# Patient Record
Sex: Male | Born: 1985 | Race: White | Hispanic: No | Marital: Single | Smoking: Never smoker
Health system: Southern US, Community
[De-identification: ages and names within clinical notes are randomized; demographics above are authoritative.]

## PROBLEM LIST (undated history)

## (undated) DIAGNOSIS — E785 Hyperlipidemia, unspecified: Secondary | ICD-10-CM

## (undated) DIAGNOSIS — R011 Cardiac murmur, unspecified: Secondary | ICD-10-CM

## (undated) DIAGNOSIS — E039 Hypothyroidism, unspecified: Secondary | ICD-10-CM

## (undated) DIAGNOSIS — T8859XA Other complications of anesthesia, initial encounter: Secondary | ICD-10-CM

## (undated) DIAGNOSIS — E876 Hypokalemia: Secondary | ICD-10-CM

## (undated) DIAGNOSIS — J309 Allergic rhinitis, unspecified: Secondary | ICD-10-CM

## (undated) DIAGNOSIS — R569 Unspecified convulsions: Secondary | ICD-10-CM

## (undated) DIAGNOSIS — R51 Headache: Secondary | ICD-10-CM

## (undated) DIAGNOSIS — I4891 Unspecified atrial fibrillation: Secondary | ICD-10-CM

## (undated) DIAGNOSIS — H5060 Mechanical strabismus, unspecified: Secondary | ICD-10-CM

## (undated) DIAGNOSIS — R519 Headache, unspecified: Secondary | ICD-10-CM

## (undated) DIAGNOSIS — F70 Mild intellectual disabilities: Secondary | ICD-10-CM

## (undated) DIAGNOSIS — G4730 Sleep apnea, unspecified: Secondary | ICD-10-CM

## (undated) DIAGNOSIS — E348 Other specified endocrine disorders: Secondary | ICD-10-CM

## (undated) DIAGNOSIS — I059 Rheumatic mitral valve disease, unspecified: Secondary | ICD-10-CM

## (undated) DIAGNOSIS — T4145XA Adverse effect of unspecified anesthetic, initial encounter: Secondary | ICD-10-CM

## (undated) DIAGNOSIS — I8290 Acute embolism and thrombosis of unspecified vein: Secondary | ICD-10-CM

## (undated) HISTORY — DX: Mild intellectual disabilities: F70

## (undated) HISTORY — DX: Headache: R51

## (undated) HISTORY — DX: Unspecified atrial fibrillation: I48.91

## (undated) HISTORY — DX: Other specified endocrine disorders: E34.8

## (undated) HISTORY — DX: Rheumatic mitral valve disease, unspecified: I05.9

## (undated) HISTORY — DX: Headache, unspecified: R51.9

## (undated) HISTORY — PX: PORTACATH PLACEMENT: SHX2246

## (undated) HISTORY — DX: Hypothyroidism, unspecified: E03.9

## (undated) HISTORY — PX: EYE SURGERY: SHX253

## (undated) HISTORY — DX: Hyperlipidemia, unspecified: E78.5

## (undated) HISTORY — DX: Mechanical strabismus, unspecified: H50.60

## (undated) HISTORY — DX: Unspecified convulsions: R56.9

## (undated) HISTORY — DX: Allergic rhinitis, unspecified: J30.9

## (undated) HISTORY — PX: ANKLE SURGERY: SHX546

## (undated) HISTORY — PX: CATARACT EXTRACTION: SUR2

## (undated) HISTORY — PX: IMPLANTATION VAGAL NERVE STIMULATOR: SUR692

---
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
## 2006-01-01 ENCOUNTER — Observation Stay (HOSPITAL_COMMUNITY): Admission: EM | Admit: 2006-01-01 | Discharge: 2006-01-02 | Payer: Self-pay | Admitting: Emergency Medicine

## 2006-01-21 ENCOUNTER — Encounter: Payer: Self-pay | Admitting: Emergency Medicine

## 2006-03-25 ENCOUNTER — Inpatient Hospital Stay (HOSPITAL_COMMUNITY): Admission: EM | Admit: 2006-03-25 | Discharge: 2006-03-30 | Payer: Self-pay | Admitting: Emergency Medicine

## 2006-08-06 ENCOUNTER — Inpatient Hospital Stay (HOSPITAL_COMMUNITY): Admission: EM | Admit: 2006-08-06 | Discharge: 2006-08-07 | Payer: Self-pay | Admitting: Emergency Medicine

## 2006-09-25 ENCOUNTER — Ambulatory Visit: Payer: Self-pay | Admitting: Pediatrics

## 2006-09-25 ENCOUNTER — Inpatient Hospital Stay (HOSPITAL_COMMUNITY): Admission: AD | Admit: 2006-09-25 | Discharge: 2006-09-28 | Payer: Self-pay | Admitting: Pediatrics

## 2006-09-30 ENCOUNTER — Encounter: Payer: Self-pay | Admitting: Internal Medicine

## 2006-10-12 ENCOUNTER — Ambulatory Visit (HOSPITAL_COMMUNITY): Admission: RE | Admit: 2006-10-12 | Discharge: 2006-10-12 | Payer: Self-pay | Admitting: Pediatrics

## 2006-12-02 ENCOUNTER — Encounter: Payer: Self-pay | Admitting: Internal Medicine

## 2006-12-21 ENCOUNTER — Encounter: Payer: Self-pay | Admitting: Internal Medicine

## 2006-12-24 ENCOUNTER — Encounter: Payer: Self-pay | Admitting: Internal Medicine

## 2007-03-15 ENCOUNTER — Inpatient Hospital Stay (HOSPITAL_COMMUNITY): Admission: EM | Admit: 2007-03-15 | Discharge: 2007-03-16 | Payer: Self-pay | Admitting: Emergency Medicine

## 2007-03-16 ENCOUNTER — Inpatient Hospital Stay (HOSPITAL_COMMUNITY): Admission: AD | Admit: 2007-03-16 | Discharge: 2007-03-18 | Payer: Self-pay | Admitting: Pediatrics

## 2007-05-05 ENCOUNTER — Encounter: Payer: Self-pay | Admitting: Internal Medicine

## 2007-06-22 ENCOUNTER — Ambulatory Visit: Payer: Self-pay | Admitting: Internal Medicine

## 2007-06-22 DIAGNOSIS — I059 Rheumatic mitral valve disease, unspecified: Secondary | ICD-10-CM | POA: Insufficient documentation

## 2007-06-22 DIAGNOSIS — G40909 Epilepsy, unspecified, not intractable, without status epilepticus: Secondary | ICD-10-CM

## 2007-06-22 DIAGNOSIS — R569 Unspecified convulsions: Secondary | ICD-10-CM

## 2007-06-22 DIAGNOSIS — E039 Hypothyroidism, unspecified: Secondary | ICD-10-CM

## 2007-06-22 HISTORY — DX: Rheumatic mitral valve disease, unspecified: I05.9

## 2007-06-22 HISTORY — DX: Hypothyroidism, unspecified: E03.9

## 2007-06-22 HISTORY — DX: Unspecified convulsions: R56.9

## 2007-07-29 ENCOUNTER — Ambulatory Visit: Payer: Self-pay | Admitting: Internal Medicine

## 2007-07-29 DIAGNOSIS — J40 Bronchitis, not specified as acute or chronic: Secondary | ICD-10-CM

## 2007-09-17 ENCOUNTER — Telehealth: Payer: Self-pay | Admitting: Internal Medicine

## 2007-09-17 ENCOUNTER — Ambulatory Visit: Payer: Self-pay | Admitting: Internal Medicine

## 2007-09-19 ENCOUNTER — Emergency Department (HOSPITAL_COMMUNITY): Admission: EM | Admit: 2007-09-19 | Discharge: 2007-09-19 | Payer: Self-pay | Admitting: Emergency Medicine

## 2007-09-20 ENCOUNTER — Ambulatory Visit: Payer: Self-pay | Admitting: Family Medicine

## 2007-09-20 ENCOUNTER — Inpatient Hospital Stay (HOSPITAL_COMMUNITY): Admission: AD | Admit: 2007-09-20 | Discharge: 2007-09-27 | Payer: Self-pay | Admitting: Family Medicine

## 2007-09-20 ENCOUNTER — Encounter: Payer: Self-pay | Admitting: Family Medicine

## 2007-09-25 ENCOUNTER — Ambulatory Visit: Payer: Self-pay | Admitting: Infectious Disease

## 2007-09-29 ENCOUNTER — Encounter: Payer: Self-pay | Admitting: Internal Medicine

## 2007-10-04 ENCOUNTER — Encounter: Payer: Self-pay | Admitting: Internal Medicine

## 2008-01-31 ENCOUNTER — Encounter: Admission: RE | Admit: 2008-01-31 | Discharge: 2008-01-31 | Payer: Self-pay | Admitting: Pediatrics

## 2008-03-02 ENCOUNTER — Encounter: Payer: Self-pay | Admitting: Internal Medicine

## 2008-04-20 ENCOUNTER — Inpatient Hospital Stay (HOSPITAL_COMMUNITY): Admission: EM | Admit: 2008-04-20 | Discharge: 2008-04-22 | Payer: Self-pay | Admitting: Emergency Medicine

## 2008-04-20 ENCOUNTER — Encounter: Payer: Self-pay | Admitting: Internal Medicine

## 2008-04-20 ENCOUNTER — Ambulatory Visit: Payer: Self-pay | Admitting: Pediatrics

## 2008-04-27 ENCOUNTER — Telehealth: Payer: Self-pay | Admitting: Internal Medicine

## 2008-04-27 ENCOUNTER — Encounter: Payer: Self-pay | Admitting: Internal Medicine

## 2008-07-11 ENCOUNTER — Ambulatory Visit: Payer: Self-pay | Admitting: Internal Medicine

## 2008-07-13 ENCOUNTER — Emergency Department (HOSPITAL_COMMUNITY): Admission: EM | Admit: 2008-07-13 | Discharge: 2008-07-14 | Payer: Self-pay | Admitting: Emergency Medicine

## 2008-11-18 ENCOUNTER — Inpatient Hospital Stay (HOSPITAL_COMMUNITY): Admission: EM | Admit: 2008-11-18 | Discharge: 2008-11-20 | Payer: Self-pay | Admitting: Emergency Medicine

## 2008-11-18 ENCOUNTER — Ambulatory Visit: Payer: Self-pay | Admitting: Internal Medicine

## 2008-12-07 ENCOUNTER — Ambulatory Visit: Payer: Self-pay | Admitting: Internal Medicine

## 2008-12-07 ENCOUNTER — Encounter: Payer: Self-pay | Admitting: Family Medicine

## 2008-12-18 ENCOUNTER — Ambulatory Visit: Payer: Self-pay | Admitting: Internal Medicine

## 2008-12-18 DIAGNOSIS — J069 Acute upper respiratory infection, unspecified: Secondary | ICD-10-CM | POA: Insufficient documentation

## 2009-01-01 ENCOUNTER — Ambulatory Visit: Payer: Self-pay | Admitting: Internal Medicine

## 2009-03-05 ENCOUNTER — Telehealth: Payer: Self-pay | Admitting: Internal Medicine

## 2009-04-16 ENCOUNTER — Ambulatory Visit: Payer: Self-pay | Admitting: Internal Medicine

## 2009-04-16 ENCOUNTER — Inpatient Hospital Stay (HOSPITAL_COMMUNITY): Admission: EM | Admit: 2009-04-16 | Discharge: 2009-04-21 | Payer: Self-pay | Admitting: Emergency Medicine

## 2009-04-16 ENCOUNTER — Ambulatory Visit: Payer: Self-pay | Admitting: Cardiology

## 2009-04-20 ENCOUNTER — Encounter (INDEPENDENT_AMBULATORY_CARE_PROVIDER_SITE_OTHER): Payer: Self-pay | Admitting: Internal Medicine

## 2009-04-22 ENCOUNTER — Telehealth: Payer: Self-pay | Admitting: Internal Medicine

## 2009-06-29 ENCOUNTER — Encounter: Payer: Self-pay | Admitting: Internal Medicine

## 2009-09-11 ENCOUNTER — Telehealth: Payer: Self-pay | Admitting: Internal Medicine

## 2009-09-11 ENCOUNTER — Inpatient Hospital Stay (HOSPITAL_COMMUNITY): Admission: EM | Admit: 2009-09-11 | Discharge: 2009-09-15 | Payer: Self-pay | Admitting: Emergency Medicine

## 2009-09-11 ENCOUNTER — Ambulatory Visit: Payer: Self-pay | Admitting: Internal Medicine

## 2009-09-11 LAB — CONVERTED CEMR LAB: Carbamazepine Lvl: 11.3 ug/mL (ref 4.0–12.0)

## 2009-09-12 ENCOUNTER — Telehealth: Payer: Self-pay | Admitting: Internal Medicine

## 2009-09-12 ENCOUNTER — Ambulatory Visit: Payer: Self-pay | Admitting: Internal Medicine

## 2009-09-12 LAB — CONVERTED CEMR LAB
ALT: 24 units/L (ref 0–53)
AST: 23 units/L (ref 0–37)
BUN: 9 mg/dL (ref 6–23)
Basophils Relative: 0.6 % (ref 0.0–3.0)
Bilirubin, Direct: 0 mg/dL (ref 0.0–0.3)
Calcium: 8.8 mg/dL (ref 8.4–10.5)
Chloride: 109 meq/L (ref 96–112)
Creatinine, Ser: 0.9 mg/dL (ref 0.4–1.5)
Eosinophils Relative: 2.4 % (ref 0.0–5.0)
GFR calc non Af Amer: 112.13 mL/min (ref >60)
Lymphocytes Relative: 24.5 % (ref 12.0–46.0)
Monocytes Absolute: 0.4 10*3/uL (ref 0.1–1.0)
Monocytes Relative: 6 % (ref 3.0–12.0)
Neutrophils Relative %: 66.5 % (ref 43.0–77.0)
Platelets: 275 10*3/uL (ref 150.0–400.0)
RBC: 4.65 M/uL (ref 4.22–5.81)
TSH: 0.04 microintl units/mL — ABNORMAL LOW (ref 0.35–5.50)
Total Bilirubin: 0.7 mg/dL (ref 0.3–1.2)
WBC: 6.5 10*3/uL (ref 4.5–10.5)

## 2009-09-13 ENCOUNTER — Encounter: Payer: Self-pay | Admitting: Internal Medicine

## 2009-09-27 ENCOUNTER — Ambulatory Visit: Payer: Self-pay | Admitting: Internal Medicine

## 2009-10-26 ENCOUNTER — Telehealth: Payer: Self-pay | Admitting: Internal Medicine

## 2009-10-30 ENCOUNTER — Ambulatory Visit: Payer: Self-pay | Admitting: Internal Medicine

## 2009-10-30 DIAGNOSIS — K140 Glossitis: Secondary | ICD-10-CM

## 2009-12-13 ENCOUNTER — Observation Stay (HOSPITAL_COMMUNITY): Admission: EM | Admit: 2009-12-13 | Discharge: 2009-12-13 | Payer: Self-pay | Admitting: Emergency Medicine

## 2009-12-26 ENCOUNTER — Ambulatory Visit: Payer: Self-pay | Admitting: Family Medicine

## 2009-12-26 DIAGNOSIS — J309 Allergic rhinitis, unspecified: Secondary | ICD-10-CM

## 2009-12-26 HISTORY — DX: Allergic rhinitis, unspecified: J30.9

## 2010-01-03 ENCOUNTER — Ambulatory Visit: Payer: Self-pay | Admitting: Internal Medicine

## 2010-01-23 ENCOUNTER — Encounter: Payer: Self-pay | Admitting: Internal Medicine

## 2010-02-07 ENCOUNTER — Encounter: Payer: Self-pay | Admitting: Internal Medicine

## 2010-02-08 ENCOUNTER — Telehealth: Payer: Self-pay

## 2010-02-08 ENCOUNTER — Telehealth: Payer: Self-pay | Admitting: Internal Medicine

## 2010-04-09 ENCOUNTER — Ambulatory Visit: Payer: Self-pay | Admitting: Internal Medicine

## 2010-04-09 DIAGNOSIS — R197 Diarrhea, unspecified: Secondary | ICD-10-CM

## 2010-08-06 ENCOUNTER — Ambulatory Visit: Payer: Self-pay | Admitting: Internal Medicine

## 2010-08-06 DIAGNOSIS — R040 Epistaxis: Secondary | ICD-10-CM

## 2010-09-10 ENCOUNTER — Telehealth: Payer: Self-pay | Admitting: Internal Medicine

## 2010-09-11 ENCOUNTER — Ambulatory Visit: Payer: Self-pay | Admitting: Internal Medicine

## 2010-10-17 ENCOUNTER — Telehealth: Payer: Self-pay | Admitting: Internal Medicine

## 2010-10-22 NOTE — Medication Information (Signed)
Summary: Refill Authorization Request for Clarinex  Refill Authorization Request for Clarinex   Imported By: Maryln Gottron 02/25/2010 12:24:35  _____________________________________________________________________  External Attachment:    Type:   Image     Comment:   External Document

## 2010-10-22 NOTE — Assessment & Plan Note (Signed)
Summary: SORE ON TONGUE // RS   Vital Signs:  Patient profile:   25 year old male Weight:      87 pounds Temp:     97.9 degrees F oral BP sitting:   100 / 64  (left arm) Cuff size:   regular  Vitals Entered By: Raechel Ache, RN (October 30, 2009 3:58 PM) CC: c/o sores in mouth , diarrhea / blood in stool resolving from last week   CC:  c/o sores in mouth  and diarrhea / blood in stool resolving from last week.  History of Present Illness: 25 year old patient with history of a chronic convulsive disorder, who is in today with a chief complaint of discomfort involving his tongue.  He is had some recent diarrhea, which has resolved.  He is hypothyroidism, and is scheduled for a follow-up TSH.  His seizure disorder has been stable but still has frequent seizures throughout the week.  No fever.    Allergies: 1)  ! Depakote (Divalproex Sodium) 2)  ! * Antihistamines 3)  ! Dilantin Infatabs (Phenytoin)  Past History:  Past Medical History: Reviewed history from 09/27/2009 and no changes required. 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 [name] insertion vagal nerve stimulator insertion status post VNS battery placement March 2007  Review of Systems  The patient denies anorexia, fever, weight loss, weight gain, vision loss, decreased hearing, hoarseness, chest pain, syncope, dyspnea on exertion, peripheral edema, prolonged cough, headaches, hemoptysis, abdominal pain, melena, hematochezia, severe indigestion/heartburn, hematuria, incontinence, genital sores, muscle weakness, suspicious skin lesions, transient blindness, difficulty walking, depression, unusual weight change, abnormal bleeding, enlarged lymph nodes, angioedema, breast masses, and testicular masses.     Physical Exam  General:  underweight appearing.  alert no distress.  Low-normal blood pressure Head:  Normocephalic and atraumatic without obvious abnormalities. No apparent alopecia or balding. Eyes:  No corneal or conjunctival inflammation noted. EOMI. Perrla. Funduscopic exam benign, without hemorrhages, exudates or papilledema. Vision grossly normal. Ears:  External ear exam shows no significant lesions or deformities.  Otoscopic examination reveals clear canals, tympanic membranes are intact bilaterally without bulging, retraction, inflammation or discharge. Hearing is grossly normal bilaterally. Mouth:  glossitis.  oropharynx clear Chest Wall:  No deformities, masses, tenderness or gynecomastia noted. Lungs:  Normal respiratory effort, chest expands symmetrically. Lungs are clear to auscultation, no crackles or wheezes. Heart:  Normal rate and regular rhythm. S1 and S2 normal without gallop, murmur, click, rub or other extra sounds. Abdomen:  Bowel sounds positive,abdomen soft and non-tender without masses, organomegaly or hernias noted.   Impression & Recommendations:  Problem # 1:  HYPOTHYROIDISM (ICD-244.9)  His updated medication list for this problem includes:    Synthroid 50 Mcg Tabs (Levothyroxine sodium) ..... One daily    His updated medication list for this problem includes:    Synthroid 50 Mcg Tabs (Levothyroxine sodium) ..... One daily  Orders: Venipuncture (16109) TLB-TSH (Thyroid Stimulating Hormone) (84443-TSH)  Problem # 2:  GLOSSITIS (ICD-529.0)  Complete Medication List: 1)  Tegretol 250 Mg Tabs (carbamazepine)  .Marland Kitchen.. 1 three times a day 2)  Topamax 150 Mg Tabs (topiramate)  .Marland Kitchen.. 1 three times a day 3)  Clarinex 5 Mg Tabs (Desloratadine) .Marland Kitchen.. 1 once daily 4)  Klor-con 20 Meq Pack (Potassium chloride) .Marland Kitchen.. 1 qid 5)  K-phos 500 Mg Tabs (  Potassium phosphate monobasic) .Marland Kitchen.. 1 qid 6)  Promethazine Hcl 25 Mg Tabs (Promethazine hcl) .... One every 6 hours as  needed for nausea 7)  Hydrocodone-acetaminophen 5-500 Mg Tabs (Hydrocodone-acetaminophen) .... One every 6 hours as needed for pain or cough 8)  Ranitidine Hcl 150 Mg Caps (Ranitidine hcl) .Marland Kitchen.. 1 once daily 9)  Synthroid 50 Mcg Tabs (Levothyroxine sodium) .... One daily 10)  Vimpat 50 Mg Tabs (Lacosamide) .... Bid  Patient Instructions: 1)  Please schedule a follow-up appointment in 3 months.

## 2010-10-22 NOTE — Assessment & Plan Note (Signed)
Summary: pt had a seizure this a.m./pt fell and hit nose/nose is swoll...   Vital Signs:  Patient profile:   25 year old male Weight:      97 pounds Temp:     97.4 degrees F oral BP sitting:   88 / 60  (right arm) Cuff size:   regular  Vitals Entered By: Duard Brady LPN (August 06, 2010 3:38 PM) CC: seizure this Am  Is Patient Diabetic? No Flu Vaccine Consent Questions     Do you have a history of severe allergic reactions to this vaccine? no    Any prior history of allergic reactions to egg and/or gelatin? no    Do you have a sensitivity to the preservative Thimersol? no    Do you have a past history of Guillan-Barre Syndrome? no    Do you currently have an acute febrile illness? no    Have you ever had a severe reaction to latex? no    Vaccine information given and explained to patient? yes    Are you currently pregnant? no    Lot Number:[id number]   Exp Date:03/22/2011   Site Given  Left Deltoid IM   CC:  seizure this Am .  History of Present Illness: 25 year old patient who has a chronic convulsive disorder, who had an acute seizure at approximately 815 earlier this morning.  He sustained some trauma to his nose at that time and there was brief self-limited epistaxis.  He complains of some minimal swelling and pain involving the bridge of his nose. He has a history of hypothyroidism and allergic rhinitis, which have been stable.  He remains on Synthroid 50 micrograms daily  Allergies: 1)  ! Depakote (Divalproex Sodium) 2)  ! * Antihistamines 3)  ! Dilantin Infatabs (Phenytoin)  Past History:  Past Medical History: Reviewed history from 01/03/2010 and no changes required. 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 No apparent alopecia or balding. Eyes:  No corneal or conjunctival inflammation noted. EOMI. Perrla. Funduscopic exam benign, without hemorrhages, exudates or papilledema. Vision grossly normal. Ears:  External ear exam shows no significant lesions or deformities.  Otoscopic examination reveals clear canals, tympanic membranes are intact bilaterally without bulging, retraction, inflammation or discharge. Hearing is grossly normal bilaterally. Nose:  soft tissue swelling and early ecchymosis over the bridge of the nose.  No misalignment; no tenderness with gentle palpation over the bridge of the nose Mouth:  Oral mucosa and oropharynx without lesions or exudates.  Teeth in good repair. Neck:  No deformities, masses, or tenderness noted. Lungs:  Normal respiratory effort, chest expands symmetrically. Lungs are clear to auscultation, no crackles or wheezes. Heart:  Normal rate and regular rhythm. S1 and S2 normal without gallop, murmur, click, rub or other extra sounds.   Impression & Recommendations:  Problem # 1:  EPISTAXIS (ICD-784.7) this has resolved; the nasal trauma appears mild doubt nasal fracture.  Will clinically observe at this time  Problem # 2:  SEIZURE DISORDER (ICD-780.39)  His updated medication list for this problem includes:    Vimpat 10 Mg/ml Soln (Lacosamide) .Marland Kitchen... 2 and 1/2 cc three times a day  Complete Medication List: 1)  Tegretol 250 Mg Tabs (carbamazepine)  .Marland Kitchen.. 1 three times a day 2)  Topamax  150 Mg Tabs (topiramate)  .Marland Kitchen.. 1 three times a day 3)  Clarinex 5 Mg Tabs (Desloratadine) .Marland Kitchen.. 1 once daily 4)  Klor-con 20 Meq Pack (Potassium chloride) .Marland Kitchen.. 1 qid 5)  K-phos 500 Mg Tabs (Potassium phosphate monobasic) .Marland Kitchen.. 1 qid 6)  Promethazine Hcl 25 Mg Tabs (Promethazine hcl) .... One every 6 hours as needed for nausea 7)  Hydrocodone-acetaminophen 5-500 Mg Tabs  (Hydrocodone-acetaminophen) .... One every 6 hours as needed for pain or cough 8)  Ranitidine Hcl 150 Mg Caps (Ranitidine hcl) .Marland Kitchen.. 1 once daily 9)  Synthroid 50 Mcg Tabs (Levothyroxine sodium) .... One daily 10)  Vimpat 10 Mg/ml Soln (Lacosamide) .... 2 and 1/2 cc three times a day 11)  Singulair 10 Mg Tabs (Montelukast sodium) .... One by mouth once daily 12)  Fluticasone Propionate 50 Mcg/act Susp (Fluticasone propionate) .... One puff each nares once daily  Other Orders: Admin 1st Vaccine (16109) Flu Vaccine 34yrs + (60454)  Patient Instructions: 1)  apply ice to the bridge of the nose until bedtime 2)  call if increasing pain or significant swelling or difficulty breathing through the nose 3)  Please schedule a follow-up appointment in 3 months.   Orders Added: 1)  Admin 1st Vaccine [90471] 2)  Flu Vaccine 25yrs + [90658] 3)  Est. Patient Level IV [09811]

## 2010-10-22 NOTE — Assessment & Plan Note (Signed)
Summary: hospital follow up/cjr   Vital Signs:  Patient profile:   25 year old male Weight:      87 pounds Pulse rate:   88 / minute Pulse rhythm:   regular BP sitting:   88 / 60  (left arm) Cuff size:   regular  Vitals Entered By: Raechel Ache, RN (September 27, 2009 1:22 PM) [facility]: Hospital f/u. Feels well. Is Patient Diabetic? No   CC:  Hospital f/u. Feels well.Marland Kitchen  History of Present Illness: 25 year old patient has a history of a chronic seizure disorder.  He was discharged from the hospital on Christmas day following recurrent status epilepticus.  Since his discharge he has had 3 major seizures.  He is scheduled for neurology follow-up tomorrow morning.  TSH was suppressed.  Prior to his [facility].  This was repeated during his hospital admission and was still low.  His mother states that on her lower Synthroid dose in the past.  He did not do quite as well.  Allergies: 1)  ! Depakote (Divalproex Sodium) 2)  ! * Antihistamines 3)  ! 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 no family history of seizures family history migraines, diabetes, and hypertension  Review of Systems  The patient denies anorexia, fever, weight loss, weight gain, vision loss, decreased hearing, hoarseness, chest pain, syncope, dyspnea on exertion, peripheral edema, prolonged cough, headaches, hemoptysis, abdominal pain, melena, hematochezia, severe indigestion/heartburn, hematuria, incontinence, genital sores, muscle weakness, suspicious skin lesions, transient blindness, difficulty walking, depression, unusual weight change, abnormal bleeding, enlarged lymph nodes, angioedema, breast masses, and testicular masses.    Physical  Exam  General:  underweight appearing.  alert no distress; underweight appearing.   Head:  Normocephalic and atraumatic without obvious abnormalities. No apparent alopecia or balding. Eyes:  No corneal or conjunctival inflammation noted. EOMI. Perrla. Funduscopic exam benign, without hemorrhages, exudates or papilledema. Vision grossly normal. Mouth:  Oral mucosa and oropharynx without lesions or exudates.  Teeth in good repair. Neck:  No deformities, masses, or tenderness noted. Lungs:  Normal respiratory effort, chest expands symmetrically. Lungs are clear to auscultation, no crackles or wheezes. Heart:  grade 3/6 systolic murmur Abdomen:  Bowel sounds positive,abdomen soft and non-tender without masses, organomegaly or hernias noted.   Impression & Recommendations:  Problem # 1:  HYPOTHYROIDISM (ICD-244.9)  The following medications were removed from the medication list:    Synthroid 75 Mcg Tabs (Levothyroxine sodium) .Marland Kitchen... 1 once daily His updated medication list for this problem includes:    Synthroid 50 Mcg Tabs (Levothyroxine sodium) ..... One daily will decrease his Synthroid to 50 micrograms daily, and repeat a TSH in 6 weeks  The following medications were removed from the medication list:    Synthroid 75 Mcg Tabs (Levothyroxine sodium) .Marland Kitchen... 1 once daily His updated medication list for this problem includes:    Synthroid 50 Mcg Tabs (Levothyroxine sodium) ..... One daily  Problem # 2:  MITRAL VALVE PROLAPSE (ICD-424.0)  Problem # 3:  SEIZURE DISORDER (ICD-780.39)  The following medications were removed from the medication list:    Vimpat 100 Mg Tabs (Lacosamide) .Marland Kitchen... 1 two times a day  The following medications were removed from the medication list:    Vimpat 100 Mg Tabs (Lacosamide) .Marland Kitchen... 1 two times a  day  Complete Medication List: 1)  Tegretol 250 Mg Tabs (carbamazepine)  .Marland Kitchen.. 1 three times a day 2)  Topamax 150 Mg Tabs (topiramate)  .Marland Kitchen.. 1 three times a day 3)   Clarinex 5 Mg Tabs (Desloratadine) .Marland Kitchen.. 1 once daily 4)  Klor-con 20 Meq Pack (Potassium chloride) .Marland Kitchen.. 1 qid 5)  K-phos 500 Mg Tabs (Potassium phosphate monobasic) .Marland Kitchen.. 1 qid 6)  Promethazine Hcl 25 Mg Tabs (Promethazine hcl) .... One every 6 hours as needed for nausea 7)  Hydrocodone-acetaminophen 5-500 Mg Tabs (Hydrocodone-acetaminophen) .... One every 6 hours as needed for pain or cough 8)  Ranitidine Hcl 150 Mg Caps (Ranitidine hcl) .Marland Kitchen.. 1 once daily 9)  Synthroid 50 Mcg Tabs (Levothyroxine sodium) .... One daily  Patient Instructions: 1)  Please schedule a follow-up appointment in 3 months. 2)  neurology follow up in the morning as scheduled 3)  TSH in 6 weeks (244.9) Prescriptions: SYNTHROID 50 MCG TABS (LEVOTHYROXINE SODIUM) one daily  #90 x 6   Entered and Authorized by:   Gordy Savers  MD   Signed by:   Gordy Savers  MD on 09/27/2009   Method used:   Print then Give to Patient   RxID:   [id number] SYNTHROID 50 MCG TABS (LEVOTHYROXINE SODIUM) one daily  #90 x 6   Entered and Authorized by:   Gordy Savers  MD   Signed by:   Gordy Savers  MD on 09/27/2009   Method used:   Electronically to        [facility]  [phone]* (retail)       [address]       [place], [place]  [address]       Ph: [phone] or [phone]       Fax: [phone]   RxID:   [id number]

## 2010-10-22 NOTE — Assessment & Plan Note (Signed)
Summary: 3 month roa//lh   Vital Signs:  Patient profile:   25 year old male Weight:      90 pounds Temp:     97.6 degrees F oral BP sitting:   100 / 70  (right arm) Cuff size:   regular  Vitals Entered By: Duard Brady LPN (January 03, 2010 1:04 PM) CC: 3 mos rov - doing ok  still with allergy sx - saw Dr. Docia Furl 2 wks ago Is Patient Diabetic? No   CC:  3 mos rov - doing ok  still with allergy sx - saw Dr. Docia Furl 2 wks ago.  History of Present Illness: 25 year old patient who has a history of allergic rhinitis.  Has a history of chronic seizure disorder, hypothyroidism.  He has been reasonably stable.  His main complaint today are allergy related mainly sinus congestion and rhinorrhea.  He has been on Clarinex for some time and Singulair recently added to his regimen.  He has been resistant to trying nasal steroids in the past  Allergies: 1)  ! Depakote (Divalproex Sodium) 2)  ! * Antihistamines 3)  ! 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  low-normal blood pressureunderweight appearing.   Head:  Normocephalic and atraumatic without obvious abnormalities. No apparent alopecia or balding. Eyes:  No corneal or conjunctival inflammation noted. EOMI. Perrla. Funduscopic exam benign, without hemorrhages, exudates or papilledema. Vision grossly normal. Ears:  External ear exam shows no significant lesions or deformities.  Otoscopic examination reveals clear canals, tympanic membranes are intact bilaterally without bulging, retraction, inflammation or discharge. Hearing is grossly normal bilaterally. Nose:  External nasal examination shows no deformity or inflammation. Nasal mucosa are pink and moist without lesions or  exudates. Mouth:  Oral mucosa and oropharynx without lesions or exudates.  Teeth in good repair. Neck:  No deformities, masses, or tenderness noted. Lungs:  Normal respiratory effort, chest expands symmetrically. Lungs are clear to auscultation, no crackles or wheezes. Heart:  Normal rate and regular rhythm. S1 and S2 normal without gallop, murmur, click, rub or other extra sounds. Abdomen:  Bowel sounds positive,abdomen soft and non-tender without masses, organomegaly or hernias noted.   Impression & Recommendations:  Problem # 1:  ALLERGIC RHINITIS (ICD-477.9)  His updated medication list for this problem includes:    Clarinex 5 Mg Tabs (Desloratadine) .Marland Kitchen... 1 once daily    Promethazine Hcl 25 Mg Tabs (Promethazine hcl) ..... One every 6 hours as needed for nausea    Fluticasone Propionate 50 Mcg/act Susp (Fluticasone propionate) ..... One puff each nares once daily  His updated medication list for this problem includes:    Clarinex 5 Mg Tabs (Desloratadine) .Marland Kitchen... 1 once daily    Promethazine Hcl 25 Mg Tabs (Promethazine hcl) ..... One every 6 hours as needed for nausea    Fluticasone Propionate 50 Mcg/act Susp (Fluticasone propionate) ..... One puff each nares once daily  Problem # 2:  HYPOTHYROIDISM (ICD-244.9)  His updated medication list for this problem includes:    Synthroid 50 Mcg Tabs (Levothyroxine sodium) ..... One daily  His updated medication list for this problem includes:    Synthroid 50 Mcg Tabs (Levothyroxine sodium) ..... One daily  Problem # 3:  SEIZURE DISORDER (ICD-780.39)  His updated medication list for this problem includes:    Vimpat  10 Mg/ml Soln (Lacosamide) .Marland Kitchen... 2 and 1/2 cc three times a day  His updated medication list for this problem includes:    Vimpat 10 Mg/ml Soln (Lacosamide) .Marland Kitchen... 2 and 1/2 cc three times a day  Complete Medication List: 1)  Tegretol 250 Mg Tabs (carbamazepine)  .Marland Kitchen.. 1 three times a day 2)  Topamax 150 Mg Tabs  (topiramate)  .Marland Kitchen.. 1 three times a day 3)  Clarinex 5 Mg Tabs (Desloratadine) .Marland Kitchen.. 1 once daily 4)  Klor-con 20 Meq Pack (Potassium chloride) .Marland Kitchen.. 1 qid 5)  K-phos 500 Mg Tabs (Potassium phosphate monobasic) .Marland Kitchen.. 1 qid 6)  Promethazine Hcl 25 Mg Tabs (Promethazine hcl) .... One every 6 hours as needed for nausea 7)  Hydrocodone-acetaminophen 5-500 Mg Tabs (Hydrocodone-acetaminophen) .... One every 6 hours as needed for pain or cough 8)  Ranitidine Hcl 150 Mg Caps (Ranitidine hcl) .Marland Kitchen.. 1 once daily 9)  Synthroid 50 Mcg Tabs (Levothyroxine sodium) .... One daily 10)  Vimpat 10 Mg/ml Soln (Lacosamide) .... 2 and 1/2 cc three times a day 11)  Singulair 10 Mg Tabs (Montelukast sodium) .... One by mouth once daily 12)  Fluticasone Propionate 50 Mcg/act Susp (Fluticasone propionate) .... One puff each nares once daily  Patient Instructions: 1)  trial of Zyrtec or Allegra 2)  fluticasone nasal spray once daily 3)  Please schedule a follow-up appointment in 3 months. Prescriptions: FLUTICASONE PROPIONATE 50 MCG/ACT SUSP (FLUTICASONE PROPIONATE) one puff each nares once daily  #3 x 3   Entered and Authorized by:   Gordy Savers  MD   Signed by:   Gordy Savers  MD on 01/03/2010   Method used:   Print then Give to Patient   RxID:   [id number]

## 2010-10-22 NOTE — Progress Notes (Signed)
  Phone Note Call from Patient   Caller: Mom Call For: Gordy Savers  MD Summary of Call: c/o diarrhea x1wk, today very frequent, stool black and noting blood. Instructed mom to place on clear liquids until I can speak with Dr. Amador Cunas. cell [phone]  KIK  Follow-up for Phone Call        suggest ROV today or sat am clinic if unimproved Follow-up by: Gordy Savers  MD,  October 26, 2009 12:46 PM  Additional Follow-up for Phone Call Additional follow up Details #1::        rtc to mom - per Dr. Amador Cunas - saturaday clinic if no improvement - do clear liquids then brat diet. To Er if increase in notied blood or fever .Sat clinic location , hours and ph number given. Verbalized understanding.  KIK

## 2010-10-22 NOTE — Assessment & Plan Note (Signed)
Summary: ALLERGIES? // RS   Vital Signs:  Patient profile:   25 year old male Temp:     98.3 degrees F oral BP sitting:   110 / 70  (left arm) Cuff size:   regular  Vitals Entered By: Sid Falcon LPN (December 26, 452 2:45 PM) CC: runny nose, sneezing, coughing X 2 weeks   History of Present Illness: Patient seen with 3-4 history of nasal congestion, sneezing, intermittent cough but no fever. No purulent nasal secretions. Question of seasonal allergies in the spring. Using Clarinex 5 mg daily without much relief.  No hx asthma.  Allergies: 1)  ! Depakote (Divalproex Sodium) 2)  ! * Antihistamines 3)  ! 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 Ears:  External ear exam shows no significant lesions or deformities.  Otoscopic examination reveals clear canals, tympanic membranes are intact bilaterally without bulging, retraction, inflammation or discharge. Hearing is grossly normal bilaterally. Nose:  clear nasal mucus bilaterally Mouth:  Oral mucosa and oropharynx without lesions or exudates.  Teeth in good repair. Neck:  No deformities, masses, or tenderness noted. Lungs:  Normal respiratory effort, chest expands symmetrically. Lungs are clear to auscultation, no crackles or wheezes. Heart:  normal rate and regular rhythm.     Impression & Recommendations:  Problem # 1:  ALLERGIC RHINITIS (ICD-477.9) start Singulair (samples given) and they will try alternative antihistamine such as Allegra His updated medication list for this problem includes:    Clarinex 5 Mg Tabs (Desloratadine) .Marland Kitchen... 1 once daily    Promethazine Hcl 25 Mg Tabs  (Promethazine hcl) ..... One every 6 hours as needed for nausea  Complete Medication List: 1)  Tegretol 250 Mg Tabs (carbamazepine)  .Marland Kitchen.. 1 three times a day 2)  Topamax 150 Mg Tabs (topiramate)  .Marland Kitchen.. 1 three times a day 3)  Clarinex 5 Mg Tabs (Desloratadine) .Marland Kitchen.. 1 once daily 4)  Klor-con 20 Meq Pack (Potassium chloride) .Marland Kitchen.. 1 qid 5)  K-phos 500 Mg Tabs (Potassium phosphate monobasic) .Marland Kitchen.. 1 qid 6)  Promethazine Hcl 25 Mg Tabs (Promethazine hcl) .... One every 6 hours as needed for nausea 7)  Hydrocodone-acetaminophen 5-500 Mg Tabs (Hydrocodone-acetaminophen) .... One every 6 hours as needed for pain or cough 8)  Ranitidine Hcl 150 Mg Caps (Ranitidine hcl) .Marland Kitchen.. 1 once daily 9)  Synthroid 50 Mcg Tabs (Levothyroxine sodium) .... One daily 10)  Vimpat 10 Mg/ml Soln (Lacosamide) .... 2 and 1/2 cc three times a day 11)  Singulair 10 Mg Tabs (Montelukast sodium) .... One by mouth once daily  Patient Instructions: 1)  Start Singulair 10 mg one tablet daily 2)  Consider alternative antihistamine such as Allegra or Zyrtec one tablet daily

## 2010-10-22 NOTE — Progress Notes (Signed)
Summary: trail and failure claritin and zyrtec - resubmit preauth  Phone Note Call from Patient   Caller: Mom Call For: Marcus Savers  MD Reason for Call: Talk to Nurse Summary of Call: [name] has been on claritin before rx'd clarinex - also is currently using Zyrtec without relief until we can get back on clarinex. So ,trial and failure on both Claritin and Zyrtec.  Initial call taken by: Duard Brady LPN,  Feb 08, 2010 4:26 PM

## 2010-10-22 NOTE — Progress Notes (Signed)
Summary: info needed for clarinex auth  Phone Note Outgoing Call   Call placed by: Duard Brady LPN,  Feb 08, 2010 11:37 AM Call placed to: mom Summary of Call: need pre auth for clarinex - need to know if he has every tried and failed zyrtec of claritin otc , if not he needs to try and if not working , call so we can document failure. Ok to leave msg on mach. KIK Initial call taken by: Duard Brady LPN,  Feb 08, 2010 11:38 AM

## 2010-10-22 NOTE — Assessment & Plan Note (Signed)
Summary: ? diarrhea//ccm   Vital Signs:  Patient profile:   25 year old male Weight:      93 pounds Temp:     97.8 degrees F oral BP sitting:   100 / 66  (left arm) Cuff size:   regular  Vitals Entered By: Duard Brady LPN (April 09, 2010 11:39 AM) CC: c/o diarrhea Is Patient Diabetic? No   CC:  c/o diarrhea.  History of Present Illness: 25 year old patient, who presents with a two to 3 week history of episodic loose, watery stool.  There is been no nausea, anorexia, or vomiting.  He denies any abdominal pain.  There's been no new medications or change in his diet.  Symptoms seem paroxysmal.  He has had no bowel movement at midday today.  Review his chart reveals a weight gain of 3 pounds since his last visit here.  His seizure disorder has been stable.  He has a history of mitral valve prolapse and treated hypothyroidism.  Preventive Screening-Counseling & Management  Alcohol-Tobacco     Smoking Status: never  Allergies: 1)  ! Depakote (Divalproex Sodium) 2)  ! * Antihistamines 3)  ! Dilantin Infatabs (Phenytoin)  Past History:  Past Medical History: Reviewed history from 01/03/2010 and no changes required. 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) Hypothyroidism Allergic rhinitis  Social History: Smoking Status:  never  Review of Systems  The patient denies anorexia, fever, weight loss, weight gain, vision loss, decreased hearing, hoarseness, chest pain, syncope, dyspnea on exertion, peripheral edema, prolonged cough, headaches, hemoptysis, abdominal pain, melena, hematochezia, severe indigestion/heartburn, hematuria, incontinence, genital sores, muscle weakness, suspicious skin lesions, transient blindness, difficulty walking, depression, unusual weight change, abnormal bleeding, enlarged lymph nodes, angioedema, breast masses, and testicular masses.     Physical Exam  General:  alert appropriate, no distress.  Blood pressure 100/60.  No tachycardia Head:  Normocephalic and atraumatic without obvious abnormalities. No apparent alopecia or balding. Eyes:  No corneal or conjunctival inflammation noted. EOMI. Perrla. Funduscopic exam benign, without hemorrhages, exudates or papilledema. Vision grossly normal. Mouth:  Oral mucosa and oropharynx without lesions or exudates.  Teeth in good repair. Neck:  No deformities, masses, or tenderness noted. Lungs:  Normal respiratory effort, chest expands symmetrically. Lungs are clear to auscultation, no crackles or wheezes. Heart:  grade 2 to 3/6 systolic murmur Abdomen:  soft flat, nontender, active bowel sounds.  No guarding or rebound.  No distention   Impression & Recommendations:  Problem # 1:  DIARRHEA (ICD-787.91)  Problem # 2:  HYPOTHYROIDISM (ICD-244.9)  His updated medication list for this problem includes:    Synthroid 50 Mcg Tabs (Levothyroxine sodium) ..... One daily  Problem # 3:  SEIZURE DISORDER (ICD-780.39)  His updated medication list for this problem includes:    Vimpat 10 Mg/ml Soln (Lacosamide) .Marland Kitchen... 2 and 1/2 cc three times a day  Complete Medication List: 1)  Tegretol 250 Mg Tabs (carbamazepine)  .Marland Kitchen.. 1 three times a day 2)  Topamax 150 Mg Tabs (topiramate)  .Marland Kitchen.. 1 three times a day 3)  Clarinex 5 Mg Tabs (Desloratadine) .Marland Kitchen.. 1 once daily 4)  Klor-con 20 Meq Pack (Potassium chloride) .Marland Kitchen.. 1 qid 5)  K-phos 500 Mg Tabs (Potassium phosphate monobasic) .Marland Kitchen.. 1 qid 6)  Promethazine Hcl 25 Mg Tabs (Promethazine hcl) .... One every 6 hours as needed for nausea 7)  Hydrocodone-acetaminophen 5-500 Mg Tabs (Hydrocodone-acetaminophen) .... One every 6  hours as needed for pain or cough 8)  Ranitidine Hcl 150 Mg Caps (Ranitidine hcl) .Marland Kitchen.. 1 once daily 9)  Synthroid 50 Mcg Tabs (Levothyroxine sodium) .... One daily 10)  Vimpat 10 Mg/ml Soln (Lacosamide) .... 2 and 1/2 cc three  times a day 11)  Singulair 10 Mg Tabs (Montelukast sodium) .... One by mouth once daily 12)  Fluticasone Propionate 50 Mcg/act Susp (Fluticasone propionate) .... One puff each nares once daily  Patient Instructions: 1)  align- one daily 2)  high fiber diet 3)  Drink as much fluid as you can tolerate for the next few days.

## 2010-10-22 NOTE — Letter (Signed)
Summary: [facility]  [facility]   Imported By: Lennie Odor 03/06/2010 16:49:06  _____________________________________________________________________  External Attachment:    Type:   Image     Comment:   External Document

## 2010-10-22 NOTE — Letter (Signed)
Summary: [facility]  [facility]   Imported By: Maryln Gottron 02/04/2010 10:08:45  _____________________________________________________________________  External Attachment:    Type:   Image     Comment:   External Document

## 2010-10-24 NOTE — Progress Notes (Signed)
Summary: sore throat---wants ov  Phone Note Call from Patient Call back at Home Phone [phone] Call back at [phone]   Caller: Mom---triage vm Reason for Call: Talk to Nurse Complaint: Cough/Sore throat Summary of Call: has a cough and sore throat. no fever. wants ov today. Initial call taken by: Warnell Forester,  September 10, 2010 9:44 AM  Follow-up for Phone Call        spoke with mom - sore throat , no fever . appt made for tomorrow AM Follow-up by: Duard Brady LPN,  September 10, 2010 1:26 PM

## 2010-10-24 NOTE — Progress Notes (Signed)
Summary: synthriod refill  Phone Note Refill Request Message from:  Fax from Pharmacy on October 17, 2010 4:24 PM  Refills Requested: Medication #1:  SYNTHROID 50 MCG TABS one daily [facility]   Method Requested: Fax to Local Pharmacy Initial call taken by: Duard Brady LPN,  October 17, 2010 4:25 PM    Prescriptions: SYNTHROID 50 MCG TABS (LEVOTHYROXINE SODIUM) one daily  #90 x 3   Entered by:   Duard Brady LPN   Authorized by:   Gordy Savers  MD   Signed by:   Duard Brady LPN on 16/06/9603   Method used:   Faxed to ...       [facility]  [address]  [phone]* (retail)       [address]       [place], [place]  [address]       Ph: [phone] or [phone]       Fax: [phone]   RxID:   [id number]

## 2010-10-24 NOTE — Assessment & Plan Note (Signed)
Summary: sore throat   kik   Vital Signs:  Patient profile:   25 year old male Weight:      95 pounds BP sitting:   102 / 60  (right arm) Cuff size:   regular  Vitals Entered By: Duard Brady LPN (September 11, 2010 11:06 AM) CC: c/o sore throat , head congestion, nonproductive cough Is Patient Diabetic? No   CC:  c/o sore throat , head congestion, and nonproductive cough.  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  For the past few days,   he has had mild sore throat, cough, but no documented fever.  He has had seizures the past two mornings.  He has a history of treated hypothyroidism, allergic rhinitis.  Maintenance medications, including Clarinex, and fluticasone  Allergies: 1)  ! Depakote (Divalproex Sodium) 2)  ! * Antihistamines 3)  ! Dilantin Infatabs (Phenytoin)  Past History:  Past Medical History: Reviewed history from 01/03/2010 and no changes required. 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 [name] insertion vagal nerve stimulator insertion status post VNS battery placement March 2007  Family History: Reviewed history from 06/22/2007 and no changes required. no family history of seizures family history migraines, diabetes, and hypertension  Review of Systems       The patient complains of anorexia, hoarseness, and prolonged cough.  The patient denies fever, weight loss, weight gain, vision loss, decreased hearing, chest pain, syncope, dyspnea on exertion, peripheral edema, headaches, hemoptysis, abdominal pain, melena, hematochezia, severe indigestion/heartburn, hematuria, incontinence, genital sores, muscle weakness, suspicious skin  lesions, transient blindness, difficulty walking, depression, unusual weight change, abnormal bleeding, enlarged lymph nodes, angioedema, breast masses, and testicular masses.    Physical Exam  General:  underweight appearing.   normal.  Blood pressure, afebrile, no distress Head:  Normocephalic and atraumatic without obvious abnormalities. No apparent alopecia or balding. Eyes:  No corneal or conjunctival inflammation noted. EOMI. Perrla. Funduscopic exam benign, without hemorrhages, exudates or papilledema. Vision grossly normal. Ears:  External ear exam shows no significant lesions or deformities.  Otoscopic examination reveals clear canals, tympanic membranes are intact bilaterally without bulging, retraction, inflammation or discharge. Hearing is grossly normal bilaterally. Mouth:  Oral mucosa and oropharynx without lesions or exudates.  mild erythema only.  No exudate Neck:  No deformities, masses, or tenderness noted. Lungs:  Normal respiratory effort, chest expands symmetrically. Lungs are clear to auscultation, no crackles or wheezes.  O2 saturation 98% Heart:  Normal rate and regular rhythm. S1 and S2 normal without gallop, murmur, click, rub or other extra sounds.  no tachycardia Abdomen:  Bowel sounds positive,abdomen soft and non-tender without masses, organomegaly or hernias noted. Msk:  No deformity or scoliosis noted of thoracic or lumbar spine.   Pulses:  R and L carotid,radial,femoral,dorsalis pedis and posterior tibial pulses are full and equal bilaterally Extremities:  No clubbing, cyanosis, edema, or deformity noted with normal full range of motion of all joints.     Impression & Recommendations:  Problem # 1:  ALLERGIC RHINITIS (ICD-477.9)  His updated medication list for this problem includes:    Clarinex 5 Mg Tabs (Desloratadine) .Marland Kitchen... 1 once daily    Promethazine Hcl 25 Mg Tabs (Promethazine hcl) ..... One every 6 hours as needed  for nausea    Fluticasone  Propionate 50 Mcg/act Susp (Fluticasone propionate) ..... One puff each nares once daily  Problem # 2:  HYPOTHYROIDISM (ICD-244.9)  His updated medication list for this problem includes:    Synthroid 50 Mcg Tabs (Levothyroxine sodium) ..... One daily  Problem # 3:  PHYSICAL EXAMINATION (ICD-V70.0)  Problem # 4:  SEIZURE DISORDER (ICD-780.39)  His updated medication list for this problem includes:    Vimpat 10 Mg/ml Soln (Lacosamide) .Marland Kitchen... 2 and 1/2 cc three times a day  Complete Medication List: 1)  Tegretol 250 Mg Tabs (carbamazepine)  .Marland Kitchen.. 1 three times a day 2)  Topamax 150 Mg Tabs (topiramate)  .Marland Kitchen.. 1 three times a day 3)  Clarinex 5 Mg Tabs (Desloratadine) .Marland Kitchen.. 1 once daily 4)  Klor-con 20 Meq Pack (Potassium chloride) .Marland Kitchen.. 1 qid 5)  K-phos 500 Mg Tabs (Potassium phosphate monobasic) .Marland Kitchen.. 1 qid 6)  Promethazine Hcl 25 Mg Tabs (Promethazine hcl) .... One every 6 hours as needed for nausea 7)  Hydrocodone-acetaminophen 5-500 Mg Tabs (Hydrocodone-acetaminophen) .... One every 6 hours as needed for pain or cough 8)  Ranitidine Hcl 150 Mg Caps (Ranitidine hcl) .Marland Kitchen.. 1 once daily 9)  Synthroid 50 Mcg Tabs (Levothyroxine sodium) .... One daily 10)  Vimpat 10 Mg/ml Soln (Lacosamide) .... 2 and 1/2 cc three times a day 11)  Singulair 10 Mg Tabs (Montelukast sodium) .... One by mouth once daily 12)  Fluticasone Propionate 50 Mcg/act Susp (Fluticasone propionate) .... One puff each nares once daily  Patient Instructions: 1)  Get plenty of rest, drink lots of clear liquids, and use Tylenol or Ibuprofen for fever and comfort. Return in 7-10 days if you're not better:sooner if you're feeling worse. Prescriptions: HYDROCODONE-ACETAMINOPHEN 5-500 MG TABS (HYDROCODONE-ACETAMINOPHEN) one every 6 hours as needed for pain or cough  #50 x 0   Entered and Authorized by:   Gordy Savers  MD   Signed by:   Gordy Savers  MD on 09/11/2010   Method used:   Print then Give to Patient   RxID:    [id number]    Orders Added: 1)  Est. Patient Level IV [56213]

## 2010-11-13 ENCOUNTER — Encounter: Payer: Self-pay | Admitting: Family Medicine

## 2010-11-13 ENCOUNTER — Ambulatory Visit (INDEPENDENT_AMBULATORY_CARE_PROVIDER_SITE_OTHER): Payer: Medicaid Other | Admitting: Family Medicine

## 2010-11-13 ENCOUNTER — Ambulatory Visit (INDEPENDENT_AMBULATORY_CARE_PROVIDER_SITE_OTHER)
Admission: RE | Admit: 2010-11-13 | Discharge: 2010-11-13 | Disposition: A | Discharge status: Home / Self Care | Payer: Medicaid Other | Source: Ambulatory Visit | Attending: Family Medicine | Admitting: Family Medicine

## 2010-11-13 ENCOUNTER — Ambulatory Visit: Payer: Self-pay | Admitting: Family Medicine

## 2010-11-13 VITALS — BP 100/84 | Temp 98.0°F | Wt 98.0 lb

## 2010-11-13 DIAGNOSIS — S93409A Sprain of unspecified ligament of unspecified ankle, initial encounter: Secondary | ICD-10-CM

## 2010-11-13 DIAGNOSIS — H5060 Mechanical strabismus, unspecified: Secondary | ICD-10-CM | POA: Insufficient documentation

## 2010-11-13 DIAGNOSIS — L03119 Cellulitis of unspecified part of limb: Secondary | ICD-10-CM

## 2010-11-13 DIAGNOSIS — L02419 Cutaneous abscess of limb, unspecified: Secondary | ICD-10-CM

## 2010-11-13 MED ORDER — CEPHALEXIN 500 MG PO CAPS: 500.0000 mg | ORAL_CAPSULE | Freq: Three times a day (TID) | ORAL | Status: AC

## 2010-11-13 NOTE — Progress Notes (Signed)
  Subjective:    Patient ID: [name], male    DOB: [birth year], 25 y.o.   MRN: [id number]  HPI  Patient seen with left lateral ankle pain. Questionable history of sprain a few days ago. Also rubbed abrasion lateral ankle noted at L4-5 days ago. Two day history of some redness and swelling lateral ankle with slight warmth. No fever or chills. No drainage. Patient unable to give good history of possible prior injury but thinks he may have sprained ankle several days ago. Mild pain with weightbearing.   Review of Systems     Objective:   Physical Exam  patient is alert and cooperative to exam.  Chest clear to auscultation Heart regular rhythm and rate Left ankle reveals small abrasion about a 5 x 6 mm. 2-3 cm surrounding area of warmth , bowel edema , mild tenderness and erythema. No. A needs. He has some tenderness along the distal fibula as the swelling in this region. Patient has some deformity of the foot and ankle which is chronic       Assessment & Plan:   #1 cellulitis left ankle probably secondary to recent abrasion. #2 possible ankle sprain. Ankle edema maybe secondary to #1    start Keflex 500 mg 3 times a day for 10 days. Keep abrasion clean with soap and water. X-rays to rule out fracture. Followup with Dr. Kirtland Bouchard in one week.

## 2010-11-21 ENCOUNTER — Encounter: Payer: Self-pay | Admitting: Internal Medicine

## 2010-11-21 ENCOUNTER — Ambulatory Visit (INDEPENDENT_AMBULATORY_CARE_PROVIDER_SITE_OTHER): Payer: Medicaid Other | Admitting: Internal Medicine

## 2010-11-21 DIAGNOSIS — J309 Allergic rhinitis, unspecified: Secondary | ICD-10-CM

## 2010-11-21 DIAGNOSIS — R569 Unspecified convulsions: Secondary | ICD-10-CM

## 2010-11-21 DIAGNOSIS — E039 Hypothyroidism, unspecified: Secondary | ICD-10-CM

## 2010-11-21 NOTE — Progress Notes (Signed)
  Subjective:    Patient ID: [name], male    DOB: 15-Oct-1985, 25 y.o.   MRN: [id number]  HPI  25 year old patient who is seen today for followup. He has a history of a chronic seizure disorder and is followed closely by neurology. He has had a recent bout of left lateral ankle cellulitis that has nicely resolved. He has a history of allergic rhinitis which has been stable on his maintenance medications. He feels quite well today. He has a history of mitral valve prolapse.   Review of Systems  Constitutional: Negative for fever, chills, appetite change and fatigue.  HENT: Negative for hearing loss, ear pain, congestion, sore throat, trouble swallowing, neck stiffness, dental problem, voice change and tinnitus.   Eyes: Negative for pain, discharge and visual disturbance.  Respiratory: Negative for cough, chest tightness, wheezing and stridor.   Cardiovascular: Negative for chest pain, palpitations and leg swelling.  Gastrointestinal: Negative for nausea, vomiting, abdominal pain, diarrhea, constipation, blood in stool and abdominal distention.  Genitourinary: Negative for urgency, hematuria, flank pain, discharge, difficulty urinating and genital sores.  Musculoskeletal: Negative for myalgias, back pain, joint swelling, arthralgias and gait problem.  Skin: Negative for rash.  Neurological: Negative for dizziness, syncope, speech difficulty, weakness, numbness and headaches.  Hematological: Negative for adenopathy. Does not bruise/bleed easily.  Psychiatric/Behavioral: Negative for behavioral problems and dysphoric mood. The patient is not nervous/anxious.        Objective:   Physical Exam  Constitutional: He is oriented to person, place, and time. He appears well-developed.  HENT:  Head: Normocephalic.  Right Ear: External ear normal.  Left Ear: External ear normal.  Eyes: Conjunctivae and EOM are normal.  Neck: Normal range of motion.  Cardiovascular: Normal rate.   Murmur (  grade 3/6 systolic murmur loudest at the apex) heard. Pulmonary/Chest: Breath sounds normal.  Abdominal: Bowel sounds are normal.  Musculoskeletal: Normal range of motion. He exhibits no edema and no tenderness.  Neurological: He is alert and oriented to person, place, and time.  Skin:        Resolving erythema and slight crusting involving his left lateral ankle  Psychiatric: He has a normal mood and affect. His behavior is normal.          Assessment & Plan:   seizure disorder relatively stable  Allergic rhinitis  Status post  Cellulitis  Of  the left ankle

## 2010-11-21 NOTE — Patient Instructions (Addendum)
It is important that you exercise regularly, at least 20 minutes 3 to 4 times per week.  If you develop chest pain or shortness of breath seek  medical attention.  Return in 4 months for follow-up Take your antibiotic as prescribed until ALL of it is gone, but stop if you develop a rash, swelling, or any side effects of the medication.  Contact our office as soon as possible if  there are side effects of the medication.  Consider Podiatry referral

## 2010-12-16 LAB — DIFFERENTIAL
Basophils Absolute: 0 10*3/uL (ref 0.0–0.1)
Basophils Absolute: 0 10*3/uL (ref 0.0–0.1)
Basophils Relative: 0 % (ref 0–1)
Basophils Relative: 1 % (ref 0–1)
Lymphocytes Relative: 45 % (ref 12–46)
Monocytes Relative: 5 % (ref 3–12)
Neutro Abs: 2.3 10*3/uL (ref 1.7–7.7)
Neutro Abs: 2.9 10*3/uL (ref 1.7–7.7)
Neutrophils Relative %: 52 % (ref 43–77)
Neutrophils Relative %: 62 % (ref 43–77)

## 2010-12-16 LAB — COMPREHENSIVE METABOLIC PANEL
Albumin: 3.5 g/dL (ref 3.5–5.2)
Alkaline Phosphatase: 83 U/L (ref 39–117)
Alkaline Phosphatase: 93 U/L (ref 39–117)
BUN: 7 mg/dL (ref 6–23)
BUN: 8 mg/dL (ref 6–23)
CO2: 20 mEq/L (ref 19–32)
Chloride: 109 mEq/L (ref 96–112)
Creatinine, Ser: 0.74 mg/dL (ref 0.4–1.5)
GFR calc non Af Amer: >60 mL/min (ref >60)
Glucose, Bld: 103 mg/dL — ABNORMAL HIGH (ref 70–99)
Glucose, Bld: 133 mg/dL — ABNORMAL HIGH (ref 70–99)
Potassium: 3.1 mEq/L — ABNORMAL LOW (ref 3.5–5.1)
Total Bilirubin: 0.5 mg/dL (ref 0.3–1.2)
Total Protein: 6.4 g/dL (ref 6.0–8.3)

## 2010-12-16 LAB — MRSA PCR SCREENING: MRSA by PCR: NEGATIVE

## 2010-12-16 LAB — CBC
HCT: 33 % — ABNORMAL LOW (ref 39.0–52.0)
HCT: 34.9 % — ABNORMAL LOW (ref 39.0–52.0)
Hemoglobin: 11.3 g/dL — ABNORMAL LOW (ref 13.0–17.0)
Hemoglobin: 12.1 g/dL — ABNORMAL LOW (ref 13.0–17.0)
MCHC: 34.2 g/dL (ref 30.0–36.0)
MCV: 83.1 fL (ref 78.0–100.0)
Platelets: 251 10*3/uL (ref 150–400)
RDW: 13.2 % (ref 11.5–15.5)
WBC: 4.5 10*3/uL (ref 4.0–10.5)

## 2010-12-16 LAB — PROTIME-INR: Prothrombin Time: 14.3 seconds (ref 11.6–15.2)

## 2010-12-16 LAB — PHOSPHORUS: Phosphorus: 3.2 mg/dL (ref 2.3–4.6)

## 2010-12-16 LAB — CARBAMAZEPINE LEVEL, TOTAL: Carbamazepine Lvl: 12.1 ug/mL — ABNORMAL HIGH (ref 4.0–12.0)

## 2010-12-16 LAB — MAGNESIUM: Magnesium: 1.8 mg/dL (ref 1.5–2.5)

## 2010-12-16 LAB — TSH: TSH: 0.138 u[IU]/mL — ABNORMAL LOW (ref 0.350–4.500)

## 2010-12-23 LAB — COMPREHENSIVE METABOLIC PANEL
BUN: 8 mg/dL (ref 6–23)
Calcium: 8.8 mg/dL (ref 8.4–10.5)
GFR calc non Af Amer: >60 mL/min (ref >60)
Glucose, Bld: 102 mg/dL — ABNORMAL HIGH (ref 70–99)
Total Protein: 7 g/dL (ref 6.0–8.3)

## 2010-12-23 LAB — GLUCOSE, CAPILLARY
Glucose-Capillary: 100 mg/dL — ABNORMAL HIGH (ref 70–99)
Glucose-Capillary: 106 mg/dL — ABNORMAL HIGH (ref 70–99)
Glucose-Capillary: 109 mg/dL — ABNORMAL HIGH (ref 70–99)
Glucose-Capillary: 114 mg/dL — ABNORMAL HIGH (ref 70–99)
Glucose-Capillary: 117 mg/dL — ABNORMAL HIGH (ref 70–99)
Glucose-Capillary: 121 mg/dL — ABNORMAL HIGH (ref 70–99)
Glucose-Capillary: 140 mg/dL — ABNORMAL HIGH (ref 70–99)
Glucose-Capillary: 143 mg/dL — ABNORMAL HIGH (ref 70–99)
Glucose-Capillary: 79 mg/dL (ref 70–99)
Glucose-Capillary: 87 mg/dL (ref 70–99)
Glucose-Capillary: 93 mg/dL (ref 70–99)
Glucose-Capillary: 98 mg/dL (ref 70–99)
Glucose-Capillary: 99 mg/dL (ref 70–99)

## 2010-12-23 LAB — DIFFERENTIAL
Basophils Relative: 0 % (ref 0–1)
Lymphs Abs: 0.9 10*3/uL (ref 0.7–4.0)
Monocytes Relative: 5 % (ref 3–12)
Neutro Abs: 3.7 10*3/uL (ref 1.7–7.7)
Neutrophils Relative %: 77 % (ref 43–77)

## 2010-12-23 LAB — HEPATIC FUNCTION PANEL
ALT: 24 U/L (ref 0–53)
Bilirubin, Direct: 0.1 mg/dL (ref 0.0–0.3)
Indirect Bilirubin: 0.5 mg/dL (ref 0.3–0.9)
Total Protein: 6.2 g/dL (ref 6.0–8.3)

## 2010-12-23 LAB — CULTURE, BLOOD (ROUTINE X 2)

## 2010-12-23 LAB — BASIC METABOLIC PANEL
BUN: 6 mg/dL (ref 6–23)
Calcium: 8.1 mg/dL — ABNORMAL LOW (ref 8.4–10.5)
Calcium: 8.2 mg/dL — ABNORMAL LOW (ref 8.4–10.5)
Chloride: 111 mEq/L (ref 96–112)
Creatinine, Ser: 0.77 mg/dL (ref 0.4–1.5)
GFR calc Af Amer: >60 mL/min (ref >60)
GFR calc non Af Amer: >60 mL/min (ref >60)
GFR calc non Af Amer: >60 mL/min (ref >60)
Glucose, Bld: 103 mg/dL — ABNORMAL HIGH (ref 70–99)
Glucose, Bld: 92 mg/dL (ref 70–99)
Glucose, Bld: 99 mg/dL (ref 70–99)
Potassium: 3.3 mEq/L — ABNORMAL LOW (ref 3.5–5.1)
Potassium: 3.3 mEq/L — ABNORMAL LOW (ref 3.5–5.1)
Potassium: 3.9 mEq/L (ref 3.5–5.1)
Sodium: 138 mEq/L (ref 135–145)
Sodium: 144 mEq/L (ref 135–145)

## 2010-12-23 LAB — CLOSTRIDIUM DIFFICILE EIA: C difficile Toxins A+B, EIA: NEGATIVE

## 2010-12-23 LAB — CBC
HCT: 34.2 % — ABNORMAL LOW (ref 39.0–52.0)
HCT: 38.3 % — ABNORMAL LOW (ref 39.0–52.0)
Hemoglobin: 13.1 g/dL (ref 13.0–17.0)
MCHC: 34.2 g/dL (ref 30.0–36.0)
MCHC: 34.3 g/dL (ref 30.0–36.0)
MCV: 84.8 fL (ref 78.0–100.0)
MCV: 85 fL (ref 78.0–100.0)
Platelets: 213 10*3/uL (ref 150–400)
RDW: 13.7 % (ref 11.5–15.5)
RDW: 13.8 % (ref 11.5–15.5)
WBC: 4.6 10*3/uL (ref 4.0–10.5)

## 2010-12-23 LAB — URINALYSIS, ROUTINE W REFLEX MICROSCOPIC
Ketones, ur: 15 mg/dL — AB
Nitrite: NEGATIVE
Specific Gravity, Urine: 1.016 (ref 1.005–1.030)
Urobilinogen, UA: 0.2 mg/dL (ref 0.0–1.0)
pH: 7 (ref 5.0–8.0)

## 2010-12-23 LAB — TSH
TSH: 0.012 u[IU]/mL — ABNORMAL LOW (ref 0.350–4.500)
TSH: 0.014 u[IU]/mL — ABNORMAL LOW (ref 0.350–4.500)

## 2010-12-23 LAB — CARBAMAZEPINE LEVEL, TOTAL: Carbamazepine Lvl: 12.8 ug/mL — ABNORMAL HIGH (ref 4.0–12.0)

## 2010-12-23 LAB — FECAL LACTOFERRIN, QUANT: Fecal Lactoferrin: POSITIVE

## 2010-12-29 LAB — DIFFERENTIAL
Basophils Absolute: 0 10*3/uL (ref 0.0–0.1)
Basophils Relative: 0 % (ref 0–1)
Lymphocytes Relative: 12 % (ref 12–46)
Neutro Abs: 4.8 10*3/uL (ref 1.7–7.7)
Neutrophils Relative %: 86 % — ABNORMAL HIGH (ref 43–77)

## 2010-12-29 LAB — BASIC METABOLIC PANEL
BUN: 6 mg/dL (ref 6–23)
CO2: 20 mEq/L (ref 19–32)
CO2: 20 mEq/L (ref 19–32)
Calcium: 8 mg/dL — ABNORMAL LOW (ref 8.4–10.5)
Calcium: 8.3 mg/dL — ABNORMAL LOW (ref 8.4–10.5)
Chloride: 109 mEq/L (ref 96–112)
Chloride: 113 mEq/L — ABNORMAL HIGH (ref 96–112)
Creatinine, Ser: 0.67 mg/dL (ref 0.4–1.5)
GFR calc Af Amer: >60 mL/min (ref >60)
GFR calc Af Amer: >60 mL/min (ref >60)
GFR calc non Af Amer: >60 mL/min (ref >60)
Glucose, Bld: 100 mg/dL — ABNORMAL HIGH (ref 70–99)
Potassium: 3.3 mEq/L — ABNORMAL LOW (ref 3.5–5.1)
Sodium: 137 mEq/L (ref 135–145)
Sodium: 140 mEq/L (ref 135–145)

## 2010-12-29 LAB — CBC
HCT: 33.4 % — ABNORMAL LOW (ref 39.0–52.0)
HCT: 39.1 % (ref 39.0–52.0)
Hemoglobin: 11.2 g/dL — ABNORMAL LOW (ref 13.0–17.0)
Hemoglobin: 11.6 g/dL — ABNORMAL LOW (ref 13.0–17.0)
Hemoglobin: 13.2 g/dL (ref 13.0–17.0)
MCHC: 34.2 g/dL (ref 30.0–36.0)
MCHC: 34.7 g/dL (ref 30.0–36.0)
MCV: 83.9 fL (ref 78.0–100.0)
MCV: 84.1 fL (ref 78.0–100.0)
MCV: 84.4 fL (ref 78.0–100.0)
Platelets: 232 10*3/uL (ref 150–400)
Platelets: 292 10*3/uL (ref 150–400)
RBC: 3.85 MIL/uL — ABNORMAL LOW (ref 4.22–5.81)
RBC: 3.95 MIL/uL — ABNORMAL LOW (ref 4.22–5.81)
RBC: 4.01 MIL/uL — ABNORMAL LOW (ref 4.22–5.81)
RDW: 13.6 % (ref 11.5–15.5)
WBC: 3.1 10*3/uL — ABNORMAL LOW (ref 4.0–10.5)
WBC: 3.4 10*3/uL — ABNORMAL LOW (ref 4.0–10.5)
WBC: 3.6 10*3/uL — ABNORMAL LOW (ref 4.0–10.5)
WBC: 4.2 10*3/uL (ref 4.0–10.5)
WBC: 5.6 10*3/uL (ref 4.0–10.5)

## 2010-12-29 LAB — MAGNESIUM: Magnesium: 2 mg/dL (ref 1.5–2.5)

## 2010-12-29 LAB — CULTURE, BLOOD (ROUTINE X 2): Culture: NO GROWTH

## 2010-12-29 LAB — URINALYSIS, ROUTINE W REFLEX MICROSCOPIC
Bilirubin Urine: NEGATIVE
Glucose, UA: 100 mg/dL — AB
Ketones, ur: NEGATIVE mg/dL
Leukocytes, UA: NEGATIVE
pH: 6 (ref 5.0–8.0)

## 2010-12-29 LAB — CLOSTRIDIUM DIFFICILE EIA: C difficile Toxins A+B, EIA: NEGATIVE

## 2010-12-29 LAB — COMPREHENSIVE METABOLIC PANEL
ALT: 27 U/L (ref 0–53)
AST: 26 U/L (ref 0–37)
Alkaline Phosphatase: 72 U/L (ref 39–117)
Alkaline Phosphatase: 94 U/L (ref 39–117)
BUN: 8 mg/dL (ref 6–23)
CO2: 20 mEq/L (ref 19–32)
Chloride: 110 mEq/L (ref 96–112)
Chloride: 110 mEq/L (ref 96–112)
Creatinine, Ser: 0.66 mg/dL (ref 0.4–1.5)
Creatinine, Ser: 0.75 mg/dL (ref 0.4–1.5)
GFR calc Af Amer: >60 mL/min (ref >60)
GFR calc non Af Amer: >60 mL/min (ref >60)
GFR calc non Af Amer: >60 mL/min (ref >60)
Glucose, Bld: 110 mg/dL — ABNORMAL HIGH (ref 70–99)
Potassium: 3.2 mEq/L — ABNORMAL LOW (ref 3.5–5.1)
Potassium: 3.3 mEq/L — ABNORMAL LOW (ref 3.5–5.1)
Total Bilirubin: 0.7 mg/dL (ref 0.3–1.2)
Total Bilirubin: 0.8 mg/dL (ref 0.3–1.2)

## 2010-12-29 LAB — PHOSPHORUS: Phosphorus: 4.5 mg/dL (ref 2.3–4.6)

## 2010-12-29 LAB — FECAL LACTOFERRIN, QUANT

## 2010-12-29 LAB — POCT CARDIAC MARKERS
CKMB, poc: <1 ng/mL — ABNORMAL LOW (ref 1.0–8.0)
Myoglobin, poc: 68.7 ng/mL (ref 12–200)

## 2010-12-29 LAB — URINE CULTURE

## 2010-12-29 LAB — CARBAMAZEPINE LEVEL, TOTAL: Carbamazepine Lvl: 8.8 ug/mL (ref 4.0–12.0)

## 2011-01-07 LAB — COMPREHENSIVE METABOLIC PANEL
ALT: 25 U/L (ref 0–53)
AST: 27 U/L (ref 0–37)
Albumin: 3.3 g/dL — ABNORMAL LOW (ref 3.5–5.2)
Alkaline Phosphatase: 83 U/L (ref 39–117)
Potassium: 3.9 mEq/L (ref 3.5–5.1)
Sodium: 137 mEq/L (ref 135–145)
Total Protein: 6.8 g/dL (ref 6.0–8.3)

## 2011-01-07 LAB — URINALYSIS, ROUTINE W REFLEX MICROSCOPIC
Bilirubin Urine: NEGATIVE
Leukocytes, UA: NEGATIVE
Nitrite: NEGATIVE
Specific Gravity, Urine: 1.028 (ref 1.005–1.030)
Urobilinogen, UA: 0.2 mg/dL (ref 0.0–1.0)
pH: 6 (ref 5.0–8.0)

## 2011-01-07 LAB — DIFFERENTIAL
Basophils Relative: 0 % (ref 0–1)
Eosinophils Absolute: 0 10*3/uL (ref 0.0–0.7)
Monocytes Absolute: 0.3 10*3/uL (ref 0.1–1.0)
Monocytes Relative: 5 % (ref 3–12)
Neutro Abs: 6.6 10*3/uL (ref 1.7–7.7)

## 2011-01-07 LAB — CLOSTRIDIUM DIFFICILE EIA

## 2011-01-07 LAB — CULTURE, BLOOD (ROUTINE X 2)

## 2011-01-07 LAB — CALCIUM: Calcium: 7.9 mg/dL — ABNORMAL LOW (ref 8.4–10.5)

## 2011-01-07 LAB — CBC
HCT: 41.2 % (ref 39.0–52.0)
Platelets: 234 10*3/uL (ref 150–400)
RDW: 13.1 % (ref 11.5–15.5)
WBC: 7.4 10*3/uL (ref 4.0–10.5)

## 2011-01-07 LAB — STOOL CULTURE

## 2011-01-07 LAB — URINE MICROSCOPIC-ADD ON

## 2011-01-07 LAB — GIARDIA/CRYPTOSPORIDIUM SCREEN(EIA): Giardia Screen - EIA: POSITIVE

## 2011-01-13 ENCOUNTER — Other Ambulatory Visit: Payer: Self-pay

## 2011-01-13 MED ORDER — DESLORATADINE 5 MG PO TABS: 5.0000 mg | ORAL_TABLET | Freq: Every day | ORAL | Status: DC

## 2011-01-13 NOTE — Telephone Encounter (Signed)
Refill clarinex

## 2011-02-04 NOTE — Consult Note (Signed)
NAME:  [name], [name] NO.:  [id number]   MEDICAL RECORD NO.:  [id number]          PATIENT TYPE:  INP   LOCATION:  6148                         FACILITY:  [facility]   PHYSICIAN:  Melvyn Novas, M.D.  DATE OF BIRTH:  24-Jun-1986   DATE OF CONSULTATION:  DATE OF DISCHARGE:                                 CONSULTATION   Admitted through the [facility] ER on April 20, 2008, to Dr.  Fae Pippin [facility].   [name] is a patient well-known to the Neurology Service and  especially to Dr. Ellison Perez.  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  The patient has a [name] implanted  through which he can receive medications in case he is not able to take  p.o.   [name] is a meanwhile 60, soon 25 year old Caucasian male who lives with  his parents and has partial somatosensory seizures, partial complex  seizures, and secondary generalizing seizures.  They were intractable at  the time his [name] was placed.  By now, he has had seizures only  about once every 10 days and has been doing well with the medication  regimen.  He was [place] the beach for the last 40 days when  he developed again a bout of seizures that appeared to be in clusters  after 2 mg of Ativan were given to him and he received 65 mg of  phenobarbital per [name] and still did not it did not seize, his  parents decided to drive back from the beach to the [place] area and  called me by phone.  I contacted Dr. Radford Pax in advance to let him know that the patient was  arriving and also that I would prefer him to be admitted through a  pediatric service.  According to his last discharge note from January  2009, he is currently on Tegretol, Keppra p.o., Synthroid oral,  Clarinex, Klor-Con, and potassium supplementation.  He was admitted as a  status epilepticus today and received IV phenobarbital.  He responded well and actually will partially ups during this ER visit  in the pediatric exam room #1.  His vital signs show systolic blood  pressure of 110 and diastolic of 65, pulse rate of just above 100.  He  remained tachycardic throughout his admission, respirations were 28,  temperature was 99.9, but [name]' mother is sure that he was febrile and  they drove in the car to the ER today.  He may have developed elevated temperature in response to his seizures,  however.  The patient was on 2 liters of nasal oxygen.  His airway was intact.  He had normal clear bilateral equal breathing  sounds.  No peripheral clubbing, cyanosis, or edema and began moving all  4  extremities to commands and opened his eyes when actually spoke.   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  There is mild mental retardation and growth stunting,  hypothyroidism,  hypotestosteronism, mitral valve prolapse, and seizure  disorder.  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  The  patient lives with parents.  He has an older sister who is healthy.   The patient was after receiving phenobarbital 68 mg piggyback responding  very positively.  He received another 2 mg of Ativan, but did not become  excessively sleepy.  He did not have trouble to protect his airway and  was admitted through the pediatric service to Dr. Fae Pippin ward.  He  was admitted through the ER at 21 hours and 26 minutes.   Urine test showed phenobarbital level that was actually subtherapeutic  at 13.5, Tegretol level that was slightly hypertherapeutic at 13.1  mcg/mL.  Comprehensive metabolic panel showed a sodium of 133 and   potassium of 3.3, both low; CO2 of 17, low; glucose of 99; BUN of 8; and  creatinine of 0.79.  A CBC with differential showed a white blood cell  count of 7.1 in normal range.  H&H was 12.6/36.7 and his thrombocyte  count was 271.   The patient will continue with Tegretol 250 mg t.i.d., dextro 5% was  given IV today, Keppra 1500 mg b.i.d. p.o., and a one-time 750 mg dose.  He will receive levothyroxine daily at 75 mcg, Claritin p.r.n. 10 mg,  phenobarbital 65 mg, and potassium chloride.  He also receives Mysoline  250 mg p.o. nightly and 125 mg daily extra according to the written  admission note.  Topiramate will be given at 150 mg t.i.d. and there is  p.r.n. Tylenol, Motrin, and Ativan as mentioned.   ASSESSMENT:  The patient has a cluster of seizure activities, status  epilepticus as he did not regain his consciousness in between spells.  More than 7 separate seizures were counted by the patient's parents who  were very observant and facilitated today's admission.   PLAN:  Plan is for tomorrow to discharge the patient if he is not  showing seizures for 24 hours and if he is considered safe to go home  from a recovered mental status and physical status.  So far, there is no  evidence of sepsis, infection, inflammation, and his electrolytes are  not unusually deranged for this young man.  Again, after observation, we  can hopefully discharge the patient tomorrow morning.   I thank Dr. Nechama Guard, the pediatric ER physician for allowing me to  participate in this pleasant gentleman's care.      Melvyn Novas, M.D.  Electronically Signed     CD/MEDQ  D:  04/20/2008  T:  04/21/2008  Job:  [id number]

## 2011-02-04 NOTE — Discharge Summary (Signed)
NAME:  [name], [name] NO.:  [id number]   MEDICAL RECORD NO.:  [id number]          PATIENT TYPE:  INP   LOCATION:  6148                         FACILITY:  [facility]   PHYSICIAN:  Deanna Artis. Hickling, M.D.DATE OF BIRTH:  07-22-1986   DATE OF ADMISSION:  03/16/2007  DATE OF DISCHARGE:  03/18/2007                               DISCHARGE SUMMARY   FINAL DIAGNOSES:  1. Carbamazepine toxicity.  2. Intractable simple and complex partial seizures with secondary      generalizations, 345.41, 345.51, 345.11.  3. Gait disorder, 781.2, secondary to [name].  4. Nausea, vomiting and headache, secondary to [name].   SUMMARY OF HOSPITALIZATION:  The patient was admitted with nausea,  vomiting, dystaxic gait, he was unable to keep food down.  He required  intravenous fluids to rehydrate him.  We decreased his Tegretol to 250  mg 3 times a day.  It appeared from the family's notes that they  increased his dose to 300 mg morning, afternoon and night time, which is  what I think caused his problem.   Patient had a generalized seizure 2 days before and fell and struck his  head in 2 places and lacerated his ear.  He had a seizure the next  morning when I was planning to discharge him and so discharge was  postponed for 1 more day.  During that time, he had not shown signs of  closed head injury.  When he arrived home, however, he was dystaxic,  vomiting and unsteady on his feet and therefore the decision was made to  admit him to the hospital.  We dropped Tegretol down to 250 mg 3 times a  day.  His drug level was 12.6, which is the highest recorded that I have  seen for [facility].  The next day, his level had dropped down to 9.6.  He  was more steady on his feet.  He had begun to eat.  Today, he is still a  bit unsteady on his feet, but he is eating, feeling well, no headaches,  no vomiting and is, otherwise, at neurologic baseline.   VITAL SIGNS:  Blood pressure  95/72, resting pulse 92, respirations 20,  oxygen saturation 99%.  He is fit to be discharged home.  I have written  a prescription for Tegretol 250 mg 3 times a day.  His other medicines  include Topamax 150 mg 3 times a day, Keppra 1250 mg twice daily,  Synthroid 75 mcg daily, primidone 125 mg in the morning, 250 at night  time, Clarinex 5 mg at bedtime, Klor-Con 220 mEq 1 four times daily, K-  Phos 500 mg 4 times daily.  He is discharged in improved condition.  He  will be followed up in my office at the regularly scheduled time.  I  have written a prescription for a head guard for him to try to prevent  further concussions.  I have also written a prescription for Tegretol as  noted above.      Deanna Artis. Sharene Skeans, M.D.  Electronically Signed     WHH/MEDQ  D:  03/18/2007  T:  03/18/2007  Job:  [id number]

## 2011-02-04 NOTE — Discharge Summary (Signed)
NAME:  [name], [name] NO.:  [id number]   MEDICAL RECORD NO.:  [id number]          PATIENT TYPE:  INP   LOCATION:  6148                         FACILITY:  [facility]   PHYSICIAN:  Celine Ahr, M.D.DATE OF BIRTH:  1986/01/23   DATE OF ADMISSION:  04/20/2008  DATE OF DISCHARGE:  04/22/2008                               DISCHARGE SUMMARY   REASON FOR HOSPITALIZATION:  Increasing frequency of seizures.   SIGNIFICANT FINDINGS:  [name] is a 25 year old male with a complicated  medical history including MR and seizure disorder who presented in  status epilepticus.  In the emergency room, he was loaded with Keppra  and phenobarbital after failure to control his seizure activity at home  with Ativan, phenobarbital, and Valium.  An EEG was obtained during the  admission that showed abnormal bihemispheric sharp complexes which  Neurology assessed to be interictal activity.  Levels of his seizure  medications were obtained, including a Tegretol level at 13.1 and a  phenobarbital level at 13.5 (this was after his loading dose of 12  mg/kg).  Neurology recommended continuing current dosages of medications  during hospitalization.  A blood culture was also obtained on admission  that at the time of discharge was showing Gram-positive cocci in  clusters.  Urine culture was pending at the time of discharge.  Urinalysis was within normal limits except for a glucose of 100.  CBC  was within normal limits as well, with a white blood count of 7.1, 89%  neutrophils.  Chemistries on admission were stable, with BUN of 8 and  creatinine of 0.79.  [name] was started on IV vancomycin on April 21, 2008, at noon as he was having increasingly high fevers during the  hospital course.  He has subsequently remained afebrile for the 20 hours  prior to discharge.  He has had no further seizure activity.   TREATMENT:  Loaded in the emergency room with phenobarbital and Keppra.  Continued on home  seizure medications with no change in dosage.  Started  on vancomycin on April 21, 2008, at noon and continued.   OPERATIONS AND PROCEDURES:  April 20, 2008, chest x-ray; April 21, 2008,  EEG.   FINAL DIAGNOSIS:  Line infection ([name] with Gram-positive cocci  in clusters), exact organism pending at this time, past infections with  Staphylococcus epidermidis, treated with vancomycin.   DISCHARGE MEDICATIONS AND INSTRUCTIONS:  1. Vancomycin 500 mg IV q.8 x14 days.  Vancomycin trough to be drawn      on April 24, 2008, with 12 o'clock dose, with the results to be      faxed to [phone] [facility], Pediatric Floor).  He is to      have a BUN and creatinine drawn every Monday and Thursday while on      vancomycin, these results faxed to his primary pediatrician at [phone].  2. Tegretol 250 mg t.i.d.  3. Topamax 150 mg t.i.d.  4. Keppra 1500 mg b.i.d.  5. Synthroid 75 mcg daily.  6. Primidone 125 mg q.a.m., 250 mg q.p.m.  7. Clarinex 5 mg daily.  8. Klor-Con 20 mEq q.i.d.  9. K-Phos q.i.d.  10.Ativan p.r.n. for seizures greater than 5 minutes.   PENDING RESULTS:  1. Blood culture and urine culture.  We will follow up the exact      organism and susceptibilities in the blood culture.  We will      contact the family if there needs to be any changes in the      antibiotic coverage.  2. Followup.  [name] is to follow up with Neurology, Dr. Sharene Skeans, as      needed for his routine exams as well as Dr. Amador Cunas.   DISCHARGE WEIGHT:  35 kg.   DISCHARGE CONDITION:  Stable.     ______________________________  Harlow Ohms, Resident, Pediatrics      Celine Ahr, M.D.  Electronically Signed    KA/MEDQ  D:  04/22/2008  T:  04/23/2008  Job:  [id number]

## 2011-02-04 NOTE — Consult Note (Signed)
NAME:  [name], [name] NO.:  [id number]   MEDICAL RECORD NO.:  [id number]          PATIENT TYPE:  INP   LOCATION:  3109                         FACILITY:  [facility]   PHYSICIAN:  Casimiro Needle L. Reynolds, M.D.DATE OF BIRTH:  Sep 17, 1986   DATE OF CONSULTATION:  04/16/2009  DATE OF DISCHARGE:                                 CONSULTATION   REQUESTING PHYSICIAN:  Valerie A. Felicity Coyer, MD   REASON FOR EVALUATION:  Seizures.   HISTORY OF PRESENT ILLNESS:  This is an inpatient consultation  evaluation of this existing [facility] patient, a 25-  year-old male very well known to our physicians.  He is a lifelong  patient of Dr. Darl Householder with multiple medical problems, including a  very refractory epilepsy.  He is on multiple drug therapy, and also has  of vagal nerve stimulator in place.  He was last hospitalized in late  February, at which time he had a bout of seizures associated with viral  gastroenteritis.  He recently went to the [place] with his family, and  states reportedly he did very well.  They came back 2 days ago, and  yesterday he had a single hard seizure, according to his father.  However, although still a bit drowsy after this, he otherwise seemed to  be feeling well and he was not treated.  Also this morning after waking,  he began to develop serial complex partial seizures of his usual type.  His father gave him an injection of a compound which is being utilized  through a research study, but the seizures continued.  An hour later,  his father accessed his [name] and gave him 1 mg of Ativan and 65  mg of phenobarbital intravenously, which is his habit.  However, the  seizures continued in spite of this.  At that time, family knows he was  very warm to the touch.  He was brought to the emergency department,  where he continued to have complex partial seizures.  He also had a high  fever and has been coughing vigorously.  Chest x-ray in  the ED has been  unremarkable.  He has been febrile, as high as 101.6 at about 4:00 p.m.  today.  He received a little bit more intravenous Ativan, and seizures  have settled down, but he is still having occasional partial seizures  which his father is witnessing.  He is drowsy but able to respond a  little bit.  His father had gave he has not had any recent coughing or  other symptoms suggestive of infection, and has not had fever until they  noticed he is febrile after seizures.   Past medical history is remarkable for:  1. Epilepsy as above.  2. He has a known history of Brown syndrome, panhypopituitarism,      microcephaly, growth hormone and thyroid dysfunction, and mild      mental retardation.  He has been in the hospital numerous times for      a breakthrough seizure activity.   FAMILY/SOCIAL/REVIEW OF SYSTEMS:  Per admission H and P by Dr. Felicity Coyer.  Noteworthy finding is the fact that he claims to live at home with his  parents, and that he has not had any recent symptoms suggestive of a  respiratory or gastrointestinal illness.   PHYSICAL EXAMINATION:  VITAL SIGNS:  Temperature 101.6, blood pressure  107/64, pulse 125, respirations 17, O2 sats 99% on room air.  GENERAL:  This is chronically ill-appearing young man, appearing younger  than stated age, supine in hospital bed, in no distress.  HEENT:  Head, cranial is microcephalic and atraumatic.  Oropharynx is  benign.  NECK:  Supple without carotid bruit.  HEART:  Regular rate without murmurs.  CHEST:  Clear to auscultation bilaterally.  NEUROLOGIC:  Mental status, he is groggy but is able to alert briefly.  Tries to answer questions, but his speech is garbled and difficult to  understand.  He is able to follow simple commands.  Cranial nerves,  pupils are equal and reactive.  He did look left to right.  Face,  tongue, and palate move normally and symmetrically.  Motor, normal bulk  and tone and moves all extremities  voluntarily.  Sensation, intact to  light touch in all extremities.  Toes are downgoing bilaterally.   LABORATORY REVIEW:  CBC, white count 5.6, hemoglobin 13.2, platelets  292,000.  CMET unremarkable.  Urinalysis negative.  Tegretol level 8.8.   IMPRESSION:  Refractory epilepsy with breakthrough seizures in the  setting of fever, question secondary to infection, rule out aspiration.   RECOMMENDATIONS:  He is being admitted to the ICU, would agree with that  level of observation for now.  Treat fever aggressively, as I suspected  this is primarily driving his seizures at this point.  We will alternate  Tylenol with Motrin every 2 hours for fever control.  If he has another  cluster seizures, we will treat with Ativan 1 mg and phenobarbital 65 mg  to suppress these.  I will continue standard seizure regimen, which may  need to be given via NG of Panda tube if he is too sedated to take oral  medications, as the Tegretol, primidone, Topamax all must get in p.o.  and do not have an IV equivalent.  Withdrawal if development of  aspiration pneumonia.  We will follow up with you.  Thank you for this  consultation.  He typically does well after his acute episodes of  seizure are controlled.      Michael L. Thad Ranger, M.D.  Electronically Signed     MLR/MEDQ  D:  04/16/2009  T:  04/17/2009  Job:  [id number]

## 2011-02-04 NOTE — H&P (Signed)
NAME:  CARON, TARDIF NO.:  [id number]   MEDICAL RECORD NO.:  [id number]          PATIENT TYPE:  INP   LOCATION:  3109                         FACILITY:  [facility]   PHYSICIAN:  Raenette Rover. Felicity Coyer, MDDATE OF BIRTH:  [birth year]   DATE OF ADMISSION:  04/16/2009  DATE OF DISCHARGE:                              HISTORY & PHYSICAL   PRIMARY CARE PHYSICIAN:  Gordy Savers, MD   NEUROLOGIST:  Deanna Artis. Hickling, MD   CHIEF COMPLAINT:  Intractable seizures.   HISTORY OF PRESENT ILLNESS:  Mr. Malacara is a 25 year old gentleman with  history of severe epilepsy, growth disorder, mild MR, hypothyroidism,  Brown syndrome, plus MVP, who has recently begun on a new trial  medication of injectable diazepam.  He apparently has been doing well  for the last week while on vacation with his [place] the beach.  However, he has had cluster of seizures what prompts his ER evaluation  today.  Mother describes a hard seizure starting 2:30 yesterday, April 15, 2009, which lasted 6 minutes and then another hard seizure at 8:30  this morning.  After these seizures when he was given the injectable  diazepam per study protocol, but proceeded with another bad seizure 10  minutes later.  Seizures have continued every 2-8 minutes throughout the  day prompting family to give phenobarb IV through [name] per  instructions as directed by managing neurologist, Dr. Sharene Skeans, but the  seizures have not diminished.  He has had over 50 in the last 24 hours  and continues with seizures here.  He was given 2 mg of IV at Ativan  through his [name], which seems to have decreased the intensity of  seizures, but they continue now every 2-5 minutes.  His seizures include  tensing and contracting in the left lower extremity followed by  contracting of the right upper extremity with a brief vocalization of  grunt.  There is apnea during the seizure, but no significant post ictal  period noted.   Family reports this has been typical of his seizures.   PAST MEDICAL HISTORY:  1. Seizure disorder.  2. Growth disorder.  3. Mild MR.  4. Hypothyroidism.  5. Brown syndrome.  6. Chronic hypokalemia.  7. MVP.   Allergies include DILANTIN, SOME ANTIHISTAMINES, and DEPAKOTE.   PAST SURGICAL HISTORY:  [name], vagus nerve stimulator with battery  changed on March 2007.   FAMILY HISTORY:  Mother is living, has migraines.  Father living with  known hypertension as well as migraine history.  Sister also with  migraines.   SOCIAL HISTORY:  He lives with his mother and father as well as his  sister Alona Bene, who is his primary caregiver.  He has completed a senior  year at [employer].  He does not smoke or drink.   REVIEW OF SYSTEMS:  As per parents given the patient is unable to give.  GENERAL:  No fever or chills.  CARDIOVASCULAR:  No complaints of chest  pain.  RESPIRATORY:  Family is concerned with aspiration during hard  seizures.  GI:  Increased appetite  end of last week prior to onset of  seizures now.  No diarrhea or vomiting.  No complaints of abdominal  pain.  GU:  No unusual strong urine smell.  PSYCH:  No depression or  anxiety.  SKIN:  No rashes.  NEURO:  Please see HPI.  Clumsy gait  chronically.  MUSCULOSKELETAL:  Has chronically enlarged joints.  HEME:  No bruising or bleeding.   LABORATORY DATA:  White count normal at 5.6, hemoglobin 13.2, normal  platelets of 292, potassium 3.28m, other electrolytes within normal  limits.  AST, ALT both 31.  Carbamazepine level 8.8.  Cardiac enzymes,  point of care negative x1.  Chest x-ray shows no active disease.   PHYSICAL EXAMINATION:  VITAL SIGNS:  Temperature of 102.5, now 101.6,  blood pressure 99/51, previously 125/70, pulse 120, respiratory 17, O2  sats 100% on room air.  GENERAL:  He is a frail, thin, small male who is seizing intermittently  over 2-3 minutes during my exam.  He is drowsy, but will have one-word   answers between seizure activity.  HEENT:  He is normocephalic, atraumatic.  ENT:  Moist, pink mucosa with  fair dentition.  CARDIOVASCULAR:  Tachycardic, but appears normal.  No  lower extremity edema appreciated.  RESPIRATORY:  Breath sounds clear to  auscultation bilaterally without increased work of breathing but apnea  during seizure spell as described.  ABDOMEN:  Soft, nontender, nondistended with good bowel sounds  throughout.  No masses appreciable to palpation.  PSYCH:  He is calm and sedates, but appropriate for answering and  orientation questions.  As stated, he answer simple yes/no question in  between seizures with a nod of his head.  NEURO:  Positive seizure  activity as noted every few minutes.  Right upper extremity stiffness  following left foot and ankle flexion and brief grunting vocalization at  the end of seizure, otherwise moves extremities spontaneously and does  not appear to have any cranial nerve deficits between seizure activity.  MUSCULOSKELETAL:  No acute joint effusions or deformities.   ASSESSMENT AND PLAN:  1. Intractable seizures.  Question status epilepticus, has now been on      study drug with injectable Valium at the direction of Neurology.      Family reports they are unsure if this is true seizure treatment      versus placebo.  Neurology consult has an requested by EDP and      repeated by [place] pending at this time.  We will continue home      medications plus IV Ativan per protocol to stop seizure.  Request      ICU bed for close supervision and also notify Critical Care.  Dr.      Maurice March has been advised and requested team to keep an eye on the      patient overnight and intervene as needed, especially if      progressive respiratory hemodynamic compromise with treatment of      ongoing seizures.  Discussed with the patient and he has never      prior required intubation for control of his seizures, but we are      aware this may be possible  depending on the outcome with medication      treatment.  2. Fever.  Rule out aspiration pneumonia as concerned by family.      Still with mild hypotension following a fluid bolus in the ER.  We      will start  IV Zosyn as well as IV vancomycin per pharmacy protocol.      Blood cultures x2 have been done in the emergency room, also send      for urine culture.  Plan for formal Critical Care consult if      progressive respiratory or hemodynamic compromise as discussed      above.  No evidence of frank sepsis ongoing at this time and may be      related to ongoing seizure.  3. Hypothyroidism.  We will check TSH and continue home Synthroid IV      dosing as necessary as he is unable to take p.o.  4. History of mitral valve prolapse.  5. Brown syndrome with chronic hypokalemia.  We will continue      potassium p.o. or IV as needed and will recheck in the a.m.      Valerie A. Felicity Coyer, MD  Electronically Signed     VAL/MEDQ  D:  04/16/2009  T:  04/17/2009  Job:  [id number]

## 2011-02-04 NOTE — Consult Note (Signed)
NAME:  SELBY, FOISY               ACCOUNT NO.:  [id number]   MEDICAL RECORD NO.:  [id number]          PATIENT TYPE:  INP   LOCATION:  3040                         FACILITY:  [facility]   PHYSICIAN:  Eugenio Hoes. Tawanna Cooler, MD    DATE OF BIRTH:  02-24-1986   DATE OF CONSULTATION:  09/20/2007  DATE OF DISCHARGE:                                 CONSULTATION   Remon is a 25 year old single male patient of Dr. Amador Cunas who has a  history of underlying seizure disorder, growth retardation, mild mental  retardation, mitral valve prolapse, and Brown syndrome who comes in  today for evaluation of a febrile illness with nausea, vomiting, and  diarrhea.  He has had this illness for 4 days.  Yesterday, he had a  grand mal seizure and went to the emergency room.  He was seen and  evaluated.  A chest x-ray and strep test were negative.  He was told to  stay on liquids.  He was given a shot of Phenergan and sent home.  The  family brought him in today because he is no better.   He has only urinated once in the last 24 hours.   PAST MEDICAL HISTORY:  In that he has been followed by Dr. Sharene Skeans for  evaluation of chronic seizure disorder.  He has partial seizures with  secondary generalization.  He is on multiple anticonvulsants including a  vagal nerve stimulator.  He also has a [name] access for a  parenteral antiepileptic drugs.   PAST ILLNESSES:  Other than above negative.   INJURIES:  Negative.   DRUG ALLERGIES:  1. DEPAKATE.  2. ANTIHISTAMINES.  3. DILANTIN.   CURRENT MEDICATIONS:  1. Tegretol 250 mg t.i.d.  2. Topamax 150 mg t.i.d.  3. Keppra 1250 mg b.i.d.  4. Synthroid 75 mcg daily.  5. Primidone 250 mg one half tablet in the a.m. and one in the p.m.  6. Clarinex 5 mg daily.  7. Potassium 20 mEq 4 times a day.  8. K-Phos 500 mg 4 times a day.  9. Amoxicillin b.i.d. with meals.   FAMILY HISTORY:  Significant, there is no family history of seizure  disorder.   SOCIAL HISTORY:   He is single.  He is in the 11th grade in high school.  He is 25 years old.   PHYSICAL EXAMINATION:  VITAL SIGNS:  His weight was 79 pounds.  In  September he was 85.  Temp was 98, pulse was 70 and regular,  respirations 12 and regular.  GENERAL:  He is a well developed, very thin male.  He was alert and  oriented, conversant but appears tired.  HEAD:  Significant, he has an odd looking head.  It is short from ear to  ear but very long from front to back.  EYES/EARS/NOSE/THROAT:  Negative.  NECK:  Supple.  Thyroid is not enlarged.  CHEST:  Clear to auscultation.  CARDIAC;  Shows S1 S2 are within normal limits.  S2 is physiologically  split.  He has a grade 2-3 over 6 systolic ejection murmur heard best  over the left  lower sternal border consistent with MR.  He also has a  [name] in the right mid chest wall area.  A subcutaneous vagal  nerve stimulator is present over the left anterior chest wall.  ABDOMEN:  Soft.  Bowel sounds are normal.  There is no tenderness.  SKIN:  Normal.  No rashes.  Good turgor.   IMPRESSION:  1. Viral syndrome with vomiting, diarrhea, and probable dehydration.      Plan:  Admit for intravenous fluids.  2. Seizure disorder.  We will get neurologist involved because of his      complex medications.  3. History of mitral valve prolapse.  4. History of hypothyroidism.   PLAN:  Admit as noted above.  He typically goes to the pediatric  division even though he is 21.  However, they are full and they could  not take him.  Therefore, we will put him on our medical service.      Jeffrey A. Tawanna Cooler, MD  Electronically Signed     JAT/MEDQ  D:  09/20/2007  T:  09/20/2007  Job:  [id number]

## 2011-02-04 NOTE — Consult Note (Signed)
NAME:  [name], [name]               ACCOUNT NO.:  [id number]   MEDICAL RECORD NO.:  [id number]          PATIENT TYPE:  INP   LOCATION:  3040                         FACILITY:  [facility]   PHYSICIAN:  Pramod P. Pearlean Brownie, MD    DATE OF BIRTH:  07/08/1986   DATE OF CONSULTATION:  09/20/2007  DATE OF DISCHARGE:                                 CONSULTATION   REASON FOR REFERRAL:  Seizure management.   HISTORY OF PRESENT ILLNESS:  [name] is a 25 year old Caucasian man  who is well-known to neurology service here because of multiple  admissions for refractory seizures in the past.  This time he was  admitted for diarrhea, vomiting and nausea for the last 3 days.  He has  had two seizures yesterday but has done otherwise reasonably well.  He  was last seen on the neurology service in June 2008, when he was  admitted with carbamazepine toxicity.  He had some medication changes  since then and has done reasonably well.  His last seizure cluster was  about 2 weeks ago, which was treated with IV Ativan and phenobarbital  through his [name].  He has had frequent admissions in the past.  He has been refractory to various medications.  He has shown some  intolerance to Dilantin and Depakote in the past.  His present seizures  regimen includes Tegretol 250 mg three times a day, Topamax 150 mg three  times a day, Keppra 1250 mg twice a day and primidone 125 mg in the  morning and 250 mg at night.  His seizure frequency is at baseline.  Every month he has a cluster of seizures.  Seizure types include simple  partial as well as complex partial with and without secondary  generalization.  He has had several bouts of [name] infection on  three different occasions with Staphylococcus epidermidis in the  catheter.  He has had several courses of antibiotics for that.   PAST MEDICAL HISTORY:  1. Significant for refractory seizures.  2. Mild mental retardation.  3. Microcephaly.  4. Mitral valve  prolapse.  5. Dental malocclusion.  6. Brown syndrome.  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   ALLERGIES TO MEDICATIONS:  DILANTIN enhances seizures.  DEPAKOTE causes  generalized organ dysfunction.  SIMPLE ANTIHISTAMINES may intensify  seizures.   PAST SURGICAL HISTORY:  1. [name] x2.  2. Eye muscle surgery x2.   FAMILY HISTORY:  Strongly positive for migraines but not for seizures or  mental retardation.   SOCIAL HISTORY:  The patient lives at home with his parents.  He is able  to walk independently.  He uses a wheelchair only when he is having  frequent seizures.   MEDICATION LIST:  1. Tegretol 250 mg three times a day.  2. Topamax 150 mg three times a day.  3. Keppra 1250 mg twice a day.  4. Synthroid 75 mcg daily.  5. Primidone 125 mg in the morning, 250 mg at night.  6. Clarinex 5 mg at night.  7. Klor-Con 20 mEq four times a day.  8. K-Phos 500  mg four times a day.   REVIEW OF SYSTEMS:  Positive for nausea, vomiting, diarrhea, gait  difficulties, seizures.   PHYSICAL EXAM:  A frail, cachectic young male who has obvious growth  retardation and looks a lot younger than his stated age.  He is febrile.  Pulse rate is 78 per minute, respiratory rate 16 per  minute, distal pulses well felt.  He has microcephaly.  His neck is supple.  Face is symmetric.  Tongue is  midline.  Motor system exam reveals no upper extremity drift.  He does  have mild dysarthria but he is oriented to time, place and person.  His  language seems normal.  There is no facial weakness.  Eye movements are  full-range.  There is no nystagmus.  Tongue is midline.  Motor system  exam reveals no drift.  He has symmetric strength in all four  extremities.  He has mild finger-to-nose and knee-to-heel dysmetria.  His gait is broad-based, ataxic, and he is unsteady when he makes a  turn.  He has mild bilateral weakness on ankle dorsiflexors and evertors  with mild foot  drops bilaterally.   DATA REVIEWED:  Multiple previous hospital consultation notes, as stated  above, were reviewed.   IMPRESSION:  A 24 year old male with known history of mild mental  retardation and refractory seizure disorder, who was admitted mainly  with dehydration and gastrointestinal viral illness.  His seizure  frequency is not particularly increased.  Because of his severe  diarrhea, there is concern about whether his oral medications may be  absorbed or not, and hence I would recommend changing the Keppra to IV.  Unfortunately, the rest of his seizure medications cannot be changed to  IV form.  I recommend he continue his present dosages of Tegretol,  Topamax and primidone as stated above and switch to Keppra IV 1250 mg  twice a day.  Check carbamazepine and phenobarbital levels.  Continue IV  hydration and treatment of diarrhea as per you.  No further neurological  testing is indicated at the present time.  Kindly call for questions.           ______________________________  Sunny Schlein. Pearlean Brownie, MD     PPS/MEDQ  D:  09/20/2007  T:  09/21/2007  Job:  [id number]

## 2011-02-04 NOTE — Consult Note (Signed)
NAME:  [name], KATS NO.:  [id number]   MEDICAL RECORD NO.:  [id number]          PATIENT TYPE:  INP   LOCATION:  6525                         FACILITY:  [facility]   PHYSICIAN:  [name], MD    DATE OF BIRTH:  [birth year]   DATE OF CONSULTATION:  11/19/2008  DATE OF DISCHARGE:                                 CONSULTATION   REFERRING PHYSICIAN:  Valerie A. Felicity Coyer, MD.   REASON FOR CONSULTATION:  Recurrent seizures associated with nausea,  vomiting, and fever.   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  He had 2 grand mal seizures on November 17, 2008, and 2 seizures yesterday.  He was given Ativan per Dr.  Darl Householder instructions to parents.  Seizures lasted only 1-2 minutes  each.  He has had recurrent admissions for nausea and vomiting with  associated seizures.  His last CT scan was done in October 2009, which  showed no changes from previous studies with dense bilateral basal  ganglionic calcifications.  His current medications for seizure control  include Keppra 1500 mg b.i.d., primidone 125 mg q.a.m. and 250 mg at  bedtime, Tegretol 250 mg b.i.d., and Topamax 250 mg t.i.d.  He has  parenteral Ativan and phenobarbital to be administered on a p.r.n. basis  for recurrent seizures via [name].  His Tegretol level yesterday  was 10.3.  He has had no recurrent seizures during emergency room stay  nor since admission.  CBC and electrolytes were unremarkable.  Urinalysis was also unremarkable.  Blood cultures so far had been  negative.   PAST MEDICAL HISTORY:  Remarkable for seizure disorder, growth disorder,  mild mental retardation, hypothyroidism, mitral valve prolapse, cardiac  dysrhythmia, and Brown syndrome.   FAMILY HISTORY:  Negative for seizure disorder.   The patient's parents  as well as sisters have migraine headaches.  Family history is positive  for diabetes mellitus affecting all of his grandparents.  His father has  high blood pressure.   PHYSICAL EXAMINATION:  GENERAL:  Appearance was that of a slender young  male who appeared to be considerably younger than his actual age.  He  was alert and cooperative, and in no acute distress.  He denied feelings  of nausea.  HEENT:  His pupils were equal and reactive normally to light.  Extraocular movements were full and conjugate.  Visual fields were  intact and normal.  There was no facial weakness.  Hearing was normal.  Speech and palate movement were normal.  EXTREMITIES:  Strength and muscle tone were normal throughout.  Deep  tendon reflexes were absent throughout.  Plantar responses were flexor  bilaterally.   CLINICAL IMPRESSION:  Recurrent generalized seizures associated with  nausea, vomiting, and diarrhea with possible reduced absorption of  seizure medications contributing to his recurrent seizures.  His exam is  unchanged from that of previous admissions.  His nausea and vomiting  have markedly diminished at this  point.  He has not had recurrent  seizures since arriving here at the hospital.   RECOMMENDATIONS:  1. No change in routine medications for management of seizures.  2. Ativan 2 mg IV, if the patient has a recurrent seizure.  3. Call Neurology for followup as well if the patient has recurrent      seizure.  4. No neurodiagnostic studies are indicated.   Thank you for asking me to participate in this patient's care.      [name], MD  Electronically Signed     [name], MD  Electronically Signed    CS/MEDQ  D:  11/19/2008  T:  11/19/2008  Job:  [id number]

## 2011-02-04 NOTE — H&P (Signed)
NAME:  [name], GRZELAK NO.:  [id number]   MEDICAL RECORD NO.:  [id number]          PATIENT TYPE:  OBV   LOCATION:  6119                         FACILITY:  [facility]   PHYSICIAN:  Deanna Artis. Hickling, M.D.DATE OF BIRTH:  [birth year]   DATE OF ADMISSION:  03/14/2007  DATE OF DISCHARGE:                              HISTORY & PHYSICAL   CHIEF COMPLAINT:  Single un-witnessed generalized seizure with fall and  mild closed head injury.   HISTORY OF PRESENT CONDITION:  [name] is a 25 year old with seizures  since age 25.  These are simple partial somatosensory, complex partial  and secondary generalized seizures.  They were intractable.  Today, the  patient was in the kitchen by himself.  His family heard him fall.  As  he did, he apparently he had a standing cupboard with the front of his  head and hit the floor side of his head.  He lacerated his left ear.  He  was in postictal state.  The seizure itself lasted less than a minute.  He said that he could not see well enough to count fingers.  This  cleared.  He was brought to the emergency room, because he told his  family that he thought he was going to have more seizures.  Typically,  when he tells parents that, it occurs in a very high percentage of the  time.   The patient is on four medications, including Keppra, Topamax, Tegretol  and Primidone, as well as vagal nerve stimulator.  This has dramatically  decreased his seizure frequency and severity.  In addition, they have an  IV access problem.  He has a [name] for the last several years, so  find family can access in case of recurrent seizures.  He is given  Ativan 2 mg and phenobarbital 65 mg IV, and that usually keeps him out  of the hospital.   PAST MEDICAL HISTORY:  Panhypopituitarism with growth hormone and  thyroid dysfunction, mild mental retardation, microcephaly, VST with  mitral valve prolapse, dental malocclusion and Brown's Syndrome (a eye  muscle  disorder).   CURRENT MEDICATIONS:  1. Tegretol 250 mg 7:00 a.m., 1:30 p.m., 300 mg 9:00 p.m.  2. Topamax 150 mg 7:00 a.m., 1:30 p.m., 9:00 p.m.  3. Keppra 1250 mg 7:00 a.m., 9:00 p.m.  4. Synthroid 75 mcg 7:00 a.m.  5. Primidone 125 mg 7:00 a.m., 9:00 p.m.  6. Clarinex 5 mg 9:00 p.m.  7. Klor-Con 20 mEq 7:00 a.m., 1:30 p.m., 5:00 p.m., 9:00 p.m.  8. K-PHOS 500 mg 7:00 a.m., 1:30 p.m., 5:00 p.m., 9:00 p.m.   DRUG ALLERGIES:  INTOLERANCE TO DILANTIN WHICH ENHANCES HIS SEIZURES.  DEPAKOTE WHICH CAUSES GENERALIZED ORGAN DYSFUNCTION.  SIMPLE  ANTIHISTAMINES ALSO INTENSIFY HIS SEIZURES.   PAST SURGICAL HISTORY:  [name] x2.  He has had infections on three  occasions with staph epidermidis of this catheter, which has been  sterilized with IV antibiotics; eye muscle surgery x2, implantation and  reimplantation of his vagal nerve stimulator.   FAMILY HISTORY:  Mother, father, sister all have migraines.  Father has  hypertension.  All 4 grandparents have diabetes.  Mother has breast  cancer and is in remission.  No family history of seizures, mental  retardation, blindness, deafness, birth defects or autism.   SOCIAL HISTORY:  He is in cross categorical classes at [employer] school.  He lives with his parents.  His sister is the major caretaker.  Mother  is on medical leave, because of her breast cancer (she was a  Chartered loss adjuster).  Father is an Nutritional therapist.  There are no barriers  to his care.   EXAMINATION:  VITAL SIGNS:  Today:  Temperature 97, blood pressure  102/63, resting pulse 87, respirations 16, oxygen saturation 100%.  HEENT:  Ear, nose and throat:  Wax in his external auditory canals, no  signs of infection.  NECK:  Supple neck.  Microcephaly.  He has bruises on the center  forehead and left forehead as noted above, and minor lacerations to the  left ear.  LUNGS:  Clear.  HEART:  Shows a VSD murmur.  Pulses are normal.  ABDOMEN:  Soft.  Bowel sounds normal.  No  hepatosplenomegaly.  EXTREMITIES:  Thin, decreased muscle mass.  He has tight heel cords.  NEUROLOGIC:  He has mild mental retardation.  Speech:  Mild dysarthria.  Language is acceptable.  Cranial Nerves:  Round reactive pupils.  Full  extraocular movements.  Fundi normal.  Visual fields full.  Symmetric  facial strength.  Midline tongue.  Air conduction greater than bone  conduction bilaterally.  Motor examination:  Normal strength, tone and  decreased mass.  Fine motor movements were clumsy.  Sensory  examinations:  Intact to pinprick and stereognosis.  Cerebellar  examination:  Good finger-to-nose, heel-knee-shin, gait not tested.  Deep tendon reflexes were absent.  He had bilateral flexor plantar  responses.   IMPRESSION:  Intractable mixed disorder 345.51, 345.41, 345.11, un-  witnessed brief generalized tonic-clonic seizure.  Closed head injury  without loss of consciousness, with transient loss and blurring of  vision.   PLAN:  Will observe in the hospital, until tomorrow morning.  If he has  no further seizures, he will be discharged.  If he has further seizures,  he will be given Ativan 2 mg, phenobarbital 65 mg IV if he has recurrent  somatosensory seizures or single generalized tonic-clonic seizure.  There are no labs that are necessary for now.  He has had them recently,  and there is no reason for [place] to check levels, because his medicines  have been optimized, and family is compliant with medication.      Deanna Artis. Sharene Skeans, M.D.  Electronically Signed     [facility]/MEDQ  D:  03/14/2007  T:  03/14/2007  Job:  [id number]

## 2011-02-04 NOTE — Discharge Summary (Signed)
NAME:  [name], [name] NO.:  [id number]   MEDICAL RECORD NO.:  [id number]          PATIENT TYPE:  INP   LOCATION:  6119                         FACILITY:  [facility]   PHYSICIAN:  Deanna Artis. Hickling, M.D.DATE OF BIRTH:  1985/12/22   DATE OF ADMISSION:  03/14/2007  DATE OF DISCHARGE:  03/16/2007                               DISCHARGE SUMMARY   FINAL DIAGNOSES:  1. Intractable simple and complex partial seizures with secondary      generalization (345.51, 345.41, 345.11)  2. Closed head injury without loss of consciousness (850.0).   PROCEDURES:  None.   COMPLICATIONS:  None.   SUMMARY OF HOSPITALIZATION:  The patient had an unwitnessed single  generalized seizure with fall in his kitchen.  He struck his head in the  midline frontal region into the left frontal side (cabinet and floor,  respectively).  He also had a mild laceration of his ear.  We elected  not to treat him with rescue medicine which involves Ativan 2 mg and  phenobarbital 65 mg IV.   The patient was observed overnight and observation status was changed to  admission when he had a seizure around 6:50 on the morning of June 23.  The patient stared into space, stopped breathing for a short period  time.  He was bagged, and his color change had gone from pink to gray.  He was not treated with medication, it resolved on its own.   I had assessed him at that point, and he was postictal but aware.   Laboratory studies include CBC:  White blood cell count 2700 which is  typical (he has neutropenia), hemoglobin 12.3, hematocrit 36.3, MCV  82.2, platelet count 260,000, 35% neutrophils which gives an absolute  neutrophil count 945.   Comprehensive metabolic:  Sodium 136, potassium 3.5, chloride 110, CO2  19 (low), BUN 9, creatinine 0.75, glucose 115, AST 20, ALT 22, alkaline  phosphatase 92, total bilirubin 0.8, albumin 3.6, total protein 6.5,  calcium 8.7.   The patient did well during the rest of  the day.  He had no further  seizures.  He has been up and around eating well.   PHYSICAL EXAMINATION:  VITAL SIGNS:  Temperature 97, pulse 74, blood  pressure 102/68, respirations 19, oxygen saturation 98% on room air.  There is no change in his examination.  He has decreased muscle mass and  nonfocal examination, microcephaly, mild mental retardation, mild  dysarthric speech.   PLAN:  I have reviewed his labs today.  We will check any epileptic drug  levels and send him out before those are ready.  I will review them and  adjust his medications accordingly.  He will return to see me at his  regularly scheduled appointment.   MEDICATIONS:  1. Tegretol 250 mg at 7:00 a.m., 1:30 p.m. and 300 mg at 9:00 p.m.  2. Topamax 150 mg at 7:00 a.m., 1:30 p.m., 9:00 p.m.  3. Keppra 1250 mg at 7:00 a.m. and 9:00 p.m.  4. Primidone 125 mg 7:00 a.m. and 250 mg at 9:00 p.m.  5. Synthroid 75 mcg at  7:00 a.m.  6. Clarinex 5 mg at bedtime.  7. Klor-Con 20 mEq at 7:00 a.m., 1:30 p.m., 5:00 p.m., 9:00 p.m.  8. K-Phos 500 mg at 7:00 a.m., 1:30 p.m., 5:00 p.m. at 9:00 p.m..   CONDITION ON DISCHARGE:  He is discharged in improved condition      Deanna Artis. Sharene Skeans, M.D.  Electronically Signed     [facility]/MEDQ  D:  03/16/2007  T:  03/16/2007  Job:  [id number]

## 2011-02-04 NOTE — Procedures (Signed)
EEG NUMBER:  [id number]   HISTORY:  This is a 25 year old patient with a history of seizures since  age 57.  The patient is being admitted with the recurrence of seizure  events.  This is a portable EEG recording.  No skull defects noted.   MEDICATIONS:  Tegretol, Topamax, Keppra, Synthroid, primidone, Clarinex,  Klor-Con, phenobarbital, and Ativan.   EEG classification - dysrhythmia grade [name], generalized.   DESCRIPTION OF RECORDING:  Background rhythm of this recording consists  of a fairly well-modulated, medium amplitude, alpha rhythm of 8 Hz that  is reactive to the eye opening and closure.  As the record progresses,  the patient appears to remain in a waking state throughout the  recording.  An overlying beta activity is seen at times during the  recording.  Intermittently during the recording, there appears to be  evidence of triphasic-type waves appearing in a bitemporal and frontal  distribution that occur symmetrically off and on.  At times, overt sharp  and slow wave activity is seen in the similar distribution.  No overt  spike or spike wave discharges are seen.  Photic stimulation is  performed resulting in a bilateral and symmetric photic driving  response.  Hyperventilation is not performed.  EKG monitor shows no  evidence of cardiac rhythm anomalies with the heart rate of 68.   IMPRESSION:  This is an abnormal electroencephalogram recording due to  bihemispheric sharp and slow wave complexes seen suggesting a  generalized seizure disorder with lowered seizure threshold and appeared  no overt electrographic seizures were recorded however.      Marlan Palau, M.D.  Electronically Signed     ONG:EXBM  D:  04/21/2008 12:34:06  T:  04/22/2008 02:34:15  Job #:  [id number]

## 2011-02-04 NOTE — Discharge Summary (Signed)
NAME:  [name], [name]               ACCOUNT NO.:  [id number]   MEDICAL RECORD NO.:  [id number]          PATIENT TYPE:  INP   LOCATION:  6148                         FACILITY:  [facility]   PHYSICIAN:  Valerie A. Felicity Coyer, MDDATE OF BIRTH:  1986/05/02   DATE OF ADMISSION:  09/20/2007  DATE OF DISCHARGE:  09/27/2007                               DISCHARGE SUMMARY   DISCHARGE DIAGNOSES:  1. Fever likely viral with upper respiratory infection and bronchitis.      Cannot exclude recurrent Port-A-Catheter infection.  Continue home      IV vancomycin 2 weeks total.  See details below.  2. Diarrhea likely due to viral syndrome, resolved.  C diff negative.  3. 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  Continue medical management as prior to admission.  4. Status post [name] replacement for home use of IV      antiepileptic drugs.  5. Status post vagal nerve stimulator.  6. Panhypopituitarism.  7. Mild mental retardation with microcephaly.  8. Brown syndrome.  9. Chronic hypokalemia and hypophosphatemia.  Continue supplements as      prior to admission.   DISCHARGE MEDICATIONS:  IV vancomycin 500 mg q.8h.  times nine  additional days to administered by family via [name] supplied by  [facility].  Other medications are as prior to admission and  include:  1. Tegretol 250 mg at 7:00 a.m., 1:30 p.m. and 9:00 p.m.  2. Topamax 150 mg at 7:00 a.m., 1:30 p.m. and 9:00 p.m.  3. Keppra 1250 mg b.i.d. at 7:00 a.m. and 9:00 p.m.  4. Synthroid 75 mcg daily at 7:00 a.m.  5. Primidone 125 mg at 7:00 a.m. and 250 mg at 9:00 p.m.  6. Clarinex 5 mg q.p.m.  7. Klor-Con 20 mEq four times daily at 7:00 a.m., 1:30 p.m., 5:00 p.m.      and 9 cm.  8. K-Phos 500  mg four times daily at 7:00 a.m., 1:30 p.m., 5:00 p.m.      and 9:00 p.m.   DISPOSITION:  The patient is discharged to home where he lives with his  family, supportive parents and sister [name] who assist  with his care.   HOSPITAL FOLLOW-UP:  Will be with primary care physician, Dr. Eleonore Chiquito, to call in the next 2-3 weeks for follow-up.  Also with  neurologist, Dr. Ellison Carwin as previously scheduled and as needed.   CONDITION ON DISCHARGE:  Medically stable and improved, afebrile,  tolerating p.o. including antiepileptic drugs and at his baseline.   HOSPITAL COURSE:  1. Fever.  The patient is a 25 year old gentleman with a complicated      past medical history who was brought to the emergency room for      evaluation the day prior to admission for breakthrough seizures in      the setting of fever with coughing.  Eval in the emergency room was      negative so he was sent home.  However, at home he continued with      traumatic coughing, high fevers  and breakthrough seizure activity      and on reevaluation by primary MD office, Dr. Tawanna Cooler on behalf of Dr.      Amador Cunas who was subsequently admitted for further evaluation      and treatment of bronchitis and also due to his nausea, vomiting      and diarrhea associated with this coughing he was unable to      tolerate his antiepileptics and he was seen in consultation by      neurology service to assist with this.  He was given IV fluids and      IV Keppra as well as p.r.n. Ativan and Phenobarbital for his      breakthrough seizure activity.  Due to persistent high-grade fever      of 102 blood cultures were obtained and he was put on IV vancomycin      and Zosyn in addition to his azithromycin which had been started      for bronchitis symptoms.  His chest x-ray remained negative for      pneumonia and further history revealed that the entire family at      home had been sick with similar symptoms related to presumed viral      cause.  As such, the patient remained treated with supportive care      and empiric antibiotics pending stabilization of his respiratory      symptoms.  His one set of blood cultures did return  with a coag      negative staph.  Given his chronic indwelling [name] with      previous history of [name] infection it was determined to      continue a 14-day treatment of IV vancomycin to ensure clearing of      possible port infection.  He was also seen in consultation by      infectious disease who agreed with this plan for recommendation      noting that if recurrent [name] infection is to be documented      repeat blood cultures of the peripheral side and [name] site      to prove this.  He may need a longer duration of IV antibiotics      and/or vanco lock within the [name] to ensure better clearing.      However, at this time the patient's clinical status is markedly      improved.  He is now tolerating his oral home pills including his      normal antiepileptic regimen and the diarrhea, nausea, vomiting      have resolved.  He is ready for discharge home and after a      discussion with family and neurology this is deemed to be an      appropriate plan.  Continue home IV vancomycin via home health      through the [name] as described above.  Greater than 30      minutes on dictation and discharge planning.      Valerie A. Felicity Coyer, MD  Electronically Signed     VAL/MEDQ  D:  09/27/2007  T:  09/27/2007  Job:  [id number]

## 2011-02-04 NOTE — Discharge Summary (Signed)
NAME:  [name], [name] NO.:  [id number]   MEDICAL RECORD NO.:  [id number]          PATIENT TYPE:  INP   LOCATION:  6525                         FACILITY:  [facility]   PHYSICIAN:  Barbette Hair. Artist Pais, DO      DATE OF BIRTH:  [birth year]   DATE OF ADMISSION:  11/18/2008  DATE OF DISCHARGE:  11/20/2008                               DISCHARGE SUMMARY   DISCHARGE DIAGNOSES:  1. Nausea, vomiting, diarrhea, presumed secondary to viral      gastroenteritis.  2. Seizure disorder aggravated by fever and dehydration.  3. History of Brown syndrome.  4. History of mild mental retardation.  5. History of mitral valve prolapse.  6. History of hypothyroidism.   DISCHARGE MEDICATIONS:  1. Clarinex 5 mg once daily.  2. Keppra 1500 mg twice daily.  3. Klor-Con 20 mEq 4 times a day.  4. K-Phos 500 mg 4 times a day.  5. Primidone 250 mg 1/2 tablet in a.m. and 1 in p.m.  6. Synthroid 75 mcg once daily.  7. Topamax 150 mg 3 times day.  8. Tegretol 250 mg 3 times a day.   FOLLOWUP INSTRUCTIONS:  The patient is advised to follow up with Dr.  Amador Cunas within 1 week.  He is to call his office for an appointment.  The patient is advised to push fluids for the next 2-3 days.  He will  resume previous diet as tolerated and follow a BRAT diet if diarrhea  recurs.   HOSPITAL COURSE:  The patient is a 25 year old white male with past  medical history of seizure disorder, growth disorder, and mental  retardation, who presented with acute nausea, vomiting, and diarrhea.  The patient is found to have fever of 101.  He also has a history of  seizure disorder and is on multiple antiepileptics.  The patient had  episode of seizure x4 on the day of admission.  He is followed by  neurologist who advised using Ativan through his [name].  Due to  fever and dehydration, the patient was admitted for further evaluation.  Blood culture x2 was obtained due to concern of possible [name]  infection.   Blood culture showed no growth at time of discharge.  He  improved with IV hydration.  Neurologist saw him and recommended using  Ativan p.r.n. for breakthrough seizures.  He was maintained on his  normal antiepileptics.   On the day of discharge, the patient was tolerating normal diet and was  seizure free x24-48 hours.  His nausea, vomiting, and diarrhea had also  resolved.   LABORATORY RESULTS:  November 18, 2008, WBC 7.4, H&H of 14.2 and 41.2,  and platelet count of 234.  There was mild left shift of neutrophils at  89%.  Basic metabolic profile showed sodium 137, potassium 3.9, chloride  112, bicarb 18, glucose 94, BUN 13, serum creatinine of 1.04.  LFTs were  benign.  Albumin slightly depressed at 3.3.  Calcium was 8.5.  Carbamazepine level was 10.3.  UA was notable for slight protein,  otherwise normal.  At the time of this dictation,  stool studies for C.  diff toxin and stool culture were pending.  Blood culture showed no  growth to date.   X-RAY STUDIES:  Chest x-ray on November 18, 2008, showed no active  disease.  [name] tip was in the upper SVC.   CONDITION ON DISCHARGE:  The patient's nausea, vomiting, and diarrhea  had resolved and was seizure free.  He was felt medically stable for  discharge.      Barbette Hair. Artist Pais, DO  Electronically Signed     RDY/MEDQ  D:  11/20/2008  T:  11/20/2008  Job:  [id number]   cc:   Gordy Savers, MD

## 2011-02-04 NOTE — Discharge Summary (Signed)
NAME:  [name], [name]               ACCOUNT NO.:  [id number]   MEDICAL RECORD NO.:  [id number]          PATIENT TYPE:  INP   LOCATION:  3011                         FACILITY:  [facility]   PHYSICIAN:  Raenette Rover. Felicity Coyer, MDDATE OF BIRTH:  10-Jul-1986   DATE OF ADMISSION:  04/16/2009  DATE OF DISCHARGE:  04/21/2009                               DISCHARGE SUMMARY   DISCHARGE DIAGNOSES:  1. Intractable seizures status post intensive inpatient treatment with      medication adjustments and improved.  Neuro consult appreciated.  2. Coagulase-negative staphylococcus bacteremia likely due to      indwelling [name], nontoxic with negative 2-D echo for source      of embolism.  Continue a total 2-week intravenous antibiotics with      [name] antibiotic lock, see details below.  3. Diarrhea likely antibiotic induced.  Stool C diff negative, has      improved off Zosyn.  4. Hypokalemia and hypophosphatemia, chronic.  Continue replacement.  5. History of mitral valve prolapse.  6. Question aspiration prior to admission.  No evidence of pneumonia.  7. Chronic mild hypotension, asymptomatic at baseline.   DISCHARGE MEDICATIONS:  1. Vancomycin 750 mg IV q.12 h. for additional 10 days and complete 14-      day course.  2. Antibiotic [name] lock with 33 mL of gentamicin, 3 mL vanco,      and 3 mL heparin; to be placed in cath after each antibiotic      infusion   Other medications are as prior to admission include:  1. Clarinex 5 mg daily.  2. Primidone 250 mg p.o. nightly.  3. Tegretol 250 mg t.i.d. chewable.  4. Topamax 150 mg t.i.d.  5. Potassium chloride 20 mEq p.o. q.i.d.  6. Primidone 125 mg p.o. q.a.m. in addition to 250 mg p.o. nightly as      stated above.  7. K-Phos 500 mg p.o. q.i.d.  8. Synthroid 75 mcg daily.  9. Keppra 1500 mg p.o. b.i.d.  10.Seizure study drug p.r.n. as per Dr. Sharene Skeans and study protocol.  11.P.r.n. IV phenobarb and IV Ativan as per home protocol  via Dr.      Sharene Skeans without change.   Hospital followup will be on Monday April 23, 2009, with Dr. Sharene Skeans as  scheduled.  Also with primary care physician, Dr. Eleonore Chiquito, to  call for an appointment next week, telephone number provided [phone].   CONDITION ON DISCHARGE:  Medically stable and anxious for discharge  home.   Hospital plans and discharge instructions have been reviewed with both  parents at length prior to discharge.   HOSPITAL COURSE:  Intractable seizures.  Mr. Lehrmann is a 25 year old  gentleman with history of known epilepsy with frequent breakthrough  seizures, which are often managed by family at home via home medications  and [name] access who was brought to the emergency room on the day  of admission due to inability to control his seizures with the usual  measures at home.  He was running a fever of 102.3 and there was concern  of possible pneumonia given aspiration  during his seizure event.  He was  admitted to the Neuro ICU and neurology consult was obtained to assist  with medical management with administration of antiepileptic drugs IV  and p.r.n.  The seizures were brought under control.  There is  fortunately no evidence of ongoing toxic events and his cultures were  negative and chest x-ray remains normal.  He had no further fevers after  his seizures broke, but blood culture did show a coag-negative staph  contamination, which was felt likely due to [name] colonization.  A  2-D echo was performed, which was negative and repeat blood cultures  were negative.  It is felt that at this time to attempt 2-week IV home  vanco treatment via the [name] to attempt salvage as well as  antibiotic loss as described.  The patient's family have been instructed  on this and the assistance of home health will also be provided  including followup blood work and blood cultures at the conclusion of 2-  week course as previously scheduled.  He will  follow up with his  neurologist, Dr. Sharene Skeans as described and primary care physician next  week sooner if needed.  Please see hospital chart for full details.    Greater than 30 minutes were spent on day of discharge in coordination  for discharge planning and review.      Valerie A. Felicity Coyer, MD  Electronically Signed     VAL/MEDQ  D:  04/21/2009  T:  04/22/2009  Job:  [id number]

## 2011-02-04 NOTE — H&P (Signed)
NAME:  Marcus, Perez NO.:  [id number]   MEDICAL RECORD NO.:  [id number]          PATIENT TYPE:  INP   LOCATION:  6148                         FACILITY:  [facility]   PHYSICIAN:  Genene Churn. Love, M.D.    DATE OF BIRTH:  Jun 30, 1986   DATE OF ADMISSION:  03/16/2007  DATE OF DISCHARGE:                              HISTORY & PHYSICAL   This is one of multiple [facility] hospital admissions for this 25-year-  old right-handed white single male admitted with a history of  intractable seizures, admitted for recurrent nausea, vomiting, and  headache following head trauma.   HISTORY OF PRESENT ILLNESS:  [name] had a single unwitnessed seizure on  Sunday, March 14, 2007, striking his head with questionable loss of  consciousness.  He was seen in the emergency room and admitted.  In his  hospitalization, he was able to get around and walk but had staggering  gait, and the day following discharge, developed recurrent nausea and  vomiting, having 4 episodes of nausea and vomiting while in the x-ray  waiting to have a CT scan.  He also had posterior located headache.  He  underwent CT scan and x-ray, which showed no evidence of subdural  hematoma.  He is now admitted for further evaluation and observation.  [name] has a history of multiple different anticonvulsant trials and is  currently on 4 different anticonvulsants and a vagal nerve stimulator  for his seizures.  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  He has a  known history of allergy to Dilantin which increases his seizures and to  Depakote which causes organ dysfunction and antihistamine which  increases his seizure's frequency.   MEDICATIONS:  Include:  1. Topamax 150 mg 3 times per day.  2. Tegretol 250 mg, 250 mg and 300 mg on a 3 times per day schedule.  3. Keppra 250 mg  at 7 a.m. and 9 p.m.  4. Primidone 125 mg a.m. and 250 mg p.m.  5. Synthroid 75 mcg q.a.m.  6. Clarinex 5 mg q.h.s.  7. Klor-Con 20 mEq at 7:00, 1:30, 5 p.m. and 9 p.m.  8. K-Phos 500 mg at 7 a.m., 1:30 p.m., 5 p.m., and 9 p.m.   Details of his family history have been dictated elsewhere, but his past  surgical procedures have included [name] x2 with recurrent  infections with Staphylococcus epidermidis.   FAMILY HISTORY:  Reveals that his mother has had breast cancer, is in  remission.  His father has had a history of hypertension.   EXAMINATION:  GENERAL:  Well-developed white male who had staggering  gait.  VITAL SIGNS:  Sitting blood pressure, right arm, 90/60 and left arm was  90/60.  Heart rate was 64.  There were no bruits.  MENTAL STATUS:  He was alert and oriented x3.  CRANIAL NERVE EXAMINATION:  Shows left 6th nerve atrophia.  Visual  fields are full.  He had bilateral ptosis.  Left disk was  okay, right  not seen.  Tongue was benign.  Face was symmetric.  MOTOR EXAMINATION:  Weakness in the upper and lower extremities, diffuse  atrophy.  SENSORY EXAMINATION:  Intact to pinprick and light touch, __________,  position, and vibration.  There were no deep tendon reflexes.  His gait  was ataxic.  CARDIOVASCULAR:  He had a systolic murmur.  LUNGS:  Clear.  ABDOMEN:  Examination of the abdomen revealed no increased organomegaly.  He had a right chest [name] and a left chest vagal nerve  stimulator.   IMPRESSION:  1. Gait instability code 781.2.  2. Recurrent nausea and vomiting.  3. 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  4. Intractable seizures code 345.51, 345.41, 345.11.  5. Panhypopituitarism.  6. Hypothyroidism, code 344.9.  7. Brown syndrome.  8. Mitral valve prolapse.   PLAN:  Plan at this time is to admit the patient for observation           ______________________________  Genene Churn. Sandria Manly, M.D.     JML/MEDQ  D:  03/16/2007  T:  03/17/2007  Job:   [id number]   cc:   Clarene Duke, MD

## 2011-02-04 NOTE — H&P (Signed)
NAME:  [name], [name] NO.:  [id number]   MEDICAL RECORD NO.:  [id number]          PATIENT TYPE:  INP   LOCATION:  1839                         FACILITY:  [facility]   PHYSICIAN:  Donalynn Furlong, MD      DATE OF BIRTH:  [birth year]   DATE OF ADMISSION:  11/18/2008  DATE OF DISCHARGE:                              HISTORY & PHYSICAL   PRIMARY CARE Etha Stambaugh:  Dr. Eleonore Chiquito with [facility].   CHIEF COMPLAINT:  Fevers, nausea, vomiting, diarrhea, abdominal pain,  and worsening seizure episodes.   HISTORY OF PRESENT ILLNESS:  [name] is a 25 year old Caucasian male  who lives with his mother and father.  He presented to [facility] ER  today after he started to have nausea, vomiting, and diarrhea yesterday.  He was also found to have a fever since yesterday.  Temperature was  checked by his father and it was around 101 degrees Fahrenheit.  He also  had an episode of seizure yesterday and four episodes today.  He has a  history of seizure disorder in the past.  His neurology physician was  called and they told parents to access the [name] for the delivery  of Ativan which took care of seizures the first time. But due to  subsequent seizures episodes today, his family was advised to bring him  to the ER for the check of the blood work and medication side effect  workup.  The patient's father was present in the room and he gave most  of the history.  He was upset about the delay in the admission orders  due to poor phone system today.  The patient's father mentions that he  does not have any other symptoms.  No neurological focal symptoms at  this time.  The patient has been at his baseline status except the  seizure disorders.  The patient also complained of mild headache after  the seizure and some dizziness.  He has been doing well in the ER except  an episode of diarrhea.   PAST MEDICAL HISTORY:  Positive for a seizure disorder, growth disorder,  mild mental retardation, hypothyroidism, mitral valve prolapse,  irregular heart rate, and Brown's syndrome.   FAMILY HISTORY:  No family history of seizures.  Mother, father, and  sister have migraines.  All grandparents are diabetic.  Father with high  blood pressure.   SOCIAL HISTORY:  The patient goes to the school in the 12th grade at  [employer].  No drug abuse or alcohol or tobacco use noted.   HOME MEDICATIONS:  Home medication list includes Clarinex 5 mg in the  morning, Keppra 1500 mg twice a day, potassium chloride 20 mEq four  times a day, potassium phosphate 500 mg four times a day, Primidone 125  mg in the morning and 250 mg in the evening, Synthroid 75 mcg in the  morning, Tegretol 250 mg three times a day, Topamax 150 mg three times a  day.   ALLERGIES:  Includes Dilantin, Depakote, and diphenhydramine.   REVIEW OF SYSTEMS:  Positive as per  HPI, otherwise negative.  Review of  systems done for 14 systems.   PHYSICAL EXAMINATION:  VITAL SIGNS:  Blood pressure 90/66, pulse rate is  90, respirations 24, temperature 100 degrees Fahrenheit, oxygen  saturation 98%  on room air.  GENERAL:  The patient is sleepy but able to respond appropriately and  answers questions appropriately. oriented to time, place, and person.  CARDIOVASCULAR:  Sinus regular.  No murmur, rub or gallop.  LUNGS:  Clear to auscultation bilaterally.  No wheezing, rhonchi or  crackles.  ABDOMEN:  Nontender, nondistended.  Bowel sounds present.  No  organomegaly.  EXTREMITIES:  No clubbing, cyanosis or edema.  Pulses palpable in all  four extremities.  HEAD:  Normocephalic nontraumatic.  EYES:  Pupils equal and react to light and accommodation.  Extraocular  muscles intact.  ORAL CAVITY:  Oral mucosa moist.  No thrush noted.  NECK:  No thyromegaly or JVD.  NEUROLOGICAL:  Exam shows intact cranial nerves, muscular strength, and  sensation.  SKIN:  No rash or bruits.  The patient does have a  [name] on the  right chest wall which does not look infected.  It is nontender.  The  patient has a vagal nerve stimulator underneath the left side chest wall  also.   LABORATORY DATA:  Lab work shows urinalysis unremarkable for infection.  Carbamazepine level 10.3.  Comprehensive metabolic panel unremarkable  except carbon dioxide 18, albumin 3.3.  Chest x-ray two views  unremarkable.  CBC with differential unremarkable.   ASSESSMENT AND PLAN:  1. New onset fever, nausea, vomiting, diarrhea, and abdominal pain      most likely due to viral gastroenteritis versus other infection      including [name] infection.  2. Worsening episode of seizures with history of seizure disorder.  3. History of Brown's syndrome.  4. History of poor growth.  5. History of mild mental retardation.  6. History of hypothyroidism.  7. History of mitral valve prolapse.  8. History of hypokalemia.   PLAN:  Will admit the patient under Dr. Rene Paci in step-down  unit due to ongoing 4 seizure episodes today and for nausea, vomiting,  fever, and diarrhea.  The patient is full code.  The patient will be on  a regular diet.  We will check neuro check q.4 h.  Will continue IV  normal saline at 100 cc/hour due to hypotension episode earlier.  I will  continue Clarinex, Keppra, potassium chloride, potassium phosphate,  Primidone, Synthroid, Tegretol, and Topamax at home dose.  We will start  Tylenol 500 mg p.o. q.6 h. p.r.n. for fever or pain.  We will also start  p.o. ibuprofen 200 mg q.4 h. p.r.n. for headache or pain.  Will start  Zofran 4 mg q.6 h. IV p.r.n. for vomiting.  We will check stool for ova,  parasites, C.  Diff, and culture.  Will get blood culture, urinalysis,  and urine culture.  Will access [name] for medications and the  blood culture.  We will get one blood culture from [name] and one  from peripheral line.  We will start Ativan p.r.n. for seizure episode.  Further  plan according to workup pending.      Donalynn Furlong, MD  Electronically Signed     TVP/MEDQ  D:  11/18/2008  T:  11/18/2008  Job:  [id number]   cc:   Gordy Savers, MD  Deanna Artis. Sharene Skeans, M.D.

## 2011-02-04 NOTE — Assessment & Plan Note (Signed)
[facility]                                 ON-CALL NOTE   NAME:LOFTISGreyden, Besecker                        MRN:          [id number]  DATE:09/19/2007                            DOB:          July 24, 1986    Dr. Vernon Prey patient   [name]'s mother calls in today stating that he was seen on the 26th in  the office for an acute illness.  He was given a Phenergan injection so  he can keep his Tegretol medication down.  The mother states that he has  been having a fever off and on since then, and today has been high as  103.8.  He does report that he has coughing and congestion.  He is  having difficulty keeping down the Robitussin and his medication.  Mother is concerned that he could have another seizure given the  elevation of his temperature.   PLAN:  Advised mother if his temperature is still elevated and he  continues to have coughing and congestion, I would recommend to take him  to [facility] ED or the Urgent Care.  Advised that if the Urgent Care at  [facility] is not open, she can go ahead and take him to the emergency  department.  Mother expressed understanding.     Leanne Chang, M.D.  Electronically Signed    LA/MedQ  DD: 09/19/2007  DT: 09/19/2007  Job #: [id number]

## 2011-02-07 NOTE — Discharge Summary (Signed)
   NAME:  [name], [name]                     ACCOUNT NO.:  [id number]   MEDICAL RECORD NO.:  [id number]                   PATIENT TYPE:  OBV   LOCATION:  6122                                 FACILITY:  [facility]   PHYSICIAN:  Deanna Artis. Hickling, M.D.           DATE OF BIRTH:  1986/01/08   DATE OF ADMISSION:  07/22/2002  DATE OF DISCHARGE:  07/23/2002                                 DISCHARGE SUMMARY   ADDENDUM:  Laboratory studies during the hospital stay included sodium 139,  potassium 3.2, chloride 112, CO2 20, glucose 120, BUN 10, creatinine 0.6,  calcium 8.3, total protein 6.1, albumin 3.2, SGOT 21, SGPT 18, alkaline  phosphatase 154, total bilirubin 0.4.  CBC:  White count 2900, hemoglobin  10.1, hematocrit 28.8, MCV 78.4, platelet count 218,000, neutrophils 28,  lymphocytes 66, monocytes 5, eosinophils 1, absolute granulocyte count 0.8.  Carbamazepine trough 14.3.  Mysoline 8.  Phenobarbital 11.2.  There are some  atypical lymphocytes.   The patient's hypokalemia is thought to come from the use of Topamax.  He  has had potassium supplementation, but tends to run low potassiums.  White  count is lower than is usual for the patient.  I believe it is related to  the viral syndrome, but will need to a check a CBC.  If white count  continues to drop, we will have to discontinue Tegretol which could be  problematic.   He was discharged home in good condition on Mysoline 250 mg one p.o. b.i.d.,  Tegretol 100 mg 2-1/2 in the morning and 2-1/2 at 1 p.m., three at bedtime,  Topamax 100 mg and 25 mg each one at 8 a.m., 1 p.m. and bedtime, Keppra 1250  (500 mg tablets 2-1/2 at 8 a.m. and bedtime).  Synthroid 0.05 mg one daily,  potassium chloride 20 mEq one four times daily.  He should have activity as  tolerated.  Regular diet.  Will check a CBC with diff next week.  Return  visit to see me as previously ordered.                                                Deanna Artis. Sharene Skeans,  M.D.    [facility]  D:  07/23/2002  T:  07/25/2002  Job:  [id number]

## 2011-02-07 NOTE — H&P (Signed)
[name]. [facility]  Patient:    [name] Visit Number: [id number] MRN: [id number]          Service Type: PED Location: PEDS [phone] Attending Physician:  Lesly Dukes Dictated by:   Marlan Palau, M.D. Admit Date:  09/28/2001                           History and Physical  HISTORY OF PRESENT ILLNESS:  [name] is a 25 year old right-handed white male -- born 1986/07/17 -- with a history of intractable seizures.  This patient is well-known to [facility], has had frequent admissions for intractable seizures and comes in once again for a flurry of seizures.  Patient was last admitted to [facility] on the 30th of December 2002.  Patient has been admitted four times in the last two months.  Patient was just started on Trileptal, taking 150 mg at night, when last seen.  Patient has had some problems in the past with hypokalemia and to some degree, hyponatremia.  Patient is being admitted at this time for management of intractable seizure events.  At home, this patient got 2 mg of oral Valium and 10 mg of rectal diazepam.  PAST MEDICAL HISTORY: 1. Intractable seizure disorder with recent recurrence. 2. Mild mental retardation. 3. Panhypopituitarism; thyroid replacement. 4. History of growth delay. 5. History of vagal nerve stimulator placement.  MEDICATIONS:  Medications at this time include: 1. Synthroid 0.075 mg daily. 2. Tegretol 150 mg in the morning, 200 mg in the evening, 250 mg at nighttime. 3. Keppra 1250 mg b.i.d. 4. Topamax 125 mg t.i.d. 5. Trileptal 150 mg at night.  ALLERGIES:  Patient has allergies to IVP DYE, DEPAKOTE and DILANTIN.  SOCIAL HISTORY:  [place], lives with parents and his older sister.  FAMILY MEDICAL HISTORY:  Notable that no immediate family history of seizure events.  Both parents are alive and well.  REVIEW OF  SYSTEMS:  Notable for some problem with persistent dry nonproductive cough.  He has not had fever or chills; denies headache, visual changes, numbness or weakness on the face, arms or legs.  Patient has been slightly staggery with addition of the Trileptal dose.  PHYSICAL EXAMINATION:  VITALS:  Blood pressure is 94/54, heart rate 112, temperature afebrile, respiratory rate 28.  GENERAL:  This patient is a small-for-age white male who is sleepy but fully conversant at the time of examination.  HEENT:  Head is atraumatic.  Eyes:  Pupils are equal, round and reactive to light.  Disks are flat bilaterally.  NECK:  Supple.  RESPIRATORY:  Clear.  CARDIOVASCULAR:  Examination reveals a regular rate and rhythm without obvious murmurs or rubs noted.  ABDOMEN:  Examination reveals positive bowel sounds, no organomegaly or tenderness noted.  CHEST:  Vagal nerve stimulator is present in the left upper chest; Hickman catheter also present.  EXTREMITIES:  Patient has no significant edema of the extremities.  NEUROLOGIC:  Cranial nerves as above.  Facial symmetry is present.  Patient has good pinprick sensation bilaterally in the face.  There is a normal speech pattern.  Visual fields full.  Patient has good strength in all fours, has good finger-to-nose-to-finger and toe-to-finger.  Deep tendon reflexes are fairly symmetric.  Upgoing toes noted bilaterally.  Patient was not ambulated. Pinprick, soft touch and vibratory sensation are symmetric.  LABORATORY VALUES:  Laboratory  values are pending at this time to include comprehensive metabolic profile, carbamazepine level and CBC differential.  IMPRESSION: 1. History of intractable seizure disorder with recent recurrence. 2. Recent problems with hypokalemia. 3. Panhypopituitarism.  This patient will at this point be admitted for further evaluation.  Plans are to switch patient off of Tegretol to Trileptal and this will be done  during this admission.  PLAN: 1. IV Ativan for the next seizure; may consider IV phenobarbital as well. 2. Initiation of Trileptal. 3. Seizure precautions. 4. Follow electrolytes while on Trileptal. 5. Will follow the patients clinical course while in house. Dictated by:   Marlan Palau, M.D. Attending Physician:  Lesly Dukes DD:  09/28/01 TD:  09/28/01 Job: [id number]

## 2011-02-07 NOTE — Discharge Summary (Signed)
NAME:  [name], [name]                      ACCOUNT NO.:  [id number]   MEDICAL RECORD NO.:  [id number]                   PATIENT TYPE:  INP   LOCATION:  6120                                 FACILITY:  [facility]   PHYSICIAN:  Deanna Artis. Sharene Skeans, M.D.           DATE OF BIRTH:  05-23-1986   DATE OF ADMISSION:  08/14/2002  DATE OF DISCHARGE:  08/15/2002                                 DISCHARGE SUMMARY   FINAL DIAGNOSIS:  Intractable complex partial seizures with secondary  generalization 345.41, 345.11.   PROCEDURE:  None.   COMPLICATIONS:  None.   SUMMARY OF HOSPITALIZATION:  The patient is a 25 year old young man with  intractable complex partial seizures and frequent hospital admissions for  clusters of seizures that do not respond to Diastat and require IV Ativan  and Phenobarbital.  This occurred on the morning of 08/14/02.  The patient  had three left body focal seizures was treated with IV Ativan and  Phenobarbital at the dose of 2 mg of Ativan and 60 mg of Phenobarbital.   This combination brought the seizures under control, but due to significant  postictal stupor, the patient required hospitalization on a 24 hour basis.   The patient was lethargic the entire day and had to be aroused in order to  give him p.o. medications.  For that reason he was kept overnight.  This  morning he is still sleepy, but arousable.  The plan is to stimulate him and  see if he is able to take oral nourishment and his medications and if so,  then discharge him home.  He has had no further seizures.   PHYSICAL EXAMINATION:  VITAL SIGNS:  This morning temperature 96.7, blood  pressure 96/44, resting pulse 73, respirations 24, pulse oximetry 100%.  HEENT:  No signs of infection.  LUNGS:  Clear.  HEART:  Shows a holosystolic murmur at the left sternal border radiating to  the precordium.  Pulses are normal.  ABDOMEN:  Soft, nontender, bowel sounds normal.  EXTREMITIES:  Well formed with heel  cords that are slightly tight  bilaterally.  SKIN:  He has a vagal nerve stimulator in the left chest near the clavicle  and a portacath in the right chest for venous access because peripheral  venous access has been difficult.  NEUROLOGIC:  Sleepy, but arousable.  He follows all commands.  Cranial  nerves sluggishly reactive pupils, visual fields full to counting fingers.  Symmetric facial strength.  Midline tongue.  MOTOR:  The patient has 4+ to 5/5 strength in his arms and legs, ___________  fingers and his toes they grip strongly.  He grips to command strongly with  all x4.  He has a mild peripheral polyneuropathy.  Deep tendon reflexes are  symmetric and virtually absent.  He had no plantar response other than  withdrawal.   DISPOSITION:  The patient is discharged in improved condition.   DISCHARGE MEDICATIONS:  1. Tegretol  350 mg in the morning, and at midday, 300 mg at bedtime.  2. Topamax 125 mg three times a day.  3. Keppra 1250 mg twice a day.  4. Primidone 250 mg twice a day.  5. Synthroid 75 mcg per day.  6. Klor-con 20 mEq four times a day.   In addition to his intractable seizures, the patient also has well-  controlled hypothyroidism and hypokalemia that may be a side effect of  Topamax.   The patient is to return to school 08/16/02, barring any other problems.  He  is to return to a regular diet.  He should keep a return appointment with me  at the regularly scheduled visit.   It is my impression that primidone has neither decreased his seizure  frequency, nor has it prevented hospitalizations.  We will discuss  simplifying his drug regimen by tapering or discontinuing the medication  with his parents.   LABORATORY SUMMARY:  Sodium 136, potassium 3.5, chloride 111, CO2 20,  glucose 94, BUN 10, creatinine 0.6, calcium 8.8, total protein 6.2, albumin  3.3, SGOT 21, SGPT 16, alkaline phosphatase 195, total bilirubin 0.5.   CBC:  WBC 3400, hemoglobin 11.3,  hematocrit 33.7, MCV 79.7, platelet count  262,000.  Neutrophils 29, lymphocytes 65, monocytes 5, eosinophils 1.  Absolute neutrophil count 1000.  Red blood cell morphology shows burr  cells.  No antiepileptic levels were reported.                                               Deanna Artis. Sharene Skeans, M.D.    [facility]  D:  08/15/2002  T:  08/15/2002  Job:  [id number]

## 2011-02-07 NOTE — Discharge Summary (Signed)
NAME:  [name], [name] NO.:  [id number]   MEDICAL RECORD NO.:  [id number]          PATIENT TYPE:  INP   LOCATION:  6148                         FACILITY:  [facility]   PHYSICIAN:  Deanna Artis. Hickling, M.D.DATE OF BIRTH:  November 17, 1985   DATE OF ADMISSION:  07/25/2004  DATE OF DISCHARGE:  07/26/2004                                 DISCHARGE SUMMARY   FINAL DIAGNOSES:  1.  Status epilepticus, 345.3.  2.  Intractable simple and complex partial seizures with secondary      generalization, 345.51, 345.41, 345.11.  3.  Mental retardation, mild.  317.  4.  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  5.  Neuromuscular scoliosis.  6.  Brown's syndrome.  7.  Ventriculoseptal defect.  8.  Status post fracture of the left ankle.   SUMMARY OF THE HOSPITALIZATION:  The patient was admitted because of a  flurry or seizures that occurred in the early morning hours of November 3  that responded for about 8-10 hours to 2 mg of Ativan and 65 mg of  phenobarbital.  When the seizures recurred, and it occurred five times at  home, the family contacted me and I recommended admission.  The patient was  treated again with 2 mg of Ativan and 65 mg of phenobarbital at this time  with cessation of seizures.  He has been seizure free since admission.  He  slept well last night.  He is awake this morning and responsive.   The patient had evidence of bronchitis signs and symptoms since July 15, 2004 and was taking a Z-Pak for a period.  This may have in some way lowered  his seizure threshold either by sleep depriving him or some other way  affecting the pharmacokinetics of the anti-epileptic medicines he takes.   He is discharged on the following medications:  1.  Tegretol 250 mg morning, afternoon and 300 mg at night time.  2.  Topamax 150 mg three times daily.  3.  Keppra 1250 mg twice daily.  4.  Primidone 125 mg in the morning, and 250 at night time.  5.  Synthroid  75 mcg per day.  6.  Klor-Con 20 mEq four times daily.   ALLERGIES:  1.  DILANTIN.  2.  DEPAKOTE.   EXAMINATION TODAY:  VITAL SIGNS:  Temperature 36.4, blood pressure 101/50,  resting pulse 83, respirations 22, pulse oximetry 100%.  HEENT:  Ear, nose, and throat normal.  LUNGS:  Clear.  I do not hear any wheezing or rales today.  He is not in  respiratory distress.  He is not coughing significantly.  HEART:  3/6 systolic ejection murmur less heard at the left sternal border.  Pulses are normal.  ABDOMEN:  Soft, nontender.  Bowel sounds are normal.  EXTREMITIES:  Normal.  He had tight heel cords.  He has a cast on his left  ankle.  NEUROLOGIC:  Awake, mild lethargy, mild mental retardation.  He is following  commands well.  Cranial nerves:  Pupils equal, round, and active.  Visual  fields full.  Extraocular movements full.  Symmetric facial strength.  Midline tongue.  Motor examination:  Mild diffuse weakness, mild clumsiness  in his fine motor movement.  Sensation was intact to cold, vibration, and  stereognosis.  Cerebellar examination:  Good finger-to-nose, rapid  repetitive movements a bit clumsy.  Deep tendon reflexes absent.  He had  bilateral flexor plantar responses.   The patient is discharged home on the same medications.  He has gone over 2  1/2 months since his last hospitalization which is about average for his  current condition.  He will return to his regularly scheduled appointment at  [facility].  He may return to school on Monday.  I have  contacted his sister, [name], who is currently taking care of him as his  parents are out of town.       [facility]/MEDQ  D:  07/26/2004  T:  07/26/2004  Job:  [id number]

## 2011-02-07 NOTE — Discharge Summary (Signed)
[name]. [name]  Patient:    [name]                  MRN: [id number] Adm. Date:  [id number] Disc. Date: [id number] Attending:  Erich Montane                           Discharge Summary  FINAL DIAGNOSES: 1. 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 2. Intractable complex partial seizures, 345.41. 3. Mild mental retardation, 318. 4. Pan hypopituitarism.  PROCEDURES:  None.  COMPLICATIONS:  None.  SUMMARY OF HOSPITALIZATION:  [name] is a 25 year old young man with history of intractable seizures that began around age 89, when he started growth hormone.  He has had cessation of his seizures at the time when growth hormone was withdrawn, but then experienced recurrent seizures when he experienced his growth spurt off of growth hormone.  He was admitted in the early morning hours of June 13, with a 30-minute focal motor seizure.  The patient was witnessed by Dr. Avie Echevaria and had some reduced level of consciousness, but had not fully lost consciousness.  He had had a series of brief, simple, partial seizures that had been aborted using stimulating his vagal nerve stimulator.  However, the seizures persisted and the last was not quelled with stimulation.  The patient was treated with 3 mg of Ativan in the emergency room with cessation of his seizures and sleep.  CURRENT MEDICATIONS: 1. Tegretol 150 mg in the morning, 200 mg at midday, 250 mg at nighttime. 2. Topamax 125 mg 3x a day. 3. Keppra 1000 mg twice a day. 4. Synthroid 0.75 mg per day.  ALLERGIES:  Depakote and Dilantin.  The patients carbamazepine level on admission was 11.7.  PHYSICAL EXAMINATION:  GENERAL:  This morning, on rounds, the patient was very sleepy.  VITAL SIGNS:  Temperature 97.0, blood pressure 118/53, resting pulse 117, respirations 24.  Pulse oximetry 100% on 2 L of oxygen.  LUNGS:  Clear.  HEART:  Showed a holosystolic murmur at the left sternal  border.  ABDOMEN:  Soft, bowel sounds normal.  EXTREMITIES:  Thin.  He had slightly tight heel cords.  NEUROLOGIC:  Awake, sleepy.  He was able to follow some commands.  Pupils were round and reactive.  Visual fields full.  Extraocular movements full and conjugate.  Bilateral congenital ptosis.  Symmetric facial strength, midline tongue.  He coughs with simulation of his vagal nerve stimulator.  Motor, the patient moves all four extremities without full effort.  Fine motor movements were okay.  Sensory exam withdrawn x4.  Deep tendon reflexes were absent.  He had bilateral flexor/plantar responses.  The patient did well all day.  He began to take nourishment and this evening, he was sent home in good condition with return of his examination to baseline. We were not going to change his medication.  We will see him in 1-2 weeks time to re-program his vagal nerve stimulator.  As long as he continues to cough  when it triggers, he is not ready to move up to the next level.  He is discharged in improved condition. DD:  03/04/01 TD:  03/05/01 Job: [id number]

## 2011-02-07 NOTE — Discharge Summary (Signed)
[place]. [facility]  Patient:    [name], [name] Visit Number: [id number] MRN: [id number]          Service Type: MED Location: PEDS [phone] Attending Physician:  Mick Sell Dictated by:   Deanna Artis. Sharene Skeans, M.D. Admit Date:  12/13/2001 Discharge Date: 12/14/2001   CC:         [facility], [address]   Discharge Summary  FINAL DIAGNOSES: 1. Intractable complex partial seizures with intractable simple partial    seizures and secondary generalized tonic-clonic seizures (345.41, 345.51,    345.11). 2. Hypopituitarism. 3. Hypokalemia. 4. Growth delay. 5. Mental retardation. 6. Hypercholesterolemia.  PROCEDURES:  None.  COMPLICATIONS:  None.  SUMMARY OF HOSPITALIZATION:  [name] was admitted after two seizures, 5 hours apart, one with a rather significant fall with closed head injury in the left parieto-occipital region.  The patient had been given Diastat at home.  He was somewhat postictal in the emergency room.  Laboratory studies obtained in the emergency room included carbamazepine 10.5 which represents a peak given that it was taken about an hour after he had his morning medications which were given to him before the patient left from home. Other laboratories: Sodium 138, potassium 3.7 (low range of normal), chloride 111, CO2 19, glucose 101, BUN 11, creatinine 0.6, calcium 8.9.  White blood cell count 3300, hemoglobin 11.4, hematocrit 33.7, MCV 76.0, platelet count 248,000; 51 polys, absolute neutrophil count 1700, 43 lymphs, 5 monos, 1 eosinophil.  The patient gradually improved throughout the day.  Telemetry showed regular sinus rhythm.  He was active and playful with good appetite and was at his neurological baseline.  There have been no further seizures.  PHYSICAL EXAMINATION:  VITAL SIGNS:    Blood pressure 109/48, resting pulse 98, respirations 21, temperature 97.  Pulse  oximetry 100%.  HEENT:  No signs of infection.  LUNGS:  Clear.  HEART: Shows a holosystolic murmur radiating to the precordium.  Pulses normal.  ABDOMEN:  Soft.  Bowel sounds normal.  EXTREMITIES:  Unremarkable.  NEUROLOGIC:  Mental status: The patient was awake, alert, responsive.  Cranial nerves: Round, reactive pupils.  Visual fields were full to double simultaneous stimuli.  The patient had symmetric facial strength.  Midline tongue and uvula.  Motor examination showed normal strength in all four limbs, no drift.  Fine motor movements were normal. Sensory showed a mild peripheral polyneuropathy.  Deep tendon reflexes were absent.  The patient had bilateral flexor plantar responses.  The patient is discharged in improved condition.  We will make no change in his medicine.  He had a trough carbamazepine level this morning which is pending at this time.  I will have this checked, and we will communicate with the family the results later today and make adjustments in Tegretol if possible.  FOLLOWUP:  The patient should return to see me at his regularly scheduled time to our office.  DIET:  He should consume a low-fat diet because of hypercholesterolemia.  DISCHARGE MEDICATIONS: 1. Keppra 500 mg tablets 2-1/2 p.o. b.i.d. 2. Topamax 100 mg tablets 1 p.o. t.i.d. 3. Topamax 25 mg tablets 1 p.o. t.i.d. 4. Tegretol 100 mg tablets 2 p.o. t.i.d. (for the time being). 5. Synthroid 75 mcg 1 p.o. q.d. 6. Potassium chloride 20 mEq q.i.d. (with meals).  ACTIVITY:  He can return to school today.  I discussed this with his parents. ictated by:   Deanna Artis. Sharene Skeans, M.D. Attending Physician:  Sharene Skeans,  Dan Humphreys DD:  12/14/01 TD:  12/14/01 Job: [id number]

## 2011-02-07 NOTE — Discharge Summary (Signed)
[place]. [facility]  Patient:    [name], [name] Visit Number: [id number] MRN: [id number]          Service Type: PED Location: PEDS [phone] Attending Physician:  Mick Sell Dictated by:   Deanna Artis. Sharene Skeans, M.D. Admit Date:  10/24/2001 Discharge Date: 10/25/2001                             Discharge Summary  FINAL DIAGNOSES: 1. Intractable complex partial seizures, secondary generalization, 345.41 and    345.11. 2. Panhypopituitarism. 3. Hypercholesterolemia. 4. Ventricular septal defect (VSD). 5. Hypokalemia.  PROCEDURES:  None.  SUMMARY OF HOSPITALIZATION:  The patient was admitted for the sixth time in six weeks with prolonged generalized tonic-clonic seizure, that this time lasted for 33 minutes and stopped spontaneously.  The patient had onset of typical seizure beginning around 0200 on the morning of February 2.  The patient had bilateral upper extremity tonic activity which punching of his fists and clonic behavior.  He was unresponsive.  He was treated with Diastat 10 mg rectally at 4-5 minutes without effect, and multiple attempts to stimulate his vagal nerve stimulator did not provoke cessation of the seizure.  Despite the fact that the seizures had stopped spontaneously, he was given 60 mg of phenobarbital and 2 mg of Ativan after experience on his last visit showed that if this was not given, that his seizure would recur.  The seizure frequency seems to be getting closer together as we increased Trileptal.  I do not know if this is specific effect of the medication or if discontinuing Tegretol has made that much difference to his seizure control. The overall strategy is to push Trileptal up to tolerability or seizure control.  CURRENT MEDICATIONS: 1. Trileptal 450 mg 3 times a day.  This was just increased this week and    will not be changed. 2. Topamax 125 mg t.i.d. and Keppra 1250 mg b.i.d.  Both of these drugs  are    near their maximum tolerable levels. 3. Synthroid 0.075 mg q.d. 4. Potassium chloride 20 mEq 3 t.i.d.  LABORATORY STUDIES:  Sodium 134 (this is the lowest that it has been and may reflect Trileptal effect on antidiuretic hormone), potassium 3.6 (this is the highest it has been), chloride 110, CO2 20, glucose 114, BUN 15, creatinine 0.6.  Calcium 8.7. Total protein 6.7, albumin 3.2, SGOT 18, SGPT 17, alkaline phosphatase 163, total bilirubin 0.2.  Hemoglobin 11.3, hematocrit 32.9, MCV 79, platelet count 257,000, neutrophils 33, lymphocytes 59, monocytes 5, eosinophils 2, basophils 1, absolute neutrophil count 1900.  The patient has had a persistent cough that is nonproductive.  Nasal washings were negative for influenza A and B antigen.  Urinalysis, specific gravity 1.020, pH 7.0.  Chemistries are negative.  The patient is discharged in improved condition.  His seizures have been under control for over 24 hours.  The decision was made to observe him yesterday because of potential for recurrent seizures.  We will see him in the office later this week to reassess his situation and consider ketogenic diet among other options.  Dictated by:   Deanna Artis. Sharene Skeans, M.D. Attending Physician:  Mick Sell DD:  10/25/01 TD:  10/25/01 Job: [id number]

## 2011-02-07 NOTE — Discharge Summary (Signed)
[place]. [facility]  Patient:    [name], [name] Visit Number: [id number] MRN: [id number]          Service Type: PED Location: PEDS 6150 01 Attending Physician:  Enos Fling Dictated by:   Deanna Artis. Sharene Skeans, M.D. Admit Date:  10/03/2001 Discharge Date: 10/06/2001   CC:         [facility]   Discharge Summary  DATE OF BIRTH:  1986/03/16  FINAL DIAGNOSES: 1. Intractable complex partial seizures. 2. Mild mental retardation. 3. Growth delay with panhypopituitarism.  PROCEDURE:  None.  COMPLICATIONS:  None.  HOSPITAL COURSE:  The patient was admitted with intractable seizures.  This is his second admission in a week.  We switched him from Tegretol to trileptol. On 10/04/01, the patient was admitted and had a series of brief seizures lasting about 10 to 30 seconds.  These were not treated with Ativan and phenobarbital as they had been in the past.  Later that morning, the patient had a series of 11 seizures, all 15 seconds in duration, associated with a guttural sound, eyes rolled up, eyelids closed.  Some times with left facial twitching.  One prior to my evaluation that was associated with postictal left paresis, which cleared.  The patient was treated with Ativan 2 mg and phenobarbital 60 mg, and did well until later that evening when the patient had two seizures 55 minutes apart lasting 10 to 20 seconds, one associated with arching in the back, the other with clenching of his right hand and unresponsiveness.  Decision was made to give him Ativan 2 mg, phenobarbital 60 mg, and he slept through the night having a single 10 second episode with arching of his back, unresponsiveness, tachycardia into the 120s, and clenching of his left fist.  This only lasted for about 10 seconds.  Since that time, over the 24 hours that have passed, he has had no further seizures.  We have increased his trileptol from 150 mg b.i.d. to 300  mg t.i.d., and he has tolerated it very well.  His sodium yesterday was 136.  By oversite, it was not checked today, and will be checked before he is discharged.  DISCHARGE MEDICATIONS: 1. Keppra 1250 mg b.i.d. 2. Topamax 125 mg t.i.d. 3. Trileptol 300 mg t.i.d. 4. Synthroid 0.075 mg q.d. 5. Potassium chloride 10 mEq b.i.d.  PHYSICAL EXAMINATION:  VITAL SIGNS:  Pulse 80, respiratory rate 22, temperature 97.6.  HEENT:  No infections.  LUNGS:  Clear.  HEART:  No murmurs, pulses normal.  EXTREMITIES:  Negative.  NEUROLOGIC:  The patient was mildly lethargic, but near his baseline.  Cranial nerves II-XII grossly intact.  Motor examination showed fairly normal strength with slight decreased effort.  Somewhat clumsy fine motor movements, no drift. Sensory examination intact to primary and cortical modalities.  Gait was slightly broad based, he is areflexic.  The patient is discharged home on the following medications, in addition, he has a vagal nerve stimulator.  The episodes have been too brief to make use of it.  He will return to school on Friday, if he is physically well.  Family will be in touch with me concerning further adjustments of medications as it is needed. Dictated by:   Deanna Artis. Sharene Skeans, M.D. Attending Physician:  Enos Fling DD:  10/06/01 TD:  10/06/01 Job: [id number]

## 2011-02-07 NOTE — Discharge Summary (Signed)
NAME:  [name], [name]               ACCOUNT NO.:  [id number]   MEDICAL RECORD NO.:  [id number]          PATIENT TYPE:  INP   LOCATION:  6150                         FACILITY:  [facility]   PHYSICIAN:  Orie Rout, M.D.DATE OF BIRTH:  1986/03/12   DATE OF ADMISSION:  10/16/2004  DATE OF DISCHARGE:  10/17/2004                                 DISCHARGE SUMMARY   REASON FOR ADMISSION:  Intractable seizures.   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  Also, a seizure disorder  characterized by simple partial seizures secondary generalized.  The patient  presented today after being discharged yesterday from a 24 hour admission  for the same reasons.  He presented with continued seizure activity that was  described by his sister like a sensation in the left arm with the left eye  twitching and then left hand clinching, for an episode lasting 20-30  seconds.  At that time, the patient was in a shopping mall with his sister  and she could not provide any medications for him in that specific place, so  they came directly to the emergency department.  In the emergency  department, the patient received 2 mg Ativan and 65 mg Phenobarbital and was  sent to the floor for 24 hour observation.  On the floor, the patient  continued home regimen and his condition improved.  On the day of discharge,  the patient was awake and alert.  There was never postictal state for him  and he did not have more episodes of seizures since he was on the floor.  Dr. Sharene Skeans, his neurologist, has been following this patient closely and  during this admission, as well.  His sister reported that he has been  afebrile, eating well, spent some time in the play room.  His temperature  was 36.1, heart rate 72,  respiratory rate 22-24, blood pressure 95/52, O2  saturation 100% on room air, and he had urinary output of 2+ mL per kilo per  hour.  In general, he was awake, alert, and pleasant, playful.  HEENT clear.  Cardiovascular had a regular rate and rhythm.  Systolic ejection murmur,  3/6.  Good peripheral pulses and good capillary refill.  Lungs were clear to  auscultation bilaterally and the area around the port of the cath was clear  and draped.  The abdomen was soft, nontender, nondistended, positive bowel  sounds, no masses, no organomegaly.  Neurological revealed he was awake,  alert, playful, cooperative.  He had deep tendon reflexes in the left lower  extremity, difficult to assess in the rest of the extremities.  Decreased  muscular tone diffusely.  He was moving all his extremities and no  focalizations.  The patient continued on home regimen and one issue was at  the previous admission, the patient had a TSH level that was low.  Today, we  have drawn a free T4 level that we still do not have the results  on the day  of discharge.  We called Dr. Clarene Duke, his primary care physician, and Dr.  Clarene Duke will be in contact with Dr. Neale Burly at [facility] who is the  endocrinologist following, and they will decide if there are any changes in  the Synthroid dose that the patient has is necessary and Dr. Clarene Duke will  contact the family about it.  Also, we drew a Tegretol level at the request  of Dr. Sharene Skeans and today, January 26, Tegretol level was 10.1, today was  11.4, they are both optimal, and Dr. Sharene Skeans decided not to increase any of  his medications at this time.  The patient is going to be sent home in  improved condition.  Treatment consisted of Ativan 2 mg and Phenobarbital 65  mg and the patient was back to home regimen.   OPERATIONS AND PROCEDURE:  None.   FINAL DIAGNOSIS:  1.  Recurrent simple partial seizures secondary generalized.  2.  Hypothyroidism.  3.  Growth delay.  4.  Brown's  syndrome.  5.  Ventricular septal defect.  6.  Mitral valve prolapse.  7.  Scoliosis.   DISCHARGE MEDICATIONS:  The same as home regimen, Tegretol 250 mg at 7 a.m.,  1 p.m., 3 p.m., and 9 p.m., Topamax 125 mg b.i.d., Keppra 1250 mg b.i.d.,  Synthroid 75 mcg daily, Mysoline 125 mg in a.m. and 250 mg at night,  potassium 20 mEq at 7 a.m., 1 p.m., 5 p.m., and 9 p.m.   PENDING RESULTS TO BE FOLLOWED:  Free T4 is pending and results will be  faxed to Dr. Clarene Duke.   FOLLOW UP:  The patient should follow up with Dr. Sharene Skeans on the regular  scheduled appointment.  Discharge weight 34 kilograms.   CONDITION ON DISCHARGE:  Improved.      AM/MEDQ  D:  10/17/2004  T:  10/17/2004  Job:  [id number]

## 2011-02-07 NOTE — Discharge Summary (Signed)
NAMENOAL, ABSHIER 111                     ACCOUNT NO.:  [id number]   MEDICAL RECORD NO.:  [id number]                   PATIENT TYPE:  INP   LOCATION:  6148                                 FACILITY:  [facility]   PHYSICIAN:  Deanna Artis. Sharene Skeans, M.D.           DATE OF BIRTH:  September 01, 1986   DATE OF ADMISSION:  11/05/2003  DATE OF DISCHARGE:  11/06/2003                                 DISCHARGE SUMMARY   FINAL DIAGNOSES:  1. Intractable simple and complex partial seizures with secondary     generalization.  2. Ventriculoseptal defect.  3. Hypopituitarism with hypothyroidism and growth failure.  4. Brown syndrome.   PROCEDURES:  None.   SUMMARY OF THE HOSPITALIZATION:  The patient is a 25 year old young man well-  known to me with a history of seizures since age 74, who has been on a  variety of medical regimens.  He was at church on the 13th when he had  warning of impending seizure.  He came to his father and then had focal  jerking of the right arm.  He was taken from church by car and had a second  episode en route to the hospital.  In the hospital, he said that he was  going to have a third one and may have had a somatosensory episode but did  not have a motor seizure.  He was treated with phenobarbital 65 mg and  Ativan 2 mg and has had no seizures since that time.  The patient has felt  well.  There are no underlying acute medical problems that he has.  He was  admitted by the pediatric service and then I saw him in consultation and  agreed to take his care.   PHYSICAL EXAMINATION:  VITAL SIGNS:  On examination today, temperature 36.6,  resting pulse 102, respirations 27, blood pressure 96/43, pulse oximetry  99%.  LUNGS:  Clear.  HEART:  Heart shows a holosystolic murmur at the left sternal border.  Pulses are normal.  ABDOMEN:  Soft.  Bowel sounds normal.  EXTREMITIES:  Extremities were thin.  He has slightly tight heel cords.  NEUROLOGIC:  Awake, mildly sluggish, smiling,  following commands.  Cranial  nerves:  Round reactive pupils.  Visual fields full to double simultaneous  stimuli.  Symmetric facial strength.  Midline tongue and uvula.  Motor  examination:  Normal strength, no drift.  Fine motor movements normal.  Sensory examination:  Mild peripheral polyneuropathy.  Gait not tested.  Deep tendon reflexes were absent.   The patient is ready for discharge.   DISCHARGE MEDICATIONS:  His discharge medications will include:  1. Mysoline 125 mg at 7 a.m., 250 mg at 9 p.m.  2. Tegretol 250 mg -- 7 a.m., 1 p.m.; 300 mg at 9 p.m.  3. Topamax 150 mg at 7 a.m., 1 p.m. and 9 p.m.  4. Synthroid 75 mcg at 7 a.m.  5. Keppra 750 mg at 7 a.m.  and 9 p.m.  6. Klor-Con 20 mEq -- 7 a.m., 1 p.m., 6 p.m. and 9 p.m.   CONDITION ON DISCHARGE:  The patient is discharged in improved condition and  can return to school today or tomorrow.  He also has a vagal nerve  stimulator and will be seen in my office for ongoing evaluation at his  regularly scheduled time.  He has done rather well in terms of both seizures  and need for hospitalization on his current regimen, so there is no reason  to change it.   LABORATORY STUDIES:  Carbamazepine was 9.3.  The others are not available at  this time.                                                Deanna Artis. Sharene Skeans, M.D.    [facility]  D:  11/06/2003  T:  11/06/2003  Job:  [id number]

## 2011-02-07 NOTE — H&P (Signed)
[name], [name] 111                     ACCOUNT NO.:  [id number]   MEDICAL RECORD NO.:  [id number]                   PATIENT TYPE:  INP   LOCATION:  6148                                 FACILITY:  [facility]   PHYSICIAN:  Melvyn Novas, M.D.               DATE OF BIRTH:  02-19-1986   DATE OF ADMISSION:  05/17/2003  DATE OF DISCHARGE:                                HISTORY & PHYSICAL   ATTENDING PHYSICIAN:  Melvyn Novas, M.D.   HISTORY OF PRESENT ILLNESS:  [name] is a well established patient  with the Neurology service.  Orvilla Fus is admitted today after having a night  with seizure activity and having received his usual 65 mg of phenobarbital  by IV [name] at home as well as 2 mg of Ativan.  Apparently he had  recurrent seizures since noon, occurring in a cluster that the patient  describes as sensory seizures and that his parents described as partial  complex.  Tonic/clonic activity was no longer seen when the patient arrived  in the ER and was evaluated by the pediatric residents but the patient has  not returned back to his baseline and his parents state that his mental  status is still impaired. He had not developed any generalization of  seizures today.  He has also no recent injuries, did not fall at home, has  no fever and review of systems is, otherwise, negative.  The [name]  looks clean. The area site is not infected, inflamed or reddened.   The patient has a long history of complex partial seizures, intractable, and  has had multiple trials with anticonvulsant medications. Finally a Port-A-  Cath was implanted so that the patient could use IV benzodiazepines and  phenobarbital at home that have, so far, proven to be the most sufficient in  treating his frequent spells.  The patient has had seizures in the past when  his threshold was lowered by febrile illness.  He had failed treatment with  Tegretol, had developed a hepatic reaction to Depakote,  could not tolerate  Dilantin and was weaned off those medications but still takes:   MEDICATIONS:  1. Topiramate 150 mg t.i.d.  2. Tegretol 350 mg in the morning and 300 at night.  3. Keppra 1.25 g b.i.d.  4. Mysoline 125 in the morning, 250 at night.  5. Synthroid 0.75 mcg daily.  6. K-Dur 20 mEq q.i.d.   PHYSICAL EXAMINATION:  VITAL SIGNS:  Blood pressure on the right and left  arm comparable at 115/60, heart rate 98 regular, afebrile.  NECK:  There are no bruits.  LUNGS:  Clear to auscultation.  ABDOMEN:  Soft and nontender, no episternal tenderness, no flank tenderness.  No trouble passing urine or stool.  NEUROLOGICAL:  Mental status:  The patient appears sleepy but is arousable  and once aroused, able to converse in his normal baseline manner.  He  follows simple step commands.  The cranial nerves evaluation shows normal  visual fields, flat discs, pupillary reaction bilaterally equal.  No facial  weakness or sensory loss.  Tongue and uvula midline.  The patient has  dysarthria by baseline and a ptosis by baseline.  Motor examination:  Bilateral ulnar deviation of the hands and could give me equal grip  strength, neck flexion and extension and lower extremity movements. Deep  tendon reflexes were absent.  He had preserved pinprick and temperature  feeling in all extremities.    IMPRESSION:  1. Intractable complex partial seizures, 345141.  2. Growth delay.  Failure to thrive.  783.4  3. 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  4. Hypothyroidism and gross hormone deficiency.  244.9.  5. Hypokalemia supposedly promoted by topiramate, 276.8, in the past.   PLAN:  The patient will be admitted for 23 hour observation.                                                Melvyn Novas, M.D.    CD/MEDQ  D:  05/17/2003  T:  05/17/2003  Job:  [id number]

## 2011-02-07 NOTE — H&P (Signed)
[place]. [facility]  Patient:    [name], [name] Visit Number: [id number] MRN: [id number]          Service Type: MED Location: PEDS 6151 01 Attending Physician:  Lesly Dukes Dictated by:   Marlan Palau, M.D. Admit Date:  09/03/2001   CC:         [facility]   History and Physical  HISTORY OF PRESENT ILLNESS:  [name] is a 25 year old, right-handed, white male born Apr 10, 1986, with a history of intractable seizures.  The patient returns to the [facility] for a series of seizures today, totaling 12, with four to six occurring in the emergency room. The patient has had nightly seizures over the last couple of weeks.  He was last seen and admitted to the emergency room on August 18, 2001.  The patient is being admitted at this point for observation and management of seizures.  The patient has received Ativan 0.2 mg/kg and phenobarbital 55 mg. The patient was given medication through the [name].  Tegretol level is around 11 at this time.  PAST MEDICAL HISTORY: 1. History of intractable seizure disorder with history of recent recurrence. 2. Mild mental retardation. 3. Panhypopituitarism with growth delay. 4. History of vagal nerve stimulator motor placement in May 2002. 5. History of hypothyroidism.  CURRENT MEDICATIONS: 1. Topamax 125 mg t.i.d. 2. _____ 1250 mg b.i.d. 3. Tegretol 150 mg in the morning, 200 at midday, and 250 mg in the evening. 4. Synthroid 0.075 mg q.d.  ALLERGIES:  DILANTIN, DEPAKOTE.  SOCIAL HISTORY:  Does not smoke or drink.  The patient lives at home with parents and is in school.  He has one sister with history of migraine.  FAMILY HISTORY:  Notable that there is no significant history of seizures in the family.  Sister has history of migraines.  REVIEW OF SYSTEMS:  Notable for no recent fevers or chills.  The patient has had dry nonproductive cough.  He has  had some headache earlier.  Denies shortness of breath.  The patient has no fevers, cough, again nonproductive, no diarrhea or skin rash.  PHYSICAL EXAMINATION:  VITAL SIGNS:  Blood pressure is 121/77, heart rate 112, respiratory rate 18, and temperature afebrile.  GENERAL:  This patient is a well-developed white male, small for age, who is currently sleepy following phenobarbital administration.  HEENT:  Head is atraumatic.  Eyes:  Pupils equal, round, and reactive to light.  Discs soft and flat bilaterally.  NECK:  Supple and no carotid bruits noted.  LUNGS:  Clear.  HEART:  Regular rate and rhythm without obvious murmurs or rubs noted.  ABDOMEN:  Positive bowel sounds without organomegaly or tenderness noted.  EXTREMITIES:  Without significant edema.  NEUROLOGIC:  Cranial nerves as above.  Facial symmetry is present.  The patient has full extraocular movements, blinks to threat bilaterally, and again somewhat drowsy.  The patient moves all fours.  Deep tendon reflexes are present, but symmetric.  Toes are neutral bilaterally.  The patient is not alert enough to follow cerebellar testing.  The patient was not ambulated.  LABORATORY:  Sodium 138, potassium 3.0, chloride 111, BUN 12, glucose 115. The pH was 7.391, pCO2 28.3, bicarb 17.  Hemoglobin 11, hematocrit 33. Calcium 9.0, magnesium 1.9, _____ 2.5, carbamazepine level of 11.3.  IMPRESSION: 1. History of intractable seizures with recent recurrence. 2. Mild mental retardation. 3. Growth delay.  PLAN:  The patient is being admitted for  evaluation of the seizure event.  The patient has occasional flurries of significant seizures.  The patient will be brought in for observation and treatment at this point.  1. Admission to [facility]. 2. Intravenous phenobarbital 60 mg x 1 dose for recurrent seizures. 3. Continue oral medications. 4. Seizure precautions.  Will follow the patients clinical course while     in-house. Dictated by:   Marlan Palau, M.D. Attending Physician:  Lesly Dukes DD:  09/03/01 TD:  09/03/01 Job: [id number]

## 2011-02-07 NOTE — H&P (Signed)
NAME:  [name]                     ACCOUNT NO.:  [id number]   MEDICAL RECORD NO.:  [id number]                   PATIENT TYPE:  INP   LOCATION:  6704                                 FACILITY:  [facility]   PHYSICIAN:  Deanna Artis. Sharene Skeans, M.D.           DATE OF BIRTH:  09-Apr-1986   DATE OF ADMISSION:  05/09/2002  DATE OF DISCHARGE:  05/10/2002                                HISTORY & PHYSICAL   CHIEF COMPLAINT:  A 40-minute seizure.   HISTORY OF THE PRESENT CONDITION:  [name] is a 25 year old, right-handed,  Caucasian male with a 40-minute episode of status epilepticus complex  partial with secondary generalization.  Patient had 5 seizures beginning  last night, 2 in the evening treated with diazepam 2 mg, a third one around  5:00 in the morning.  He went to school and then around 2:30 he had the  first of three 1-2 minute seizures that were generalized in nature, given  diazepam after the second seizure.  He then went into a prolonged 40-minute  seizure that involved both aspects of complex partial seizures with  secondary generalization with staring, jerking, and unresponsiveness.   The patient has had 4 or 5 seizures in the last week before this flurry.  He  was seen last week in the office and no changes were made in his medicines  or vagal nerve stimulator.   PAST MEDICAL HISTORY:  The patient has a 7-year history of seizures dating  back to the introduction of growth hormone.  He had a workup that shows  multifocal seizures at onset with video EEG.  This was done at [facility].  MRI  scan failed to show definite dysgenesis or migratory disorders.  Patient  does have evidence of bilateral basal ganglia calcification.  He has had  evaluations for _________ metabolism including MELAS chromosomal workup,  dysgenetic, or structural abnormalities both in [place] at [facility] and at [facility].  These were all  negative.   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  Patient has hypothyroidism that is well treated  with Synthroid.  He had growth hormone deficiency. Curiously, when growth  hormone was stopped at about age 65 or 32, he had a period of 8 or 9 months  where he had very few seizures.  His growth then began spontaneously and  seizures recurred and have not stopped since then.   Patient has failed Lamictal and Felbatol.  We have been unable to taper  Tegretol without increasing seizures.  He did not tolerate Depakote which  caused nausea, vomiting, acidosis, and delirium and led to the MELAS workup.  He had delirium from Dilantin and I believe also had a rash.   CURRENT MEDICATIONS:  1. Tegretol 100-mg tablets 2 1/2 to 3 at 7:00, 1:00, and 8:00.  2. Topamax 100 t.i.d. and 25 t.i.d. at the same time.  3. Keppra 500-mg tablets 2 1/2 at 7:00, 2 1/2 at 8:00 p.m.  4. He also takes Synthroid 75 mcg per day.  5. He is on Klor-Con (potassium chloride) 20 mEq at 7:00, 1:00, 5:00 and     8:00, the first 3 with meals.   He is on a vagal nerve stimulator with a rapid cycle near the maximum (2.25  milliamps, I think).   REVIEW OF SYSTEMS:  The patient had diarrhea last night.  He has been  compliant with his medicines and he has had good appetite and normal sleep  up until last night.  He has had no fever, rash, or other infections.  No  injuries.  Review of systems is otherwise negative.   PAST MEDICAL HISTORY:  See above.   FAMILY HISTORY:  No seizures, mental retardation, cerebral palsy, blindness,  deafness, birth defects, or consanguinity.  His sister has migraines.  Parents are both healthy.  All 4 grandparents have diabetes mellitus.  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  He also has some very  tiny cataracts recently seen by Dr. Sheffield Slider on routine evaluation.   SOCIAL HISTORY:  The patient is in middle school at [employer] in a self-  contained class.  He likes Research scientist (life sciences).  He does not participate in sports.   PHYSICAL EXAMINATION:  GENERAL:  [name] was postictal lying on a stretcher,  nonetheless responsive though lethargic.  VITAL SIGNS:  Temperature 99.6, pulse 90, respirations 28, blood pressure  140/60.  He is right-handed.  HEENT:  Ears, nose, and throat no infection.  Supple neck, full range of  motion.  CHEST:  Lungs clear.  CARDIAC:  Heart showed a holosystolic murmur.  Pulses are normal.  ABDOMEN:  Soft.  Bowel sounds normal.  EXTREMITIES:  Negative for edema, cyanosis, alterations in tone.  He does  have decreased mass.  He does not have tight heel cords.  NEUROLOGIC EXAMINATION AND MENTAL STATUS: The patient was lethargic and  slowly followed commands.  His speech was slurred but not unintelligible.  Cranial nerves: Reactive pupils.  Fundi normal.  Visual fields full to  counting fingers.  Symmetrically _________.  Midline tongue and uvula.  Air  conduction greater than bone conduction bilaterally.  Motor examination:  Patient showed basically normal strength, decreased mass, no drift.  Fine  motor movements were clumsy.  No focal abnormalities were evident.  Sensory  examination was intact to primary and also cortical modality.  Cerebellar  examination: Good finger-to-nose.  Rapid motor movement was somewhat slow.  He was not dystaxic.  Gait was not tested.  Deep tendon reflexes were  absent.  He had bilateral flexor plantar responses.   IMPRESSION:  1. Status epilepticus (345.3).  2. 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 3. 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  Etiology of this is unknown.   PLAN:  We will continue his regular medications and start Mysoline at a dose  of 25 mg in the morning and at nighttime.  The intention is to slowly  increase this to see if we can use a barbiturate to bring about  seizure  control.  I am concerned that this will also make him sleepy and may  actually lead to increased seizures rather than improved seizure control.   Nonetheless, the patient has been admitted about every 3 weeks.  It is clear  that something else needs to be done to see if we can stop these seizures  and improve the quality of his life.  Patient will be observed overnight and  if he has no seizures he will be discharged in the morning.                                               Deanna Artis. Sharene Skeans, M.D.    [facility]  D:  05/09/2002  T:  05/11/2002  Job:  [id number]

## 2011-02-07 NOTE — Discharge Summary (Signed)
NAME:  [name], [name] NO.:  [id number]   MEDICAL RECORD NO.:  [id number]          PATIENT TYPE:  INP   LOCATION:  6148                         FACILITY:  [facility]   PHYSICIAN:  Henrietta Hoover, MD    DATE OF BIRTH:  [birth year]   DATE OF ADMISSION:  07/24/2005  DATE OF DISCHARGE:  07/27/2005                                 DISCHARGE SUMMARY   PRIMARY CARE PHYSICIAN:  Fonnie Mu, M.D.   FINAL DIAGNOSES:  1.  Seizure disorder.  2.  Growth disorder.  3.  Mild mental retardation.  4.  Hypothyroidism.  5.  Brown's syndrome.  6.  Mitral valve prolapse.   REASON FOR ADMISSION:  Seizure.   On July 23, 2005, at 2340 p.m., the patient had a seizure and then he  had a cluster of five to six seizures on July 24, 2005, at 6:40 a.m.  The family was going to give the patient his standing Ativan and  phenobarbital but the family was unable to give the medicine because their  heparin had expired, so they brought the patient to [facility] ED.  En route to the emergency room, the patient fell and hit his  head and had apnea for two minutes and sister reported that the patient's  lips turned blue.   Significant findings at [facility] were CBC with white  blood cell count 3.7, hemoglobin 13.6, hematocrit 14, platelets 265,  absolute neutrophil count 1.8.  Sodium 137, potassium 3.4, chloride 110, BUN  7, creatinine 0.6 and glucose of 125.  Urinalysis was within normal limits  except for glucose of 100+.  Carbamazepine level was 11 and blood culture  drawn from the port grew coag negative Staph that was sensitive to  gentamicin, Rifampin, levofloxacin and tetracycline.   Treatment for the seizures, the patient was given 2 mg diazepam p.o. at  home.  At [facility], he was given Ativan 2 mg IV and  phenobarbital 65 mg IV.  The patient had five to six more seizures, each  were less than 15 seconds and then  there was resolution on July 24, 2005.  The patient had no further seizures in the hospital after this  treatment.   The patient was admitted for 24-hour observation, but then the blood culture  came back positive.  He was treated with vancomycin 750 mg IV q.12h. for two  days.   There were no operations or procedures.   FINAL DIAGNOSES:  1.  Seizure disorder.  2.  Hypothyroidism.  3.  Mild mental retardation.  4.  Mitral valve prolapse.   DISCHARGE MEDICATIONS:  1.  Begin home medication, Tegretol 250 mg b.i.d. then 300 mg nightly p.o.  2.  Topamax 150 mg p.o. t.i.d.  3.  Keppra 1250 mg p.o. b.i.d.  4.  Synthroid 75 mcg q.a.m. p.o.  5.  Primidone 125 mg p.o. q.a.m. then 250 mg p.o. nightly.  6.  Clarinex 500 mg nightly.  7.  Klor-Con 20 mEq q.i.d.  8.  K-Phos 500 mg q.i.d.  9.  Treatment for the coag negative Staph outpatient is levofloxacin 500 mg      IV q.24h.  The patient will be treated with levofloxacin until August 06, 2005, outpatient.  No results or issues need to be followed up.  The      patient will meet with Dr. Clarene Duke on Monday, July 28, 2005, at 9      a.m.   CONDITION ON DISCHARGE:  Good.     ______________________________  Pediatrics Resident    ______________________________  Henrietta Hoover, MD    PR/MEDQ  D:  07/27/2005  T:  07/28/2005  Job:  [id number]   cc:   Fonnie Mu, M.D.  Fax: [phone]

## 2011-02-07 NOTE — Consult Note (Signed)
NAME:  [name], [name] 89                     ACCOUNT NO.:  [id number]   MEDICAL RECORD NO.:  [id number]                   PATIENT TYPE:  INP   LOCATION:  6150                                 FACILITY:  [facility]   PHYSICIAN:  Santina Evans A. Orlin Hilding, M.D.          DATE OF BIRTH:  11-01-1985   DATE OF CONSULTATION:  02/14/2004  DATE OF DISCHARGE:  02/14/2004                                   CONSULTATION   CHIEF COMPLAINT:  Seizure.   HISTORY OF PRESENT ILLNESS:  Mr. Demarcus, Thielke, is a 25 year old white male  who is a patient of Dr. Darl Householder with intractable seizure disorder on a  quadruple regimen of Tegretol, Topamax, Keppra and mysoline as well as a  vagal nerve stimulator.  He had a prolonged seizure on the school bus today.  EMS was called.  Seizure stopped, but then he had several subsequent smaller  seizures so he was brought to the emergency room where he received Ativan  and phenobarbital according to a protocol outlined for him.  Seizures were  controlled but he is now too postictal to go home.   REVIEW OF SYSTEMS:  Is unobtainable.  His parents are not present at this  time.   PAST MEDICAL HISTORY:  Significant for the known chronic intractable seizure  disorder, on four meds with a vagal nerve stimulator.  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   MEDICATIONS:  1. Tegretol 250 mg b.i.d. and 300 q.h.s.  2. Topamax 150 mg t.i.d.  3. Keppra 1250 b.i.d.  4. Mysoline 125 mg in the morning and 250 at night.  5. Synthroid.  6. Potassium.   ALLERGIES:  1. DEPAKOTE.  2. DILANTIN.   SOCIAL HISTORY:  He lives at home with his parents and siblings.  He does  attend school.   FAMILY HISTORY:  Noncontributory.   EXAM:  VITAL SIGNS:  Temperature 36.2, pulse 123, BP 123/66, respirations  22, 99% sat.  He is snoring.  He can be aroused to open his  eyes but drifts  back off immediately.  He is not verbally responsive for me or following  commands at this time.  He has been sedated with phenobarbital and Ativan  for control of his seizure.  CRANIAL NERVES:  Pupils are equal and reactive.  He has full extraocular  movements to oculocephalic maneuvers.  There is no facial asymmetry.  MOTOR EXAM:  Not a lot of spontaneous movement.  He does not follow  commands.  He does withdraw to pain in all four extremities.  Deep tendon  reflexes are diminished with downgoing toes.  Coordination:  He is not able  to cooperate.  Sensory:  He withdraws to pain all fours.   IMPRESSION:  Breakthrough seizure in 25 year old with chronic intractable  seizure disorder with vagal nerve stimulator and a four drug regimen.  Seizure was  aborted with Ativan and phenobarbital but he is now postictal  and sedated.  Exam is nonfocal, however.   RECOMMENDATIONS:  No change in his regimen at this time.  I will discuss  with Dr. Sharene Skeans.  May need adjustment in his vagal nerve stimulator.  If  no further seizures occur and he comes around quickly, it will be alright to  discharge him tonight or in the morning.                                               Catherine A. Orlin Hilding, M.D.    CA[facility]/MEDQ  D:  02/14/2004  T:  02/15/2004  Job:  [id number]   cc:   Deanna Artis. Sharene Skeans, M.D.  [address]  [address]  [place]  [place] [address]  Fax: [phone]

## 2011-02-07 NOTE — Discharge Summary (Signed)
NAME:  [name], [name]                     ACCOUNT NO.:  [id number]   MEDICAL RECORD NO.:  [id number]                   PATIENT TYPE:  INP   LOCATION:                                       FACILITY:  [facility]   PHYSICIAN:  Deanna Artis. Sharene Skeans, M.D.           DATE OF BIRTH:  1985-10-09   DATE OF ADMISSION:  11/16/2002  DATE OF DISCHARGE:  11/18/2002                                 DISCHARGE SUMMARY   FINAL DIAGNOSES:  1. Retractable simplex and complex partial seizures with secondary     generalization 345.51, 345.41, 345.11.  2. Failure to thrive 73.42.  3. Organic gait disorder 71.2.  4. Nonspecific urethritis.   PROCEDURE:  None.   COMPLICATIONS:  None.   HOSPITAL COURSE:  The patient was admitted to the hospital with onset of  seizures at 0400 on the day of admission.  They were stopped with 2 mg of  Ativan and 60 mg of phenobarbital IV.  The patient was brought to the  hospital about 13 hours later having had recurrent seizures at home.  He  apparently remained in the emergency room between 5:00 p.m. and 6:00 p.m.  with no treatment for his seizures, having had at least 12 seizures during  that time period.  He was finally given Ativan 2 mg, phenobarbital 60 mg and  had another eight seizures afterwards.  My partner, Dr. Jacki Cones, was  called at 7:00 p.m.   The patient was postictal, moving all extremities.  The patient has not had  any recent illness.  He had struck his head on the day prior to admission  without loss of consciousness.  The patient had low grade fever in the  emergency room of 100.4.  He had a non focused examination.   The second day of hospitalization the patient was quite somnolent and in the  postictal state.  He had nonfocal examination, temperature down to 97.9.  The patient had some low grade fever during the rest of that day.  A  urinalysis showed evidence of white blood cell count of 7-10, small  leukocyte esterase, negative nitrates.  I  felt this was likely related to  nonspecific urethritis and did not treat it.  For persistent fever and  somnolence the patient was placed on Bactrim 40 mg/200 mg one p.o. b.i.d.  and tolerated it well.  We will continue this until urine culture returns.  The patient's a.m. toxic drug level that was tapped was carbamazepine 11.1  mcg/ml.  White count 7,300, hemoglobin 10.9, hematocrit 32.4, MCV 76.8,  platelet count 297,000, sodium 6, polys 18, lymphs 5, monos and the absolute  granulocyte count was 5,600.   ISTAT - sodium 138, potassium 3.4, chloride 111, BUN 8, glucose 103. PH  7.35. Hemoglobin 13, hematocrit 37.0.  Urinalysis - specific gravity 1.021,  pH 7.5, trace hemoglobin, small leukocyte esterase , 7-10 white blood cells,  0-2 red blood cells.  Urine culture at this time is pending.  A valproic  acid level was drawn however, the patient is not on Depakote.   PHYSICAL EXAMINATION:  VITALS:  On examination today temperature 98.3, blood  pressure 85/50, pulse 94, respirations 28, pulse oximetry 99% on room air.  LUNGS:  Clear.  HEART:  Holosystolic murmur.  Pulses normal.  ABDOMEN:  Soft, no hepatosplenomegaly.  EXTREMITIES:  Negative with mild wasting.  NEUROLOGIC:  The patient was awake, somewhat somnolent.  He follows  commands.  Pupils are round and reactive.  Visual fields are full.  Symmetric facial strength.  Midline tongue.  Motor examination:  4+, 5/5  strength with some clumsiness in fine motor movements.  Sensory examination  ________ x4.  Deep tendon reflexes were diminished, absent.  Sensation is  very near baseline.   DISCHARGE INSTRUCTIONS:  He will be discharged home.  We will increase  Topamax to 125, 125, 150.  No other changes in his current medications which  will include Keppra 1250 mg twice a day, Mysoline 125 mg in the morning, 250  mg at nighttime, Tegretol 250 mg morning, midday and 300 mg at nighttime,  Synthroid 75 mcg per day, potassium 20 mEq four  times a day.  The patient  also has a vagal neurostimulator and a Porta-cath.   The patient is discharged in improved condition.  We will try to increase  his Topamax.  His potassium at this time is 3.4 which in all likelihood is  related to the Topamax.  We need to attempt to bring seizures under better  control and have limited resources given the number of medicines that have  not controlled his seizures and those he has not been able to tolerate.                                               Deanna Artis. Sharene Skeans, M.D.    [facility]  D:  11/18/2002  T:  11/18/2002  Job:  [id number]

## 2011-02-07 NOTE — Discharge Summary (Signed)
[facility]  Patient:    [name] Visit Number: [id number] MRN: [id number]          Service Type: PED Location: PEDS [phone] Attending Physician:  Enos Fling Dictated by:   Marlan Palau, M.D. Admit Date:  10/09/2001 Discharge Date: 10/10/2001   CC:         [facility], [address]   Discharge Summary  ADMISSION DIAGNOSIS:  History of chronic seizure disorder with intractable seizures with recent recurrence.  DISCHARGE DIAGNOSES: 1. Intractable seizures with recent recurrence. 2. Mild mental retardation. 3. Hypothyroidism. 4. Growth hormone deficit.  PROCEDURES:  None.  COMPLICATIONS:  None.  HISTORY OF PRESENT ILLNESS:  [name] is a 25 year old white male born Dec 02, 1985, with a history of intractable seizures.  The patient has had a vagal nerve stimulator placed which has not fully controlled his seizure events.  The patient has come in once again with a flurry of seizures while at home.  He has been given Diastat and received some IV Ativan and phenobarbital through the emergency room with resolution of seizures.  The patient has been on Keppra, Trileptal, and Topamax, has had some trouble with hypokalemia, etiology not clear.  The patient was brought in for observation and management of seizures.  PAST MEDICAL HISTORY: 1. History of growth hormone deficiency. 2. Hypothyroidism. 3. Mild mental retardation. 4. Refractory partial complex seizures. 5. History of vagal nerve stimulator placement.  MEDICATIONS: 1. Keppra 1250 mg twice a day. 2. Trileptal 300 mg 3 times a day. 3. Klor-Con 10 mEq 3 times a day. 4. Topamax 125 mg 3 times a day. 5. Synthroid 0.075 mg daily.  ALLERGIES:  This patient has intolerance to DILANTIN and DEPAKOTE.  Please refer to History & Physical dictation for Social History, Family History, Review of Systems, and Physical  Examination.  LABORATORY VALUES:  Notable for sodium 136, potassium 3.1, chloride 109, CO2 22, glucose 111, BUN 15, creatinine 0.6, calcium 8.7.  White count 5.1, hemoglobin 11.3, hematocrit 31.7, MCV 77.6, platelets 231.  Calcium 8.7.  EKG reveals normal sinus rhythm with occasional premature supraventricular complexes, possible left atrial enlargement.  HOSPITAL COURSE:  The patient was admitted for evaluation of seizures.  The patient received IV phenobarbital 60 mg and Ativan 2 mg IV.  The patient was somewhat drowsy but did well following this without any recurrent seizures. The patient has done well over the last 24 hours and appears to be at baseline behavior.  The patient is to be discharged to home today with increase in the Trileptal dosing to take 300 mg twice a day, 450 mg in the evening.  This may be gradually increased over time.  The patient will remain on Keppra 1250 mg 1 twice a day, and Topamax 125 mg 3 times a day.  The patients Klor-Con will be increased to 20 mEq twice a day.  The patient was given added potassium supplementation, and recheck on the potassium level prior to discharge was 3.6.  Sodium level has dropped to 132.  This will need to be followed as an outpatient.  AT time of discharge, the patient was bright, alert, cooperative, moves all four extremities.  The patient has been eating well.  The patient will follow up with Dr. Sharene Skeans.  He may need an adjustment to his vagal nerve stimulator. Dictated by:   Marlan Palau, M.D. Attending Physician:  Enos Fling DD:  10/10/01  TD:  10/11/01 Job: [id number]

## 2011-02-07 NOTE — H&P (Signed)
[place]. [facility]  Patient:    [name], [name] Visit Number: [id number] MRN: [id number]          Service Type: MED Location: PEDS [phone] Attending Physician:  Mick Sell Dictated by:   Deanna Artis. Sharene Skeans, M.D. Admit Date:  12/07/2001 Discharge Date: 12/09/2001   CC:         Fonnie Mu, M.D.   History and Physical  DATE OF BIRTH:  17-Jan-1986  CHIEF COMPLAINT:  Recurrent seizures.  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  He has had a series of seizures that have required hospitalization because of their prolonged nature or recurrence over a short period of time.  The patient has been evaluated at [facility] and [facility] for a variety of inborn errors of metabolism, mitochondrial disease, and underlying structural abnormalities to his brain which are evident, but underlying etiology has not been discerned.  MRI shows calcification of the basal ganglia, cerebellum, and also diffuse atrophy.  The patient has panhypopituitarism.  He has a multifocal interictal EEG.  He has been on a variety of medications which have failed to control his seizures.  Today, around 1 a.m., he had a brief complex partial seizure with posturing of his right arm and staring into space.  This lasted for about a minute.  He had incontinence of urine.  Around 6:55 a.m. this morning, the patient had gotten up and he suddenly had a seizure that was generalized in nature.  He fell to the floor, striking the left occipital region of his head, and had generalized tonic-clonic seizure activity that lasted for about four minutes.  He did not lose control of his bowels or bladder.  In both cases there was a fair amount of salivation. After the second, he had a fair amount of coughing.   He was also crying, presumably because of pain from striking his head.  The patient was brought to the emergency room for evaluation and a decision was made to admit him for at least 24 hours of observation.  PAST MEDICAL HISTORY: 1. Congenital ventricular septal defect. 2. History of hypopituitarism that was thought to be panhypopituitarism but    the patient is able to grow on his own and he has not needed any    replacement therapy other than Synthroid. 3. Hypokalemia. 4. Dysmorphic features with growth failure.  PAST SURGICAL HISTORY:  The patient had implantation of a vagal nerve stimulator about a year ago.  Also, implantation of a catheter two or three years ago that has provided IV access in emergency situations.  CURRENT MEDICATIONS: 1. The patient was recently switched during a hospitalization from Trileptal    to Tegretol, currently takes Tegretol 200 mg - 100 mg tablets two p.o.    t.i.d. 2. Keppra 500 mg tablets two-and-a-half p.o. b.i.d. 3. Topamax 100 mg tablets one p.o. t.i.d., 25 mg tablets one p.o. t.i.d. 4. Synthroid 75 mcg q.d. 5. Potassium chloride was recently increased to 20 mEq q.i.d.  ALLERGIES TO MEDICINES:  DILANTIN (worsens his seizures), DEPAKOTE (caused significant metabolic acidosis and dysfunction of many organ systems including his liver).  REVIEW OF SYSTEMS:  Growth delay, developmental delay with mental retardation, VST with irregular heart beat, mitral valve prolapse, hypothyroidism, hypokalemia, irregular heart beat that is not consequential.  Patient has not had intercurrent infections in the head  and neck, lungs, GI, GU.  No fever, no rash, anemia, bruisability, diabetes, or broken bones.  No environmental allergies or autoimmune disorders that are known.  Since Topamax was discontinued the patient is more alert and his appetite has improved.  SOCIAL HISTORY:  The patient is in the sixth grade in a cross-categorical class at [employer]  Both parents work outside the home.  Father travels extensively.  Mother is a Engineer, site.  Other caretakers include his teenage sister, [name], and his paternal grandfather.  PHYSICAL EXAMINATION TODAY:  GENERAL:  The patient is a pleasant, sandy-haired, blue-eyed boy in no distress.  VITAL SIGNS:  Blood pressure 100/64, resting pulse 80, respirations 20, temperature 97.0.  HEENT:  No signs of infection.  Supple neck, full range of motion.  No cranial or cervical bruits.  The patient has a small laceration in the left occipital region.  I do not feel a very large ecchymosis; even though it was larger to being with it has basically spread out and is of little consequence. He has microcephaly.  He has a prominent beak-like nose.  RESPIRATORY:  Lungs clear to auscultation.  HEART:  Holosystolic murmur at the left sternal border.  Pulses normal but irregular.  ABDOMEN:  Soft, nontender, bowel sounds normal.  No hepatosplenomegaly.  EXTREMITIES:  Are well formed without edema, cyanosis, or altered tone.  NEUROLOGIC:  Mental status:  Patient was awake and alert.  He followed commands today better than most times when he is postictal.  He is able to name objects and follow commands and speak in brief sentences.  Cranial nerves:  Round reactive pupils.  Visual fields full.  Funduscopic examination was normal.  Symmetric facial strength, midline tongue and uvula. Visual acuity on November 22, 2001 was 20/50 -1 OD, 20/30 OS without corrective lenses.  Air conduction greater than bone conduction bilaterally.  Motor examination:  Patient showed near normal strength in his upper extremities without pronator drift.  Fine motor movements were a bit clumsy. Strength in his proximal legs was 4+/5; distally it was 5/5.  Sensation intact to noxious stimuli and also stereoagnosis.  Gait was not tested.  Deep tendon reflexes were absent.  He had bilateral flexor plantar  responses.  IMPRESSION:  1. Intractable complex partial seizures with secondary generalization, 345.41,    345.11. 2. Developmental delay with mental retardation, 783.41, 317. 3. Panhypopituitarism. 4. Ventricular septal defect. 5. Hypercholesterolemia. 6. Hypokalemia.  PLAN:  We will check a Tegretol level, basic metabolic panel, and CBC with diff.  Tomorrow we will check a morning Tegretol level to obtain a true trough and adjust his medicine accordingly.  I see no reason to get a CT scan at this time.  The patient is postictal and at times stares into space as if he is getting ready to start a seizure.  For that reason, it seemed to be wise to admit him to the hospital for prompt abortive care if it is necessary. Dictated by:   Deanna Artis. Sharene Skeans, M.D. Attending Physician:  Mick Sell DD:  12/13/01 TD:  12/13/01 Job: [id number]

## 2011-02-07 NOTE — H&P (Signed)
[place]. [facility]  Patient:    [name], [name]                  MRN: [id number] Adm. Date:  [id number] Attending:  Mick Sell                         History and Physical  CHIEF COMPLAINT: Recurrent seizure.  HISTORY OF PRESENT ILLNESS: [name] is a 25 year old right-handed Caucasian young man with VSD, pan-hypopituitarism, and intractable seizure since age 32, which began six months after he began human growth hormone.  The patient has been through major epileptic drugs that are available and continues to have one to three major seizures per month, one of which will cause an ER visit, one of which sometimes causes hospitalization.  The patient had relative cessation of seizures when growth hormone was stopped for a period of time and then he experienced a spontaneous growth spurt and the seizures came back.  They tend to be left simple partial sensory or motor seizures and evolve to complex partial with occasional secondary generalization.  We have tried intravenous immunoglobulin on six occasions at one month intervals without any change in his seizure frequency.  As a result of this a vagal nerve stimulator was placed about ten days ago, and he has had no seizures since that time.  (This is merely coincidence).  Work-up MRI showed some lesions but they failed to explain the patients seizures.  He does have some basal ganglia calcifications.  EEG showed right focal seizure activity.  The patient went to bed tonight and came stumbling out of room and down into the den where his parents were.  He had some clonic activity of his left side but there was more posturing and lack of control of his left leg.  There was some saliva coming from his mouth.  His parents took him to the hospital and I called them at their daughters request while they were en route.  [name] after about ten minutes began tocome around and talk to them.  By the  time he arrived at the hospital he was awake, though lethargic, but responsive.  We decided to put him in for observation in an attempt to be able to treat any other prolonged seizure expeditiously.  CURRENT MEDICATIONS:  1. Topamax 125 mg t.i.d.  2. Tegretol 150 mg in the morning, 200 mg mid day, 250 mg at night time.  3. Keppra 1000 mg b.i.d.  4. Synthroid 0.75 mg q.d.  5. Tylenol #3 2-4 cc q.4h as needed for pain at his vagal nerve stimulator     site.  6. Diastat was given tonight in an attempt to bring his seizure under     control.  ALLERGIES:  1. DILANTIN caused whole body rash.  2. DEPAKOTE caused the patient to become very sick and somnolent.  REVIEW OF SYSTEMS: Positive for upper respiratory infection this past week with rhinorrhea.  No fever, nausea, vomiting, headache, rash, easy bruisability.  Review Of Systems otherwise negative.  FAMILY HISTORY: Negative for seizures.  SOCIAL HISTORY: The patient attends [employer], in fifth grade.  He is in a self-contained classroom and has been classified as TMH down from [facility], but the patient continues to improve - particularly in the area of spelling.  PHYSICAL EXAMINATION:  GENERAL: This is a blonde, slightly dysarthric child, with a beak nose, in no acute distress.  VITAL SIGNS:  Pulse 96, respirations 20.  He is afebrile.  HEENT: No signs of infection.  NECK: Supple.  Full range of motion.  No cranial or cervical bruits.  LUNGS: Clear to auscultation.  HEART: Holosystolic murmur at left sternal border; pulses normal.  ABDOMEN: Soft, bowel sounds normal; no hepatosplenomegaly.  EXTREMITIES: Slightly tight heel cords.  NEUROLOGIC: Awake and alert, lethargic.  He follows commands.  Cranial nerves show round and reactive pupils.  Fundi normal.  Visual fields full to double simultaneous stimuli.  Symmetric facial strength.  Midline tongue and uvula. Air conduction greater than bone conduction  bilaterally.  Motor examination shows normal strength, good fine motor movements.  No pronator drift.  Sensation intact to cold, vibration, and stereognosis. Cerebellar examination shows good finger-to-nose.  No tremor, dystaxia, dysmetria.  Gait not tested.  He was immediately postictal.  Deep tendon reflexes absent, bilateral flexor and plantar responses.  IMPRESSION:  1. Complex partial seizures, right brain syndrome, 345.40.  2. Mental retardation, moderate; 318.  3. Ventriculoseptal defect.  4. Pan-hypopituitarism.  5. Status post implant vagus nerve stimulation.  PLAN:  1. Observe tonight.  2. Ativan for recurrent seizures lasting greater than five minutes.  3. Will discharge the patient in the morning if he is okay. DD:  02/06/01 TD:  02/07/01 Job: [id number]

## 2011-02-07 NOTE — H&P (Signed)
NAME:  [name], [name] NO.:  [id number]   MEDICAL RECORD NO.:  [id number]          PATIENT TYPE:  INP   LOCATION:  1832                         FACILITY:  [facility]   PHYSICIAN:  Deanna Artis. Hickling, M.D.DATE OF BIRTH:  1986/02/02   DATE OF ADMISSION:  03/25/2006  DATE OF DISCHARGE:                                HISTORY & PHYSICAL   CHIEF COMPLAINT:  Recurrent seizures, possible neck injury, febrile illness.   HISTORY OF PRESENT ILLNESS:  25 year old right-handed Caucasian young man  with a history of mixed seizures since age 31.  These have been simple  partial, somatosensory, complex partial, and secondarily generalized, the  later predominates at this time.  This morning, [name] experienced a 1 to 2  minute generalized tonic-clonic seizure and fell backwards striking his  head.  He was postictal and given Ativan 2 mg.  He slept until 4:00 p.m.  He  got up on his own and experienced a seizure in the bathroom during which  time he struck his head.  We do not know whether he struck his head or neck.  The area at the back of the neck did not fill right to his sister.  He was  transported to [facility] where I examined him and found that he  was very close to his neurologic behavioral baseline.  The patient was in a  neck collar and on a trauma board.  We attempted to get clearing films of  his neck which were acceptable.  During an attempt to do full C-spine  series, he experienced another generalized tonic-clonic seizure lasting for  1 1/2 minutes associated with apnea and cyanosis.  I arrived as it was  ending.  He had oxygen and was pinking up.  He was struggling and in the  postictal period was coughing, gagging, crying, vomiting some partially  digested food.  He had bitten his tongue and experienced urinary  incontinence.  Prior to the x-ray films, he had an oral temperature of 99.3,  after he came back rectal temperature was 102.7.  His skin had felt  quite  warm during exam.   REVIEW OF SYSTEMS:  Negative for prior illness.  See past medical history  for the remainder of review of systems.   PAST MEDICAL HISTORY:  Positive for growth delay, hypothyroidism, Brown's  syndrome (eye movement disorder) mitral valve prolapse, muscular VSD,  hypokalemia, dental malocclusion, microcephaly, mental retardation, history  of catheter sepsis, and diplegia.   PAST SURGICAL HISTORY:  Eye muscle surgery x2, implantation of [name],  vagal nerve stimulator implantation and reimplantation.   MEDICATIONS:  Tegretol 250 mg at 7:00 a.m., 1:30 p.m., and 300 mg at 9:00  p.m., Topamax 150 mg at 7:00 a.m., 1:30 p.m., 9:00 p.m., Keppra 1250 mg 7:00  a.m. and 9:00 p.m., Synthroid 75 mcg 7:00 a.m., primidone 125 mg at 7:00  a.m., 250 mg at 9:00 p.m., Clarinex 5 mg 9:00 p.m., Klor-Con 20 mEq at 7:00  a.m., 1:30 p.m., 5:00 p.m., and 9 p.m., K-Phos 500 mg at 7:00 a.m., 1:30  p.m., 5:00 p.m. and 9:00 p.m.  The patient also has a vagal nerve stimulator  that is on rapid sequence high voltage.   ALLERGIES:  Intolerance to DILANTIN, actually increased seizure frequency,  DEPAKOTE caused significant organ dysfunction, both hepatic and renal, the  older antihistamines have caused increasing frequency of seizures.   FAMILY HISTORY:  Positive for migraines in both parents and older sister,  diabetes mellitus in all four grandparents, hypertension in his father, no  history of mental retardation, cerebral palsy, blindness, deafness, birth  defects.   SOCIAL HISTORY:  The patient attends high school in a cross categorical  class.  He lives at home with his parents and sister. His sister has a great  deal of responsibility for [name]' care while both parents work.  She is  going to school.   PHYSICAL EXAMINATION:  VITAL SIGNS:  Temperature 102.7, blood pressure 118/42, resting pulse 180,  respirations 20, oxygen saturation 100% on room air.  HEENT:  He has wax  in his right year, left is okay.  Neck is supple.  No  nasal discharge.  Pharynx unremarkable.  LUNGS:  Clear.  HEART: Holosystolic murmur.  Pulses are racing.  ABDOMEN:  Soft.  Bowel sounds normal.  No hepatosplenomegaly.  He has tight  heel cords.  NEUROLOGIC: Mental status reveals the patient has mental retardation, but no  dysphasia.  Cranial nerves reveal round reactive pupils, visual fields full,  extraocular movements full, symmetric facial strength, midline tongue and  uvula.  Motor examination reveals he moves all four extremities well.  He is  4+/5 strength in his arms and legs. Fine motor movements were clumsy.  Sensory examination shows withdrawal x4, deep tendon reflexes were absent.  He had bilateral flexor plantar responses.   IMPRESSION:  1.  Recurrent secondarily generalized tonic-clonic seizures intractable,      345.11.  2.  Evaluate for neck injury, this is not complete.  3.  Fever unknown origin.  We need to be certain he does not have catheter      sepsis.  We need to also look for urinary tract infection.      Streptococcal pharyngitis, and make certain he has not had aspiration      pneumonia.   PLAN:  We will obtain blood cultures from his peripheral site and also his  [name]. Urine culture, urinalysis chest x-ray, CBC with diff,  comprehensive metabolic panel. In the morning we will obtain trough  antiepileptic drug levels including topiramate, carbamazepine, primidone,  and phenobarbital.  Keppra levels are not obtainable nor are they useful.  We will make no changes in his medications.  We will keep him in his hard  collar until CT of the cervical spine is read and clears his neck.  I saw no  focal deficits and the clearing films looked okay so I doubt that there has  been fracture but I cannot rule out subluxation and we need to be careful.  I am going to withhold antibiotics for right now pending his laboratories. I expect to see a left shift, so he  may go on broad spectrum antibiotics  until cultures are clear.  I appreciate the opportunity to participate in  his care.      Deanna Artis. Sharene Skeans, M.D.  Electronically Signed     [facility]/MEDQ  D:  03/25/2006  T:  03/25/2006  Job:  [id number]

## 2011-02-07 NOTE — Consult Note (Signed)
NAME:  [name], [name]                     ACCOUNT NO.:  [id number]   MEDICAL RECORD NO.:  [id number]                   PATIENT TYPE:  OBV   LOCATION:  6122                                 FACILITY:  [facility]   PHYSICIAN:  Melvyn Novas, M.D.               DATE OF BIRTH:  1986-09-19   DATE OF CONSULTATION:  09/02/2002  DATE OF DISCHARGE:  07/23/2002                                   CONSULTATION   This patient was admitted after a series of five seizures in a left than  five-hour period followed by prolonged posticum.  The episodes involved a  generalized tonic clonic activity and were initially treated by the parents  with rectal Valium, but did not stop to recur.  Since the patient did not  regain his full level of consciousness in between, we were called by his  sister and prepared for his admission through the ER. The pediatric teaching  service was consulted.  The patient received in the ER 2 mg of Ativan and 65  mg of phenobarbital.  No further seizures were observed in the parents' car  nor in the emergency room.  The patient had no evidence of any infection  preceding these events.  He had eaten fine, felt normal, and acted within  his normal realm.   PHYSICAL EXAMINATION:  GENERAL:  The patient is still drowsy on the day of  admission.  VITAL SIGNS:  Temperature 98.0, blood pressure 100 and 105 systolic over 60  to 70. Resting pulses were 70s to 80s.  Respirations in the 20s low range.  Pulse oximetry was 100% on room air.  He showed no focal deficits except for  his protracted drowsiness after treatment and multiple seizures. Recovered  without any new deficits.  LUNGS:  Clear to auscultation.  HEART:  Systolic murmur.  ABDOMEN:  Active bowel sounds, no peripheral edema.  No depigmentation or  hyperpigmentation.  HEENT:  Mucous membranes appear not inflamed or infected.  The patient has  an abnormal shaped skull with a slightly fleeing chin. Small upper airway  that  shows no lateralization of facial strength, pupillary tone, or tongue  and uvula movement.   LABORATORY DATA:  Sodium 134, potassium between 3 and 3.4, slightly  hypokalemic. Liver enzymes were within normal range as was bilirubin,  calcium, and total protein. No white count elevation or anemias noted.   HOSPITAL COURSE:  The patient received potassium supplementation.  His  hypokalemia is thought to be a side effect to Topiramate.  I have no  explanation for today's bout of seizures as the patient seems to be afebrile  and have no evidence of viral syndrome. He was last admitted only 14 days  ago and he has gained a little bit of weight which might allow [place] to  increase his Mysoline levels.   MEDICATIONS:  1. Mysoline 200 mg in the morning, 250 mg at night.  2.  Tegretol 100 mg 2-1/2 in the morning and 2-1/2 at 1 p.m., 300 mg at     bedtime.  3. Topiramate 125 mg t.i.d.  4. Keppra 1250 mg at 8 a.m. and 8 p.m.  5. Synthroid is replaced for hypothyroidism at 0.05 mg.  6. Potassium chloride 20 mEq up to four times daily.   The patient is to follow up with Dr. Sharene Skeans within the month. His mother  will call Dr. Sharene Skeans on Monday to discuss the discontinuation or weaning  of topiramate as she is concerned about the protracted hypokalemia and the  replacement of potassium up to four times a day.  She recently had a vocal  cord surgery and communication is complicated by this. She will otherwise  try to contact us by email.  Due to her own recovery after surgery she will  be available at home to watch him during the day for the next couple of  weeks and feels that this is an appropriate time to wean him off of an  antiepileptic drug under close supervision.   FINAL DIAGNOSES:  1. Complex partial seizure with secondary generalizations, status     epilepticus. 345.41 and 345.11.  2. Mental retardation.  317.0  3. Hypothyroidism.  4. Hypokalemia.  5. Ventriculoseptal defect.  6.  Question of pulmonary valve stenosis.  7. History of mitral valve prolapse.  8. Growth delay with supplementation of growth hormones until last year.   PROCEDURE:  None. The patient is status post implantation of a vagal nerve  stimulator and is status post implantation of a [name].                                               Melvyn Novas, M.D.    CD/MEDQ  D:  09/02/2002  T:  09/02/2002  Job:  [id number]

## 2011-02-07 NOTE — Consult Note (Signed)
[place]. [facility]  Patient:    [name]                       MRN: [id number] Proc. Date: 09/22/99 Adm. Date:  [id number] Attending:  Cathleen Corti Iv                          Consultation Report  HISTORY:   This 25 year old right-handed white male has a known prior history of panhyperpituitarism, mitral valve disorder, hypothyroidism, microcephalus, and uncontrolled partial seizures with secondary generalization.  Currently, he is n Kapra 500 mg in the morning and 1,000 mg q. h.s., Tegretol 150 mg, 250 mg on a t.i.d. schedule, Topamax 75 mg and a 100 mg on a t.i.d. schedule, and Synthroid  0.75 mg q. d.   Wilburn Mylar, he missed one dose, but subsequently, did get it after he had a seizure lasting less than two minutes.   He will frequently have seizures that occur for less than two minutes, but in November, had a prolonged seizure lasting as long as 45 minutes.   Today, at about 4:45 pm. he had a prolonged seizure lasting 25 minutes even after 10 mg of Dyastat per rectum.  He came to he emergency room where had a temperature of 99.6 degrees, a heart rate of 116, blood pressure 105/50 in the right arm and neck supple.  He was alert, smiled, looked at the examiner.  Visual fields were full.  There was no 7th nerve palsy.  Hearing was present.  He had bilateral ptosis.  Disks were seen and flat.  The sternocleidomastoid trapezius testing normal.  Motor examination with diffuse weakness in the upper and lower extremities with diffuse atrophy.  He had microcephalus.   He had deep tendon reflexes which were decreased and plantar responses were downgoing. He felt pinprick in all extremities.  His mother reports that recently he had runny nose symptoms.  LABORATORY DATA:  Hemoglobin 11.8, hematocrit 33.3, WBC 5,700, platelet count 255,000.  IMPRESSION: 1. Retractable secondary generalized major motor seizures, code 345.63. 2. Microcephalus,  code 742.1. 3. Hypothyroidism, code 244.9. 4. Suspect viral illness.  PLAN:  The plan at this time is to check comprehensive metabolic panel and observe in the emergency room, give a prescription for amoxicillin 250 glioblastoma multiforme b.i.d. if his viral type symptomatology with runny nose continues.  DD:  09/22/99 TD:  09/22/99 Job: [id number]

## 2011-02-07 NOTE — H&P (Signed)
[place]. [facility]  Patient:    [name], [name]                  MRN: [id number] Adm. Date:  [id number] Disc. Date: [id number] Attending:  Lorre Nick CC:         Fonnie Mu, M.D.  Sabino Snipes, M.D., [facility]   History and Physical  DATE OF BIRTH:  August 02, 1986  CHIEF COMPLAINT:  Intractable seizures.  HISTORY OF PRESENT ILLNESS:  [name] is a 25 year old boy with intractable, complex partial seizures that began August 28, 1993.  The patient has had extensive workup, which is documented in detail in the August 22 and September 22 admission history and physical examinations, and I will not repeat them here.  In brief summary, however, the patient shows evidence of subcortical and periventricular white matter hyperintensities with increased calcification in basal ganglia region.  EEGs have shown a variety of findings, but most recently show right mid temporal greater than left sharp waves and a fairly well organized EEG.  Patient has had an extensive metabolic workup, which has failed to show any definite abnormalities.  The only abnormalities that have been shown, however, were lumbar puncture that on one occasion showed elevated white blood cells. This happened after a series of prolonged seizures and may have been artifactual; however, CSF protein has remained elevated.  Working hypothesis is that this represents some form of mitochondrial chromosomal disorder, but this has not been able to be proved.  The other concern was some form of chronic inflammatory disease of the nervous system, which was the reason for treatment of this child with IVIG.  He has been on a variety of antiepileptic medications, that include Tegretol, Neurontin, Lamictal, Felbatol, Topamax, Dilantin, and Depakote, though Dilantin and Depakote exacerbated his seizures, and Depakote actually made him physically ill.  This is the  reason that we believe that there might be some sort of energy-dependent or mitochondrial etiology for his dysfunction.  HISTORY OF PRESENT ILLNESS:  The patient has had five isolated seizures at nighttime, lasting one-and-a-half to three minutes in the past month.  He also had a group of cluster seizures on October 7, which began at 8:15 a.m. with a three-and-a-half minute seizure where he fell on the floor.  He had 12 to 15 seizures, which were ultimately stopped, as they have been in the past, with Ativan 2 mg IV.  He stayed in the emergency room six hours.  He was able to be treated and released.  REVIEW OF SYSTEMS:  Patient has not had any intercurrent infections.  Indeed, his general level of health has been better since starting IVIG infusions.  He is generally more alert.  Though he is somewhat sleepy when he first arrives at school, he seems to be working more effectively and to be learning more efficiently than in the past.  CURRENT MEDICATIONS: 1. Tegretol 150 mg in the morning, 200 mg at midday, 250 at nighttime. 2. Topamax 75 mg t.i.d. 3. Keppra 1000 mg b.i.d. 4. Synthroid 0.075 mg per day.  ALLERGIES:  None, but note the intolerances to DEPAKOTE and DILANTIN.  FAMILY HISTORY:  See prior dictation.  SOCIAL HISTORY:  Patient is in the fifth grade.  He is in a self-contained classroom at [employer].  He is working on about a first grade level for reading, mathematics, and spelling.  He was in [employer] and will being bowling  this winter.  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  PHYSICAL EXAMINATION TODAY:  VITAL SIGNS:  Temperature 96.1, blood pressure 108/56, resting pulse 72, respirations 20.  HEENT:  No signs of infection.  LUNGS:  Clear.  HEART:  The patient has a holosystolic murmur at the left sternal border.  PULSES:  Normal.  ABDOMEN:  Soft, nontender, bowel sounds normal.  EXTREMITIES:  Well-formed, without edema,  cyanosis, alterations in tone, or tight heel cords.  NEUROLOGIC:  Awake, alert, no dysphagia, mild dysarthria.  Cranial nerves reveal round, reactive pupils.  Visual fields full.  Extraocular movements full.  The patient has symmetric facial strength that was normal.  Midline tongue and uvula.  Air conduction greater than bone conduction bilaterally.  Motor examination reveals no drift.  Normal strength, tone, and mass. Sensation intact to primary and cortical modalities.  Gait was fairly normal. He is a little clumsy, does not tandem very well.  Deep tendon reflexes are absent.  He had bilateral flexor plantar responses.  IMPRESSION: 1. 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 2. Mental retardation - 317.  Etiology of these is unknown. 3. Pan hypopituitarism. 4. Ventricular septal defect.  PLAN:  IVIG infusion 15 g x 3 at 12 hour intervals via pharmacy protocol.  The patient will be pretreated with ibuprofen 200 mg prior to the episodes.  He has had no side effects from this infusion.  In general, we feel that the frequency and severity of his seizures has declined, although the number of cluster seizures per month has not.  At the conclusion of his IVIG infusion, [name] will be discharged home, and we will follow him up in one months time for another infusion of IVIG and be in touch with the family by phone in the interim.  Should [name] begin a cluster of seizures here in the hospital, he will be treated with Ativan 2 mg IV. Otherwise, we will continue him on his regular medications without change. DD:  07/18/00 TD:  07/18/00 Job: [id number]

## 2011-02-07 NOTE — Discharge Summary (Signed)
[place]. [facility]  Patient:    [name], [name] Visit Number: [id number] MRN: [id number]          Service Type: PED Location: PEDS [phone] Attending Physician:  Enos Fling Dictated by:   Deanna Artis. Sharene Skeans, M.D. Admit Date:  01/04/2002 Disc. Date: 01/05/02                             Discharge Summary  FINAL DIAGNOSES: 1. Status epilepticus, 435.3. 2. Intractable complex partial seizures with secondary generalization, 345.41,    345.11. 3. Mental retardation, 318. 4. Hypopituitarism. 5. Hypokalemia.  HISTORY OF THE PRESENT CONDITION:  The patient is well known to this service. He is a 25 year old young man with a history of developmental delay, hyperpituitarism, mild mental retardation, and intractable seizures.  The patient was admitted one week ago.  Prior to that he had 17 days between admissions.  In the day of admission the patient had a seizure around midnight, which did not respond to stimulating his vagal nerve stimulator.  He was given 2 mg of diazepam and went to sleep.  The patient had another brief seizure noted at school.  The patients father went to pick him up somewhere around 11:20, and no sooner had he gotten home to get the patients medicine (he sensed that the patient was beginning to go into a prolonged seizure).  He came out to find the patient slumped in the seat.  He had mild periorbital cyanosis.  He was very pale, quite limp, and had intermittent tonic movements with total unresponsiveness.  He was given 10 mg of Diastat rectally and did not respond to that.  I believe the vagal nerve stimulator was also stimulated without response.  The patient had some gnashing of his teeth and drooling in association with the other movements.  His father transported him to the [facility] Emergency Room.  He was about 35 minutes into his seizure when it stopped.  At that time staff was preparing to give him 60 mg of  phenobarbital and 2 mg of Ativan, which was given.  The patient was sleepy from medications and postictal for much of the rest of the day, but his father was able to get his oral medications into him at noon time and in the evening, and the patient has had no further seizures.  CURRENT MEDICATIONS:  Tegretol 200 mg p.o. t.i.d., Topamax 125 mg t.i.d., Keppra 1250 mg b.i.d., potassium chloride 20 mEq t.i.d., Synthroid 0.075 mg q.d.  ALLERGIES TO MEDICINES:  DILANTIN AND DEPAKOTE.  REVIEW OF SYSTEMS:  The patient has been well without fever, intercurrent infection in head, neck, lungs, GI, or GU.  He has good appetite (much better than on Trileptal).  Normal sleeping habits.  He seems more awake and alert at this time, and he has had no side effects of toxicity of Tegretol despite levels that have been in the 10-11 mcg/ml range at trough.  He has a VSD murmur.  He has short stature and has been on growth hormone replacement.  The remainder of systems reviewed is negative.  FAMILY HISTORY:  Negative for seizures, mental retardation, growth hormone or other hormone deficiencies.  SOCIAL HISTORY:  His parents have been extremely attentive to his care as has his sister.  They are very knowledgeable and realistic about his very difficult and poorly controlled seizure situation.  The patient attends public school.  He is  making slow progress but actually has done somewhat better since coming off of Trileptal and going on Tegretol.  PHYSICAL EXAMINATION:  GENERAL:  On examination this morning the patient is sleepy but awake and responsive.  VITAL SIGNS:  Temperature is 97 degrees, blood pressure 104/58, resting pulse 95, respirations 15, pulse oximetry 99%.  HEENT:  No signs of infection.  NECK:  Supple.  Full range of motion.  No cranial or cervical bruits.  LUNGS:  Clear to auscultation.  HEART:  Shows a holosystolic murmur at the left sternal border.  It is mild. Pulses are  normal.  ABDOMEN:  Soft, nontender.  Bowel sounds are normal.  EXTREMITIES:  Well formed.  Heel cords are slightly tight but no other spasticity is present, and no other skeletal abnormalities are seen.  NEUROLOGIC:  On mental status examination, the patient is awake and drowsy, but he follows commands readily.  He responds to me and speaks in brief sentences.  Cranial nerves are round, reactive pupils, visual fields full, symmetric fascial strength, midline tongue.  On motor examination, the patient has adequate strength in his arms and his legs.  Good fine motor movements with wiggling his fingers.  Sensation appears to be intact to cold and stereoagnosis.  Cerebellar examination shows no tremor, dystaxia.  Gait is not tested.  Deep tendon reflexes are absent.  The patient has bilateral flexor plantar responses.  DISPOSITION:  The patient will be discharged later today as long as he is up, getting around, eating, and showing no further seizures.  We may very well increase his Tegretol.  Even though he is at a high level, the fact that he is not showing toxicity suggests that we may go further.  LABORATORY STUDIES:  Sodium 142, potassium 3.3, chloride 113, CO2 of 21, glucose 109, BUN 10, creatinine 0.6, calcium 9.0, total protein 6.7, albumin 3.5, SGOT 21, SGPT 19, alkaline phosphatase 212, total bilirubin 0.5. Carbamazepine 10 late morning trough.  The patient showed regular sinus rhythm with a normal QRS pattern.  DISCHARGE MEDICATIONS:  The patient will be discharged on the same medications at this time.  We will adjust his medications as an outpatient.  NOTE:  I can be reached at [facility] at [phone], extension [phone] during this day. Dictated by:   Deanna Artis. Sharene Skeans, M.D. Attending Physician:  Enos Fling DD:  01/05/02 TD:  01/05/02 Job: [phone]

## 2011-02-07 NOTE — H&P (Signed)
[place]. [facility]  Patient:    [name], [name] Visit Number: [id number] MRN: [id number]          Service Type: PED Location: PEDS [phone] Attending Physician:  Melvyn Novas Dictated by:   Melvyn Novas, M.D. Admit Date:  01/14/2002 Discharge Date: 01/15/2002                           History and Physical  OBSERVATION STATUS  DATE OF BIRTH:  Jun 07, 1986  HISTORY OF PRESENT ILLNESS:  The patient is a 25 year old well-known patient that is readmitted to my service tonight for prolonged tonic-clonic seizure events overnight at home.  His parents observed a seizure that was over a 25-minute duration and therefore called the EMS squad after rectal diazepam and oral medications did not prevent onset or shorten the length of this event.  [name] presents tonight in the early morning hours to the ER for actually motor seizures.  In the past, he has been also admitted for sensory seizures.  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  During the course of his illness, he has required a vagal nerve stimulation implantation and a [name] implantation, as peripheral IV access has been difficult to gain.  [name] is small for his age and weighs only approximately 45 kg.  At the time of the last evaluation, he had more and more developed distinctive subtypes of seizures such as the described motor seizures versus partial tonic-clonic seizures with secondary generalization versus "sensory seizures."  Today, he received 60 g of phenobarbital and 2 mg of Ativan IV and became immediately lethargic and postictal.  His vital signs were stabilized in the ER.  So far, he has not shown abnormalities in his breathing.  He is still slightly tachycardic at 104 but his baseline temperature is normal and his blood pressures are well-maintained.  He  will be admitted to the pediatric floor 6700 within the hour.  FAMILY HISTORY:  The patient lives with his parents and has an older healthy sister.  He is in the fifth grade of a regular elementary school.  PAST MEDICAL HISTORY: 1. Chronic seizure disorder with frequent recurrent intractable partial    epilepsy events and also with frequent secondary generalization. 2. History of physical and mental developmental delays. 3. Mild mental retardation. 4. History of strabismus, surgery in the past. 5. Perma-Cath implantation. 6. VAS implantation.  REVIEW OF SYSTEMS:  [name] is now too drowsy-to-even-sleep.  He is not responsive to anything but noxious stimuli.  His father expressed to me that the patient had no headaches, breathing problems or chest pain, did not fall and injure himself and had also none of the sensory sensations that he had complained about in the past, but woke his parents up tonight asking to be brought to the hospital for his "twitches."  After having received rectal Valium, he went to sleep and had stated that he was very tired.  PHYSICAL EXAMINATION:  VITAL SIGNS:  Blood pressure is 105/58.  Temperature is 98.  Heart rate was 104 in the ER initially and has been varying between 110 and 95.  Respiratory rate is 14.  GENERAL:  The patient is somnolent, very small for his age, a white male with general limb flaccidity and very difficult to arouse due to sedation.  HEENT:  Atraumatic.  Pupils react  equal to light and accommodation.  The patient is able to produce a conjugate gaze.  Tongue and uvula are midline. The patient is too sleepy to cooperate with any further neurologic exam.  NECK:  Supple.  CHEST:  Clear to auscultation.  HEART:  Regular heart rhythm.  NEUROLOGIC:  The patient is unable to answer to questions.  He is very obtunded.  No facial weakness.  The patient leaves his head turned towards the right on his pillow and after being passively  moved, resumed the previous position.  Extremity motor strength has to be deferred.  Upper and lower extremities show equal low tone, deep tendon reflexes 1+.  No Babinski.  No clonus.  No response to sensory except for noxious stimuli.  Coordination deferred.  PLAN:  The patient will be admitted for an observation stay in the pediatric floor.  He has been in a postictal state with stable vital signs.  Drug levels for Tegretol are pending.  Telemetry observation including neuro-checks q.4h. is scheduled.  The patients parents are present and will be staying with him. P.r.n. medication for breakthrough seizures is ordered.  CURRENT MEDICATIONS: 1. Tegretol 200 mg three times and 300 mg at night. 2. Keppra 1.25 g t.i.d. 3. Potassium chloride 20 mEq t.i.d. 4. Topiramate 125 mg t.i.d. 5. Diastat was given p.r.n. rectally; the patient received 20 mg at home. 6. Tylenol p.r.n. for fever. 7. Synthroid 0.37 mcg p.o. q.d. Dictated by:   Melvyn Novas, M.D. Attending Physician:  Melvyn Novas DD:  02/09/02 TD:  02/10/02 Job: [id number]

## 2011-02-07 NOTE — Discharge Summary (Signed)
NAME:  [name], [name] NO.:  [id number]   MEDICAL RECORD NO.:  [id number]                   PATIENT TYPE:   LOCATION:                                       FACILITY:   PHYSICIAN:  Deanna Artis. Sharene Skeans, M.D.           DATE OF BIRTH:  [birth year]   DATE OF ADMISSION:  07/22/2002  DATE OF DISCHARGE:                                 DISCHARGE SUMMARY   FINAL DIAGNOSES:  1. Intractable complex partial seizures with secondary generalization with     breakthrough seizures, 345.41 and 345.11.  2. 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  3. Mental retardation, mild, 317.  4. History of diarrhea, likely gastroenteritis, resolving.  5. Hypokalemia.  6. Hypothyroidism.  7. Ventriculoseptal defect.  8. Growth delay.   PROCEDURES:  None.   COMPLICATIONS:  None.   SUMMARY OF THE HOSPITALIZATION:  The patient was admitted after a series of  4 seizures in a 4-hour period, followed by a prolonged 20- to 30-minute  seizure.  The episodes involved generalized tonic-clonic activities and were  treated with three doses of 2 mg of Ativan.  This failed to quell the  seizures and the patient was brought to the emergency room, where he  received Ativan 2 mg and phenobarbital 60 mg with cessation of his seizures.   The patient had rhinorrhea and diarrhea earlier in the week.  During the  hospital stay, he had an episode of diarrhea.   The patient has had one more seizure in the hospital; this was a brief  generalized tonic-colic seizure this morning that was stopped with a vagal  nerve stimulation/magnet stimulation.   The patient has taken a regular diet.  He has had no more diarrhea, no  vomiting and no complaints of abdominal pain.  He is near his neurologic  baseline.  He and his parent (father) desire to go home.   The episodes involved were basically left focal seizures with secondary  generalization.  The episode that required hospitalization was a 30-minute  episode of status epilepticus that was basically generalized in nature.   On examination today, temperature 97.9, blood pressure 99/63, resting pulse  88, respirations 24, pulse oximetry 100% on room air.  LUNGS:  Clear.  HEART:  Holosystolic murmur with pulses normal.  ABDOMEN:  Active bowel  sounds.  Nontender.  EXTREMITIES:  Extremities showed tight heel cords,  otherwise, mild decrease in mass, normal tone and no edema or cyanosis.  SKIN:  Skin shows excoriation, left foot more so than the right.  I do not  see a rash.  NEUROLOGICAL:  He is mildly lethargic.  He will name objects,  follow commands and is near his baseline.  Cranial nerves:  Round reactive  pupils.  Visual fields full.  Symmetrical facial strength.  Midline tongue.  Motor examination:  Normal strength and tone, decreased mass, tight heel  cords.  Sensory examination:  No deficit.  Deep tendon reflexes were absent.  He has a diplegic gait.   IMPRESSION:  DICTATION ENDED AT THIS POINT.                                                Deanna Artis. Sharene Skeans, M.D.    [facility]  D:  07/23/2002  T:  07/25/2002  Job:  [id number]

## 2011-02-07 NOTE — Discharge Summary (Signed)
[name], [name] 111                     ACCOUNT NO.:  [id number]   MEDICAL RECORD NO.:  [id number]                   PATIENT TYPE:  INP   LOCATION:  6118                                 FACILITY:  [facility]   PHYSICIAN:  Deanna Artis. Sharene Skeans, M.D.           DATE OF BIRTH:  08/29/1986   DATE OF ADMISSION:  02/20/2003  DATE OF DISCHARGE:  02/21/2003                                 DISCHARGE SUMMARY   NOTATION:  (Patient under observation status).   HISTORY OF PRESENT ILLNESS:  The patient is a 25 year old young man with  intractable, simple and complex partial seizures with secondary  generalization.  The patient had a single seizure in the early morning hours  of 02/20/03 which lasted for approximately three minutes.  He did well  shortly until he walked into the living room and his parents heard a loud  crash.  They ran into the room and he had had a seizure.  He was apneic and  had ecchymosis on his forehead.  He had three additional seizures on the way  to the emergency department and at least two other seizures before he was  given Ativan 2 mg and phenobarbital 65 mg.   HOSPITAL COURSE:  The patient has done relatively well.  I do not recall the  last time he was admitted but his parents decided not to try to treat him at  home because the last time that he fell and had a head injury, his seizures  were rather refractory.   DISCHARGE MEDICATIONS:  1. Tegretol 100 mg, two and a half in the morning, two and a half at 1300,     three tablets at bedtime.  2. Topamax 100 mg, one in the morning, one at midday, and one and a half at     nighttime.  3. Topamax 25 mg, two tablets in the morning and one tablet midday.  4. Keppra 500 mg, two and a half tablets twice daily.  5. Mysoline 250 mg, one half tablet in the morning and one at nighttime.  6. Synthroid 75 mcg, one tablet at nighttime.  7. K-Dur 20 mEq q.i.d.   PROCEDURES:  CT scan of the brain showed some calcifications of  the basal  ganglia and mild atrophy, no other abnormalities.   LABORATORY DATA:  I-stat:  pH 7.375, pO2 162, pCO2 30.3, bicarbonate 18.  Sodium 132, potassium 3.7, chloride 107, CO2 19, BUN 12, creatinine 0.6,  glucose 97.  AST 22, ALT 19, alkaline phosphatase 148, total bilirubin 0.5.  Calcium 8.5.  Carbamazepine 12.9.  CBC is unfortunately pending at this  time.   PHYSICAL EXAMINATION 02/21/03:  VITAL SIGNS:  Blood pressure 121/76, resting  pulse 92, respirations 20, temperature 97.9, pulse oximetry 99% on room air.  GENERAL:  The patient is awake and alert and tells me he wants to go to  school.  He is not showing significant postictal confusion.  SKIN:  The patient has a bruise over his forehead and a slight bruise over  his eye.  ENT:  No infections, no bleeding, no hemotympanum, no nasal drainage.  LUNGS:  Clear.  HEART:  The heart shows a holosystolic murmur at the left sternal border.  ABDOMEN:  Soft, nontender, scaphoid.  EXTREMITIES:  Thin, heel cords are tight.  NEUROLOGIC:  The patient is awake and alert, recognizes me readily, follows  commands, and can speak in phrases.  Cranial nerves:  Reactive pupils,  visual fields full to counting fingers, symmetric facial strength, midline  tongue.  Motor examination:  Normal grips, good fine motor movements, mildly  weak general strength due to his __________ , but this is at baseline.  Sensation with mild peripheral neuropathy, good stereognosis.  Gait was not  tested today.  Reflexes are absent.   IMPRESSION:  1. Status epilepticus, 345.23.  2. Recurrent breakthrough generalized seizures, 345.11.  3. 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   PLAN:  The patient will be discharged on the same medications.  We are near  the maximum amount of treatment for all medicines.  Topamax may be increased  further, but the patient's main goal is to go to school today and tomorrow  for the last two days of school.  We will facilitate that and  he is ready to  be discharged and to return to school.                                               Deanna Artis. Sharene Skeans, M.D.    [facility]  D:  02/21/2003  T:  02/21/2003  Job:  [id number]

## 2011-02-07 NOTE — Discharge Summary (Signed)
NAME:  BALEY, LORIMER                     ACCOUNT NO.:  [id number]   MEDICAL RECORD NO.:  [id number]                   PATIENT TYPE:  INP   LOCATION:  6704                                 FACILITY:  [facility]   PHYSICIAN:  Deanna Artis. Sharene Skeans, M.D.           DATE OF BIRTH:  07-19-1986   DATE OF ADMISSION:  05/09/2002  DATE OF DISCHARGE:  05/10/2002                                 DISCHARGE SUMMARY   DISCHARGE DIAGNOSES:  1. Status epilepticus - Code #345.3.  2. 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  3. Mental retardation - Code #317.  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   PROCEDURE:  None.   COMPLICATIONS:  None.   OBSERVATION HOSPITAL COURSE:  The patient was admitted to the hospital after  a 40-minute seizure which had followed a series of six seizures over about  an 18-hour period.  He was postictal after being treated with 2 mg of Ativan  and 60 mg of phenobarbital.  He otherwise had a nonfocal examination.  He  had no further seizures during the hospitalization.   His laboratory data is as follows:  Carbamazepine 11.8, calcium 8.7, total  protein 6.5, albumin 3.7, AST 25, ALT 19, alkaline phosphatase 220, total  bilirubin 0.4.  Sodium 134, potassium 3.2, chloride 110, CO2 of 19, BUN 10,  creatinine 0.7, glucose 136.  The potassium was redone this morning and it  was 3.7.  CBC:  White count 4000, hemoglobin 12, hematocrit 35.3, platelet  count 355,000, MCV 82, 32 neutrophils, a granulocyte count of 1300, 61  lymphs, 6 monos.  Arterial blood gas:  pH of 7.36, pCO2 of 31.9, pO2 of 68,  bicarbonate 18, delta base -7.   The patient did well in the hospital this morning.  He is lethargic but  awake.  His mother says that he sat up last night and watched some football.   DISCHARGE PHYSICAL EXAMINATION:  VITAL SIGNS:  His blood pressure this  morning is 102/59, resting pulse 101, respirations 24, temperature  97.9,  pulse oximetry 97% on room air.  LUNGS:  His lungs are clear.  CARDIAC:  His heart shows a holosystolic murmur.  Pulses are normal.  ABDOMEN:  The abdomen is soft.  Bowel sounds are normal.  EXTREMITIES:  Extremities are normal.  NEUROLOGIC:  The patient names objects and follows commands.  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  His pupils are equal,  round, reactive.  His visual fields are full.  Symmetrical facial strength.  Midline tongue.  Motor examination in his arms and legs showed normal  strength, slightly  clumsy fine motor movements, which is baseline for the  patient.  Sensory examination is intact.  He is areflexic.   DISPOSITION:  The patient is discharged in an improved condition.   DISCHARGE MEDICATIONS:  1. Mysoline 25  mg b.i.d., and gradually increase it to 100 mg b.i.d. and     check blood levels over a period.  Changes will be made at one-week     intervals.  We will be watching very closely to see if he becomes over-     sedated.  2. This will be in addition to Tegretol 250 mg q.a.m., q. afternoon, and 300     mg q. night time.  3. Keppra 1250 mg b.i.d.  4. Topamax 125 mg b.i.d., using 100 mg and 25 mg tablets.  5. Synthroid 75 mcg q.d.  6. Chlorocon 20 mEq q.i.d.   SPECIAL INSTRUCTIONS:  No change in his vagal nerve stimulator.  The patient  will be going to a renal physician at [facility] to look at his low potassiums.                                                 Deanna Artis. Sharene Skeans, M.D.    [facility]  D:  05/10/2002  T:  05/12/2002  Job:  [id number]

## 2011-02-07 NOTE — Discharge Summary (Signed)
[place]. [facility]  Patient:    [name], [name]                      MRN: [id number] Adm. Date:  [id number] Disc. Date: 08/30/00 Attending:  Mick Sell                           Discharge Summary  DATE OF BIRTH:  08/29/1986.  FINAL DIAGNOSES: 1. Intractable seizures, simple and complex partial with secondary    generalization (345.51, 345.41, 345.11). 2. Mental retardation (317). 3. 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 4. Ventricular septal defect, asymptomatic.  PROCEDURES:  The patient was admitted to the hospital for a three-treatment course of IVIG 15 g per treatment.  He has taken and tolerated this now since August.  We have yet to have a clear-cut decrease in seizure frequency, but the patient does seem to be more alert, to be more on task in following commands.  Detailed evaluation of these seizures shows that they have ranged from 5 per month to a peak of 9 per month.  He was two weeks late for his IVIG.  He has had ER visits one or two times each month or hospital admissions on two occasions dating back to May.  For the most part, however, his seizures can be managed at home.  PHYSICAL EXAMINATION:  GENERAL:  The patient has taken and tolerated the IVIG very well. VITAL SIGNS:  His examination today shows blood pressure 93/34, resting pulse 78, respirations 20.  LUNGS:  Clear.  HEART:  Holosystolic murmur at the left sternal border.  Pulses are normal.  ABDOMEN:  Soft, bowel sounds normal.  EXTREMITIES:  Normal.  NEUROLOGIC:  The patient is awake.  He follows commands.  Cranial nerves are intact.  Motor Examination: Normal strength, tone, and mass.  Good fine motor movements.  Sensation intact to primary and cortical modalities.  Gait was not tested this morning but was normal yesterday.  He is areflexic.  DISCHARGE MEDICATIONS:  The patient is discharged home on the  following medications. 1. Keppra 500 mg 2 p.o. b.i.d. 2. Tegretol 100 mg 1-1/2 in the morning, 2 at midday, 2-1/2 at nighttime. 3. Topamax will be increased to 125 mg 3 times a day using combinations of    100 mg and 25 mg tablets.  FOLLOWUP:  The patient will be seen in the hospital again in one months time. We will see him sooner depending upon clinical need.   I will speak with Dr. Ancil Boozer concerning the duration of time that this trial should continue before we determine that some other treatment is necessary. DD:  08/30/00 TD:  08/30/00 Job: [id number]

## 2011-02-07 NOTE — Discharge Summary (Signed)
[place]. [facility]  Patient:    [name], [name]                  MRN: [id number] Adm. Date:  [id number] Disc. Date: [id number] Attending:  Mick Sell CC:         Deanna Artis. Sharene Skeans, M.D.   Discharge Summary  HISTORY OF PRESENT ILLNESS:  For complete details of chief complaint, history of present illness, past medical history, and physical examination, please refer to Dr. Marlan Palau admission H&P.  Briefly, [name] is a 25 year old male with a history of intractable seizure disorder.  He has been on multiple seizure medications in the past and recently had a vagal nerve stimulator placed in May of 2002.  This stimulator is in the process of being up regulated.  The patient was admitted on March 24, 2001 after presenting with six brief seizures at home, and then a more prolonged generalized seizure in the hospital emergency room lasting over 25 minutes.  He was given 4 mg of IV Ativan, 10 mg of rectal diazepam, and finally his seizure activity abated with 65 mg of intravenous phenobarbital.  The patient was admitted for observation.  LABORATORY DATA:  The patients CBC and differential showed a white blood cell count of 4.7 thousand, hemoglobin of 10.9, and hematocrit of 30.7.  Platelet count was 240,000.  There was 23% neutrophils, 70% lymphocytes, and 6% monocytes.  Comprehensive metabolic panel showed a potassium of 2.9, glucose 128, and all other indices were within normal limits.  The rhythm strip showed a normal sinus rhythm.  HOSPITAL COURSE:  The patient was admitted to the pediatric unit.  While in the unit, he received intravenous phenobarbital which coincided with the resolution of seizure activity.  The patient slept through the night without further seizure activity.  He was somewhat groggy in the morning but arousable and had a nonfocal examination at that point.  He was maintained on his usual medications and  received intravenous fluids.  Later in the evening, he began eating, was more alert and oriented.  The examination again was nonfocal with basically normal cranial nerves, good strength in the upper and lower extremities.  It was felt that he had arrived at maximum hospital benefit and was discharged to home.  While dictating this discharge summary, I had noticed that he had an initial potassium of 2.9.  We will be providing him with some supplemental potassium.  FINAL DIAGNOSES: 1. Breakthrough seizures. 2. Intractable seizure disorder. 3. Growth delays. 4. Hypothyroidism. 5. Status post vasal nerve stimulator implant. 6. Hypokalemia.  DISCHARGE MEDICATIONS: 1. Topamax 125 mg p.o. t.i.d. 2. Keppra 1000 mg p.o. b.i.d. 3. Tegretol 150 mg q.a.m., 200 mg at midday, and 250 mg q.p.m. 4. Synthroid 0.075 mg q.d.  DISCHARGE INSTRUCTIONS:  The patient is discharged to home with instructions to follow up with Dr. Deanna Artis. Hickling as previously scheduled on March 26, 2001 at 12 noon.  CONDITION ON DISCHARGE:  Stable.  PROGNOSIS:  Fair to guarded. DD:  03/25/01 TD:  03/26/01 Job: [id number]

## 2011-02-07 NOTE — Discharge Summary (Signed)
[place]. [facility]  Patient:    [name], [name]                  MRN: [id number] Adm. Date:  [id number] Disc. Date: [id number] Attending:  Mick Sell                           Discharge Summary  FINAL DIAGNOSES: 1. Intractable simple and complex partial seizures with occasional secondary    generalization 345.51, 345.41, 345.11. 2. Panhypopituitarism. 3. Mental retardation, moderate 318. 4. Ventricular septal defect. 5. Status post implantation of vagal nerve stimulator.  PROCEDURES:  None.  COMPLICATIONS:  None.  SUMMARY OF THE HOSPITALIZATION:  The patient is a 25 year old right-handed Caucasian young man with VSD, panhypopituitarism and intractable seizures since age 32, which began six months after he began human growth hormone.  He was admitted with a single 20 minute seizure, then had a flurry of seizures the next morning at a time when we ordinarily may have been able to discharge him to home.  From 6:45 until 8:49, he had a total of 14 identified seizures lasting anywhere from 5 seconds up to 57 seconds in duration.  The episodes involve both some esthesic simple partial seizures and complex partial seizures.  During this time from 7:05 until 8:10, he received a total of 4.5 mg of Ativan in 1 and 1.5 mg aliquots.  His seizures continued.  He was then loaded with 260 mg of phenobarbital and went to sleep and seizures stopped.  The patient was sleepy all day Saturday and also Sunday morning and the decision was made to keep him in the hospital until he was fully awake, alert and not having seizures.  Fortunately, the patient had no further seizures noted after 8:49 a.m. on the 18th.  He was able to take and tolerate his medication and oral foods and this morning is sitting up in bed looking well.  PHYSICAL EXAMINATION:  VITAL SIGNS:  On examination today, blood pressure 106/64, resting pulse 90, respirations 28, temperature  98.0 axillary.  Pulse oximetry 100% on room air.  LUNGS:  Lungs clear.  HEART:  Showed a holosystolic murmur at the left sternal border consistent with a VSD, pulse is normal.  ABDOMEN:  Soft, bowel sounds normal.  EXTREMITIES:  Normal.  The patient is small-statured, blond, mild dysmorphic features in his face.  Cranial nerves, round reactive pupils.  Visual fields full, ______ simultaneous stimuli, okay in response to ______ symmetric facial strength, midline tongue and uvula.  Air conduction greater than bone conduction bilaterally.  MOTOR EXAMINATION:  Normal strength, good fine motor movements.  Sensation intact to primary and cortical modalities.  CEREBELLAR EXAMINATION:  Good finger-to-nose, gait was not tested.  Deep tendon reflexes were absent.  LABORATORY STUDIES:  White blood cell count 3600, hemoglobin 10.5, hematocrit 30.3, MCV 73.8, platelet count 305,000.  Neutrophils 32, lymphocytes 60, monocytes 5, eosinophils 3, basophils 0, sodium 138, potassium 2.9, chloride 112, CO2 21, glucose 106, BUN 10, creatinine 0.6, calcium 8.5, total protein 6.1, albumin 3.4, AST 22, ALT 14, ALP 253, total bilirubin 0.6, carbamazepine level 11.1.  This was actually a peak, because the patient had the blood drawn somewhere around 11:30 at night, even though it was not run until the next day.  Repeat basic metabolic panel on the 19th, sodium 135, potassium 3.3, chloride 111, carbon dioxide 19, glucose 111, BUN 4, creatinine 0.7, calcium 8.7.  At present, I do not have old charts to compare to blood counts, will have to evaluate what appears to be an iron deficiency anemia and also keep in mind that the patient is showing hypokalemia and mild acidosis, which I do not believe has been present in the past.  He also has low albumin which would suggest mild decreased protein stores.  All these things need evaluation and we will continue to monitor the patient carefully.  He will be seen  back in my office Friday of this week (May 24) to initially program his vagal stimulator.  DISCHARGE MEDICATIONS: 1. Topamax 100 mg tablets and 25 mg tablets 1 p.o. t.i.d. (each). 2. Tegretol 150 mg in the morning, 200 mg at midday, 250 mg at bedtime. 3. Capra 500 mg 2 p.o. b.i.d. 4. Synthroid 0.75 mg q.d. 5. Diastat as needed for seizures. 6. Tylenol No. 3 as needed for pain (2-4 cc q.4h.)  DISCHARGE CONDITION:  He was discharged in improved condition. DD:  02/08/01 TD:  02/08/01 Job: [id number]

## 2011-02-07 NOTE — H&P (Signed)
[place]. [facility]  Patient:    [name], [name] Visit Number: [id number] MRN: [id number]          Service Type: PED Location: PEDS 6114 01 Attending Physician:  Mick Sell Dictated by:   Deanna Artis. Sharene Skeans, M.D. Admit Date:  10/24/2001                           History and Physical  DATE OF BIRTH:  1986-01-18  CHIEF COMPLAINT:  Recurrent generalized seizures.  HISTORY OF PRESENT CONDITION:  [name] is a 25 year old right-handed young man who has been admitted six times over the past six weeks with breakthrough seizures.  [name] has an intractable complex partial seizure disorder with secondary generalization and has been on multiple combinations of medication. He has been evaluated at [facility], as well as [facility] for inborn areas of metabolism and mitochondrial disease, and extensive workup has been negative.  The patient has an MRI scan which shows evidence of calcification in the basal ganglia and cerebellum, some atrophy.  He has panhypopituitarism.  He has a VSD.  He has hypercholesterolemia and has shown hypokalemia, but has responded somewhat to supplementation.  The patient was recently weaned off of Tegretol and started on Trileptal with the hope that we could maintain better seizure control on higher doses of oxcarbazepine.  The patient has had two seizures this week on Wednesday that were able to be stopped with stimulating his vagal nerve stimulator.  He has had fever, cough, malaise, decreased appetite all this week, actually stretching back into last weekend.  He did not go to school until Friday.  He was placed on antibiotics by Dr. Thurston Pounds on Tuesday night (amoxicillin).  He had effervesced on Wednesday and was well enough to go to school on Friday.  The patient showed no premonitory warning for this particular seizure. Seizure occurred while he was  asleep and aroused him from sleep around 2 a.m. He had bilateral upper extremity tonic posturing, then with punching of his fists, and then developed clonic activity.  He became unresponsive.  He was given Diastatin 10 mg at 4-5 minutes of the seizure, and the stimulator was swiped several times with no effect.  I was called around 21 minutes into the seizure and recommended that he come to the hospital for admission.  The seizure resolved spontaneously at 33 minutes.  Nonetheless, per protocol that has worked numerous times in the past, the patient was given 60 mg of phenobarbital and 2 mg of Ativan.  He has had no further seizures.  He has had no further seizures.  He has slept intermittently over the past several hours and is sleepy, but arousable at this time.  PAST MEDICAL HISTORY:  See above.  The patient had onset of his seizures shortly after starting growth hormone at about [age] years of age.  He had a brief cessation of seizures when growth hormone was stopped, only to have resumption of his seizures when he began growing on his own without the growth hormone.  Vagal nerve stimulator was planted in May of 2002.  The patients VSD has been chronic and stable.  His growth and developmental delay has been treated in the past with growth hormone.  He is not currently on the medication.  Other trials to treat seizures included of IV IG over a period of 6-8 trials during which time a [name]  was placed (August 2001). This did not make a difference in his seizures.  Hypercholesterolemia was recently discovered and he is followed by Dr. Zachery Conch at [facility].  CURRENT MEDICATIONS: 1. Trileptal 450 mg three times a day (we increase to 450 in the midday    dose on Wednesday). 2. Topamax 125 mg three times a day. 3. Keppra 1250 mg twice a day. 4. Synthroid 0.075 mg per day. 5. Potassium chloride 20 mEq three times a day. 6. Amoxicillin 500 mg three times a  day.  ALLERGIES:  DILANTIN - causes increased seizure activity.  INTOLERANCES: DEPAKOTE - caused significant vomiting, lethargy, and was more intolerable than the low doses.  REVIEW OF SYSTEMS:  See above.  The patient has also had intermittent loose stools in addition to his other symptoms.  He has not shown obvious symptoms of toxicity from Trileptal, although the seizure frequency has definitely increased over what was seen with Tegretol.  He has not been able to tolerate higher doses of Keppra or Topamax, which is why they have not been recently increased.  FAMILY HISTORY:  The patients was recently diagnosed with hypercholesterolemia.  Mother had a pulmonary embolus with a complication of hysterectomy.  SOCIAL HISTORY:  Both parents work, mother for the school system, and father as a Medical illustrator.  Both are committed to the childs care.  His older sister has often been called upon to help when the parents cannot.  They have had very good cooperation from their individual jobs to help care for [facility] numerous hospitalizations.  There are no smokers in the home.  PHYSICAL EXAMINATION:  GENERAL:  Well-developed, but small, blond-haired, blue-eyed, right-handed young man in no distress, sleepy, lying in the bed.  VITAL SIGNS:  Temperature 97.6, resting pulse 101, respirations 28, blood pressure 111/57, pulse oximetry 99% on room air.  HEENT:  No signs of infection in terms of inflammation of the conjunctivae or the oropharynx, there is a clear mucoid discharge from his nose and a wet cough.  NECK:  Supple, no bruits were noted.  LUNGS:  Clear.  No wheezes or rales were heard.  HEART:  Showed a holosystolic murmur at 2/6 at the left sternal border radiating to the precordium.  Pulses were normal.  ABDOMEN:  Soft.  Scaphoid:  No hepatosplenomegaly.  EXTREMITIES:  No edema or cyanosis or altered tone.  SKIN:  No rash.  NEUROLOGIC:  The patient was awake and alert.  He was  able to follow commands. Mild dysarthria.  He was also hoarse.  Cranial nerves:  Round and reactive  pupils, fundi normal visual fields, full to double simultaneous stimuli. Symmetric facial strength, midline tongue.  Air conduction greater than bone conduction bilaterally.  Motor examination:  4-4+/5 strength in his limbs.  He wiggles his fingers.  He had good fine motor movements.  There was no drift.  Cerebellar:  Showed slow movements, but no dystaxia.  Sensory examination:  No peripheral neuropathy. He had good stereognosis.  Deep tendon reflexes are absent.  He had bilateral flexor plantar responses.  Gait was not tested.  IMPRESSION: 1. 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    345.41.  345.11. 2. Upper respiratory infection with bronchitis with a negative chest x-ray    (I reviewed). 3. Mild hyponatremia which may be secondary to Trileptals effect on    antidiuretic hormone. 4. Ventricular septal defect, congenital. 5. Hypercholesterolemia. 6. Panhypopituitarism. 7. Anemia which is chronic and stable  PLAN:  We will  hold his medications steady, increasing Topamax and Keppra has led to drowsiness.  Trileptal has not yet caused any symptoms other than lowering his sodium.  We will continue potassium chloride supplementation.  [name] will be watched and discharged on the same medications today; however, plan call for increasing his Trileptal over the weeks to come.  We wil also explore the possibility of a ketogenic diet with [facility].  Other options that we have of medicines he has not taken include Zonegran, possibly phenobarbital, which has certainly brought his seizures under control in the postictal state, but carries sedation and possible increased frequency of seizures with it, as well as tiagabine.  The patient may be discharged today if he begins to come around, has no further seizures, and seems to be returning to  baseline. Dictated by:   Deanna Artis. Sharene Skeans, M.D. Attending Physician:  Mick Sell DD:  10/24/01 TD:  10/24/01 Job: [id number]

## 2011-02-07 NOTE — H&P (Signed)
[place]. [facility]  Patient:    [name], [name]                      MRN: [id number] Adm. Date:  [id number] Attending:  Mick Sell                         History and Physical  CHIEF COMPLAINT: This patient is a 25 year old with a long history of intractable seizure disorder, with focal right body seizures with occasional secondary generalization.  HISTORY OF PRESENT ILLNESS: He has never had control.  He has been on a variety of medications.  He has seen experts in [place] and [place], [place].  He is on Topamax, Tegretol, Keppra, and IVIG as well as Synthroid.  He has recurrent bouts of status epilepticus requiring hospital admission every four to six weeks.  He has had multiple seizures at home today and last night, including many brief focal seizures as well as one 28 minute seizure.  His father notes that he has been ill with an upper respiratory infection this past week.  REVIEW OF SYSTEMS: Otherwise unremarkable.  SOCIAL HISTORY/FAMILY HISTORY, PAST MEDICAL HISTORY: Please see previous multiple charts for details.  CURRENT MEDICATIONS:  1. Tegretol 150 mg in the morning, 200 mg at mid day, 250 mg at night for a     total of 600 mg q.d.  2. Topamax 125 mg t.i.d.  3. Keppra 1000 mg b.i.d.  4. Synthroid 0.75 mg q.d.  ALLERGIES:  1. DILANTIN.  2. DEPAKOTE.  PHYSICAL EXAMINATION:  GENERAL: He is mildly drowsy but will converse with the examiner, crying frequently.  NEUROLOGIC: Extraocular movements were full.  No facial symmetry.  Good grips bilaterally.  He is moving all four extremities on command.  Two focal motor seizures were observed during the period of examination, with contraction of the right with slight head turning to the right, and cessation of conversation and fixation.  IMPRESSION: Breakthrough seizure.  PLAN: Admit to observe.  We will use Ativan, about 1 mg p.r.n., for seizures and try to limit to 4  mg in a 12 hour period.  Generally this solves his problem and hopefully that will be the case again. DD:  09/12/00 TD:  09/13/00 Job: [id number]

## 2011-02-07 NOTE — H&P (Signed)
[place]. [facility]  Patient:    [name], [name]                  MRN: [id number] Adm. Date:  [id number] Attending:  Mick Sell                         History and Physical  HISTORY OF PRESENT ILLNESS:  [name], [name] is a 25 year old white male -- born [birth year] -- with a history of intractable seizure disorder.  This patient has been on multiple seizure medications in the past and recently had a vagal nerve stimulator placed in May of 2002.  This patient has flurries of seizures, often times requiring hospitalization, was last admitted to [facility] on March 04, 2001.  Patient comes in today with six brief seizures at home.  Patient has had a prolonged generalized seizure event in the hospital emergency room lasting over 25 minutes.  Patient has not responded completely to 4 mg of IV Ativan and 10 mg of rectal diazepam.  This patient is currently on Topamax, Tegretol and Keppra.  Patient has no intercurrent illness, has had occasional cough today, but no definite fevers, chills or diarrhea.  Patient is admitted for evaluation of the above seizure problem.  PAST MEDICAL HISTORY: 1. History of intractable seizure disorder. 2. Mild mental retardation. 3. History of panhypopituitarism with growth delay. 4. History of a vagal nerve stimulator placement, May of 2002. 5. History of hypothyroidism.  CURRENT MEDICATIONS: 1. Topamax 125 mg t.i.d. 2. Keppra 1000 mg b.i.d. 3. Tegretol 150 mg in the morning, 200 mg at midday, 250 mg in the evening. 4. Synthroid 0.075 mg daily.  ALLERGIES:  Patient has allergies to DILANTIN and DEPAKOTE.  SOCIAL HISTORY:  Patient lives at home with the parents.  Patient is in the fifth grade.  Patient does have one sister.  FAMILY MEDICAL HISTORY:  Negative for seizures.  Patients sister does have migraine headaches.  REVIEW OF SYSTEMS:  Notable for no complaints of headaches.   Patient has had occasional cough today.  No fevers, chills, diarrhea, abdominal pain.  Patient is lethargic at this time; review of systems cannot be reviewed with the patient himself.  PHYSICAL EXAMINATION:  VITALS:  Blood pressure is 106/48.  Heart rate 84.  Respiratory rate 22. Temperature:  Afebrile.  GENERAL:  This patient is a small-for-age white male who is sleepy at the time of this evaluation.  HEENT:  Head is atraumatic.  Eyes:  Pupils are equal, round and reactive to light.  Disks are flat bilaterally.  NECK:  Supple.  RESPIRATORY:  Clear.  CARDIOVASCULAR:  Regular rate and rhythm.  No obvious murmurs or rubs noted.  ABDOMEN:  Positive bowel sounds.  No organomegaly or tenderness noted.  EXTREMITIES:  Without significant edema.  NEUROLOGIC:  Cranial nerves as above.  Again, facial symmetry is present. Patient is not alert enough for full visual field testing.  Patient will move all four extremities between the seizures.  Deep tendon reflexes are depressed but symmetric.  Toes are neutral bilaterally.  Patient will respond to pain stimulation in all fours.  Patient is not alert enough to cooperate for cerebellar testing and could not be ambulated.  LABORATORY VALUES:  Notable for a WBC of 4.7, hemoglobin of 10.9, hematocrit of 30.7, MCV of 74.3, platelets of 240,000.  Comprehensive metabolic profile is pending.  IMPRESSION: 1. History of intractable seizures  with recurrent seizure events. 2. Mild mental retardation. 3. Growth delay.  This patient has had prolonged seizure today lasting over 25 minutes.  Patient will be brought in for treatment of the seizure event and for observation.  PLAN: 1. Treatment with phenobarbital 65 mg IV now. 2. Continue p.o. anticonvulsants. 3. Seizure precautions. 4. Admission labs as above. 5. Will follow patients clinical course while in house. DD:  03/24/01 TD:  03/25/01 Job: [id number]

## 2011-02-07 NOTE — Discharge Summary (Signed)
NAME:  [name], [name] NO.:  [id number]   MEDICAL RECORD NO.:  [id number]          PATIENT TYPE:  INP   LOCATION:  6126                         FACILITY:  [facility]   PHYSICIAN:  Deanna Artis. Hickling, M.D.DATE OF BIRTH:  03-20-1986   DATE OF ADMISSION:  08/06/2006  DATE OF DISCHARGE:  08/07/2006                                 DISCHARGE SUMMARY   FINAL DIAGNOSES:  1. [name] infections, Staph epidermidis.  2. Complex partial seizures with secondary generalization.  3. Mental retardation.  4. Organic gait disorder.  5. Brown's syndrome.  6. Ventriculoseptal defect.  7. Hypokalemia.   PROCEDURES:  Blood cultures of [name].   COMPLICATIONS:  None.   SUMMARY OF THE HOSPITALIZATION:  The patient was admitted with increased  frequency of seizures and temperature of 100.6.  He was alert and showed no  manifestations of sepsis.  Nonetheless, because we have experienced problems  with catheter infection before, and the catheter had been in entered in  order to treat his seizures, suspicion was increased.   The patient had a white blood cell count of 5100, hemoglobin of 13.1,  hematocrit 38.3, MCV 83.8, neutrophils 78%, absolute granulocytes 4000,  lymphocytes 14%, monocytes 8%, eosinophils and basophils 0%.   The patient's fever dropped on vancomycin 550 mg four times a day.  Culture  came back around 6p.m. positive for gram-positive cocci in clusters.  In all  likelihood, this is a Staph epidermidis infection again.  Because of the  patient's VSD and the presence of this catheter, all attempts will be made  to try to sterilize the catheter.  If it fails, we will need to perform  echocardiogram of his heart to make certain that there are no lesions there  and also remove the [name].   The patient has had treatment with vancomycin at home for [facility]  and we will endeavor to set up again and discharge him today.   On examination today,  temperature 36.3, resting pulse 78, respirations 20,  oxygen saturation 98%.  LUNGS: Clear.  He has a VSD murmur that is  unchanged.  Bowel sounds are normal.  Extremities were negative.  He looks  well and is awake, alert, dysarthric, mental retardation all at baseline.  Visual fields are full.  Extraocular movements are full, but at times  disconjugate because of his Brown's syndrome.  Symmetric facial strength.  Midline tongue and uvula.  Near normal strength.  Fine motor movements  clumsy; this is also at baseline; he is areflexic.   The patient is discharged in improved condition.  We will request assistance  through [facility] and treat him with vancomycin 550 mg every 8  hours for a total of 13 more days.  We will then reculture him 3 days after  the medications have been stopped to look for results.   I have asked my office to be notified if this is not the correct dose.   MEDICATIONS:  1. Keppra 1250 mg twice daily.  2. Tegretol 250 mg in the morning and at 2:00 p.m. and 300 mg at  bedtime.  3. Topamax 150 mg three times a day, in the morning, at 2 p.m. and at      bedtime.  4. Mysoline 125 mg in the morning and 150 mg at nighttime.  5. K-Phos 500 mg four times a day.  6. Klor-Con four times a day.  7. Synthroid 75 mcg in the morning.   These will all be continued on discharge without change.      Deanna Artis. Sharene Skeans, M.D.  Electronically Signed     [facility]/MEDQ  D:  08/07/2006  T:  08/07/2006  Job:  [id number]

## 2011-02-07 NOTE — Discharge Summary (Signed)
   NAMESIM, [address]QUET[address]                     ACCOUNT NO.:  [id number]   MEDICAL RECORD NO.:  [id number]                   PATIENT TYPE:  INP   LOCATION:  6118                                 FACILITY[facility]  [facility]   PHYSICIAN:  Pediatrics Resident                 DATE OF BIRTH:  [birth year]   DATE OF ADMISSION:  07/23/2003  DATE OF DISCHARGE:  07/29/2003                                 DISCHARGE SUMMARY   ADDITIONAL LABS AND TEST FINDINGS:  1. Blood culture drawn July 25, 2003, grew gram-positive rods after it     was 63-1/2 days old.  This was presumed to be a contaminant.  2. Chest x-ray on November 5 showed no acute disease.   In addition, prior to discharge, the patient received a flu shot.                                                Pediatrics Resident    PR/MEDQ  D:  07/29/2003  T:  07/29/2003  Job:  [id number]

## 2011-02-07 NOTE — H&P (Signed)
NAME:  [name], [name]                     ACCOUNT NO.:  [id number]   MEDICAL RECORD NO.:  [id number]                   PATIENT TYPE:  INP   LOCATION:  6151                                 FACILITY:  [facility]   PHYSICIAN:  Genene Churn. Love, M.D.                 DATE OF BIRTH:  1986/01/10   DATE OF ADMISSION:  10/27/2002  DATE OF DISCHARGE:                                HISTORY & PHYSICAL   CHIEF COMPLAINT:  This is one of multiple [facility] admissions for  this 25 year old right-handed white single male admitted from the emergency  room for evaluation of recurrent seizures.   HISTORY OF PRESENT ILLNESS:  The patient has a long history of complex  partial seizures that are intractable despite the use of multiple different  anticonvulsants, and a vagal nerve stimulator.  Rarely, his seizures are  stopped by using his stimulator.  He was admitted in 12/03, with a viral  illness, had a low-grade fever at that time, with a Rotavirus.  He has had  two admissions since then for intractable seizures, 09/25/02 and 10/06/02.  Usually he is treated per [name] with 2 mg of IV Ativan and 65 mg of IV  phenobarbital.  Early on 10/26/02, he had a seizure about 5:30 a.m., a repeat  seizure at 5:30 p.m., and since midnight from 10/25/02 to 10/26/02, over about  an hour he had about five seizures, each lasting usually less then two  minutes, usually characterized by loss of consciousness, a stare, and brief  shaking for a few minutes.   MEDICATIONS:  1. Tegretol 250 mg t.i.d.  2. Topamax 125 mg q.i.d. at 7, 1, and 9.  3. Keppra 1250 mg b.i.d. at 7 and 9.  4. Glycine 125 mg at 7 and 250 mg at 9.  5. KCL 20 mEq q.i.d. at 7, 1, 5, and 9.  6. Synthroid 0.075 mg daily.   PAST MEDICAL HISTORY:  1. Growth retardation.  2. Mental retardation.  3. Intractable seizures.  Status post vagal nerve stimulator, status post     surgery for Brown's syndrome to his eyes.  4. History of Rotavirus  infection.  5. Low potassium, possibly secondary to a renal tubular acidosis.   ALLERGIES:  DEPAKOTE.   FAMILY HISTORY:  Has been dictated elsewhere.   PHYSICAL EXAMINATION:  GENERAL:  A well-developed thin white male who is  poorly developed.  VITAL SIGNS:  His blood pressure in the right and left arm is 110/60 and  120/60, heart rate was 92 and regular.  There were no bruits.  He was  afebrile.  NEUROLOGIC:  Mental status:  He is alert and oriented, followed commands.  His cranial nerve examination revealed visual fields full, disks flat, he  had dysarthria, he had ptosis.  He had decreased lateral movement of one of  his eyes.  Tongue was midline, the uvula was midline.  Gags were  present.  Motor examination revealed that he moved all extremities.  He had ulnar hand  posturing of his hands when held out straight.  He felt vibration in all  extremities.  He felt pinprick in all extremities.  He had absent deep  tendon reflexes.  HEENT:  Tympanic membranes were clear.  LUNGS:  Clear to auscultation.  HEART:  No murmurs or gallops.  Sounds were normal.  He had a left anterior  chest vagal nerve stimulator and right [name].   IMPRESSION:  1. Intractable complex partial seizures, code 345.41.  2. Growth delay, code 783.4.  3. Mental retardation, code 731.17.  4. Hypothyroidism, code 244.9.  5. Possible history of renal tubular disease with hypokalemia, code 276.8.   PLAN:  Admit the patient for observation.                                                Genene Churn. Sandria Manly, M.D.    JML/MEDQ  D:  10/27/2002  T:  10/27/2002  Job:  [id number]

## 2011-02-07 NOTE — H&P (Signed)
NAME:  [name], ECKRICH               ACCOUNT NO.:  [id number]   MEDICAL RECORD NO.:  [id number]          PATIENT TYPE:  INP   LOCATION:  6148                         FACILITY:  [facility]   PHYSICIAN:  Pramod P. Pearlean Brownie, MD    DATE OF BIRTH:  Jan 01, 1986   DATE OF ADMISSION:  07/25/2004  DATE OF DISCHARGE:                                HISTORY & PHYSICAL   REASON FOR ADMISSION:  Seizures.   HISTORY OF PRESENT ILLNESS:  [name] is a 25 year old Caucasian male who  is well known to the neurology service for recurrent hospitalizations for  breakthrough seizures. The patient is not able to provide history as he is  postictal and appears confused. His sister is accompanying him and provides  the history. The patient has had about ten episodes of brief simple partial  seizures with focal jerking of the right upper extremity and face, which  have been brief in induration. This was similar to his previous seizures and  apparently has been provoked by his recent episode of bronchitis which he  has been having for the last week. He was treated with flu vaccine a couple  of weeks ago. The patient's seizure breakthrough has often been precipitated  by being up at night or watching racing events, which he is an avid fan of.  The patient is not actively seizing, but he had called the office earlier  today complaining of multiple breakthrough seizures. The patient has a  special IV seizure treatment protocol in place in the emergency room which  includes IV phenobarbital 65 mg as well as 2 mg of Ativan which usually  breaks his seizure cycle quite well. He has a longstanding history of  refractory seizure disorder which began at the age of 43. He has several  distinct types of seizures including generalized tonic, clonic seizures and  simple partial seizures, which are either brief or prolonged, and complex  partial seizures. He has a vagal nerve stimulator that was implanted in  2002, which seems to have  helped his generalized tonic, clonic seizures;  however, the simple partial events have not been helped as much. He has had  multiple hospitalizations in recent years for the simple partial seizures  and he has responded fairly well to phenobarbital and Ativan. He has been  found to be allergic to DILANTIN which increases his seizure frequency and  Depakote causes multi-organ dysfunction.   His present seizure medication includes Tegretol, Keppra, Topamax, and  primidone.   PAST MEDICAL HISTORY:  1.  Growth retardation.  2.  Growth hormone deficiency.  3.  Mild mental retardation.  4.  Long-standing seizure disorder.  5.  Hypothyroidism.  6.  Scoliosis.  7.  __________ syndrome.  8.  Mitral valve prolapse.   FAMILY HISTORY:  Nonsignificant for family members with seizures.   HOME MEDICATIONS:  1.  Tegretol 250 mg b.i.d. and 300 mg at night.  2.  Topamax 150 mg t.i.d.  3.  Keppra 1250 mg b.i.d.  4.  Synthroid 75 mcg daily.  5.  Potassium 20 mEq q.i.d.  6.  Primidone  125 in the morning and 250 at night.   PAST SURGICAL HISTORY:  1.  Vagal nerve stimulator implantation.  2.  [name] for IV access.   MEDICATION ALLERGIES:  DILANTIN, DEPAKOTE, and DIPHENHYDRAMINE.   SOCIAL HISTORY:  The patient lives at home with his parents. He is in the  8th grade. He has physical retardation of growth.   REVIEW OF SYSTEMS:  Significant for recent cough, upper respiratory tract  infection, bronchitis, and flu shot. No chest pain or diarrhea.   PHYSICAL EXAMINATION:  GENERAL: This is a frail, adolescent male who looks a  lot physically smaller than his stated age.  VITAL SIGNS: At present, he is febrile with pulse rate of 70 per minute,  respiratory rate 18 per minute.  HEENT: Head is normocephalic and atraumatic without bruits.  NECK: Supple without bruits.  CHEST: Vagal nerve stimulator generator in the left infraclavicular region.  He has scoliosis.  ABDOMEN: Scaphoid and  nontender.  NEUROLOGIC: The patient is drowsy, but does open eyes and follows commands.  His movements are full range. Pupils are reactive. Face is symmetric. Moves  all four extremities clear against gravity. Reflexes are symmetric. Toes are  downgoing. Coordination and gait are not tested.   DATA REVIEWED:  Previous multiple office notes from Dr. Sharene Skeans are  reviewed. Recent hospitalization in August were also reviewed. At that time,  carbamazepine level dated June 24th was 9.9, phenobarbital level 12.4.   IMPRESSION:  A 25 year old male with intractable, simple and complex partial  seizure with a known history of developmental delay, growth hormone  deficiency, and refractory seizures, status post vagal nerve stimulator  implantation and multiple seizure medications.   PLAN:  Will institute his refractory seizure protocol by giving him  phenobarbital 65 mg and 2 mg of Ativan IV. If there are no further seizures,  will continue his maintenance anticonvulsant regimen of Tegretol, Keppra,  Topamax, and primidone. Will check chest x-ray given his recent bronchitis.  Will have the clinic residents manage his upper respiratory symptoms. Dr.  Sharene Skeans will take over the care of the patient in the morning. I had a long  discussion with the patient's sister with regards to the treatment and  answered questions.       PPS/MEDQ  D:  07/25/2004  T:  07/25/2004  Job:  [id number]   cc:   Fonnie Mu, M.D.  Fax: [phone]

## 2011-02-07 NOTE — H&P (Signed)
[place]. [facility]  Patient:    [name], [name] Visit Number: [id number] MRN: [id number]          Service Type: PED Location: [phone] Attending Physician:  Mick Sell Dictated by:   Gustavus Messing Orlin Hilding, M.D. Admit Date:  04/12/2002 Discharge Date: 04/13/2002   CC:         Deanna Artis. Sharene Skeans, M.D.   History and Physical  CHIEF COMPLAINT:  Seizure.  HISTORY OF PRESENT ILLNESS:  This is one of multiple frequent hospital admissions for this 25 year old with intractable seizure disorder, multiple other medical problems, with a flurry of seizures in the last couple of days.  Two days ago he had two to three partial seizures, and then today he was found on the floor by his mother with a more prolonged full generalized seizure without any tongue-biting this time.  This occurs quite often, and he requires brief hospitalization after Ativan and phenobarbital, both of which make him groggy but does control the seizure.  REVIEW OF SYSTEMS:  No new complaints.  PAST MEDICAL HISTORY:  Intractable seizures, Tegretol, Topamax, Keppra, and vagal nerve stimulator.  He has developmental delay and growth delay, mitral valve prolapse with arrhythmia, hypothyroidism, Brown syndrome with _____ on his eyes.  He has also had a [name].  MEDICATIONS:  He takes Tegretol 250 mg twice a day plus 300 mg q.h.s., Topamax 125 mg t.i.d., Keppra 1250 mg b.i.d., and Synthroid 0.075 mg once a day, potassium chloride 20 mEq four times a day.  ALLERGIES:  DILANTIN and DEPAKOTE.  SOCIAL HISTORY:  Rising sixth grader.  Lives with his parents and sister.  FAMILY HISTORY:  Negative for seizures.  Positive for migraine.  Positive for diabetes.  PHYSICAL EXAMINATION:  VITAL SIGNS:  Stable.  GENERAL:  He is groggy.  Small for his age.  Mildly dysmorphic.  Mildly groggy but follows commands readily, answers questions.  NEUROLOGIC:  Pupils are equal  and reactive to light.  He has full extraocular movements.  No facial weakness.   Mildly dysarthric.  Motor examination: Cachectic-appearing but good strength in all four extremities.  Deep tendon reflexes are diminished diffusely.  Intact coordination and sensation.  LABORATORY DATA:  Normal except potassium slightly low at 3.3, CO2 is low at 15, which may mean he is somewhat acidotic after his seizure.  Hematocrit is 34.  IMPRESSION:  Intractable seizure disorder.  PLAN:  Treat with 0.2 mg/kg Ativan IV and 2 mg/kg phenobarbital IV.  Observe for 23 hours.  Dr. Sharene Skeans will see in the morning.  Will check ACTH as requested by Dr. Zachery Conch, his endocrinologist at [facility]. Dictated by:   Gustavus Messing Orlin Hilding, M.D. Attending Physician:  Mick Sell DD:  04/12/02 TD:  04/16/02 Job: [id number]

## 2011-02-07 NOTE — Discharge Summary (Signed)
[place]. [facility]  Patient:    [name], [name] Visit Number: [id number] MRN: [id number]          Service Type: MED Location: PEDS [phone] Attending Physician:  Lesly Dukes Dictated by:   Marlan Palau, M.D. Admit Date:  08/18/2001 Discharge Date: 08/19/2001                             Discharge Summary  ADMISSION DIAGNOSES: 1. History of chronic seizure disorder with intractable seizures, recurrent    seizures on admission. 2. Mild mental retardation. 3. Pan hypopituitarism, growth delay.  DISCHARGE DIAGNOSES:  Chronic seizure disorder with intractable seizures, recurrent seizures on this admission.  PROCEDURE:  None.  COMPLICATIONS:  None.  HISTORY OF PRESENT ILLNESS:  [name] is a 25 year old right-handed white male born 30-Aug-1986 with a history of chronic seizure disorder who has been treated through [facility] on multiple medications and has a vagal nerve stimulator placement.  Patient is on topamax, Tegretol, and Keppra with incompletely controlled seizures. Patient had recently developed a dry, nonproductive cough over the last five days.  No fevers associated with this.  Patient has had several days with one or two brief seizures prior to this admission but on the day of admission had a prolonged 25 minute generalized type seizure event and has had four or five brief focal seizures, mainly involving the right arm in the emergency room. Patient was admitted for further evaluation and management.  Patient was given 2 mg of Ativan in the emergency room and received a total of 60 mg of IV phenobarbital.  Patients father states that vagal nerve stimulator has resulted in some improvement in seizure frequency.  PAST MEDICAL HISTORY: 1. History of intractable seizures. 2. Mild mental retardation. 3. Pan hypopituitarism with growth delay. 4. History of vagal nerve stimulator placement. 5.  History of hypothyroidism.  MEDICATIONS: 1. Synthroid 0.75 mg q.d. 2. Topamax 125 mg t.i.d. 3. Tegretol 150 mg q.a.m., 200 mg midday, 250 mg q.p.m. 4. Keppra 1250 mg b.i.d.  ALLERGIES:  DILANTIN, DEPAKOTE.  SOCIAL HISTORY:  Does not smoke or drink.  Please refer to history and physical dictation summary.  FAMILY HISTORY:  Please refer to history and physical dictation summary.  REVIEW OF SYSTEMS:  Please refer to history and physical dictation summary.  PHYSICAL EXAMINATION:  Please refer to history and physical dictation summary.  LABORATORIES:  Tegretol level 7.7.  Urinalysis reveals specific gravity 1.011, pH 7.0.  White count 5.8, hemoglobin 12.1, hematocrit 34.5, MCV 77, platelets 250,000.  Sodium 139, potassium 3.2, chloride 111, CO2 21, glucose 116, BUN 10, creatinine 0.7, calcium 9.1, total protein 6.5, albumin 3.6, AST 22, ALT 19, alkaline phosphatase 241, total bilirubin 0.6.  HOSPITAL COURSE:  This patient has done well during the course of hospitalization.  Patient was admitted through the emergency room for further evaluation.  Did not have any recurring seizures since admission.  Patient slept well, has eaten well, bright, alert, and cheerful at the time of this evaluation today.  Patient has been kept on his usual dosing of medications and is to be discharged today taking Synthroid 0.75 mg one q.d., topamax 125 mg one t.i.d., Tegretol 150 mg q.a.m., 200 mg midday, 250 mg q.h.s., Keppra 1250 mg one b.i.d.  Patient will follow up with [facility] as needed.  No dietary restrictions given.  At the time of discharge  patient is bright, alert, and cooperative.  Moves all four extremities.  At neurologic baseline. Dictated by:   Marlan Palau, M.D. Attending Physician:  Lesly Dukes DD:  08/19/01 TD:  08/19/01 Job: [id number]

## 2011-02-07 NOTE — Discharge Summary (Signed)
NAMENORBERT, MALKIN 111                     ACCOUNT NO.:  [id number]   MEDICAL RECORD NO.:  [id number]                   PATIENT TYPE:  INP   LOCATION:  6118                                 FACILITY:  [facility]   PHYSICIAN:  Deanna Artis. Sharene Skeans, M.D.           DATE OF BIRTH:  12/13/1985   DATE OF ADMISSION:  02/20/2003  DATE OF DISCHARGE:  02/22/2003                                 DISCHARGE SUMMARY   ADDENDUM   The patient was scheduled to be discharged after 24 hours of observation,  but unfortunately had a series of seizures that appeared to be complex  partial in nature with slight chronic movement of his extremities.  These  lasted for about one minute or two and left him in a postictal confusion.  He was treated with the second dose of Ativan 2 mg and phenobarbital 65 mg  and responded with sleep.   His laboratory studies included a phenobarbital level of 13.7, Primidone is  pending at this time, Carbamazepine 12.9 and Topiramate levels are pending  at this time.  CBC, which was not available yesterday, shows the white count  of 4100, 59% neutrophils, 33% lymphs, 5% monos, 3% eos with absolute  granulocyte count 2400, hemoglobin 10.9, hematocrit 31.1 and MCV of 75.7.  There were some schistocytes present and acanthocytes.  I will need to check  my records at the office to see if this is the lowest hemoglobin that he has  had.  This would suggest that perhaps he is iron deficient and would need to  add iron into his treatment regimen.   This morning, the patient is sleepy, but otherwise well.  He has a blood  pressure of 84/50, resting pulse 79, respirations 24 and pulse oximetry is  100%.  Lungs are clear.  Heart shows a holosystolic murmur at the left  sternal border consistent with VSD.  Pulses are normal.  Abdomen is soft,  scaphoid with bowel sounds normal.  There is no hepatosplenomegaly.  Extremities were thin.  He has tight heel cords.  Neurologic exam shows him  to  be sleepy, arousable and follows commands.  Cranial nerves with round  reactive pupils, visual fields full, symmetric facial strength, midline  tongue.  Motor examination with him gripping and wiggles his fingers.  He  has mild decreased strength globally, but symmetric.  Sensory examination  withdrawing x4.  Reflexes are diminished and absent.  He has bilateral  plantar responses.   IMPRESSION:  1. 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 (345.5) (345.4) (345.11)  2. Mental retardation. (317)  3. Mild diplegia. 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 Will increase the patient's Topamax to 150 mg three times a day.  2. Tegretol will stay stable at 350 mg in the morning and 300 mg at     nighttime.  3. Keppra 1250 mg twice a day.  4. Mysoline 125 in  the morning and 250 at nighttime.  5. Synthroid 75 mcg per day.  6. K-Dur 20 mEq four times a day.    CONDITION ON DISCHARGE:  Discharged in improved condition.  He can go to  school today.   FOLLOW UP:  Return visit at the next regularly scheduled time.                                               Deanna Artis. Sharene Skeans, M.D.    [facility]  D:  02/22/2003  T:  02/22/2003  Job:  [id number]   cc:   Fonnie Mu, M.D.  [address] [place]  [place] [address]  Fax: [phone]

## 2011-02-07 NOTE — Discharge Summary (Signed)
NAME:  [name], [name]                         ACCOUNT NO.:  [id number]   MEDICAL RECORD NO.:  [id number]                   PATIENT TYPE:  INP   LOCATION:  6125                                 FACILITY:  [facility]   PHYSICIAN:  Deanna Artis. Sharene Skeans, M.D.           DATE OF BIRTH:  Feb 09, 1986   DATE OF ADMISSION:  09/02/2002  DATE OF DISCHARGE:  09/03/2002                                 DISCHARGE SUMMARY   FINAL DIAGNOSES:  1. Breakthrough seizures, intractable generalized tonic-clonic (345.11),     simple partial with somatosensory symptoms (348.51), and complex partial     (345.41).  2. 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  3. Ventriculoseptal defect.  4. 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  783.42   PROCEDURES:  None.   COMPLICATIONS:  None.   HOSPITAL COURSE:  The patient was admitted to the hospital following a five-  minute generalized tonic-clonic seizure.  He was treated with 2 mg of  diazepam.  He then had a series of seven brief somatosensory seizures.  They  extended down his left arm.  In the past they resulted in a more prolonged  generalized tonic-clonic seizure.   The patient had been given 10 mg of rectal Diastat at the beginning of the  somatosensory seizures and the symptoms did not stop over a period of about  45 minutes.   He was therefore admitted to the hospital and given 60 mg of phenobarbital  and 3 mg of Ativan.   He tolerated this well and indeed he did not suffer the extreme somnolence  that ordinarily happens.  This is the case, because I think that we were  treating a patient who was not having complex partial or generalized tonic-  clonic seizures.  This quelled the seizures without totally sedating the  young man.   Earlier in the week, we had tried to drop one dose of Topamax out of his  regimen because we believe that Topamax has caused hypokalemia.  The patient  has not had somatosensory  seizures in quite some time.  The appearance of  somatosensory seizures three or four days after dropping the dose of Topamax  in their prolonged nature requiring hospitalization has persuaded that we  are not going to be able to taper Topamax successfully.   The one medication that he is taking at this time that I suspect has little  biologic activity is Mysoline.  This was started with the hope that  additional phenobarbital in his system might prevent seizures because it is  very effective in breaking an acute seizure.   PHYSICAL EXAMINATION:  GENERAL APPEARANCE:  On examination today, the  patient is awake, mildly lethargic, and in no distress.  VITAL SIGNS:  Temperature 97 degrees, pulse 107, respirations 32, pulse  oximetry 100%.  HEENT:  No signs of infection.  NECK:  Supple.  Full range of motion.  LUNGS:  Clear to auscultation.  HEART:  Holosystolic murmur at the left sternal border radiating to the  precordium.  ABDOMEN:  Soft and nontender.  No hepatosplenomegaly.  EXTREMITIES:  Well formed.  The heel cords are slightly tight.  NEUROLOGIC:  Mental Status:  The patient is awake and lethargic.  He is  mildly retarded, but responsive.  He follows commands, names objects, and  recognizes me.  Cranial Nerves:  Round, reactive pupils.  Visual fields  full.  Extraocular movements full and conjugate.  Symmetric facial strength.  Midline tongue and uvula.  Motor Examination:  Near normal strength, tone,  and mass in all of his extremities.  Fine motor movements were normal.  There was no pronator drift.  Gait was not tested.  Reflexes were diminished  to absent.   DISPOSITION:  The patient will go home.   DISCHARGE MEDICATIONS:  Tegretol 250 mg in the morning and at lunch and 300  mg at nighttime.  Topamax will be changed back to its original dose of 125 mg three times a  day.  Keppra 1250 mg twice daily.  Mysoline 250 mg twice daily.  Synthroid 75 mcg daily as needed.   Diastat 10 mg rectally.  Diazepam 2 mg orally.   We will explore with the family whether or not it is possible to safely give  the patient intravenous phenobarbital and Ativan at home.  I am reluctant to  do this because strict sterile procedure must be obtained and this is  something that we only allow the IV team to use.  By the time that an IV  team person could come to their house to do this, the patient would be on  his way to the hospital.  Another issue that we will explore is whether or  not to slowly taper and discontinue Mysoline because I feel that it has not  been biologically active for the patient.  I appreciate the opportunity to  see him.   CONDITION ON DISCHARGE:  He is discharged home in good condition.   FOLLOW-UP:  He will return to see me at his usual regularly scheduled visit.   LABORATORY DATA:  Bilirubin 0.3, direct bilirubin 0.0, alkaline phosphatase  203, SGOT 19, SPGT 20, total protein 6.2, albumin 6.3.  Sodium 133,  potassium low at 2.9, chloride 107, CO2 21, glucose 98, BUN 13, creatinine  0.6, albumin 3.2, calcium 8.5, phosphorus 3.6, magnesium 1.8.  Phenobarbital  16.1.  The mysoline level is pending.  The WBC was 3500, hemoglobin 11.0,  hematocrit 31.8, MCV 77.6, platelet count 244,000, 31 neutrophils, 65  lymphs, 2 monocytes, 1 eosinophil, 1 basophil, and absolute neutrophil count  1100.  Tegretol/carbamazepine level 11.5.  Will make the parents aware of  the low potassium and he will need to continue potassium chloride 20 mEq  four times a day.                                               Deanna Artis. Sharene Skeans, M.D.    [facility]  D:  09/03/2002  T:  09/04/2002  Job:  [id number]

## 2011-02-07 NOTE — H&P (Signed)
NAME:  [name], [name]                      ACCOUNT NO.:  [id number]   MEDICAL RECORD NO.:  [id number]                   PATIENT TYPE:  INP   LOCATION:  6120                                 FACILITY:  [facility]   PHYSICIAN:  Genene Churn. Love, M.D.                 DATE OF BIRTH:  [birth year]   DATE OF ADMISSION:  08/14/2002  DATE OF DISCHARGE:                                HISTORY & PHYSICAL   NOTE:  This is one of multiple hospital admissions for this 25 year old  right-handed white male, admitted from the emergency room for evaluation of  intractable complex partial seizures.   HISTORY OF PRESENT ILLNESS:  The patient has a long history of complex  partial and secondary generalized seizures, and status epilepticus,  averaging an admission about once per month to [name], despite  the use of a vagal nerve stimulator, IVIG therapy, and conventional  anticonvulsant treatments.  He frequently has hypokalemia with his seizures,  and is on Klor-Con replacement.  He has been seen at [facility] for this and  thought to be related also to his Topamax treatment.  He was last in this  hospital October 5 through June 28, 2002 for seizures associated with  bronchitis.  Since that time, he has had three single seizures without  status.  On the morning of admission, at about 3:00 a.m., he had three  seizures.  One stopped with the use of the vagal nerve stimulator, and two  other seizures left body focal, which had resolved by the time he had  arrived to the emergency room.  He was treated with 65 mg of phenobarbital  and 2 mg of Ativan, but he felt as if he was going to have recurrent  seizures, and it was felt best to admit him for observation.  He does have  recurrent seizures at least one-third of the time after being given the  therapy.   MEDICATIONS AT THE TIME OF ADMISSION:  Tegretol 250 mg three times per day,  Topamax 125 mg three times per day, Keppra 1250 mg b.i.d., Synthroid  0.75 mg  q.a.m., Klor-Con 20 mEq q.i.d., primidone 250 mg t.i.d.   PAST MEDICAL HISTORY:  He has implants, he has a [name], and he has a  vagal nerve stimulator.   ALLERGIES:  He has an allergy to Dilantin and Depakote.   FAMILY HISTORY:  Reveals that his parents are healthy.  He has a maternal  grandparent who has diabetes mellitus and hypertension.  There is a maternal  grandparent who has had a mitral valve replacement, and a maternal  grandfather with diabetes and hypertension.   SOCIAL HISTORY:  He lives at home with his parents, and has a sister 24  years of age.  He is in the 6th grade at the [employer] in  [place].  His pediatrician is Anna Genre. Little, M.D.   PHYSICAL EXAMINATION:  GENERAL:  Well-developed, thin, white male who was  somewhat groggy following the use of the phenobarbital.  VITAL SIGNS:  His lying blood pressure in the right arm was 90/60 and heart  rate was 84.  He was afebrile.  NECK:  Supple.  There was no lymphadenopathy.  MENTAL STATUS:  He was alert and he was oriented.  He followed commands.  He  was somewhat ataxic.  His cranial nerve examination revealed bilateral  ptosis.  Visual fields are full.  Some nystagmus.  Extraocular movements are  full.  Tongue is midline.  Uvula is midline.  Gag is present.  Low volume  voice.  Motor examination revealed diffuse weakness of __________  shaped  hands.  Absent deep tendon reflexes.  Sensory examination was hard to  evaluate with two-point discrimination.  Did feel the pinprick in all  extremities.  LUNGS:  Clear.  HEART:  Examination of the heart revealed a loud systolic murmur at the  lower left sternal border and apex, though to represent a mitral murmur.  ABDOMEN:  There was no enlargement of liver, spleen, or kidneys.   IMPRESSION:  1. Breakthrough complex partial seizure, code 345.41.  2. History of secondary generalized major motor seizures, code 345.10.  3. Developmental delay, code  783.4.  4. Ventricular septal defect versus mitral valve prolapse with mitral     murmur, code 424.0.  5. Brown syndrome.   PLAN:  The plan at this time is to admit the patient for observation, obtain  a basic metabolic panel and CBC.                                               Genene Churn. Sandria Manly, M.D.    [facility]  D:  08/14/2002  T:  08/14/2002  Job:  [id number]

## 2011-02-07 NOTE — Discharge Summary (Signed)
[facility]  Patient:    [name], [name] Visit Number: [id number] MRN: [id number]          Service Type: MED Location: PEDS 6151 01 Attending Physician:  Lesly Dukes Dictated by:   Marlan Palau, M.D. Admit Date:  09/03/2001 Disc. Date: 09/04/01   CC:         [facility], [address]   Discharge Summary  ADMISSION DIAGNOSES: 1. History of intractable seizure disorder with recent recurrence. 2. Hypothyroidism.  DISCHARGE DIAGNOSES: 1. History of intractable seizure disorder with recent recurrence. 2. Hypothyroidism.  PROCEDURES ON THIS ADMISSION:  None.  COMPLICATIONS:  None.  HISTORY OF PRESENT ILLNESS:  The patient is a 25 year old white male born [birth year] with a history of intractable seizures, growth delay.  The patient has been treated with three separate anticonvulsive medications in the recent past and has had a vagal nerve stimulator placed as well.  This patient has episodes of seizures that come in flurries and often require admission.  This patient had 12 seizures on the day of this admission and received 0.2 mg per kg of intravenous Ativan and also received 55 mg of intravenous phenobarbital. This patient has had significant drowsiness from this medication but was admitted for observation.  PAST MEDICAL HISTORY:  The past medical history is significant for: 1. History of intractable seizure disorder with recent recurrence. 2. Mild mental retardation. 3. Panhypopituitarism. 4. Hypothyroidism. 5. Vagal nerve stimulator placement growth delay.  MEDICATIONS PRIOR TO ADMISSION:  Tegretol 150 mg in the morning, 200 mg at noon, 250 mg in evening; Topamax 125 mg three times daily, Keppra 1250 mg twice q.d., Synthroid 0.075 mg daily.  ALLERGIES:  DYE, VALPROIC ACID.  Please refer to the history and physical dictation summary for social history, family history, review of systems,  physical examination.  LABORATORY DATA:  Notable for sodium 138, potassium 3.0, chloride 111, BUN 12, glucose 115, hemoglobin 11, hematocrit 33, carbamazepine level 11.3, magnesium 1.95, phosphorus 4.5, calcium 9.0.  HOSPITAL COURSE:  This patient has done well during the course of hospitalization.  The patient did receive Ativan 0.2 mg per kg intravenous and phenobarbital 55 mg.  The patient was drowsy throughout the night and remains slightly drowsy this morning but otherwise is alert, able to eat, taking medications.  This patient has not had any further seizures since his admission.  The patient moves all four extremities, has fair finger to nose-finger, extraocular movements are full, speech is well enunciated.  At this time the patient will be discharged to home.  MEDICATIONS: 1. Tegretol 150 mg q.a.m., 200 mg at noon, 250 mg in evening. 2. Topamax 125 mg one 3 times a day. 3. Keppra 1250 mg one b.i.d. 4. Synthroid 0.075 mg q.d. 5. Patient will receive an extra 40 mEq potassium prior to discharge. Patient    did receive some potassium in the emergency room. The potassium level is    3.5 on day of discharge.  FOLLOW UP:  Patient is to have follow up with Dr. Sharene Skeans through the [facility]. Dictated by:   Marlan Palau, M.D. Attending Physician:  Lesly Dukes DD:  09/04/01 TD:  09/04/01 Job: [id number]

## 2011-02-07 NOTE — Consult Note (Signed)
NAME:  [name], [name]               ACCOUNT NO.:  [id number]   MEDICAL RECORD NO.:  [id number]          PATIENT TYPE:  INP   LOCATION:  6126                         FACILITY:  [facility]   PHYSICIAN:  Casimiro Needle L. Reynolds, M.D.DATE OF BIRTH:  05/16/1986   DATE OF CONSULTATION:  08/05/2006  DATE OF DISCHARGE:                                   CONSULTATION   CHIEF COMPLAINT:  Fever.   HPI:  An 25 year old male, patient of Dr. Sharene Skeans, who is very well known  to our service.  He has a long history of refractory epilepsy, on multiple  medications and vagal nerve stimulator.  He has had a [name] placed for  acute seizure medication administration.  The patient was last admitted in  early July with a fever and seizures.  At that time had growth through a  central line which grew out a coag negative staph, for which he was treated  with 10 days of levofloxacin.  He has been doing pretty well since that  time.  His family reports he had one self-limited seizure yesterday.  This  morning, he had his [name] flushed routinely and after that the patient  had a fever at about 2 p.m.  He has also had an increasing cough over the  course of the day and has not seemed well.  His family became concern about  this and brought him to the emergency department.  He has not had a seizure  today.  He has had some nasal congestion for about the last 3 days, but  denies any dysuria, any abdominal pain, diarrhea, productive cough, sore  throat or other symptoms.   PAST MEDICAL HISTORY:  As above, is refractory epilepsy, along with mental  retardation and growth disease.  He also has a history of hypothyroidism and  hypopituitarism.   SOCIAL HISTORY:  He lives with his parents and sister and he is presently  attending school.  His mother is receiving chemotherapy treatments at this  time.   MEDICATIONS:  1. Keppra 250 mg b.i.d.  2. Primidone 150 mg q.a.m., 250 mg q.h.s.  3. Tegretol in divided  doses.  4. Topamax 150 mg b.i.d.  5. Levothyroxine.  6. Clarinex.  7. Potassium.   REVIEW OF SYSTEMS:  Per HPI and admission nursing record.   PHYSICAL EXAMINATION:  VITAL SIGNS:  Temperature 106.  Blood pressure  118/74.  Pulse 125.  Respirations 22.  GENERAL EXAMINATION:  This is a somewhat ill-appearing male, appearing  smaller than his stated age, supine in no evident distress.  He feels warm  to the touch.  HEENT:  Head:  Cranium is normocephalic and atraumatic.  Oropharynx is  benign.  NECK:  Supple without carotid bruits.  HEART:  Regular rate and rhythm without murmurs.  NEUROLOGIC EXAM:  Mental status:  He is awake and alert and is able to  follow some simple commands and respond appropriately to questions.  Cranial  nerves:  Pupils are equal and reactive.  Extraocular movements full without  nystagmus.  Face, tongue and palate move normally and symmetrically.  Motor:  Normal  bulk and tone.  Normal strength in all tested extremity muscles.  Sensation intact to light touch in all extremities.  Reflexes are symmetric.  Toes are downgoing bilaterally.   LABORATORY REVIEW:  CBC:  White count 5.1, hemoglobin 13.1, platelets  295,000.  Chest x-ray reveals no acute findings.   IMPRESSION:  1. Fever, question line infection.  2. Refractory epilepsy.  He has not had any breakthrough seizures yet, but      if he does have breakthrough seizures, these will need to be managed by      medications through a line which could possibly be infected.   PLAN:  After discussion with the attending physician in the pediatric  emergency room, we will likely admit him for observation.  He has had a  blood culture drawn tonight, which will be followed.  We will need to treat  his fever aggressively and possibly place him on empiric antibiotics.  We  will notify Dr. Sharene Skeans of his admission in the morning.      Michael L. Thad Ranger, M.D.  Electronically Signed     MLR/MEDQ  D:   08/05/2006  T:  08/06/2006  Job:  [id number]

## 2011-02-07 NOTE — H&P (Signed)
[place]. [facility]  Patient:    [name], [name] Visit Number: [id number] MRN: [id number]          Service Type: MED Location: PEDS 6150 01 Attending Physician:  Lesly Dukes Dictated by:   Marlan Palau, M.D. Admit Date:  08/18/2001   CC:         [facility], [address].   History and Physical  HISTORY OF PRESENT ILLNESS:  [name] is a 25 year old, right-handed, white male born 1986-07-22, with a history of intractable seizures. This patient has had a vagal nerve stimulator placed which has offered some benefit, but has not completely controlled his seizures.  The patient tends to have clusters of seizures.  He was in school on a field trip today when he began having generalized-type seizures with a focal onset.  The seizure events lasted greater than 25 minutes or so and the patient was brought to the emergency room for an evaluation.  The patient has had three or four brief focal seizures involving the right arm predominantly since arriving in the emergency room.  The patient has received 2 mg of IV Ativan and 30 mg of IV phenobarbital.  Neurology was asked to see this patient for further evaluation.  The carbamazepine level is 7.7.  PAST MEDICAL HISTORY:  Significant for: 1. History of intractable seizure disorder with recent recurrence. 2. Mild mental retardation. 3. Panhypopituitarism with growth delay. 4. History of vagal nerve stimulator placement in May of 2002. 5. History of hypothyroidism.  MEDICATIONS AT THIS TIME: 1. Topamax 125 mg t.i.d. 2. Keppra 1250 mg b.i.d. 3. Tegretol 150 mg in the morning, 200 mg midday, and 250 mg in the evening. 4. Synthroid 0.075 mg a day.  ALLERGIES:  The patient has allergies to DILANTIN and DEPAKOTE.  HABITS:  The patient does not smoke or drink.  SOCIAL HISTORY:  The patient lives at home with his parents.  The patient is in school.  He has one  sister.  FAMILY MEDICAL HISTORY:  Notable for the fact that there is no significant history of seizures in the family.  The patients sister has a history of migraines.  REVIEW OF SYSTEMS:  Notable for problems with the seizures as above.  The patient did not a slight headache earlier today.  Denies shortness of breath. He has had a cough for three to five days.  No fevers.  The cough is nonproductive.  The patient notes some nausea.  No vomiting.  His appetite has otherwise been good.  PHYSICAL EXAMINATION:  The blood pressure is 100/50, heart rate 114, respiratory rate 32, and temperature afebrile.  GENERAL APPEARANCE:  This patient is a small for age white male who is sleepy at the time of this evaluation.  HEENT:  Head:  Atraumatic.  Eyes:  The pupils are equal, round, and reactive to light.  The disks appear to be flat bilaterally.  NECK:  Supple.  RESPIRATORY:  Clear to auscultation and percussion.  CARDIOVASCULAR:  A regular rate and rhythm without obvious murmurs or rubs noted.  ABDOMEN:  Positive bowel sounds.  No organomegaly or tenderness noted.  EXTREMITIES:  Without significant edema.  NEUROLOGIC:  Cranial nerves as above.  Facial asymmetry is present.  The patient notes good sensation of the face to pinprick and soft touch bilaterally.  Visual fields are full to double simultaneous stimulation.  The patient is able to perform finger-nose-finger and toe-to-finger bilaterally. The deep  tendon reflexes remain symmetric and normal.  Toes are neutral bilaterally.  The patient was not ambulated.  No pronator drift was seen.  LABORATORY VALUES:  Notable for urinalysis with a specific gravity of 1.011, pH 7.0, and otherwise negative.  The tegretol level was 7.7.  The CBC and electrolyte profile is pending at this time.  The patient has already received a total of 2 mg of Ativan and 30 mg of phenobarbital.  IMPRESSION: 1. History of intractable seizures. 2. History  of panhypopituitarism and growth delay. 3. Status post vagal nerve stimulator placement.  This patient is currently sleepy, but will cooperate.  The family claims that after prolonged seizures, the patient often times has a significant number of small less prolonged seizures afterwards.  We will watch the patient over the next 24 hours to determine how he does.  Will need to treat accordingly.  PLAN: 1. Admission to [facility]. [facility]. 2. Given another 30 mg of IV phenobarbital now. 3. Phenobarbital and Ativan if the patient has recurring seizure. 4. Will watch the patient closely while in house. 5. If the CBC showed an elevated white count, may consider a chest x-ray. 6. Will continue the oral medications during this admission. Dictated by:   Marlan Palau, M.D. Attending Physician:  Lesly Dukes DD:  08/18/01 TD:  08/18/01 Job: [id number]

## 2011-02-07 NOTE — Discharge Summary (Signed)
[place]. [facility]  Patient:    [name], FURIA Visit Number: [id number] MRN: [id number]          Service Type: PED Location: PEDS 6150 01 Attending Physician:  Erich Montane Dictated by:   Genene Churn. Love, M.D. Admit Date:  09/20/2001 Disc. Date: 09/21/01   CC:         Fonnie Mu, M.D.   Discharge Summary  DATE OF BIRTH:  [birth year]  REASON FOR ADMISSION:  This is one of multiple [facility] [employer] admissions for this 25 year old right-handed white male with a history of intractable seizures, growth retardation, and recent vagal nerve stimulator implantation. He has intractable seizures treated with three-drug therapy and was admitted in transfer from [facility] ER to [facility] ER for complex partial seizure.  HISTORY OF PRESENT ILLNESS:  [name] has been on almost all medications known for treatment of seizures and has recently been on three-drug therapy with Trileptal, Tegretol, and Keppra.  He has had a vagal nerve stimulator placed. The day of admission he had a prolonged intractable seizure when he slumped in the backseat of the car and was unconscious with intermittent left arm and leg twitching for as long as 25 minutes.  Five minutes into the seizure he received 10 mg of Diastat per rectum.  He was taken to the [facility] emergency room where his seizure stopped, and he was not treated with any medications initially.  In conversation with the ER doctor it was decided to treat him with phenobarbital and IV Ativan, and to bring him by ambulance to [facility].  There he was treated with his same medications.  It was noted that his blood work showed evidence of a low serum potassium which is not uncommon for him and he was started on potassium supplement.  PAST MEDICAL HISTORY:  Significant for intractable seizures, mental retardation, panhypopituitarism, hypothyroidism, growth delay, and a vagal nerve  stimulator.  MEDICATIONS: 1. Synthroid 0.075 mg q.d. 2. Tegretol 150 mg in the morning, 200 mg at noon, 250 mg in the evening. 3. Keppra 1250 mg b.i.d. 4. Topamax 125 mg t.i.d.  ALLERGIES:  IVP DYE and DEPAKOTE and may have increased seizure activity when taking DILANTIN.  PHYSICAL EXAMINATION AT THE TIME OF ADMISSION:  Nonfocal.  VITAL SIGNS:  Temperature 99.8 degrees, blood pressure right and left arm was 110/60, respiratory rate 18.  NEUROLOGIC:  His tympanic membranes were clear.  He was lethargic but would follow commands.  He had bilateral ptosis, absent reflexes in the upper and lower extremities, and downgoing plantar responses.  HOSPITAL COURSE:  The patient had no recurrent seizures in the hospital.  He tolerated the Trileptal well.  His low potassium was treated with supplementation.  IMPRESSION: 1. Intractable complex partial seizures, code 345.41. 2. Mental retardation, code 317. 3. Growth delay, code 783.4. 4. Panhypopituitarism, code 353.2. 5. Hypothyroidism, code 244.9. 6. Vagal nerve stimulator. 7. Hypokalemia, code 276.8.  DISPOSITION:  Plan at this time is to resupplement his potassium with 10 mEq t.i.d. to q.i.d. for two days and to add Trileptal to his above regimen.  He will have CBC and comprehensive metabolic panel in approximately three weeks. Dictated by:   Genene Churn. Love, M.D. Attending Physician:  Erich Montane DD:  09/21/01 TD:  09/21/01 Job: [id number]

## 2011-02-07 NOTE — H&P (Signed)
[place]. [facility]  Patient:    Marcus, Perez Visit Number: [id number] MRN: [id number]          Service Type: MED Location: PEDS [phone] Attending Physician:  Mick Sell Dictated by:   Marlan Palau, M.D. Admit Date:  12/13/2001 Discharge Date: 12/14/2001   CC:         Fonnie Mu, M.D.   History and Physical  HISTORY OF PRESENT ILLNESS:  [name] is a 25 year old right-handed white male -- born [birth year] -- with a history of intractable seizures that are partial-complex-type events.  This patient comes into the emergency room tonight with recurring seizure event lasting about 25 minutes in duration, associated with altered mental status and urinary incontinence x2. The patients last prolonged seizure event was two weeks ago where he fell suddenly, hit his head with the fall.  This patient has had more brief seizures throughout the week that are well-controlled by swiping the vagal nerve stimulator.  The patient is brought in for further evaluation tonight. This patient has been switched back to Tegretol and currently is on 200 mg three times a day, Tegretol level 11.1 at this time.  PAST MEDICAL HISTORY: 1. History of intractable partial complex seizures. 2. Panhypopituitarism. 3. Failure to thrive. 4. Mild mental retardation. 5. Status post vagal nerve stimulator placement. 6. Hypokalemia.  MEDICATIONS: 1. Keppra 1250 mg b.i.d. 2. Topamax 125 mg t.i.d. 3. Tegretol 200 mg t.i.d. 4. Synthroid 0.075 mg daily. 5. Potassium chloride 20 mEq t.i.d.  ALLERGIES:  The patient has allergies to DILANTIN and VALPROIC ACID.  SOCIAL HISTORY:  The patient currently lives at home with his mother and father.  The patient has an older sister as well.  FAMILY MEDICAL HISTORY:  Notable that mother had a pulmonary embolus in the past, is status post hysterectomy; sister has history of migraine.  REVIEW OF SYSTEMS:   Notable for no recent fevers or chills.  Patient has slight cough, some allergy symptoms.  Denies chest pain, nausea, vomiting, weakness of the extremities.  PHYSICAL EXAMINATION:  VITAL:  Blood pressure currently is 87/66.  Heart rate is 86.  Respiratory rate 20.  Temperature:  Afebrile.  GENERAL:  This patient is a well-developed, small-for-age white male who is alert, cooperative and talkative at the time of examination.  HEENT:  Head is atraumatic.  Eyes:  Pupils are equal, round and react to light.  Disks are flat bilaterally.  NECK:  Supple.  RESPIRATORY:  Clear.  CARDIOVASCULAR:  Regular rate and rhythm.  No obvious murmurs or rubs noted. Extremities are without significant edema.  ABDOMEN:  Positive bowel sounds.  No organomegaly or tenderness.  NEUROLOGIC:  The patient has symmetric reflexes throughout.  Toes are neutral. The patient has good strength in all fours, has fair finger-to-nose-to-finger and toe-to-finger bilaterally.  The patient is not ambulated.  The patient has symmetric soft touch sensation throughout.  LABORATORY AND ACCESSORY DATA:  Laboratory values are notable for a white count of 3.8, hemoglobin 10.7, hematocrit 30.7 and platelets of 254,000; Tegretol level of 11.1.  Comprehensive metabolic profile is pending at this time.  IMPRESSION: 1. History of intractable partial complex seizures, prolonged seizure tonight. 2. Developmental delay, growth delay. 3. History of mitral valve prolapse. 4. Hypokalemia. 5. Hypercholesterolemia. 6. Status post vagal nerve stimulator.  This patient is admitted at this point for further evaluation and management of his seizures.  The patient seems to be doing fairly well  at this point, is on triple-drug therapy and is not fully controlled at this point.   PLAN: 1. Admission to [facility]. 2. Admission blood work. 3. We will follow the patients clinical course.  The patient has already    gotten 2 mg of IV Ativan  and 60 mg of IV phenobarbital.  We will watch over    the next 12 to 24 hours. Dictated by:   Marlan Palau, M.D. Attending Physician:  Mick Sell DD:  12/29/01 TD:  12/29/01 Job: [id number]

## 2011-02-07 NOTE — Discharge Summary (Signed)
NAME:  [name], [name]               ACCOUNT NO.:  [id number]   MEDICAL RECORD NO.:  [id number]          PATIENT TYPE:  INP   LOCATION:  6150                         FACILITY:  [facility]   PHYSICIAN:  Henrietta Hoover, MD    DATE OF BIRTH:  [birth year]   DATE OF ADMISSION:  10/28/2004  DATE OF DISCHARGE:  11/01/2004                                 DISCHARGE SUMMARY   REASON FOR HOSPITALIZATION:  Seizures, fever.   SIGNIFICANT FINDINGS:  [name] is a 25 year old Caucasian male admitted with  fevers and increased seizure activity; he also had transient hypotension  upon admission, diarrhea, hypokalemia, and hypophosphatemia.  A blood  culture from his [name] grew coagulase-negative Staph, peripheral  cultures -- no growth to date at discharge, urine culture -- no growth to  date.  Discharge labs include a sodium of 136, potassium 4.0, chloride 107,  bicarb 20, BUN 5, creatinine 0.7, glucose 110, calcium of 9.1; white count  3.7, hemoglobin 13.1, hematocrit 37.8, platelets 222,000.   TREATMENT:  Ativan, IV fluids, Tylenol, Motrin, Rocephin, Tegretol,  Primidone, Topamax, Keppra, Synthroid, K-Phos, Zosyn.   PROCEDURES:  None.   DIAGNOSES:  1.  Viral gastroenteritis.  2.  Seizure.  3.  Bacteriemia.  4.  Line infection.   DISCHARGE MEDICATIONS:  1.  Tegretol 250 mg at 7 a.m. and 1:30 p.m., 300 mg at 9 p.m.  2.  Topamax 150 mg t.i.d.  3.  Keppra 250 mg b.i.d.  4.  Synthroid 75 mcg each day.  5.  Primidone 125 mg at 7 a.m., 250 mg at 9 p.m.  6.  Neutro-Phos-K 1 packet 4 times daily.  7.  Tylenol p.r.n. for fever.  8.  Rocephin 1.7 g IV daily x10 days for a total of a 14-day course.   PENDING RESULTS:  Blood cultures and sensitivities, Parvovirus pending at  discharge.   FOLLOWUP:  Follow up next week with Dr. Murrell Redden. [place].   DISCHARGE WEIGHT:  33 kg.   DISCHARGE CONDITION:  Good.      PR/MEDQ  D:  11/01/2004  T:  11/02/2004  Job:  [id number]

## 2011-02-07 NOTE — H&P (Signed)
[employer] [place]. [facility]  Patient:    [name], [name]                      MRN: [id number] Adm. Date:  [id number] Attending:  Mick Sell CC:         Ancil Boozer, M.D. [facility]  Fonnie Mu, M.D.   History and Physical  DATE OF BIRTH:  November 04, 1985  CHIEF COMPLAINT:  Intractable seizures.  HISTORY OF PRESENT ILLNESS:  [name] is readmitted for the sixth trial of intravenous human IVIG.  This began on May 16, 2000, and continued on September 22nd, October 28th, December 8th, and October 03, 2000.  It is a two-day hospitalization with three 15 g infusions with IVIG.  We have kept a detailed record of his seizure frequency, and unfortunately it has not shown evidence of significant response to this treatment.  (See August 29, 2000, history and physical examination.)  Since his last visit, [name] has had one hospitalization on January 27th and January 28th, for a series of prolonged seizures that required three separate doses of Ativan.  The patient was extremely postictal and was unable to go home.  On October 24, 2000, he had a two-minute seizure around 1:20 a.m.  Some time this past week he required 2 mg of diazepam for three two to three-minute seizure in one day.  The seizures basically have been right brain semiology with either somatosensory or focal motor seizures on the right side, with secondary generalization.  Evaluation has been extensive and is described in the history and physical examination of June 13, 2000.  Recent electroencephalogram showed a right midtemporal greater than left sharp waves in a well-organized electroencephalogram, despite the fact that it appears that most of his seizures have been left brain in origin.  Imaging studies show subcortical and periventricular white matter hyperintensities that are consistent with calcification.  An MRI scan of the brain has shown scattered  subcortical white matter lesions in the parietal temporal and parietal occipital region, and periventricular regions.  Extensive laboratory workup, looking for inborn errors of metabolism and chromosomal disorders have been negative.  The patient has not shown signs of central nervous system inflammation, although on one lumbar puncture, shortly after a prolonged seizure, the patient has evidence of elevated white cells, which lead [place] in this direction to treat with IVIG.  Most of [name] seizures tend to come when he is asleep, although the past cluster at school shows that they also occur when he is awake.  It is interesting to note that the family has noted increased alertness in the two weeks following IVIG infusions.  [name] then seems to become more tired and run-down.  REVIEW OF SYSTEMS:  Unremarkable for significant intercurrent infections of the head, neck, lungs, GI, GU; for injury, rash, any diabetes mellitus or thyroid disease.  No new neurological symptoms.  CURRENT MEDICATIONS: 1. Tegretol 150 mg q.a.m., 200 mg q. lunch time, 250 mg q. bedtime. 2. Keppra 1000 mg q.a.m., and q. bedtimes. 3. Topamax 125 mg q.a.m., q. lunch time, q. bedtime. 4. Synthroid 0.75 mcg q.a.m. 5. Diazepam p.r.n. orally. 6. Diastat p.r.n. rectally for prolonged seizures.  ALLERGIES:  DILANTIN, a rash, AND DEPAKOTE, increasing frequency of seizures and intractable vomiting.  FAMILY HISTORY:  Unremarkable for anyone with seizures or mental retardation.  SOCIAL HISTORY:  The patient attends school.  He is in cross-categorical classes, basically learning life skills.  He  attends middle school.  He lives with his parents and a sister.  The family is extremely supportive of him.  PHYSICAL EXAMINATION:  GENERAL:  This is an acute somewhat dysmorphic blonde-haired child, who is small stature and weight with a beak nose.  HEENT:  No signs of infection.  LUNGS:  Clear to auscultation.  HEART:   Shows a systolic murmur at the left sternal border, VSD.  Pulses are normal.  ABDOMEN:  Soft, nontender.  Bowel sounds normal.  EXTREMITIES:  Thin, no dystonia, edema, cyanosis, or deformity.  NEUROLOGIC:  Awake, alert.  He quickly follows commands (different from last visit).  Pupils equal, round, reactive.  Visual fields full.  Fundi normal. Symmetrical facial strength.  Midline tongue and uvula.  Air conduction greater than bone conduction bilaterally.  Motor examination:  Normal strength, tone, and mass.  Good fine motor movements.  No pronator drift.  Sensation intact to cold, vibration, stereognosis.  Gait was not tested.  (He was being hooked up to his IVIG infusion.)  Deep tendon reflexes absent.  He had bilateral flexor plantar responses.  IMPRESSION: 1. Intractable complex partial, simple partial seizures, with secondary    generalization - code #345.41, #345.51, #345.11. 2. Hypopituitarism. 3. Dysmorphic features. 4. Mild mental retardation - code #317.  PLAN:  This will be IVIG infusion number six, and will proceed with 15 g, given over a period of three days at about 12-hour intervals.  He will continue with his antiepileptic medicines.  We will explore the next step in his treatment which will probably be a vagal nerve stimulation with Dr. Ancil Boozer.  He will need a prolonged video electroencephalogram at [facility] for that.  The patient will be in the hospital until he has completed his treatment.  He will have a brief pass to attend a function with his family, and will return to the hospital in time for his second treatment. DD:  11/07/00 TD:  11/07/00 Job: [id number]

## 2011-02-07 NOTE — Discharge Summary (Signed)
[place]. [facility]  Patient:    [name], [name]                      MRN: [id number] Adm. Date:  [id number] Disc. Date: [id number] Attending:  Lorain Childes B CC:         Dr. Sabino Snipes, [facility]   Discharge Summary  FINAL DIAGNOSES: 1. 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 2. Intractable complex partial seizures; code 345.41. 3. Growth delay. 4. Pan hypopituitarism. The etiology of these is unknown.  PROCEDURES:  None.  SUMMARY OF HOSPITALIZATION:  The patient is a 25 year old patient well-known to me who has had frequent seizures since age 23 when he was started on growth hormone for pan hypopituitarism.  Over time, [name] has been on a variety of medications which have either not controlled his seizures or have had unacceptable side effects.  His current medications include Keppra, Tegretol, and Topamax and Synthroid. He also receives IVIG infusion 15 g every 12 hours once a month in an attempt to decrease perceived CNS inflammation recognized at [facility]. He has had a very thorough metabolic work-up which has not shown evidence of mitochondrial encephalopathy despite the fact that he has basal ganglia calcifications and some other systems disease that might suggest this process.  The patient was admitted to the hospital with a 12-hour period of clusters of seizures, 10, in the late evening or early morning hours, of July 27, 2000, and July 28, 2000, and another 15 on the early morning hours of July 28, 2000.  He was brought to the emergency room and treated the first time with 2 mg of Ativan and sent home.  He then was brought back to the hospital when symptoms recurred and received 2 mg in the emergency department and then another milligram on the floor.  The patient was predictably postictal and also drowsy from the medication. The decision was made to increase his Topamax when  Tegretol levels came back at 9.2 mcg/mL at trough.  The patient has had no seizures since 11 a.m. yesterday.  He has gradually become more awake. He had good appetite this morning despite increasing his Topamax which in the past has decreased his appetite.  His examination this morning shows temperature of 97.3, pulse 120, respiratory rate 24.  The patient is somewhat sleepy.  His lungs are clear.  Heart has no murmurs. Pulses are normal.  Abdomen is soft.  Bowel sounds are normal.  Extremities are thin and he is overall small.  He has a beaklike nose, blond hair and blue eyes.  Neurologic examination reveals him to be sleepy but arousable, following all commands, speaking with mild slurred speech.  Cranial nerves show round reactive pupils. Visual fields full.  Extraocular movements are full and conjugate.  His eyelids seem droopy today.  Symmetric facial strength.  Midline tongue and uvula.  Air conduction greater than bone conduction.  Motor examination shows normal strength without drift.  Good fine motor movements.  Sensation intact to noxious stimuli.  Good stereognosis. Gait was not tested today.  Deep tendon reflexes are and have been absent. He had bilateral flexor plantar responses.  DISCHARGE MEDICATIONS:  The patient will be discharged home on the following medications. 1. Keppra 1000 mg twice a day. 2. Tegretol 100 mg tablets one and a half in the morning, two at midday,    two and a half at nighttime. 3. Topamax has been  increased to 100 mg three times a day. 4. Synthroid 0.075 mg per day.  ACTIVITY:  He will return to school tomorrow if he is able to do so.  DISCHARGE INSTRUCTIONS:  His parents will contact me about the side effects of Topamax which include sedation and anorexia.  We will continue to give [name] IVIG treatments on a monthly basis at least for the next few months and if this is not working, have to consider other treatments.  It should be noted, the  patient is having occasional PACs and PGCs on his rhythm strip; however, this is not a significant problem.  We will monitor this on future visits which will take place next time in three weeks or sooner if he has problems.  DISCHARGE CONDITION:  He is discharged home in improved condition. DD:  07/29/00 TD:  07/29/00 Job: [id number]

## 2011-02-07 NOTE — H&P (Signed)
NAME:  TRENTON, PASSOW NO.:  [id number]   MEDICAL RECORD NO.:  [id number]          PATIENT TYPE:  EMS   LOCATION:  MAJO                         FACILITY:  [facility]   PHYSICIAN:  Gustavus Messing. Orlin Hilding, M.D.DATE OF BIRTH:  Sep 25, 1985   DATE OF ADMISSION:  01/01/2006  DATE OF DISCHARGE:                                HISTORY & PHYSICAL   CHIEF COMPLAINT:  Seizure.   HISTORY OF PRESENT ILLNESS:  [name], [name] is an 25 year old  right-handed white male with a long history of intractable seizures of  multiple types, chiefly simple and complex partial with occasional secondary  generalization.  He has growth retardation and developmental delay, with  frequent admissions to [facility] for refractory seizures.  He just had  his vagus nerve stimulator battery replaced last month, and Dr. Sharene Skeans has  been gradually ramping that up and just made an adjustment two days ago.  This morning at school (he goes to [employer]) he collapsed at his  desk and then had a seizure with crawling motions like he was trying to get  away.  These seizures have been responsive to vagus nerve stimulator in the  past, but the patient did not have his magnet with him.  He continued to  have a brief flurry of seizures.  He also has hemisensory seizures on the  left which do not respond to vagus nerve stimulation.  The patient was  transported to [facility] emergency room where he received phenobarbital 65 mg and  Ativan 2 mg via his [name] according to protocol which is specific for  him.  He remains confused and jittery.   REVIEW OF SYSTEMS:  Unobtainable.   PAST MEDICAL HISTORY:  1.  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  2.  Developmental delay.  3.  Hypothyroidism.  4.  Growth hormone deficiency.  5.  Ventricular septal defect.  6.  Brown syndrome with eye movement abnormalities, fine motor and      coordination and mental  retardation without any clear underlying      etiology.   MEDICATIONS:  1.  Tegretol 250 mg at 7 a.m. and 1:30 p.m. and 300 mg at 9 p.m.  2.  Topamax 150 mg at 7 a.m., 1:30 p.m., and 9 p.m.  3.  Keppra 1250 at 7 a.m. and 9 p.m.  4.  Primidone 125 mg at 7 a.m., 250 mg at 9 p.m.  5.  Synthroid 75 mcg at 7 a.m.  6.  Clarinex 5 mg at 9 p.m.  7.  Klor-Conn 20 mEq plus K-Phos 500 mg at 7 a.m., 1:30 p.m., 5 p.m., and 9      p.m.   ALLERGIES:  DILANTIN WHICH PRODUCES SEIZURES, DEPAKOTE WHICH CAUSES SYSTEMIC  ORGAN DYSFUNCTION, ANTIHISTAMINES POSSIBLY INCREASE HIS SEIZURES.   SOCIAL HISTORY:  He attends [employer] with slow progress.  He has a  Information systems manager who helps him.  He lives with his parents and sister.   FAMILY HISTORY:  Unremarkable.   PHYSICAL EXAMINATION:  VITAL SIGNS:  At his last visit, he weighed 86.25  pounds on November 26, 2005,  GENERAL:  He is a thin, pale male with indelicate features.  HEENT:  Head is normocephalic, atraumatic.  NECK:  Supple, without bruits.  HEART:  Regular rate and rhythm.  ABDOMEN:  Positive bowel sounds and nontender.  NEUROLOGIC:  He is somnolent with a somewhat stuttering speech.  He is  oriented to hospital.  Cranial nerves:  His pupils are equal and reactive.  Extraocular movements are intact.  There is no facial asymmetry.  Tongue is  midline.  Motor:  He is moving all four extremities.  He grips somewhat  better on the right than the left at present.  He has brisk reflexes.   LABORATORY DATA:  Pending.   IMPRESSION:  Seizure breakthrough in an 25 year old with growth and mental  delay, intractable complex partial seizure disorder, with a four drug  regimen, vagal nerve stimulator, and central catheter for as needed  medications with frequent admissions for status epilepticus.   PLAN:  Admit to pediatrics with Dr. Sharene Skeans attending for observation,  monitoring, and further treatment with Ativan and phenobarbital if needed.  Discussed  with Dr. Sharene Skeans.  Discussed with pediatric admitting officer.  Will check drug levels and check a blood culture and urinalysis.      Catherine A. Orlin Hilding, M.D.  Electronically Signed     CAW/MEDQ  D:  01/01/2006  T:  01/01/2006  Job:  [id number]   cc:   Deanna Artis. Sharene Skeans, M.D.  Fax: [phone]

## 2011-02-07 NOTE — Discharge Summary (Signed)
NAME:  SHAFT, CORIGLIANO NO.:  [id number]   MEDICAL RECORD NO.:  [id number]          PATIENT TYPE:  INP   LOCATION:  6151                         FACILITY:  [facility]   PHYSICIAN:  Deanna Artis. Hickling, M.D.DATE OF BIRTH:  [birth year]   DATE OF ADMISSION:  01/01/2006  DATE OF DISCHARGE:  01/02/2006                                 DISCHARGE SUMMARY   FINAL DIAGNOSES:  1.  Intractable simple, and complex partial seizures with secondary      generalization.  2.  Developmental delay.  3.  Hypothyroidism.  4.  Growth hormone deficiency.  5.  Ventricular septal defect.  6.  Brown syndrome with eye movement abnormalities, fine motor      incoordination, mental retardation without clear-cut etiology.   PROCEDURES:  None.   HOSPITAL COURSE:  The patient was admitted with recurrent simple partial  seizures with secondary generalization.  He collapsed at his desk and had  crawling motions [place] if he was trying to get away from others.  This would  suggest a frontal lobe focus.  In the past, these have been responsive to  stimulation of his vagal nerve stimulator but since it was recently changed,  it is not sufficient voltage to be able to affect the seizures.   The patient was transported to the [facility] emergency room where he  responded nicely to phenobarbital 65 mg, Ativan 2 mg IV.  The patient had  low grade fever.  As result of this, a culture of his central catheter was  made.   PHYSICAL EXAMINATION:  VITAL SIGNS:  On examination today, temperature 36.9,  resting pulse 98, respirations 19, blood pressure 90/57, his pulse oximetry  96%.  GENERAL:  Normal except for except for his VSD.  NEUROLOGIC:  Shows essentially normal strength, diplegia gait and mildly  disconjugate eye movements.  He has mild mental retardation.   PLAN:  The patient was discharged home on his current medications which  include.  1.  Tegretol 250 mg at 7:00 a.m. and 1:30 p.m. and 300 mg at 9:00  p.m.  2.  Topamax 150 mg at 7:00 a.m., 1:30 p.m., 9:00 p.m.  3.  Keppra 1250 mg at 7:00 a.m., 9:00 p.m.  4.  Primidone 125 mg at 7:00 a.m., 150 mg at 9:00 p.m.  5.  Synthroid 75 mcg at 7:00 a.m.  6.  Clarinex 5 mg at 9:00 p.m.  7.  Klor-Con 20 mEq and K-Phos 500 mg at 7:00 a.m., 1:30 p.m., 5:00 p.m. and      9:00 p.m.   He will be discharged home today once his culture results are known.  This  has been discussed with his father who is in agreement.      Deanna Artis. Sharene Skeans, M.D.  Electronically Signed     WHH/MEDQ  D:  01/02/2006  T:  01/02/2006  Job:  [id number]

## 2011-02-07 NOTE — Discharge Summary (Signed)
[place]. [facility]  Patient:    [name], [name]                        MRN: [id number] Adm. Date:  11/07/00 Disc. Date: 11/08/00 Attending:  Deanna Artis. Sharene Skeans, M.D. CC:         Fonnie Mu, M.D.             Ancil Boozer, M.D., [facility]                           Discharge Summary  DIAGNOSES: 1. Intractable seizure disorder, simple partial, 345.51, complex partial,    345.41, with secondary generalization 345.11. 2. 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 3. Mild mental retardation, 317.  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  We have seen no change in the frequency of his seizures nor other change in the frequency for need of oral diazepam, rectal Diastat, ER visits, or hospitalizations.  As a result of this, we will likely look for another means to treat the patients intractable seizure disorder and will discuss this at length with Dr. Ancil Boozer at [facility].  The patient tolerated the three IV ING infusions without complication, and he is ready to go home.  PHYSICAL EXAMINATION:  VITAL SIGNS:  Blood pressure 110/50, resting pulse 80, respirations 20, temperature 97.0.  HEENT:  No signs of infection.  LUNGS:  Clear.  HEART:  Holosystolic murmur at the left sternal border.  ABDOMEN:  Soft, bowel sounds normal.  EXTREMITIES:  Well formed.  NEUROLOGIC:  Mental status:  The patient was awake, alert, following commands, visual fields full, round and reactive pupils, symmetric facial strength, midline tongue.  Motor examination:  Normal strength, tone, and mass.  Good fine motor movements.  No pronator drift.  Gait and station were slightly broad-based but otherwise at his baseline.  Deep tendon reflexes were absent.  The patient is doing well and will go home on the following  medications.  DISCHARGE MEDICATIONS: 1. Keppra 1000 mg twice a day. 2. Tegretol 150 mg in the morning, 200 at midday, 250 at nighttime. 3. Topamax 125 mg 3 times a day. 4. Synthroid 0.75 mcg per day. DD:  11/08/00 TD:  11/08/00 Job: [id number]

## 2011-02-07 NOTE — H&P (Signed)
[place]. [facility]  Patient:    [name], [name]                  MRN: [id number] Adm. Date:  [id number] Disc. Date: [id number] Attending:  Mick Sell                         History and Physical  PATIENTS ADDRESS:  [address] ______ [address] [place], [place] [address]  DATE OF BIRTH:  December 31, 1985  REASON FOR ADMISSION:  This 25 year old white male is admitted from the emergency room for treatment of intractable seizures.  HISTORY OF PRESENT ILLNESS:  [name] has a long history of intractable seizures and is currently being treated with three-drug therapy. He has received a vagal nerve stimulator.  He usually has a cluster of seizures about once per month.  He has both simple partial, complex partial and secondarily generalized seizures.  He last was admitted to this hospital Feb 05, 2001, at which time he had had multiple seizures requiring Ativan 3 mg IV per [name] and then a recurrent cluster of seizures requiring phenobarbital.  His last MRI study has shown no definite lesions and his EEG has shown evidence of right brain focal seizure activity.  The evening of admission at 12:45 a.m., he had had one single seizure treated with the vagal nerve stimulator, then about 3 a.m., he began having a cluster of seizures, the last lasting 30 minutes, involving the right arm, face and leg, treated with Ativan 3 mg in the emergency room which stopped the seizure.  He had failed to respond to vagal nerve stimulation.  CURRENT MEDICATIONS: 1. Topamax 125 mg t.i.d. 2. Tegretol 150 mg, 200 mg and 250 mg per day. 3. Keppra 1000 mg twice per day. 4. Synthroid 0.75 mg once per day. 5. He intermittently has been taking Tylenol No. 3 with codeine for pain at    the site of his vagal nerve stimulator.  ALLERGIES:  He has a known history of allergies to DILANTIN and DEPAKOTE.  MEDICAL REVIEW OF SYSTEMS:  No history of fever,  pulling at his ears, diarrhea or viral-type illness in this week prior to admission.  FAMILY HISTORY:  Unremarkable.  SOCIAL HISTORY:  He is currently in the fifth grade.  PAST MEDICAL HISTORY:  Significant for intractable simple and complex partial seizures, panhypopituitarism, mental retardation, VSD, and status post implantation of vagal nerve stimulator; he also has a [name].  PHYSICAL EXAMINATION:  GENERAL:  Examination revealed a well-developed, thin, small white male with right arm and right leg focal seizures which were treated and responded to Ativan 3 mg IV.  VITAL SIGNS:  Subsequently, his blood pressure in the right arm was 130/80. His heart rate was 110.  His temperature rectally was 99.3 degrees.  His respiratory rate was 24.  NEUROLOGIC:  He was postictal but would speak with dysarthric speech, would follow commands such as lift his right hand and arm and his right leg and tell me when he could feel vibration.  His cranial nerve examination revealed bilateral ptosis.  He could see fingers in his visual fields.  Both disks were seen and flat.  Extraocular movements were full.  Corneals were present.  The pupils reacted from 5 to 3 bilaterally.  There was no facial motor asymmetry except for the ptosis; he may have had some bifacial weakness.  Tongue was midline and bitten.  His gags  were present.  His motor examination revealed that he did move all extremities and would grip with his right hand and arm and raise his right leg when asked.  I could not pick up any focal asymmetry in his motor examination.  He had absent reflexes in the upper and lower extremities.  He did feel vibration.  He did feel pinprick.  Deep tendon reflexes were absent.  Plantar responses were downgoing.  HEENT:  His general examination revealed the tympanic membranes to be clear. His tongue had been bitten.  LUNGS:  Clear to auscultation.  HEART:  A holosystolic murmur.  ABDOMEN:   His bowel sounds were normal.  He had a thin scaphoid abdomen.  CHEST:  He had a vagal nerve implantation device in his chest; he also had a [name].  NECK:  He had a neck wound incision on the left.  IMPRESSION: 1. Intractable simple partial, complex partial and secondary generalized major    motor seizures, code 345.51, 345.41 and 345.11. 2. Mental retardation, code 318. 3. Panhypopituitarism.  PLAN:  Plan at this time is to admit the patient for IV Ativan, observation and consideration of phenobarbital should a cluster of recurrent seizures develop. DD:  03/04/01 TD:  03/04/01 Job: [id number]

## 2011-02-07 NOTE — Consult Note (Signed)
NAME:  [name], [name] NO.:  [id number]   MEDICAL RECORD NO.:  [id number]          PATIENT TYPE:  INP   LOCATION:  1823                         FACILITY:  [facility]   PHYSICIAN:  Melvyn Novas, M.D.  DATE OF BIRTH:  [birth year]   DATE OF CONSULTATION:  10/15/2004  DATE OF DISCHARGE:                                   CONSULTATION   EMERGENCY NEUROLOGICAL CONSULTATION   HISTORY OF PRESENT ILLNESS:  The patient is a 25 year old Caucasian right-  handed growth retarded and developmentally delayed young man, who has had  frequent seizures since age 39.  He has failed multiple medication regimens  and had a vagal nerve stimulator implanted.  He was then again tried on  multiple medications all of which he has failed.  His seizures are very  distinct.  He describes sensory seizures with sense of floating or a  perception that he is immersed in hot water or flying. He has also absence  seizures where he stares off, complex partial seizures with secondary  generalization are also noted.  Sometimes he seems just to have myoclonic  jerks.  The current regimen to use at home is as follows:  Tegretol 250 mg  b.i.d., 300 mg at lunch,  Topamax 150 mg t.i.d., Keppra 1,250 mg, Synthroid  75 mcg, potassium 20 mEq q.i.d., Primidone 125 mg in the morning and 250 in  the evening.  Mr. Sawa himself is too sleepy and postictally sedated from  medication and the event itself to give any answers about the progression of  his seizure earlier this morning.  His father steps in and states that he  had at least a 3 to 4 minute seizure before the family was able to give him  Ativan and then Phenobarbital intravenously for which the patient has a Port-  A-Cath access at home.  After he received his cocktail his seizure seemed to  have slowed down but 40 minutes later he suffered a second event and more  medication could not be given at home safely so he was brought to the  emergency room.  Here  he received 2X 65 mg intravenously of Phenobarbital  and a total of 6 mg of Ativan.   ALLERGIES:  The patient is allergic to DILANTIN.   PAST MEDICAL HISTORY:  Mitral valve prolapse, pulmonary valve problems,  scoliosis, hypothyroidism, seizure disorder, growth hormone deficiency and  mental retardation.   PHYSICAL EXAMINATION:  GENERAL:  The patient is sleepy but responding to non-  noxious stimuli.  He can name his first name.  He had made an attempt to  tell me his birth date but stopped at the 13th of Christmas.  He does move  all four extremities and moves then equally bilaterally.  He seems to [facility]  and his dysarthria is probably worsened by his sedative medications.  He  points out his father and his sister when asked to do so.  HEENT: His pupils react equal to light.  He has full facial movement and he  has extraocular muscle movements that are intact with minimal nystagmus.  Tongue  are uvula are midline with normal gag.  NECK:  Supple.  NEUROLOGICAL:  There is no rash, clubbing or cyanosis, no edema.  Deep  tendon reflexes are 1+, downgoing toes to plantar stimulation.  He has equal  muscle tone, strength and mass with equal grip.  He can perform dorsiflexion  and plantar flexion and has downgoing toes.  He did not perform a finger to  nose test for me as he fell asleep.   PLAN:  The plan is to admit the patient for observation and to resume the  oral seizure medications if the patient is awake enough and safe to swallow.  Ativan is ordered PRN in case new seizure activity is witnessed.  The  patient will be admitted to the pediatric service on the pediatric floor at  [facility].   ICD Code 40347, 34535.      CD/MEDQ  D:  10/15/2004  T:  10/15/2004  Job:  [id number]   cc:   Deanna Artis. Sharene Skeans, M.D.  [address]  [address]  [place]  [place] [address]  Fax: [phone]

## 2011-02-07 NOTE — H&P (Signed)
[place]. [facility]  Patient:    OMEGA, SLAGER Visit Number: [id number] MRN: [id number]          Service Type: PED Location: [phone] Attending Physician:  Devoria Albe Dictated by:   Genene Churn. Love, M.D. Admit Date:  09/20/2001                           History and Physical  PATIENT ADDRESS:  [address] [place], [place], [address].  DATE OF BIRTH:  03/17/1986  REASON FOR ADMISSION:  This is one of multiple [facility] admissions for this 25 year old right-handed white single male from [place] from [place] with intractable complex partial seizures who has been on multiple different anticonvulsant medications and has had a vagal nerve stimulator placed as well.  He usually is admitted two times per month for intractable seizures.  On this day - September 20, 2001 about noontime - he was travelling with his family in the car to visit friends in [place], [place] and slumped over in the back-seat.  He was noted to have intermittent jerking of his left hand and arm.  Five minutes into the the seizure he was given Diastat 10 mg per rectum.  Twenty-five minutes after the seizure started, the seizure did resolve.  He was at the emergency room at [facility] and there I had recommended giving him 60 mg of phenobarbital and 2 mg of Ativan IV, and he was transferred by ambulance to [facility].  MEDICATIONS: 1. Tegretol 150 mg in the morning, 200 mg at noon, 250 mg in the evening. 2. Topamax 125 mg t.i.d. 3. Keppra 1250 mg b.i.d. 4. Synthroid 0.075 mg daily.  ALLERGIES:  He has a known history of allergies to IVP DYE and to VALPROIC ACID.  FAMILY HISTORY:  Has been dictated elsewhere.  PHYSICAL EXAMINATION:  GENERAL:  Revealed a well-developed, lethargic, smaller than stated age-appearing white male.  VITAL SIGNS:  Temperature 99.8 degrees rectally, heart rate 115, blood pressure  right and left arm is 110/60, respiratory rate 18.  HEENT:  Tympanic membranes were clear.  LUNGS:  Clear to auscultation.  CARDIAC:  There was a systolic heart murmur heard.  He had a bilateral ptosis.  EXTREMITIES:  There was no cyanosis, clubbing, or edema.  NEUROLOGIC:  Mental status revealed he was alert, oriented to person, followed commands.  His visual fields were full, disks were flat.  The extraocular movements were full.  He had bilateral ptosis.  Corneals were present.  There was no seventh nerve palsy.  Tongue was midline.  He had dysarthric speech. Motor examination revealed he moved all extremities.  He had absent deep tendon reflexes and planter responses were downgoing.  He did feel pinprick in all extremities.  IMPRESSION: 1. Intractable seizures, code 345.41. 2. Mental retardation, code 317. 3. Growth delay, code 783.4. 4. 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 5. Hypothyroidism, code 244.9. 6. Vagal nerve stimulator. 7. ALLERGIES to IVP DYE and DEPAKOTE.  PLAN:  Admit the patient for further evaluation and consideration of Trileptal therapy. Dictated by:   Genene Churn. Love, M.D. Attending Physician:  Devoria Albe DD:  09/20/01 TD:  09/20/01 Job: [id number]

## 2011-02-07 NOTE — Discharge Summary (Signed)
[place]. [facility]  Patient:    [name], [name]                      MRN: [id number] Adm. Date:  [id number] Disc. Date: 10/19/00 Attending:  Mick Sell CC:         Dr. Mayer Masker, [facility]   Discharge Summary  FINAL DIAGNOSES: 1. Status epilepticus, 345.3. 2. 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 3. 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 4. Dysmorphic features. 5. Mental retardation, mild; 317.  PROCEDURES:  None.  COMPLICATIONS:  None.  SUMMARY OF HOSPITALIZATION:  [name] was admitted to the hospital with a prolonged seizure.  The patient had a seizure the night prior to admission lasting two and a half minutes.  Around 7 oclock on the morning of October 18, 2000, the patient had onset of seizure activity consisting of convulsive movements occurring every three seconds or so.  The patient had some blood from his mouth from laceration of his cheek.  He was brought into the emergency room and was treated with Ativan; however, seizures continued in the aftermath and he received a total of three separated doses of 1 mg of Ativan, two in the emergency room and another one at 9:05.  Brief seizures continued until 11:53 in the morning.  They lasted for 15 to 20 seconds and were associated with staring, drawing of his right hand and then the right hand fell to his side.  Medication was given around 8:45, around 9:05 and then around 10:34.  The patient was extremely drowsy and required hospitalization in order to obtain hydration and control him with intravenous medications until he could awaken.  CURRENT MEDICATIONS: 1. Tegretol 150 mg in the morning, 200 mg at midday, 250 at night time. 2. Topamax 100 mg three times a day. 3. Keppra 1000 mg twice a day. 4. Synthroid 0.075 mg per day. 5. The patient also receives Diastat at home.  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  Unfortunately, the frequency of his seizures has not decreased, either individual seizures or prolonged episodes requiring either emergency room visits or hospitalization.  He was seen in the emergency room on the first and the 18th of this month, hospitalized this time and had one weekend of hospitalization for his IVIG treatment.  He is scheduled to have one more treatment on October 31, 2000. Unfortunately, I will be out of the city and will have to do this on the 16th and 17th.  We also will begin discussions with [facility] to bring him in for prolonged video EEG and placement of a vagal nerve stimulator versus epilepsy surgery, depending on what is found.  In the past, he has had multifocal interictal activity that did not correlate with a particular region even though there are some areas of abnormality, particularly in the left side of his brain that would suggest the possibility of a seizure focus.  It may very well be that the seizure focus is in an ___________ region, and it will be necessary to determine that for certain before preceding on with any definitive ablation therapy.  In addition, it needs to be pointed out that the patient has had periods of time with rather prolonged improvement in his seizures.  This occurred when growth hormone, which had  been given to him, was stopped.  His seizures began again when he started to show growth on his own indicating ________ elaboration of growth hormones.  PHYSICAL EXAMINATION:  On his examination today, blood pressure is 100/60, resting pulse 96, respiratory rate 24, temperature afebrile.  The patient has a VSD murmur.  He has a general physical examination that is otherwise normal with no signs of infection.  Neurologic examination shows he is awake, alert, his speech is distinct.  He makes good  eye contact.  He follows commands. Cranial nerves were normal. Strength is near normal.  No drift.  Fine motor movements are clumsy but this is at baseline.  His gait was slightly unsteady, but this is also at baseline.  Deep tendon reflexes virtually absent.  He had bilateral flexor plantar responses.  DISPOSITION:  The patient is discharged home in good condition.  He will continue his current medications.  Going higher on his medications has led to significant sedation for this child.  We have been through virtually every other medication that is marketed that is appropriate for complex partial seizures.  I will be in contact with Dr. Melvyn Neth to discuss further treatment from this point on.  We will plan to admit [name] to the hospital on the 16th and 17th of February to carry out his last IVIG course unless Dr. Melvyn Neth disagrees. DD:  10/19/00 TD:  10/19/00 Job: [id number]

## 2011-02-07 NOTE — Discharge Summary (Signed)
NAME:  [name], [name]               ACCOUNT NO.:  [id number]   MEDICAL RECORD NO.:  [id number]          PATIENT TYPE:  INP   LOCATION:  6153                         FACILITY:  [facility]   PHYSICIAN:  Orie Rout, M.D.DATE OF BIRTH:  [birth year]   DATE OF ADMISSION:  09/25/2006  DATE OF DISCHARGE:  09/28/2006                               DISCHARGE SUMMARY   REASON FOR HOSPITALIZATION:  This is a 26 year old with a history of a  seizure disorder, hypothyroidism, and mitral valve prolapse with a 1-  week history of intermittent fever, vomiting, and diarrhea, with a  possible urinary tract infection based on the urinalysis.   SIGNIFICANT FINDINGS:  Urinalysis from clinic showed 3+ blood, 2+  protein, nitrite positive, 2+ leukocyte esterase.  Urine culture had no  growth and was final at [employer].  Admit white blood cell count was  6.4 with a hemoglobin of 11.8 and platelets 383.  He does have a history  of a low white blood cell count in the past, though it was, on  admission, within normal limits.  He had 86% neutrophils on admission.   DISCHARGE LABS:  His blood cultures had no growth to date at 4 days.  His stool cultures grew nothing suspicious that needed to be treated  with antibiotics.  Potassium was 3.5, glucose was slightly elevated at  117, I believe.  White blood cell count was 4.5 on discharge with 49%  lymphocytes.  His BMP was otherwise unremarkable.   TREATMENT:  The patient was started on cephalexin in the clinic, and his  antibiotics were changed over to IV ceftriaxone on admission through his  [name].  As his blood and urine cultures were with no growth, with  the urine cultures being final and the blood cultures at 4 days being  negative, and with viral gastroenteritis most likely, we discontinued  his ceftriaxone after 4 days of treatment.  His IV fluids were run at  maintenance down to 1/2 maintenance, as his p.o. intake increased on  discharge and he  was not vomiting.  He was stable prior to discharge.   OPERATIONS AND PROCEDURES:  None.   FINAL DIAGNOSES:  1. Viral gastroenteritis.  2. History of leukopenia.  3. Seizure disorder.  4. Hypothyroidism.  5. Hypokalemia.  6. Mitral valve prolapse.   DISCHARGE MEDICATIONS AND INSTRUCTIONS:  1. Tegretol 250 mg q. a.m., 250 mg q. p.m., 300 nightly.  2. Topamax 150 mg t.i.d.  3. Keppra 1250 mg b.i.d.  4. Synthroid at 75 mcg daily.  5. Primidone 125 mg q. a.m. and 250 mg nightly.  6. Clarinex 5 mg nightly.  7. Klor-Con 20 mEq 4 times a day.  8. K-Phos 500 mg q.i.d.   PENDING RESULTS/ISSUES TO BE FOLLOWED:  Stool cultures and blood  cultures at [facility], follow up decreased diarrhea.  Follow up with Dr.  Clarene Duke on October 07, 2006, at 2 p.m.   DISCHARGE WEIGHT:  34.5 kg.   DISCHARGE CONDITION:  Improved.   This will be faxed over to Dr. Clarene Duke today, September 28, 2006.     ______________________________  Norton Blizzard, M.D.    ______________________________  Orie Rout, M.D.    SH/MEDQ  D:  09/28/2006  T:  09/29/2006  Job:  [id number]   cc:   Dr. Clarene Duke

## 2011-02-07 NOTE — Discharge Summary (Signed)
NAME:  [name], GLOSS NO.:  [id number]   MEDICAL RECORD NO.:  [id number]          PATIENT TYPE:  INP   LOCATION:  6120                         FACILITY:  [facility]   PHYSICIAN:  Pediatrics Resident    DATE OF BIRTH:  October 15, 1985   DATE OF ADMISSION:  09/02/2004  DATE OF DISCHARGE:  09/03/2004                                 DISCHARGE SUMMARY   REASON FOR HOSPITALIZATION:  Increased seizure activity.   SIGNIFICANT FINDINGS:  This is a 25 year old male with history of multiple  medical problems and a severe seizure disorder who was admitted after  several seizures the prior day likely secondary to viral  URI/gastroenteritis.  He was not eating well at admission so was given a  20/kg normal saline bolus and then a run at maintenance fluids overnight.  His p.o. intake and urine output were improved in the morning, and there was  no seizure activity observed for 24 hours.  The patient is currently feeling  well.   TREATMENT:  IV fluids.   OPERATIONS AND PROCEDURES:  None.   FINAL DIAGNOSIS:  Increased seizure activity secondary to viral symptoms,  now back to baseline.   DISCHARGE MEDICATIONS:  1.  Tegretol 250 mg q.7:00 a.m. and 1:00 p.m., and 300 mg q.9:00 p.m.  2.  Topamax 150 mg p.o. q.7:00 a.m., 1:00 p.m., and 9:00 p.m.  3.  Keppra 1,250 mg p.o. q.7:00 a.m. and 9:00 p.m.  4.  Synthroid 75 mcg daily.  5.  Mysoline 125 mg q.7:00 a.m. and 250 mg q.9:00 p.m.  6.  Potassium 20 mEq q.7:00 a.m., 1:00 p.m., 5:00 p.m., and 9:00 p.m.  7.  Phenobarbital 65 mg IV p.r.n. seizure activity.  8.  Ativan 2 mg IV p.r.n. seizure activity.   The patient is to contact his primary care physician or return if he has  worsening seizure activity.   FOLLOWUP:  As needed with primary care physician and neurologist, Dr.  Sharene Skeans.   DISCHARGE CONDITION:  Good.       PR/MEDQ  D:  09/03/2004  T:  09/03/2004  Job:  [id number]   cc:   Fonnie Mu, M.D.  Fax: [phone]

## 2011-02-07 NOTE — Discharge Summary (Signed)
[place]. [facility]  Patient:    [name], [name] Visit Number: [id number] MRN: [id number]          Service Type: PED Location: PEDS 6150 01 Attending Physician:  Enos Fling Dictated by:   Deanna Artis. Sharene Skeans, M.D. Admit Date:  10/03/2001 Discharge Date: 10/06/2001   CC:         [facility]   Discharge Summary  FINAL DIAGNOSES:  Intractable complex partial seizures, mild mental retardation, growth delay with panhypopituitarism.  PROCEDURE:  None.  COMPLICATIONS:  None.  SUMMARY OF HOSPITALIZATION:  The patient was admitted with intractable seizures.  This is his second admission in a week.  We have switched him from Tegretol to Trileptal.  On the 13th of January, the patient was admitted and had a series of brief seizures lasting about 10-30 seconds.  These were not treated with Ativan and phenobarbital as they had been in the past, but later that morning, the patient had a series of eleven seizures, all 15 seconds in duration, associated with guttural sounds, eyes rolled up, eyelids closed, sometimes with left facial twitching, one hard in my evaluation associated with postictal left paresis which cleared.  The patient was treated with Ativan 2 mg and phenobarbital 60 mg, and did well until later that evening when the patient had two seizures 55 minutes apart lasting 10-20 seconds, one associated with arching in the bed, the other with clenching of his right hand and unresponsiveness.  The decision was made to give him Ativan 2 mg, phenobarbital 60 mg, and he slept through the night having a single 10-second episode with arching of his back, unresponsiveness, tachycardia into the 120s, and clenching of his left fist.  This only lasted for about 10 seconds.  Since that time, over 24 hours has past and he has had no further seizures.  We have increased his Trileptal from 150 mg twice a day to 300 mg 3 times a day and he  has tolerated it very well.  Sodium, yesterday, was 136.  By oversight, it was not checked today and will be checked before he is discharged.  DISCHARGE MEDICATIONS: 1. Keppra 1250 mg twice a day. 2. Topamax 125 mg 3 times a day. 3. Trileptal 300 mg 3 times a day. 4. Synthroid 0.075 mg per day. 5. Potassium chloride 10 mEq twice a day.  PHYSICAL EXAMINATION:  VITAL SIGNS:  Pulse 80, respirations 22, temperature 97.6.  HEENT:  No infections.  LUNGS:  Clear.  HEART:  No murmurs.  Pulse was normal.  EXTREMITIES:  Negative.  NEUROLOGIC:  The patient was mildly lethargic, but near his baseline.  Cranial nerves were normal.  Motor examination showed fairly normal strength with slight decreased effort, somewhat clumsy fine motor movements, no drift. Sensory examination intact to primary and cortical modalities.  Gait was slightly broad-based.  He is areflexic.  DISCHARGE MEDICATIONS:  The patient is discharged home on the following medications.  In addition, he has a vagal nerve stimulator.  The episodes have been too brief to make use of it.  He will return to school on Friday if he is physically well.  The family will be in touch with myself concerning further adjustments of medication as is needed. Dictated by:   Deanna Artis. Sharene Skeans, M.D. Attending Physician:  Enos Fling DD:  10/06/01 TD:  10/06/01 Job: [id number]

## 2011-02-07 NOTE — Discharge Summary (Signed)
NAME:  [name], [name]                     ACCOUNT NO.:  [id number]   MEDICAL RECORD NO.:  [id number]                   PATIENT TYPE:  INP   LOCATION:  6151                                 FACILITY:  [facility]   PHYSICIAN:  Genene Churn. Love, M.D.                 DATE OF BIRTH:  29-Mar-1986   DATE OF ADMISSION:  10/27/2002  DATE OF DISCHARGE:  10/28/2002                                 DISCHARGE SUMMARY   CHIEF COMPLAINT:  This was one of multiple [facility] admissions  for this 25 year old right-handed single white male admitted from the  emergency room for evaluation of recurrent seizures.   HISTORY OF PRESENT ILLNESS:  The patient has a long history of complex  partial seizures that are intractable despite the use of multiple different  anticonvulsants and vagal nerve stimulator.  Rarely, his seizures are  stopped by the use of his stimulator.  He was admitted in 12/03, with a  viral illness from Rotavirus and had a low-grade fever.  Since that time he  has had two admissions for intractable seizures, 09/25/02 and 10/06/02, usually  treated per [name] with 2 mg of IV Ativan and 65 mg of IV phenobarbital  in addition to his regularly scheduled anticonvulsants.  Early on the  morning of 10/26/02, he had a seizure at about 5:30 a.m., he had a repeat  seizure at about 5:30 p.m., and from midnight 10/25/02 to 1:30 a.m. on 10/26/02,  he had approximately five seizures.  Each lasted at that time less then 2  minutes and were characterized by loss of consciousness, a stare, and brief  shaking.  He was brought to the emergency room and treated with 2 mg of IV  Ativan and 65 mg of phenobarbital.  He has not recently had any viral  illnesses, cough, or shortness of breath.  He does have a history of  bronchitis, and at home intermittently will use albuterol inhaler.   MEDICATIONS:  1. Tegretol 250 mg at 7 a.m, 1 p.m., and 300 mg at 9 p.m.  2. Keppra 1250 mg b.i.d. at 7 a.m. and 9 p.m.  3. Topimax 125 mg t.i.d. at 7 a.m., 1 p.m., and 9 p.m.  4. Mysoline 125 mg q.a.m. and 250 mg at night at 7 a.m. and 9 p.m.  5. KCL 20 mEq q.i.d. at 7 a.m., 1 p.m., 5 p.m., and 9 p.m.  6. Synthroid 0.075 mg one daily.   PAST MEDICAL HISTORY:  1. Growth delay.  2. Mental retardation.  3. Intractable seizures.  4. Status post vagal nerve stimulator.  5. History of Brown syndrome with eye surgery x2.  6. History of Rotaviral infections.   ALLERGIES:  1. DEPAKOTE.  2. DILANTIN.   PHYSICAL EXAMINATION:  GENERAL:  A well-developed, thin white male who  appeared younger then his stated age.  VITAL SIGNS:  Blood pressure in the right arm was 110/60, left  arm 120/60,  heart rate was 96.  He had no bruits.  NEUROLOGIC:  Mental status:  He is alert and oriented x3.  Cranial nerve  examination revealed visual fields were full.  He had evidence of bilateral  ptosis.  His disks were flat.  He had a dysarthria.  His tongue was midline,  the uvula was midline, gags were present.  His motor examination revealed  that he moved all extremities.  He had ulnar hand positioning with his hands  held outstretched.  There were no deep tendon reflexes.  He felt vibration  in all extremities and pinprick in all extremities.  HEENT:  Tympanic membranes were clear.  LUNGS:  Clear.  HEART:  Revealed a murmur that was a systolic type.  ABDOMEN:  Bowel sounds were normal.  He had a left anterior chest vagal  nerve stimulator and a right anterior chest [name].   LABORATORY DATA:  White blood cell count of 6200, hemoglobin 10.2,  hematocrit 29.9, platelets 299,000.  Sodium 136, potassium 3.3, chloride  114, CO2 content 19, BUN 13, creatinine 0.6, glucose 135.  AST 17, ALT 18,  alkaline phosphatase 143, total bilirubin 0.2.  Carbazole level 12.9,  amicycline level 7, phenobarbital level 9.7.   HOSPITAL COURSE:  The patient was admitted, had a new set of seizures  beginning on the mid-morning of 10/27/02,  requiring a second dose of high dose  phenobarbital at 65 mg and 2 mg of Ativan.  He slept most of the day on  10/27/02, he did have a low-grade fever.  By the morning of 10/28/02, felt well,  had a little bit of a cough, but his lungs sounded very clear and had no  fever.   IMPRESSION:  1. Intractable complex partial seizures, code 345.41.  2. Growth delay, code 783.4.  3. Mental retardation, code 317.  4. Hypothyroidism, code 244.49.  5. 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   PLAN:  Discharge the patient on his routine medications and use p.r.n.  albuterol inhaler.  He will return to Dr. Sharene Skeans next week for follow up  evaluation.                                               Genene Churn. Sandria Manly, M.D.    JML/MEDQ  D:  10/28/2002  T:  10/28/2002  Job:  [id number]   cc:   Fonnie Mu, M.D.  [address] [place]  [place] [address]  Fax: [phone]   Deanna Artis. Sharene Skeans, M.D.  [address]  [address]  [place]  [place] [address]  Fax: [phone]

## 2011-02-07 NOTE — Discharge Summary (Signed)
NAME:  [name], [name] NO.:  [id number]   MEDICAL RECORD NO.:  [id number]          PATIENT TYPE:  OBV   LOCATION:  6123                         FACILITY:  [facility]   PHYSICIAN:  Gerrianne Scale, M.D.DATE OF BIRTH:  [birth year]   DATE OF ADMISSION:  08/01/2005  DATE OF DISCHARGE:  08/02/2005                                 DISCHARGE SUMMARY   PRIMARY CARE PHYSICIAN:  Dr. Thurston Pounds, [facility].   REASON FOR HOSPITALIZATION:  Fever and central line.   HOSPITAL COURSE:  [name] is a 25 year old male with seizure disorder, mild  mental retardation, hypothyroidism, growth disorder and mitral valve  prolapse, who has a central line for intravenous access.  He had recently  been admitted and found to have a positive blood culture for coagulase-  negative Staph which was sensitive for levofloxacin, on which he was sent  home.  He had received about 4 days of therapy, at which time dad noted that  he had a fever to 101 degrees Fahrenheit at home and had decreased activity  levels.  He was brought into the hospital for blood cultures and continued  monitoring.  An additional blood culture was obtained on admission, as was a  CBC with a normal white blood cell count of 3.3.  This blood culture was  followed for 24 hours and was negative to date, the day of discharge.  He  was continued on his home medications as well as his levofloxacin 500 mg IV  q.24 h.  He remained afebrile through the remainder of his hospital course.   FINAL DIAGNOSES:  1.  Seizure disorder.  2.  Mild mental retardation.  3.  Hypothyroidism.  4.  Growth disorder.  5.  Brown's syndrome.  6.  Mitral valve prolapse.   DISCHARGE MEDICATIONS:  1.  Tegretol 250 mg p.o. twice daily, 300 mg p.o. nightly.  2.  Topamax 150 mg p.o. 3 times daily.  3.  Keppra 1250 mg p.o. twice daily.  4.  Synthroid 75 mcg p.o. q.a.m.  5.  Primidone 125 mg p.o. q.a.m. and 250 mg p.o. nightly.  6.  Klor-Con 20 mEq 4  times daily.  7.  Potassium phosphate 500 mg p.o. 4 times daily.  8.  Clarinex 5 mg p.o. q.a.m.  9.  Levaquin 500 mg IV q.24 h., continue for an additional 7 days until      August 09, 2005.   FOLLOWUP:  Please follow up with Dr. Thurston Pounds on August 11, 2005 to  obtain a blood culture after completing antibiotics.     ______________________________  Pediatrics Resident    ______________________________  Gerrianne Scale, M.D.    PR/MEDQ  D:  08/02/2005  T:  08/04/2005  Job:  [id number]

## 2011-02-07 NOTE — Consult Note (Signed)
NAME:  BRANNEN, KOPPEN NO.:  [id number]   MEDICAL RECORD NO.:  [id number]          PATIENT TYPE:  INP   LOCATION:  6148                         FACILITY:  [facility]   PHYSICIAN:  Marlan Palau, M.D.  DATE OF BIRTH:  05/11/1986   DATE OF CONSULTATION:  01/13/2005  DATE OF DISCHARGE:                                   CONSULTATION   HISTORY OF PRESENT ILLNESS:  [name] is a 25 year old white male born  [birth year] with a history of chronic seizure disorder, growth delay,  hypothyroidism.  The patient's seizures have been under very poor control.  The patient has [name] access for medications at home, but comes into  the hospital frequently for seizure exacerbations.  The patient comes in at  this point for yet another seizure exacerbation with 25+ seizures at home.  The patient received rectal diazepam yesterday, and then later in the  evening received 65 mg of IV phenobarbital and 2 mg of Ativan.  The patient  continued to have seizures.  He was brought into the emergency room by 4:00  a.m.  The patient was given 65 mg of phenobarbital once again through the  ER.  The patient at this point is sleepy, but no further seizures noted.  The patient is being observed for seizure control.   PAST MEDICAL HISTORY:  1.  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  2.  Hypothyroidism, with hypopituitarism.  3.  Ventricular septal defect.  4.  Mitral valve prolapse.  5.  Scoliosis.  6.  Mild mitral regurgitation.  7.  Growth disorder.   The patient is allergic to DEPAKOTE.   CURRENT MEDICATIONS:  1.  Keppra 1,250 mg p.o. b.i.d.  2.  Topamax 150 mg t.i.d.  3.  Tegretol 250 mg b.i.d., 300 mg at night.  4.  Primidone 125 mg q.a.m., 250 mg at bedtime.  5.  Synthroid 0.075 mg daily.   The patient also has intolerance to DILANTIN.   SOCIAL HISTORY:  [place].  The patient is in school.   FAMILY MEDICAL HISTORY:  Not obtained.   REVIEW OF SYSTEMS:  Not notable for any recent significant illness.  The  patient does not have any cough, high fevers, etc.   PHYSICAL EXAMINATION:  VITAL SIGNS:  Blood pressure 104/58; heart rate 110;  respiratory rate 29; temperature afebrile.  GENERAL:  This patient is a small for age white male who is sleepy at the  time of examination.  HEENT:  Head is atraumatic.  Eyes:  Pupils equal, round, and reactive to  light.  Disks soft, flat.  NECK:  Supple.  RESPIRATORY:  Clear.  CARDIOVASCULAR:  Regular rate and rhythm.  No obvious murmurs or rubs noted.  EXTREMITIES:  Without significant edema.  NEUROLOGIC:  Cranial nerves as above.  Facial symmetry is present.  The  patient has full extraocular movements.  The patient has good strength on  all fours.  Deep tendon  reflexes symmetric, normal.  Pinprick, soft touch  and_ vibratory sensation is symmetric, normal.  Cerebellar testing was not  performed.  No drift is seen.   LABORATORY VALUES:  White count 6.4, hemoglobin 12.6, hematocrit 36.7, MCV  80.9.  Sodium 137, potassium 3.6, chloride 111, CO2 17, BUN 7, glucose 118,  creatinine 0.8, total bilirubin 0.5, alkaline phosphatase 113, SGOT 27, SGPT  26, total protein 6.9, albumin 3.4.   IMPRESSION:  1.  History of chronic seizure disorder, with poor control, intractable      seizures.  2.  Mild mitral regurgitation.  3.  Growth delay.  4.  Hypothyroidism.   The patient does have a [name] in place and also has a history of a  vagal nerve stimulator placement.  The patient continues to have very poor  seizure control.  The patient is brought in for observation at this time.  Will follow the patient while in house.  The patient may be discharge if he  appears to be stable.      CKW/MEDQ  D:  01/13/2005  T:  01/13/2005  Job:  [id number]

## 2011-02-07 NOTE — Discharge Summary (Signed)
[name]  Patient:    [name], [name]                  MRN: [id number] Adm. Date:  [id number] Disc. Date: [id number] Attending:  Mick Sell                           Discharge Summary  FINAL DIAGNOSES: 1. Intractable complex partial seizures with secondary generalization, 345.41,    345.11. 2. Developmental delay. 3. Growth hormone deficiency. 4. Panhypopituitarism. 5. Mitral regurgitation.  HISTORY OF PRESENT ILLNESS:  The patient was admitted with a series of 15 seizures at home lasting 30 seconds to two minutes with period in between. He had a five-minute generalized seizure on arrival to the emergency department.  He received Ativan.  At home, he had received 2 mg of Valium orally with no noticeable benefit.  The patient had staring to the right, flexion of his right arm.  Length of the seizure varied.  Seizures are often simple partial, many are complex partial, some secondarily generalized.  Etiology for his underlying brain dysfunction is unknown and was the subject of intensive study at [facility] and at [facility].  CURRENT MEDICATIONS: 1. ______ 500 mg in the morning, 1000 mg at nighttime. 2. Topamax 75 mg three times a day, 100 mg at bedtime. 3. Synthroid 75 mcg per day. 4. Tegretol 100 mg in the morning, 200 at noon, 250 at nighttime. 5. Diazepam 2 mg as needed for seizures.  PHYSICAL EXAMINATION:  The patients examination at baseline shows no focal or lateralized findings.  His vital signs on the day of discharge are as follows: Pulse 93, respirations 26, pulse oximetry 100%.  Weight 52 pounds.  He has a blowing crescendo murmur at the left sternal border.  The patient is overall small, has somewhat dysmorphic features with a prominent nasal bridge, small head.  Neurologically, the patient had no focal or lateralized findings.  HOSPITAL COURSE:  On admission at  this time the patient also had right abdominal pain.  The patient was observed, and his symptoms settled down.  CT scan of the abdomen was negative.  He was eating well.  The plan was to discharge him home on his same medications.  We discussed Zonegran and vagal nerve stimulator.  Mother was not present, and I believe that I need to have this discussion with both parents, and we will do this as an outpatient.  The patient is discharged in improved condition.  No change in his medications. DD:  01/10/00 TD:  01/10/00 Job: [id number]

## 2011-02-07 NOTE — Consult Note (Signed)
NAME:  [name], [name] 111                     ACCOUNT NO.:  [id number]   MEDICAL RECORD NO.:  [id number]                   PATIENT TYPE:  INP   LOCATION:  6121                                 FACILITY:  [facility]   PHYSICIAN:  Pramod P. Pearlean Brownie, MD                 DATE OF BIRTH:  1985-12-27   DATE OF CONSULTATION:  DATE OF DISCHARGE:                                   CONSULTATION   REASON FOR REFERRAL:  Seizure.   HISTORY OF PRESENT ILLNESS:  Mr. Otero is a 25 year old Caucasian male who  was admitted with recurrent simple partial seizures since this morning.  The  patient was unable to provide history which was obtained from his sister who  is accompanying him.  The patient developed about 15-20 brief episodes of  right upper extremity and neck focal jerking lasting 20-30 seconds between  3:00 and 5:00 a.m. today.  That stopped after he was given a dose of IV  Phenobarbital 65 mg and Ativan 2 mg as per his seizure protocol from  previous admissions recommended by Dr.  Sharene Skeans.  The patient however later  this morning started having recurrent seizures which are described as having  clonic activity of the right upper extremity with head turned to the right  and blind gaze division .  This lasts for only 15-20 seconds and then  stopped.  They have been occurring in clusters and these increased  30-35  episodes since coming here.  He has been treated in the ER again with IV  Phenobarbital 65 mg and Ativan 2 mg which resulted in only a transient  improvement.  The patient has a longstanding history of seizure disorder,  which began at age 64.  As per his sister he has exactly four distinct type  of seizures, generalized tonic-clonic seizures, as well as simple partial  seizures which are brief, as well as prolonged ones, and complex partial  seizure type.  He has had a vagal nerve stimulator implantation in 2002.  Since then generalized tonic-clonic and complex partial seizures have  decreased in frequency, but the simple partial ones have continued.  He has  had multiple hospitalizations through this hospital for simple partial  status and as a result of this Dr. Sharene Skeans came up with this protocol of IV  Phenobarbital and Ativan which seems to work well for him.  HE HAS BEEN  FOUND TO BE ALLERGIC TO DILANTIN WHICH INCREASES HIS SEIZURE FREQUENCY AND  DEPAKOTE CAUSES SOME MULTIORGAN DYSFUNCTION.  He is presently taking  primidone, Topamax, Keppra and Tegretol.  The patient has no obvious  precipitating factor for his seizure except possibly increased excitement  from being at a motor car race yesterday, and seeing the [employer] of [address].  A few months ago similarly he had a flurry of seizures  after he had met his life idol, Bettey Costa, at a similar meet.  PAST MEDICAL HISTORY:  1. Growth retardation, growth hormone deficiency, mild mental retardation,     seizure disorder, hypothyroidism, scoliosis, Browns syndrome, mitral     valve prolapse.   FAMILY HISTORY:  Not significant for anybody with seizures.   HOME MEDICATIONS:  1. Tegretol 250 twice a day and 300 at night.  2. Topamax 150 three times a day.  3. Keppra 1250 twice a day.  4. Synthroid 75 mcg daily.  5. Primidone 125 in the morning and 250 at night.  6. Potassium 20 mEq four times a day.   PAST SURGICAL HISTORY:  Vagal nerve stimulator implantation and portacath.   MEDICATION ALLERGIES:  1. DILANTIN.  2. DEPAKOTE.  3. DIPHENHYDRAMINE.   SOCIAL HISTORY:  The patient lives at home with his parents.  He is in the  8th grade.  He has physical growth retardation.   REVIEW OF SYSTEMS:  Not significant for recent fever, loss of weight, cough,  chest pain, diarrhea or illness.   PHYSICAL EXAMINATION:  Exam reveals a frail adolescent male who looks a lot  younger and smaller physically than his stated age.  He is afebrile with a  pulse rate of 88 per minute regular, respiratory rate  is 16 per minute,  __________  adequate.  HEAD:  Nontraumatic.  NECK:  Supple without bruit.  CARDIAC:  No murmurs or gallop.  Portacath is noted in the right  infraclavicular region and the vagal nerve stimulator is noted in the left  infraclavicular region.  He has thoracic scoliosis.  ABDOMEN:  Soft, nontender.  The patient has bilateral footdrop left greater  than right.  NEUROLOGIC:  The patient is drowsy and sedated with medications.  He does  open eyes and follows simple commands.  His eye movements are full range.  Pupils are equal, reactive.  Face is symmetric.  He moves all four  extremities equally against gravity.  Deep tendon reflexes are 2+ symmetric,  foot plantars are downgoing.  Coordination and gait cannot be tested.   DATA REVIEWED:  Previous operative note from Dr. Sharene Skeans from March 18, 2004  is reviewed.  No recent labs are available.  Carbamazepine level dated March 15, 2004 was 9.9.  Phenobarbital level is 12.4.  Topamax level is pending.   IMPRESSION:  A 26 year old male with intractable simple and complex partial  seizures with and without generalization with known history of developmental  delay, growth hormone deficiency, who has had a cluster of seizures,  possibly following increased excitement from his recent trip.   PLAN:  I would hold off on further aggressive treatment with benzodiazepines  given the fact that his seizures are only simple partial.  Check levels of  carbamazepine, Phenobarbital, and optimize them.  Check urinalysis, blood  count, blood chemistries, to look for correctable etiologies.  The patient  may benefit with interrogation of the vagal nerve stimulator to check that  it is working properly and with reprogramming it if necessary.  We will  discuss this with Dr. Sharene Skeans, his primary neurologist.  I had a long  discussion with the patient and his sister, and answer questions.  Thank you  for this referral.                                               Pramod P. Pearlean Brownie, MD    PPS/MEDQ  D:  05/09/2004  T:  05/10/2004  Job:  [id number]   cc:   Fonnie Mu, M.D.  Fax: [phone]

## 2011-02-07 NOTE — H&P (Signed)
NAMEDANYELL, AWBREY 111                     ACCOUNT NO.:  [id number]   MEDICAL RECORD NO.:  [id number]                   PATIENT TYPE:  INP   LOCATION:  1824                                 FACILITY:  [facility]   PHYSICIAN:  Melvyn Novas, M.D.               DATE OF BIRTH:  [birth year]   DATE OF ADMISSION:  02/20/2003  DATE OF DISCHARGE:                                HISTORY & PHYSICAL   HISTORY OF PRESENT ILLNESS:  The patient, who is called [name], is admitted  here today after seven weeks of not being readmitted.  He is a 25 year old,  right-handed, white, single male admitted through the emergency room for  evaluation of recurrent seizures that occurred in a cluster activity five  times today as tonic-clonic seizures and seemed to have generalized.  The  initial seizure seems to have been an atonic event and the patient feel  between a Baker's rack and a sofa at his home where his parents found him  unconscious before he developed tonic-clonic seizures.  Out of fear that  this was a concussion contusion, they brought him to the hospital before  accessing the [name] themselves, as they have done in the past.   The patient has a long history of complex partial seizures, intractable, in  spite of multiple anticonvulsants given by mouth daily.  A vagal nerve  stimulator was implanted and a [name] was implanted so that the patient  could use IV phenobarbital and IV benzodiazepines at home.  He had seized in  the past frequently when he developed fevers from viral infections.  He has  seized after a head injury previously and now had seized again probably  spontaneously in form of an atonic seizure at home.  He then developed  multiple tonic-clonic seizures over the next 20 minutes and was brought to  the hospital where the seizure activity stopped after IV phenobarbital was  applied.  The patient is the second born child to a Caucasian, middle-class  family who use city  water.  Both parents are gainfully employed.  The father  works for a company within his home town limits.  The mother works at the  [employer] as a Runner, broadcasting/film/video.  His older sister is currently going through pre-  med in college.  The patient suffers also from a growth hormone deficiency  and in the past has been substituted, but now is no longer undergoing that  treatment.  He was treated today with Ativan 2 mg and phenobarbital  60 mg  IV.   CURRENT MEDICATIONS:  1. Tegretol 100 mg, 2-1/2 in the morning, 2-1/2 at 1 p.m. and three at     bedtime.  2. Topamax 100 mg one in the morning, 1-1/2 at nighttime.  3. Topamax 25 mg one in the morning, one at midday.  4. Keppra 500 mg 2-1/2 twice a day.  5. Mysoline 250 mg 1-1/2 in the morning  and one at night.  6. Synthroid 75 mcg once a day.  7. K-Dur 20 mEq four times a day.  The patient has chronic hypokalemia that     is believed to have been precipitated by anticonvulsants.  In addition,     his vagal nerve stimulator is set up near the maximum setting since     February 2004.   ALLERGIES:  DILANTIN.  DEPAKOTE.   PAST MEDICAL HISTORY:  1. Growth hormone deficiency.  2. Ventricular septal defect.  3. Pulmonary valve stenosis.  4. Topamax induced hypokalemia.   PHYSICAL EXAMINATION:  GENERAL:  No seizures after the IV medication was  completely applied.  No signs of infection.  Very small for age, frail-  appearing, Caucasian boy with facial injuries.  HEENT:  Hematoma over the left brow.  LUNGS:  Clear.  HEART:  Soft holosystolic murmur over the pulmonary valve, loud S2.  ABDOMEN:  Soft, nontender.  EXTREMITIES:  Tight heel cords bilaterally.  Somewhat thin scoliotic changes  of the spine.  NEUROLOGIC:  The patient is drowsy, but follows commands.  Speech is mildly  slurred.  Cranial nerves round and reactive.  The patient has bilateral  ectropia.  He has small cataracts.  Visual fields are full.  Symmetrical  facial strength.   Tongue is midline without tongue bite.  Air conduction  greater than bone conduction.  The patient shows near to normal strength in  the arms and legs.  Fine motor movements are clumsy particularly in finger-  to-nose test.  There is no pronator drift.  I cannot detect peripheral  neuropathy, nor tremor, nor dystaxia.  The patient is cooperating.  Gait  deferred.   PLAN:  1. The patient will have to stay on current medications.  He will receive     his evening medications p.o. when alert and awake enough.  2. He will receive a regular diet.  3. We will check on his Chem 7, CBC with differential.  4. A CT was obtained today showing calcifications or other mineral deposits     in the basal ganglia bilaterally symmetric.  No ventricular abnormality.     The question was raised if there is a contusion injury in the right     occiput, but on discussion radiology we decided to follow up on this and     rather accept this as a possible tentorial calcification line.  5. He will hopefully be discharged tomorrow after observation.                                               Melvyn Novas, M.D.    CD/MEDQ  D:  02/20/2003  T:  02/20/2003  Job:  [id number]   cc:   Fonnie Mu, M.D.  [address] [place]  [place] [address]  Fax: [phone]   Deanna Artis. Sharene Skeans, M.D.  [address]  [address]  [place]  [place] [address]  Fax: [phone]

## 2011-02-07 NOTE — Discharge Summary (Signed)
[name], [name] 111                     ACCOUNT NO.:  [id number]   MEDICAL RECORD NO.:  [id number]                   PATIENT TYPE:  INP   LOCATION:  6148                                 FACILITY:  [facility]   PHYSICIAN:  Deanna Artis. Sharene Skeans, M.D.           DATE OF BIRTH:  1986-06-26   DATE OF ADMISSION:  05/17/2003  DATE OF DISCHARGE:  05/19/2003                                 DISCHARGE SUMMARY   FINAL DIAGNOSES:  1. Intractable simple and complex partial seizures with secondary     generalization (345.5, __________.5, 345.11).  2. Hypokalemia secondary to Topamax.  3. Hypothyroidism.  4. Brown's syndrome.  5. Muscular ventricular septal defect.  6. Lack of expected physiologic development (73.42.)   PROCEDURES:  None.   HOSPITAL COURSE:  The patient is a 25 year old young man with intractable  complex partial seizures who was admitted with clusters of sensory and motor  seizures involving the left arm associated with unresponsiveness.  By  parents account, he had about 20 on the day of admission.  The patient has  had two or three episodes of clusters of seizures that have been able to be  stopped at home with an IV [name] using 65 mg of phenobarbital and 2 mg  of Ativan.  This was done around 2 in the morning of the day he was  admitted.  When seizures recurred, he was brought to the hospital for  admission and required a second dose of medication.  The patient was  postictal and under the effects of benzodiazepine and phenobarbital thus was  not able to be discharged the next day because he was very sleepy and not  eating.   Over the last 24 hours the patient has become to be more awake and  responsive.  He is sitting up, bright and alert and is at his baseline at  this time.   LABORATORY DATA:  Sodium 139, potassium 3.4, chloride 114, CO2 16, BUN 9,  creatinine 0.7, glucose 119.  Arterial blood gas pH 7.36, pCO2 29, pO2 131,  bicarbonate 16 (metabolic acidosis  is related to recent Topamax).  Carbamazepine level 11.0, phenobarbital level 19.7.  Other levels at this  time are pending.   The patient had EEG, which showed an 11 Hz generalized alpha rhythm without  clear posterior dominance.  There was generalized slowing into the delta  range that I did not feel was focal after carefully looking at the study.   The patient had a single cluster of sharply contoured flow waves over the  right central region.  There were no electrographic seizures.   Other laboratory studies include calcium 8.9, total protein 6.6, albumin  3.7, AST 24, ALT 25, alkaline phosphatase 169, total bilirubin 0.4.   DISCHARGE MEDICATIONS:  1. The patient is discharged home on Topamax 150 mg three times a day.  2. Tegretol 250 mg in the morning, afternoon and 300 mg at night time.  3. Keppra 1250 mg twice daily.  4. Synthroid 75 mcg per day.  5. Mysoline 125 mg in the morning and 250 mg at night time.  6. Potassium 20 mEq (Klor-Con) 7 a.m., 1 p.m., 5 p.m., and 9 p.m.   DRUG ALLERGIES:  Dilantin which enhances his seizures and Depakote which  caused generalized metabolic acidosis and liver and renal dysfunction.   DISPOSITION:  The patient is discharged home in improved condition.   DISCHARGE PHYSICAL EXAMINATION:  VITAL SIGNS:  Blood pressure 86/51, resting  pulse 67, respirations 18.  Temperature 36.3.  HEENT:  No signs of infection.  Neck supple, full range of motion.  No  cranial or cervical bruits.  LUNGS:  Clear to auscultation.  HEART:  Holosystolic murmur at the left sternal border.  Pulses are normal.  ABDOMEN:  Soft, scaphoid.  Bowel sounds normal.  No hepatosplenomegaly.  EXTREMITIES:  Some wasted.  No spasticity.  His heel cords were a bit tight.  NEUROLOGIC:  The patient was awake and alert, speaking in full sentences.  He is mildly retarded.  He has adequate language.  Some problems with memory  and fund of knowledge.  Cranial nerve examination - round,  reactive pupils.  He has mild problems with extraocular movements related to Brown's syndrome,  status post two operations.  Symmetric facial strength, midline tongue and  uvula.  Air conduction greater than bone conduction bilaterally.  Motor  examination:  The patient has near normal strength, no pronator drifts and  mild clumsiness in his fine motor movements.  Gait is slightly broad based  and stiff.  Deep tendon reflexes are virtually absent.  He had bilateral  flexor plantar responses.                                                Deanna Artis. Sharene Skeans, M.D.    [facility]  D:  05/19/2003  T:  05/20/2003  Job:  [id number]

## 2011-02-07 NOTE — Discharge Summary (Signed)
NAME:  [name], [name]               ACCOUNT NO.:  [id number]   MEDICAL RECORD NO.:  [id number]          PATIENT TYPE:  INP   LOCATION:  6150                         FACILITY:  [facility]   PHYSICIAN:  Deanna Artis. Hickling, M.D.DATE OF BIRTH:  [birth year]   DATE OF ADMISSION:  10/15/2004  DATE OF DISCHARGE:  10/16/2004                                 DISCHARGE SUMMARY   FINAL DIAGNOSES:  1.  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  2.  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  1.  Ventricular septal defect.  2.  Mitral valve prolapse.  3.  Down's syndrome.  4.  Scoliosis.  5.  Mild mental retardation.  6.  Growth disorder.   PROCEDURES:  None.   COMPLICATIONS:  None.   SUMMARY OF HOSPITALIZATION:  The patient was admitted to [facility]  with a cluster of somatosensory seizures that occurred on the morning of  October 15, 2004.   The patient was treated by his family with IV Ativan 2 mg and phenobarbital  65 mg and continued to have episodes of arm and leg stiffening on the left,  left eye twitching and staring.  He had episodes every five minutes from  about 5 a.m. to 6:45 a.m.   When the patient was treated, he became somewhat combative, and seizures  resumed around 7:15.  The patient was brought to the emergency room, and  plans were made to treat him with Ativan and phenobarbital.  He was given 2  mg of Ativan.  He was no given more phenobarbital.  His seizures stopped and  have not recurred.   LABORATORY STUDIES:  Urinalysis showed a specific gravity of 1.015, PH 7.0.  Negative chemistries.  Sodium 137, potassium 3.5, chloride 115, CO2 16,  glucose 111, BUN 10, creatinine 0.6, calcium 8.6, total protein 6.7, albumin  3.4, AST 22, ALT 24, alkaline phosphatase 130, total bilirubin 0.6.  Carbamazepine level was 13.2 (the patient had received carbamazepine before  coming to the  hospital).  White blood cell count 3.8, hemoglobin 12.4,  hematocrit 36.3, MCV 79.5, platelet count 297,000, 70% poly, 25% lymphs, 4%  mono.  The pH was 7.34, pCO2 29.6, pO2 110, bicarbonate 15.9.  The acidosis  is related to Topamax effect.   Separate issues have emerged during this hospitalization.  The patient was  up until 4 o'clock this morning, and, consequently, is very sleepy.  It is  not uncommon for him to have problems with falling asleep and then being  sleepy the whole day.  I believe that he has a problem with a phase shift  disorder of sleep, that is that he is staying up late and then shifting the  time when he sleeps to a time when it is not convenience or appropriate for  him to be educated.   The patient also had a low TSH.  I suspect that this is because of replacing  his thyroid and his hypopituarism, but we need to check his free T4 level  the next time that  blood is drawn.  In addition, because he had an elevated  carbamazepine level, we need to check trough primidone, phenobarbital,  carbamazepine, and topiramate levels.  There is no reason to check Keppra,  because it is difficult to obtain and does not have any true meaning.   On examination today, pulse is 78, blood pressure 84/37, respiratory rate  24, temperature 36.0, oxygen saturation 96% on room air.  The patient is in  a somewhat uncooperative mood, nonetheless, visual fields are full,  extraocular movements full and conjugate.  Symmetric facial strength,  midline tongue.  Motor examination shows poor effort, but he has the ability  to move all four extremities quite well, particularly when he is being  combative.  Reflexes are absent.  [name], other than his bad mood, is at  neurologic baseline.   He is discharged home on the following medications:   DISCHARGE MEDICATIONS:  1.  Tegretol 250 mg at 7 a.m., 1 p.m., 3 p.m. and 9 p.m.  2.  Topamax 125 mg t.i.d.  3.  Keppra 1250 mg twice daily.  4.   Synthroid 75 mcg daily.  5.  Mysoline 125 mg in the morning, 250 mg at nighttime.  6.  Potassium 20 mEq at 7 a.m., 1 p.m., 5 p.m., 9 p.m.   ALLERGIES:  DILANTIN WHICH CAUSES SEIZURES, DEPAKOTE WHICH CAUSED WIDESPREAD  ACIDOSIS AND DECREASED LEVEL OF RESPONSIVENESS.   CONDITION ON DISCHARGE:  The patient is discharged in an improved condition,  he is quite sleepy.      [facility]/MED[facility]  D:  10/16/2004  T:  10/16/2004  Job:  [id number]   cc:   Orie Rout, M.D.  [address]  [place] [address]  Fax: [phone]

## 2011-02-07 NOTE — Discharge Summary (Signed)
[place]. [facility]  Patient:    [name], [name] Visit Number: [id number] MRN: [id number]          Service Type: PED Location: PEDS [phone] Attending Physician:  Mick Sell Dictated by:   Deanna Artis. Sharene Skeans, M.D. Admit Date:  11/27/2001 Discharge Date: 11/29/2001   CC:         Fonnie Mu, M.D.   Discharge Summary  DATE OF BIRTH:  November 04, 1985  DISCHARGE DIAGNOSES: 1. Intractable complex partial seizures. 2. Panhypopituitarism. 3. Failure to thrive. 4. Borderline intelligence quotient (IQ).  PROCEDURES:  None.  HOSPITAL COURSE:  [name] was admitted on the morning of November 27, 2001, with a breakthrough seizure that began at 1:30 a.m. and another at 5 a.m.  He said these were controlled by slapping his vagal nerve stimulator.  Unfortunately, between 6:30 a.m. and 7 a.m., the patient had one seizure after another that were not able to be controlled.  The typical seizure that [name] has involves drawing of his face and his arms and legs become stiff.  There is also often jerking at the left side.  The patient had 11 episodes with each lasting about 30 seconds.  The patient was treated with 2 mg of Ativan and 65 mg of phenobarbital and seizures ceased.  He was observed in the hospital and unfortunately he had two more seizures with one at 8:15 p.m. on March 8, lasting less than 10 seconds with jerking of his left arm and tensing of his body with the other on March 9, at 0600 hours, lasting less than 10 seconds.  Heart rate was up to 128.  The decision was made to give him 1 mg of Ativan and 65 mg of Phenobarbital. The patient was observed overnight.  He has done well without subsequent seizures.  Therefore, he will be discharged home today.  DISCHARGE MEDICATIONS: 1. Keppra 500 mg 2-1/2 twice daily. 2. Topamax 25 mg one three times daily. 3. Trileptal 300 mg 1-1/2 in the morning and 1-1/2 mid day and 2 at bedtime. 4.  Synthroid 0.075 mcg one daily. 5. Potassium chloride 20 mEq three times a day.  LABORATORY DATA AND X-RAY FINDINGS:  Sodium 137, potassium 3.5, chloride 114, bicarb 17 (fairly shortly after his prolonged seizures), glucose 99, BUN 13, creatinine 0.7, calcium 9.1.  Total protein 6.6, albumin 3.3, SGOT 22, SGPT 17, Alk phos 173, total bilirubin 0.6, magnesium 1.9, phosphorus 3.1.  CBC with white blood cell count 3500, hemoglobin 11.1,. hematocrit 32.3, MCV 76.2, platelet count 234,000; neutrophils 43 (1500 absolute neutrophils), lymphs 51, monos 5, eos 1.  DISPOSITION:  The patient is discharged home to the care of his sister [name], and his grandfather.  His parents are out of town and will be for the remainder of the week.  She has been instructed to bring him back if he has further breakthrough seizures that are clustering that they did prior to admission.  DISCHARGE PHYSICAL EXAMINATION:  VITAL SIGNS:  Blood pressure 82/31, resting pulse 78, respiratory rate 23, temperature 96.9, pulse oximetry 100%.  GENERAL:  The patient was sleepy, but arousable.  He followed all commands. He has a nonfocal neurologic examination with rather poor effort.  HEENT:  He has no signs of infection in the head and neck.  LUNGS:  Clear.  HEART:  No murmurs.  Pulses normal.  ABDOMEN:  Soft, nontender.  EXTREMITIES:  Well-formed.  CONDITION ON DISCHARGE:  Improved condition.  SPECIAL INSTRUCTIONS:  He is to return to school on March 11.  FOLLOWUP:  He should return to see me on his next regularly scheduled appointment. Dictated by:   Deanna Artis. Sharene Skeans, M.D. Attending Physician:  Mick Sell DD:  11/29/01 TD:  11/30/01 Job: [id number]

## 2011-02-07 NOTE — Discharge Summary (Signed)
NAME:  [name], [name] NO.:  [id number]   MEDICAL RECORD NO.:  [id number]          PATIENT TYPE:  INP   LOCATION:  6148                         FACILITY:  [facility]   PHYSICIAN:  Drue Dun, M.D.       DATE OF BIRTH:  Jul 06, 1986   DATE OF ADMISSION:  03/25/2006  DATE OF DISCHARGE:  03/30/2006                                 DISCHARGE SUMMARY   DATES OF HOSPITALIZATION:  March 25, 2006 to March 30, 2006.   REASON FOR HOSPITALIZATION:  This is a an 25 year old male with a  complicated past medical history including a seizure disorder,  hypothyroidism who came in with an increased seizure activity x2 seizures,  the first that required Ativan to clear and in both cases he fell and hit  his head and a fever.  On admission, his neuro exam was at baseline.  There  was a soft tissue lump in the posterior neck from the fall.  We did neck  films and also did a CT to clear the C-spine.  There was no acute findings  noted.  During his hospital stay, the patient also had two generalized tonic-  clonic seizures that self resolved.  Both of these were on March 27, 2006 and  also his temperature curve trended down during the admission.  His last  fever was recorded March 27, 2006 and the patient was at neurological baseline  on discharge.   TREATMENT:  All seizure meds were determined to be within therapeutic range  and therefore continued to be the same.  His vagal nerve stimulator was  reprogrammed by Dr. Sharene Skeans after the two seizures on the 6th.  For a  coagulase negative staph infection we treated with vancomycin q.8 h.  We  premedicated with Atarax for that to avoid [place].  Prior to discharge the  sensitivities came back on the coag negative staph at which point we changed  to levofloxacin to the finish 14 day course pass the last negative culture,  which was drawn on the 4th, thus he completes his course on April 10, 2006.  The coag negative staph was resistant to erythromycin,  sensitive to  gentamicin, levofloxacin.  Resistant to oxacillin.  Resistant to penicillin.  Sensitive to rifampin, vancomycin and tetracycline.  Prior to determining  the culture results the patient was also treated with ceftriaxone IV q.24 h.  The fever was controlled with Tylenol or Motrin p.r.n., and also upon  getting results a TSH of 0.014, his Synthroid was decreased from the 75 mcg  dose to 50 mcg daily.  A free T4 was ordered and is pending.   OPERATIONS AND PROCEDURES:  None.   FINAL DIAGNOSES:  1.  Coagulase negative staphylococcal line infection.  2.  Seizure disorder with clinical worsening.  3.  Hypothyroidism.  4.  Girth delay.  5.  MR.  6.  Brown syndrome.  7.  Mitral valve prolapse.  8.  Muscular ventricular septal defect.   DISCHARGE MEDICATIONS AND INSTRUCTIONS:  1.  Tegretol 250 mg p.o. at 7 a.m. and 1:30 p.m., 300 mg at  9 p.m.  2.  Potassium chloride 20 mEq p.o. at 7 a.m., 1:30 p.m., 5 p.m. and 9 p.m.  3.  Topamax 150 mg p.o. at 7 a.m., 1:30 p.m., 9 p.m.  4.  Keppra 1250 mg p.o. at 7 a.m., 9 p.m.  5.  K-Phos 500 mg p.o. at 7 a.m., 1:30 p.m., 5 p.m., 9 p.m.  6.  Clarinex 5 mg p.o. at 9 p.m.  7.  Primidone 125 mg p.o. at 7 a.m., 250 mg p.o. at 9 p.m.  8.  Synthroid 50 mcg p.o. q.d.  9.  Levofloxacin 500 mg IV q.d., first dose at 1500.   PENDING RESULT ISSUES TO BE FOLLOWED:  1.  Final read on blood culture drawn on March 27, 2006.  2.  Free T4.   CONDITION ON DISCHARGE:  On the day of discharge patient was afebrile.  Vital signs were within normal limits.  On physical exam, he was alert and  it was unchanged.  Cardiovascular remained unchanged.  Respiratory exam  remained unchanged.  Abdominal exam remained unchanged.  Neuro exam was  within normal limits.  It was nonfocal.  Patient was at his baseline.  He  continued to have clumsy fine motor movements.  There were no evidence of  seizure.  The patient was eating well and voiding well.  Prior to discharge   the patient received his vancomycin dose at 7 a.m. with followup  levofloxacin to be given by home health at 3 p.m.  Overall upon discharge,  his condition was stable and improved.   DISCHARGE WEIGHT:  36.7 kilograms.           ______________________________  Drue Dun, M.D.     EE/MEDQ  D:  03/30/2006  T:  03/30/2006  Job:  [id number]

## 2011-02-07 NOTE — H&P (Signed)
[place]. [facility]  Patient:    [name], [name]                      MRN: [id number] Adm. Date:  [id number] Attending:  Lorain Childes B                         History and Physical  DATE OF BIRTH:  July 25, 1986  CHIEF COMPLAINT: This is one of multiple [facility] admissions for this 25 year old right handed white male from [place], [place] admitted from the emergency room for evaluation of intractable seizures.  HISTORY OF PRESENT ILLNESS:  Details of this patients admission history and physical examination have been dictated previously.  This patient has a long history of intractable cluster seizures and initially possibly made worse by the use of Depakote and because of this a question of a mitochondrial encephalopathy was raised.  He has also had evidence of mental retardation and developmental delay.  He has been seen by Dr. Mignon Pine in [facility] for evaluation of his seizures and suspected to have an inflammatory cause because of the spinal fluid examination showing CSF pleocytosis.  He has been treated with IV IgG through a [name], the last time approximately nine days ago. He is currently on Keflin 1000 mg b.i.d., Tegretol 150 mg, 200 and 250 in a t.i.d. schedule, Topamax 75, 75, 75 on a t.i.d. schedule and Synthroid 0.075 mg q.d.  He has episodes of cluster seizures occurring approximately once per month usually 10 to 15, relieved usually with the use of 1 mg and at times a repeat 1 mg of IV Ativan dosage.  The evening prior to admission, about 10:30 he developed approximately 10 to 15 of his seizures characterized by right hand and arm drawing, and head and eyes turning to the right.  He was seen in the emergency room, received 1 mg of Ativan, had one or two other seizures and then the seizures stopped.  He was sent home but approximately 5:45 a.m. this morning he had recurrent seizures and was brought to  the emergency room having approximately 16.  He has had no associated fever with this or generalized major motor seizure, nor has he had any falls or injuries.  PAST MEDICAL HISTORY:  Significant for developmental delay.  Mental retardation and hypothyroidism.  MEDICATIONS:  Listed above.  ALLERGIES:  He has a history of allergy to Depakote because of worsening while on this medication.  History of worsening of his seizures while on Dilantin.  SOCIAL HISTORY:  He is in the 5th grade but able to perform only first grade reading and writing skills.  FAMILY HISTORY:  Has been dictated elsewhere.  PHYSICAL EXAMINATION:  GENERAL:  Well-developed, thin, white male who appeared younger than his stated age.  There were no bruits.  VITAL SIGNS:  Blood pressure 101/57, heart rate 69, temperature 97.3, heart rate 110.  NEUROLOGIC:  He was lethargic.  He would give his name and would follow some commands.  His cranial nerve examination revealed a wide ptosis.  Both disks were seen and were flat.  He could count fingers right and left eyes.  Pupils reactive from 4 to 3 bilaterally.  He had decreased left lateral eye movement. 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  The tongue was midline.  Gags were present. Motor examination revealed diffuse weakness in the upper and lower extremities with absent reflexes in  the upper and lower extremities.  HEENT:  Tympanic membranes to be clear.  LUNGS:  Clear.  HEART:  He had a loud 4/6 holosystolic murmur.  ABDOMEN: There was no enlargement of the liver, spleen or kidneys.  PENDING STUDIES:  Tegretol level from the evening prior to this evaluation.  IMPRESSION: 1. Cluster seizures with simple and complex partial type intractable.    Code 345.40 and 345.50. 2. Mental retardation. 3. Developmental delay.  PLAN: At this time is to admit the patient for observation and use of IV Ativan. DD:  07/28/00 TD:  07/28/00 Job: [id number]

## 2011-02-07 NOTE — Discharge Summary (Signed)
NAMECORRAN, LALONE 111                     ACCOUNT NO.:  [id number]   MEDICAL RECORD NO.:  [id number]                   PATIENT TYPE:  INP   LOCATION:  6121                                 FACILITY:  [facility]   PHYSICIAN:  Deanna Artis. Sharene Skeans, M.D.           DATE OF BIRTH:  [birth year]   DATE OF ADMISSION:  05/09/2004  DATE OF DISCHARGE:  05/10/2004                                 DISCHARGE SUMMARY   FINAL DIAGNOSES:  1. Intractable simple and complex partial seizures with secondary     generalization -- 345.51; 345.41; 345.11.  2. Mental retardation -- 317.  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  4. Brown syndrome.  5. Hypokalemia secondary to Topamax.   PROCEDURES:  None.   COMPLICATIONS:  None.   SUMMARY OF THE HOSPITALIZATION:  [name] was admitted with a flurry of 2  distinct groups of seizures that were large simple partial, some with  complex partial changes.  There may have been 1 or 2 secondarily generalized  seizures.   He received phenobarbital 60 mg, Ativan 2 mg IV at home around 3:15 in the  morning and then again at the hospital on arrival at the emergency room.  He  continued to have simple partial seizures involving the right side that were  both somatosensory and focal motor.  He had over 50 before the symptoms  stopped at 4:20 p.m.  We decide not to continue giving him phenobarbital and  Ativan because of concerns over embarrassment of his airway.   He has had no further seizure since that time.  This morning, he is sleepy  but arousable.  Review of his laboratory shows a carbamazepine non-trough  level of 12.6 and a potassium of 3.3.   EXAMINATION:  VITAL SIGNS:  Temperature 36.8, blood pressure 87/54, resting  pulse 72, respirations 20, pulse oximetry 100% on room air.  EARS, NOSE AND THROAT:  No infection.  LUNGS:  Clear.  HEART:  Heart shows a holosystolic murmur at the left sternal border (VSD).  EXTREMITIES:  His extremities are thin.  He has tight heel  cords.  NEUROLOGIC:  Mental status:  He awakens and follows commands.  He has mental  retardation and is a little bit sleepy this morning.  Cranial nerves:  Mild  disconjugate movement of his eyes.  Visual fields full.  Symmetric facial  strength.  Midline tongue.  Motor examination:  Near-normal strength, no  focality, clumsy fine motor movements.  Sensory examination:  Mild  peripheral polyneuropathy, intact stereoagnosis.  Cerebellar examination:  Clumsy without tremor or dysmetria.  Gait not tested.  He is areflexic.  He  has bilateral flexor plantar responses.   DISCHARGE MEDICATIONS:  Plan is to discharge the patient home on his current  medications which include:  1. Primidone 250 mg one-half -- 0700, one at 2100.  2. Keppra 500 mg two and half -- 0700 and 2100.  3. Topamax 100 mg one and  a half -- 0700, 1330 and 2100.  4. Tegretol 100 mg/250 mg at 0700 and 1330; 300 mg at 2100.  5. Synthroid 75 mcg -- 0700.  6. Potassium chloride 20 mEq -- 0700, 1330, 1700 and 2100.   ACTIVITY:  He should return to his activities as tolerated.   DIET:  We should continue to supplement potassium in his diet with citric  foods.   FOLLOWUP:  He should return to see me in 3 months time, telephone number [phone].   COMMENT:  The patient had his vagal nerve stimulator evaluated yesterday to  make certain that it was functioning; it is and the patient has not been  drawn down.  [name] claims to not be feeling the device when it fires.   OTHER LABORATORIES:  Urinalysis:  Specific gravity 1.018, pH 7.0.  Chemistries negative.  Phenobarbital 21.4.  PCO2 28.5, pH 7.37, PO2 138,  bicarbonate 16.5.  Sodium 136, potassium 3.4, chloride 113, CO2 17, glucose  102, BUN 6, creatinine 0.7, bilirubin 0.6, alkaline phosphatase 118, SGOT  23, SGPT 23, total protein 6.7, albumin 3.5, calcium 8.7.  White count 3800,  hemoglobin 12.2, hematocrit 35.5, MCV 77.7, platelet count 266,000;  neutrophils 58,  lymphocytes 37, monocytes 5, basophils 1, absolute  neutrophils 2200.  Carbamazepine 12.6.   CONDITION ON DISCHARGE:  The patient is discharged in improved condition.                                                Deanna Artis. Sharene Skeans, M.D.    [name]  D:  05/10/2004  T:  05/11/2004  Job:  [id number]   cc:   Fonnie Mu, M.D.  Fax: [phone]

## 2011-02-07 NOTE — Discharge Summary (Signed)
[place]. [facility]  Patient:    [name], [name] Visit Number: [id number] MRN: [id number]          Service Type: MED Location: PEDS 6121 01 Attending Physician:  Enos Fling Dictated by:   Marlan Palau, M.D. Admit Date:  10/15/2001 Discharge Date: 10/16/2001                             Discharge Summary  ADMISSION DIAGNOSES: 1. History of intractable seizures with recurrence of the seizures. 2. Mild mental retardation. 3. Hypokalemia. 4. Developmental delay.  DISCHARGE DIAGNOSES: 1. Intractable seizures with recent recurrence. 2. Developmental delay. 3. Mild mental retardation. 4. Hypokalemia.  PROCEDURES DURING THIS ADMISSION:  None.  COMPLICATIONS:  None.  HISTORY OF PRESENT ILLNESS:  [name] is a 25 year old white male born 07/15/1986 with a history of recurrent complex partial seizures, intermittent episodes of ______.  The patient has required multiple ER evaluations and admissions for the seizures.  The patient has been receiving Diastat 10 mg rectally at home and has received Ativan and phenobarbital IV in the emergency room.  No further seizures were noted following the above injections.  The patient was admitted for observation and treatment.  The patient has been placed on Trileptal and was gradually going up on his dose.  PAST MEDICAL HISTORY: 1. Developmental delay, growth hormone deficient, hypothyroidism. 2. Mild mental retardation. 3. Refractory seizures. 4. History of venous stimulator placement. 5. History of [name] placement.  CURRENT MEDICATIONS: 1. Topamax 125 mg p.o. t.i.d. on admission 2. Keppra 1250 mg b.i.d. 3. Trileptal 300-450 mg. 4. Potassium chloride 20 mEq b.i.d. 5. Synthroid 0.075 mg daily.  ALLERGIES: 1. DILANTIN. 2. DEPAKOTE.  Please refer to history and physical dictation for social history, family history, review of systems, and physical examination.  LABORATORY  DATA:  Sodium 140, potassium 3.4, chloride 110, BUN 14, glucose 101.  Hematocrit 33, hemoglobin 11.  Creatinine 0.5.  AST 22, ALT 19, alkaline phosphatase 185, total bilirubin 0.4.  White count 4.3, platelets 219.  HOSPITAL COURSE:  The patient was admitted to [facility] and was treated with IV Ativan and phenobarbital, as above.  The patient has done well, was very drowsy and sleepy on the day of admission.  He has been followed overnight and has well.  He is back to his baseline.  The patient will be discharged to home with a gradually increasing regimen of Trileptal. The patient will go to 450 mg of Trileptal in the morning and evening, 300 mg at midday for four days, and then go to 450 mg three times a day.  The patient will remain on Keppra 1250 mg one twice a day, Topamax 125 mg one three times a day, Synthroid 0.075 mg daily, potassium chloride 20 mEq three times a day, Diastat 10 mg per rectum as needed.  The patient is to follow up with Dr. Sharene Skeans following discharge. Dictated by:   Marlan Palau, M.D. Attending Physician:  Enos Fling DD:  10/16/01 TD:  10/17/01 Job: [id number]

## 2011-02-07 NOTE — Discharge Summary (Signed)
[place]. [facility]  Patient:    [name], [name]                      MRN: [id number] Adm. Date:  [id number] Disc. Date: 10/04/00 Attending:  Mick Sell                           Discharge Summary  FINAL DIAGNOSES: 1. 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 2. Mental retardation. 3. 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 5. Ventricular septal defect. 6. Lack of expected physiologic development, code 783.4.  HOSPITAL COURSE:  The patient received three doses of IV Ig 15 g q.12h., and tolerated them well.  He has had no seizures in the hospital.  His seizure frequency has continued to be about 5 isolated seizures per month, at least one, sometime two, cause either hospital emergency room visits or hospitalizations.  PLAN:  The patient will go home at completion of his IV Ig.  He will receive one more infusion of IV Ig on February 9 and November 01, 2000.  If no response, he will be sent to [facility] probably the first week in April 2002, if this can be arranged, for prolonged video EEG.  We will consider a vagal nerve stimulator implantation and also discuss ______ ogenic diet.  He will continue his current medications which include Topamax 125 mg t.i.d., Kepra 1000 mg b.i.d., Tegretol 150 mg q.a.m., 200 mg at midday, 250 mg at nighttime, Synthroid 0.075 mg q.d.  Return to school on Monday.  FOLLOWUP:  Return to see me during his next hospitalization.  DIET:  Regular.  No other restrictions. DD:  10/04/00 TD:  10/04/00 Job: [id number]

## 2011-02-07 NOTE — Discharge Summary (Signed)
[place]. [facility]  Patient:    [name], [name] Visit Number: [id number] MRN: [id number]          Service Type: PED Location: PEDS [phone] Attending Physician:  Melvyn Novas Dictated by:   Melvyn Novas, M.D. Admit Date:  01/14/2002 Discharge Date: 01/15/2002                             Discharge Summary  ADMISSION ADMISSION AND DISCHARGE OBSERVATION NOTE:  DATE OF BIRTH: 03-Dec-1985  HISTORY OF PRESENT ILLNESS: This is a 25 year old admitted today under my service, Dr. Porfirio Mylar Dohmeier on January 14, 2002. Ranjit Roosevelt is a well known patient to the Neurology Service, who presented tonight in the early morning hours to the ER for recurrent "sensory seizures", as his father described it. Ayo has a long history of intractable partial seizure disorder with secondary generalization and was in the past, treated for [facility] encephalitis. At the time of evaluation in the ER, he received 60 gram of phenobarbital and 2 mg of Ativan IV and became very lethargic but had no focal findings apart from his baseline neurologic status. His father explained that [name] has three types of seizures and has been treated since infancy with a variety of anti-epileptic drugs. He has a Perma-Cath implanted for his frequency IV intervention and via as has been discussed.  FAMILY HISTORY: Taken from Dr. Gerald Leitz records. The patient lives with his parents. The patient does not smoke. The patient attends [employer] and is in the fifth grade.  SOCIAL HISTORY: Taken from Dr. Gerald Leitz records.  PAST MEDICAL HISTORY: 1. 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 2. History of developmental delay. 3. Failure to thrive. 4. Mild mental retardation. 5. 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 6. History of implantation of the Perma-Cath.  REVIEW OF SYSTEMS: Notable for  some stomach irritation with abdominal tenderness when admitted to the ER but now in my presence, no longer tender. The patient denies headache, breathing problems, or chest pain but he and his father state that he had tingling in the face, arms, and legs. The patient describes a crawling sensation.  PHYSICAL EXAMINATION:  VITAL SIGNS: Blood pressure 105/53. Temp 97.8. Heart rate 102 in the ER initially and now 110. Respiratory rate is only 18.  GENERAL: The patient is somnolent. More for age. White male who is cooperative and easy to arouse but difficult for him to stay alert post medication effects.  HEENT: Atraumatic. PERRLA. 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 Tongue and uvula are midline. There is no touch from nose or ear. No sore throat. No lymph node enlargement.  NECK: Supple.  CHEST: Clear to auscultation.  HEART: Regular rate and rhythm. There is a systolic murmur that is reminiscent of a hissing sound in the left subclavicular area.  NEUROLOGIC: The patient answers to questions. Alert but no convulsions seen. Cranial nerves 2-12 equal to light. Extraocular movements are intact. Strabism. No facial weakness. Prefers his head tilted to the left but has no spastic component when passively moved to the right.  EXTREMITIES: Motor is 5/5 strength with individual muscle groups. Upper and lower extremity equal coordination. The patient is not able to perform a finger-to-nose finger  test but a finger-to-nose on the left is slowed. On the right, he shows a mild tremor and ataxia. Deep tendon reflexes were bilaterally equal. He has no Babinski. The patient has stabilized stance. Gait was deferred due to his IV and monitoring equipment. Good strength bilaterally equal. He follows commands with all four extremities, wiggles toes, etc. Sensory exam was  intact to fine touch, pin prick pressure, and two point discrimination, vibration, and temperature.  PLAN: We will admit to the observation unit in the pediatric floor, sixth floor, [facility] for a 23 hour stay. 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 We will rule out an infection. Also his temperature is not elevated. The patient has tachycardia. A metabolic panel and drug levels are pending. We will admit for telemetry observation with vital checks and neuro signs scheduled. The plan is to give phenobarbital and IV for stabilization and continue all the drugs as soon as the patient is alert enough to take such as Tegretol. Diastat will be used if IV access cannot be maintained. Otherwise, Ativan IV 2 mg and keep on exact time for dosing as at home. The Synthroid that the patient takes at home as well as the potassium chloride will also have to be supplemented, as used at home. We will discharge when stabilized with a refill Diastat, as the patient was using this at home.  CURRENT MEDICATIONS: 1. Tegretol 200 mg three times daily and 300 mg at night. 2. Capra 1.25 grams t.i.d. 3. KCL 20 meq t.i.d. 4. Topuramide 125 mg t.i.d. 5. Diastat p.r.n. 20 mg rectal. 6. Tylenol p.r.n. 650 mg p.o. for fever. 7. Synthroid 0.37 mcg p.o. q.d. Dictated by:   Melvyn Novas, M.D. Attending Physician:  Melvyn Novas DD:  01/14/02 TD:  01/17/02 Job: [id number]

## 2011-02-07 NOTE — Consult Note (Signed)
[place]. [facility]  Patient:    [name], [name]                      MRN: [id number] Proc. Date: 09/22/00 Adm. Date:  [id number] Disc. Date: [id number] Attending:  Mick Sell CC:         [facility], [address].   Consultation Report  HISTORY OF PRESENT ILLNESS:  The patient is a 25 year old white male born Aug 19, 1986, with a history of a seizure disorder of unclear etiology. This patient has frequent seizures that often times come in flurries.  The patient makes frequent trips to the emergency room for the Ativan.  The patient has had at least three or four seizure episodes this morning and comes in to the emergency room for medication.  PAST MEDICAL HISTORY: 1. History of seizure disorder, etiology unclear.  The patient currently is    receiving an IVIG protocol for this. 2. History of developmental delay and growth hormone deficiency. 3. History of hypothyroidism.  CURRENT MEDICATIONS: 1. Synthroid 0.075 mg daily. 2. Keppra 1000 mg b.i.d. 3. Tegretol 150 mg in the morning, 200 mg midday, and 250 mg in the evening. 4. Topamax 125 mg t.i.d.  ALLERGIES:  DILANTIN and DEPAKOTE.  PAST SURGICAL HISTORY:  The patient has a history of eye surgery for Brown syndrome and has had a [name] placement.  SOCIAL HISTORY:  The patient lives with his parents in the [place] area. The patient is in the fifth grade.  FAMILY HISTORY:  Family medical history notable for no problems with seizures. Sister has migraine headaches.  REVIEW OF SYSTEMS:  Notable for no recent fever or chills.  The patient has had a cold without fevers for the last couple of weeks and has not completely cleared from this.  No skin rashes, diarrhea, or other medical illnesses recently.  PHYSICAL EXAMINATION:  GENERAL:  The patient is alert and cooperative.  HEENT:  Head is atraumatic.  Eyes:  Pupils are equal, round, and reactive  to light.  LUNGS:  The patient has clear lung fields.  CARDIOVASCULAR:  Regular rate and rhythm without obvious murmurs or rubs.  EXTREMITIES:  Without significant edema.  NEUROLOGIC:  Notable in that the patient has good deep tendon reflexes.  Toes are neutral bilaterally.  The patient has good finger-to-nose-finger and toe-to-finger bilaterally.  Good strength noted throughout.  Speech is fairly well enunciated.  IMPRESSION: 1. History of chronic seizure disorder with recent recurrence. 2. Developmental delay.  This patient usually responds quite well to low-dose IV Ativan.  The patient has gotten 1 mg IV Ativan at this time.  Will observe for two hours and then send the patient home if he is doing well.  The patient is to continue on his current medication regimen. DD:  09/22/00 TD:  09/22/00 Job: [id number]

## 2011-02-07 NOTE — Discharge Summary (Signed)
NAME:  [name], [name]               ACCOUNT NO.:  [id number]   MEDICAL RECORD NO.:  [id number]          PATIENT TYPE:  INP   LOCATION:  6150                         FACILITY:  [facility]   PHYSICIAN:  Orie Rout, M.D.DATE OF BIRTH:  [birth year]   DATE OF ADMISSION:  06/19/2005  DATE OF DISCHARGE:  06/20/2005                                 DISCHARGE SUMMARY   REASON FOR ADMISSION:  Break through seizures in a patient with chronic  seizure disorder.   HOSPITAL COURSE:  This is a 26 year old male with a history of a seizure  disorder since age 14 as well as hypothyroidism with hypopituitary and  ventricular septal defect, mitral valve prolapse, growth disorder, Brown's  syndrome, and mild mental retardation, who was admitted for seizures that  began early in the morning on the day of admission.  The patient's family  says that he missed his nighttime dose of anti-seizure medication and the  seizures began about four hour later.  They said he is very sensitive to  medication.  They also described the patient as turning blue and becoming  apneic for a period of about 20-30 seconds during one of his seizures that  worried them enough to bring them in.  At home, he had been given Valium  that did not break the seizures.  When he came into the emergency room, he  was loaded with Phenobarbital and 2 mg Ativan.  He did not have any seizures  since the one that he had in the ED.  The patient was admitted and observed  overnight without any further seizures.  Neurology was consulted and saw the  patient in the ER and on the ward, they recommended discharging him on his  normal medication regimen and following up as usual.   LABORATORY DATA:  The patient's Tegretol level was 12.1.  White blood cell  count 4.  Potassium on admission 2.9, went up to 3.2.  Blood culture no  growth for 24 hours.   DISCHARGE MEDICATIONS:  1.  Tegretol 250 mg t.i.d. and 300 mg once daily.  2.  Topamax 150 mg  t.i.d.  3.  Keppra 1250 mg daily.  4.  Synthroid 75 mcg daily.  5.  Clarinex 125 mg once daily.  6.  Klor-Con 20 mEq q.i.d.  7.  K-Phos 500 mg q.i.d.   DISCHARGE WEIGHT:  37.2 kilograms.   CONDITION ON DISCHARGE:  Improved and stable.   FOLLOW UP:  The patient is to take all medications as previously prescribed.  He is to follow up with Dr. Sharene Skeans at his regularly scheduled appointment  time.     ______________________________  Pediatrics Resident    ______________________________  Orie Rout, M.D.    PR/MEDQ  D:  06/20/2005  T:  06/20/2005  Job:  [id number]

## 2011-02-07 NOTE — Op Note (Signed)
NAME:  [name], [name] NO.:  [id number]   MEDICAL RECORD NO.:  [id number]          PATIENT TYPE:  INP   LOCATION:  6148                         FACILITY:  [facility]   PHYSICIAN:  Deanna Artis. Hickling, M.D.DATE OF BIRTH:  02-Dec-1985   DATE OF PROCEDURE:  03/27/2006  DATE OF DISCHARGE:                                 OPERATIVE REPORT   INDICATIONS FOR PROCEDURE:  Intractable generalized seizures, 345.11  (secondarily generalized).   PROCEDURE:  Reprogramming of vagal nerve stimulator.   DESCRIPTION OF PROCEDURE:  The device was interrogated and showed a  stimulation amplitude of 0.75 milliamps, frequency 30 hertz, pulse width 250  milliseconds.  Stimulation duration was 30 seconds.  Time between stimuli  was 3 minutes.  Magnet current was 1.00 milliamps.  Duration of magnet  stimulation was 60 seconds.  Magnet pulse width 250 microseconds.   The device was reprogrammed to a current of 1.0 milliamps.  The magnet  current was 1.25 milliamps.  The time between stimuli was 1.8 minutes.  All  other parameters were left unchanged.   The patient tolerated the procedure well.      Deanna Artis. Sharene Skeans, M.D.  Electronically Signed     [facility]/MEDQ  D:  03/27/2006  T:  03/27/2006  Job:  [id number]

## 2011-02-07 NOTE — Discharge Summary (Signed)
NAME:  [name], [name] NO.:  [id number]   MEDICAL RECORD NO.:  [id number]          PATIENT TYPE:  INP   LOCATION:  6151                         FACILITY:  [facility]   PHYSICIAN:  Deanna Artis. Hickling, M.D.DATE OF BIRTH:  1986/07/03   DATE OF ADMISSION:  01/01/2006  DATE OF DISCHARGE:  01/02/2006                                 DISCHARGE SUMMARY   HISTORY OF PRESENT ILLNESS:  The patient is an 25 year old male with a  history of seizure disorder, who is admitted with break-through seizures.  The patient received Phenobarbital and Ativan in the emergency room on  arrival, and has had no seizures since receiving these medications.   HOSPITAL COURSE:  The patient was admitted for observation overnight, and  has had no seizures since then.  The patient remained afebrile during his  stay.  The blood culture was drawn from the [name] in the emergency  department and was negative at 24 hours.   OPERATION/PROCEDURE:  None.   DISCHARGE DIAGNOSIS:  Seizure.   DISCHARGE MEDICATIONS:  1.  Tegretol 250 mg p.o. q. 7 a.m. and q. 1:30 p.m., 300 mg q. 9 p.m.  2.  Topamax 150 mg p.o. t.i.d. at 7 a.m., 1:30 p.m. and 9 p.m.  3.  Keppra 1250 mg p.o. b.i.d. at 7 a.m. and at 9 p.m.  4.  Synthroid 75 mcg p.o. q. 7 a.m.  5.  Primidone 125 mg p.o. q. 7 a.m. and 250 mg p.o. q. 9 p.m.  6.  Clarinex 5 mg p.o. q. 8 p.m.  7.  Klor-Con-S 20 mEq p.o. q.i.d. at 7 a.m., 1:30 p.m., 5 p.m. and 9 p.m.  8.  K-Phos 500 mg p.o. q.i.d. at 7 a.m., 1:30 p.m., 5 p.m. and 9 p.m.   DISCHARGE WEIGHT:  Was 39 kg.   CONDITION ON DISCHARGE:  Stable.   DISCHARGE INSTRUCTIONS:  Please follow up with Dr. Deanna Artis. Hickling on  Tuesday, January 06, 2006, as previously scheduled.    ______________________________  Pediatrics Resident      Deanna Artis. Sharene Skeans, M.D.  Electronically Signed   PR/MEDQ  D:  01/02/2006  T:  01/02/2006  Job:  [id number]

## 2011-02-07 NOTE — H&P (Signed)
NAME:  [name], [name] NO.:  [id number]   MEDICAL RECORD NO.:  [id number]                    PATIENT TYPE:   LOCATION:                                       FACILITY:   PHYSICIAN:  Gustavus Messing. Orlin Hilding, M.D.          DATE OF BIRTH:   DATE OF ADMISSION:  DATE OF DISCHARGE:                                HISTORY & PHYSICAL   CHIEF COMPLAINT:  Seizure.   HISTORY OF PRESENT ILLNESS:  This is one of numerous frequent [facility] hospitalizations for this 25 year old boy with intractable  seizures. He  is now on Tegretol, Mysoline, Keppra and Topamax and has a  vagal nerve stimulator. Despite the vagal nerve stimulator and quadruple  therapy, he frequently presents  in focal or generalized status with a  flurry of seizures. This occurred early this morning with a 20 to 30 minute  status episode, arrested quickly by the Ativan and phenobarbital on arrival  to the ER. He  has had a recent mild upper respiratory infection and just  started school.   REVIEW OF SYSTEMS:  Positive for the URI.   PAST MEDICAL HISTORY:  1. Significant for endocrinopathies.  2. Intractable seizure disorder.  3. Delayed physical development.  4. Hypokalemia.  5. Down syndrome.  6. Mitral valve prolapse.   MEDICATIONS:  1. Primidone.  2. Topamax.  3. Keppra.  4. Tegretol.  5. Synthroid.   ALLERGIES:  1. Dilantin.  2. Depakote.   SOCIAL HISTORY:  Lives with his parents and one sister.   FAMILY HISTORY:  Please refer to the previous charts.   PHYSICAL EXAMINATION:  VITAL SIGNS:  Temperature 97.2, pulse 104,  respirations 21, blood pressure 107/63.  GENERAL:  He is mildly dysmorphic, small stature  for age. He is very sleepy  now, status post the phenobarbital and Ativan  that he received in the  emergency room. However, he will arouse to follow commands and  moves all  four extremities with good grips.  HEENT:  Extraocular movements full. Pupils equal  and reactive. There is no  facial asymmetry. Tongue was midline.  NEUROLOGIC:  Motor exam as noted, moves all four extremities. Deep tendon  reflexes are diminished, downgoing toes on the right, equivocal on the left.  He is otherwise cooperative with sedation.   LABORATORY DATA:  White blood cell count 4.5, which is minimally low,  hemoglobin 11.8, hematocrit 33.5, platelets 280. Sodium 136, potassium  slightly low at 3.2, chloride 108 and CO2 21, creatinine 0.7, BUN 16,  glucose  126.  Calcium 8.2. Tegretol level is 9.5. Phenobarbital levels from  the Mysoline are pending.   IMPRESSION:  Breakthrough seizures.   RECOMMENDATIONS:  Check Mysoline levels. Replete the potassium. Should he be  on oral  replacement. Will notify Dr. Sharene Skeans of admission.  Catherine A. Orlin Hilding, M.D.    CAW/MEDQ  D:  05/26/2002  T:  05/27/2002  Job:  [id number]

## 2011-02-07 NOTE — Discharge Summary (Signed)
NAME:  [name], [name]                      ACCOUNT NO.:  [id number]   MEDICAL RECORD NO.:  [id number]                   PATIENT TYPE:  INP   LOCATION:  6118                                 FACILITY:  [facility]   PHYSICIAN:  Deanna Artis. Sharene Skeans, M.D.           DATE OF BIRTH:  [birth year]   DATE OF ADMISSION:  06/26/2002  DATE OF DISCHARGE:  06/28/2002                                 DISCHARGE SUMMARY   FINAL DIAGNOSIS:  1. 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  2. Probable developmental delay with hypothyroidism and hyperpituitarism.  3. Ventricular septal defect.  4. Brown's syndrome.  5. Gastroenteritis.   PROCEDURE:  None.   COMPLICATIONS:  None.   SUMMARY OF THE HOSPITALIZATION:  This is a 25 year old young man who has  been admitted numerous times for intractable and prolonged seizures.   The patient had bronchitis several days prior to admission and was treated  with Zithromax. The patient had intermittent vomiting and diarrhea with  fever. The patient had a cluster of seizures, up to 14 in one hour.  According to the patient's father's history, he had two 9-minute seizures  that were separated by about 15 minutes a piece without gaining level of  consciousness.   The patient's parents had been able to get medication into him. This was  reflected in the drug levels that were obtainable in the emergency room.   The patient on admission had stable vital signs. He was drowsy and having  nonfocal neurological examinations.   The patient recovered and was basically over his prolonged seizure disorder  on day #2 of hospitalization; however, diarrhea continued, and he had not  taken any solids.   We placed him on a BRAT diet which he tolerated. He has had no diarrhea, no  vomiting, and no seizures.   PHYSICAL EXAMINATION:  VITAL SIGNS:  Temperature 97.0, blood pressure  100/52, resting pulse 70, respirations 24, pulse oximetry 99% on room  air.  HEAD, EYES, EARS, NOSE, AND THROAT:  No signs of infection. Supple neck,  full range of motion. No cranial or cervical bruits.  LUNGS:  Clear to auscultation.  HEART:  Pulse is normal. The patient has a holosystolic murmur at the left  sternal border which is stable.  ABDOMEN:  Soft, nontender, bowel sounds normal.  EXTREMITIES:  Well formed without edema, cyanosis, alterations in tone, or  tight heel cords.  NEUROLOGICAL:  Mental status:  The patient is awake, alert, attentive. He  has mild mental retardation. Cranial nerves:  Round and reactive pupils.  Visual fields full. Extraocular movements  full and conjugate. Symmetrical  in strength. Midline tongue and uvula. Motor examination:  Normal strength  and tone, slight decreased mass, sensation intact to full vibration and  stereognosis. Cerebellar examination:  Good finger-to-nose, rapid  alternating movements. Gait and station is not tested. Reflexes were absent.   PLAN:  The patient will be discharged home on the  following medications:  Tegretol 250 mg in the morning and afternoon and 300 mg at night time,  Topamax 125 mg three times a day, Keppra 1,250 mg twice a day, Synthroid  0.075 mg per day, potassium 20 mEq five times a day, primidone 50 mg twice a  day, azithromycin was started but has been discontinued in the hospital.   This morning, carbamazepine, Primidone, and phenobarbital levels were drawn  and are pending. The patient will go home on a bland diet and crossover to  regular diet as tolerated. We will review his laboratory studies carried out  today and decide on whether adjustment of his medications are warranted. He  will return to see me in at regular scheduled time in my office. We did not  change his vagal-nerve stimulator as it was recently modified, and it is  near maximum levels of stimulation.                                               Deanna Artis. Sharene Skeans, M.D.    [facility]  D:  06/28/2002  T:   06/30/2002  Job:  [id number]   cc:   Fonnie Mu, M.D.

## 2011-02-07 NOTE — Discharge Summary (Signed)
NAMEALESSANDER, [name] 111                     ACCOUNT NO.:  [id number]   MEDICAL RECORD NO.:  [id number]                   PATIENT TYPE:  INP   LOCATION:  6121                                 FACILITY:  [facility]   PHYSICIAN:  Deanna Artis. Sharene Skeans, M.D.           DATE OF BIRTH:  Feb 13, 1986   DATE OF ADMISSION:  12/22/2002  DATE OF DISCHARGE:  12/23/2002                                 DISCHARGE SUMMARY   FINAL DIAGNOSIS:  Recurrent breakthrough seizures, complex-partial with  secondary generalization, 345.41, 345.11.   PROCEDURE:  None.   COMPLICATIONS:  None.   SUMMARY OF THE HOSPITALIZATION:  The patient is a 25 year old young man with  static encephalopathy of unknown etiology who has intractable seizures that  involve simple partial seizures with sensory symptomatology, complex-partial  seizures that arouse him from sleep and associated with staring and partial  seizures secondary to generalization where he has staring which evolves to  tonic posturing and sometimes tonic clonic activity.   The patient has been averaging about one seizure per night for the last  week. He has actually not been in the hospital since late 2/04 which is the  best that he has done in a while.  He had a series of four seizures at  school and was brought to the emergency room  where he was treated with  Ativan 2 mg, phenobarbital 60 mg and had two more brief seizures lasting  about a minute apiece until the medication suppressed his seizures.   CURRENT MEDICATIONS:  1. Tegretol 100 mg 2 1/2 in the morning, 2 1/2 at 1 p.m., 3 at bedtime.  2. Topamax 100 mg one in the morning, one at midday, 1 1/2 at nighttime.  3. Topamax 25 mg one in the morning, one at midday.  4. Keppra 500 mg  2 1/2 twice daily.  5. Mysoline 250 mg one 1/2 in the morning and 1 at nighttime.  6. Synthroid 75 mcg one per day.  7. K-Dur 20 mEq four times a day.   In addition, the patient has [name] placement and a vagal  nerve  stimulator. The stimulator is up near the maximum settings.   ALLERGIES:  INTOLERANCES TO DILANTIN WHICH WORSENS IN SEIZURES AND DEPAKOTE  WHICH CAUSED SIGNIFICANT MULTISYSTEM ORGAN FAILURE.   PAST MEDICAL HISTORY:  The patient has cataracts, Brown syndrome,  ventriculoseptal defect and Topamax induced hypokalemia.   The patient had been basically well without symptoms other than tingling in  the feet which is the Topamax effect.   He has had no seizures since admission to the hospital.   This morning  he is drowsy, but arousable and cooperative.   PHYSICAL EXAMINATION:  VITAL SIGNS:  Temperature 97.1, pulse 75,  respirations 28, blood pressure 93/52 in his right arm. O2 saturation 100%.  HEENT:  No signs of infection.  LUNGS:  Clear.  HEART:  Shows soft holosystolic murmur at the left sternal border  with loud  S2.  ABDOMEN:  Soft, nontender.  Bowel sounds normal.  EXTREMITIES:  Tight heel cords bilaterally and are somewhat thin.  Pulses  were normal.  Capillary refill is normal.  NEUROLOGIC:  Mental status:  The patient is drowsy, but follows commands.  His speech is mildly slurred.  Cranial nerves:  Round, reactive pupils.  The  patient has bilateral esotropia.  He has small cataracts.  Visual fields are  full.  Symmetric facial strength.  Midline tongue.  Air conduction greater  than bone conduction.  Motor:  The patient showed near normal strength in  his arms and his legs.  Fine motor movements are a bit clumsy, particularly  with rapid repetitive activity.  There is no pronator drift.  Sensation  shows a mild peripheral neuropathy.  Good stereognosis.  Cerebellar  examination:  No tremor or dystaxia.  Gait was not tested.  Deep tendon  reflexes were absent.  He had bilateral flexor and plantar responses.   PLAN:  The patient will have one change to his medication.  Topamax will be  changed to 150 mg in the morning, 125 at midday and 150 at nighttime.  No  change in  Tegretol, Keppra, Mysoline,  Synthroid or potassium chloride.  He  will return to see me at [facility] at his next regularly  scheduled appointment.  He is discharged in improved condition.  He will be  brought home this afternoon when his parents are done with work.                                                 Deanna Artis. Sharene Skeans, M.D.    [employer]  D:  12/23/2002  T:  12/24/2002  Job:  [id number]   cc:   Fonnie Mu, M.D.  [address] [place]  [place] [address]  Fax: [phone]

## 2011-02-07 NOTE — Discharge Summary (Signed)
NAME:  [name], [name]               ACCOUNT NO.:  [id number]   MEDICAL RECORD NO.:  [id number]          PATIENT TYPE:  INP   LOCATION:  6148                         FACILITY:  [facility]   PHYSICIAN:  Orie Rout, M.D.DATE OF BIRTH:  04-10-1986   DATE OF ADMISSION:  01/13/2005  DATE OF DISCHARGE:  01/15/2005                                 DISCHARGE SUMMARY   HISTORY OF PRESENT ILLNESS:  [name] is a 25 year old male with a history of  a chronic seizure disorder, growth delay, hypothyroidism, hypopituitarism  and mitral valve prolapse with mild mitral regurgitation, who has a poorly  controlled seizure disorder and frequent admissions to the hospital for  seizure exacerbations in the past.  He presented with greater than 25  seizures at home on the day of admission.  He was given rectal Valium at  home, which did not break his seizures.  He was then brought to the [facility] ER where he received IV Ativan and was loaded with phenobarbital.  He  was afebrile during his presentation and had a normal white blood cell count  of 6.   HOSPITAL COURSE:  He was admitted to the pediatric service and seen by Dr.  Sharene Skeans from pediatric neurology.  His seizure medications were not changed  as there were no further seizures after his initial episode in the ER.  He  was stable throughout his admission, however, he was noted to have leaking  from his port.  His port was assessed by surgery and they were not worried  for any pathology.  A blood culture on admission was obtained and this grew  gram-positive cocci in clusters.  With this positive blood culture, [name]  was given 24 hours of IV vancomycin.  The final ID on the organism showed  coagulase-negative Staphylococcus aureus, which was sensitive to  clindamycin.  He received one day of IV clindamycin and showed no signs of  sepsis or hemodynamic instability.   FINAL DIAGNOSIS:  1.  Coagulase-negative Staphylococcus aureus bacteremia.  2.   Seizure disorder.  3.  Mild mental retardation.  4.  Ventriculoseptal defect.  5.  Mitral valve prolapse with mild mitral regurgitation.  6.  Scoliosis.  7.  Hypothyroidism.  8.  Hypopituitarism.   PROCEDURES:  There were no procedures or operations done on this hospital  admission.   CONDITION ON DISCHARGE:  Stable.   HOME MEDICATIONS:  Clindamycin 600 mg IV q.8h. for approximately nine days.  The stop date on this will be Jan 24, 2005.  A social work consult was done  to have home health IV therapy to assist [name] and his mother with this  therapy.   FOLLOWUP:  Followup will be with [facility] in the coming week.      /MEDQ  D:  01/15/2005  T:  01/15/2005  Job:  [id number]

## 2011-02-07 NOTE — H&P (Signed)
[facility]  Patient:    [name], BORN                      MRN: [id number] Adm. 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  Etiology of his seizures is unclear.  He has had extensive workup in [place] at [facility] and at [facility] looking for an etiology for his seizures.  Workup has recently centered around inflammatory nervous system disease versus a mytochondrial encephalopathy. Workup has been largely unrevealing other than a single lumbar puncture that showed slight increase in white cells after the patient had had prolonged seizure.  This may have been a nonspecific finding.  The patients pattern has been anywhere from five to seven seizures per month. On his last hospitalization for infusion, August 29, 2000, I noted that the patient had had seizure frequency as follows; May 5, June 5, July 9, August 7, September 7, October 6, November 9.  To that we can add December 6, and January 3 so far.  In May he had an emergency room visit, June he had an admission to the hospital, July two emergency room visits, August one emergency room visit, September two emergency room visits, October one emergency room visit, admission in November, emergency room visit in December which I will describe below, and another one on January 1.  The patient has had now infusions of IVIG August 25 and 26, September 22 and 23, October 28 and 29, December 8 and 9, and his fifth today and tomorrow. The patients two hospital visits in December, on the 22nd he  had five somatosensory seizures treated with Diazepam initially and then a 25-minute generalized tonic clonic seizure treated with Diastat around 11:15.  The patient arrived at [facility] at 11:50.  Despite an order given by the parents to the physicians to give 1 mg of Ativan, this was not carried out until after he had had 7 to 8 more somatosensory seizures around 1:30.  He had three more seizures and received another 1 mg of Ativan.  My partner briefly saw this patient and I am not certain why my orders were not carried out as requested.  The patient also had a brief emergency room visit on January 1, after a minute and a half generalized tonic clonic seizure.  He was given 1 mg of Ativan. He had some raspy breathing and had another single brief seizure at 3:15 p.m. Neither visit requiring hospitalization.  REVIEW OF SYSTEMS:  No acute infections or injuries.  The patient has a VSD, short stature, somewhat dysmorphic features, and mental retardation, but has basically been a healthy child.  MEDICATIONS: 1. Compamax 125 mg three times a day. 2. Keppra 1000 mg twice a day. 3. Tegretol 150 mg in the morning, 200 mg at midday, and 250 mg at nighttime. 4. Synthroid 0.075 mg per day.  ALLERGIES:  No known drug allergies.  INTOLERANCES:  Dilantin has caused increased seizures, Depakote severe nausea  and vomiting.  SOCIAL HISTORY:  The patient lives with his parents and sister.  He has mental retardation.  He is in middle school in a cross categorical class.  PHYSICAL EXAMINATION:  VITAL SIGNS:  Blood pressure 86/56, resting pulse 74, respirations 32, height 56 inches, weight 26.4 kg.  HEENT:  No signs of infection and no bruits.  LUNGS:  Clear to auscultation.  HEART:  Showed a holosystolic murmur over the left sternal border radiating to the precordium.  Pulses were normal.  ABDOMEN:  Soft and nontender.  Bowel sounds normal with no  hepatosplenomegaly.  EXTREMITIES:  Showed no edema or cyanosis.  NEUROLOGICAL:  The patient seems a little listless and not as sharp as usual. Nonetheless he is able to name objects, follow commands.  Cranial nerves; round, reactive pupils, visual fields full to double simultaneous stimuli, OKN responses equal, symmetric facial strength, midline tongue and uvula, air conduction greater than bone conduction bilaterally.  Motor examination shows nearly normal strength.  He has decreased mass and may have mild weakness, but for his size, this is normal and stable.  No pronator drift.  Fine motor movements are normal.  Sensation intact to primary and cortical modalities.  Gait was slightly ataxic.  Deep tendon reflexes were absent.  He had bilateral flexor plantar responses.  IMPRESSION: 1. 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  345.41    345.11 2. Mental retardation.  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  PLAN:  IVIG three doses 15 grams every 12 hours and then home.  Next treatment would be the weekend of February 9.  I will discuss this with Rory Percy, M.D.  If we show no positive change, we will schedule a prolonged video EEG at [facility] and consider placement of vagal nerve stimulator if the patient is not a surgical candidate. DD:  10/03/00 TD:  10/03/00 Job: [id number]

## 2011-02-07 NOTE — Discharge Summary (Addendum)
[name], [name] 111                     ACCOUNT NO.:  [id number]   MEDICAL RECORD NO.:  [id number]                   PATIENT TYPE:  INP   LOCATION:  6118                                 FACILITY:  [facility]   PHYSICIAN:  Pediatrics Resident                 DATE OF BIRTH:  11/23/1985   DATE OF ADMISSION:  07/23/2003  DATE OF DISCHARGE:  07/28/2003                                 DISCHARGE SUMMARY   PRIMARY MEDICAL DOCTOR:  Dr. Murrell Redden. [place]. Phone number [phone].  Fax [phone].   NEUROLOGIST:  Dr. Deanna Artis. [place].  Phone [phone].  Fax [phone]  [phone].   ____ QA MARKER: 80 ____         SERVICE:  Pediatric Teaching Service, [facility].   ATTENDING PHYSICIAN:  Dr. Asher Muir.   DISCHARGE DIAGNOSES:  1. Viral respiratory infection, possible atypical pneumonia.  2. Intractable seizure disorder.  3. Hypothyroidism.  4. Developmental delay.   LABORATORY DATA AND STUDIES:  October 31, urinalysis was within normal  limits.  October 31, creatinine 0.6.  October 31, Tegretol level 10.3.  October 31, phenobarbital level 13.0.  October 31, influenza A antigen  negative, and influenza B antigen negative.  October 31, Bordetella  pertussis PCR negative.  October 31, blood culture from [name] grew  coagulase-negative Staphylococcus.  November 1, basic metabolic panel,  sodium 137, potassium 3.4, chloride 109, bicarbonate 22, BUN 5, creatinine  0.7, glucose 152, calcium 8.4.  November 1, stool for gram stain, no white  blood cells, moderate gram-negative rods, moderate gram positive cocci in  pairs.  A few gram-positive rods.  November 1 stool culture negative for  salmonella, shigella, Campylobacter and yersinia.  On November 1, rotavirus  antigen negative.  November 1, Clostridium difficile toxin negative.  November 2, blood culture no growth x3 days, pending for five days.  November 2, vancomycin trough 12.0.  November 3, vancomycin peak 34.1.   On  October 31, chest x-ray, PA and lateral views slight hyperinflation, no  focal infiltrates, no pneumothorax.  Stable overall appearance.   HOSPITAL COURSE:  The patient is a 25 year old Caucasian male with a prior  history of intractable seizure disorder, hypothyroidism, and development  delay, who presented on October 31 with a one-week history of productive  cough, one week history of low-grade fever, a three-day history of diarrhea  and a two-day history of vomiting.   1. ID.  The patient was put on a five-day course of Zithromax to cover for     possible atypical pneumonia in light of history of respiratory symptoms     and chest x-ray findings.  The patient improved over the course of his     stay while receiving five days of Zithromax.  The patient had one     positive blood culture that grew coagulase negative Staphylococcus.     Until that final blood culture was initiated, he was  placed on vancomycin     to cover for possible MRSA in light of his [name].  A second blood     culture was drawn prior to initially of vancomycin.  The first blood     culture was speciated to show coagulase negative Staphylococcus which was     believed to be a contaminant.  The second blood culture, after being     negative for 48 hours, it was decided to stop the vancomycin.  The second     blood culture has remained negative to date.  2. Neurologic.  On the morning of November 2, at 5 a.m., the patient     experienced a 17-minute seizure.  Prior to the seizure, the patient had     two to three short seizures lasting approximately one to three minutes on     November 1, and two short seizures lasting approximately one to three     minutes immediately before the prolonged seizure that occurred at 5 a.m.     on November 2.  The patient received a total of 4 mg of Ativan given as 2     mg Ativan IV x2, and 5 mg/kg of phenobarbital x1 in order to stop the     seizure activity.  His vital signs  remained stable throughout and oxygen     via nasal cannula was applied along with intermittent oral suctioning for     support.  Postictally, the patient recovered well.  The patient had no     further seizure activity since the morning of November 2.  Antiepileptic     medications were continued at their pre-hospitalization doses after     discussing the event with Dr. Sharene Skeans.  3. Respiratory. The patient received a total of five days of Zithromax for     possible atypical pneumonia based on chest x-ray findings and respiratory     symptoms.  The patient also received p.r.n. Robitussin DM, and p.r.n.     albuterol 2.5 mg q.6h. p.r.n. wheezing.  Over the course of his     hospitalization, the patient's respiratory symptoms improved.  4. GI.  Prior to admission, the patient had a three day history of Diarrhea     and a two-day history of vomiting.  After admission, the patient had only     one loose stool which occurred on November 3, and one to two cases of     posttussive emesis which occurred on the morning of November 4.  The     patient had a poor appetite in the beginning of his hospital stay, and     improved p.o. intake and p.o. tolerance by the end of his hospital stay.     All stool studies that were performed were negative.  5. FEN.  The patient required maintenance IV fluids upon admission until     November 4, secondary to poor p.o. intake and dehydration prior to     admission.  After November 4, the patient began having adequate p.o.     intake of solids and liquids, and IV fluids were able to be discontinued.  6. Disposition.  The patient will be discharged home in a condition much     improved from admission.   DISCHARGE PLANNING:  1. The patient will have a followup appointment with Dr. Clarene Duke at [facility] on Monday, November 8, at 11 a.m.  2. Discharge medications include primidone 125 mg p.o. q.a.m.,  250 mg p.o.     q.p.m.; Tegretol 250 mg q.a.m. and     q. noon, and 300 mg q.p.m.; Topamax 150 mg p.o. t.i.d., Keppra 1,250 mg     p.o. b.i.d. , Synthroid 0.75 micrograms p.o. daily, potassium chloride 20     mEq p.o. q.i.d. , Ativan 2 mg IV p.r.n. seizure, may repeat x1;     phenobarbital 65 mg IV p.r.n. seizure.                                                Pediatrics Resident    PR/MEDQ  D:  07/28/2003  T:  07/29/2003  Job:  [id number]   cc:   Thereasa Solo. Little, M.D.  [address]  [place] [address]  Fax: [phone]   Deanna Artis. Sharene Skeans, M.D.  [address]  [address]  [place]  [place] [address]  Fax: [phone]

## 2011-02-11 ENCOUNTER — Encounter: Payer: Self-pay | Admitting: Internal Medicine

## 2011-02-11 ENCOUNTER — Ambulatory Visit (INDEPENDENT_AMBULATORY_CARE_PROVIDER_SITE_OTHER): Payer: Medicaid Other | Admitting: Internal Medicine

## 2011-02-11 DIAGNOSIS — E039 Hypothyroidism, unspecified: Secondary | ICD-10-CM

## 2011-02-11 DIAGNOSIS — J309 Allergic rhinitis, unspecified: Secondary | ICD-10-CM

## 2011-02-11 DIAGNOSIS — R569 Unspecified convulsions: Secondary | ICD-10-CM

## 2011-02-11 DIAGNOSIS — I059 Rheumatic mitral valve disease, unspecified: Secondary | ICD-10-CM

## 2011-02-11 MED ORDER — FLUTICASONE PROPIONATE 50 MCG/ACT NA SUSP: 1.0000 | Freq: Every day | NASAL | Status: DC

## 2011-02-11 NOTE — Patient Instructions (Signed)
Get plenty of rest, Drink lots of  clear liquids, and use Tylenol or ibuprofen for fever and discomfort.    Call or return to clinic prn if these symptoms worsen or fail to improve as anticipated.  

## 2011-02-11 NOTE — Progress Notes (Signed)
  Subjective:    Patient ID: [name], male    DOB: 20-Dec-1985, 25 y.o.   MRN: [id number]  HPI 25 year old patient who is followed closely by neurology for seizure disorder. He has hypothyroidism and presents with a 4 to five-day history of largely nonproductive cough he has had some chest congestion. Denies any wheezing shortness of breath fever chills or chest pain. He is to come in by his mother who has similar symptoms.      Review of Systems  Constitutional: Negative for fever, chills, appetite change and fatigue.  HENT: Positive for congestion and rhinorrhea. Negative for hearing loss, ear pain, sore throat, trouble swallowing, neck stiffness, dental problem, voice change and tinnitus.   Eyes: Negative for pain, discharge and visual disturbance.  Respiratory: Positive for cough. Negative for chest tightness, wheezing and stridor.   Cardiovascular: Negative for chest pain, palpitations and leg swelling.  Gastrointestinal: Negative for nausea, vomiting, abdominal pain, diarrhea, constipation, blood in stool and abdominal distention.  Genitourinary: Negative for urgency, hematuria, flank pain, discharge, difficulty urinating and genital sores.  Musculoskeletal: Negative for myalgias, back pain, joint swelling, arthralgias and gait problem.  Skin: Negative for rash.  Neurological: Negative for dizziness, syncope, speech difficulty, weakness, numbness and headaches.  Hematological: Negative for adenopathy. Does not bruise/bleed easily.  Psychiatric/Behavioral: Negative for behavioral problems and dysphoric mood. The patient is not nervous/anxious.        Objective:   Physical Exam  Constitutional: He is oriented to person, place, and time. He appears well-developed.  HENT:  Head: Normocephalic.  Right Ear: External ear normal.  Left Ear: External ear normal.  Eyes: Conjunctivae and EOM are normal.  Neck: Normal range of motion.  Cardiovascular: Normal rate.   Murmur heard.   Grade 3/6 mid to late apical murmur  Pulmonary/Chest: Breath sounds normal.  Abdominal: Bowel sounds are normal.  Musculoskeletal: Normal range of motion. He exhibits no edema and no tenderness.  Neurological: He is alert and oriented to person, place, and time.  Psychiatric: He has a normal mood and affect. His behavior is normal.          Assessment & Plan:  Viral URI. Will treat symptomatically Seizure disorder. Per neurology Hypothyroidism Mitral valve prolapse

## 2011-05-01 ENCOUNTER — Ambulatory Visit
Admission: RE | Admit: 2011-05-01 | Discharge: 2011-05-01 | Disposition: A | Discharge status: Home / Self Care | Payer: Medicaid Other | Source: Ambulatory Visit | Attending: Pediatrics | Admitting: Pediatrics

## 2011-05-01 ENCOUNTER — Other Ambulatory Visit: Payer: Self-pay | Admitting: Pediatrics

## 2011-05-01 DIAGNOSIS — G40319 Generalized idiopathic epilepsy and epileptic syndromes, intractable, without status epilepticus: Secondary | ICD-10-CM

## 2011-05-05 ENCOUNTER — Ambulatory Visit: Payer: Medicaid Other | Admitting: Internal Medicine

## 2011-05-06 ENCOUNTER — Ambulatory Visit (INDEPENDENT_AMBULATORY_CARE_PROVIDER_SITE_OTHER): Payer: Medicaid Other | Admitting: Internal Medicine

## 2011-05-06 ENCOUNTER — Encounter: Payer: Self-pay | Admitting: Internal Medicine

## 2011-05-06 DIAGNOSIS — B070 Plantar wart: Secondary | ICD-10-CM

## 2011-05-06 DIAGNOSIS — R569 Unspecified convulsions: Secondary | ICD-10-CM

## 2011-05-06 NOTE — Patient Instructions (Signed)
Dermatology follow-up as scheduled  Call or return to clinic prn if these symptoms worsen or fail to improve as anticipated.     

## 2011-05-06 NOTE — Progress Notes (Signed)
  Subjective:    Patient ID: [name], male    DOB: [birth year], 25 y.o.   MRN: [id number]  HPI  25 year old patient who has a history of a chronic seizure disorder. He's had a recent seizure resulting in a right shoulder dislocation. The sheath complaint today is multiple plantar warts. This causes some discomfort because they are in weightbearing areas. They have been present for some time his mother is requesting referral to dermatology. She has used creams for a dermatology at   Review of Systems  Skin: Positive for rash.  Neurological: Positive for seizures.       Objective:   Physical Exam  Constitutional:       Alert no distress. Right shoulder in a sling  Skin:       Multiple plantar warts involving both feet          Assessment & Plan:  Multiple plantar warts Chronic seizure disorder   Will refer to dermatology. Followup neurology

## 2011-05-19 ENCOUNTER — Other Ambulatory Visit: Payer: Self-pay

## 2011-05-19 MED ORDER — SYNTHROID 50 MCG PO TABS: 50.0000 ug | ORAL_TABLET | Freq: Every day | ORAL | Status: DC

## 2011-05-19 NOTE — Telephone Encounter (Signed)
Brand only syntroid rx sent to [facility]

## 2011-06-05 ENCOUNTER — Ambulatory Visit (HOSPITAL_COMMUNITY)
Admission: RE | Admit: 2011-06-05 | Discharge: 2011-06-05 | Disposition: A | Discharge status: Home / Self Care | Payer: Medicaid Other | Source: Ambulatory Visit | Attending: Pediatrics | Admitting: Pediatrics

## 2011-06-05 DIAGNOSIS — G40309 Generalized idiopathic epilepsy and epileptic syndromes, not intractable, without status epilepticus: Secondary | ICD-10-CM | POA: Insufficient documentation

## 2011-06-05 NOTE — Procedures (Signed)
EEG NUMBER:  12 [phone].  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  Seizures have been increasing in frequency despite for medications and a vagal nerve stimulator.  The study is being done to look for the change in background. (345.51, 345.11)  PROCEDURE:  The tracing is carried out on a 32-channel digital [employer]well recorder reformatted into 16-channel montages with one devoted to EKG. The patient was awake and drowsy during the recording.  The [employer] 10/20 system lead placement was used.  DESCRIPTION OF FINDINGS:  The dominant frequency is an 8 Hz, 30 microvolt activity that is seen in the posterior regions.  Background activity during times the patient is thoroughly awake includes alpha range activity and low voltage under 10 microvolt beta range components.  The patient becomes drowsy with mixed frequency theta and upper delta range activity.  Photic stimulation induced a driving response between 3 and 18 Hz. Hyperventilation was not carried out.  There was a single period of about 20 seconds from rhythmic, 5 Hz frontally predominant and 50 microvolt theta range activity was seen. There were no clinical accompaniments associated with this.  It was fragmentary and did not change frequency in order to become sharply contoured.  This activity preceded onset of drowsiness.  There was no focal slowing. There was no interictal epileptiform activity in the form of spikes or sharp waves.  Photic stimulation induced a driving response from 1-61 Hz.  Hyperventilation was not carried out.  EKG showed regular sinus rhythm with ventricular response of 84 beats per minute.  IMPRESSION:  This is a essentially normal record with the patient awake and drowsy.  The 5 Hz frontal activity was not definitely epileptogenic from electrographic viewpoint.  Sharply contoured slow wave  activity was seen.  In comparison with the previous study April 21, 2008, there has been no significant change in background activity.  There is no evidence of triphasic waves or sharply contoured slow waves as was seen in that study.     Deanna Artis. Sharene Skeans, M.D. Electronically Signed   WRU:EAVW D:  06/05/2011 22:10:25  T:  06/05/2011 22:28:14  Job #:  [id number]

## 2011-06-12 LAB — CLOSTRIDIUM DIFFICILE EIA: C difficile Toxins A+B, EIA: NEGATIVE

## 2011-06-12 LAB — CBC
HCT: 20.8 — ABNORMAL LOW
MCHC: 35.4
MCV: 80.8
Platelets: 200
Platelets: 316
Platelets: 340
RDW: 12.7
RDW: 13.1
RDW: 13.2
WBC: 5.6

## 2011-06-12 LAB — BASIC METABOLIC PANEL
BUN: 1 — ABNORMAL LOW
BUN: <1 — ABNORMAL LOW
CO2: 16 — ABNORMAL LOW
Calcium: 8.3 — ABNORMAL LOW
Chloride: 112
Chloride: 82 — ABNORMAL LOW
Creatinine, Ser: 0.65
Creatinine, Ser: 0.69
GFR calc Af Amer: >60
GFR calc non Af Amer: 55 — ABNORMAL LOW
GFR calc non Af Amer: >60
GFR calc non Af Amer: >60
Glucose, Bld: 236 — ABNORMAL HIGH
Glucose, Bld: 93
Potassium: 2.5 — CL

## 2011-06-12 LAB — CULTURE, BLOOD (ROUTINE X 2)

## 2011-06-12 LAB — POCT I-STAT 3, ART BLOOD GAS (G3+)
Acid-base deficit: 6 — ABNORMAL HIGH
O2 Saturation: 98
Operator id: 276051
Patient temperature: 37

## 2011-06-12 LAB — INFLUENZA A+B VIRUS AG-DIRECT(RAPID)
Inflenza A Ag: NEGATIVE
Influenza B Ag: NEGATIVE

## 2011-06-12 LAB — PRIMIDONE AND METABOLITE LEVEL: Phenobarbital: 7.8 — ABNORMAL LOW

## 2011-06-12 LAB — VANCOMYCIN, TROUGH: Vancomycin Tr: 10.3

## 2011-06-12 LAB — PROTIME-INR: INR: 1.2

## 2011-06-20 LAB — BASIC METABOLIC PANEL
BUN: 7
Chloride: 112
Glucose, Bld: 102 — ABNORMAL HIGH
Potassium: 3.5

## 2011-06-20 LAB — COMPREHENSIVE METABOLIC PANEL
ALT: 26
AST: 27
CO2: 17 — ABNORMAL LOW
Calcium: 8.4
GFR calc Af Amer: >60
GFR calc non Af Amer: >60
Sodium: 133 — ABNORMAL LOW
Total Protein: 6.4

## 2011-06-20 LAB — VANCOMYCIN, TROUGH: Vancomycin Tr: 5.3

## 2011-06-20 LAB — URINE CULTURE: Colony Count: 3000

## 2011-06-20 LAB — DIFFERENTIAL
Eosinophils Absolute: 0
Eosinophils Relative: 0
Lymphs Abs: 0.6 — ABNORMAL LOW
Monocytes Relative: 3

## 2011-06-20 LAB — URINALYSIS, ROUTINE W REFLEX MICROSCOPIC
Bilirubin Urine: NEGATIVE
Hgb urine dipstick: NEGATIVE
Specific Gravity, Urine: 1.014
pH: 6.5

## 2011-06-20 LAB — CBC
MCHC: 34.3
RBC: 4.38
WBC: 7.1

## 2011-06-20 LAB — CULTURE, BLOOD (ROUTINE X 2): Culture: NO GROWTH

## 2011-06-20 LAB — CULTURE, BLOOD (SINGLE)

## 2011-06-27 LAB — CBC
HCT: 31.4 — ABNORMAL LOW
Hemoglobin: 10.9 — ABNORMAL LOW
MCHC: 34.6
MCV: 82.3
Platelets: 211
RBC: 3.81 — ABNORMAL LOW
RDW: 13.2
WBC: 5.9

## 2011-06-27 LAB — COMPREHENSIVE METABOLIC PANEL
AST: 41 — ABNORMAL HIGH
Albumin: 2.7 — ABNORMAL LOW
Chloride: 107
Creatinine, Ser: 0.72
GFR calc Af Amer: >60
Total Bilirubin: 1.2
Total Protein: 6

## 2011-06-27 LAB — BASIC METABOLIC PANEL
GFR calc non Af Amer: >60
Potassium: 3.2 — ABNORMAL LOW
Sodium: 134 — ABNORMAL LOW

## 2011-06-27 LAB — PHENOBARBITAL LEVEL: Phenobarbital: 11.8 — ABNORMAL LOW

## 2011-06-27 LAB — CULTURE, BLOOD (ROUTINE X 2)

## 2011-06-27 LAB — TSH: TSH: 0.013 — ABNORMAL LOW

## 2011-06-27 LAB — AMYLASE: Amylase: 63

## 2011-06-27 LAB — CARBAMAZEPINE LEVEL, TOTAL: Carbamazepine Lvl: 13.1 — ABNORMAL HIGH

## 2011-07-09 LAB — CBC
HCT: 37.2 — ABNORMAL LOW
Hemoglobin: 12.3 — ABNORMAL LOW
Hemoglobin: 12.7 — ABNORMAL LOW
MCHC: 34.1
MCV: 83.2
RBC: 4.42
RBC: 4.47
RDW: 13.9

## 2011-07-09 LAB — DIFFERENTIAL
Basophils Absolute: 0
Lymphocytes Relative: 59 — ABNORMAL HIGH
Monocytes Relative: 6
Neutro Abs: 0.9 — ABNORMAL LOW

## 2011-07-09 LAB — COMPREHENSIVE METABOLIC PANEL
ALT: 22
AST: 20
Albumin: 3.4 — ABNORMAL LOW
Alkaline Phosphatase: 92
BUN: 8
CO2: 19
Calcium: 8.4
Chloride: 111
Creatinine, Ser: 0.8
GFR calc Af Amer: >60
GFR calc non Af Amer: >60
Glucose, Bld: 103 — ABNORMAL HIGH
Glucose, Bld: 115 — ABNORMAL HIGH
Potassium: 3.5
Sodium: 136
Total Bilirubin: 0.8
Total Protein: 6.5

## 2011-07-09 LAB — PHENOBARBITAL LEVEL
Phenobarbital: 13.7 — ABNORMAL LOW
Phenobarbital: 14.9 — ABNORMAL LOW

## 2011-07-09 LAB — CARBAMAZEPINE LEVEL, TOTAL: Carbamazepine Lvl: 9.3

## 2011-07-09 LAB — PRIMIDONE AND METABOLITE LEVEL: Primidone, Serum: 8 (ref 5–12)

## 2011-08-18 ENCOUNTER — Encounter: Payer: Self-pay | Admitting: Internal Medicine

## 2011-08-18 ENCOUNTER — Ambulatory Visit (INDEPENDENT_AMBULATORY_CARE_PROVIDER_SITE_OTHER): Payer: Medicaid Other | Admitting: Internal Medicine

## 2011-08-18 DIAGNOSIS — R220 Localized swelling, mass and lump, head: Secondary | ICD-10-CM

## 2011-08-18 DIAGNOSIS — R569 Unspecified convulsions: Secondary | ICD-10-CM

## 2011-08-18 DIAGNOSIS — J309 Allergic rhinitis, unspecified: Secondary | ICD-10-CM

## 2011-08-18 DIAGNOSIS — E039 Hypothyroidism, unspecified: Secondary | ICD-10-CM

## 2011-08-18 LAB — CBC WITH DIFFERENTIAL/PLATELET
Basophils Relative: 0.2 % (ref 0.0–3.0)
Eosinophils Absolute: 0 10*3/uL (ref 0.0–0.7)
Eosinophils Relative: 0.5 % (ref 0.0–5.0)
Hemoglobin: 12.2 g/dL — ABNORMAL LOW (ref 13.0–17.0)
Lymphocytes Relative: 40.7 % (ref 12.0–46.0)
MCHC: 34.5 g/dL (ref 30.0–36.0)
MCV: 83.9 fl (ref 78.0–100.0)
Neutro Abs: 2.2 10*3/uL (ref 1.4–7.7)
Neutrophils Relative %: 50.5 % (ref 43.0–77.0)
RBC: 4.2 Mil/uL — ABNORMAL LOW (ref 4.22–5.81)
WBC: 4.3 10*3/uL — ABNORMAL LOW (ref 4.5–10.5)

## 2011-08-18 LAB — COMPREHENSIVE METABOLIC PANEL
AST: 21 U/L (ref 0–37)
Albumin: 3.5 g/dL (ref 3.5–5.2)
Alkaline Phosphatase: 87 U/L (ref 39–117)
BUN: 11 mg/dL (ref 6–23)
Calcium: 8.9 mg/dL (ref 8.4–10.5)
Creatinine, Ser: 0.9 mg/dL (ref 0.4–1.5)
Glucose, Bld: 102 mg/dL — ABNORMAL HIGH (ref 70–99)

## 2011-08-18 NOTE — Patient Instructions (Signed)
Limit your sodium (Salt) intake  Call or return to clinic prn if these symptoms worsen or fail to improve as anticipated.   

## 2011-08-18 NOTE — Progress Notes (Signed)
  Subjective:    Patient ID: [name], male    DOB: 22-Dec-1985, 25 y.o.   MRN: [id number]  HPI 25 year old patient who is seen today for followup. He is followed closely by neurology for a chronic seizure disorder. For the past 2 days he has noticed some facial swelling. This seems to have improved. Denies any tongue swelling shortness of breath or wheezing. He has treated hypothyroidism but no recent thyroid function studies In general he feels well He is on medication for allergic rhinitis which he takes chronically    Review of Systems  Constitutional: Negative for fever, chills, appetite change and fatigue.  HENT: Negative for hearing loss, ear pain, congestion, sore throat, trouble swallowing, neck stiffness, dental problem, voice change and tinnitus.   Eyes: Negative for pain, discharge and visual disturbance.  Respiratory: Negative for cough, chest tightness, wheezing and stridor.   Cardiovascular: Negative for chest pain, palpitations and leg swelling.  Gastrointestinal: Negative for nausea, vomiting, abdominal pain, diarrhea, constipation, blood in stool and abdominal distention.  Genitourinary: Negative for urgency, hematuria, flank pain, discharge, difficulty urinating and genital sores.  Musculoskeletal: Negative for myalgias, back pain, joint swelling, arthralgias and gait problem.  Skin: Negative for rash.       Swelling of the head and neck  Neurological: Negative for dizziness, syncope, speech difficulty, weakness, numbness and headaches.  Hematological: Negative for adenopathy. Does not bruise/bleed easily.  Psychiatric/Behavioral: Negative for behavioral problems and dysphoric mood. The patient is not nervous/anxious.        Objective:   Physical Exam  Constitutional: He is oriented to person, place, and time. No distress.  HENT:  Head: Normocephalic.  Right Ear: External ear normal.  Left Ear: External ear normal.  Mouth/Throat: Oropharynx is clear and moist.        Appears as a mild facial puffiness and perhaps some swelling about the neck area  Eyes: Conjunctivae and EOM are normal.  Neck: Normal range of motion. No JVD present. No thyromegaly present.       Mild neck swelling  Cardiovascular: Normal rate and normal heart sounds.   Pulmonary/Chest: Effort normal and breath sounds normal. No respiratory distress. He has no wheezes. He has no rales.       Clear  Abdominal: Bowel sounds are normal.  Musculoskeletal: Normal range of motion. He exhibits no edema and no tenderness.  Lymphadenopathy:    He has no cervical adenopathy.  Neurological: He is alert and oriented to person, place, and time.  Psychiatric: He has a normal mood and affect. His behavior is normal.          Assessment & Plan:   Mild swelling in the head and neck.  This is very minor and is improving. We'll clinically observe at this time. We'll call if symptoms recur and we'll further investigate. We'll check screening labs today  Hypothyroidism Chronic seizure disorder

## 2011-08-20 ENCOUNTER — Telehealth: Payer: Self-pay | Admitting: *Deleted

## 2011-08-20 NOTE — Telephone Encounter (Signed)
spke with sister - she has since called neuro - they had instructed them to give benadryl - this could be seasonal allergy sx - giving 12.5mg  q6hr - watching for increase in seizure activity. Stable at this time. Instructed her to call if changes and we need to see. Also discussed labs - stable

## 2011-08-20 NOTE — Telephone Encounter (Signed)
Patient's sister? Is calling because the patient is still having puffiness of the face, coughing, and congestion.  Any suggestions?  Patient was seen in the office recently.

## 2011-08-29 ENCOUNTER — Ambulatory Visit (INDEPENDENT_AMBULATORY_CARE_PROVIDER_SITE_OTHER): Payer: Medicaid Other | Admitting: Internal Medicine

## 2011-08-29 ENCOUNTER — Encounter: Payer: Self-pay | Admitting: Internal Medicine

## 2011-08-29 VITALS — BP 100/68 | Temp 97.4°F | Wt 97.0 lb

## 2011-08-29 DIAGNOSIS — Z0189 Encounter for other specified special examinations: Secondary | ICD-10-CM

## 2011-08-29 NOTE — Progress Notes (Signed)
  Subjective:    Patient ID: [name], male    DOB: 07/16/1986, 25 y.o.   MRN: [id number]  HPI  25 year old patient who has a history of a seizure disorder. Due to his epilepsy he often sleeps near his parents who have witnessed very loud snoring and disruptive sleep habits. He also has daytime sleepiness. Both parents have obstructive sleep apnea and are concerned about this potential diagnosis   Review of Systems  Psychiatric/Behavioral: Positive for sleep disturbance.       Objective:   Physical Exam  Constitutional: He appears well-developed and well-nourished. No distress.  HENT:       Extremity low hanging soft palate  Psychiatric: He has a normal mood and affect. His behavior is normal.          Assessment & Plan:   Rule out obstructive sleep apnea. The patient is set up for a sleep study

## 2011-08-29 NOTE — Patient Instructions (Signed)
Nocturnal sleep study as discussed  Return as scheduled for followup

## 2011-09-01 ENCOUNTER — Emergency Department (HOSPITAL_COMMUNITY)
Admission: EM | Admit: 2011-09-01 | Discharge: 2011-09-01 | Disposition: A | Discharge status: Home / Self Care | Payer: Medicaid Other | Attending: Emergency Medicine | Admitting: Emergency Medicine

## 2011-09-01 ENCOUNTER — Ambulatory Visit (HOSPITAL_COMMUNITY)
Admission: RE | Admit: 2011-09-01 | Discharge: 2011-09-01 | Disposition: A | Discharge status: Home / Self Care | Payer: Medicaid Other | Source: Ambulatory Visit | Attending: Pediatrics | Admitting: Pediatrics

## 2011-09-01 ENCOUNTER — Emergency Department (HOSPITAL_COMMUNITY): Payer: Medicaid Other

## 2011-09-01 ENCOUNTER — Other Ambulatory Visit (HOSPITAL_COMMUNITY): Payer: Self-pay | Admitting: *Deleted

## 2011-09-01 ENCOUNTER — Encounter (HOSPITAL_COMMUNITY): Payer: Self-pay | Admitting: *Deleted

## 2011-09-01 ENCOUNTER — Other Ambulatory Visit (HOSPITAL_COMMUNITY): Payer: Self-pay | Admitting: Pediatrics

## 2011-09-01 DIAGNOSIS — R220 Localized swelling, mass and lump, head: Secondary | ICD-10-CM | POA: Insufficient documentation

## 2011-09-01 DIAGNOSIS — Y9210 Unspecified residential institution as the place of occurrence of the external cause: Secondary | ICD-10-CM | POA: Insufficient documentation

## 2011-09-01 DIAGNOSIS — S0003XA Contusion of scalp, initial encounter: Secondary | ICD-10-CM | POA: Insufficient documentation

## 2011-09-01 DIAGNOSIS — R609 Edema, unspecified: Secondary | ICD-10-CM

## 2011-09-01 DIAGNOSIS — Z79899 Other long term (current) drug therapy: Secondary | ICD-10-CM | POA: Insufficient documentation

## 2011-09-01 DIAGNOSIS — R569 Unspecified convulsions: Secondary | ICD-10-CM

## 2011-09-01 DIAGNOSIS — E039 Hypothyroidism, unspecified: Secondary | ICD-10-CM | POA: Insufficient documentation

## 2011-09-01 DIAGNOSIS — G40909 Epilepsy, unspecified, not intractable, without status epilepticus: Secondary | ICD-10-CM | POA: Insufficient documentation

## 2011-09-01 DIAGNOSIS — R296 Repeated falls: Secondary | ICD-10-CM | POA: Insufficient documentation

## 2011-09-01 DIAGNOSIS — R222 Localized swelling, mass and lump, trunk: Secondary | ICD-10-CM | POA: Insufficient documentation

## 2011-09-01 DIAGNOSIS — R040 Epistaxis: Secondary | ICD-10-CM | POA: Insufficient documentation

## 2011-09-01 DIAGNOSIS — F70 Mild intellectual disabilities: Secondary | ICD-10-CM | POA: Insufficient documentation

## 2011-09-01 DIAGNOSIS — R51 Headache: Secondary | ICD-10-CM | POA: Insufficient documentation

## 2011-09-01 DIAGNOSIS — G8389 Other specified paralytic syndromes: Secondary | ICD-10-CM | POA: Insufficient documentation

## 2011-09-01 DIAGNOSIS — S0083XA Contusion of other part of head, initial encounter: Secondary | ICD-10-CM

## 2011-09-01 LAB — BASIC METABOLIC PANEL
BUN: 9 mg/dL (ref 6–23)
CO2: 19 mEq/L (ref 19–32)
Chloride: 104 mEq/L (ref 96–112)
Creatinine, Ser: 0.62 mg/dL (ref 0.50–1.35)
Glucose, Bld: 100 mg/dL — ABNORMAL HIGH (ref 70–99)
Potassium: 3.3 mEq/L — ABNORMAL LOW (ref 3.5–5.1)

## 2011-09-01 MED ORDER — IOHEXOL 300 MG/ML  SOLN: 80.0000 mL | Freq: Once | INTRAMUSCULAR | Status: DC | PRN

## 2011-09-01 MED ORDER — SODIUM CHLORIDE 0.9 % IV SOLN: INTRAVENOUS | Status: DC

## 2011-09-01 NOTE — ED Provider Notes (Signed)
History     CSN: [id number] Arrival date & time: 09/01/2011  7:17 PM   First MD Initiated Contact with Patient 09/01/11 1924      Chief Complaint  Patient presents with  . Seizures  . Facial Injury    (Consider location/radiation/quality/duration/timing/severity/associated sxs/prior treatment) Patient is a 25 y.o. male presenting with seizures and facial injury. The history is provided by the patient and a relative.  Seizures  Pertinent negatives include no confusion and no chest pain.  Facial Injury  Associated symptoms include seizures. Pertinent negatives include no chest pain and no abdominal pain.  pt w hx seizure disorder presents after seizure. Pt was in hospital after getting outpt ct scan to evaluate facial puffiness of 2-3 weeks duration, was walking, had onset generalized seizure. [place] Seat to ground hitting head/face. Blood from nose. gtc sz lasted 1-2 minutes. Resolved pta to ed. On arrival to ed awake and alert, answering questions appropriately.  Family states compliant w sz meds, no recent change in dose. Despite sz meds, family notes pt does have periodic seizures not infrequently. No recent increase in seizure freq. Has been at baseline. No recent febrile illness.  Parents indicate pts doctors have been trying to r/o clot assoc w portocath - had ct today which was discussed w radiology and pts neurologist - they were sending home w plan to return to hospital tomorrow for vascular doppler.  Contusion to face. Constant dull pain to mid face and nose. No nv. No severe headache. No numbness/weakness.   Past Medical History  Diagnosis Date  . ALLERGIC RHINITIS 12/26/2009  . MITRAL VALVE PROLAPSE 06/22/2007  . SEIZURE DISORDER 06/22/2007  . Unspecified hypothyroidism 06/22/2007  . Growth disorder   . Mental retardation, mild (I.Q. 50-70)   . Brown's syndrome     Past Surgical History  Procedure Date  . Implantation vagal nerve stimulator     No family history on  file.  History  Substance Use Topics  . Smoking status: Never Smoker   . Smokeless tobacco: Not on file  . Alcohol Use: Not on file      Review of Systems  Constitutional: Negative for fever.  HENT: Positive for nosebleeds.   Eyes: Negative for pain and redness.  Respiratory: Negative for shortness of breath.   Cardiovascular: Negative for chest pain.  Gastrointestinal: Negative for abdominal pain.  Genitourinary: Negative for flank pain.  Musculoskeletal: Negative for back pain.  Skin: Negative for rash.  Neurological: Positive for seizures.  Hematological: Does not bruise/bleed easily.  Psychiatric/Behavioral: Negative for confusion.    Allergies  Divalproex sodium and Phenytoin  Home Medications   Current Outpatient Rx  Name Route Sig Dispense Refill  . CARBAMAZEPINE PO Oral Take 250 mg by mouth 3 (three) times daily.      . DESLORATADINE 5 MG PO TABS Oral Take 1 tablet (5 mg total) by mouth daily. 90 tablet 1  . DIPHENHYDRAMINE HCL 25 MG PO TABS Oral Take 25 mg by mouth every 6 (six) hours as needed.      Marland Kitchen FLUTICASONE PROPIONATE 50 MCG/ACT NA SUSP Nasal 1 spray by Nasal route daily. 16 g 6  . HYDROCODONE-ACETAMINOPHEN 5-500 MG PO TABS Oral Take 1 tablet by mouth every 6 (six) hours as needed.     Marland Kitchen LACOSAMIDE 10 MG/ML PO SOLN Oral Take 3 mLs by mouth 3 (three) times daily. 2.5 cc three times a day    . MONTELUKAST SODIUM 10 MG PO TABS Oral Take 10 mg by  mouth daily.      Marland Kitchen POTASSIUM CHLORIDE CRYS CR 20 MEQ PO TBCR Oral Take 20 mEq by mouth 4 (four) times daily.      Marland Kitchen POTASSIUM PHOSPHATE MONOBASIC 500 MG PO TABS Oral Take 500 mg by mouth 4 (four) times daily.      Marland Kitchen PROMETHAZINE HCL 25 MG PO TABS Oral Take 25 mg by mouth every 6 (six) hours as needed.      Marland Kitchen RANITIDINE HCL 150 MG PO CAPS Oral Take 150 mg by mouth daily.      Marland Kitchen SYNTHROID 50 MCG PO TABS Oral Take 1 tablet (50 mcg total) by mouth daily. 90 tablet 3    Dispense as written.  . TOPIRAMATE 100 MG PO TABS  Oral Take 150 mg by mouth 3 (three) times daily.        There were no vitals taken for this visit.  Physical Exam  Nursing note and vitals reviewed. Constitutional: He appears well-developed and well-nourished. No distress.  HENT:       Contusion to face/nose. sts and tenderness to area. Fresh blood right nares. No active bleeding. No septal hematoma. Teeth intact. No malocclusion.   Eyes: Pupils are equal, round, and reactive to light.  Neck: No tracheal deviation present.       Ccollar. Mid cspine tenderness. Aligned, no step off.   Cardiovascular: Normal rate, normal heart sounds and intact distal pulses.   Pulmonary/Chest: Effort normal and breath sounds normal. No accessory muscle usage. No respiratory distress. He exhibits no tenderness.  Abdominal: Soft. There is no tenderness.  Musculoskeletal: Normal range of motion. He exhibits no edema and no tenderness.  Neurological: He is alert.       Awake and alert. Responding to questions appropriately. Motor intact bil.   Skin: Skin is warm and dry.  Psychiatric: He has a normal mood and affect.    ED Course  Procedures (including critical care time)   Labs Reviewed  BASIC METABOLIC PANEL  CARBAMAZEPINE LEVEL, TOTAL   Ct Chest W Contrast  09/01/2011  *RADIOLOGY REPORT*  Clinical Data: Neck and chest swelling.  CT CHEST WITH CONTRAST  Technique:  Multidetector CT imaging of the chest was performed following the standard protocol during bolus administration of intravenous contrast.  Contrast:  80 ml Omnipaque-300  Comparison: 09/13/2009  Findings: There is fairly extensive collateralization in the right chest wall.  The right [name] loops of relatively high in the neck before entering the internal jugular vein.  The tip of the catheter may be directed just into the left innominate vein.  There does appear to be a filling defect associated with the distal catheter suggesting the presence of an adherent thrombus.  No  lymphadenopathy in the chest.  Battery pack is seen in the anterior left chest wall with leads tracking anteriorly in the left neck, but incompletely visualized. Heart size is normal.  There is no pericardial or pleural effusion.  The lungs are clear without airspace consolidation.  No acute bony abnormality.  IMPRESSION: Collateralization in the right chest wall after right upper extremity contrast injection.  This degree of collateralization suggests a degree of central stenosis.  A filling defect is seen associated with the tip of the right [name] suggesting some adherent thrombus.  Given the heterogeneity of opacification, the extent of thrombus in the region of the innominate vein confluence cannot be assessed.  The SVC is patent.  Assessment for left innominate vein or central left venous stenosis/thrombus is not  possible as a right upper extremity injection was used for the study and venous anatomy of the left upper extremity is not opacified.  The tip of the [name] is directed just into the left innominate vein.  I discussed these findings by telephone Dr. Sharene Skeans at 1830 hours on 09/01/2011.  Original Report Authenticated By: ERIC A. MANSELL, M.D.   Results for orders placed during the hospital encounter of 09/01/11  BASIC METABOLIC PANEL      Component Value Range   Sodium 134 (*) 135 - 145 (mEq/L)   Potassium 3.3 (*) 3.5 - 5.1 (mEq/L)   Chloride 104  96 - 112 (mEq/L)   CO2 19  19 - 32 (mEq/L)   Glucose, Bld 100 (*) 70 - 99 (mg/dL)   BUN 9  6 - 23 (mg/dL)   Creatinine, Ser 1.61  0.50 - 1.35 (mg/dL)   Calcium 9.1  8.4 - 09.6 (mg/dL)   GFR calc non Af Amer >90  >90 (mL/min)   GFR calc Af Amer >90  >90 (mL/min)  CARBAMAZEPINE LEVEL, TOTAL      Component Value Range   Carbamazepine Lvl 10.8  4.0 - 12.0 (ug/mL)   Ct Head Wo Contrast  09/01/2011  *RADIOLOGY REPORT*  Clinical Data:  Fall.  Pain.  CT HEAD WITHOUT CONTRAST CT MAXILLOFACIAL WITHOUT CONTRAST CT CERVICAL SPINE WITHOUT  CONTRAST  Technique:  Multidetector CT imaging of the head, cervical spine, and maxillofacial structures were performed using the standard protocol without intravenous contrast. Multiplanar CT image reconstructions of the cervical spine and maxillofacial structures were also generated.  Comparison:  Head CT from 09/11/2009.  Cervical spine CT from 03/26/2006.  CT HEAD  Findings: There is no evidence for acute hemorrhage, hydrocephalus, mass lesion, or abnormal extra-axial fluid collection.  No definite CT evidence for acute infarction.  The patient received intravenous contrast for a CT chest earlier today and there is opacification of the dural sinuses.  As such, trace or subtle subarachnoid hemorrhage could be obscured.  The patient has coarse dystrophic calcification of the basal ganglia bilaterally, stable.  Bone windows show chronic sinus disease in the right maxillary sinus.  No evidence for skull fracture.  IMPRESSION: No acute intracranial abnormality.  Please see full report above.  CT MAXILLOFACIAL  Findings:  The mandible is intact.  Nasal bones and nasal septum are intact.  There is no evidence for zygomatic arch fracture. Pterygoid plates alveolar ridge and hard palate are without evidence for fracture.  No air fluid level the sphenoid sinuses. No medial or inferior orbital wall fracture.  The temporomandibular joints are located.  There may be soft tissue contusion of the right cheek.  IMPRESSION: No evidence for an acute facial bone fracture.  CT CERVICAL SPINE  Findings:   Imaging was obtained from the skull base through the T1- T2 interspace.  No evidence for fracture.  No subluxation. Reversal of the normal cervical lordosis is evident.  There is loss of disc height is C6-7.  Facets are well-aligned bilaterally. There is mild widening of the right C5-6 facet space, but this is unchanged since the 03/26/2006 exam.  No evidence for prevertebral soft tissue swelling.  Right [name] has been  incompletely visualized.  Vagal stimulator is seen in the left neck.  IMPRESSION: No evidence for cervical spine fracture.  Loss of cervical lordosis.  This can be related to patient positioning, muscle spasm or soft tissue injury.  Original Report Authenticated By: ERIC A. MANSELL, M.D.   Ct Chest  W Contrast  09/01/2011  *RADIOLOGY REPORT*  Clinical Data: Neck and chest swelling.  CT CHEST WITH CONTRAST  Technique:  Multidetector CT imaging of the chest was performed following the standard protocol during bolus administration of intravenous contrast.  Contrast:  80 ml Omnipaque-300  Comparison: 09/13/2009  Findings: There is fairly extensive collateralization in the right chest wall.  The right [name] loops of relatively high in the neck before entering the internal jugular vein.  The tip of the catheter may be directed just into the left innominate vein.  There does appear to be a filling defect associated with the distal catheter suggesting the presence of an adherent thrombus.  No lymphadenopathy in the chest.  Battery pack is seen in the anterior left chest wall with leads tracking anteriorly in the left neck, but incompletely visualized. Heart size is normal.  There is no pericardial or pleural effusion.  The lungs are clear without airspace consolidation.  No acute bony abnormality.  IMPRESSION: Collateralization in the right chest wall after right upper extremity contrast injection.  This degree of collateralization suggests a degree of central stenosis.  A filling defect is seen associated with the tip of the right [name] suggesting some adherent thrombus.  Given the heterogeneity of opacification, the extent of thrombus in the region of the innominate vein confluence cannot be assessed.  The SVC is patent.  Assessment for left innominate vein or central left venous stenosis/thrombus is not possible as a right upper extremity injection was used for the study and venous anatomy of the left  upper extremity is not opacified.  The tip of the [name] is directed just into the left innominate vein.  I discussed these findings by telephone Dr. Sharene Skeans at 1830 hours on 09/01/2011.  Original Report Authenticated By: ERIC A. MANSELL, M.D.   Ct Cervical Spine Wo Contrast  09/01/2011  *RADIOLOGY REPORT*  Clinical Data:  Fall.  Pain.  CT HEAD WITHOUT CONTRAST CT MAXILLOFACIAL WITHOUT CONTRAST CT CERVICAL SPINE WITHOUT CONTRAST  Technique:  Multidetector CT imaging of the head, cervical spine, and maxillofacial structures were performed using the standard protocol without intravenous contrast. Multiplanar CT image reconstructions of the cervical spine and maxillofacial structures were also generated.  Comparison:  Head CT from 09/11/2009.  Cervical spine CT from 03/26/2006.  CT HEAD  Findings: There is no evidence for acute hemorrhage, hydrocephalus, mass lesion, or abnormal extra-axial fluid collection.  No definite CT evidence for acute infarction.  The patient received intravenous contrast for a CT chest earlier today and there is opacification of the dural sinuses.  As such, trace or subtle subarachnoid hemorrhage could be obscured.  The patient has coarse dystrophic calcification of the basal ganglia bilaterally, stable.  Bone windows show chronic sinus disease in the right maxillary sinus.  No evidence for skull fracture.  IMPRESSION: No acute intracranial abnormality.  Please see full report above.  CT MAXILLOFACIAL  Findings:  The mandible is intact.  Nasal bones and nasal septum are intact.  There is no evidence for zygomatic arch fracture. Pterygoid plates alveolar ridge and hard palate are without evidence for fracture.  No air fluid level the sphenoid sinuses. No medial or inferior orbital wall fracture.  The temporomandibular joints are located.  There may be soft tissue contusion of the right cheek.  IMPRESSION: No evidence for an acute facial bone fracture.  CT CERVICAL SPINE  Findings:    Imaging was obtained from the skull base through the T1- T2 interspace.  No evidence  for fracture.  No subluxation. Reversal of the normal cervical lordosis is evident.  There is loss of disc height is C6-7.  Facets are well-aligned bilaterally. There is mild widening of the right C5-6 facet space, but this is unchanged since the 03/26/2006 exam.  No evidence for prevertebral soft tissue swelling.  Right [name] has been incompletely visualized.  Vagal stimulator is seen in the left neck.  IMPRESSION: No evidence for cervical spine fracture.  Loss of cervical lordosis.  This can be related to patient positioning, muscle spasm or soft tissue injury.  Original Report Authenticated By: ERIC A. MANSELL, M.D.   Ct Maxillofacial Wo Cm  09/01/2011  *RADIOLOGY REPORT*  Clinical Data:  Fall.  Pain.  CT HEAD WITHOUT CONTRAST CT MAXILLOFACIAL WITHOUT CONTRAST CT CERVICAL SPINE WITHOUT CONTRAST  Technique:  Multidetector CT imaging of the head, cervical spine, and maxillofacial structures were performed using the standard protocol without intravenous contrast. Multiplanar CT image reconstructions of the cervical spine and maxillofacial structures were also generated.  Comparison:  Head CT from 09/11/2009.  Cervical spine CT from 03/26/2006.  CT HEAD  Findings: There is no evidence for acute hemorrhage, hydrocephalus, mass lesion, or abnormal extra-axial fluid collection.  No definite CT evidence for acute infarction.  The patient received intravenous contrast for a CT chest earlier today and there is opacification of the dural sinuses.  As such, trace or subtle subarachnoid hemorrhage could be obscured.  The patient has coarse dystrophic calcification of the basal ganglia bilaterally, stable.  Bone windows show chronic sinus disease in the right maxillary sinus.  No evidence for skull fracture.  IMPRESSION: No acute intracranial abnormality.  Please see full report above.  CT MAXILLOFACIAL  Findings:  The mandible is  intact.  Nasal bones and nasal septum are intact.  There is no evidence for zygomatic arch fracture. Pterygoid plates alveolar ridge and hard palate are without evidence for fracture.  No air fluid level the sphenoid sinuses. No medial or inferior orbital wall fracture.  The temporomandibular joints are located.  There may be soft tissue contusion of the right cheek.  IMPRESSION: No evidence for an acute facial bone fracture.  CT CERVICAL SPINE  Findings:   Imaging was obtained from the skull base through the T1- T2 interspace.  No evidence for fracture.  No subluxation. Reversal of the normal cervical lordosis is evident.  There is loss of disc height is C6-7.  Facets are well-aligned bilaterally. There is mild widening of the right C5-6 facet space, but this is unchanged since the 03/26/2006 exam.  No evidence for prevertebral soft tissue swelling.  Right [name] has been incompletely visualized.  Vagal stimulator is seen in the left neck.  IMPRESSION: No evidence for cervical spine fracture.  Loss of cervical lordosis.  This can be related to patient positioning, muscle spasm or soft tissue injury.  Original Report Authenticated By: ERIC A. MANSELL, M.D.         MDM  Iv ns. Lab. Cts.   Discussed w dr Sharene Skeans - states no change in sz rx, rec d/c on current meds, f/u tomorrow as arranged for vascular dopperls.   Recheck remains awake and alert. No seizure activity. Recheck spine nt.      Suzi Roots, MD 09/01/11 2029

## 2011-09-01 NOTE — ED Notes (Signed)
Pt was here as an out patient to have ct of chest with contrast to evaluate status of portacath which is located in the right upper chest and a vagal nerve stimulator in the left upper chest. Pt has been swelling in his neck and facial area for approximately 4 weeks. After having cat scan and leaving the hospital, pt had a grand mal type seizure in the lobby lasting approximately 1 and 1/2 minutes according to family. Pt suffers from seizure disorder and has 2-3 seizures a week, last one this morning. According to family, the seizure activity is not abnormal, the facial trauma he sustained ahile seizing.

## 2011-09-02 ENCOUNTER — Ambulatory Visit (HOSPITAL_COMMUNITY)
Admission: RE | Admit: 2011-09-02 | Discharge: 2011-09-02 | Disposition: A | Discharge status: Home / Self Care | Payer: Medicaid Other | Source: Ambulatory Visit | Attending: Pediatrics | Admitting: Pediatrics

## 2011-09-02 ENCOUNTER — Telehealth: Payer: Self-pay

## 2011-09-02 ENCOUNTER — Other Ambulatory Visit (HOSPITAL_COMMUNITY): Payer: Self-pay | Admitting: Pediatrics

## 2011-09-02 DIAGNOSIS — I8290 Acute embolism and thrombosis of unspecified vein: Secondary | ICD-10-CM

## 2011-09-02 DIAGNOSIS — I749 Embolism and thrombosis of unspecified artery: Secondary | ICD-10-CM | POA: Insufficient documentation

## 2011-09-02 DIAGNOSIS — R609 Edema, unspecified: Secondary | ICD-10-CM

## 2011-09-02 DIAGNOSIS — R220 Localized swelling, mass and lump, head: Secondary | ICD-10-CM | POA: Insufficient documentation

## 2011-09-02 DIAGNOSIS — R221 Localized swelling, mass and lump, neck: Secondary | ICD-10-CM | POA: Insufficient documentation

## 2011-09-02 DIAGNOSIS — M7989 Other specified soft tissue disorders: Secondary | ICD-10-CM

## 2011-09-02 LAB — PROTIME-INR
INR: 1.17 (ref 0.00–1.49)
Prothrombin Time: 15.1 seconds (ref 11.6–15.2)

## 2011-09-02 LAB — ANTITHROMBIN III: AntiThromb III Func: 116 % (ref 75–120)

## 2011-09-02 LAB — APTT: aPTT: 39 seconds — ABNORMAL HIGH (ref 24–37)

## 2011-09-02 NOTE — Telephone Encounter (Signed)
Mom called - [name] was having more swelling that spread to extremities - they saw dr. Sharene Skeans yesterday - CT - reveled he has out grown his port , then did doppler - it reveled a clot. Is being placed on lovenox and coumadin, will have PT/INR done here.  To dr. Amador Cunas to inform xrays done at [facility]

## 2011-09-02 NOTE — Progress Notes (Signed)
Left upper extremity venous duplex completed at 13:20.  Preliminary report is positive for DVT in the left jugular, subclavian, and axillary veins with SVT in the left basilic vein. Smiley Houseman 09/02/2011, 2:03 PM

## 2011-09-03 LAB — LUPUS ANTICOAGULANT PANEL: PTT Lupus Anticoagulant: 70.3 secs — ABNORMAL HIGH (ref 28.0–43.0)

## 2011-09-03 LAB — CARDIOLIPIN ANTIBODIES, IGG, IGM, IGA
Anticardiolipin IgA: 7 APL U/mL — ABNORMAL LOW (ref <22)
Anticardiolipin IgG: 5 GPL U/mL — ABNORMAL LOW (ref <23)
Anticardiolipin IgM: 2 MPL U/mL — ABNORMAL LOW (ref <11)

## 2011-09-03 LAB — PROTEIN S, TOTAL: Protein S Ag, Total: 119 % (ref 60–150)

## 2011-09-03 LAB — C-REACTIVE PROTEIN: CRP: 6.53 mg/dL — ABNORMAL HIGH (ref <0.60)

## 2011-09-04 ENCOUNTER — Ambulatory Visit (INDEPENDENT_AMBULATORY_CARE_PROVIDER_SITE_OTHER): Payer: Medicaid Other | Admitting: *Deleted

## 2011-09-04 DIAGNOSIS — Z7901 Long term (current) use of anticoagulants: Secondary | ICD-10-CM

## 2011-09-04 DIAGNOSIS — I82629 Acute embolism and thrombosis of deep veins of unspecified upper extremity: Secondary | ICD-10-CM | POA: Insufficient documentation

## 2011-09-08 ENCOUNTER — Ambulatory Visit (INDEPENDENT_AMBULATORY_CARE_PROVIDER_SITE_OTHER): Payer: Medicaid Other | Admitting: *Deleted

## 2011-09-08 DIAGNOSIS — I82629 Acute embolism and thrombosis of deep veins of unspecified upper extremity: Secondary | ICD-10-CM

## 2011-09-08 DIAGNOSIS — Z7901 Long term (current) use of anticoagulants: Secondary | ICD-10-CM

## 2011-09-12 ENCOUNTER — Ambulatory Visit (INDEPENDENT_AMBULATORY_CARE_PROVIDER_SITE_OTHER): Payer: Medicaid Other | Admitting: *Deleted

## 2011-09-12 DIAGNOSIS — I82629 Acute embolism and thrombosis of deep veins of unspecified upper extremity: Secondary | ICD-10-CM

## 2011-09-12 DIAGNOSIS — Z7901 Long term (current) use of anticoagulants: Secondary | ICD-10-CM

## 2011-09-17 ENCOUNTER — Ambulatory Visit (INDEPENDENT_AMBULATORY_CARE_PROVIDER_SITE_OTHER): Payer: Medicaid Other | Admitting: *Deleted

## 2011-09-17 DIAGNOSIS — I82629 Acute embolism and thrombosis of deep veins of unspecified upper extremity: Secondary | ICD-10-CM

## 2011-09-17 DIAGNOSIS — Z7901 Long term (current) use of anticoagulants: Secondary | ICD-10-CM

## 2011-09-17 LAB — POCT INR: INR: 1.9

## 2011-09-24 ENCOUNTER — Ambulatory Visit (INDEPENDENT_AMBULATORY_CARE_PROVIDER_SITE_OTHER): Payer: Medicaid Other | Admitting: *Deleted

## 2011-09-24 DIAGNOSIS — Z7901 Long term (current) use of anticoagulants: Secondary | ICD-10-CM

## 2011-09-24 DIAGNOSIS — I82629 Acute embolism and thrombosis of deep veins of unspecified upper extremity: Secondary | ICD-10-CM

## 2011-09-24 LAB — POCT INR: INR: 1.6

## 2011-09-27 ENCOUNTER — Emergency Department (HOSPITAL_COMMUNITY)
Admission: EM | Admit: 2011-09-27 | Discharge: 2011-09-27 | Disposition: A | Discharge status: Home / Self Care | Payer: Medicaid Other | Attending: Emergency Medicine | Admitting: Emergency Medicine

## 2011-09-27 ENCOUNTER — Encounter (HOSPITAL_COMMUNITY): Payer: Self-pay | Admitting: *Deleted

## 2011-09-27 ENCOUNTER — Emergency Department (HOSPITAL_COMMUNITY): Payer: Medicaid Other

## 2011-09-27 ENCOUNTER — Other Ambulatory Visit: Payer: Self-pay

## 2011-09-27 DIAGNOSIS — I059 Rheumatic mitral valve disease, unspecified: Secondary | ICD-10-CM | POA: Insufficient documentation

## 2011-09-27 DIAGNOSIS — R0681 Apnea, not elsewhere classified: Secondary | ICD-10-CM | POA: Insufficient documentation

## 2011-09-27 DIAGNOSIS — F79 Unspecified intellectual disabilities: Secondary | ICD-10-CM | POA: Insufficient documentation

## 2011-09-27 DIAGNOSIS — E039 Hypothyroidism, unspecified: Secondary | ICD-10-CM | POA: Insufficient documentation

## 2011-09-27 DIAGNOSIS — G40909 Epilepsy, unspecified, not intractable, without status epilepticus: Secondary | ICD-10-CM | POA: Insufficient documentation

## 2011-09-27 DIAGNOSIS — R230 Cyanosis: Secondary | ICD-10-CM | POA: Insufficient documentation

## 2011-09-27 LAB — CBC
MCV: 82.9 fL (ref 78.0–100.0)
RDW: 13.5 % (ref 11.5–15.5)

## 2011-09-27 LAB — COMPREHENSIVE METABOLIC PANEL
ALT: 24 U/L (ref 0–53)
AST: 27 U/L (ref 0–37)
CO2: 20 mEq/L (ref 19–32)
Chloride: 107 mEq/L (ref 96–112)
Creatinine, Ser: 0.7 mg/dL (ref 0.50–1.35)
GFR calc non Af Amer: >90 mL/min (ref >90)
Total Bilirubin: 0.2 mg/dL — ABNORMAL LOW (ref 0.3–1.2)

## 2011-09-27 LAB — DIFFERENTIAL
Basophils Absolute: 0 10*3/uL (ref 0.0–0.1)
Lymphocytes Relative: 17 % (ref 12–46)
Monocytes Absolute: 0.4 10*3/uL (ref 0.1–1.0)
Neutro Abs: 4.9 10*3/uL (ref 1.7–7.7)

## 2011-09-27 MED ORDER — SODIUM CHLORIDE 0.9 % IV BOLUS (SEPSIS)
500.0000 mL | Freq: Once | INTRAVENOUS | Status: AC
  Administered 2011-09-27: 500 mL via INTRAVENOUS

## 2011-09-27 MED ORDER — IBUPROFEN 200 MG PO TABS
600.0000 mg | ORAL_TABLET | Freq: Once | ORAL | Status: AC
  Administered 2011-09-27: 600 mg via ORAL
  Filled 2011-09-27: qty 3

## 2011-09-27 NOTE — ED Notes (Signed)
Family at bedside. 

## 2011-09-27 NOTE — ED Provider Notes (Signed)
7:07 PM  I performed a history and physical examination of [name] and discussed his management with Dr. Gwendolyn Grant.  I agree with the history, physical, assessment, and plan of care, with the following exceptions: None  The patient is currently awake, alert, and oriented to his baseline mental status, and in no apparent distress. He is comfortably watching the football game and commenting on the action. He is breathing easily with no respiratory distress. There is a bruise on the right side of his chest just adjacent to the sternum but no other deformity and no crepitus to the chest wall. His lung sounds are clear throughout all fields with good air exchange. His heart sounds are normal with a regular rate and rhythm. No further seizures while in the emergency department  I was present for the following procedures: None Time Spent in Critical Care of the patient: None Time spent in discussions with the patient and family: 5 minutes  Nishant Schrecengost D    Felisa Bonier, MD 09/27/11 1910

## 2011-09-27 NOTE — ED Notes (Signed)
MD at bedside. 

## 2011-09-27 NOTE — ED Notes (Signed)
PT care giver reports post seizure the PT  Was not breathing and no pulse was felt. PT CG preformed CPR  At home. Pt began breathing on own with a pulse. Pt was Alert  When EMS arrived.

## 2011-09-27 NOTE — ED Provider Notes (Signed)
History     CSN: [id number]  Arrival date & time 09/27/11  1656   First MD Initiated Contact with Patient 09/27/11 1702      Chief Complaint  Patient presents with  . Seizures    Family preformed CPR    (Consider location/radiation/quality/duration/timing/severity/associated sxs/prior treatment) HPI Comments: Patient had tonic seizure while laying in the floor next a sister. Patient went apneic and turned blue. This is normal for his seizure pattern. Patient however did not begin breathing. Sister at that time checked his pulse and could not find one. She initiated CPR for 2 minutes. She also gave him 2 rescue breaths. His seizure stopped once EMS arrived.  History of seizures that are very difficult to control. Has a vagal nerve stimulator and is also on 4 anti-epileptic medications.  Patient is a 25 y.o. male presenting with seizures. The history is provided by the patient and a relative.  Seizures  This is a chronic problem. The current episode started less than 1 hour ago. The problem has been resolved. There was 1 seizure. The most recent episode lasted 2 to 5 minutes. Pertinent negatives include no headaches, no speech difficulty, no cough, no nausea, no vomiting and no diarrhea. Characteristics include bladder incontinence, loss of consciousness, bit tongue, apnea and cyanosis. Characteristics do not include rhythmic jerking (Has Tonic Seizures). The episode was witnessed. There was no sensation of an aura present. The seizures did not continue in the ED. The seizure(s) had no focality. Possible causes include recent illness (Recent upper respiratory illness. 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 Possible causes do not include med or dosage change, sleep deprivation or missed seizure meds. There has been no fever. There were no medications administered prior to arrival.    Past Medical History  Diagnosis Date  . ALLERGIC RHINITIS 12/26/2009  . MITRAL VALVE  PROLAPSE 06/22/2007  . SEIZURE DISORDER 06/22/2007  . Unspecified hypothyroidism 06/22/2007  . Growth disorder   . Mental retardation, mild (I.Q. 50-70)   . Brown's syndrome     Past Surgical History  Procedure Date  . Implantation vagal nerve stimulator     History reviewed. No pertinent family history.  History  Substance Use Topics  . Smoking status: Never Smoker   . Smokeless tobacco: Not on file  . Alcohol Use: No      Review of Systems  Respiratory: Positive for apnea. Negative for cough.   Cardiovascular: Positive for cyanosis.  Gastrointestinal: Negative for nausea, vomiting and diarrhea.  Genitourinary: Positive for bladder incontinence.  Neurological: Positive for seizures and loss of consciousness. Negative for speech difficulty and headaches.  All other systems reviewed and are negative.    Allergies  Divalproex sodium and Phenytoin  Home Medications   Current Outpatient Rx  Name Route Sig Dispense Refill  . ACETAMINOPHEN 500 MG PO TABS Oral Take 500 mg by mouth as needed. For pain/congestion     . CARBAMAZEPINE 100 MG PO CHEW Oral Chew 250 mg by mouth 3 (three) times daily. Take 2.5 tablets at 7a,130p,and 9p     . CETIRIZINE HCL 10 MG PO TABS Oral Take 10 mg by mouth daily.      Marland Kitchen DIAZEPAM 2 MG PO TABS Oral Take 2 mg by mouth as needed. Take as needed when having sensation seizure.     Marland Kitchen LACOSAMIDE 10 MG/ML PO SOLN Oral Take 3 mLs by mouth 3 (three) times daily. 2.5 cc three times a day    .  LEVETIRACETAM 500 MG PO TABS Oral Take 1,500 mg by mouth 2 (two) times daily.      Marland Kitchen LEVOTHYROXINE SODIUM 50 MCG PO TABS Oral Take 50 mcg by mouth daily.      Marland Kitchen POTASSIUM CHLORIDE CRYS ER 20 MEQ PO TBCR Oral Take 20 mEq by mouth 4 (four) times daily. Take at 7a,130p,5p,and 9p    . POTASSIUM PHOSPHATE MONOBASIC 500 MG PO TABS Oral Take 500 mg by mouth 4 (four) times daily. Take at 7a, 130p, 5p, and 9p    . TOPIRAMATE 100 MG PO TABS Oral Take 150 mg by mouth 3 (three)  times daily. Take at 7a, 130p, and 9p    . WARFARIN SODIUM 5 MG PO TABS Oral Take 5 mg by mouth daily. Take one tablet (5mg ) everyday except Fri, and take one-half (2.5mg ) on Friday.       BP 105/56  Pulse 102  Temp(Src) 98.3 F (36.8 C) (Oral)  Resp 18  SpO2 100%  Physical Exam  Nursing note and vitals reviewed. Constitutional: He is oriented to person, place, and time. He appears well-developed and well-nourished. No distress.  HENT:  Head: Normocephalic and atraumatic.  Mouth/Throat: No oropharyngeal exudate.  Eyes: EOM are normal. Pupils are equal, round, and reactive to light.  Neck: Normal range of motion. Neck supple.       Mild neck swelling.  Cardiovascular: Regular rhythm.  Tachycardia present.  Exam reveals no friction rub.   No murmur heard. Pulmonary/Chest: Effort normal and breath sounds normal. No respiratory distress. He has no wheezes. He has no rales. He exhibits tenderness (Lower right and left sides.).       No splinting, paradoxical motion, respiratory distress. Equal breath sounds bilaterally. No crepitus  Abdominal: He exhibits no distension. There is no tenderness. There is no rebound.  Musculoskeletal: Normal range of motion. He exhibits no edema.  Neurological: He is alert and oriented to person, place, and time. No cranial nerve deficit. He exhibits normal muscle tone. Coordination normal.  Skin: Skin is warm and dry. He is not diaphoretic.    ED Course  Procedures (including critical care time)   Labs Reviewed  CBC  DIFFERENTIAL  COMPREHENSIVE METABOLIC PANEL  TOPIRAMATE LEVEL  CARBAMAZEPINE LEVEL, TOTAL  PROTIME-INR  TROPONIN I   Dg Chest 2 View  09/27/2011  *RADIOLOGY REPORT*  Clinical Data: Seizure, CPR  CHEST - 2 VIEW  Comparison: CT chest dated 09/01/2011  Findings: Lungs are clear. No pleural effusion or pneumothorax.  Cardiomediastinal silhouette is within normal limits.  Right chest port.  Left chest stimulator device.  Visualized osseous  structures are within normal limits.  IMPRESSION: No evidence of acute cardiopulmonary disease.  Original Report Authenticated By: Charline Bills, M.D.   Ct Head Wo Contrast  09/27/2011  *RADIOLOGY REPORT*  Clinical Data: 26 year old male with seizure.  CPR.  CT HEAD WITHOUT CONTRAST  Technique:  Contiguous axial images were obtained from the base of the skull through the vertex without contrast.  Comparison: 09/01/2011 and prior head CTs.  Findings: Dense basal ganglia calcifications are again identified.  No acute intracranial abnormalities are identified, including mass lesion or mass effect, hydrocephalus, extra-axial fluid collection, midline shift, hemorrhage, or acute infarction.  The visualized bony calvarium is unremarkable.  IMPRESSION: No evidence of acute intracranial abnormality.  Original Report Authenticated By: Rosendo Gros, M.D.     1. Epilepsy      Date: 09/27/2011  Rate: 110  Rhythm: sinus tachycardia  QRS Axis:  normal  Intervals: normal  ST/T Wave abnormalities: normal  Conduction Disutrbances:none  Narrative Interpretation: Q waves in I and avL, unchanged from previous.  Old EKG Reviewed: increased rate    MDM  26 year old male presents after seizure. Went apneic after a tonic seizure, which is normal for him. Patient however did not resumed breathing. Sister did CPR. EMS arrived and seizure had stopped. No further CPR was carried on. In the ER, patient alert answering questions. He has a history of mild mental retardation. Parents say he is groggy however acting fairly normal. Patient neuro intact. No chest tenderness, paradoxical motion, splinting, or crepitus. With with seizures worsening over the past few days will obtain head CT and levels of his medications. The patient had a bad seizure 5 days ago which he did not come to the emergency room for. 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  Plan to call patient's neurologist and discussed the case with him. CT head reviewed by me, read as above. No evidence of any acute changes. Chest x-ray without any evidence of trauma to the chest. Troponin negative. Labs show a mild hypokalemia at 3.3. Carbamazepine level therapeutic at 9.1. Spoke with patient's neurologist, Dr. Sharene Skeans, concerning patient's seizure today. Dr. Sharene Skeans states this is typical for him. There is no benefit to admission. PET degrees no admission warranted. Patient feeling better and doing well at this time. Eagerly watching the Packers versus the Vikings on TV.  Patient states he has a followup appointment Dr. Sharene Skeans on Wednesday which is 4 days away. Patient discharged home in stable condition.   Elwin Mocha, MD 09/27/11 2352  Elwin Mocha, MD 09/27/11 [phone]

## 2011-10-03 ENCOUNTER — Ambulatory Visit (INDEPENDENT_AMBULATORY_CARE_PROVIDER_SITE_OTHER): Payer: Medicaid Other | Admitting: *Deleted

## 2011-10-03 DIAGNOSIS — Z7901 Long term (current) use of anticoagulants: Secondary | ICD-10-CM

## 2011-10-03 DIAGNOSIS — I82629 Acute embolism and thrombosis of deep veins of unspecified upper extremity: Secondary | ICD-10-CM

## 2011-10-03 LAB — POCT INR: INR: 2.3

## 2011-10-03 NOTE — ED Provider Notes (Signed)
Evaluation and management procedures were performed by the resident physician under my supervision/collaboration.  I evaluated this patient face-to-face at the time of encounter.  Please see my note dated at that time.   Felisa Bonier, MD 10/03/11 [phone]

## 2011-10-17 ENCOUNTER — Ambulatory Visit (INDEPENDENT_AMBULATORY_CARE_PROVIDER_SITE_OTHER): Payer: Medicaid Other | Admitting: *Deleted

## 2011-10-17 ENCOUNTER — Encounter: Payer: Medicaid Other | Admitting: *Deleted

## 2011-10-17 DIAGNOSIS — Z7901 Long term (current) use of anticoagulants: Secondary | ICD-10-CM

## 2011-10-17 DIAGNOSIS — I82629 Acute embolism and thrombosis of deep veins of unspecified upper extremity: Secondary | ICD-10-CM

## 2011-10-17 LAB — POCT INR: INR: 1.8

## 2011-10-31 ENCOUNTER — Ambulatory Visit (INDEPENDENT_AMBULATORY_CARE_PROVIDER_SITE_OTHER): Payer: Medicaid Other | Admitting: *Deleted

## 2011-10-31 DIAGNOSIS — Z7901 Long term (current) use of anticoagulants: Secondary | ICD-10-CM

## 2011-10-31 DIAGNOSIS — I82629 Acute embolism and thrombosis of deep veins of unspecified upper extremity: Secondary | ICD-10-CM

## 2011-10-31 LAB — POCT INR: INR: 1.7

## 2011-11-14 ENCOUNTER — Ambulatory Visit (INDEPENDENT_AMBULATORY_CARE_PROVIDER_SITE_OTHER): Payer: Medicaid Other | Admitting: *Deleted

## 2011-11-14 DIAGNOSIS — Z7901 Long term (current) use of anticoagulants: Secondary | ICD-10-CM

## 2011-11-14 DIAGNOSIS — I82629 Acute embolism and thrombosis of deep veins of unspecified upper extremity: Secondary | ICD-10-CM

## 2011-11-14 LAB — POCT INR: INR: 1.8

## 2011-11-28 ENCOUNTER — Ambulatory Visit (INDEPENDENT_AMBULATORY_CARE_PROVIDER_SITE_OTHER): Payer: Medicaid Other

## 2011-11-28 DIAGNOSIS — Z7901 Long term (current) use of anticoagulants: Secondary | ICD-10-CM

## 2011-11-28 DIAGNOSIS — I82629 Acute embolism and thrombosis of deep veins of unspecified upper extremity: Secondary | ICD-10-CM

## 2011-11-28 LAB — POCT INR: INR: 1.6

## 2011-12-08 ENCOUNTER — Encounter (HOSPITAL_COMMUNITY): Payer: Self-pay | Admitting: *Deleted

## 2011-12-08 ENCOUNTER — Emergency Department (HOSPITAL_COMMUNITY)
Admission: EM | Admit: 2011-12-08 | Discharge: 2011-12-08 | Disposition: A | Discharge status: Home / Self Care | Payer: Medicaid Other | Attending: Emergency Medicine | Admitting: Emergency Medicine

## 2011-12-08 DIAGNOSIS — E039 Hypothyroidism, unspecified: Secondary | ICD-10-CM | POA: Insufficient documentation

## 2011-12-08 DIAGNOSIS — R569 Unspecified convulsions: Secondary | ICD-10-CM

## 2011-12-08 DIAGNOSIS — I499 Cardiac arrhythmia, unspecified: Secondary | ICD-10-CM | POA: Insufficient documentation

## 2011-12-08 DIAGNOSIS — I059 Rheumatic mitral valve disease, unspecified: Secondary | ICD-10-CM | POA: Insufficient documentation

## 2011-12-08 DIAGNOSIS — F70 Mild intellectual disabilities: Secondary | ICD-10-CM | POA: Insufficient documentation

## 2011-12-08 LAB — DIFFERENTIAL
Eosinophils Relative: 0 % (ref 0–5)
Lymphocytes Relative: 22 % (ref 12–46)
Lymphs Abs: 1.2 10*3/uL (ref 0.7–4.0)

## 2011-12-08 LAB — PROTIME-INR: INR: 1.9 — ABNORMAL HIGH (ref 0.00–1.49)

## 2011-12-08 LAB — CBC
Hemoglobin: 11.9 g/dL — ABNORMAL LOW (ref 13.0–17.0)
MCV: 81.9 fL (ref 78.0–100.0)
Platelets: 206 10*3/uL (ref 150–400)
RBC: 4.25 MIL/uL (ref 4.22–5.81)
WBC: 5.6 10*3/uL (ref 4.0–10.5)

## 2011-12-08 LAB — CARBAMAZEPINE LEVEL, TOTAL: Carbamazepine Lvl: 7.4 ug/mL (ref 4.0–12.0)

## 2011-12-08 LAB — BASIC METABOLIC PANEL
CO2: 18 mEq/L — ABNORMAL LOW (ref 19–32)
Calcium: 8.4 mg/dL (ref 8.4–10.5)
Chloride: 109 mEq/L (ref 96–112)
Potassium: 3.3 mEq/L — ABNORMAL LOW (ref 3.5–5.1)
Sodium: 137 mEq/L (ref 135–145)

## 2011-12-08 LAB — PHENOBARBITAL LEVEL: Phenobarbital: <2.4 ug/mL — ABNORMAL LOW (ref 15.0–40.0)

## 2011-12-08 LAB — GLUCOSE, CAPILLARY: Glucose-Capillary: 90 mg/dL (ref 70–99)

## 2011-12-08 NOTE — ED Provider Notes (Signed)
History     CSN: [id number]  Arrival date & time 12/08/11  4098   First MD Initiated Contact with Patient 12/08/11 1049      Chief Complaint  Patient presents with  . Seizures    (Consider location/radiation/quality/duration/timing/severity/associated sxs/prior treatment) Patient is a 26 y.o. male presenting with seizures. The history is provided by the patient and a relative.  Seizures  This is a recurrent problem. The current episode started 1 to 2 hours ago. The problem has not changed since onset.There was 1 seizure. Pertinent negatives include no sleepiness, no confusion, no chest pain and no vomiting. Stiffening of body The episode was witnessed. There was no sensation of an aura present. The seizure(s) had no focality. There has been no fever. Associated symptoms comments: He stopped breathing for 2 minutes, during the seizure. There were no medications administered prior to arrival.   The family called 911, the patient was transferred by EMS. The family reports that the patient woke up to his baseline prior to departure of the ambulance from home. The patient had a similar seizure 2 months ago with a prolonged episode of apnea, for 3 minutes, during which he received bystander CPR. There have been no recent changes in diet, medication use, or concurrent illnesses.  Past Medical History  Diagnosis Date  . ALLERGIC RHINITIS 12/26/2009  . MITRAL VALVE PROLAPSE 06/22/2007  . SEIZURE DISORDER 06/22/2007  . Unspecified hypothyroidism 06/22/2007  . Growth disorder   . Mental retardation, mild (I.Q. 50-70)   . Brown's syndrome   . Irregular heart beat     Past Surgical History  Procedure Date  . Implantation vagal nerve stimulator   . Portacath placement     No family history on file.  History  Substance Use Topics  . Smoking status: Never Smoker   . Smokeless tobacco: Not on file  . Alcohol Use: No      Review of Systems  Cardiovascular: Negative for chest pain.    Gastrointestinal: Negative for vomiting.  Neurological: Positive for seizures.  Psychiatric/Behavioral: Negative for confusion.  All other systems reviewed and are negative.    Allergies  Divalproex sodium and Phenytoin  Home Medications   Current Outpatient Rx  Name Route Sig Dispense Refill  . ACETAMINOPHEN 500 MG PO TABS Oral Take 500 mg by mouth as needed. For pain/congestion     . CARBAMAZEPINE 100 MG PO CHEW Oral Chew 250 mg by mouth 3 (three) times daily. Take 2.5 tablets at 7a,130p,and 9p     . DIAZEPAM 2 MG PO TABS Oral Take 2 mg by mouth as needed. Take as needed when having sensation seizure.     Marland Kitchen DIAZEPAM 10 MG/2ML IM DEVI Intramuscular Inject 10 mg into the muscle daily as needed. For seizures    . LACOSAMIDE 10 MG/ML PO SOLN Oral Take 3 mLs by mouth 3 (three) times daily.     Marland Kitchen LEVETIRACETAM 500 MG PO TABS Oral Take 1,500 mg by mouth 2 (two) times daily.      Marland Kitchen LEVOTHYROXINE SODIUM 50 MCG PO TABS Oral Take 50 mcg by mouth daily.      Marland Kitchen LORATADINE 10 MG PO TABS Oral Take 10 mg by mouth daily.    Marland Kitchen LORAZEPAM 2 MG/ML IJ SOLN Intravenous Inject 2 mg into the vein every 8 (eight) hours as needed. For seizure/ anxiety    . PHENOBARBITAL SODIUM 60 MG/ML IJ SOLN Intravenous Inject 60 mg into the vein daily as needed. For seizures    .  POTASSIUM CHLORIDE CRYS ER 20 MEQ PO TBCR Oral Take 20 mEq by mouth 4 (four) times daily. Take at 7a,130p,5p,and 9p    . POTASSIUM PHOSPHATE MONOBASIC 500 MG PO TABS Oral Take 500 mg by mouth 4 (four) times daily. Take at 7a, 130p, 5p, and 9p    . TOPIRAMATE 100 MG PO TABS Oral Take 150 mg by mouth 3 (three) times daily. Take at 7a, 130p, and 9p    . WARFARIN SODIUM 5 MG PO TABS Oral Take 5 mg by mouth daily. Take 1 tablet daily excepts take 1 1/2 tablet on monday      BP 93/52  Pulse 79  Temp(Src) 97.9 F (36.6 C) (Oral)  Resp 18  Ht 4\' 7"  (1.397 m)  Wt 98 lb (44.453 kg)  BMI 22.78 kg/m2  SpO2 100%  Physical Exam  Constitutional: He  appears well-developed and well-nourished.  HENT:  Head: Normocephalic and atraumatic.       No tongue abrasion or laceration  Eyes: Conjunctivae and EOM are normal. Pupils are equal, round, and reactive to light.  Neck: Normal range of motion. Neck supple.  Cardiovascular: Normal rate, regular rhythm and intact distal pulses.        2/6 systolic murmur left upper sternal border  Pulmonary/Chest: Effort normal and breath sounds normal.  Abdominal: Soft. Bowel sounds are normal.  Musculoskeletal:       No deformity  Neurological: He is alert.       Mild spasticity, RPR; moderate spasticity of the leg. No focal abnormalities  Psychiatric: He has a normal mood and affect. His behavior is normal.    ED Course  Procedures (including critical care time)  Labs Reviewed  BASIC METABOLIC PANEL - Abnormal; Notable for the following:    Potassium 3.3 (*)    CO2 18 (*)    All other components within normal limits  CBC - Abnormal; Notable for the following:    Hemoglobin 11.9 (*)    HCT 34.8 (*)    All other components within normal limits  PHENOBARBITAL LEVEL - Abnormal; Notable for the following:    Phenobarbital <2.4 (*)    All other components within normal limits  PROTIME-INR - Abnormal; Notable for the following:    Prothrombin Time 22.1 (*)    INR 1.90 (*)    All other components within normal limits  DIFFERENTIAL  CARBAMAZEPINE LEVEL, TOTAL  GLUCOSE, CAPILLARY  LEVETIRACETAM LEVEL   No results found.  No Sz. In ED.  Case discused with Dr Sharene Skeans- he states no change indicated for treatments.  1. Seizure       MDM  Epilepsy with sz, returned quickly to baseline. Stable for d.c.            Flint Melter, MD 12/08/11 1725

## 2011-12-08 NOTE — ED Notes (Signed)
Pt undressed and in gown; family at bedside 

## 2011-12-08 NOTE — ED Notes (Signed)
Seizure pads placed on bedrails; pt sitting up on stretcher; family at bedside

## 2011-12-08 NOTE — ED Notes (Signed)
Witnessed seizure activity while in bed lasting approx 2 minutes. Parents concerned when pt was not breathing & lips turning blue.  Post ictal upon EMS arrival with adequate respirations.

## 2011-12-08 NOTE — ED Notes (Signed)
Patient is resting comfortably. Family at bedside. No voiced complaints presently. NAD. No seizure activity noted. Playing checkers with family.  Informed patient and/or family of status.

## 2011-12-08 NOTE — Discharge Instructions (Signed)
The INR level today was 1.9. Dr. Sharene Skeans does not recommend any changes to the treatment plan. Continue the usual home treatments. Return here if needed for problems.  Epilepsy A seizure (convulsion) is a sudden change in brain function that causes a change in behavior, muscle activity, or ability to remain awake and alert. If a person has recurring seizures, this is called epilepsy. CAUSES  Epilepsy is a disorder with many possible causes. Anything that disturbs the normal pattern of brain cell activity can lead to seizures. Seizure can be caused from illness to brain damage to abnormal brain development. Epilepsy may develop because of:  An abnormality in brain wiring.   An imbalance of nerve signaling chemicals (neurotransmitters).   Some combination of these factors.  Scientists are learning an increasing amount about genetic causes of seizures. SYMPTOMS  The symptoms of a seizure can vary greatly from one person to another. These may include:  An aura, or warning that tells a person they are about to have a seizure.   Abnormal sensations, such as abnormal smell or seeing flashing lights.   Sudden, general body stiffness.   Rhythmic jerking of the face, arm, or leg - on one or both sides.   Sudden change in consciousness.   The person may appear to be awake but not responding.   They may appear to be asleep but cannot be awakened.   Grimacing, chewing, lip smacking, or drooling.   Often there is a period of sleepiness after a seizure.  DIAGNOSIS  The description you give to your caregiver about what you experienced will help them understand your problems. Equally important is the description by any witnesses to your seizure. A physical exam, including a detailed neurological exam, is necessary. An EEG (electroencephalogram) is a painless test of your brain waves. In this test a diagram is created of your brain waves. These diagrams can be interpreted by a specialist. Pictures  of your brain are usually taken with:  An MRI.   A CT scan.  Lab tests may be done to look for:  Signs of infection.   Abnormal blood chemistry.  PREVENTION  There is no way to prevent the development of epilepsy. If you have seizures that are typically triggered by an event (such as flashing lights), try to avoid the trigger. This can help you avoid a seizure.  PROGNOSIS  Most people with epilepsy lead outwardly normal lives. While epilepsy cannot currently be cured, for some people it does eventually go away. Most seizures do not cause brain damage. It is not uncommon for people with epilepsy, especially children, to develop behavioral and emotional problems. These problems are sometimes the consequence of medicine for seizures or social stress. For some people with epilepsy, the risk of seizures restricts their independence and recreational activities. For example, some states refuse drivers licenses to people with epilepsy. Most women with epilepsy can become pregnant. They should discuss their epilepsy and the medicine they are taking with their caregivers. Women with epilepsy have a 90 percent or better chance of having a normal, healthy baby. RISKS AND COMPLICATIONS  People with epilepsy are at increased risk of falls, accidents, and injuries. People with epilepsy are at special risk for two life-threatening conditions. These are status epilepticus and sudden unexplained death (extremely rare). Status epilepticus is a long lasting, continuous seizure that is a medical emergency. TREATMENT  Once epilepsy is diagnosed, it is important to begin treatment as soon as possible. For about 80 percent of  those diagnosed with epilepsy, seizures can be controlled with modern medicines and surgical techniques. Some antiepileptic drugs can interfere with the effectiveness of oral contraceptives. In 1997, the FDA approved a pacemaker for the brain the (vagus nerve stimulator). This stimulator can be used  for people with seizures that are not well-controlled by medicine. Studies have shown that in some cases, children may experience fewer seizures if they maintain a strict diet. The strict diet is called the ketogenic diet. This diet is rich in fats and low in carbohydrates. HOME CARE INSTRUCTIONS   Your caregiver will make recommendations about driving and safety in normal activities. Follow these carefully.   Take any medicine prescribed exactly as directed.   Do any blood tests requested to monitor the levels of your medicine.   The people you live and work with should know that you are prone to seizures. They should receive instructions on how to help you. In general, a witness to a seizure should:   Cushion your head and body.   Turn you on your side.   Avoid unnecessarily restraining you.   Not place anything inside your mouth.   Call for local emergency medical help if there is any question about what has occurred.   Keep a seizure diary. Record what you recall about any seizure, especially any possible trigger.   If your caregiver has given you a follow-up appointment, it is very important to keep that appointment. Not keeping the appointment could result in permanent injury and disability. If there is any problem keeping the appointment, you must call back to this facility for assistance.  SEEK MEDICAL CARE IF:   You develop signs of infection or other illness. This might increase the risk of a seizure.   You seem to be having more frequent seizures.   Your seizure pattern is changing.  SEEK IMMEDIATE MEDICAL CARE IF:   A seizure does not stop after a few moments.   A seizure causes any difficulty in breathing.   A seizure results in a very severe headache.   A seizure leaves you with the inability to speak or use a part of your body.  MAKE SURE YOU:   Understand these instructions.   Will watch your condition.   Will get help right away if you are not doing well  or get worse.  Document Released: 09/08/2005 Document Revised: 08/28/2011 Document Reviewed: 04/14/2008 [facility] Patient Information 2012 [place], [place].

## 2011-12-08 NOTE — ED Notes (Signed)
POCT CBG resulted 90; Monica, RN notified

## 2011-12-08 NOTE — ED Notes (Signed)
Family at bedside. Informed patient and/or family of status. No voiced complaints presently. NAD. Awaiting test results.

## 2011-12-12 ENCOUNTER — Ambulatory Visit (INDEPENDENT_AMBULATORY_CARE_PROVIDER_SITE_OTHER): Payer: Medicaid Other | Admitting: Pharmacist

## 2011-12-12 DIAGNOSIS — Z7901 Long term (current) use of anticoagulants: Secondary | ICD-10-CM

## 2011-12-12 DIAGNOSIS — I82629 Acute embolism and thrombosis of deep veins of unspecified upper extremity: Secondary | ICD-10-CM

## 2011-12-26 ENCOUNTER — Ambulatory Visit (INDEPENDENT_AMBULATORY_CARE_PROVIDER_SITE_OTHER): Payer: Medicaid Other

## 2011-12-26 DIAGNOSIS — Z7901 Long term (current) use of anticoagulants: Secondary | ICD-10-CM

## 2011-12-26 DIAGNOSIS — I82629 Acute embolism and thrombosis of deep veins of unspecified upper extremity: Secondary | ICD-10-CM

## 2012-01-08 ENCOUNTER — Encounter (HOSPITAL_COMMUNITY): Payer: Self-pay | Admitting: *Deleted

## 2012-01-08 ENCOUNTER — Observation Stay (HOSPITAL_COMMUNITY)
Admission: EM | Admit: 2012-01-08 | Discharge: 2012-01-10 | Disposition: A | Discharge status: Home / Self Care | Payer: Medicaid Other | Attending: Neurology | Admitting: Neurology

## 2012-01-08 ENCOUNTER — Emergency Department (HOSPITAL_COMMUNITY): Payer: Medicaid Other

## 2012-01-08 DIAGNOSIS — G40409 Other generalized epilepsy and epileptic syndromes, not intractable, without status epilepticus: Secondary | ICD-10-CM

## 2012-01-08 DIAGNOSIS — R0681 Apnea, not elsewhere classified: Secondary | ICD-10-CM | POA: Insufficient documentation

## 2012-01-08 DIAGNOSIS — G40309 Generalized idiopathic epilepsy and epileptic syndromes, not intractable, without status epilepticus: Principal | ICD-10-CM | POA: Insufficient documentation

## 2012-01-08 DIAGNOSIS — E039 Hypothyroidism, unspecified: Secondary | ICD-10-CM | POA: Insufficient documentation

## 2012-01-08 DIAGNOSIS — J309 Allergic rhinitis, unspecified: Secondary | ICD-10-CM | POA: Insufficient documentation

## 2012-01-08 DIAGNOSIS — I82629 Acute embolism and thrombosis of deep veins of unspecified upper extremity: Secondary | ICD-10-CM

## 2012-01-08 DIAGNOSIS — F79 Unspecified intellectual disabilities: Secondary | ICD-10-CM | POA: Insufficient documentation

## 2012-01-08 DIAGNOSIS — G40209 Localization-related (focal) (partial) symptomatic epilepsy and epileptic syndromes with complex partial seizures, not intractable, without status epilepticus: Secondary | ICD-10-CM | POA: Diagnosis not present

## 2012-01-08 DIAGNOSIS — G40109 Localization-related (focal) (partial) symptomatic epilepsy and epileptic syndromes with simple partial seizures, not intractable, without status epilepticus: Secondary | ICD-10-CM | POA: Diagnosis present

## 2012-01-08 DIAGNOSIS — Y849 Medical procedure, unspecified as the cause of abnormal reaction of the patient, or of later complication, without mention of misadventure at the time of the procedure: Secondary | ICD-10-CM | POA: Insufficient documentation

## 2012-01-08 DIAGNOSIS — T82598A Other mechanical complication of other cardiac and vascular devices and implants, initial encounter: Secondary | ICD-10-CM | POA: Insufficient documentation

## 2012-01-08 DIAGNOSIS — H5060 Mechanical strabismus, unspecified: Secondary | ICD-10-CM | POA: Insufficient documentation

## 2012-01-08 DIAGNOSIS — Z7901 Long term (current) use of anticoagulants: Secondary | ICD-10-CM | POA: Insufficient documentation

## 2012-01-08 HISTORY — DX: Sleep apnea, unspecified: G47.30

## 2012-01-08 HISTORY — DX: Cardiac murmur, unspecified: R01.1

## 2012-01-08 LAB — PROTIME-INR
INR: 1.64 — ABNORMAL HIGH (ref 0.00–1.49)
Prothrombin Time: 19.7 seconds — ABNORMAL HIGH (ref 11.6–15.2)

## 2012-01-08 LAB — URINALYSIS, ROUTINE W REFLEX MICROSCOPIC
Bilirubin Urine: NEGATIVE
Glucose, UA: 100 mg/dL — AB
Hgb urine dipstick: NEGATIVE
Ketones, ur: NEGATIVE mg/dL
Protein, ur: NEGATIVE mg/dL
Urobilinogen, UA: 0.2 mg/dL (ref 0.0–1.0)

## 2012-01-08 MED ORDER — POTASSIUM PHOSPHATE MONOBASIC 500 MG PO TABS
500.0000 mg | ORAL_TABLET | Freq: Three times a day (TID) | ORAL | Status: DC
  Administered 2012-01-09 – 2012-01-10 (×3): 500 mg via ORAL
  Filled 2012-01-08 (×6): qty 1

## 2012-01-08 MED ORDER — LEVETIRACETAM 750 MG PO TABS
1500.0000 mg | ORAL_TABLET | ORAL | Status: AC
  Filled 2012-01-08: qty 2

## 2012-01-08 MED ORDER — WARFARIN - PHARMACIST DOSING INPATIENT: Freq: Every day | Status: DC

## 2012-01-08 MED ORDER — SODIUM CHLORIDE 0.9 % IV SOLN
500.0000 mg | INTRAVENOUS | Status: AC
  Administered 2012-01-08: 500 mg via INTRAVENOUS
  Filled 2012-01-08: qty 5

## 2012-01-08 MED ORDER — LACOSAMIDE 50 MG PO TABS
50.0000 mg | ORAL_TABLET | ORAL | Status: AC
Start: 2012-01-08 — End: 2012-01-09
  Filled 2012-01-08: qty 1

## 2012-01-08 MED ORDER — LORATADINE 10 MG PO TABS
10.0000 mg | ORAL_TABLET | Freq: Every day | ORAL | Status: DC
  Administered 2012-01-09 – 2012-01-10 (×2): 10 mg via ORAL
  Filled 2012-01-08 (×2): qty 1

## 2012-01-08 MED ORDER — SODIUM CHLORIDE 0.9 % IV SOLN: Freq: Once | INTRAVENOUS | Status: DC

## 2012-01-08 MED ORDER — POTASSIUM CHLORIDE CRYS ER 20 MEQ PO TBCR
20.0000 meq | EXTENDED_RELEASE_TABLET | Freq: Four times a day (QID) | ORAL | Status: DC
  Administered 2012-01-09 (×3): 20 meq via ORAL
  Administered 2012-01-09: 14:00:00 via ORAL
  Administered 2012-01-10 (×2): 20 meq via ORAL
  Filled 2012-01-08 (×10): qty 1

## 2012-01-08 MED ORDER — LACOSAMIDE 50 MG PO TABS
50.0000 mg | ORAL_TABLET | Freq: Three times a day (TID) | ORAL | Status: DC
  Administered 2012-01-09 – 2012-01-10 (×5): 50 mg via ORAL
  Filled 2012-01-08 (×5): qty 1

## 2012-01-08 MED ORDER — ONDANSETRON HCL 4 MG PO TABS: 4.0000 mg | ORAL_TABLET | Freq: Four times a day (QID) | ORAL | Status: DC | PRN

## 2012-01-08 MED ORDER — LORAZEPAM 2 MG/ML IJ SOLN
2.0000 mg | Freq: Three times a day (TID) | INTRAMUSCULAR | Status: DC | PRN
  Administered 2012-01-09: 2 mg via INTRAVENOUS
  Filled 2012-01-08: qty 1

## 2012-01-08 MED ORDER — ACETAMINOPHEN 650 MG RE SUPP: 650.0000 mg | Freq: Four times a day (QID) | RECTAL | Status: DC | PRN

## 2012-01-08 MED ORDER — TOPIRAMATE 25 MG PO TABS
150.0000 mg | ORAL_TABLET | Freq: Three times a day (TID) | ORAL | Status: DC
  Administered 2012-01-09 – 2012-01-10 (×5): 150 mg via ORAL
  Filled 2012-01-08 (×8): qty 2

## 2012-01-08 MED ORDER — TOPIRAMATE 25 MG PO TABS
150.0000 mg | ORAL_TABLET | ORAL | Status: AC
  Filled 2012-01-08: qty 2

## 2012-01-08 MED ORDER — CARBAMAZEPINE 100 MG PO CHEW
200.0000 mg | CHEWABLE_TABLET | ORAL | Status: AC
  Filled 2012-01-08: qty 2

## 2012-01-08 MED ORDER — ONDANSETRON HCL 4 MG/2ML IJ SOLN: 4.0000 mg | Freq: Four times a day (QID) | INTRAMUSCULAR | Status: DC | PRN

## 2012-01-08 MED ORDER — SODIUM CHLORIDE 0.9 % IV SOLN
INTRAVENOUS | Status: DC
  Administered 2012-01-09 (×2): via INTRAVENOUS

## 2012-01-08 MED ORDER — ACETAMINOPHEN 325 MG PO TABS: 650.0000 mg | ORAL_TABLET | Freq: Four times a day (QID) | ORAL | Status: DC | PRN

## 2012-01-08 MED ORDER — LEVETIRACETAM 750 MG PO TABS
1500.0000 mg | ORAL_TABLET | Freq: Two times a day (BID) | ORAL | Status: DC
  Administered 2012-01-09 – 2012-01-10 (×3): 1500 mg via ORAL
  Filled 2012-01-08 (×5): qty 2

## 2012-01-08 MED ORDER — DIAZEPAM 2 MG PO TABS: 2.0000 mg | ORAL_TABLET | ORAL | Status: DC | PRN

## 2012-01-08 MED ORDER — POTASSIUM PHOSPHATE MONOBASIC 500 MG PO TABS
500.0000 mg | ORAL_TABLET | ORAL | Status: AC
  Filled 2012-01-08: qty 1

## 2012-01-08 MED ORDER — LACOSAMIDE 10 MG/ML PO SOLN: 5.0000 mg | Freq: Three times a day (TID) | ORAL | Status: DC

## 2012-01-08 MED ORDER — CARBAMAZEPINE 100 MG PO CHEW
200.0000 mg | CHEWABLE_TABLET | Freq: Three times a day (TID) | ORAL | Status: DC
  Filled 2012-01-08 (×4): qty 2

## 2012-01-08 MED ORDER — DIAZEPAM 5 MG/ML IJ SOLN: 10.0000 mg | Freq: Every day | INTRAMUSCULAR | Status: DC | PRN

## 2012-01-08 MED ORDER — DIAZEPAM 10 MG/2ML IM DEVI: 10.0000 mg | Freq: Every day | INTRAMUSCULAR | Status: DC | PRN

## 2012-01-08 MED ORDER — LEVOTHYROXINE SODIUM 50 MCG PO TABS
50.0000 ug | ORAL_TABLET | Freq: Every day | ORAL | Status: DC
  Administered 2012-01-09 – 2012-01-10 (×2): 50 ug via ORAL
  Filled 2012-01-08 (×3): qty 1

## 2012-01-08 MED ORDER — WARFARIN - PHARMACIST DOSING INPATIENT: Freq: Once | Status: AC

## 2012-01-08 NOTE — ED Notes (Signed)
Per family member, pt has seizure disorder, had 8-9 seizures today also having many small seizures, has port accessed and was given meds pta for his seizures, but no relief.

## 2012-01-08 NOTE — ED Notes (Signed)
[id number] Ready 

## 2012-01-08 NOTE — ED Notes (Signed)
Patient reported to have seizures this AM by family. Seizures began at 3 AM and were a combination of Grand Mal seizures and Sensory seizures. Grand Mal Seizure lasted about 3 minutes then sensory seizures began. Family gave patient 2 mg diazepam PO. But seizures continued and at 0500 he was given 2 mg Lorazepam IV. At 0930 he was given 65 mg phenobarbital IV for another [employer] Seizure. Seizures continued and patient brought to [facility] ED.

## 2012-01-08 NOTE — ED Provider Notes (Signed)
History     CSN: [id number]  Arrival date & time 01/08/12  1422   First MD Initiated Contact with Patient 01/08/12 1506      Chief Complaint  Patient presents with  . Seizures    Patient is a 26 y.o. male presenting with seizures. The history is provided by the patient and a relative. No language interpreter was used.  Seizures  This is a chronic problem. The current episode started 12 to 24 hours ago. The problem has not changed since onset.There were 6 to 10 seizures. The most recent episode lasted less than 30 seconds. Pertinent negatives include no sleepiness, no confusion, no headaches, no speech difficulty, no visual disturbance, no neck stiffness, no sore throat, no chest pain, no cough, no nausea, no vomiting, no diarrhea and no muscle weakness. Characteristics include rhythmic jerking and loss of consciousness. Characteristics do not include eye blinking, eye deviation, bowel incontinence, bladder incontinence, bit tongue, apnea or cyanosis. The episode was witnessed. There was no sensation of an aura present. The seizures did not continue in the ED. The seizure(s) had no focality (First seizures today were "sensory" seizures with R arm focality). Possible causes do not include med or dosage change, sleep deprivation, missed seizure meds, recent illness or change in alcohol use. There has been no fever. Medications administered prior to arrival include lorazepam IV (All home meds).    Past Medical History  Diagnosis Date  . ALLERGIC RHINITIS 12/26/2009  . MITRAL VALVE PROLAPSE 06/22/2007  . SEIZURE DISORDER 06/22/2007  . Unspecified hypothyroidism 06/22/2007  . Growth disorder   . Mental retardation, mild (I.Q. 50-70)   . Brown's syndrome   . Irregular heart beat     Past Surgical History  Procedure Date  . Implantation vagal nerve stimulator   . Portacath placement     History reviewed. No pertinent family history.  History  Substance Use Topics  . Smoking status: Never  Smoker   . Smokeless tobacco: Not on file  . Alcohol Use: No      Review of Systems  Constitutional: Negative for fever and chills.  HENT: Negative for sore throat, neck pain and neck stiffness.   Eyes: Negative for visual disturbance.  Respiratory: Negative for apnea, cough, chest tightness, shortness of breath, wheezing and stridor.   Cardiovascular: Negative for chest pain, palpitations, leg swelling and cyanosis.  Gastrointestinal: Negative for nausea, vomiting, abdominal pain, diarrhea, constipation, blood in stool, abdominal distention and bowel incontinence.  Genitourinary: Negative for bladder incontinence, dysuria, urgency, hematuria and difficulty urinating.  Musculoskeletal: Negative for back pain and gait problem.  Skin: Negative for rash.  Neurological: Positive for seizures and loss of consciousness. Negative for dizziness, tremors, syncope, facial asymmetry, speech difficulty, weakness, light-headedness, numbness and headaches.  Hematological: Negative for adenopathy. Does not bruise/bleed easily.  Psychiatric/Behavioral: Negative for confusion.    Allergies  Divalproex sodium and Phenytoin  Home Medications   Current Outpatient Rx  Name Route Sig Dispense Refill  . CARBAMAZEPINE 100 MG PO CHEW Oral Chew 200 mg by mouth 3 (three) times daily. 8am 1:30p and 9pm    . CETIRIZINE HCL 10 MG PO TABS Oral Take 10 mg by mouth every morning.    Marland Kitchen DIAZEPAM 2 MG PO TABS Oral Take 2 mg by mouth as needed. Take as needed when having sensation seizure.     Marland Kitchen DIAZEPAM 10 MG/2ML IM DEVI Intramuscular Inject 10 mg into the muscle daily as needed. For seizures    . LACOSAMIDE  10 MG/ML PO SOLN Oral Take 5 mg by mouth 3 (three) times daily.     Marland Kitchen LEVETIRACETAM 500 MG PO TABS Oral Take 1,500 mg by mouth 2 (two) times daily.     Marland Kitchen LEVOTHYROXINE SODIUM 50 MCG PO TABS Oral Take 50 mcg by mouth daily.      Marland Kitchen LORAZEPAM 2 MG/ML IJ SOLN Intravenous Inject 2 mg into the vein every 8 (eight)  hours as needed. For seizure/ anxiety    . PHENOBARBITAL SODIUM 60 MG/ML IJ SOLN Intravenous Inject 65 mg into the vein daily as needed. For seizures    . POTASSIUM CHLORIDE CRYS ER 20 MEQ PO TBCR Oral Take 20 mEq by mouth 4 (four) times daily. Take at 7a,130p,5p,and 9p    . POTASSIUM PHOSPHATE MONOBASIC 500 MG PO TABS Oral Take 500 mg by mouth 4 (four) times daily. Take at 7a, 130p, 5p, and 9p    . TOPIRAMATE 100 MG PO TABS Oral Take 150 mg by mouth 3 (three) times daily. Take at 7a, 130p, and 9p    . WARFARIN SODIUM 5 MG PO TABS Oral Take 5-7.5 mg by mouth daily. Takes 5mg  tue thru sun and 7.5 mg on mon      BP 110/74  Pulse 113  Temp(Src) 98 F (36.7 C) (Oral)  Resp 18  SpO2 100%  Physical Exam  Constitutional: He is oriented to person, place, and time. He appears well-developed and well-nourished. No distress.  HENT:  Head: Normocephalic and atraumatic.  Eyes: Conjunctivae are normal. Right eye exhibits no discharge. Left eye exhibits no discharge. No scleral icterus.  Neck: Normal range of motion. Neck supple.  Cardiovascular: Normal rate, regular rhythm and intact distal pulses.   Murmur heard. Pulmonary/Chest: Effort normal and breath sounds normal. No stridor. No respiratory distress. He has no wheezes. He has no rales. He exhibits no tenderness.  Abdominal: Soft. Bowel sounds are normal. He exhibits no distension and no mass. There is no tenderness. There is no rebound and no guarding.  Musculoskeletal: Normal range of motion. He exhibits no edema and no tenderness.  Neurological: He is alert and oriented to person, place, and time.  Skin: Skin is warm and dry. He is not diaphoretic.  Psychiatric: He has a normal mood and affect.    ED Course  Procedures (including critical care time)  Labs Reviewed  PHENOBARBITAL LEVEL - Abnormal; Notable for the following:    Phenobarbital 4.0 (*)    All other components within normal limits  URINALYSIS, ROUTINE W REFLEX MICROSCOPIC -  Abnormal; Notable for the following:    APPearance HAZY (*)    Glucose, UA 100 (*)    All other components within normal limits  CARBAMAZEPINE LEVEL, TOTAL  TOPIRAMATE LEVEL  URINE CULTURE   Dg Chest Portable 1 View  01/08/2012  *RADIOLOGY REPORT*  Clinical Data: Seizures.  Pacemaker.  PORTABLE CHEST - 1 VIEW  Comparison: None available.  Findings: A right IJ [name] and left-sided neural stimulator are stable.  Heart size is normal.  The lungs are clear.  The visualized soft tissues and bony thorax are unremarkable.  IMPRESSION: No acute cardiopulmonary disease or significant interval change.  Original Report Authenticated By: Jamesetta Orleans. MATTERN, M.D.   1. Seizure disorder, grand mal     Date: 01/08/2012  Rate: 109  Rhythm: sinus tachycardia  QRS Axis: normal  Intervals: normal  ST/T Wave abnormalities: normal  Conduction Disutrbances:none  Narrative Interpretation:   Old EKG Reviewed: T waves more  prominent in all leads.    MDM  Pt is a well appearing 26yo M with a long standing seizure d/o (both focal and generalized tonic-clonic) brought by mother after pt had a reported 8-9 seizures today. Pt has not missed any meds, denies sleep deprivation, stress or s/s of infection. VSS but mildly tachy. Pt back to baseline presently with no seizures since arrival. I discussed plan given by Dr. Sharene Skeans to check drug levels now, have pt take home dose tegretol and topamax and give IV keppra or vimpat if pt has another seizure. CXR and UA ordered. Will reassess.   CXR and UA unremarkable. Parent reports pt did later have another sensory seizure as well as a tonic-clonic seizure despite home meds and IV keppra given. At parents request pt was admitted to neurohospitalist for observation. Dr. Sharene Skeans was notified of this. No requests for further tx at this time. Admitted in stable condition.       Consuello Masse, MD 01/08/12 [phone]

## 2012-01-08 NOTE — ED Notes (Signed)
NO SEIZURE AT THIS TIME , RESPIRATIONS UNLABORED , IV SITE UNREMARKABLE , FAMILY AT BEDSIDE.

## 2012-01-08 NOTE — Consult Note (Signed)
History of Present Illness  Referral Source:  Eleonore Chiquito, M.D.  History from:  parents and old chart  Reason for visit: urgent follow up visit and interrogation of the MS  Chief Complaint:  recurrent generalized seizures with apnea.    HPI: [name] is a 26 year old male with intractable simple partial somatosensory seizures, complex partial seizures, and secondarily generalized seizures.  He has generally poor seizure control despite being on 4 antiepileptic drugs, and the ulnar stimulator.  During the RESCUE trial he was possible to stop his seizures, and he did not have significant problems with apnea or prolonged seizures.  We are still trying to use that treatment, but he has experienced 2 or 3 episodes of apnea but required not only oxygen, but some rescue breathing.  His most recent event occurred in December 08, 2011.  He had a generalized tonic-clonic seizure with 2 minutes of apnea and cyanosis.  He awakened and began to slowly improve her baseline as the ambulance came to take him to the hospital.  2 months ago he had a similar episode with a prolonged episode Avandia for 3 minutes and received bystander CPR.  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  He has been evaluated for mitochondrial disease and workup was negative.  I have told the family members that I am concerned about the possibility of an unexplained death in an epileptic patient, SUDEP.  We evaluated his vagal nerve stimulator.  The device was interrogated and showed a stimulus amplitude of 2.25 mA, frequency of stimulation: 20 Hz, pulse width: 250 s, stimulus time on: 7 seconds, stimulus time off:  30 seconds, magnet amplitude: 2.50 mA, magnet stimulus time on: 60 seconds, magnet pulse width 250 s.  Device was reprogrammed to assess program, telemetry, impedance, and battery life.  The system diagnostics These were okay at 1.0 milliamps . The DC/ DC conversion was 0.  The normal no diagnostics at 2.25 milliamps showed a DC/ DC conversion code of 3.  There is no indication to change the battery. A total of 1108 stimuli have taken place since implantation December 09, 2005.  8 since his last reprogramming December 27, 2010.  The patient tolerated the procedure well. He had a cough indicating that the device is working well.    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  He remains on Coumadin in therapeutic doses.  Review of Systems  Out of a complete 14 system review, the patient complains of only the following symptoms, and all other reviewed systems are negative.  Neurological: Seizure     Social History [name] lives with his parents.  He also spends time with his sister, [name]. There is a separate worker who provides daycare to him through the [employer] program.  His mother has had breast cancer with chemotherapy.  His mother is showing some signs of cognitive decline.  His father also has significant medical problems related to reactive airways, and I think hypertension.  They have provided unbelievably excellent care over the past 16 years.  [name] drinks tea daily.  He has never used tobacco, alcohol, or drugs.He has a certificate of completion of  high school Inhaled Tobacco Use: never smoker  Family History Mother has breast cancer; grandparents on both sides have heart disease, and diabetes; paternal grandfather has emphysema; all immediate family members have migraine; mother suffers from memory loss. Mother has hypertension,  dyslipidemia, and diabetes. His grandfather died of lung cancer recently. His aunt has recently  died. There is no family history of seizures, mental retardation, blindness, deafness, birth defects, autism, or chromosomal disorders.  Past Medical History Diagnosis of developmental delay as a toddler.  He was a breech presentation, cesarean section delivery.  He  had intrauterine growth retardation, and failure to thrive.  EEG 11/07/86 was normal for gestational age.  Genetics evaluation short stature, prenatal onset, chromosomal study: 72 XY, fragile X syndrome negative, evaluation for MELAS and MERRF were negative.  Initial seizure 08/28/94 left motor with secondary generalization MRI of the brain 09/13/93: Scattered subcortical white matter lesions at the parietotemporal parieto-occipital junction: ischemia versus hamartomas. 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  Tegretol 30 mcg/mL.  Neurontin discontinued, Lamictal started MRI brain 07/10/97: small sellar turcica, hypoplasia of the anterior pituitary and infundibulum 5 mm focus of increased signal intensity white matter adjacent to the right lateral ventricle 10/16/97 Tegretol plus Felbatol Protropin discontinued 10/13/97 frequency of seizures.from 4 per day down to one every other day and then 1-2 per week. Topiramate started in May 1999 unable to be tolerated with Tegretol  and was tapered and discontinued January 2000 EEG showed right greater than left mid temporal sharp waves was otherwise well-organized EEG.  He hospitalizations in March, July, October, in November for recurrent seizures. Patient placed on Depakote in April 2000 and  developed gagging, abdominal pain, headache and had significant change in transaminases and liver functions.  Topamax is restarted and  Keppra was added.  More recently Vimpat was added but he could not tolerate it at high doses.  Surgical History [name] insertions, and vagal nerve stimulator, 2 eye surgeries for Brown's  syndrome  Vitals  Height: 62.5 inches Weight: 86 pounds  39.55 kilograms BMI: 15.72 BP: 86/ 66 in the left arm  Heart Rate: 96  Previous Weight: 92.2 (09/29/2011) Previous Height: 62.5 (09/29/2011)   Vision Screening  Unable to test vision at this visit.    Physical Exam  General: thin young man, seated on exam table, in no evident distress, right-handed, brown hair, brown eyes Head: head  microcephalic and atraumatic.  Ears, Nose and Throat: beak-like nose. He has a low lying palate.  Neck: supple with no carotid or supraclavicular bruits. He complains of soreness with palpation of the cervical lymph nodes Respiratory: Lungs are clear to auscultation Cardiovascular: regular rate and rhythm, holosystolic murmur at left sternal border.   His nailbeds are pink. Musculoskeletal: short stature with head tilted to the right. He has tight heel cords. Skin: no rashes or lesions. Trunk: no scoliosis. He has portacath in the right upper chest.  Neurologic Exam  Mental Status: Awake and fully alert.  Oriented to place and time. Attention span, concentration, and fund of knowledge appropriate for him. He has evidence of mild mental retardation.  Mood and affect appropriate. He is tired but interactive with the examiner and his family. He  has dysarthria but is intelligible Cranial Nerves: Fundoscopic exam reveals sharp disc margins with  bilateral optic nerve hypoplasia.  Pupils equal, briskly reactive to light.  Extraocular movements full but dysconjugate.  Visual fields full to confrontation.  Hearing intact and symmetric to whisper.  Facial sensation intact.  Face, tongue, palate  move normally and symmetrically.  Neck flexion and extension normal.  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 Motor: Diminished bulk and normal tone.  The patient has mild weakness in his deltoids and biceps in the 4+ over 5 range.  He is clumsy fine motor movements. Sensory: Intact to touch and temperature in all extremities Coordination: Rapid alternating movements clumsy in all extremities.  Finger-to-nose and heel-to-shin performed accurately bilaterally. Romberg negative. Gait and Station: Arises from chair without difficulty.  Stance is wide based.  Gait demonstrates ataxic stride length and fair balance.  Able to heel, toe, and tandem walk with difficulty. Reflexes: Absent and symmetric.  Toes downgoing.  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  Ominously, his recent seizures have been associated with prolonged periods anatomy a requiring supplemental oxygen and rescue breathing.  His sister suggested that we consider slightly dropping his carbamazepine so we might be able to increase Vimpat.  His family generally feels that Vimpat has been more effective than any of his other medications.  I'm certainly willing to give  that a try.  No other changes will be made.  Plan  Medication List:  WARFARIN SODIUM 5 MG TABS (WARFARIN SODIUM) 1 po daily at 5 PM until further notice ZYRTEC ALLERGY 10 MG TABS (CETIRIZINE HCL) 1 po q d. KEPPRA 500 MG TABS (LEVETIRACETAM) 3 po BID TOPAMAX 100 MG TABS (TOPIRAMATE) 1 1/2  at 7 AM, 1:30 PM, 9 PM [BMN] K-PHOS 500 MG TABS (POTASSIUM PHOSPHATE MONOBASIC) 1 po  at 7 AM,  1:30 PM, 5 PM, and 9 PM DIAZEPAM 2 MG TABS (DIAZEPAM) 1 po prn PHENOBARBITAL SODIUM SOLN (PHENOBARBITAL SODIUM SOLN) 65mg  administered IV via port-a-cath PRN seizures CARBAMAZEPINE 100 MG CHEW (CARBAMAZEPINE) 2  tabs TID , AT 7:00 AM, 1:30 PM , AND 9:00 PM KLOR-CON 20 MEQ PACK (POTASSIUM CHLORIDE) one p.o. at 7 AM, 1:30 PM, 5 PM, 9 PM SYNTHROID 50 MCG TABS (LEVOTHYROXINE SODIUM) one p.o. q.d. ATIVAN 2 MG/ML SOLN (LORAZEPAM) administered for recurrent cluster seizures with phenobarbital * RESCUE TRIAL PARTICIPANT Administer autoinjector for clusters of seizures per protocol VIMPAT 10 MG/ML ORAL SOLN (LACOSAMIDE) 5 cc p.o. q AM, 5 cc po q midday and 5 cc  q HS [BMN] Allergies   Done  DEPAKOTE (Critical) DILANTIN (Critical) VANCOMYCIN (Critical)

## 2012-01-08 NOTE — ED Notes (Addendum)
Dr. March Rummage at bedside, witnessed seizure. Pt stiffened up, sat straight up in bed. Was not full body tonic-clonic. Seizure lasted approx 30 seconds. No post ictal phase afterwards, A&OX4.  IV keppra infusing

## 2012-01-08 NOTE — H&P (Signed)
Admission H&P    Chief Complaint: Recurrent partial and generalized seizures with significant increase in seizure frequency.  HPI: [name] is an 26 y.o. 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 Patient has had 15-20 sensory seizures involving his left upper extremity as well as 10-12 generalized seizures. He takes Tegretol 200 mg 3 times a day, Keppra 1500 mg twice a day, Topamax 150 mg 3 times a day and the Vimpat 50 mg 3 times a day for routine maintenance, and has a vagal nerve stimulator. He was given intramuscular Valium 10 mg at home. His mother thinks that most of the medication went on to the bed. He was given an additional 2 mg of Valium by mouth. He was also given Ativan 2 mg IV by way of his [name], and also phenobarbital 65 mg per [name]. He was subsequently brought to the emergency room for further evaluation. He continued to have sensory seizures in the emergency room as well as multiple witnessed generalized seizures. He was given 500 mg of Keppra. was completed approximately one hour prior to my evaluation. No further seizure activity was noted. Patient is being admitted for overnight observation because of the increase in seizure frequency and somewhat refractory nature of his current seizure flurry.  Past Medical History  Diagnosis Date  . ALLERGIC RHINITIS 12/26/2009  . MITRAL VALVE PROLAPSE 06/22/2007  . SEIZURE DISORDER 06/22/2007  . Unspecified hypothyroidism 06/22/2007  . Growth disorder   . Mental retardation, mild (I.Q. 50-70)   . Brown's syndrome   . Irregular heart beat     Past Surgical History  Procedure Date  . Implantation vagal nerve stimulator   . Portacath placement     History reviewed. No pertinent family history. Social History:  reports that he has never  smoked. He does not have any smokeless tobacco history on file. He reports that he does not drink alcohol. His drug history not on file.  Allergies:  Allergies  Allergen Reactions  . Divalproex Sodium Other (See Comments)    Enhances seizures  . Phenytoin Other (See Comments)    Slows the system    Medications Prior to Admission  Medication Dose Route Frequency Provider Last Rate Last Dose  . carbamazepine (TEGRETOL) chewable tablet 200 mg  200 mg Oral TID [name]      . diazepam (VALIUM) tablet 2 mg  2 mg Oral PRN [name]      . Diazepam DEVI 10 mg  10 mg Intramuscular Daily PRN [name]      . Lacosamide SOLN 5 mg  5 mg Oral TID [name]      . levETIRAcetam (KEPPRA) 500 mg in sodium chloride 0.9 % 100 mL IVPB  500 mg Intravenous To Minor Consuello Masse, MD   500 mg at 01/08/12 1830  . levETIRAcetam (KEPPRA) tablet 1,500 mg  1,500 mg Oral BID [name]      . levothyroxine (SYNTHROID, LEVOTHROID) tablet 50 mcg  50 mcg Oral Daily [name]      . loratadine (CLARITIN) tablet 10 mg  10 mg Oral Daily [name]      . LORazepam (ATIVAN) injection 2 mg  2 mg Intravenous Q8H PRN [name]      . potassium chloride SA (K-DUR,KLOR-CON) CR tablet 20 mEq  20 mEq  Oral QID [name]      . potassium phosphate (monobasic) (K-PHOS ORIGINAL) tablet 500 mg  500 mg Oral QID [name]      . topiramate (TOPAMAX) tablet 150 mg  150 mg Oral TID [name]      . DISCONTD: 0.9 %  sodium chloride infusion   Intravenous Once Celene Kras, MD       Medications Prior to Admission  Medication Sig Dispense Refill  . carbamazepine (TEGRETOL) 100 MG chewable tablet Chew 200 mg by mouth 3 (three) times daily. 8am 1:30p and 9pm      . cetirizine (ZYRTEC) 10 MG tablet Take 10 mg by mouth every morning.      . diazepam (VALIUM) 2 MG tablet Take 2 mg by mouth as needed. Take as needed when having sensation seizure.       . Diazepam 10 MG/2ML DEVI  Inject 10 mg into the muscle daily as needed. For seizures      . Lacosamide (VIMPAT) 10 MG/ML SOLN Take 5 mg by mouth 3 (three) times daily.       Marland Kitchen levETIRAcetam (KEPPRA) 500 MG tablet Take 1,500 mg by mouth 2 (two) times daily.       Marland Kitchen levothyroxine (SYNTHROID, LEVOTHROID) 50 MCG tablet Take 50 mcg by mouth daily.        Marland Kitchen LORazepam (ATIVAN) 2 MG/ML injection Inject 2 mg into the vein every 8 (eight) hours as needed. For seizure/ anxiety      . phenobarbital (LUMINAL) 60 MG/ML injection Inject 65 mg into the vein daily as needed. For seizures      . potassium chloride SA (K-DUR,KLOR-CON) 20 MEQ tablet Take 20 mEq by mouth 4 (four) times daily. Take at 7a,130p,5p,and 9p      . potassium phosphate, monobasic, (K-PHOS) 500 MG tablet Take 500 mg by mouth 4 (four) times daily. Take at 7a, 130p, 5p, and 9p      . topiramate (TOPAMAX) 100 MG tablet Take 150 mg by mouth 3 (three) times daily. Take at 7a, 130p, and 9p      . warfarin (COUMADIN) 5 MG tablet Take 5-7.5 mg by mouth daily. Takes 5mg  tue thru sun and 7.5 mg on mon        ROS: History obtained from both parents General ROS: negative Psychological ROS: negative Ophthalmic ROS: negative ENT ROS: negative Allergy and Immunology ROS: negative Hematological and Lymphatic ROS: Therapeutic on Coumadin Endocrine ROS: negative Respiratory ROS: no cough, shortness of breath, or wheezing Cardiovascular ROS: no chest pain or dyspnea on exertion Gastrointestinal ROS: no abdominal pain, change in bowel habits, or black or bloody stools Genito-Urinary ROS: no dysuria, trouble voiding, or hematuria Musculoskeletal ROS: Distal extremity weakness and wasting with equinovarus contractures at the ankles, unchanged.  Dermatological ROS: negative   Physical Examination: Blood pressure 104/65, pulse 98, temperature 98 F (36.7 C), temperature source Oral, resp. rate 18, SpO2 100.00%.  HEENT-  Normocephalic, no lesions, without obvious abnormality.   Normal external eye and conjunctiva.  Normal TM's bilaterally.  Normal auditory canals and external ears. Normal external nose, mucus membranes and septum.  Normal pharynx. Neck supple with no masses, nodes, nodules or enlargement. Cardiovascular - regular rate and rhythm and systolic murmur: systolic ejection 2/6, crescendo at 2nd left intercostal space Lungs - chest clear, no wheezing, rales, normal symmetric air entry, Heart exam - S1, S2 normal, no murmur, no gallop, rate regular Abdomen - soft, non-tender; bowel sounds normal; no masses,  no organomegaly Extremities - distal weakness and wasting of the brain lower extremities with equinovarus deformities of the ankles.   Neurologic Examination: Mental Status: Alert, no distress.  Speech slightly dysarthric without evidence of aphasia. Able to follow simple commands without difficulty. Cranial Nerves: II-Visual fields were normal. [name]/IV/VI-Pupils were equal and reacted. Extraocular movements were full and conjugate.    V/VII- no facial weakness. VIII-normal. X-slightly dysarthric speech. XII-midline tongue extension Motor: 4/5 wrist extensors bilaterally with mild intrinsic hand muscle weakness as well; 4+ over 5 dorsiflexors of the feet with mild to moderate equinovarus deformities. Sensory: Normal throughout. Deep Tendon Reflexes: Absent throughout Plantars: Mute bilaterally Cerebellar: Normal finger-to-nose testing.  Results for orders placed during the hospital encounter of 01/08/12 (from the past 48 hour(s))  PHENOBARBITAL LEVEL     Status: Abnormal   Collection Time   01/08/12  3:39 PM      Component Value Range Comment   Phenobarbital 4.0 (*) 15.0 - 40.0 (ug/mL)   CARBAMAZEPINE LEVEL, TOTAL     Status: Normal   Collection Time   01/08/12  3:39 PM      Component Value Range Comment   Carbamazepine Lvl 7.9  4.0 - 12.0 (ug/mL)   URINALYSIS, ROUTINE W REFLEX MICROSCOPIC     Status: Abnormal   Collection Time   01/08/12  4:43  PM      Component Value Range Comment   Color, Urine YELLOW  YELLOW     APPearance HAZY (*) CLEAR     Specific Gravity, Urine 1.009  1.005 - 1.030     pH 7.5  5.0 - 8.0     Glucose, UA 100 (*) NEGATIVE (mg/dL)    Hgb urine dipstick NEGATIVE  NEGATIVE     Bilirubin Urine NEGATIVE  NEGATIVE     Ketones, ur NEGATIVE  NEGATIVE (mg/dL)    Protein, ur NEGATIVE  NEGATIVE (mg/dL)    Urobilinogen, UA 0.2  0.0 - 1.0 (mg/dL)    Nitrite NEGATIVE  NEGATIVE     Leukocytes, UA NEGATIVE  NEGATIVE  MICROSCOPIC NOT DONE ON URINES WITH NEGATIVE PROTEIN, BLOOD, LEUKOCYTES, NITRITE, OR GLUCOSE <1000 mg/dL.   Dg Chest Portable 1 View  01/08/2012  *RADIOLOGY REPORT*  Clinical Data: Seizures.  Pacemaker.  PORTABLE CHEST - 1 VIEW  Comparison: None available.  Findings: A right IJ [name] and left-sided neural stimulator are stable.  Heart size is normal.  The lungs are clear.  The visualized soft tissues and bony thorax are unremarkable.  IMPRESSION: No acute cardiopulmonary disease or significant interval change.  Original Report Authenticated By: Jamesetta Orleans. MATTERN, M.D.    Assessment/Plan  Marked increase in seizure frequency, both focal and generalized seizures with recurrent seizure activity despite usual treatment measures with parenteral Ativan, diazepam and phenobarbital.  Plan: 1. Admission and observation status because of the increase in seizure frequency and lack of responsiveness to earlier treatment measures. 2. Will discuss with the parents the potential for increasing Tegretol to 250 mg 3 times a day, or as an alternative possibly increasing Vimpat to 75 mg 3 times a day. 3. Consideration for investigating patency of [name] as patient did not seem to be responsive to Ativan and phenobarbital injections given by the family prior to coming to the hospital. 4. Consideration for Doppler evaluation of left upper extremity for resolution of DVT.  C.R. Roseanne Reno, MD [facility] [phone]   01/08/2012, 8:47 PM

## 2012-01-08 NOTE — ED Notes (Signed)
IV team at bedside to assess porta cath which was accessed by mother PTA

## 2012-01-08 NOTE — ED Notes (Signed)
Informed patient and/or family of status. No voiced complaints presently. NAD. Watching tv. Pt took own home meds per ED MD.

## 2012-01-08 NOTE — Progress Notes (Addendum)
ANTICOAGULATION CONSULT NOTE - Initial Consult  Pharmacy Consult for coumadin Indication: hx of clot in arm  Allergies  Allergen Reactions  . Divalproex Sodium Other (See Comments)    Enhances seizures  . Phenytoin Other (See Comments)    Slows the system    Patient Measurements:   Heparin Dosing Weight:   Vital Signs: Temp: 98 F (36.7 C) (04/18 1428) Temp src: Oral (04/18 1428) BP: 104/65 mmHg (04/18 1830) Pulse Rate: 98  (04/18 1830)  Labs: No results found for this basename: HGB:2,HCT:3,PLT:3,APTT:3,LABPROT:3,INR:3,HEPARINUNFRC:3,CREATININE:3,CKTOTAL:3,CKMB:3,TROPONINI:3 in the last 72 hours The CrCl is unknown because both a height and weight (above a minimum accepted value) are required for this calculation.  Medical History: Past Medical History  Diagnosis Date  . ALLERGIC RHINITIS 12/26/2009  . MITRAL VALVE PROLAPSE 06/22/2007  . SEIZURE DISORDER 06/22/2007  . Unspecified hypothyroidism 06/22/2007  . Growth disorder   . Mental retardation, mild (I.Q. 50-70)   . Brown's syndrome   . Irregular heart beat     Medications:  Scheduled:    . carbamazepine  200 mg Oral TID  . Lacosamide  5 mg Oral TID  . levetiracetam  500 mg Intravenous To Minor  . levETIRAcetam  1,500 mg Oral BID  . levothyroxine  50 mcg Oral Daily  . loratadine  10 mg Oral Daily  . potassium chloride SA  20 mEq Oral QID  . potassium phosphate (monobasic)  500 mg Oral QID  . topiramate  150 mg Oral TID  . DISCONTD: sodium chloride   Intravenous Once   Infusions:    Assessment: 26 yo male with hx of clot in arm will be continued on coumadin therapy.  Home coumadin dose was 5mg  po every day on Tuesdays to Sundays and 7.5mg  on Mondays.  Per Dad, last dose was on 01/08/12.  INR 1.64 today. Goal of Therapy:  INR 2-3   Plan:  1) Follow INR in am to give him dose for 01/09/12 2) Daily PT/INR  Brendyn Mclaren, Tsz-Yin 01/08/2012,8:47 PM

## 2012-01-08 NOTE — ED Notes (Addendum)
Family reports multiple seizures today. Grand mal 3 minutes this am &  > 16 grand mal seizures lasting 30 seconds or less. States has been adjusting meds past 3 weeks, decreasing tegretol, increasing vimpat.

## 2012-01-09 ENCOUNTER — Inpatient Hospital Stay (HOSPITAL_COMMUNITY): Payer: Medicaid Other

## 2012-01-09 DIAGNOSIS — Z86718 Personal history of other venous thrombosis and embolism: Secondary | ICD-10-CM

## 2012-01-09 LAB — PROTIME-INR: INR: 1.72 — ABNORMAL HIGH (ref 0.00–1.49)

## 2012-01-09 MED ORDER — CARBAMAZEPINE 100 MG PO CHEW
250.0000 mg | CHEWABLE_TABLET | Freq: Three times a day (TID) | ORAL | Status: DC
  Administered 2012-01-09 – 2012-01-10 (×5): 250 mg via ORAL
  Filled 2012-01-09 (×7): qty 2.5

## 2012-01-09 MED ORDER — CARBAMAZEPINE 100 MG PO CHEW: 50.0000 mg | CHEWABLE_TABLET | Freq: Three times a day (TID) | ORAL | Status: DC

## 2012-01-09 MED ORDER — IOHEXOL 300 MG/ML  SOLN
100.0000 mL | Freq: Once | INTRAMUSCULAR | Status: AC | PRN
  Administered 2012-01-09: 50 mL via INTRAVENOUS

## 2012-01-09 MED ORDER — WARFARIN SODIUM 7.5 MG PO TABS
7.5000 mg | ORAL_TABLET | Freq: Once | ORAL | Status: AC
  Administered 2012-01-09: 7.5 mg via ORAL
  Filled 2012-01-09 (×3): qty 1

## 2012-01-09 NOTE — Progress Notes (Signed)
Patient ID: [name], male   DOB: 1986/06/03, 26 y.o.   MRN: [id number] Summary of the day.  The patient had 8 somatosensory seizures and was treated with 2 mg of Ativan.  There have been no further seizures.  His [name] empties into a series of very small collateral channels into the mediastinum.  It appears that it is no longer appropriate to use this as a means for infusing medication.  I spoke with the interventional radiologist to use interested and willing to remove the [name] in place another one.  In order to do this, we will have to reverse his Coumadin, place him on heparin, remove the heparin in order to do the procedures, restart heparin or Lovenox simultaneously with Coumadin.  He also has persistent clot in his left subclavian on his venous Doppler.  I spoke with Dr. Thana Farr.  We agree that the patient should remain in the hospital overnight.  I will see the family in the morning to discuss these issues.  I think that we need to concentrate on getting his seizures under better control before we deal with the issue of removing the [name].  I also would like to speak with one of the vascular surgeons like Woodfin Ganja before proceeding so that we can anticipate any problems that may arise.

## 2012-01-09 NOTE — Progress Notes (Signed)
Patient ID: [name], male   DOB: 1986-05-13, 26 y.o.   MRN: [id number] [facility] Progress Note  Patient name: [name] Medical record number: [id number] Date of birth: August 02, 1986 Age: 26 y.o. Gender: male    LOS: 1 day   Primary Care Provider: [name], MD, MD  Overnight Events: The patient has been seizure free all night.  He is near his neurologic baseline at this time.  He is tolerating CPAP for obstructive apnea and slept well last night.  His carbamazepine level is about 2 mcg/mL lower than it was before I dropped the dose.  We're going to try to increase his carbamazepine and hope that he does not have significant side effects from that.  It appears that the port which is been in place for 13 years in his right subclavian is blocked.  I contacted radiology and we will order a port study for patency.  I'm afraid after all these years, but nothing will be possible to open it up.  We will also evaluate his left subclavian vein for persistent clot.  He needs to be n.p.o. for the port study and I have ordered that.  There no other complaints at this time.  Objective: Vital signs in last 24 hours: Temp:  [97.3 F (36.3 C)-98.2 F (36.8 C)] 97.3 F (36.3 C) (04/19 0500) Pulse Rate:  [71-113] 71  (04/19 0500) Resp:  [18-25] 20  (04/19 0500) BP: (84-110)/(46-74) 84/46 mmHg (04/19 0500) SpO2:  [98 %-100 %] 98 % (04/19 0500) Weight:  [40.506 kg (89 lb 4.8 oz)] 40.506 kg (89 lb 4.8 oz) (04/18 2300)  Wt Readings from Last 3 Encounters:  01/08/12 40.506 kg (89 lb 4.8 oz)  12/08/11 44.453 kg (98 lb)  08/29/11 43.999 kg (97 lb)     No intake or output data in the 24 hours ending 01/09/12 0741  Current Facility-Administered Medications  Medication Dose Route Frequency Provider Last Rate Last Dose  . 0.9 %  sodium chloride infusion   Intravenous Continuous [name] 50 mL/hr at 01/09/12 0029    . acetaminophen (TYLENOL) tablet 650 mg  650 mg Oral  Q6H PRN [name]       Or  . acetaminophen (TYLENOL) suppository 650 mg  650 mg Rectal Q6H PRN [name]      . carbamazepine (TEGRETOL) chewable tablet 200 mg  200 mg Oral TID [name]      . carbamazepine (TEGRETOL) chewable tablet 200 mg  200 mg Oral To Minor Nelia Shi, MD      . diazepam (VALIUM) injection 10 mg  10 mg Intramuscular Daily PRN [name]      . diazepam (VALIUM) tablet 2 mg  2 mg Oral PRN [name]      . lacosamide (VIMPAT) tablet 50 mg  50 mg Oral TID [name]      . lacosamide (VIMPAT) tablet 50 mg  50 mg Oral To Minor Nelia Shi, MD      . levETIRAcetam (KEPPRA) 500 mg in sodium chloride 0.9 % 100 mL IVPB  500 mg Intravenous To Minor Consuello Masse, MD   500 mg at 01/08/12 1830  . levETIRAcetam (KEPPRA) tablet 1,500 mg  1,500 mg Oral BID [name]      . levETIRAcetam (KEPPRA) tablet 1,500 mg  1,500 mg Oral To Minor Nelia Shi, MD      . levothyroxine (SYNTHROID, LEVOTHROID) tablet 50 mcg  50 mcg Oral Q breakfast [name]      .  loratadine (CLARITIN) tablet 10 mg  10 mg Oral Daily [name]      . LORazepam (ATIVAN) injection 2 mg  2 mg Intravenous Q8H PRN [name]      . ondansetron (ZOFRAN) tablet 4 mg  4 mg Oral Q6H PRN [name]       Or  . ondansetron [facility]) injection 4 mg  4 mg Intravenous Q6H PRN [name]      . potassium chloride SA (K-DUR,KLOR-CON) CR tablet 20 mEq  20 mEq Oral QID [name]      . potassium phosphate (monobasic) (K-PHOS ORIGINAL) tablet 500 mg  500 mg Oral TID AC & HS [name]      . potassium phosphate (monobasic) (K-PHOS ORIGINAL) tablet 500 mg  500 mg Oral To Minor Nelia Shi, MD      . topiramate (TOPAMAX) 150 mg  150 mg Oral TID [name]      . topiramate (TOPAMAX) tablet 150 mg  150 mg Oral To Minor Nelia Shi, MD      . Warfarin - Pharmacist Dosing Inpatient   Does not apply Once Nelia Shi, MD      .  Warfarin - Pharmacist Dosing Inpatient   Does not apply q1800 Nelia Shi, MD      . DISCONTD: 0.9 %  sodium chloride infusion   Intravenous Once Celene Kras, MD      . DISCONTD: Diazepam DEVI 10 mg  10 mg Intramuscular Daily PRN [name]      . DISCONTD: Lacosamide SOLN 5 mg  5 mg Oral TID [name]       PE: AOZ:HYQMV, alert, recognizes me, dysarthria without dysphasia or dyspraxia HEENT:No signs of infection; His neck appears somewhat full to me bilaterally, but mother says he is at baseline. HQ:IONGEX normal, holosystolic murmur at the left sternal border, good capillary refill BMW:UXLKG clear to auscultation MWN:UUVO bowel sounds normal no hepatosplenomegaly Ext/Musc:Decreased muscle mass, no edema or cyanosis Neuro:Mental status as noted above Round reactive pupils, possible reflex, he doesn't cooperate well for funduscopic examination, visual fields are full to double simultaneous stimuli extraocular movements are full but not always conjugate symmetric facial strength, midline tongue and uvula, air conduction greater than bone conduction bilaterally Normal axial strength, clumsy fine motor movements at baseline, no pronator drift Mild peripheral polyneuropathy, good stereoagnosis Gait not tested Arreflexic, Bilateral flexor plantar responses  Labs/Studies: Laboratory studies were reviewed.  The patient has hypokalemia which is normal for him.  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  SignedDeetta Perla, MD Child neurology attending [phone] 01/09/2012 7:41 AM

## 2012-01-09 NOTE — Progress Notes (Signed)
Utilization review completed. Merisa Julio, RN, BSN. 01/09/12 

## 2012-01-09 NOTE — ED Provider Notes (Signed)
I saw and evaluated the patient, reviewed the resident's note and I agree with the findings and plan.   .Face to face Exam:  General:  Awake HEENT:  Atraumatic Resp:  Normal effort Abd:  Nondistended Neuro:No focal weakness Lymph: No adenopathy   Nelia Shi, MD 01/09/12 2050

## 2012-01-09 NOTE — Progress Notes (Signed)
ANTICOAGULATION CONSULT NOTE - Follow Up Consult  Pharmacy Consult for Warfarin Indication: Hx of clot in arm  Allergies  Allergen Reactions  . Divalproex Sodium Other (See Comments)    Nausea, vomiting, liver and kidney dysfunction  . Phenytoin Rash    Hypersensitivity  reaction  . Vancomycin Rash    Redman's Syndrome    Patient Measurements: Height: 5\' 3"  (160 cm) Weight: 89 lb 4.8 oz (40.506 kg) IBW/kg (Calculated) : 56.9   Vital Signs: Temp: 98.4 F (36.9 C) (04/19 1359) Temp src: Oral (04/19 1359) BP: 73/44 mmHg (04/19 1359) Pulse Rate: 90  (04/19 1359)  Labs:  Basename 01/09/12 0540 01/08/12 2048  HGB -- --  HCT -- --  PLT -- --  APTT -- --  LABPROT 20.5* 19.7*  INR 1.72* 1.64*  HEPARINUNFRC -- --  CREATININE -- --  CKTOTAL -- --  CKMB -- --  TROPONINI -- --   Estimated Creatinine Clearance: 81.6 ml/min (by C-G formula based on Cr of 0.71).   Assessment: 26 y.o. M on warfarin for history of clot in arm with a SUBtherapeutic INR this a.m. (INR 1.72 << 1.64, goal of 2-3). No CBC this admission. No bleeding noted at this time. Carbamazepine dose increased which may increase the amount of warfarin required. PTA dose was 5 mg daily EXCEPT for 7.5 mg on Sundays only.   Goal of Therapy:  INR 2-3   Plan:  1. Warfarin 7.5 mg x 1 dose at 1800 today 2. Will continue to monitor for any signs/symptoms of bleeding and will follow up with PT/INR in the a.m.   Georgina Pillion, PharmD, BCPS Clinical Pharmacist Pager: [phone] 01/09/2012 2:23 PM

## 2012-01-09 NOTE — Progress Notes (Signed)
*  PRELIMINARY RESULTS* Vascular Ultrasound Upper extremity venous duplex has been completed.   No evidence of right internal carotid artery or subclavian artery deep vein thrombosis. Left internal jugular vein and subclavian vein appear to have echogenic material within the lumen, which is suggestive of thrombosis. The remaining vessels of the left upper extremity are compressible without evidence of deep vein thrombosis.  Malachy Moan, RDMS, RDCS 01/09/2012, 3:27 PM

## 2012-01-09 NOTE — Progress Notes (Signed)
Pt reports having 8 sensory seizures this am, IV ativan given which resolved siezure activity. Dr. Sharene Skeans made aware

## 2012-01-09 NOTE — Progress Notes (Signed)
Subjective: Patient awake and sitting up in bed.  By report of nursing has had 8 sensory seizures today.  Ativan was given earlier today and has not had any further seizure activity.  Investigation of port-a-cath shows that it will need replacing.  Evidence of thrombosis remains.  Pharmacy following Coumadin.    Objective: Current vital signs: BP 73/44  Pulse 90  Temp(Src) 98.4 F (36.9 C) (Oral)  Resp 20  Ht 5\' 3"  (1.6 m)  Wt 40.506 kg (89 lb 4.8 oz)  BMI 15.82 kg/m2  SpO2 99% Vital signs in last 24 hours: Temp:  [97.3 F (36.3 C)-98.4 F (36.9 C)] 98.4 F (36.9 C) (04/19 1359) Pulse Rate:  [71-98] 90  (04/19 1359) Resp:  [18-20] 20  (04/19 1359) BP: (73-104)/(44-65) 73/44 mmHg (04/19 1359) SpO2:  [98 %-100 %] 99 % (04/19 1359) Weight:  [40.506 kg (89 lb 4.8 oz)] 40.506 kg (89 lb 4.8 oz) (04/18 2300)  Intake/Output from previous day:   Intake/Output this shift:   Nutritional status: General  Neurologic Exam: Mental Status:  Alert, no distress. Speech slightly dysarthric without evidence of aphasia. Able to follow simple commands without difficulty.  Cranial Nerves:  II-Visual fields were normal.  [name]/IV/VI-Pupils were equal and reacted. Extraocular movements were full and conjugate.  V/VII- no facial weakness.  VIII-normal.  X-slightly dysarthric speech.  XII-midline tongue extension  Motor: 4/5 wrist extensors bilaterally with mild intrinsic hand muscle weakness as well; 4+ over 5 dorsiflexors of the feet with mild to moderate equinovarus deformities.  Sensory: Normal throughout.  Deep Tendon Reflexes: Absent throughout  Plantars: Mute bilaterally  Cerebellar: Normal finger-to-nose testing.  Lab Results: Results for orders placed during the hospital encounter of 01/08/12 (from the past 48 hour(s))  PHENOBARBITAL LEVEL     Status: Abnormal   Collection Time   01/08/12  3:39 PM      Component Value Range Comment   Phenobarbital 4.0 (*) 15.0 - 40.0 (ug/mL)     CARBAMAZEPINE LEVEL, TOTAL     Status: Normal   Collection Time   01/08/12  3:39 PM      Component Value Range Comment   Carbamazepine Lvl 7.9  4.0 - 12.0 (ug/mL)   URINALYSIS, ROUTINE W REFLEX MICROSCOPIC     Status: Abnormal   Collection Time   01/08/12  4:43 PM      Component Value Range Comment   Color, Urine YELLOW  YELLOW     APPearance HAZY (*) CLEAR     Specific Gravity, Urine 1.009  1.005 - 1.030     pH 7.5  5.0 - 8.0     Glucose, UA 100 (*) NEGATIVE (mg/dL)    Hgb urine dipstick NEGATIVE  NEGATIVE     Bilirubin Urine NEGATIVE  NEGATIVE     Ketones, ur NEGATIVE  NEGATIVE (mg/dL)    Protein, ur NEGATIVE  NEGATIVE (mg/dL)    Urobilinogen, UA 0.2  0.0 - 1.0 (mg/dL)    Nitrite NEGATIVE  NEGATIVE     Leukocytes, UA NEGATIVE  NEGATIVE  MICROSCOPIC NOT DONE ON URINES WITH NEGATIVE PROTEIN, BLOOD, LEUKOCYTES, NITRITE, OR GLUCOSE <1000 mg/dL.  URINE CULTURE     Status: Normal (Preliminary result)   Collection Time   01/08/12  4:43 PM      Component Value Range Comment   Specimen Description URINE, CLEAN CATCH      Special Requests NONE      Culture  Setup Time [id number]      Colony Count PENDING  Culture Culture reincubated for better growth      Report Status PENDING     PROTIME-INR     Status: Abnormal   Collection Time   01/08/12  8:48 PM      Component Value Range Comment   Prothrombin Time 19.7 (*) 11.6 - 15.2 (seconds)    INR 1.64 (*) 0.00 - 1.49    PROTIME-INR     Status: Abnormal   Collection Time   01/09/12  5:40 AM      Component Value Range Comment   Prothrombin Time 20.5 (*) 11.6 - 15.2 (seconds)    INR 1.72 (*) 0.00 - 1.49      Recent Results (from the past 240 hour(s))  URINE CULTURE     Status: Normal (Preliminary result)   Collection Time   01/08/12  4:43 PM      Component Value Range Status Comment   Specimen Description URINE, CLEAN CATCH   Final    Special Requests NONE   Final    Culture  Setup Time [id number]   Final    Colony Count  PENDING   Incomplete    Culture Culture reincubated for better growth   Final    Report Status PENDING   Incomplete     Lipid Panel No results found for this basename: CHOL,TRIG,HDL,CHOLHDL,VLDL,LDLCALC in the last 72 hours  Studies/Results: Ir Veno/ext/uni Right  01/09/2012  *RADIOLOGY REPORT*  Indication: 25 year old port, now with difficulty flushing  [name] INJECTION RIGHT UPPER EXTREMITY CENTRAL VENOGRAM  Comparisons: Chest CTA - 09/01/2011  Intravenous Medications: None  Contrast: 50 ml Omnipaque-300  Fluoroscopy Time: 2.2 minutes.  Complications: None immediate  Technique:  Informed written consent was obtained from the patient's father after a discussion of the risks, benefits and alternatives to treatment.  Questions regarding the procedure were encouraged and answered.  A timeout was performed prior to the initiation of the procedure.  A preprocedural spot radiograph was obtained of the existing right internal jugular [name].  The port had been previously accessed by the nursing staff.  The port was sterilely injected with contrast as the multiple angiographic images were obtained in various obliquities. The port was then flushed with heparinized saline.  Via the patient's existing right hand IV, and limited right upper extremity central venogram was performed.  Findings:  There is a fibrin sheath about the port-a-catheter tip with early filling of a hypertrophied azygos system and multiple mediastinal venous collaterals with eventual opacification of the SVC.  Given these findings, the decision was made to place to perform a right upper extremity central venogram via the existing right hand IV.  Contrast injection from the right hand demonstrates multiple hypertrophied venous collaterals about the right axilla and supraclavicular fossa with hypertrophy of the azygos venous system and eventual opacification of the SVC.  Impression:  1.  Fibrin sheath about the port-a-catheter tip with  early filling of the hypertrophied azygos system and multiple mediastinal venous collaterals with eventual opacification of the SVC. 2.  Central right upper extremity of the venogram demonstrates filling of multiple hypertrophied venous collaterals about the right axilla and supraclavicular fossa with opacification of hypertrophied azygos system and eventual opacification of the SVC.  Above findings discussed with Dr. Sharene Skeans at 16:56.  Original Report Authenticated By: Waynard Reeds, M.D.   Ir Cv Line Injection  01/09/2012  *RADIOLOGY REPORT*  Indication: 25 year old port, now with difficulty flushing  [name] INJECTION RIGHT UPPER EXTREMITY CENTRAL VENOGRAM  Comparisons: Chest CTA - 09/01/2011  Intravenous Medications: None  Contrast: 50 ml Omnipaque-300  Fluoroscopy Time: 2.2 minutes.  Complications: None immediate  Technique:  Informed written consent was obtained from the patient's father after a discussion of the risks, benefits and alternatives to treatment.  Questions regarding the procedure were encouraged and answered.  A timeout was performed prior to the initiation of the procedure.  A preprocedural spot radiograph was obtained of the existing right internal jugular [name].  The port had been previously accessed by the nursing staff.  The port was sterilely injected with contrast as the multiple angiographic images were obtained in various obliquities. The port was then flushed with heparinized saline.  Via the patient's existing right hand IV, and limited right upper extremity central venogram was performed.  Findings:  There is a fibrin sheath about the port-a-catheter tip with early filling of a hypertrophied azygos system and multiple mediastinal venous collaterals with eventual opacification of the SVC.  Given these findings, the decision was made to place to perform a right upper extremity central venogram via the existing right hand IV.  Contrast injection from the right hand  demonstrates multiple hypertrophied venous collaterals about the right axilla and supraclavicular fossa with hypertrophy of the azygos venous system and eventual opacification of the SVC.  Impression:  1.  Fibrin sheath about the port-a-catheter tip with early filling of the hypertrophied azygos system and multiple mediastinal venous collaterals with eventual opacification of the SVC. 2.  Central right upper extremity of the venogram demonstrates filling of multiple hypertrophied venous collaterals about the right axilla and supraclavicular fossa with opacification of hypertrophied azygos system and eventual opacification of the SVC.  Above findings discussed with Dr. Sharene Skeans at 16:56.  Original Report Authenticated By: Waynard Reeds, M.D.   Dg Chest Portable 1 View  01/08/2012  *RADIOLOGY REPORT*  Clinical Data: Seizures.  Pacemaker.  PORTABLE CHEST - 1 VIEW  Comparison: None available.  Findings: A right IJ [name] and left-sided neural stimulator are stable.  Heart size is normal.  The lungs are clear.  The visualized soft tissues and bony thorax are unremarkable.  IMPRESSION: No acute cardiopulmonary disease or significant interval change.  Original Report Authenticated By: Jamesetta Orleans. MATTERN, M.D.    Medications:  I have reviewed the patient's current medications. Scheduled:   . carbamazepine  200 mg Oral To Minor  . carbamazepine  250 mg Oral TID  . lacosamide  50 mg Oral TID  . lacosamide  50 mg Oral To Minor  . levetiracetam  500 mg Intravenous To Minor  . levETIRAcetam  1,500 mg Oral BID  . levETIRAcetam  1,500 mg Oral To Minor  . levothyroxine  50 mcg Oral Q breakfast  . loratadine  10 mg Oral Daily  . potassium chloride SA  20 mEq Oral QID  . potassium phosphate (monobasic)  500 mg Oral TID AC & HS  . potassium phosphate (monobasic)  500 mg Oral To Minor  . topiramate  150 mg Oral TID  . topiramate  150 mg Oral To Minor  . warfarin  7.5 mg Oral ONCE-1800  . Warfarin -  Pharmacist Dosing Inpatient   Does not apply Once  . Warfarin - Pharmacist Dosing Inpatient   Does not apply q1800  . DISCONTD: sodium chloride   Intravenous Once  . DISCONTD: carbamazepine  200 mg Oral TID  . DISCONTD: carbamazepine  50 mg Oral TID  . 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  Tegretol has been increased.     Plan:  1.  Will continue to observe overnight and if stable in the morning will anticipate discharge.   Subclavian thrombosis   Assessment:  Remains as noted on testing today   Plan: 1.  Pharmacy following Coumadin.  Will be followed up by [facility] after discharge. [name] occlusion   Assessment:  [name] not going be usable at this time.  Family aware.  Would like better seizure control prior to its replacement.   Plan: 1.  Will schedule for replacement as an outpatient.    *Case discussed with Dr. Sharene Skeans    LOS: 1 day   Thana Farr, MD [facility] [phone] 01/09/2012  5:53 PM

## 2012-01-10 LAB — CBC
HCT: 35.8 % — ABNORMAL LOW (ref 39.0–52.0)
Hemoglobin: 11.8 g/dL — ABNORMAL LOW (ref 13.0–17.0)
MCH: 27.5 pg (ref 26.0–34.0)
MCV: 83.4 fL (ref 78.0–100.0)
Platelets: 221 10*3/uL (ref 150–400)
RBC: 4.29 MIL/uL (ref 4.22–5.81)
WBC: 3.8 10*3/uL — ABNORMAL LOW (ref 4.0–10.5)

## 2012-01-10 LAB — URINE CULTURE: Colony Count: 9000

## 2012-01-10 MED ORDER — WARFARIN SODIUM 5 MG PO TABS
5.0000 mg | ORAL_TABLET | Freq: Once | ORAL | Status: DC
  Filled 2012-01-10: qty 1

## 2012-01-10 MED ORDER — CARBAMAZEPINE 100 MG PO CHEW: CHEWABLE_TABLET | ORAL | Status: DC

## 2012-01-10 NOTE — Plan of Care (Signed)
Problem: Phase I Progression Outcomes Goal: Initial discharge plan identified Outcome: Completed/Met Date Met:  01/10/12 Home with parents

## 2012-01-10 NOTE — Progress Notes (Signed)
ANTICOAGULATION CONSULT NOTE - Follow Up Consult  Pharmacy Consult for Warfarin Indication: Hx of clot in arm  Allergies  Allergen Reactions  . Divalproex Sodium Other (See Comments)    Nausea, vomiting, liver and kidney dysfunction  . Phenytoin Rash    Hypersensitivity  reaction  . Vancomycin Rash    Redman's Syndrome    Patient Measurements: Height: 5\' 3"  (160 cm) Weight: 89 lb 4.8 oz (40.506 kg) IBW/kg (Calculated) : 56.9   Vital Signs:    Labs:  Basename 01/10/12 0620 01/09/12 0540 01/08/12 2048  HGB 11.8* -- --  HCT 35.8* -- --  PLT 221 -- --  APTT -- -- --  LABPROT 22.8* 20.5* 19.7*  INR 1.97* 1.72* 1.64*  HEPARINUNFRC -- -- --  CREATININE -- -- --  CKTOTAL -- -- --  CKMB -- -- --  TROPONINI -- -- --   Estimated Creatinine Clearance: 81.6 ml/min (by C-G formula based on Cr of 0.71).   Assessment: 26 y.o. M on warfarin for history of clot in arm with a slightly  SUBtherapeutic INR this a.m. (INR 1.97 << 1.72, goal of 2-3) after giving a higher warfarin dose yesterday evening. Hgb/Hct/Plt ok. No s/sx of bleeding noted at this time. Carbamazepine dose was increased this admission which may increase the amount of warfarin required. PTA dose was 5 mg daily EXCEPT for 7.5 mg on Sundays only.   Goal of Therapy:  INR 2-3   Plan:  1. Warfarin 5 mg x 1 dose at 1800 today 2. Will continue to monitor for any signs/symptoms of bleeding and will follow up with PT/INR in the a.m. (if still here)  Georgina Pillion, PharmD, BCPS Clinical Pharmacist Pager: [phone] 01/10/2012 10:38 AM

## 2012-01-10 NOTE — Discharge Summary (Signed)
Patient ID: [name] [id number] 26 y.o. 1986-08-17  Admit date: 01/08/2012  Discharge date and time: 01/10/2012, 11:32 AM  Admitting Physician: [name]   Discharge Physician: Dr. 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 male with a history of generalized and partial sensory seizures as well as mental retardation and Brown's syndrome, presenting with a marked increase in number of partial as well as generalized seizures since about 2 AM 01/08/12. Patient has had 15-20 sensory seizures involving his left upper extremity as well as 10-12 generalized seizures. He takes Tegretol 200 mg 3 times a day, Keppra 1500 mg twice a day, Topamax 150 mg 3 times a day and the Vimpat 50 mg 3 times a day for routine maintenance, and has a vagal nerve stimulator. He was given intramuscular Valium 10 mg at home. His mother thinks that most of the medication went on to the bed. He was given an additional 2 mg of Valium by mouth. He was also given Ativan 2 mg IV by way of his [name], and also phenobarbital 65 mg per  [name]. He was subsequently brought to the emergency room for further evaluation. He continued to have sensory seizures in the emergency room as well as multiple witnessed generalized seizures. He was given 500 mg of Keppra.  No further seizure activity was noted. Patient was admitted for overnight observation because of the increase in seizure frequency and somewhat refractory nature of his current seizure flurry. Dr. Sharene Skeans, his neurologist was consulted and followed the patient during the stay.  The patient remained seizure-free during the first hospital night stay.  Carbamazepine was increased.  The patient underwent [name] injection with right upper extremity venogram to assess port patency on 01/09/12.  This revealed a  fibrin sheath about the port-a-catheter tip with early filling of the hypertrophied azygos system and multiple mediastinal venous collaterals with eventual opacification of the SVC. Central right upper extremity of the venogram demonstrates  filling of multiple hypertrophied venous collaterals about the right axilla and supraclavicular fossa with opacification of  hypertrophied azygos system and eventual opacification of the SVC.  Dr. Sharene Skeans spoke with NeuroInterventionalist about removing the old prot and replacing it, as the family uses the access to treat innumerable seizures at home. He will also get a second opinion from vascular surgery and schedule the procedure for the near future.  The patient is on Coumadin for left subclavian vein thrombosis and would require heparin bridging for the procedure.    On 01/09/12, the patient had 8 sensory seizures that responded to Ativan.  He remained seizure free through the day and on 01/10/12 is deemed stable for discharge to home.  The patient was seen by Dr. Sharene Skeans and discharge instructions were reviewed with the family.  The  patient is ready for discharge.   According to Dr. Sharene Skeans: Discharge medications should include  warfarin 7.5 mg he is followed by [facility]  Topiramate 150 mg by mouth 3 times a day  Carbamazepine 200 mg one by mouth 3 times a day  Carbamazepine 100 mg one half by mouth 3 times a day (This is his old dose, I think that he still has this medicine at home)  Levetiracetam 1500 mg by mouth twice a day  Lacosamide 50 mg by mouth 3 times a day  Levothyroxine 50 mcg per day  Klor-Con 20 mEq 4 times daily  K-Phos 500 mg 4 times daily  The family wouldn't has been told not to use the port but to bring him in if he has recurrent seizures that they cannot control at home.  Consults: Dr. Sharene Skeans, pediatric neurologist  Significant Diagnostic Studies:  Basic Metabolic Panel:  CBC:  Lab 01/10/12 0620  WBC 3.8*  NEUTROABS --  HGB 11.8*  HCT 35.8*  MCV 83.4  PLT 221   Coagulation:  Lab 01/10/12 0620 01/09/12 0540 01/08/12 2048  LABPROT 22.8* 20.5* 19.7*  INR 1.97* 1.72* 1.64*   Urinalysis:  Lab 01/08/12 1643  COLORURINE YELLOW  LABSPEC 1.009  PHURINE 7.5  GLUCOSEU 100*  HGBUR NEGATIVE  BILIRUBINUR NEGATIVE  KETONESUR NEGATIVE  PROTEINUR NEGATIVE  UROBILINOGEN 0.2  NITRITE NEGATIVE  LEUKOCYTESUR NEGATIVE   Misc. Labs 01/08/12 Phenobarb level 4.0 01/08/12 Carbamazapine level 7.9 01/08/12 Topiramate level pending at time of discharge.  01/08/2012  PORTABLE CHEST - 1 VIEW . Findings: A right IJ [name] and left-sided neural stimulator are stable. Heart size is normal. The lungs are clear. The visualized soft tissues and bony thorax are unremarkable. IMPRESSION: No acute cardiopulmonary disease or significant interval change.  CHRISTOPHER W. MATTERN, M.D.   01/09/2012 [name] INJECTION RIGHT UPPER EXTREMITY CENTRAL VENOGRAM Findings: There is a fibrin sheath about the port-a-catheter tip with early filling of a hypertrophied azygos system and multiple mediastinal venous collaterals with eventual opacification of the SVC. Given these findings, the decision was  made to place to perform a right upper extremity central venogram via the existing right hand IV. Contrast injection from the right hand demonstrates multiple hypertrophied venous collaterals about the right axilla and supraclavicular fossa with hypertrophy of the azygos venous system and eventual opacification of the SVC. Impression: 1. Fibrin sheath about the port-a-catheter tip with early filling of the hypertrophied azygos system and multiple mediastinal venous collaterals with eventual opacification of the SVC. 2. Central right upper extremity of the venogram demonstrates filling of multiple hypertrophied venous collaterals about the right axilla and supraclavicular fossa with opacification of hypertrophied azygos system and eventual opacification of the SVC. Waynard Reeds, M.D.   Treatments: adjustment of seizure medications  Discharge Exam: (per Dr. Sharene Skeans) Gen:No distress, at physical and neurological baseline  HEENT:No signs of infection; Some swelling still experiences in his face and neck  ZO:[id number] murmur at the left border, normal pulses and capillary refill  XBJ:YNWGN clear to auscultation  FAO:ZHYQ bowel sounds normal  Ext/Musc:Small, decreased muscle mass, no spasticity  Neuro: Awake alert, mild dysarthria cognitive impairment, right-handed  Visual fields full extraocular movements full but not completely conjugate. Symmetric facial strength midline tongue  Normal functional strength, clumsy fine motor movements, no drift  Gait not tested  arreflexic  Disposition: 01-Home or Self Care  Patient Instructions:  Medication List  As of 01/10/2012 11:32 AM   ASK your doctor about these  medications         carbamazepine 100 MG chewable tablet   Commonly known as: TEGRETOL   Chew 200 mg by mouth 3 (three) times daily. 8am 1:30p and 9pm      cetirizine 10 MG tablet   Commonly known as: ZYRTEC   Take 10 mg by mouth every morning.      diazepam 2 MG tablet   Commonly  known as: VALIUM   Take 2 mg by mouth as needed. Take as needed when having sensation seizure.      Diazepam 10 MG/2ML Devi   Inject 10 mg into the muscle daily as needed. For seizures      levETIRAcetam 500 MG tablet   Commonly known as: KEPPRA   Take 1,500 mg by mouth 2 (two) times daily.      levothyroxine 50 MCG tablet   Commonly known as: SYNTHROID, LEVOTHROID   Take 50 mcg by mouth daily.      LORazepam 2 MG/ML injection   Commonly known as: ATIVAN   Inject 2 mg into the vein every 8 (eight) hours as needed. For seizure/ anxiety      phenobarbital 60 MG/ML injection   Commonly known as: LUMINAL   Inject 65 mg into the vein daily as needed. For seizures      potassium chloride SA 20 MEQ tablet   Commonly known as: K-DUR,KLOR-CON   Take 20 mEq by mouth 4 (four) times daily. Take at 7a,130p,5p,and 9p      potassium phosphate (monobasic) 500 MG tablet   Commonly known as: K-PHOS ORIGINAL   Take 500 mg by mouth 4 (four) times daily. Take at 7a, 130p, 5p, and 9p      TOPAMAX 100 MG tablet   Generic drug: topiramate   Take 150 mg by mouth 3 (three) times daily. Take at 7a, 130p, and 9p      VIMPAT 10 MG/ML Soln   Generic drug: Lacosamide   Take 5 mg by mouth 3 (three) times daily.      warfarin 5 MG tablet   Commonly known as: COUMADIN   Take 5-7.5 mg by mouth daily. Takes 5mg  tue thru sun and 7.5 mg on mon             Follow-up with Dr. Sharene Skeans on 01/12/12 as scheduled.   Marya Fossa PA-C [facility] [phone] 01/10/2012 11:32 AM

## 2012-01-10 NOTE — Progress Notes (Signed)
Patient ID: [name], male   DOB: Jan 23, 1986, 26 y.o.   MRN: [id number] [facility] Progress Note  Patient name: [name] Medical record number: [id number] Date of birth: 05-08-1986 Age: 26 y.o. Gender: male    LOS: 2 days   Primary Care Provider: [name], MD, MD  Overnight Events: Marcus Fus has been seizure free.  I spent 40 minutes speaking with the family concerning the diagnostic procedures performed yesterday and outlining my recommendations for further treatment.  Long-term, we need to try to replace the port.  This kept him out of the hospital as well the family to treat innumerable seizures at home.  I spoken at length with the neuro intervention was, and he believes that he can both removed the old port, and vision in one.  Given the poor has been in for 13 years, I need to obtain a second opinion from vascular surgery to make certain that the attempt to remove this for is not met with significant internal bleeding.  Also there is any difficulties placing a port, I would rather have this done by a surgeon, than a neuro-interventionalist.  If something goes wrong, they will need to call the surgeon for assistance.  The patient also will need to be taken off of Coumadin which is risky at best given clot within his left subclavian vein.This will require a IV heparin which I hope to be done as an outpatient so he is not need to be admitted to the hospital.  Coumadin can then be discontinued until prothrombin time reaches control, heparin can be stopped and as a procedure can be carried out.  Whether or not removal and replacement can be done the same day he is also unknown to me.  I will see the patient on April 22 in my office.  I don't know If I will have all the answers at that time.  I answered the family's questions in detail.  They are in agreement with this plan.  Objective: Vital signs in last 24 hours: Temp:  [97.9 F (36.6 C)-98.4 F (36.9 C)] 97.9  F (36.6 C) (04/19 2100) Pulse Rate:  [68-90] 68  (04/19 2100) Resp:  [14-20] 14  (04/19 2100) BP: (73-89)/(44-54) 89/54 mmHg (04/19 2100) SpO2:  [99 %-100 %] 100 % (04/19 2100)  Wt Readings from Last 3 Encounters:  01/08/12 40.506 kg (89 lb 4.8 oz)  12/08/11 44.453 kg (98 lb)  08/29/11 43.999 kg (97 lb)    Intake/Output Summary (Last 24 hours) at 01/10/12 0911 Last data filed at 01/10/12 0900  Gross per 24 hour  Intake    120 ml  Output      0 ml  Net    120 ml    Current Facility-Administered Medications  Medication Dose Route Frequency Provider Last Rate Last Dose  . 0.9 %  sodium chloride infusion   Intravenous Continuous [name] 50 mL/hr at 01/09/12 2025    . acetaminophen (TYLENOL) tablet 650 mg  650 mg Oral Q6H PRN [name]       Or  . acetaminophen (TYLENOL) suppository 650 mg  650 mg Rectal Q6H PRN [name]      . carbamazepine (TEGRETOL) chewable tablet 200 mg  200 mg Oral To Marcus Nelia Shi, MD      . carbamazepine (TEGRETOL) chewable tablet 250 mg  250 mg Oral TID [name]   250 mg at 01/10/12 0841  . diazepam (VALIUM) injection 10 mg  10 mg Intramuscular Daily  PRN [name]      . diazepam (VALIUM) tablet 2 mg  2 mg Oral PRN [name]      . iohexol (OMNIPAQUE) 300 MG/ML solution 100 mL  100 mL Intravenous Once PRN Marcus Come, MD   50 mL at 01/09/12 1639  . lacosamide (VIMPAT) tablet 50 mg  50 mg Oral TID [name]   50 mg at 01/10/12 0845  . lacosamide (VIMPAT) tablet 50 mg  50 mg Oral To Marcus Nelia Shi, MD      . levETIRAcetam (KEPPRA) tablet 1,500 mg  1,500 mg Oral BID [name]   1,500 mg at 01/10/12 0844  . levETIRAcetam (KEPPRA) tablet 1,500 mg  1,500 mg Oral To Marcus Nelia Shi, MD      . levothyroxine (SYNTHROID, LEVOTHROID) tablet 50 mcg  50 mcg Oral Q breakfast [name]   50 mcg at 01/10/12 0843  . loratadine (CLARITIN) tablet 10 mg  10 mg Oral Daily [name]   10 mg at  01/09/12 1610  . LORazepam (ATIVAN) injection 2 mg  2 mg Intravenous Q8H PRN [name]   2 mg at 01/09/12 0947  . ondansetron (ZOFRAN) tablet 4 mg  4 mg Oral Q6H PRN [name]       Or  . ondansetron [facility]) injection 4 mg  4 mg Intravenous Q6H PRN [name]      . potassium chloride SA (K-DUR,KLOR-CON) CR tablet 20 mEq  20 mEq Oral QID [name]   20 mEq at 01/10/12 0841  . potassium phosphate (monobasic) (K-PHOS ORIGINAL) tablet 500 mg  500 mg Oral TID AC & HS [name]   500 mg at 01/10/12 0846  . potassium phosphate (monobasic) (K-PHOS ORIGINAL) tablet 500 mg  500 mg Oral To Marcus Nelia Shi, MD      . topiramate (TOPAMAX) 150 mg  150 mg Oral TID [name]   150 mg at 01/09/12 2118  . topiramate (TOPAMAX) tablet 150 mg  150 mg Oral To Marcus Nelia Shi, MD      . warfarin (COUMADIN) tablet 7.5 mg  7.5 mg Oral ONCE-1800 [name], PHARMD   7.5 mg at 01/09/12 1743  . Warfarin - Pharmacist Dosing Inpatient   Does not apply q1800 Nelia Shi, MD       PE: Gen:No distress, at physical and neurological baseline HEENT:No signs of infection; Some swelling still experiences in his face and neck RU:[id number] murmur at the left border, normal pulses and capillary refill NWG:NFAOZ clear to auscultation HYQ:MVHQ bowel sounds normal Ext/Musc:Small, decreased muscle mass, no  spasticity Neuro:Awake alert, mild dysarthria cognitive impairment, right-handed Visual fields full extraocular movements full but not completely conjugate.  Symmetric facial strength midline tongue Normal functional strength, clumsy fine motor movements, no drift Gait not tested arreflexic  Labs/Studies: None  Assessment/Plan: The patient is ready for discharge. Discharge medications should include warfarin 7.5 mg he is followed by [facility] Topiramate 150 mg by mouth 3 times a day Carbamazepine 200 mg one by mouth 3 times a day Carbamazepine 100  mg one half by mouth 3 times a day (This is his old dose, I think that he still has this medicine at home) Levetiracetam 1500 mg by mouth twice a day Lacosamide 50 mg by mouth twice a day Levothyroxine 50 mcg per day Klor-Con 20 mEq 4 times daily K-Phos 500 mg 4 times daily  The family wouldn't has been told not to  use the port but to bring him in if he has recurrent seizures that they cannot control at home.  I tried to call the Neuro- hospitalist on call.  I still await his call.  SignedDeetta Perla, MD Child neurology attending [phone] 01/10/2012 9:11 AM

## 2012-01-12 LAB — TOPIRAMATE LEVEL: Topiramate Lvl: 13.4 ug/mL (ref 2–25)

## 2012-01-16 ENCOUNTER — Ambulatory Visit (INDEPENDENT_AMBULATORY_CARE_PROVIDER_SITE_OTHER): Payer: Medicaid Other | Admitting: *Deleted

## 2012-01-16 DIAGNOSIS — I82629 Acute embolism and thrombosis of deep veins of unspecified upper extremity: Secondary | ICD-10-CM

## 2012-01-16 DIAGNOSIS — Z7901 Long term (current) use of anticoagulants: Secondary | ICD-10-CM

## 2012-02-02 ENCOUNTER — Ambulatory Visit (INDEPENDENT_AMBULATORY_CARE_PROVIDER_SITE_OTHER): Payer: Medicaid Other | Admitting: Pharmacist

## 2012-02-02 DIAGNOSIS — Z7901 Long term (current) use of anticoagulants: Secondary | ICD-10-CM

## 2012-02-02 DIAGNOSIS — I82629 Acute embolism and thrombosis of deep veins of unspecified upper extremity: Secondary | ICD-10-CM

## 2012-02-27 ENCOUNTER — Ambulatory Visit (INDEPENDENT_AMBULATORY_CARE_PROVIDER_SITE_OTHER): Payer: Medicaid Other | Admitting: *Deleted

## 2012-02-27 DIAGNOSIS — Z7901 Long term (current) use of anticoagulants: Secondary | ICD-10-CM

## 2012-02-27 DIAGNOSIS — I82629 Acute embolism and thrombosis of deep veins of unspecified upper extremity: Secondary | ICD-10-CM

## 2012-02-27 LAB — POCT INR: INR: 1.6

## 2012-03-08 ENCOUNTER — Ambulatory Visit (INDEPENDENT_AMBULATORY_CARE_PROVIDER_SITE_OTHER): Payer: Medicaid Other | Admitting: Pharmacist

## 2012-03-08 DIAGNOSIS — I82629 Acute embolism and thrombosis of deep veins of unspecified upper extremity: Secondary | ICD-10-CM

## 2012-03-08 DIAGNOSIS — Z7901 Long term (current) use of anticoagulants: Secondary | ICD-10-CM

## 2012-03-08 LAB — POCT INR: INR: 2

## 2012-03-16 ENCOUNTER — Institutional Professional Consult (permissible substitution): Payer: Medicaid Other | Admitting: Cardiovascular Disease

## 2012-03-18 ENCOUNTER — Other Ambulatory Visit: Payer: Self-pay | Admitting: Pediatrics

## 2012-03-18 DIAGNOSIS — I871 Compression of vein: Secondary | ICD-10-CM

## 2012-03-19 ENCOUNTER — Ambulatory Visit (INDEPENDENT_AMBULATORY_CARE_PROVIDER_SITE_OTHER): Payer: Medicaid Other | Admitting: *Deleted

## 2012-03-19 DIAGNOSIS — I82629 Acute embolism and thrombosis of deep veins of unspecified upper extremity: Secondary | ICD-10-CM

## 2012-03-19 DIAGNOSIS — Z7901 Long term (current) use of anticoagulants: Secondary | ICD-10-CM

## 2012-03-19 LAB — POCT INR: INR: 2.3

## 2012-03-23 ENCOUNTER — Other Ambulatory Visit: Payer: Self-pay | Admitting: Interventional Radiology

## 2012-03-23 ENCOUNTER — Other Ambulatory Visit: Payer: Self-pay | Admitting: Radiology

## 2012-03-23 ENCOUNTER — Telehealth (HOSPITAL_COMMUNITY): Payer: Self-pay

## 2012-03-23 ENCOUNTER — Ambulatory Visit
Admission: RE | Admit: 2012-03-23 | Discharge: 2012-03-23 | Disposition: A | Discharge status: Home / Self Care | Payer: Medicaid Other | Source: Ambulatory Visit | Attending: Pediatrics | Admitting: Pediatrics

## 2012-03-23 DIAGNOSIS — I871 Compression of vein: Secondary | ICD-10-CM

## 2012-03-23 NOTE — Telephone Encounter (Signed)
I spoke with pts father and scheduled procedure for Friday.  I left  vm for Tammy

## 2012-03-26 ENCOUNTER — Ambulatory Visit (INDEPENDENT_AMBULATORY_CARE_PROVIDER_SITE_OTHER): Payer: Medicaid Other | Admitting: Pharmacist

## 2012-03-26 ENCOUNTER — Ambulatory Visit (HOSPITAL_COMMUNITY)
Admission: RE | Admit: 2012-03-26 | Discharge: 2012-03-26 | Disposition: A | Discharge status: Home / Self Care | Payer: Medicaid Other | Source: Ambulatory Visit | Attending: Interventional Radiology | Admitting: Interventional Radiology

## 2012-03-26 ENCOUNTER — Telehealth: Payer: Self-pay | Admitting: Cardiovascular Disease

## 2012-03-26 ENCOUNTER — Encounter (HOSPITAL_COMMUNITY): Payer: Self-pay

## 2012-03-26 ENCOUNTER — Other Ambulatory Visit: Payer: Self-pay | Admitting: Interventional Radiology

## 2012-03-26 DIAGNOSIS — I82629 Acute embolism and thrombosis of deep veins of unspecified upper extremity: Secondary | ICD-10-CM

## 2012-03-26 DIAGNOSIS — T82898A Other specified complication of vascular prosthetic devices, implants and grafts, initial encounter: Secondary | ICD-10-CM | POA: Insufficient documentation

## 2012-03-26 DIAGNOSIS — Y849 Medical procedure, unspecified as the cause of abnormal reaction of the patient, or of later complication, without mention of misadventure at the time of the procedure: Secondary | ICD-10-CM | POA: Insufficient documentation

## 2012-03-26 DIAGNOSIS — I871 Compression of vein: Secondary | ICD-10-CM

## 2012-03-26 DIAGNOSIS — Z7901 Long term (current) use of anticoagulants: Secondary | ICD-10-CM

## 2012-03-26 LAB — BASIC METABOLIC PANEL
CO2: 19 mEq/L (ref 19–32)
Calcium: 8.8 mg/dL (ref 8.4–10.5)
Creatinine, Ser: 0.85 mg/dL (ref 0.50–1.35)
Glucose, Bld: 92 mg/dL (ref 70–99)

## 2012-03-26 LAB — APTT: aPTT: 48 seconds — ABNORMAL HIGH (ref 24–37)

## 2012-03-26 LAB — CBC
MCH: 28.2 pg (ref 26.0–34.0)
MCHC: 33.8 g/dL (ref 30.0–36.0)
MCV: 83.6 fL (ref 78.0–100.0)
Platelets: 224 10*3/uL (ref 150–400)
RDW: 13.5 % (ref 11.5–15.5)

## 2012-03-26 MED ORDER — SODIUM CHLORIDE 0.9 % IV SOLN: Freq: Once | INTRAVENOUS | Status: DC

## 2012-03-26 MED ORDER — CHLORHEXIDINE GLUCONATE 4 % EX LIQD
CUTANEOUS | Status: AC
  Filled 2012-03-26: qty 15

## 2012-03-26 MED ORDER — CEFAZOLIN SODIUM 1-5 GM-% IV SOLN: 1.0000 g | Freq: Once | INTRAVENOUS | Status: DC

## 2012-03-26 MED ORDER — IOHEXOL 300 MG/ML  SOLN
150.0000 mL | Freq: Once | INTRAMUSCULAR | Status: AC | PRN
  Administered 2012-03-26: 60 mL via INTRAVENOUS

## 2012-03-26 NOTE — Telephone Encounter (Signed)
Did not get the phone message until pt had already arrived for his appt.  See anticoagulation note.

## 2012-03-26 NOTE — Telephone Encounter (Signed)
Pt's mom calling re being told blood clot gone by radiologist dr Jonny Ruiz watts,  does he need to continue coumadin or not? Has appt this pm for coumadin

## 2012-03-26 NOTE — H&P (Signed)
[name] is an 26 y.o. male.   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 ALLERGIC RHINITIS 12/26/2009  . MITRAL VALVE PROLAPSE 06/22/2007  . SEIZURE DISORDER 06/22/2007  . Unspecified hypothyroidism 06/22/2007  . Growth disorder   . Mental retardation, mild (I.Q. 50-70)   . Brown's syndrome   . Irregular heart beat   . Heart murmur     VSD  . Brown's syndrome, bilateral   . Peripheral vascular disease   . Sleep apnea     Past Surgical History  Procedure Date  . Implantation vagal nerve stimulator   . Portacath placement   . Eye surgery     X2 for Brown Syndrome    Family History  Problem Relation Age of Onset  . Breast cancer Mother   . Asthma Father    Social History:  reports that he has never smoked. He has never used smokeless tobacco. He reports that he does not drink alcohol or use illicit drugs.  Allergies:  Allergies  Allergen Reactions  . Divalproex Sodium Other (See Comments)    Nausea, vomiting, liver and kidney dysfunction  . Phenytoin Rash    Hypersensitivity  reaction  . Vancomycin Rash    Redman's Syndrome     (Not in a hospital admission)  No results found for this or any previous visit (from the past 48 hour(s)). No results found.  Review of Systems  Constitutional: Negative for fever and chills.  Eyes:       Browns syndrome  Respiratory: Negative for cough.   Cardiovascular: Negative for chest pain.  Gastrointestinal: Negative for nausea and vomiting.  Neurological: Negative for headaches.  Psychiatric/Behavioral:       Mildly mentally retarded    Blood pressure 97/70, pulse 76, temperature 97.1 F (36.2 C), temperature source Oral, resp.  rate 18, height 5\' 3"  (1.6 m), weight 96 lb (43.545 kg), SpO2 100.00%. Physical Exam  Constitutional: He appears well-nourished.  Cardiovascular: Normal rate and regular rhythm.   Murmur heard. Respiratory: Effort normal and breath sounds normal. He has no wheezes.  GI: Soft. Bowel sounds are normal. There is no tenderness.  Musculoskeletal: Normal range of motion.  Neurological: He is alert.       Mildly mentally retarded  Skin: Skin is warm and dry.  Psychiatric: He has a normal mood and affect. His behavior is normal. Thought content normal.     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 Consent in chart  Lauria Depoy A 03/26/2012, 8:08 AM

## 2012-03-26 NOTE — Telephone Encounter (Signed)
Will forward to coumadin clinic.

## 2012-04-01 ENCOUNTER — Other Ambulatory Visit (HOSPITAL_COMMUNITY): Payer: Self-pay | Admitting: Interventional Radiology

## 2012-04-01 DIAGNOSIS — G40909 Epilepsy, unspecified, not intractable, without status epilepticus: Secondary | ICD-10-CM

## 2012-04-02 ENCOUNTER — Encounter (HOSPITAL_COMMUNITY): Payer: Self-pay | Admitting: Pharmacy Technician

## 2012-04-05 ENCOUNTER — Other Ambulatory Visit: Payer: Self-pay | Admitting: Radiology

## 2012-04-06 ENCOUNTER — Other Ambulatory Visit: Payer: Self-pay | Admitting: Radiology

## 2012-04-07 ENCOUNTER — Other Ambulatory Visit: Payer: Self-pay | Admitting: Radiology

## 2012-04-08 ENCOUNTER — Encounter (HOSPITAL_COMMUNITY): Payer: Self-pay | Admitting: Surgery

## 2012-04-08 ENCOUNTER — Encounter (HOSPITAL_COMMUNITY): Payer: Self-pay | Admitting: Anesthesiology

## 2012-04-08 ENCOUNTER — Encounter (HOSPITAL_COMMUNITY): Payer: Self-pay

## 2012-04-08 ENCOUNTER — Ambulatory Visit (HOSPITAL_COMMUNITY)
Admission: RE | Admit: 2012-04-08 | Discharge: 2012-04-08 | Disposition: A | Discharge status: Home / Self Care | Payer: Medicaid Other | Source: Ambulatory Visit | Attending: Interventional Radiology | Admitting: Interventional Radiology

## 2012-04-08 ENCOUNTER — Inpatient Hospital Stay (HOSPITAL_COMMUNITY)
Admission: RE | Admit: 2012-04-08 | Discharge: 2012-04-10 | DRG: 101 | Disposition: A | Discharge status: Home / Self Care | Payer: Medicaid Other | Source: Ambulatory Visit | Attending: Neurology | Admitting: Neurology

## 2012-04-08 ENCOUNTER — Encounter (HOSPITAL_COMMUNITY)
Admission: RE | Disposition: A | Discharge status: Home / Self Care | Payer: Self-pay | Source: Ambulatory Visit | Attending: Neurology

## 2012-04-08 ENCOUNTER — Ambulatory Visit (HOSPITAL_COMMUNITY): Payer: Medicaid Other | Admitting: Anesthesiology

## 2012-04-08 ENCOUNTER — Encounter (HOSPITAL_COMMUNITY): Payer: Self-pay | Admitting: *Deleted

## 2012-04-08 DIAGNOSIS — G40901 Epilepsy, unspecified, not intractable, with status epilepticus: Secondary | ICD-10-CM

## 2012-04-08 DIAGNOSIS — Z79899 Other long term (current) drug therapy: Secondary | ICD-10-CM

## 2012-04-08 DIAGNOSIS — G40309 Generalized idiopathic epilepsy and epileptic syndromes, not intractable, without status epilepticus: Secondary | ICD-10-CM | POA: Diagnosis present

## 2012-04-08 DIAGNOSIS — F70 Mild intellectual disabilities: Secondary | ICD-10-CM | POA: Diagnosis present

## 2012-04-08 DIAGNOSIS — I739 Peripheral vascular disease, unspecified: Secondary | ICD-10-CM | POA: Diagnosis present

## 2012-04-08 DIAGNOSIS — G40909 Epilepsy, unspecified, not intractable, without status epilepticus: Secondary | ICD-10-CM

## 2012-04-08 DIAGNOSIS — Z7901 Long term (current) use of anticoagulants: Secondary | ICD-10-CM

## 2012-04-08 DIAGNOSIS — E876 Hypokalemia: Secondary | ICD-10-CM | POA: Diagnosis present

## 2012-04-08 DIAGNOSIS — Z86718 Personal history of other venous thrombosis and embolism: Secondary | ICD-10-CM

## 2012-04-08 DIAGNOSIS — H5060 Mechanical strabismus, unspecified: Secondary | ICD-10-CM | POA: Diagnosis present

## 2012-04-08 DIAGNOSIS — I82629 Acute embolism and thrombosis of deep veins of unspecified upper extremity: Secondary | ICD-10-CM

## 2012-04-08 DIAGNOSIS — I4891 Unspecified atrial fibrillation: Secondary | ICD-10-CM | POA: Diagnosis present

## 2012-04-08 DIAGNOSIS — I2510 Atherosclerotic heart disease of native coronary artery without angina pectoris: Secondary | ICD-10-CM | POA: Diagnosis present

## 2012-04-08 DIAGNOSIS — G40401 Other generalized epilepsy and epileptic syndromes, not intractable, with status epilepticus: Principal | ICD-10-CM | POA: Diagnosis present

## 2012-04-08 DIAGNOSIS — I499 Cardiac arrhythmia, unspecified: Secondary | ICD-10-CM

## 2012-04-08 DIAGNOSIS — E039 Hypothyroidism, unspecified: Secondary | ICD-10-CM | POA: Diagnosis present

## 2012-04-08 HISTORY — DX: Unspecified atrial fibrillation: I48.91

## 2012-04-08 HISTORY — DX: Hypokalemia: E87.6

## 2012-04-08 LAB — BASIC METABOLIC PANEL
BUN: 7 mg/dL (ref 6–23)
CO2: 19 mEq/L (ref 19–32)
Calcium: 8.4 mg/dL (ref 8.4–10.5)
GFR calc non Af Amer: >90 mL/min (ref >90)
Glucose, Bld: 105 mg/dL — ABNORMAL HIGH (ref 70–99)
Potassium: 3.1 mEq/L — ABNORMAL LOW (ref 3.5–5.1)

## 2012-04-08 LAB — CARDIAC PANEL(CRET KIN+CKTOT+MB+TROPI): Troponin I: <0.3 ng/mL (ref <0.30)

## 2012-04-08 SURGERY — RADIOLOGY WITH ANESTHESIA
Anesthesia: General

## 2012-04-08 MED ORDER — FENTANYL CITRATE 0.05 MG/ML IJ SOLN
INTRAMUSCULAR | Status: DC | PRN
  Administered 2012-04-08: 100 ug via INTRAVENOUS
  Administered 2012-04-08 (×2): 50 ug via INTRAVENOUS

## 2012-04-08 MED ORDER — PROPOFOL 10 MG/ML IV EMUL
INTRAVENOUS | Status: DC | PRN
  Administered 2012-04-08: 200 mg via INTRAVENOUS

## 2012-04-08 MED ORDER — ACETAMINOPHEN 650 MG RE SUPP: 650.0000 mg | Freq: Four times a day (QID) | RECTAL | Status: DC | PRN

## 2012-04-08 MED ORDER — TOPIRAMATE 25 MG PO TABS
150.0000 mg | ORAL_TABLET | Freq: Three times a day (TID) | ORAL | Status: DC
  Administered 2012-04-08 – 2012-04-10 (×6): 150 mg via ORAL
  Filled 2012-04-08 (×8): qty 2

## 2012-04-08 MED ORDER — SODIUM CHLORIDE 0.9 % IV SOLN
INTRAVENOUS | Status: DC
  Administered 2012-04-08 – 2012-04-09 (×2): via INTRAVENOUS

## 2012-04-08 MED ORDER — MIDAZOLAM HCL 2 MG/2ML IJ SOLN
INTRAMUSCULAR | Status: AC
  Filled 2012-04-08: qty 2

## 2012-04-08 MED ORDER — SODIUM CHLORIDE 0.9 % IJ SOLN
3.0000 mL | Freq: Two times a day (BID) | INTRAMUSCULAR | Status: DC
  Administered 2012-04-08 – 2012-04-10 (×3): 3 mL via INTRAVENOUS

## 2012-04-08 MED ORDER — PHENOBARBITAL SODIUM 60 MG/ML IJ SOLN: 65.0000 mg | Freq: Every day | INTRAMUSCULAR | Status: DC | PRN

## 2012-04-08 MED ORDER — ENOXAPARIN SODIUM 40 MG/0.4ML ~~LOC~~ SOLN
40.0000 mg | SUBCUTANEOUS | Status: DC
  Administered 2012-04-08 – 2012-04-09 (×2): 40 mg via SUBCUTANEOUS
  Filled 2012-04-08 (×3): qty 0.4

## 2012-04-08 MED ORDER — DROPERIDOL 2.5 MG/ML IJ SOLN: 0.6250 mg | INTRAMUSCULAR | Status: DC | PRN

## 2012-04-08 MED ORDER — SODIUM CHLORIDE 0.45 % IV SOLN: INTRAVENOUS | Status: DC

## 2012-04-08 MED ORDER — SODIUM CHLORIDE 0.9 % IV SOLN: Freq: Once | INTRAVENOUS | Status: DC

## 2012-04-08 MED ORDER — SODIUM CHLORIDE 0.9 % IV SOLN
1500.0000 mg | INTRAVENOUS | Status: AC
  Administered 2012-04-08: 1500 mg via INTRAVENOUS
  Filled 2012-04-08: qty 15

## 2012-04-08 MED ORDER — CHLORHEXIDINE GLUCONATE 4 % EX LIQD
CUTANEOUS | Status: AC
  Filled 2012-04-08: qty 45

## 2012-04-08 MED ORDER — ACETAMINOPHEN 325 MG PO TABS: 650.0000 mg | ORAL_TABLET | Freq: Four times a day (QID) | ORAL | Status: DC | PRN

## 2012-04-08 MED ORDER — DIAZEPAM 10 MG/2ML IM DEVI: 10.0000 mg | Freq: Every day | INTRAMUSCULAR | Status: DC | PRN

## 2012-04-08 MED ORDER — LEVOTHYROXINE SODIUM 50 MCG PO TABS
50.0000 ug | ORAL_TABLET | Freq: Every day | ORAL | Status: DC
  Administered 2012-04-09 – 2012-04-10 (×2): 50 ug via ORAL
  Filled 2012-04-08 (×3): qty 1

## 2012-04-08 MED ORDER — SODIUM CHLORIDE 0.9 % IV SOLN
10.0000 mg | INTRAVENOUS | Status: DC | PRN
  Administered 2012-04-08: 20 ug/min via INTRAVENOUS

## 2012-04-08 MED ORDER — LACOSAMIDE 10 MG/ML PO SOLN: 5.0000 mL | Freq: Three times a day (TID) | ORAL | Status: DC

## 2012-04-08 MED ORDER — CEFAZOLIN SODIUM-DEXTROSE 2-3 GM-% IV SOLR
INTRAVENOUS | Status: AC
  Filled 2012-04-08: qty 50

## 2012-04-08 MED ORDER — POTASSIUM PHOSPHATE MONOBASIC 500 MG PO TABS
500.0000 mg | ORAL_TABLET | Freq: Four times a day (QID) | ORAL | Status: DC
  Administered 2012-04-08 – 2012-04-10 (×6): 500 mg via ORAL
  Filled 2012-04-08 (×10): qty 1

## 2012-04-08 MED ORDER — PHENOBARBITAL SODIUM 65 MG/ML IJ SOLN: 65.0000 mg | Freq: Every day | INTRAMUSCULAR | Status: DC | PRN

## 2012-04-08 MED ORDER — HYDROMORPHONE HCL PF 1 MG/ML IJ SOLN: 0.2500 mg | INTRAMUSCULAR | Status: DC | PRN

## 2012-04-08 MED ORDER — LEVETIRACETAM 750 MG PO TABS
1500.0000 mg | ORAL_TABLET | Freq: Two times a day (BID) | ORAL | Status: DC
  Filled 2012-04-08: qty 2

## 2012-04-08 MED ORDER — MIDAZOLAM HCL 5 MG/5ML IJ SOLN
INTRAMUSCULAR | Status: DC | PRN
  Administered 2012-04-08: 2 mg via INTRAVENOUS
  Administered 2012-04-08: 1 mg via INTRAVENOUS

## 2012-04-08 MED ORDER — LORAZEPAM 2 MG/ML IJ SOLN
2.0000 mg | INTRAMUSCULAR | Status: DC | PRN
  Administered 2012-04-08 – 2012-04-09 (×2): 2 mg via INTRAVENOUS
  Filled 2012-04-08: qty 1

## 2012-04-08 MED ORDER — CEFAZOLIN SODIUM 1-5 GM-% IV SOLN: 1.0000 g | Freq: Once | INTRAVENOUS | Status: DC

## 2012-04-08 MED ORDER — CARBAMAZEPINE 100 MG PO CHEW
250.0000 mg | CHEWABLE_TABLET | Freq: Three times a day (TID) | ORAL | Status: DC
  Administered 2012-04-08 – 2012-04-10 (×6): 250 mg via ORAL
  Filled 2012-04-08 (×9): qty 2.5

## 2012-04-08 MED ORDER — DIAZEPAM 5 MG/ML IJ SOLN
10.0000 mg | Freq: Every day | INTRAMUSCULAR | Status: DC | PRN
  Administered 2012-04-08 – 2012-04-09 (×2): 10 mg via INTRAMUSCULAR
  Filled 2012-04-08 (×3): qty 2

## 2012-04-08 MED ORDER — LEVETIRACETAM 750 MG PO TABS
1500.0000 mg | ORAL_TABLET | Freq: Two times a day (BID) | ORAL | Status: DC
Start: 2012-04-09 — End: 2012-04-09
  Filled 2012-04-08 (×3): qty 2

## 2012-04-08 MED ORDER — CEFAZOLIN SODIUM-DEXTROSE 2-3 GM-% IV SOLR
2.0000 g | Freq: Once | INTRAVENOUS | Status: AC
  Administered 2012-04-08: 1 g via INTRAVENOUS

## 2012-04-08 MED ORDER — DIAZEPAM 2 MG PO TABS: 2.0000 mg | ORAL_TABLET | ORAL | Status: DC | PRN

## 2012-04-08 MED ORDER — IOHEXOL 300 MG/ML  SOLN
150.0000 mL | Freq: Once | INTRAMUSCULAR | Status: AC | PRN
  Administered 2012-04-08: 100 mL via INTRAVENOUS

## 2012-04-08 MED ORDER — LORATADINE 10 MG PO TABS
10.0000 mg | ORAL_TABLET | Freq: Every day | ORAL | Status: DC
  Administered 2012-04-08 – 2012-04-10 (×3): 10 mg via ORAL
  Filled 2012-04-08 (×3): qty 1

## 2012-04-08 MED ORDER — ROCURONIUM BROMIDE 100 MG/10ML IV SOLN
INTRAVENOUS | Status: DC | PRN
  Administered 2012-04-08: 5 mg via INTRAVENOUS
  Administered 2012-04-08: 10 mg via INTRAVENOUS
  Administered 2012-04-08: 5 mg via INTRAVENOUS
  Administered 2012-04-08: 20 mg via INTRAVENOUS
  Administered 2012-04-08: 40 mg via INTRAVENOUS

## 2012-04-08 MED ORDER — LORAZEPAM 1 MG PO TABS: 2.0000 mg | ORAL_TABLET | Freq: Three times a day (TID) | ORAL | Status: DC | PRN

## 2012-04-08 MED ORDER — LORAZEPAM 2 MG/ML PO CONC
2.0000 mg | Freq: Three times a day (TID) | ORAL | Status: DC | PRN
Start: 2012-04-08 — End: 2012-04-08

## 2012-04-08 MED ORDER — LORAZEPAM 2 MG/ML IJ SOLN
INTRAMUSCULAR | Status: AC
  Filled 2012-04-08: qty 1

## 2012-04-08 MED ORDER — LACOSAMIDE 50 MG PO TABS
50.0000 mg | ORAL_TABLET | Freq: Three times a day (TID) | ORAL | Status: AC
  Administered 2012-04-08 – 2012-04-10 (×6): 50 mg via ORAL
  Filled 2012-04-08 (×7): qty 1

## 2012-04-08 MED ORDER — DEXTROSE 5 % IV SOLN
INTRAVENOUS | Status: DC | PRN
  Administered 2012-04-08: 12:00:00 via INTRAVENOUS

## 2012-04-08 MED ORDER — ONDANSETRON HCL 4 MG/2ML IJ SOLN
INTRAMUSCULAR | Status: DC | PRN
  Administered 2012-04-08: 4 mg via INTRAVENOUS

## 2012-04-08 MED ORDER — LORAZEPAM 2 MG/ML IJ SOLN
2.0000 mg | Freq: Once | INTRAMUSCULAR | Status: DC
  Administered 2012-04-08: 2 mg via INTRAVENOUS

## 2012-04-08 MED ORDER — LIDOCAINE HCL (CARDIAC) 20 MG/ML IV SOLN
INTRAVENOUS | Status: DC | PRN
  Administered 2012-04-08: 20 mg via INTRAVENOUS

## 2012-04-08 MED ORDER — GLYCOPYRROLATE 0.2 MG/ML IJ SOLN
INTRAMUSCULAR | Status: DC | PRN
  Administered 2012-04-08: 0.6 mg via INTRAVENOUS
  Administered 2012-04-08: 0.1 mg via INTRAVENOUS

## 2012-04-08 MED ORDER — LACTATED RINGERS IV SOLN
INTRAVENOUS | Status: DC | PRN
  Administered 2012-04-08 (×3): via INTRAVENOUS

## 2012-04-08 MED ORDER — NEOSTIGMINE METHYLSULFATE 1 MG/ML IJ SOLN
INTRAMUSCULAR | Status: DC | PRN
  Administered 2012-04-08: 4 mg via INTRAVENOUS

## 2012-04-08 MED ORDER — POTASSIUM CHLORIDE CRYS ER 20 MEQ PO TBCR
20.0000 meq | EXTENDED_RELEASE_TABLET | Freq: Four times a day (QID) | ORAL | Status: DC
  Administered 2012-04-08 – 2012-04-10 (×7): 20 meq via ORAL
  Filled 2012-04-08 (×11): qty 1

## 2012-04-08 NOTE — Progress Notes (Signed)
keving bruning, pa and dr watts here to see patient, consent signed.

## 2012-04-08 NOTE — Progress Notes (Signed)
[name], is a 26 y.o. male,   MRN: [id number]  -  DOB - 1986-06-28  Outpatient Primary MD for the patient is Eartha Inch, MD Neurologist:  Dr. Ellison Carwin Cardiologist:  Patient had an upcoming appointment schedule with Dr.   Antony Contras Complaint:   Afib while under anesthesia today.   Blood pressure 110/51, pulse 86, temperature 97.7 F (36.5 C), temperature source Oral, resp. rate 23, SpO2 100.00%.  Active Problems:  SEIZURE DISORDER  Irregular heart beat  Patient developed Afib while under anesthesia today.  IR (Dr. Grace Isaac) was doing a procedure to open up a clogged Right Port A Cath.   Of note, the patient seized before, during and after the procedure.  Dr. Sharene Skeans is aware.  The patient had a previously scheduled op appointment with [name] to evaluate arrhythmias.  We have requested an inpatient admission to [facility] (Cardiac [facility] Neuro) and a [facility] consultation.     Algis Downs, PA-C [facility] Pager: [phone]

## 2012-04-08 NOTE — Transfer of Care (Signed)
Immediate Anesthesia Transfer of Care Note  Patient: [name]  Procedure(s) Performed: Procedure(s) (LRB): RADIOLOGY WITH ANESTHESIA (N/A)  Patient Location: PACU  Anesthesia Type: General  Level of Consciousness: awake, alert  and patient cooperative  Airway & Oxygen Therapy: Patient Spontanous Breathing, Patient connected to nasal cannula oxygen and Pt had some posturing and gutteral vocalizations about 5 minutes after arriving in PACU.  Spontaneous resolution.  Answering questions after.  Breathing well.  Post-op Assessment: Report given to PACU RN, Post -op Vital signs reviewed and stable and Patient moving all extremities  Post vital signs: Reviewed and stable  Complications: No apparent anesthesia complications

## 2012-04-08 NOTE — Anesthesia Preprocedure Evaluation (Addendum)
Anesthesia Evaluation  Patient identified by MRN, date of birth, ID band Patient awake    Reviewed: Allergy & Precautions, H&P , NPO status , Patient's Chart, lab work & pertinent test results, reviewed documented beta blocker date and time   Airway Mallampati: I TM Distance: >3 FB Neck ROM: Full    Dental  (+) Teeth Intact and Dental Advisory Given   Pulmonary sleep apnea ,  breath sounds clear to auscultation  Pulmonary exam normal       Cardiovascular Rate:Normal     Neuro/Psych Seizures -, Poorly Controlled,     GI/Hepatic negative GI ROS, Neg liver ROS,   Endo/Other  Hypothyroidism   Renal/GU negative Renal ROS     Musculoskeletal   Abdominal   Peds  Hematology   Anesthesia Other Findings Receding chin VRS small mandible..the patient was on growth hormone 5 yrs during childhood.Marland KitchenMarland KitchenHas seizure hx since age 3 a few months after starting the [facility].  Reproductive/Obstetrics                        Anesthesia Physical Anesthesia Plan  ASA: [name]  Anesthesia Plan: General   Post-op Pain Management:    Induction:   Airway Management Planned: Oral ETT  Additional Equipment:   Intra-op Plan:   Post-operative Plan: Extubation in OR  Informed Consent: I have reviewed the patients History and Physical, chart, labs and discussed the procedure including the risks, benefits and alternatives for the proposed anesthesia with the patient or authorized representative who has indicated his/her understanding and acceptance.   Dental advisory given  Plan Discussed with: CRNA, Anesthesiologist and Surgeon  Anesthesia Plan Comments:         Anesthesia Quick Evaluation

## 2012-04-08 NOTE — Progress Notes (Signed)
Patient required and given 2mg  ativan IV after it was charted in the pxyis that it was wasted.  Witnessed with Sammuel Cooper RN

## 2012-04-08 NOTE — Anesthesia Postprocedure Evaluation (Signed)
Anesthesia Post Note  Patient: [name]  Procedure(s) Performed: Procedure(s) (LRB): RADIOLOGY WITH ANESTHESIA (N/A)  Anesthesia type: general  Patient location: PACU  Post pain: Pain level controlled  Post assessment: Patient's Cardiovascular Status Stable  Last Vitals:  Filed Vitals:   04/08/12 1542  BP:   Pulse: 90  Temp:   Resp: 19    Post vital signs: Reviewed and stable  Level of consciousness: sedated  Complications: No apparent anesthesia complications

## 2012-04-08 NOTE — Consult Note (Signed)
Reason for Consult: Seizure   Referring Physician: Dr. Lonia Blood  CC: seizure  HPI: [name] is an 26 y.o. male with long term seizure disorder was taken to OR today for port-a-cath replacement. He is seen by Dr. Sharene Skeans for neurology. During and post op he demonstrated recurrent seizure activity. Since surgery today he has had approximately 3 "episodes" as per father. He describes his seizures as a "clamping" and bending type fetal position. He says that he rolls him to his side to open his airway.  At home he is on tegretol, Vimpat, Keppra, topamax. He has the IV access that was initially used as a child for electrolyte imbalances; however, his father states that now it is used more for seizure control (phenobarbital). He does use ativan, valium at home as needed for seizure control.  Patient is groggy from medications, but is able to communicate with his father.  His only IV access now is left arm. NO [name] WAS REPLACED.  Past Medical History  Diagnosis Date  . ALLERGIC RHINITIS 12/26/2009  . MITRAL VALVE PROLAPSE 06/22/2007  . SEIZURE DISORDER 06/22/2007  . Unspecified hypothyroidism 06/22/2007  . Growth disorder   . Mental retardation, mild (I.Q. 50-70)   . Brown's syndrome   . Irregular heart beat   . Heart murmur     VSD  . Brown's syndrome, bilateral   . Peripheral vascular disease   . Coronary artery disease     mitral valve prolapse  . Sleep apnea     cpap , last sleep study 10/01/11    Past Surgical History  Procedure Date  . Implantation vagal nerve stimulator   . Portacath placement   . Eye surgery     X2 for Brown Syndrome    Family History  Problem Relation Age of Onset  . Breast cancer Mother   . Asthma Father     Social History:  reports that he has never smoked. He has never used smokeless tobacco. He reports that he does not drink alcohol or use illicit drugs.  Allergies  Allergen Reactions  . Divalproex Sodium Other (See Comments)   Nausea, vomiting, liver and kidney dysfunction  . Phenytoin Rash    Hypersensitivity  reaction  . Vancomycin Rash    Redman's Syndrome    Medications:  Prior to Admission:  Prescriptions prior to admission  Medication Sig Dispense Refill  . carbamazepine (TEGRETOL) 100 MG chewable tablet Chew 250 mg by mouth 3 (three) times daily. Take at 8am, 1:30pm, and 9pm.      . cetirizine (ZYRTEC) 10 MG tablet Take 10 mg by mouth every morning.      . diazepam (VALIUM) 2 MG tablet Take 2 mg by mouth as needed. Take as needed when having sensation seizure.       . Diazepam 10 MG/2ML DEVI Inject 10 mg into the muscle daily as needed. For seizures.      . Lacosamide (VIMPAT) 10 MG/ML SOLN Take 5 mLs by mouth 3 (three) times daily.      Marland Kitchen levETIRAcetam (KEPPRA) 500 MG tablet Take 1,500 mg by mouth 2 (two) times daily.      Marland Kitchen levothyroxine (SYNTHROID, LEVOTHROID) 50 MCG tablet Take 50 mcg by mouth daily.        . potassium chloride SA (K-DUR,KLOR-CON) 20 MEQ tablet Take 20 mEq by mouth 4 (four) times daily.      . potassium phosphate, monobasic, (K-PHOS) 500 MG tablet Take 500 mg by mouth 4 (four) times  daily. Take at 7a, 130p, 5p, and 9p      . topiramate (TOPAMAX) 100 MG tablet Take 150 mg by mouth 3 (three) times daily. Take at 8am, 1:30pm, and 9pm.      . warfarin (COUMADIN) 5 MG tablet Take 5 mg by mouth daily.      Marland Kitchen warfarin (COUMADIN) 7.5 MG tablet Take 7.5 mg by mouth daily.      Marland Kitchen LORazepam (ATIVAN) 2 MG/ML concentrated solution Take 2 mg by mouth every 8 (eight) hours as needed. For seizures or anxiety.      . phenobarbital (LUMINAL) 60 MG/ML injection Inject 65 mg into the vein daily as needed. For seizures       Scheduled:   . carbamazepine  250 mg Oral TID  . ceFAZolin      .  ceFAZolin (ANCEF) IV  2 g Intravenous Once  . enoxaparin (LOVENOX) injection  40 mg Subcutaneous Q24H  . lacosamide  50 mg Oral TID  . levetiracetam  1,500 mg Intravenous NOW  . levETIRAcetam  1,500 mg Oral BID    . levothyroxine  50 mcg Oral Daily  . loratadine  10 mg Oral Daily  . LORazepam      . potassium chloride SA  20 mEq Oral QID  . potassium phosphate (monobasic)  500 mg Oral QID  . sodium chloride  3 mL Intravenous Q12H  . topiramate  150 mg Oral TID  . DISCONTD: sodium chloride   Intravenous Once  . DISCONTD:  ceFAZolin (ANCEF) IV  1 g Intravenous Once  . DISCONTD: Lacosamide  5 mL Oral TID  . DISCONTD: Lacosamide  5 mL Oral TID  . DISCONTD: LORazepam  2 mg Intravenous Once   Continuous:   . sodium chloride 50 mL/hr at 04/08/12 1903  . DISCONTD: sodium chloride      ROS: History obtained from chart review  General ROS: negative for - chills, fatigue, fever, night sweats, weight gain or weight loss Psychological ROS: negative for - behavioral disorder, hallucinations, memory difficulties, mood swings or suicidal ideation Ophthalmic ROS: negative for - blurry vision, double vision, eye pain or loss of vision ENT ROS: negative for - epistaxis, nasal discharge, oral lesions, sore throat, tinnitus or vertigo Allergy and Immunology ROS: negative for - hives or itchy/watery eyes Hematological and Lymphatic ROS: negative for - bleeding problems, bruising or swollen lymph nodes Endocrine ROS: negative for - galactorrhea, hair pattern changes, polydipsia/polyuria or temperature intolerance Respiratory ROS: negative for - cough, hemoptysis, shortness of breath or wheezing Cardiovascular ROS: negative for - chest pain, dyspnea on exertion, edema or irregular heartbeat Gastrointestinal ROS: negative for - abdominal pain, diarrhea, hematemesis, nausea/vomiting or stool incontinence Genito-Urinary ROS: negative for - dysuria, hematuria, incontinence or urinary frequency/urgency Musculoskeletal ROS: negative for - joint swelling or muscular weakness Neurological ROS: as noted in HPI Dermatological ROS: negative for rash and skin lesion changes   Physical Examination: Blood pressure 129/66,  pulse 99, temperature 97.5 F (36.4 C), temperature source Oral, resp. rate 24, SpO2 100.00%.  Neurologic Examination Mental Status: Groggy (ativan) but alert enough to communicate to father. Oriented when examined in PACU. Speech slow but not aphasic.  When brought to neuro ICU, patient began intermittent but increasingly frequent seizure activity as described below. Cranial Nerves: II: visual fields grossly normal, pupils equal, round, reactive to light and accommodation [name],IV, VI: ptosis not present, extra-ocular motions intact bilaterally V,VII: face symmetric, unable to test. VIII: hearing normal bilaterally IX,X: not tested XI:  unable to test XII: unable to test Motor: Patient did have another seizure with re-examination. Extremities drawn in as fetal position. Increased tone throughout.  Sensory: Unable to test Deep Tendon Reflexes: unable to test Cerebellar: unable to test   Assessment/Plan:  26 yo with history of intractable seizures who initially presented for access placement, unable to recanalize, but has left 22 gauge access ONLY. Has known seizures, developed post op seizures and status epilepticus.  Has been administered valium and ativan with abortion of seizures. He is now being given IV Keppra to continue his maintenance medication.   Plan: Seizure disorder   1.  If further seizure activity would give Phenobarbital 60mg .  If not effective may give further doses of Keppra as well.     2.  Home AED's to be continued  A-fib -Now NSR   1.  Cardiology consult called and patient seen by Dr. Graciela Husbands   2.  Telemetry monitoring  Case discussed with Dr. Betsey Holiday Marney Doctor, MBA, [facility] Pager [phone]  04/08/2012, 6:04 PM   Patient seen and examined.  Clinical course and management discussed.  Necessary edits performed.  I agree with the above.  Thana Farr, MD [facility] [phone]  04/08/2012  7:47 PM

## 2012-04-08 NOTE — Progress Notes (Signed)
Dr. Grace Isaac has spoken with [facility]'s MD and Dr. Sharene Skeans (patient's neurologist)  in regards to patient being admitted. Orders started, bed requested.  Patient currently in PACU and now in NSR.  Found that patient had OP appointment with [facility] on 04/29/12 with Dr. Sanjuana Kava; however given that he has had cardiac changes while during procedure and under anesthesia as well as 3 seizures today - felt that patient should be seen and evaluated during admission.

## 2012-04-08 NOTE — H&P (Addendum)
[facility] History and Physical  [name] ZOX:[id number] DOB: May 12, 1986 DOA: 04/08/2012   PCP: Eartha Inch, MD   Chief Complaint: seizure during surgery  HPI:  25 year old patient with Brown's syndrome was in the operating room to get a [name] replacement. During surgery he developed atrial fibrillation with rapid ventricular response as well as generalized tonic clonic seizures. He was referred for admission. Currently the patient is awake, denies chest pain and is oriented to his father. Update at 17:32 Pm patient had another grand mal seizure   Review of Systems:  No ongoing issues with exceptions of chronic mental recommendations and chronic ongoing seizures No acute intercurrent illnesses  Past Medical History  Diagnosis Date  . ALLERGIC RHINITIS 12/26/2009  . MITRAL VALVE PROLAPSE 06/22/2007  . SEIZURE DISORDER 06/22/2007  . Unspecified hypothyroidism 06/22/2007  . Growth disorder   . Mental retardation, mild (I.Q. 50-70)   . Brown's syndrome   . Irregular heart beat   . Heart murmur     VSD  . Brown's syndrome, bilateral   . Peripheral vascular disease   . Coronary artery disease     mitral valve prolapse  . Sleep apnea     cpap , last sleep study 10/01/11   Past Surgical History  Procedure Date  . Implantation vagal nerve stimulator   . Portacath placement   . Eye surgery     X2 for Brown Syndrome   Social History:  reports that he has never smoked. He has never used smokeless tobacco. He reports that he does not drink alcohol or use illicit drugs. The patient lives at home with his parents were extremely supportive and provide 24-hour care  Patient needs assistance with IADLs but is able to feed himself dress himself  Allergies  Allergen Reactions  . Divalproex Sodium Other (See Comments)    Nausea, vomiting, liver and kidney dysfunction  . Phenytoin Rash    Hypersensitivity  reaction  . Vancomycin Rash    Redman's Syndrome    Family  History  Problem Relation Age of Onset  . Breast cancer Mother   . Asthma Father      Prior to Admission medications   Medication Sig Start Date End Date Taking? Authorizing Provider  carbamazepine (TEGRETOL) 100 MG chewable tablet Chew 250 mg by mouth 3 (three) times daily. Take at 8am, 1:30pm, and 9pm.   Yes Historical Provider, MD  cetirizine (ZYRTEC) 10 MG tablet Take 10 mg by mouth every morning.   Yes Historical Provider, MD  diazepam (VALIUM) 2 MG tablet Take 2 mg by mouth as needed. Take as needed when having sensation seizure.    Yes Historical Provider, MD  Diazepam 10 MG/2ML DEVI Inject 10 mg into the muscle daily as needed. For seizures.   Yes Historical Provider, MD  Lacosamide (VIMPAT) 10 MG/ML SOLN Take 5 mLs by mouth 3 (three) times daily.   Yes Historical Provider, MD  levETIRAcetam (KEPPRA) 500 MG tablet Take 1,500 mg by mouth 2 (two) times daily.   Yes Historical Provider, MD  levothyroxine (SYNTHROID, LEVOTHROID) 50 MCG tablet Take 50 mcg by mouth daily.     Yes Historical Provider, MD  potassium chloride SA (K-DUR,KLOR-CON) 20 MEQ tablet Take 20 mEq by mouth 4 (four) times daily.   Yes Historical Provider, MD  potassium phosphate, monobasic, (K-PHOS) 500 MG tablet Take 500 mg by mouth 4 (four) times daily. Take at 7a, 130p, 5p, and 9p   Yes Historical Provider, MD  topiramate (TOPAMAX) 100  MG tablet Take 150 mg by mouth 3 (three) times daily. Take at 8am, 1:30pm, and 9pm.   Yes Historical Provider, MD  warfarin (COUMADIN) 5 MG tablet Take 5 mg by mouth daily.   Yes Historical Provider, MD  warfarin (COUMADIN) 7.5 MG tablet Take 7.5 mg by mouth daily.   Yes Historical Provider, MD  LORazepam (ATIVAN) 2 MG/ML concentrated solution Take 2 mg by mouth every 8 (eight) hours as needed. For seizures or anxiety.    Historical Provider, MD  phenobarbital (LUMINAL) 60 MG/ML injection Inject 65 mg into the vein daily as needed. For seizures    Historical Provider, MD   Physical  Exam: Filed Vitals:   04/08/12 1641 04/08/12 1645 04/08/12 1654 04/08/12 1658  BP: 100/53  116/53   Pulse:  86  84  Temp:    97.7 F (36.5 C)  TempSrc:      Resp:  20  22  SpO2:  100%  99%     General:  Alert, following commands  Eyes: Pupil 3 mm symmetric and reactive to light  ENT: micrognathia, microcephalic  Neck: No JVD  Cardiovascular: Regular, without murmurs rubs or gallops  Respiratory: Clear to auscultation bilaterally  Abdomen: Soft nontender  Skin: Warm and dry  Musculoskeletal: Intact  Psychiatric: Unable to test  Neurologic: Too sedated to fully participate  Labs on Admission:  Everything is pending Radiological Exams on Admission: No results found.    Assessment/Plan Principal Problem:  *Generalized convulsive epilepsy without mention of intractable epilepsy Active Problems:  Unspecified hypothyroidism  SEIZURE DISORDER  Brown's syndrome  DVT of upper extremity (deep vein thrombosis)  Irregular heart beat  A-fib   1. Status epilepticus - going to 3100 - plan for neuro consult emergently  2. Breakthrough seizures in a patient with known epileptic disease. We shall resume all his home medications including Tegretol, Vimpat, Keppra, Topamax. We will continue with diazepam and Ativan as needed for breakthrough seizures. 3. Atrial fibrillation with rapid ventricular response. Currently patient is in sinus rhythm. Cardiology consultation has been called Per family request 4. Brown syndrome. Continue potassium and phosphorus replacement on a daily basis 5. Hypothyroidism - continue synthroid 6. History of upper extremity deep vein thrombosis-, patient just completed anticoagulation with Coumadin  Code Status: full Family Communication: father Disposition Plan: home    Maayan Jenning [facility] Pager [phone]  If 7PM-7AM, please contact night-coverage [email or web address] Password [facility] 04/08/2012, 5:06 PM

## 2012-04-08 NOTE — Progress Notes (Signed)
Paged Caryn Bee Allred x 4 for consent orders. Answered page but at [facility]. Pam Turbin PA paged x 2 with no answer.  Called xray and instructed to page Brayton El PA. Paged him and no response. Consent will need to be signed in xray.

## 2012-04-08 NOTE — Anesthesia Procedure Notes (Signed)
Procedure Name: Intubation Date/Time: 04/08/2012 11:43 AM Performed by: Darcey Nora B Pre-anesthesia Checklist: Patient identified, Emergency Drugs available, Suction available and Patient being monitored Patient Re-evaluated:Patient Re-evaluated prior to inductionOxygen Delivery Method: Circle system utilized Preoxygenation: Pre-oxygenation with 100% oxygen Intubation Type: IV induction Ventilation: Mask ventilation without difficulty Laryngoscope Size: Mac and 3 Grade View: Grade I Tube type: Oral Tube size: 7.5 mm Number of attempts: 1 Airway Equipment and Method: Stylet Secured at: 21 (cm at teeth) cm Tube secured with: Tape Dental Injury: Teeth and Oropharynx as per pre-operative assessment

## 2012-04-08 NOTE — Consult Note (Addendum)
Name: Janis Cuffe MRN: [id number] DOB: May 22, 1986    LOS: 0  Referring Provider:  Lavell Luster" Reason for Referral:  Seizures  PULMONARY / CRITICAL CARE MEDICINE  HPI:  This is a 26 y/o male with a lengthy seizure disorder history who was admitted on 7/18 for replacement of a permcath.  Unfortunately he developed several seizures before and after the procedure (which unfortunately was unsuccessful) and he developed atrial fibrillation.  He has been given Keppra and multiple doses of benzodiazepines and his seizures have slowed, but he is now being monitored in the ICU overnight.  Neurology and Cardiology have both been involved with his care today.  Past Medical History  Diagnosis Date  . ALLERGIC RHINITIS 12/26/2009  . MITRAL VALVE PROLAPSE 06/22/2007  . SEIZURE DISORDER 06/22/2007  . Unspecified hypothyroidism 06/22/2007  . Growth disorder   . Mental retardation, mild (I.Q. 50-70)   . Brown's syndrome   . Irregular heart beat   . Heart murmur     VSD  . Brown's syndrome, bilateral   . Peripheral vascular disease   . Coronary artery disease     mitral valve prolapse  . Sleep apnea     cpap , last sleep study 10/01/11   Past Surgical History  Procedure Date  . Implantation vagal nerve stimulator   . Portacath placement   . Eye surgery     X2 for Brown Syndrome   Prior to Admission medications   Medication Sig Start Date End Date Taking? Authorizing Provider  carbamazepine (TEGRETOL) 100 MG chewable tablet Chew 250 mg by mouth 3 (three) times daily. Take at 8am, 1:30pm, and 9pm.   Yes Historical Provider, MD  cetirizine (ZYRTEC) 10 MG tablet Take 10 mg by mouth every morning.   Yes Historical Provider, MD  diazepam (VALIUM) 2 MG tablet Take 2 mg by mouth as needed. Take as needed when having sensation seizure.    Yes Historical Provider, MD  Diazepam 10 MG/2ML DEVI Inject 10 mg into the muscle daily as needed. For seizures.   Yes Historical Provider, MD  Lacosamide  (VIMPAT) 10 MG/ML SOLN Take 5 mLs by mouth 3 (three) times daily.   Yes Historical Provider, MD  levETIRAcetam (KEPPRA) 500 MG tablet Take 1,500 mg by mouth 2 (two) times daily.   Yes Historical Provider, MD  levothyroxine (SYNTHROID, LEVOTHROID) 50 MCG tablet Take 50 mcg by mouth daily.     Yes Historical Provider, MD  potassium chloride SA (K-DUR,KLOR-CON) 20 MEQ tablet Take 20 mEq by mouth 4 (four) times daily.   Yes Historical Provider, MD  potassium phosphate, monobasic, (K-PHOS) 500 MG tablet Take 500 mg by mouth 4 (four) times daily. Take at 7a, 130p, 5p, and 9p   Yes Historical Provider, MD  topiramate (TOPAMAX) 100 MG tablet Take 150 mg by mouth 3 (three) times daily. Take at 8am, 1:30pm, and 9pm.   Yes Historical Provider, MD  warfarin (COUMADIN) 5 MG tablet Take 5 mg by mouth daily.   Yes Historical Provider, MD  warfarin (COUMADIN) 7.5 MG tablet Take 7.5 mg by mouth daily.   Yes Historical Provider, MD  LORazepam (ATIVAN) 2 MG/ML concentrated solution Take 2 mg by mouth every 8 (eight) hours as needed. For seizures or anxiety.    Historical Provider, MD  phenobarbital (LUMINAL) 60 MG/ML injection Inject 65 mg into the vein daily as needed. For seizures    Historical Provider, MD   Allergies Allergies  Allergen Reactions  . Divalproex Sodium Other (  See Comments)    Nausea, vomiting, liver and kidney dysfunction  . Phenytoin Rash    Hypersensitivity  reaction  . Vancomycin Rash    Redman's Syndrome    Family History Family History  Problem Relation Age of Onset  . Breast cancer Mother   . Asthma Father    Social History  reports that he has never smoked. He has never used smokeless tobacco. He reports that he does not drink alcohol or use illicit drugs.  Review Of Systems:  Cannot obtain due to somnolence  Brief patient description:  26 y/o male with an extensive seizure disorder history who developed seizures around the time of a port-a-cath replacement attempt.  He also  developed a-fib briefly.  He was admitted to 3100 for further management.  Events Since Admission: 7/18 port-a-cath removal  Current Status:  Vital Signs: Temp:  [97.5 F (36.4 C)-97.8 F (36.6 C)] 97.5 F (36.4 C) (07/18 1740) Pulse Rate:  [84-115] 115  (07/18 1845) Resp:  [18-29] 27  (07/18 1845) BP: (100-129)/(50-78) 110/74 mmHg (07/18 1845) SpO2:  [99 %-100 %] 100 % (07/18 1845)  Physical Examination: Gen: somnolent but arouses and follows commands HEENT: No head trauma, EOMi, OP clear, neck supple without masses PULM: CTA B CV: RRR, no mgr, no JVD AB: BS+, soft, nontender, no hsm Ext: warm, no edema, no clubbing, no cyanosis Derm: no rash or skin breakdown Neuro: somnolent but arouses, follows simple commands   Principal Problem:  *Status epilepticus Active Problems:  Unspecified hypothyroidism  SEIZURE DISORDER  Brown's syndrome  DVT of upper extremity (deep vein thrombosis)  Irregular heart beat  Generalized convulsive epilepsy without mention of intractable epilepsy  A-fib   ASSESSMENT AND PLAN  NEUROLOGIC  A:  Seizure disorder, status epilepticus; now > 1.5 hours without seizure after benzo and keppra P:   -per neurology  PULMONARY No results found for this basename: PHART:5,PCO2:5,PCO2ART:5,PO2ART:5,HCO3:5,O2SAT:5 in the last 168 hours Ventilator Settings:   CXR:   ETT:  n/a  A:  S/P multiple benzodiazepines and anti-epileptics, now protecting airway and breathing comfortably despite somnolence P:   -monitor respiratory status in ICU setting -O2 as needed -nasal trumpet while somonlent -avoid intubation (multiple predictors of a difficult airway based on anatomy)  CARDIOVASCULAR No results found for this basename: TROPONINI:5,LATICACIDVEN:5, O2SATVEN:5,PROBNP:5 in the last 168 hours ECG:  Sinus tach Lines: peripheral  A: By report he developed transient a-fib but I have no objective evidence of this on tele or EKG Central venous occlusion  prevented replacement of port-a-cath P:  -tele monitoring -per cardiology -echo in AM -cardiology and IR discussing other access options  RENAL No results found for this basename: NA:5,K:2,CL:5,CO2:5,BUN:5,CREATININE:5,CALCIUM:5,MG:5,PHOS:5 in the last 168 hours Intake/Output      07/18 0701 - 07/19 0700   P.O. 60   I.V. 1050   Total Intake 1110   Urine 800   Blood 20   Total Output 820   Net +290          BMET    Component Value Date/Time   NA 139 03/26/2012 0804   K 3.4* 03/26/2012 0804   CL 108 03/26/2012 0804   CO2 19 03/26/2012 0804   GLUCOSE 92 03/26/2012 0804   BUN 16 03/26/2012 0804   CREATININE 0.85 03/26/2012 0804   CALCIUM 8.8 03/26/2012 0804   GFRNONAA >90 03/26/2012 0804   GFRAA >90 03/26/2012 0804     Foley:    A:  Hypokalemia, chronic replacement at home P:   -  monitor UOP -home KCl and KPhos -BMET in AM  GASTROINTESTINAL No results found for this basename: AST:5,ALT:5,ALKPHOS:5,BILITOT:5,PROT:5,ALBUMIN:5 in the last 168 hours  A:  No acute issues P:   -advance diet when mental status improves  HEMATOLOGIC CBC    Component Value Date/Time   WBC 4.0 03/26/2012 0804   RBC 4.57 03/26/2012 0804   HGB 12.9* 03/26/2012 0804   HCT 38.2* 03/26/2012 0804   PLT 224 03/26/2012 0804   MCV 83.6 03/26/2012 0804   MCH 28.2 03/26/2012 0804   MCHC 33.8 03/26/2012 0804   RDW 13.5 03/26/2012 0804   LYMPHSABS 1.2 12/08/2011 1134   MONOABS 0.2 12/08/2011 1134   EOSABS 0.0 12/08/2011 1134   BASOSABS 0.0 12/08/2011 1134    A:  DVT, on warfarin, recent INR 2.4 P:  -monitor for bleeding -resume home warfarin when taking po  INFECTIOUS No results found for this basename: WBC:5,PROCALCITON:5 in the last 168 hours Cultures:  Antibiotics:   A:  No acute issues P:   -monitor glucose  ENDOCRINE No results found for this basename: GLUCAP:5 in the last 168 hours A:  Hypothyroid P:   -home synthroid when taking po    BEST PRACTICE / DISPOSITION Level of Care:  ICU Primary Service:   PCCM Consultants:  Neurology, Cardiology Code Status:  full Diet:  Advance as tolerated DVT Px:  Warfarin (resume 7/19) GI Px:  n/a Skin Integrity:  normal Social / Family:  Updated family at bedside.  CC time 45 mintues  MCQUAID, DOUGLAS, M.D. Pulmonary and Critical Care Medicine [facility] Pager: [phone]  04/08/2012, 7:36 PM

## 2012-04-08 NOTE — H&P (Signed)
Chief Complaint: Nonfunctioning right anterior chest wall port  catheter, now with central venous occlusion   History of Present illness: (Obtained from Dr. Grace Isaac office visit) This is a 26 year old male with complex medical history significant  for severe seizure disorder, mild mental retardation and Browns  syndrome.  The patient's recurrent and frequently severe seizures have been  successfully managed at home by the family with the use of a right  anterior chest wall porta catheter which was originally placed by  Dr. Levie Heritage in 2000. In the past, the family has been able to  successfully access the port and administer anti-seizure  medications avoiding repeat trips to the ED and hospitalizations  (the patient had been admitted to the hospital greater than 20  times in 1 calendar year in the past).  In September 11, 2011, the patient developed left upper arm swelling  with subsequnt ultrasound scanning demonstrating an occlusive DVT.  Since that time, the patient has been treated with Coumadin which  has resulted in resolution of the left upper extremity swelling.  In April 2013, the patient was admitted with a refractory grand mal  seizure at which time it was noted the porta-catheter was not  functioning properly. Port catheter injection and subsequent right  upper extremity venogram confirmed a fibrin sheath about the  catheter tip with central venous occlusion and markedly  hypertrophied azygos system. The porta catheter has not been used  by the family since May of this year.  The family has been able to avoid hospitalizations with IM and oral  diazepam, however admits to increased seizure activity recently  (patient experiences a baseline of two seizures per week which have  now increased to approximately three of four seizures per week).  The family does not feel they will be able to avoid repeat frequent  hospitalizations without the use of a functioning porta-catheter.  The  patient is accompanying to interventional radiology clinic by  areas father, mother and sister, his primary caregivers.   Pt now scheduled for venogram with possible intervention.  Past Medical History:  1. Seizure disorder  2. Growth disorder  3. Mild mental retardation  4. Hypothyroidism  5. Mitral valve prolapse - slight irregular heart beat.  6. Browns syndrome - eye muscle disorder   Medications:  1. Clarinex 5 mg p.o. q. day.  2. Keppra 1500 mg p.o. twice a day.  3. Klor-Con 20 meq p.o. three times a day  4. Zantac liquid at the table and three times a day.  5. Synthroid 50 mcg p.o. q. day.  6. Tegretol 250 mg p.o. three times a day.  7. Topamax 150 mg p.o. three times a day   Allergies: Denies contrast or latex allergy; Dilantin (enhances  seizures); some antihistamines can cause seizures; Depakote   Past Surgical History: 1. Implantation of porta-catheter - 2000  2. Implantation of left anterior chest wall veil nerve  stimulator - 2001   Social History: Currently in the 12th grade at [employer].  Sister [name]), Father Orvilla Fus) and Mother Samson Frederic) are the patient's  rotating caregivers. The patient is dependent for complex ADLs  (showering). The patient ambulates without assisted device.   Family History: Mother has history of pulmonary embolism;  otherwise, noncontributory.   REVIEW OF SYSTEMS (obtained from the patient's primary caregivers):  General: Denies change in appetite or energy level  Skin: Denies itch or rash.  HEENT: Baseline asymmetric left sided facial swelling which is  chronic per family report. Denies  change in vision or hearing.  Neurologic: Denies focal weakness. Baseline mild MR.  Heart: Denies chest pain or palpitations  Lungs: Denies shortness of breath, cough or hemoptysis  GI: Denies nausea or vomiting, increasing abdominal girth,  diarrhea, bloody or tarry stools.  GU: No hematuria, urinary retention or incontinence.  Musculoskeletal:  Denies joint pain.   PHYSICAL EXAMINATION:  Vital signs: Temp 97.8, HR: 89, RR: 18, BP 110/78  O2: 99%  General examination: No acute distress; follows commands, largely  nonverbal during today's interview.  Skin: No active lesions. Several small hypertrophy superficial  venous collaterals are seen about the chest, right greater than  left.  HEENT: There is mild asymmetric left sided facial swelling which  the patient states his chronic in etiology.  Lungs: Rales and wheezing within the bilateral lung bases.  Heart: Slight murmur compatible with history of mitral valve  prolapse.  Abdomen: Soft, no masses; No hepatomegaly; No guarding or rebound;  No ascites; Normal bowel sounds.  GU/Rectal: Deferred  Extremities: No asymmetric upper extremity swelling. There is mild  asymmetric fullness within the left supraclavicular fossa.  Imaging:  Port injection/right upper extremity central venogram - 01/09/2012;  chest CT - 09/13/2009  Images were personally reviewed and discussed with the patient's  family.   Assessment and Plan:  This is a 26 year old male with complex past medical history  significant for severe seizure disorder previously requiring  frequent hospitalizations, ultimately managed at home by the  patient's family with the use of a right anterior chest wall port  catheter.  The port-a-catheter is no longer functioning with recent right  upper extremity venogram and port injection demonstrating a central  venous occlusion. The patient has been on Coumadin since 08/2011  secondary to a left upper extremity DVT which has since  symptomatically resolved.  Prolonged discussions were held with the patient's family regarding  the port-a-catheter removal. Given the fact the port has been in  for greater than a decade (placed in 2000), I discussed the very  high likelihood of incomplete removal of at least a small portion  of the catheter tubing.  Prolonged discussions were  held with the patient family who  ultimately believe that a functioning catheter is mandatory to  avoid frequent repeated hospitalizations.  Given the central venous occlusion and persistent need for a porta  catheter, will attempt a port removal, central venous  recanalization with angioplasty and likely kissing bilateral  brachiocephalic stents and new port-a-catheter placement. Given  the patient's complex seizure disorder, this procedure would  require general anesthesia, and such will attempt to perform all of  the above in a single procedure.   Benefits and risks (including but not limited to incomplete porta  catheter placement, bleeding, complications from anesthesia) were  discussed with the patient and family who are in agreement with the  purposed plan of care.   Plan: Proceed with port removal, central and UE venogram, possible central venous angioplasty and stent, and hopefully placement of new portacath. Coumadin has been held X 5 days. Labs from primary office reviewed. INR: 1.14 Procedure reviewed with pt and family by myself and Dr. Grace Isaac. Consent signed in chart.  Brayton El PA-C 04/08/2012 9:24 AM

## 2012-04-08 NOTE — Progress Notes (Signed)
Just getting ready to transport pt to 3W and he had a seizure, not breathing, nasal airway placed and pt bagged with 100% o2 for about 5 min. Father here. Dr Michelle Piper here. Pt became responsive with spont. respirations, good color and stable VS.  IV Ativan given per Dr Michelle Piper. Adolph Pollack PA Gage here and she spoke with neurologist Dr Thad Ranger, who will update Dr Sharene Skeans and someone will be here to see pt. Dr Lavera Guise also updated by phone of events and pt to go to 3100.  Will cont to monitor closely.

## 2012-04-08 NOTE — Progress Notes (Signed)
Pt had a some apparent posturing/seizure activity upon adm to PACU, and holding his breath when this occurred. o2 sat 100 %. Dr Michelle Piper notified and here at bedside. No new orders. Will cont to monitor closely. Pt is in NSR in the 80's.

## 2012-04-08 NOTE — Procedures (Signed)
Successful removal of R IJ approach port a catheter. Attempted but unsuccessful attempted recanalization of right sided central venous occlusion. Procedure complicated by development of sustained a-fib.  BP has remained stable. Otherwise, no immediate complications.

## 2012-04-08 NOTE — Consult Note (Signed)
CARDIOLOGY CONSULT NOTE   Patient ID: [name] MRN: [id number] DOB/AGE: [birth year] 25 y.o.  Admit date: 04/08/2012  Primary Physician   Eartha Inch, MD Primary Cardiologist   Scheduled to see Dr Clifton James on 04/29/2012 Reason for Consultation   PAF  [name] is a 26 y.o. male with no history of CAD.  [name] came to the hospital today to get a [name] replacement. They removed the old [name] but were not able to insert a new one because of because of right-sided central venous occlusion. He developed rapid atrial fibrillation during the procedure. In PACU, the atrial fibrillation resolved without medical intervention. At the time of evaluation, he is maintaining sinus rhythm. Pt denies chest pain or shortness of breath. He had no palpitations and no awareness that his heart rate was rapid or irregular. After the interview and exam were completed, he had a grand mal seizure. The seizure resolved with supportive care and during the seizure, he maintained sinus rhythm   Past Medical History  Diagnosis Date  . ALLERGIC RHINITIS 12/26/2009  . MITRAL VALVE PROLAPSE 06/22/2007  . SEIZURE DISORDER 06/22/2007  . Unspecified hypothyroidism 06/22/2007  . Growth disorder   . Mental retardation, mild (I.Q. 50-70)   . Brown's syndrome   . Irregular heart beat   . Heart murmur     VSD  . Brown's syndrome, bilateral   . Peripheral vascular disease   . Coronary artery disease     mitral valve prolapse  . Sleep apnea     cpap , last sleep study 10/01/11     Past Surgical History  Procedure Date  . Implantation vagal nerve stimulator   . Portacath placement   . Eye surgery     X2 for [facility] Syndrome    Allergies  Allergen Reactions  . Divalproex Sodium Other (See Comments)    Nausea, vomiting, liver and kidney dysfunction  . Phenytoin Rash    Hypersensitivity  reaction  . Vancomycin Rash    Redman's Syndrome    I have reviewed the patient's current medications    . sodium chloride   Intravenous Once  . carbamazepine  250 mg Oral TID  . ceFAZolin      .  ceFAZolin (ANCEF) IV  2 g Intravenous Once  . lacosamide  50 mg Oral TID  . levETIRAcetam  1,500 mg Oral BID  . levothyroxine  50 mcg Oral Daily  . loratadine  10 mg Oral Daily  . LORazepam      . LORazepam  2 mg Intravenous Once  . potassium chloride SA  20 mEq Oral QID  . potassium phosphate (monobasic)  500 mg Oral QID  . topiramate  150 mg Oral TID  . DISCONTD:  ceFAZolin (ANCEF) IV  1 g Intravenous Once  . DISCONTD: Lacosamide  5 mL Oral TID  . DISCONTD: Lacosamide  5 mL Oral TID      . sodium chloride     diazepam, Diazepam, droperidol, HYDROmorphone (DILAUDID) injection, LORazepam, phenobarbital  Medication Sig  carbamazepine (TEGRETOL) 100 MG chewable tablet Chew 250 mg by mouth 3 (three) times daily. Take at 8am, 1:30pm, and 9pm.  cetirizine (ZYRTEC) 10 MG tablet Take 10 mg by mouth every morning.  diazepam (VALIUM) 2 MG tablet Take 2 mg by mouth as needed. Take as needed when having sensation seizure.   Diazepam 10 MG/2ML DEVI Inject 10 mg into the muscle daily as needed. For seizures.  Lacosamide (VIMPAT) 10 MG/ML SOLN Take 5  mLs by mouth 3 (three) times daily.  levETIRAcetam (KEPPRA) 500 MG tablet Take 1,500 mg by mouth 2 (two) times daily.  levothyroxine (SYNTHROID, LEVOTHROID) 50 MCG tablet Take 50 mcg by mouth daily.    potassium chloride SA (K-DUR,KLOR-CON) 20 MEQ tablet Take 20 mEq by mouth 4 (four) times daily.  potassium phosphate, monobasic, (K-PHOS) 500 MG tablet Take 500 mg by mouth 4 (four) times daily. Take at 7a, 130p, 5p, and 9p  topiramate (TOPAMAX) 100 MG tablet Take 150 mg by mouth 3 (three) times daily. Take at 8am, 1:30pm, and 9pm.  warfarin (COUMADIN) 5 MG tablet Take 5 mg by mouth daily.  warfarin (COUMADIN) 7.5 MG tablet Take 7.5 mg by mouth daily.  LORazepam (ATIVAN) 2 MG/ML concentrated solution Take 2 mg by mouth every 8 (eight) hours as needed. For  seizures or anxiety.  phenobarbital (LUMINAL) 60 MG/ML injection Inject 65 mg into the vein daily as needed. For seizures     History   Social History  . Marital Status: Single    Spouse Name: N/A    Number of Children: N/A  . Years of Education: N/A   Occupational History  . Not on file.   Social History Main Topics  . Smoking status: Never Smoker   . Smokeless tobacco: Never Used  . Alcohol Use: No  . Drug Use: No  . Sexually Active: No   Social History Narrative  . Pt lives with parents. They have managed his seizures in the past with IV Ativan PRN thru his [name] plus maintenance meds.      Family History  Problem Relation Age of Onset  . Breast cancer Mother   . Asthma Father      ROS: He does not drive. He has no ongoing complaints of pain or problems. He has no recent illnesses, fevers or chills. He is not very active, but has no  Full 14 point review of systems complete and found to be negative unless listed above.  Physical Exam: Blood pressure 116/53, pulse 84, temperature 97.7 F (36.5 C), temperature source Oral, resp. rate 22, SpO2 99.00%.  General: Well developed, frail-appearing male, in no acute distress Head: Eyes PERRLA, No xanthomas.   Normocephalic and atraumatic, oropharynx without edema or exudate. Dentition good Lungs: few bilateral basilar rales  Heart: HRRR S1 S2, no rub/gallop, Harsh, 2-3/6 murmur at the left sternal border. pulses are 2+ both lower extrem, decreased in both upper extrem..   Neck: No carotid bruits. No lymphadenopathy.  JVD not elevated. Abdomen: Bowel sounds present, abdomen soft and non-tender without masses or hernias noted. Msk:  No spine or cva tenderness. No weakness, no joint effusions, left arm with chronic deformity. Extremities: No clubbing or cyanosis. no edema.  Neuro: Alert and oriented X 2 (name/place). No new focal deficits noted. Psych:  Good affect, responds appropriately Skin: No rashes or lesions  noted.  Labs:   Lab Results  Component Value Date   WBC 4.0 03/26/2012   HGB 12.9* 03/26/2012   HCT 38.2* 03/26/2012   MCV 83.6 03/26/2012   PLT 224 03/26/2012   Echo: 04/20/2009 Study Conclusions 1. Left ventricle: The cavity size was normal. Wall thickness was normal. Systolic function was normal. The estimated ejection fraction was in the range of 55% to 60%. Wall motion was normal; there were no regional wall motion abnormalities. 2. Mitral valve: Mild prolapse, involving the anterior leaflet. Mild to moderate regurgitation directed eccentrically and posteriorly. 3. Left atrium: The atrium was mildly  dilated.   ECG:  08-Jan-2012 14:50:14  AGE IS NOT ENTERED, ASSUMED TO BE 26 YEARS OLD FOR PURPOSE OF ECG INTERPRETATION SINUS TACHYCARDIA ~ V-rate> 99 ANTEROLATERAL INFARCT, OLD ~ Q >40mS, abnormal ST-T, V2-V6 Abnormal ECG Vent. rate 109 BPM PR interval 164 ms QRS duration 98 ms QT/QTc 316/425 ms P-R-T axes 73 78 80  Radiology:  Ir Veno/ext/bi 03/26/2012  *RADIOLOGY REPORT*  Clinical Data: Evaluate extent of known central venous occlusion secondary to chronic indwelling porta-catheter.  1.  BILATERAL UPPER EXTREMITY CENTRAL VENOGRAMS 2.  ULTRASOUND GUIDED VENIPUNCTURE BY MD  Comparison: Port injection/right upper extremity venogram - 01/09/2012; chest CT - 09/01/2011  Contrast: 60 ml Omnipaque-300  Complications: None immediate  Technique:  These were placed within the bilateral antecubital region.  A central venogram was performed with injection of contrast via peripheral IVs placed within the bilateral antecubital lesions. Images were viewed.  Secondary to suboptimal opacification of the central aspect of the left upper extremity, the decision was made to access the left brachial vein with a micropuncture kit for ideal opacification.  As such, the left upper arm was prepped with chlorhexidine in a sterile fashion, and a sterile drape was applied covering the operative field.  Local anesthesia  was provided with 1% lidocaine. Under direct ultrasound guidance, the left brachial vein was accessed with a micropuncture kit after the overlying soft tissues were anesthetized with 1% lidocaine.  An ultrasound image was saved for documentation purposes. Repeat central left upper extremity venogram was performed via the outer sheath to the micropuncture kit.  The micropuncture sheath was removed and  hemostasis achieved with manual compression.  Dressings were placed.  The patient tolerated the procedure well without immediate post procedural complication.  Findings:  Right upper extremity:  Central right upper extremity venogram is unchanged compared to prior examination. Grossly unchanged moderate to long segment irregular narrowing of the right innominate vein/central aspect the right subclavian vein with filling of multiple right axillary and supraclavicular venous collaterals and early filling of the hypertrophied azygos system.  The SVC appears patent.  The right IJ approach port catheter tip again projects over the expected location of the central aspect of the left innominate vein.  Left upper extremity:  There is long segment occlusion of the left central venous system with irregular narrowing of the peripheral aspect of the left subclavian vein and non-opacification of the mid or central subclavian vein or innominate.  This finding is associated with filling of multiple hypertrophied axillary and chest wall/intercostal vessels and opacification of the hypertrophied azygos system.  Impression:  1.  Bilateral upper extremity central venous occlusion, left greater than right, as detailed above.  2.  Malpositioned port-a-cath tip, overlying the expected location of the central aspect of the left innominate vein.  Original Report Authenticated By: Waynard Reeds, M.D.   Ir US Guide Vasc Access Left 03/26/2012  *RADIOLOGY REPORT*  Clinical Data: Evaluate extent of known central venous occlusion secondary to  chronic indwelling porta-catheter.  1.  BILATERAL UPPER EXTREMITY CENTRAL VENOGRAMS 2.  ULTRASOUND GUIDED VENIPUNCTURE BY MD  Comparison: Port injection/right upper extremity venogram - 01/09/2012; chest CT - 09/01/2011  Contrast: 60 ml Omnipaque-300  Complications: None immediate  Technique:  These were placed within the bilateral antecubital region.  A central venogram was performed with injection of contrast via peripheral IVs placed within the bilateral antecubital lesions. Images were viewed.  Secondary to suboptimal opacification of the central aspect of the left upper extremity, the decision  was made to access the left brachial vein with a micropuncture kit for ideal opacification.  As such, the left upper arm was prepped with chlorhexidine in a sterile fashion, and a sterile drape was applied covering the operative field.  Local anesthesia was provided with 1% lidocaine. Under direct ultrasound guidance, the left brachial vein was accessed with a micropuncture kit after the overlying soft tissues were anesthetized with 1% lidocaine.  An ultrasound image was saved for documentation purposes. Repeat central left upper extremity venogram was performed via the outer sheath to the micropuncture kit.  The micropuncture sheath was removed and  hemostasis achieved with manual compression.  Dressings were placed.  The patient tolerated the procedure well without immediate post procedural complication.  Findings:  Right upper extremity:  Central right upper extremity venogram is unchanged compared to prior examination. Grossly unchanged moderate to long segment irregular narrowing of the right innominate vein/central aspect the right subclavian vein with filling of multiple right axillary and supraclavicular venous collaterals and early filling of the hypertrophied azygos system.  The SVC appears patent.  The right IJ approach port catheter tip again projects over the expected location of the central aspect of the  left innominate vein.  Left upper extremity:  There is long segment occlusion of the left central venous system with irregular narrowing of the peripheral aspect of the left subclavian vein and non-opacification of the mid or central subclavian vein or innominate.  This finding is associated with filling of multiple hypertrophied axillary and chest wall/intercostal vessels and opacification of the hypertrophied azygos system.  Impression:  1.  Bilateral upper extremity central venous occlusion, left greater than right, as detailed above.  2.  Malpositioned port-a-cath tip, overlying the expected location of the central aspect of the left innominate vein.  Original Report Authenticated By: Waynard Reeds, M.D.      ASSESSMENT AND PLAN:   The patient was seen today by Dr Graciela Husbands, the patient evaluated and the data reviewed.  1. Atrial fib - transient and self-limiting. Will check TSH and recommend continuing telemetry. If he has no further episodes, MD advise on outpatient monitor. Will ck echo and recommend he keep the 8/8 appt with Dr Clifton James. No need for chronic anticoagulation at this time.  Otherwise, per primary MD/Neurology/IR Principal Problem:  *Generalized convulsive epilepsy without mention of intractable epilepsy Active Problems:  Unspecified hypothyroidism  SEIZURE DISORDER  Brown's syndrome  DVT of upper extremity (deep vein thrombosis)  Irregular heart beat  A-fib   Signed: Theodore Demark 04/08/2012, 5:22 PM Co-Sign MD   PT with post op afib  Now resolved;  He has been having multiple seizures today adn i dont know how this might be contributing. His chart describes a VSD but the echo did not show that, but it may not have been looked at;  He has LAE which may be contributing to his tendency to afib  The other issue is central access. There was a paper in the pacer literature aabout using the Brockenbraugh needle to create an inside -out access into the right subclavian vein  which then allows a wire to serve to then antegrade deploy a pacemaker sheath, and potentially a port-a cath to the upperextremity central circulation  I have left message with Dr Elmon Kirschner to offer the idea  Repeat echo

## 2012-04-08 NOTE — Preoperative (Signed)
Beta Blockers   Reason not to administer Beta Blockers:Not Applicable 

## 2012-04-09 ENCOUNTER — Encounter (HOSPITAL_COMMUNITY): Payer: Self-pay | Admitting: Pediatrics

## 2012-04-09 DIAGNOSIS — I4891 Unspecified atrial fibrillation: Secondary | ICD-10-CM

## 2012-04-09 DIAGNOSIS — I059 Rheumatic mitral valve disease, unspecified: Secondary | ICD-10-CM

## 2012-04-09 LAB — BASIC METABOLIC PANEL
BUN: 7 mg/dL (ref 6–23)
Chloride: 107 mEq/L (ref 96–112)
GFR calc Af Amer: >90 mL/min (ref >90)
Potassium: 3.1 mEq/L — ABNORMAL LOW (ref 3.5–5.1)
Sodium: 138 mEq/L (ref 135–145)

## 2012-04-09 LAB — PROTIME-INR: INR: 1.07 (ref 0.00–1.49)

## 2012-04-09 LAB — TSH: TSH: 0.549 u[IU]/mL (ref 0.350–4.500)

## 2012-04-09 MED ORDER — SODIUM CHLORIDE 0.9 % IV SOLN
1500.0000 mg | Freq: Once | INTRAVENOUS | Status: AC
  Administered 2012-04-09: 1500 mg via INTRAVENOUS
  Filled 2012-04-09 (×2): qty 15

## 2012-04-09 MED ORDER — LEVETIRACETAM 750 MG PO TABS
1500.0000 mg | ORAL_TABLET | Freq: Two times a day (BID) | ORAL | Status: DC
  Administered 2012-04-09 – 2012-04-10 (×2): 1500 mg via ORAL
  Filled 2012-04-09 (×3): qty 2

## 2012-04-09 MED FILL — Lorazepam Inj 2 MG/ML: INTRAMUSCULAR | Qty: 2 | Status: AC

## 2012-04-09 NOTE — Progress Notes (Signed)
Dr. Grace Isaac and cardiology (Dr. Clide Cliff) as well as Dr. Sharene Skeans have been involved in several discussions today in future planning for this patient.  It was suggested a combination procedure with IR and cardiology - however given the severity of his central occlusion - it has been deemed that the patient is not a candidate for this procedure.  Dr. Sharene Skeans prefers if possible avoiding femoral port placement given that they are not as durable in this area and has a higher infection risk.  Dr. Grace Isaac has spoke with [facility] today - they may have additional options for this patient that may be worth looking into.  IR unfortunately is unable to offer anything further on this patient at this time with the exception of placing a femoral port if found there are no other options for this patient.  Patient continues to remain in NICU and having seizures this am.  Appears he will need to remain until these are better controlled. IR available as needed. We appreciate the opportunity to assist in the care of this patient and appreciate as well as the other services who are assisting with his management.

## 2012-04-09 NOTE — Progress Notes (Signed)
  Echocardiogram 2D Echocardiogram has been performed.  Georgian Co 04/09/2012, 2:44 PM

## 2012-04-09 NOTE — Progress Notes (Signed)
Subjective: Per report of family patient has had 5 seizures this morning.  When I enter the room he is ending with one seizure and before I leave the room he is initiating another.    Objective: Current vital signs: BP 102/60  Pulse 109  Temp 98.6 F (37 C) (Oral)  Resp 28  Ht 5\' 3"  (1.6 m)  Wt 44.997 kg (99 lb 3.2 oz)  BMI 17.57 kg/m2  SpO2 100% Vital signs in last 24 hours: Temp:  [97.5 F (36.4 C)-98.6 F (37 C)] 98.6 F (37 C) (07/19 0700) Pulse Rate:  [71-115] 109  (07/19 0700) Resp:  [19-49] 28  (07/19 0700) BP: (78-129)/(34-74) 102/60 mmHg (07/19 0700) SpO2:  [91 %-100 %] 100 % (07/19 0700) Weight:  [44.997 kg (99 lb 3.2 oz)] 44.997 kg (99 lb 3.2 oz) (07/19 0500)  Intake/Output from previous day: 07/18 0701 - 07/19 0700 In: 1950 [P.O.:300; I.V.:1650] Out: 1520 [Urine:1500; Blood:20] Intake/Output this shift:   Nutritional status: General  Neurologic Exam: Mental Status: Patient verbalizing to family at one point.  Patient then turn head to the right and tonic activity begins.   Motor: Generalized tonic activity Cerebellar: Unable to perform  Lab Results: Results for orders placed during the hospital encounter of 04/08/12 (from the past 48 hour(s))  BASIC METABOLIC PANEL     Status: Abnormal   Collection Time   04/08/12  9:01 PM      Component Value Range Comment   Sodium 136  135 - 145 mEq/L    Potassium 3.1 (*) 3.5 - 5.1 mEq/L    Chloride 105  96 - 112 mEq/L    CO2 19  19 - 32 mEq/L    Glucose, Bld 105 (*) 70 - 99 mg/dL    BUN 7  6 - 23 mg/dL    Creatinine, Ser 1.61  0.50 - 1.35 mg/dL    Calcium 8.4  8.4 - 09.6 mg/dL    GFR calc non Af Amer >90  >90 mL/min    GFR calc Af Amer >90  >90 mL/min   CARDIAC PANEL(CRET KIN+CKTOT+MB+TROPI)     Status: Normal   Collection Time   04/08/12  9:01 PM      Component Value Range Comment   Total CK 187  7 - 232 U/L    CK, MB 1.9  0.3 - 4.0 ng/mL    Troponin I <0.30  <0.30 ng/mL    Relative Index 1.0  0.0 - 2.5     BASIC METABOLIC PANEL     Status: Abnormal   Collection Time   04/09/12  5:15 AM      Component Value Range Comment   Sodium 138  135 - 145 mEq/L    Potassium 3.1 (*) 3.5 - 5.1 mEq/L    Chloride 107  96 - 112 mEq/L    CO2 19  19 - 32 mEq/L    Glucose, Bld 100 (*) 70 - 99 mg/dL    BUN 7  6 - 23 mg/dL    Creatinine, Ser 0.45  0.50 - 1.35 mg/dL    Calcium 8.2 (*) 8.4 - 10.5 mg/dL    GFR calc non Af Amer >90  >90 mL/min    GFR calc Af Amer >90  >90 mL/min   PROTIME-INR     Status: Normal   Collection Time   04/09/12  5:15 AM      Component Value Range Comment   Prothrombin Time 14.1  11.6 - 15.2 seconds  INR 1.07  0.00 - 1.49     No results found for this or any previous visit (from the past 240 hour(s)).  Lipid Panel No results found for this basename: CHOL,TRIG,HDL,CHOLHDL,VLDL,LDLCALC in the last 72 hours  Studies/Results: Ir Veno/ext/uni Right  04/09/2012  *RADIOLOGY REPORT*  Indication: History of severe seizure disorder necessitating chronic port catheter placement for intravenous administration of seizure medications.  Currently with malfunctioning port catheter and severe bilateral central venous occlusion.  1.  FLUOROSCOPIC GUIDED PORT-A-CATHTER REMOVAL 2.  FLUOROSCOPIC GUIDED ATTEMPTED RIGHT INOMINATE VEIN RECANALIZATION 3.  ULTRASOUND GUIDANCE FOR VENOUS ACCESS x2  Comparisons: Bilateral upper extremity central venogram - 03/26/2012; right upper central venogram and port catheter injection - 01/09/2012; chest CT - 09/01/2011; 09/13/2009  Intravenous Medications: General anesthesia; Ancef 1 gram IV. Antibiotic administered within 1 hour of incision.  Contrast: 100 ml Omnipaque-300  Fluoroscopy Time: 29.8 minutes.  Complications: The patient developed sustained atrial fibrillation during the procedure.  Technique:  Informed written consent was obtained from the patient's family after a discussion of the risks, benefits and alternatives to treatment.  Questions regarding the  procedure were encouraged and answered.  A timeout was performed prior to the initiation of the procedure.  The skin overlying the right upper chest, neck, right groin and medial aspect of the right upper arm was prepped and draped in the usual sterile fashion, and a sterile drape was applied covering the operative field.  Maximum barrier sterile technique with sterile gowns and gloves were used for the procedure.  A timeout was performed prior to the initiation of the procedure.  Local anesthesia was provided with 1% lidocaine.  Attention was first paid to worse removal of the malfunctioning port catheter.  An incision was made overlying the [name] with a #15 scalpel.  Utilizing sharp and blunt dissection, the Port-A- Cath reservoir was removed. The catheter tubing was coughed and cannulated with a stiff Glidewire.  Efforts were made to advance a stiff Glidewire passed the chronic occlusion of the distal end of the tip of the porta catheter, however this ultimately proved unsuccessful. The pocket was irrigated with sterile saline.  A white block was placed within the reservoir pocket and the incision was covered with sterile towels.  Attention was palpated towards recanalization of the known right upper extremity central venous occlusion.  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 A small channel within the chronically occluded right internal jugular vein was accessed with a micropuncture kit however despite prolonged efforts, a micro wire could not be advanced through the chronic occlusion.  Attempts were then made to cross the chronic occlusion from a caudal approach.  The right femoral head with mark fluoroscopically.  Under direct ultrasound guidance, the right common femoral vein was accessed with a micropuncture kit at the overlying soft tissues were anesthetized with 1% lidocaine. An ultrasound image was saved for documentation purposes.  This allowed for  placement of a 5-French vascular sheath.  Efforts were made to cross the right innominate occlusion from this caudal approach with the use of a Kumpe catheter and stiff Glidewire, however this ultimately proved unsuccessful.  Attempts were then made to cross the chronic occlusion from a right upper extremity approach.  Under direct ultrasound guidance, the right basilic vein was accessed with a micropuncture kit after the overlying soft tissues were anesthetized with 1% lidocaine.  An ultrasound image was saved for documentation purposes. This allowed for placement of a 6-French vascular sheath.  With the  use of both a Kumpe and 4-French angled glide catheter, prolonged efforts were made to cross the central venous occlusion via a dominant hypertrophied mediastinal collateral. Multiple selective central venograms were performed.  Given the abrupt angulation and tortuosity of the collaterals origin, a 45 degree GT Glidewire as well as a Transcend microwire were utilized to advance a regular Renegade microcatheter into the proximal aspect of the collateral. With this collateral now successfully cannulated, contrast injection failed to demonstrate definite communication with the SVC. As such the procedure was terminated.  All wires catheters and sheaths were removed from the patient.  Attention was now paid towards closure of the port-a-catheter reservoir pocket.  The pocked was irrigated with sterile saline. Wound closure was performed with subcutaneous 3-0 Monocryl, subcuticular 4-0 Vicryl and Dermabond.  Hemostasis was achieved at the right arm and groin access site with manual compression. Dressings were placed.  Dressings were placed.  The patient developed sustained atrial fibrillation during the procedure, however otherwise tolerated the procedure well without immediate postprocedural complication.  Findings:  Successful removal of implanted [name].  Despite prolonged efforts including attempted  recanalization of chronically occluded right internal jugular vein and attempted recanalization of chronically occluded right innominate vein both from a caudal as well from a right upper extremity approach, the known central venous occlusion was unable to be traversed. 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  Impression:  1.  Successful fluoroscopic removal of implanted porta-catheter. 2.  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 3.  Procedure complicated by patient's development sustained atrial fibrillation.  As such, the patient will be admitted to the telemetry unit and will obtain a cardiology consult.  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 At this time, the family wishes to monitor the patient's seizure activity during the coming months to determine the necessity of a new port-a-catheter placement.  Above findings discussed with Dr. Sharene Skeans at the time of procedure completion.  Original Report Authenticated By: Waynard Reeds, M.D.   [facility]  04/09/2012  *RADIOLOGY REPORT*  Indication: History of severe seizure disorder necessitating chronic port catheter placement for intravenous administration of seizure medications.  Currently with malfunctioning port catheter and severe bilateral central venous occlusion.  1.  FLUOROSCOPIC GUIDED PORT-A-CATHTER REMOVAL 2.  FLUOROSCOPIC GUIDED ATTEMPTED RIGHT INOMINATE VEIN RECANALIZATION 3.  ULTRASOUND GUIDANCE FOR VENOUS ACCESS x2  Comparisons: Bilateral upper extremity central venogram - 03/26/2012; right upper central venogram and port catheter injection - 01/09/2012; chest CT - 09/01/2011; 09/13/2009  Intravenous  Medications: General anesthesia; Ancef 1 gram IV. Antibiotic administered within 1 hour of incision.  Contrast: 100 ml Omnipaque-300  Fluoroscopy Time: 29.8 minutes.  Complications: The patient developed sustained atrial fibrillation during the procedure.  Technique:  Informed written consent was obtained from the patient's family after a discussion of the risks, benefits and alternatives to treatment.  Questions regarding the procedure were encouraged and answered.  A timeout was performed prior to the initiation of the procedure.  The skin overlying the right upper chest, neck, right groin and medial aspect of the right upper arm was prepped and draped in the usual sterile fashion, and a sterile drape was applied covering the operative field.  Maximum barrier sterile technique with sterile gowns and gloves were  used for the procedure.  A timeout was performed prior to the initiation of the procedure.  Local anesthesia was provided with 1% lidocaine.  Attention was first paid to worse removal of the malfunctioning port catheter.  An incision was made overlying the [name] with a #15 scalpel.  Utilizing sharp and blunt dissection, the Port-A- Cath reservoir was removed. The catheter tubing was coughed and cannulated with a stiff Glidewire.  Efforts were made to advance a stiff Glidewire passed the chronic occlusion of the distal end of the tip of the porta catheter, however this ultimately proved unsuccessful. The pocket was irrigated with sterile saline.  A white block was placed within the reservoir pocket and the incision was covered with sterile towels.  Attention was palpated towards recanalization of the known right upper extremity central venous occlusion.  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 A small channel within the chronically occluded right internal jugular vein was accessed with a micropuncture kit however despite prolonged efforts, a micro wire could  not be advanced through the chronic occlusion.  Attempts were then made to cross the chronic occlusion from a caudal approach.  The right femoral head with mark fluoroscopically.  Under direct ultrasound guidance, the right common femoral vein was accessed with a micropuncture kit at the overlying soft tissues were anesthetized with 1% lidocaine. An ultrasound image was saved for documentation purposes.  This allowed for placement of a 5-French vascular sheath.  Efforts were made to cross the right innominate occlusion from this caudal approach with the use of a Kumpe catheter and stiff Glidewire, however this ultimately proved unsuccessful.  Attempts were then made to cross the chronic occlusion from a right upper extremity approach.  Under direct ultrasound guidance, the right basilic vein was accessed with a micropuncture kit after the overlying soft tissues were anesthetized with 1% lidocaine.  An ultrasound image was saved for documentation purposes. This allowed for placement of a 6-French vascular sheath.  With the use of both a Kumpe and 4-French angled glide catheter, prolonged efforts were made to cross the central venous occlusion via a dominant hypertrophied mediastinal collateral. Multiple selective central venograms were performed.  Given the abrupt angulation and tortuosity of the collaterals origin, a 45 degree GT Glidewire as well as a Transcend microwire were utilized to advance a regular Renegade microcatheter into the proximal aspect of the collateral. With this collateral now successfully cannulated, contrast injection failed to demonstrate definite communication with the SVC. As such the procedure was terminated.  All wires catheters and sheaths were removed from the patient.  Attention was now paid towards closure of the port-a-catheter reservoir pocket.  The pocked was irrigated with sterile saline. Wound closure was performed with subcutaneous 3-0 Monocryl, subcuticular 4-0 Vicryl and  Dermabond.  Hemostasis was achieved at the right arm and groin access site with manual compression. Dressings were placed.  Dressings were placed.  The patient developed sustained atrial fibrillation during the procedure, however otherwise tolerated the procedure well without immediate postprocedural complication.  Findings:  Successful removal of implanted [name].  Despite prolonged efforts including attempted recanalization of chronically occluded right internal jugular vein and attempted recanalization of chronically occluded right innominate vein both from a caudal as well from a right upper extremity approach, the known central venous occlusion was unable to be traversed. 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  Impression:  1.  Successful fluoroscopic removal of implanted porta-catheter. 2.  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 3.  Procedure complicated by patient's development sustained atrial fibrillation.  As such, the patient will be admitted to the telemetry unit and will obtain a cardiology consult.  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 At this time, the family wishes to monitor the patient's seizure activity during the coming months to determine the necessity of a new port-a-catheter placement.  Above findings discussed with Dr. Sharene Skeans at the time of procedure completion.  Original Report Authenticated By: Waynard Reeds, M.D.   Ir US Guide Vasc Access Right  04/09/2012  *RADIOLOGY REPORT*  Indication: History of severe seizure disorder necessitating chronic port catheter placement for intravenous administration of seizure medications.  Currently  with malfunctioning port catheter and severe bilateral central venous occlusion.  1.  FLUOROSCOPIC GUIDED PORT-A-CATHTER REMOVAL 2.  FLUOROSCOPIC GUIDED ATTEMPTED RIGHT INOMINATE VEIN RECANALIZATION 3.  ULTRASOUND GUIDANCE FOR VENOUS ACCESS x2  Comparisons: Bilateral upper extremity central venogram - 03/26/2012; right upper central venogram and port catheter injection - 01/09/2012; chest CT - 09/01/2011; 09/13/2009  Intravenous Medications: General anesthesia; Ancef 1 gram IV. Antibiotic administered within 1 hour of incision.  Contrast: 100 ml Omnipaque-300  Fluoroscopy Time: 29.8 minutes.  Complications: The patient developed sustained atrial fibrillation during the procedure.  Technique:  Informed written consent was obtained from the patient's family after a discussion of the risks, benefits and alternatives to treatment.  Questions regarding the procedure were encouraged and answered.  A timeout was performed prior to the initiation of the procedure.  The skin overlying the right upper chest, neck, right groin and medial aspect of the right upper arm was prepped and draped in the usual sterile fashion, and a sterile drape was applied covering the operative field.  Maximum barrier sterile technique with sterile gowns and gloves were used for the procedure.  A timeout was performed prior to the initiation of the procedure.  Local anesthesia was provided with 1% lidocaine.  Attention was first paid to worse removal of the malfunctioning port catheter.  An incision was made overlying the [name] with a #15 scalpel.  Utilizing sharp and blunt dissection, the Port-A- Cath reservoir was removed. The catheter tubing was coughed and cannulated with a stiff Glidewire.  Efforts were made to advance a stiff Glidewire passed the chronic occlusion of the distal end of the tip of the porta catheter, however this ultimately proved unsuccessful. The pocket was irrigated with sterile saline.  A white block was placed within  the reservoir pocket and the incision was covered with sterile towels.  Attention was palpated towards recanalization of the known right upper extremity central venous occlusion.  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 A small channel within the chronically occluded right internal jugular vein was accessed with a micropuncture kit however despite prolonged efforts, a micro wire could not be advanced through the chronic occlusion.  Attempts were then made to cross the chronic occlusion from a caudal approach.  The right femoral head with mark fluoroscopically.  Under direct ultrasound guidance, the right common femoral vein was accessed with a micropuncture kit at the overlying soft tissues were anesthetized with 1% lidocaine. An ultrasound image was saved for documentation purposes.  This allowed for placement of a 5-French vascular sheath.  Efforts were made  to cross the right innominate occlusion from this caudal approach with the use of a Kumpe catheter and stiff Glidewire, however this ultimately proved unsuccessful.  Attempts were then made to cross the chronic occlusion from a right upper extremity approach.  Under direct ultrasound guidance, the right basilic vein was accessed with a micropuncture kit after the overlying soft tissues were anesthetized with 1% lidocaine.  An ultrasound image was saved for documentation purposes. This allowed for placement of a 6-French vascular sheath.  With the use of both a Kumpe and 4-French angled glide catheter, prolonged efforts were made to cross the central venous occlusion via a dominant hypertrophied mediastinal collateral. Multiple selective central venograms were performed.  Given the abrupt angulation and tortuosity of the collaterals origin, a 45 degree GT Glidewire as well as a Transcend microwire were utilized to advance a regular Renegade microcatheter into the proximal aspect of the collateral. With this  collateral now successfully cannulated, contrast injection failed to demonstrate definite communication with the SVC. As such the procedure was terminated.  All wires catheters and sheaths were removed from the patient.  Attention was now paid towards closure of the port-a-catheter reservoir pocket.  The pocked was irrigated with sterile saline. Wound closure was performed with subcutaneous 3-0 Monocryl, subcuticular 4-0 Vicryl and Dermabond.  Hemostasis was achieved at the right arm and groin access site with manual compression. Dressings were placed.  Dressings were placed.  The patient developed sustained atrial fibrillation during the procedure, however otherwise tolerated the procedure well without immediate postprocedural complication.  Findings:  Successful removal of implanted [name].  Despite prolonged efforts including attempted recanalization of chronically occluded right internal jugular vein and attempted recanalization of chronically occluded right innominate vein both from a caudal as well from a right upper extremity approach, the known central venous occlusion was unable to be traversed. 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  Impression:  1.  Successful fluoroscopic removal of implanted porta-catheter. 2.  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 3.  Procedure complicated by patient's development sustained atrial fibrillation.  As such, the patient will be admitted to the telemetry unit and will obtain a cardiology consult.  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 At this time, the family wishes to monitor the patient's seizure activity during the coming  months to determine the necessity of a new port-a-catheter placement.  Above findings discussed with Dr. Sharene Skeans at the time of procedure completion.  Original Report Authenticated By: Waynard Reeds, M.D.   Ir US Guide Vasc Access Right  04/09/2012  *RADIOLOGY REPORT*  Indication: History of severe seizure disorder necessitating chronic port catheter placement for intravenous administration of seizure medications.  Currently with malfunctioning port catheter and severe bilateral central venous occlusion.  1.  FLUOROSCOPIC GUIDED PORT-A-CATHTER REMOVAL 2.  FLUOROSCOPIC GUIDED ATTEMPTED RIGHT INOMINATE VEIN RECANALIZATION 3.  ULTRASOUND GUIDANCE FOR VENOUS ACCESS x2  Comparisons: Bilateral upper extremity central venogram - 03/26/2012; right upper central venogram and port catheter injection - 01/09/2012; chest CT - 09/01/2011; 09/13/2009  Intravenous Medications: General anesthesia; Ancef 1 gram IV. Antibiotic administered within 1 hour of incision.  Contrast: 100 ml Omnipaque-300  Fluoroscopy Time: 29.8 minutes.  Complications: The patient developed sustained atrial fibrillation during the procedure.  Technique:  Informed written consent was obtained from the patient's family after a discussion of the risks, benefits and alternatives to treatment.  Questions regarding the procedure were encouraged and answered.  A timeout was performed prior to the initiation of the procedure.  The skin overlying the right upper chest, neck, right groin and medial aspect of the right upper arm was prepped and draped in the usual sterile fashion, and a sterile drape was applied covering the operative field.  Maximum barrier sterile technique with sterile gowns and gloves were used for the procedure.  A timeout was performed prior to the initiation of the procedure.  Local anesthesia was provided with 1% lidocaine.  Attention was first paid to worse removal of the malfunctioning port catheter.  An incision was made overlying the  [name] with a #15 scalpel.  Utilizing sharp and blunt dissection, the Port-A- Cath reservoir was removed. The catheter tubing was coughed and cannulated with a stiff Glidewire.  Efforts were made to advance a stiff Glidewire passed the chronic occlusion of the distal end of the tip of the porta catheter, however this ultimately proved unsuccessful. The pocket was irrigated with sterile saline.  A white block was placed within the reservoir pocket and the incision was covered with sterile towels.  Attention was palpated towards recanalization of the known right upper extremity central venous occlusion.  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 A small channel within the chronically occluded right internal jugular vein was accessed with a micropuncture kit however despite prolonged efforts, a micro wire could not be advanced through the chronic occlusion.  Attempts were then made to cross the chronic occlusion from a caudal approach.  The right femoral head with mark fluoroscopically.  Under direct ultrasound guidance, the right common femoral vein was accessed with a micropuncture kit at the overlying soft tissues were anesthetized with 1% lidocaine. An ultrasound image was saved for documentation purposes.  This allowed for placement of a 5-French vascular sheath.  Efforts were made to cross the right innominate occlusion from this caudal approach with the use of a Kumpe catheter and stiff Glidewire, however this ultimately proved unsuccessful.  Attempts were then made to cross the chronic occlusion from a right upper extremity approach.  Under direct ultrasound guidance, the right basilic vein was accessed with a micropuncture kit after the overlying soft tissues were anesthetized with 1% lidocaine.  An ultrasound image was saved for documentation purposes. This allowed for placement of a 6-French vascular sheath.  With the use of both a Kumpe and 4-French angled  glide catheter, prolonged efforts were made to cross the central venous occlusion via a dominant hypertrophied mediastinal collateral. Multiple selective central venograms were performed.  Given the abrupt angulation and tortuosity of the collaterals origin, a 45 degree GT Glidewire as well as a Transcend microwire were utilized to advance a regular Renegade microcatheter into the proximal aspect of the collateral. With this collateral now successfully cannulated, contrast injection failed to demonstrate definite communication with the SVC. As such the procedure was terminated.  All wires catheters and sheaths were removed from the patient.  Attention was now paid towards closure of the port-a-catheter reservoir pocket.  The pocked was irrigated with sterile saline. Wound closure was performed with subcutaneous 3-0 Monocryl, subcuticular 4-0 Vicryl and Dermabond.  Hemostasis was achieved at the right arm and groin access site with manual compression. Dressings were placed.  Dressings were placed.  The patient developed sustained atrial fibrillation during the procedure,  however otherwise tolerated the procedure well without immediate postprocedural complication.  Findings:  Successful removal of implanted [name].  Despite prolonged efforts including attempted recanalization of chronically occluded right internal jugular vein and attempted recanalization of chronically occluded right innominate vein both from a caudal as well from a right upper extremity approach, the known central venous occlusion was unable to be traversed. 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  Impression:  1.  Successful fluoroscopic removal of implanted porta-catheter. 2.  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 3.  Procedure complicated by patient's  development sustained atrial fibrillation.  As such, the patient will be admitted to the telemetry unit and will obtain a cardiology consult.  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 At this time, the family wishes to monitor the patient's seizure activity during the coming months to determine the necessity of a new port-a-catheter placement.  Above findings discussed with Dr. Sharene Skeans at the time of procedure completion.  Original Report Authenticated By: Waynard Reeds, M.D.    Medications:  I have reviewed the patient's current medications. Scheduled:   . carbamazepine  250 mg Oral TID  . ceFAZolin      .  ceFAZolin (ANCEF) IV  2 g Intravenous Once  . enoxaparin (LOVENOX) injection  40 mg Subcutaneous Q24H  . lacosamide  50 mg Oral TID  . levetiracetam  1,500 mg Intravenous NOW  . levetiracetam  1,500 mg Intravenous Once  . levETIRAcetam  1,500 mg Oral BID  . levothyroxine  50 mcg Oral Daily  . loratadine  10 mg Oral Daily  . LORazepam      . potassium chloride SA  20 mEq Oral QID  . potassium phosphate (monobasic)  500 mg Oral QID  . sodium chloride  3 mL Intravenous Q12H  . topiramate  150 mg Oral TID  . DISCONTD: sodium chloride   Intravenous Once  . DISCONTD: Lacosamide  5 mL Oral TID  . DISCONTD: Lacosamide  5 mL Oral TID  . DISCONTD: levETIRAcetam  1,500 mg Oral BID  . DISCONTD: LORazepam  2 mg Intravenous Once    Assessment/Plan:  Patient Active Hospital Problem List: Status epilepticus (04/08/2012)   Assessment: Frequent seizures continue.  Access issues still to be addressed.     Plan:  1.  Diazepam 10mg  now.  Will continue to use prn if necessary.  2.  Will give this morning dose of Keppra IV and hold po dose  3.  Will attempt po administration of remaining AED's  4.  Continue seizure precautions  5.  To remain  in ICU setting due to frequent seizures.      LOS: 1 day   Thana Farr, MD [facility] [phone] 04/09/2012  8:46 AM

## 2012-04-09 NOTE — Progress Notes (Signed)
Patient Name: [name] Date of Encounter: 04/09/2012  Principal Problem:  *Status epilepticus Active Problems:  Unspecified hypothyroidism  SEIZURE DISORDER  Brown's syndrome  DVT of upper extremity (deep vein thrombosis)  Irregular heart beat  Generalized convulsive epilepsy without mention of intractable epilepsy  A-fib    SUBJECTIVE: No chest pain, no palps, a little sleepy. Taking POs well. No sz since yesterday pm.  OBJECTIVE Filed Vitals:   04/09/12 0500 04/09/12 0515 04/09/12 0600 04/09/12 0700  BP: 78/44 106/48 86/34 102/60  Pulse: 106 110 100 109  Temp:    98.6 F (37 C)  TempSrc:    Oral  Resp: 25 23 23 28   Height: 5\' 3"  (1.6 m)     Weight: 99 lb 3.2 oz (44.997 kg)     SpO2: 91% 98% 98% 100%    Intake/Output Summary (Last 24 hours) at 04/09/12 0804 Last data filed at 04/09/12 0700  Gross per 24 hour  Intake   1950 ml  Output   1520 ml  Net    430 ml   Weight change:  Filed Weights   04/09/12 0500  Weight: 99 lb 3.2 oz (44.997 kg)     PHYSICAL EXAM General: Well developed, well nourished, male, in no acute distress. Head: Normocephalic, atraumatic.  Neck: Supple without bruits, JVD not elevated. Lungs:  Resp regular and unlabored, few basilar rales. Heart: RRR, S1, S2, no S3, S4, harsh murmur - ?systolic plus diastolic murmur. Abdomen: Soft, non-tender, non-distended, BS + x 4.  Extremities: No clubbing, cyanosis, no edema.  Neuro: Alert and oriented X 2 (his baseline). Moves all extremities spontaneously.  LABS:INR: Basename 04/09/12 0515  INR 1.07   Basic Metabolic Panel: Basename 04/09/12 0515 04/08/12 2101  NA 138 136  K 3.1* 3.1*  CL 107 105  CO2 19 19  GLUCOSE 100* 105*  BUN 7 7  CREATININE 0.63 0.67  CALCIUM 8.2* 8.4  MG -- --  PHOS -- --   Cardiac Enzymes: Basename 04/08/12 2101  CKTOTAL 187  CKMB 1.9  CKMBINDEX --  TROPONINI <0.30   TELE:  SR/ST, generally ST but no other arrhythmia     Radiology/Studies: Ir  Veno/ext/uni Right 04/09/2012  *RADIOLOGY REPORT*  Indication: History of severe seizure disorder necessitating chronic port catheter placement for intravenous administration of seizure medications.  Currently with malfunctioning port catheter and severe bilateral central venous occlusion.  1.  FLUOROSCOPIC GUIDED PORT-A-CATHTER REMOVAL 2.  FLUOROSCOPIC GUIDED ATTEMPTED RIGHT INOMINATE VEIN RECANALIZATION 3.  ULTRASOUND GUIDANCE FOR VENOUS ACCESS x2  Comparisons: Bilateral upper extremity central venogram - 03/26/2012; right upper central venogram and port catheter injection - 01/09/2012; chest CT - 09/01/2011; 09/13/2009  Intravenous Medications: General anesthesia; Ancef 1 gram IV. Antibiotic administered within 1 hour of incision.  Contrast: 100 ml Omnipaque-300  Fluoroscopy Time: 29.8 minutes.  Complications: The patient developed sustained atrial fibrillation during the procedure.  Technique:  Informed written consent was obtained from the patient's family after a discussion of the risks, benefits and alternatives to treatment.  Questions regarding the procedure were encouraged and answered.  A timeout was performed prior to the initiation of the procedure.  The skin overlying the right upper chest, neck, right groin and medial aspect of the right upper arm was prepped and draped in the usual sterile fashion, and a sterile drape was applied covering the operative field.  Maximum barrier sterile technique with sterile gowns and gloves were used for the procedure.  A timeout was performed prior to  the initiation of the procedure.  Local anesthesia was provided with 1% lidocaine.  Attention was first paid to worse removal of the malfunctioning port catheter.  An incision was made overlying the [name] with a #15 scalpel.  Utilizing sharp and blunt dissection, the Port-A- Cath reservoir was removed. The catheter tubing was coughed and cannulated with a stiff Glidewire.  Efforts were made to advance a stiff  Glidewire passed the chronic occlusion of the distal end of the tip of the porta catheter, however this ultimately proved unsuccessful. The pocket was irrigated with sterile saline.  A white block was placed within the reservoir pocket and the incision was covered with sterile towels.  Attention was palpated towards recanalization of the known right upper extremity central venous occlusion.  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 A small channel within the chronically occluded right internal jugular vein was accessed with a micropuncture kit however despite prolonged efforts, a micro wire could not be advanced through the chronic occlusion.  Attempts were then made to cross the chronic occlusion from a caudal approach.  The right femoral head with mark fluoroscopically.  Under direct ultrasound guidance, the right common femoral vein was accessed with a micropuncture kit at the overlying soft tissues were anesthetized with 1% lidocaine. An ultrasound image was saved for documentation purposes.  This allowed for placement of a 5-French vascular sheath.  Efforts were made to cross the right innominate occlusion from this caudal approach with the use of a Kumpe catheter and stiff Glidewire, however this ultimately proved unsuccessful.  Attempts were then made to cross the chronic occlusion from a right upper extremity approach.  Under direct ultrasound guidance, the right basilic vein was accessed with a micropuncture kit after the overlying soft tissues were anesthetized with 1% lidocaine.  An ultrasound image was saved for documentation purposes. This allowed for placement of a 6-French vascular sheath.  With the use of both a Kumpe and 4-French angled glide catheter, prolonged efforts were made to cross the central venous occlusion via a dominant hypertrophied mediastinal collateral. Multiple selective central venograms were performed.  Given the abrupt angulation and  tortuosity of the collaterals origin, a 45 degree GT Glidewire as well as a Transcend microwire were utilized to advance a regular Renegade microcatheter into the proximal aspect of the collateral. With this collateral now successfully cannulated, contrast injection failed to demonstrate definite communication with the SVC. As such the procedure was terminated.  All wires catheters and sheaths were removed from the patient.  Attention was now paid towards closure of the port-a-catheter reservoir pocket.  The pocked was irrigated with sterile saline. Wound closure was performed with subcutaneous 3-0 Monocryl, subcuticular 4-0 Vicryl and Dermabond.  Hemostasis was achieved at the right arm and groin access site with manual compression. Dressings were placed.  Dressings were placed.  The patient developed sustained atrial fibrillation during the procedure, however otherwise tolerated the procedure well without immediate postprocedural complication.  Findings:  Successful removal of implanted [name].  Despite prolonged efforts including attempted recanalization of chronically occluded right internal jugular vein and attempted recanalization of chronically occluded right innominate vein both from a caudal as well from a right upper extremity approach, the known central venous occlusion was unable to be traversed. 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  Impression:  1.  Successful fluoroscopic removal of implanted porta-catheter. 2.  Unsuccessful attempted recanalization  of chronically occluded right innominate vein. 3.  Procedure complicated by patient's development sustained atrial fibrillation.  As such, the patient will be admitted to the telemetry unit and will obtain a cardiology consult.  Plan:  Given the severity of the long segment bilateral upper extremity chronic  central venous occlusion, the placement of a femoral approach porta-catheter was discussed with the patient's family. At this time, the family wishes to monitor the patient's seizure activity during the coming months to determine the necessity of a new port-a-catheter placement.  Above findings discussed with Dr. Sharene Skeans at the time of procedure completion.  Original Report Authenticated By: Waynard Reeds, M.D.   Ir Veno/ext/bi 03/26/2012  *RADIOLOGY REPORT*  Clinical Data: Evaluate extent of known central venous occlusion secondary to chronic indwelling porta-catheter.  1.  BILATERAL UPPER EXTREMITY CENTRAL VENOGRAMS 2.  ULTRASOUND GUIDED VENIPUNCTURE BY MD  Comparison: Port injection/right upper extremity venogram - 01/09/2012; chest CT - 09/01/2011  Contrast: 60 ml Omnipaque-300  Complications: None immediate  Technique:  These were placed within the bilateral antecubital region.  A central venogram was performed with injection of contrast via peripheral IVs placed within the bilateral antecubital lesions. Images were viewed.  Secondary to suboptimal opacification of the central aspect of the left upper extremity, the decision was made to access the left brachial vein with a micropuncture kit for ideal opacification.  As such, the left upper arm was prepped with chlorhexidine in a sterile fashion, and a sterile drape was applied covering the operative field.  Local anesthesia was provided with 1% lidocaine. Under direct ultrasound guidance, the left brachial vein was accessed with a micropuncture kit after the overlying soft tissues were anesthetized with 1% lidocaine.  An ultrasound image was saved for documentation purposes. Repeat central left upper extremity venogram was performed via the outer sheath to the micropuncture kit.  The micropuncture sheath was removed and  hemostasis achieved with manual compression.  Dressings were placed.  The patient tolerated the procedure well without immediate post  procedural complication.  Findings:  Right upper extremity:  Central right upper extremity venogram is unchanged compared to prior examination. Grossly unchanged moderate to long segment irregular narrowing of the right innominate vein/central aspect the right subclavian vein with filling of multiple right axillary and supraclavicular venous collaterals and early filling of the hypertrophied azygos system.  The SVC appears patent.  The right IJ approach port catheter tip again projects over the expected location of the central aspect of the left innominate vein.  Left upper extremity:  There is long segment occlusion of the left central venous system with irregular narrowing of the peripheral aspect of the left subclavian vein and non-opacification of the mid or central subclavian vein or innominate.  This finding is associated with filling of multiple hypertrophied axillary and chest wall/intercostal vessels and opacification of the hypertrophied azygos system.  Impression:  1.  Bilateral upper extremity central venous occlusion, left greater than right, as detailed above.  2.  Malpositioned port-a-cath tip, overlying the expected location of the central aspect of the left innominate vein.  Original Report Authenticated By: Waynard Reeds, M.D.   Ir Caffie Damme Svc 04/09/2012  *RADIOLOGY REPORT*  Indication: History of severe seizure disorder necessitating chronic port catheter placement for intravenous administration of seizure medications.  Currently with malfunctioning port catheter and severe bilateral central venous occlusion.  1.  FLUOROSCOPIC GUIDED PORT-A-CATHTER REMOVAL 2.  FLUOROSCOPIC GUIDED ATTEMPTED RIGHT INOMINATE VEIN RECANALIZATION 3.  ULTRASOUND GUIDANCE FOR VENOUS  ACCESS x2  Comparisons: Bilateral upper extremity central venogram - 03/26/2012; right upper central venogram and port catheter injection - 01/09/2012; chest CT - 09/01/2011; 09/13/2009  Intravenous Medications: General anesthesia;  Ancef 1 gram IV. Antibiotic administered within 1 hour of incision.  Contrast: 100 ml Omnipaque-300  Fluoroscopy Time: 29.8 minutes.  Complications: The patient developed sustained atrial fibrillation during the procedure.  Technique:  Informed written consent was obtained from the patient's family after a discussion of the risks, benefits and alternatives to treatment.  Questions regarding the procedure were encouraged and answered.  A timeout was performed prior to the initiation of the procedure.  The skin overlying the right upper chest, neck, right groin and medial aspect of the right upper arm was prepped and draped in the usual sterile fashion, and a sterile drape was applied covering the operative field.  Maximum barrier sterile technique with sterile gowns and gloves were used for the procedure.  A timeout was performed prior to the initiation of the procedure.  Local anesthesia was provided with 1% lidocaine.  Attention was first paid to worse removal of the malfunctioning port catheter.  An incision was made overlying the [name] with a #15 scalpel.  Utilizing sharp and blunt dissection, the Port-A- Cath reservoir was removed. The catheter tubing was coughed and cannulated with a stiff Glidewire.  Efforts were made to advance a stiff Glidewire passed the chronic occlusion of the distal end of the tip of the porta catheter, however this ultimately proved unsuccessful. The pocket was irrigated with sterile saline.  A white block was placed within the reservoir pocket and the incision was covered with sterile towels.  Attention was palpated towards recanalization of the known right upper extremity central venous occlusion.  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 A small channel within the chronically occluded right internal jugular vein was accessed with a micropuncture kit however despite prolonged efforts, a micro wire could not be advanced through the  chronic occlusion.  Attempts were then made to cross the chronic occlusion from a caudal approach.  The right femoral head with mark fluoroscopically.  Under direct ultrasound guidance, the right common femoral vein was accessed with a micropuncture kit at the overlying soft tissues were anesthetized with 1% lidocaine. An ultrasound image was saved for documentation purposes.  This allowed for placement of a 5-French vascular sheath.  Efforts were made to cross the right innominate occlusion from this caudal approach with the use of a Kumpe catheter and stiff Glidewire, however this ultimately proved unsuccessful.  Attempts were then made to cross the chronic occlusion from a right upper extremity approach.  Under direct ultrasound guidance, the right basilic vein was accessed with a micropuncture kit after the overlying soft tissues were anesthetized with 1% lidocaine.  An ultrasound image was saved for documentation purposes. This allowed for placement of a 6-French vascular sheath.  With the use of both a Kumpe and 4-French angled glide catheter, prolonged efforts were made to cross the central venous occlusion via a dominant hypertrophied mediastinal collateral. Multiple selective central venograms were performed.  Given the abrupt angulation and tortuosity of the collaterals origin, a 45 degree GT Glidewire as well as a Transcend microwire were utilized to advance a regular Renegade microcatheter into the proximal aspect of the collateral. With this collateral now successfully cannulated, contrast injection failed to demonstrate definite communication with the SVC. As such the procedure was terminated.  All wires catheters and sheaths were removed from the  patient.  Attention was now paid towards closure of the port-a-catheter reservoir pocket.  The pocked was irrigated with sterile saline. Wound closure was performed with subcutaneous 3-0 Monocryl, subcuticular 4-0 Vicryl and Dermabond.  Hemostasis was  achieved at the right arm and groin access site with manual compression. Dressings were placed.  Dressings were placed.  The patient developed sustained atrial fibrillation during the procedure, however otherwise tolerated the procedure well without immediate postprocedural complication.  Findings:  Successful removal of implanted [name].  Despite prolonged efforts including attempted recanalization of chronically occluded right internal jugular vein and attempted recanalization of chronically occluded right innominate vein both from a caudal as well from a right upper extremity approach, the known central venous occlusion was unable to be traversed. 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  Impression:  1.  Successful fluoroscopic removal of implanted porta-catheter. 2.  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 3.  Procedure complicated by patient's development sustained atrial fibrillation.  As such, the patient will be admitted to the telemetry unit and will obtain a cardiology consult.  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 At this time, the family wishes to monitor the patient's seizure activity during the coming months to determine the necessity of a new port-a-catheter placement.  Above findings discussed with Dr. Sharene Skeans at the time of procedure completion.  Original Report Authenticated By: Waynard Reeds, M.D.   Ir US Guide Vasc Access Left 03/26/2012  *RADIOLOGY REPORT*  Clinical Data: Evaluate extent of known central venous occlusion secondary to chronic indwelling porta-catheter.  1.  BILATERAL UPPER EXTREMITY CENTRAL VENOGRAMS 2.  ULTRASOUND GUIDED  VENIPUNCTURE BY MD  Comparison: Port injection/right upper extremity venogram - 01/09/2012; chest CT - 09/01/2011  Contrast: 60 ml Omnipaque-300  Complications: None immediate  Technique:  These were placed within the bilateral antecubital region.  A central venogram was performed with injection of contrast via peripheral IVs placed within the bilateral antecubital lesions. Images were viewed.  Secondary to suboptimal opacification of the central aspect of the left upper extremity, the decision was made to access the left brachial vein with a micropuncture kit for ideal opacification.  As such, the left upper arm was prepped with chlorhexidine in a sterile fashion, and a sterile drape was applied covering the operative field.  Local anesthesia was provided with 1% lidocaine. Under direct ultrasound guidance, the left brachial vein was accessed with a micropuncture kit after the overlying soft tissues were anesthetized with 1% lidocaine.  An ultrasound image was saved for documentation purposes. Repeat central left upper extremity venogram was performed via the outer sheath to the micropuncture kit.  The micropuncture sheath was removed and  hemostasis achieved with manual compression.  Dressings were placed.  The patient tolerated the procedure well without immediate post procedural complication.  Findings:  Right upper extremity:  Central right upper extremity venogram is unchanged compared to prior examination. Grossly unchanged moderate to long segment irregular narrowing of the right innominate vein/central aspect the right subclavian vein with filling of multiple right axillary and supraclavicular venous collaterals and early filling of the hypertrophied azygos system.  The SVC appears patent.  The right IJ approach port catheter tip again projects over the expected location of the central aspect of the left  innominate vein.  Left upper extremity:  There is long segment occlusion of the left central venous  system with irregular narrowing of the peripheral aspect of the left subclavian vein and non-opacification of the mid or central subclavian vein or innominate.  This finding is associated with filling of multiple hypertrophied axillary and chest wall/intercostal vessels and opacification of the hypertrophied azygos system.  Impression:  1.  Bilateral upper extremity central venous occlusion, left greater than right, as detailed above.  2.  Malpositioned port-a-cath tip, overlying the expected location of the central aspect of the left innominate vein.  Original Report Authenticated By: Waynard Reeds, M.D.   Ir US Guide Vasc Access Right 04/09/2012  *RADIOLOGY REPORT*  Indication: History of severe seizure disorder necessitating chronic port catheter placement for intravenous administration of seizure medications.  Currently with malfunctioning port catheter and severe bilateral central venous occlusion.  1.  FLUOROSCOPIC GUIDED PORT-A-CATHTER REMOVAL 2.  FLUOROSCOPIC GUIDED ATTEMPTED RIGHT INOMINATE VEIN RECANALIZATION 3.  ULTRASOUND GUIDANCE FOR VENOUS ACCESS x2  Comparisons: Bilateral upper extremity central venogram - 03/26/2012; right upper central venogram and port catheter injection - 01/09/2012; chest CT - 09/01/2011; 09/13/2009  Intravenous Medications: General anesthesia; Ancef 1 gram IV. Antibiotic administered within 1 hour of incision.  Contrast: 100 ml Omnipaque-300  Fluoroscopy Time: 29.8 minutes.  Complications: The patient developed sustained atrial fibrillation during the procedure.  Technique:  Informed written consent was obtained from the patient's family after a discussion of the risks, benefits and alternatives to treatment.  Questions regarding the procedure were encouraged and answered.  A timeout was performed prior to the initiation of the procedure.  The skin overlying the right upper chest, neck, right groin and medial aspect of the right upper arm was prepped and draped in the usual  sterile fashion, and a sterile drape was applied covering the operative field.  Maximum barrier sterile technique with sterile gowns and gloves were used for the procedure.  A timeout was performed prior to the initiation of the procedure.  Local anesthesia was provided with 1% lidocaine.  Attention was first paid to worse removal of the malfunctioning port catheter.  An incision was made overlying the [name] with a #15 scalpel.  Utilizing sharp and blunt dissection, the Port-A- Cath reservoir was removed. The catheter tubing was coughed and cannulated with a stiff Glidewire.  Efforts were made to advance a stiff Glidewire passed the chronic occlusion of the distal end of the tip of the porta catheter, however this ultimately proved unsuccessful. The pocket was irrigated with sterile saline.  A white block was placed within the reservoir pocket and the incision was covered with sterile towels.  Attention was palpated towards recanalization of the known right upper extremity central venous occlusion.  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 A small channel within the chronically occluded right internal jugular vein was accessed with a micropuncture kit however despite prolonged efforts, a micro wire could not be advanced through the chronic occlusion.  Attempts were then made to cross the chronic occlusion from a caudal approach.  The right femoral head with mark fluoroscopically.  Under direct ultrasound guidance, the right common femoral vein was accessed with a micropuncture kit at the overlying soft tissues were anesthetized with 1% lidocaine. An ultrasound image was saved for documentation purposes.  This allowed for placement of a 5-French vascular sheath.  Efforts were made to cross the right innominate occlusion from this caudal approach with the use of  a Kumpe catheter and stiff Glidewire, however this ultimately proved unsuccessful.  Attempts were then made  to cross the chronic occlusion from a right upper extremity approach.  Under direct ultrasound guidance, the right basilic vein was accessed with a micropuncture kit after the overlying soft tissues were anesthetized with 1% lidocaine.  An ultrasound image was saved for documentation purposes. This allowed for placement of a 6-French vascular sheath.  With the use of both a Kumpe and 4-French angled glide catheter, prolonged efforts were made to cross the central venous occlusion via a dominant hypertrophied mediastinal collateral. Multiple selective central venograms were performed.  Given the abrupt angulation and tortuosity of the collaterals origin, a 45 degree GT Glidewire as well as a Transcend microwire were utilized to advance a regular Renegade microcatheter into the proximal aspect of the collateral. With this collateral now successfully cannulated, contrast injection failed to demonstrate definite communication with the SVC. As such the procedure was terminated.  All wires catheters and sheaths were removed from the patient.  Attention was now paid towards closure of the port-a-catheter reservoir pocket.  The pocked was irrigated with sterile saline. Wound closure was performed with subcutaneous 3-0 Monocryl, subcuticular 4-0 Vicryl and Dermabond.  Hemostasis was achieved at the right arm and groin access site with manual compression. Dressings were placed.  Dressings were placed.  The patient developed sustained atrial fibrillation during the procedure, however otherwise tolerated the procedure well without immediate postprocedural complication.  Findings:  Successful removal of implanted [name].  Despite prolonged efforts including attempted recanalization of chronically occluded right internal jugular vein and attempted recanalization of chronically occluded right innominate vein both from a caudal as well from a right upper extremity approach, the known central venous occlusion was unable to be  traversed. 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  Impression:  1.  Successful fluoroscopic removal of implanted porta-catheter. 2.  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 3.  Procedure complicated by patient's development sustained atrial fibrillation.  As such, the patient will be admitted to the telemetry unit and will obtain a cardiology consult.  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 At this time, the family wishes to monitor the patient's seizure activity during the coming months to determine the necessity of a new port-a-catheter placement.  Above findings discussed with Dr. Sharene Skeans at the time of procedure completion.  Original Report Authenticated By: Waynard Reeds, M.D.   Ir US Guide Vasc Access Right 04/09/2012  *RADIOLOGY REPORT*  Indication: History of severe seizure disorder necessitating chronic port catheter placement for intravenous administration of seizure medications.  Currently with malfunctioning port catheter and severe bilateral central venous occlusion.  1.  FLUOROSCOPIC GUIDED PORT-A-CATHTER REMOVAL 2.  FLUOROSCOPIC GUIDED ATTEMPTED RIGHT INOMINATE VEIN RECANALIZATION 3.  ULTRASOUND GUIDANCE FOR VENOUS ACCESS x2  Comparisons: Bilateral upper extremity central venogram - 03/26/2012; right upper central venogram and port catheter injection - 01/09/2012; chest CT - 09/01/2011; 09/13/2009  Intravenous Medications: General anesthesia; Ancef 1 gram IV. Antibiotic administered within 1 hour of incision.  Contrast: 100 ml Omnipaque-300  Fluoroscopy Time: 29.8 minutes.  Complications: The patient developed sustained atrial fibrillation during the  procedure.  Technique:  Informed written consent was obtained from the patient's family after a discussion of the  risks, benefits and alternatives to treatment.  Questions regarding the procedure were encouraged and answered.  A timeout was performed prior to the initiation of the procedure.  The skin overlying the right upper chest, neck, right groin and medial aspect of the right upper arm was prepped and draped in the usual sterile fashion, and a sterile drape was applied covering the operative field.  Maximum barrier sterile technique with sterile gowns and gloves were used for the procedure.  A timeout was performed prior to the initiation of the procedure.  Local anesthesia was provided with 1% lidocaine.  Attention was first paid to worse removal of the malfunctioning port catheter.  An incision was made overlying the [name] with a #15 scalpel.  Utilizing sharp and blunt dissection, the Port-A- Cath reservoir was removed. The catheter tubing was coughed and cannulated with a stiff Glidewire.  Efforts were made to advance a stiff Glidewire passed the chronic occlusion of the distal end of the tip of the porta catheter, however this ultimately proved unsuccessful. The pocket was irrigated with sterile saline.  A white block was placed within the reservoir pocket and the incision was covered with sterile towels.  Attention was palpated towards recanalization of the known right upper extremity central venous occlusion.  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 A small channel within the chronically occluded right internal jugular vein was accessed with a micropuncture kit however despite prolonged efforts, a micro wire could not be advanced through the chronic occlusion.  Attempts were then made to cross the chronic occlusion from a caudal approach.  The right femoral head with mark fluoroscopically.  Under direct ultrasound guidance, the right common femoral vein  was accessed with a micropuncture kit at the overlying soft tissues were anesthetized with 1% lidocaine. An ultrasound image was saved for documentation purposes.  This allowed for placement of a 5-French vascular sheath.  Efforts were made to cross the right innominate occlusion from this caudal approach with the use of a Kumpe catheter and stiff Glidewire, however this ultimately proved unsuccessful.  Attempts were then made to cross the chronic occlusion from a right upper extremity approach.  Under direct ultrasound guidance, the right basilic vein was accessed with a micropuncture kit after the overlying soft tissues were anesthetized with 1% lidocaine.  An ultrasound image was saved for documentation purposes. This allowed for placement of a 6-French vascular sheath.  With the use of both a Kumpe and 4-French angled glide catheter, prolonged efforts were made to cross the central venous occlusion via a dominant hypertrophied mediastinal collateral. Multiple selective central venograms were performed.  Given the abrupt angulation and tortuosity of the collaterals origin, a 45 degree GT Glidewire as well as a Transcend microwire were utilized to advance a regular Renegade microcatheter into the proximal aspect of the collateral. With this collateral now successfully cannulated, contrast injection failed to demonstrate definite communication with the SVC. As such the procedure was terminated.  All wires catheters and sheaths were removed from the patient.  Attention was now paid towards closure of the port-a-catheter reservoir pocket.  The pocked was irrigated with sterile saline. Wound closure was performed with subcutaneous 3-0 Monocryl, subcuticular 4-0 Vicryl and Dermabond.  Hemostasis was achieved at the right arm and groin access site with manual compression. Dressings were placed.  Dressings were placed.  The patient developed sustained atrial fibrillation during the procedure, however otherwise tolerated  the procedure well without immediate postprocedural complication.  Findings:  Successful  removal of implanted [name].  Despite prolonged efforts including attempted recanalization of chronically occluded right internal jugular vein and attempted recanalization of chronically occluded right innominate vein both from a caudal as well from a right upper extremity approach, the known central venous occlusion was unable to be traversed. 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  Impression:  1.  Successful fluoroscopic removal of implanted porta-catheter. 2.  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 3.  Procedure complicated by patient's development sustained atrial fibrillation.  As such, the patient will be admitted to the telemetry unit and will obtain a cardiology consult.  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 At this time, the family wishes to monitor the patient's seizure activity during the coming months to determine the necessity of a new port-a-catheter placement.  Above findings discussed with Dr. Sharene Skeans at the time of procedure completion.  Original Report Authenticated By: Waynard Reeds, M.D.   Current Medications:    . carbamazepine  250 mg Oral TID  . ceFAZolin      .  ceFAZolin (ANCEF) IV  2 g Intravenous Once  . enoxaparin (LOVENOX) injection  40 mg Subcutaneous Q24H  . lacosamide  50 mg Oral TID  . levetiracetam  1,500 mg Intravenous NOW  . levETIRAcetam  1,500 mg Oral BID  . levothyroxine  50 mcg Oral Daily  . loratadine  10 mg Oral Daily  . LORazepam      . potassium chloride SA  20 mEq Oral QID  . potassium phosphate (monobasic)  500 mg Oral  QID  . sodium chloride  3 mL Intravenous Q12H  . topiramate  150 mg Oral TID      . sodium chloride 50 mL/hr at 04/09/12 0700  . DISCONTD: sodium chloride      ASSESSMENT AND PLAN: PAF - none since last pm. Pt HR elevated, taking POs well. Since BP on the low side, encouraged PO intake. Will not add daily Rx unless it happens again. Make sure TSH ordered, ck echo. If these are OK, no further eval as in-pt, f/u with CM as planned.   Re: need for OP vascular access -  SK described a procedure that may provide access since pt has bilateral upper venous occlusions. Have texted SK to let him know Dr Sharene Skeans wishes to pursue this. Dr Hickling's number is [phone].  Hypokalemia - Has sig amounts of K+ ordered, per primary MD.  Otherwise, per primary MD/Neurology Principal Problem:  *Status epilepticus Active Problems:  Unspecified hypothyroidism  SEIZURE DISORDER  Brown's syndrome  DVT of upper extremity (deep vein thrombosis)  Irregular heart beat  Generalized convulsive epilepsy without mention of intractable epilepsy    Melida Quitter , PA-C 8:04 AM 04/09/2012

## 2012-04-09 NOTE — Progress Notes (Signed)
Name: [name] MRN: [id number] DOB: 1986-01-02    LOS: 1  Referring Provider:  Lavell Luster" Reason for Referral:  Seizures  PULMONARY / CRITICAL CARE MEDICINE  HPI:  This is a 26 y/o male with a lengthy seizure disorder history, Brown syndrome, mental retardation, who was admitted on 7/18 for replacement of a portocath.  Unfortunately he developed several seizures before and after the procedure (which unfortunately was unsuccessful) and he developed atrial fibrillation.  He has been given Keppra and multiple doses of benzodiazepines and his seizures have slowed, but he is now being monitored in the ICU overnight.  Neurology and Cardiology have both been involved with his care today.  Allergies Allergies  Allergen Reactions  . Divalproex Sodium Other (See Comments)    Nausea, vomiting, liver and kidney dysfunction  . Phenytoin Rash    Hypersensitivity  reaction  . Vancomycin Rash    Redman's Syndrome     Events Since Admission: 7/18 port-a-cath removal  Current Status:  Vital Signs: Temp:  [97.5 F (36.4 C)-98.6 F (37 C)] 98.6 F (37 C) (07/19 0700) Pulse Rate:  [71-115] 109  (07/19 0700) Resp:  [19-49] 28  (07/19 0700) BP: (78-129)/(34-74) 102/60 mmHg (07/19 0700) SpO2:  [91 %-100 %] 100 % (07/19 0700) Weight:  [44.997 kg (99 lb 3.2 oz)] 44.997 kg (99 lb 3.2 oz) (07/19 0500)  Physical Examination: Gen: somnolent but arouses and follows commands HEENT: No head trauma, EOMi, OP clear, neck supple without masses PULM: CTA B CV: RRR, no mgr, no JVD AB: BS+, soft, nontender, no hsm Ext: warm, no edema, no clubbing, no cyanosis Derm: no rash or skin breakdown Neuro: somnolent but arouses, follows simple commands   Principal Problem:  *Status epilepticus Active Problems:  Unspecified hypothyroidism  SEIZURE DISORDER  Brown's syndrome  DVT of upper extremity (deep vein thrombosis)  Irregular heart beat  Generalized convulsive epilepsy without  mention of intractable epilepsy  A-fib   ASSESSMENT AND PLAN  NEUROLOGIC  A:  Seizure disorder, status epilepticus; now > 1.5 hours without seizure after benzo and keppra P:   -per neurology  PULMONARY No results found for this basename: PHART:5,PCO2:5,PCO2ART:5,PO2ART:5,HCO3:5,O2SAT:5 in the last 168 hours Ventilator Settings:   CXR:   ETT:  n/a  A:  S/P multiple benzodiazepines and anti-epileptics, now protecting airway and breathing comfortably despite somnolence P:   -monitor respiratory status in ICU setting -O2 as needed -nasal trumpet while somonlent -avoid intubation (multiple predictors of a difficult airway based on anatomy)  CARDIOVASCULAR  Lab 04/08/12 2101  TROPONINI <0.30  LATICACIDVEN --  PROBNP --   ECG:  Sinus tach Lines: peripheral  A: By report he developed transient a-fib but I have no objective evidence of this on tele or EKG Central venous occlusion prevented replacement of port-a-cath P:  -tele monitoring -per cardiology -echo in AM -cardiology and IR discussing other access options ? portocath via femoral approach May need temp PICC in meantime   RENAL  Lab 04/09/12 0515 04/08/12 2101  NA 138 136  K 3.1* 3.1*  CL 107 105  CO2 19 19  BUN 7 7  CREATININE 0.63 0.67  CALCIUM 8.2* 8.4  MG -- --  PHOS -- --   Intake/Output      07/18 0701 - 07/19 0700 07/19 0701 - 07/20 0700   P.O. 300    I.V. (mL/kg) 1650 (36.7)    Total Intake(mL/kg) 1950 (43.3)    Urine (mL/kg/hr) 1500 (1.4)    Blood 20  Total Output 1520    Net +430            BMET    Component Value Date/Time   NA 138 04/09/2012 0515   K 3.1* 04/09/2012 0515   CL 107 04/09/2012 0515   CO2 19 04/09/2012 0515   GLUCOSE 100* 04/09/2012 0515   BUN 7 04/09/2012 0515   CREATININE 0.63 04/09/2012 0515   CALCIUM 8.2* 04/09/2012 0515   GFRNONAA >90 04/09/2012 0515   GFRAA >90 04/09/2012 0515     Foley:    A:  Hypokalemia, chronic replacement at home P:   -monitor  UOP -home KCl and KPhos -BMET in AM  GASTROINTESTINAL No results found for this basename: AST:5,ALT:5,ALKPHOS:5,BILITOT:5,PROT:5,ALBUMIN:5 in the last 168 hours  A:  No acute issues P:   -advance diet when mental status improves  HEMATOLOGIC CBC    Component Value Date/Time   WBC 4.0 03/26/2012 0804   RBC 4.57 03/26/2012 0804   HGB 12.9* 03/26/2012 0804   HCT 38.2* 03/26/2012 0804   PLT 224 03/26/2012 0804   MCV 83.6 03/26/2012 0804   MCH 28.2 03/26/2012 0804   MCHC 33.8 03/26/2012 0804   RDW 13.5 03/26/2012 0804   LYMPHSABS 1.2 12/08/2011 1134   MONOABS 0.2 12/08/2011 1134   EOSABS 0.0 12/08/2011 1134   BASOSABS 0.0 12/08/2011 1134    A:  DVT, on warfarin, recent INR 2.4 P:  -monitor for bleeding -resume home warfarin when taking po  INFECTIOUS No results found for this basename: WBC:5,PROCALCITON:5 in the last 168 hours Cultures:  Antibiotics:   A:  No acute issues P:   -monitor glucose  ENDOCRINE No results found for this basename: GLUCAP:5 in the last 168 hours A:  Hypothyroid P:   -home synthroid when taking po    BEST PRACTICE / DISPOSITION Level of Care:  ICU Primary Service:  PCCM Consultants:  Neurology, Cardiology Code Status:  full Diet:  Advance as tolerated DVT Px:  Warfarin (resume 7/19) GI Px:  n/a Skin Integrity:  normal Social / Family:  Updated family at bedside.    Oretha Milch., M.D. Pulmonary and Critical Care Medicine [facility] Pager: [phone]  04/09/2012, 9:32 AM

## 2012-04-09 NOTE — Consult Note (Signed)
Neurology Hospital Consultation History and Physical  Patient name: [name] Medical record number: [id number] Date of birth: March 16, 1986 Age: 26 y.o. Gender: male  Primary Care Provider: Eartha Inch, MD  Chief Complaint: Recurrent multiple seizures and new onset transient atrial fibrillation History of Present Illness: [name] is a 26 y.o. year old male presenting with recurrent seizures and new onset atrial fibrillation. [name] is been a patient of mine since he was 26 years of age.  He has a complex history that is been recounted by numerous clinicians who have seen him in the past 24 hours.  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  Recently some of the secondary generalized seizures have been associated with significant problems with apnea.  When he was much younger, he had hospitalizations every other week for status epilepticus.  We had difficulty with IV access.  A decision was made to place a [name].  This allowed not only hospital staff, but the family to access the catheter which kept him out of the hospital for several years.  He was treated with 1 mg of lorazepam and 60 mg of phenobarbital which usually brought his seizures under control.  Recently he's been involved in a trial of intramuscular Valium which is also brought his recurrent seizures under control to some degree.  Over time his seizures have become more frequent, last longer, and are associated with apnea.  This is despite being on 4 antiepileptic medications and vagal nerve stimulator.  A CT scan shows significant calcifications in the basal ganglia in the dentate regions.  Because of multiple system abnormalities, he has been evaluated for mitochondrial disease, but no underlying unifying problem has been found.  Over the years, the port has been infected with staph epidermidis on several occasions and was  able to be sterilized.  Recently he developed deep vein thrombosis in the left subclavian the but the swelling of his face neck and arm.  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  The [name] stopped working, and evaluation showed that there was also significant occlusion in the right upper extremity.  He was placed on oral Coumadin to prevent further embolic events and to improve venous return to his heart.  The patient was admitted by Dr. Humphrey Rolls to remove a [name] which was thought to be the source of emboli.  Venogram on July 5 showed that he has significant collateral city azygos system and no clear means to place a central catheter peripherally.  This was attempted yesterday.  Fortunately he was able to remove the old [name] in total.  Unfortunately is unable to find a pathway to place a new [name].  Prior to this procedure he had a 7 and generalized tonic-clonic seizure with apnea.  Following the procedure he had a series of several generalized tonic-clonic seizures is somewhat short duration.  These have been brought under control with IV Keppra.  He is also noted to have atrial fibrillation which is a new finding.  On the basis of these multiple problems he was admitted to neuro intensive care unit for observation and treatment.  I very much appreciate the efforts of the hospitalists, neurohospitalist, and, interventional cardiology in assessing the patient and stabilizing him.  He had a good night and had no further seizures.  It appears that there may be a way to insert a [name] from the groin.  The family would  be very interested in this if it was technically feasible.  He also will need to be studied with echocardiogram.  I don't know if there will be any need to perform a electrodiagnostic study of his heart.  He has had a problem with persistent hypokalemia.  It's not ever been cleared as a potassium wasting syndrome.   He has been treated with oral potassium phosphate and potassium chloride for years to maintain low normal potassiums.  Potassium was not an issue yesterday.  Review Of Systems: Per HPI with the following additions: None except as noted above Otherwise 12 point review of systems was performed and was unremarkable.   Past Medical History: Past Medical History  Diagnosis Date  . ALLERGIC RHINITIS 12/26/2009  . MITRAL VALVE PROLAPSE 06/22/2007  . SEIZURE DISORDER 06/22/2007  . Unspecified hypothyroidism 06/22/2007  . Growth disorder   . Mental retardation, mild (I.Q. 50-70)   . Brown's syndrome   . Irregular heart beat   . Heart murmur     VSD  . Brown's syndrome, bilateral   . Peripheral vascular disease   . Coronary artery disease     mitral valve prolapse  . Sleep apnea     cpap , last sleep study 10/01/11  . Hypokalemia     Past Surgical History: Past Surgical History  Procedure Date  . Implantation vagal nerve stimulator   . Portacath placement   . 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 Marital Status: Single    Spouse Name: N/A    Number of Children: N/A  . Years of Education: N/A   Social History Main Topics  . Smoking status: Never Smoker   . Smokeless tobacco: Never Used  . Alcohol Use: No  . Drug Use: No  . Sexually Active: No   Other Topics Concern  . None   Social History Narrative  . None    Family History: Family History  Problem Relation Age of Onset  . Breast cancer Mother   . Asthma Father     Allergies: Allergies  Allergen Reactions  . Divalproex Sodium Other (See Comments)    Nausea, vomiting, liver and kidney dysfunction  . Phenytoin Rash    Hypersensitivity  reaction  . Vancomycin Rash    Redman's Syndrome    Medications: Current Facility-Administered Medications  Medication Dose Route Frequency Provider Last Rate Last Dose  . 0.9 %  sodium chloride infusion   Intravenous  Continuous Sorin Luanne Bras, MD 50 mL/hr at 04/09/12 0700    . acetaminophen (TYLENOL) tablet 650 mg  650 mg Oral Q6H PRN Sorin Luanne Bras, MD       Or  . acetaminophen (TYLENOL) suppository 650 mg  650 mg Rectal Q6H PRN Sorin Luanne Bras, MD      . carbamazepine (TEGRETOL) chewable tablet 250 mg  250 mg Oral TID Michael Litter, PA   250 mg at 04/08/12 2155  . ceFAZolin (ANCEF) 2-3 GM-% IVPB SOLR           . ceFAZolin (ANCEF) IVPB 2 g/50 mL premix  2 g Intravenous Once Oneal Grout, MD   1 g at 04/08/12 1155  . diazepam (VALIUM) injection 10 mg  10 mg Intramuscular Daily PRN Marlane Mingle Hiatt, PHARMD   10 mg at 04/08/12 1856  . diazepam (VALIUM) tablet 2 mg  2 mg Oral PRN Michael Litter, PA      .  enoxaparin (LOVENOX) injection 40 mg  40 mg Subcutaneous Q24H Sorin Luanne Bras, MD   40 mg at 04/08/12 2156  . lacosamide (VIMPAT) tablet 50 mg  50 mg Oral TID Simonne Come, MD   50 mg at 04/08/12 2245  . levETIRAcetam (KEPPRA) 1,500 mg in sodium chloride 0.9 % 100 mL IVPB  1,500 mg Intravenous NOW Cathlyn Parsons, PA-C   1,500 mg at 04/08/12 1915  . levETIRAcetam (KEPPRA) tablet 1,500 mg  1,500 mg Oral BID Cathlyn Parsons, PA-C      . levothyroxine (SYNTHROID, LEVOTHROID) tablet 50 mcg  50 mcg Oral Daily Michael Litter, [place]      . loratadine (CLARITIN) tablet 10 mg  10 mg Oral Daily Michael Litter, [place]   10 mg at 04/08/12 2008  . LORazepam (ATIVAN) 2 MG/ML injection           . LORazepam (ATIVAN) injection 2 mg  2 mg Intravenous Q1H PRN Sorin Luanne Bras, MD   2 mg at 04/08/12 1827  . LORazepam (ATIVAN) tablet 2 mg  2 mg Oral Q8H PRN Kendra P Hiatt, PHARMD      . PHENObarbital (LUMINAL) injection 65 mg  65 mg Intravenous Daily PRN Kendra P Hiatt, PHARMD      . potassium chloride SA (K-DUR,KLOR-CON) CR tablet 20 mEq  20 mEq Oral QID Michael Litter, PA   20 mEq at 04/08/12 2155  . potassium phosphate (monobasic) (K-PHOS ORIGINAL) tablet 500 mg  500 mg Oral QID Michael Litter, PA   500 mg at 04/08/12 2019  . sodium chloride  0.9 % injection 3 mL  3 mL Intravenous Q12H Sorin Luanne Bras, MD   3 mL at 04/08/12 2215  . topiramate (TOPAMAX) tablet 150 mg  150 mg Oral TID Michael Litter, PA   150 mg at 04/08/12 2156  . DISCONTD: 0.45 % sodium chloride infusion   Intravenous Continuous Simonne Come, MD      . DISCONTD: 0.9 %  sodium chloride infusion   Intravenous Once Brayton El, PA      . DISCONTD: ceFAZolin (ANCEF) IVPB 1 g/50 mL premix  1 g Intravenous Once Brayton El, PA      . DISCONTD: Diazepam DEVI 10 mg  10 mg Intramuscular Daily PRN Michael Litter, PA      . DISCONTD: droperidol (INAPSINE) injection 0.625 mg  0.625 mg Intravenous PRN Remonia Richter, MD      . DISCONTD: HYDROmorphone (DILAUDID) injection 0.25-0.5 mg  0.25-0.5 mg Intravenous Q5 min PRN Remonia Richter, MD      . DISCONTD: Lacosamide SOLN 50 mg  5 mL Oral TID Michael Litter, PA      . DISCONTD: Lacosamide SOLN 50 mg  5 mL Oral TID Simonne Come, MD      . DISCONTD: levETIRAcetam (KEPPRA) tablet 1,500 mg  1,500 mg Oral BID Michael Litter, PA      . DISCONTD: LORazepam (ATIVAN) 2 MG/ML concentrated solution 2 mg  2 mg Oral Q8H PRN Michael Litter, PA      . DISCONTD: LORazepam (ATIVAN) injection 2 mg  2 mg Intravenous Once Aubery Lapping, MD   2 mg at 04/08/12 1718  . DISCONTD: phenobarbital (LUMINAL) injection 65 mg  65 mg Intravenous Daily PRN Michael Litter, PA       Facility-Administered Medications Ordered in Other Encounters  Medication Dose Route Frequency Provider Last Rate Last Dose  . iohexol (OMNIPAQUE) 300 MG/ML solution 150 mL  150 mL Intravenous Once PRN  Simonne Come, MD   100 mL at 04/08/12 1604  . DISCONTD: chlorhexidine (HIBICLENS) 4 % liquid           . DISCONTD: dextrose 5 % solution    Continuous PRN Marni Griffon, CRNA      . DISCONTD: fentaNYL (SUBLIMAZE) injection    PRN Marni Griffon, CRNA   50 mcg at 04/08/12 1350  . DISCONTD: glycopyrrolate (ROBINUL) injection    PRN Marni Griffon, CRNA   0.6 mg at 04/08/12 1440    . DISCONTD: lactated ringers infusion    Continuous PRN Marni Griffon, CRNA      . DISCONTD: lidocaine (cardiac) 100 mg/16ml (XYLOCAINE) 20 MG/ML injection 2%    PRN Marni Griffon, CRNA   20 mg at 04/08/12 1140  . DISCONTD: midazolam (VERSED) 5 MG/5ML injection    PRN Marni Griffon, CRNA   1 mg at 04/08/12 1140  . DISCONTD: neostigmine (PROSTIGMINE) injection   Intravenous PRN Marni Griffon, CRNA   4 mg at 04/08/12 1440  . DISCONTD: ondansetron (ZOFRAN) injection    PRN Marni Griffon, CRNA   4 mg at 04/08/12 1435  . DISCONTD: phenylephrine (NEO-SYNEPHRINE) 0.04 mg/mL in sodium chloride 0.9 % 250 mL infusion  10 mg  Continuous PRN Marni Griffon, CRNA   10 mcg/min at 04/08/12 1357  . DISCONTD: propofol (DIPRIVAN) 10 mg/ml infusion    PRN Marni Griffon, CRNA   200 mg at 04/08/12 1140  . DISCONTD: rocuronium Jettie Booze) injection    PRN Marni Griffon, CRNA   5 mg at 04/08/12 1338   Physical Exam: Pulse: 109  Blood Pressure: 102/60 RR: 28   O2: 100 on RA Temp: 97.6  Weight: 45 Kg Height: 63 in  GEN: Thin, small stature, sleepy, but near behavioral baseline HEENT: No signs of infection in the head neck CV: Holosystolic murmur, pulse is normal, capillary refill normal RESP:Lungs clear to auscultation GNF:AOZH bowel sounds normal no pets when medically EXTR:Decreased muscle mass, mild contractures, no significant increase in tone SKIN:No lesions NEURO:Awake sleepy but able to follow commands, count fingers and name objects Round reactive pupils, mild disconjugate eye movements with esotropia, full visual fields, symmetric facial strength, and midline tongue, mild dysarthria Mild diffuse axial weakness with good effort, clumsy fine motor movements, no pronator drift Withdrawal x4 without peripheral neuropathy, good stereognosis Deep tendon reflexes are absent, bilateral flexor plantar responses  Labs and Imaging: Lab Results  Component Value Date/Time   NA 138 04/09/2012  5:15 AM   K 3.1* 04/09/2012   5:15 AM   CL 107 04/09/2012  5:15 AM   CO2 19 04/09/2012  5:15 AM   BUN 7 04/09/2012  5:15 AM   CREATININE 0.63 04/09/2012  5:15 AM   GLUCOSE 100* 04/09/2012  5:15 AM   Lab Results  Component Value Date   WBC 4.0 03/26/2012   HGB 12.9* 03/26/2012   HCT 38.2* 03/26/2012   MCV 83.6 03/26/2012   PLT 224 03/26/2012    Assessment and Plan: Davion Flannery is a 26 y.o. year old male presenting with recurrent seizures and transient atrial fibrillation 1. Please continue his antiepileptic medications as they were prescribed as an outpatient.  He has recently had increasing seizures after he was switched from carbamazepine to Tegretol.  We will need to address this with [facility].  Vimpat has made him sleepy, but has increased distance between seizures. 2. FEN/GI: Start feedings today unless there  are procedures planned. 3. Disposition: I think that he is past the crisis and no longer needs ICU care but will defer to Dr. Lavera Guise and other caregivers.  If possible I would like to see the [name] placed during this hospitalization, but I understand that there may be needs to defer this until later.  I agree with the plans to further investigate his heart and appreciate Dr. Odessa Fleming input. 4.  I can be reached at [phone] day or night for assistance with this patient.  Deanna Artis. Sharene Skeans, M.D. Child Neurology Attending 04/09/2012

## 2012-04-10 ENCOUNTER — Inpatient Hospital Stay (HOSPITAL_COMMUNITY): Payer: Medicaid Other

## 2012-04-10 LAB — CBC
MCH: 28 pg (ref 26.0–34.0)
MCHC: 33.4 g/dL (ref 30.0–36.0)
Platelets: 189 10*3/uL (ref 150–400)
RDW: 13.4 % (ref 11.5–15.5)

## 2012-04-10 LAB — BASIC METABOLIC PANEL
BUN: 8 mg/dL (ref 6–23)
Calcium: 8.6 mg/dL (ref 8.4–10.5)
GFR calc Af Amer: >90 mL/min (ref >90)
GFR calc non Af Amer: >90 mL/min (ref >90)
Glucose, Bld: 102 mg/dL — ABNORMAL HIGH (ref 70–99)

## 2012-04-10 NOTE — Discharge Summary (Addendum)
Physician Discharge Summary   Patient ID: [name] [id number] 25 y.o. 08-Nov-1985  Admit date: 04/08/2012  Discharge date and time: 04/10/2012, 8:32 AM  Admitting Physician: Marcus Come, MD   Discharge Physician: Marcus Farr, MD  Admission Diagnoses: ces disorder, status epilepticus, new onset atrial fibrillation  Discharge Diagnoses: status epilepticus  Admission Condition: serious  Discharged Condition: good  Indication for Admission: respiratory distress, new onset atrial fibrillation  Hospital Course: Patient after experiencing multiple seizures s/p attempt to gain access was admitted due to respiratory distress and status epilepticus.  Patient also was noted to develop atrial fibrillation.  Cardiology evaluated the patient.  Patient spontaneously converted back to sinus rhythm.  Remained tachycardic.  TSH was checked and was normal.  Echocardiogram was performed and shows some inferior wall hypokinesis but no other significant abnormalities.  Remaining vitals were stable.  Patient did not require intubation but did require multiple doses of benzodiazepines with the addition of Phenobarbital as well.  Although multiple seizures were noted on 7/18 and 7/19, the patient did well the night of 7/19-20 with no seizures being noted.  Patient was stable for discharge on 7/20. It was determined that the patient was not a candidate for access at this facility.  Is to be seen at [name] as an outpatient to determine if there are any options available for him there.    Consults: cardiology  Significant Diagnostic Studies: echocardiogram  Treatments: antiepileptics, benzodiazepines  Discharge Exam: Mental Status: Patient awake and alert.  Follows commands.  Gives appropriate one word answers to questions.   CN: EOM's intact.  Tongue midline Motor: Moves all extremities   Disposition: 01-Home or Self Care  Patient Instructions:  Medication List  As of 04/10/2012  8:32 AM   TAKE  these medications         carbamazepine 100 MG chewable tablet   Commonly known as: TEGRETOL   Chew 250 mg by mouth 3 (three) times daily. Take at 8am, 1:30pm, and 9pm.      cetirizine 10 MG tablet   Commonly known as: ZYRTEC   Take 10 mg by mouth every morning.      diazepam 2 MG tablet   Commonly known as: VALIUM   Take 2 mg by mouth as needed. Take as needed when having sensation seizure.      Diazepam 10 MG/2ML Devi   Inject 10 mg into the muscle daily as needed. For seizures.      levETIRAcetam 500 MG tablet   Commonly known as: KEPPRA   Take 1,500 mg by mouth 2 (two) times daily.      levothyroxine 50 MCG tablet   Commonly known as: SYNTHROID, LEVOTHROID   Take 50 mcg by mouth daily.      LORazepam 2 MG/ML concentrated solution   Commonly known as: ATIVAN   Take 2 mg by mouth every 8 (eight) hours as needed. For seizures or anxiety.      phenobarbital 60 MG/ML injection   Commonly known as: LUMINAL   Inject 65 mg into the vein daily as needed. For seizures      potassium chloride SA 20 MEQ tablet   Commonly known as: K-DUR,KLOR-CON   Take 20 mEq by mouth 4 (four) times daily.      potassium phosphate (monobasic) 500 MG tablet   Commonly known as: K-PHOS ORIGINAL   Take 500 mg by mouth 4 (four) times daily. Take at 7a, 130p, 5p, and 9p      TOPAMAX 100  MG tablet   Generic drug: topiramate   Take 150 mg by mouth 3 (three) times daily. Take at 8am, 1:30pm, and 9pm.      VIMPAT 10 MG/ML Soln   Generic drug: Lacosamide   Take 5 mLs by mouth 3 (three) times daily.      warfarin 5 MG tablet   Commonly known as: COUMADIN   Take 5 mg by mouth daily.      warfarin 7.5 MG tablet   Commonly known as: COUMADIN   Take 7.5 mg by mouth daily.           Activity: activity as tolerated and no driving Diet: regular diet Wound Care: none needed  Follow-up with Dr [name] in 3 days.  40 minutes spent in discharge of this patient.  Discussion had with family  concerning all findings during hospitalization.   Signed: Thana Farr, MD [facility] [phone] 04/10/2012 8:32 AM

## 2012-04-10 NOTE — Progress Notes (Signed)
No seizures overnight. Echo reviewed - mild LV dysfunction with inferior hypokinesis - cards on board. Access issues ntoed. PCCM to sign off, pl call as needed  Irean Kendricks V.  [phone]

## 2012-04-10 NOTE — Progress Notes (Addendum)
Notified Gretchen, RN with [facility] about patient's blood pressure consistently being low.  Dr. Darrick Penna followed up with hypotension.  No new orders received at this time.  Will continue to monitor patient.

## 2012-04-12 NOTE — Care Management Note (Signed)
    Page 1 of 1   04/12/2012     8:49:24 AM   CARE MANAGEMENT NOTE 04/12/2012  Patient:  [name], [name]   Account Number:  [id number]  Date Initiated:  04/09/2012  Documentation initiated by:  Jacquelynn Cree  Subjective/Objective Assessment:   Admitted with seizures during portacath replacement. Lives with parents.     Action/Plan:   Anticipated DC Date:  04/11/2012   Anticipated DC Plan:  HOME/SELF CARE      DC Planning Services  CM consult      Choice offered to / List presented to:             Status of service:  Completed, signed off Medicare Important Message given?   (If response is "NO", the following Medicare IM given date fields will be blank) Date Medicare IM given:   Date Additional Medicare IM given:    Discharge Disposition:  HOME/SELF CARE  Per UR Regulation:  Reviewed for med. necessity/level of care/duration of stay  If discussed at Long Length of Stay Meetings, dates discussed:    Comments:

## 2012-04-14 ENCOUNTER — Institutional Professional Consult (permissible substitution): Payer: Medicaid Other | Admitting: Cardiovascular Disease

## 2012-04-15 ENCOUNTER — Telehealth: Payer: Self-pay | Admitting: Emergency Medicine

## 2012-04-15 NOTE — Telephone Encounter (Signed)
PT MOM LM HERE WANTING TO KNOW IF DR WATTS HAS CONTACTED [facility] TO SET UP AN APPT.   S/W PT. MOM- DORIS TO MAKE HER AWARE THAT DR WATTS HAS SENT ALL INFO AND IMAGES TO DR Will Bonnet AT [facility] AND ALISHA FROM THAT OFFICE SHOULD CONTACT HER TO SET UP CONSULT.  I TOLD DORIS IF SHE HAS NOT HEARD ANYTHING BY THE FIRST OF NEXT WEEK TO LET ME KNOW AND I WILL CHECK ON THE STATUS FOR HER.

## 2012-04-29 ENCOUNTER — Encounter: Payer: Self-pay | Admitting: Cardiovascular Disease

## 2012-04-29 ENCOUNTER — Ambulatory Visit (INDEPENDENT_AMBULATORY_CARE_PROVIDER_SITE_OTHER): Payer: Medicaid Other | Admitting: Cardiovascular Disease

## 2012-04-29 VITALS — BP 105/78 | HR 87 | Ht 63.0 in | Wt 95.0 lb

## 2012-04-29 DIAGNOSIS — I340 Nonrheumatic mitral (valve) insufficiency: Secondary | ICD-10-CM

## 2012-04-29 DIAGNOSIS — I059 Rheumatic mitral valve disease, unspecified: Secondary | ICD-10-CM

## 2012-04-29 DIAGNOSIS — I4891 Unspecified atrial fibrillation: Secondary | ICD-10-CM

## 2012-04-29 NOTE — Assessment & Plan Note (Addendum)
One episode during a procedure in July. Likely induced by the wire. No recurrence. He is in normal sinus rhythm today. No further workup or treatment.

## 2012-04-29 NOTE — Addendum Note (Signed)
Addended by: Dossie Arbour on: 04/29/2012 06:10 PM   Modules accepted: Orders

## 2012-04-29 NOTE — Patient Instructions (Addendum)
Your physician wants you to follow-up in:  12 months. You will receive a reminder letter in the mail two months in advance. If you don't receive a letter, please call our office to schedule the follow-up appointment.  Your physician has requested that you have an echocardiogram. Echocardiography is a painless test that uses sound waves to create images of your heart. It provides your doctor with information about the size and shape of your heart and how well your heart's chambers and valves are working. This procedure takes approximately one hour. There are no restrictions for this procedure. To be done in 1-2 months.     

## 2012-04-29 NOTE — Assessment & Plan Note (Signed)
Moderate MR by echo July 2013. Unchanged from echo in 2010. He was noted to have mild hypokinesis of the inferior wall on echo but low probability of CAD. No further workup. Repeat echo in one year.

## 2012-04-29 NOTE — Progress Notes (Signed)
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 Berton Mount on 04/08/12. He is added onto my schedule today for hospital f/u. He was admitted on 04/08/12 to get a [name] replacement. They removed the old [name] but were not able to insert a new one because of right-sided central venous occlusion. During wire manipulatoin, he developed rapid atrial fibrillation.  In PACU, the atrial fibrillation resolved without medical intervention. At the time of evaluation, he was maintaining sinus rhythm and had no recurrence during the hospital stay. He has frequent seizures and had one during the cardiology consultation per records.   He tells me that he feels well. He has been having seizures 3-4 times per week. He has no other complaints. His breathing has been ok. No chest pain. Plans for replacement of portacath at [facility] on 05/10/12.   Primary Care Physician: Antony Haste  Past Medical History  Diagnosis Date  . ALLERGIC RHINITIS 12/26/2009  . MITRAL VALVE PROLAPSE 06/22/2007  . SEIZURE DISORDER 06/22/2007  . Unspecified hypothyroidism 06/22/2007  . Growth disorder   . Mental retardation, mild (I.Q. 50-70)   . Brown's syndrome   . Heart murmur     moderate MR  . Sleep apnea     cpap , last sleep study 10/01/11  . Hypokalemia   . Atrial fibrillation     During port removal and attempted placement in July 2013, transientt  . Hyperlipidemia     No therapy    Past Surgical History  Procedure Date  . Implantation vagal nerve stimulator     Left  . Portacath placement     and removal 04/08/12  . Eye surgery     X2 for [facility] Syndrome    Current Outpatient Prescriptions  Medication Sig Dispense Refill  . carbamazepine (TEGRETOL) 100 MG  chewable tablet Chew 250 mg by mouth 3 (three) times daily. Take at 8am, 1:30pm, and 9pm.      . cetirizine (ZYRTEC) 10 MG tablet Take 10 mg by mouth every morning.      . diazepam (VALIUM) 2 MG tablet Take 2 mg by mouth as needed. Take as needed when having sensation seizure.       . Diazepam 10 MG/2ML DEVI Inject 10 mg into the muscle daily as needed. For seizures.      . Lacosamide (VIMPAT) 10 MG/ML SOLN Take 5 mLs by mouth 3 (three) times daily.      Marland Kitchen levETIRAcetam (KEPPRA) 500 MG tablet Take 1,500 mg by mouth 2 (two) times daily.      Marland Kitchen levothyroxine (SYNTHROID, LEVOTHROID) 50 MCG tablet Take 50 mcg by mouth daily.        Marland Kitchen LORazepam (ATIVAN) 2 MG/ML concentrated solution Take 2 mg by mouth every 8 (eight) hours as needed. It would be given by IV      . phenobarbital (LUMINAL) 60 MG/ML injection Inject 65 mg into the vein daily as needed. For seizures      . potassium chloride SA (K-DUR,KLOR-CON) 20 MEQ tablet Take 20 mEq by mouth 4 (four) times daily.      . potassium phosphate, monobasic, (K-PHOS) 500 MG tablet Take 500 mg by mouth 4 (four) times daily. Take at 7a, 130p, 5p, and 9p      . topiramate (TOPAMAX) 100  MG tablet Take 150 mg by mouth 3 (three) times daily. Take at 8am, 1:30pm, and 9pm.        Allergies  Allergen Reactions  . Divalproex Sodium Other (See Comments)    Nausea, vomiting, liver and kidney dysfunction  . Phenytoin Rash    Hypersensitivity  reaction  . Vancomycin Rash    Redman's Syndrome    History   Social History  . Marital Status: Single    Spouse Name: N/A    Number of Children: 0  . Years of Education: N/A   Occupational History  . Disabled    Social History Main Topics  . Smoking status: Never Smoker   . Smokeless tobacco: Never Used  . Alcohol Use: No  . Drug Use: No  . Sexually Active: No   Other Topics Concern  . Not on file   Social History Narrative  . No narrative on file    Family History  Problem Relation Age of Onset  .  Breast cancer Mother   . Asthma Father   . Diabetes Father   . Coronary artery disease Maternal Grandfather   . Coronary artery disease Paternal Grandfather     Review of Systems:  As stated in the HPI and otherwise negative.   BP 105/78  Pulse 87  Ht 5\' 3"  (1.6 m)  Wt 95 lb (43.092 kg)  BMI 16.83 kg/m2  Physical Examination: General: Well developed, well nourished, NAD HEENT: OP clear, mucus membranes moist SKIN: warm, dry. No rashes. Neuro: No focal deficits Musculoskeletal: Muscle strength 5/5 all ext Psychiatric: Mood and affect normal Neck: No JVD, no carotid bruits, no thyromegaly, no lymphadenopathy. Lungs:Clear bilaterally, no wheezes, rhonci, crackles Cardiovascular: Regular rate and rhythm. Slight systolic  Murmur. No gallops or rubs. Abdomen:Soft. Bowel sounds present. Non-tender.  Extremities: No lower extremity edema. Pulses are 2 + in the bilateral DP/PT.  Echo 04/09/12:  Left ventricle: EF hard to evaluate due to poor endocardial definition. Appears mildly depressed with possible inferior wall hypokinesis The cavity size was mildly dilated. (inferior wall not clearly seen) - Mitral valve: No prolapse seen. Posterior leaflet with restricted motion and eccentric posteriorly directed MR. Overall MR appears moderate - Atrial septum: No defect or patent foramen ovale was identified.

## 2012-05-03 NOTE — Addendum Note (Signed)
Addended by: Dossie Arbour on: 05/03/2012 01:45 PM   Modules accepted: Orders

## 2012-05-10 ENCOUNTER — Ambulatory Visit: Payer: Self-pay | Admitting: Pharmacist

## 2012-05-10 DIAGNOSIS — I82629 Acute embolism and thrombosis of deep veins of unspecified upper extremity: Secondary | ICD-10-CM

## 2012-05-16 ENCOUNTER — Emergency Department (HOSPITAL_COMMUNITY): Payer: Medicaid Other

## 2012-05-16 ENCOUNTER — Encounter (HOSPITAL_COMMUNITY): Payer: Self-pay | Admitting: *Deleted

## 2012-05-16 ENCOUNTER — Inpatient Hospital Stay (HOSPITAL_COMMUNITY)
Admission: EM | Admit: 2012-05-16 | Discharge: 2012-05-18 | DRG: 100 | Disposition: A | Discharge status: Home / Self Care | Payer: Medicaid Other | Attending: Internal Medicine | Admitting: Internal Medicine

## 2012-05-16 DIAGNOSIS — G40909 Epilepsy, unspecified, not intractable, without status epilepticus: Secondary | ICD-10-CM

## 2012-05-16 DIAGNOSIS — G40109 Localization-related (focal) (partial) symptomatic epilepsy and epileptic syndromes with simple partial seizures, not intractable, without status epilepticus: Secondary | ICD-10-CM

## 2012-05-16 DIAGNOSIS — I499 Cardiac arrhythmia, unspecified: Secondary | ICD-10-CM

## 2012-05-16 DIAGNOSIS — Z8249 Family history of ischemic heart disease and other diseases of the circulatory system: Secondary | ICD-10-CM

## 2012-05-16 DIAGNOSIS — I4891 Unspecified atrial fibrillation: Secondary | ICD-10-CM

## 2012-05-16 DIAGNOSIS — J309 Allergic rhinitis, unspecified: Secondary | ICD-10-CM

## 2012-05-16 DIAGNOSIS — R6252 Short stature (child): Secondary | ICD-10-CM | POA: Diagnosis present

## 2012-05-16 DIAGNOSIS — Z86718 Personal history of other venous thrombosis and embolism: Secondary | ICD-10-CM

## 2012-05-16 DIAGNOSIS — R569 Unspecified convulsions: Secondary | ICD-10-CM

## 2012-05-16 DIAGNOSIS — G40209 Localization-related (focal) (partial) symptomatic epilepsy and epileptic syndromes with complex partial seizures, not intractable, without status epilepticus: Principal | ICD-10-CM

## 2012-05-16 DIAGNOSIS — G40309 Generalized idiopathic epilepsy and epileptic syndromes, not intractable, without status epilepticus: Secondary | ICD-10-CM

## 2012-05-16 DIAGNOSIS — E876 Hypokalemia: Secondary | ICD-10-CM | POA: Diagnosis present

## 2012-05-16 DIAGNOSIS — E785 Hyperlipidemia, unspecified: Secondary | ICD-10-CM | POA: Diagnosis present

## 2012-05-16 DIAGNOSIS — R197 Diarrhea, unspecified: Secondary | ICD-10-CM

## 2012-05-16 DIAGNOSIS — Z7901 Long term (current) use of anticoagulants: Secondary | ICD-10-CM

## 2012-05-16 DIAGNOSIS — Z888 Allergy status to other drugs, medicaments and biological substances status: Secondary | ICD-10-CM

## 2012-05-16 DIAGNOSIS — E872 Acidosis, unspecified: Secondary | ICD-10-CM | POA: Diagnosis present

## 2012-05-16 DIAGNOSIS — H5060 Mechanical strabismus, unspecified: Secondary | ICD-10-CM

## 2012-05-16 DIAGNOSIS — T421X1A Poisoning by iminostilbenes, accidental (unintentional), initial encounter: Secondary | ICD-10-CM

## 2012-05-16 DIAGNOSIS — J690 Pneumonitis due to inhalation of food and vomit: Secondary | ICD-10-CM

## 2012-05-16 DIAGNOSIS — I82629 Acute embolism and thrombosis of deep veins of unspecified upper extremity: Secondary | ICD-10-CM

## 2012-05-16 DIAGNOSIS — T4275XA Adverse effect of unspecified antiepileptic and sedative-hypnotic drugs, initial encounter: Secondary | ICD-10-CM | POA: Diagnosis present

## 2012-05-16 DIAGNOSIS — G40901 Epilepsy, unspecified, not intractable, with status epilepticus: Secondary | ICD-10-CM

## 2012-05-16 DIAGNOSIS — F70 Mild intellectual disabilities: Secondary | ICD-10-CM | POA: Diagnosis present

## 2012-05-16 DIAGNOSIS — G4730 Sleep apnea, unspecified: Secondary | ICD-10-CM | POA: Diagnosis present

## 2012-05-16 DIAGNOSIS — I340 Nonrheumatic mitral (valve) insufficiency: Secondary | ICD-10-CM

## 2012-05-16 DIAGNOSIS — Z881 Allergy status to other antibiotic agents status: Secondary | ICD-10-CM

## 2012-05-16 DIAGNOSIS — I059 Rheumatic mitral valve disease, unspecified: Secondary | ICD-10-CM

## 2012-05-16 DIAGNOSIS — E039 Hypothyroidism, unspecified: Secondary | ICD-10-CM

## 2012-05-16 DIAGNOSIS — Y92009 Unspecified place in unspecified non-institutional (private) residence as the place of occurrence of the external cause: Secondary | ICD-10-CM

## 2012-05-16 LAB — CBC WITH DIFFERENTIAL/PLATELET
Basophils Absolute: 0 10*3/uL (ref 0.0–0.1)
Eosinophils Relative: 0 % (ref 0–5)
Lymphocytes Relative: 23 % (ref 12–46)
Neutro Abs: 5.4 10*3/uL (ref 1.7–7.7)
Neutrophils Relative %: 67 % (ref 43–77)
Platelets: 228 10*3/uL (ref 150–400)
RBC: 3.74 MIL/uL — ABNORMAL LOW (ref 4.22–5.81)
RDW: 13.1 % (ref 11.5–15.5)
WBC: 8.2 10*3/uL (ref 4.0–10.5)

## 2012-05-16 LAB — CARBAMAZEPINE LEVEL, TOTAL: Carbamazepine Lvl: 12.8 ug/mL — ABNORMAL HIGH (ref 4.0–12.0)

## 2012-05-16 LAB — POCT I-STAT, CHEM 8
BUN: 3 mg/dL — ABNORMAL LOW (ref 6–23)
Calcium, Ion: 1.17 mmol/L (ref 1.12–1.23)
Chloride: 109 mEq/L (ref 96–112)
Glucose, Bld: 119 mg/dL — ABNORMAL HIGH (ref 70–99)
HCT: 37 % — ABNORMAL LOW (ref 39.0–52.0)

## 2012-05-16 LAB — URINALYSIS, ROUTINE W REFLEX MICROSCOPIC
Bilirubin Urine: NEGATIVE
Ketones, ur: NEGATIVE mg/dL
Nitrite: NEGATIVE
Protein, ur: NEGATIVE mg/dL
pH: 8 (ref 5.0–8.0)

## 2012-05-16 LAB — URINE MICROSCOPIC-ADD ON

## 2012-05-16 LAB — PROTIME-INR
INR: 2.58 — ABNORMAL HIGH (ref 0.00–1.49)
Prothrombin Time: 28.1 seconds — ABNORMAL HIGH (ref 11.6–15.2)

## 2012-05-16 LAB — APTT: aPTT: 57 seconds — ABNORMAL HIGH (ref 24–37)

## 2012-05-16 MED ORDER — LORAZEPAM 1 MG PO TABS
2.0000 mg | ORAL_TABLET | Freq: Once | ORAL | Status: AC
  Administered 2012-05-17: 2 mg via ORAL
  Filled 2012-05-16: qty 2

## 2012-05-16 NOTE — ED Notes (Signed)
Pt mother states that pt has seizures. Pt has a port that was just placed this past week for medications administration. Pt had 2 seizures this evening and mother administered 10mg  of versead for the 2 seizures. Pt has an MR history and unable to fully articulate how he feels pt does state he has been coughing and feels nauseated. Pt mother concerned about seizures.

## 2012-05-16 NOTE — ED Provider Notes (Signed)
History     CSN: [id number]  Arrival date & time 05/16/12  4098   First MD Initiated Contact with Patient 05/16/12 2209      Chief Complaint  Patient presents with  . Seizures    post op and history    (Consider location/radiation/quality/duration/timing/severity/associated sxs/prior treatment) Patient is a 26 y.o. male presenting with seizures. The history is provided by the patient.  Seizures  This is a chronic problem. The current episode started 6 to 12 hours ago. The problem has been resolved. There were more than 10 seizures. Duration: unknown. Associated symptoms include cough. Pertinent negatives include no headaches, no speech difficulty, no chest pain, no nausea, no vomiting and no diarrhea. Characteristics include rhythmic jerking (LUE). Characteristics do not include eye blinking, eye deviation, bowel incontinence, loss of consciousness, apnea or cyanosis. The episode was witnessed. There was the sensation of an aura present. The seizures did not continue in the ED. The seizure(s) had left-sided (Pt has history of focal and generalized tonic-clonic siezures.  Today reports only focal seizures.) focality. Possible causes do not include med or dosage change or missed seizure meds. Pt had new port placed 3 days ago and had 2 breakthrough seizures while in the hospital.    Past Medical History  Diagnosis Date  . ALLERGIC RHINITIS 12/26/2009  . MITRAL VALVE PROLAPSE 06/22/2007  . SEIZURE DISORDER 06/22/2007  . Unspecified hypothyroidism 06/22/2007  . Growth disorder   . Mental retardation, mild (I.Q. 50-70)   . Brown's syndrome   . Heart murmur     moderate MR  . Sleep apnea     cpap , last sleep study 10/01/11  . Hypokalemia   . Atrial fibrillation     During port removal and attempted placement in July 2013, transientt  . Hyperlipidemia     No therapy    Past Surgical History  Procedure Date  . Implantation vagal nerve stimulator     Left  . Portacath placement     and  removal 04/08/12  . Eye surgery     X2 for Brown Syndrome    Family History  Problem Relation Age of Onset  . Breast cancer Mother   . Asthma Father   . Diabetes Father   . Coronary artery disease Maternal Grandfather   . Coronary artery disease Paternal Grandfather     History  Substance Use Topics  . Smoking status: Never Smoker   . Smokeless tobacco: Never Used  . Alcohol Use: No      Review of Systems  Constitutional: Negative for fever and chills.  HENT: Negative.   Eyes: Negative.   Respiratory: Positive for cough. Negative for apnea and shortness of breath.   Cardiovascular: Negative for chest pain, palpitations and cyanosis.  Gastrointestinal: Negative for nausea, vomiting, abdominal pain, diarrhea, constipation and bowel incontinence.  Genitourinary: Negative.   Musculoskeletal: Negative.   Skin: Negative.   Neurological: Positive for seizures. Negative for dizziness, loss of consciousness, syncope, speech difficulty, weakness, light-headedness, numbness and headaches.  All other systems reviewed and are negative.    Allergies  Divalproex sodium; Phenytoin; and Vancomycin  Home Medications   Current Outpatient Rx  Name Route Sig Dispense Refill  . CARBAMAZEPINE 100 MG PO CHEW Oral Chew 250 mg by mouth 3 (three) times daily. Take at 7am, 1:30pm, and 9pm.    . CETIRIZINE HCL 10 MG PO TABS Oral Take 10 mg by mouth every morning.    Marland Kitchen DIAZEPAM 2 MG PO TABS Oral  Take 2 mg by mouth as needed. Take as needed when having sensation seizure.     Marland Kitchen DIAZEPAM 10 MG/2ML IM DEVI Intramuscular Inject 10 mg into the muscle daily as needed. For seizures.    Marland Kitchen LACOSAMIDE 10 MG/ML PO SOLN Oral Take 5 mLs by mouth 3 (three) times daily. 7am 130p and 9pm    . LEVETIRACETAM 500 MG PO TABS Oral Take 1,500 mg by mouth 2 (two) times daily.    Marland Kitchen LEVOTHYROXINE SODIUM 50 MCG PO TABS Oral Take 50 mcg by mouth every morning.     Marland Kitchen LORAZEPAM 2 MG/ML PO CONC Oral Take 2 mg by mouth every 8  (eight) hours as needed. It would be given by IV    . PHENOBARBITAL SODIUM 60 MG/ML IJ SOLN Intravenous Inject 65 mg into the vein daily as needed. For seizures    . POTASSIUM CHLORIDE CRYS ER 20 MEQ PO TBCR Oral Take 20 mEq by mouth 4 (four) times daily.    Marland Kitchen POTASSIUM PHOSPHATE MONOBASIC 500 MG PO TABS Oral Take 500 mg by mouth 4 (four) times daily. Take at 7a, 130p, 5p, and 9p    . TOPIRAMATE 100 MG PO TABS Oral Take 150 mg by mouth 3 (three) times daily. Take at 7am, 1:30pm, and 9pm.      BP 99/58  Pulse 96  Temp 97.7 F (36.5 C) (Oral)  Resp 29  Ht 5\' 3"  (1.6 m)  Wt 96 lb (43.545 kg)  BMI 17.01 kg/m2  SpO2 100%  Physical Exam  Nursing note and vitals reviewed. Constitutional: He is oriented to person, place, and time. He appears well-developed and well-nourished. No distress.  HENT:  Head: Normocephalic and atraumatic.  Eyes: Conjunctivae are normal.  Neck: Neck supple.  Cardiovascular: Normal rate, regular rhythm, normal heart sounds and intact distal pulses.   Pulmonary/Chest: Effort normal and breath sounds normal. He has no wheezes. He has no rales.  Abdominal: Soft. He exhibits no distension. There is no tenderness.  Musculoskeletal: Normal range of motion.  Neurological: He is alert and oriented to person, place, and time. He has normal strength. No cranial nerve deficit or sensory deficit.  Skin: Skin is warm and dry.    ED Course  Procedures (including critical care time)  Labs Reviewed  CBC WITH DIFFERENTIAL - Abnormal; Notable for the following:    RBC 3.74 (*)     Hemoglobin 10.6 (*)     HCT 30.9 (*)     All other components within normal limits  CARBAMAZEPINE LEVEL, TOTAL - Abnormal; Notable for the following:    Carbamazepine Lvl 12.8 (*)     All other components within normal limits  APTT - Abnormal; Notable for the following:    aPTT 57 (*)     All other components within normal limits  PROTIME-INR - Abnormal; Notable for the following:     Prothrombin Time 28.1 (*)     INR 2.58 (*)     All other components within normal limits  URINALYSIS, ROUTINE W REFLEX MICROSCOPIC - Abnormal; Notable for the following:    APPearance CLOUDY (*)     Glucose, UA 250 (*)     Hgb urine dipstick TRACE (*)     All other components within normal limits  POCT I-STAT, CHEM 8 - Abnormal; Notable for the following:    Potassium 3.2 (*)     BUN 3 (*)     Glucose, Bld 119 (*)     Hemoglobin 12.6 (*)  HCT 37.0 (*)     All other components within normal limits  URINE MICROSCOPIC-ADD ON - Abnormal; Notable for the following:    Bacteria, UA FEW (*)     All other components within normal limits   Dg Chest Portable 1 View  05/16/2012  *RADIOLOGY REPORT*  Clinical Data: Cough.  Seizures.  PORTABLE CHEST - 1 VIEW  Comparison: Plain film of the chest 04/10/2012.  Findings: [name] is in place with the tip projecting over the lower superior vena cava.  Neural stimulator device on the left is again seen.  The patient is rotated on the study.  There is some hazy airspace opacity in the right lung base.  Left lung is clear. No pneumothorax.  Heart size normal.  IMPRESSION: Hazy right basilar airspace disease could be due to atelectasis, aspiration pneumonia.   Original Report Authenticated By: Bernadene Bell. D'ALESSIO, M.D.      1. Seizure disorder       MDM  26 yo male with PMHx of seizures, mild MR who presents for focal seizures today.  Pt has had unknown number but at least 10 focal seizures with rhythmic jerking of the LUE.  No generalized tonic-clonic seizures today, though pt has had them in the past.  Pt was given a total of 10 mg diazempam today.  No seizure activity continued in the ED.  No recent medication or dosage changes.  He has had a mild cough for the past 2 days.  Pt had new catheter port placed 3 days ago and had 2 breakthrough generalized tonic-clonic seizures while in the hospital during that admission.  AF, VSS, NAD at presentation.   Will get labs including appropriate medication levels and CXR.  Labs remarkable for elevated carbamazepine level.  No signs of infection.  Neurology consulted and will see pt in the ED.  Neurology recommends holding carbamazepine dose tonight and decreasing dose tomorrow.  Will admit to Hospitalist for observation.          Cherre Robins, MD 05/17/12 0030

## 2012-05-16 NOTE — Consult Note (Signed)
Reason for Consult: Seizure Referring Physician: Glynn Octave  CC: Breakthrough seizures  History is obtained from: Mother, father, patient  HPI: [name] is an 26 y.o. male with a history of seizure disorder, hypothyroidism, mental retardation, Brown's syndrome, left subclavian clot secondary to port who had an increase in his typical seizure frequency today. He has multiple types of seizures including tonic-clonic and partial seizures. He typically gets 1-3 a week, but today had 13, all partial sensory seizures. He was given IM diazepam at 5:30 PM and has not had any since that time.   He is managed by Dr. Olin Pia of [facility].   Also, his parents have noticed that he has had "jumpy eyes" and some nausea similar to a previous episode where he was toxic on carbamazepine.   He was recently started on Lovenox and Coumadin do to a subclavian vein thrombosis, but is therapeutic on his INR here.  He has not had fevers, but has had a cough. no burning when he urinates.  Also of note, he had a severe bout of breakthrough seizures last week, requiring admission at [facility].  ROS: An 11 point ROS was performed and is negative except as noted in the HPI.  Past Medical History  Diagnosis Date  . ALLERGIC RHINITIS 12/26/2009  . MITRAL VALVE PROLAPSE 06/22/2007  . SEIZURE DISORDER 06/22/2007  . Unspecified hypothyroidism 06/22/2007  . Growth disorder   . Mental retardation, mild (I.Q. 50-70)   . Brown's syndrome   . Heart murmur     moderate MR  . Sleep apnea     cpap , last sleep study 10/01/11  . Hypokalemia   . Atrial fibrillation     During port removal and attempted placement in July 2013, transientt  . Hyperlipidemia     No therapy    Family History: Mother-breast cancer  Social History: Lives with parents  Exam: Current vital signs: BP 110/66  Pulse 89  Temp 97.7 F (36.5 C) (Oral)  Resp 21  Ht 5\' 3"  (1.6 m)  Wt 43.545 kg (96 lb)  BMI 17.01 kg/m2   SpO2 100% Vital signs in last 24 hours: Temp:  [97.7 F (36.5 C)-99.6 F (37.6 C)] 97.7 F (36.5 C) (08/25 2233) Pulse Rate:  [89] 89  (08/25 2233) Resp:  [20-21] 21  (08/25 2233) BP: (108-110)/(66-67) 110/66 mmHg (08/25 2233) SpO2:  [100 %] 100 % (08/25 2233) Weight:  [43.545 kg (96 lb)] 43.545 kg (96 lb) (08/25 2233)  General: In bed Mental Status: Patient is awake, alert, has cognitive delay, able to help give history Cranial Nerves: II: Visual Fields are full. Pupils are equal, round, and reactive to light.  [name],IV, VI: Eyes are slightly dysconjugate with a left hypermetria.  V,VII: Facial sensation and movement are symmetric.  VIII: hearing is intact to voice X: Uvula elevates symmetrically XI: Shoulder shrug is symmetric. XII: tongue is midline without atrophy or fasciculations.  Motor: Tone is spastic in his lower extremities. He has 4/5 strength thorough upper extremities and 4-/5 in lower extremities. He has decreased bulk in his lower extremities as well.  Sensory: Sensation is symmetric to light touch. Deep Tendon Reflexes: 2+ and symmetric in the biceps, unable to ellicit at ankle or knee.  Plantars: Toes are downgoing bilaterally.   I have reviewed labs in epic and the results pertinent to this consultation are: BMP mildly low potasium.  INR theraputic.  CBZ - 12.8( this was shortly before his evening medications, therefore represents a  trough level)  Impression: 26 yo M with breakthrough seizures and symptomatic supratheraputic carbamazepine level. Seizures can occur in patients with carbamazepine toxicity, but typically with higher levels than what he has today. I would hold his evening dose and start him on 200mg  TID tomorrow morning.   Recommendations: 1) Hold evening carbamazepine dose with AM level 2) Restart carbamazepine at 200mg  TID tomorrow 3) Continue vimpat, keppra, topiramate doses per home schedule. Parents giving PM dose now from home supply.  4)  will give single dose of ativan 2mg  PO given that we are holding his PM CBZ dose.  5) Admit to observation.  6) Reassess tomorrow AM   Ritta Slot, MD [facility] [phone]

## 2012-05-17 ENCOUNTER — Encounter (HOSPITAL_COMMUNITY): Payer: Self-pay | Admitting: Internal Medicine

## 2012-05-17 DIAGNOSIS — T50904A Poisoning by unspecified drugs, medicaments and biological substances, undetermined, initial encounter: Secondary | ICD-10-CM

## 2012-05-17 DIAGNOSIS — T50901A Poisoning by unspecified drugs, medicaments and biological substances, accidental (unintentional), initial encounter: Secondary | ICD-10-CM

## 2012-05-17 DIAGNOSIS — R197 Diarrhea, unspecified: Secondary | ICD-10-CM | POA: Diagnosis present

## 2012-05-17 DIAGNOSIS — I82629 Acute embolism and thrombosis of deep veins of unspecified upper extremity: Secondary | ICD-10-CM

## 2012-05-17 DIAGNOSIS — G40909 Epilepsy, unspecified, not intractable, without status epilepticus: Secondary | ICD-10-CM

## 2012-05-17 DIAGNOSIS — H5060 Mechanical strabismus, unspecified: Secondary | ICD-10-CM

## 2012-05-17 DIAGNOSIS — T426X1A Poisoning by other antiepileptic and sedative-hypnotic drugs, accidental (unintentional), initial encounter: Secondary | ICD-10-CM

## 2012-05-17 DIAGNOSIS — R569 Unspecified convulsions: Secondary | ICD-10-CM

## 2012-05-17 DIAGNOSIS — J690 Pneumonitis due to inhalation of food and vomit: Secondary | ICD-10-CM

## 2012-05-17 DIAGNOSIS — T421X1A Poisoning by iminostilbenes, accidental (unintentional), initial encounter: Secondary | ICD-10-CM | POA: Diagnosis present

## 2012-05-17 LAB — CBC WITH DIFFERENTIAL/PLATELET
Basophils Absolute: 0 10*3/uL (ref 0.0–0.1)
Eosinophils Relative: 0 % (ref 0–5)
HCT: 28.4 % — ABNORMAL LOW (ref 39.0–52.0)
Hemoglobin: 9.6 g/dL — ABNORMAL LOW (ref 13.0–17.0)
Lymphocytes Relative: 24 % (ref 12–46)
Lymphs Abs: 1.6 10*3/uL (ref 0.7–4.0)
MCV: 83.3 fL (ref 78.0–100.0)
Monocytes Absolute: 0.7 10*3/uL (ref 0.1–1.0)
Monocytes Relative: 10 % (ref 3–12)
Neutro Abs: 4.3 10*3/uL (ref 1.7–7.7)
RBC: 3.41 MIL/uL — ABNORMAL LOW (ref 4.22–5.81)
RDW: 13.1 % (ref 11.5–15.5)
WBC: 6.5 10*3/uL (ref 4.0–10.5)

## 2012-05-17 LAB — COMPREHENSIVE METABOLIC PANEL
Albumin: 2.5 g/dL — ABNORMAL LOW (ref 3.5–5.2)
BUN: 5 mg/dL — ABNORMAL LOW (ref 6–23)
Calcium: 8.3 mg/dL — ABNORMAL LOW (ref 8.4–10.5)
GFR calc Af Amer: >90 mL/min (ref >90)
Glucose, Bld: 98 mg/dL (ref 70–99)
Potassium: 3.1 mEq/L — ABNORMAL LOW (ref 3.5–5.1)
Total Protein: 6.5 g/dL (ref 6.0–8.3)

## 2012-05-17 MED ORDER — TOPIRAMATE 25 MG PO TABS
150.0000 mg | ORAL_TABLET | Freq: Three times a day (TID) | ORAL | Status: DC
  Administered 2012-05-17 – 2012-05-18 (×5): 150 mg via ORAL
  Filled 2012-05-17 (×8): qty 2

## 2012-05-17 MED ORDER — POTASSIUM CHLORIDE CRYS ER 20 MEQ PO TBCR
40.0000 meq | EXTENDED_RELEASE_TABLET | Freq: Two times a day (BID) | ORAL | Status: AC
  Administered 2012-05-17 (×2): 40 meq via ORAL
  Filled 2012-05-17 (×2): qty 2

## 2012-05-17 MED ORDER — LORAZEPAM 2 MG/ML IJ SOLN: 1.0000 mg | INTRAMUSCULAR | Status: DC | PRN

## 2012-05-17 MED ORDER — TOPIRAMATE 25 MG PO TABS: 150.0000 mg | ORAL_TABLET | Freq: Three times a day (TID) | ORAL | Status: DC

## 2012-05-17 MED ORDER — K PHOS MONO-SOD PHOS DI & MONO 155-852-130 MG PO TABS
500.0000 mg | ORAL_TABLET | Freq: Three times a day (TID) | ORAL | Status: DC
  Administered 2012-05-17 – 2012-05-18 (×6): 500 mg via ORAL
  Filled 2012-05-17 (×10): qty 2

## 2012-05-17 MED ORDER — POTASSIUM CHLORIDE IN NACL 20-0.45 MEQ/L-% IV SOLN
INTRAVENOUS | Status: AC
  Administered 2012-05-17: 03:00:00 via INTRAVENOUS
  Filled 2012-05-17 (×2): qty 1000

## 2012-05-17 MED ORDER — LACOSAMIDE 50 MG PO TABS: 50.0000 mg | ORAL_TABLET | Freq: Three times a day (TID) | ORAL | Status: DC

## 2012-05-17 MED ORDER — ACETAMINOPHEN 650 MG RE SUPP: 650.0000 mg | Freq: Four times a day (QID) | RECTAL | Status: DC | PRN

## 2012-05-17 MED ORDER — ONDANSETRON HCL 4 MG/2ML IJ SOLN: 4.0000 mg | Freq: Four times a day (QID) | INTRAMUSCULAR | Status: DC | PRN

## 2012-05-17 MED ORDER — WARFARIN SODIUM 5 MG PO TABS
5.0000 mg | ORAL_TABLET | Freq: Every day | ORAL | Status: DC
  Administered 2012-05-17: 5 mg via ORAL
  Filled 2012-05-17 (×2): qty 1

## 2012-05-17 MED ORDER — SODIUM CHLORIDE 0.9 % IV SOLN
3.0000 g | Freq: Four times a day (QID) | INTRAVENOUS | Status: DC
  Administered 2012-05-17 (×2): 3 g via INTRAVENOUS
  Filled 2012-05-17 (×3): qty 3

## 2012-05-17 MED ORDER — LEVETIRACETAM 750 MG PO TABS: 1500.0000 mg | ORAL_TABLET | Freq: Two times a day (BID) | ORAL | Status: DC

## 2012-05-17 MED ORDER — ONDANSETRON HCL 4 MG PO TABS: 4.0000 mg | ORAL_TABLET | Freq: Four times a day (QID) | ORAL | Status: DC | PRN

## 2012-05-17 MED ORDER — POTASSIUM CHLORIDE CRYS ER 20 MEQ PO TBCR
20.0000 meq | EXTENDED_RELEASE_TABLET | Freq: Three times a day (TID) | ORAL | Status: DC
  Administered 2012-05-17 – 2012-05-18 (×6): 20 meq via ORAL
  Filled 2012-05-17 (×10): qty 1

## 2012-05-17 MED ORDER — LACOSAMIDE 50 MG PO TABS
50.0000 mg | ORAL_TABLET | Freq: Three times a day (TID) | ORAL | Status: DC
  Administered 2012-05-17 – 2012-05-18 (×4): 50 mg via ORAL
  Filled 2012-05-17 (×4): qty 1

## 2012-05-17 MED ORDER — LORATADINE 10 MG PO TABS
10.0000 mg | ORAL_TABLET | Freq: Every day | ORAL | Status: DC
  Administered 2012-05-18: 10 mg via ORAL
  Filled 2012-05-17 (×2): qty 1

## 2012-05-17 MED ORDER — LEVETIRACETAM 750 MG PO TABS
1500.0000 mg | ORAL_TABLET | Freq: Two times a day (BID) | ORAL | Status: DC
  Administered 2012-05-17 – 2012-05-18 (×2): 1500 mg via ORAL
  Filled 2012-05-17 (×6): qty 2

## 2012-05-17 MED ORDER — SODIUM CHLORIDE 0.9 % IV SOLN
Freq: Once | INTRAVENOUS | Status: AC
  Administered 2012-05-17: via INTRAVENOUS

## 2012-05-17 MED ORDER — POTASSIUM PHOSPHATE MONOBASIC 500 MG PO TABS
500.0000 mg | ORAL_TABLET | Freq: Three times a day (TID) | ORAL | Status: DC
  Filled 2012-05-17: qty 1

## 2012-05-17 MED ORDER — LEVOTHYROXINE SODIUM 50 MCG PO TABS
50.0000 ug | ORAL_TABLET | Freq: Every day | ORAL | Status: DC
  Administered 2012-05-17 – 2012-05-18 (×2): 50 ug via ORAL
  Filled 2012-05-17 (×3): qty 1

## 2012-05-17 MED ORDER — WARFARIN - PHARMACIST DOSING INPATIENT
Freq: Every day | Status: DC
  Administered 2012-05-17: 18:00:00

## 2012-05-17 MED ORDER — ACETAMINOPHEN 325 MG PO TABS
650.0000 mg | ORAL_TABLET | Freq: Four times a day (QID) | ORAL | Status: DC | PRN
  Administered 2012-05-17: 650 mg via ORAL
  Filled 2012-05-17: qty 2

## 2012-05-17 MED ORDER — AMOXICILLIN-POT CLAVULANATE 875-125 MG PO TABS
1.0000 | ORAL_TABLET | Freq: Two times a day (BID) | ORAL | Status: DC
  Administered 2012-05-17 – 2012-05-18 (×3): 1 via ORAL
  Filled 2012-05-17 (×4): qty 1

## 2012-05-17 MED ORDER — CARBAMAZEPINE 100 MG PO CHEW
200.0000 mg | CHEWABLE_TABLET | Freq: Three times a day (TID) | ORAL | Status: DC
  Administered 2012-05-17 – 2012-05-18 (×5): 200 mg via ORAL
  Filled 2012-05-17 (×7): qty 2

## 2012-05-17 MED ORDER — SODIUM CHLORIDE 0.9 % IJ SOLN
3.0000 mL | Freq: Two times a day (BID) | INTRAMUSCULAR | Status: DC
  Administered 2012-05-17: 3 mL via INTRAVENOUS

## 2012-05-17 NOTE — ED Provider Notes (Signed)
I saw and evaluated the patient, reviewed the resident's note and I agree with the findings and plan.  Complex seizure disorder history with recent port a cath placement at [facility].  Increasing partial sensory seizures today requiring 10 mg of valium. Recent tonic clonic break through seizures while at [facility] requiring phenobarbital.  No seizures in ED. Vitals stable, no distress.  Dysconjugate gaze with nystagmus. CBZ slightly elevated.  Neurology recommends dose adjustments and admission for observation.  Glynn Octave, MD 05/17/12 1150

## 2012-05-17 NOTE — Progress Notes (Signed)
Pt spiked temp of 102.5.  Medicated with 650mg  of Tylenol.  Notified Dr. David Stall of temp and new order placed for blood cultures x 2.

## 2012-05-17 NOTE — H&P (Signed)
[name] is an 26 y.o. male.  Patient was seen and examined on May 17, 2012 at 1:15 AM. PCP - Dr. Cyndia Bent at [facility] family practice. Neurologist - Dr. Sharene Skeans.  Chief Complaint: Seizures. HPI: 26 year old male with known history of seizures was brought to the ER by patient's mother after patient was complaining of multiple sensory seizures. The seizures were aborted after patient was given diazepam 10 mg IM by mother. Patient was brought to the ER. He did not have any further seizures after coming to the ER. His carbamazepine levels are found to be supratherapeutic. Neurologist on call Dr. Petra Kuba has seen the patient and at this time recommended observation and to hold off evening dose of carbamazepine and restart in a.m. after checking the levels at lower dose. In addition chest x-ray shows features of aspiration. Patient mother says patient has been having frequent diarrhea watery for last few days. [name] and at that time patient had multiple seizures including tonic-clonic. The seizures were finally aborted after giving phenobarbital. Patient was admitted and discharged 2 days ago.  Past Medical History  Diagnosis Date  . ALLERGIC RHINITIS 12/26/2009  . MITRAL VALVE PROLAPSE 06/22/2007  . SEIZURE DISORDER 06/22/2007  . Unspecified hypothyroidism 06/22/2007  . Growth disorder   . Mental retardation, mild (I.Q. 50-70)   . Brown's syndrome   . Heart murmur     moderate MR  . Sleep apnea     cpap , last sleep study 10/01/11  . Hypokalemia   . Atrial fibrillation     During port removal and attempted placement in July 2013, transientt  . Hyperlipidemia     No therapy    Past Surgical History  Procedure Date  . Implantation vagal nerve stimulator     Left  . Portacath placement     and removal 04/08/12  . Eye surgery     X2 for Brown Syndrome    Family History  Problem Relation Age of Onset    . Breast cancer Mother   . Asthma Father   . Diabetes Father   . Coronary artery disease Maternal Grandfather   . Coronary artery disease Paternal Grandfather    Social History:  reports that he has never smoked. He has never used smokeless tobacco. He reports that he does not drink alcohol or use illicit drugs.  Allergies:  Allergies  Allergen Reactions  . Divalproex Sodium Other (See Comments)    Nausea, vomiting, liver and kidney dysfunction  . Phenytoin Rash    Hypersensitivity  reaction  . Vancomycin Rash    Redman's Syndrome     (Not in a hospital admission)  Results for orders placed during the hospital encounter of 05/16/12 (from the past 48 hour(s))  CBC WITH DIFFERENTIAL     Status: Abnormal   Collection Time   05/16/12  9:44 PM      Component Value Range Comment   WBC 8.2  4.0 - 10.5 K/uL    RBC 3.74 (*) 4.22 - 5.81 MIL/uL    Hemoglobin 10.6 (*) 13.0 - 17.0 g/dL    HCT 40.1 (*) 02.7 - 52.0 %    MCV 82.6  78.0 - 100.0 fL    MCH 28.3  26.0 - 34.0 pg    MCHC 34.3  30.0 - 36.0 g/dL    RDW 25.3  66.4 - 40.3 %    Platelets 228  150 - 400 K/uL  Neutrophils Relative 67  43 - 77 %    Neutro Abs 5.4  1.7 - 7.7 K/uL    Lymphocytes Relative 23  12 - 46 %    Lymphs Abs 1.9  0.7 - 4.0 K/uL    Monocytes Relative 10  3 - 12 %    Monocytes Absolute 0.9  0.1 - 1.0 K/uL    Eosinophils Relative 0  0 - 5 %    Eosinophils Absolute 0.0  0.0 - 0.7 K/uL    Basophils Relative 0  0 - 1 %    Basophils Absolute 0.0  0.0 - 0.1 K/uL   CARBAMAZEPINE LEVEL, TOTAL     Status: Abnormal   Collection Time   05/16/12  9:44 PM      Component Value Range Comment   Carbamazepine Lvl 12.8 (*) 4.0 - 12.0 ug/mL   APTT     Status: Abnormal   Collection Time   05/16/12  9:44 PM      Component Value Range Comment   aPTT 57 (*) 24 - 37 seconds   PROTIME-INR     Status: Abnormal   Collection Time   05/16/12  9:44 PM      Component Value Range Comment   Prothrombin Time 28.1 (*) 11.6 - 15.2  seconds    INR 2.58 (*) 0.00 - 1.49   URINALYSIS, ROUTINE W REFLEX MICROSCOPIC     Status: Abnormal   Collection Time   05/16/12  9:48 PM      Component Value Range Comment   Color, Urine YELLOW  YELLOW    APPearance CLOUDY (*) CLEAR    Specific Gravity, Urine 1.015  1.005 - 1.030    pH 8.0  5.0 - 8.0    Glucose, UA 250 (*) NEGATIVE mg/dL    Hgb urine dipstick TRACE (*) NEGATIVE    Bilirubin Urine NEGATIVE  NEGATIVE    Ketones, ur NEGATIVE  NEGATIVE mg/dL    Protein, ur NEGATIVE  NEGATIVE mg/dL    Urobilinogen, UA 1.0  0.0 - 1.0 mg/dL    Nitrite NEGATIVE  NEGATIVE    Leukocytes, UA NEGATIVE  NEGATIVE   URINE MICROSCOPIC-ADD ON     Status: Abnormal   Collection Time   05/16/12  9:48 PM      Component Value Range Comment   Squamous Epithelial / LPF RARE  RARE    RBC / HPF 0-2  <3 RBC/hpf    Bacteria, UA FEW (*) RARE    Urine-Other AMORPHOUS MATERIAL PRESENT     POCT I-STAT, CHEM 8     Status: Abnormal   Collection Time   05/16/12 10:16 PM      Component Value Range Comment   Sodium 139  135 - 145 mEq/L    Potassium 3.2 (*) 3.5 - 5.1 mEq/L    Chloride 109  96 - 112 mEq/L    BUN 3 (*) 6 - 23 mg/dL    Creatinine, Ser 7.84  0.50 - 1.35 mg/dL    Glucose, Bld 696 (*) 70 - 99 mg/dL    Calcium, Ion 2.95  2.84 - 1.23 mmol/L    TCO2 17  0 - 100 mmol/L    Hemoglobin 12.6 (*) 13.0 - 17.0 g/dL    HCT 13.2 (*) 44.0 - 52.0 %    Dg Chest Portable 1 View  05/16/2012  *RADIOLOGY REPORT*  Clinical Data: Cough.  Seizures.  PORTABLE CHEST - 1 VIEW  Comparison: Plain film of the chest 04/10/2012.  Findings: [name] is in place with the tip projecting over the lower superior vena cava.  Neural stimulator device on the left is again seen.  The patient is rotated on the study.  There is some hazy airspace opacity in the right lung base.  Left lung is clear. No pneumothorax.  Heart size normal.  IMPRESSION: Hazy right basilar airspace disease could be due to atelectasis, aspiration pneumonia.    Original Report Authenticated By: Bernadene Bell. Maricela Curet, M.D.     Review of Systems  Constitutional: Negative.   HENT: Negative.   Eyes: Negative.   Respiratory: Negative.   Cardiovascular: Negative.   Gastrointestinal: Negative.   Genitourinary: Negative.   Musculoskeletal: Negative.   Skin: Negative.   Neurological: Positive for seizures.  Endo/Heme/Allergies: Negative.   Psychiatric/Behavioral: Negative.     Blood pressure 99/58, pulse 96, temperature 97.7 F (36.5 C), temperature source Oral, resp. rate 29, height 5\' 3"  (1.6 m), weight 43.545 kg (96 lb), SpO2 100.00%. Physical Exam  Constitutional: He is oriented to person, place, and time. He appears well-developed and well-nourished. No distress.  HENT:  Head: Normocephalic and atraumatic.  Right Ear: External ear normal.  Left Ear: External ear normal.  Nose: Nose normal.  Mouth/Throat: Oropharynx is clear and moist. No oropharyngeal exudate.  Eyes: Conjunctivae are normal. Pupils are equal, round, and reactive to light. Right eye exhibits no discharge. Left eye exhibits no discharge. No scleral icterus.  Neck: Normal range of motion. Neck supple.  Cardiovascular: Normal rate and regular rhythm.   Respiratory: Effort normal and breath sounds normal. No respiratory distress. He has no wheezes. He has no rales.  GI: Soft. Bowel sounds are normal. He exhibits no distension. There is no tenderness. There is no rebound.  Musculoskeletal: Normal range of motion. He exhibits no edema and no tenderness.  Neurological: He is alert and oriented to person, place, and time. Cranial nerve deficit: Moves all extremities.  Skin: Skin is warm and dry. He is not diaphoretic.     Assessment/Plan #1. Seizures - please see neurologists notes. At this time we will be rechecking carbamazepine levels in a.m. And if levels are within normal then start carbamazepine at 200 mg twice a day. Continue other anti-seizure medications. #2. Possible  aspiration - patient has been placed on Unasyn for now. Aspiration possibly happened during seizures. #3. Diarrhea - check stool for C. difficile and culture. #4. Non-anion get metabolic acidosis - probably from diarrhea. Gently hydrate and recheck metabolic panel. #5. History of left subclavian DVT -  Coumadin per pharmacy. #6. Hypothyroidism - continue Synthroid.  CODE STATUS - full code.  Javen Hinderliter N. 05/17/2012, 1:43 AM

## 2012-05-17 NOTE — Progress Notes (Addendum)
[facility] PROGRESS NOTE  [name] XBJ:[id number] DOB: Jun 13, 1986 DOA: 05/16/2012   Assessment/Plan: [name] (05/17/2012) -held dose of tegretol, resumed at a lower dose. -appreciate neuro assistance.  SEIZURE DISORDER (06/22/2007) -no events since admission appreciate neurology assistance.  DVT of upper extremity (deep vein thrombosis) (09/04/2011) -INR therapeutic.  Aspiration pneumonia (05/17/2012) -continue Unasyn IV change to Augmentin. -respiration continue to improve. -sating 99% on RA.  Diarrhea (05/17/2012) -c.dif negative, now improving.  Hypokalemia: -replete check a mag. level, b-met in am    Code Status: full Family Communication: mother Disposition Plan: home in am     LOS: 1 day   Procedures:  none  Antibiotics:  unasyn  augmentin  Interim History:   Subjective: Mother notice respiration here faster than normal  Objective: Filed Vitals:   05/16/12 2300 05/17/12 0100 05/17/12 0150 05/17/12 0423  BP: 99/58 97/61 97/61  109/74  Pulse: 96 101 102 114  Temp:    97.7 F (36.5 C)  TempSrc:    Oral  Resp: 29 27 28 26   Height:      Weight:      SpO2: 100% 100% 100% 99%    Intake/Output Summary (Last 24 hours) at 05/17/12 1255 Last data filed at 05/17/12 1120  Gross per 24 hour  Intake    880 ml  Output    601 ml  Net    279 ml   Weight change:   Exam:  General: Alert,  in no acute distress.  HEENT: No bruits, no goiter.  Heart: Regular rate and rhythm, without murmurs, rubs, gallops.  Lungs: Good air movement, crackles on the right Abdomen: Soft, nontender, nondistended, positive bowel sounds.     Data Reviewed: Basic Metabolic Panel:  Lab 05/17/12 3086 05/16/12 2216  NA 136 139  K 3.1* 3.2*  CL 106 109  CO2 18* --  GLUCOSE 98 119*  BUN 5* 3*  CREATININE 0.55 0.80  CALCIUM 8.3* --  MG -- --  PHOS -- --   Liver Function Tests:  Lab 05/17/12 0535  AST 13  ALT 12  ALKPHOS 71  BILITOT 0.3    PROT 6.5  ALBUMIN 2.5*   No results found for this basename: LIPASE:5,AMYLASE:5 in the last 168 hours No results found for this basename: AMMONIA:5 in the last 168 hours CBC:  Lab 05/17/12 0535 05/16/12 2216 05/16/12 2144  WBC 6.5 -- 8.2  NEUTROABS 4.3 -- 5.4  HGB 9.6* 12.6* 10.6*  HCT 28.4* 37.0* 30.9*  MCV 83.3 -- 82.6  PLT 195 -- 228   Cardiac Enzymes: No results found for this basename: CKTOTAL:5,CKMB:5,CKMBINDEX:5,TROPONINI:5 in the last 168 hours BNP: No components found with this basename: POCBNP:5 CBG: No results found for this basename: GLUCAP:5 in the last 168 hours  No results found for this or any previous visit (from the past 240 hour(s)).   Studies: Dg Chest Portable 1 View  05/16/2012  *RADIOLOGY REPORT*  Clinical Data: Cough.  Seizures.  PORTABLE CHEST - 1 VIEW  Comparison: Plain film of the chest 04/10/2012.  Findings: [name] is in place with the tip projecting over the lower superior vena cava.  Neural stimulator device on the left is again seen.  The patient is rotated on the study.  There is some hazy airspace opacity in the right lung base.  Left lung is clear. No pneumothorax.  Heart size normal.  IMPRESSION: Hazy right basilar airspace disease could be due to atelectasis, aspiration pneumonia.   Original Report Authenticated By: Bernadene Bell.  D'ALESSIO, M.D.     Scheduled Meds:    . sodium chloride   Intravenous Once  . ampicillin-sulbactam (UNASYN) IV  3 g Intravenous Q6H  . carbamazepine  200 mg Oral Q8H  . lacosamide  50 mg Oral TID  . levETIRAcetam  1,500 mg Oral BID  . levothyroxine  50 mcg Oral Q breakfast  . loratadine  10 mg Oral Daily  . LORazepam  2 mg Oral Once  . phosphorus  500 mg Oral TID AC & HS  . potassium chloride SA  20 mEq Oral TID AC & HS  . potassium chloride  40 mEq Oral BID  . sodium chloride  3 mL Intravenous Q12H  . topiramate  150 mg Oral TID  . warfarin  5 mg Oral q1800  . Warfarin - Pharmacist Dosing Inpatient   Does  not apply q1800  . DISCONTD: lacosamide  50 mg Oral TID  . DISCONTD: levETIRAcetam  1,500 mg Oral BID  . DISCONTD: potassium phosphate (monobasic)  500 mg Oral TID AC & HS  . DISCONTD: topiramate  150 mg Oral TID   Continuous Infusions:    . 0.45 % NaCl with KCl 20 mEq / L 75 mL/hr at 05/17/12 0311    Lambert Keto, MD  [facility] Pager [phone]  If 7PM-7AM, please contact night-coverage [email or web address] Password [email or web address] 05/17/2012, 12:55 PM

## 2012-05-17 NOTE — Progress Notes (Signed)
ANTICOAGULATION CONSULT NOTE - Initial Consult  Pharmacy Consult for Coumadin Indication: h/o DVT  Allergies  Allergen Reactions  . Divalproex Sodium Other (See Comments)    Nausea, vomiting, liver and kidney dysfunction  . Phenytoin Rash    Hypersensitivity  reaction  . Vancomycin Rash    Redman's Syndrome    Patient Measurements: Height: 5\' 3"  (160 cm) Weight: 96 lb (43.545 kg) IBW/kg (Calculated) : 56.9   Vital Signs: Temp: 97.7 F (36.5 C) (08/25 2233) Temp src: Oral (08/25 2233) BP: 97/61 mmHg (08/26 0150) Pulse Rate: 102  (08/26 0150)  Labs:  Basename 05/16/12 2216 05/16/12 2144  HGB 12.6* 10.6*  HCT 37.0* 30.9*  PLT -- 228  APTT -- 57*  LABPROT -- 28.1*  INR -- 2.58*  HEPARINUNFRC -- --  CREATININE 0.80 --  CKTOTAL -- --  CKMB -- --  TROPONINI -- --    Estimated Creatinine Clearance: 87.6 ml/min (by C-G formula based on Cr of 0.8).   Medical History: Past Medical History  Diagnosis Date  . ALLERGIC RHINITIS 12/26/2009  . MITRAL VALVE PROLAPSE 06/22/2007  . SEIZURE DISORDER 06/22/2007  . Unspecified hypothyroidism 06/22/2007  . Growth disorder   . Mental retardation, mild (I.Q. 50-70)   . Brown's syndrome   . Heart murmur     moderate MR  . Sleep apnea     cpap , last sleep study 10/01/11  . Hypokalemia   . Atrial fibrillation     During port removal and attempted placement in July 2013, transientt  . Hyperlipidemia     No therapy    Medications:  Prescriptions prior to admission  Medication Sig Dispense Refill  . carbamazepine (TEGRETOL) 100 MG chewable tablet Chew 250 mg by mouth 3 (three) times daily. Take at 7am, 1:30pm, and 9pm.      . cetirizine (ZYRTEC) 10 MG tablet Take 10 mg by mouth every morning.      . diazepam (VALIUM) 2 MG tablet Take 2 mg by mouth as needed. Take as needed when having sensation seizure.       . Diazepam 10 MG/2ML DEVI Inject 10 mg into the muscle daily as needed. For seizures.      . Lacosamide (VIMPAT) 10 MG/ML  SOLN Take 5 mLs by mouth 3 (three) times daily. 7am 130p and 9pm      . levETIRAcetam (KEPPRA) 500 MG tablet Take 1,500 mg by mouth 2 (two) times daily.      Marland Kitchen levothyroxine (SYNTHROID, LEVOTHROID) 50 MCG tablet Take 50 mcg by mouth every morning.       Marland Kitchen LORazepam (ATIVAN) 2 MG/ML concentrated solution Take 2 mg by mouth every 8 (eight) hours as needed. It would be given by IV      . phenobarbital (LUMINAL) 60 MG/ML injection Inject 65 mg into the vein daily as needed. For seizures      . potassium chloride SA (K-DUR,KLOR-CON) 20 MEQ tablet Take 20 mEq by mouth 4 (four) times daily.      . potassium phosphate, monobasic, (K-PHOS) 500 MG tablet Take 500 mg by mouth 4 (four) times daily. Take at 7a, 130p, 5p, and 9p      . topiramate (TOPAMAX) 100 MG tablet Take 150 mg by mouth 3 (three) times daily. Take at 7am, 1:30pm, and 9pm.      . warfarin (COUMADIN) 5 MG tablet Take 5 mg by mouth daily at 6 PM.        Assessment: 26 yo male admitted with  seizures, h/o L subclavian DVT, for Coumadin  Goal of Therapy:  INR 2-3   Plan:  Continue home regimen  Daily INR  Ahaan Zobrist, Gary Fleet 05/17/2012,2:53 AM

## 2012-05-17 NOTE — Progress Notes (Signed)
Subjective: No recurrent seizure activity since yesterday afternoon prior to coming to the emergency room. Symptoms of [name] and improved including lightheadedness and nystagmus.  Objective: Current vital signs: BP 109/74  Pulse 114  Temp 97.7 F (36.5 C) (Oral)  Resp 26  Ht 5\' 3"  (1.6 m)  Wt 43.545 kg (96 lb)  BMI 17.01 kg/m2  SpO2 99%  Neurologic Exam: Alert and in no acute distress. Speech was normal. Extraocular movements were full and conjugate no nystagmus.  No manifestations of focal seizure activity were noted. Moves extremities well and equally.  Lab Results: Tegretol level today was 9.5 INR was 2.96 this morning   Medications:  Scheduled:   . sodium chloride   Intravenous Once  . ampicillin-sulbactam (UNASYN) IV  3 g Intravenous Q6H  . carbamazepine  200 mg Oral Q8H  . lacosamide  50 mg Oral TID  . levETIRAcetam  1,500 mg Oral BID  . levothyroxine  50 mcg Oral Q breakfast  . loratadine  10 mg Oral Daily  . LORazepam  2 mg Oral Once  . phosphorus  500 mg Oral TID AC & HS  . potassium chloride SA  20 mEq Oral TID AC & HS  . potassium chloride  40 mEq Oral BID  . sodium chloride  3 mL Intravenous Q12H  . topiramate  150 mg Oral TID  . warfarin  5 mg Oral q1800  . Warfarin - Pharmacist Dosing Inpatient   Does not apply q1800  . DISCONTD: lacosamide  50 mg Oral TID  . DISCONTD: levETIRAcetam  1,500 mg Oral BID  . DISCONTD: potassium phosphate (monobasic)  500 mg Oral TID AC & HS  . DISCONTD: topiramate  150 mg Oral TID   ZOX:[id number], acetaminophen, LORazepam, ondansetron (ZOFRAN) IV, ondansetron  Assessment/Plan: Breakthrough seizures on 05/16/2012. Etiology is unclear. There is no indication of an intercurrent illness such as infection. Tegretol level was supratherapeutic and patient had clinical signs of [name], which have now resolved.  Plan: 1. Tegretol reduced to 200 mg 3 times per day, from 250 mg 3 times a day. 2. No  change in Keppra 1500 mg twice a day 3. Vimpat to continue at 50 mg 3 times per day 4. Coumadin to continue at 5 mg per day 5. Will touch basis with Dr. Sharene Skeans and possibly discharge patient home today.  C.R. Roseanne Reno, MD [facility] [phone]  05/17/2012  10:17 AM

## 2012-05-18 LAB — BASIC METABOLIC PANEL
CO2: 18 mEq/L — ABNORMAL LOW (ref 19–32)
Chloride: 109 mEq/L (ref 96–112)
Creatinine, Ser: 0.58 mg/dL (ref 0.50–1.35)
Potassium: 3.6 mEq/L (ref 3.5–5.1)

## 2012-05-18 LAB — PROTIME-INR: INR: 2.63 — ABNORMAL HIGH (ref 0.00–1.49)

## 2012-05-18 MED ORDER — AMOXICILLIN-POT CLAVULANATE 875-125 MG PO TABS: 1.0000 | ORAL_TABLET | Freq: Two times a day (BID) | ORAL | Status: AC

## 2012-05-18 MED ORDER — ENSURE COMPLETE PO LIQD
237.0000 mL | Freq: Two times a day (BID) | ORAL | Status: DC
  Administered 2012-05-18: 237 mL via ORAL

## 2012-05-18 MED ORDER — SODIUM CHLORIDE 0.9 % IJ SOLN
10.0000 mL | INTRAMUSCULAR | Status: DC | PRN
  Administered 2012-05-18 (×2): 10 mL

## 2012-05-18 MED ORDER — CARBAMAZEPINE 100 MG PO CHEW: 200.0000 mg | CHEWABLE_TABLET | Freq: Three times a day (TID) | ORAL | Status: DC

## 2012-05-18 MED ORDER — HEPARIN SOD (PORK) LOCK FLUSH 100 UNIT/ML IV SOLN
500.0000 [IU] | INTRAVENOUS | Status: AC | PRN
  Administered 2012-05-18: 500 [IU]

## 2012-05-18 NOTE — Discharge Summary (Signed)
Physician Discharge Summary  [name] QMV:[id number] DOB: 01/17/1986 DOA: 05/16/2012  PCP: Eartha Inch, MD  Admit date: 05/16/2012 Discharge date: 05/18/2012  Discharge Diagnoses:  Principal Problem:  *[name] Active Problems:  SEIZURE DISORDER  DVT of upper extremity (deep vein thrombosis)  Aspiration pneumonia  Diarrhea   Discharge Condition: stable  Disposition:  Follow-up Information    Follow up with BADGER,MICHAEL C, MD in 2 weeks. (hospital follow up)    Contact information:   [address] [place] [address] [phone]       Follow up with Deetta Perla, MD in 4 weeks. (hospital follow up)    Contact information:   [address] [facility] [place] [address] [phone]         Diet:regular Wt Readings from Last 3 Encounters:  05/16/12 43.545 kg (96 lb)  04/29/12 43.092 kg (95 lb)  04/09/12 44.997 kg (99 lb 3.2 oz)    History of present illness:  26 year old male with known history of seizures was brought to the ER by patient's mother after patient was complaining of multiple sensory seizures. The seizures were aborted after patient was given diazepam 10 mg IM by mother. Patient was brought to the ER. He did not have any further seizures after coming to the ER. His carbamazepine levels are found to be supratherapeutic. Neurologist on call Dr. Petra Kuba has seen the patient and at this time recommended observation and to hold off evening dose of carbamazepine and restart in a.m. after checking the levels at lower dose. In addition chest x-ray shows features of aspiration. Patient mother says patient has been having frequent diarrhea watery for last few days.  [name] and at that time patient had multiple seizures including tonic-clonic. The seizures were finally aborted after giving  phenobarbital. Patient was admitted and discharged 2 days ago   Hospital Course:  Principal Problem: [name] (05/17/2012) -held dose of tegretol on admission, resumed at a lower dose.  -Neurology was consulted they agreed with recommendations. His neurologist Dr. Sharene Skeans was consulted which also agreed. -He'll follow up with Dr. Sharene Skeans in 4- 5 weeks.  Aspiration pneumonia (05/17/2012) -admitted to the hospital start her on Unasyn IV, then changed to Augmentin he defervesced he was tolerating this well. He'll continue Augmentin for 7 more days.  SEIZURE DISORDER (06/22/2007) -no events since admission.  DVT of upper extremity (deep vein thrombosis) (09/04/2011) -INR therapeutic.   Diarrhea (05/17/2012) -the family related that he is having some diarrhea, c.dif check negative, no further diarrhea throughout his hospital stay.  Hypokalemia:  -Unclear etiology, question secondary to diarrhea now resolved after repletion.   Discharge Exam: Filed Vitals:   05/18/12 0500  BP: 93/65  Pulse: 95  Temp: 98.3 F (36.8 C)  Resp: 18   Filed Vitals:   05/17/12 1621 05/17/12 1908 05/17/12 2000 05/18/12 0500  BP:   94/66 93/65  Pulse:   101 95  Temp: 99.6 F (37.6 C) 98.5 F (36.9 C) 99 F (37.2 C) 98.3 F (36.8 C)  TempSrc: Oral Oral  Oral  Resp:   16 18  Height:      Weight:      SpO2:   97% 97%   General: Awake alert and oriented x1 in no acute distress Cardiovascular: Regular rate and rhythm Respiratory: Good air movement and clear to auscultation  Discharge Instructions  Discharge Orders  Future Orders Please Complete By Expires   Diet - low sodium heart healthy      Increase activity slowly        Medication List  As of 05/18/2012 11:51 AM   TAKE these medications         carbamazepine 100 MG chewable tablet   Commonly known as: TEGRETOL   Chew 2 tablets (200 mg total) by mouth every 8 (eight) hours.      cetirizine 10 MG tablet   Commonly known as:  ZYRTEC   Take 10 mg by mouth every morning.      diazepam 2 MG tablet   Commonly known as: VALIUM   Take 2 mg by mouth as needed. Take as needed when having sensation seizure.      Diazepam 10 MG/2ML Devi   Inject 10 mg into the muscle daily as needed. For seizures.      levETIRAcetam 500 MG tablet   Commonly known as: KEPPRA   Take 1,500 mg by mouth 2 (two) times daily.      levothyroxine 50 MCG tablet   Commonly known as: SYNTHROID, LEVOTHROID   Take 50 mcg by mouth every morning.      LORazepam 2 MG/ML concentrated solution   Commonly known as: ATIVAN   Take 2 mg by mouth every 8 (eight) hours as needed. It would be given by IV      phenobarbital 60 MG/ML injection   Commonly known as: LUMINAL   Inject 65 mg into the vein daily as needed. For seizures      potassium chloride SA 20 MEQ tablet   Commonly known as: K-DUR,KLOR-CON   Take 20 mEq by mouth 4 (four) times daily.      potassium phosphate (monobasic) 500 MG tablet   Commonly known as: K-PHOS ORIGINAL   Take 500 mg by mouth 4 (four) times daily. Take at 7a, 130p, 5p, and 9p      TOPAMAX 100 MG tablet   Generic drug: topiramate   Take 150 mg by mouth 3 (three) times daily. Take at 7am, 1:30pm, and 9pm.      VIMPAT 10 MG/ML Soln   Generic drug: Lacosamide   Take 5 mLs by mouth 3 (three) times daily. 7am 130p and 9pm      warfarin 5 MG tablet   Commonly known as: COUMADIN   Take 5 mg by mouth daily at 6 PM.              The results of significant diagnostics from this hospitalization (including imaging, microbiology, ancillary and laboratory) are listed below for reference.    Significant Diagnostic Studies: Dg Chest Portable 1 View  05/16/2012  *RADIOLOGY REPORT*  Clinical Data: Cough.  Seizures.  PORTABLE CHEST - 1 VIEW  Comparison: Plain film of the chest 04/10/2012.  Findings: [name] is in place with the tip projecting over the lower superior vena cava.  Neural stimulator device on the left is  again seen.  The patient is rotated on the study.  There is some hazy airspace opacity in the right lung base.  Left lung is clear. No pneumothorax.  Heart size normal.  IMPRESSION: Hazy right basilar airspace disease could be due to atelectasis, aspiration pneumonia.   Original Report Authenticated By: Bernadene Bell. Maricela Curet, M.D.     Microbiology: Recent Results (from the past 240 hour(s))  CLOSTRIDIUM DIFFICILE BY PCR     Status: Normal   Collection Time   05/17/12 11:30 AM  Component Value Range Status Comment   C difficile by pcr NEGATIVE  NEGATIVE Final   CULTURE, BLOOD (ROUTINE X 2)     Status: Normal (Preliminary result)   Collection Time   05/17/12  3:45 PM      Component Value Range Status Comment   Specimen Description BLOOD HAND RIGHT   Final    Special Requests BOTTLES DRAWN AEROBIC ONLY 7CC   Final    Culture  Setup Time 05/17/2012 23:41   Final    Culture     Final    Value:        BLOOD CULTURE RECEIVED NO GROWTH TO DATE CULTURE WILL BE HELD FOR 5 DAYS BEFORE ISSUING A FINAL NEGATIVE REPORT   Report Status PENDING   Incomplete   CULTURE, BLOOD (ROUTINE X 2)     Status: Normal (Preliminary result)   Collection Time   05/17/12  3:52 PM      Component Value Range Status Comment   Specimen Description BLOOD ARM RIGHT   Final    Special Requests BOTTLES DRAWN AEROBIC AND ANAEROBIC 10CC   Final    Culture  Setup Time 05/17/2012 23:41   Final    Culture     Final    Value:        BLOOD CULTURE RECEIVED NO GROWTH TO DATE CULTURE WILL BE HELD FOR 5 DAYS BEFORE ISSUING A FINAL NEGATIVE REPORT   Report Status PENDING   Incomplete      Labs: Basic Metabolic Panel:  Lab 05/18/12 9147 05/17/12 1311 05/17/12 0535 05/16/12 2216  NA 139 -- 136 139  K 3.6 -- 3.1* --  CL 109 -- 106 109  CO2 18* -- 18* --  GLUCOSE 109* -- 98 119*  BUN 5* -- 5* 3*  CREATININE 0.58 -- 0.55 0.80  CALCIUM 8.8 -- 8.3* --  MG -- 1.9 -- --  PHOS -- -- -- --   Liver Function Tests:  Lab 05/17/12  0535  AST 13  ALT 12  ALKPHOS 71  BILITOT 0.3  PROT 6.5  ALBUMIN 2.5*   No results found for this basename: LIPASE:5,AMYLASE:5 in the last 168 hours No results found for this basename: AMMONIA:5 in the last 168 hours CBC:  Lab 05/17/12 0535 05/16/12 2216 05/16/12 2144  WBC 6.5 -- 8.2  NEUTROABS 4.3 -- 5.4  HGB 9.6* 12.6* 10.6*  HCT 28.4* 37.0* 30.9*  MCV 83.3 -- 82.6  PLT 195 -- 228   Cardiac Enzymes: No results found for this basename: CKTOTAL:5,CKMB:5,CKMBINDEX:5,TROPONINI:5 in the last 168 hours BNP: No components found with this basename: POCBNP:5 CBG: No results found for this basename: GLUCAP:5 in the last 168 hours  Time coordinating discharge: Greater than 30 minutes  Signed:  Marinda Elk  [facility] 05/18/2012, 11:51 AM

## 2012-05-18 NOTE — Progress Notes (Signed)
Patient ID: [name], male   DOB: 1985-11-11, 26 y.o.   MRN: [id number] Neurology Hospital Progress Note  Patient name: [name] Medical record number: [id number] Date of birth: 12/26/1985 Age: 26 y.o. Gender: male    LOS: 2 days   Primary Care Provider: Eartha Inch, MD  Overnight Events: Marcus Fus has not experienced more seizures since he was hospitalized.  His temperature spike to 102.58F yesterday and he is being treated for possible aspiration pneumonia.  The reason for her elevated carbamazepine levels is unclear.  He has been given 250 mg 3 times daily for years with infrequent toxicity.  His parents are hard to recognize the signs of it.  The brought into the hospital because he not only had a cluster seizures that failed to respond to the diazepam autoinjector, but they thought he was showing signs of eye rolling and head-nodding that they have come to recognize as [name].  It's unclear to me if this was ictal or postictal.  He recently had his [name] removed because it was causing venous emboli that affected R. venous return left greater than right.  This caused the use of Coumadin in order to prevent pulmonary embolus.  He was also necessary to reestablish venous return that was causing significant swelling of his left face, neck, arm, and upper torso.  We stopped Coumadin after his most recent imaging at [facility] because there did not appear to be any clot.  This was necessary to remove his [name].  Coumadin was not restarted following the procedure.  He went to [facility] and a new [name] was placed.  During imaging they found evidence of new clot and he was placed back on Coumadin.  The family uses the [name] for IV access to give him lorazepam and phenobarbital when the diazepam autoinjector fails to stop his seizures.  Objective: Vital signs in last 24 hours: Temp:  [98.3 F (36.8 C)-102.5 F (39.2 C)] 98.3 F (36.8 C) (08/27  0500) Pulse Rate:  [88-101] 95  (08/27 0500) Resp:  [16-21] 18  (08/27 0500) BP: (93-104)/(65-71) 93/65 mmHg (08/27 0500) SpO2:  [97 %] 97 % (08/27 0500)  Wt Readings from Last 3 Encounters:  05/16/12 43.545 kg (96 lb)  04/29/12 43.092 kg (95 lb)  04/09/12 44.997 kg (99 lb 3.2 oz)    Intake/Output Summary (Last 24 hours) at 05/18/12 0830 Last data filed at 05/18/12 0500  Gross per 24 hour  Intake 2348.5 ml  Output   1053 ml  Net 1295.5 ml    Current Facility-Administered Medications  Medication Dose Route Frequency Provider Last Rate Last Dose  . 0.45 % NaCl with KCl 20 mEq / L infusion   Intravenous Continuous Marinda Elk, MD 20 mL/hr at 05/17/12 1442    . acetaminophen (TYLENOL) tablet 650 mg  650 mg Oral Q6H PRN Eduard Clos, MD   650 mg at 05/17/12 1446   Or  . acetaminophen (TYLENOL) suppository 650 mg  650 mg Rectal Q6H PRN Eduard Clos, MD      . amoxicillin-clavulanate (AUGMENTIN) 875-125 MG per tablet 1 tablet  1 tablet Oral Q12H Marinda Elk, MD   1 tablet at 05/17/12 2242  . carbamazepine (TEGRETOL) chewable tablet 200 mg  200 mg Oral Q8H Ritta Slot, MD   200 mg at 05/18/12 0711  . lacosamide (VIMPAT) tablet 50 mg  50 mg Oral TID Eduard Clos, MD   50 mg at 05/18/12 0710  . levETIRAcetam (KEPPRA)  tablet 1,500 mg  1,500 mg Oral BID Ritta Slot, MD   1,500 mg at 05/18/12 0710  . levothyroxine (SYNTHROID, LEVOTHROID) tablet 50 mcg  50 mcg Oral Q breakfast Eduard Clos, MD   50 mcg at 05/18/12 0710  . loratadine (CLARITIN) tablet 10 mg  10 mg Oral Daily Eduard Clos, MD   10 mg at 05/18/12 0710  . LORazepam (ATIVAN) injection 1 mg  1 mg Intravenous Q4H PRN Eduard Clos, MD      . ondansetron [facility]) tablet 4 mg  4 mg Oral Q6H PRN Eduard Clos, MD       Or  . ondansetron [facility]) injection 4 mg  4 mg Intravenous Q6H PRN Eduard Clos, MD      . phosphorus (K PHOS NEUTRAL) tablet 500 mg   500 mg Oral TID AC & HS Eduard Clos, MD   500 mg at 05/18/12 0710  . potassium chloride SA (K-DUR,KLOR-CON) CR tablet 20 mEq  20 mEq Oral TID AC & HS Eduard Clos, MD   20 mEq at 05/18/12 0700  . potassium chloride SA (K-DUR,KLOR-CON) CR tablet 40 mEq  40 mEq Oral BID Marinda Elk, MD   40 mEq at 05/17/12 2242  . sodium chloride 0.9 % injection 10-40 mL  10-40 mL Intracatheter PRN Cristal Ford, MD   10 mL at 05/18/12 0455  . sodium chloride 0.9 % injection 3 mL  3 mL Intravenous Q12H Eduard Clos, MD   3 mL at 05/17/12 2200  . topiramate (TOPAMAX) tablet 150 mg  150 mg Oral TID Ritta Slot, MD   150 mg at 05/18/12 0710  . warfarin (COUMADIN) tablet 5 mg  5 mg Oral q1800 Eduard Clos, MD   5 mg at 05/17/12 1741  . Warfarin - Pharmacist Dosing Inpatient   Does not apply [id number] Eduard Clos, MD      . DISCONTD: Ampicillin-Sulbactam (UNASYN) 3 g in sodium chloride 0.9 % 100 mL IVPB  3 g Intravenous Q6H Eduard Clos, MD   3 g at 05/17/12 1111   PE:Not examined Gen: HEENT: CV: Res: Abd: Ext/Musc: Neuro:  Labs/Studies: Reviewed  Assessment/Plan: He has defervesced.  I suspect that he will be able to be discharged home today.  I would send him home on his current medications which include carbamazepine 200 mg by mouth 3 times a day, Keppra (brand name) 500 mg 3 by mouth 3 times a day, and Vimpat (brand name) 50 by mouth 3 times a day.  We will see him in followup within the next month.  If you questions, do not hesitate to contact me  Signed: Deetta Perla, MD Child neurology attending [phone] 05/18/2012 8:30 AM

## 2012-05-18 NOTE — Progress Notes (Signed)
ANTICOAGULATION CONSULT NOTE - Follow Up Consult  Pharmacy Consult for Coumadin Indication: h/o DVT  Allergies  Allergen Reactions  . Divalproex Sodium Other (See Comments)    Nausea, vomiting, liver and kidney dysfunction  . Phenytoin Rash    Hypersensitivity  reaction  . Vancomycin Rash    Redman's Syndrome   Vital Signs: Temp: 98.3 F (36.8 C) (08/27 0500) Temp src: Oral (08/27 0500) BP: 93/65 mmHg (08/27 0500) Pulse Rate: 95  (08/27 0500)  Labs:  [name] 05/18/12 0455 05/17/12 0535 05/16/12 2216 05/16/12 2144  HGB -- 9.6* 12.6* --  HCT -- 28.4* 37.0* 30.9*  PLT -- 195 -- 228  APTT -- -- -- 57*  LABPROT 28.5* 31.3* -- 28.1*  INR 2.63* 2.96* -- 2.58*  HEPARINUNFRC -- -- -- --  CREATININE 0.58 0.55 0.80 --  CKTOTAL -- -- -- --  CKMB -- -- -- --  TROPONINI -- -- -- --    Estimated Creatinine Clearance: 87.6 ml/min (by C-G formula based on Cr of 0.58).  Assessment: 25yom on coumadin pta for h/o DVT, admitted with a therapeutic INR and his home regimen of coumadin 5mg  daily was resumed. INR continues to be therapeutic. No CBC today. No bleeding noted.   Tegretol can increase the metabolism of coumadin, resulting in a decreased INR. Watch for drug interaction as the dose of tegretol has been decreased due to toxicity on admission.   Goal of Therapy:  INR 2-3  Plan:  1) Continue coumadin 5mg  daily for now 2) Follow up INR in AM   [name] 05/18/2012,8:46 AM

## 2012-05-18 NOTE — Progress Notes (Signed)
INITIAL ADULT NUTRITION ASSESSMENT Date: 05/18/2012   Time: 12:31 PM  INTERVENTION:  Ensure Complete twice daily between meals (350 kcals, 13 gm protein per 8 fl oz bottle) RD to follow for nutrition care plan  Reason for Assessment: Health Hx  ASSESSMENT: Male 26 y.o.  Dx: [name]  Hx:  Past Medical History  Diagnosis Date  . ALLERGIC RHINITIS 12/26/2009  . MITRAL VALVE PROLAPSE 06/22/2007  . SEIZURE DISORDER 06/22/2007  . Unspecified hypothyroidism 06/22/2007  . Growth disorder   . Mental retardation, mild (I.Q. 50-70)   . Brown's syndrome   . Heart murmur     moderate MR  . Sleep apnea     cpap , last sleep study 10/01/11  . Hypokalemia   . Atrial fibrillation     During port removal and attempted placement in July 2013, transientt  . Hyperlipidemia     No therapy    Related Meds:     . amoxicillin-clavulanate  1 tablet Oral Q12H  . carbamazepine  200 mg Oral Q8H  . lacosamide  50 mg Oral TID  . levETIRAcetam  1,500 mg Oral BID  . levothyroxine  50 mcg Oral Q breakfast  . loratadine  10 mg Oral Daily  . phosphorus  500 mg Oral TID AC & HS  . potassium chloride SA  20 mEq Oral TID AC & HS  . potassium chloride  40 mEq Oral BID  . sodium chloride  3 mL Intravenous Q12H  . topiramate  150 mg Oral TID  . warfarin  5 mg Oral q1800  . Warfarin - Pharmacist Dosing Inpatient   Does not apply q1800  . DISCONTD: ampicillin-sulbactam (UNASYN) IV  3 g Intravenous Q6H    Ht: 5\' 3"  (160 cm)  Wt: 96 lb (43.545 kg)  Ideal Wt: 56.3 kg  % Ideal Wt: 77%  Usual Wt: 96 lb % Usual Wt: 100%  Body mass index is 17.01 kg/(m^2).  Food/Nutrition Related Hx: no triggers per admission nutrition screen  Labs:  CMP     Component Value Date/Time   NA 139 05/18/2012 0455   K 3.6 05/18/2012 0455   CL 109 05/18/2012 0455   CO2 18* 05/18/2012 0455   GLUCOSE 109* 05/18/2012 0455   BUN 5* 05/18/2012 0455   CREATININE 0.58 05/18/2012 0455   CALCIUM 8.8 05/18/2012 0455   PROT  6.5 05/17/2012 0535   ALBUMIN 2.5* 05/17/2012 0535   AST 13 05/17/2012 0535   ALT 12 05/17/2012 0535   ALKPHOS 71 05/17/2012 0535   BILITOT 0.3 05/17/2012 0535   GFRNONAA >90 05/18/2012 0455   GFRAA >90 05/18/2012 0455     Intake/Output Summary (Last 24 hours) at 05/18/12 1234 Last data filed at 05/18/12 0700  Gross per 24 hour  Intake 1908.5 ml  Output    802 ml  Net 1106.5 ml    Diet Order: General  Supplements/Tube Feeding: N/A  IVF:    0.45 % NaCl with KCl 20 mEq / L Last Rate: 20 mL/hr at 05/17/12 1442    Estimated Nutritional Needs:   Kcal: 1300-1500 Protein: 65-75 gm Fluid: > 1.5 L  Patient with history of seizures was brought to the ER by patient's mother after patient was complaining of multiple sensory seizures; RD spoke to patient's parents; his appetite has been poor during his hospitalization; they feel his weight has been stable; meets criteria for underweight state; parents amenable to adding Ensure Complete supplement -- RD to order.  NUTRITION DIAGNOSIS: -Inadequate  oral intake (NI-2.1).  Status: Ongoing  RELATED TO: poor appetite  AS EVIDENCE BY: PO intake 10-50%  MONITORING/EVALUATION(Goals): Goal: Oral intake with meals & supplements to meet >/= 90% of estimated nutrition needs Monitor: PO & supplemental intake, weight, labs, I/O's  EDUCATION NEEDS: -No education needs identified at this time  Dietitian #: [phone]  DOCUMENTATION CODES Per approved criteria  -Underweight    Alger Memos 05/18/2012, 12:31 PM

## 2012-05-18 NOTE — Progress Notes (Signed)
DC instructions given to pt and family at this time re: f/u appts, diet, activity, s/s of problems to report to physician, home meds, and community resources.  Family verbalizes understanding of all instructions.

## 2012-05-18 NOTE — Care Management Note (Signed)
    Page 1 of 1   05/18/2012     2:00:16 PM   CARE MANAGEMENT NOTE 05/18/2012  Patient:  [name], [name]   Account Number:  [id number]  Date Initiated:  05/18/2012  Documentation initiated by:  GRAVES-BIGELOW,Bertha Lokken  Subjective/Objective Assessment:   Pt admitted with seizures. Pt is form home with family and they provide great support. Pt goes to [facility] and sees [facility].     Action/Plan:   CM did make a follow up appointment for coumadin clinic for Friday 05-21-12 at 245 pm. Family is aware. No further needs from CM at this time.   Anticipated DC Date:  05/18/2012   Anticipated DC Plan:  HOME/SELF CARE      DC Planning Services  CM consult      Choice offered to / List presented to:             Status of service:  Completed, signed off Medicare Important Message given?   (If response is "NO", the following Medicare IM given date fields will be blank) Date Medicare IM given:   Date Additional Medicare IM given:    Discharge Disposition:  HOME/SELF CARE  Per UR Regulation:  Reviewed for med. necessity/level of care/duration of stay  If discussed at Long Length of Stay Meetings, dates discussed:    Comments:

## 2012-05-18 NOTE — Progress Notes (Signed)
Subjective: Had no recurrent seizures overnight per nursing staff and patient's family. No new complaints this morning.  Objective: Current vital signs: BP 93/65  Pulse 95  Temp 98.3 F (36.8 C) (Oral)  Resp 18  Ht 5\' 3"  (1.6 m)  Wt 43.545 kg (96 lb)  BMI 17.01 kg/m2  SpO2 97%  Neurologic Exam: Alert and in no acute distress. Appears to be breathing without difficulty compared to late yesterday afternoon. Occasional nonproductive cough. Face is symmetrical in speech is unchanged from baseline. Moves extremities equally and symmetrically.  Lab Results: Blood culture and stool culture results pending. INR today was 2.63.  Medications:  Scheduled:   . amoxicillin-clavulanate  1 tablet Oral Q12H  . carbamazepine  200 mg Oral Q8H  . lacosamide  50 mg Oral TID  . levETIRAcetam  1,500 mg Oral BID  . levothyroxine  50 mcg Oral Q breakfast  . loratadine  10 mg Oral Daily  . phosphorus  500 mg Oral TID AC & HS  . potassium chloride SA  20 mEq Oral TID AC & HS  . potassium chloride  40 mEq Oral BID  . sodium chloride  3 mL Intravenous Q12H  . topiramate  150 mg Oral TID  . warfarin  5 mg Oral q1800  . Warfarin - Pharmacist Dosing Inpatient   Does not apply q1800  . DISCONTD: ampicillin-sulbactam (UNASYN) IV  3 g Intravenous Q6H   WUJ:[id number], acetaminophen, LORazepam, ondansetron (ZOFRAN) IV, ondansetron, sodium chloride  Assessment/Plan: Breakthrough seizures on 05/16/2012 no recurrence of seizure since addition. [name] has resolved. INR is remaining therapeutic. Etiology for patient's fever is most likely aspiration pneumonia, for which he is receiving antibiotic therapy. No changes will be made in his seizure management at this point. We will continue to follow him daily.  C.R. Roseanne Reno, MD [facility] [phone]  05/18/2012  9:59 AM

## 2012-05-18 NOTE — Progress Notes (Signed)
[facility] PROGRESS NOTE  [name] ZOX:[id number] DOB: [birth year] DOA: 05/16/2012   Assessment/Plan: [name] (05/17/2012) -held dose of tegretol, resumed at a lower dose. -appreciate neuro consult.  SEIZURE DISORDER (06/22/2007) -no events since admission appreciate neurology assistance.  DVT of upper extremity (deep vein thrombosis) (09/04/2011) -INR therapeutic.  Aspiration pneumonia (05/17/2012) -05/16/2012 Unasyn IV change to Augmentin on 05/17/2012. -mld temp 05/17/2012 -respiration <18. Improved from yesterday. -sating 99% on RA.  Diarrhea (05/17/2012) -c.dif negative, now improving.  Hypokalemia: -replete check a mag. level, b-met in am    Code Status: full Family Communication: mother Disposition Plan: once fevers improved     LOS: 2 days   Procedures:  none  Antibiotics:  unasyn  augmentin   Subjective: Mother notice respiration here faster than normal  Objective: Filed Vitals:   05/17/12 1621 05/17/12 1908 05/17/12 2000 05/18/12 0500  BP:   94/66 93/65  Pulse:   101 95  Temp: 99.6 F (37.6 C) 98.5 F (36.9 C) 99 F (37.2 C) 98.3 F (36.8 C)  TempSrc: Oral Oral  Oral  Resp:   16 18  Height:      Weight:      SpO2:   97% 97%    Intake/Output Summary (Last 24 hours) at 05/18/12 0738 Last data filed at 05/18/12 0500  Gross per 24 hour  Intake 2348.5 ml  Output   1053 ml  Net 1295.5 ml   Weight change:   Exam:  General: Alert, in no acute distress.  HEENT: No bruits, no goiter.  Heart: Regular rate and rhythm, without murmurs, rubs, gallops.  Lungs: Good air movement, crackles on the right Abdomen: Soft, nontender, nondistended, positive bowel sounds.    Data Reviewed: Basic Metabolic Panel:  Lab 05/18/12 8119 05/17/12 1311 05/17/12 0535 05/16/12 2216  NA 139 -- 136 139  K 3.6 -- 3.1* --  CL 109 -- 106 109  CO2 18* -- 18* --  GLUCOSE 109* -- 98 119*  BUN 5* -- 5* 3*  CREATININE 0.58 -- 0.55 0.80    CALCIUM 8.8 -- 8.3* --  MG -- 1.9 -- --  PHOS -- -- -- --   Liver Function Tests:  Lab 05/17/12 0535  AST 13  ALT 12  ALKPHOS 71  BILITOT 0.3  PROT 6.5  ALBUMIN 2.5*   No results found for this basename: LIPASE:5,AMYLASE:5 in the last 168 hours No results found for this basename: AMMONIA:5 in the last 168 hours CBC:  Lab 05/17/12 0535 05/16/12 2216 05/16/12 2144  WBC 6.5 -- 8.2  NEUTROABS 4.3 -- 5.4  HGB 9.6* 12.6* 10.6*  HCT 28.4* 37.0* 30.9*  MCV 83.3 -- 82.6  PLT 195 -- 228   Cardiac Enzymes: No results found for this basename: CKTOTAL:5,CKMB:5,CKMBINDEX:5,TROPONINI:5 in the last 168 hours BNP: No components found with this basename: POCBNP:5 CBG: No results found for this basename: GLUCAP:5 in the last 168 hours  Recent Results (from the past 240 hour(s))  CLOSTRIDIUM DIFFICILE BY PCR     Status: Normal   Collection Time   05/17/12 11:30 AM      Component Value Range Status Comment   C difficile by pcr NEGATIVE  NEGATIVE Final      Studies: Dg Chest Portable 1 View  05/16/2012  *RADIOLOGY REPORT*  Clinical Data: Cough.  Seizures.  PORTABLE CHEST - 1 VIEW  Comparison: Plain film of the chest 04/10/2012.  Findings: [name] is in place with the tip projecting over the lower superior vena cava.  Neural stimulator device on the left is again seen.  The patient is rotated on the study.  There is some hazy airspace opacity in the right lung base.  Left lung is clear. No pneumothorax.  Heart size normal.  IMPRESSION: Hazy right basilar airspace disease could be due to atelectasis, aspiration pneumonia.   Original Report Authenticated By: Bernadene Bell. D'ALESSIO, M.D.     Scheduled Meds:    . amoxicillin-clavulanate  1 tablet Oral Q12H  . carbamazepine  200 mg Oral Q8H  . lacosamide  50 mg Oral TID  . levETIRAcetam  1,500 mg Oral BID  . levothyroxine  50 mcg Oral Q breakfast  . loratadine  10 mg Oral Daily  . phosphorus  500 mg Oral TID AC & HS  . potassium  chloride SA  20 mEq Oral TID AC & HS  . potassium chloride  40 mEq Oral BID  . sodium chloride  3 mL Intravenous Q12H  . topiramate  150 mg Oral TID  . warfarin  5 mg Oral q1800  . Warfarin - Pharmacist Dosing Inpatient   Does not apply q1800  . DISCONTD: ampicillin-sulbactam (UNASYN) IV  3 g Intravenous Q6H   Continuous Infusions:    . 0.45 % NaCl with KCl 20 mEq / L 20 mL/hr at 05/17/12 1442    Lambert Keto, MD  [facility] Pager [phone]  If 7PM-7AM, please contact night-coverage [email or web address] Password [facility] 05/18/2012, 7:38 AM

## 2012-05-21 ENCOUNTER — Ambulatory Visit (INDEPENDENT_AMBULATORY_CARE_PROVIDER_SITE_OTHER): Payer: Medicaid Other

## 2012-05-21 DIAGNOSIS — I82629 Acute embolism and thrombosis of deep veins of unspecified upper extremity: Secondary | ICD-10-CM

## 2012-05-21 DIAGNOSIS — Z7901 Long term (current) use of anticoagulants: Secondary | ICD-10-CM

## 2012-05-21 LAB — POCT INR: INR: 4

## 2012-05-22 LAB — STOOL CULTURE

## 2012-05-23 LAB — CULTURE, BLOOD (ROUTINE X 2): Culture: NO GROWTH

## 2012-05-27 ENCOUNTER — Ambulatory Visit (INDEPENDENT_AMBULATORY_CARE_PROVIDER_SITE_OTHER): Payer: Medicaid Other | Admitting: *Deleted

## 2012-05-27 DIAGNOSIS — Z7901 Long term (current) use of anticoagulants: Secondary | ICD-10-CM

## 2012-05-27 DIAGNOSIS — I82629 Acute embolism and thrombosis of deep veins of unspecified upper extremity: Secondary | ICD-10-CM

## 2012-05-27 LAB — POCT INR: INR: 2.3

## 2012-06-02 ENCOUNTER — Encounter (HOSPITAL_COMMUNITY): Payer: Self-pay | Admitting: *Deleted

## 2012-06-02 ENCOUNTER — Emergency Department (HOSPITAL_COMMUNITY)
Admission: EM | Admit: 2012-06-02 | Discharge: 2012-06-02 | Disposition: A | Discharge status: Home / Self Care | Payer: Medicaid Other | Attending: Emergency Medicine | Admitting: Emergency Medicine

## 2012-06-02 DIAGNOSIS — Z7901 Long term (current) use of anticoagulants: Secondary | ICD-10-CM | POA: Insufficient documentation

## 2012-06-02 DIAGNOSIS — Z86718 Personal history of other venous thrombosis and embolism: Secondary | ICD-10-CM | POA: Insufficient documentation

## 2012-06-02 DIAGNOSIS — F70 Mild intellectual disabilities: Secondary | ICD-10-CM | POA: Insufficient documentation

## 2012-06-02 DIAGNOSIS — G4730 Sleep apnea, unspecified: Secondary | ICD-10-CM | POA: Insufficient documentation

## 2012-06-02 DIAGNOSIS — G40909 Epilepsy, unspecified, not intractable, without status epilepticus: Secondary | ICD-10-CM | POA: Insufficient documentation

## 2012-06-02 DIAGNOSIS — I059 Rheumatic mitral valve disease, unspecified: Secondary | ICD-10-CM | POA: Insufficient documentation

## 2012-06-02 DIAGNOSIS — E785 Hyperlipidemia, unspecified: Secondary | ICD-10-CM | POA: Insufficient documentation

## 2012-06-02 DIAGNOSIS — E039 Hypothyroidism, unspecified: Secondary | ICD-10-CM | POA: Insufficient documentation

## 2012-06-02 DIAGNOSIS — Z79899 Other long term (current) drug therapy: Secondary | ICD-10-CM | POA: Insufficient documentation

## 2012-06-02 DIAGNOSIS — R040 Epistaxis: Secondary | ICD-10-CM | POA: Insufficient documentation

## 2012-06-02 HISTORY — DX: Acute embolism and thrombosis of unspecified vein: I82.90

## 2012-06-02 LAB — PROTIME-INR: Prothrombin Time: 28.3 seconds — ABNORMAL HIGH (ref 11.6–15.2)

## 2012-06-02 LAB — POCT I-STAT, CHEM 8
Calcium, Ion: 1.27 mmol/L — ABNORMAL HIGH (ref 1.12–1.23)
Glucose, Bld: 109 mg/dL — ABNORMAL HIGH (ref 70–99)
HCT: 35 % — ABNORMAL LOW (ref 39.0–52.0)
Hemoglobin: 11.9 g/dL — ABNORMAL LOW (ref 13.0–17.0)
TCO2: 20 mmol/L (ref 0–100)

## 2012-06-02 NOTE — ED Provider Notes (Signed)
History    This chart was scribed for Flint Melter, MD, MD by Smitty Pluck. The patient was seen in room [facility] and the patient's care was started at 12:58PM.   CSN: [id number]  Arrival date & time 06/02/12  1125      No chief complaint on file.   (Consider location/radiation/quality/duration/timing/severity/associated sxs/prior treatment) The history is provided by the patient. No language interpreter was used.   [name] is a 26 y.o. male who presents to the Emergency Department complaining of intermittent right nare nosebleed. Pt takes coumadin due to blood clots in left chest that was from old portacath.   Mom reports bleed stopped for a little while but lasted for 1 hour. Pt has tried to apply pressure to stop bleed but nose continued to bleed. Denies any other pain currently.   Past Medical History  Diagnosis Date  . ALLERGIC RHINITIS 12/26/2009  . MITRAL VALVE PROLAPSE 06/22/2007  . SEIZURE DISORDER 06/22/2007  . Unspecified hypothyroidism 06/22/2007  . Growth disorder   . Mental retardation, mild (I.Q. 50-70)   . Brown's syndrome   . Heart murmur     moderate MR  . Sleep apnea     cpap , last sleep study 10/01/11  . Hypokalemia   . Atrial fibrillation     During port removal and attempted placement in July 2013, transientt  . Hyperlipidemia     No therapy  . Blood clot in vein     Past Surgical History  Procedure Date  . Implantation vagal nerve stimulator     Left  . Portacath placement     and removal 04/08/12  . Eye surgery     X2 for Brown Syndrome    Family History  Problem Relation Age of Onset  . Breast cancer Mother   . Asthma Father   . Diabetes Father   . Coronary artery disease Maternal Grandfather   . Coronary artery disease Paternal Grandfather     History  Substance Use Topics  . Smoking status: Never Smoker   . Smokeless tobacco: Never Used  . Alcohol Use: No      Review of Systems  Constitutional: Negative for fever and  chills.  HENT: Positive for nosebleeds.   Respiratory: Negative for shortness of breath.   Gastrointestinal: Negative for nausea and vomiting.  Neurological: Negative for weakness.  All other systems reviewed and are negative.    Allergies  Divalproex sodium; Phenytoin; and Vancomycin  Home Medications   Current Outpatient Rx  Name Route Sig Dispense Refill  . CARBAMAZEPINE ER 100 MG PO TB12 Oral Take 250 mg by mouth 3 (three) times daily.    Marland Kitchen CETIRIZINE HCL 10 MG PO TABS Oral Take 10 mg by mouth every morning.    Marland Kitchen DIAZEPAM 2 MG PO TABS Oral Take 2 mg by mouth as needed. Take as needed when having sensation seizure.     Marland Kitchen DIAZEPAM 10 MG/2ML IM DEVI Intramuscular Inject 10 mg into the muscle daily as needed. For seizures.    Marland Kitchen LACOSAMIDE 10 MG/ML PO SOLN Oral Take 5 mLs by mouth 3 (three) times daily. 7am 130p and 9pm    . LEVETIRACETAM 500 MG PO TABS Oral Take 1,500 mg by mouth 2 (two) times daily.    Marland Kitchen LEVOTHYROXINE SODIUM 50 MCG PO TABS Oral Take 50 mcg by mouth every morning.     Marland Kitchen LORAZEPAM 2 MG/ML PO CONC Oral Take 2 mg by mouth every 8 (eight) hours as  needed. It would be given by IV    . ADULT MULTIVITAMIN W/MINERALS CH Oral Take 1 tablet by mouth daily.    Marland Kitchen PHENOBARBITAL SODIUM 60 MG/ML IJ SOLN Intravenous Inject 65 mg into the vein daily as needed. For seizures    . POTASSIUM CHLORIDE CRYS ER 20 MEQ PO TBCR Oral Take 20 mEq by mouth 4 (four) times daily.    Marland Kitchen POTASSIUM PHOSPHATE MONOBASIC 500 MG PO TABS Oral Take 500 mg by mouth 4 (four) times daily. Take at 7a, 130p, 5p, and 9p    . TOPIRAMATE 100 MG PO TABS Oral Take 150 mg by mouth 3 (three) times daily. Take at 7am, 1:30pm, and 9pm.    . WARFARIN SODIUM 5 MG PO TABS Oral Take 2.5-5 mg by mouth daily at 6 PM. Take 2.5 mg on tuesdays and saturdays. Take 5 mg on the rest of the week.      BP 92/70  Pulse 90  Temp 98.2 F (36.8 C) (Axillary)  Resp 18  SpO2 100%  Physical Exam  Nursing note and vitals  reviewed. Constitutional: He is oriented to person, place, and time. He appears well-developed and well-nourished. No distress.  HENT:  Head: Normocephalic and atraumatic.       Clear nasal discharge bilaterally No active bleeding  No hematoma   Eyes: EOM are normal.  Neck: Neck supple. No tracheal deviation present.  Cardiovascular: Normal rate.   Pulmonary/Chest: Effort normal. No respiratory distress.  Musculoskeletal: Normal range of motion.  Neurological: He is alert and oriented to person, place, and time.  Skin: Skin is warm and dry.  Psychiatric: He has a normal mood and affect. His behavior is normal.    ED Course  EPISTAXIS MANAGEMENT Date/Time: 06/02/2012 1:15 PM Performed by: Effie Shy, Amariana Mirando L Authorized by: Mancel Bale L Consent: Verbal consent obtained. Risks and benefits: risks, benefits and alternatives were discussed Consent given by: guardian and parent Patient understanding: patient states understanding of the procedure being performed Patient consent: the patient's understanding of the procedure matches consent given Procedure consent: procedure consent matches procedure scheduled Relevant documents: relevant documents present and verified Test results: test results available and properly labeled Site marked: the operative site was not marked Imaging studies: imaging studies not available Required items: required blood products, implants, devices, and special equipment available Patient identity confirmed: arm band, provided demographic data and hospital-assigned identification number Time out: Immediately prior to procedure a "time out" was called to verify the correct patient, procedure, equipment, support staff and site/side marked as required. Patient sedated: no Treatment site: right anterior Repair method: nasal balloon Post-procedure assessment: bleeding stopped Treatment complexity: simple Patient tolerance: Patient tolerated the procedure well  with no immediate complications.   (including critical care time) DIAGNOSTIC STUDIES: Oxygen Saturation is 100% on room air, normal by my interpretation.    COORDINATION OF CARE: 1:03 PM Discussed pt ED treatment with pt     Labs Reviewed  PROTIME-INR - Abnormal; Notable for the following:    Prothrombin Time 28.3 (*)     INR 2.60 (*)     All other components within normal limits  POCT I-STAT, CHEM 8 - Abnormal; Notable for the following:    Glucose, Bld 109 (*)     Calcium, Ion 1.27 (*)     Hemoglobin 11.9 (*)     HCT 35.0 (*)     All other components within normal limits   No results found.   1. Epistaxis  MDM  Nose bleeding, with anticoagulation. Doubt hemodynamic instability, or expected vascular collapse.      I personally performed the services described in this documentation, which was scribed in my presence. The recorded information has been reviewed and considered.    Plan: Home Medications- usual; Home Treatments- rest; Recommended follow up- PCP as scheduled at [facility] today, and removal Rapid Rhinp in 5-7 days      Flint Melter, MD 06/02/12 2017

## 2012-06-02 NOTE — ED Notes (Signed)
Pt has blood clots somewhere in his veins in his left chest that was from an old portacath.  PT is on coumadin and has been having issues with nose bleeding today and has been hard to stop.  Now blood is trickling out of nose

## 2012-06-10 ENCOUNTER — Ambulatory Visit (INDEPENDENT_AMBULATORY_CARE_PROVIDER_SITE_OTHER): Payer: Medicaid Other | Admitting: *Deleted

## 2012-06-10 DIAGNOSIS — I82629 Acute embolism and thrombosis of deep veins of unspecified upper extremity: Secondary | ICD-10-CM

## 2012-06-10 DIAGNOSIS — Z7901 Long term (current) use of anticoagulants: Secondary | ICD-10-CM

## 2012-06-18 ENCOUNTER — Encounter: Payer: Self-pay | Admitting: Cardiology

## 2012-06-18 ENCOUNTER — Ambulatory Visit (INDEPENDENT_AMBULATORY_CARE_PROVIDER_SITE_OTHER): Payer: Medicaid Other

## 2012-06-18 DIAGNOSIS — Z7901 Long term (current) use of anticoagulants: Secondary | ICD-10-CM

## 2012-06-18 DIAGNOSIS — I82629 Acute embolism and thrombosis of deep veins of unspecified upper extremity: Secondary | ICD-10-CM

## 2012-06-18 NOTE — Progress Notes (Signed)
Patient ID: [name], male   DOB: 03-26-1986, 26 y.o.   MRN: [id number] Patient is a 26 year old male who is on Coumadin for an apparent upper extremity DVT by his sister's report. Patient presented today to Coumadin clinic for INR check. In the lobby he was noted to have a grand mal seizure. His sister who cares for him [name]) was with him. He is followed by Dr. Sharene Skeans routinely for this issue. He has approximately one seizure per week by her report. His symptoms are not new. Following his seizure he would not allow vitals check but stated he was back to normal. He did not suffer trauma during the event. No evidence of head injury and no complaints following event. The patient will followup with neurology for these seizures and we'll continue his present medications. Olga Millers

## 2012-07-02 ENCOUNTER — Ambulatory Visit (INDEPENDENT_AMBULATORY_CARE_PROVIDER_SITE_OTHER): Payer: Medicaid Other

## 2012-07-02 DIAGNOSIS — Z7901 Long term (current) use of anticoagulants: Secondary | ICD-10-CM

## 2012-07-02 DIAGNOSIS — I82629 Acute embolism and thrombosis of deep veins of unspecified upper extremity: Secondary | ICD-10-CM

## 2012-07-12 ENCOUNTER — Ambulatory Visit (INDEPENDENT_AMBULATORY_CARE_PROVIDER_SITE_OTHER): Payer: Medicaid Other

## 2012-07-12 DIAGNOSIS — Z7901 Long term (current) use of anticoagulants: Secondary | ICD-10-CM

## 2012-07-12 DIAGNOSIS — I82629 Acute embolism and thrombosis of deep veins of unspecified upper extremity: Secondary | ICD-10-CM

## 2012-07-12 LAB — POCT INR: INR: 2.1

## 2012-07-17 ENCOUNTER — Observation Stay (HOSPITAL_COMMUNITY)
Admission: EM | Admit: 2012-07-17 | Discharge: 2012-07-18 | Disposition: A | Discharge status: Home / Self Care | Payer: Medicaid Other | Attending: Neurology | Admitting: Neurology

## 2012-07-17 ENCOUNTER — Encounter (HOSPITAL_COMMUNITY): Payer: Self-pay | Admitting: *Deleted

## 2012-07-17 DIAGNOSIS — E039 Hypothyroidism, unspecified: Secondary | ICD-10-CM | POA: Insufficient documentation

## 2012-07-17 DIAGNOSIS — G4730 Sleep apnea, unspecified: Secondary | ICD-10-CM | POA: Insufficient documentation

## 2012-07-17 DIAGNOSIS — G40909 Epilepsy, unspecified, not intractable, without status epilepticus: Principal | ICD-10-CM

## 2012-07-17 DIAGNOSIS — Z86718 Personal history of other venous thrombosis and embolism: Secondary | ICD-10-CM | POA: Insufficient documentation

## 2012-07-17 DIAGNOSIS — F70 Mild intellectual disabilities: Secondary | ICD-10-CM | POA: Insufficient documentation

## 2012-07-17 DIAGNOSIS — E785 Hyperlipidemia, unspecified: Secondary | ICD-10-CM | POA: Insufficient documentation

## 2012-07-17 DIAGNOSIS — Z7901 Long term (current) use of anticoagulants: Secondary | ICD-10-CM | POA: Insufficient documentation

## 2012-07-17 DIAGNOSIS — I059 Rheumatic mitral valve disease, unspecified: Secondary | ICD-10-CM | POA: Insufficient documentation

## 2012-07-17 DIAGNOSIS — Z79899 Other long term (current) drug therapy: Secondary | ICD-10-CM | POA: Insufficient documentation

## 2012-07-17 LAB — CBC WITH DIFFERENTIAL/PLATELET
HCT: 37.6 % — ABNORMAL LOW (ref 39.0–52.0)
Hemoglobin: 12.7 g/dL — ABNORMAL LOW (ref 13.0–17.0)
Lymphocytes Relative: 55 % — ABNORMAL HIGH (ref 12–46)
MCHC: 33.8 g/dL (ref 30.0–36.0)
Monocytes Absolute: 0.3 10*3/uL (ref 0.1–1.0)
Monocytes Relative: 6 % (ref 3–12)
Neutro Abs: 1.7 10*3/uL (ref 1.7–7.7)
WBC: 4.7 10*3/uL (ref 4.0–10.5)

## 2012-07-17 LAB — COMPREHENSIVE METABOLIC PANEL
ALT: 20 U/L (ref 0–53)
AST: 30 U/L (ref 0–37)
CO2: 18 mEq/L — ABNORMAL LOW (ref 19–32)
Calcium: 8.6 mg/dL (ref 8.4–10.5)
Chloride: 107 mEq/L (ref 96–112)
GFR calc non Af Amer: >90 mL/min (ref >90)
Potassium: 3.9 mEq/L (ref 3.5–5.1)
Sodium: 137 mEq/L (ref 135–145)

## 2012-07-17 LAB — CARBAMAZEPINE LEVEL, TOTAL: Carbamazepine Lvl: 10.8 ug/mL (ref 4.0–12.0)

## 2012-07-17 MED ORDER — GUAIFENESIN-DM 100-10 MG/5ML PO SYRP: 10.0000 mL | ORAL_SOLUTION | Freq: Three times a day (TID) | ORAL | Status: DC | PRN

## 2012-07-17 MED ORDER — POTASSIUM CHLORIDE CRYS ER 20 MEQ PO TBCR
20.0000 meq | EXTENDED_RELEASE_TABLET | Freq: Four times a day (QID) | ORAL | Status: DC
  Administered 2012-07-17 – 2012-07-18 (×3): 20 meq via ORAL
  Filled 2012-07-17 (×4): qty 1

## 2012-07-17 MED ORDER — DIAZEPAM 2 MG PO TABS: 2.0000 mg | ORAL_TABLET | ORAL | Status: DC | PRN

## 2012-07-17 MED ORDER — CARBAMAZEPINE ER 100 MG PO TB12
250.0000 mg | ORAL_TABLET | Freq: Three times a day (TID) | ORAL | Status: DC
  Administered 2012-07-17: 300 mg via ORAL
  Administered 2012-07-18: 100 mg via ORAL
  Administered 2012-07-18: 300 mg via ORAL
  Filled 2012-07-17 (×3): qty 3

## 2012-07-17 MED ORDER — PHENOBARBITAL SODIUM 60 MG/ML IJ SOLN: 65.0000 mg | Freq: Every day | INTRAMUSCULAR | Status: DC | PRN

## 2012-07-17 MED ORDER — LEVOTHYROXINE SODIUM 50 MCG PO TABS
50.0000 ug | ORAL_TABLET | Freq: Every morning | ORAL | Status: DC
  Administered 2012-07-18: 50 ug via ORAL
  Filled 2012-07-17: qty 1

## 2012-07-17 MED ORDER — LEVETIRACETAM 750 MG PO TABS
1500.0000 mg | ORAL_TABLET | Freq: Two times a day (BID) | ORAL | Status: DC
  Administered 2012-07-17 – 2012-07-18 (×2): 1500 mg via ORAL
  Filled 2012-07-17 (×2): qty 2

## 2012-07-17 MED ORDER — TOPIRAMATE 25 MG PO TABS
150.0000 mg | ORAL_TABLET | Freq: Three times a day (TID) | ORAL | Status: DC
  Administered 2012-07-17 – 2012-07-18 (×3): 150 mg via ORAL
  Filled 2012-07-17 (×3): qty 2

## 2012-07-17 MED ORDER — ADULT MULTIVITAMIN W/MINERALS CH
1.0000 | ORAL_TABLET | Freq: Every day | ORAL | Status: DC
  Administered 2012-07-18: 1 via ORAL
  Filled 2012-07-17: qty 1

## 2012-07-17 MED ORDER — LORAZEPAM 2 MG/ML PO CONC: 2.0000 mg | Freq: Three times a day (TID) | ORAL | Status: DC | PRN

## 2012-07-17 MED ORDER — POTASSIUM PHOSPHATE MONOBASIC 500 MG PO TABS
500.0000 mg | ORAL_TABLET | Freq: Four times a day (QID) | ORAL | Status: DC
  Administered 2012-07-17 – 2012-07-18 (×3): 500 mg via ORAL
  Filled 2012-07-17 (×4): qty 1

## 2012-07-17 MED ORDER — SODIUM CHLORIDE 0.9 % IV SOLN
INTRAVENOUS | Status: DC
  Administered 2012-07-17: 20:00:00 via INTRAVENOUS

## 2012-07-17 MED ORDER — DIAZEPAM 10 MG/2ML IM DEVI: 10.0000 mg | Freq: Every day | INTRAMUSCULAR | Status: DC | PRN

## 2012-07-17 MED ORDER — LACOSAMIDE 50 MG PO TABS
50.0000 mg | ORAL_TABLET | Freq: Two times a day (BID) | ORAL | Status: DC
  Administered 2012-07-17 – 2012-07-18 (×2): 50 mg via ORAL
  Filled 2012-07-17 (×2): qty 1

## 2012-07-17 MED ORDER — WARFARIN SODIUM 2.5 MG PO TABS: 2.5000 mg | ORAL_TABLET | ORAL | Status: DC

## 2012-07-17 MED ORDER — LORATADINE 10 MG PO TABS
10.0000 mg | ORAL_TABLET | Freq: Every day | ORAL | Status: DC
Start: 2012-07-18 — End: 2012-07-18
  Administered 2012-07-18: 10 mg via ORAL
  Filled 2012-07-17: qty 1

## 2012-07-17 NOTE — ED Notes (Signed)
Pt's family asking about pt's night time meds.  EDP [place] approved pt taking night time meds.  Family to give meds from home to pt.  Pt taking Tegretol 100mg , Vimpat 100mg , Keppra 1500mg , Potassium 20 mEq, K-Phos 500mg , Topamax 150mg .

## 2012-07-17 NOTE — ED Notes (Signed)
Patient with reported hx of seizures for 3 days.  Patient has been receiving ativan and phenobarbital via port. Patient reported to have multiple petit mal seizures today since 1800.  12 seizures noted since 1830.  Patient is seen by Dr Sharene Skeans.  Dr Sharene Skeans to be called to assist with patient care.  Patient is currently on seizure meds

## 2012-07-17 NOTE — H&P (Signed)
[name] is an 26 y.o. male.    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  Mother notes pt administered human growth hormone (HGH) at younger age, but stopped around 26 y/o due to increasing seizure frequency.  Despite this, he has continued to have episodic seizures over the years (5 types), very refractory to multiple past anticonvulsants.  More recently, has evolved into at least 2 seizure types:  GTC seizures (2-3x/wk...last yesterday), and "sensory seizures (3-4/day)" (Lt > Rt transient parasthesias.  She notes starting at [employer] he had about "30" sesnsory seizures, which abruptly stopped around 8P.  She notes that Dr Sharene Skeans wanted him brought to the ER if he experienced more seizures.   Mother denies any recent fevers, or any other know precipitating factor for seizures.  Past Medical History  Diagnosis Date  . ALLERGIC RHINITIS 12/26/2009  . MITRAL VALVE PROLAPSE 06/22/2007  . SEIZURE DISORDER 06/22/2007  . Unspecified hypothyroidism 06/22/2007  . Growth disorder   . Mental retardation, mild (I.Q. 50-70)   . Brown's syndrome   . Heart murmur     moderate MR  . Sleep apnea     cpap , last sleep study 10/01/11  . Hypokalemia   . Atrial fibrillation     During port removal and attempted placement in July 2013, transientt  . Hyperlipidemia     No therapy  . Blood clot in vein     Past Surgical History  Procedure Date  . Implantation vagal nerve stimulator     Left  . Portacath placement     and removal 04/08/12  . Eye surgery     X2 for Brown Syndrome    Family History  Problem Relation Age of Onset  . Breast cancer Mother   . Asthma Father   . Diabetes Father   . Coronary artery disease Maternal Grandfather   . Coronary artery disease Paternal Grandfather    Social History:  reports that he has  never smoked. He has never used smokeless tobacco. He reports that he does not drink alcohol or use illicit drugs.  Current facility-administered medications:0.9 %  sodium chloride infusion, , Intravenous, Continuous, Toy Baker, MD, Last Rate: 125 mL/hr at 07/17/12 1936 Current outpatient prescriptions:carbamazepine (TEGRETOL XR) 100 MG 12 hr tablet, Take 250 mg by mouth 3 (three) times daily. Patient is using 200 mg @ 8:00, 200 mg @1 :30, 250 mg @ 9:30., Disp: , Rfl: ;  cetirizine (ZYRTEC) 10 MG tablet, Take 10 mg by mouth every morning., Disp: , Rfl: ;  guaiFENesin-dextromethorphan (ROBITUSSIN DM) 100-10 MG/5ML syrup, Take 10 mLs by mouth 3 (three) times daily as needed. For cough., Disp: , Rfl:  lacosamide (VIMPAT) 50 MG TABS, Take 50 mg by mouth 2 (two) times daily. Patient uses 50 mg in the morning and 100 mg in the evening at 9:30., Disp: , Rfl: ;  levETIRAcetam (KEPPRA) 500 MG tablet, Take 1,500 mg by mouth 2 (two) times daily., Disp: , Rfl: ;  levothyroxine (SYNTHROID, LEVOTHROID) 50 MCG tablet, Take 50 mcg by mouth every morning. , Disp: , Rfl:  LORazepam (ATIVAN) 2 MG/ML concentrated solution, Take 2 mg by mouth every 8 (eight) hours as needed. It would be given by IV, Disp: , Rfl: ;  Multiple Vitamin (MULTIVITAMIN WITH MINERALS) TABS, Take 1 tablet by mouth daily., Disp: , Rfl: ;  potassium chloride SA (K-DUR,KLOR-CON) 20 MEQ tablet, Take 20 mEq by mouth 4 (four) times daily., Disp: , Rfl:  potassium phosphate, monobasic, (K-PHOS) 500 MG tablet, Take 500 mg by mouth 4 (four) times daily. Take at 7a, 130p, 5p, and 9p, Disp: , Rfl: ;  topiramate (TOPAMAX) 100 MG tablet, Take 150 mg by mouth 3 (three) times daily. Take at 7am, 1:30pm, and 9pm., Disp: , Rfl: ;  warfarin (COUMADIN) 5 MG tablet, Take 2.5-5 mg by mouth daily at 6 PM. Take 2.5 mg on tuesdays and saturdays. Take 5 mg on the rest of the week., Disp: , Rfl:  diazepam (VALIUM) 2 MG tablet, Take 2 mg by mouth as needed. Take as needed when  having sensation seizure. , Disp: , Rfl: ;  Diazepam 10 MG/2ML DEVI, Inject 10 mg into the muscle daily as needed. For seizures., Disp: , Rfl: ;  phenobarbital (LUMINAL) 60 MG/ML injection, Inject 65 mg into the vein daily as needed. For seizures, Disp: , Rfl:    Allergies:  Allergies  Allergen Reactions  . Divalproex Sodium Other (See Comments)    Nausea, vomiting, liver and kidney dysfunction  . Phenytoin Rash    Hypersensitivity  reaction  . Vancomycin Rash    Redman's Syndrome   Review of Systems - Neurological ROS: positive for - numbness/tingling and seizures   (Not in a hospital admission)  Results for orders placed during the hospital encounter of 07/17/12 (from the past 48 hour(s))  PROTIME-INR     Status: Abnormal   Collection Time   07/17/12  7:37 PM      Component Value Range Comment   Prothrombin Time 21.8 (*) 11.6 - 15.2 seconds    INR 1.99 (*) 0.00 - 1.49   CARBAMAZEPINE LEVEL, TOTAL     Status: Normal   Collection Time   07/17/12  7:37 PM      Component Value Range Comment   Carbamazepine Lvl 10.8  4.0 - 12.0 ug/mL   COMPREHENSIVE METABOLIC PANEL     Status: Abnormal   Collection Time   07/17/12  7:37 PM      Component Value Range Comment   Sodium 137  135 - 145 mEq/L    Potassium 3.9  3.5 - 5.1 mEq/L    Chloride 107  96 - 112 mEq/L    CO2 18 (*) 19 - 32 mEq/L    Glucose, Bld 107 (*) 70 - 99 mg/dL    BUN 11  6 - 23 mg/dL    Creatinine, Ser 6.04  0.50 - 1.35 mg/dL    Calcium 8.6  8.4 - 54.0 mg/dL    Total Protein 7.3  6.0 - 8.3 g/dL    Albumin 3.3 (*) 3.5 - 5.2 g/dL    AST 30  0 - 37 U/L    ALT 20  0 - 53 U/L    Alkaline Phosphatase 96  39 - 117 U/L    Total Bilirubin 0.2 (*) 0.3 - 1.2 mg/dL    GFR calc non Af Amer >90  >90 mL/min    GFR calc Af Amer >90  >90 mL/min   PHENOBARBITAL LEVEL     Status: Abnormal   Collection Time   07/17/12  7:37 PM      Component Value Range Comment   Phenobarbital 4.5 (*) 15.0 - 40.0 ug/mL   CBC WITH DIFFERENTIAL      Status: Abnormal   Collection Time   07/17/12  7:37 PM  Component Value Range Comment   WBC 4.7  4.0 - 10.5 K/uL    RBC 4.52  4.22 - 5.81 MIL/uL    Hemoglobin 12.7 (*) 13.0 - 17.0 g/dL    HCT 42.5 (*) 95.6 - 52.0 %    MCV 83.2  78.0 - 100.0 fL    MCH 28.1  26.0 - 34.0 pg    MCHC 33.8  30.0 - 36.0 g/dL    RDW 38.7  56.4 - 33.2 %    Platelets 236  150 - 400 K/uL    Neutrophils Relative 37 (*) 43 - 77 %    Neutro Abs 1.7  1.7 - 7.7 K/uL    Lymphocytes Relative 55 (*) 12 - 46 %    Lymphs Abs 2.6  0.7 - 4.0 K/uL    Monocytes Relative 6  3 - 12 %    Monocytes Absolute 0.3  0.1 - 1.0 K/uL    Eosinophils Relative 2  0 - 5 %    Eosinophils Absolute 0.1  0.0 - 0.7 K/uL    Basophils Relative 0  0 - 1 %    Basophils Absolute 0.0  0.0 - 0.1 K/uL    No results found.  @ROS @  Blood pressure 96/62, temperature 98.3 F (36.8 C), temperature source Oral, resp. rate 22, height 5\' 3"  (1.6 m), weight 43.545 kg (96 lb), SpO2 100.00%.  Physical Exam: General appearance: alert, cooperative, no distress and slowed mentation Resp: clear to auscultation bilaterally Cardio: systolic murmur: early systolic 2/6, crescendo at lower left sternal border GI: soft, non-tender; bowel sounds normal; no masses,  no organomegaly Skin: Skin color, texture, turgor normal. No rashes or lesions Other: Porta cath noted over the left chest  Neurologic Exam: Patient is alert and oriented x 2 (? Year).  No acute distress.  Dysarthric speech.  Able to name, repeat, and follow commands.  EOMI, VFFC, PERRL.  Dysmorphic facial/cranial appearance. Face symmetric with normal sensation.  Tongue protrudes midline, otherwise CN's II-XII intact. Motor: Normal bulk and tone with 5/5 strength throughout.  Sensation:  Normal pinprick, vibration, and proprioception.  Cerebellar:  Normal finger to nose  No pronator drift.  DTR's 2+ throughout with toes downgoing.   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  Has since returned to baseline neurological status.  Plan: 1) discussed case over the phone with Dr Sharene Skeans.  Given the refractory nature of his condition, will admit and begin [name].  Would assume he can be discharged very soon if remains seizure free overnight. 2) continue current doses of Keppra, Vimpat, Tegretrol, and Topamax.  Consider additional IV doses of Vimpat if seizures recur 3) continue Valium, Phenobarb, Ativan PRN breakthrough seizures 4) discussed with mother    Beryle [facility] Neu[facility] Pager# [phone]  07/17/2012, 9:33 PM

## 2012-07-17 NOTE — ED Provider Notes (Signed)
History     CSN: [id number]  Arrival date & time 07/17/12  5621   First MD Initiated Contact with Patient 07/17/12 1921      Chief Complaint  Patient presents with  . Seizures    (Consider location/radiation/quality/duration/timing/severity/associated sxs/prior treatment) Patient is a 26 y.o. male presenting with seizures. The history is provided by the patient.  Seizures    patient here with increasing seizures at home according to him and his mother. Long-standing history of seizure disorder. Takes 4 different antiepileptics. No recent change in his medications. No recent vomiting, fever, diarrhea. No severe headaches. No history of head trauma. Patient does have grand mal seizures as well as sensory seizures. According to the mother, she spoke with the patient's neurologist Dr. Sharene Skeans wanted the patient admitted to start him on a new antiepileptic  Past Medical History  Diagnosis Date  . ALLERGIC RHINITIS 12/26/2009  . MITRAL VALVE PROLAPSE 06/22/2007  . SEIZURE DISORDER 06/22/2007  . Unspecified hypothyroidism 06/22/2007  . Growth disorder   . Mental retardation, mild (I.Q. 50-70)   . Brown's syndrome   . Heart murmur     moderate MR  . Sleep apnea     cpap , last sleep study 10/01/11  . Hypokalemia   . Atrial fibrillation     During port removal and attempted placement in July 2013, transientt  . Hyperlipidemia     No therapy  . Blood clot in vein     Past Surgical History  Procedure Date  . Implantation vagal nerve stimulator     Left  . Portacath placement     and removal 04/08/12  . Eye surgery     X2 for Brown Syndrome    Family History  Problem Relation Age of Onset  . Breast cancer Mother   . Asthma Father   . Diabetes Father   . Coronary artery disease Maternal Grandfather   . Coronary artery disease Paternal Grandfather     History  Substance Use Topics  . Smoking status: Never Smoker   . Smokeless tobacco: Never Used  . Alcohol Use: No       Review of Systems  Neurological: Positive for seizures.  All other systems reviewed and are negative.    Allergies  Divalproex sodium; Phenytoin; and Vancomycin  Home Medications   Current Outpatient Rx  Name Route Sig Dispense Refill  . CARBAMAZEPINE ER 100 MG PO TB12 Oral Take 250 mg by mouth 3 (three) times daily.    Marland Kitchen CETIRIZINE HCL 10 MG PO TABS Oral Take 10 mg by mouth every morning.    Marland Kitchen DIAZEPAM 2 MG PO TABS Oral Take 2 mg by mouth as needed. Take as needed when having sensation seizure.     Marland Kitchen DIAZEPAM 10 MG/2ML IM DEVI Intramuscular Inject 10 mg into the muscle daily as needed. For seizures.    Marland Kitchen LACOSAMIDE 10 MG/ML PO SOLN Oral Take 5 mLs by mouth 3 (three) times daily. 7am 130p and 9pm    . LEVETIRACETAM 500 MG PO TABS Oral Take 1,500 mg by mouth 2 (two) times daily.    Marland Kitchen LEVOTHYROXINE SODIUM 50 MCG PO TABS Oral Take 50 mcg by mouth every morning.     Marland Kitchen LORAZEPAM 2 MG/ML PO CONC Oral Take 2 mg by mouth every 8 (eight) hours as needed. It would be given by IV    . ADULT MULTIVITAMIN W/MINERALS CH Oral Take 1 tablet by mouth daily.    Marland Kitchen PHENOBARBITAL SODIUM 60  MG/ML IJ SOLN Intravenous Inject 65 mg into the vein daily as needed. For seizures    . POTASSIUM CHLORIDE CRYS ER 20 MEQ PO TBCR Oral Take 20 mEq by mouth 4 (four) times daily.    Marland Kitchen POTASSIUM PHOSPHATE MONOBASIC 500 MG PO TABS Oral Take 500 mg by mouth 4 (four) times daily. Take at 7a, 130p, 5p, and 9p    . TOPIRAMATE 100 MG PO TABS Oral Take 150 mg by mouth 3 (three) times daily. Take at 7am, 1:30pm, and 9pm.    . WARFARIN SODIUM 5 MG PO TABS Oral Take 2.5-5 mg by mouth daily at 6 PM. Take 2.5 mg on tuesdays and saturdays. Take 5 mg on the rest of the week.      BP 96/62  Temp 98.3 F (36.8 C) (Oral)  Resp 22  Ht 5\' 3"  (1.6 m)  Wt 96 lb (43.545 kg)  BMI 17.01 kg/m2  SpO2 100%  Physical Exam  Nursing note and vitals reviewed. Constitutional: He is oriented to person, place, and time. He appears  well-developed and well-nourished.  Non-toxic appearance. No distress.  HENT:  Head: Normocephalic and atraumatic.  Eyes: Conjunctivae normal, EOM and lids are normal. Pupils are equal, round, and reactive to light.  Neck: Normal range of motion. Neck supple. No tracheal deviation present. No mass present.  Cardiovascular: Normal rate, regular rhythm and normal heart sounds.  Exam reveals no gallop.   No murmur heard. Pulmonary/Chest: Effort normal and breath sounds normal. No stridor. No respiratory distress. He has no decreased breath sounds. He has no wheezes. He has no rhonchi. He has no rales.  Abdominal: Soft. Normal appearance and bowel sounds are normal. He exhibits no distension. There is no tenderness. There is no rebound and no CVA tenderness.  Musculoskeletal: Normal range of motion. He exhibits no edema and no tenderness.  Neurological: He is alert and oriented to person, place, and time. He has normal strength. No cranial nerve deficit or sensory deficit. GCS eye subscore is 4. GCS verbal subscore is 5. GCS motor subscore is 6.  Skin: Skin is warm and dry. No abrasion and no rash noted.  Psychiatric: He has a normal mood and affect. His speech is normal and behavior is normal.    ED Course  Procedures (including critical care time)   Labs Reviewed  PROTIME-INR  CARBAMAZEPINE LEVEL, TOTAL  COMPREHENSIVE METABOLIC PANEL  PHENOBARBITAL LEVEL  CBC WITH DIFFERENTIAL   No results found.   No diagnosis found.    MDM  Patient has been seen by neurology and will be admitted        Toy Baker, MD 07/17/12 2132

## 2012-07-18 ENCOUNTER — Encounter (HOSPITAL_COMMUNITY): Payer: Self-pay | Admitting: General Practice

## 2012-07-18 LAB — PROTIME-INR: Prothrombin Time: 23.2 seconds — ABNORMAL HIGH (ref 11.6–15.2)

## 2012-07-18 LAB — CBC
Platelets: 211 10*3/uL (ref 150–400)
RBC: 3.89 MIL/uL — ABNORMAL LOW (ref 4.22–5.81)
WBC: 3.9 10*3/uL — ABNORMAL LOW (ref 4.0–10.5)

## 2012-07-18 LAB — COMPREHENSIVE METABOLIC PANEL
BUN: 9 mg/dL (ref 6–23)
CO2: 18 mEq/L — ABNORMAL LOW (ref 19–32)
Chloride: 109 mEq/L (ref 96–112)
Creatinine, Ser: 0.7 mg/dL (ref 0.50–1.35)
GFR calc non Af Amer: >90 mL/min (ref >90)
Total Bilirubin: 0.2 mg/dL — ABNORMAL LOW (ref 0.3–1.2)

## 2012-07-18 MED ORDER — INFLUENZA VIRUS VACC SPLIT PF IM SUSP: 0.5000 mL | INTRAMUSCULAR | Status: DC

## 2012-07-18 MED ORDER — PNEUMOCOCCAL VAC POLYVALENT 25 MCG/0.5ML IJ INJ: 0.5000 mL | INJECTION | INTRAMUSCULAR | Status: DC

## 2012-07-18 MED ORDER — LORAZEPAM 1 MG PO TABS: 2.0000 mg | ORAL_TABLET | Freq: Three times a day (TID) | ORAL | Status: DC | PRN

## 2012-07-18 MED ORDER — HEPARIN SOD (PORK) LOCK FLUSH 100 UNIT/ML IV SOLN: 500.0000 [IU] | INTRAVENOUS | Status: DC | PRN

## 2012-07-18 MED ORDER — PHENOBARBITAL SODIUM 65 MG/ML IJ SOLN: 65.0000 mg | Freq: Every day | INTRAMUSCULAR | Status: DC | PRN

## 2012-07-18 MED ORDER — CLOBAZAM 5 MG PO TABS: 5.0000 mg | ORAL_TABLET | Freq: Every day | ORAL | Status: DC

## 2012-07-18 MED ORDER — WARFARIN - PHYSICIAN DOSING INPATIENT: Freq: Every day | Status: DC

## 2012-07-18 MED ORDER — WARFARIN SODIUM 5 MG PO TABS
5.0000 mg | ORAL_TABLET | ORAL | Status: AC
  Administered 2012-07-18: 5 mg via ORAL
  Filled 2012-07-18: qty 1

## 2012-07-18 MED ORDER — DIAZEPAM 5 MG/ML IJ SOLN: 10.0000 mg | Freq: Every day | INTRAMUSCULAR | Status: DC | PRN

## 2012-07-18 MED ORDER — SODIUM CHLORIDE 0.9 % IJ SOLN
10.0000 mL | INTRAMUSCULAR | Status: DC | PRN
  Administered 2012-07-18 (×2): 10 mL

## 2012-07-18 MED ORDER — LACOSAMIDE 50 MG PO TABS
50.0000 mg | ORAL_TABLET | Freq: Two times a day (BID) | ORAL | Status: DC
  Administered 2012-07-18: 50 mg via ORAL

## 2012-07-18 NOTE — Progress Notes (Signed)
Informational note: ED MD called to inquire if we have [name] (clobazam) 10/26 at 2230, we informed him that we don't and we were not sure when we would get it (our wholesaler does not stock this) and ordering MD has to fill out prior-authorization paperwork supporting it's use. MD said he would discuss this potential delay with Dr.Hickling.  Thanks, Velencia Lenart K. Allena Katz, PharmD Pgr [phone] 07/18/2012 1:12 PM

## 2012-07-18 NOTE — Progress Notes (Addendum)
Discharge instructions reviewed; found corrections to PTA medications ([facility]) and updated the history with pharmacy and called Dr. Thad Ranger.  Reviewed orders on Vimpat and Tegretol XR. Patient ready for discharge home. MAR remains with admission orders as written. Patient will follow the discharge instructions upon release from the hospital.  All Medications reviewed with patient and his caregivers to verify his dosages.

## 2012-07-18 NOTE — Progress Notes (Signed)
Patient has Medicaid for medications. No further CM needs identified. Isidoro Donning RN CCM Case Mgmt phone [phone]

## 2012-07-18 NOTE — Discharge Planning (Signed)
Physician Discharge Summary   Patient ID: [name] [id number] 26 y.o. [birth year]  Admit date: 07/17/2012  Discharge date and time: 07/18/2012, 4:24 PM  Admitting Physician: Beryle Beams, MD   Discharge Physician: Thana Farr, MD  Admission Diagnoses: Seizure disorder [345.90] Epilepsy and recurrent seizures  Discharge Diagnoses: Seizure disorder, Epilepsy and recurrent seizures  Admission Condition: guarded  Discharged Condition: stable  Indication for Admission: Multiple breakthrough seizures in a less ten 24 hor period  Hospital Course: Patient was admitted to 4N.  Did well overnight and had no further seizures.  Pharmacy was unable to obtain [facility] for initiation and it was determined that this would be started on an outpatient basis.  From a seizure standpoint felt to be stable.    Consults: None  Significant Diagnostic Studies: None  Treatments: IV hydration and seizure precautions  Discharge Exam: Neurologic Exam: Patient is alert and oriented x 2 (? Year). No acute distress. Dysarthric speech. Able to name, repeat, and follow commands. EOMI, VFFC, PERRL. Dysmorphic facial/cranial appearance. Face symmetric with normal sensation. Tongue protrudes midline, otherwise CN's II-XII intact.  Motor: Normal bulk and tone with 5/5 strength throughout. Sensation: Normal pinprick, vibration, and proprioception. Cerebellar: Normal finger to nose No pronator drift. DTR's 2+ throughout with toes downgoing   Disposition: 01-Home or Self Care  Patient Instructions:    Medication List     As of 07/18/2012 10:24 AM    TAKE these medications         carbamazepine 100 MG 12 hr tablet   Commonly known as: TEGRETOL XR   Take 250 mg by mouth 3 (three) times daily. Patient is using 200 mg @ 8:00, 200 mg @1 :30, 250 mg @ 9:30.      cetirizine 10 MG tablet   Commonly known as: ZYRTEC   Take 10 mg by mouth every morning.      Clobazam 5 MG Tabs   Take 5 mg by mouth at  bedtime.      diazepam 2 MG tablet   Commonly known as: VALIUM   Take 2 mg by mouth as needed. Take as needed when having sensation seizure.      Diazepam 10 MG/2ML Devi   Inject 10 mg into the muscle daily as needed. For seizures.      guaiFENesin-dextromethorphan 100-10 MG/5ML syrup   Commonly known as: ROBITUSSIN DM   Take 10 mLs by mouth 3 (three) times daily as needed. For cough.      lacosamide 50 MG Tabs   Commonly known as: VIMPAT   Take 50 mg by mouth 2 (two) times daily. Patient uses 50 mg in the morning and 100 mg in the evening at 9:30.      levETIRAcetam 500 MG tablet   Commonly known as: KEPPRA   Take 1,500 mg by mouth 2 (two) times daily.      levothyroxine 50 MCG tablet   Commonly known as: SYNTHROID, LEVOTHROID   Take 50 mcg by mouth every morning.      LORazepam 2 MG/ML concentrated solution   Commonly known as: ATIVAN   Take 2 mg by mouth every 8 (eight) hours as needed. It would be given by IV      multivitamin with minerals Tabs   Take 1 tablet by mouth daily.      phenobarbital 60 MG/ML injection   Commonly known as: LUMINAL   Inject 65 mg into the vein daily as needed. For seizures      potassium chloride  SA 20 MEQ tablet   Commonly known as: K-DUR,KLOR-CON   Take 20 mEq by mouth 4 (four) times daily.      potassium phosphate (monobasic) 500 MG tablet   Commonly known as: K-PHOS ORIGINAL   Take 500 mg by mouth 4 (four) times daily. Take at 7a, 130p, 5p, and 9p      TOPAMAX 100 MG tablet   Generic drug: topiramate   Take 150 mg by mouth 3 (three) times daily. Take at 7am, 1:30pm, and 9pm.      warfarin 5 MG tablet   Commonly known as: COUMADIN   Take 2.5-5 mg by mouth daily at 6 PM. Take 2.5 mg on tuesdays and saturdays. Take 5 mg on the rest of the week.       Activity: as tolerated Diet: as tolerated Wound Care: {none needed Follow-up with Dr. Lovett Sox keep already scheduled appointment  Signed: Thana Farr, MD [facility] [phone] 07/18/2012 10:24 AM

## 2012-07-18 NOTE — Progress Notes (Signed)
Utilization Review Completed.  

## 2012-07-27 ENCOUNTER — Ambulatory Visit (INDEPENDENT_AMBULATORY_CARE_PROVIDER_SITE_OTHER): Payer: Medicaid Other | Admitting: *Deleted

## 2012-07-27 DIAGNOSIS — I82629 Acute embolism and thrombosis of deep veins of unspecified upper extremity: Secondary | ICD-10-CM

## 2012-07-27 DIAGNOSIS — Z7901 Long term (current) use of anticoagulants: Secondary | ICD-10-CM

## 2012-08-05 ENCOUNTER — Ambulatory Visit (INDEPENDENT_AMBULATORY_CARE_PROVIDER_SITE_OTHER): Payer: Medicaid Other | Admitting: *Deleted

## 2012-08-05 DIAGNOSIS — Z7901 Long term (current) use of anticoagulants: Secondary | ICD-10-CM

## 2012-08-05 DIAGNOSIS — I82629 Acute embolism and thrombosis of deep veins of unspecified upper extremity: Secondary | ICD-10-CM

## 2012-08-16 ENCOUNTER — Ambulatory Visit (INDEPENDENT_AMBULATORY_CARE_PROVIDER_SITE_OTHER): Payer: Medicaid Other | Admitting: Pharmacist

## 2012-08-16 DIAGNOSIS — Z7901 Long term (current) use of anticoagulants: Secondary | ICD-10-CM

## 2012-08-16 DIAGNOSIS — I82629 Acute embolism and thrombosis of deep veins of unspecified upper extremity: Secondary | ICD-10-CM

## 2012-09-08 ENCOUNTER — Ambulatory Visit (INDEPENDENT_AMBULATORY_CARE_PROVIDER_SITE_OTHER): Payer: Medicaid Other | Admitting: *Deleted

## 2012-09-08 DIAGNOSIS — I82629 Acute embolism and thrombosis of deep veins of unspecified upper extremity: Secondary | ICD-10-CM

## 2012-09-08 DIAGNOSIS — Z7901 Long term (current) use of anticoagulants: Secondary | ICD-10-CM

## 2012-09-08 LAB — POCT INR: INR: 1.9

## 2012-10-07 ENCOUNTER — Ambulatory Visit (INDEPENDENT_AMBULATORY_CARE_PROVIDER_SITE_OTHER): Payer: Medicaid Other | Admitting: *Deleted

## 2012-10-07 DIAGNOSIS — Z7901 Long term (current) use of anticoagulants: Secondary | ICD-10-CM

## 2012-10-07 DIAGNOSIS — I82629 Acute embolism and thrombosis of deep veins of unspecified upper extremity: Secondary | ICD-10-CM

## 2012-10-07 LAB — POCT INR: INR: 1.9

## 2012-11-01 DIAGNOSIS — M79609 Pain in unspecified limb: Secondary | ICD-10-CM | POA: Insufficient documentation

## 2012-11-11 ENCOUNTER — Ambulatory Visit (INDEPENDENT_AMBULATORY_CARE_PROVIDER_SITE_OTHER): Payer: Medicaid Other | Admitting: *Deleted

## 2012-11-11 DIAGNOSIS — I82629 Acute embolism and thrombosis of deep veins of unspecified upper extremity: Secondary | ICD-10-CM

## 2012-11-11 DIAGNOSIS — Z7901 Long term (current) use of anticoagulants: Secondary | ICD-10-CM

## 2012-11-23 DIAGNOSIS — Q667 Congenital pes cavus, unspecified foot: Secondary | ICD-10-CM | POA: Insufficient documentation

## 2012-11-23 DIAGNOSIS — M216X9 Other acquired deformities of unspecified foot: Secondary | ICD-10-CM | POA: Insufficient documentation

## 2012-11-25 ENCOUNTER — Ambulatory Visit (INDEPENDENT_AMBULATORY_CARE_PROVIDER_SITE_OTHER): Payer: Medicaid Other | Admitting: *Deleted

## 2012-11-25 DIAGNOSIS — Z7901 Long term (current) use of anticoagulants: Secondary | ICD-10-CM

## 2012-11-25 DIAGNOSIS — I82629 Acute embolism and thrombosis of deep veins of unspecified upper extremity: Secondary | ICD-10-CM

## 2012-11-25 LAB — POCT INR: INR: 2.9

## 2012-11-30 ENCOUNTER — Emergency Department (HOSPITAL_COMMUNITY): Payer: Medicaid Other

## 2012-11-30 ENCOUNTER — Emergency Department (HOSPITAL_COMMUNITY)
Admission: EM | Admit: 2012-11-30 | Discharge: 2012-12-01 | Disposition: A | Discharge status: Home / Self Care | Payer: Medicaid Other | Attending: Emergency Medicine | Admitting: Emergency Medicine

## 2012-11-30 ENCOUNTER — Encounter (HOSPITAL_COMMUNITY): Payer: Self-pay | Admitting: Nurse Practitioner

## 2012-11-30 ENCOUNTER — Ambulatory Visit: Payer: Medicaid Other | Attending: Orthopaedic Surgery | Admitting: Physical Therapy

## 2012-11-30 DIAGNOSIS — Z7901 Long term (current) use of anticoagulants: Secondary | ICD-10-CM | POA: Insufficient documentation

## 2012-11-30 DIAGNOSIS — G40909 Epilepsy, unspecified, not intractable, without status epilepticus: Secondary | ICD-10-CM | POA: Insufficient documentation

## 2012-11-30 DIAGNOSIS — E039 Hypothyroidism, unspecified: Secondary | ICD-10-CM | POA: Insufficient documentation

## 2012-11-30 DIAGNOSIS — R011 Cardiac murmur, unspecified: Secondary | ICD-10-CM | POA: Insufficient documentation

## 2012-11-30 DIAGNOSIS — M25676 Stiffness of unspecified foot, not elsewhere classified: Secondary | ICD-10-CM | POA: Insufficient documentation

## 2012-11-30 DIAGNOSIS — M25579 Pain in unspecified ankle and joints of unspecified foot: Secondary | ICD-10-CM | POA: Insufficient documentation

## 2012-11-30 DIAGNOSIS — E785 Hyperlipidemia, unspecified: Secondary | ICD-10-CM | POA: Insufficient documentation

## 2012-11-30 DIAGNOSIS — G4730 Sleep apnea, unspecified: Secondary | ICD-10-CM | POA: Insufficient documentation

## 2012-11-30 DIAGNOSIS — Z8679 Personal history of other diseases of the circulatory system: Secondary | ICD-10-CM | POA: Insufficient documentation

## 2012-11-30 DIAGNOSIS — R269 Unspecified abnormalities of gait and mobility: Secondary | ICD-10-CM | POA: Insufficient documentation

## 2012-11-30 DIAGNOSIS — A084 Viral intestinal infection, unspecified: Secondary | ICD-10-CM

## 2012-11-30 DIAGNOSIS — IMO0001 Reserved for inherently not codable concepts without codable children: Secondary | ICD-10-CM | POA: Insufficient documentation

## 2012-11-30 DIAGNOSIS — A088 Other specified intestinal infections: Secondary | ICD-10-CM | POA: Insufficient documentation

## 2012-11-30 DIAGNOSIS — Z8639 Personal history of other endocrine, nutritional and metabolic disease: Secondary | ICD-10-CM | POA: Insufficient documentation

## 2012-11-30 DIAGNOSIS — Z8669 Personal history of other diseases of the nervous system and sense organs: Secondary | ICD-10-CM | POA: Insufficient documentation

## 2012-11-30 DIAGNOSIS — Z8659 Personal history of other mental and behavioral disorders: Secondary | ICD-10-CM | POA: Insufficient documentation

## 2012-11-30 DIAGNOSIS — Z862 Personal history of diseases of the blood and blood-forming organs and certain disorders involving the immune mechanism: Secondary | ICD-10-CM | POA: Insufficient documentation

## 2012-11-30 DIAGNOSIS — Z8709 Personal history of other diseases of the respiratory system: Secondary | ICD-10-CM | POA: Insufficient documentation

## 2012-11-30 DIAGNOSIS — Z79899 Other long term (current) drug therapy: Secondary | ICD-10-CM | POA: Insufficient documentation

## 2012-11-30 DIAGNOSIS — M25673 Stiffness of unspecified ankle, not elsewhere classified: Secondary | ICD-10-CM | POA: Insufficient documentation

## 2012-11-30 LAB — COMPREHENSIVE METABOLIC PANEL
ALT: 22 U/L (ref 0–53)
Albumin: 3.3 g/dL — ABNORMAL LOW (ref 3.5–5.2)
Alkaline Phosphatase: 73 U/L (ref 39–117)
Chloride: 106 mEq/L (ref 96–112)
GFR calc Af Amer: >90 mL/min (ref >90)
Glucose, Bld: 101 mg/dL — ABNORMAL HIGH (ref 70–99)
Potassium: 3.4 mEq/L — ABNORMAL LOW (ref 3.5–5.1)
Sodium: 138 mEq/L (ref 135–145)
Total Bilirubin: 0.3 mg/dL (ref 0.3–1.2)
Total Protein: 6.7 g/dL (ref 6.0–8.3)

## 2012-11-30 LAB — CBC WITH DIFFERENTIAL/PLATELET
Basophils Absolute: 0 10*3/uL (ref 0.0–0.1)
Basophils Relative: 0 % (ref 0–1)
Eosinophils Relative: 0 % (ref 0–5)
HCT: 34.8 % — ABNORMAL LOW (ref 39.0–52.0)
MCHC: 34.5 g/dL (ref 30.0–36.0)
MCV: 83.9 fL (ref 78.0–100.0)
Monocytes Absolute: 0.1 10*3/uL (ref 0.1–1.0)
Platelets: 221 10*3/uL (ref 150–400)
RDW: 13.6 % (ref 11.5–15.5)

## 2012-11-30 LAB — LIPASE, BLOOD: Lipase: 20 U/L (ref 11–59)

## 2012-11-30 MED ORDER — HEPARIN SOD (PORK) LOCK FLUSH 100 UNIT/ML IV SOLN
500.0000 [IU] | Freq: Once | INTRAVENOUS | Status: AC
  Administered 2012-11-30: 500 [IU] via INTRAVENOUS
  Filled 2012-11-30: qty 5

## 2012-11-30 MED ORDER — SODIUM CHLORIDE 0.9 % IV SOLN
INTRAVENOUS | Status: DC
  Administered 2012-11-30: via INTRAVENOUS

## 2012-11-30 MED ORDER — PROMETHAZINE HCL 25 MG/ML IJ SOLN
12.5000 mg | INTRAMUSCULAR | Status: DC | PRN
  Administered 2012-11-30: 12.5 mg via INTRAVENOUS
  Filled 2012-11-30: qty 1

## 2012-11-30 MED ORDER — SODIUM CHLORIDE 0.9 % IV BOLUS (SEPSIS)
500.0000 mL | Freq: Once | INTRAVENOUS | Status: AC
  Administered 2012-11-30: 500 mL via INTRAVENOUS

## 2012-11-30 MED ORDER — ONDANSETRON HCL 4 MG PO TABS: 4.0000 mg | ORAL_TABLET | Freq: Four times a day (QID) | ORAL | Status: DC

## 2012-11-30 NOTE — ED Notes (Signed)
Patient back from xray. Placed on monitor.

## 2012-11-30 NOTE — ED Notes (Signed)
Pt mother reports he has been vomiting since 1000 today and has been unable to hold any of his medications down. Dr Sharene Skeans sent pt to er for admission for seizure precautions. Mother states pt has been lethargic today.

## 2012-11-30 NOTE — ED Notes (Signed)
IV team notified for assess port

## 2012-11-30 NOTE — ED Notes (Signed)
MD at bedside. 

## 2012-11-30 NOTE — ED Notes (Signed)
Mother administerd patient's home medications per Dr. Radford Pax. Nursing to monitor for tolerance

## 2012-11-30 NOTE — ED Notes (Signed)
Phoned radiology, patient ready for xray

## 2012-11-30 NOTE — ED Notes (Signed)
Meredeth Ide., RN will access pt's port. Will d/c request from IV team

## 2012-11-30 NOTE — ED Notes (Signed)
Report from mother at bedside. Patient began to have episodes of vomiting since around 1030 am. Took morning medications around 0730 am. Unable to keep anything down since onset of vomiting. Recently started new medications. Patient vomited 1330 medications and has not taken 1730 dose of medications. Neurologist, Dr. Gavin Potters, concerned for increased risk of seizures and advised patient to report to ED for evaluation/admission.

## 2012-11-30 NOTE — ED Notes (Signed)
Patient transported to X-ray 

## 2012-11-30 NOTE — ED Provider Notes (Addendum)
History     CSN: [id number]  Arrival date & time 11/30/12  1851   First MD Initiated Contact with Patient 11/30/12 1922      Chief Complaint  Patient presents with  . Emesis     HPI Pt mother reports he has been vomiting since 1000 today and has been unable to hold any of his medications down. Dr Sharene Skeans sent pt to er for admission for seizure precautions. Mother states pt has been lethargic today.  Past Medical History  Diagnosis Date  . ALLERGIC RHINITIS 12/26/2009  . MITRAL VALVE PROLAPSE 06/22/2007  . SEIZURE DISORDER 06/22/2007  . Unspecified hypothyroidism 06/22/2007  . Growth disorder   . Mental retardation, mild (I.Q. 50-70)   . Brown's syndrome   . Heart murmur     moderate MR  . Sleep apnea     cpap , last sleep study 10/01/11  . Hypokalemia   . Atrial fibrillation     During port removal and attempted placement in July 2013, transientt  . Hyperlipidemia     No therapy  . Blood clot in vein     Past Surgical History  Procedure Laterality Date  . Implantation vagal nerve stimulator      Left  . Portacath placement      and removal 04/08/12  . Eye surgery      X2 for Brown Syndrome    Family History  Problem Relation Age of Onset  . Breast cancer Mother   . Asthma Father   . Diabetes Father   . Coronary artery disease Maternal Grandfather   . Coronary artery disease Paternal Grandfather     History  Substance Use Topics  . Smoking status: Never Smoker   . Smokeless tobacco: Never Used  . Alcohol Use: No      Review of Systems  All other systems reviewed and are negative.    Allergies  Divalproex sodium; Phenytoin; and Vancomycin  Home Medications   Current Outpatient Rx  Name  Route  Sig  Dispense  Refill  . carbamazepine (TEGRETOL) 100 MG chewable tablet   Oral   Chew 250 mg by mouth 3 (three) times daily. 1 tab at 7a, 1 tab at 1;30p, and 1 tab at 9p         . Clobazam ([name]) 5 MG TABS   Oral   Take 5 mg by mouth 2 (two) times  daily.         . diazepam (VALIUM) 2 MG tablet   Oral   Take 2 mg by mouth as needed (seizure).          . Diazepam 10 MG/2ML DEVI   Intramuscular   Inject 10 mg into the muscle daily as needed. For seizures.         Marland Kitchen guaiFENesin-dextromethorphan (ROBITUSSIN DM) 100-10 MG/5ML syrup   Oral   Take 10 mLs by mouth 3 (three) times daily as needed. For cough.         . lacosamide (VIMPAT) 50 MG TABS   Oral   Take 50-100 mg by mouth 3 (three) times daily. Patient uses 50 mg in the morning and 50 mg in the evening and 100 mg at 9:30.         . levETIRAcetam (KEPPRA) 500 MG tablet   Oral   Take 1,500 mg by mouth 2 (two) times daily.         Marland Kitchen levothyroxine (SYNTHROID, LEVOTHROID) 50 MCG tablet   Oral   Take  50 mcg by mouth every morning.          Marland Kitchen LORazepam (ATIVAN) 2 MG/ML concentrated solution   Oral   Take 2 mg by mouth every 8 (eight) hours as needed. It would be given by IV         . Multiple Vitamin (MULTIVITAMIN WITH MINERALS) TABS   Oral   Take 1 tablet by mouth daily.         . phenobarbital (LUMINAL) 60 MG/ML injection   Intravenous   Inject 65 mg into the vein daily as needed. For seizures         . potassium chloride SA (K-DUR,KLOR-CON) 20 MEQ tablet   Oral   Take 20 mEq by mouth 4 (four) times daily.         . potassium phosphate, monobasic, (K-PHOS) 500 MG tablet   Oral   Take 500 mg by mouth 4 (four) times daily. Take at 7a, 130p, 5p, and 9p         . topiramate (TOPAMAX) 100 MG tablet   Oral   Take 100-150 mg by mouth 3 (three) times daily. Take 1 tab at 7am, 1 tab at 1:30pm, and 1.5 tabs at  9pm.         . warfarin (COUMADIN) 5 MG tablet   Oral   Take 2.5-5 mg by mouth daily at 6 PM. Take 2.5 mg on tuesdays and saturdays. Take 5 mg on the rest of the week.         . ondansetron (ZOFRAN) 4 MG tablet   Oral   Take 1 tablet (4 mg total) by mouth every 6 (six) hours.   12 tablet   0     BP 106/68  Pulse 79  Temp(Src)  98.4 F (36.9 C) (Oral)  Resp 20  SpO2 100%  Physical Exam  Nursing note and vitals reviewed. Constitutional: He is oriented to person, place, and time. He appears well-developed and well-nourished. No distress.  HENT:  Head: Normocephalic and atraumatic.  Eyes: Pupils are equal, round, and reactive to light.  Neck: Normal range of motion.  Cardiovascular: Normal rate and intact distal pulses.   Pulmonary/Chest: No respiratory distress.  Abdominal: Normal appearance. He exhibits no distension. There is no tenderness. There is no rebound and no guarding.  Musculoskeletal: Normal range of motion.  Neurological: He is alert and oriented to person, place, and time. No cranial nerve deficit.  Skin: Skin is warm and dry. No rash noted.  Psychiatric: He has a normal mood and affect. His behavior is normal.    ED Course  Procedures (including critical care time) Discussed the case extensively with Dr.Hickling who is his  primary neurologist.  Plan at this time is to hydrate patient and give by mouth challenge.  If he fails he will need to be admitted if he passes he can go home anti-emetics   Patient tolerated by mouth challenge and was able hold medications down.  He feels back to baseline and wants to go home.  Mother is okay with this plan. Labs Reviewed  COMPREHENSIVE METABOLIC PANEL - Abnormal; Notable for the following:    Potassium 3.4 (*)    Glucose, Bld 101 (*)    Albumin 3.3 (*)    All other components within normal limits  CBC WITH DIFFERENTIAL - Abnormal; Notable for the following:    WBC 3.2 (*)    RBC 4.15 (*)    Hemoglobin 12.0 (*)    HCT 34.8 (*)  All other components within normal limits  CARBAMAZEPINE LEVEL, TOTAL  LIPASE, BLOOD   Dg Abd Acute W/chest  11/30/2012  *RADIOLOGY REPORT*  Clinical Data: Abdominal pain  ACUTE ABDOMEN SERIES (ABDOMEN 2 VIEW & CHEST 1 VIEW)  Comparison: Chest radiograph 05/16/2012  Findings: Neural stimulator left chest wall with lead  extending into the left cervical region. Right subclavian [name] with tip projecting over cavoatrial junction. Stable heart size and mediastinal contours with slight prominence the superior mediastinum unchanged. Lungs clear. No pleural effusion or pneumothorax. Nonobstructive bowel gas pattern. No bowel dilatation, bowel wall thickening or free intraperitoneal air. Bones demineralized. No urinary tract calcification.  IMPRESSION: No acute abnormalities.   Original Report Authenticated By: Ulyses Southward, M.D.      1. Viral gastroenteritis       MDM  The patient's mother asked if we could discharge the patient with the port access for her in case he had seizures at home.  She's been trained for use of the access port with Ativan for seizures.  She said she's been allowed to do this in the past.  I felt this was a reasonable request and patient was discharged the port accessed.       Nelia Shi, MD 11/30/12 2250  Nelia Shi, MD 11/30/12 [phone]

## 2012-11-30 NOTE — ED Notes (Signed)
edp please call Dr Sharene Skeans about pt care [phone]

## 2012-12-01 NOTE — ED Notes (Signed)
Patient discharged with accessed right chest port. Ok per Dr. Radford Pax. Patient's mother knowledgeable on medication administration via port and port maintenance.

## 2012-12-06 ENCOUNTER — Ambulatory Visit: Payer: Medicaid Other | Admitting: Physical Therapy

## 2012-12-08 ENCOUNTER — Other Ambulatory Visit: Payer: Self-pay | Admitting: Pediatrics

## 2012-12-09 ENCOUNTER — Ambulatory Visit (INDEPENDENT_AMBULATORY_CARE_PROVIDER_SITE_OTHER): Payer: Medicaid Other

## 2012-12-09 DIAGNOSIS — Z7901 Long term (current) use of anticoagulants: Secondary | ICD-10-CM

## 2012-12-09 DIAGNOSIS — I82629 Acute embolism and thrombosis of deep veins of unspecified upper extremity: Secondary | ICD-10-CM

## 2012-12-09 LAB — CBC WITH DIFFERENTIAL/PLATELET
Eosinophils Absolute: 0 10*3/uL (ref 0.0–0.7)
Eosinophils Relative: 1 % (ref 0–5)
HCT: 36.7 % — ABNORMAL LOW (ref 39.0–52.0)
Hemoglobin: 12.4 g/dL — ABNORMAL LOW (ref 13.0–17.0)
Lymphs Abs: 1.7 10*3/uL (ref 0.7–4.0)
MCH: 27.5 pg (ref 26.0–34.0)
MCV: 81.4 fL (ref 78.0–100.0)
Monocytes Absolute: 0.2 10*3/uL (ref 0.1–1.0)
Monocytes Relative: 6 % (ref 3–12)
RBC: 4.51 MIL/uL (ref 4.22–5.81)

## 2012-12-09 LAB — PATHOLOGIST SMEAR REVIEW

## 2012-12-09 LAB — TOPIRAMATE LEVEL: Topiramate Lvl: 6.5 ug/mL (ref 2.0–25.0)

## 2012-12-10 ENCOUNTER — Telehealth: Payer: Self-pay | Admitting: Pediatrics

## 2012-12-10 MED ORDER — ONFI 10 MG PO TABS: 10.0000 mg | ORAL_TABLET | Freq: Three times a day (TID) | ORAL | Status: DC

## 2012-12-10 NOTE — Telephone Encounter (Signed)
I called Mom. She said that [name] continued to be as ataxic as when I saw him on 12/06/12. Discussed [name]' lab results with Dr Sharene Skeans. We will reduce his [name] dose to 5mg  TID. I asked his mother to call me on Monday. She agreed to this plan.

## 2012-12-10 NOTE — Telephone Encounter (Signed)
Laboratory studies on [name] from December 08, 2012.  White blood cell count 2800, hemoglobin 12.4, hematocrit 36.7, MCV 81.4, platelet count 230,000, absolute granulocytes 800, ALT 19, carbamazepine 10.5 mcg/mL, topiramate 6.5 mcg/mL.  These don't appear to be toxic.

## 2012-12-13 ENCOUNTER — Telehealth: Payer: Self-pay | Admitting: Family

## 2012-12-13 ENCOUNTER — Ambulatory Visit: Payer: Medicaid Other | Admitting: Physical Therapy

## 2012-12-13 NOTE — Telephone Encounter (Signed)
I spoke with mother.  She says that he is steadily getting better than he was on higher doses of [name].  We will leave things as they are and observe his seizure frequency.

## 2012-12-13 NOTE — Telephone Encounter (Signed)
Mom DJ Colao called to report that [name] is walking with less ataxia since the decrease in [name] from [facility] 5mg  4 times per day to 3 times per day. We decreased the dose Friday 3/21 afternoon and he began to be able to ambulate independently on Sunday 03/23.  His last seizure occurred on Friday and was a short generalized seizure. He feels better as well and is more alert. Mom asked if you want to make any other changes at this time?

## 2012-12-15 ENCOUNTER — Ambulatory Visit: Payer: Medicaid Other | Admitting: Physical Therapy

## 2012-12-20 ENCOUNTER — Ambulatory Visit: Payer: Medicaid Other | Admitting: Physical Therapy

## 2012-12-20 ENCOUNTER — Ambulatory Visit (INDEPENDENT_AMBULATORY_CARE_PROVIDER_SITE_OTHER): Payer: Medicaid Other | Admitting: *Deleted

## 2012-12-20 DIAGNOSIS — I82629 Acute embolism and thrombosis of deep veins of unspecified upper extremity: Secondary | ICD-10-CM

## 2012-12-20 DIAGNOSIS — Z7901 Long term (current) use of anticoagulants: Secondary | ICD-10-CM

## 2012-12-21 DIAGNOSIS — R269 Unspecified abnormalities of gait and mobility: Secondary | ICD-10-CM

## 2012-12-21 DIAGNOSIS — Z13220 Encounter for screening for lipoid disorders: Secondary | ICD-10-CM

## 2012-12-21 DIAGNOSIS — W1809XA Striking against other object with subsequent fall, initial encounter: Secondary | ICD-10-CM

## 2012-12-21 DIAGNOSIS — F70 Mild intellectual disabilities: Secondary | ICD-10-CM

## 2012-12-21 DIAGNOSIS — I82B19 Acute embolism and thrombosis of unspecified subclavian vein: Secondary | ICD-10-CM | POA: Insufficient documentation

## 2012-12-21 DIAGNOSIS — I8290 Acute embolism and thrombosis of unspecified vein: Secondary | ICD-10-CM | POA: Insufficient documentation

## 2012-12-21 DIAGNOSIS — G4733 Obstructive sleep apnea (adult) (pediatric): Secondary | ICD-10-CM

## 2012-12-21 DIAGNOSIS — G40119 Localization-related (focal) (partial) symptomatic epilepsy and epileptic syndromes with simple partial seizures, intractable, without status epilepticus: Secondary | ICD-10-CM

## 2012-12-21 DIAGNOSIS — G40319 Generalized idiopathic epilepsy and epileptic syndromes, intractable, without status epilepticus: Secondary | ICD-10-CM

## 2012-12-21 DIAGNOSIS — T82598A Other mechanical complication of other cardiac and vascular devices and implants, initial encounter: Secondary | ICD-10-CM

## 2012-12-21 DIAGNOSIS — Z79899 Other long term (current) drug therapy: Secondary | ICD-10-CM

## 2012-12-21 DIAGNOSIS — R634 Abnormal weight loss: Secondary | ICD-10-CM

## 2012-12-21 DIAGNOSIS — I82C19 Acute embolism and thrombosis of unspecified internal jugular vein: Secondary | ICD-10-CM | POA: Insufficient documentation

## 2012-12-27 ENCOUNTER — Encounter: Payer: Self-pay | Admitting: Pediatrics

## 2012-12-27 ENCOUNTER — Ambulatory Visit (INDEPENDENT_AMBULATORY_CARE_PROVIDER_SITE_OTHER): Payer: Medicaid Other | Admitting: Pediatrics

## 2012-12-27 VITALS — BP 96/70 | HR 96 | Ht 62.5 in | Wt 104.4 lb

## 2012-12-27 DIAGNOSIS — E876 Hypokalemia: Secondary | ICD-10-CM

## 2012-12-27 DIAGNOSIS — G40319 Generalized idiopathic epilepsy and epileptic syndromes, intractable, without status epilepticus: Secondary | ICD-10-CM

## 2012-12-27 DIAGNOSIS — G40119 Localization-related (focal) (partial) symptomatic epilepsy and epileptic syndromes with simple partial seizures, intractable, without status epilepticus: Secondary | ICD-10-CM

## 2012-12-27 MED ORDER — DIAZEPAM 2 MG PO TABS: 2.0000 mg | ORAL_TABLET | ORAL | Status: DC | PRN

## 2012-12-27 MED ORDER — POTASSIUM PHOSPHATE MONOBASIC 500 MG PO TABS: 500.0000 mg | ORAL_TABLET | Freq: Four times a day (QID) | ORAL | Status: DC

## 2012-12-27 MED ORDER — LACOSAMIDE 50 MG PO TABS: ORAL_TABLET | ORAL | Status: DC

## 2012-12-27 MED ORDER — CARBAMAZEPINE 100 MG PO CHEW: 250.0000 mg | CHEWABLE_TABLET | Freq: Three times a day (TID) | ORAL | Status: DC

## 2012-12-27 MED ORDER — CARBAMAZEPINE 100 MG PO CHEW: CHEWABLE_TABLET | ORAL | Status: DC

## 2012-12-27 NOTE — Progress Notes (Signed)
Patient: [name] MRN: [id number] Sex: male DOB: 04-Aug-1986  Provider: Deetta Perla, MD Location of Care: [facility]  Note type: Routine return visit  History of Present Illness: Referral Source: [facility] History from: mother and sibling, patient and [facility] chart Chief Complaint: VNS Check/Seizures  [name] is a 27 y.o. male who returns for evaluation and management of intractable mixed seizures.  [name]  returns today for the first time since October 01, 2012.  [name] is 27 years old.  I followed him since he was 8.  He has an extensive history that is summarized below.  Recently, his history has been one of intermittent controlled seizures.  He has gone five weeks now without having any seizures other than perhaps a brief self-limited cluster of sensory seizures.  Recently he had problems with ataxia and vomiting that was related to [facility].  [name] was decreased slightly and his symptoms improved.  He had a cluster of somatosensory seizures on November 18, 2012.  He used the auto-injector for Valium on October 21, 2012.  By and large, his health has been good.  He has gained 1.8 pounds, which is a good sign.  He is physically stable.  On his last visit, he had no seizures between September 02, 2012 and September 23, 2012.  His most recent drug levels were carbamazepine 10.5 mcg/mL, topiramate 6.5 mcg/mL.  Interrogation of Vagal Nerve Stimulator  Implantation of the current stimulator on December 09, 2005 Model 102 A serial number [id number]  Interrogation of the vagal nerve stimulator shows that there have been no magnet stimuli since October 01, 2012 (total of 1117) Voltage 2.25 mA.  Frequency 20 Hz, pulse width 250 microseconds, signal time on 7 seconds, signal time off 30 seconds, magnet current 2.50 mA, signal time on 60 seconds, pulse width 250 microseconds.  The normal mode diagnostics using his current settings was normal, DC-DC  conversion code 3.  His vagal nerve stimulator was interrogated today and is working well.  No other concerns were raised.  Review of Systems: 12 system review was remarkable for Feeling Cold, Allergies, Runny Nose and Seizure  Past Medical History  Diagnosis Date  . ALLERGIC RHINITIS 12/26/2009  . MITRAL VALVE PROLAPSE 06/22/2007  . SEIZURE DISORDER 06/22/2007  . Unspecified hypothyroidism 06/22/2007  . Growth disorder   . Mental retardation, mild (I.Q. 50-70)   . Brown's syndrome   . Heart murmur     moderate MR  . Sleep apnea     cpap , last sleep study 10/01/11  . Hypokalemia   . Atrial fibrillation     During port removal and attempted placement in July 2013, transientt  . Hyperlipidemia     No therapy  . Blood clot in vein    Hospitalizations: yes, Head Injury: no, Nervous System Infections: no, Immunizations up to date: yes Past Medical History Comments:   Diagnosis of developmental delay as a toddler.  He was a breech presentation, cesarean section delivery.  He  had intrauterine growth retardation, and failure to thrive.  EEG 11/07/86 was normal for gestational age.  Genetics evaluation short stature, prenatal onset, chromosomal study: 54 XY, fragile X syndrome negative, evaluation for MELAS and MERRF were negative.  Initial seizure 08/28/94 left locomotor with secondary generalization MRI of the brain 09/13/93: Scattered subcortical white matter lesions at the parietotemporal parieto-occipital junction is ischemic versus hamartomas. Diagnosis of hypothyroidism, and growth hormone deficiency December 1994: Treated with Protropin, and Synthroid Diagnosis  attention deficit disorder inattentive type treated without success with Cylert April 1995 MRI brain 04/04/94 stable differential diagnosis hamartoma, vasculitis, ischemic, or embolic phenomenon status epilepticus 01/31/95, and 04/15/95 Neurontin added to Tegretol MRI brain 06/14/96 unchanged admission to [facility].   Tegretol 30 mcg for militate.  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 Topiramate started in May 1999 unable to be tolerated with Tegretol  and was tapered and discontinued January 2000 EEG showed right greater than left mid temporal sharp waves was otherwise well-organized EEG.  He hospitalizations in March, July, October, in November for recurrent seizures. Patient placed on Depakote in April 2000 and developed gagging abdominal pain headache and had significant change in transaminases and liver functions.  Topamax is restarted and  Keppra was added.  Behavior History none  Surgical History Past Surgical History  Procedure Laterality Date  . Implantation vagal nerve stimulator      Left  . Portacath placement      and removal 04/08/12  . Eye surgery      X2 for Brown Syndrome   Family History family history includes Asthma in his father; Breast cancer in his mother; Coronary artery disease in his maternal grandfather and paternal grandfather; and Diabetes in his father. Family History is negative migraines, seizures, cognitive impairment, blindness, deafness, birth defects, chromosomal disorder, autism.  Social History History   Social History  . Marital Status: Single    Spouse Name: N/A    Number of Children: 0  . Years of Education: N/A   Occupational History  . Disabled    Social History Main Topics  . Smoking status: Never Smoker   . Smokeless tobacco: Never Used  . Alcohol Use: No  . Drug Use: No  . Sexually Active: No   Other Topics Concern  . None   Social History Narrative  . None   Educational level Graduate of  special education high school Occupation: N/A Living with both parents  Hobbies/Interest: Watching sports in particular [employer]CAR  Current Outpatient Prescriptions on File Prior to Visit  Medication Sig Dispense Refill  . Diazepam 10 MG/2ML DEVI Inject 10 mg into the muscle daily as needed. For seizures.      Marland Kitchen guaiFENesin-dextromethorphan (ROBITUSSIN DM) 100-10 MG/5ML syrup Take 10 mLs by mouth 3 (three) times daily as needed. For cough.      . levETIRAcetam (KEPPRA) 500 MG tablet Take 1,500 mg by mouth 2 (two) times daily.      Marland Kitchen levothyroxine (SYNTHROID, LEVOTHROID) 50 MCG tablet Take 50 mcg by mouth every morning.       Marland Kitchen LORazepam (ATIVAN) 2 MG/ML concentrated solution Take 2 mg by mouth every 8 (eight) hours as needed. It would be given by IV      . Multiple Vitamin (MULTIVITAMIN WITH MINERALS) TABS Take 1 tablet by mouth daily.      . [name] 10 MG TABS Take 10 mg by mouth 3 (three) times daily. Take 1/2 of 10mg  tablet 3 times per day  60 tablet  0  . phenobarbital (LUMINAL) 60 MG/ML injection Inject 65 mg into the vein daily as needed. For seizures      . potassium chloride SA (K-DUR,KLOR-CON)  20 MEQ tablet Take 20 mEq by mouth 4 (four) times daily.      Marland Kitchen topiramate (TOPAMAX) 100 MG tablet Take 100-150 mg by mouth 3 (three) times daily. Take 1 tab at 7am, 1 tab at 1:30pm, and 1.5 tabs at  9pm.      . warfarin (COUMADIN) 5 MG tablet Take 2.5-5 mg by mouth daily at 6 PM. Take 2.5 mg on tuesdays and saturdays. Take 5 mg on the rest of the week.      . ondansetron (ZOFRAN) 4 MG tablet Take 1 tablet (4 mg total) by mouth every 6 (six) hours.  12 tablet  0   No current facility-administered medications on file prior to visit.   The medication list was reviewed and reconciled. All changes or newly prescribed medications were explained.  A complete medication list was provided to the patient/caregiver.  Allergies  Allergen Reactions  . Divalproex Sodium Other (See Comments)    Nausea, vomiting,  liver and kidney dysfunction  . Phenytoin Rash    Hypersensitivity  reaction  . Vancomycin Rash    Redman's Syndrome   Physical Exam BP 96/70  Pulse 96  Ht 5' 2.5" (1.588 m)  Wt 104 lb 6.4 oz (47.356 kg)  BMI 18.78 kg/m2  General: thin young man, seated on exam table, in no evident distress, right-handed, brown hair, brown eyes Head: head  microcephalic and atraumatic,  He has his head turned with the right ear toward his right shoulder and chin points the left.  He does not have torticollis Ears, Nose and Throat: beak-like nose. He has a low lying palate.  Neck: supple with no carotid or supraclavicular bruits. He complains of soreness with palpation of the cervical lymph nodes Respiratory: Lungs are clear to auscultation Cardiovascular: regular rate and rhythm,  soft holosystolic murmur at left sternal border.   His nailbeds are pink. Musculoskeletal: short stature with head tilted to the right. He has tight heel cords. Skin: no rashes or lesions. Trunk: no scoliosis. His portacath in the right upper chest.   He has a vagal nerve stimulator in the left upper chest.  Neurologic Exam  Mental Status: Awake and fully alert.  Oriented to place and time. Attention span, concentration, and fund of knowledge appropriate for him. He has evidence of mild cognitive impairment.  Mood and affect appropriate. He is tired but interactive with the examiner and his family. He has dysarthria but is intelligible Cranial Nerves: Fundoscopic exam reveals sharp disc margins with  bilateral optic nerve hypoplasia.  Pupils equal, briskly reactive to light.  Extraocular movements full but dysconjugate.  Visual fields full to confrontation.  Hearing intact and symmetric to whisper.  Facial sensation intact.  Face, tongue, palate move normally and symmetrically.  Neck flexion and extension normal.  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 Motor: Diminished bulk and normal tone.  The  patient has mild weakness in his deltoids and biceps in the 4+ over 5 range.He had normal strength in his legs.  He has clumsy fine motor movements. Sensory: Intact to light touch,cold, vibration, and proprioception in all extremities; good stereognosis Coordination: Rapid alternating movements are clumsy in all extremities.  Finger-to-nose and heel-to-shin performed accurately bilaterally. Romberg negative. Gait and Station: Arises from the exam tablewithout difficulty.  Stance is wide based.  Gait demonstrates ataxic stride length and fair balance.  Able to heel, toe, and tandem walk with difficulty. He is toe walking on the left, and  has tight heel cords bilaterally. Reflexes: Absent and symmetric.  Toes downgoing.  Assessment and Plan  1. [name] has localization related seizures that are somatosensory in nature and recently had been under better control though they are termed intractable (345.51). 2. He has generalized tonic-clonic seizures at times with apnea and cyanosis (345.11) 3. Hypokalemia (276.8). 4. Gait disorder (781.2). 5. Mild cognitive impairment (317).  Plan: Changes are not going to be made in his current medications.  He takes Topamax, carbamazepine, Keppra, Vimpat , and [name].  His combination of medications has brought about good control.  It is debatable whether or not we can taper and discontinue any of his medicines.  Attempts to do this in the past have resulted in increased seizure frequency.  We had been able to come down somewhat on his carbamazepine since [name] was started.  He will continue to receive injections from the RESCUE trial when he has clusters of seizures.  He has a [name] that fortunately we have not had to use recently.  I spent 30 minutes of face-to-face time with the patient more than half of it in consultation.  Deetta Perla MD

## 2012-12-27 NOTE — Patient Instructions (Signed)
Take your medications as ordered.  Call me for further seizures or side effects.

## 2013-01-05 ENCOUNTER — Ambulatory Visit (INDEPENDENT_AMBULATORY_CARE_PROVIDER_SITE_OTHER): Payer: Medicaid Other | Admitting: *Deleted

## 2013-01-05 DIAGNOSIS — I82629 Acute embolism and thrombosis of deep veins of unspecified upper extremity: Secondary | ICD-10-CM

## 2013-01-05 DIAGNOSIS — Z7901 Long term (current) use of anticoagulants: Secondary | ICD-10-CM

## 2013-01-12 ENCOUNTER — Ambulatory Visit (INDEPENDENT_AMBULATORY_CARE_PROVIDER_SITE_OTHER): Payer: Medicaid Other | Admitting: *Deleted

## 2013-01-12 VITALS — Wt 104.0 lb

## 2013-01-12 DIAGNOSIS — Z0289 Encounter for other administrative examinations: Secondary | ICD-10-CM

## 2013-01-12 DIAGNOSIS — G40909 Epilepsy, unspecified, not intractable, without status epilepticus: Secondary | ICD-10-CM

## 2013-01-14 ENCOUNTER — Telehealth: Payer: Self-pay | Admitting: Pediatrics

## 2013-01-14 NOTE — Telephone Encounter (Signed)
Message copied by Deetta Perla on Fri Jan 14, 2013  3:46 PM ------      Message from: Cherre Blanc      Created: Fri Jan 14, 2013  9:53 AM      Regarding: Please sign off       Hi Dr. Sharene Skeans,            Please sign off my note for [facility].  ------

## 2013-01-14 NOTE — Telephone Encounter (Signed)
[name] I am unable to sign off the note, because it is not in my schedule.  I've indicated that I agree with your actions which are according to protocol.  You will need to sign to close the encounter.

## 2013-01-14 NOTE — Progress Notes (Signed)
Patient was in for follow-up after the use of the auto-injector. The full dose was administered to the patient and the auto- injector was returned to our office. The C-SSRS was assess by [employer]. The patient has no suicidal ideation or has showed no suicidal behavior. New auto-injector [id number] was dispense to the caregiver.  I reviewed this and agree with findings.  His was carried out at [facility] not under my direct supervision, but according to protocol for the research study.

## 2013-01-26 ENCOUNTER — Ambulatory Visit (INDEPENDENT_AMBULATORY_CARE_PROVIDER_SITE_OTHER): Payer: Medicaid Other | Admitting: Pharmacist

## 2013-01-26 DIAGNOSIS — Z7901 Long term (current) use of anticoagulants: Secondary | ICD-10-CM

## 2013-01-26 DIAGNOSIS — I82629 Acute embolism and thrombosis of deep veins of unspecified upper extremity: Secondary | ICD-10-CM

## 2013-02-01 ENCOUNTER — Telehealth: Payer: Self-pay | Admitting: Pediatrics

## 2013-02-01 NOTE — Telephone Encounter (Signed)
I spoke with mother and informed her that we would need to see [name] the week of June 30 through July 3.  [name] is doing well for him.  Please having seizures, he's not having multiple clusters.  I told her that the autoinjector could be used until his next visit and could even be replaced, but after that visit, we would not be using it anymore.  The study is being closed out.

## 2013-02-06 ENCOUNTER — Other Ambulatory Visit: Payer: Self-pay | Admitting: Family

## 2013-02-07 ENCOUNTER — Ambulatory Visit (INDEPENDENT_AMBULATORY_CARE_PROVIDER_SITE_OTHER): Payer: Medicaid Other | Admitting: *Deleted

## 2013-02-07 VITALS — Wt 105.6 lb

## 2013-02-07 DIAGNOSIS — G40319 Generalized idiopathic epilepsy and epileptic syndromes, intractable, without status epilepticus: Secondary | ICD-10-CM

## 2013-02-07 NOTE — Progress Notes (Signed)
Patient was in for follow-up after the use of the auto-injector. The full dose was administered to the patient and the auto- injector was returned to our office. The C-SSRS was assess by [employer]. The patient has no suicidal ideation or has showed no suicidal behavior. New auto-injector [id number] was dispense to the caregiver.

## 2013-02-10 ENCOUNTER — Telehealth: Payer: Self-pay | Admitting: *Deleted

## 2013-02-10 NOTE — Telephone Encounter (Signed)
[name] the patient's mom called and stated that the patient is having trouble with mobility in his left ankle and is starting to walk on the side of his foot, Dr. Shona Simpson at [facility] is wanting to do surgery and the procedure is called Zipper Cut and they are wanting to administer anesthesia from the knee down and mom is wanting to know Dr. Darl Householder feelings on the matter, if he thinks it's safe or not for [name] to have the anesthesia. [name] can be reached at [phone]. Thanks, [name].

## 2013-02-10 NOTE — Telephone Encounter (Signed)
Surgery if given the okay to have will take place in July.

## 2013-02-10 NOTE — Telephone Encounter (Signed)
I spoke with [name], and there is no reason he can't have surgery on his foot.  The foot will be in a cast.  We will not make any major changes in his medicine until I see him for his close out visit in July.  He still having occasional seizures, but they seem to be manageable therapy available.

## 2013-02-17 ENCOUNTER — Telehealth: Payer: Self-pay | Admitting: Pediatrics

## 2013-02-17 NOTE — Telephone Encounter (Signed)
I would recommend that we repeat the CBC with diff next week. He had low neutrophils before and it always self-corrected.  Is this being done in response to seizures or routine prior to his closeout visit?   Let Talbert Forest know about this.  It may need to be filed as an AED even if he has not recently used the autoinjector.

## 2013-02-21 ENCOUNTER — Telehealth: Payer: Self-pay

## 2013-02-21 DIAGNOSIS — R269 Unspecified abnormalities of gait and mobility: Secondary | ICD-10-CM

## 2013-02-21 DIAGNOSIS — Z79899 Other long term (current) drug therapy: Secondary | ICD-10-CM

## 2013-02-21 DIAGNOSIS — G40319 Generalized idiopathic epilepsy and epileptic syndromes, intractable, without status epilepticus: Secondary | ICD-10-CM

## 2013-02-21 DIAGNOSIS — G40119 Localization-related (focal) (partial) symptomatic epilepsy and epileptic syndromes with simple partial seizures, intractable, without status epilepticus: Secondary | ICD-10-CM

## 2013-02-21 NOTE — Telephone Encounter (Signed)
Lets do that

## 2013-02-21 NOTE — Telephone Encounter (Signed)
No, he doesn't have an appointment scheduled at all except for a research visit follow up in July. I think that I can put him into your schedule this week if you want that. TG

## 2013-02-21 NOTE — Telephone Encounter (Signed)
I called and scheduled [name] for [facility] 02/24/13 @ 08:15 AM. Knute Neu

## 2013-02-21 NOTE — Telephone Encounter (Signed)
This is erroneous encounter. Disregard phone note. TG

## 2013-02-21 NOTE — Telephone Encounter (Signed)
I agree, we may need to see him this week, isn't he scheduled?

## 2013-02-21 NOTE — Telephone Encounter (Signed)
[name] lvm stating that [name] has been really tired lately. They are also having a hard time understanding him and his face looks puffy today. Mom is worried that it could be a blood clot or a medication issue. I called mom and she said that [name] has been sleepy, staring off,difficulties remembering,difficulty understanding him for about 3 months but more intense over the past 2 weeks. Mom said that sometimes when he's eating he'll just start staring off and she will have to tell him to eat.[name] said that she is having a hard time determining if this is sz activity or not.  She said that she is also having to remind him to take his medications. [name] said that his face has been puffy for awhile but that this morning it is puffier then usual. She is bringing him for blood work [employer]vertising account executive. Please call [name] at [phone].

## 2013-02-21 NOTE — Telephone Encounter (Signed)
I called and talked with Mom. She said that for the past couple of weeks, he has looked and acted very tired. She said that he acts "doped up", as if he has had too much medication. She said that the family has to give him multiple instructions for the same thing, and that his speech is slurred and difficult to understand at times. During things that he usually enjoys, he sleeps instead of being engaged in it as he has done in the past. It has been 3 weeks since his last seizure. Mom says that he sometimes stares into space but he doesn't seem to be having a seizure, he just seems to be staring and unaware of what the family is doing. Mom said that his face looked puffy and tired. She said that he sometimes seemed to not want to use his left hand but not because it was painful just seemed to almost forget what to do with it. She said that he continued to have staggering gait but not as bad as in the spring. She said that [name] was sleeping at night. She said that his appetite was not good and needed to be reminded to eat. I verified with Mom that he has not had blood drawn since March, other than his usual Coumadin lab draw. I will send lab orders for [name] to go to [facility] at [facility] tomorrow to have CBC, SGPT, Carbemazepine and Topiramate levels drawn. Mom agreed with this plan. TG

## 2013-02-22 ENCOUNTER — Other Ambulatory Visit: Payer: Self-pay | Admitting: Family

## 2013-02-22 LAB — CBC WITH DIFFERENTIAL/PLATELET
Basophils Absolute: 0 10*3/uL (ref 0.0–0.1)
HCT: 38.4 % — ABNORMAL LOW (ref 39.0–52.0)
Lymphocytes Relative: 63 % — ABNORMAL HIGH (ref 12–46)
Monocytes Absolute: 0.2 10*3/uL (ref 0.1–1.0)
Neutro Abs: 0.8 10*3/uL — ABNORMAL LOW (ref 1.7–7.7)
RBC: 4.57 MIL/uL (ref 4.22–5.81)
RDW: 13.5 % (ref 11.5–15.5)
WBC: 3.1 10*3/uL — ABNORMAL LOW (ref 4.0–10.5)

## 2013-02-22 LAB — CARBAMAZEPINE LEVEL, TOTAL: Carbamazepine Lvl: 10.1 ug/mL (ref 4.0–12.0)

## 2013-02-22 LAB — ALT: ALT: 25 U/L (ref 0–53)

## 2013-02-23 ENCOUNTER — Ambulatory Visit (INDEPENDENT_AMBULATORY_CARE_PROVIDER_SITE_OTHER): Payer: Medicaid Other | Admitting: *Deleted

## 2013-02-23 DIAGNOSIS — I82629 Acute embolism and thrombosis of deep veins of unspecified upper extremity: Secondary | ICD-10-CM

## 2013-02-23 DIAGNOSIS — Z7901 Long term (current) use of anticoagulants: Secondary | ICD-10-CM

## 2013-02-24 ENCOUNTER — Ambulatory Visit (INDEPENDENT_AMBULATORY_CARE_PROVIDER_SITE_OTHER): Payer: Medicaid Other | Admitting: Pediatrics

## 2013-02-24 ENCOUNTER — Encounter: Payer: Self-pay | Admitting: Pediatrics

## 2013-02-24 VITALS — BP 90/64 | HR 96 | Ht 62.5 in | Wt 107.2 lb

## 2013-02-24 DIAGNOSIS — G8190 Hemiplegia, unspecified affecting unspecified side: Secondary | ICD-10-CM

## 2013-02-24 DIAGNOSIS — G4733 Obstructive sleep apnea (adult) (pediatric): Secondary | ICD-10-CM

## 2013-02-24 DIAGNOSIS — R404 Transient alteration of awareness: Secondary | ICD-10-CM

## 2013-02-24 DIAGNOSIS — F70 Mild intellectual disabilities: Secondary | ICD-10-CM

## 2013-02-24 DIAGNOSIS — R269 Unspecified abnormalities of gait and mobility: Secondary | ICD-10-CM

## 2013-02-24 DIAGNOSIS — G40119 Localization-related (focal) (partial) symptomatic epilepsy and epileptic syndromes with simple partial seizures, intractable, without status epilepticus: Secondary | ICD-10-CM

## 2013-02-24 DIAGNOSIS — G40319 Generalized idiopathic epilepsy and epileptic syndromes, intractable, without status epilepticus: Secondary | ICD-10-CM

## 2013-02-24 NOTE — Patient Instructions (Signed)
Try to work to keep his CPAP on all night and see if he is more awake.  We may need to perform an MRI scan to look at his brain if left side continues to show weakness.

## 2013-02-24 NOTE — Progress Notes (Signed)
Patient: [name] MRN: [id number] Sex: male DOB: 03-30-1986  Provider: Deetta Perla, MD Location of Care: [facility]  Note type: Routine return visit  History of Present Illness: Referral Source: [facility] History from: mother and [facility] chart Chief Complaint: Seizures  [name] is a 27 y.o. male who returns for evaluation of excessive sleepiness, lethargy, and diminished activity.  He was last seen on December 27, 2012.  He has been followed by me since he was 8.  In general, his seizures have been in better control over the past few months than at any time in years.  Recently, however, his parents have been concerned that he has periods where he stares into space.  They have to remind him to take medications, to eat, and to carry out activities of daily living.  He seems to have lost interest in things that used to excite him.  He is excessively sleepy.  He does not want to go to [facility], a place where he had socialization with peers.  His mother is concerned that the staring spells may represent seizures.  She also is concerned that there may be something systematically wrong with him.  He has not had fever or other constitutional signs or symptoms of illness.  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  He had a sleep apnea and has been taking CPAP off, that has resulted in poor night sleep, and in my opinion the patient is exhausted and his staring spells represent excessive drowsiness.  Daytime was to keep the CPAP on and to see the relationship between wearing the CPAP, getting a good night sleep, and feeling better is challenging.  The family on their own decreased Vimpat from 50 mg in the morning, afternoon, and a 100 mg at nighttime to 50 mg, 50 mg, and 75 mg.  He seemed to be a bit more alert.  His somatosensory seizures have occurred about once a  month.  He has not had a generalized tonic-clonic seizure in several weeks.  He seems to be neglecting his left arm.  He is having difficulty walking on his left leg because of his very tight heel cord and his foot is in a boot.  He is scheduled to have Achilles tendon lengthening at [facility] on April 11, 2013.  In addition, it has become more difficult to understand the things that he says to his parents and sister.  I assessed him today and rechecked his vagal nerve stimulator, which is functioning well.  Review of Systems: 12 system review was remarkable for allergies, runny nose, seizure, too much sleep, decreased energy and disinterest in past activities.  Past Medical History  Diagnosis Date  . ALLERGIC RHINITIS 12/26/2009  . MITRAL VALVE PROLAPSE 06/22/2007  . SEIZURE DISORDER 06/22/2007  . Unspecified hypothyroidism 06/22/2007  . Growth disorder   . Mental retardation, mild (I.Q. 50-70)   . Brown's syndrome   . Heart murmur     moderate MR  . Sleep apnea     cpap , last sleep study 10/01/11  . Hypokalemia   . Atrial fibrillation     During port removal and attempted placement in July 2013, transientt  . Hyperlipidemia     No therapy  . Blood clot in vein    Hospitalizations: yes, Head Injury: no, Nervous System Infections: no, Immunizations up to date: yes  Past Medical  History Comments: Diagnosis of developmental delay as a toddler. He was a breech presentation, cesarean section delivery. He had intrauterine growth retardation, and failure to thrive. EEG 11/07/86 was normal for gestational age. Genetics evaluation short stature, prenatal onset, chromosomal study: 73 XY, fragile X syndrome negative, evaluation for MELAS and MERRF were negative.  Initial seizure 08/28/94 left locomotor with secondary generalization  MRI of the brain 09/13/93: Scattered subcortical white matter lesions at the parietotemporal parieto-occipital junction is ischemic versus hamartomas.  Diagnosis of  hypothyroidism, and growth hormone deficiency December 1994: Treated with Protropin, and Synthroid  Diagnosis attention deficit disorder inattentive type treated without success with Cylert April 1995  MRI brain 04/04/94 stable differential diagnosis hamartoma, vasculitis, ischemic, or embolic phenomenon  status epilepticus 01/31/95, and 04/15/95 Neurontin added to Tegretol  MRI brain 06/14/96 unchanged  admission to [facility]. Tegretol 30 mcg for militate. 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  Topiramate started in May 1999 unable to be tolerated with Tegretol and was tapered and discontinued  January 2000 EEG showed right greater than left mid temporal sharp waves was otherwise well-organized EEG. He hospitalizations in March, July, October, in November for recurrent seizures.  Patient placed on Depakote in April 2000 and developed gagging abdominal pain headache and had significant change in transaminases and liver functions. Topamax is restarted and Keppra was added.  Behavior History none  Surgical History Past Surgical History  Procedure Laterality Date  . Implantation vagal nerve stimulator      Left  . Portacath placement      and removal 04/08/12  . Eye surgery      X2 for Brown Syndrome   Family History family history includes Asthma in his father; Breast cancer in his mother; Coronary artery disease in his maternal grandfather and paternal grandfather; and Diabetes in his father. Family History is negative migraines, seizures, cognitive impairment, blindness, deafness, birth defects, chromosomal disorder, autism.  Social  History History   Social History  . Marital Status: Single    Spouse Name: N/A    Number of Children: 0  . Years of Education: N/A   Occupational History  . Disabled    Social History Main Topics  . Smoking status: Never Smoker   . Smokeless tobacco: Never Used  . Alcohol Use: No  . Drug Use: No  . Sexually Active: No   Other Topics Concern  . None   Social History Narrative  . None   Educational level Graduate of special education high school  Living with both parents  Hobbies/Interest: Watching sports on TV, in particular NASCAR   No current facility-administered medications on file prior to visit.   Current Outpatient Prescriptions on File Prior to Visit  Medication Sig Dispense Refill  . diazepam (VALIUM) 2 MG tablet Take 1 tablet (2 mg total) by mouth as needed (seizure).  30 tablet  0  . Diazepam 10 MG/2ML DEVI Inject 10 mg into the muscle daily as needed. For seizures.      Marland Kitchen levothyroxine (SYNTHROID, LEVOTHROID) 50 MCG tablet Take 50 mcg by mouth every morning.       Marland Kitchen LORazepam (ATIVAN) 2 MG/ML concentrated solution Take  2 mg by mouth every 8 (eight) hours as needed. It would be given by IV      . Multiple Vitamin (MULTIVITAMIN WITH MINERALS) TABS Take 1 tablet by mouth daily.      . phenobarbital (LUMINAL) 60 MG/ML injection Inject 65 mg into the vein daily as needed. For seizures      . potassium chloride SA (K-DUR,KLOR-CON) 20 MEQ tablet Take 20 mEq by mouth 4 (four) times daily.      Marland Kitchen warfarin (COUMADIN) 5 MG tablet Take 2.5-5 mg by mouth daily at 6 PM. Take 2.5 mg on tuesdays and saturdays. Take 5 mg on the rest of the week.      Marland Kitchen guaiFENesin-dextromethorphan (ROBITUSSIN DM) 100-10 MG/5ML syrup Take 10 mLs by mouth 3 (three) times daily as needed. For cough.       The medication list was reviewed and reconciled. All changes or newly prescribed medications were explained.  A complete medication list was provided to the patient/caregiver.  Allergies   Allergen Reactions  . Divalproex Sodium Other (See Comments)    Nausea, vomiting, liver and kidney dysfunction  . Phenytoin Rash    Hypersensitivity  reaction  . Vancomycin Rash    Redman's Syndrome    Physical Exam BP 90/64  Pulse 96  Ht 5' 2.5" (1.588 m)  Wt 107 lb 3.2 oz (48.626 kg)  BMI 19.28 kg/m2  General: thin young man, seated on exam table, in no evident distress, right-handed, brown hair, brown eyes  Head: head microcephalic and atraumatic, He has his head turned with the right ear toward his right shoulder and chin points the left. He does not have torticollis  Ears, Nose and Throat: beak-like nose. He has a low lying palate.  Neck: supple with no carotid or supraclavicular bruits. He complains of soreness with palpation of the cervical lymph nodes  Respiratory: Lungs are clear to auscultation  Cardiovascular: regular rate and rhythm, soft holosystolic murmur at left sternal border. His nailbeds are pink.  Musculoskeletal: short stature with head tilted to the right. He has tight heel cords.  Skin: no rashes or lesions.  Trunk: no scoliosis. His portacath in the right upper chest. He has a vagal nerve stimulator in the left upper chest.   Neurologic Exam  Mental Status: Awake with mild lethargy. Oriented to place and time. Attention span, concentration, and fund of knowledge appropriate for him. He has evidence of mild cognitive impairment. Mood and affect appropriate. He is tired but interactive with the examiner and his mother. 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 Pupils equal, briskly reactive to light. Extraocular movements full but dysconjugate. Visual fields full to confrontation. Hearing intact and symmetric to whisper. Facial sensation intact. Face, tongue, palate move normally and symmetrically. Neck flexion and extension normal. 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  Motor: Diminished bulk and normal tone. The patient has mild weakness in his deltoids and biceps in the 4+ over 5 range.He had normal strength in his legs. He has clumsy fine motor movements.  Sensory: Intact to light touch,cold, vibration, and proprioception in all extremities; good stereognosis  Coordination: Rapid alternating movements are clumsy in all extremities. Finger-to-nose and heel-to-shin performed accurately bilaterally. Romberg negative.  Gait and Station: Arises from the exam table without difficulty. Stance is wide based. Gait demonstrates ataxic stride length and fair balance. Able to heel, toe, and tandem  walk with difficulty. He is toe walking on the left, and has tight heel cords bilaterally.  Reflexes: Absent and symmetric. Toes downgoing.  Assessment 1. Simple partial somatosensory seizures, improved, but intractable, 345.51. 2. Generalized tonic-clonic seizures with apnea and cyanosis, improved, 345.11. 3. Hypokalemia. 4. Organic gait disorder, 781.2. 5. Mild cognitive impairment, 317. 6. 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 7. Obstructive sleep apnea, 327.23. 8. Excessive daytime somnolence, 780.09.  Discussion I believe that the patient's staring and excessive sleepiness are related to his lack of sleep.  I think that we are doing the best that we have in controlling his seizures and that he seems less vulnerable to those than at anytime in recent memory.  It is certainly possible that the collection of medicines that he takes are also impairing him cognitively.  Any change in those medicines has to take place slowly over time as in dropping the Vimpat.  Plan I recommended to his mother that she, his father, and sister take shifts to make certain that he keeps his CPAP on all night to see if he is more awake and alert the next day.  If that is the case, then we may have our answer for why he seems so restless, he is  interested, and sleepy.  The next step is more difficult, in that it is not possible for someone to stay up all night with the patient to keep his CPAP on.  He is wearing a device now that is the most comfortable model available.  He will be seen in followup on March 23, 2013, to close out the drug study that he is in that uses subcutaneous Valium.  I spent 40 minutes of face-to-face time with the family and in addition interrogated his vagal nerve stimulator, more than half of the time was spent in consultation.  Deetta Perla MD

## 2013-02-26 ENCOUNTER — Encounter (HOSPITAL_COMMUNITY): Payer: Self-pay | Admitting: Physical Medicine and Rehabilitation

## 2013-02-26 ENCOUNTER — Emergency Department (HOSPITAL_COMMUNITY): Payer: Medicaid Other

## 2013-02-26 ENCOUNTER — Observation Stay (HOSPITAL_COMMUNITY)
Admission: EM | Admit: 2013-02-26 | Discharge: 2013-02-28 | Disposition: A | Discharge status: Home / Self Care | Payer: Medicaid Other | Attending: Neurology | Admitting: Neurology

## 2013-02-26 DIAGNOSIS — F70 Mild intellectual disabilities: Secondary | ICD-10-CM | POA: Insufficient documentation

## 2013-02-26 DIAGNOSIS — G40319 Generalized idiopathic epilepsy and epileptic syndromes, intractable, without status epilepticus: Principal | ICD-10-CM | POA: Insufficient documentation

## 2013-02-26 DIAGNOSIS — R092 Respiratory arrest: Secondary | ICD-10-CM

## 2013-02-26 DIAGNOSIS — R569 Unspecified convulsions: Secondary | ICD-10-CM

## 2013-02-26 DIAGNOSIS — R0681 Apnea, not elsewhere classified: Secondary | ICD-10-CM | POA: Insufficient documentation

## 2013-02-26 DIAGNOSIS — Z7901 Long term (current) use of anticoagulants: Secondary | ICD-10-CM | POA: Insufficient documentation

## 2013-02-26 LAB — POCT I-STAT, CHEM 8
BUN: 15 mg/dL (ref 6–23)
Potassium: 3.6 mEq/L (ref 3.5–5.1)
Sodium: 140 mEq/L (ref 135–145)
TCO2: 21 mmol/L (ref 0–100)

## 2013-02-26 LAB — CBC WITH DIFFERENTIAL/PLATELET
Basophils Absolute: 0 10*3/uL (ref 0.0–0.1)
HCT: 38 % — ABNORMAL LOW (ref 39.0–52.0)
Lymphocytes Relative: 28 % (ref 12–46)
Lymphs Abs: 2 10*3/uL (ref 0.7–4.0)
Neutro Abs: 4.5 10*3/uL (ref 1.7–7.7)
Platelets: 222 10*3/uL (ref 150–400)
RBC: 4.46 MIL/uL (ref 4.22–5.81)
RDW: 13 % (ref 11.5–15.5)
WBC: 7.1 10*3/uL (ref 4.0–10.5)

## 2013-02-26 LAB — URINALYSIS, ROUTINE W REFLEX MICROSCOPIC
Bilirubin Urine: NEGATIVE
Glucose, UA: 100 mg/dL — AB
Ketones, ur: NEGATIVE mg/dL
Leukocytes, UA: NEGATIVE
pH: 7 (ref 5.0–8.0)

## 2013-02-26 LAB — COMPREHENSIVE METABOLIC PANEL
ALT: 29 U/L (ref 0–53)
AST: 31 U/L (ref 0–37)
Alkaline Phosphatase: 107 U/L (ref 39–117)
CO2: 19 mEq/L (ref 19–32)
Chloride: 105 mEq/L (ref 96–112)
GFR calc non Af Amer: >90 mL/min (ref >90)
Glucose, Bld: 114 mg/dL — ABNORMAL HIGH (ref 70–99)
Sodium: 135 mEq/L (ref 135–145)
Total Bilirubin: 0.3 mg/dL (ref 0.3–1.2)

## 2013-02-26 LAB — TROPONIN I: Troponin I: <0.3 ng/mL (ref <0.30)

## 2013-02-26 LAB — POCT I-STAT TROPONIN I: Troponin i, poc: 0 ng/mL (ref 0.00–0.08)

## 2013-02-26 LAB — URINE MICROSCOPIC-ADD ON

## 2013-02-26 MED ORDER — LACOSAMIDE 50 MG PO TABS
50.0000 mg | ORAL_TABLET | ORAL | Status: DC
  Administered 2013-02-26 – 2013-02-28 (×5): 50 mg via ORAL
  Filled 2013-02-26 (×4): qty 1

## 2013-02-26 MED ORDER — WARFARIN - PHYSICIAN DOSING INPATIENT: Freq: Every day | Status: DC

## 2013-02-26 MED ORDER — LEVETIRACETAM 750 MG PO TABS
1500.0000 mg | ORAL_TABLET | Freq: Once | ORAL | Status: AC
  Administered 2013-02-26: 1500 mg via ORAL
  Filled 2013-02-26: qty 2

## 2013-02-26 MED ORDER — SODIUM CHLORIDE 0.9 % IJ SOLN
10.0000 mL | INTRAMUSCULAR | Status: DC | PRN
  Administered 2013-02-26 – 2013-02-27 (×2): 10 mL

## 2013-02-26 MED ORDER — SODIUM CHLORIDE 0.9 % IJ SOLN: 10.0000 mL | Freq: Two times a day (BID) | INTRAMUSCULAR | Status: DC

## 2013-02-26 MED ORDER — LACOSAMIDE 200 MG PO TABS
100.0000 mg | ORAL_TABLET | ORAL | Status: DC
  Administered 2013-02-26 – 2013-02-27 (×2): 100 mg via ORAL
  Filled 2013-02-26 (×2): qty 2

## 2013-02-26 MED ORDER — ACETAMINOPHEN 325 MG PO TABS
650.0000 mg | ORAL_TABLET | Freq: Four times a day (QID) | ORAL | Status: DC | PRN
  Administered 2013-02-26: 650 mg via ORAL

## 2013-02-26 MED ORDER — CLOBAZAM 10 MG PO TABS: 5.0000 mg | ORAL_TABLET | Freq: Three times a day (TID) | ORAL | Status: DC

## 2013-02-26 MED ORDER — TOPIRAMATE 100 MG PO TABS
100.0000 mg | ORAL_TABLET | ORAL | Status: DC
  Filled 2013-02-26: qty 1

## 2013-02-26 MED ORDER — TOPIRAMATE 100 MG PO TABS
100.0000 mg | ORAL_TABLET | ORAL | Status: DC
  Administered 2013-02-26 – 2013-02-28 (×5): 100 mg via ORAL
  Filled 2013-02-26 (×6): qty 1

## 2013-02-26 MED ORDER — NON FORMULARY: Freq: Three times a day (TID) | Status: DC

## 2013-02-26 MED ORDER — LEVETIRACETAM 750 MG PO TABS
1500.0000 mg | ORAL_TABLET | Freq: Two times a day (BID) | ORAL | Status: DC
  Administered 2013-02-26 – 2013-02-28 (×4): 1500 mg via ORAL
  Filled 2013-02-26 (×6): qty 2

## 2013-02-26 MED ORDER — CARBAMAZEPINE 100 MG PO CHEW
250.0000 mg | CHEWABLE_TABLET | Freq: Once | ORAL | Status: AC
  Administered 2013-02-26: 250 mg via ORAL
  Filled 2013-02-26: qty 2.5

## 2013-02-26 MED ORDER — POTASSIUM CHLORIDE 20 MEQ PO PACK
20.0000 meq | PACK | Freq: Four times a day (QID) | ORAL | Status: DC
  Filled 2013-02-26 (×2): qty 1

## 2013-02-26 MED ORDER — LEVOTHYROXINE SODIUM 50 MCG PO TABS
50.0000 ug | ORAL_TABLET | Freq: Every day | ORAL | Status: DC
  Filled 2013-02-26: qty 1

## 2013-02-26 MED ORDER — WARFARIN SODIUM 5 MG PO TABS
5.0000 mg | ORAL_TABLET | ORAL | Status: DC
  Administered 2013-02-26 – 2013-02-27 (×2): 5 mg via ORAL
  Filled 2013-02-26 (×3): qty 1

## 2013-02-26 MED ORDER — LACOSAMIDE 50 MG PO TABS: ORAL_TABLET | ORAL | Status: DC

## 2013-02-26 MED ORDER — LACOSAMIDE 50 MG PO TABS
50.0000 mg | ORAL_TABLET | Freq: Once | ORAL | Status: AC
  Administered 2013-02-26: 50 mg via ORAL
  Filled 2013-02-26: qty 1

## 2013-02-26 MED ORDER — CARBAMAZEPINE 100 MG PO CHEW
250.0000 mg | CHEWABLE_TABLET | Freq: Three times a day (TID) | ORAL | Status: DC
  Administered 2013-02-26 – 2013-02-28 (×7): 250 mg via ORAL
  Filled 2013-02-26 (×10): qty 2.5

## 2013-02-26 MED ORDER — K PHOS MONO-SOD PHOS DI & MONO 155-852-130 MG PO TABS
500.0000 mg | ORAL_TABLET | Freq: Four times a day (QID) | ORAL | Status: DC
  Administered 2013-02-26 – 2013-02-28 (×7): 500 mg via ORAL
  Filled 2013-02-26 (×9): qty 2

## 2013-02-26 MED ORDER — CLOBAZAM 10 MG PO TABS
5.0000 mg | ORAL_TABLET | ORAL | Status: DC
  Administered 2013-02-26 – 2013-02-28 (×7): 5 mg via ORAL

## 2013-02-26 MED ORDER — WARFARIN SODIUM 7.5 MG PO TABS: 7.5000 mg | ORAL_TABLET | ORAL | Status: DC

## 2013-02-26 MED ORDER — POTASSIUM CHLORIDE CRYS ER 20 MEQ PO TBCR
20.0000 meq | EXTENDED_RELEASE_TABLET | Freq: Four times a day (QID) | ORAL | Status: DC
  Administered 2013-02-26 – 2013-02-28 (×8): 20 meq via ORAL
  Filled 2013-02-26 (×10): qty 1

## 2013-02-26 MED ORDER — SODIUM CHLORIDE 0.9 % IV SOLN
INTRAVENOUS | Status: AC
  Administered 2013-02-26: 11:00:00 via INTRAVENOUS

## 2013-02-26 MED ORDER — CARBAMAZEPINE 100 MG PO CHEW
250.0000 mg | CHEWABLE_TABLET | Freq: Three times a day (TID) | ORAL | Status: DC
  Filled 2013-02-26: qty 2.5

## 2013-02-26 MED ORDER — TOPIRAMATE 25 MG PO TABS
150.0000 mg | ORAL_TABLET | ORAL | Status: DC
  Administered 2013-02-26 – 2013-02-27 (×2): 150 mg via ORAL
  Filled 2013-02-26 (×3): qty 2

## 2013-02-26 MED ORDER — TOPIRAMATE 25 MG PO TABS
100.0000 mg | ORAL_TABLET | Freq: Once | ORAL | Status: AC
  Administered 2013-02-26: 100 mg via ORAL
  Filled 2013-02-26: qty 4

## 2013-02-26 MED ORDER — LEVOTHYROXINE SODIUM 50 MCG PO TABS
50.0000 ug | ORAL_TABLET | Freq: Every day | ORAL | Status: DC
  Administered 2013-02-26 – 2013-02-28 (×3): 50 ug via ORAL
  Filled 2013-02-26 (×4): qty 1

## 2013-02-26 MED ORDER — LACOSAMIDE 50 MG PO TABS
50.0000 mg | ORAL_TABLET | ORAL | Status: DC
  Filled 2013-02-26: qty 1

## 2013-02-26 NOTE — ED Notes (Addendum)
Pt presents to department via [facility] from home for evaluation of seizure. Father states he had seizure this morning at home and was unresponsive with agonal respirations x72min. CPR started by family. Upon arrival patient is alert and able to follow commands. History of mental retardation and growth disorder. Portacath noted to R upper chest. 22g R hand per EMS. CBG 114.

## 2013-02-26 NOTE — ED Notes (Addendum)
Dr. Sharene Skeans at bedside. IV team paged to access portacath. Seizure pads applied.

## 2013-02-26 NOTE — ED Provider Notes (Signed)
History     CSN: [id number]  Arrival date & time 02/26/13  2725   First MD Initiated Contact with Patient 02/26/13 [phone]      Chief Complaint  Patient presents with  . Seizures  . Altered Mental Status    (Consider location/radiation/quality/duration/timing/severity/associated sxs/prior treatment) HPI Patient presents via EMS with report of grand mal seizure and cardiac arrest. Patient has a known history of grand mal seizure with known mental retardation and growth this order. Father reports that he was in his usual state of health last night. He has not had a seizure for 4-6 weeks. He began having a seizure this morning which is basically clonic activity. He then became unresponsive and was not breathing. The mother and the father provided CPR for an unknown period of time until he became responsive again. They report the entire seizure lasting approximately 5 minutes. The patient is currently back to his baseline. He has been receiving all of his medications as prescribed. He is on Coumadin for a previous catheter clot. This catheter has been removed and replaced and he currently has a [name]. He reportedly had his INR recently checked and it was 2.7. He is supposed to be on CPAP at night and there is some suspicion that he has not been using this appropriately. He has had increasing somnolence over an extended period of time and Dr. Sharene Skeans states that there have been multiple attempts to adjust his medication. However when medications are adjusted he begins to have more seizures. As mentioned before the current working diagnosis is that he is possibly not using his CPAP at night as he is supposed to be. The patient is currently awake and at what is reported to be his baseline and has no complaints to me. Past Medical History  Diagnosis Date  . ALLERGIC RHINITIS 12/26/2009  . MITRAL VALVE PROLAPSE 06/22/2007  . SEIZURE DISORDER 06/22/2007  . Unspecified hypothyroidism 06/22/2007  . Growth  disorder   . Mental retardation, mild (I.Q. 50-70)   . Brown's syndrome   . Heart murmur     moderate MR  . Sleep apnea     cpap , last sleep study 10/01/11  . Hypokalemia   . Atrial fibrillation     During port removal and attempted placement in July 2013, transientt  . Hyperlipidemia     No therapy  . Blood clot in vein     Past Surgical History  Procedure Laterality Date  . Implantation vagal nerve stimulator      Left  . Portacath placement      and removal 04/08/12  . Eye surgery      X2 for Brown Syndrome    Family History  Problem Relation Age of Onset  . Breast cancer Mother   . Asthma Father   . Diabetes Father   . Coronary artery disease Maternal Grandfather   . Coronary artery disease Paternal Grandfather     History  Substance Use Topics  . Smoking status: Never Smoker   . Smokeless tobacco: Never Used  . Alcohol Use: No      Review of Systems  Unable to perform ROS   Allergies  Divalproex sodium; Phenytoin; and Vancomycin  Home Medications   Current Outpatient Rx  Name  Route  Sig  Dispense  Refill  . carbamazepine (TEGRETOL) 100 MG chewable tablet      Chew 2+1/2 tablets at 7 AM, 2+1/2 tablets at 1:30 PM and 2+1/2 tablets at 9 PM  233 tablet   5   . diazepam (VALIUM) 2 MG tablet   Oral   Take 1 tablet (2 mg total) by mouth as needed (seizure).   30 tablet   0   . Diazepam 10 MG/2ML DEVI   Intramuscular   Inject 10 mg into the muscle daily as needed. For seizures.         Marland Kitchen guaiFENesin-dextromethorphan (ROBITUSSIN DM) 100-10 MG/5ML syrup   Oral   Take 10 mLs by mouth 3 (three) times daily as needed. For cough.         Marland Kitchen KEPPRA 500 MG tablet      TAKE THREE TABLETS BY MOUTH TWICE DAILY   186 tablet   0     Dispense as written.   . lacosamide (VIMPAT) 50 MG TABS      Patient uses 50 mg in the morning and 50 mg in the evening and 75 mg at 9:30.         . levothyroxine (SYNTHROID, LEVOTHROID) 50 MCG tablet   Oral    Take 50 mcg by mouth every morning.          Marland Kitchen LORazepam (ATIVAN) 2 MG/ML concentrated solution   Oral   Take 2 mg by mouth every 8 (eight) hours as needed. It would be given by IV         . Multiple Vitamin (MULTIVITAMIN WITH MINERALS) TABS   Oral   Take 1 tablet by mouth daily.         . [name] 10 MG TABS   Oral   Take 10 mg by mouth 3 (three) times daily. Take 1/2 of 10mg  tablet 3 times per day   60 tablet   0     Dispense as written.   . phenobarbital (LUMINAL) 60 MG/ML injection   Intravenous   Inject 65 mg into the vein daily as needed. For seizures         . potassium chloride SA (K-DUR,KLOR-CON) 20 MEQ tablet   Oral   Take 20 mEq by mouth 4 (four) times daily.         . potassium phosphate, monobasic, (K-PHOS ORIGINAL) 500 MG tablet   Oral   Take 1 tablet (500 mg total) by mouth 4 (four) times daily. Take at 7a, 130p, 5p, and 9p   124 tablet   5   . topiramate (TOPAMAX) 100 MG tablet   Oral   Take 100-150 mg by mouth 3 (three) times daily. Take 1 tab at 7am, 1 tab at 1:30pm, and 1.5 tabs at  9pm.         . warfarin (COUMADIN) 5 MG tablet   Oral   Take 2.5-5 mg by mouth daily at 6 PM. Take 2.5 mg on tuesdays and saturdays. Take 5 mg on the rest of the week.           BP 98/70  Pulse 106  Temp(Src) 97.7 F (36.5 C) (Oral)  Resp 18  SpO2 96%  Physical Exam  Nursing note and vitals reviewed. Constitutional: He is oriented to person, place, and time.   Patient's systolic blood pressure is 93 and as report by Dr. Sharene Skeans with a weight of 40 kg this is a normal blood pressure for the patient.  HENT:  Right Ear: External ear normal.  Left Ear: External ear normal.  Mouth/Throat: Oropharynx is clear and moist.  Facial features are abnormal. There is some jaw recession no signs of acute trauma are noted.  Eyes: Pupils are equal, round, and reactive to light.  Neck: Normal range of motion. Neck supple.  Cardiovascular: Normal rate, regular rhythm  and normal heart sounds.   Pulmonary/Chest: Effort normal and breath sounds normal.  Abdominal: Soft. Bowel sounds are normal.  Musculoskeletal:  Chronic abnormalities noted of legs and arms no acute changes are noted  Neurological: He is alert and oriented to person, place, and time. He has normal reflexes.  Skin: Skin is warm and dry.    ED Course  Procedures (including critical care time)  Labs Reviewed  CULTURE, BLOOD (ROUTINE X 2)  CULTURE, BLOOD (ROUTINE X 2)  CBC WITH DIFFERENTIAL  COMPREHENSIVE METABOLIC PANEL  LACTIC ACID, PLASMA  CARBAMAZEPINE LEVEL, TOTAL  URINALYSIS, ROUTINE W REFLEX MICROSCOPIC  PROTIME-INR  TROPONIN I   No results found.   No diagnosis found.    Dr. Sharene Skeans is present with the patient. Plan is to obtain metabolic workup showed that there is no infection. He will be observed for least 24 hours if this is normal as he did require CPR.  Date: 02/26/2013  Rate: 57  Rhythm: normal sinus rhythm  QRS Axis: normal  Intervals: normal  ST/T Wave abnormalities: normal  Conduction Disutrbances: none  Narrative Interpretation: unremarkable      Patient care discussed with Drs. Hickling and neurohospitalist. Patient remains stable here but given cpr will be observed.  CRITICAL CARE Performed by: Hilario Quarry Total critical care time: 35 Critical care time was exclusive of separately billable procedures and treating other patients. Critical care was necessary to treat or prevent imminent or life-threatening deterioration. Critical care was time spent personally by me on the following activities: development of treatment plan with patient and/or surrogate as well as nursing, discussions with consultants, evaluation of patient's response to treatment, examination of patient, obtaining history from patient or surrogate, ordering and performing treatments and interventions, ordering and review of laboratory studies, ordering and review of radiographic  studies, pulse oximetry and re-evaluation of patient's condition.     Hilario Quarry, MD 02/26/13 1030

## 2013-02-26 NOTE — ED Notes (Signed)
Called report to Rogers, Charity fundraiser in unit 3W.

## 2013-02-26 NOTE — ED Notes (Signed)
Attempted to call report to unit 3W.

## 2013-02-26 NOTE — Consult Note (Signed)
NEURO HOSPITALIST CONSULT NOTE    Reason for Consult: seizures.  HPI:                                                                                                                                          [name] is an 27 y.o. 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 He had had apneic seizure in the past. He is under the care of Dr. Ellison Carwin at [facility]. Patient's parents stated that overall he has been doing better from a seizure standpoint and denied recent fever, infection, medical illnesses, trauma, or sleep deprivation. They said that he has been complaint with CPAP treatment. Takes carbamazepine, keppra, topamax, and vimpat daily and there is not concern about adherence to treatment. Ativan and phenobarbital as needed. His VNS was just recently adjusted. Parents indicated that GTC as well as partial/sensory seizures are occurring every 6 to 7 weeks, a notable improvement considering that he used to have frequent seizures every 2 weeks.  Past Medical History  Diagnosis Date  . ALLERGIC RHINITIS 12/26/2009  . MITRAL VALVE PROLAPSE 06/22/2007  . SEIZURE DISORDER 06/22/2007  . Unspecified hypothyroidism 06/22/2007  . Growth disorder   . Mental retardation, mild (I.Q. 50-70)   . Brown's syndrome   . Heart murmur     moderate MR  . Sleep apnea     cpap , last sleep study 10/01/11  . Hypokalemia   . Atrial fibrillation     During port removal and attempted placement in July 2013, transientt  . Hyperlipidemia     No therapy  . Blood clot in vein     Past Surgical History  Procedure Laterality  Date  . Implantation vagal nerve stimulator      Left  . Portacath placement      and removal 04/08/12  . Eye surgery      X2 for Brown Syndrome    Family History  Problem Relation Age of Onset  . Breast cancer Mother   . Asthma Father   . Diabetes Father   . Coronary artery disease Maternal Grandfather   . Coronary artery disease Paternal Grandfather     Social History:  reports that he has never smoked. He has never used smokeless tobacco. He reports that he does not drink alcohol or use illicit drugs.  Allergies  Allergen Reactions  . Divalproex Sodium Other (See Comments)    Nausea, vomiting, liver and kidney dysfunction  . Phenytoin  Rash    Hypersensitivity  reaction  . Vancomycin Rash    Redman's Syndrome    MEDICATIONS:                                                                                                                     I have reviewed the patient's current medications.   ROS:                                                                                                                                       History obtained from chart review and family.  General ROS: negative for - chills, fatigue, fever, night sweats, weight gain or weight loss Psychological ROS: negative for - behavioral disorder, hallucinations.  Ophthalmic ROS: negative for - blurry vision, double vision, eye pain or loss of vision ENT ROS: negative for - epistaxis, nasal discharge, oral lesions, sore throat, tinnitus or vertigo Allergy and Immunology ROS: negative for - hives or itchy/watery eyes Hematological and Lymphatic ROS: negative for - bleeding problems, bruising or swollen lymph nodes Endocrine ROS: negative for - galactorrhea, hair pattern changes, polydipsia/polyuria or temperature intolerance Respiratory ROS: negative for - cough, hemoptysis, shortness of breath or wheezing Cardiovascular ROS: negative for - chest pain, edema or irregular heartbeat Gastrointestinal  ROS: negative for - abdominal pain, diarrhea, hematemesis, nausea/vomiting or stool incontinence Genito-Urinary ROS: negative for - dysuria, hematuria, incontinence or urinary frequency/urgency Musculoskeletal ROS: negative for - joint swelling Neurological ROS: as noted in HPI Dermatological ROS: negative for rash and skin lesion changes     Physical exam: pleasant male in no apparent distress. Blood pressure 98/70, pulse 106, temperature 97.7 F (36.5 C), temperature source Oral, resp. rate 18, SpO2 96.00%. Head: normocephalic. Neck: supple, no bruits, no JVD. Cardiac: no murmurs. Lungs: clear. Abdomen: soft, no tender, no mass. Extremities: no edema.    Neurologic Examination:  Mental Status: Alert, awake, oriented. No aphasia.  Able to follow 3 step commands without difficulty. Cranial Nerves: II: Discs flat bilaterally; Visual fields grossly normal, pupils equal, round, reactive to light and accommodation [name],IV, VI: ptosis not present, extra-ocular motions intact bilaterally V,VII: smile symmetric, facial light touch sensation normal bilaterally VIII: hearing normal bilaterally IX,X: gag reflex present XI: bilateral shoulder shrug XII: midline tongue extension Motor: Moves all extremities symmetrically     Sensory: Pinprick and light touch intact throughout, bilaterally Deep Tendon Reflexes: 1+ and symmetric throughout  Plantars: Right: downgoing   Left: downgoing Cerebellar: No tested. Gait: no tested CV: pulses palpable throughout    No results found for this basename: cbc, bmp, coags, chol, tri, ldl, hga1c    Results for orders placed during the hospital encounter of 02/26/13 (from the past 48 hour(s))  POCT I-STAT TROPONIN I     Status: None   Collection Time    02/26/13  9:06 AM      Result Value Range   Troponin i, poc 0.00  0.00 - 0.08 ng/mL   Comment 3            Comment: Due to the release kinetics of cTnI,     a negative result within the first  hours     of the onset of symptoms does not rule out     myocardial infarction with certainty.     If myocardial infarction is still suspected,     repeat the test at appropriate intervals.  CBC WITH DIFFERENTIAL     Status: Abnormal   Collection Time    02/26/13  9:10 AM      Result Value Range   WBC 7.1  4.0 - 10.5 K/uL   RBC 4.46  4.22 - 5.81 MIL/uL   Hemoglobin 13.1  13.0 - 17.0 g/dL   HCT 16.1 (*) 09.6 - 04.5 %   MCV 85.2  78.0 - 100.0 fL   MCH 29.4  26.0 - 34.0 pg   MCHC 34.5  30.0 - 36.0 g/dL   RDW 40.9  81.1 - 91.4 %   Platelets 222  150 - 400 K/uL   Neutrophils Relative % 63  43 - 77 %   Neutro Abs 4.5  1.7 - 7.7 K/uL   Lymphocytes Relative 28  12 - 46 %   Lymphs Abs 2.0  0.7 - 4.0 K/uL   Monocytes Relative 5  3 - 12 %   Monocytes Absolute 0.4  0.1 - 1.0 K/uL   Eosinophils Relative 3  0 - 5 %   Eosinophils Absolute 0.2  0.0 - 0.7 K/uL   Basophils Relative 0  0 - 1 %   Basophils Absolute 0.0  0.0 - 0.1 K/uL  COMPREHENSIVE METABOLIC PANEL     Status: Abnormal   Collection Time    02/26/13  9:10 AM      Result Value Range   Sodium 135  135 - 145 mEq/L   Potassium 3.8  3.5 - 5.1 mEq/L   Chloride 105  96 - 112 mEq/L   CO2 19  19 - 32 mEq/L   Glucose, Bld 114 (*) 70 - 99 mg/dL   BUN 15  6 - 23 mg/dL   Creatinine, Ser 7.82  0.50 - 1.35 mg/dL   Calcium 8.6  8.4 - 95.6 mg/dL   Total Protein 7.2  6.0 - 8.3 g/dL   Albumin 3.3 (*) 3.5 - 5.2 g/dL   AST 31  0 - 37 U/L   ALT 29  0 - 53 U/L   Alkaline Phosphatase 107  39 - 117 U/L   Total Bilirubin 0.3  0.3 - 1.2 mg/dL   GFR calc non Af Amer >90  >90 mL/min   GFR calc Af Amer >90  >90 mL/min   Comment:            The eGFR has been calculated     using the CKD EPI equation.     This calculation has not been  validated in all clinical     situations.     eGFR's persistently     <90 mL/min signify     possible Chronic Kidney Disease.  CARBAMAZEPINE LEVEL, TOTAL     Status: None   Collection Time    02/26/13  9:10  AM      Result Value Range   Carbamazepine Lvl 8.6  4.0 - 12.0 ug/mL  PROTIME-INR     Status: Abnormal   Collection Time    02/26/13  9:10 AM      Result Value Range   Prothrombin Time 24.1 (*) 11.6 - 15.2 seconds   INR 2.28 (*) 0.00 - 1.49  TROPONIN I     Status: None   Collection Time    02/26/13  9:10 AM      Result Value Range   Troponin I <0.30  <0.30 ng/mL   Comment:            Due to the release kinetics of cTnI,     a negative result within the first hours     of the onset of symptoms does not rule out     myocardial infarction with certainty.     If myocardial infarction is still suspected,     repeat the test at appropriate intervals.  POCT I-STAT, CHEM 8     Status: Abnormal   Collection Time    02/26/13  9:10 AM      Result Value Range   Sodium 140  135 - 145 mEq/L   Potassium 3.6  3.5 - 5.1 mEq/L   Chloride 113 (*) 96 - 112 mEq/L   BUN 15  6 - 23 mg/dL   Creatinine, Ser 1.61  0.50 - 1.35 mg/dL   Glucose, Bld 096 (*) 70 - 99 mg/dL   Calcium, Ion 0.45  4.09 - 1.23 mmol/L   TCO2 21  0 - 100 mmol/L   Hemoglobin 12.9 (*) 13.0 - 17.0 g/dL   HCT 81.1 (*) 91.4 - 78.2 %    No results found.   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 He had had apneic seizure in the past. His mental status is now baseline and has no concerning findings on neuro-exam at this moment.  His carbamazepine level is therapeutic and serologies and metabolic pattern are unimpressive. Urinalysis pending. Wonder if current seizure is just part of his usual seizure cycle. I spoke with patient's neurologist Dr. Ellison Carwin who kindly furnished me with patient clinical history and suggested admitting the patient to the hospital for 24 hour observation and I concur with his recommendation. We also  agree with the plan to maintain him on his current anticonvulsant regimen and defer neuroimaging studies at this time.  Wyatt Portela, MD [facility] [phone]  02/26/2013, 10:05 AM

## 2013-02-27 ENCOUNTER — Observation Stay (HOSPITAL_COMMUNITY): Payer: Medicaid Other

## 2013-02-27 LAB — PROTIME-INR
INR: 2.39 — ABNORMAL HIGH (ref 0.00–1.49)
Prothrombin Time: 25 seconds — ABNORMAL HIGH (ref 11.6–15.2)

## 2013-02-27 MED ORDER — LORAZEPAM BOLUS VIA INFUSION
2.0000 mg | Freq: Once | INTRAVENOUS | Status: DC
  Filled 2013-02-27: qty 2

## 2013-02-27 MED ORDER — GUAIFENESIN-DM 100-10 MG/5ML PO SYRP: 5.0000 mL | ORAL_SOLUTION | ORAL | Status: DC | PRN

## 2013-02-27 MED ORDER — HEPARIN SOD (PORK) LOCK FLUSH 100 UNIT/ML IV SOLN
500.0000 [IU] | INTRAVENOUS | Status: DC | PRN
  Administered 2013-02-27: 500 [IU]

## 2013-02-27 MED ORDER — PHENOBARBITAL SODIUM 65 MG/ML IJ SOLN
65.0000 mg | Freq: Once | INTRAMUSCULAR | Status: AC
  Administered 2013-02-27: 65 mg via INTRAVENOUS

## 2013-02-27 MED ORDER — LORAZEPAM 2 MG/ML IJ SOLN
INTRAMUSCULAR | Status: AC
  Administered 2013-02-27: 2 mg
  Filled 2013-02-27: qty 1

## 2013-02-27 NOTE — Progress Notes (Signed)
NEURO HOSPITALIST PROGRESS NOTE   SUBJECTIVE:                                                                                                                        Uneventful night. Offers no new neurological complains. Parents at the bedside stating that there is not concerns at this moment. Jamaree said that he wants to go home.  OBJECTIVE:                                                                                                                           Vital signs in last 24 hours: Temp:  [97.3 F (36.3 C)-98.1 F (36.7 C)] 97.3 F (36.3 C) (06/08 0500) Pulse Rate:  [62-83] 62 (06/08 0500) Resp:  [16-17] 16 (06/08 0500) BP: (88-104)/(50-56) 88/53 mmHg (06/08 0500) SpO2:  [97 %-99 %] 99 % (06/08 0500) Weight:  [49.669 kg (109 lb 8 oz)] 49.669 kg (109 lb 8 oz) (06/07 1306)  Intake/Output from previous day: 06/07 0701 - 06/08 0700 In: 10 [I.V.:10] Out: 100 [Urine:100] Intake/Output this shift: Total I/O In: 340 [P.O.:340] Out: 500 [Urine:500] Nutritional status: General  Past Medical History  Diagnosis Date  . ALLERGIC RHINITIS 12/26/2009  . MITRAL VALVE PROLAPSE 06/22/2007  . SEIZURE DISORDER 06/22/2007  . Unspecified hypothyroidism 06/22/2007  . Growth disorder   . Mental retardation, mild (I.Q. 50-70)   . Brown's syndrome   . Heart murmur     moderate MR  . Sleep apnea     cpap , last sleep study 10/01/11  . Hypokalemia   . Atrial fibrillation     During port removal and attempted placement in July 2013, transientt  . Hyperlipidemia     No therapy  . Blood clot in vein      Neurologic Exam:  Defer neuro-exam.  Lab Results: No results found for this basename: cbc, bmp, coags, chol, tri, ldl, hga1c   Lipid Panel No results found for this basename: CHOL, TRIG, HDL, CHOLHDL, VLDL, LDLCALC,  in the last 72 hours  Studies/Results: Dg Chest Port 1 View  02/26/2013   *RADIOLOGY REPORT*  Clinical Data: Post CPR  PORTABLE  CHEST - 1 VIEW  Comparison: Prior abdominal radiograph including chest x-ray 11/30/2012  Findings: Stable position of right subclavian single lumen portacatheter.  The tip of the catheter projects over the superior cavoatrial junction.  Left anterior chest neural stimulator also in unchanged position with the lead extending into the left C7-T1 level.  The lungs are clear.  No pneumothorax, pleural effusion or focal airspace opacity. Cardiac and mediastinal contours are unchanged. No acute displaced rib fracture identified.  No acute osseous abnormality.  IMPRESSION:  No acute cardiopulmonary disease.   Original Report Authenticated By: Malachy Moan, M.D.    MEDICATIONS                                                                                                                       I have reviewed the patient's current medications.  ASSESSMENT/PLAN:                                                                                                           Lathon is doing quite well at this moment. Will discharge home on his current AED regimen. They already have an appointment to see his child neurologist in few days.  Wyatt Portela ,MD [facility] [phone]  02/27/2013, 12:54 PM

## 2013-02-27 NOTE — Progress Notes (Signed)
Pt transported to [address] via bed with tele monitoring. Family and belongings with patient. Report was called prior to transport. Pt was given 2100 seizure meds prior to transport. Pts [name] medication that family had already picked up from pharmacy was taken back down to pharmacy for dispensing and 4 [facility] nurse made aware. Pt stable at time of transport.

## 2013-02-27 NOTE — Progress Notes (Signed)
Patient had a seizure around 6:30pm while RN was giving discharge instructions to his parents.  The seizure lasted about three minutes.  Dr Leroy Kennedy paged, orders received to give 2mg  ativan IV x 1 and phenobarbitol and then transfer to [facility].  Report given to incoming RN.  Patient is stable at this time.

## 2013-02-27 NOTE — Discharge Summary (Signed)
Physician Discharge Summary   Patient ID: [name] [id number] 27 y.o. September 21, 1986  Admit date: 02/26/2013  Discharge date and time: 02/27/2013, 1:01 PM  Admitting Physician: Wyatt Portela, MD   Discharge Physician: Wyatt Portela, MD  Admission Diagnoses: Seizure  Discharge Diagnoses: seizures.  Admission Condition: {condition:18240  Discharged Condition: stable  Indication for Admission: GTC seizure with apnea requiring CPR at home.  Hospital Course: no complications. No further seizures.  Consults: None  Significant Diagnostic Studies: none required.  Treatments: anti-seizure medications as detailed below.  Discharge Exam:  Neuro-exam: Mental Status:  Alert, awake, oriented. No aphasia. Able to follow 3 step commands without difficulty.  Cranial Nerves:  II: Discs flat bilaterally; Visual fields grossly normal, pupils equal, round, reactive to light and accommodation  [name],IV, VI: ptosis not present, extra-ocular motions intact bilaterally  V,VII: smile symmetric, facial light touch sensation normal bilaterally  VIII: hearing normal bilaterally  IX,X: gag reflex present  XI: bilateral shoulder shrug  XII: midline tongue extension  Motor:  Moves all extremities symmetrically  Sensory: Pinprick and light touch intact throughout, bilaterally  Deep Tendon Reflexes: 1+ and symmetric throughout  Plantars:  Right: downgoing Left: downgoing  Cerebellar:  No tested.  Gait: no tested  CV: pulses palpable throughout  Physical exam: pleasant male in no apparent distress. Blood pressure 98/70, pulse 106, temperature 97.7 F (36.5 C), temperature source Oral, resp. rate 18, SpO2 96.00%.  Head: normocephalic.  Neck: supple, no bruits, no JVD.  Cardiac: no murmurs.  Lungs: clear.  Abdomen: soft, no tender, no mass.  Extremities: no edema    Disposition: 01-Home or Self Care  Patient Instructions:    Medication List    ASK your doctor about these medications        carbamazepine 100 MG chewable tablet  Commonly known as:  TEGRETOL  Chew 250 mg by mouth 3 (three) times daily.     cetirizine 10 MG tablet  Commonly known as:  ZYRTEC  Take 10 mg by mouth daily.     diazepam 2 MG tablet  Commonly known as:  VALIUM  Take 1 tablet (2 mg total) by mouth as needed (seizure).     Diazepam 10 MG/2ML Devi  Inject 10 mg into the muscle daily as needed. For seizures.     guaiFENesin-dextromethorphan 100-10 MG/5ML syrup  Commonly known as:  ROBITUSSIN DM  Take 10 mLs by mouth 3 (three) times daily as needed. For cough.     lacosamide 50 MG Tabs  Commonly known as:  VIMPAT  50 mg in the morning and 50 mg in the afternoon and 75 mg in the evening     levETIRAcetam 500 MG tablet  Commonly known as:  KEPPRA  Take 1,500 mg by mouth every 12 (twelve) hours.     levothyroxine 50 MCG tablet  Commonly known as:  SYNTHROID, LEVOTHROID  Take 50 mcg by mouth every morning.     LORazepam 2 MG/ML concentrated solution  Commonly known as:  ATIVAN  Take 2 mg by mouth every 8 (eight) hours as needed. It would be given by IV     multivitamin with minerals Tabs  Take 1 tablet by mouth daily.     [name] 10 MG tablet  Generic drug:  clobazam  Take 5 mg by mouth 3 (three) times daily.     phenobarbital 60 MG/ML injection  Commonly known as:  LUMINAL  Inject 65 mg into the vein daily as needed. For seizures  potassium chloride SA 20 MEQ tablet  Commonly known as:  K-DUR,KLOR-CON  Take 20 mEq by mouth 4 (four) times daily.     potassium phosphate (monobasic) 500 MG tablet  Commonly known as:  K-PHOS ORIGINAL  Take 500 mg by mouth 4 (four) times daily.     topiramate 100 MG tablet  Commonly known as:  TOPAMAX  Take 100-150 mg by mouth 3 (three) times daily. Take 100mg  in the morning and afternoon. Take 150mg  in the evening     warfarin 5 MG tablet  Commonly known as:  COUMADIN  Take 5 mg by mouth daily.     warfarin 5 MG tablet  Commonly known  as:  COUMADIN  Take 5-7.5 mg by mouth daily. Takes 1 tab (5mg ) daily except for 1.5 tab (7.5mg ) on Tuesday       Activity: activity as tolerated Diet: regular diet Wound Care: none needed  Follow-up with patient's child neurologist in 1 week.  Signed:   Wyatt Portela, MD [facility] 02/27/2013 1:01 PM

## 2013-02-27 NOTE — Progress Notes (Signed)
Called by nursing staff because [name] had a GTC seizure when he was about to be discharge home. He did not stop breathing this time and at this moment is alert and awake and answering questions appropriately. Parents said that he is having back to back sensory seizures and this will usually lead to another convulsion. They asked me to use the seizure protocol that in cases like this is used by his child neurologist and I concur in doing so. Parents are also concerned that he could had aspirated during this seizure and I ordered a chest X ray. Transfer to [address] [place].  [name],MD

## 2013-02-27 NOTE — Significant Event (Signed)
Rapid Response Event Note  Overview: patient having seizure Time Called: 1823 Arrival Time: 1823 Event Type: Neurologic  Initial Focused Assessment:  Upon arrival to patients room, Rn and family at bedside.  As per parents, he had a grand mal seizure lasting 3 minutes.  Upon my arrival patient was out of the seizure and was starting to communicate with [place].  Patient is lethargic.  Nasal cannula at 4 lpm on Sats 100%.  Spoke with Dr. Leroy Kennedy and now at bedside.  Patient states he is having sensory seizures now and has had 2 sensory seizures.     Interventions:  Patient to be transferred to 4N Rn to call if assistance needed   Event Summary:   at      at          [facility]

## 2013-02-28 MED ORDER — HEPARIN SOD (PORK) LOCK FLUSH 100 UNIT/ML IV SOLN
500.0000 [IU] | INTRAVENOUS | Status: AC | PRN
  Administered 2013-02-28: 500 [IU]

## 2013-02-28 NOTE — Progress Notes (Signed)
Discharge paperwork and education complete.  IV team de-accessed port-a cath.  Patients home meds given to mother.  NT taking patient downstairs for discharge.   Lance Bosch, RN

## 2013-02-28 NOTE — Progress Notes (Signed)
NEURO HOSPITALIST PROGRESS NOTE   SUBJECTIVE:                                                                                                                        No further seizures reported. [name] has no neurological complains at this moment. Denies having pain resulting from his seizure last evening. Chest X ray showed no evidence of infection. He wants to go home and parents also want to pursue that pathway.  OBJECTIVE:                                                                                                                           Vital signs in last 24 hours: Temp:  [97.5 F (36.4 C)-98.5 F (36.9 C)] 97.9 F (36.6 C) (06/09 1337) Pulse Rate:  [76-95] 76 (06/09 0600) Resp:  [18-22] 19 (06/09 1337) BP: (86-134)/(46-92) 86/46 mmHg (06/09 1337) SpO2:  [98 %-100 %] 100 % (06/09 1337)  Intake/Output from previous day: 06/08 0701 - 06/09 0700 In: 340 [P.O.:340] Out: 900 [Urine:900] Intake/Output this shift: Total I/O In: 480 [P.O.:480] Out: -  Nutritional status: General  Past Medical History  Diagnosis Date  . ALLERGIC RHINITIS 12/26/2009  . MITRAL VALVE PROLAPSE 06/22/2007  . SEIZURE DISORDER 06/22/2007  . Unspecified hypothyroidism 06/22/2007  . Growth disorder   . Mental retardation, mild (I.Q. 50-70)   . Brown's syndrome   . Heart murmur     moderate MR  . Sleep apnea     cpap , last sleep study 10/01/11  . Hypokalemia   . Atrial fibrillation     During port removal and attempted placement in July 2013, transientt  . Hyperlipidemia     No therapy  . Blood clot in vein     Physical exam: pleasant male in no apparent distress. Blood pressure 98/70, pulse 106, temperature 97.7 F (36.5 C), temperature source Oral, resp. rate 18, SpO2 96.00%.  Head: normocephalic.  Neck: supple, no bruits, no JVD.  Cardiac: no murmurs.  Lungs: clear.  Abdomen: soft, no tender, no mass.  Extremities: no edema.   Mental Status:   Alert, awake, oriented. No aphasia. Able to follow 3 step commands without difficulty.  Cranial Nerves:  II: Discs flat bilaterally;  Visual fields grossly normal, pupils equal, round, reactive to light and accommodation  [name],IV, VI: ptosis not present, extra-ocular motions intact bilaterally  V,VII: smile symmetric, facial light touch sensation normal bilaterally  VIII: hearing normal bilaterally  IX,X: gag reflex present  XI: bilateral shoulder shrug  XII: midline tongue extension  Motor:  Moves all extremities symmetrically  Sensory: Pinprick and light touch intact throughout, bilaterally  Deep Tendon Reflexes: 1+ and symmetric throughout  Plantars:  Right: downgoing Left: downgoing  Cerebellar:  No tested.  Gait: no tested  CV: pulses palpable throughout   Neurologic Exam:  Mental Status:  Alert, awake, oriented. No aphasia. Able to follow 3 step commands without difficulty.  Cranial Nerves:  II: Discs flat bilaterally; Visual fields grossly normal, pupils equal, round, reactive to light and accommodation  [name],IV, VI: ptosis not present, extra-ocular motions intact bilaterally  V,VII: smile symmetric, facial light touch sensation normal bilaterally  VIII: hearing normal bilaterally  IX,X: gag reflex present  XI: bilateral shoulder shrug  XII: midline tongue extension  Motor:  Moves all extremities symmetrically  Sensory: Pinprick and light touch intact throughout, bilaterally  Deep Tendon Reflexes: 1+ and symmetric throughout  Plantars:  Right: downgoing Left: downgoing  Cerebellar:  No tested.  Gait: no tested  No meningeal irritation signs.   Lab Results: No results found for this basename: cbc, bmp, coags, chol, tri, ldl, hga1c   Lipid Panel No results found for this basename: CHOL, TRIG, HDL, CHOLHDL, VLDL, LDLCALC,  in the last 72 hours  Studies/Results: Dg Chest Port 1 View  02/27/2013   *RADIOLOGY REPORT*  Clinical Data: Seizure, possible aspiration  pneumonia.  PORTABLE CHEST - 1 VIEW  Comparison: 02/26/2013  Findings: Right subclavian [name] unchanged. Left chest neural stimulator device unchanged.  Lungs are adequately inflated without focal consolidation or effusion.  The cardiomediastinal silhouette and remainder of the exam is unchanged.  IMPRESSION: No acute cardiopulmonary disease.   Original Report Authenticated By: Elberta Fortis, M.D.    MEDICATIONS                                                                                                                       I have reviewed the patient's current medications.  ASSESSMENT/PLAN:                                                                                                            Doing well and wants to go home. Discharge home on same anti-convulsants and follow up with Dr. Sharene Skeans first available.  Georga Hacking  Leroy Kennedy, MD [facility] [phone]  02/28/2013, 3:01 PM

## 2013-02-28 NOTE — Discharge Summary (Signed)
Admit date: 02/26/2013  Discharge date and time: 02/28/2013, 3:07 PM  Admitting Physician: Wyatt Portela, MD  Discharge Physician: Wyatt Portela, MD  Admission Diagnoses: Seizure  Discharge Diagnoses: seizures.  Admission Condition: stable  Discharged Condition: stable Indication for Admission: GTC seizure with apnea requiring CPR at home.  Hospital Course: patient sustained a GTC seizure when he was ready to be discharge home on 02/27/13. Did recover from that seizure after receiving IV ativan and Phenobarbital IV. No further seizures. Chest x ray revealed no infection. Doing well at this moment and patient and parents want to go home.  Consults: None  Significant Diagnostic Studies: chest x ray  Treatments: anti-seizure medications as detailed below.  Discharge Exam:  Neuro-exam:  Mental Status:  Alert, awake, oriented. No aphasia. Able to follow 3 step commands without difficulty.  Cranial Nerves:  II: Discs flat bilaterally; Visual fields grossly normal, pupils equal, round, reactive to light and accommodation  [name],IV, VI: ptosis not present, extra-ocular motions intact bilaterally  V,VII: smile symmetric, facial light touch sensation normal bilaterally  VIII: hearing normal bilaterally  IX,X: gag reflex present  XI: bilateral shoulder shrug  XII: midline tongue extension  Motor:  Moves all extremities symmetrically  Sensory: Pinprick and light touch intact throughout, bilaterally  Deep Tendon Reflexes: 1+ and symmetric throughout  Plantars:  Right: downgoing Left: downgoing  Cerebellar:  No tested.  Gait: no tested  CV: pulses palpable throughout  Physical exam: pleasant male in no apparent distress. Blood pressure 98/70, pulse 106, temperature 97.7 F (36.5 C), temperature source Oral, resp. rate 18, SpO2 96.00%.  Head: normocephalic.  Neck: supple, no bruits, no JVD.  Cardiac: no murmurs.  Lungs: clear.  Abdomen: soft, no tender, no mass.  Extremities: no edema   Disposition: 01-Home or Self Care  Patient Instructions:    Medication List     ASK your doctor about these medications       carbamazepine 100 MG chewable tablet    Commonly known as: TEGRETOL    Chew 250 mg by mouth 3 (three) times daily.    cetirizine 10 MG tablet    Commonly known as: ZYRTEC    Take 10 mg by mouth daily.    diazepam 2 MG tablet    Commonly known as: VALIUM    Take 1 tablet (2 mg total) by mouth as needed (seizure).    Diazepam 10 MG/2ML Devi    Inject 10 mg into the muscle daily as needed. For seizures.    guaiFENesin-dextromethorphan 100-10 MG/5ML syrup    Commonly known as: ROBITUSSIN DM    Take 10 mLs by mouth 3 (three) times daily as needed. For cough.    lacosamide 50 MG Tabs    Commonly known as: VIMPAT    50 mg in the morning and 50 mg in the afternoon and 75 mg in the evening    levETIRAcetam 500 MG tablet    Commonly known as: KEPPRA    Take 1,500 mg by mouth every 12 (twelve) hours.    levothyroxine 50 MCG tablet    Commonly known as: SYNTHROID, LEVOTHROID    Take 50 mcg by mouth every morning.    LORazepam 2 MG/ML concentrated solution    Commonly known as: ATIVAN    Take 2 mg by mouth every 8 (eight) hours as needed. It would be given by IV    multivitamin with minerals Tabs    Take 1 tablet by mouth daily.    [name] 10  MG tablet    Generic drug: clobazam    Take 5 mg by mouth 3 (three) times daily.    phenobarbital 60 MG/ML injection    Commonly known as: LUMINAL    Inject 65 mg into the vein daily as needed. For seizures    potassium chloride SA 20 MEQ tablet    Commonly known as: K-DUR,KLOR-CON    Take 20 mEq by mouth 4 (four) times daily.    potassium phosphate (monobasic) 500 MG tablet    Commonly known as: K-PHOS ORIGINAL    Take 500 mg by mouth 4 (four) times daily.    topiramate 100 MG tablet    Commonly known as: TOPAMAX    Take 100-150 mg by mouth 3 (three) times daily. Take 100mg  in the morning and afternoon. Take 150mg  in  the evening    warfarin 5 MG tablet    Commonly known as: COUMADIN    Take 5 mg by mouth daily.    warfarin 5 MG tablet    Commonly known as: COUMADIN    Take 5-7.5 mg by mouth daily. Takes 1 tab (5mg ) daily except for 1.5 tab (7.5mg ) on Tuesday      Activity: activity as tolerated  Diet: regular diet  Wound Care: none needed  Follow-up with patient's child neurologist in 1 week.  Signed:  Wyatt Portela, MD  [facility]  02/28/2013  3:07 PM

## 2013-03-01 ENCOUNTER — Telehealth: Payer: Self-pay | Admitting: Family

## 2013-03-01 NOTE — Telephone Encounter (Signed)
I spoke with mother for 3 minutes.  I recommended increasing Vimpat back to 50, 50, and 100 because we had a long run of seizure freedom.  She has only accessed the port.  If he has further seizures she either give a dose of Ativan or dose of Ativan and phenobarbital.  He has had apnea in association with his generalized seizures in the last several seizures.

## 2013-03-01 NOTE — Telephone Encounter (Signed)
Mom, [name] left a message saying that [name] was admitted to hospital Saturday due to seizure that required CPR at home. Shortly after d/c Sun, he had another one that required another trip to ER and treatment with Phenobarb and Ativan. On Monday he had another seizure and Mom gave Ativan at 7pm last night. If he has another seizure today, Mom wants to know does she give Ativan and Phenobarbital today? Please call Mom at [phone]. TG

## 2013-03-04 LAB — CULTURE, BLOOD (ROUTINE X 2): Culture: NO GROWTH

## 2013-03-11 ENCOUNTER — Other Ambulatory Visit: Payer: Self-pay | Admitting: Family

## 2013-03-11 DIAGNOSIS — G40319 Generalized idiopathic epilepsy and epileptic syndromes, intractable, without status epilepticus: Secondary | ICD-10-CM

## 2013-03-11 DIAGNOSIS — G40219 Localization-related (focal) (partial) symptomatic epilepsy and epileptic syndromes with complex partial seizures, intractable, without status epilepticus: Secondary | ICD-10-CM

## 2013-03-14 ENCOUNTER — Telehealth: Payer: Self-pay

## 2013-03-14 DIAGNOSIS — G8110 Spastic hemiplegia affecting unspecified side: Secondary | ICD-10-CM

## 2013-03-14 DIAGNOSIS — I82722 Chronic embolism and thrombosis of deep veins of left upper extremity: Secondary | ICD-10-CM

## 2013-03-14 NOTE — Telephone Encounter (Signed)
DJ lvm stating that she spoke w Alcario Drought and was told that Dr. Rexene Edison was out of the office for a few days this week. She said that she has concerns and wants to know how soon she can get Josehua in to see Dr.H once he is back in the office. I called mom and she said that she has not given  Eliyohu his medication yet this morning. She said that he is a lot more alert. She said that as she gives him his medications throughout the day he becomes more and more lethargic and sleeps alot. Mom said that her second concern is that he has not had a doppler or an MRI done in a long time on his left shoulder where the blood clot is. Her third concern, is that Meir is scheduled for foot surgery in mid July and they have to take him off his blood thinners. DJ would like to discuss this with Inetta Fermo and asked that she call her on her cell phone at [phone].

## 2013-03-14 NOTE — Telephone Encounter (Signed)
I called and talked with Mom. She said that [name] is not acting like himself. He is staring at times, but not like a staring seizure, seems to be in a fog. He has lost interest in things that he normally likes to do, just sits and stares, and is sleepy. Mom said that he is sleeping at night.  Mom wants to reduce the Vimpat or Topamax dose because he has gotten worse as the Vimpat dose as increased but says that Dr Rexene Edison told her that he might decrease the Topamax dose since Vimpat has brought about reduction in seizures. She said that he has not had seizures since his hospitalization on 02/27/13. He is currently taking Topamax 100mg  AM, 100mg  afternoon and 150mg  PM. I told Mom to decrease Topamax dose to 100mg  TID for now.   Mom also wants to know if Dr Rexene Edison will order repeat Doppler study to recheck blood clot left neck as his face and neck is edematous as it was prior to diagnosis of blood clot.  I told her that I would ask him about tomorrow when he is back in office. Mom also wants MRI of brain because [name] is not acting like himself, he is not using left arm and hand - seems to act like it is not there.  Mom is very concerned about all these things with [name] because he has to go off Coumadin for his upcoming foot surgery on July 21st and she wants these things checked before he does so. I told Mom that I would talk with Dr Sharene Skeans about these things tomorrow since he is not in office today and would call her tomorrow. She agreed with this plan. TG

## 2013-03-15 NOTE — Telephone Encounter (Signed)
DJ lvm asking if [name] had a chance to speak with Dr.H yet. She said that [name] was c/o headache last night and she gave him some Tylenol. Please call DJ at [phone].

## 2013-03-15 NOTE — Telephone Encounter (Signed)
Let's put him on Trokendi XR 200 mg +2 50 mg tablets.  Discontinue the Topamax.  I have ordered the other studies.  Let me know if there was any error in ordering.  I spoke with mom for about 5 minutes.

## 2013-03-15 NOTE — Telephone Encounter (Signed)
The MRI was approved by Medicaid and is scheduled for June 30 @ 0900 @ [facility], arrival time is 0830AM at Radiology. The doppler study of the upper extremities is under review by Medicaid and is scheduled for June 25 @ 11 AM at [facility], arrival time 1030AM at Admitting at Entrance A. The vascular lab will call report tomorrow. I called these appointments to Mom. She said that Dad would pick up the Trokendi samples today. I did correct the upper extremity doppler orders as recommended by the vascular lab. TG

## 2013-03-16 ENCOUNTER — Telehealth: Payer: Self-pay | Admitting: Family

## 2013-03-16 ENCOUNTER — Ambulatory Visit (HOSPITAL_COMMUNITY)
Admission: RE | Admit: 2013-03-16 | Discharge: 2013-03-16 | Disposition: A | Discharge status: Home / Self Care | Payer: Medicaid Other | Source: Ambulatory Visit | Attending: Pediatrics | Admitting: Pediatrics

## 2013-03-16 DIAGNOSIS — I82C13 Acute embolism and thrombosis of internal jugular vein, bilateral: Secondary | ICD-10-CM

## 2013-03-16 DIAGNOSIS — Z7901 Long term (current) use of anticoagulants: Secondary | ICD-10-CM | POA: Insufficient documentation

## 2013-03-16 DIAGNOSIS — M7989 Other specified soft tissue disorders: Secondary | ICD-10-CM

## 2013-03-16 DIAGNOSIS — I82722 Chronic embolism and thrombosis of deep veins of left upper extremity: Secondary | ICD-10-CM

## 2013-03-16 DIAGNOSIS — R220 Localized swelling, mass and lump, head: Secondary | ICD-10-CM | POA: Insufficient documentation

## 2013-03-16 DIAGNOSIS — Z86718 Personal history of other venous thrombosis and embolism: Secondary | ICD-10-CM

## 2013-03-16 NOTE — Telephone Encounter (Signed)
I spoke with Dr. Humphrey Rolls of radiology and he reassured me that the clot has probably been present for a long time and is not new.  He doesn't think that further workup is going to be necessary in further he thinks the Coumadin can be safely stopped.  Trokendi just got started.Is too soon to tell if this is going to change his altered mental status and his malaise.

## 2013-03-16 NOTE — Telephone Encounter (Signed)
[name] from Vascular Lab called to report that venous ultraound of upper extremity today shows thrombus left jugular. Comparison study on right also shows thrombus in right jugular. Dictated report to follow. TG

## 2013-03-16 NOTE — Progress Notes (Signed)
*  PRELIMINARY RESULTS* Vascular Ultrasound Left upper extremity venous duplex has been completed.  Preliminary findings: DVT involving the left and right internal jugular veins.  Called report to Elveria Rising, NP.   Farrel Demark, RDMS, RVT  03/16/2013, 11:07 AM

## 2013-03-21 ENCOUNTER — Ambulatory Visit (HOSPITAL_COMMUNITY)
Admission: RE | Admit: 2013-03-21 | Discharge: 2013-03-21 | Disposition: A | Discharge status: Home / Self Care | Payer: Medicaid Other | Source: Ambulatory Visit | Attending: Pediatrics | Admitting: Pediatrics

## 2013-03-21 DIAGNOSIS — J329 Chronic sinusitis, unspecified: Secondary | ICD-10-CM | POA: Insufficient documentation

## 2013-03-21 DIAGNOSIS — G8110 Spastic hemiplegia affecting unspecified side: Secondary | ICD-10-CM | POA: Insufficient documentation

## 2013-03-22 ENCOUNTER — Telehealth: Payer: Self-pay

## 2013-03-22 NOTE — Telephone Encounter (Signed)
I called DJ and let her know that Dr Sharene Skeans would review the doppler and MRI brain report with them at the appointment tomorrow. TG

## 2013-03-22 NOTE — Telephone Encounter (Signed)
DJ lvm stating that they would like the results to the Doppler and the MRI when they come in for the appt tomorrow morning.

## 2013-03-23 ENCOUNTER — Ambulatory Visit (INDEPENDENT_AMBULATORY_CARE_PROVIDER_SITE_OTHER): Payer: Medicaid Other | Admitting: Pediatrics

## 2013-03-23 ENCOUNTER — Encounter: Payer: Self-pay | Admitting: Pediatrics

## 2013-03-23 ENCOUNTER — Ambulatory Visit (INDEPENDENT_AMBULATORY_CARE_PROVIDER_SITE_OTHER): Payer: Medicaid Other | Admitting: *Deleted

## 2013-03-23 VITALS — BP 110/70 | HR 72 | Temp 98.0°F | Resp 20 | Ht 62.5 in | Wt 109.2 lb

## 2013-03-23 DIAGNOSIS — I82723 Chronic embolism and thrombosis of deep veins of upper extremity, bilateral: Secondary | ICD-10-CM

## 2013-03-23 DIAGNOSIS — R51 Headache: Secondary | ICD-10-CM

## 2013-03-23 DIAGNOSIS — G40119 Localization-related (focal) (partial) symptomatic epilepsy and epileptic syndromes with simple partial seizures, intractable, without status epilepticus: Secondary | ICD-10-CM

## 2013-03-23 DIAGNOSIS — G40219 Localization-related (focal) (partial) symptomatic epilepsy and epileptic syndromes with complex partial seizures, intractable, without status epilepticus: Secondary | ICD-10-CM

## 2013-03-23 DIAGNOSIS — I82729 Chronic embolism and thrombosis of deep veins of unspecified upper extremity: Secondary | ICD-10-CM

## 2013-03-23 DIAGNOSIS — R400 Somnolence: Secondary | ICD-10-CM

## 2013-03-23 DIAGNOSIS — M25579 Pain in unspecified ankle and joints of unspecified foot: Secondary | ICD-10-CM

## 2013-03-23 DIAGNOSIS — M25572 Pain in left ankle and joints of left foot: Secondary | ICD-10-CM

## 2013-03-23 DIAGNOSIS — G4710 Hypersomnia, unspecified: Secondary | ICD-10-CM

## 2013-03-23 DIAGNOSIS — Z7901 Long term (current) use of anticoagulants: Secondary | ICD-10-CM

## 2013-03-23 DIAGNOSIS — G40319 Generalized idiopathic epilepsy and epileptic syndromes, intractable, without status epilepticus: Secondary | ICD-10-CM

## 2013-03-23 DIAGNOSIS — I82629 Acute embolism and thrombosis of deep veins of unspecified upper extremity: Secondary | ICD-10-CM

## 2013-03-23 MED ORDER — TROKENDI XR 200 MG PO CP24: 200.0000 mg | ORAL_CAPSULE | Freq: Every day | ORAL | Status: DC

## 2013-03-23 MED ORDER — TROKENDI XR 50 MG PO CP24: 100.0000 mg | ORAL_CAPSULE | Freq: Every day | ORAL | Status: DC

## 2013-03-23 NOTE — Telephone Encounter (Signed)
done

## 2013-03-23 NOTE — Progress Notes (Signed)
Subjective:    Patient ID: [name] is a 27 y.o. male.  Seizures   He is here today because the research study RESC[facility] is closing out and he needed a final evaluation.  He has benefited from the subcutaneous injection of Valium, which more often than not would abort his simple partial seizures and prevent a generalized tonic-clonic seizure. Of late he has become somewhat refractory to that in that it does not always work.   The auto-injector was last used on or about Feb 07, 2013. He has not experienced any side affects attributable to the injection. Problems that he has are all chronic and some are not stable.   He has an awkward gait because of a tight left heel cord that is now in a boot. He limps when he walks and externally rotates the left leg. I think that this is more from pain than weakness. I was not able to find any focal or lateralize findings today.  Review of Systems  Neurological: Positive for seizures.    Objective:  Neurologic Exam  Mental Status: Awake with mild lethargy. Oriented to place and time. Attention span, concentration, and fund of knowledge appropriate for him. He has evidence of mild cognitive impairment. Mood and affect appropriate. He is tired but interactive with the examiner and his mother. 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 Pupils equal, briskly reactive to light. Extraocular movements full but dysconjugate. Visual fields full to confrontation. Hearing intact and symmetric to whisper. Facial sensation intact. Face, tongue, palate move normally and symmetrically. Neck flexion and extension normal. 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  Motor: Diminished bulk and normal tone. The patient has mild weakness in his deltoids and biceps in the 4+ over 5 range.He had normal strength in his legs. He has clumsy fine motor movements.   Sensory: Intact to light touch,cold, vibration, and proprioception in all extremities; good stereognosis  Coordination: Rapid alternating movements are clumsy in all extremities. Finger-to-nose and heel-to-shin performed accurately bilaterally. Romberg negative.  Gait and Station: Arises from the exam table without difficulty. Stance is wide based. Gait demonstrates ataxic stride length and fair balance. Able to heel, toe, and tandem walk with difficulty. He is toe walking on the left, and has tight heel cords bilaterally.  Reflexes: Absent and symmetric. Toes downgoing.  Physical Exam BP 110/70  Pulse 72  Temp(Src) 98 F (36.7 C)  Resp 20  Ht 5' 2.5" (1.588 m)  Wt 109 lb 3.2 oz (49.533 kg)  BMI 19.64 kg/m2  General: thin young man, seated on exam table, in mild evident distress, right-handed, brown hair, brown eyes; Occasionally he holds his head and sighs  Head: head microcephalic and atraumatic, His head turned with the right ear toward his right shoulder and chin points the left. He does not have torticollis  Ears, Nose and Throat: beak-like nose. He has a low lying palate.  Neck: supple with no carotid or supraclavicular bruits. He complains of soreness with palpation of the cervical lymph nodes  Respiratory: Lungs are clear to auscultation  Cardiovascular: regular rate and rhythm, soft holosystolic murmur at left sternal border. His nailbeds are pink.  Musculoskeletal: short stature with head tilted to the right. He has tight heel cords.  Skin: no rashes or lesions.  Trunk: no scoliosis. His portacath in the right upper chest. He has a vagal nerve stimulator in the  left upper chest.  Neurologic Exam  Assessment:   [name] here for his end of study visit in the RESCUE study. All vitals was collected. C-SSRS was perfrom my study coordinator [employer]. Unused auto-injector # [id number] was returned. Ellison Carwin, MD in to do Neurologic and Physical exam.   Plan:    1. 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 2. 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 3. 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 Daytime somnolence, 780.54. 5. Left ankle pain, 719.47. 6. Headaches unknown etiology, 784.0.  He will continue to take his medications and use his vagal nerve stimulator.  Abortive medications will include rectal diazepam, and intravenous Ativan and phenobarbital.

## 2013-03-23 NOTE — Patient Instructions (Signed)
Let me know how well he is doing, we will continue to try to make changes if possible.

## 2013-03-23 NOTE — Progress Notes (Addendum)
Patient: [name] MRN: [id number] Sex: male DOB: 13-Mar-1986  Provider: Deetta Perla, MD Location of Care: [facility]  Note type: Routine return visit  History of Present Illness: Referral Source: [facility] History from: both parents, patient and [facility] chart Chief Complaint: Seizures/Research Study Exit Exam  [name] is a 27 y.o. male who returns for evaluation and management of intractable seizures.  He also returns for close out of his RESCUE study.  The patient was seen on March 23, 2013, for the first time since February 24, 2013.  In the past month, he has continued to have loss of interest in things that used to excite him, excessive sleepiness, lack of socialization with peers, and his family.  The seizures have lessened.  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    He takes four antiepileptic drugs plus vagal nerve stimulator to treat his seizures.  He is admitted after a prolonged generalized tonic-clonic seizure with apnea requiring CPR and remained in the hospital from February 26, 2013, through the February 28, 2013.  He was treated with IV Ativan and phenobarbital, which stopped his seizures, but was hospitalized for observation.  No change was made in his medications.  Over the past month through a series of telephone calls, we changed his topiramate to Trokendi.  He seems somewhat less sleepy according to his parents.  They note that the swelling he had in his neck when he developed venous thrombus, caused by occlusion of his superior vena cava has come back to some degree.  He had a venous Doppler done, which showed clot within the jugular veins bilaterally.  I had this evaluated by the vascular radiologist to assist placing the port in his superior circulation and he said that  this is not a new finding and that the patient had significant obstruction of his superior venous return, most of which made its way to the heart through of writer collaterals deep within his mediastinum.  He doubts that Coumadin is doing much and says that if one collateral clots off, there will be a time when he has edema until other collaterals open up.  The patient has had daily headaches.  These may be in part related to diminished venous return.  I think it may be making him feel badly.  There are numerous times today where he leaned forward and rubbed his head and said that his head hurt.  Because he is on Coumadin, were limited in the medications that can be used to treat his headaches.  His parents also observed that he was using his left arm less than he had been.  MRI scan of the brain shows no significant change.  There are areas of mild increased signal in the FLAIR scan that are not seen on T2.  These are diffuse, discrete, and involve the white matter.  They are nonspecific.  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  There is no evidence of ischemic change, recent or remote on MRI scan and basically it is unchanged from previous studies.  He is here today because the research study RESCUE is closing out and he needed a final evaluation.  He has benefited from the subcutaneous injection of Valium, which more often than not would abort his simple partial seizures and prevent a generalized tonic-clonic  seizure.  Of late he has become somewhat refractory to that in that it does not always work.  The auto-injector was last used on or about Feb 07, 2013.  He has not experienced any side affects attributable to the injection.  Problems that he has are all chronic and some are not stable.  He has an awkward gait because of a tight left heel cord that is now in a boot.  He limps when he walks and externally rotates the left leg.  I think that this  is more from pain than weakness.  I was not able to find any focal or lateralize findings today.  Review of Systems: 12 system review was remarkable for cough, allergies, runny nose, headache, seizure, depression, anxiety, too much sleep and disinterest in past activities.  Past Medical History  Diagnosis Date  . ALLERGIC RHINITIS 12/26/2009  . MITRAL VALVE PROLAPSE 06/22/2007  . SEIZURE DISORDER 06/22/2007  . Unspecified hypothyroidism 06/22/2007  . Growth disorder   . Mental retardation, mild (I.Q. 50-70)   . Brown's syndrome   . Heart murmur     moderate MR  . Sleep apnea     cpap , last sleep study 10/01/11  . Hypokalemia   . Atrial fibrillation     During port removal and attempted placement in July 2013, transientt  . Hyperlipidemia     No therapy  . Blood clot in vein    Hospitalizations: yes, Head Injury: no, Nervous System Infections: no, Immunizations up to date: yes Past Medical History Comments: Diagnosis of developmental delay as a toddler. He was a breech presentation, cesarean section delivery. He had intrauterine growth retardation, and failure to thrive. EEG 11/07/86 was normal for gestational age. Genetics evaluation short stature, prenatal onset, chromosomal study: 38 XY, fragile X syndrome negative, evaluation for MELAS and MERRF were negative.  Initial seizure 08/28/94 left locomotor with secondary generalization  MRI of the brain 09/13/93: Scattered subcortical white matter lesions at the parietotemporal parieto-occipital junction is ischemic versus hamartomas.  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 Tegretol 30 mcg for militate. 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  Topiramate started in May 1999 unable to be tolerated with Tegretol and was tapered and discontinued  January 2000 EEG showed right greater than left mid temporal sharp waves was otherwise well-organized EEG. He hospitalizations in March, July, October, in November for recurrent seizures.  Patient placed on Depakote in April 2000 and developed gagging abdominal pain headache and had significant change in transaminases and liver functions. Topamax is restarted and Keppra was added.  Behavior History withdrawn and depressed.  Surgical History Past Surgical History  Procedure Laterality Date  . Implantation vagal nerve stimulator      Left  . Portacath placement      and removal 04/08/12  . Eye surgery  X2 for [employer] Syndrome   Family History family history includes Asthma in his father; Breast cancer in his mother; Coronary artery disease in his maternal grandfather and paternal grandfather; and Diabetes in his father. Family History is negative migraines, seizures, cognitive impairment, blindness, deafness, birth defects, chromosomal disorder, autism.  Social History History   Social History  . Marital Status: Single    Spouse Name: N/A    Number of Children: 0  . Years of Education: N/A   Occupational History  . Disabled    Social History Main Topics  . Smoking status: Never Smoker   . Smokeless tobacco: Never Used  . Alcohol Use: No  . Drug Use: No  . Sexually Active: No   Other Topics Concern  . None   Social  History Narrative  . None   Graduate from Date we [employer]. Living with both parents  Hobbies/Interest: Watching sporting events  Current Outpatient Prescriptions on File Prior to Visit  Medication Sig Dispense Refill  . carbamazepine (TEGRETOL) 100 MG chewable tablet Chew 250 mg by mouth 3 (three) times daily.      . cetirizine (ZYRTEC) 10 MG tablet Take 10 mg by mouth daily.      . clobazam ([name]) 10 MG tablet Take 5 mg by mouth 3 (three) times daily.      . diazepam (VALIUM) 2 MG tablet Take 1 tablet (2 mg total) by mouth as needed (seizure).  30 tablet  0  . guaiFENesin-dextromethorphan (ROBITUSSIN DM) 100-10 MG/5ML syrup Take 10 mLs by mouth 3 (three) times daily as needed. For cough.      Marland Kitchen KEPPRA 500 MG tablet TAKE THREE TABLETS BY MOUTH TWICE DAILY  186 tablet  5  . lacosamide (VIMPAT) 50 MG TABS 50 mg. 50 mg in the morning and 50 mg in the afternoon and 100 mg in the evening      . levothyroxine (SYNTHROID, LEVOTHROID) 50 MCG tablet Take 50 mcg by mouth every morning.       Marland Kitchen LORazepam (ATIVAN) 2 MG/ML concentrated solution Inject 2 mg into the vein every 8 (eight) hours as needed. It would be given by IV      . Multiple Vitamin (MULTIVITAMIN WITH MINERALS) TABS Take 1 tablet by mouth daily.      . phenobarbital (LUMINAL) 60 MG/ML injection Inject 65 mg into the vein daily as needed. For seizures      . potassium chloride SA (K-DUR,KLOR-CON) 20 MEQ tablet Take 20 mEq by mouth 4 (four) times daily.      . potassium phosphate, monobasic, (K-PHOS ORIGINAL) 500 MG tablet Take 500 mg by mouth 4 (four) times daily.      Marland Kitchen warfarin (COUMADIN) 5 MG tablet Take 5-7.5 mg by mouth daily. Takes 1 tab (5mg ) daily except for 1.5 tab (7.5mg ) on Tuesday      . Diazepam 10 MG/2ML DEVI Inject 10 mg into the muscle daily as needed. For seizures.       No current facility-administered medications on file prior to visit.   The medication list was reviewed and reconciled. All changes or newly  prescribed medications were explained.  A complete medication list was provided to the patient/caregiver.  Allergies  Allergen Reactions  . Divalproex Sodium Other (See Comments)    Nausea, vomiting, liver and kidney dysfunction  . Phenytoin Rash    Hypersensitivity  reaction  . Vancomycin Rash    Redman's Syndrome    Physical Exam BP 110/70  Pulse 72  Temp(Src) 98 F (36.7 C)  Resp 20  Ht 5' 2.5" (1.588 m)  Wt 109 lb 3.2 oz (49.533 kg)  BMI 19.64 kg/m2  General: thin young man, seated on exam table, in mild evident distress, right-handed, brown hair, brown eyes; Occasionally he holds his head and sighs Head: head microcephalic and atraumatic, His head turned with the right ear toward his right shoulder and chin points the left. He does not have torticollis  Ears, Nose and Throat: beak-like nose. He has a low lying palate.  Neck: supple with no carotid or supraclavicular bruits. He complains of soreness with palpation of the cervical lymph nodes  Respiratory: Lungs are clear to auscultation  Cardiovascular: regular rate and rhythm, soft holosystolic murmur at left sternal border. His nailbeds are pink.  Musculoskeletal: short stature with head tilted to the right. He has tight heel cords.  Skin: no rashes or lesions.  Trunk: no scoliosis. His portacath in the right upper chest. He has a vagal nerve stimulator in the left upper chest.   Neurologic Exam  Mental Status: Awake with mild lethargy. Oriented to place and time. Attention span, concentration, and fund of knowledge appropriate for him. He has evidence of mild cognitive impairment. Mood and affect appropriate. He is tired but interactive with the examiner and his mother. 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 Pupils equal, briskly reactive to light. Extraocular movements full but dysconjugate. Visual fields full to confrontation.  Hearing intact and symmetric to whisper. Facial sensation intact. Face, tongue, palate move normally and symmetrically. Neck flexion and extension normal. 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  Motor: Diminished bulk and normal tone. The patient has mild weakness in his deltoids and biceps in the 4+ over 5 range.He had normal strength in his legs. He has clumsy fine motor movements.  Sensory: Intact to light touch,cold, vibration, and proprioception in all extremities; good stereognosis  Coordination: Rapid alternating movements are clumsy in all extremities. Finger-to-nose and heel-to-shin performed accurately bilaterally. Romberg negative.  Gait and Station: Arises from the exam table without difficulty. Stance is wide based. Gait demonstrates ataxic stride length and fair balance. Able to heel, toe, and tandem walk with difficulty. He is toe walking on the left, and has tight heel cords bilaterally.  Reflexes: Absent and symmetric. Toes downgoing.  Assessment 1. 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 2. 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 3. 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 Daytime somnolence, 780.54. 5. Left ankle pain, 719.47. 6. Headaches unknown etiology, 784.0.  Discussion I have concern about his response to coming off Coumadin for his upcoming surgery.  However, I have discussed this with Dr. Nyoka Cowden and he believes that we will not see propagation of clot when Coumadin is discontinued for the surgery.  I think that there may be passive venous congestion due to decreased venous return in the upper circulation.  However, the patient does not have papilledema.  Tylenol is an appropriate treatment for this.  If his headaches become worse, we could consider a medicine-like Ultram.    His parents are worried that he may be  depressed and I think that is true because he has had a lot of changes in his medical condition.  He is not very vocal and it is hard to know what in particular is  bothering him.  I want to adjust his antiepileptic medications to see if we can improve his side effect symptoms while continuing to control his seizures.  I consider him medically fragile at this time.  I did not interrogate his vagal nerve stimulator today because it was done just a month ago.  Plan Continue to observe his response to Trokendi XR, which was recently switched to topiramate.  We may attempt to do this with some of his other medications place them in extended-release forms to decrease toxicity.  We have nothing to replace the injection of Valium other than to use rectal diazepam gel, or open his port and give him IV Ativan and phenobarbital.  This works fairly well before the study and hopefully will work in the future.  I spent 30 minutes of face-to-face time with the patient and his family, more than half of it in consultation.  I will see him in three months, or sooner.  Deetta Perla MD

## 2013-03-24 ENCOUNTER — Telehealth: Payer: Self-pay | Admitting: Pharmacist

## 2013-03-24 NOTE — Telephone Encounter (Signed)
Message copied by Velda Shell on Thu Mar 24, 2013 12:56 PM ------      Message from: Verne Carrow D      Created: Wed Mar 23, 2013  3:02 PM       Yes. OK to stop from cardiac standpoint. Thayer Ohm            ----- Message -----         From: Mariane Masters, [facility]         Sent: 03/23/2013   2:27 PM           To: Kathleene Hazel, MD            Okay to hold from cardiology standpoint?            ----- Message -----         From: Kathleene Hazel, MD         Sent: 03/23/2013  11:49 AM           To: Mariane Masters, RPH            Thanks Kennon Rounds. Do I need to do anything? Thayer Ohm            ----- Message -----         From: Mariane Masters, [facility]         Sent: 03/23/2013  11:12 AM           To: Kathleene Hazel, MD            Pt due for tendon release procedure in L ankle on 7/21.  Needs clearance to hold Coumadin.  FYI- venous doppler on 6/25 showed DVT in left and right internal jugular veins.  Radiology suspects old clot and Coumadin can be stopped safely (phone note from 6/25)                         ------

## 2013-03-29 DIAGNOSIS — IMO0002 Reserved for concepts with insufficient information to code with codable children: Secondary | ICD-10-CM | POA: Insufficient documentation

## 2013-03-29 DIAGNOSIS — M24573 Contracture, unspecified ankle: Secondary | ICD-10-CM | POA: Insufficient documentation

## 2013-04-01 ENCOUNTER — Ambulatory Visit (INDEPENDENT_AMBULATORY_CARE_PROVIDER_SITE_OTHER): Payer: Medicaid Other | Admitting: *Deleted

## 2013-04-01 DIAGNOSIS — Z7901 Long term (current) use of anticoagulants: Secondary | ICD-10-CM

## 2013-04-01 DIAGNOSIS — I82629 Acute embolism and thrombosis of deep veins of unspecified upper extremity: Secondary | ICD-10-CM

## 2013-04-01 NOTE — Patient Instructions (Addendum)
For procedure on 04/11/2013--tendon release  L ankle Last dose coumadin  04/05/2013 When surgeon instructs to start back coumadin take an extra 1/2 tablet for 2 days

## 2013-04-18 ENCOUNTER — Telehealth: Payer: Self-pay | Admitting: *Deleted

## 2013-04-18 NOTE — Telephone Encounter (Signed)
DJ the patient's mom called and stated that the patient has been having 2 seizures a day since Thursday of last week, the seizures have been lasting anywhere from 45 seconds to 1 and a half minutes. Sunday while riding in the car with [name] had a seizure, mom pulled the car over and gave him oxygen, she stated he went at least 1 minute without breathing and once she gave him oxygen it took about 5 minutes before he responded to her, the second seizure he had at his sister [name]'s house on Sunday evening it lasted 45 seconds to 1 minute and after receiving oxygen he came around very quickly afterwards. After each seizure he has had starting Sunday mom had given him Ativan 2 mg and Phenobarbital 60 mg and her question for Dr. Sharene Skeans and Sula Soda. Is that since [name] has gained weight and weighing 110 lb is it safe to increase his Ativan and the Phenobarbital? I informed mom that Dr. Sharene Skeans was out of the office until Wed. And Inetta Fermo was also out returning tomorrow but that Dr. Merri Brunette was here and I could send him the message but mom stated she would rather wait for Inetta Fermo and Dr. Sharene Skeans because the two if them are very familiar with [name] and all that is going on with him and that if in the meantime [name] continued to have seizures she would take him to the hospital. She confirmed understanding of our phone conversation and once again stated she wanted to wait for the return of Sula Soda. And Dr. Sharene Skeans to address her questions about the possible increase of the patient's medication. DJ can be reached at [phone]    Thanks,  Marcelino Duster B.

## 2013-04-20 ENCOUNTER — Ambulatory Visit (INDEPENDENT_AMBULATORY_CARE_PROVIDER_SITE_OTHER): Payer: Medicaid Other | Admitting: *Deleted

## 2013-04-20 DIAGNOSIS — I82629 Acute embolism and thrombosis of deep veins of unspecified upper extremity: Secondary | ICD-10-CM

## 2013-04-20 DIAGNOSIS — Z7901 Long term (current) use of anticoagulants: Secondary | ICD-10-CM

## 2013-04-20 LAB — POCT INR: INR: 2

## 2013-04-20 NOTE — Telephone Encounter (Signed)
5-1/2 minutes phone call with mother.  I would not increase the Ativan and phenobarbital.  He has kept him from having recurrent seizures.  Increasing the dose in a situation where he is having apnea may make it worse.  I would leave his medications alone.  He is much more alert off carbamazepine and on Trokendi.  He was too sleepy on higher doses of [name].

## 2013-05-02 ENCOUNTER — Other Ambulatory Visit: Payer: Self-pay

## 2013-05-02 MED ORDER — POTASSIUM CHLORIDE CRYS ER 20 MEQ PO TBCR: 20.0000 meq | EXTENDED_RELEASE_TABLET | Freq: Four times a day (QID) | ORAL | Status: DC

## 2013-05-04 ENCOUNTER — Ambulatory Visit (INDEPENDENT_AMBULATORY_CARE_PROVIDER_SITE_OTHER): Payer: Medicaid Other | Admitting: *Deleted

## 2013-05-04 DIAGNOSIS — Z7901 Long term (current) use of anticoagulants: Secondary | ICD-10-CM

## 2013-05-04 DIAGNOSIS — I82629 Acute embolism and thrombosis of deep veins of unspecified upper extremity: Secondary | ICD-10-CM

## 2013-05-04 LAB — POCT INR: INR: 2.2

## 2013-05-12 ENCOUNTER — Telehealth: Payer: Self-pay

## 2013-05-12 ENCOUNTER — Encounter (HOSPITAL_COMMUNITY): Payer: Self-pay | Admitting: *Deleted

## 2013-05-12 ENCOUNTER — Emergency Department (HOSPITAL_COMMUNITY)
Admission: EM | Admit: 2013-05-12 | Discharge: 2013-05-12 | Disposition: A | Discharge status: Home / Self Care | Payer: Medicaid Other | Attending: Emergency Medicine | Admitting: Emergency Medicine

## 2013-05-12 ENCOUNTER — Emergency Department (HOSPITAL_COMMUNITY): Payer: Medicaid Other

## 2013-05-12 DIAGNOSIS — Z79899 Other long term (current) drug therapy: Secondary | ICD-10-CM | POA: Insufficient documentation

## 2013-05-12 DIAGNOSIS — Z7901 Long term (current) use of anticoagulants: Secondary | ICD-10-CM | POA: Insufficient documentation

## 2013-05-12 DIAGNOSIS — Z8679 Personal history of other diseases of the circulatory system: Secondary | ICD-10-CM | POA: Insufficient documentation

## 2013-05-12 DIAGNOSIS — R011 Cardiac murmur, unspecified: Secondary | ICD-10-CM | POA: Insufficient documentation

## 2013-05-12 DIAGNOSIS — Z8659 Personal history of other mental and behavioral disorders: Secondary | ICD-10-CM | POA: Insufficient documentation

## 2013-05-12 DIAGNOSIS — R1110 Vomiting, unspecified: Secondary | ICD-10-CM

## 2013-05-12 DIAGNOSIS — E039 Hypothyroidism, unspecified: Secondary | ICD-10-CM | POA: Insufficient documentation

## 2013-05-12 DIAGNOSIS — R5381 Other malaise: Secondary | ICD-10-CM | POA: Insufficient documentation

## 2013-05-12 DIAGNOSIS — Z8639 Personal history of other endocrine, nutritional and metabolic disease: Secondary | ICD-10-CM | POA: Insufficient documentation

## 2013-05-12 DIAGNOSIS — Z8669 Personal history of other diseases of the nervous system and sense organs: Secondary | ICD-10-CM | POA: Insufficient documentation

## 2013-05-12 DIAGNOSIS — R109 Unspecified abdominal pain: Secondary | ICD-10-CM | POA: Insufficient documentation

## 2013-05-12 DIAGNOSIS — H538 Other visual disturbances: Secondary | ICD-10-CM | POA: Insufficient documentation

## 2013-05-12 DIAGNOSIS — Z862 Personal history of diseases of the blood and blood-forming organs and certain disorders involving the immune mechanism: Secondary | ICD-10-CM | POA: Insufficient documentation

## 2013-05-12 DIAGNOSIS — R112 Nausea with vomiting, unspecified: Secondary | ICD-10-CM | POA: Insufficient documentation

## 2013-05-12 DIAGNOSIS — G40909 Epilepsy, unspecified, not intractable, without status epilepticus: Secondary | ICD-10-CM | POA: Insufficient documentation

## 2013-05-12 LAB — CBC WITH DIFFERENTIAL/PLATELET
Basophils Absolute: 0 10*3/uL (ref 0.0–0.1)
Basophils Relative: 0 % (ref 0–1)
Eosinophils Absolute: 0.1 10*3/uL (ref 0.0–0.7)
Eosinophils Relative: 3 % (ref 0–5)
Lymphocytes Relative: 45 % (ref 12–46)
MCHC: 35.3 g/dL (ref 30.0–36.0)
MCV: 84.7 fL (ref 78.0–100.0)
Platelets: 276 10*3/uL (ref 150–400)
RDW: 13.2 % (ref 11.5–15.5)
WBC: 4 10*3/uL (ref 4.0–10.5)

## 2013-05-12 LAB — URINALYSIS W MICROSCOPIC + REFLEX CULTURE
Ketones, ur: NEGATIVE mg/dL
Leukocytes, UA: NEGATIVE
Protein, ur: NEGATIVE mg/dL
Urobilinogen, UA: 0.2 mg/dL (ref 0.0–1.0)

## 2013-05-12 LAB — COMPREHENSIVE METABOLIC PANEL
ALT: 19 U/L (ref 0–53)
AST: 18 U/L (ref 0–37)
Albumin: 3.2 g/dL — ABNORMAL LOW (ref 3.5–5.2)
Calcium: 8.8 mg/dL (ref 8.4–10.5)
Creatinine, Ser: 0.78 mg/dL (ref 0.50–1.35)
Sodium: 137 mEq/L (ref 135–145)
Total Protein: 7.3 g/dL (ref 6.0–8.3)

## 2013-05-12 LAB — PROTIME-INR
INR: 2.48 — ABNORMAL HIGH (ref 0.00–1.49)
Prothrombin Time: 26 seconds — ABNORMAL HIGH (ref 11.6–15.2)

## 2013-05-12 LAB — GLUCOSE, CAPILLARY: Glucose-Capillary: 108 mg/dL — ABNORMAL HIGH (ref 70–99)

## 2013-05-12 MED ORDER — ONDANSETRON 8 MG PO TBDP: 8.0000 mg | ORAL_TABLET | Freq: Three times a day (TID) | ORAL | Status: DC | PRN

## 2013-05-12 MED ORDER — SODIUM CHLORIDE 0.9 % IV BOLUS (SEPSIS): 1000.0000 mL | Freq: Once | INTRAVENOUS | Status: DC

## 2013-05-12 MED ORDER — ONDANSETRON HCL 4 MG/2ML IJ SOLN
4.0000 mg | Freq: Once | INTRAMUSCULAR | Status: AC
  Administered 2013-05-12: 4 mg via INTRAVENOUS

## 2013-05-12 MED ORDER — ONDANSETRON HCL 4 MG/2ML IJ SOLN
4.0000 mg | Freq: Once | INTRAMUSCULAR | Status: DC
  Filled 2013-05-12 (×2): qty 2

## 2013-05-12 MED ORDER — SODIUM CHLORIDE 0.9 % IV BOLUS (SEPSIS)
1000.0000 mL | Freq: Once | INTRAVENOUS | Status: AC
  Administered 2013-05-12: 1000 mL via INTRAVENOUS

## 2013-05-12 NOTE — Telephone Encounter (Signed)
This is most likely migraine given that he maintained his level of consciousness.  It is unlikely that he had a seizure.

## 2013-05-12 NOTE — ED Provider Notes (Signed)
CSN: [id number]     Arrival date & time 05/12/13  1035 History     First MD Initiated Contact with Patient 05/12/13 1104     Chief Complaint  Patient presents with  . Blurred Vision  . Emesis   (Consider location/radiation/quality/duration/timing/severity/associated sxs/prior Treatment) HPI Patient is a 27 year old male with history of mental retardation, growth disorder, hypothyroidism, seizures who presents to emergency department with complaint of blurred vision and nausea and vomiting. According to the parents who provide most of the history, the patient was getting out of the car when suddenly he started complaining of not being able to see bilateral eyes. Patient did state to his mother he thought his seizure medication level is too high because that is how he felt last time. Shortly after patient developed nausea and vomiting complaining of abdominal pain. Mother called the patient's neurologist who told them to come to emergency department. Patient states his visual changes are improving however vision is still blurred. Patient currently states he feels like he is going to grow out and reporting generalized abdominal pain. According to his mother patient appears somnolent and leg he is unable to focus. Past Medical History  Diagnosis Date  . ALLERGIC RHINITIS 12/26/2009  . MITRAL VALVE PROLAPSE 06/22/2007  . SEIZURE DISORDER 06/22/2007  . Unspecified hypothyroidism 06/22/2007  . Growth disorder   . Mental retardation, mild (I.Q. 50-70)   . Brown's syndrome   . Heart murmur     moderate MR  . Sleep apnea     cpap , last sleep study 10/01/11  . Hypokalemia   . Atrial fibrillation     During port removal and attempted placement in July 2013, transientt  . Hyperlipidemia     No therapy  . Blood clot in vein    Past Surgical History  Procedure Laterality Date  . Implantation vagal nerve stimulator      Left  . Portacath placement      and removal 04/08/12  . Eye surgery      X2  for Brown Syndrome   Family History  Problem Relation Age of Onset  . Breast cancer Mother   . Asthma Father   . Diabetes Father   . Coronary artery disease Maternal Grandfather   . Coronary artery disease Paternal Grandfather   . Pneumonia Paternal Grandmother     Died at 39  . Stroke Paternal Grandmother   . Lung cancer Paternal Grandfather     Died at 71   History  Substance Use Topics  . Smoking status: Never Smoker   . Smokeless tobacco: Never Used  . Alcohol Use: No    Review of Systems  Constitutional: Negative for fever and chills.  HENT: Negative for neck pain and neck stiffness.   Eyes: Positive for visual disturbance. Negative for pain.  Respiratory: Negative for cough, chest tightness and shortness of breath.   Cardiovascular: Negative for chest pain, palpitations and leg swelling.  Gastrointestinal: Positive for nausea, vomiting and abdominal pain. Negative for diarrhea and abdominal distention.  Genitourinary: Negative for dysuria, urgency, frequency and hematuria.  Musculoskeletal: Negative for myalgias and arthralgias.  Skin: Negative for rash.  Allergic/Immunologic: Negative for immunocompromised state.  Neurological: Positive for weakness. Negative for dizziness, light-headedness, numbness and headaches.    Allergies  Divalproex sodium; Phenytoin; and Vancomycin  Home Medications   Current Outpatient Rx  Name  Route  Sig  Dispense  Refill  . carbamazepine (TEGRETOL) 100 MG chewable tablet   Oral  Chew 250 mg by mouth 3 (three) times daily.         . cetirizine (ZYRTEC) 10 MG tablet   Oral   Take 10 mg by mouth daily.         . clobazam ([name]) 10 MG tablet   Oral   Take 5 mg by mouth 3 (three) times daily.         . diazepam (VALIUM) 2 MG tablet   Oral   Take 2 mg by mouth as needed (seizure).         . Diazepam 10 MG/2ML DEVI   Intramuscular   Inject 10 mg into the muscle daily as needed. For seizures.         . lacosamide  (VIMPAT) 50 MG TABS   Alternating Nares   Place 50 mg into alternate nostrils 2 (two) times daily. 50 mg in the morning and 50 mg in the afternoon and 100 mg in the evening         . levETIRAcetam (KEPPRA) 500 MG tablet   Oral   Take 1,500 mg by mouth 2 (two) times daily.         Marland Kitchen levothyroxine (SYNTHROID, LEVOTHROID) 50 MCG tablet   Oral   Take 50 mcg by mouth every morning.          Marland Kitchen LORazepam (ATIVAN) 2 MG/ML concentrated solution   Intravenous   Inject 2 mg into the vein every 8 (eight) hours as needed. It would be given by IV         . Multiple Vitamin (MULTIVITAMIN WITH MINERALS) TABS   Oral   Take 1 tablet by mouth daily.         . phenobarbital (LUMINAL) 60 MG/ML injection   Intravenous   Inject 65 mg into the vein daily as needed. For seizures         . potassium chloride SA (K-DUR,KLOR-CON) 20 MEQ tablet   Oral   Take 20 mEq by mouth 4 (four) times daily.         . potassium phosphate, monobasic, (K-PHOS ORIGINAL) 500 MG tablet   Oral   Take 500 mg by mouth 4 (four) times daily.         . Topiramate ER (TROKENDI XR) 50 MG CP24   Oral   Take 50 mg by mouth at bedtime.          Marland Kitchen TROKENDI XR 200 MG CP24   Oral   Take 200 mg by mouth at bedtime.   31 capsule   5     Dispense as written.   . warfarin (COUMADIN) 5 MG tablet   Oral   Take 5-7.5 mg by mouth daily. Takes 1 tab (5mg ) daily except for 1.5 tab (7.5mg ) on Tuesday          BP 116/80  Pulse 91  Temp(Src) 97.5 F (36.4 C) (Oral)  Resp 18  SpO2 99% Physical Exam  Nursing note and vitals reviewed. Constitutional: He is oriented to person, place, and time. He appears well-developed and well-nourished. No distress.  HENT:  Head: Normocephalic and atraumatic.  Eyes: Conjunctivae and EOM are normal. Pupils are equal, round, and reactive to light.  Strabismus present  Neck: Neck supple.  Cardiovascular: Normal rate, regular rhythm and normal heart sounds.   Pulmonary/Chest:  Effort normal. No respiratory distress. He has no wheezes. He has no rales.  Abdominal: Soft. Bowel sounds are normal. He exhibits no distension. There is tenderness. There is  no rebound.  Generalized tenderness  Musculoskeletal: He exhibits no edema.  Neurological: He is alert and oriented to person, place, and time. No cranial nerve deficit. Coordination normal.  Skin: Skin is warm and dry.    ED Course   Procedures (including critical care time)  Results for orders placed during the hospital encounter of 05/12/13  CBC WITH DIFFERENTIAL      Result Value Range   WBC 4.0  4.0 - 10.5 K/uL   RBC 4.51  4.22 - 5.81 MIL/uL   Hemoglobin 13.5  13.0 - 17.0 g/dL   HCT 16.1 (*) 09.6 - 04.5 %   MCV 84.7  78.0 - 100.0 fL   MCH 29.9  26.0 - 34.0 pg   MCHC 35.3  30.0 - 36.0 g/dL   RDW 40.9  81.1 - 91.4 %   Platelets 276  150 - 400 K/uL   Neutrophils Relative % 45  43 - 77 %   Neutro Abs 1.8  1.7 - 7.7 K/uL   Lymphocytes Relative 45  12 - 46 %   Lymphs Abs 1.8  0.7 - 4.0 K/uL   Monocytes Relative 7  3 - 12 %   Monocytes Absolute 0.3  0.1 - 1.0 K/uL   Eosinophils Relative 3  0 - 5 %   Eosinophils Absolute 0.1  0.0 - 0.7 K/uL   Basophils Relative 0  0 - 1 %   Basophils Absolute 0.0  0.0 - 0.1 K/uL  COMPREHENSIVE METABOLIC PANEL      Result Value Range   Sodium 137  135 - 145 mEq/L   Potassium 3.4 (*) 3.5 - 5.1 mEq/L   Chloride 105  96 - 112 mEq/L   CO2 21  19 - 32 mEq/L   Glucose, Bld 110 (*) 70 - 99 mg/dL   BUN 11  6 - 23 mg/dL   Creatinine, Ser 7.82  0.50 - 1.35 mg/dL   Calcium 8.8  8.4 - 95.6 mg/dL   Total Protein 7.3  6.0 - 8.3 g/dL   Albumin 3.2 (*) 3.5 - 5.2 g/dL   AST 18  0 - 37 U/L   ALT 19  0 - 53 U/L   Alkaline Phosphatase 116  39 - 117 U/L   Total Bilirubin 0.1 (*) 0.3 - 1.2 mg/dL   GFR calc non Af Amer >90  >90 mL/min   GFR calc Af Amer >90  >90 mL/min  CARBAMAZEPINE LEVEL, TOTAL      Result Value Range   Carbamazepine Lvl 11.0  4.0 - 12.0 ug/mL  GLUCOSE, CAPILLARY       Result Value Range   Glucose-Capillary 108 (*) 70 - 99 mg/dL  PROTIME-INR      Result Value Range   Prothrombin Time 26.0 (*) 11.6 - 15.2 seconds   INR 2.48 (*) 0.00 - 1.49  URINALYSIS W MICROSCOPIC + REFLEX CULTURE      Result Value Range   Color, Urine YELLOW  YELLOW   APPearance CLOUDY (*) CLEAR   Specific Gravity, Urine 1.011  1.005 - 1.030   pH 7.5  5.0 - 8.0   Glucose, UA 100 (*) NEGATIVE mg/dL   Hgb urine dipstick NEGATIVE  NEGATIVE   Bilirubin Urine NEGATIVE  NEGATIVE   Ketones, ur NEGATIVE  NEGATIVE mg/dL   Protein, ur NEGATIVE  NEGATIVE mg/dL   Urobilinogen, UA 0.2  0.0 - 1.0 mg/dL   Nitrite NEGATIVE  NEGATIVE   Leukocytes, UA NEGATIVE  NEGATIVE   WBC, UA  0-2  <3 WBC/hpf   RBC / HPF 0-2  <3 RBC/hpf   Urine-Other AMORPHOUS URATES/PHOSPHATES     Ct Head Wo Contrast  05/12/2013   *RADIOLOGY REPORT*  Clinical Data: Blurred vision.  Vomiting.  History of seizure  CT HEAD WITHOUT CONTRAST  Technique:  Contiguous axial images were obtained from the base of the skull through the vertex without contrast.  Comparison: MRI 03/21/2013.  Multiple prior CT scans.  Findings: Extensive calcification throughout the basal ganglia bilaterally is chronic and unchanged from prior studies.  Ventricle size is normal.  Negative for acute hemorrhage, mass, or infarction.  Ventricle size is normal.  Mild mucosal thickening in the right maxillary sinus.  No skull lesion.  IMPRESSION: Extensive basal ganglia calcification, unchanged.  No acute abnormality.   Original Report Authenticated By: Janeece Riggers, M.D.    Date: 05/12/2013  Rate: 76  Rhythm: normal sinus rhythm  QRS Axis: normal  Intervals: normal  ST/T Wave abnormalities: normal  Conduction Disutrbances:none  Narrative Interpretation: Early R wave transition and LVH  Old EKG Reviewed: unchanged    1. Blurred vision   2. Vomiting     MDM  PT with complex hx including seizure disorder, MR, brown's syndrome, hypothyroidism. Will  get labs, levels of tegretol, CT head. At this time, exam unremarkable other than decreased vision.   Labs unremarkable. Will contact pt's neurologist Dr. Sharene Skeans. Pt continues to have blurred vision. Vomiting improved. He is sleeping in the room.   2:01 PM Discussed in detail with Dr. Sharene Skeans neurology. Dr. Sharene Skeans is very familiar with patient his history. Discussed all his findings and labs. At this time do not think that this is a seizure or a CVA. According to Dr. Sharene Skeans, this is potentially a complex migraine. No medications is recommended at this time. Patient is to followup outpatient with Dr. Sharene Skeans.  Filed Vitals:   05/12/13 1044  BP: 116/80  Pulse: 91  Temp: 97.5 F (36.4 C)  TempSrc: Oral  Resp: 18  SpO2: 99%     Lottie Mussel, PA-C 05/13/13 [phone]

## 2013-05-12 NOTE — ED Notes (Signed)
Pt lethargic and unable to completely perform visual acuity test.  Pt was able to read 3/5 numbers at  20/200. pts family stated he would normally be able to distinguish numbers and letters without difficulty.

## 2013-05-12 NOTE — ED Notes (Signed)
Pt takes keppra for seizure disorder.  Mother states that pt began vomiting and stating that he could not see. Neurologist told pt to come to ED.  Per mother, pt has had toxic levels of seizure meds in past with same s/s. Pt now saying he can see, though blurry.  Pt had cast removed today at [facility] post achilles tendon repair 4 weeks prior.  Pt ao x 4.

## 2013-05-12 NOTE — ED Notes (Signed)
Pt discharged.Vital signs stable and GCS 15 

## 2013-05-12 NOTE — Telephone Encounter (Signed)
DL lvm stating that they were on their way to [facility] and [name] told her that he could not see. DJ is wondering if his medications could be at a toxic level and causing this? Wants to know whether to go to the ED or come here? I called mom and she said that this happened all of a sudden and now he is vomiting. He has not been sick and does not have a fever. Please call DJ at [phone].

## 2013-05-12 NOTE — Telephone Encounter (Signed)
I called and talked to Mom. She said that [name] was ok this morning, went to medical appointment where they removed a cast from his foot. They were on his way to a dental appointment when he suddenly said that he could not see. She stopped car to check him and he began vomiting. He has stopped vomiting but continues to say that he cannot see. He has not had a seizure. I told Mom that if [name] is reporting loss of vision that he needs to be seen in ER. She agreed and will take him to ER.  TG

## 2013-05-12 NOTE — ED Notes (Signed)
Changed discharge status due to low BP.  Admin fluids at this time until BP rises.  Family sts bp usually 110/65.

## 2013-05-13 ENCOUNTER — Telehealth: Payer: Self-pay

## 2013-05-13 NOTE — Telephone Encounter (Signed)
[name], mom, lvm stating that the nurse from the ED visit called her this morning to check on [name]. She told the nurse that he is having facial swelling and can barely open his right eye. Mom noticed some swelling yesterday but more severe today. The nurse told her that his face should not be swelling and to call our office and let us know. Mom wants to know if [name] can be seen by Dr.H today? Please call [name] at [phone].

## 2013-05-13 NOTE — Telephone Encounter (Signed)
I called and talked to Mom. She said that [name] says that he feels fine today and that his vision is back to normal. He has swelling in his right face, especially around right eye. Mom thinks one hand looks a little swollen. She feels that the swelling has improved some since he has gotten out of bed and has been moving around. In reviewing his ER record from yesterday and talking to Mom, he received at least of IV fluids. I told Mom that the edema may be from that and that since [place] feels well, that I would recommended that he sit upright or move about and not lie down for awhile. I will call her back in 2 hours to check on him. Mom agreed with this plan. TG

## 2013-05-13 NOTE — Telephone Encounter (Signed)
I called back to check on [name] as previously planned with Mom. She said that the edema was improving and that [name] was feeling ok. She asked to schedule follow up appointment with Dr Sharene Skeans next week to discuss the presumed migraine [name] had yesterday and I scheduled him for Tues 05/17/13 @ 4pm, arrival time 3:45pm. Mom agreed with this plan. TG

## 2013-05-13 NOTE — ED Provider Notes (Signed)
Medical screening examination/treatment/procedure(s) were performed by non-physician practitioner and as supervising physician I was immediately available for consultation/collaboration.   Gwyneth Sprout, MD 05/13/13 1018

## 2013-05-13 NOTE — Telephone Encounter (Signed)
Noted and agree. 

## 2013-05-17 ENCOUNTER — Encounter: Payer: Self-pay | Admitting: Pediatrics

## 2013-05-17 ENCOUNTER — Ambulatory Visit (INDEPENDENT_AMBULATORY_CARE_PROVIDER_SITE_OTHER): Payer: Medicaid Other | Admitting: Pediatrics

## 2013-05-17 VITALS — BP 110/74 | HR 96 | Ht 62.5 in | Wt 113.4 lb

## 2013-05-17 DIAGNOSIS — R269 Unspecified abnormalities of gait and mobility: Secondary | ICD-10-CM

## 2013-05-17 DIAGNOSIS — I82729 Chronic embolism and thrombosis of deep veins of unspecified upper extremity: Secondary | ICD-10-CM

## 2013-05-17 DIAGNOSIS — G40219 Localization-related (focal) (partial) symptomatic epilepsy and epileptic syndromes with complex partial seizures, intractable, without status epilepticus: Secondary | ICD-10-CM

## 2013-05-17 DIAGNOSIS — F70 Mild intellectual disabilities: Secondary | ICD-10-CM

## 2013-05-17 DIAGNOSIS — G43809 Other migraine, not intractable, without status migrainosus: Secondary | ICD-10-CM

## 2013-05-17 DIAGNOSIS — I82723 Chronic embolism and thrombosis of deep veins of upper extremity, bilateral: Secondary | ICD-10-CM

## 2013-05-17 DIAGNOSIS — G40119 Localization-related (focal) (partial) symptomatic epilepsy and epileptic syndromes with simple partial seizures, intractable, without status epilepticus: Secondary | ICD-10-CM

## 2013-05-17 DIAGNOSIS — G4733 Obstructive sleep apnea (adult) (pediatric): Secondary | ICD-10-CM

## 2013-05-17 DIAGNOSIS — G40319 Generalized idiopathic epilepsy and epileptic syndromes, intractable, without status epilepticus: Secondary | ICD-10-CM

## 2013-05-17 MED ORDER — TROKENDI XR 50 MG PO CP24: 2.0000 | ORAL_CAPSULE | Freq: Every day | ORAL | Status: DC

## 2013-05-17 MED ORDER — VIMPAT 50 MG PO TABS: ORAL_TABLET | ORAL | Status: DC

## 2013-05-17 NOTE — Progress Notes (Signed)
Patient: [name] MRN: [id number] Sex: male DOB: 1986/08/26  Provider: Deetta Perla, MD Location of Care: [facility]  Note type: Urgent return visit  History of Present Illness: Referral Source: [facility] History from: mother, patient and [facility] chart Chief Complaint: Migraine/Loss of Vision  [name] is a 27 y.o. male who returns for evaluation and management of intractable seizures.  "[name]" returns today on May 17, 2013, for the first time since March 23, 2013.  He has a long and complex history with seizures that began at age 49 and were associated with well-developed episodes of status epilepticus and hospitalizations.  He has simple and complex partial seizures with secondary generalization.  He is on polypharmacy with antiepileptic drugs.  Attempts to taper discontinue medications have not been successful.  Much of his past history is recorded below.  He returns today because he had an episode on May 12, 2013, where he complained of not being able to see.  He clarifies this today and said that his vision was blurry and remained blurry.  He never lost vision altogether.  Shortly thereafter, he developed nausea and vomiting and had abdominal pain.  He never complained of headache until much later.  My office was contacted and recommended that he go to the emergency room for evaluation.  The patient did not have any seizures associated with this.    His examination showed improving vision, although it remained blurred.  He had a normal CBC with a white count that has always been in the low normal or low range at 4000 with otherwise normal indices, potassium of 3.4, which has been a chronic problem thought to be renal in nature, slightly elevated glucose at 110, low albumin at 3.2 reflecting his nutritional state, elevated prothrombin time (he is on warfarin and normal urinalysis).  He had a CT scan of the brain, which is compared with an  MRI of the brain on March 21, 2013.  The patient has extensive calcification to the basal ganglia bilaterally, normal ventricle size, and no acute findings.  EKG was unchanged.    I discussed the case and suggested that this was more likely than not a complicated migraine and without the headache a migraine variant. The patient returns today for ongoing follow up.  His mother tells me that his seizure control has actually been quite good for him.  His last seizure was a couple of weeks ago.  We introduced the medication long-acting topiramate medication Trokendi and this has helped his mental status.  He still has very little energy.  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  This is why he is on warfarin, although it is not clear that warfarin is doing anything to prevent further clot formation.  I note also the patient had ankle surgery on April 11, 2013, to improve a very tight heel cord.  He is recovering from that well and came today in a wheelchair, so he is not to bear weight on his legs.  Overall, his health has been good.  He has not experienced further episodes similar to that on May 12, 2013.  Since his last visit March 23, 2013, he continues to have loss of interest in things that used to excite him, excessive sleepiness, lack of socialization with peers, and his family. Seizures have lessened. 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   He was admitted after a prolonged generalized tonic-clonic seizure with apnea requiring CPR and remained in the hospital from February 26, 2013, through the February 28, 2013. He was treated with IV Ativan and phenobarbital, which stopped his seizures, but was hospitalized for observation. No change was made in his medications.   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 He had a venous Doppler done, which showed clot within the jugular veins bilaterally. I had this evaluated by the vascular radiologist who placed the port in his superior circulation and he said that this is not a new finding and that the patient had significant obstruction of his superior venous return, most of which made its way to the heart through of writer collaterals deep within his mediastinum. He doubts that Coumadin is not doing much and says that if one collateral clots off, there will be a time when he has edema until other collaterals open up.   The patient has had daily headaches. These may be in part related to diminished venous return. I think it may be making him feel badly. There are numerous times today where he leaned forward and rubbed his head and said that his head hurt. Because he is on Coumadin, there are limited medications that can be used to treat his headaches.   His parents also observed that he was using his left arm less than he had been. MRI scan of the brain shows no significant change. There are areas of mild increased signal in the FLAIR scan that are not seen on T2. These are diffuse, discrete, and involve the white matter. They are nonspecific. 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 There is no evidence of ischemic change, recent or remote on MRI scan and basically it is unchanged from previous studies.  Procedure: Interrogation and reprogramming of Vagal Nerve Stimulator.  Implantation of the current stimulator on December 09, 2005 serial number [id number]  Interrogation of the vagal nerve stimulator shows that there has been one magnet stimuli since October 01, 2012. (total of 1118) Voltage 2.25 mA.  Frequency 20 Hz, pulse width 250 microseconds, signal time on 7 seconds, signal time off 30 seconds, magnet current 2.50 mA, signal time on 60 seconds, pulse width 250 microseconds.  The  normal mode diagnostics using his current settings was also normal, the DC-DC conversion code was 3.  There is no indication to change the battery.  He tolerated the procedure well.  Review of Systems: 12 system review was remarkable for loss of vision,allergies and seizures  Past Medical History  Diagnosis Date  . ALLERGIC RHINITIS 12/26/2009  . MITRAL VALVE PROLAPSE 06/22/2007  . SEIZURE DISORDER 06/22/2007  . Unspecified hypothyroidism 06/22/2007  . Growth disorder   . Mental retardation, mild (I.Q. 50-70)   . Brown's syndrome   . Heart murmur     moderate MR  . Sleep apnea     cpap , last sleep study 10/01/11  . Hypokalemia   . Atrial fibrillation     During port removal and attempted placement in July 2013, transientt  . Hyperlipidemia     No therapy  . Blood clot in vein    Hospitalizations: yes, Head Injury: no, Nervous System Infections: no, Immunizations up to date: yes Past Medical History Comments: .  Diagnosis of developmental delay as a toddler. He was a breech  presentation, cesarean section delivery. He had intrauterine growth retardation, and failure to thrive. EEG 11/07/86 was normal for gestational age. Genetics evaluation short stature, prenatal onset, chromosomal study: 61 XY, fragile X syndrome negative, evaluation for MELAS and MERRF were negative.  Initial seizure 08/28/94 left locomotor with secondary generalization  MRI of the brain 09/13/93: Scattered subcortical white matter lesions at the parietotemporal parieto-occipital junction is ischemic versus hamartomas.  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 Tegretol 30 mcg for militate. 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  Topiramate started in May 1999 unable to be tolerated with Tegretol and was tapered and discontinued  January 2000 EEG showed right greater than left mid temporal sharp waves was otherwise well-organized EEG. He hospitalizations in March, July, October, in November for recurrent seizures.  Patient placed on Depakote in April 2000 and developed gagging abdominal pain headache and had significant change in transaminases and liver functions. Topamax is restarted and Keppra was added. Vimpat was started July 16, 2012 and adjusted upwards. [name] was added on November 18, 2012 and has been adjusted upwards.  Topamax was changed to Trokendi XR in attempt to deal with sleepiness unsteadiness caused by polypharmacy.  We to these medications have been introduced, the patient experiences somewhat improved seizure control however Is quite likely that despite better seizure control, the patient is experiencing impairment in cognition and gait as a result of polypharmacy.  Behavior History none  Surgical History Past Surgical History  Procedure Laterality Date  . Implantation vagal nerve stimulator      Left  . Portacath placement      and removal 04/08/12  . Eye surgery      X2 for Brown Syndrome   Surgeries: yes Surgical History Comments: See Hx  Family History family history includes Asthma in his father; Breast cancer in his mother; Coronary artery disease in his maternal grandfather and paternal grandfather; Diabetes in his father;  Lung cancer in his paternal grandfather; Pneumonia in his paternal grandmother; Stroke in his paternal grandmother. Family History is negative migraines, seizures, cognitive impairment, blindness, deafness, birth defects, chromosomal disorder, autism.  Social History History   Social History  . Marital Status: Single    Spouse Name: N/A    Number of Children: 0  . Years of Education: N/A   Occupational History  . Disabled    Social History Main Topics  . Smoking status: Never Smoker   . Smokeless tobacco: Never Used  . Alcohol Use: No  . Drug Use: No  . Sexual Activity: No   Other Topics Concern  . None   Social History Narrative  . None   Living with both parents    Current Outpatient Prescriptions on File Prior to Visit  Medication Sig Dispense Refill  . carbamazepine (TEGRETOL) 100 MG chewable tablet Chew 250 mg by mouth 3 (three)  times daily.      . cetirizine (ZYRTEC) 10 MG tablet Take 10 mg by mouth daily.      . clobazam ([name]) 10 MG tablet Take 5 mg by mouth 3 (three) times daily.      . diazepam (VALIUM) 2 MG tablet Take 2 mg by mouth as needed (seizure).      Marland Kitchen levETIRAcetam (KEPPRA) 500 MG tablet Take 1,500 mg by mouth 2 (two) times daily.      Marland Kitchen levothyroxine (SYNTHROID, LEVOTHROID) 50 MCG tablet Take 50 mcg by mouth every morning.       Marland Kitchen LORazepam (ATIVAN) 2 MG/ML concentrated solution Inject 2 mg into the vein every 8 (eight) hours as needed. It would be given by IV      . Multiple Vitamin (MULTIVITAMIN WITH MINERALS) TABS Take 1 tablet by mouth daily.      . ondansetron (ZOFRAN ODT) 8 MG disintegrating tablet Take 1 tablet (8 mg total) by mouth every 8 (eight) hours as needed for nausea.  10 tablet  0  . phenobarbital (LUMINAL) 60 MG/ML injection Inject 65 mg into the vein daily as needed. For seizures      . potassium chloride SA (K-DUR,KLOR-CON) 20 MEQ tablet Take 20 mEq by mouth 4 (four) times daily.      . potassium phosphate, monobasic, (K-PHOS  ORIGINAL) 500 MG tablet Take 500 mg by mouth 4 (four) times daily.      . Topiramate ER (TROKENDI XR) 50 MG CP24 Take 50 mg by mouth at bedtime. Take 2 capsules at bedtime.      Marland Kitchen TROKENDI XR 200 MG CP24 Take 200 mg by mouth at bedtime.  31 capsule  5  . warfarin (COUMADIN) 5 MG tablet Take 5-7.5 mg by mouth daily. Takes 1 tab (5mg ) daily except for 1.5 tab (7.5mg ) on Tuesday      . Diazepam 10 MG/2ML DEVI Inject 10 mg into the muscle daily as needed. For seizures.       No current facility-administered medications on file prior to visit.   The medication list was reviewed and reconciled. All changes or newly prescribed medications were explained.  A complete medication list was provided to the patient/caregiver.  Allergies  Allergen Reactions  . Divalproex Sodium Other (See Comments)    Nausea, vomiting, liver and kidney dysfunction  . Phenytoin Rash    Hypersensitivity  reaction  . Vancomycin Rash    Redman's Syndrome    Physical Exam BP 110/74  Pulse 96  Ht 5' 2.5" (1.588 m)  Wt 113 lb 6.4 oz (51.438 kg)  BMI 20.4 kg/m2  General: thin young man, seated in wheelchair, in no distress, right-handed, brown hair, brown eyes; Occasionally he holds his head and sighs  Head: head microcephalic and atraumatic, His head turned with the right ear toward his right shoulder and chin points the left. He does not have torticollis. Left face and neck are swollen. Ears, Nose and Throat: beak-like nose. He has a low lying palate.  Neck: supple with no carotid or supraclavicular bruits.  Respiratory: Lungs are clear to auscultation  Cardiovascular: regular rate and rhythm, soft holosystolic murmur at left sternal border. His nailbeds are pink.  Musculoskeletal: short stature with head tilted to the right. He has tight heel cords.  Skin: no rashes or lesions.  Trunk: no scoliosis. His portacath in the right upper chest. He has a vagal nerve stimulator in the left upper chest.  Neurologic Exam   Mental Status: Awake  with mild lethargy. Oriented to place and time. Attention span, concentration, and fund of knowledge appropriate for him. He has evidence of mild cognitive impairment. Mood and affect appropriate. He is tired but interactive with the examiner and his mother. 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 Pupils equal, briskly reactive to light. Extraocular movements full but dysconjugate. Visual fields full to confrontation. Hearing intact and symmetric to whisper. Facial sensation intact. Face, tongue, palate move normally and symmetrically. Neck flexion and extension normal. 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  Motor: Diminished bulk and normal tone. The patient has mild weakness in his deltoids and biceps in the 4+ over 5 range.He had normal strength in his legs. He has clumsy fine motor movements.  Sensory: Intact to light touch,cold, vibration, and proprioception in all extremities; good stereognosis  Coordination: Rapid alternating movements are clumsy in all extremities. Finger-to-nose and heel-to-shin performed accurately bilaterally. Romberg negative.  Gait and Station: not tested today Reflexes: Absent and symmetric. Toes downgoing.  Assessment 1. 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 2. 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 3. Migraine variant, 346.20. 4. 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 5. Gait abnormality chronic and stable, 781.2. 6. Obstructive sleep apnea chronic and stable, 327.23. 7. 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  Plan The patient will continue his current antiepileptic drugs.  I refilled prescriptions for 50 mg Trokendi XR and for Vimpat.  He is on three trade drugs for seizures: Vimpat, Keppra, and Trokendi.  He  is on generic carbamazepine.  I also interrogated his vagal nerve stimulator and this is included.  The stimulator continues to work well and was not readjusted.  I spent 30 minutes of face-to-face time with the patient and his family, more than half of it in consultation.  I interrogated his vagal nerve stimulator.  He will return in three months' time, sooner depending on clinical need.  He must be near the end of service for his vagal nerve stimulator and needs to be followed closely for this, so that it can be replaced on a timely basis.  I will not provide additional treatment for his migraine variants, unless they become frequent, or debilitating.  Deetta Perla MD

## 2013-05-18 ENCOUNTER — Other Ambulatory Visit (HOSPITAL_COMMUNITY): Payer: Self-pay | Admitting: Cardiovascular Disease

## 2013-05-18 DIAGNOSIS — I059 Rheumatic mitral valve disease, unspecified: Secondary | ICD-10-CM

## 2013-05-19 ENCOUNTER — Other Ambulatory Visit (HOSPITAL_COMMUNITY): Payer: Self-pay

## 2013-05-20 ENCOUNTER — Ambulatory Visit (HOSPITAL_COMMUNITY): Payer: Medicaid Other | Attending: Family Medicine | Admitting: Radiology

## 2013-05-20 ENCOUNTER — Other Ambulatory Visit: Payer: Self-pay | Admitting: Family

## 2013-05-20 DIAGNOSIS — I379 Nonrheumatic pulmonary valve disorder, unspecified: Secondary | ICD-10-CM | POA: Insufficient documentation

## 2013-05-20 DIAGNOSIS — Z86718 Personal history of other venous thrombosis and embolism: Secondary | ICD-10-CM | POA: Insufficient documentation

## 2013-05-20 DIAGNOSIS — I4891 Unspecified atrial fibrillation: Secondary | ICD-10-CM | POA: Insufficient documentation

## 2013-05-20 DIAGNOSIS — R269 Unspecified abnormalities of gait and mobility: Secondary | ICD-10-CM | POA: Insufficient documentation

## 2013-05-20 DIAGNOSIS — G40909 Epilepsy, unspecified, not intractable, without status epilepticus: Secondary | ICD-10-CM | POA: Insufficient documentation

## 2013-05-20 DIAGNOSIS — G4733 Obstructive sleep apnea (adult) (pediatric): Secondary | ICD-10-CM | POA: Insufficient documentation

## 2013-05-20 DIAGNOSIS — I059 Rheumatic mitral valve disease, unspecified: Secondary | ICD-10-CM | POA: Insufficient documentation

## 2013-05-20 NOTE — Progress Notes (Signed)
Echocardiogram performed.  

## 2013-05-21 NOTE — Addendum Note (Signed)
Addended by: Deetta Perla on: 05/21/2013 02:40 PM   Modules accepted: Level of Service

## 2013-05-25 ENCOUNTER — Other Ambulatory Visit: Payer: Self-pay

## 2013-05-25 ENCOUNTER — Ambulatory Visit (INDEPENDENT_AMBULATORY_CARE_PROVIDER_SITE_OTHER): Payer: Medicaid Other | Admitting: *Deleted

## 2013-05-25 DIAGNOSIS — Z7901 Long term (current) use of anticoagulants: Secondary | ICD-10-CM

## 2013-05-25 DIAGNOSIS — G40319 Generalized idiopathic epilepsy and epileptic syndromes, intractable, without status epilepticus: Secondary | ICD-10-CM

## 2013-05-25 DIAGNOSIS — I82629 Acute embolism and thrombosis of deep veins of unspecified upper extremity: Secondary | ICD-10-CM

## 2013-05-25 LAB — POCT INR: INR: 2.1

## 2013-05-25 MED ORDER — ONFI 10 MG PO TABS: ORAL_TABLET | ORAL | Status: DC

## 2013-05-26 ENCOUNTER — Telehealth: Payer: Self-pay

## 2013-05-26 DIAGNOSIS — G4733 Obstructive sleep apnea (adult) (pediatric): Secondary | ICD-10-CM

## 2013-05-26 DIAGNOSIS — G40219 Localization-related (focal) (partial) symptomatic epilepsy and epileptic syndromes with complex partial seizures, intractable, without status epilepticus: Secondary | ICD-10-CM

## 2013-05-26 DIAGNOSIS — G40119 Localization-related (focal) (partial) symptomatic epilepsy and epileptic syndromes with simple partial seizures, intractable, without status epilepticus: Secondary | ICD-10-CM

## 2013-05-26 DIAGNOSIS — I82729 Chronic embolism and thrombosis of deep veins of unspecified upper extremity: Secondary | ICD-10-CM

## 2013-05-26 DIAGNOSIS — G43809 Other migraine, not intractable, without status migrainosus: Secondary | ICD-10-CM

## 2013-05-26 DIAGNOSIS — G40319 Generalized idiopathic epilepsy and epileptic syndromes, intractable, without status epilepticus: Secondary | ICD-10-CM

## 2013-05-26 DIAGNOSIS — R269 Unspecified abnormalities of gait and mobility: Secondary | ICD-10-CM

## 2013-05-26 NOTE — Telephone Encounter (Signed)
I will do order for new wheelchair. Please find out if Mom wants to pick up order or if she has a fax number for [employer]. Thanks, Inetta Fermo

## 2013-05-26 NOTE — Telephone Encounter (Signed)
I called [name] and she is going to pick the order up tomorrow.

## 2013-05-26 NOTE — Telephone Encounter (Signed)
DJ, mom, lvm stating that they are having mechanical problems with [name]'s w/c, it's old and falling apart. She spoke with someone at [facility] on [address] and was told that Medicaid will pay for a new one. They need a Rx written for a "Light Weight Fold Up Wheelchair". The Rx has to have his Dx on it. Please call DJ with any questions at [phone].

## 2013-05-30 ENCOUNTER — Ambulatory Visit: Payer: Self-pay | Admitting: Cardiovascular Disease

## 2013-06-14 ENCOUNTER — Ambulatory Visit (INDEPENDENT_AMBULATORY_CARE_PROVIDER_SITE_OTHER): Payer: Medicaid Other | Admitting: Cardiovascular Disease

## 2013-06-14 ENCOUNTER — Encounter: Payer: Self-pay | Admitting: Cardiovascular Disease

## 2013-06-14 ENCOUNTER — Ambulatory Visit (INDEPENDENT_AMBULATORY_CARE_PROVIDER_SITE_OTHER): Payer: Medicaid Other

## 2013-06-14 VITALS — BP 97/66 | HR 75 | Ht 63.0 in | Wt 110.0 lb

## 2013-06-14 DIAGNOSIS — I4891 Unspecified atrial fibrillation: Secondary | ICD-10-CM

## 2013-06-14 DIAGNOSIS — Z7901 Long term (current) use of anticoagulants: Secondary | ICD-10-CM

## 2013-06-14 DIAGNOSIS — I340 Nonrheumatic mitral (valve) insufficiency: Secondary | ICD-10-CM

## 2013-06-14 DIAGNOSIS — I059 Rheumatic mitral valve disease, unspecified: Secondary | ICD-10-CM

## 2013-06-14 DIAGNOSIS — I82629 Acute embolism and thrombosis of deep veins of unspecified upper extremity: Secondary | ICD-10-CM

## 2013-06-14 LAB — POCT INR: INR: 2

## 2013-06-14 NOTE — Patient Instructions (Addendum)
Your physician wants you to follow-up in:  12 months.  You will receive a reminder letter in the mail two months in advance. If you don't receive a letter, please call our office to schedule the follow-up appointment.   

## 2013-06-14 NOTE — Progress Notes (Signed)
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 Berton Mount on 04/08/12. He was admitted on 04/08/12 to get a [name] replacement. They removed the old [name] but were not able to insert a new one because of right-sided central venous occlusion. During wire manipulatoin, he developed rapid atrial fibrillation.  In PACU, the atrial fibrillation resolved without medical intervention. At the time of evaluation, he was maintaining sinus rhythm and had no recurrence during the hospital stay. He has frequent seizures and had one during the cardiology consultation per records.   He tells me that he feels well. He has been having seizures 3-4 times per month. He has no other complaints. His breathing has been ok. No chest pain.   Primary Care Physician: Antony Haste  Past Medical History  Diagnosis Date  . ALLERGIC RHINITIS 12/26/2009  . MITRAL VALVE PROLAPSE 06/22/2007  . SEIZURE DISORDER 06/22/2007  . Unspecified hypothyroidism 06/22/2007  . Growth disorder   . Mental retardation, mild (I.Q. 50-70)   . Brown's syndrome   . Heart murmur     moderate MR  . Sleep apnea     cpap , last sleep study 10/01/11  . Hypokalemia   . Atrial fibrillation     During port removal and attempted placement in July 2013, transientt  . Hyperlipidemia     No therapy  . Blood clot in vein     Past Surgical History  Procedure Laterality Date  . Implantation vagal nerve stimulator      Left  . Portacath placement      and removal 04/08/12  . Eye surgery      X2 for [facility] Syndrome    Current Outpatient Prescriptions  Medication Sig Dispense Refill  . carbamazepine (TEGRETOL) 100 MG chewable tablet Chew 250 mg by mouth 3 (three) times daily.      .  cetirizine (ZYRTEC) 10 MG tablet Take 10 mg by mouth daily.      . diazepam (VALIUM) 2 MG tablet TAKE ONE TABLET BY MOUTH AS NEEDED  20 tablet  5  . levETIRAcetam (KEPPRA) 500 MG tablet Take 1,500 mg by mouth 2 (two) times daily.      Marland Kitchen levothyroxine (SYNTHROID, LEVOTHROID) 50 MCG tablet Take 50 mcg by mouth every morning.       Marland Kitchen LORazepam (ATIVAN) 2 MG/ML concentrated solution Inject 2 mg into the vein every 8 (eight) hours as needed. It would be given by IV      . Multiple Vitamin (MULTIVITAMIN WITH MINERALS) TABS Take 1 tablet by mouth daily.      . ondansetron (ZOFRAN ODT) 8 MG disintegrating tablet Take 1 tablet (8 mg total) by mouth every 8 (eight) hours as needed for nausea.  10 tablet  0  . [name] 10 MG tablet Take 5 mg by mouth 3 times daily  45 tablet  3  . phenobarbital (LUMINAL) 60 MG/ML injection Inject 65 mg into the vein daily as needed. For seizures      . potassium chloride SA (K-DUR,KLOR-CON) 20 MEQ tablet Take 20 mEq by mouth 4 (four) times daily.      . potassium phosphate, monobasic, (K-PHOS ORIGINAL) 500 MG tablet Take 500 mg by mouth 4 (four) times daily.      Marland Kitchen TROKENDI XR 200 MG  CP24 Take 200 mg by mouth at bedtime.  31 capsule  5  . TROKENDI XR 50 MG CP24 Take 2 capsules by mouth at bedtime.  62 capsule  5  . VIMPAT 50 MG TABS tablet One in the morning, 1 at midday, and 2 at nighttime  124 tablet  5  . warfarin (COUMADIN) 5 MG tablet Take 5-7.5 mg by mouth daily. Takes 1 tab (5mg ) daily except for 1.5 tab (7.5mg ) on Tuesday       No current facility-administered medications for this visit.    Allergies  Allergen Reactions  . Divalproex Sodium Other (See Comments)    Nausea, vomiting, liver and kidney dysfunction  . Phenytoin Rash    Hypersensitivity  reaction  . Vancomycin Rash    Redman's Syndrome    History   Social History  . Marital Status: Single    Spouse Name: N/A    Number of Children: 0  . Years of Education: N/A   Occupational History  .  Disabled    Social History Main Topics  . Smoking status: Never Smoker   . Smokeless tobacco: Never Used  . Alcohol Use: No  . Drug Use: No  . Sexual Activity: No   Other Topics Concern  . Not on file   Social History Narrative  . No narrative on file    Family History  Problem Relation Age of Onset  . Breast cancer Mother   . Asthma Father   . Diabetes Father   . Coronary artery disease Maternal Grandfather   . Coronary artery disease Paternal Grandfather   . Pneumonia Paternal Grandmother     Died at 4  . Stroke Paternal Grandmother   . Lung cancer Paternal Grandfather     Died at 63    Review of Systems:  As stated in the HPI and otherwise negative.   BP 97/66  Pulse 75  Ht 5\' 3"  (1.6 m)  Wt 110 lb (49.896 kg)  BMI 19.49 kg/m2  Physical Examination: General: Well developed, well nourished, NAD HEENT: OP clear, mucus membranes moist SKIN: warm, dry. No rashes. Neuro: No focal deficits Musculoskeletal: Muscle strength 5/5 all ext Psychiatric: Mood and affect normal Neck: No JVD, no carotid bruits, no thyromegaly, no lymphadenopathy. Lungs:Clear bilaterally, no wheezes, rhonci, crackles Cardiovascular: Regular rate and rhythm. Slight systolic  Murmur. No gallops or rubs. Abdomen:Soft. Bowel sounds present. Non-tender.  Extremities: No lower extremity edema. Pulses are 2 + in the bilateral DP/PT.  Echo 05/20/13: Left ventricle: The cavity size was mildly dilated. Wall thickness was normal. Systolic function was normal. The estimated ejection fraction was in the range of 60% to 65%. Wall motion was normal; there were no regional wall motion abnormalities. - Mitral valve: Moderate regurgitation.  Assessment and Plan:   1. Atrial fibrillation: One episode during a procedure in July. Likely induced by the wire. No recurrence. He is in normal sinus rhythm today. No further workup or treatment.  He is on coumadin for upper extremity DVT.    2. Mitral  regurgitation: Moderate MR by echo August 2014. Unchanged from echo in 2013.

## 2013-06-21 ENCOUNTER — Other Ambulatory Visit: Payer: Self-pay | Admitting: Family

## 2013-06-21 ENCOUNTER — Telehealth: Payer: Self-pay

## 2013-06-21 NOTE — Telephone Encounter (Signed)
DJ, mom, lvm stating that child went to see Dr.Badger recently. He told [name] that he had high cholesterol and wants to start him on Crestor. Mom wants to know what Dr.H thinks about this before he starts taking it. Please call mom at [phone].

## 2013-06-21 NOTE — Telephone Encounter (Signed)
Dr Sharene Skeans, I will call Mom back - do you have any reservations about the Crestor? [name]

## 2013-06-21 NOTE — Telephone Encounter (Signed)
I called Mom with this information. TG

## 2013-06-21 NOTE — Telephone Encounter (Signed)
I think that it should be OK.

## 2013-06-22 ENCOUNTER — Telehealth: Payer: Self-pay

## 2013-06-22 ENCOUNTER — Encounter: Payer: Self-pay | Admitting: Family

## 2013-06-22 NOTE — Telephone Encounter (Signed)
Daria, customer support for [employer], called and said that Medicaid is going to need more information before they will approve the wheelchair. She said that they need an office visit note or letter stating the reason [name] needs the wheelchair. This needs to include the distance for which he can ambulate. Please fax the supporting documentation to Daria at [phone]. If you need to contact Daria she can be reached at [phone].

## 2013-06-22 NOTE — Telephone Encounter (Signed)
I left a message for [name] to call me. I have drafted a letter for Dr Darl Householder signature and will send it when signed. TG

## 2013-06-22 NOTE — Telephone Encounter (Signed)
Letter supporting need for wheelchair faxed to Daria at [facility] TG

## 2013-06-23 ENCOUNTER — Ambulatory Visit: Payer: Medicaid Other | Admitting: Pediatrics

## 2013-07-04 ENCOUNTER — Ambulatory Visit (INDEPENDENT_AMBULATORY_CARE_PROVIDER_SITE_OTHER): Payer: Medicaid Other | Admitting: Pediatrics

## 2013-07-04 ENCOUNTER — Encounter: Payer: Self-pay | Admitting: Pediatrics

## 2013-07-04 VITALS — BP 84/60 | HR 72 | Ht 62.5 in | Wt 112.2 lb

## 2013-07-04 DIAGNOSIS — R269 Unspecified abnormalities of gait and mobility: Secondary | ICD-10-CM

## 2013-07-04 DIAGNOSIS — I82729 Chronic embolism and thrombosis of deep veins of unspecified upper extremity: Secondary | ICD-10-CM

## 2013-07-04 DIAGNOSIS — G40319 Generalized idiopathic epilepsy and epileptic syndromes, intractable, without status epilepticus: Secondary | ICD-10-CM

## 2013-07-04 DIAGNOSIS — E876 Hypokalemia: Secondary | ICD-10-CM

## 2013-07-04 DIAGNOSIS — Z79899 Other long term (current) drug therapy: Secondary | ICD-10-CM

## 2013-07-04 DIAGNOSIS — G40119 Localization-related (focal) (partial) symptomatic epilepsy and epileptic syndromes with simple partial seizures, intractable, without status epilepticus: Secondary | ICD-10-CM

## 2013-07-04 DIAGNOSIS — I82723 Chronic embolism and thrombosis of deep veins of upper extremity, bilateral: Secondary | ICD-10-CM

## 2013-07-04 MED ORDER — POTASSIUM PHOSPHATE MONOBASIC 500 MG PO TABS: 500.0000 mg | ORAL_TABLET | Freq: Four times a day (QID) | ORAL | Status: DC

## 2013-07-04 NOTE — Progress Notes (Signed)
Patient: [name] MRN: [id number] Sex: male DOB: September 07, 1986  Provider: Deetta Perla, MD Location of Care: [facility]  Note type: Routine return visit  History of Present Illness: Referral Source: [facility] History from: father, patient and [facility] chart Chief Complaint: Seizures  [name] is a 27 y.o. male who returns for ongoing evaluation and management of intractable seizures.  The patient returns on July 04, 2013, for the first time since May 17, 2013.  He has simple and complex partial seizures with secondary generalization.  These began at age 67.  He is on polypharmacy with any antiepileptic drugs and has a vagal nerve stimulator.  Some of his generalized seizures were associated with apnea.    He has not experienced further episodes of blurred or altered vision.  He had seizures on three successive days on June 19, 2013, on June 20, 2013 and on June 21, 2013.  His [name] was opened and he received Ativan and phenobarbital on two successive days and Ativan on a third day.  I am not certain how may seizures there were in total, but it appeared that perhaps there were a total of five over three days.  Interestingly, he had no somatosensory seizures, which usually precede generalized tonic-clonic seizures.  His father's concerns today are that the patient at times seems distant, although he can get his attention.  Sometimes they have to prompt him to eat.  There are other times that he seems to be in his own world, rubbing his hands, and flailing his arms around.  The swelling in his face is stable.  The only prescription he filled today was potassium phosphate.  Overall his health has been good.  He is taking and tolerating his medications and has less somnolence, more stable gait, and seems to be more animated then I have seen him in quite sometime.  I interrogated his vagal nerve stimulator, which is described  below.  Procedure: Interrogation and reprogramming of Vagal Nerve Stimulator.   Implantation of the current stimulator 102 R on December 09, 2005 serial number [id number]   Interrogation of the vagal nerve stimulator: Voltage 2.25 mA. Frequency 20 Hz, pulse width 250 microseconds, signal time on 7 seconds, signal time off 30 seconds, magnet current 2.50 mA, signal time on 60 seconds, pulse width 250 microseconds. The normal mode diagnostics using his current settings was also normal, the DC-DC conversion code was 3. System diagnostics at 1 mV showed a DC-DC conversion of 0.  There is no indication to change the battery. He tolerated the procedure well.  Review of Systems: 12 system review was unremarkable  Past Medical History  Diagnosis Date  . ALLERGIC RHINITIS 12/26/2009  . MITRAL VALVE PROLAPSE 06/22/2007  . SEIZURE DISORDER 06/22/2007  . Unspecified hypothyroidism 06/22/2007  . Growth disorder   . Mental retardation, mild (I.Q. 50-70)   . Brown's syndrome   . Heart murmur     moderate MR  . Sleep apnea     cpap , last sleep study 10/01/11  . Hypokalemia   . Atrial fibrillation     During port removal and attempted placement in July 2013, transientt  . Hyperlipidemia     No therapy  . Blood clot in vein    Hospitalizations: yes, Head Injury: no, Nervous System Infections: no, Immunizations up to date: yes Past Medical History Comments:  Diagnosis of developmental delay as a toddler. He was a breech presentation, cesarean section delivery. He had intrauterine  growth retardation, and failure to thrive. EEG 11/07/86 was normal for gestational age. Genetics evaluation short stature, prenatal onset, chromosomal study: 25 XY, fragile X syndrome negative, evaluation for MELAS and MERRF were negative.  Initial seizure 08/28/94 left locomotor with secondary generalization  MRI of the brain 09/13/93: Scattered subcortical white matter lesions at the parietotemporal parieto-occipital junction is  ischemic versus hamartomas.  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 Tegretol 30 mcg for militate. 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  Topiramate started in May 1999 unable to be tolerated with Tegretol and was tapered and discontinued  January 2000 EEG showed right greater than left mid temporal sharp waves was otherwise well-organized EEG. He hospitalizations in March, July, October, in November for recurrent seizures.  Patient placed on Depakote in April 2000 and developed gagging abdominal pain headache and had significant change in transaminases and liver functions. Topamax is restarted and Keppra was added.  Vimpat was started July 16, 2012 and adjusted upwards.  [name] was added on November 18, 2012 and has been adjusted upwards. Topamax was changed to Trokendi XR in attempt to deal with sleepiness unsteadiness caused by polypharmacy. We to these medications have been introduced, the patient experiences somewhat improved seizure control however Is quite likely that despite better seizure control, the patient is experiencing impairment in cognition and gait as a result of polypharmacy. MRI of the brain on March 21, 2013. The patient has  extensive calcification to the basal ganglia bilaterally, normal ventricle size, and no acute findings. EKG was unchanged.   Behavior History none  Surgical History Past Surgical History  Procedure Laterality Date  . Implantation vagal nerve stimulator      Left  . Portacath placement      and removal 04/08/12  . Eye surgery      X2 for [facility] Syndrome  Ankle surgery on April 11, 2013, to improve a very tight heel cord.  Family History family history includes Asthma in his father; Breast cancer in his mother; Coronary artery disease in his maternal grandfather and paternal grandfather; Diabetes in his father; Lung cancer in his paternal grandfather; Pneumonia in his paternal grandmother; Stroke in his paternal grandmother. Family History is negative migraines, seizures, cognitive impairment, blindness, deafness, birth defects, chromosomal disorder, autism.  Social History History   Social History  . Marital Status: Single    Spouse Name: N/A    Number of Children: 0  . Years of Education: N/A   Occupational History  . Disabled    Social History Main Topics  . Smoking status: Never Smoker   . Smokeless tobacco: Never Used  . Alcohol Use: No  . Drug Use: No  . Sexual Activity: No   Other Topics Concern  . None   Social History Narrative  . None   Educational level 12th grade Occupation: N/A Living with  both parents  Hobbies/Interest: Sports on [employer] comments: [name] graduated from [employer] in 2012 with a certificate.  Current Outpatient Prescriptions on File Prior to Visit  Medication Sig Dispense Refill  . carbamazepine (TEGRETOL) 100 MG chewable tablet Chew 250 mg by mouth 3 (three) times daily.      . cetirizine (ZYRTEC) 10 MG tablet Take 10 mg by mouth daily.      . diazepam (VALIUM) 2 MG tablet TAKE ONE TABLET BY MOUTH AS NEEDED  20 tablet  5  . levETIRAcetam (KEPPRA) 500 MG tablet Take 1,500 mg by mouth 2 (two) times daily.      Marland Kitchen levothyroxine  (SYNTHROID, LEVOTHROID) 50 MCG tablet Take 50 mcg by mouth every morning.       Marland Kitchen LORazepam (ATIVAN) 2 MG/ML concentrated solution Inject 2 mg into the vein every 8 (eight) hours as needed. It would be given by IV      . Multiple Vitamin (MULTIVITAMIN WITH MINERALS) TABS Take 1 tablet by mouth daily.      . ondansetron (ZOFRAN ODT) 8 MG disintegrating tablet Take 1 tablet (8 mg total) by mouth every 8 (eight) hours as needed for nausea.  10 tablet  0  . [name] 10 MG tablet Take 5 mg by mouth 3 times daily  45 tablet  3  . phenobarbital (LUMINAL) 60 MG/ML injection Inject 65 mg into the vein daily as needed. For seizures      . potassium chloride SA (K-DUR,KLOR-CON) 20 MEQ tablet Take 20 mEq by mouth 4 (four) times daily.      . potassium phosphate, monobasic, (K-PHOS ORIGINAL) 500 MG tablet Take 500 mg by mouth 4 (four) times daily.      Marland Kitchen TROKENDI XR 200 MG CP24 Take 200 mg by mouth at bedtime.  31 capsule  5  . TROKENDI XR 50 MG CP24 Take 2 capsules by mouth at bedtime.  62 capsule  5  . VIMPAT 50 MG TABS tablet One in the morning, 1 at midday, and 2 at nighttime  124 tablet  5  . warfarin (COUMADIN) 5 MG tablet TAKE ONE & ONE-HALF TABLETS BY MOUTH DAILY ON MONDAY, WEDNESDAY AND FRIDAY AND TAKE ONE TABLET DAILY ON ALL OTHER DAYS  40 tablet  0   No current facility-administered medications on file prior to visit.   The medication list was reviewed and reconciled. All changes or newly prescribed medications were explained.  A complete medication list was provided to the patient/caregiver.  Allergies  Allergen Reactions  . Divalproex Sodium Other (See Comments)    Nausea, vomiting, liver and kidney dysfunction  . Phenytoin Rash    Hypersensitivity  reaction  . Vancomycin Rash    Redman's Syndrome   Physical Exam BP 84/60  Pulse 72  Ht 5' 2.5" (1.588 m)  Wt 112 lb 3.2 oz (50.894 kg)  BMI 20.18 kg/m2  General: thin young man, seated in wheelchair, in no distress, right-handed, sandy hair,  blue/brown eyes; Awake, alert, animated, talkative.  Head: head microcephalic and atraumatic, His head is slightly turned with the right ear toward his right shoulder and chin points the left. He does not have torticollis. Left face and neck are swollen.  Ears, Nose and Throat: beak-like nose. He has a low lying palate. Wax occluding seizures external auditory canals Neck: supple with no carotid or supraclavicular bruits.  Respiratory: Lungs are clear to auscultation  Cardiovascular: regular rate and rhythm, soft holosystolic murmur at left sternal border. His  nailbeds are pink.  Musculoskeletal: short stature with head tilted to the right. He has tight heel cords.  Skin: no rashes or lesions.  Trunk: no scoliosis. His portacath in the right upper chest. He has a vagal nerve stimulator in the left upper chest.   Neurologic Exam   Mental Status: Awake with mild lethargy. Oriented to place and time. Attention span, concentration, and fund of knowledge appropriate for him. He has evidence of mild cognitive impairment. Mood and affect appropriate. 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 Pupils equal, briskly reactive to light. Extraocular movements full but dysconjugate. Visual fields full to confrontation. Hearing intact and symmetric to whisper. Facial sensation intact. Face, tongue, palate move normally and symmetrically. Neck flexion and extension normal. 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 He had a slight tic-like movement of his mouth. Motor: Diminished bulk and normal tone. The patient has mild weakness in his deltoids and biceps in the 4+ over 5 range.He had normal strength in his legs. He has clumsy fine motor movements.  Sensory: Intact to light touch,cold, vibration, and proprioception in all extremities; good stereognosis  Coordination: Rapid alternating  movements are clumsy in all extremities. Finger-to-nose and heel-to-shin performed accurately bilaterally. Romberg negative.  Gait and Station: Right foot is in a somewhat valgus position, but the heel strike for both feet is good.  His gait is slightly broad-based.  Reflexes: Absent and symmetric. Toes downgoing.  Assessment 1. 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 2. 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 3. Gait abnormality stable (781.2). 4. 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 5. Hypokalemia (276.8).  Plan I am not going to make changes in his medication at this time.  He will continue on Vimpat, Trokendi, [name], levetiracetam, and carbamazepine.  I would like to simplify his regimen, but each time we try to remove the medication, he seems to have more seizures.  His vagal nerve stimulator, which was implanted in 2007 continues to work well.  I spent 30 minutes of face-to-face time with the patient and his father, more than half of it in consultation and interrogated and reprogramed the vagal nerve stimulator.  He will return in three months' time, sooner depending upon clinical need.  Deetta Perla MD

## 2013-07-12 ENCOUNTER — Ambulatory Visit (INDEPENDENT_AMBULATORY_CARE_PROVIDER_SITE_OTHER): Payer: Medicaid Other | Admitting: Pharmacist

## 2013-07-12 DIAGNOSIS — I82629 Acute embolism and thrombosis of deep veins of unspecified upper extremity: Secondary | ICD-10-CM

## 2013-07-12 DIAGNOSIS — Z7901 Long term (current) use of anticoagulants: Secondary | ICD-10-CM

## 2013-07-20 ENCOUNTER — Other Ambulatory Visit: Payer: Self-pay

## 2013-07-20 MED ORDER — CARBAMAZEPINE 100 MG PO CHEW: 250.0000 mg | CHEWABLE_TABLET | Freq: Three times a day (TID) | ORAL | Status: DC

## 2013-08-02 ENCOUNTER — Observation Stay (HOSPITAL_COMMUNITY)
Admission: EM | Admit: 2013-08-02 | Discharge: 2013-08-04 | Disposition: A | Discharge status: Home / Self Care | Payer: Medicaid Other | Attending: Internal Medicine | Admitting: Internal Medicine

## 2013-08-02 ENCOUNTER — Emergency Department (HOSPITAL_COMMUNITY): Payer: Medicaid Other

## 2013-08-02 ENCOUNTER — Encounter (HOSPITAL_COMMUNITY): Payer: Self-pay | Admitting: Emergency Medicine

## 2013-08-02 DIAGNOSIS — J690 Pneumonitis due to inhalation of food and vomit: Secondary | ICD-10-CM

## 2013-08-02 DIAGNOSIS — E876 Hypokalemia: Secondary | ICD-10-CM

## 2013-08-02 DIAGNOSIS — I82B19 Acute embolism and thrombosis of unspecified subclavian vein: Secondary | ICD-10-CM

## 2013-08-02 DIAGNOSIS — S0993XA Unspecified injury of face, initial encounter: Secondary | ICD-10-CM

## 2013-08-02 DIAGNOSIS — M79609 Pain in unspecified limb: Secondary | ICD-10-CM

## 2013-08-02 DIAGNOSIS — T82598A Other mechanical complication of other cardiac and vascular devices and implants, initial encounter: Secondary | ICD-10-CM

## 2013-08-02 DIAGNOSIS — R634 Abnormal weight loss: Secondary | ICD-10-CM

## 2013-08-02 DIAGNOSIS — Z79899 Other long term (current) drug therapy: Secondary | ICD-10-CM

## 2013-08-02 DIAGNOSIS — W19XXXA Unspecified fall, initial encounter: Secondary | ICD-10-CM | POA: Insufficient documentation

## 2013-08-02 DIAGNOSIS — J309 Allergic rhinitis, unspecified: Secondary | ICD-10-CM

## 2013-08-02 DIAGNOSIS — G4733 Obstructive sleep apnea (adult) (pediatric): Secondary | ICD-10-CM

## 2013-08-02 DIAGNOSIS — S0003XA Contusion of scalp, initial encounter: Secondary | ICD-10-CM | POA: Insufficient documentation

## 2013-08-02 DIAGNOSIS — M216X9 Other acquired deformities of unspecified foot: Secondary | ICD-10-CM

## 2013-08-02 DIAGNOSIS — I059 Rheumatic mitral valve disease, unspecified: Secondary | ICD-10-CM

## 2013-08-02 DIAGNOSIS — G40901 Epilepsy, unspecified, not intractable, with status epilepticus: Secondary | ICD-10-CM

## 2013-08-02 DIAGNOSIS — R6250 Unspecified lack of expected normal physiological development in childhood: Secondary | ICD-10-CM | POA: Insufficient documentation

## 2013-08-02 DIAGNOSIS — H5060 Mechanical strabismus, unspecified: Secondary | ICD-10-CM | POA: Diagnosis present

## 2013-08-02 DIAGNOSIS — G40319 Generalized idiopathic epilepsy and epileptic syndromes, intractable, without status epilepticus: Secondary | ICD-10-CM

## 2013-08-02 DIAGNOSIS — G40209 Localization-related (focal) (partial) symptomatic epilepsy and epileptic syndromes with complex partial seizures, not intractable, without status epilepticus: Secondary | ICD-10-CM

## 2013-08-02 DIAGNOSIS — Y92009 Unspecified place in unspecified non-institutional (private) residence as the place of occurrence of the external cause: Secondary | ICD-10-CM | POA: Insufficient documentation

## 2013-08-02 DIAGNOSIS — F70 Mild intellectual disabilities: Secondary | ICD-10-CM | POA: Insufficient documentation

## 2013-08-02 DIAGNOSIS — R569 Unspecified convulsions: Secondary | ICD-10-CM

## 2013-08-02 DIAGNOSIS — Y998 Other external cause status: Secondary | ICD-10-CM | POA: Insufficient documentation

## 2013-08-02 DIAGNOSIS — I499 Cardiac arrhythmia, unspecified: Secondary | ICD-10-CM

## 2013-08-02 DIAGNOSIS — E039 Hypothyroidism, unspecified: Secondary | ICD-10-CM

## 2013-08-02 DIAGNOSIS — R197 Diarrhea, unspecified: Secondary | ICD-10-CM

## 2013-08-02 DIAGNOSIS — Z7901 Long term (current) use of anticoagulants: Secondary | ICD-10-CM

## 2013-08-02 DIAGNOSIS — Z23 Encounter for immunization: Secondary | ICD-10-CM | POA: Insufficient documentation

## 2013-08-02 DIAGNOSIS — I82629 Acute embolism and thrombosis of deep veins of unspecified upper extremity: Secondary | ICD-10-CM | POA: Diagnosis present

## 2013-08-02 DIAGNOSIS — I340 Nonrheumatic mitral (valve) insufficiency: Secondary | ICD-10-CM

## 2013-08-02 DIAGNOSIS — G40309 Generalized idiopathic epilepsy and epileptic syndromes, not intractable, without status epilepticus: Secondary | ICD-10-CM

## 2013-08-02 DIAGNOSIS — Z86711 Personal history of pulmonary embolism: Secondary | ICD-10-CM | POA: Insufficient documentation

## 2013-08-02 DIAGNOSIS — R791 Abnormal coagulation profile: Secondary | ICD-10-CM

## 2013-08-02 DIAGNOSIS — I4891 Unspecified atrial fibrillation: Secondary | ICD-10-CM | POA: Diagnosis present

## 2013-08-02 DIAGNOSIS — G40909 Epilepsy, unspecified, not intractable, without status epilepticus: Principal | ICD-10-CM | POA: Diagnosis present

## 2013-08-02 DIAGNOSIS — Z86718 Personal history of other venous thrombosis and embolism: Secondary | ICD-10-CM | POA: Insufficient documentation

## 2013-08-02 DIAGNOSIS — G40109 Localization-related (focal) (partial) symptomatic epilepsy and epileptic syndromes with simple partial seizures, not intractable, without status epilepticus: Secondary | ICD-10-CM

## 2013-08-02 DIAGNOSIS — G40119 Localization-related (focal) (partial) symptomatic epilepsy and epileptic syndromes with simple partial seizures, intractable, without status epilepticus: Secondary | ICD-10-CM

## 2013-08-02 DIAGNOSIS — R269 Unspecified abnormalities of gait and mobility: Secondary | ICD-10-CM

## 2013-08-02 DIAGNOSIS — Z13220 Encounter for screening for lipoid disorders: Secondary | ICD-10-CM

## 2013-08-02 LAB — CBC WITH DIFFERENTIAL/PLATELET
Basophils Absolute: 0 10*3/uL (ref 0.0–0.1)
Basophils Relative: 0 % (ref 0–1)
Eosinophils Absolute: 0.1 10*3/uL (ref 0.0–0.7)
Eosinophils Relative: 1 % (ref 0–5)
Hemoglobin: 12.4 g/dL — ABNORMAL LOW (ref 13.0–17.0)
MCH: 29.3 pg (ref 26.0–34.0)
MCHC: 34.1 g/dL (ref 30.0–36.0)
MCV: 86.1 fL (ref 78.0–100.0)
Neutro Abs: 2.8 10*3/uL (ref 1.7–7.7)
Platelets: 198 10*3/uL (ref 150–400)
RDW: 13.3 % (ref 11.5–15.5)
WBC: 4.4 10*3/uL (ref 4.0–10.5)

## 2013-08-02 LAB — BASIC METABOLIC PANEL
BUN: 6 mg/dL (ref 6–23)
Calcium: 8.5 mg/dL (ref 8.4–10.5)
Chloride: 106 mEq/L (ref 96–112)
Creatinine, Ser: 0.71 mg/dL (ref 0.50–1.35)
GFR calc Af Amer: >90 mL/min (ref >90)
GFR calc non Af Amer: >90 mL/min (ref >90)
Glucose, Bld: 92 mg/dL (ref 70–99)
Sodium: 137 mEq/L (ref 135–145)

## 2013-08-02 LAB — GLUCOSE, CAPILLARY: Glucose-Capillary: 78 mg/dL (ref 70–99)

## 2013-08-02 MED ORDER — POTASSIUM PHOSPHATE MONOBASIC 500 MG PO TABS
500.0000 mg | ORAL_TABLET | Freq: Four times a day (QID) | ORAL | Status: DC
  Administered 2013-08-02 – 2013-08-04 (×7): 500 mg via ORAL
  Filled 2013-08-02 (×10): qty 1

## 2013-08-02 MED ORDER — TOPIRAMATE ER 200 MG PO CAP24: 300.0000 mg | ORAL_CAPSULE | Freq: Every day | ORAL | Status: DC

## 2013-08-02 MED ORDER — ATORVASTATIN CALCIUM 10 MG PO TABS
10.0000 mg | ORAL_TABLET | Freq: Every day | ORAL | Status: DC
  Administered 2013-08-02 – 2013-08-03 (×2): 10 mg via ORAL
  Filled 2013-08-02 (×3): qty 1

## 2013-08-02 MED ORDER — POTASSIUM CHLORIDE CRYS ER 20 MEQ PO TBCR
20.0000 meq | EXTENDED_RELEASE_TABLET | Freq: Four times a day (QID) | ORAL | Status: DC
  Administered 2013-08-02 – 2013-08-04 (×7): 20 meq via ORAL
  Filled 2013-08-02 (×10): qty 1

## 2013-08-02 MED ORDER — LACOSAMIDE 50 MG PO TABS: 50.0000 mg | ORAL_TABLET | Freq: Three times a day (TID) | ORAL | Status: DC

## 2013-08-02 MED ORDER — DIAZEPAM 2 MG PO TABS: 2.0000 mg | ORAL_TABLET | Freq: Four times a day (QID) | ORAL | Status: DC | PRN

## 2013-08-02 MED ORDER — LORAZEPAM 2 MG/ML PO CONC: 2.0000 mg | Freq: Three times a day (TID) | ORAL | Status: DC | PRN

## 2013-08-02 MED ORDER — LACOSAMIDE 50 MG PO TABS
50.0000 mg | ORAL_TABLET | Freq: Two times a day (BID) | ORAL | Status: DC
  Administered 2013-08-03 – 2013-08-04 (×3): 50 mg via ORAL
  Filled 2013-08-02 (×3): qty 1

## 2013-08-02 MED ORDER — LORATADINE 10 MG PO TABS
10.0000 mg | ORAL_TABLET | Freq: Every day | ORAL | Status: DC
  Administered 2013-08-03 – 2013-08-04 (×2): 10 mg via ORAL
  Filled 2013-08-02 (×3): qty 1

## 2013-08-02 MED ORDER — SODIUM CHLORIDE 0.9 % IJ SOLN
3.0000 mL | Freq: Two times a day (BID) | INTRAMUSCULAR | Status: DC
  Administered 2013-08-03 (×2): 3 mL via INTRAVENOUS

## 2013-08-02 MED ORDER — AMOXICILLIN 500 MG PO CAPS
500.0000 mg | ORAL_CAPSULE | Freq: Every evening | ORAL | Status: DC
  Administered 2013-08-02 – 2013-08-03 (×3): 500 mg via ORAL
  Filled 2013-08-02 (×3): qty 1

## 2013-08-02 MED ORDER — LEVETIRACETAM 750 MG PO TABS
1500.0000 mg | ORAL_TABLET | Freq: Two times a day (BID) | ORAL | Status: DC
  Administered 2013-08-02 – 2013-08-04 (×4): 1500 mg via ORAL
  Filled 2013-08-02 (×5): qty 2

## 2013-08-02 MED ORDER — POTASSIUM CHLORIDE CRYS ER 20 MEQ PO TBCR: 20.0000 meq | EXTENDED_RELEASE_TABLET | Freq: Four times a day (QID) | ORAL | Status: DC

## 2013-08-02 MED ORDER — TOPIRAMATE ER 200 MG PO CAP24
200.0000 mg | ORAL_CAPSULE | Freq: Every day | ORAL | Status: DC
  Administered 2013-08-02 – 2013-08-03 (×2): 200 mg via ORAL
  Filled 2013-08-02: qty 1

## 2013-08-02 MED ORDER — TOPIRAMATE ER 200 MG PO CAP24: 200.0000 mg | ORAL_CAPSULE | Freq: Every day | ORAL | Status: DC

## 2013-08-02 MED ORDER — POTASSIUM PHOSPHATE MONOBASIC 500 MG PO TABS
500.0000 mg | ORAL_TABLET | Freq: Four times a day (QID) | ORAL | Status: DC
  Filled 2013-08-02 (×2): qty 1

## 2013-08-02 MED ORDER — TOPIRAMATE ER 50 MG PO CAP24: 2.0000 | ORAL_CAPSULE | Freq: Every day | ORAL | Status: DC

## 2013-08-02 MED ORDER — AMOXICILLIN 875 MG PO TABS: 875.0000 mg | ORAL_TABLET | Freq: Two times a day (BID) | ORAL | Status: DC

## 2013-08-02 MED ORDER — GUAIFENESIN-CODEINE 100-10 MG/5ML PO SOLN
5.0000 mL | Freq: Four times a day (QID) | ORAL | Status: DC | PRN
  Administered 2013-08-03 – 2013-08-04 (×3): 5 mL via ORAL
  Filled 2013-08-02 (×5): qty 5

## 2013-08-02 MED ORDER — LACOSAMIDE 200 MG PO TABS
100.0000 mg | ORAL_TABLET | Freq: Every evening | ORAL | Status: DC
  Administered 2013-08-02 – 2013-08-03 (×2): 100 mg via ORAL
  Filled 2013-08-02 (×3): qty 1

## 2013-08-02 MED ORDER — TOPIRAMATE ER 50 MG PO CAP24
100.0000 mg | ORAL_CAPSULE | Freq: Every day | ORAL | Status: DC
  Administered 2013-08-02 – 2013-08-03 (×2): 100 mg via ORAL
  Filled 2013-08-02: qty 2

## 2013-08-02 MED ORDER — CARBAMAZEPINE 100 MG PO CHEW
250.0000 mg | CHEWABLE_TABLET | Freq: Three times a day (TID) | ORAL | Status: DC
  Administered 2013-08-02 – 2013-08-04 (×5): 250 mg via ORAL
  Filled 2013-08-02 (×8): qty 2.5

## 2013-08-02 MED ORDER — SODIUM CHLORIDE 0.9 % IJ SOLN
3.0000 mL | Freq: Two times a day (BID) | INTRAMUSCULAR | Status: DC
  Administered 2013-08-02 – 2013-08-04 (×3): 3 mL via INTRAVENOUS

## 2013-08-02 MED ORDER — SODIUM CHLORIDE 0.9 % IV SOLN: 250.0000 mL | INTRAVENOUS | Status: DC | PRN

## 2013-08-02 MED ORDER — ADULT MULTIVITAMIN W/MINERALS CH
1.0000 | ORAL_TABLET | Freq: Every day | ORAL | Status: DC
  Administered 2013-08-02 – 2013-08-04 (×3): 1 via ORAL
  Filled 2013-08-02 (×3): qty 1

## 2013-08-02 MED ORDER — SODIUM CHLORIDE 0.9 % IJ SOLN: 3.0000 mL | INTRAMUSCULAR | Status: DC | PRN

## 2013-08-02 MED ORDER — ONDANSETRON 4 MG PO TBDP: 8.0000 mg | ORAL_TABLET | Freq: Three times a day (TID) | ORAL | Status: DC | PRN

## 2013-08-02 MED ORDER — AMOXICILLIN 500 MG PO CAPS
1000.0000 mg | ORAL_CAPSULE | Freq: Every morning | ORAL | Status: DC
  Administered 2013-08-03 – 2013-08-04 (×2): 1000 mg via ORAL
  Filled 2013-08-02 (×2): qty 2

## 2013-08-02 MED ORDER — LEVOTHYROXINE SODIUM 50 MCG PO TABS
50.0000 ug | ORAL_TABLET | Freq: Every day | ORAL | Status: DC
  Administered 2013-08-03 – 2013-08-04 (×2): 50 ug via ORAL
  Filled 2013-08-02 (×3): qty 1

## 2013-08-02 MED ORDER — WARFARIN - PHARMACIST DOSING INPATIENT: Freq: Every day | Status: DC

## 2013-08-02 MED ORDER — PHENOBARBITAL SODIUM 65 MG/ML IJ SOLN: 65.0000 mg | Freq: Every day | INTRAMUSCULAR | Status: DC | PRN

## 2013-08-02 MED ORDER — LORAZEPAM 2 MG/ML IJ SOLN: 2.0000 mg | Freq: Three times a day (TID) | INTRAMUSCULAR | Status: DC | PRN

## 2013-08-02 MED ORDER — WARFARIN SODIUM 7.5 MG PO TABS
7.5000 mg | ORAL_TABLET | Freq: Once | ORAL | Status: AC
  Administered 2013-08-02: 7.5 mg via ORAL
  Filled 2013-08-02: qty 1

## 2013-08-02 MED ORDER — CLOBAZAM 10 MG PO TABS
5.0000 mg | ORAL_TABLET | Freq: Three times a day (TID) | ORAL | Status: DC
  Administered 2013-08-02: 5 mg via ORAL

## 2013-08-02 NOTE — ED Notes (Signed)
Pt transported to unit on cardiac monitor. 

## 2013-08-02 NOTE — ED Provider Notes (Signed)
Medical screening examination/treatment/procedure(s) were conducted as a shared visit with non-physician practitioner(s) and myself.  I personally evaluated the patient during the encounter.   Please see my separate note.     Candyce Churn, MD 08/02/13 [phone]

## 2013-08-02 NOTE — ED Notes (Signed)
Pharmacy tech at bedside 

## 2013-08-02 NOTE — ED Notes (Addendum)
Mother with pt, reports he had a sensory seizure yesterday and then a few more today, then had 2 grandmal seizures. Pt was able to tell his mother that he felt like he was about to have a seizure, so mother administered ativan then pt fell and landed on his nose. Family is able to access the pt's port at home to administered meds. Here for evaluation of face from falling from the seizure. Prior to this his last grandmal seizure was on 07/17/13. Pt has obvious bruising and swelling to left check and nose. Pt is taking coumadin. Bleeding under control now. Dried blood seen around pt's nose. Pt saw physician yesterday for feeling sick with nasal congestion and given new medications to take. Mother reports this is normal for him to have seizures when he gets sick.

## 2013-08-02 NOTE — ED Provider Notes (Signed)
CSN: [id number]     Arrival date & time 08/02/13  1315 History   First MD Initiated Contact with Patient 08/02/13 1337     Chief Complaint  Patient presents with  . 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 His first seizure was at 9 AM. His second seizure was at 10:30 AM. Between the 2 seizures he was having sensory seizures. The seizures were both grand mal seizures. His mother is his caregiver. She gave him both Ativan and phenobarbital. His mother has given him the max dose of seizure medications that she can at home. She reports she was postictal for approximately 7 minutes after his seizure. He has been sick recently which she believes is causing an increase in the number of seizures. He has been compliant with taking his seizure medication as prescribed. She brought him in today because he hit his head on the hardwood floor. He has bruising on his face. He is on anticoagulation for prior pulmonary embolism. He is due to have his INR checked this week. Generally his INR is consistent and runs between 2.0 and 2.5. He sees Dr. Cyndia Bent and was seen by Dr. Sharene Skeans yesterday. He was prescribed amoxicillin for a URI.   The history is provided by the patient and the spouse. No language interpreter was used.    Past Medical History  Diagnosis Date  . ALLERGIC RHINITIS 12/26/2009  . MITRAL VALVE PROLAPSE 06/22/2007  . SEIZURE DISORDER 06/22/2007  . Unspecified hypothyroidism 06/22/2007  . Growth disorder   . Mental retardation, mild (I.Q. 50-70)   . Brown's syndrome   . Heart murmur     moderate MR  . Sleep apnea     cpap , last sleep study 10/01/11  . Hypokalemia   . Atrial fibrillation     During  port removal and attempted placement in July 2013, transientt  . Hyperlipidemia     No therapy  . Blood clot in vein    Past Surgical History  Procedure Laterality Date  . Implantation vagal nerve stimulator      Left  . Portacath placement      and removal 04/08/12  . Eye surgery      X2 for Brown Syndrome   Family History  Problem Relation Age of Onset  . Breast cancer Mother   . Asthma Father   . Diabetes Father   . Coronary artery disease Maternal Grandfather   . Coronary artery disease Paternal Grandfather   . Lung cancer Paternal Grandfather     Died at 50  . Pneumonia Paternal Grandmother     Died at 53  . Stroke Paternal Grandmother    History  Substance Use Topics  . Smoking status: Never Smoker   . Smokeless tobacco: Never Used  . Alcohol Use: No    Review of Systems  Respiratory: Positive for shortness of breath and wheezing.   Neurological: Positive for seizures.  All other systems reviewed and are negative.    Allergies  Divalproex sodium; Phenytoin; and Vancomycin  Home Medications   Current Outpatient Rx  Name  Route  Sig  Dispense  Refill  . carbamazepine (TEGRETOL) 100 MG chewable tablet   Oral   Chew 2.5 tablets (250 mg total) by mouth 3 (three) times daily.   233 tablet   5   .  cetirizine (ZYRTEC) 10 MG tablet   Oral   Take 10 mg by mouth daily.         . diazepam (VALIUM) 2 MG tablet      TAKE ONE TABLET BY MOUTH AS NEEDED   20 tablet   5   . levETIRAcetam (KEPPRA) 500 MG tablet   Oral   Take 1,500 mg by mouth 2 (two) times daily.         Marland Kitchen levothyroxine (SYNTHROID, LEVOTHROID) 50 MCG tablet   Oral   Take 50 mcg by mouth every morning.          Marland Kitchen LORazepam (ATIVAN) 2 MG/ML concentrated solution   Intravenous   Inject 2 mg into the vein every 8 (eight) hours as needed. It would be given by IV         . Multiple Vitamin (MULTIVITAMIN WITH MINERALS) TABS   Oral   Take 1 tablet by mouth daily.         .  ondansetron (ZOFRAN ODT) 8 MG disintegrating tablet   Oral   Take 1 tablet (8 mg total) by mouth every 8 (eight) hours as needed for nausea.   10 tablet   0   . [name] 10 MG tablet      Take 5 mg by mouth 3 times daily   45 tablet   3     Dispense as written.    Brand Name Medically Necessary   . phenobarbital (LUMINAL) 60 MG/ML injection   Intravenous   Inject 65 mg into the vein daily as needed. For seizures         . potassium chloride SA (K-DUR,KLOR-CON) 20 MEQ tablet   Oral   Take 20 mEq by mouth 4 (four) times daily.         . potassium phosphate, monobasic, (K-PHOS ORIGINAL) 500 MG tablet   Oral   Take 1 tablet (500 mg total) by mouth 4 (four) times daily.   124 tablet   5   . TROKENDI XR 200 MG CP24   Oral   Take 200 mg by mouth at bedtime.   31 capsule   5     Dispense as written.   Marland Kitchen TROKENDI XR 50 MG CP24   Oral   Take 2 capsules by mouth at bedtime.   62 capsule   5     Dispense as written.   Marland Kitchen VIMPAT 50 MG TABS tablet      One in the morning, 1 at midday, and 2 at nighttime   124 tablet   5     Dispense as written.   . warfarin (COUMADIN) 5 MG tablet      TAKE ONE & ONE-HALF TABLETS BY MOUTH DAILY ON MONDAY, WEDNESDAY AND FRIDAY AND TAKE ONE TABLET DAILY ON ALL OTHER DAYS   40 tablet   0    BP 102/64  Pulse 83  Temp(Src) 97.6 F (36.4 C) (Oral)  Resp 20  Ht 5\' 3"  (1.6 m)  Wt 111 lb (50.349 kg)  BMI 19.67 kg/m2  SpO2 100% Physical Exam  Nursing note and vitals reviewed. Constitutional: He is oriented to person, place, and time. He appears well-developed and well-nourished. No distress.  HENT:  Head: Atraumatic.    Right Ear: External ear normal.  Left Ear: External ear normal.  Nose: Nose normal.  Mouth/Throat: Uvula is midline and mucous membranes are normal.  Small cut to left inner cheek. There is some blood in his mouth.  Eyes: Conjunctivae, EOM and lids are normal. Pupils are equal, round, and reactive to light.    EOMs normal. No evidence of entrapment  Neck: Normal range of motion. No tracheal deviation present.  Cardiovascular: Normal rate, regular rhythm and normal heart sounds.   Pulmonary/Chest: Effort normal and breath sounds normal. No stridor.  Abdominal: Soft. He exhibits no distension. There is no tenderness. There is no rigidity and no guarding.  Musculoskeletal: Normal range of motion.  Neurological: He is alert and oriented to person, place, and time.  Skin: Skin is warm and dry. He is not diaphoretic.  Psychiatric: He has a normal mood and affect. His behavior is normal.    ED Course  Procedures (including critical care time) Labs Review Labs Reviewed  PROTIME-INR - Abnormal; Notable for the following:    Prothrombin Time 21.0 (*)    INR 1.87 (*)    All other components within normal limits  CBC WITH DIFFERENTIAL - Abnormal; Notable for the following:    Hemoglobin 12.4 (*)    HCT 36.4 (*)    All other components within normal limits  GLUCOSE, CAPILLARY  BASIC METABOLIC PANEL  LEVETIRACETAM LEVEL   Imaging Review Dg Chest 2 View  08/02/2013   CLINICAL DATA:  Seizures  EXAM: CHEST  2 VIEW  COMPARISON:  Prior chest x-ray 02/27/2013  FINDINGS: Stable position of right subclavian approach single-lumen port catheter. Catheter tip projects the superior cavoatrial junction. Left cervical neural stimulator also in unchanged position. Cardiac and mediastinal contours are unchanged. Inspiratory volumes are lower and there is mild bibasilar right-greater-than-left atelectasis. No acute osseous abnormality. No pneumothorax or pleural effusion.  IMPRESSION: Low inspiratory volumes with mild right worse than left bibasilar atelectasis. Given the history of recent multiple seizures, trace aspiration in the right base would be difficult to exclude.  Otherwise, stable chest x-ray without acute abnormality.   Electronically Signed   By: Malachy Moan M.D.   On: 08/02/2013 15:00   Ct Head Wo  Contrast  08/02/2013   CLINICAL DATA:  Seizure. Fall from chair.  EXAM: CT HEAD WITHOUT CONTRAST  CT MAXILLOFACIAL WITHOUT CONTRAST  CT CERVICAL SPINE WITHOUT CONTRAST  TECHNIQUE: Multidetector CT imaging of the head, cervical spine, and maxillofacial structures were performed using the standard protocol without intravenous contrast. Multiplanar CT image reconstructions of the cervical spine and maxillofacial structures were also generated.  COMPARISON:  05/12/2013 head CT. 02/22/2013 brain MR. 09/01/2011 CT of the head, cervical spine and face.  FINDINGS: CT HEAD FINDINGS  No skull fracture or intracranial hemorrhage.  Prominent basal ganglia calcifications unchanged (described with metabolic abnormalities).  No CT evidence of large acute infarct.  Cerebellar tonsils minimally low lying but within the range of normal limits.  No intracranial mass lesion noted on this unenhanced exam.  Prominent paranasal sinus opacification with air-fluid level left maxillary sinus.  Partial opacification mastoid air cells without evidence of fracture.  CT MAXILLOFACIAL FINDINGS  Paranasal sinus mucosal thickening. No facial fracture noted.  CT CERVICAL SPINE FINDINGS  Motion degraded examination. No cervical spine fracture detected.  Scoliosis and kyphosis cervical spine.  Stimulating device left neck.  IMPRESSION: No skull fracture or intracranial hemorrhage.  Paranasal sinus mucosal thickening.  No facial fracture noted.  Motion degraded examination. No cervical spine fracture detected.  Scoliosis and kyphosis cervical spine.  Stimulating device left neck.   Electronically Signed   By: Bridgett Larsson M.D.   On: 08/02/2013 15:32   Ct Cervical Spine Wo Contrast  08/02/2013   CLINICAL DATA:  Seizure. Fall from chair.  EXAM: CT HEAD WITHOUT CONTRAST  CT MAXILLOFACIAL WITHOUT CONTRAST  CT CERVICAL SPINE WITHOUT CONTRAST  TECHNIQUE: Multidetector CT imaging of the head, cervical spine, and maxillofacial structures were performed  using the standard protocol without intravenous contrast. Multiplanar CT image reconstructions of the cervical spine and maxillofacial structures were also generated.  COMPARISON:  05/12/2013 head CT. 02/22/2013 brain MR. 09/01/2011 CT of the head, cervical spine and face.  FINDINGS: CT HEAD FINDINGS  No skull fracture or intracranial hemorrhage.  Prominent basal ganglia calcifications unchanged (described with metabolic abnormalities).  No CT evidence of large acute infarct.  Cerebellar tonsils minimally low lying but within the range of normal limits.  No intracranial mass lesion noted on this unenhanced exam.  Prominent paranasal sinus opacification with air-fluid level left maxillary sinus.  Partial opacification mastoid air cells without evidence of fracture.  CT MAXILLOFACIAL FINDINGS  Paranasal sinus mucosal thickening. No facial fracture noted.  CT CERVICAL SPINE FINDINGS  Motion degraded examination. No cervical spine fracture detected.  Scoliosis and kyphosis cervical spine.  Stimulating device left neck.  IMPRESSION: No skull fracture or intracranial hemorrhage.  Paranasal sinus mucosal thickening.  No facial fracture noted.  Motion degraded examination. No cervical spine fracture detected.  Scoliosis and kyphosis cervical spine.  Stimulating device left neck.   Electronically Signed   By: Bridgett Larsson M.D.   On: 08/02/2013 15:32   Ct Maxillofacial Wo Cm  08/02/2013   CLINICAL DATA:  Seizure. Fall from chair.  EXAM: CT HEAD WITHOUT CONTRAST  CT MAXILLOFACIAL WITHOUT CONTRAST  CT CERVICAL SPINE WITHOUT CONTRAST  TECHNIQUE: Multidetector CT imaging of the head, cervical spine, and maxillofacial structures were performed using the standard protocol without intravenous contrast. Multiplanar CT image reconstructions of the cervical spine and maxillofacial structures were also generated.  COMPARISON:  05/12/2013 head CT. 02/22/2013 brain MR. 09/01/2011 CT of the head, cervical spine and face.  FINDINGS: CT  HEAD FINDINGS  No skull fracture or intracranial hemorrhage.  Prominent basal ganglia calcifications unchanged (described with metabolic abnormalities).  No CT evidence of large acute infarct.  Cerebellar tonsils minimally low lying but within the range of normal limits.  No intracranial mass lesion noted on this unenhanced exam.  Prominent paranasal sinus opacification with air-fluid level left maxillary sinus.  Partial opacification mastoid air cells without evidence of fracture.  CT MAXILLOFACIAL FINDINGS  Paranasal sinus mucosal thickening. No facial fracture noted.  CT CERVICAL SPINE FINDINGS  Motion degraded examination. No cervical spine fracture detected.  Scoliosis and kyphosis cervical spine.  Stimulating device left neck.  IMPRESSION: No skull fracture or intracranial hemorrhage.  Paranasal sinus mucosal thickening.  No facial fracture noted.  Motion degraded examination. No cervical spine fracture detected.  Scoliosis and kyphosis cervical spine.  Stimulating device left neck.   Electronically Signed   By: Bridgett Larsson M.D.   On: 08/02/2013 15:32    EKG Interpretation   None      3:56 PM discussed case with Dr. Sharene Skeans. Admit to hospitalist for observation. He will consult on patient. I will give his number to admitting doc. [id number].   MDM   1. Seizures   2. Facial trauma, initial encounter    Patient presents with seizures. He has received max dose of her own medications. He hit his head and is anticoagulated. CT head, face, spine are negative for acute process. Facial fracture, cervical spine fracture, intracranial hemorrhage. Discussed case with his neurologist  Dr. Sharene Skeans. Will admit for observation. Admission appreciated. Vital signs stable. Patient / Family / Caregiver informed of clinical course, understand medical decision-making process, and agree with plan.     Mora Bellman, PA-C 08/02/13 [phone]

## 2013-08-02 NOTE — ED Provider Notes (Signed)
Medical screening examination/treatment/procedure(s) were conducted as a shared visit with non-physician practitioner(s) and myself.  I personally evaluated the patient during the encounter.  EKG Interpretation   None       26 yo male with hx of developmental delay and seizures, who takes coumadin for atrial fibrillation presenting with multiple seizures today, one of which resulted in head trauma.  Alert and not seizing on my exam.  Mild bruising to left cheek area.  Pt's neurologist consulted.  Plan admit for seizure and head injury monitoring.    Clinical Impression: 1. Seizures   2. Facial trauma, initial encounter       Candyce Churn, MD 08/02/13 [phone]

## 2013-08-02 NOTE — ED Notes (Signed)
Pt is on coumadin

## 2013-08-02 NOTE — ED Notes (Signed)
Floor nurse unable to take report, nurse to call back.

## 2013-08-02 NOTE — Consult Note (Signed)
NEURO HOSPITALIST CONSULT NOTE    Reason for Consult: worsening seizures.  HPI:                                                                                                                                          [name] is an 27 y.o. 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 Patient' father is at the bedside and tells me that [name] has been having an increase in the frequency of his typical " sensory and hard seizures" and earlier today had couple of seizures at home despite receiving ativan and phenobarbital. As she was obtaining the phenobarbital for his second seizure, patient continued to seize and actually was found on the floor.  Recently started on amoxicillin for possible upper respiratory infection but seizures started even before initiating amoxicillin. Otherwise no other medical issues or changes in his anticonvulsants. CT brain today showed no acute intracranial abnormality. At this moment, he is alert and awake and following commands.    Past Medical History  Diagnosis Date  . ALLERGIC RHINITIS 12/26/2009  . MITRAL VALVE PROLAPSE 06/22/2007  . SEIZURE DISORDER 06/22/2007  . Unspecified hypothyroidism 06/22/2007  . Growth disorder   . Mental retardation, mild (I.Q. 50-70)   . Brown's syndrome   . Heart murmur     moderate MR  . Sleep apnea     cpap , last sleep study 10/01/11  . Hypokalemia   . Atrial fibrillation     During port removal and attempted placement in July 2013, transientt  . Hyperlipidemia     No therapy  . Blood clot in vein     Past Surgical History  Procedure Laterality Date  . Implantation vagal nerve stimulator      Left  . Portacath placement      and removal 04/08/12  . Eye surgery      X2  for Brown Syndrome    Family History  Problem Relation Age of Onset  . Breast cancer Mother   . Asthma Father   . Diabetes Father   . Coronary artery disease Maternal Grandfather   . Coronary artery disease Paternal Grandfather   . Lung cancer Paternal Grandfather     Died at 75  . Pneumonia Paternal Grandmother     Died at 34  . Stroke Paternal Grandmother     Social History:  reports that he has never smoked. He has never used smokeless tobacco. He reports that he does not drink alcohol or use illicit drugs.  Allergies  Allergen Reactions  . Divalproex Sodium Other (See Comments)  Nausea, vomiting, liver and kidney dysfunction  . Phenytoin Rash    Hypersensitivity  reaction  . Vancomycin Rash    Redman's Syndrome    MEDICATIONS:                                                                                                                     I have reviewed the patient's current medications.   ROS:                                                                                                                                       History obtained from father and chart review.  General ROS: negative for - chills, fatigue, fever, night sweats, weight gain or weight loss Psychological ROS: negative for - behavioral disorder, hallucinations, memory difficulties, mood swings or suicidal ideation Ophthalmic ROS: negative for - blurry vision, double vision, eye pain or loss of vision ENT ROS: negative for - epistaxis, nasal discharge, oral lesions, sore throat, tinnitus or vertigo Allergy and Immunology ROS: negative for - hives or itchy/watery eyes Hematological and Lymphatic ROS: negative for - bleeding problems, bruising or swollen lymph nodes Endocrine ROS: negative for - galactorrhea, hair pattern changes, polydipsia/polyuria or temperature intolerance Respiratory ROS: negative for - cough, hemoptysis, shortness of breath or wheezing Cardiovascular ROS: negative for -  chest pain, dyspnea on exertion, edema or irregular heartbeat Gastrointestinal ROS: negative for - abdominal pain, diarrhea, hematemesis, nausea/vomiting or stool incontinence Genito-Urinary ROS: negative for - dysuria, hematuria, incontinence or urinary frequency/urgency Musculoskeletal ROS: negative for - joint swelling or muscular weakness Neurological ROS: as noted in HPI Dermatological ROS: negative for rash and skin lesion changes   Physical exam: pleasant male in no apparent distress. Head: normocephalic. Neck: supple, no bruits, no JVD. Cardiac: no murmurs. Lungs: clear. Abdomen: soft, no tender, no mass. Extremities: no edema.  Blood pressure 100/68, pulse 81, temperature 98.2 F (36.8 C), temperature source Oral, resp. rate 20, height 5\' 3"  (1.6 m), weight 50.349 kg (111 lb), SpO2 100.00%.   Neurologic Examination:  Mental Status: Alert, awake, oriented. Mild dysarthria. No aphasia.  Able to follow 3 step commands without difficulty. Cranial Nerves: II: Discs flat bilaterally; Visual fields grossly normal, pupils equal, round, reactive to light and accommodation [name],IV, VI: ptosis not present, extra-ocular motions intact bilaterally V,VII: smile symmetric, facial light touch sensation normal bilaterally VIII: hearing normal bilaterally IX,X: gag reflex present XI: bilateral shoulder shrug XII: midline tongue extension without atrophy or fasciculations  Motor: Moves all limbs symmetrically. Sensory: Pinprick and light touch intact throughout, bilaterally Deep Tendon Reflexes:  Couldn't elicit. Plantars: No tested Cerebellar: No tested. Gait:  No tested. CV: pulses palpable throughout    No results found for this basename: cbc, bmp, coags, chol, tri, ldl, hga1c    Results for orders placed during the hospital encounter of 08/02/13 (from the past 48 hour(s))   GLUCOSE, CAPILLARY     Status: None   Collection Time    08/02/13  1:28 PM      Result Value Range   Glucose-Capillary 78  70 - 99 mg/dL  PROTIME-INR     Status: Abnormal   Collection Time    08/02/13  1:54 PM      Result Value Range   Prothrombin Time 21.0 (*) 11.6 - 15.2 seconds   INR 1.87 (*) 0.00 - 1.49  CBC WITH DIFFERENTIAL     Status: Abnormal   Collection Time    08/02/13  1:54 PM      Result Value Range   WBC 4.4  4.0 - 10.5 K/uL   RBC 4.23  4.22 - 5.81 MIL/uL   Hemoglobin 12.4 (*) 13.0 - 17.0 g/dL   HCT 16.1 (*) 09.6 - 04.5 %   MCV 86.1  78.0 - 100.0 fL   MCH 29.3  26.0 - 34.0 pg   MCHC 34.1  30.0 - 36.0 g/dL   RDW 40.9  81.1 - 91.4 %   Platelets 198  150 - 400 K/uL   Neutrophils Relative % 63  43 - 77 %   Neutro Abs 2.8  1.7 - 7.7 K/uL   Lymphocytes Relative 29  12 - 46 %   Lymphs Abs 1.3  0.7 - 4.0 K/uL   Monocytes Relative 6  3 - 12 %   Monocytes Absolute 0.3  0.1 - 1.0 K/uL   Eosinophils Relative 1  0 - 5 %   Eosinophils Absolute 0.1  0.0 - 0.7 K/uL   Basophils Relative 0  0 - 1 %   Basophils Absolute 0.0  0.0 - 0.1 K/uL  BASIC METABOLIC PANEL     Status: None   Collection Time    08/02/13  1:54 PM      Result Value Range   Sodium 137  135 - 145 mEq/L   Potassium 3.5  3.5 - 5.1 mEq/L   Chloride 106  96 - 112 mEq/L   CO2 21  19 - 32 mEq/L   Glucose, Bld 92  70 - 99 mg/dL   BUN 6  6 - 23 mg/dL   Creatinine, Ser 7.82  0.50 - 1.35 mg/dL   Calcium 8.5  8.4 - 95.6 mg/dL   GFR calc non Af Amer >90  >90 mL/min   GFR calc Af Amer >90  >90 mL/min   Comment: (NOTE)     The eGFR has been calculated using the CKD EPI equation.     This calculation has not been validated in all clinical situations.     eGFR's persistently <90 mL/min  signify possible Chronic Kidney     Disease.    Dg Chest 2 View  08/02/2013   CLINICAL DATA:  Seizures  EXAM: CHEST  2 VIEW  COMPARISON:  Prior chest x-ray 02/27/2013  FINDINGS: Stable position of right subclavian approach  single-lumen port catheter. Catheter tip projects the superior cavoatrial junction. Left cervical neural stimulator also in unchanged position. Cardiac and mediastinal contours are unchanged. Inspiratory volumes are lower and there is mild bibasilar right-greater-than-left atelectasis. No acute osseous abnormality. No pneumothorax or pleural effusion.  IMPRESSION: Low inspiratory volumes with mild right worse than left bibasilar atelectasis. Given the history of recent multiple seizures, trace aspiration in the right base would be difficult to exclude.  Otherwise, stable chest x-ray without acute abnormality.   Electronically Signed   By: Malachy Moan M.D.   On: 08/02/2013 15:00   Ct Head Wo Contrast  08/02/2013   CLINICAL DATA:  Seizure. Fall from chair.  EXAM: CT HEAD WITHOUT CONTRAST  CT MAXILLOFACIAL WITHOUT CONTRAST  CT CERVICAL SPINE WITHOUT CONTRAST  TECHNIQUE: Multidetector CT imaging of the head, cervical spine, and maxillofacial structures were performed using the standard protocol without intravenous contrast. Multiplanar CT image reconstructions of the cervical spine and maxillofacial structures were also generated.  COMPARISON:  05/12/2013 head CT. 02/22/2013 brain MR. 09/01/2011 CT of the head, cervical spine and face.  FINDINGS: CT HEAD FINDINGS  No skull fracture or intracranial hemorrhage.  Prominent basal ganglia calcifications unchanged (described with metabolic abnormalities).  No CT evidence of large acute infarct.  Cerebellar tonsils minimally low lying but within the range of normal limits.  No intracranial mass lesion noted on this unenhanced exam.  Prominent paranasal sinus opacification with air-fluid level left maxillary sinus.  Partial opacification mastoid air cells without evidence of fracture.  CT MAXILLOFACIAL FINDINGS  Paranasal sinus mucosal thickening. No facial fracture noted.  CT CERVICAL SPINE FINDINGS  Motion degraded examination. No cervical spine fracture detected.   Scoliosis and kyphosis cervical spine.  Stimulating device left neck.  IMPRESSION: No skull fracture or intracranial hemorrhage.  Paranasal sinus mucosal thickening.  No facial fracture noted.  Motion degraded examination. No cervical spine fracture detected.  Scoliosis and kyphosis cervical spine.  Stimulating device left neck.   Electronically Signed   By: Bridgett Larsson M.D.   On: 08/02/2013 15:32   Ct Cervical Spine Wo Contrast  08/02/2013   CLINICAL DATA:  Seizure. Fall from chair.  EXAM: CT HEAD WITHOUT CONTRAST  CT MAXILLOFACIAL WITHOUT CONTRAST  CT CERVICAL SPINE WITHOUT CONTRAST  TECHNIQUE: Multidetector CT imaging of the head, cervical spine, and maxillofacial structures were performed using the standard protocol without intravenous contrast. Multiplanar CT image reconstructions of the cervical spine and maxillofacial structures were also generated.  COMPARISON:  05/12/2013 head CT. 02/22/2013 brain MR. 09/01/2011 CT of the head, cervical spine and face.  FINDINGS: CT HEAD FINDINGS  No skull fracture or intracranial hemorrhage.  Prominent basal ganglia calcifications unchanged (described with metabolic abnormalities).  No CT evidence of large acute infarct.  Cerebellar tonsils minimally low lying but within the range of normal limits.  No intracranial mass lesion noted on this unenhanced exam.  Prominent paranasal sinus opacification with air-fluid level left maxillary sinus.  Partial opacification mastoid air cells without evidence of fracture.  CT MAXILLOFACIAL FINDINGS  Paranasal sinus mucosal thickening. No facial fracture noted.  CT CERVICAL SPINE FINDINGS  Motion degraded examination. No cervical spine fracture detected.  Scoliosis and kyphosis cervical spine.  Stimulating device left  neck.  IMPRESSION: No skull fracture or intracranial hemorrhage.  Paranasal sinus mucosal thickening.  No facial fracture noted.  Motion degraded examination. No cervical spine fracture detected.  Scoliosis and  kyphosis cervical spine.  Stimulating device left neck.   Electronically Signed   By: Bridgett Larsson M.D.   On: 08/02/2013 15:32   Ct Maxillofacial Wo Cm  08/02/2013   CLINICAL DATA:  Seizure. Fall from chair.  EXAM: CT HEAD WITHOUT CONTRAST  CT MAXILLOFACIAL WITHOUT CONTRAST  CT CERVICAL SPINE WITHOUT CONTRAST  TECHNIQUE: Multidetector CT imaging of the head, cervical spine, and maxillofacial structures were performed using the standard protocol without intravenous contrast. Multiplanar CT image reconstructions of the cervical spine and maxillofacial structures were also generated.  COMPARISON:  05/12/2013 head CT. 02/22/2013 brain MR. 09/01/2011 CT of the head, cervical spine and face.  FINDINGS: CT HEAD FINDINGS  No skull fracture or intracranial hemorrhage.  Prominent basal ganglia calcifications unchanged (described with metabolic abnormalities).  No CT evidence of large acute infarct.  Cerebellar tonsils minimally low lying but within the range of normal limits.  No intracranial mass lesion noted on this unenhanced exam.  Prominent paranasal sinus opacification with air-fluid level left maxillary sinus.  Partial opacification mastoid air cells without evidence of fracture.  CT MAXILLOFACIAL FINDINGS  Paranasal sinus mucosal thickening. No facial fracture noted.  CT CERVICAL SPINE FINDINGS  Motion degraded examination. No cervical spine fracture detected.  Scoliosis and kyphosis cervical spine.  Stimulating device left neck.  IMPRESSION: No skull fracture or intracranial hemorrhage.  Paranasal sinus mucosal thickening.  No facial fracture noted.  Motion degraded examination. No cervical spine fracture detected.  Scoliosis and kyphosis cervical spine.  Stimulating device left neck.   Electronically Signed   By: Bridgett Larsson M.D.   On: 08/02/2013 15:32    Assessment/Plan: 27 y/o with Manson Passey syndrome, MR with intractable symptomatic epilepsy, admitted for observation due to a increased seizure frequency and a  cluster of seizures today. Will continue her usual anticonvulsant regimen for now. Ativan IV and phenobarbital IV in case of seizures. Will follow up.  Wyatt Portela, MD [facility] [phone]  08/02/2013, 5:57 PM

## 2013-08-02 NOTE — H&P (Addendum)
[facility] History and Physical  [name] ZOX:[id number] DOB: 1986-07-22 DOA: 08/02/2013  Referring physician:  PCP: Eartha Inch, MD  Specialists: Dr. Sharene Skeans, Ped Neurologist  Chief Complaint: Seizure  HPI: [name] is a 27 y.o. male  With a history of seizure disorder, hypothyroidism, mental disability, brown syndrome, history of atrophic fibrillation, history of pulmonary embolism presents emergency room with his mother after having seizure. His mother states that he's been having small seizures since Friday. She says that the first one happened last night where he is having sensory seizures. She stated he had a grand mal seizure today starting at 9 AM. He then began having another seizure around 11 AM. She had given him some Ativan at that time through his port. As she was obtaining the phenobarbital for his second seizure, patient continued to season actually was found on the floor. Patient did bruise the left side of his face. CT was conducted in the emergency department which showed no fractures. Of note patient is currently taking Coumadin and her history of pulmonary embolism he is currently subtherapeutic. Patient does see Dr. badger as well as Dr. Sharene Skeans. Patient was prescribed amoxicillin for questionable upper respiratory infection by Dr. Cyndia Bent yesterday. His seizure started before he began taking amoxicillin.  Review of Systems:  Constitutional: Denies fever, chills, diaphoresis, appetite change and fatigue.  HEENT: Denies photophobia, eye pain, redness, hearing loss, ear pain, congestion, sore throat, rhinorrhea, sneezing, mouth sores, trouble swallowing, neck pain, neck stiffness and tinnitus.   Respiratory: Denies DOE, cough, chest tightness. complains of some wheezing as well as shortness of breath. Cardiovascular: Denies chest pain, palpitations and leg swelling.  Gastrointestinal: Denies nausea, vomiting, abdominal pain, diarrhea, constipation, blood  in stool and abdominal distention.  Genitourinary: Denies dysuria, urgency, frequency, hematuria, flank pain and difficulty urinating.  Musculoskeletal: Denies myalgias, back pain, joint swelling, arthralgias and gait problem.  Skin: Denies pallor, rash and wound.  Neurological: Denies dizziness, syncope, weakness, light-headedness, numbness and headaches.  Positive for seizures Hematological: Denies adenopathy. Easy bruising, personal or family bleeding history  Psychiatric/Behavioral: Denies suicidal ideation, mood changes, confusion, nervousness, sleep disturbance and agitation  Past Medical History  Diagnosis Date  . ALLERGIC RHINITIS 12/26/2009  . MITRAL VALVE PROLAPSE 06/22/2007  . SEIZURE DISORDER 06/22/2007  . Unspecified hypothyroidism 06/22/2007  . Growth disorder   . Mental retardation, mild (I.Q. 50-70)   . Brown's syndrome   . Heart murmur     moderate MR  . Sleep apnea     cpap , last sleep study 10/01/11  . Hypokalemia   . Atrial fibrillation     During port removal and attempted placement in July 2013, transientt  . Hyperlipidemia     No therapy  . Blood clot in vein    Past Surgical History  Procedure Laterality Date  . Implantation vagal nerve stimulator      Left  . Portacath placement      and removal 04/08/12  . Eye surgery      X2 for Brown Syndrome   Social History:  reports that he has never smoked. He has never used smokeless tobacco. He reports that he does not drink alcohol or use illicit drugs.   Allergies  Allergen Reactions  . Divalproex Sodium Other (See Comments)    Nausea, vomiting, liver and kidney dysfunction  . Phenytoin Rash    Hypersensitivity  reaction  . Vancomycin Rash    Redman's Syndrome    Family History  Problem Relation Age  of Onset  . Breast cancer Mother   . Asthma Father   . Diabetes Father   . Coronary artery disease Maternal Grandfather   . Coronary artery disease Paternal Grandfather   . Lung cancer Paternal  Grandfather     Died at 69  . Pneumonia Paternal Grandmother     Died at 70  . Stroke Paternal Grandmother     Prior to Admission medications   Medication Sig Start Date End Date Taking? Authorizing Provider  amoxicillin (AMOXIL) 875 MG tablet Take 875 mg by mouth 2 (two) times daily.   Yes Historical Provider, MD  carbamazepine (TEGRETOL) 100 MG chewable tablet Chew 2.5 tablets (250 mg total) by mouth 3 (three) times daily. 07/20/13  Yes Elveria Rising, NP  cetirizine (ZYRTEC) 10 MG tablet Take 10 mg by mouth daily.   Yes Historical Provider, MD  diazepam (VALIUM) 2 MG tablet Take 2 mg by mouth every 6 (six) hours as needed (for seizures).   Yes Historical Provider, MD  guaiFENesin-codeine (ROBITUSSIN AC) 100-10 MG/5ML syrup Take 5 mLs by mouth 4 (four) times daily as needed for cough.   Yes Historical Provider, MD  lacosamide (VIMPAT) 50 MG TABS tablet Take 50-100 mg by mouth 3 (three) times daily. 50mg  @ 0800  50mg  @ 1330  100mg  @@ 2130   Yes Historical Provider, MD  levETIRAcetam (KEPPRA) 500 MG tablet Take 1,500 mg by mouth 2 (two) times daily.   Yes Historical Provider, MD  levothyroxine (SYNTHROID, LEVOTHROID) 50 MCG tablet Take 50 mcg by mouth every morning.    Yes Historical Provider, MD  LORazepam (ATIVAN) 2 MG/ML concentrated solution Inject 2 mg into the vein every 8 (eight) hours as needed for seizure (mixed with 1ml NS, injected into Port). It would be given by IV   Yes Historical Provider, MD  Multiple Vitamin (MULTIVITAMIN WITH MINERALS) TABS Take 1 tablet by mouth daily.   Yes Historical Provider, MD  ondansetron (ZOFRAN ODT) 8 MG disintegrating tablet Take 1 tablet (8 mg total) by mouth every 8 (eight) hours as needed for nausea. 05/12/13  Yes Tatyana A Kirichenko, PA-C  [name] 10 MG tablet Take 5 mg by mouth 3 times daily 05/25/13  Yes Elveria Rising, NP  phenobarbital (LUMINAL) 60 MG/ML injection Inject 65 mg into the vein daily as needed (for seizures, mixed with 4ml NS into  Port). For seizures   Yes Historical Provider, MD  potassium chloride SA (K-DUR,KLOR-CON) 20 MEQ tablet Take 20 mEq by mouth 4 (four) times daily. 05/02/13  Yes Elveria Rising, NP  potassium phosphate, monobasic, (K-PHOS ORIGINAL) 500 MG tablet Take 1 tablet (500 mg total) by mouth 4 (four) times daily. 07/04/13  Yes Deetta Perla, MD  rosuvastatin (CRESTOR) 5 MG tablet Take 5 mg by mouth daily.   Yes Historical Provider, MD  TROKENDI XR 200 MG CP24 Take 200 mg by mouth at bedtime. 03/23/13  Yes Deetta Perla, MD  TROKENDI XR 50 MG CP24 Take 2 capsules by mouth at bedtime. 05/17/13  Yes Deetta Perla, MD  warfarin (COUMADIN) 5 MG tablet Take 5-7.5 mg by mouth daily. Take 7.5mg  on Tues. Take 5mg  all other days   Yes Historical Provider, MD   Physical Exam: Filed Vitals:   08/02/13 1501  BP: 102/64  Pulse: 83  Temp:   Resp:      General: Well developed, well nourished, NAD, appears stated age  HEENT: Ranger, PERRLA, EOMI, Anicteic Sclera, mucous membranes moist. No pharyngeal erythema or  exudates.  Laceration to the left inner cheek with dried blood noted in the oropharynx. Bruise noted on patient's left maxillofacial region.  Neck: Supple, no JVD, no masses  Cardiovascular: S1 S2 auscultated, no rubs, murmurs or gallops. Regular rate and rhythm.  Respiratory: Expiratory wheezing noted in all lung fields.  Abdomen: Soft, nontender, nondistended, + bowel sounds  Extremities: warm dry without cyanosis clubbing or edema  Neuro: Awake, alert, oriented to person place and time. Able to answer questions appropriately.  Skin: Without rashes exudates or nodules  Psych: Normal affect and demeanor.  Labs on Admission:  Basic Metabolic Panel:  Recent Labs Lab 08/02/13 1354  NA 137  K 3.5  CL 106  CO2 21  GLUCOSE 92  BUN 6  CREATININE 0.71  CALCIUM 8.5   Liver Function Tests: No results found for this basename: AST, ALT, ALKPHOS, BILITOT, PROT, ALBUMIN,  in the last  168 hours No results found for this basename: LIPASE, AMYLASE,  in the last 168 hours No results found for this basename: AMMONIA,  in the last 168 hours CBC:  Recent Labs Lab 08/02/13 1354  WBC 4.4  NEUTROABS 2.8  HGB 12.4*  HCT 36.4*  MCV 86.1  PLT 198   Cardiac Enzymes: No results found for this basename: CKTOTAL, CKMB, CKMBINDEX, TROPONINI,  in the last 168 hours  BNP (last 3 results) No results found for this basename: PROBNP,  in the last 8760 hours CBG:  Recent Labs Lab 08/02/13 1328  GLUCAP 78    Radiological Exams on Admission: Dg Chest 2 View  08/02/2013   CLINICAL DATA:  Seizures  EXAM: CHEST  2 VIEW  COMPARISON:  Prior chest x-ray 02/27/2013  FINDINGS: Stable position of right subclavian approach single-lumen port catheter. Catheter tip projects the superior cavoatrial junction. Left cervical neural stimulator also in unchanged position. Cardiac and mediastinal contours are unchanged. Inspiratory volumes are lower and there is mild bibasilar right-greater-than-left atelectasis. No acute osseous abnormality. No pneumothorax or pleural effusion.  IMPRESSION: Low inspiratory volumes with mild right worse than left bibasilar atelectasis. Given the history of recent multiple seizures, trace aspiration in the right base would be difficult to exclude.  Otherwise, stable chest x-ray without acute abnormality.   Electronically Signed   By: Malachy Moan M.D.   On: 08/02/2013 15:00   Ct Head Wo Contrast  08/02/2013   CLINICAL DATA:  Seizure. Fall from chair.  EXAM: CT HEAD WITHOUT CONTRAST  CT MAXILLOFACIAL WITHOUT CONTRAST  CT CERVICAL SPINE WITHOUT CONTRAST  TECHNIQUE: Multidetector CT imaging of the head, cervical spine, and maxillofacial structures were performed using the standard protocol without intravenous contrast. Multiplanar CT image reconstructions of the cervical spine and maxillofacial structures were also generated.  COMPARISON:  05/12/2013 head CT. 02/22/2013  brain MR. 09/01/2011 CT of the head, cervical spine and face.  FINDINGS: CT HEAD FINDINGS  No skull fracture or intracranial hemorrhage.  Prominent basal ganglia calcifications unchanged (described with metabolic abnormalities).  No CT evidence of large acute infarct.  Cerebellar tonsils minimally low lying but within the range of normal limits.  No intracranial mass lesion noted on this unenhanced exam.  Prominent paranasal sinus opacification with air-fluid level left maxillary sinus.  Partial opacification mastoid air cells without evidence of fracture.  CT MAXILLOFACIAL FINDINGS  Paranasal sinus mucosal thickening. No facial fracture noted.  CT CERVICAL SPINE FINDINGS  Motion degraded examination. No cervical spine fracture detected.  Scoliosis and kyphosis cervical spine.  Stimulating device left neck.  IMPRESSION: No skull fracture or intracranial hemorrhage.  Paranasal sinus mucosal thickening.  No facial fracture noted.  Motion degraded examination. No cervical spine fracture detected.  Scoliosis and kyphosis cervical spine.  Stimulating device left neck.   Electronically Signed   By: Bridgett Larsson M.D.   On: 08/02/2013 15:32   Ct Cervical Spine Wo Contrast  08/02/2013   CLINICAL DATA:  Seizure. Fall from chair.  EXAM: CT HEAD WITHOUT CONTRAST  CT MAXILLOFACIAL WITHOUT CONTRAST  CT CERVICAL SPINE WITHOUT CONTRAST  TECHNIQUE: Multidetector CT imaging of the head, cervical spine, and maxillofacial structures were performed using the standard protocol without intravenous contrast. Multiplanar CT image reconstructions of the cervical spine and maxillofacial structures were also generated.  COMPARISON:  05/12/2013 head CT. 02/22/2013 brain MR. 09/01/2011 CT of the head, cervical spine and face.  FINDINGS: CT HEAD FINDINGS  No skull fracture or intracranial hemorrhage.  Prominent basal ganglia calcifications unchanged (described with metabolic abnormalities).  No CT evidence of large acute infarct.  Cerebellar  tonsils minimally low lying but within the range of normal limits.  No intracranial mass lesion noted on this unenhanced exam.  Prominent paranasal sinus opacification with air-fluid level left maxillary sinus.  Partial opacification mastoid air cells without evidence of fracture.  CT MAXILLOFACIAL FINDINGS  Paranasal sinus mucosal thickening. No facial fracture noted.  CT CERVICAL SPINE FINDINGS  Motion degraded examination. No cervical spine fracture detected.  Scoliosis and kyphosis cervical spine.  Stimulating device left neck.  IMPRESSION: No skull fracture or intracranial hemorrhage.  Paranasal sinus mucosal thickening.  No facial fracture noted.  Motion degraded examination. No cervical spine fracture detected.  Scoliosis and kyphosis cervical spine.  Stimulating device left neck.   Electronically Signed   By: Bridgett Larsson M.D.   On: 08/02/2013 15:32   Ct Maxillofacial Wo Cm  08/02/2013   CLINICAL DATA:  Seizure. Fall from chair.  EXAM: CT HEAD WITHOUT CONTRAST  CT MAXILLOFACIAL WITHOUT CONTRAST  CT CERVICAL SPINE WITHOUT CONTRAST  TECHNIQUE: Multidetector CT imaging of the head, cervical spine, and maxillofacial structures were performed using the standard protocol without intravenous contrast. Multiplanar CT image reconstructions of the cervical spine and maxillofacial structures were also generated.  COMPARISON:  05/12/2013 head CT. 02/22/2013 brain MR. 09/01/2011 CT of the head, cervical spine and face.  FINDINGS: CT HEAD FINDINGS  No skull fracture or intracranial hemorrhage.  Prominent basal ganglia calcifications unchanged (described with metabolic abnormalities).  No CT evidence of large acute infarct.  Cerebellar tonsils minimally low lying but within the range of normal limits.  No intracranial mass lesion noted on this unenhanced exam.  Prominent paranasal sinus opacification with air-fluid level left maxillary sinus.  Partial opacification mastoid air cells without evidence of fracture.  CT  MAXILLOFACIAL FINDINGS  Paranasal sinus mucosal thickening. No facial fracture noted.  CT CERVICAL SPINE FINDINGS  Motion degraded examination. No cervical spine fracture detected.  Scoliosis and kyphosis cervical spine.  Stimulating device left neck.  IMPRESSION: No skull fracture or intracranial hemorrhage.  Paranasal sinus mucosal thickening.  No facial fracture noted.  Motion degraded examination. No cervical spine fracture detected.  Scoliosis and kyphosis cervical spine.  Stimulating device left neck.   Electronically Signed   By: Bridgett Larsson M.D.   On: 08/02/2013 15:32    Assessment/Plan Principal Problem:   SEIZURE DISORDER Active Problems:   Brown's syndrome   DVT of upper extremity (deep vein thrombosis)   A-fib   Hypothyroidism   Chronic anticoagulation  Subtherapeutic international normalized ratio (INR)  Seizure disorder With the patient for observation. I will contact Dr. Sharene Skeans patient's neurologist. Will continue Ativan as well as his home medications.  Dr. Sharene Skeans can be contacted at [phone] for additional management. Will consult neurohospitalist as well. Pending Keppra level.   Brown's syndrome Stable  History of atrial fibrillation with a history of PE Currently INR is subtherapeutic at 1.8. Will consult pharmacy for Coumadin management.  Left maxillofacial trauma Secondary to fall. CT of the head and face was negative for fracture  ?Upper Respiratory Infection Will continue outpatient amoxicillin.  Hyperlipidemia Continue statin.  DVT prophylaxis: Subtherapeutic with Coumadin, Lovenox   Code Status: Full  Condition: Guarded  Family Communication: Brother at bedside. Admission, patients condition and plan of care including tests being ordered have been discussed with the mother and indicates understanding and agrees with the plan and Code Status.  Disposition Plan: Admitted for observation   Time spent: 45 minutes  Zharia Conrow D.O. [facility] Pager [phone]  If 7PM-7AM, please contact night-coverage [email or web address] Password [email or web address] [facility] 08/02/2013, 4:24 PM

## 2013-08-02 NOTE — Progress Notes (Signed)
ANTICOAGULATION CONSULT NOTE - Initial Consult  Pharmacy Consult for Coumadin  Indication: atrial fibrillation  Allergies  Allergen Reactions  . Divalproex Sodium Other (See Comments)    Nausea, vomiting, liver and kidney dysfunction  . Phenytoin Rash    Hypersensitivity  reaction  . Vancomycin Rash    Redman's Syndrome    Patient Measurements: Height: 5\' 3"  (160 cm) Weight: 111 lb (50.349 kg) IBW/kg (Calculated) : 56.9 Heparin Dosing Weight: n/a  Vital Signs: Temp: 97.6 F (36.4 C) (11/11 1322) Temp src: Oral (11/11 1322) BP: 100/66 mmHg (11/11 1652) Pulse Rate: 82 (11/11 1652)  Labs:  Recent Labs  08/02/13 1354  HGB 12.4*  HCT 36.4*  PLT 198  LABPROT 21.0*  INR 1.87*  CREATININE 0.71    Estimated Creatinine Clearance: 100.4 ml/min (by C-G formula based on Cr of 0.71).   Medical History: Past Medical History  Diagnosis Date  . ALLERGIC RHINITIS 12/26/2009  . MITRAL VALVE PROLAPSE 06/22/2007  . SEIZURE DISORDER 06/22/2007  . Unspecified hypothyroidism 06/22/2007  . Growth disorder   . Mental retardation, mild (I.Q. 50-70)   . Brown's syndrome   . Heart murmur     moderate MR  . Sleep apnea     cpap , last sleep study 10/01/11  . Hypokalemia   . Atrial fibrillation     During port removal and attempted placement in July 2013, transientt  . Hyperlipidemia     No therapy  . Blood clot in vein     Medications:  Prescriptions prior to admission  Medication Sig Dispense Refill  . amoxicillin (AMOXIL) 875 MG tablet Take 875 mg by mouth 2 (two) times daily.      . carbamazepine (TEGRETOL) 100 MG chewable tablet Chew 2.5 tablets (250 mg total) by mouth 3 (three) times daily.  233 tablet  5  . cetirizine (ZYRTEC) 10 MG tablet Take 10 mg by mouth daily.      . diazepam (VALIUM) 2 MG tablet Take 2 mg by mouth every 6 (six) hours as needed (for seizures).      Marland Kitchen guaiFENesin-codeine (ROBITUSSIN AC) 100-10 MG/5ML syrup Take 5 mLs by mouth 4 (four) times daily as  needed for cough.      . lacosamide (VIMPAT) 50 MG TABS tablet Take 50-100 mg by mouth 3 (three) times daily. 50mg  @ 0800  50mg  @ 1330  100mg  @@ 2130      . levETIRAcetam (KEPPRA) 500 MG tablet Take 1,500 mg by mouth 2 (two) times daily.      Marland Kitchen levothyroxine (SYNTHROID, LEVOTHROID) 50 MCG tablet Take 50 mcg by mouth every morning.       Marland Kitchen LORazepam (ATIVAN) 2 MG/ML concentrated solution Inject 2 mg into the vein every 8 (eight) hours as needed for seizure (mixed with 1ml NS, injected into Port). It would be given by IV      . Multiple Vitamin (MULTIVITAMIN WITH MINERALS) TABS Take 1 tablet by mouth daily.      . ondansetron (ZOFRAN ODT) 8 MG disintegrating tablet Take 1 tablet (8 mg total) by mouth every 8 (eight) hours as needed for nausea.  10 tablet  0  . [name] 10 MG tablet Take 5 mg by mouth 3 times daily  45 tablet  3  . phenobarbital (LUMINAL) 60 MG/ML injection Inject 65 mg into the vein daily as needed (for seizures, mixed with 4ml NS into Port). For seizures      . potassium chloride SA (K-DUR,KLOR-CON) 20 MEQ tablet Take  20 mEq by mouth 4 (four) times daily.      . potassium phosphate, monobasic, (K-PHOS ORIGINAL) 500 MG tablet Take 1 tablet (500 mg total) by mouth 4 (four) times daily.  124 tablet  5  . rosuvastatin (CRESTOR) 5 MG tablet Take 5 mg by mouth daily.      Marland Kitchen TROKENDI XR 200 MG CP24 Take 200 mg by mouth at bedtime.  31 capsule  5  . TROKENDI XR 50 MG CP24 Take 2 capsules by mouth at bedtime.  62 capsule  5  . warfarin (COUMADIN) 5 MG tablet Take 5-7.5 mg by mouth daily. Take 7.5mg  on Tues. Take 5mg  all other days        Assessment: 26 YOM on coumadin for h/o Afib and DVT of upper extremity. INR goal 2-2.5 per cardiologist. INR is currently subtherapeutic at 1.87. H/H 12.4/36.4. Plt 198. No signs of bleeding noted.   Home coumadin dose: 7.5 mg on Tue, 5 mg on all other days  Goal of Therapy:  INR 2 - 2.5 Monitor platelets by anticoagulation protocol: Yes   Plan:  1)  Give coumadin 7.5 mg x 1 dose this evening.  2) Monitor CBC, INR and patient for s/s of bleeding 3) Order daily INR.   Vinnie Level, PharmD.  [place] License #[id number] Application for [place] reciprocity pending  Clinical Pharmacist Pager [phone]

## 2013-08-02 NOTE — ED Notes (Signed)
Pt had grandmal seizure around 1030 and he then went into another grandmal seizure and face planted into the floor.  Mom gives medications via portacath.    Phenobarbitol 65mg  given and 2mg  Ativan given at home.  Pt is arousable and has brusing to left face.

## 2013-08-02 NOTE — ED Notes (Signed)
Pt returned from radiology.

## 2013-08-02 NOTE — Progress Notes (Signed)
Report called on at 1711 and patient got to the unit at 1731, accompanied by his parents. Vitals checked were stable.

## 2013-08-02 NOTE — Progress Notes (Signed)
I agree with the above.  Louie Casa, PharmD, BCPS 08/02/13, 5:46 PM

## 2013-08-03 DIAGNOSIS — J309 Allergic rhinitis, unspecified: Secondary | ICD-10-CM

## 2013-08-03 DIAGNOSIS — E876 Hypokalemia: Secondary | ICD-10-CM

## 2013-08-03 LAB — BASIC METABOLIC PANEL
BUN: 10 mg/dL (ref 6–23)
CO2: 20 mEq/L (ref 19–32)
Chloride: 106 mEq/L (ref 96–112)
Glucose, Bld: 95 mg/dL (ref 70–99)
Potassium: 3.4 mEq/L — ABNORMAL LOW (ref 3.5–5.1)

## 2013-08-03 LAB — CBC
HCT: 35.1 % — ABNORMAL LOW (ref 39.0–52.0)
Hemoglobin: 11.6 g/dL — ABNORMAL LOW (ref 13.0–17.0)
MCH: 28.4 pg (ref 26.0–34.0)
MCHC: 33 g/dL (ref 30.0–36.0)
MCV: 86 fL (ref 78.0–100.0)

## 2013-08-03 LAB — LEVETIRACETAM LEVEL: Levetiracetam Lvl: 57.3 ug/mL — ABNORMAL HIGH (ref 5.0–30.0)

## 2013-08-03 MED ORDER — CLOBAZAM 10 MG PO TABS
5.0000 mg | ORAL_TABLET | Freq: Three times a day (TID) | ORAL | Status: DC
  Administered 2013-08-03 – 2013-08-04 (×5): 5 mg via ORAL
  Filled 2013-08-03 (×5): qty 1

## 2013-08-03 MED ORDER — INFLUENZA VAC SPLIT QUAD 0.5 ML IM SUSP
0.5000 mL | INTRAMUSCULAR | Status: AC
  Administered 2013-08-04: 0.5 mL via INTRAMUSCULAR
  Filled 2013-08-03: qty 0.5

## 2013-08-03 MED ORDER — SODIUM CHLORIDE 0.9 % IJ SOLN
10.0000 mL | INTRAMUSCULAR | Status: DC | PRN
  Administered 2013-08-03 – 2013-08-04 (×2): 10 mL

## 2013-08-03 MED ORDER — WARFARIN SODIUM 7.5 MG PO TABS
7.5000 mg | ORAL_TABLET | Freq: Once | ORAL | Status: AC
  Administered 2013-08-03: 7.5 mg via ORAL
  Filled 2013-08-03 (×2): qty 1

## 2013-08-03 MED ORDER — SODIUM CHLORIDE 0.9 % IV BOLUS (SEPSIS)
500.0000 mL | Freq: Once | INTRAVENOUS | Status: AC
  Administered 2013-08-03: 500 mL via INTRAVENOUS

## 2013-08-03 NOTE — Progress Notes (Signed)
ANTICOAGULATION CONSULT NOTE - follow up  Pharmacy Consult for Coumadin  Indication: atrial fibrillation  Allergies  Allergen Reactions  . Divalproex Sodium Other (See Comments)    Nausea, vomiting, liver and kidney dysfunction  . Phenytoin Rash    Hypersensitivity  reaction  . Vancomycin Rash    Redman's Syndrome    Patient Measurements: Height: 5\' 3"  (160 cm) Weight: 111 lb (50.349 kg) IBW/kg (Calculated) : 56.9   Vital Signs: Temp: 97.6 F (36.4 C) (11/12 1031) Temp src: Oral (11/12 1031) BP: 111/78 mmHg (11/12 1031) Pulse Rate: 78 (11/12 1031)  Labs:  Recent Labs  08/02/13 1354 08/03/13 0355  HGB 12.4* 11.6*  HCT 36.4* 35.1*  PLT 198 207  LABPROT 21.0* 22.2*  INR 1.87* 2.02*  CREATININE 0.71 0.75    Estimated Creatinine Clearance: 100.4 ml/min (by C-G formula based on Cr of 0.75).   Medical History: Past Medical History  Diagnosis Date  . ALLERGIC RHINITIS 12/26/2009  . MITRAL VALVE PROLAPSE 06/22/2007  . SEIZURE DISORDER 06/22/2007  . Unspecified hypothyroidism 06/22/2007  . Growth disorder   . Mental retardation, mild (I.Q. 50-70)   . Brown's syndrome   . Heart murmur     moderate MR  . Sleep apnea     cpap , last sleep study 10/01/11  . Hypokalemia   . Atrial fibrillation     During port removal and attempted placement in July 2013, transientt  . Hyperlipidemia     No therapy  . Blood clot in vein     Medications:  Prescriptions prior to admission  Medication Sig Dispense Refill  . amoxicillin (AMOXIL) 875 MG tablet Take 875 mg by mouth 2 (two) times daily.      . carbamazepine (TEGRETOL) 100 MG chewable tablet Chew 2.5 tablets (250 mg total) by mouth 3 (three) times daily.  233 tablet  5  . cetirizine (ZYRTEC) 10 MG tablet Take 10 mg by mouth daily.      . diazepam (VALIUM) 2 MG tablet Take 2 mg by mouth every 6 (six) hours as needed (for seizures).      Marland Kitchen guaiFENesin-codeine (ROBITUSSIN AC) 100-10 MG/5ML syrup Take 5 mLs by mouth 4 (four)  times daily as needed for cough.      . lacosamide (VIMPAT) 50 MG TABS tablet Take 50-100 mg by mouth 3 (three) times daily. 50mg  @ 0800  50mg  @ 1330  100mg  @@ 2130      . levETIRAcetam (KEPPRA) 500 MG tablet Take 1,500 mg by mouth 2 (two) times daily.      Marland Kitchen levothyroxine (SYNTHROID, LEVOTHROID) 50 MCG tablet Take 50 mcg by mouth every morning.       Marland Kitchen LORazepam (ATIVAN) 2 MG/ML concentrated solution Inject 2 mg into the vein every 8 (eight) hours as needed for seizure (mixed with 1ml NS, injected into Port). It would be given by IV      . Multiple Vitamin (MULTIVITAMIN WITH MINERALS) TABS Take 1 tablet by mouth daily.      . ondansetron (ZOFRAN ODT) 8 MG disintegrating tablet Take 1 tablet (8 mg total) by mouth every 8 (eight) hours as needed for nausea.  10 tablet  0  . [name] 10 MG tablet Take 5 mg by mouth 3 times daily  45 tablet  3  . phenobarbital (LUMINAL) 60 MG/ML injection Inject 65 mg into the vein daily as needed (for seizures, mixed with 4ml NS into Port). For seizures      . potassium chloride SA (  K-DUR,KLOR-CON) 20 MEQ tablet Take 20 mEq by mouth 4 (four) times daily.      . potassium phosphate, monobasic, (K-PHOS ORIGINAL) 500 MG tablet Take 1 tablet (500 mg total) by mouth 4 (four) times daily.  124 tablet  5  . rosuvastatin (CRESTOR) 5 MG tablet Take 5 mg by mouth daily.      Marland Kitchen TROKENDI XR 200 MG CP24 Take 200 mg by mouth at bedtime.  31 capsule  5  . TROKENDI XR 50 MG CP24 Take 2 capsules by mouth at bedtime.  62 capsule  5  . warfarin (COUMADIN) 5 MG tablet Take 5-7.5 mg by mouth daily. Take 7.5mg  on Tues. Take 5mg  all other days        Assessment: 26 YOM on coumadin for h/o Afib and DVT of upper extremity. INR goal 2-2.5 per cardiologist. INR therapeutic today  Home coumadin dose: 7.5 mg on Tue, 5 mg on all other days  Goal of Therapy:  INR 2 - 2.5 Monitor platelets by anticoagulation protocol: Yes   Plan:  1) Give coumadin 7.5 mg x 1 dose this evening.  2) Monitor  CBC, INR and patient for s/s of bleeding   Talbert Cage, PharmD.  Clinical Pharmacist

## 2013-08-03 NOTE — Progress Notes (Signed)
NEURO HOSPITALIST PROGRESS NOTE   SUBJECTIVE:                                                                                                                        No further seizures over night. Doing well.   OBJECTIVE:                                                                                                                           Vital signs in last 24 hours: Temp:  [97.1 F (36.2 C)-98.2 F (36.8 C)] 97.6 F (36.4 C) (11/12 1031) Pulse Rate:  [65-95] 78 (11/12 1031) Resp:  [18-20] 18 (11/12 1031) BP: (78-111)/(51-78) 111/78 mmHg (11/12 1031) SpO2:  [96 %-100 %] 100 % (11/12 1031) Weight:  [50.349 kg (111 lb)] 50.349 kg (111 lb) (11/11 1322)  Intake/Output from previous day:   Intake/Output this shift: Total I/O In: 160 [P.O.:160] Out: -  Nutritional status: Cardiac  Past Medical History  Diagnosis Date  . ALLERGIC RHINITIS 12/26/2009  . MITRAL VALVE PROLAPSE 06/22/2007  . SEIZURE DISORDER 06/22/2007  . Unspecified hypothyroidism 06/22/2007  . Growth disorder   . Mental retardation, mild (I.Q. 50-70)   . Brown's syndrome   . Heart murmur     moderate MR  . Sleep apnea     cpap , last sleep study 10/01/11  . Hypokalemia   . Atrial fibrillation     During port removal and attempted placement in July 2013, transientt  . Hyperlipidemia     No therapy  . Blood clot in vein     Neurologic Exam:  Mental Status: Alert, able to follow commands, mild dysarthria and verbal output minimal.   Cranial Nerves: II: Visual fields grossly normal, pupils equal, round, reactive to light and accommodation [name],IV, VI: ptosis present right eye (old), extra-ocular motions intact bilaterally with some dysconjugate gaze (old) V,VII: smile symmetric, facial light touch sensation normal bilaterally VIII: hearing normal bilaterally IX,X: gag reflex present XI: bilateral shoulder shrug XII: midline tongue extension without atrophy or  fasciculations  Motor: Moves all limbs symmetrical  Tone and bulk:normal tone throughout; no atrophy noted Sensory: Pinprick and light touch intact throughout, bilaterally Deep Tendon Reflexes:  Depressed throught  Plantars: Mute bilaterally  Lab Results: No results found for this basename: cbc, bmp, coags, chol, tri, ldl, hga1c   Lipid Panel No results found for this basename: CHOL, TRIG, HDL, CHOLHDL, VLDL, LDLCALC,  in the last 72 hours  Studies/Results: Dg Chest 2 View  08/02/2013   CLINICAL DATA:  Seizures  EXAM: CHEST  2 VIEW  COMPARISON:  Prior chest x-ray 02/27/2013  FINDINGS: Stable position of right subclavian approach single-lumen port catheter. Catheter tip projects the superior cavoatrial junction. Left cervical neural stimulator also in unchanged position. Cardiac and mediastinal contours are unchanged. Inspiratory volumes are lower and there is mild bibasilar right-greater-than-left atelectasis. No acute osseous abnormality. No pneumothorax or pleural effusion.  IMPRESSION: Low inspiratory volumes with mild right worse than left bibasilar atelectasis. Given the history of recent multiple seizures, trace aspiration in the right base would be difficult to exclude.  Otherwise, stable chest x-ray without acute abnormality.   Electronically Signed   By: Malachy Moan M.D.   On: 08/02/2013 15:00   Ct Head Wo Contrast  08/02/2013   CLINICAL DATA:  Seizure. Fall from chair.  EXAM: CT HEAD WITHOUT CONTRAST  CT MAXILLOFACIAL WITHOUT CONTRAST  CT CERVICAL SPINE WITHOUT CONTRAST  TECHNIQUE: Multidetector CT imaging of the head, cervical spine, and maxillofacial structures were performed using the standard protocol without intravenous contrast. Multiplanar CT image reconstructions of the cervical spine and maxillofacial structures were also generated.  COMPARISON:  05/12/2013 head CT. 02/22/2013 brain MR. 09/01/2011 CT of the head, cervical spine and face.  FINDINGS: CT HEAD  FINDINGS  No skull fracture or intracranial hemorrhage.  Prominent basal ganglia calcifications unchanged (described with metabolic abnormalities).  No CT evidence of large acute infarct.  Cerebellar tonsils minimally low lying but within the range of normal limits.  No intracranial mass lesion noted on this unenhanced exam.  Prominent paranasal sinus opacification with air-fluid level left maxillary sinus.  Partial opacification mastoid air cells without evidence of fracture.  CT MAXILLOFACIAL FINDINGS  Paranasal sinus mucosal thickening. No facial fracture noted.  CT CERVICAL SPINE FINDINGS  Motion degraded examination. No cervical spine fracture detected.  Scoliosis and kyphosis cervical spine.  Stimulating device left neck.  IMPRESSION: No skull fracture or intracranial hemorrhage.  Paranasal sinus mucosal thickening.  No facial fracture noted.  Motion degraded examination. No cervical spine fracture detected.  Scoliosis and kyphosis cervical spine.  Stimulating device left neck.   Electronically Signed   By: Bridgett Larsson M.D.   On: 08/02/2013 15:32   Ct Cervical Spine Wo Contrast  08/02/2013   CLINICAL DATA:  Seizure. Fall from chair.  EXAM: CT HEAD WITHOUT CONTRAST  CT MAXILLOFACIAL WITHOUT CONTRAST  CT CERVICAL SPINE WITHOUT CONTRAST  TECHNIQUE: Multidetector CT imaging of the head, cervical spine, and maxillofacial structures were performed using the standard protocol without intravenous contrast. Multiplanar CT image reconstructions of the cervical spine and maxillofacial structures were also generated.  COMPARISON:  05/12/2013 head CT. 02/22/2013 brain MR. 09/01/2011 CT of the head, cervical spine and face.  FINDINGS: CT HEAD FINDINGS  No skull fracture or intracranial hemorrhage.  Prominent basal ganglia calcifications unchanged (described with metabolic abnormalities).  No CT evidence of large acute infarct.  Cerebellar tonsils minimally low lying but within the range of normal limits.  No  intracranial mass lesion noted on this unenhanced exam.  Prominent paranasal sinus opacification with air-fluid level left maxillary sinus.  Partial opacification mastoid air cells without evidence of fracture.  CT MAXILLOFACIAL FINDINGS  Paranasal sinus mucosal thickening. No  facial fracture noted.  CT CERVICAL SPINE FINDINGS  Motion degraded examination. No cervical spine fracture detected.  Scoliosis and kyphosis cervical spine.  Stimulating device left neck.  IMPRESSION: No skull fracture or intracranial hemorrhage.  Paranasal sinus mucosal thickening.  No facial fracture noted.  Motion degraded examination. No cervical spine fracture detected.  Scoliosis and kyphosis cervical spine.  Stimulating device left neck.   Electronically Signed   By: Bridgett Larsson M.D.   On: 08/02/2013 15:32   Ct Maxillofacial Wo Cm  08/02/2013   CLINICAL DATA:  Seizure. Fall from chair.  EXAM: CT HEAD WITHOUT CONTRAST  CT MAXILLOFACIAL WITHOUT CONTRAST  CT CERVICAL SPINE WITHOUT CONTRAST  TECHNIQUE: Multidetector CT imaging of the head, cervical spine, and maxillofacial structures were performed using the standard protocol without intravenous contrast. Multiplanar CT image reconstructions of the cervical spine and maxillofacial structures were also generated.  COMPARISON:  05/12/2013 head CT. 02/22/2013 brain MR. 09/01/2011 CT of the head, cervical spine and face.  FINDINGS: CT HEAD FINDINGS  No skull fracture or intracranial hemorrhage.  Prominent basal ganglia calcifications unchanged (described with metabolic abnormalities).  No CT evidence of large acute infarct.  Cerebellar tonsils minimally low lying but within the range of normal limits.  No intracranial mass lesion noted on this unenhanced exam.  Prominent paranasal sinus opacification with air-fluid level left maxillary sinus.  Partial opacification mastoid air cells without evidence of fracture.  CT MAXILLOFACIAL FINDINGS  Paranasal sinus mucosal thickening. No facial  fracture noted.  CT CERVICAL SPINE FINDINGS  Motion degraded examination. No cervical spine fracture detected.  Scoliosis and kyphosis cervical spine.  Stimulating device left neck.  IMPRESSION: No skull fracture or intracranial hemorrhage.  Paranasal sinus mucosal thickening.  No facial fracture noted.  Motion degraded examination. No cervical spine fracture detected.  Scoliosis and kyphosis cervical spine.  Stimulating device left neck.   Electronically Signed   By: Bridgett Larsson M.D.   On: 08/02/2013 15:32    MEDICATIONS                                                                                                                        Scheduled: . amoxicillin  1,000 mg Oral q morning - 10a   And  . amoxicillin  500 mg Oral QPM  . atorvastatin  10 mg Oral q1800  . carbamazepine  250 mg Oral TID  . clobazam  5 mg Oral TID  . [START ON 08/04/2013] influenza vac split quadrivalent PF  0.5 mL Intramuscular Tomorrow-1000  . lacosamide  50 mg Oral BID   And  . lacosamide  100 mg Oral QPM  . levETIRAcetam  1,500 mg Oral BID  . levothyroxine  50 mcg Oral QAC breakfast  . loratadine  10 mg Oral Daily  . multivitamin with minerals  1 tablet Oral Daily  . potassium chloride SA  20 mEq Oral QID  . potassium phosphate (monobasic)  500 mg Oral QID  . sodium chloride  3 mL Intravenous Q12H  . sodium chloride  3 mL Intravenous Q12H  . Topiramate ER  200 mg Oral QHS   And  . Topiramate ER  100 mg Oral QHS  . warfarin  7.5 mg Oral ONCE-1800  . 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 No further seizures over night. Will continue her usual anticonvulsant regimen for now.  Ativan IV and phenobarbital IV in case of  seizures.  Will follow up.   Assessment and plan discussed with with attending physician and they are in agreement.    Marcus Morn PA-C [facility] [phone]  08/03/2013, 11:39 AM

## 2013-08-03 NOTE — Progress Notes (Signed)
UR COMPLETED  

## 2013-08-03 NOTE — Progress Notes (Signed)
[facility] PROGRESS NOTE  Duc Crocket ZOX:[id number] DOB: 1986/05/08 DOA: 08/02/2013 PCP: Eartha Inch, MD  Assessment/Plan: Seizure disorder  Neurologist on board, I have reviewed the recommendations for today Patient is on carbamazepine Keppra  Brown's syndrome  Stable   History of atrial fibrillation with a history of PE  INR therapeutic at 2.0 Coumadin per pharmacy consult   Left maxillofacial trauma  Secondary to fall presumed to be secondary to breakthrough seizure. CT of the head and face was negative for fracture   ?Upper Respiratory Infection  Will continue outpatient amoxicillin.   Hyperlipidemia  Continue statin.  Hypokalemia -Will plan on replacing orally and reassessing next a.m. -Will check magnesium levels as well  Code Status: Full Family Communication: Discussed with patient and mother at bedside Disposition Plan: Pending further recommendations from neurology, once stable for discharge from neurology standpoint we'll plan on discharging home   Consultants:  Neurology  Procedures:  Please refer to chart  Antibiotics:  Amoxicillin  HPI/Subjective: No new complaints today, no reported seizure-like activity.  Objective: Filed Vitals:   08/03/13 1031  BP: 111/78  Pulse: 78  Temp: 97.6 F (36.4 C)  Resp: 18    Intake/Output Summary (Last 24 hours) at 08/03/13 1639 Last data filed at 08/03/13 1300  Gross per 24 hour  Intake    520 ml  Output      0 ml  Net    520 ml   Filed Weights   08/02/13 1322  Weight: 50.349 kg (111 lb)    Exam:   General:  NAD, Alert and Awake  Cardiovascular: RRR, no MRG  Respiratory: no wheezes, No increased work of breathing  Abdomen: nt, nt  Musculoskeletal: no cyanosis   Data Reviewed: Basic Metabolic Panel:  Recent Labs Lab 08/02/13 1354 08/03/13 0355  NA 137 138  K 3.5 3.4*  CL 106 106  CO2 21 20  GLUCOSE 92 95  BUN 6 10  CREATININE 0.71 0.75  CALCIUM 8.5 7.8*    Liver Function Tests: No results found for this basename: AST, ALT, ALKPHOS, BILITOT, PROT, ALBUMIN,  in the last 168 hours No results found for this basename: LIPASE, AMYLASE,  in the last 168 hours No results found for this basename: AMMONIA,  in the last 168 hours CBC:  Recent Labs Lab 08/02/13 1354 08/03/13 0355  WBC 4.4 4.5  NEUTROABS 2.8  --   HGB 12.4* 11.6*  HCT 36.4* 35.1*  MCV 86.1 86.0  PLT 198 207   Cardiac Enzymes: No results found for this basename: CKTOTAL, CKMB, CKMBINDEX, TROPONINI,  in the last 168 hours BNP (last 3 results) No results found for this basename: PROBNP,  in the last 8760 hours CBG:  Recent Labs Lab 08/02/13 1328  GLUCAP 78    No results found for this or any previous visit (from the past 240 hour(s)).   Studies: Dg Chest 2 View  08/02/2013   CLINICAL DATA:  Seizures  EXAM: CHEST  2 VIEW  COMPARISON:  Prior chest x-ray 02/27/2013  FINDINGS: Stable position of right subclavian approach single-lumen port catheter. Catheter tip projects the superior cavoatrial junction. Left cervical neural stimulator also in unchanged position. Cardiac and mediastinal contours are unchanged. Inspiratory volumes are lower and there is mild bibasilar right-greater-than-left atelectasis. No acute osseous abnormality. No pneumothorax or pleural effusion.  IMPRESSION: Low inspiratory volumes with mild right worse than left bibasilar atelectasis. Given the history of recent multiple seizures, trace aspiration in the right base would be difficult  to exclude.  Otherwise, stable chest x-ray without acute abnormality.   Electronically Signed   By: Malachy Moan M.D.   On: 08/02/2013 15:00   Ct Head Wo Contrast  08/02/2013   CLINICAL DATA:  Seizure. Fall from chair.  EXAM: CT HEAD WITHOUT CONTRAST  CT MAXILLOFACIAL WITHOUT CONTRAST  CT CERVICAL SPINE WITHOUT CONTRAST  TECHNIQUE: Multidetector CT imaging of the head, cervical spine, and maxillofacial structures were  performed using the standard protocol without intravenous contrast. Multiplanar CT image reconstructions of the cervical spine and maxillofacial structures were also generated.  COMPARISON:  05/12/2013 head CT. 02/22/2013 brain MR. 09/01/2011 CT of the head, cervical spine and face.  FINDINGS: CT HEAD FINDINGS  No skull fracture or intracranial hemorrhage.  Prominent basal ganglia calcifications unchanged (described with metabolic abnormalities).  No CT evidence of large acute infarct.  Cerebellar tonsils minimally low lying but within the range of normal limits.  No intracranial mass lesion noted on this unenhanced exam.  Prominent paranasal sinus opacification with air-fluid level left maxillary sinus.  Partial opacification mastoid air cells without evidence of fracture.  CT MAXILLOFACIAL FINDINGS  Paranasal sinus mucosal thickening. No facial fracture noted.  CT CERVICAL SPINE FINDINGS  Motion degraded examination. No cervical spine fracture detected.  Scoliosis and kyphosis cervical spine.  Stimulating device left neck.  IMPRESSION: No skull fracture or intracranial hemorrhage.  Paranasal sinus mucosal thickening.  No facial fracture noted.  Motion degraded examination. No cervical spine fracture detected.  Scoliosis and kyphosis cervical spine.  Stimulating device left neck.   Electronically Signed   By: Bridgett Larsson M.D.   On: 08/02/2013 15:32   Ct Cervical Spine Wo Contrast  08/02/2013   CLINICAL DATA:  Seizure. Fall from chair.  EXAM: CT HEAD WITHOUT CONTRAST  CT MAXILLOFACIAL WITHOUT CONTRAST  CT CERVICAL SPINE WITHOUT CONTRAST  TECHNIQUE: Multidetector CT imaging of the head, cervical spine, and maxillofacial structures were performed using the standard protocol without intravenous contrast. Multiplanar CT image reconstructions of the cervical spine and maxillofacial structures were also generated.  COMPARISON:  05/12/2013 head CT. 02/22/2013 brain MR. 09/01/2011 CT of the head, cervical spine and  face.  FINDINGS: CT HEAD FINDINGS  No skull fracture or intracranial hemorrhage.  Prominent basal ganglia calcifications unchanged (described with metabolic abnormalities).  No CT evidence of large acute infarct.  Cerebellar tonsils minimally low lying but within the range of normal limits.  No intracranial mass lesion noted on this unenhanced exam.  Prominent paranasal sinus opacification with air-fluid level left maxillary sinus.  Partial opacification mastoid air cells without evidence of fracture.  CT MAXILLOFACIAL FINDINGS  Paranasal sinus mucosal thickening. No facial fracture noted.  CT CERVICAL SPINE FINDINGS  Motion degraded examination. No cervical spine fracture detected.  Scoliosis and kyphosis cervical spine.  Stimulating device left neck.  IMPRESSION: No skull fracture or intracranial hemorrhage.  Paranasal sinus mucosal thickening.  No facial fracture noted.  Motion degraded examination. No cervical spine fracture detected.  Scoliosis and kyphosis cervical spine.  Stimulating device left neck.   Electronically Signed   By: Bridgett Larsson M.D.   On: 08/02/2013 15:32   Ct Maxillofacial Wo Cm  08/02/2013   CLINICAL DATA:  Seizure. Fall from chair.  EXAM: CT HEAD WITHOUT CONTRAST  CT MAXILLOFACIAL WITHOUT CONTRAST  CT CERVICAL SPINE WITHOUT CONTRAST  TECHNIQUE: Multidetector CT imaging of the head, cervical spine, and maxillofacial structures were performed using the standard protocol without intravenous contrast. Multiplanar CT image reconstructions of the cervical  spine and maxillofacial structures were also generated.  COMPARISON:  05/12/2013 head CT. 02/22/2013 brain MR. 09/01/2011 CT of the head, cervical spine and face.  FINDINGS: CT HEAD FINDINGS  No skull fracture or intracranial hemorrhage.  Prominent basal ganglia calcifications unchanged (described with metabolic abnormalities).  No CT evidence of large acute infarct.  Cerebellar tonsils minimally low lying but within the range of normal  limits.  No intracranial mass lesion noted on this unenhanced exam.  Prominent paranasal sinus opacification with air-fluid level left maxillary sinus.  Partial opacification mastoid air cells without evidence of fracture.  CT MAXILLOFACIAL FINDINGS  Paranasal sinus mucosal thickening. No facial fracture noted.  CT CERVICAL SPINE FINDINGS  Motion degraded examination. No cervical spine fracture detected.  Scoliosis and kyphosis cervical spine.  Stimulating device left neck.  IMPRESSION: No skull fracture or intracranial hemorrhage.  Paranasal sinus mucosal thickening.  No facial fracture noted.  Motion degraded examination. No cervical spine fracture detected.  Scoliosis and kyphosis cervical spine.  Stimulating device left neck.   Electronically Signed   By: Bridgett Larsson M.D.   On: 08/02/2013 15:32    Scheduled Meds: . amoxicillin  1,000 mg Oral q morning - 10a   And  . amoxicillin  500 mg Oral QPM  . atorvastatin  10 mg Oral q1800  . carbamazepine  250 mg Oral TID  . clobazam  5 mg Oral TID  . [START ON 08/04/2013] influenza vac split quadrivalent PF  0.5 mL Intramuscular Tomorrow-1000  . lacosamide  50 mg Oral BID   And  . lacosamide  100 mg Oral QPM  . levETIRAcetam  1,500 mg Oral BID  . levothyroxine  50 mcg Oral QAC breakfast  . loratadine  10 mg Oral Daily  . multivitamin with minerals  1 tablet Oral Daily  . potassium chloride SA  20 mEq Oral QID  . potassium phosphate (monobasic)  500 mg Oral QID  . sodium chloride  3 mL Intravenous Q12H  . sodium chloride  3 mL Intravenous Q12H  . Topiramate ER  200 mg Oral QHS   And  . Topiramate ER  100 mg Oral QHS  . warfarin  7.5 mg Oral ONCE-1800  . Warfarin - Pharmacist Dosing Inpatient   Does not apply q1800   Continuous Infusions:   Principal Problem:   SEIZURE DISORDER Active Problems:   Brown's syndrome   DVT of upper extremity (deep vein thrombosis)   A-fib   Hypothyroidism   Chronic anticoagulation   Subtherapeutic  international normalized ratio (INR)    Time spent: > 35 min    Penny Pia  [facility] Pager [phone] If 7PM-7AM, please contact night-coverage at [email or web address], password [facility] 08/03/2013, 4:39 PM  LOS: 1 day

## 2013-08-04 DIAGNOSIS — G40401 Other generalized epilepsy and epileptic syndromes, not intractable, with status epilepticus: Secondary | ICD-10-CM

## 2013-08-04 DIAGNOSIS — I82B19 Acute embolism and thrombosis of unspecified subclavian vein: Secondary | ICD-10-CM

## 2013-08-04 DIAGNOSIS — E876 Hypokalemia: Secondary | ICD-10-CM

## 2013-08-04 DIAGNOSIS — I82629 Acute embolism and thrombosis of deep veins of unspecified upper extremity: Secondary | ICD-10-CM

## 2013-08-04 MED ORDER — TOPIRAMATE ER 200 MG PO CAP24: 200.0000 mg | ORAL_CAPSULE | Freq: Every day | ORAL | Status: DC

## 2013-08-04 MED ORDER — WARFARIN SODIUM 5 MG PO TABS
5.0000 mg | ORAL_TABLET | Freq: Once | ORAL | Status: DC
  Filled 2013-08-04: qty 1

## 2013-08-04 MED ORDER — LACOSAMIDE 50 MG PO TABS: 50.0000 mg | ORAL_TABLET | Freq: Two times a day (BID) | ORAL | Status: DC

## 2013-08-04 MED ORDER — LACOSAMIDE 100 MG PO TABS: 100.0000 mg | ORAL_TABLET | Freq: Every evening | ORAL | Status: DC

## 2013-08-04 MED ORDER — ALTEPLASE 2 MG IJ SOLR
2.0000 mg | Freq: Once | INTRAMUSCULAR | Status: DC
  Filled 2013-08-04: qty 2

## 2013-08-04 MED ORDER — HEPARIN SOD (PORK) LOCK FLUSH 100 UNIT/ML IV SOLN: 250.0000 [IU] | INTRAVENOUS | Status: DC | PRN

## 2013-08-04 MED ORDER — HEPARIN SOD (PORK) LOCK FLUSH 100 UNIT/ML IV SOLN
500.0000 [IU] | INTRAVENOUS | Status: AC | PRN
  Administered 2013-08-04: 500 [IU]

## 2013-08-04 MED ORDER — LEVETIRACETAM 750 MG PO TABS: 1500.0000 mg | ORAL_TABLET | Freq: Two times a day (BID) | ORAL | Status: DC

## 2013-08-04 MED ORDER — HEPARIN SOD (PORK) LOCK FLUSH 100 UNIT/ML IV SOLN: 250.0000 [IU] | Freq: Every day | INTRAVENOUS | Status: DC

## 2013-08-04 MED ORDER — TOPIRAMATE ER 50 MG PO CAP24: 100.0000 mg | ORAL_CAPSULE | Freq: Every day | ORAL | Status: DC

## 2013-08-04 NOTE — Progress Notes (Signed)
NEURO HOSPITALIST PROGRESS NOTE   SUBJECTIVE:                                                                                                                         Patient has remained seizure free while in hospital. Currently watching T.V. With no complaints.  OBJECTIVE:                                                                                                                           Vital signs in last 24 hours: Temp:  [97.3 F (36.3 C)-97.6 F (36.4 C)] 97.4 F (36.3 C) (11/13 0900) Pulse Rate:  [60-89] 89 (11/13 0900) Resp:  [16-20] 20 (11/13 0900) BP: (84-103)/(50-79) 103/79 mmHg (11/13 0900) SpO2:  [97 %-100 %] 100 % (11/13 0900)  Intake/Output from previous day: 11/12 0701 - 11/13 0700 In: 880 [P.O.:880] Out: 100 [Urine:100] Intake/Output this shift:   Nutritional status: Cardiac  Past Medical History  Diagnosis Date  . ALLERGIC RHINITIS 12/26/2009  . MITRAL VALVE PROLAPSE 06/22/2007  . SEIZURE DISORDER 06/22/2007  . Unspecified hypothyroidism 06/22/2007  . Growth disorder   . Mental retardation, mild (I.Q. 50-70)   . Brown's syndrome   . Heart murmur     moderate MR  . Sleep apnea     cpap , last sleep study 10/01/11  . Hypokalemia   . Atrial fibrillation     During port removal and attempted placement in July 2013, transientt  . Hyperlipidemia     No therapy  . Blood clot in vein      Neurologic Exam:  Mental Status:  Alert, able to follow commands, mild dysarthria when speaking. Able to follow all commands.  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), extra-ocular motions intact bilaterally with some dysconjugate gaze (old)  V,VII: smile symmetric, facial light touch sensation normal bilaterally  VIII: hearing normal bilaterally  IX,X: gag reflex present  XI: bilateral shoulder shrug  XII: midline  tongue extension without atrophy or fasciculations  Motor:  Moves all limbs symmetrical  Tone and bulk:normal tone throughout; no atrophy noted  Sensory: Pinprick and light touch intact throughout,  bilaterally  Deep Tendon Reflexes:  Depressed throught  Plantars:  Mute bilaterally   Lab Results: No results found for this basename: cbc, bmp, coags, chol, tri, ldl, hga1c   Lipid Panel No results found for this basename: CHOL, TRIG, HDL, CHOLHDL, VLDL, LDLCALC,  in the last 72 hours  Studies/Results: No results found.  MEDICATIONS                                                                                                                        Scheduled: . amoxicillin  1,000 mg Oral q morning - 10a   And  . amoxicillin  500 mg Oral QPM  . atorvastatin  10 mg Oral q1800  . carbamazepine  250 mg Oral TID  . clobazam  5 mg Oral TID  . lacosamide  50 mg Oral BID   And  . lacosamide  100 mg Oral QPM  . levETIRAcetam  1,500 mg Oral BID  . levothyroxine  50 mcg Oral QAC breakfast  . loratadine  10 mg Oral Daily  . multivitamin with minerals  1 tablet Oral Daily  . potassium chloride SA  20 mEq Oral QID  . potassium phosphate (monobasic)  500 mg Oral QID  . sodium chloride  3 mL Intravenous Q12H  . sodium chloride  3 mL Intravenous Q12H  . Topiramate ER  200 mg Oral QHS   And  . Topiramate ER  100 mg Oral QHS  . warfarin  5 mg Oral ONCE-1800  . 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 No further seizures over night. Will continue her usual anticonvulsant regimen for now.  Ativan IV and phenobarbital IV in case of seizures. Will S/O.  Please call with further questions.   Recommend: 1) Follow up  with Dr. Sharene Skeans as a out patient.  2) Continue current AED regime  Assessment and plan discussed with with attending physician and they are in agreement.    Felicie Morn PA-C [facility] [phone]  08/04/2013, 3:13 PM

## 2013-08-04 NOTE — Discharge Summary (Signed)
Physician Discharge Summary  [name] ZOX:[id number] DOB: September 06, 1986 DOA: 08/02/2013  PCP: Eartha Inch, MD  Admit date: 08/02/2013 Discharge date: 08/04/2013  Time spent: > 35 minutes  Recommendations for Outpatient Follow-up:  1. Please followup with neurologist in one to 2 weeks  Discharge Diagnoses:  Principal Problem:   SEIZURE DISORDER Active Problems:   Brown's syndrome   DVT of upper extremity (deep vein thrombosis)   A-fib   Hypothyroidism   Chronic anticoagulation   Subtherapeutic international normalized ratio (INR)   Hypokalemia   Discharge Condition: Stable  Diet recommendation: Low-sodium heart healthy  Filed Weights   08/02/13 1322  Weight: 50.349 kg (111 lb)    History of present illness:  Is a 27 year old male with a history of seizure disorder, hypothyroidism, mental disability, brown syndrome, history of atrophic fibrillation, history of pulmonary embolism presents emergency room with his mother after having seizure.    Hospital Course:  Seizure disorder  Neurologist on board and recommended continuing current AED regimen. Patient is currently on carbamazepine,Vimpat, Keppra, and topiramate - Pt to f/u with his neurologist  Brown's syndrome  Stable   History of atrial fibrillation with a history of PE  INR therapeutic at 2.0  Coumadin to continue at home regimen with routine INR checks.   Left maxillofacial trauma  Secondary to fall presumed to be  secondary to breakthrough seizure. CT of the head and face was negative for fracture   ?Upper Respiratory Infection  Will continue amoxicillin on discharge.   Hyperlipidemia  Continue statin.   Hypokalemia  -Will plan on continuing home regimen   Procedures:  Please refer to EMR  Consultations:  Neurology  Discharge Exam: Filed Vitals:   08/04/13 1542  BP: 96/61  Pulse: 76  Temp: 97.3 F (36.3 C)  Resp: 20    General: Patient in no acute distress,  nontoxic Cardiovascular: Regular rate and rhythm, no rubs Respiratory: Clear to auscultation, no increased work of breathing  Discharge Instructions  Discharge Orders   Future Appointments Provider Department Dept Phone   08/15/2013 11:30 AM [facility] [place] Office [phone]   Future Orders Complete By Expires   Diet - low sodium heart healthy  As directed    Discharge instructions  As directed    Comments:     Please be sure to remove port after use. Please follow up with your primary care physician for further recommendations.   Increase activity slowly  As directed        Medication List         amoxicillin 875 MG tablet  Commonly known as:  AMOXIL  Take 875 mg by mouth 2 (two) times daily.     carbamazepine 100 MG chewable tablet  Commonly known as:  TEGRETOL  Chew 2.5 tablets (250 mg total) by mouth 3 (three) times daily.     cetirizine 10 MG tablet  Commonly known as:  ZYRTEC  Take 10 mg by mouth daily.     diazepam 2 MG tablet  Commonly known as:  VALIUM  Take 2 mg by mouth every 6 (six) hours as needed (for seizures).     guaiFENesin-codeine 100-10 MG/5ML syrup  Commonly known as:  ROBITUSSIN AC  Take 5 mLs by mouth 4 (four) times daily as needed for cough.     lacosamide 50 MG Tabs tablet  Commonly known as:  VIMPAT  Take 1 tablet (50 mg total) by mouth 2 (two) times daily.  Lacosamide 100 MG Tabs  Take 1 tablet (100 mg total) by mouth every evening.     levETIRAcetam 750 MG tablet  Commonly known as:  KEPPRA  Take 2 tablets (1,500 mg total) by mouth 2 (two) times daily.     levothyroxine 50 MCG tablet  Commonly known as:  SYNTHROID, LEVOTHROID  Take 50 mcg by mouth every morning.     LORazepam 2 MG/ML concentrated solution  Commonly known as:  ATIVAN  Inject 2 mg into the vein every 8 (eight) hours as needed for seizure (mixed with 1ml NS, injected into Port). It would be given by IV     multivitamin with  minerals Tabs tablet  Take 1 tablet by mouth daily.     ondansetron 8 MG disintegrating tablet  Commonly known as:  ZOFRAN ODT  Take 1 tablet (8 mg total) by mouth every 8 (eight) hours as needed for nausea.     [name] 10 MG tablet  Generic drug:  clobazam  Take 5 mg by mouth 3 times daily     phenobarbital 60 MG/ML injection  Commonly known as:  LUMINAL  Inject 65 mg into the vein daily as needed (for seizures, mixed with 4ml NS into Port). For seizures     potassium chloride SA 20 MEQ tablet  Commonly known as:  K-DUR,KLOR-CON  Take 20 mEq by mouth 4 (four) times daily.     potassium phosphate (monobasic) 500 MG tablet  Commonly known as:  K-PHOS ORIGINAL  Take 1 tablet (500 mg total) by mouth 4 (four) times daily.     rosuvastatin 5 MG tablet  Commonly known as:  CRESTOR  Take 5 mg by mouth daily.     Topiramate ER 200 MG Cp24  Take 200 mg by mouth at bedtime.     Topiramate ER 50 MG Cp24  Take 100 mg by mouth at bedtime.     warfarin 5 MG tablet  Commonly known as:  COUMADIN  Take 5-7.5 mg by mouth daily. Take 7.5mg  on Tues. Take 5mg  all other days       Allergies  Allergen Reactions  . Divalproex Sodium Other (See Comments)    Nausea, vomiting, liver and kidney dysfunction  . Phenytoin Rash    Hypersensitivity  reaction  . Vancomycin Rash    Redman's Syndrome      The results of significant diagnostics from this hospitalization (including imaging, microbiology, ancillary and laboratory) are listed below for reference.    Significant Diagnostic Studies: Dg Chest 2 View  08/02/2013   CLINICAL DATA:  Seizures  EXAM: CHEST  2 VIEW  COMPARISON:  Prior chest x-ray 02/27/2013  FINDINGS: Stable position of right subclavian approach single-lumen port catheter. Catheter tip projects the superior cavoatrial junction. Left cervical neural stimulator also in unchanged position. Cardiac and mediastinal contours are unchanged. Inspiratory volumes are lower and there is  mild bibasilar right-greater-than-left atelectasis. No acute osseous abnormality. No pneumothorax or pleural effusion.  IMPRESSION: Low inspiratory volumes with mild right worse than left bibasilar atelectasis. Given the history of recent multiple seizures, trace aspiration in the right base would be difficult to exclude.  Otherwise, stable chest x-ray without acute abnormality.   Electronically Signed   By: Malachy Moan M.D.   On: 08/02/2013 15:00   Ct Head Wo Contrast  08/02/2013   CLINICAL DATA:  Seizure. Fall from chair.  EXAM: CT HEAD WITHOUT CONTRAST  CT MAXILLOFACIAL WITHOUT CONTRAST  CT CERVICAL SPINE WITHOUT CONTRAST  TECHNIQUE: Multidetector  CT imaging of the head, cervical spine, and maxillofacial structures were performed using the standard protocol without intravenous contrast. Multiplanar CT image reconstructions of the cervical spine and maxillofacial structures were also generated.  COMPARISON:  05/12/2013 head CT. 02/22/2013 brain MR. 09/01/2011 CT of the head, cervical spine and face.  FINDINGS: CT HEAD FINDINGS  No skull fracture or intracranial hemorrhage.  Prominent basal ganglia calcifications unchanged (described with metabolic abnormalities).  No CT evidence of large acute infarct.  Cerebellar tonsils minimally low lying but within the range of normal limits.  No intracranial mass lesion noted on this unenhanced exam.  Prominent paranasal sinus opacification with air-fluid level left maxillary sinus.  Partial opacification mastoid air cells without evidence of fracture.  CT MAXILLOFACIAL FINDINGS  Paranasal sinus mucosal thickening. No facial fracture noted.  CT CERVICAL SPINE FINDINGS  Motion degraded examination. No cervical spine fracture detected.  Scoliosis and kyphosis cervical spine.  Stimulating device left neck.  IMPRESSION: No skull fracture or intracranial hemorrhage.  Paranasal sinus mucosal thickening.  No facial fracture noted.  Motion degraded examination. No cervical  spine fracture detected.  Scoliosis and kyphosis cervical spine.  Stimulating device left neck.   Electronically Signed   By: Bridgett Larsson M.D.   On: 08/02/2013 15:32   Ct Cervical Spine Wo Contrast  08/02/2013   CLINICAL DATA:  Seizure. Fall from chair.  EXAM: CT HEAD WITHOUT CONTRAST  CT MAXILLOFACIAL WITHOUT CONTRAST  CT CERVICAL SPINE WITHOUT CONTRAST  TECHNIQUE: Multidetector CT imaging of the head, cervical spine, and maxillofacial structures were performed using the standard protocol without intravenous contrast. Multiplanar CT image reconstructions of the cervical spine and maxillofacial structures were also generated.  COMPARISON:  05/12/2013 head CT. 02/22/2013 brain MR. 09/01/2011 CT of the head, cervical spine and face.  FINDINGS: CT HEAD FINDINGS  No skull fracture or intracranial hemorrhage.  Prominent basal ganglia calcifications unchanged (described with metabolic abnormalities).  No CT evidence of large acute infarct.  Cerebellar tonsils minimally low lying but within the range of normal limits.  No intracranial mass lesion noted on this unenhanced exam.  Prominent paranasal sinus opacification with air-fluid level left maxillary sinus.  Partial opacification mastoid air cells without evidence of fracture.  CT MAXILLOFACIAL FINDINGS  Paranasal sinus mucosal thickening. No facial fracture noted.  CT CERVICAL SPINE FINDINGS  Motion degraded examination. No cervical spine fracture detected.  Scoliosis and kyphosis cervical spine.  Stimulating device left neck.  IMPRESSION: No skull fracture or intracranial hemorrhage.  Paranasal sinus mucosal thickening.  No facial fracture noted.  Motion degraded examination. No cervical spine fracture detected.  Scoliosis and kyphosis cervical spine.  Stimulating device left neck.   Electronically Signed   By: Bridgett Larsson M.D.   On: 08/02/2013 15:32   Ct Maxillofacial Wo Cm  08/02/2013   CLINICAL DATA:  Seizure. Fall from chair.  EXAM: CT HEAD WITHOUT  CONTRAST  CT MAXILLOFACIAL WITHOUT CONTRAST  CT CERVICAL SPINE WITHOUT CONTRAST  TECHNIQUE: Multidetector CT imaging of the head, cervical spine, and maxillofacial structures were performed using the standard protocol without intravenous contrast. Multiplanar CT image reconstructions of the cervical spine and maxillofacial structures were also generated.  COMPARISON:  05/12/2013 head CT. 02/22/2013 brain MR. 09/01/2011 CT of the head, cervical spine and face.  FINDINGS: CT HEAD FINDINGS  No skull fracture or intracranial hemorrhage.  Prominent basal ganglia calcifications unchanged (described with metabolic abnormalities).  No CT evidence of large acute infarct.  Cerebellar tonsils minimally low lying but within  the range of normal limits.  No intracranial mass lesion noted on this unenhanced exam.  Prominent paranasal sinus opacification with air-fluid level left maxillary sinus.  Partial opacification mastoid air cells without evidence of fracture.  CT MAXILLOFACIAL FINDINGS  Paranasal sinus mucosal thickening. No facial fracture noted.  CT CERVICAL SPINE FINDINGS  Motion degraded examination. No cervical spine fracture detected.  Scoliosis and kyphosis cervical spine.  Stimulating device left neck.  IMPRESSION: No skull fracture or intracranial hemorrhage.  Paranasal sinus mucosal thickening.  No facial fracture noted.  Motion degraded examination. No cervical spine fracture detected.  Scoliosis and kyphosis cervical spine.  Stimulating device left neck.   Electronically Signed   By: Bridgett Larsson M.D.   On: 08/02/2013 15:32    Microbiology: No results found for this or any previous visit (from the past 240 hour(s)).   Labs: Basic Metabolic Panel:  Recent Labs Lab 08/02/13 1354 08/03/13 0355 08/03/13 2105 08/04/13 0530  NA 137 138  --   --   K 3.5 3.4*  --  3.5  CL 106 106  --   --   CO2 21 20  --   --   GLUCOSE 92 95  --   --   BUN 6 10  --   --   CREATININE 0.71 0.75  --   --   CALCIUM 8.5  7.8*  --   --   MG  --   --  2.0  --    Liver Function Tests: No results found for this basename: AST, ALT, ALKPHOS, BILITOT, PROT, ALBUMIN,  in the last 168 hours No results found for this basename: LIPASE, AMYLASE,  in the last 168 hours No results found for this basename: AMMONIA,  in the last 168 hours CBC:  Recent Labs Lab 08/02/13 1354 08/03/13 0355  WBC 4.4 4.5  NEUTROABS 2.8  --   HGB 12.4* 11.6*  HCT 36.4* 35.1*  MCV 86.1 86.0  PLT 198 207   Cardiac Enzymes: No results found for this basename: CKTOTAL, CKMB, CKMBINDEX, TROPONINI,  in the last 168 hours BNP: BNP (last 3 results) No results found for this basename: PROBNP,  in the last 8760 hours CBG:  Recent Labs Lab 08/02/13 1328  GLUCAP 78       Signed:  Penny Pia  [facility] 08/04/2013, 6:23 PM

## 2013-08-08 ENCOUNTER — Other Ambulatory Visit: Payer: Self-pay | Admitting: Family

## 2013-08-16 ENCOUNTER — Ambulatory Visit (INDEPENDENT_AMBULATORY_CARE_PROVIDER_SITE_OTHER): Payer: Medicaid Other | Admitting: General Practice

## 2013-08-16 DIAGNOSIS — Z7901 Long term (current) use of anticoagulants: Secondary | ICD-10-CM

## 2013-08-16 DIAGNOSIS — I82629 Acute embolism and thrombosis of deep veins of unspecified upper extremity: Secondary | ICD-10-CM

## 2013-09-14 ENCOUNTER — Other Ambulatory Visit: Payer: Self-pay | Admitting: Family

## 2013-09-14 DIAGNOSIS — G40319 Generalized idiopathic epilepsy and epileptic syndromes, intractable, without status epilepticus: Secondary | ICD-10-CM

## 2013-09-14 DIAGNOSIS — G40119 Localization-related (focal) (partial) symptomatic epilepsy and epileptic syndromes with simple partial seizures, intractable, without status epilepticus: Secondary | ICD-10-CM

## 2013-09-14 DIAGNOSIS — G40109 Localization-related (focal) (partial) symptomatic epilepsy and epileptic syndromes with simple partial seizures, not intractable, without status epilepticus: Secondary | ICD-10-CM

## 2013-09-14 DIAGNOSIS — G40309 Generalized idiopathic epilepsy and epileptic syndromes, not intractable, without status epilepticus: Secondary | ICD-10-CM

## 2013-09-22 DIAGNOSIS — I8290 Acute embolism and thrombosis of unspecified vein: Secondary | ICD-10-CM

## 2013-09-22 HISTORY — DX: Acute embolism and thrombosis of unspecified vein: I82.90

## 2013-09-23 ENCOUNTER — Other Ambulatory Visit: Payer: Self-pay | Admitting: Family

## 2013-09-26 ENCOUNTER — Other Ambulatory Visit: Payer: Self-pay | Admitting: Pharmacist

## 2013-09-26 ENCOUNTER — Ambulatory Visit (INDEPENDENT_AMBULATORY_CARE_PROVIDER_SITE_OTHER): Payer: Medicaid Other | Admitting: Pharmacist

## 2013-09-26 DIAGNOSIS — Z7901 Long term (current) use of anticoagulants: Secondary | ICD-10-CM

## 2013-09-26 DIAGNOSIS — I82629 Acute embolism and thrombosis of deep veins of unspecified upper extremity: Secondary | ICD-10-CM

## 2013-09-26 LAB — POCT INR: INR: 1.7

## 2013-09-26 NOTE — Telephone Encounter (Signed)
Erroneous

## 2013-09-27 ENCOUNTER — Telehealth: Payer: Self-pay

## 2013-09-27 DIAGNOSIS — G40119 Localization-related (focal) (partial) symptomatic epilepsy and epileptic syndromes with simple partial seizures, intractable, without status epilepticus: Secondary | ICD-10-CM

## 2013-09-27 MED ORDER — TOPIRAMATE ER 200 MG PO CAP24: 200.0000 mg | ORAL_CAPSULE | Freq: Every day | ORAL | Status: DC

## 2013-09-27 MED ORDER — TOPIRAMATE ER 50 MG PO CAP24: 100.0000 mg | ORAL_CAPSULE | Freq: Every day | ORAL | Status: DC

## 2013-09-27 NOTE — Telephone Encounter (Signed)
I called pharmacy and he has been getting brand name on both [facility] Rx's.

## 2013-10-04 ENCOUNTER — Other Ambulatory Visit: Payer: Self-pay | Admitting: Family

## 2013-10-10 ENCOUNTER — Ambulatory Visit (INDEPENDENT_AMBULATORY_CARE_PROVIDER_SITE_OTHER): Payer: Medicaid Other | Admitting: *Deleted

## 2013-10-10 DIAGNOSIS — Z7901 Long term (current) use of anticoagulants: Secondary | ICD-10-CM

## 2013-10-10 DIAGNOSIS — I82629 Acute embolism and thrombosis of deep veins of unspecified upper extremity: Secondary | ICD-10-CM

## 2013-10-10 LAB — POCT INR: INR: 2.7

## 2013-10-13 ENCOUNTER — Other Ambulatory Visit: Payer: Self-pay

## 2013-10-13 DIAGNOSIS — G40401 Other generalized epilepsy and epileptic syndromes, not intractable, with status epilepticus: Secondary | ICD-10-CM

## 2013-10-13 DIAGNOSIS — G40319 Generalized idiopathic epilepsy and epileptic syndromes, intractable, without status epilepticus: Secondary | ICD-10-CM

## 2013-10-13 MED ORDER — PHENOBARBITAL SODIUM 65 MG/ML IJ SOLN: INTRAMUSCULAR | Status: DC

## 2013-10-13 MED ORDER — LORAZEPAM 2 MG/ML PO CONC: ORAL | Status: DC

## 2013-10-24 ENCOUNTER — Ambulatory Visit (INDEPENDENT_AMBULATORY_CARE_PROVIDER_SITE_OTHER): Payer: Medicaid Other | Admitting: *Deleted

## 2013-10-24 DIAGNOSIS — Z5181 Encounter for therapeutic drug level monitoring: Secondary | ICD-10-CM

## 2013-10-24 DIAGNOSIS — I82629 Acute embolism and thrombosis of deep veins of unspecified upper extremity: Secondary | ICD-10-CM

## 2013-10-24 DIAGNOSIS — Z7901 Long term (current) use of anticoagulants: Secondary | ICD-10-CM

## 2013-10-24 LAB — POCT INR: INR: 2.2

## 2013-10-26 ENCOUNTER — Encounter: Payer: Self-pay | Admitting: Pediatrics

## 2013-10-26 ENCOUNTER — Ambulatory Visit (INDEPENDENT_AMBULATORY_CARE_PROVIDER_SITE_OTHER): Payer: Medicaid Other | Admitting: Pediatrics

## 2013-10-26 VITALS — BP 112/70 | HR 84 | Ht 62.5 in | Wt 111.0 lb

## 2013-10-26 DIAGNOSIS — G40109 Localization-related (focal) (partial) symptomatic epilepsy and epileptic syndromes with simple partial seizures, not intractable, without status epilepticus: Secondary | ICD-10-CM

## 2013-10-26 DIAGNOSIS — G40309 Generalized idiopathic epilepsy and epileptic syndromes, not intractable, without status epilepticus: Secondary | ICD-10-CM

## 2013-10-26 DIAGNOSIS — G40119 Localization-related (focal) (partial) symptomatic epilepsy and epileptic syndromes with simple partial seizures, intractable, without status epilepticus: Secondary | ICD-10-CM

## 2013-10-26 DIAGNOSIS — G40319 Generalized idiopathic epilepsy and epileptic syndromes, intractable, without status epilepticus: Secondary | ICD-10-CM

## 2013-10-26 DIAGNOSIS — I749 Embolism and thrombosis of unspecified artery: Secondary | ICD-10-CM

## 2013-10-26 DIAGNOSIS — E876 Hypokalemia: Secondary | ICD-10-CM

## 2013-10-26 MED ORDER — POTASSIUM CHLORIDE CRYS ER 20 MEQ PO TBCR: EXTENDED_RELEASE_TABLET | ORAL | Status: DC

## 2013-10-26 MED ORDER — KEPPRA 500 MG PO TABS: ORAL_TABLET | ORAL | Status: DC

## 2013-10-26 MED ORDER — POTASSIUM PHOSPHATE MONOBASIC 500 MG PO TABS: 500.0000 mg | ORAL_TABLET | Freq: Four times a day (QID) | ORAL | Status: DC

## 2013-10-26 MED ORDER — LACOSAMIDE 100 MG PO TABS: 100.0000 mg | ORAL_TABLET | Freq: Every evening | ORAL | Status: DC

## 2013-10-26 MED ORDER — LACOSAMIDE 50 MG PO TABS: 50.0000 mg | ORAL_TABLET | Freq: Two times a day (BID) | ORAL | Status: DC

## 2013-10-26 MED ORDER — WARFARIN SODIUM 5 MG PO TABS: ORAL_TABLET | ORAL | Status: DC

## 2013-10-26 NOTE — Progress Notes (Addendum)
Patient: [name] MRN: [id number] Sex: male DOB: 1986-09-15  Provider: Jodi Geralds, MD Location of Care: [facility]  Note type: Routine return visit  History of Present Illness: Referral Source: [name], M.D. History from: mother and sibling, patient and [facility] chart Chief Complaint: Seizures  [name] is a 28 y.o. male who returns for evaluation and management of intractable simple partial seizures, and generalized tonic-clonic seizures with apnea, and global developmental delay.  The patient returns on October 26, 2013, for the first time since July 04, 2013.  He has simple and complex partial seizures with secondary generalization, which began when he was [age] years of age.  His seizures have been extremely difficult to control and he is on polypharmacy and a vagal nerve stimulator.  Fortunately, he seems to be less impaired with his medication now than he has been in previous times.  He has a cluster seizures every two to three weeks, which involve either generalized tonic-clonic seizures followed by somatosensory seizures or the other way around.  Apnea is also involved.  On his last cluster, he had seizures that were intermittently present for three days.  His family kept the port open and used rescue medication daily.  His general health has been good.  He continues to have swelling in his face from problems with venous return.  He has not had any bleeding problems caused by the use of warfarin.  He has not recently had to be hospitalized or come to emergency room for care, although he had one hospitalization from November 11 through 13 when he was hospitalized for recurrent seizures.  I provided assistance from the office.  I interrogated his vagal nerve stimulator, which is described below.   Procedure: Interrogation and reprogramming of Vagal Nerve Stimulator.  Implantation of the current stimulator 102 R on December 09, 2005 serial number  [id number]  Interrogation of the vagal nerve stimulator: Voltage 2.25 mA. Frequency 20 Hz, pulse width 250 microseconds, signal time on 7 seconds, signal time off 30 seconds, magnet current 2.50 mA, signal time on 60 seconds, pulse width 250 microseconds. The normal mode diagnostics using his current settings was also normal, the DC-DC conversion code was 3. There is no indication to change the battery. Total of 1123 stimuli, 5 since August 2014.  He tolerated the procedure well.  Review of Systems: 12 system review was remarkable for cough, snoring, allergies, runny nose, headache and seizure  Past Medical History  Diagnosis Date  . ALLERGIC RHINITIS 12/26/2009  . MITRAL VALVE PROLAPSE 06/22/2007  . SEIZURE DISORDER 06/22/2007  . Unspecified hypothyroidism 06/22/2007  . Growth disorder   . Mental retardation, mild (I.Q. 50-70)   . Brown's syndrome   . Heart murmur     moderate MR  . Sleep apnea     cpap , last sleep study 10/01/11  . Hypokalemia   . Atrial fibrillation     During port removal and attempted placement in July 2013, transientt  . Hyperlipidemia     No therapy  . Blood clot in vein    Hospitalizations: yes, Head Injury: no, Nervous System Infections: no, Immunizations up to date: yes Past Medical History Comments: Patient has been hospitalized many times due to seizure activity.  Diagnosis of developmental delay as a toddler. He was a breech presentation, cesarean section delivery. He had intrauterine growth retardation, and failure to thrive. EEG 11/07/86 was normal for gestational age. Genetics evaluation short stature, prenatal onset, chromosomal study: 60 XY,  fragile X syndrome negative, evaluation for MELAS and MERRF were negative.   Initial seizure 08/28/94 left locomotor with secondary generalization.   MRI of the brain 09/13/93: Scattered subcortical white matter lesions at the parietotemporal parieto-occipital junction is ischemic versus hamartomas.   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   Diagnosis attention deficit disorder inattentive type treated without success with Cylert April 1995  MRI brain 04/04/94 stable differential diagnosis hamartoma, vasculitis, ischemic, or embolic phenomenon.   status epilepticus 01/31/95, and 04/15/95 Neurontin added to Tegretol   MRI brain 06/14/96 unchanged   admission to [facility]. Tegretol 30 mcg for militate. 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   Topiramate started in May 1999 unable to be tolerated with Tegretol and was tapered and discontinued.   January 2000 EEG showed right greater than left mid temporal sharp waves was otherwise well-organized EEG. He hospitalizations in March, July, October, in November for recurrent seizures.   Patient placed on Depakote in April 2000 and developed gagging abdominal pain headache and had significant change in transaminases and liver functions. Topamax was restarted and Keppra was added.   Vimpat was started July 16, 2012 and adjusted upwards.   [name] was added on November 18, 2012 and has been adjusted upwards. Topamax was changed to Trokendi XR in attempt to deal with sleepiness unsteadiness caused by polypharmacy. We to these medications have been introduced, the patient experiences somewhat improved seizure control however Is quite likely that despite better seizure control, the patient is experiencing impairment in cognition and gait as a result of polypharmacy.   MRI of the brain on March 21, 2013. The patient has extensive  calcification to the basal ganglia bilaterally, normal ventricle size, and no acute findings. EKG was unchanged.    Behavior History none  Surgical History Past Surgical History  Procedure Laterality Date  . Implantation vagal nerve stimulator      Left  . Portacath placement      and removal 04/08/12  . Eye surgery      X2 for Brown Syndrome    Family History family history includes Asthma in his father; Breast cancer in his mother; Coronary artery disease in his maternal grandfather and paternal grandfather; Diabetes in his father; Lung cancer in his paternal grandfather; Pneumonia in his paternal grandmother; Stroke in his paternal grandmother. Family History is negative migraines, seizures, cognitive impairment, blindness, deafness, birth defects, chromosomal disorder, autism.  Social History History   Social History  . Marital Status: Single    Spouse Name: N/A    Number of Children: 0  . Years of Education: N/A   Occupational History  . Disabled    Social History Main Topics  . Smoking status: Never Smoker   . Smokeless tobacco: Never Used  . Alcohol Use: No  . Drug Use: No  . Sexual Activity: No   Other Topics Concern  . None   Social History Narrative  . None   Educational level 12th grade Living with both parents, older sister is a major caregiver Hobbies/Interest: Enjoys watching sports  School comments Yaasir graduated with a certificate  from [employer] in 2012.   Current Outpatient Prescriptions on File Prior to Visit  Medication Sig Dispense Refill  . carbamazepine (TEGRETOL) 100 MG chewable tablet Chew 2.5 tablets (250 mg total) by mouth 3 (three) times daily.  233 tablet  5  . cetirizine (ZYRTEC) 10 MG tablet Take 10 mg by mouth daily.      . diazepam (VALIUM) 2 MG tablet Take 2 mg by mouth every 6 (six) hours as needed (for seizures).      Marland Kitchen guaiFENesin-codeine (ROBITUSSIN AC) 100-10 MG/5ML syrup Take 5 mLs by mouth 4 (four) times daily as  needed for cough.      Marland Kitchen KEPPRA 500 MG tablet TAKE THREE TABLETS BY MOUTH TWICE DAILY  186 tablet  2  . KLOR-CON M20 20 MEQ tablet TAKE ONE TABLET BY MOUTH 4 TIMES DAILY.  124 tablet  1  . lacosamide (VIMPAT) 50 MG TABS tablet Take 1 tablet (50 mg total) by mouth 2 (two) times daily.  60 tablet  0  . lacosamide 100 MG TABS Take 1 tablet (100 mg total) by mouth every evening.  30 tablet  0  . levETIRAcetam (KEPPRA) 750 MG tablet Take 2 tablets (1,500 mg total) by mouth 2 (two) times daily.  60 tablet  0  . levothyroxine (SYNTHROID, LEVOTHROID) 50 MCG tablet Take 50 mcg by mouth every morning.       Marland Kitchen LORazepam (ATIVAN) 2 MG/ML concentrated solution Withdraw 46ml (2mg ) and 8ml  Normal saline into 3 ml syringe. Administer IV push over 3-5 minutes as needed for recurrent seizures  5 mL  5  . Multiple Vitamin (MULTIVITAMIN WITH MINERALS) TABS Take 1 tablet by mouth daily.      . [name] 10 MG tablet TAKE ONE-HALF TABLET BY MOUTH THREE TIMES DAILY  45 tablet  3  . PHENObarbital (LUMINAL) 65 MG/ML injection Withdraw 82ml (65mg ) Phenobarbital and 55ml Normal Saline into 4ml syringe. Adminster IV push for 3-5 minutes as needed for recurrent seizures  5 mL  5  . potassium chloride SA (K-DUR,KLOR-CON) 20 MEQ tablet Take 20 mEq by mouth 4 (four) times daily.      . potassium phosphate, monobasic, (K-PHOS ORIGINAL) 500 MG tablet Take 1 tablet (500 mg total) by mouth 4 (four) times daily.  124 tablet  5  . Topiramate ER 200 MG CP24 Take 200 mg by mouth at bedtime. Brand Name Medically Necessary  60 capsule  3  . warfarin (COUMADIN) 5 MG tablet Take 5-7.5 mg by mouth daily. Take 7.5mg  on Wed. And Sat. Take 5mg  all other days.      Marland Kitchen amoxicillin (AMOXIL) 875 MG tablet Take 875 mg by mouth 2 (two) times daily.      . ondansetron (ZOFRAN ODT) 8 MG disintegrating tablet Take 1 tablet (8 mg total) by mouth every 8 (eight) hours as needed for nausea.  10 tablet  0  . rosuvastatin (CRESTOR) 5 MG tablet Take 5 mg by mouth  daily.      . Topiramate ER 50 MG CP24 Take 100 mg by mouth at bedtime. Brand Name Medically Necessary  30 capsule  3  . warfarin (COUMADIN) 5 MG tablet TAKE 1 AND 1/2 TABLET BY MOUTH ON MONDAY, WEDNESDAY, AND FRIDAY AND TAKE 1 TABLET DAILY ON ALL OTHER DAYS  40 tablet  3   No current facility-administered medications on file prior to visit.   The medication list was reviewed and reconciled. All changes or newly prescribed medications were explained.  A complete medication list was provided to the patient/caregiver.  Allergies  Allergen Reactions  . Divalproex Sodium Other (See Comments)    Nausea, vomiting, liver and kidney dysfunction  . Phenytoin Rash    Hypersensitivity  reaction  . Vancomycin Rash    Redman's Syndrome    Physical Exam BP 112/70  Pulse 84  Ht 5' 2.5" (1.588 m)  Wt 111 lb (50.349 kg)  BMI 19.97 kg/m2  General: thin young man, seated on the examination table, in no distress, right-handed, sandy hair, blue/brown eyes; Awake, alert, animated, talkative.  Head: head microcephalic and atraumatic, His head is slightly turned with the right ear toward his right shoulder and chin points the left. He does not have torticollis. Left face and neck are swollen.  Ears, Nose and Throat: beak-like nose. He has a low lying palate. Wax occluding external auditory canals  Neck: supple with no carotid or supraclavicular bruits.  Respiratory: Lungs are clear to auscultation  Cardiovascular: regular rate and rhythm, soft holosystolic murmur at left sternal border. His nailbeds are pink.  Musculoskeletal: short stature with head tilted to the right. He has tight heel cords.  Skin: no rashes or lesions.  Trunk: no scoliosis. His portacath in the right upper chest. He has a vagal nerve stimulator in the left upper chest.   Neurologic Exam   Mental Status: Awake, alert. Oriented to place and time. Attention span, concentration, and fund of knowledge appropriate for him. He has  evidence of mild cognitive impairment. Mood and affect appropriate. 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 Pupils equal, briskly reactive to light. Extraocular movements full but dysconjugate. Visual fields full to confrontation. Hearing intact to air conduction greater than bone conduction. Facial sensation intact. Face, tongue, palate move normally and symmetrically. Neck flexion and extension normal. 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 He had a slight tic-like movement of his mouth.  Motor: Diminished bulk and normal tone. The patient has diffuse mild weakness in his proximal and distal muscles in the 4-4+ over 5 range.He had normal strength in his legs. He has clumsy fine motor movements. His hands are semiflexed at the distal joints at rest but he can extend them. Sensory: Intact to light touch,cold, vibration, and proprioception in all extremities; good stereognosis  Coordination: Rapid alternating movements are clumsy in all extremities. Finger-to-nose and heel-to-shin performed accurately bilaterally. Romberg negative.  Gait and Station: Right foot is in a somewhat valgus position, but the heel strike for both feet is good. His gait is slightly broad-based.  Reflexes: Absent and symmetric. Toes downgoing.  Assessment 1. 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 2. 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 3. Hypokalemia. 4. Thromboembolism.   Plan: I refilled his prescriptions for levetiracetam, 100 mg lacosamide, potassium chloride, potassium phosphate, and warfarin.  I interrogated his vagal nerve stimulator and it is working well.    I spent 30 minutes of face-to-face time with the family, more than half in consultation.  Jodi Geralds MD

## 2013-11-18 ENCOUNTER — Other Ambulatory Visit: Payer: Self-pay | Admitting: Family

## 2013-11-24 ENCOUNTER — Other Ambulatory Visit: Payer: Self-pay | Admitting: Family

## 2013-11-25 ENCOUNTER — Other Ambulatory Visit: Payer: Self-pay | Admitting: Family

## 2013-11-28 ENCOUNTER — Ambulatory Visit (INDEPENDENT_AMBULATORY_CARE_PROVIDER_SITE_OTHER): Payer: Medicaid Other | Admitting: Pharmacist

## 2013-11-28 DIAGNOSIS — Z5181 Encounter for therapeutic drug level monitoring: Secondary | ICD-10-CM

## 2013-11-28 DIAGNOSIS — I82629 Acute embolism and thrombosis of deep veins of unspecified upper extremity: Secondary | ICD-10-CM

## 2013-11-28 DIAGNOSIS — Z7901 Long term (current) use of anticoagulants: Secondary | ICD-10-CM

## 2013-11-28 LAB — POCT INR: INR: 1.5

## 2013-12-12 ENCOUNTER — Ambulatory Visit (INDEPENDENT_AMBULATORY_CARE_PROVIDER_SITE_OTHER): Payer: Medicaid Other | Admitting: Pharmacist

## 2013-12-12 DIAGNOSIS — Z5181 Encounter for therapeutic drug level monitoring: Secondary | ICD-10-CM

## 2013-12-12 DIAGNOSIS — Z7901 Long term (current) use of anticoagulants: Secondary | ICD-10-CM

## 2013-12-12 DIAGNOSIS — I82629 Acute embolism and thrombosis of deep veins of unspecified upper extremity: Secondary | ICD-10-CM

## 2013-12-12 LAB — POCT INR: INR: 2.1

## 2013-12-24 ENCOUNTER — Other Ambulatory Visit: Payer: Self-pay | Admitting: Pediatrics

## 2013-12-27 ENCOUNTER — Telehealth: Payer: Self-pay

## 2013-12-27 MED ORDER — VIMPAT 50 MG PO TABS: ORAL_TABLET | ORAL | Status: DC

## 2013-12-27 NOTE — Telephone Encounter (Signed)
[facility] on [address] [facility] stating they rcv'd Rx on 10/26/13 for Vimpat 50 mg 1 tab po BID. Family said that he takes 1 in the am, 1 at noon and 2 in the evening. If this is correct, then they are going to need a new rx sent to them.

## 2013-12-27 NOTE — Telephone Encounter (Signed)
I called and talked to Mom. She has been giving Vimpat 50mg  1 AM, 1 afternoon and  2 PM. She would rather do that because of limitations on number of Rx's by Medicaid. She wants the Vimpat 100mg  Rx that was on his file to take at [facility] cancelled. I sent in new Rx for Vimpat 50mg  as described. I called pharmacy and told them to cancel Vimpat 100mg  Rx on file. TG

## 2014-01-05 ENCOUNTER — Ambulatory Visit (INDEPENDENT_AMBULATORY_CARE_PROVIDER_SITE_OTHER): Payer: Medicaid Other | Admitting: Pharmacist Clinician (PhC)/ Clinical Pharmacy Specialist

## 2014-01-05 DIAGNOSIS — I82629 Acute embolism and thrombosis of deep veins of unspecified upper extremity: Secondary | ICD-10-CM

## 2014-01-05 DIAGNOSIS — Z7901 Long term (current) use of anticoagulants: Secondary | ICD-10-CM

## 2014-01-05 DIAGNOSIS — Z5181 Encounter for therapeutic drug level monitoring: Secondary | ICD-10-CM

## 2014-01-05 LAB — POCT INR: INR: 1.8

## 2014-01-23 ENCOUNTER — Other Ambulatory Visit: Payer: Self-pay | Admitting: Pediatrics

## 2014-01-24 ENCOUNTER — Other Ambulatory Visit: Payer: Self-pay | Admitting: Family

## 2014-01-26 ENCOUNTER — Ambulatory Visit (INDEPENDENT_AMBULATORY_CARE_PROVIDER_SITE_OTHER): Payer: Medicaid Other | Admitting: Pharmacist Clinician (PhC)/ Clinical Pharmacy Specialist

## 2014-01-26 DIAGNOSIS — I82629 Acute embolism and thrombosis of deep veins of unspecified upper extremity: Secondary | ICD-10-CM

## 2014-01-26 DIAGNOSIS — Z5181 Encounter for therapeutic drug level monitoring: Secondary | ICD-10-CM

## 2014-01-26 DIAGNOSIS — Z7901 Long term (current) use of anticoagulants: Secondary | ICD-10-CM

## 2014-01-26 LAB — POCT INR: INR: 1.8

## 2014-02-13 ENCOUNTER — Other Ambulatory Visit: Payer: Self-pay | Admitting: Family

## 2014-02-16 ENCOUNTER — Ambulatory Visit (INDEPENDENT_AMBULATORY_CARE_PROVIDER_SITE_OTHER): Payer: Medicaid Other

## 2014-02-16 DIAGNOSIS — I82629 Acute embolism and thrombosis of deep veins of unspecified upper extremity: Secondary | ICD-10-CM

## 2014-02-16 DIAGNOSIS — Z7901 Long term (current) use of anticoagulants: Secondary | ICD-10-CM

## 2014-02-16 DIAGNOSIS — Z5181 Encounter for therapeutic drug level monitoring: Secondary | ICD-10-CM

## 2014-02-16 LAB — POCT INR: INR: 1.6

## 2014-03-02 ENCOUNTER — Ambulatory Visit (INDEPENDENT_AMBULATORY_CARE_PROVIDER_SITE_OTHER): Payer: Medicaid Other | Admitting: *Deleted

## 2014-03-02 DIAGNOSIS — Z5181 Encounter for therapeutic drug level monitoring: Secondary | ICD-10-CM

## 2014-03-02 DIAGNOSIS — I82629 Acute embolism and thrombosis of deep veins of unspecified upper extremity: Secondary | ICD-10-CM

## 2014-03-02 DIAGNOSIS — Z7901 Long term (current) use of anticoagulants: Secondary | ICD-10-CM

## 2014-03-02 LAB — POCT INR: INR: 3

## 2014-03-17 ENCOUNTER — Ambulatory Visit (INDEPENDENT_AMBULATORY_CARE_PROVIDER_SITE_OTHER): Payer: Medicaid Other

## 2014-03-17 DIAGNOSIS — I82629 Acute embolism and thrombosis of deep veins of unspecified upper extremity: Secondary | ICD-10-CM

## 2014-03-17 DIAGNOSIS — Z7901 Long term (current) use of anticoagulants: Secondary | ICD-10-CM

## 2014-03-17 DIAGNOSIS — Z5181 Encounter for therapeutic drug level monitoring: Secondary | ICD-10-CM

## 2014-03-17 LAB — POCT INR: INR: 2

## 2014-03-28 ENCOUNTER — Ambulatory Visit (INDEPENDENT_AMBULATORY_CARE_PROVIDER_SITE_OTHER): Payer: Medicaid Other | Admitting: Pediatrics

## 2014-03-28 ENCOUNTER — Encounter: Payer: Self-pay | Admitting: Pediatrics

## 2014-03-28 VITALS — BP 94/70 | HR 84 | Ht 62.5 in | Wt 111.8 lb

## 2014-03-28 DIAGNOSIS — I749 Embolism and thrombosis of unspecified artery: Secondary | ICD-10-CM

## 2014-03-28 DIAGNOSIS — F70 Mild intellectual disabilities: Secondary | ICD-10-CM

## 2014-03-28 DIAGNOSIS — G40119 Localization-related (focal) (partial) symptomatic epilepsy and epileptic syndromes with simple partial seizures, intractable, without status epilepticus: Secondary | ICD-10-CM

## 2014-03-28 DIAGNOSIS — G40319 Generalized idiopathic epilepsy and epileptic syndromes, intractable, without status epilepticus: Secondary | ICD-10-CM

## 2014-03-28 MED ORDER — KEPPRA 500 MG PO TABS: ORAL_TABLET | ORAL | Status: DC

## 2014-03-28 MED ORDER — TROKENDI XR 50 MG PO CP24: ORAL_CAPSULE | ORAL | Status: DC

## 2014-03-28 MED ORDER — TROKENDI XR 200 MG PO CP24: ORAL_CAPSULE | ORAL | Status: DC

## 2014-03-28 MED ORDER — WARFARIN SODIUM 5 MG PO TABS: ORAL_TABLET | ORAL | Status: DC

## 2014-03-28 MED ORDER — CARBAMAZEPINE 100 MG PO CHEW: CHEWABLE_TABLET | ORAL | Status: DC

## 2014-03-28 NOTE — Progress Notes (Signed)
Patient: [name] MRN: [id number] Sex: male DOB: Jul 17, 1986  Provider: Jodi Geralds, MD Location of Care: [facility]  Note type: Routine return visit  History of Present Illness: Referral Source: Dr. Irena Reichmann  History from: mother, patient and [facility] chart Chief Complaint: Seizures/VNS Re-check   [name] is a 28 y.o. 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  [name] returns on March 28, 2014 for the first time since October 26, 2013.  He has simple and complex partial seizures with secondary generalization that began at [age] years of age.  His past medical history is recorded below.  I interrogated his vagal nerve stimulator, which shows that it is near the end of service.  It is not clear to me how long he has before is stops functioning.  I reprogrammed the device to decrease the frequency of stimuli from 1 every 30 seconds to 1 every 66 seconds.  I made no other changes in parameters.  Hopefully, this will not resolve in increasing frequency of seizures.  His mother continues to be worried about the swelling in his neck.  There is nothing we can do about that and in fact we will have to discontinue his Coumadin in order to replace his vagal nerve stimulator. I probably will not restart it.  He requires CPAP in order to sleep at nighttime.  His seizures overall have diminished and he is not showing cyanosis with them.  He is averaging about one generalized seizure per week.  Somatosensory seizures have been infrequent.  The duration of his seizures has been anywhere from five to eight minutes.  His mother believes that the vagal nerve stimulator stop one of his events when he began to act strangely and then suddenly came out of it.  I interrogated his vagal nerve stimulator, which is described below.   Procedure:  Interrogation and reprogramming of Vagal Nerve Stimulator.  Implantation of the current stimulator 102 R on December 09, 2005 serial number [id number]  Interrogation of the vagal nerve stimulator: Voltage 2.25 mA. Frequency 20 Hz, pulse width 250 microseconds, signal time on 7 seconds, signal time off 30 seconds, magnet current 2.50 mA, signal time on 60 seconds, pulse width 250 microseconds. Reprogram the stimulator to signal time off 1.1 minutes. The normal mode diagnostics using his current settings revealed the battery requires replacement, the DC-DC conversion code was 3.  I contacted Dr. Jerrilyn Cairo office to set up a preoperative evaluation to replace the device.   He tolerated the procedure well.  Review of Systems: 12 system review was remarkable for seizures  Past Medical History  Diagnosis Date  . ALLERGIC RHINITIS 12/26/2009  . MITRAL VALVE PROLAPSE 06/22/2007  . SEIZURE DISORDER 06/22/2007  . Unspecified hypothyroidism 06/22/2007  . Growth disorder   . Mental retardation, mild (I.Q. 50-70)   . Brown's syndrome   . Heart murmur     moderate MR  . Sleep apnea     cpap , last sleep study 10/01/11  . Hypokalemia   . Atrial fibrillation     During port removal and attempted placement in July 2013, transientt  . Hyperlipidemia     No therapy  . Blood clot in vein    Hospitalizations: No., Head Injury: No., Nervous System Infections: No., Immunizations up to date: Yes.   Past Medical History Diagnosis of developmental delay as a toddler. He was a breech presentation,  cesarean section delivery. He had intrauterine growth retardation, and failure to thrive. EEG 11/07/86 was normal for gestational age. Genetics evaluation short stature, prenatal onset, chromosomal study: 28 XY, fragile X syndrome negative, evaluation for MELAS and MERRF were negative.   Initial seizure 08/28/94 left locomotor with secondary generalization.   MRI of the brain 09/13/93: Scattered subcortical white matter lesions at  the parietotemporal parieto-occipital junction is ischemic versus hamartomas.   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   Diagnosis attention deficit disorder inattentive type treated without success with Cylert April 1995  MRI brain 04/04/94 stable differential diagnosis hamartoma, vasculitis, ischemic, or embolic phenomenon.   status epilepticus 01/31/95, and 04/15/95 Neurontin added to Tegretol   MRI brain 06/14/96 unchanged  admission to [facility]. Tegretol 30 mcg for militate. 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   Topiramate started in May 1999 unable to be tolerated with Tegretol and was tapered and discontinued.   January 2000 EEG showed right greater than left mid temporal sharp waves was otherwise well-organized EEG. He hospitalizations in March, July, October, in November for recurrent seizures.  Patient placed on Depakote in April 2000 and developed gagging abdominal pain headache and had significant change in transaminases and liver functions. Topamax was restarted and Keppra was added.   Vimpat was started July 16, 2012 and adjusted upwards.   [name] was added on November 18, 2012 and has been adjusted upwards. Topamax was changed to Trokendi XR in attempt to deal with sleepiness unsteadiness caused by polypharmacy. We to these medications have been introduced, the patient experiences somewhat improved seizure control however Is quite likely that despite better seizure control, the patient is experiencing impairment in cognition and gait as a  result of polypharmacy.   MRI of the brain on March 21, 2013. The patient has extensive calcification to the basal ganglia bilaterally, normal ventricle size, and no acute findings. EKG was unchanged.   Behavior History none  Surgical History Past Surgical History  Procedure Laterality Date  . Implantation vagal nerve stimulator      Left  . Portacath placement      and removal 04/08/12  . Eye surgery      X2 for Brown Syndrome    Family History family history includes Asthma in his father; Breast cancer in his mother; Coronary artery disease in his maternal grandfather and paternal grandfather; Diabetes in his father; Lung cancer in his paternal grandfather; Pneumonia in his paternal grandmother; Stroke in his paternal grandmother. Family History is negative for migraines, seizures, intellectual disability, blindness, deafness, birth defects, chromosomal disorder, or autism.  Social History History   Social History  . Marital Status: Single    Spouse Name: N/A    Number of Children: 0  . Years of Education: N/A   Occupational History  . Disabled    Social History Main Topics  . Smoking status: Never Smoker   . Smokeless tobacco: Never Used  . Alcohol Use: No  . Drug Use: No  . Sexual Activity: No   Other Topics Concern  . None   Social History Narrative  . None  Educational level 12th grade special education  Living with both parents  Hobbies/Interest: [name] loves Geographical information systems officer and he enjoys a variety of many other sports. He daily household chores that he he responsible for and enjoys doing is loading and unloading the dish washer, empties small trash cans and helps with his laundry. School comments Adyen completed [employer] in [phone] with a certificate of completion.   Current Outpatient Prescriptions on File Prior to Visit  Medication Sig Dispense Refill  . carbamazepine (TEGRETOL) 100 MG chewable tablet CHEW AND SWALLOW TWO AND ONE-HALF TABLETS THREE  TIMES DAILY  233 tablet  3  . cetirizine (ZYRTEC) 10 MG tablet Take 10 mg by mouth daily.      . diazepam (VALIUM) 2 MG tablet Take 2 mg by mouth every 6 (six) hours as needed (for seizures).      Marland Kitchen guaiFENesin-codeine (ROBITUSSIN AC) 100-10 MG/5ML syrup Take 5 mLs by mouth 4 (four) times daily as needed for cough.      Marland Kitchen K-PHOS 500 MG tablet TAKE ONE TABLET BY MOUTH 4 TIMES DAILY  124 tablet  3  . KEPPRA 500 MG tablet Take 3 tablets by mouth twice daily  186 tablet  5  . KLOR-CON M20 20 MEQ tablet TAKE ONE TABLET BY MOUTH 4 TIMES DAILY.  124 tablet  5  . levothyroxine (SYNTHROID, LEVOTHROID) 50 MCG tablet Take 50 mcg by mouth every morning.       Marland Kitchen LORazepam (ATIVAN) 2 MG/ML concentrated solution Withdraw 4ml (2mg ) and 27ml  Normal saline into 3 ml syringe. Administer IV push over 3-5 minutes as needed for recurrent seizures  5 mL  5  . Multiple Vitamin (MULTIVITAMIN WITH MINERALS) TABS Take 1 tablet by mouth daily.      . ondansetron (ZOFRAN ODT) 8 MG disintegrating tablet Take 1 tablet (8 mg total) by mouth every 8 (eight) hours as needed for nausea.  10 tablet  0  . [name] 10 MG tablet TAKE ONE-HALF TABLET BY MOUTH THREE TIMES DAILY.  45 tablet  5  . PHENObarbital (LUMINAL) 65 MG/ML injection Withdraw 7ml (65mg ) Phenobarbital and 41ml Normal Saline into 77ml syringe. Adminster IV push for 3-5 minutes as needed for recurrent seizures  5 mL  5  . potassium chloride SA (KLOR-CON M20) 20 MEQ tablet TAKE ONE TABLET BY MOUTH 4 TIMES DAILY.  124 tablet  5  . potassium phosphate, monobasic, (K-PHOS ORIGINAL) 500 MG tablet Take 1 tablet (500 mg total) by mouth 4 (four) times daily.  124 tablet  5  . TROKENDI XR 50 MG CP24 TAKE TWO CAPSULES BY MOUTH AT BEDTIME  30 capsule  0  . VIMPAT 50 MG TABS tablet Take 1 tablet in the morning, take 1 tablet in the evening and take 2 tablets at night  124 tablet  5  . warfarin (COUMADIN) 5 MG tablet TAKE 1 AND 1/2 TABLET BY MOUTH ON MONDAY, WEDNESDAY, AND FRIDAY AND TAKE 1  TABLET DAILY ON ALL OTHER DAYS  40 tablet  5  . amoxicillin (AMOXIL) 875 MG tablet Take 875 mg by mouth 2 (two) times daily.      Marland Kitchen KLOR-CON M20 20 MEQ tablet TAKE ONE TABLET BY MOUTH 4 TIMES DAILY  124 tablet  5  . rosuvastatin (CRESTOR) 5 MG tablet Take 5 mg by mouth daily.      . Topiramate ER 200 MG CP24 Take 200 mg by mouth at bedtime. Brand Name Medically Necessary  60 capsule  3  .  warfarin (COUMADIN) 5 MG tablet TAKE 1 AND 1/2 TABLET BY MOUTH ON MONDAY, WEDNESDAY ,AND FRIDAY AND 1 TABLET DAILY ON ALL OTHER DAYS  40 tablet  3   No current facility-administered medications on file prior to visit.   The medication list was reviewed and reconciled. All changes or newly prescribed medications were explained.  A complete medication list was provided to the patient/caregiver.  Allergies  Allergen Reactions  . Divalproex Sodium Other (See Comments)    Nausea, vomiting, liver and kidney dysfunction  . Phenytoin Rash    Hypersensitivity  reaction  . Vancomycin Rash    Redman's Syndrome    Physical Exam Ht 5' 2.5" (1.588 m)  Wt 111 lb 12.8 oz (50.712 kg)  BMI 20.11 kg/m2  General: thin young man, seated on the examination table, in no distress, right-handed, sandy hair, blue/brown eyes; Awake, alert, animated, talkative.  Head: head microcephalic and atraumatic, His head is slightly turned with the right ear toward his right shoulder and chin points the left. He does not have torticollis. Left face and neck are swollen.  Ears, Nose and Throat: beak-like nose. He has a low lying palate. Wax occluding external auditory canals  Neck: supple with no carotid or supraclavicular bruits.  Respiratory: Lungs are clear to auscultation  Cardiovascular: regular rate and rhythm, soft holosystolic murmur at left sternal border. His nailbeds are pink.  Musculoskeletal: short stature with head tilted to the right. He has tight heel cords.  Skin: no rashes or lesions.  Trunk: no scoliosis. His  portacath in the right upper chest. He has a vagal nerve stimulator in the left upper chest.   Neurologic Exam   Mental Status: Awake, alert. Oriented to place and time. Attention span, concentration, and fund of knowledge appropriate for him. He has evidence of mild cognitive impairment. Mood and affect appropriate. 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 Pupils equal, briskly reactive to light. Extraocular movements full but dysconjugate. Visual fields full to confrontation. Hearing intact to air conduction greater than bone conduction. Facial sensation intact. Face, tongue, palate move normally and symmetrically. Neck flexion and extension normal. 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 He had a slight tic-like movement of his mouth.  Motor: Diminished bulk and normal tone. The patient has diffuse mild weakness in his proximal and distal muscles in the 4-4+ over 5 range.He had normal strength in his legs. He has clumsy fine motor movements. His hands are semiflexed at the distal joints at rest but he can extend them.  Sensory: Intact to light touch,cold, vibration, and proprioception in all extremities; good stereognosis  Coordination: Rapid alternating movements are clumsy in all extremities. Finger-to-nose and heel-to-shin performed accurately bilaterally. Romberg negative.  Gait and Station: Right foot is in a somewhat valgus position, but the heel strike for both feet is good. His gait is slightly broad-based and was less steady in more ataxic today than on his last visit. Reflexes: Absent and symmetric. Toes downgoing.  Assessment 1. 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 2. 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 3. 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 4. 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  Plan Continue his current medications.  I refilled prescriptions for Keppra, Trokendi, and carbamazepine as well as warfarin.  I also reprogrammed his vagal nerve stimulator and contacted Dr. Pamala Hurry at [facility] to arrange to have  his VNS replaced.  It appears that as soon as this will be possible on April 19, 2014.    I am going to have the family and do get in touch with the [employer] representative, Erlene Quan to see if we can facilitate procedure sooner.  Family is going away for a week.  If it is possible to do it the week of the 20th, we will stop his Coumadin while he is on vacation.  I really do not think there will be any ramifications of this.  I spent 30 minutes of face-to-face time with [name] and his mother more than half of it in consultation.  Jodi Geralds MD

## 2014-03-29 ENCOUNTER — Telehealth: Payer: Self-pay | Admitting: Family

## 2014-03-29 IMAGING — CR DG CHEST 1V PORT
1 series · 1 of 1 positions shown · non-contrast
Comparison: Prior abdominal radiograph including chest x-ray
11/30/2012

CLINICAL DATA: Post CPR

PORTABLE CHEST - 1 VIEW

[AP]
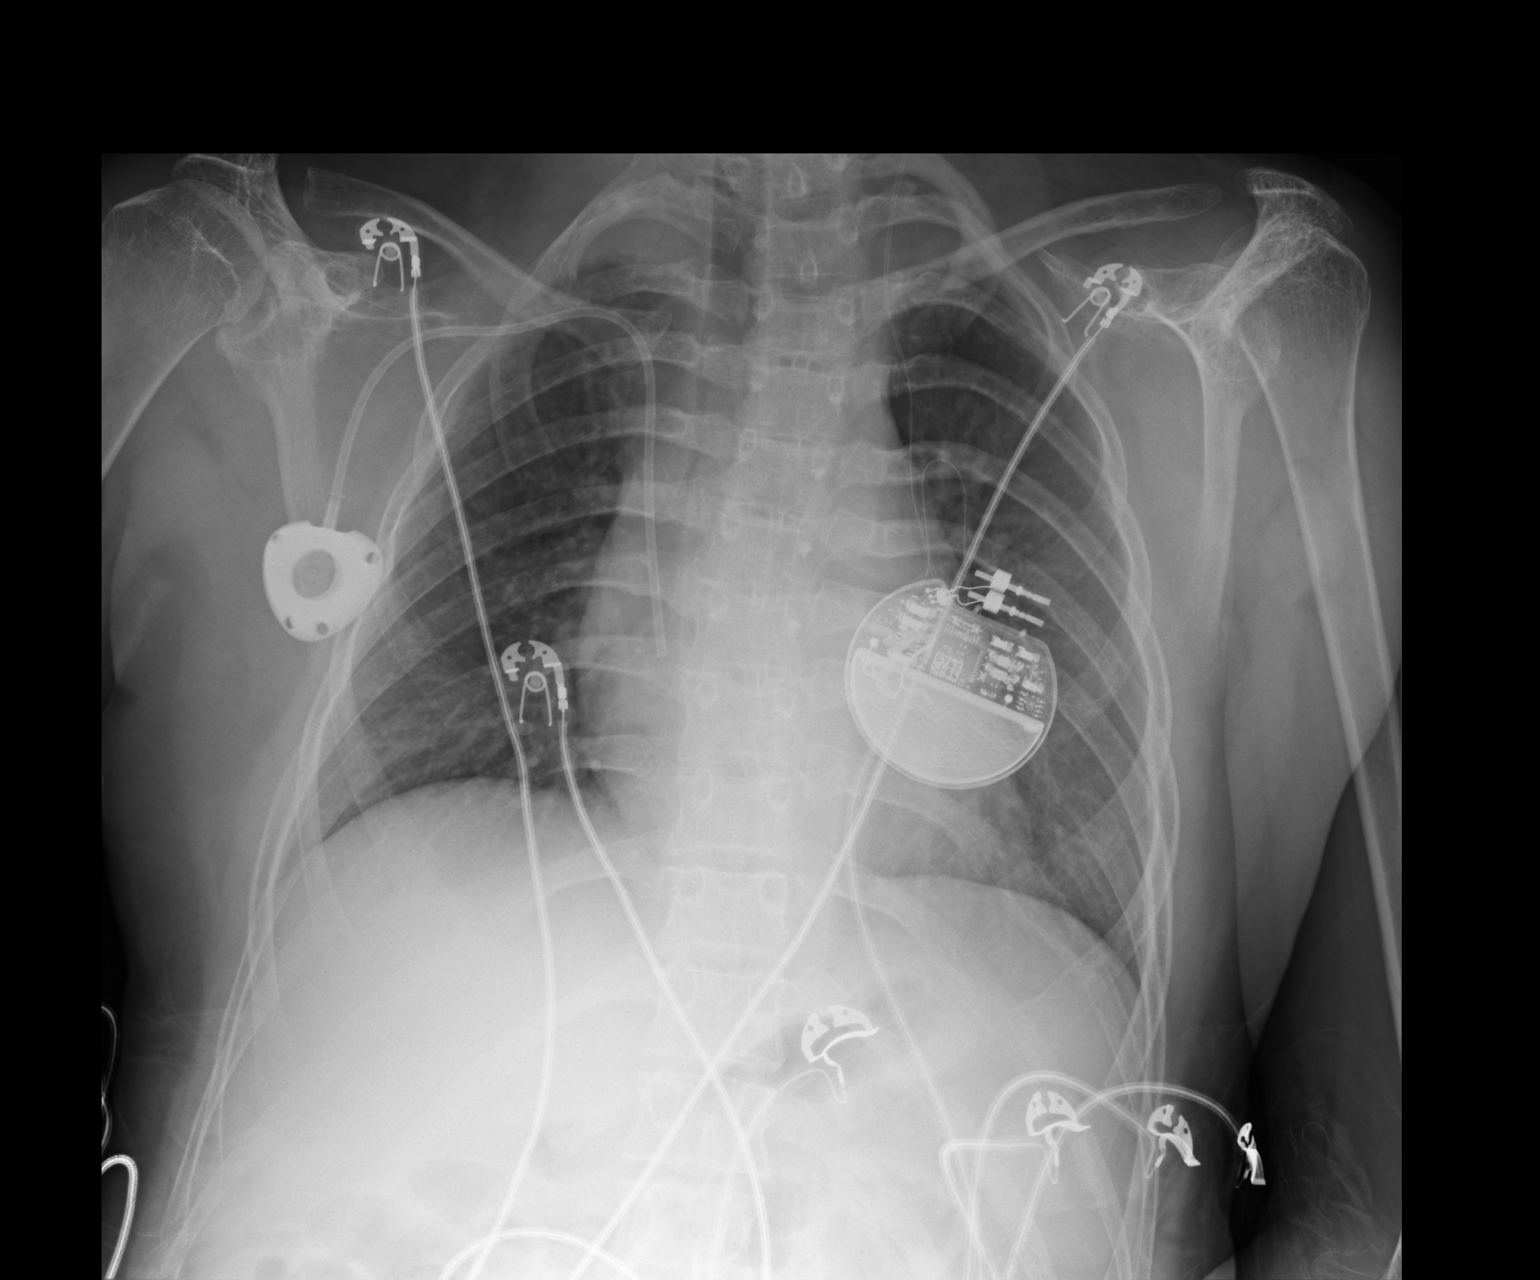

[1 of 1 positions shown; findings below may reference images not displayed]

FINDINGS: Stable position of right subclavian single lumen
portacatheter.  The tip of the catheter projects over the superior
cavoatrial junction.  Left anterior chest neural stimulator also in
unchanged position with the lead extending into the left C7-T1
level.

The lungs are clear.  No pneumothorax, pleural effusion or focal
airspace opacity. Cardiac and mediastinal contours are unchanged.
No acute displaced rib fracture identified.  No acute osseous
abnormality.
IMPRESSION: No acute cardiopulmonary disease.

## 2014-03-29 NOTE — Telephone Encounter (Signed)
Family talked to Dr. Jerrilyn Cairo office today.  He will need to come off Coumadin on July 22. I hope that his seizures remain stable over this time when I have cut his vagal nerve stimulator rate in half.  The family will be back in town July 17.  I don't think he is at risk of a thromboembolic event he take away his Coumadin.

## 2014-03-29 NOTE — Telephone Encounter (Signed)
Mom DJ Koebel called. [name] saw Dr Pamala Hurry today. He will have preop on 23rd surgery and will have surgery on 29th for VNS battery. She was told to call you to ask does he need to be on Heparin for awhile? He was told to off Coumadin 7 days prior to surgery. Mom's number is [phone]. TG

## 2014-03-30 IMAGING — CR DG CHEST 1V PORT
1 series · 1 of 1 positions shown · non-contrast
Comparison: 02/26/2013

CLINICAL DATA: Seizure, possible aspiration pneumonia.

PORTABLE CHEST - 1 VIEW

[AP]
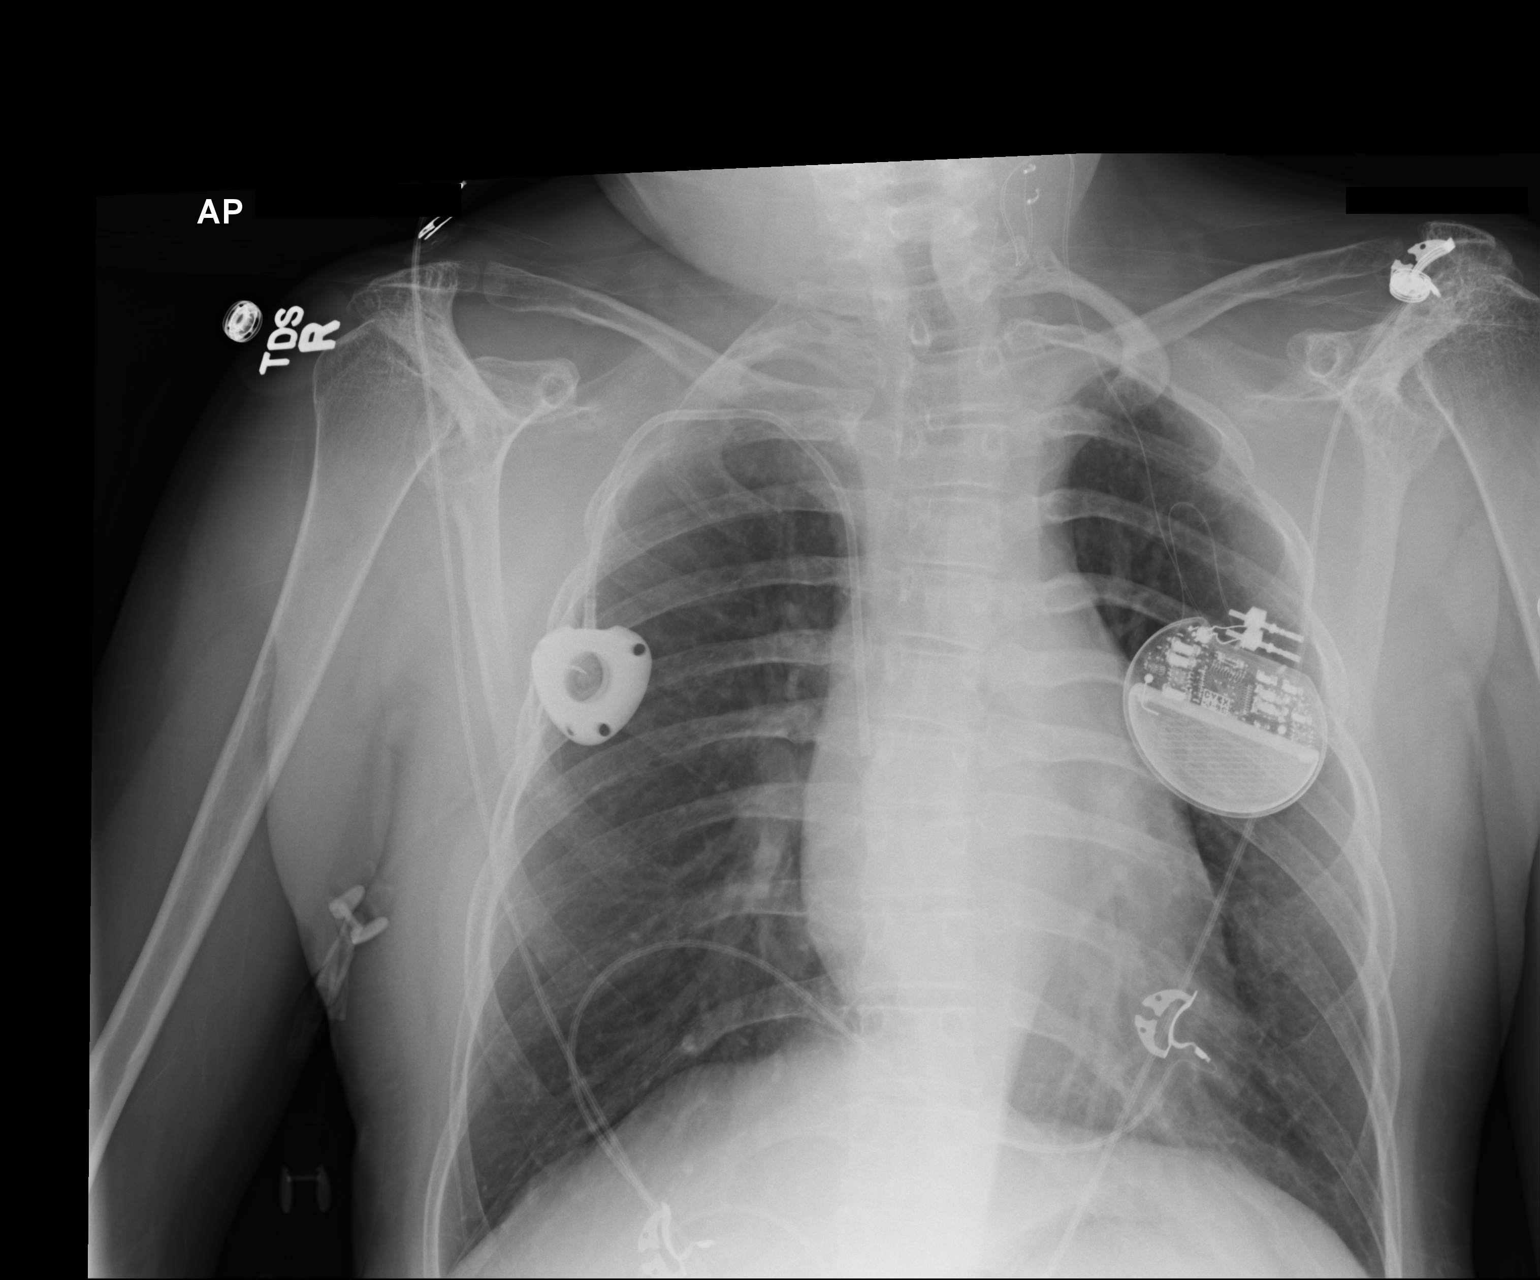

[1 of 1 positions shown; findings below may reference images not displayed]

FINDINGS: Right subclavian [name] unchanged. Left chest neural
stimulator device unchanged.  Lungs are adequately inflated without
focal consolidation or effusion.  The cardiomediastinal silhouette
and remainder of the exam is unchanged.
IMPRESSION: No acute cardiopulmonary disease.

## 2014-04-10 ENCOUNTER — Ambulatory Visit (INDEPENDENT_AMBULATORY_CARE_PROVIDER_SITE_OTHER): Payer: Medicaid Other | Admitting: Pharmacist

## 2014-04-10 DIAGNOSIS — I82629 Acute embolism and thrombosis of deep veins of unspecified upper extremity: Secondary | ICD-10-CM

## 2014-04-10 DIAGNOSIS — Z7901 Long term (current) use of anticoagulants: Secondary | ICD-10-CM

## 2014-04-10 DIAGNOSIS — Z5181 Encounter for therapeutic drug level monitoring: Secondary | ICD-10-CM

## 2014-04-10 LAB — POCT INR: INR: 3.3

## 2014-04-11 ENCOUNTER — Encounter: Payer: Self-pay | Admitting: Cardiovascular Disease

## 2014-04-14 ENCOUNTER — Telehealth: Payer: Self-pay | Admitting: *Deleted

## 2014-04-14 NOTE — Telephone Encounter (Signed)
Butch Penny, the charge nurse at [facility], stated the pt was seen yesterday. She stated that the patient is having surgery next week and needs the most recent clinical notes. Butch Penny is also asking you to provide written instructions for emergency seizure regimen. She said the nurse practioner is needing this. The nurse also stated that she has a written release for information signed if you need it. She can be reached at [phone] and her fax is [phone].

## 2014-04-14 NOTE — Telephone Encounter (Signed)
I sent notes and seizure plan as requested. TG

## 2014-04-21 ENCOUNTER — Emergency Department (HOSPITAL_COMMUNITY)
Admission: EM | Admit: 2014-04-21 | Discharge: 2014-04-22 | Disposition: A | Discharge status: Home / Self Care | Payer: Medicaid Other | Attending: Emergency Medicine | Admitting: Emergency Medicine

## 2014-04-21 ENCOUNTER — Encounter (HOSPITAL_COMMUNITY): Payer: Self-pay | Admitting: Emergency Medicine

## 2014-04-21 DIAGNOSIS — G40909 Epilepsy, unspecified, not intractable, without status epilepticus: Secondary | ICD-10-CM | POA: Diagnosis not present

## 2014-04-21 DIAGNOSIS — R011 Cardiac murmur, unspecified: Secondary | ICD-10-CM | POA: Diagnosis not present

## 2014-04-21 DIAGNOSIS — Z79899 Other long term (current) drug therapy: Secondary | ICD-10-CM | POA: Diagnosis not present

## 2014-04-21 DIAGNOSIS — E039 Hypothyroidism, unspecified: Secondary | ICD-10-CM | POA: Insufficient documentation

## 2014-04-21 DIAGNOSIS — R112 Nausea with vomiting, unspecified: Secondary | ICD-10-CM | POA: Insufficient documentation

## 2014-04-21 DIAGNOSIS — I4891 Unspecified atrial fibrillation: Secondary | ICD-10-CM | POA: Insufficient documentation

## 2014-04-21 DIAGNOSIS — E876 Hypokalemia: Secondary | ICD-10-CM | POA: Diagnosis not present

## 2014-04-21 DIAGNOSIS — R51 Headache: Secondary | ICD-10-CM | POA: Insufficient documentation

## 2014-04-21 LAB — COMPREHENSIVE METABOLIC PANEL
ALBUMIN: 3.3 g/dL — AB (ref 3.5–5.2)
ALT: 16 U/L (ref 0–53)
AST: 18 U/L (ref 0–37)
Alkaline Phosphatase: 89 U/L (ref 39–117)
Anion gap: 14 (ref 5–15)
BUN: 12 mg/dL (ref 6–23)
CALCIUM: 8.9 mg/dL (ref 8.4–10.5)
CO2: 19 meq/L (ref 19–32)
Chloride: 104 mEq/L (ref 96–112)
Creatinine, Ser: 0.79 mg/dL (ref 0.50–1.35)
GFR calc Af Amer: >90 mL/min (ref >90)
GFR calc non Af Amer: >90 mL/min (ref >90)
Glucose, Bld: 104 mg/dL — ABNORMAL HIGH (ref 70–99)
Potassium: 3.5 mEq/L — ABNORMAL LOW (ref 3.7–5.3)
SODIUM: 137 meq/L (ref 137–147)
Total Bilirubin: 0.4 mg/dL (ref 0.3–1.2)
Total Protein: 7.5 g/dL (ref 6.0–8.3)

## 2014-04-21 LAB — URINALYSIS, ROUTINE W REFLEX MICROSCOPIC
Bilirubin Urine: NEGATIVE
GLUCOSE, UA: 250 mg/dL — AB
Ketones, ur: 15 mg/dL — AB
LEUKOCYTES UA: NEGATIVE
Nitrite: NEGATIVE
PH: 6.5 (ref 5.0–8.0)
Protein, ur: NEGATIVE mg/dL
SPECIFIC GRAVITY, URINE: 1.023 (ref 1.005–1.030)
Urobilinogen, UA: 0.2 mg/dL (ref 0.0–1.0)

## 2014-04-21 LAB — CBC WITH DIFFERENTIAL/PLATELET
BASOS ABS: 0 10*3/uL (ref 0.0–0.1)
Basophils Relative: 0 % (ref 0–1)
EOS PCT: 2 % (ref 0–5)
Eosinophils Absolute: 0.1 10*3/uL (ref 0.0–0.7)
HCT: 38.8 % — ABNORMAL LOW (ref 39.0–52.0)
Hemoglobin: 12.9 g/dL — ABNORMAL LOW (ref 13.0–17.0)
Lymphocytes Relative: 29 % (ref 12–46)
Lymphs Abs: 2 10*3/uL (ref 0.7–4.0)
MCH: 29.1 pg (ref 26.0–34.0)
MCHC: 33.2 g/dL (ref 30.0–36.0)
MCV: 87.6 fL (ref 78.0–100.0)
Monocytes Absolute: 0.8 10*3/uL (ref 0.1–1.0)
Monocytes Relative: 11 % (ref 3–12)
NEUTROS ABS: 4 10*3/uL (ref 1.7–7.7)
Neutrophils Relative %: 58 % (ref 43–77)
PLATELETS: 199 10*3/uL (ref 150–400)
RBC: 4.43 MIL/uL (ref 4.22–5.81)
RDW: 13.4 % (ref 11.5–15.5)
WBC: 6.9 10*3/uL (ref 4.0–10.5)

## 2014-04-21 LAB — URINE MICROSCOPIC-ADD ON

## 2014-04-21 LAB — CARBAMAZEPINE LEVEL, TOTAL: Carbamazepine Lvl: 16.4 ug/mL (ref 4.0–12.0)

## 2014-04-21 MED ORDER — ONDANSETRON HCL 4 MG/2ML IJ SOLN
4.0000 mg | Freq: Once | INTRAMUSCULAR | Status: AC
  Administered 2014-04-21: 4 mg via INTRAVENOUS
  Filled 2014-04-21: qty 2

## 2014-04-21 MED ORDER — SODIUM CHLORIDE 0.9 % IV BOLUS (SEPSIS)
500.0000 mL | Freq: Once | INTRAVENOUS | Status: AC
  Administered 2014-04-21: 500 mL via INTRAVENOUS

## 2014-04-21 MED ORDER — ONDANSETRON 8 MG PO TBDP: 8.0000 mg | ORAL_TABLET | Freq: Three times a day (TID) | ORAL | Status: DC | PRN

## 2014-04-21 MED ORDER — SODIUM CHLORIDE 0.9 % IV SOLN
INTRAVENOUS | Status: DC
  Administered 2014-04-21: 22:00:00 via INTRAVENOUS

## 2014-04-21 NOTE — Discharge Instructions (Signed)

## 2014-04-21 NOTE — ED Provider Notes (Signed)
CSN: [id number]     Arrival date & time 04/21/14  1915 History   First MD Initiated Contact with Patient 04/21/14 2131     Chief Complaint  Patient presents with  . Emesis  . Headache     (Consider location/radiation/quality/duration/timing/severity/associated sxs/prior Treatment) HPI  [name] is a 28 y.o. male who is here for evaluation of nausea, vomiting, headache, transient vision loss, and anorexia. He had a vagus nerve stimulator battery replacement done, by outpatient surgery, 3 days ago. Today, he woke up from a nap at about 2:30 he complained of a headache, difficulty seeing, and then began vomiting. He vomited several times, yellow emesis. There's been no diarrhea. His vision, loss has improved within the last couple of hours. At the time of evaluation, his vision has returned to his usual. He had a migraine headache, one year ago with similar vision, loss, and headache. He had a couple of seizures earlier in the week and was treated with extra medication, phenobarbital, and Valium. Otherwise, he is taking his medicines as directed. There are no other known modifying factors.   Past Medical History  Diagnosis Date  . ALLERGIC RHINITIS 12/26/2009  . MITRAL VALVE PROLAPSE 06/22/2007  . SEIZURE DISORDER 06/22/2007  . Unspecified hypothyroidism 06/22/2007  . Growth disorder   . Mental retardation, mild (I.Q. 50-70)   . Brown's syndrome   . Heart murmur     moderate MR  . Sleep apnea     cpap , last sleep study 10/01/11  . Hypokalemia   . Atrial fibrillation     During port removal and attempted placement in July 2013, transientt  . Hyperlipidemia     No therapy  . Blood clot in vein    Past Surgical History  Procedure Laterality Date  . Implantation vagal nerve stimulator      Left  . Portacath placement      and removal 04/08/12  . Eye surgery      X2 for Brown Syndrome   Family History  Problem Relation Age of Onset  . Breast cancer Mother   . Asthma  Father   . Diabetes Father   . Coronary artery disease Maternal Grandfather   . Coronary artery disease Paternal Grandfather   . Lung cancer Paternal Grandfather     Died at 63  . Pneumonia Paternal Grandmother     Died at 14  . Stroke Paternal Grandmother   . Congestive Heart Failure Maternal Grandfather     Died at 66  . Pneumonia Maternal Grandfather    History  Substance Use Topics  . Smoking status: Never Smoker   . Smokeless tobacco: Never Used  . Alcohol Use: No    Review of Systems  All other systems reviewed and are negative.     Allergies  Antihistamines, chlorpheniramine-type; Divalproex sodium; Phenytoin; Valproic acid and related; and Vancomycin  Home Medications   Prior to Admission medications   Medication Sig Start Date End Date Taking? Authorizing Provider  acetaminophen (TYLENOL) 500 MG tablet Take 500 mg by mouth every 6 (six) hours as needed for moderate pain.   Yes Historical Provider, MD  cetirizine (ZYRTEC) 10 MG tablet Take 10 mg by mouth daily.   Yes Historical Provider, MD  cloBAZam ([name]) 10 MG tablet Take 5 mg by mouth 3 (three) times daily.   Yes Historical Provider, MD  diazepam (VALIUM) 2 MG tablet Take 2 mg by mouth every 6 (six) hours as needed (for seizures).  Yes Historical Provider, MD  lacosamide (VIMPAT) 50 MG TABS tablet Take 50-100 mg by mouth 3 (three) times daily. Takes 1 tab in am, 1 tab in pm and 2 tabs at bedtime   Yes Historical Provider, MD  levETIRAcetam (KEPPRA) 500 MG tablet Take 1,500 mg by mouth 2 (two) times daily.   Yes Historical Provider, MD  levothyroxine (SYNTHROID, LEVOTHROID) 50 MCG tablet Take 50 mcg by mouth every morning.    Yes Historical Provider, MD  LORazepam (ATIVAN) 2 MG/ML concentrated solution Withdraw 84ml (2mg ) and 41ml  Normal saline into 3 ml syringe. Administer IV push over 3-5 minutes as needed for recurrent seizures 10/13/13  Yes Rockwell Germany, NP  Multiple Vitamin (MULTIVITAMIN WITH MINERALS) TABS  Take 1 tablet by mouth daily.   Yes Historical Provider, MD  PHENObarbital (LUMINAL) 65 MG/ML injection Withdraw 57ml (65mg ) Phenobarbital and 95ml Normal Saline into 42ml syringe. [place]minster IV push for 3-5 minutes as needed for recurrent seizures 10/13/13  Yes Rockwell Germany, NP  potassium chloride SA (K-DUR,KLOR-CON) 20 MEQ tablet Take 20 mEq by mouth 4 (four) times daily.   Yes Historical Provider, MD  potassium phosphate, monobasic, (K-PHOS ORIGINAL) 500 MG tablet Take 1 tablet (500 mg total) by mouth 4 (four) times daily. 10/26/13  Yes Jodi Geralds, MD  Topiramate ER (TROKENDI XR) 200 MG CP24 Take 200 mg by mouth at bedtime.   Yes Historical Provider, MD  Topiramate ER (TROKENDI XR) 50 MG CP24 Take 100 mg by mouth at bedtime.   Yes Historical Provider, MD  ondansetron (ZOFRAN ODT) 8 MG disintegrating tablet Take 1 tablet (8 mg total) by mouth every 8 (eight) hours as needed for nausea or vomiting. 04/21/14   Richarda Blade, MD  oxycodone (OXY-IR) 5 MG capsule Take 5 mg by mouth every 4 (four) hours as needed for pain.     Historical Provider, MD   BP 104/75  Pulse 80  Temp(Src) 97.8 F (36.6 C)  Resp 20  SpO2 100% Physical Exam  Nursing note and vitals reviewed. Constitutional: He appears well-developed. No distress.  He is alert and responsive. He cooperates with efforts for physical examination  HENT:  Head: Normocephalic and atraumatic.  Right Ear: External ear normal.  Left Ear: External ear normal.  Eyes: Conjunctivae and EOM are normal. Pupils are equal, round, and reactive to light.  Neck: Normal range of motion and phonation normal. Neck supple.  Cardiovascular: Normal rate, regular rhythm, normal heart sounds and intact distal pulses.   Pulmonary/Chest: Effort normal and breath sounds normal. He exhibits no bony tenderness.  Abdominal: Soft. He exhibits no mass. There is tenderness (Diffuse, mild). There is no rebound and no guarding.  Musculoskeletal: Normal range of  motion.  Neurological: He is alert. No cranial nerve deficit or sensory deficit. He exhibits normal muscle tone. Coordination normal.  Skin: Skin is warm, dry and intact.  Psychiatric: He has a normal mood and affect. His behavior is normal.    ED Course  Procedures (including critical care time)  Medications  0.9 %  sodium chloride infusion ( Intravenous Stopped 04/22/14 0000)  ondansetron (ZOFRAN) injection 4 mg (4 mg Intravenous Given 04/21/14 2012)  sodium chloride 0.9 % bolus 500 mL (0 mLs Intravenous Stopped 04/21/14 2212)  ondansetron (ZOFRAN-ODT) disintegrating tablet 4 mg (4 mg Oral Given 04/22/14 0006)    Patient Vitals for the past 24 hrs:  BP Temp Pulse Resp SpO2  04/21/14 2352 104/75 mmHg - 80 20 100 %  04/21/14  2330 100/57 mmHg - 76 23 97 %  04/21/14 2200 102/63 mmHg - 80 23 100 %  04/21/14 2100 94/64 mmHg - 78 26 100 %  04/21/14 2001 106/64 mmHg - - 18 99 %  04/21/14 1920 100/61 mmHg 97.8 F (36.6 C) 89 18 100 %    At discharge- Reevaluation with update and discussion. After initial assessment and treatment, an updated evaluation reveals . He is comfortable, no more vomiting, he is able to see the TV. And described what is on it. Findings discussed with parents, questions answered. [place] Review Labs Reviewed  CBC WITH DIFFERENTIAL - Abnormal; Notable for the following:    Hemoglobin 12.9 (*)    HCT 38.8 (*)    All other components within normal limits  COMPREHENSIVE METABOLIC PANEL - Abnormal; Notable for the following:    Potassium 3.5 (*)    Glucose, Bld 104 (*)    Albumin 3.3 (*)    All other components within normal limits  CARBAMAZEPINE LEVEL, TOTAL - Abnormal; Notable for the following:    Carbamazepine Lvl 16.4 (*)    All other components within normal limits  URINALYSIS, ROUTINE W REFLEX MICROSCOPIC - Abnormal; Notable for the following:    Glucose, UA 250 (*)    Hgb urine dipstick TRACE (*)    Ketones, ur 15 (*)    All other components  within normal limits  URINE MICROSCOPIC-ADD ON    Imaging Review No results found.   EKG Interpretation None      MDM   Final diagnoses:  Non-intractable vomiting with nausea, vomiting of unspecified type    Nausea and vomiting related to mild toxicity of Tegretol, this is likely accidental elevation. No evidence for metabolic instability, serious bacterial infection or ongoing acute seizure problems.  Nursing Notes Reviewed/ Care Coordinated Applicable Imaging Reviewed Interpretation of Laboratory Data incorporated into ED treatment  The patient appears reasonably screened and/or stabilized for discharge and I doubt any other medical condition or other [facility] requiring further screening, evaluation, or treatment in the ED at this time prior to discharge.  Plan: Home Medications- hold Tegretol until for at least 3 days; Home Treatments- gradually advance diet; return here if the recommended treatment, does not improve the symptoms; Recommended follow up- contact his neurologist tomorrow to discuss when to start taking Tegretol, again     Richarda Blade, MD 04/22/14 [phone]

## 2014-04-21 NOTE — ED Notes (Signed)
MD notified of critical tegretol level 16.4

## 2014-04-21 NOTE — ED Notes (Signed)
Pt. reports multiple emesis with headache and blurred vision onset this afternoon , family called Dr. Gaynell Face and was advised to go to ER for evaluation . Denies diarrhea or fever . Recent surgery - vagal nerve stimulator battery change at [facility] last Wednesday.

## 2014-04-21 NOTE — ED Notes (Signed)
Dr. Wentz at bedside. 

## 2014-04-21 NOTE — ED Notes (Signed)
Pt and family aware urine specimen is needed.

## 2014-04-21 NOTE — ED Notes (Signed)
Lab called to add on blood work 

## 2014-04-22 MED ORDER — ONDANSETRON 4 MG PO TBDP
4.0000 mg | ORAL_TABLET | Freq: Once | ORAL | Status: AC
  Administered 2014-04-22: 4 mg via ORAL
  Filled 2014-04-22: qty 1

## 2014-04-24 ENCOUNTER — Telehealth: Payer: Self-pay | Admitting: Family

## 2014-04-24 DIAGNOSIS — G40319 Generalized idiopathic epilepsy and epileptic syndromes, intractable, without status epilepticus: Secondary | ICD-10-CM

## 2014-04-24 DIAGNOSIS — G40119 Localization-related (focal) (partial) symptomatic epilepsy and epileptic syndromes with simple partial seizures, intractable, without status epilepticus: Secondary | ICD-10-CM

## 2014-04-24 DIAGNOSIS — G40219 Localization-related (focal) (partial) symptomatic epilepsy and epileptic syndromes with complex partial seizures, intractable, without status epilepticus: Secondary | ICD-10-CM

## 2014-04-24 NOTE — Telephone Encounter (Signed)
I called instructions to Mom and faxed orders to [facility]. TG

## 2014-04-24 NOTE — Telephone Encounter (Signed)
I received a fax from [facility] that [name] was seen in the ER on 04/22/14 for migraine & vomiting. He had elevated Tegretol level at the time and Mom spoke with Dr Gaynell Face about that. She said that [name] was better, in terms of migraine, but was still very tired, and she wondered if Dr Gaynell Face wanted her to take [name] to have blood levels rechecked. Mom can be reached at [phone]. TG

## 2014-04-24 NOTE — Telephone Encounter (Signed)
Yes I would like a morning trough carbamazepine level to make certain that it is not toxic.  I ordered it, please arrange it with the family.

## 2014-04-25 ENCOUNTER — Encounter (HOSPITAL_COMMUNITY): Payer: Self-pay | Admitting: Emergency Medicine

## 2014-04-25 ENCOUNTER — Emergency Department (HOSPITAL_COMMUNITY): Payer: Medicaid Other

## 2014-04-25 ENCOUNTER — Observation Stay (HOSPITAL_COMMUNITY)
Admission: EM | Admit: 2014-04-25 | Discharge: 2014-04-26 | DRG: 101 | Disposition: A | Discharge status: Home / Self Care | Payer: Medicaid Other | Attending: Internal Medicine | Admitting: Internal Medicine

## 2014-04-25 DIAGNOSIS — Z801 Family history of malignant neoplasm of trachea, bronchus and lung: Secondary | ICD-10-CM | POA: Diagnosis not present

## 2014-04-25 DIAGNOSIS — G4733 Obstructive sleep apnea (adult) (pediatric): Secondary | ICD-10-CM | POA: Diagnosis present

## 2014-04-25 DIAGNOSIS — E039 Hypothyroidism, unspecified: Secondary | ICD-10-CM | POA: Diagnosis present

## 2014-04-25 DIAGNOSIS — I059 Rheumatic mitral valve disease, unspecified: Secondary | ICD-10-CM | POA: Diagnosis present

## 2014-04-25 DIAGNOSIS — I4891 Unspecified atrial fibrillation: Secondary | ICD-10-CM | POA: Diagnosis present

## 2014-04-25 DIAGNOSIS — R569 Unspecified convulsions: Secondary | ICD-10-CM | POA: Diagnosis present

## 2014-04-25 DIAGNOSIS — G40804 Other epilepsy, intractable, without status epilepticus: Secondary | ICD-10-CM | POA: Diagnosis not present

## 2014-04-25 DIAGNOSIS — I499 Cardiac arrhythmia, unspecified: Secondary | ICD-10-CM

## 2014-04-25 DIAGNOSIS — Z79899 Other long term (current) drug therapy: Secondary | ICD-10-CM

## 2014-04-25 DIAGNOSIS — Z8249 Family history of ischemic heart disease and other diseases of the circulatory system: Secondary | ICD-10-CM | POA: Diagnosis not present

## 2014-04-25 DIAGNOSIS — R197 Diarrhea, unspecified: Secondary | ICD-10-CM

## 2014-04-25 DIAGNOSIS — Z888 Allergy status to other drugs, medicaments and biological substances status: Secondary | ICD-10-CM | POA: Diagnosis not present

## 2014-04-25 DIAGNOSIS — R634 Abnormal weight loss: Secondary | ICD-10-CM

## 2014-04-25 DIAGNOSIS — E876 Hypokalemia: Secondary | ICD-10-CM | POA: Diagnosis present

## 2014-04-25 DIAGNOSIS — J309 Allergic rhinitis, unspecified: Secondary | ICD-10-CM

## 2014-04-25 DIAGNOSIS — Z803 Family history of malignant neoplasm of breast: Secondary | ICD-10-CM | POA: Diagnosis not present

## 2014-04-25 DIAGNOSIS — G40209 Localization-related (focal) (partial) symptomatic epilepsy and epileptic syndromes with complex partial seizures, not intractable, without status epilepticus: Secondary | ICD-10-CM

## 2014-04-25 DIAGNOSIS — Z823 Family history of stroke: Secondary | ICD-10-CM

## 2014-04-25 DIAGNOSIS — F70 Mild intellectual disabilities: Secondary | ICD-10-CM | POA: Diagnosis present

## 2014-04-25 DIAGNOSIS — R269 Unspecified abnormalities of gait and mobility: Secondary | ICD-10-CM

## 2014-04-25 DIAGNOSIS — Z833 Family history of diabetes mellitus: Secondary | ICD-10-CM

## 2014-04-25 DIAGNOSIS — M216X9 Other acquired deformities of unspecified foot: Secondary | ICD-10-CM

## 2014-04-25 DIAGNOSIS — Z881 Allergy status to other antibiotic agents status: Secondary | ICD-10-CM | POA: Diagnosis not present

## 2014-04-25 DIAGNOSIS — G40319 Generalized idiopathic epilepsy and epileptic syndromes, intractable, without status epilepticus: Secondary | ICD-10-CM

## 2014-04-25 DIAGNOSIS — I340 Nonrheumatic mitral (valve) insufficiency: Secondary | ICD-10-CM

## 2014-04-25 DIAGNOSIS — T82598A Other mechanical complication of other cardiac and vascular devices and implants, initial encounter: Secondary | ICD-10-CM

## 2014-04-25 DIAGNOSIS — Z7901 Long term (current) use of anticoagulants: Secondary | ICD-10-CM

## 2014-04-25 DIAGNOSIS — R791 Abnormal coagulation profile: Secondary | ICD-10-CM

## 2014-04-25 DIAGNOSIS — Z825 Family history of asthma and other chronic lower respiratory diseases: Secondary | ICD-10-CM

## 2014-04-25 DIAGNOSIS — E038 Other specified hypothyroidism: Secondary | ICD-10-CM

## 2014-04-25 DIAGNOSIS — E785 Hyperlipidemia, unspecified: Secondary | ICD-10-CM | POA: Diagnosis present

## 2014-04-25 DIAGNOSIS — G40901 Epilepsy, unspecified, not intractable, with status epilepticus: Secondary | ICD-10-CM

## 2014-04-25 DIAGNOSIS — I480 Paroxysmal atrial fibrillation: Secondary | ICD-10-CM

## 2014-04-25 DIAGNOSIS — G40119 Localization-related (focal) (partial) symptomatic epilepsy and epileptic syndromes with simple partial seizures, intractable, without status epilepticus: Secondary | ICD-10-CM

## 2014-04-25 DIAGNOSIS — G40309 Generalized idiopathic epilepsy and epileptic syndromes, not intractable, without status epilepticus: Secondary | ICD-10-CM

## 2014-04-25 DIAGNOSIS — Z5181 Encounter for therapeutic drug level monitoring: Secondary | ICD-10-CM

## 2014-04-25 DIAGNOSIS — H5060 Mechanical strabismus, unspecified: Secondary | ICD-10-CM

## 2014-04-25 DIAGNOSIS — G40109 Localization-related (focal) (partial) symptomatic epilepsy and epileptic syndromes with simple partial seizures, not intractable, without status epilepticus: Secondary | ICD-10-CM

## 2014-04-25 DIAGNOSIS — Z13220 Encounter for screening for lipoid disorders: Secondary | ICD-10-CM

## 2014-04-25 LAB — CBC
HCT: 35 % — ABNORMAL LOW (ref 39.0–52.0)
Hemoglobin: 11.8 g/dL — ABNORMAL LOW (ref 13.0–17.0)
MCH: 28.8 pg (ref 26.0–34.0)
MCHC: 33.7 g/dL (ref 30.0–36.0)
MCV: 85.4 fL (ref 78.0–100.0)
Platelets: 248 10*3/uL (ref 150–400)
RBC: 4.1 MIL/uL — ABNORMAL LOW (ref 4.22–5.81)
RDW: 13.2 % (ref 11.5–15.5)
WBC: 5 10*3/uL (ref 4.0–10.5)

## 2014-04-25 LAB — BASIC METABOLIC PANEL
Anion gap: 14 (ref 5–15)
BUN: 11 mg/dL (ref 6–23)
CO2: 19 mEq/L (ref 19–32)
Calcium: 8.3 mg/dL — ABNORMAL LOW (ref 8.4–10.5)
Chloride: 105 mEq/L (ref 96–112)
Creatinine, Ser: 0.71 mg/dL (ref 0.50–1.35)
GFR calc Af Amer: >90 mL/min (ref >90)
Glucose, Bld: 136 mg/dL — ABNORMAL HIGH (ref 70–99)
POTASSIUM: 3.5 meq/L — AB (ref 3.7–5.3)
SODIUM: 138 meq/L (ref 137–147)

## 2014-04-25 LAB — CARBAMAZEPINE LEVEL, TOTAL: CARBAMAZEPINE LVL: 9.3 ug/mL (ref 4.0–12.0)

## 2014-04-25 MED ORDER — HYDROMORPHONE HCL PF 1 MG/ML IJ SOLN: 0.5000 mg | INTRAMUSCULAR | Status: DC | PRN

## 2014-04-25 MED ORDER — SODIUM CHLORIDE 0.9 % IV SOLN
Freq: Once | INTRAVENOUS | Status: AC
  Administered 2014-04-25: 19:00:00 via INTRAVENOUS

## 2014-04-25 MED ORDER — ADULT MULTIVITAMIN W/MINERALS CH
1.0000 | ORAL_TABLET | Freq: Every day | ORAL | Status: DC
  Administered 2014-04-26: 1 via ORAL
  Filled 2014-04-25: qty 1

## 2014-04-25 MED ORDER — LEVOTHYROXINE SODIUM 50 MCG PO TABS
50.0000 ug | ORAL_TABLET | Freq: Every day | ORAL | Status: DC
  Administered 2014-04-26: 50 ug via ORAL
  Filled 2014-04-25: qty 1

## 2014-04-25 MED ORDER — SODIUM CHLORIDE 0.9 % IJ SOLN
3.0000 mL | Freq: Two times a day (BID) | INTRAMUSCULAR | Status: DC
Start: 2014-04-25 — End: 2014-04-26

## 2014-04-25 MED ORDER — OXYCODONE HCL 5 MG PO TABS: 5.0000 mg | ORAL_TABLET | ORAL | Status: DC | PRN

## 2014-04-25 MED ORDER — POTASSIUM CHLORIDE CRYS ER 20 MEQ PO TBCR
20.0000 meq | EXTENDED_RELEASE_TABLET | Freq: Four times a day (QID) | ORAL | Status: DC
  Administered 2014-04-25 – 2014-04-26 (×2): 20 meq via ORAL
  Filled 2014-04-25 (×2): qty 1

## 2014-04-25 MED ORDER — TOPIRAMATE ER 50 MG PO CAP24
100.0000 mg | ORAL_CAPSULE | Freq: Every day | ORAL | Status: DC
  Administered 2014-04-25: 100 mg via ORAL

## 2014-04-25 MED ORDER — PHENOBARBITAL SODIUM 65 MG/ML IJ SOLN
65.0000 mg | Freq: Two times a day (BID) | INTRAMUSCULAR | Status: DC | PRN
Start: 2014-04-25 — End: 2014-04-26

## 2014-04-25 MED ORDER — LACOSAMIDE 50 MG PO TABS
100.0000 mg | ORAL_TABLET | Freq: Every day | ORAL | Status: DC
  Administered 2014-04-25: 100 mg via ORAL
  Filled 2014-04-25: qty 2

## 2014-04-25 MED ORDER — LORAZEPAM 2 MG/ML IJ SOLN: 2.0000 mg | INTRAMUSCULAR | Status: DC | PRN

## 2014-04-25 MED ORDER — SODIUM CHLORIDE 0.9 % IV SOLN
INTRAVENOUS | Status: DC
  Administered 2014-04-25: 22:00:00 via INTRAVENOUS

## 2014-04-25 MED ORDER — LEVETIRACETAM 750 MG PO TABS
1500.0000 mg | ORAL_TABLET | Freq: Two times a day (BID) | ORAL | Status: DC
  Administered 2014-04-25 – 2014-04-26 (×2): 1500 mg via ORAL
  Filled 2014-04-25 (×2): qty 2

## 2014-04-25 MED ORDER — POTASSIUM PHOSPHATE MONOBASIC 500 MG PO TABS
500.0000 mg | ORAL_TABLET | Freq: Four times a day (QID) | ORAL | Status: DC
  Filled 2014-04-25: qty 1

## 2014-04-25 MED ORDER — CARBAMAZEPINE 100 MG PO CHEW
250.0000 mg | CHEWABLE_TABLET | Freq: Three times a day (TID) | ORAL | Status: DC
  Administered 2014-04-25 – 2014-04-26 (×2): 250 mg via ORAL
  Filled 2014-04-25 (×3): qty 2.5

## 2014-04-25 MED ORDER — LORATADINE 10 MG PO TABS
10.0000 mg | ORAL_TABLET | Freq: Every day | ORAL | Status: DC
  Administered 2014-04-26: 10 mg via ORAL
  Filled 2014-04-25: qty 1

## 2014-04-25 MED ORDER — ACETAMINOPHEN 325 MG PO TABS: 650.0000 mg | ORAL_TABLET | Freq: Four times a day (QID) | ORAL | Status: DC | PRN

## 2014-04-25 MED ORDER — K PHOS MONO-SOD PHOS DI & MONO 155-852-130 MG PO TABS
500.0000 mg | ORAL_TABLET | Freq: Four times a day (QID) | ORAL | Status: DC
  Administered 2014-04-25 – 2014-04-26 (×2): 500 mg via ORAL
  Filled 2014-04-25 (×5): qty 2

## 2014-04-25 MED ORDER — TOPIRAMATE ER 200 MG PO CAP24
200.0000 mg | ORAL_CAPSULE | Freq: Every day | ORAL | Status: DC
  Administered 2014-04-25: 200 mg via ORAL

## 2014-04-25 MED ORDER — LACOSAMIDE 50 MG PO TABS: 50.0000 mg | ORAL_TABLET | Freq: Three times a day (TID) | ORAL | Status: DC

## 2014-04-25 MED ORDER — LACOSAMIDE 50 MG PO TABS
50.0000 mg | ORAL_TABLET | ORAL | Status: DC
  Administered 2014-04-26: 50 mg via ORAL
  Filled 2014-04-25: qty 1

## 2014-04-25 MED ORDER — ONDANSETRON HCL 4 MG/2ML IJ SOLN: 4.0000 mg | Freq: Four times a day (QID) | INTRAMUSCULAR | Status: DC | PRN

## 2014-04-25 MED ORDER — ONDANSETRON HCL 4 MG PO TABS: 4.0000 mg | ORAL_TABLET | Freq: Four times a day (QID) | ORAL | Status: DC | PRN

## 2014-04-25 MED ORDER — ACETAMINOPHEN 650 MG RE SUPP: 650.0000 mg | Freq: Four times a day (QID) | RECTAL | Status: DC | PRN

## 2014-04-25 MED ORDER — ALBUTEROL SULFATE (2.5 MG/3ML) 0.083% IN NEBU: 2.5000 mg | INHALATION_SOLUTION | Freq: Two times a day (BID) | RESPIRATORY_TRACT | Status: DC | PRN

## 2014-04-25 MED ORDER — CLOBAZAM 10 MG PO TABS
5.0000 mg | ORAL_TABLET | Freq: Three times a day (TID) | ORAL | Status: DC
Start: 2014-04-25 — End: 2014-04-26
  Administered 2014-04-25 – 2014-04-26 (×2): 5 mg via ORAL
  Filled 2014-04-25 (×2): qty 1

## 2014-04-25 MED ORDER — LORAZEPAM 2 MG/ML IJ SOLN
INTRAMUSCULAR | Status: AC
  Filled 2014-04-25: qty 1

## 2014-04-25 NOTE — Consult Note (Addendum)
NEURO HOSPITALIST CONSULT NOTE    Reason for Consult: recurrent seizures with apneic episode HPI:                                                                                                                                          [name] is an 28 y.o. 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 Parents are at the bedside and they report 4 seizures at home, 2 of them described as " staring seizures" and also 2 generalized seizures with the last one causing an apneic episode. Patient had had prior admissions to this hospital with similar pattern of seizures and apnea. [name] is on vimpat, keppra, onfi, and carbamazepine with recent level that was reported as supra therapeutic. He was evaluated in this ED on 7/31 due to nausea, vomiting,ransient loss of vision, and HA. No sleep deprivation, changes in medication, fever, or infection. Currently, he is alert and awake, following commands appropriately, and family thinks that he is back to his baseline. Denies HA, vertigo, double vision, focal weakness or numbness, slurred speech, language or vision disturbances. Mild hypokalemia, unimpressive CBC. Carbamazepine level 9.3  Past Medical History  Diagnosis Date  . ALLERGIC RHINITIS 12/26/2009  . MITRAL VALVE PROLAPSE 06/22/2007  . SEIZURE DISORDER 06/22/2007  . Unspecified hypothyroidism 06/22/2007  . Growth disorder   . Mental retardation, mild (I.Q. 50-70)   . Brown's syndrome   . Heart murmur     moderate MR  . Sleep apnea     cpap , last sleep study 10/01/11  . Hypokalemia   . Atrial fibrillation     During port  removal and attempted placement in July 2013, transientt  . Hyperlipidemia     No therapy  . Blood clot in vein     Past Surgical History  Procedure Laterality Date  . Implantation vagal nerve stimulator      Left  . Portacath placement      and removal 04/08/12  . Eye surgery      X2 for Brown Syndrome    Family History  Problem Relation Age of Onset  . Breast cancer Mother   . Asthma Father   . Diabetes Father   . Coronary artery disease Maternal Grandfather   . Coronary artery disease Paternal Grandfather   . Lung cancer Paternal Grandfather     Died at 66  . Pneumonia Paternal Grandmother     Died at 9  . Stroke Paternal Grandmother   .  Congestive Heart Failure Maternal Grandfather     Died at 25  . Pneumonia Maternal Grandfather     Social History:  reports that he has never smoked. He has never used smokeless tobacco. He reports that he does not drink alcohol or use illicit drugs.  Allergies  Allergen Reactions  . Antihistamines, Chlorpheniramine-Type Other (See Comments)    Other Reaction: Dimetapp causes seizures  . Divalproex Sodium Other (See Comments)    Nausea, vomiting, liver and kidney dysfunction  . Phenytoin Rash and Other (See Comments)    Dilantin causes more seizures.   . Valproic Acid And Related Other (See Comments)    Shuts down systems  . Vancomycin Rash and Other (See Comments)    Other Reaction: Redman's Syndrome    MEDICATIONS:                                                                                                                     I have reviewed the patient's current medications.   ROS:                                                                                                                                       History obtained from chart review and parents  General ROS: negative for - chills, fatigue, fever, night sweats, weight gain or weight loss Psychological ROS: negative for - behavioral disorder,  hallucinations, memory difficulties, mood swings or suicidal ideation Ophthalmic ROS: negative for - blurry vision, double vision, eye pain or loss of vision ENT ROS: negative for - epistaxis, nasal discharge, oral lesions, sore throat, tinnitus or vertigo Allergy and Immunology ROS: negative for - hives or itchy/watery eyes Hematological and Lymphatic ROS: negative for - bleeding problems, bruising or swollen lymph nodes Endocrine ROS: negative for - galactorrhea, hair pattern changes, polydipsia/polyuria or temperature intolerance Respiratory ROS: negative for - cough, hemoptysis, shortness of breath or wheezing Cardiovascular ROS: negative for - chest pain, dyspnea on exertion, edema or irregular heartbeat Gastrointestinal ROS: negative for - abdominal pain, diarrhea, hematemesis, nausea/vomiting or stool incontinence Genito-Urinary ROS: negative for - dysuria, hematuria, incontinence or urinary frequency/urgency Musculoskeletal ROS: negative for - joint swelling or muscular weakness Neurological ROS: as noted in HPI Dermatological ROS: negative for rash and skin lesion changes  Physical exam: pleasant male in no apparent distress. Blood pressure 101/63, pulse 109, temperature 98.5 F (36.9 C), temperature source Oral, resp. rate  13, height $RemoveBe'5\' 3"'LbNEqGCGp$  (1.6 m), weight 51.71 kg (114 lb), SpO2 97.00%. Head: normocephalic. Neck: supple, no bruits, no JVD. Cardiac: no murmurs. Lungs: clear. Abdomen: soft, no tender, no mass. Extremities: no edema. Neurologic Examination:                                                                                                      Mental Status:  Alert, awake, oriented. Mild dysarthria. No aphasia. Able to follow 3 step commands without difficulty.  Cranial Nerves:  II: Discs flat bilaterally; Visual fields grossly normal, pupils equal, round, reactive to light and accommodation  [name],IV, VI: ptosis not present, strabismus with divergence in up gaze V,VII:  smile symmetric, facial light touch sensation normal bilaterally  VIII: hearing normal bilaterally  IX,X: gag reflex present  XI: bilateral shoulder shrug  XII: midline tongue extension without atrophy or fasciculations  Motor:  Moves all limbs symmetrically.  Sensory: Pinprick and light touch intact throughout, bilaterally  Deep Tendon Reflexes:  Couldn't elicit.  Plantars:  No tested  Cerebellar:  No tested.  Gait:  No tested.   No results found for this basename: cbc, bmp, coags, chol, tri, ldl, hga1c    Results for orders placed during the hospital encounter of 04/25/14 (from the past 48 hour(s))  CARBAMAZEPINE LEVEL, TOTAL     Status: None   Collection Time    04/25/14  5:55 PM      Result Value Ref Range   Carbamazepine Lvl 9.3  4.0 - 12.0 ug/mL  CBC     Status: Abnormal   Collection Time    04/25/14  5:55 PM      Result Value Ref Range   WBC 5.0  4.0 - 10.5 K/uL   RBC 4.10 (*) 4.22 - 5.81 MIL/uL   Hemoglobin 11.8 (*) 13.0 - 17.0 g/dL   HCT 35.0 (*) 39.0 - 52.0 %   MCV 85.4  78.0 - 100.0 fL   MCH 28.8  26.0 - 34.0 pg   MCHC 33.7  30.0 - 36.0 g/dL   RDW 13.2  11.5 - 15.5 %   Platelets 248  150 - 400 K/uL  BASIC METABOLIC PANEL     Status: Abnormal   Collection Time    04/25/14  5:55 PM      Result Value Ref Range   Sodium 138  137 - 147 mEq/L   Potassium 3.5 (*) 3.7 - 5.3 mEq/L   Chloride 105  96 - 112 mEq/L   CO2 19  19 - 32 mEq/L   Glucose, Bld 136 (*) 70 - 99 mg/dL   BUN 11  6 - 23 mg/dL   Creatinine, Ser 0.71  0.50 - 1.35 mg/dL   Calcium 8.3 (*) 8.4 - 10.5 mg/dL   GFR calc non Af Amer >90  >90 mL/min   GFR calc Af Amer >90  >90 mL/min   Comment: (NOTE)     The eGFR has been calculated using the CKD EPI equation.     This calculation has not been validated in all clinical situations.  eGFR's persistently <90 mL/min signify possible Chronic Kidney     Disease.   Anion gap 14  5 - 15    Dg Chest 2 View  04/25/2014   CLINICAL DATA:  Seizure,  persisting cough.  EXAM: CHEST  2 VIEW  COMPARISON:  Chest radiograph August 02, 2013  FINDINGS: Patient is rotated to the right. The cardiac silhouette appears upper limits of normal in size, similar. Mediastinal silhouette is nonsuspicious. Hazy density in left lung, slightly increased. Similar biapical pleural thickening. Vagal stimulator left chest. Single lumen to [name] right chest with distal tip at cavoatrial junction. No pneumothorax.  Patient is mildly osteopenic.  IMPRESSION: Hazy density in left lung, which could be projectional as patient is rotated to the right. No focal consolidation.  Borderline cardiomegaly.   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 Will continue her usual anticonvulsant regimen for now.  Ativan IV. Anticipate discharge in the morning.   Dorian Pod, MD 04/25/2014, 8:56 PM

## 2014-04-25 NOTE — ED Notes (Signed)
Pt with recurrent seizures hx of and has vns and mediport accessed/locked hit head denies pain

## 2014-04-25 NOTE — H&P (Signed)
[facility] Admission History and Physical       Ger Ringenberg Wee NKN:[id number] DOB: Jan 30, 1986 DOA: 04/25/2014  Referring physician:  EDP PCP: Chesley Noon, MD  Specialists:   Chief Complaint:  Seizures  HPI: [name] is a 28 y.o. male with a history of Seizure Disorder, Atrial Fibrillation, MVP, Hypothyroidism, OSA and Mental Retardation who presents to the ED after suffering 4 seizures at home in the afternoon.  He has 3 different types of Seizure per his mother's report and he had 2 of the minor seizures , but the last seizure he had at home was a grand mal, and lasted for  10 minutes.   Prior to the grand mal seizure he told his mother that he felt like he was going to have a seizure so she administered the Ativan they have prescribed PRN into his Port a Cath, however he still had the Seizure.  She reports that he became apneic and she began to perform CPR and EMS was called.   IN the ED he had another Seizure.  Neurology was consulted and Dr. Aram Beecham evaluated the patient in the ED.   He was referred for medical admission   Review of Systems:  Constitutional: No Weight Loss, No Weight Gain, Night Sweats, Fevers, Chills, Dizziness, Fatigue, or Generalized Weakness HEENT: No Headaches, Difficulty Swallowing,Tooth/Dental Problems,Sore Throat,  No Sneezing, Rhinitis, Ear Ache, Nasal Congestion, or Post Nasal Drip,  Cardio-vascular:  No Chest pain, Orthopnea, PND, Edema in Lower Extremities, Anasarca, Dizziness, Palpitations  Resp: No Dyspnea, No DOE, No Productive Cough, No Non-Productive Cough, No Hemoptysis, No Wheezing.    GI: No Heartburn, Indigestion, Abdominal Pain, Nausea, Vomiting, Diarrhea, Hematemesis, Hematochezia, Melena, Change in Bowel Habits,  Loss of Appetite  GU: No Dysuria, Change in Color of Urine, No Urgency or Frequency, No Flank pain.  Musculoskeletal: No Joint Pain or Swelling, No Decreased Range of Motion, No Back Pain.  Neurologic: No  Syncope, +Seizures, Muscle Weakness, Paresthesia, Vision Disturbance or Loss, No Diplopia, No Vertigo, No Difficulty Walking,  Skin: No Rash or Lesions. Psych: No Change in Mood or Affect, No Depression or Anxiety, No Memory loss, No Confusion, or Hallucinations   Past Medical History  Diagnosis Date  . ALLERGIC RHINITIS 12/26/2009  . MITRAL VALVE PROLAPSE 06/22/2007  . SEIZURE DISORDER 06/22/2007  . Unspecified hypothyroidism 06/22/2007  . Growth disorder   . Mental retardation, mild (I.Q. 50-70)   . Brown's syndrome   . Heart murmur     moderate MR  . Sleep apnea     cpap , last sleep study 10/01/11  . Hypokalemia   . Atrial fibrillation     During port removal and attempted placement in July 2013, transientt  . Hyperlipidemia     No therapy  . Blood clot in vein       Past Surgical History  Procedure Laterality Date  . Implantation vagal nerve stimulator      Left  . Portacath placement      and removal 04/08/12  . Eye surgery      X2 for Brown Syndrome       Prior to Admission medications   Medication Sig Start Date End Date Taking? Authorizing Provider  acetaminophen (TYLENOL) 500 MG tablet Take 500 mg by mouth 2 (two) times daily as needed for moderate pain (pain).    Yes Historical Provider, MD  albuterol (PROVENTIL) (2.5 MG/3ML) 0.083% nebulizer solution Take 2.5 mg by nebulization 2 (two) times daily as needed  for wheezing or shortness of breath.  12/09/13 12/09/14 Yes Historical Provider, MD  carbamazepine (TEGRETOL) 100 MG chewable tablet Chew 250 mg by mouth 3 (three) times daily.   Yes Historical Provider, MD  cetirizine (ZYRTEC) 10 MG tablet Take 10 mg by mouth daily.   Yes Historical Provider, MD  cloBAZam ([name]) 10 MG tablet Take 5 mg by mouth 3 (three) times daily.   Yes Historical Provider, MD  dextromethorphan (DELSYM) 30 MG/5ML liquid Take 60 mg by mouth 2 (two) times daily as needed for cough.   Yes Historical Provider, MD  diazepam (VALIUM) 2 MG tablet Take  2 mg by mouth every 6 (six) hours as needed (for seizures).   Yes Historical Provider, MD  lacosamide (VIMPAT) 50 MG TABS tablet Take 50-100 mg by mouth 3 (three) times daily. Take 1 tablet (50 mg) daily at 8:30 am and 1:30pm, take 2 tablets (100 mg) daily at bedtime: 9:30pm   Yes Historical Provider, MD  levETIRAcetam (KEPPRA) 500 MG tablet Take 1,500 mg by mouth 2 (two) times daily.   Yes Historical Provider, MD  levothyroxine (SYNTHROID, LEVOTHROID) 50 MCG tablet Take 50 mcg by mouth daily.    Yes Historical Provider, MD  LORazepam (ATIVAN) 2 MG/ML concentrated solution Withdraw 28ml (2mg ) and 50ml  Normal saline into 3 ml syringe. Administer IV push over 3-5 minutes as needed for recurrent seizures 10/13/13  Yes Rockwell Germany, NP  Multiple Vitamin (MULTIVITAMIN WITH MINERALS) TABS Take 1 tablet by mouth daily.   Yes Historical Provider, MD  ondansetron (ZOFRAN ODT) 8 MG disintegrating tablet Take 1 tablet (8 mg total) by mouth every 8 (eight) hours as needed for nausea or vomiting. 04/21/14  Yes Richarda Blade, MD  PHENObarbital (LUMINAL) 65 MG/ML injection Withdraw 50ml (65mg ) Phenobarbital and 17ml Normal Saline into 78ml syringe. Adminster IV push for 3-5 minutes as needed for recurrent seizures 10/13/13  Yes Rockwell Germany, NP  potassium chloride SA (K-DUR,KLOR-CON) 20 MEQ tablet Take 20 mEq by mouth 4 (four) times daily.   Yes Historical Provider, MD  potassium phosphate, monobasic, (K-PHOS ORIGINAL) 500 MG tablet Take 1 tablet (500 mg total) by mouth 4 (four) times daily. 10/26/13  Yes Jodi Geralds, MD  Topiramate ER (TROKENDI XR) 200 MG CP24 Take 200 mg by mouth at bedtime.   Yes Historical Provider, MD  Topiramate ER (TROKENDI XR) 50 MG CP24 Take 100 mg by mouth at bedtime.   Yes Historical Provider, MD     Allergies  Allergen Reactions  . Antihistamines, Chlorpheniramine-Type Other (See Comments)    Other Reaction: Dimetapp causes seizures  . Divalproex Sodium Other (See Comments)      Nausea, vomiting, liver and kidney dysfunction  . Phenytoin Rash and Other (See Comments)    Dilantin causes more seizures.   . Valproic Acid And Related Other (See Comments)    Shuts down systems  . Vancomycin Rash and Other (See Comments)    Other Reaction: Redman's Syndrome     Social History:  reports that he has never smoked. He has never used smokeless tobacco. He reports that he does not drink alcohol or use illicit drugs.     Family History  Problem Relation Age of Onset  . Breast cancer Mother   . Asthma Father   . Diabetes Father   . Coronary artery disease Maternal Grandfather   . Coronary artery disease Paternal Grandfather   . Lung cancer Paternal Grandfather     Died at 74  .  Pneumonia Paternal Grandmother     Died at 61  . Stroke Paternal Grandmother   . Congestive Heart Failure Maternal Grandfather     Died at 69  . Pneumonia Maternal Grandfather       Physical Exam:  GEN:  Pleasant Well Nourished and Well developed  28 y.o.  Caucasian male examined  and in no acute distress; cooperative with exam Filed Vitals:   04/25/14 1800 04/25/14 1815 04/25/14 1845 04/25/14 2122  BP: 104/55 104/57 101/63 104/58  Pulse: 103 112 109 90  Temp:      TempSrc:      Resp: 22 13  21   Height:      Weight:      SpO2: 100% 100% 97% 97%   Blood pressure 104/58, pulse 90, temperature 98.5 F (36.9 C), temperature source Oral, resp. rate 21, height 5\' 3"  (1.6 m), weight 51.71 kg (114 lb), SpO2 97.00%. PSYCH: He is alert and oriented x 3; does not appear anxious does not appear depressed; affect is normal HEENT: Normocephalic and Atraumatic, Mucous membranes pink; PERRLA; EOM intact; Fundi:  Benign;  No scleral icterus, Nares: Patent, Oropharynx: Clear, Fair Dentition,   Neck:  FROM, No Cervical Lymphadenopathy nor Thyromegaly or Carotid Bruit; No JVD; Breasts:: Not examined CHEST WALL: No tenderness,  Left Anterior Chest Port A Cath CHEST: Normal respiration, clear to  auscultation bilaterally HEART: Regular rate and rhythm; no murmurs rubs or gallops BACK: No kyphosis or scoliosis; No CVA tenderness ABDOMEN: Positive Bowel Sounds, Soft Non-Tender; No Masses, No Organomegaly.   Rectal Exam: Not done EXTREMITIES: No Cyanosis, Clubbing, or Edema; No Ulcerations. Genitalia: not examined PULSES: 2+ and symmetric SKIN: Normal hydration no rash or ulceration CNS: Alert and Oriented x 3, No Focal Deficits  Vascular: pulses palpable throughout    Labs on Admission:  Basic Metabolic Panel:  Recent Labs Lab 04/21/14 1937 04/25/14 1755  NA 137 138  K 3.5* 3.5*  CL 104 105  CO2 19 19  GLUCOSE 104* 136*  BUN 12 11  CREATININE 0.79 0.71  CALCIUM 8.9 8.3*   Liver Function Tests:  Recent Labs Lab 04/21/14 1937  AST 18  ALT 16  ALKPHOS 89  BILITOT 0.4  PROT 7.5  ALBUMIN 3.3*   No results found for this basename: LIPASE, AMYLASE,  in the last 168 hours No results found for this basename: AMMONIA,  in the last 168 hours CBC:  Recent Labs Lab 04/21/14 1937 04/25/14 1755  WBC 6.9 5.0  NEUTROABS 4.0  --   HGB 12.9* 11.8*  HCT 38.8* 35.0*  MCV 87.6 85.4  PLT 199 248   Cardiac Enzymes: No results found for this basename: CKTOTAL, CKMB, CKMBINDEX, TROPONINI,  in the last 168 hours  BNP (last 3 results) No results found for this basename: PROBNP,  in the last 8760 hours CBG: No results found for this basename: GLUCAP,  in the last 168 hours  Radiological Exams on Admission: Dg Chest 2 View  04/25/2014   CLINICAL DATA:  Seizure, persisting cough.  EXAM: CHEST  2 VIEW  COMPARISON:  Chest radiograph August 02, 2013  FINDINGS: Patient is rotated to the right. The cardiac silhouette appears upper limits of normal in size, similar. Mediastinal silhouette is nonsuspicious. Hazy density in left lung, slightly increased. Similar biapical pleural thickening. Vagal stimulator left chest. Single lumen to [name] right chest with distal tip at  cavoatrial junction. No pneumothorax.  Patient is mildly osteopenic.  IMPRESSION: Hazy density in left lung,  which could be projectional as patient is rotated to the right. No focal consolidation.  Borderline cardiomegaly.   Electronically Signed   By: Elon Alas   On: 04/25/2014 18:17     EKG: Independently reviewed.    Assessment/Plan:   28 y.o. male with  Principal Problem:   1.   Seizure   Neuro saw in ED, recommend No changes in Meds   Continue Carbamezapine, Vimpat, Keppra, Topamax   PRN IV Ativan, and PRN IV Phenobarbital    Active Problems:   2.    A-fib   stable       3.    Mitral Vale Prolapse   stable     4.    Unspecified hypothyroidism   Continue Levothyroxine     5.    Obstructive sleep apnea (adult) (pediatric)   CPAP qhs      6.  DVT Prophylaxis   SCDs        Code Status:   FULL CODE Family Communication:    Parents at Bedside Disposition Plan:         Time spent:  [place] [facility] Pager [phone]   If Strong Please Contact the Day Rounding Team MD for [facility]  If 7PM-7AM, Please Contact night-coverage  [email or web address] Password [facility] 04/25/2014, 9:57 PM

## 2014-04-25 NOTE — Progress Notes (Signed)
Pt transferred to unit from ED via NT x 1. Pt connected to telemetry and central monitoring notified. No signs or symptoms of acute distress. No complaints of pain or discomfort. Pt resting in bed at lowest position, with call bell in reach. Father at bedside. Will continue to monitor. Fortino Sic, RN, BSN 04/25/2014 9:53 PM

## 2014-04-25 NOTE — ED Provider Notes (Signed)
2055 - Patient care assumed from Dr. Evelina Bucy at shift change with neurology consult pending. Patient with recurrent seizures PTA with brief period of apnea requiring compressions from parents. Patient seen by Dr. Armida Sans who recommends inpatient observation to ensure no further seizure activity. No plans for medication changes at this time, per Dr. Armida Sans. Will continue with seizure medications as prescribed. Case discussed with Dr. Arnoldo Morale of [facility]; patient admitted to Obs tele. Temp admit orders placed.   Filed Vitals:   04/25/14 1730 04/25/14 1800 04/25/14 1815 04/25/14 1845  BP: 100/62 104/55 104/57 101/63  Pulse: 105 103 112 109  Temp:      TempSrc:      Resp: 24 22 13    Height:      Weight:      SpO2: 98% 100% 100% 97%     Antonietta Breach, PA-C 04/25/14 2100

## 2014-04-25 NOTE — ED Provider Notes (Signed)
CSN: [id number]     Arrival date & time 04/25/14  1716 History   First MD Initiated Contact with Patient 04/25/14 1717     Chief Complaint  Patient presents with  . Seizures     (Consider location/radiation/quality/duration/timing/severity/associated sxs/prior Treatment) Patient is a 28 y.o. male presenting with seizures. The history is provided by the patient.  Seizures Seizure activity on arrival: no   Seizure type:  Grand mal Preceding symptoms: no headache and no hyperventilation   Initial focality:  None Episode characteristics: apnea and unresponsiveness   Episode characteristics: no abnormal movements   Return to baseline: yes   Severity:  Severe Duration:  10 minutes Timing:  Clustered Number of seizures this episode:  2 Progression:  Unchanged Context: not cerebral palsy, not drug use, not fever and not stress   PTA treatment:  Lorazepam (2 mg Atvain) History of seizures: yes   Similar to previous episodes: yes   Severity:  Moderate Seizure control level:  Well controlled Current therapy:  Carbamazepine and levetiracetam ([name], Vimpat) Compliance with current therapy:  Good   Past Medical History  Diagnosis Date  . ALLERGIC RHINITIS 12/26/2009  . MITRAL VALVE PROLAPSE 06/22/2007  . SEIZURE DISORDER 06/22/2007  . Unspecified hypothyroidism 06/22/2007  . Growth disorder   . Mental retardation, mild (I.Q. 50-70)   . Brown's syndrome   . Heart murmur     moderate MR  . Sleep apnea     cpap , last sleep study 10/01/11  . Hypokalemia   . Atrial fibrillation     During port removal and attempted placement in July 2013, transientt  . Hyperlipidemia     No therapy  . Blood clot in vein    Past Surgical History  Procedure Laterality Date  . Implantation vagal nerve stimulator      Left  . Portacath placement      and removal 04/08/12  . Eye surgery      X2 for Brown Syndrome   Family History  Problem Relation Age of Onset  . Breast cancer Mother   . Asthma  Father   . Diabetes Father   . Coronary artery disease Maternal Grandfather   . Coronary artery disease Paternal Grandfather   . Lung cancer Paternal Grandfather     Died at 66  . Pneumonia Paternal Grandmother     Died at 48  . Stroke Paternal Grandmother   . Congestive Heart Failure Maternal Grandfather     Died at 24  . Pneumonia Maternal Grandfather    History  Substance Use Topics  . Smoking status: Never Smoker   . Smokeless tobacco: Never Used  . Alcohol Use: No    Review of Systems  Constitutional: Negative for fever and chills.  Respiratory: Negative for cough and shortness of breath.   Cardiovascular: Negative for chest pain and leg swelling.  Gastrointestinal: Negative for vomiting and abdominal pain.  Neurological: Positive for seizures.  All other systems reviewed and are negative.     Allergies  Antihistamines, chlorpheniramine-type; Divalproex sodium; Phenytoin; Valproic acid and related; and Vancomycin  Home Medications   Prior to Admission medications   Medication Sig Start Date End Date Taking? Authorizing Provider  acetaminophen (TYLENOL) 500 MG tablet Take 500 mg by mouth every 6 (six) hours as needed for moderate pain.    Historical Provider, MD  cetirizine (ZYRTEC) 10 MG tablet Take 10 mg by mouth daily.    Historical Provider, MD  cloBAZam ([name]) 10 MG tablet Take  5 mg by mouth 3 (three) times daily.    Historical Provider, MD  diazepam (VALIUM) 2 MG tablet Take 2 mg by mouth every 6 (six) hours as needed (for seizures).    Historical Provider, MD  lacosamide (VIMPAT) 50 MG TABS tablet Take 50-100 mg by mouth 3 (three) times daily. Takes 1 tab in am, 1 tab in pm and 2 tabs at bedtime    Historical Provider, MD  levETIRAcetam (KEPPRA) 500 MG tablet Take 1,500 mg by mouth 2 (two) times daily.    Historical Provider, MD  levothyroxine (SYNTHROID, LEVOTHROID) 50 MCG tablet Take 50 mcg by mouth every morning.     Historical Provider, MD  LORazepam  (ATIVAN) 2 MG/ML concentrated solution Withdraw 33ml (2mg ) and 102ml  Normal saline into 3 ml syringe. Administer IV push over 3-5 minutes as needed for recurrent seizures 10/13/13   Rockwell Germany, NP  Multiple Vitamin (MULTIVITAMIN WITH MINERALS) TABS Take 1 tablet by mouth daily.    Historical Provider, MD  ondansetron (ZOFRAN ODT) 8 MG disintegrating tablet Take 1 tablet (8 mg total) by mouth every 8 (eight) hours as needed for nausea or vomiting. 04/21/14   Richarda Blade, MD  oxycodone (OXY-IR) 5 MG capsule Take 5 mg by mouth every 4 (four) hours as needed for pain.     Historical Provider, MD  PHENObarbital (LUMINAL) 65 MG/ML injection Withdraw 23ml (65mg ) Phenobarbital and 6ml Normal Saline into 35ml syringe. Adminster IV push for 3-5 minutes as needed for recurrent seizures 10/13/13   Rockwell Germany, NP  potassium chloride SA (K-DUR,KLOR-CON) 20 MEQ tablet Take 20 mEq by mouth 4 (four) times daily.    Historical Provider, MD  potassium phosphate, monobasic, (K-PHOS ORIGINAL) 500 MG tablet Take 1 tablet (500 mg total) by mouth 4 (four) times daily. 10/26/13   Jodi Geralds, MD  Topiramate ER (TROKENDI XR) 200 MG CP24 Take 200 mg by mouth at bedtime.    Historical Provider, MD  Topiramate ER (TROKENDI XR) 50 MG CP24 Take 100 mg by mouth at bedtime.    Historical Provider, MD   BP 100/62  Pulse 105  Temp(Src) 98.5 F (36.9 C) (Oral)  Resp 24  Ht 5\' 3"  (1.6 m)  Wt 114 lb (51.71 kg)  BMI 20.20 kg/m2  SpO2 98% Physical Exam  Nursing note and vitals reviewed. Constitutional: He appears well-developed and well-nourished. No distress. He is not intubated.  HENT:  Head: Normocephalic.    Mouth/Throat: Oropharynx is clear and moist. No oropharyngeal exudate.  Eyes: EOM are normal. Pupils are equal, round, and reactive to light.  Neck: Normal range of motion. Neck supple.  Cardiovascular: Normal rate and regular rhythm.  Exam reveals no friction rub.   No murmur heard. Pulmonary/Chest:  Effort normal and breath sounds normal. No apnea. He is not intubated. No respiratory distress. He has no decreased breath sounds. He has no wheezes. He has no rales.  Abdominal: Soft. He exhibits no distension. There is no tenderness. There is no rebound.  Musculoskeletal: Normal range of motion. He exhibits no edema.  Neurological: He is alert. No cranial nerve deficit. He exhibits normal muscle tone.  Skin: No rash noted. He is not diaphoretic.    ED Course  Procedures (including critical care time) Labs Review Labs Reviewed - No data to display  Imaging Review No results found.   EKG Interpretation None      MDM   Final diagnoses:  Seizure    76M presents with seizures.  Hx of same. Had one seizure today, give 2 mg of Ativan 4 hrs ago. Was taking a shower had a 10 minute seizure, similar to prior grand mal, with apnea. Mom did 3 chest compressions and he woke back up. Patient is acting normally now. Denies any pain. Has hematoma on L forehead and abrasion on L posterior chest from a fall during the seizure.  Will check labs, CXR. Will speak with Dr. Gaynell Face, his Neurologist. One seizure here, petit mal, resolved after one minute, didn't need ativan. Dr. Armida Sans consulted to see to help with disposition.  Evelina Bucy, MD 04/27/14 [phone]

## 2014-04-25 NOTE — ED Notes (Signed)
Called  To room by parents of pt pt actively seizing per dr. Mingo Amber give ativan 1 mg iv now dr. Mingo Amber at bedside writer went to get med and returned to find pt alert at that time and following commands per dr. Mingo Amber hold ativan at this time

## 2014-04-25 NOTE — ED Notes (Signed)
Iv therapist called to draw blood from [name]

## 2014-04-26 ENCOUNTER — Telehealth: Payer: Self-pay | Admitting: Family

## 2014-04-26 DIAGNOSIS — G40109 Localization-related (focal) (partial) symptomatic epilepsy and epileptic syndromes with simple partial seizures, not intractable, without status epilepticus: Secondary | ICD-10-CM

## 2014-04-26 LAB — BASIC METABOLIC PANEL
Anion gap: 13 (ref 5–15)
BUN: 9 mg/dL (ref 6–23)
CHLORIDE: 110 meq/L (ref 96–112)
CO2: 20 meq/L (ref 19–32)
Calcium: 7.7 mg/dL — ABNORMAL LOW (ref 8.4–10.5)
Creatinine, Ser: 0.72 mg/dL (ref 0.50–1.35)
GFR calc non Af Amer: >90 mL/min (ref >90)
Glucose, Bld: 98 mg/dL (ref 70–99)
Potassium: 3.5 mEq/L — ABNORMAL LOW (ref 3.7–5.3)
Sodium: 143 mEq/L (ref 137–147)

## 2014-04-26 LAB — CBC
HCT: 32.4 % — ABNORMAL LOW (ref 39.0–52.0)
Hemoglobin: 10.7 g/dL — ABNORMAL LOW (ref 13.0–17.0)
MCH: 29 pg (ref 26.0–34.0)
MCHC: 33 g/dL (ref 30.0–36.0)
MCV: 87.8 fL (ref 78.0–100.0)
Platelets: 212 10*3/uL (ref 150–400)
RBC: 3.69 MIL/uL — AB (ref 4.22–5.81)
RDW: 13.6 % (ref 11.5–15.5)
WBC: 3.4 10*3/uL — ABNORMAL LOW (ref 4.0–10.5)

## 2014-04-26 MED ORDER — HEPARIN SOD (PORK) LOCK FLUSH 100 UNIT/ML IV SOLN
500.0000 [IU] | INTRAVENOUS | Status: AC | PRN
  Administered 2014-04-26: 500 [IU]

## 2014-04-26 NOTE — Telephone Encounter (Signed)
Shanon Brow, PA on Neuroscience left a message asking me to call him about [name], who was admitted to hospital last night after series of 4 seizures. I called him and he said that [name] had a seizure that required administration of Ativan. He recovered, went to bathroom, Mom then found him down in seizure and unresponsive. She called for help and had to give him chest compressions. He responded was taken to ER and admitted. He was observed overnight, no changes to medications. He is being discharged today. [name] has follow up visit with Dr Gaynell Face in October. Shanon Brow asks if Dr Gaynell Face wants him seen sooner than that? I told him that I would relay information to Dr Gaynell Face and call Mom later today after [name] is discharged. TG

## 2014-04-26 NOTE — Discharge Summary (Signed)
Physician Discharge Summary  [name] JJO:[id number] DOB: [birth year] DOA: 04/25/2014  PCP: Chesley Noon, MD  Admit date: 04/25/2014 Discharge date: 04/26/2014  Time spent: 35 minutes  Recommendations for Outpatient Follow-up:  1. Follow up with Dr. Gaynell Face in 2-4 weeks. No change to his medications.  Discharge Diagnoses:  Principal Problem:   Seizure Active Problems:   Unspecified hypothyroidism   MITRAL VALVE PROLAPSE   A-fib   Obstructive sleep apnea (adult) (pediatric)   Chronic anticoagulation   Seizures   Discharge Condition: stable  Diet recommendation: regular  Filed Weights   04/25/14 1727 04/25/14 2157  Weight: 51.71 kg (114 lb) 51.71 kg (114 lb)    History of present illness:  28 y.o. male with a history of Seizure Disorder, Atrial Fibrillation, MVP, Hypothyroidism, OSA and Mental Retardation who presents to the ED after suffering 4 seizures at home in the afternoon. He has 3 different types of Seizure per his mother's report and he had 2 of the minor seizures , but the last seizure he had at home was a grand mal, and lasted for 10 minutes   Hospital Course:  Seizure: - given ativan in ED. - Neurology consulted and recommended to continue current therapy and ativan PRN.  - Admit under observation with no events overnight.   Obstructive sleep apnea (adult) (pediatric)  - Cont C-pap.  Unspecified hypothyroidism  - Cont synthroid.   Procedures:  CXR  Consultations:  neurology  Discharge Exam: Filed Vitals:   04/26/14 0610  BP: 82/37  Pulse: 73  Temp: 97.4 F (36.3 C)  Resp: 18    General: A&O x1 Cardiovascular: RRR Respiratory: good air movement CTA B/l  Discharge Instructions You were cared for by a hospitalist during your hospital stay. If you have any questions about your discharge medications or the care you received while you were in the hospital after you are discharged, you can call the unit and asked to speak with the  hospitalist on call if the hospitalist that took care of you is not available. Once you are discharged, your primary care physician will handle any further medical issues. Please note that NO REFILLS for any discharge medications will be authorized once you are discharged, as it is imperative that you return to your primary care physician (or establish a relationship with a primary care physician if you do not have one) for your aftercare needs so that they can reassess your need for medications and monitor your lab values.      Discharge Instructions   Diet - low sodium heart healthy    Complete by:  As directed      Increase activity slowly    Complete by:  As directed             Medication List         acetaminophen 500 MG tablet  Commonly known as:  TYLENOL  Take 500 mg by mouth 2 (two) times daily as needed for moderate pain (pain).     albuterol (2.5 MG/3ML) 0.083% nebulizer solution  Commonly known as:  PROVENTIL  Take 2.5 mg by nebulization 2 (two) times daily as needed for wheezing or shortness of breath.     carbamazepine 100 MG chewable tablet  Commonly known as:  TEGRETOL  Chew 250 mg by mouth 3 (three) times daily.     cetirizine 10 MG tablet  Commonly known as:  ZYRTEC  Take 10 mg by mouth daily.     dextromethorphan 30 MG/5ML  liquid  Commonly known as:  DELSYM  Take 60 mg by mouth 2 (two) times daily as needed for cough.     diazepam 2 MG tablet  Commonly known as:  VALIUM  Take 2 mg by mouth every 6 (six) hours as needed (for seizures).     lacosamide 50 MG Tabs tablet  Commonly known as:  VIMPAT  Take 50-100 mg by mouth 3 (three) times daily. Take 1 tablet (50 mg) daily at 8:30 am and 1:30pm, take 2 tablets (100 mg) daily at bedtime: 9:30pm     levETIRAcetam 500 MG tablet  Commonly known as:  KEPPRA  Take 1,500 mg by mouth 2 (two) times daily.     levothyroxine 50 MCG tablet  Commonly known as:  SYNTHROID, LEVOTHROID  Take 50 mcg by mouth daily.      LORazepam 2 MG/ML concentrated solution  Commonly known as:  ATIVAN  Withdraw 84ml (2mg ) and 57ml  Normal saline into 3 ml syringe. Administer IV push over 3-5 minutes as needed for recurrent seizures     multivitamin with minerals Tabs tablet  Take 1 tablet by mouth daily.     ondansetron 8 MG disintegrating tablet  Commonly known as:  ZOFRAN ODT  Take 1 tablet (8 mg total) by mouth every 8 (eight) hours as needed for nausea or vomiting.     [name] 10 MG tablet  Generic drug:  cloBAZam  Take 5 mg by mouth 3 (three) times daily.     PHENObarbital 65 MG/ML injection  Commonly known as:  LUMINAL  Withdraw 77ml (65mg ) Phenobarbital and 63ml Normal Saline into 16ml syringe. Adminster IV push for 3-5 minutes as needed for recurrent seizures     potassium chloride SA 20 MEQ tablet  Commonly known as:  K-DUR,KLOR-CON  Take 20 mEq by mouth 4 (four) times daily.     potassium phosphate (monobasic) 500 MG tablet  Commonly known as:  K-PHOS ORIGINAL  Take 1 tablet (500 mg total) by mouth 4 (four) times daily.     TROKENDI XR 200 MG Cp24  Generic drug:  Topiramate ER  Take 200 mg by mouth at bedtime.     TROKENDI XR 50 MG Cp24  Generic drug:  Topiramate ER  Take 100 mg by mouth at bedtime.       Allergies  Allergen Reactions  . Antihistamines, Chlorpheniramine-Type Other (See Comments)    Other Reaction: Dimetapp causes seizures  . Divalproex Sodium Other (See Comments)    Nausea, vomiting, liver and kidney dysfunction  . Phenytoin Rash and Other (See Comments)    Dilantin causes more seizures.   . Valproic Acid And Related Other (See Comments)    Shuts down systems  . Vancomycin Rash and Other (See Comments)    Other Reaction: Redman's Syndrome      The results of significant diagnostics from this hospitalization (including imaging, microbiology, ancillary and laboratory) are listed below for reference.    Significant Diagnostic Studies: Dg Chest 2 View  04/25/2014    CLINICAL DATA:  Seizure, persisting cough.  EXAM: CHEST  2 VIEW  COMPARISON:  Chest radiograph August 02, 2013  FINDINGS: Patient is rotated to the right. The cardiac silhouette appears upper limits of normal in size, similar. Mediastinal silhouette is nonsuspicious. Hazy density in left lung, slightly increased. Similar biapical pleural thickening. Vagal stimulator left chest. Single lumen to [name] right chest with distal tip at cavoatrial junction. No pneumothorax.  Patient is mildly osteopenic.  IMPRESSION: Hazy density  in left lung, which could be projectional as patient is rotated to the right. No focal consolidation.  Borderline cardiomegaly.   Electronically Signed   By: Elon Alas   On: 04/25/2014 18:17    Microbiology: No results found for this or any previous visit (from the past 240 hour(s)).   Labs: Basic Metabolic Panel:  Recent Labs Lab 04/21/14 1937 04/25/14 1755 04/26/14 0520  NA 137 138 143  K 3.5* 3.5* 3.5*  CL 104 105 110  CO2 19 19 20   GLUCOSE 104* 136* 98  BUN 12 11 9   CREATININE 0.79 0.71 0.72  CALCIUM 8.9 8.3* 7.7*   Liver Function Tests:  Recent Labs Lab 04/21/14 1937  AST 18  ALT 16  ALKPHOS 89  BILITOT 0.4  PROT 7.5  ALBUMIN 3.3*   No results found for this basename: LIPASE, AMYLASE,  in the last 168 hours No results found for this basename: AMMONIA,  in the last 168 hours CBC:  Recent Labs Lab 04/21/14 1937 04/25/14 1755 04/26/14 0520  WBC 6.9 5.0 3.4*  NEUTROABS 4.0  --   --   HGB 12.9* 11.8* 10.7*  HCT 38.8* 35.0* 32.4*  MCV 87.6 85.4 87.8  PLT 199 248 212   Cardiac Enzymes: No results found for this basename: CKTOTAL, CKMB, CKMBINDEX, TROPONINI,  in the last 168 hours BNP: BNP (last 3 results) No results found for this basename: PROBNP,  in the last 8760 hours CBG: No results found for this basename: GLUCAP,  in the last 168 hours     Signed:  Charlynne Cousins  [facility] 04/26/2014, 8:05  AM

## 2014-04-26 NOTE — Progress Notes (Signed)
Patient is discharged from room 4N06 at this time. Alert and in stable condition. No seizure activity noted. IV team flushed and hep lock implanted port to right chest. Tele d/c'd. Instructions read to patient and mother and understanding verbalized. Left unit via wheelchair with family and belonging at side.

## 2014-04-26 NOTE — Progress Notes (Signed)
[facility] PROGRESS NOTE  Assessment/Plan: Seizure - Neurology consulted and recommended to continue current therapy and ativan PRN. - no events overnight.  Obstructive sleep apnea (adult) (pediatric) - C-pap  Unspecified hypothyroidism -  Cont synthroid.   Code Status: full Family Communication: mother  Disposition Plan: inpatient   Consultants:  neurology  Procedures:  CXR  Antibiotics:  none  HPI/Subjective: No complain, no seizures overnight wants to go home  Objective: Filed Vitals:   04/25/14 1845 04/25/14 2122 04/25/14 2157 04/26/14 0610  BP: 101/63 104/58 97/54 82/37   Pulse: 109 90 84 73  Temp:   98.2 F (36.8 C) 97.4 F (36.3 C)  TempSrc:   Oral Oral  Resp:  21 18 18   Height:   5\' 3"  (1.6 m)   Weight:   51.71 kg (114 lb)   SpO2: 97% 97% 100% 99%    Intake/Output Summary (Last 24 hours) at 04/26/14 0753 Last data filed at 04/26/14 0745  Gross per 24 hour  Intake      0 ml  Output    200 ml  Net   -200 ml   Filed Weights   04/25/14 1727 04/25/14 2157  Weight: 51.71 kg (114 lb) 51.71 kg (114 lb)    Exam:  General:  in no acute distress.  HEENT: No bruits, no goiter.  Heart: Regular rate and rhythm Lungs: Good air movement and clear Abdomen: Soft, nontender, nondistended, positive bowel sounds.    Data Reviewed: Basic Metabolic Panel:  Recent Labs Lab 04/21/14 1937 04/25/14 1755 04/26/14 0520  NA 137 138 143  K 3.5* 3.5* 3.5*  CL 104 105 110  CO2 19 19 20   GLUCOSE 104* 136* 98  BUN 12 11 9   CREATININE 0.79 0.71 0.72  CALCIUM 8.9 8.3* 7.7*   Liver Function Tests:  Recent Labs Lab 04/21/14 1937  AST 18  ALT 16  ALKPHOS 89  BILITOT 0.4  PROT 7.5  ALBUMIN 3.3*   No results found for this basename: LIPASE, AMYLASE,  in the last 168 hours No results found for this basename: AMMONIA,  in the last 168 hours CBC:  Recent Labs Lab 04/21/14 1937 04/25/14 1755 04/26/14 0520  WBC 6.9 5.0 3.4*  NEUTROABS  4.0  --   --   HGB 12.9* 11.8* 10.7*  HCT 38.8* 35.0* 32.4*  MCV 87.6 85.4 87.8  PLT 199 248 212   Cardiac Enzymes: No results found for this basename: CKTOTAL, CKMB, CKMBINDEX, TROPONINI,  in the last 168 hours BNP (last 3 results) No results found for this basename: PROBNP,  in the last 8760 hours CBG: No results found for this basename: GLUCAP,  in the last 168 hours  No results found for this or any previous visit (from the past 240 hour(s)).   Studies: Dg Chest 2 View  04/25/2014   CLINICAL DATA:  Seizure, persisting cough.  EXAM: CHEST  2 VIEW  COMPARISON:  Chest radiograph August 02, 2013  FINDINGS: Patient is rotated to the right. The cardiac silhouette appears upper limits of normal in size, similar. Mediastinal silhouette is nonsuspicious. Hazy density in left lung, slightly increased. Similar biapical pleural thickening. Vagal stimulator left chest. Single lumen to [name] right chest with distal tip at cavoatrial junction. No pneumothorax.  Patient is mildly osteopenic.  IMPRESSION: Hazy density in left lung, which could be projectional as patient is rotated to the right. No focal consolidation.  Borderline cardiomegaly.   Electronically Signed   By: Elon Alas  On: 04/25/2014 18:17    Scheduled Meds: . carbamazepine  250 mg Oral TID  . cloBAZam  5 mg Oral TID  . lacosamide  100 mg Oral QHS  . lacosamide  50 mg Oral 2 times per day  . levETIRAcetam  1,500 mg Oral BID  . levothyroxine  50 mcg Oral QAC breakfast  . loratadine  10 mg Oral Daily  . multivitamin with minerals  1 tablet Oral Daily  . phosphorus  500 mg Oral QID  . potassium chloride SA  20 mEq Oral QID  . sodium chloride  3 mL Intravenous Q12H  . Topiramate ER  100 mg Oral QHS  . Topiramate ER  200 mg Oral QHS   Continuous Infusions: . sodium chloride 75 mL/hr at 04/25/14 2217     Charlynne Cousins  [facility] Pager [phone]. If 8PM-8AM, please contact night-coverage at  [email or web address], password [facility] 04/26/2014, 7:53 AM  LOS: 1 day      **Disclaimer: This note may have been dictated with voice recognition software. Similar sounding words can inadvertently be transcribed and this note may contain transcription errors which may not have been corrected upon publication of note.**

## 2014-04-26 NOTE — Telephone Encounter (Signed)
You have new patient openings on 05/16/14, which is also the first day of school for [employer]. [name] would be able to come then since he is not in school. Shall I schedule him then? Otila Kluver

## 2014-04-26 NOTE — Telephone Encounter (Signed)
Please let me know in the next opening is.  We certainly can see him before October.

## 2014-04-26 NOTE — Telephone Encounter (Signed)
I called Mom and scheduled [name] to be seen on May 16, 2014 @ 2 PM with Dr Gaynell Face. TG

## 2014-04-26 NOTE — ED Provider Notes (Signed)
Medical screening examination/treatment/procedure(s) were performed by non-physician practitioner and as supervising physician I was immediately available for consultation/collaboration.   EKG Interpretation None       Varney Biles, MD 04/26/14 [phone]

## 2014-04-26 NOTE — Telephone Encounter (Signed)
Yes schedule him then.  If we were start we would only schedule infants, toddlers, and adults on that day.

## 2014-05-12 ENCOUNTER — Emergency Department (HOSPITAL_COMMUNITY): Payer: Medicaid Other

## 2014-05-12 ENCOUNTER — Telehealth: Payer: Self-pay | Admitting: *Deleted

## 2014-05-12 ENCOUNTER — Encounter (HOSPITAL_COMMUNITY): Payer: Self-pay | Admitting: Emergency Medicine

## 2014-05-12 ENCOUNTER — Inpatient Hospital Stay (HOSPITAL_COMMUNITY)
Admission: EM | Admit: 2014-05-12 | Discharge: 2014-05-13 | DRG: 101 | Disposition: A | Discharge status: Home / Self Care | Payer: Medicaid Other | Attending: Internal Medicine | Admitting: Internal Medicine

## 2014-05-12 DIAGNOSIS — E039 Hypothyroidism, unspecified: Secondary | ICD-10-CM | POA: Diagnosis present

## 2014-05-12 DIAGNOSIS — J309 Allergic rhinitis, unspecified: Secondary | ICD-10-CM | POA: Diagnosis present

## 2014-05-12 DIAGNOSIS — R51 Headache: Secondary | ICD-10-CM | POA: Diagnosis present

## 2014-05-12 DIAGNOSIS — I059 Rheumatic mitral valve disease, unspecified: Secondary | ICD-10-CM | POA: Diagnosis present

## 2014-05-12 DIAGNOSIS — R011 Cardiac murmur, unspecified: Secondary | ICD-10-CM | POA: Diagnosis present

## 2014-05-12 DIAGNOSIS — R569 Unspecified convulsions: Secondary | ICD-10-CM | POA: Diagnosis present

## 2014-05-12 DIAGNOSIS — R110 Nausea: Secondary | ICD-10-CM | POA: Diagnosis present

## 2014-05-12 DIAGNOSIS — Z9119 Patient's noncompliance with other medical treatment and regimen: Secondary | ICD-10-CM

## 2014-05-12 DIAGNOSIS — E785 Hyperlipidemia, unspecified: Secondary | ICD-10-CM | POA: Diagnosis present

## 2014-05-12 DIAGNOSIS — R6252 Short stature (child): Secondary | ICD-10-CM | POA: Diagnosis present

## 2014-05-12 DIAGNOSIS — Z79899 Other long term (current) drug therapy: Secondary | ICD-10-CM | POA: Diagnosis not present

## 2014-05-12 DIAGNOSIS — G4730 Sleep apnea, unspecified: Secondary | ICD-10-CM | POA: Diagnosis present

## 2014-05-12 DIAGNOSIS — F70 Mild intellectual disabilities: Secondary | ICD-10-CM | POA: Diagnosis present

## 2014-05-12 DIAGNOSIS — G40319 Generalized idiopathic epilepsy and epileptic syndromes, intractable, without status epilepticus: Secondary | ICD-10-CM

## 2014-05-12 DIAGNOSIS — Z86718 Personal history of other venous thrombosis and embolism: Secondary | ICD-10-CM | POA: Diagnosis not present

## 2014-05-12 DIAGNOSIS — Z91199 Patient's noncompliance with other medical treatment and regimen due to unspecified reason: Secondary | ICD-10-CM

## 2014-05-12 DIAGNOSIS — I959 Hypotension, unspecified: Secondary | ICD-10-CM | POA: Diagnosis present

## 2014-05-12 DIAGNOSIS — R0602 Shortness of breath: Secondary | ICD-10-CM | POA: Diagnosis present

## 2014-05-12 DIAGNOSIS — H5060 Mechanical strabismus, unspecified: Secondary | ICD-10-CM | POA: Diagnosis present

## 2014-05-12 DIAGNOSIS — G40401 Other generalized epilepsy and epileptic syndromes, not intractable, with status epilepticus: Principal | ICD-10-CM | POA: Diagnosis present

## 2014-05-12 DIAGNOSIS — G40901 Epilepsy, unspecified, not intractable, with status epilepticus: Secondary | ICD-10-CM | POA: Diagnosis present

## 2014-05-12 LAB — COMPREHENSIVE METABOLIC PANEL
ALK PHOS: 97 U/L (ref 39–117)
ALT: 46 U/L (ref 0–53)
AST: 50 U/L — ABNORMAL HIGH (ref 0–37)
Albumin: 3.2 g/dL — ABNORMAL LOW (ref 3.5–5.2)
Anion gap: 16 — ABNORMAL HIGH (ref 5–15)
BUN: 12 mg/dL (ref 6–23)
CO2: 16 mEq/L — ABNORMAL LOW (ref 19–32)
Calcium: 8.4 mg/dL (ref 8.4–10.5)
Chloride: 107 mEq/L (ref 96–112)
Creatinine, Ser: 0.88 mg/dL (ref 0.50–1.35)
GFR calc non Af Amer: >90 mL/min (ref >90)
Glucose, Bld: 109 mg/dL — ABNORMAL HIGH (ref 70–99)
POTASSIUM: 4 meq/L (ref 3.7–5.3)
SODIUM: 139 meq/L (ref 137–147)
Total Protein: 7 g/dL (ref 6.0–8.3)

## 2014-05-12 LAB — CBC
HCT: 37 % — ABNORMAL LOW (ref 39.0–52.0)
Hemoglobin: 12 g/dL — ABNORMAL LOW (ref 13.0–17.0)
MCH: 28.4 pg (ref 26.0–34.0)
MCHC: 32.4 g/dL (ref 30.0–36.0)
MCV: 87.5 fL (ref 78.0–100.0)
PLATELETS: 234 10*3/uL (ref 150–400)
RBC: 4.23 MIL/uL (ref 4.22–5.81)
RDW: 13.5 % (ref 11.5–15.5)
WBC: 5.5 10*3/uL (ref 4.0–10.5)

## 2014-05-12 LAB — MRSA PCR SCREENING: MRSA BY PCR: NEGATIVE

## 2014-05-12 LAB — CARBAMAZEPINE LEVEL, TOTAL: CARBAMAZEPINE LVL: 10.4 ug/mL (ref 4.0–12.0)

## 2014-05-12 MED ORDER — LEVETIRACETAM IN NACL 1500 MG/100ML IV SOLN
1500.0000 mg | Freq: Two times a day (BID) | INTRAVENOUS | Status: DC
  Administered 2014-05-12 – 2014-05-13 (×2): 1500 mg via INTRAVENOUS
  Filled 2014-05-12 (×3): qty 100

## 2014-05-12 MED ORDER — ONDANSETRON HCL 4 MG/2ML IJ SOLN: 4.0000 mg | Freq: Four times a day (QID) | INTRAMUSCULAR | Status: DC | PRN

## 2014-05-12 MED ORDER — ALBUTEROL SULFATE (2.5 MG/3ML) 0.083% IN NEBU: 2.5000 mg | INHALATION_SOLUTION | Freq: Two times a day (BID) | RESPIRATORY_TRACT | Status: DC | PRN

## 2014-05-12 MED ORDER — PHENOBARBITAL SODIUM 65 MG/ML IJ SOLN
65.0000 mg | Freq: Once | INTRAMUSCULAR | Status: AC
  Administered 2014-05-12: 65 mg via INTRAVENOUS
  Filled 2014-05-12: qty 1

## 2014-05-12 MED ORDER — ACETAMINOPHEN 650 MG RE SUPP
650.0000 mg | Freq: Four times a day (QID) | RECTAL | Status: DC | PRN
  Administered 2014-05-12: 650 mg via RECTAL
  Filled 2014-05-12: qty 1

## 2014-05-12 MED ORDER — CARBAMAZEPINE 100 MG PO CHEW
250.0000 mg | CHEWABLE_TABLET | Freq: Once | ORAL | Status: AC
  Administered 2014-05-12: 250 mg via ORAL
  Filled 2014-05-12: qty 2.5

## 2014-05-12 MED ORDER — TOPIRAMATE ER 200 MG PO CAP24: 200.0000 mg | ORAL_CAPSULE | Freq: Every day | ORAL | Status: DC

## 2014-05-12 MED ORDER — ACETAMINOPHEN 325 MG PO TABS: 650.0000 mg | ORAL_TABLET | Freq: Four times a day (QID) | ORAL | Status: DC | PRN

## 2014-05-12 MED ORDER — SODIUM CHLORIDE 0.9 % IV SOLN
50.0000 mg | Freq: Once | INTRAVENOUS | Status: DC
  Filled 2014-05-12: qty 5

## 2014-05-12 MED ORDER — DIAZEPAM 5 MG/ML IJ SOLN
2.5000 mg | Freq: Four times a day (QID) | INTRAMUSCULAR | Status: DC
  Administered 2014-05-12: 2.5 mg via INTRAVENOUS
  Filled 2014-05-12: qty 2

## 2014-05-12 MED ORDER — LORAZEPAM 2 MG/ML IJ SOLN
INTRAMUSCULAR | Status: AC
  Administered 2014-05-12: 2 mg via INTRAVENOUS
  Filled 2014-05-12: qty 1

## 2014-05-12 MED ORDER — LORAZEPAM 2 MG/ML IJ SOLN
INTRAMUSCULAR | Status: AC
  Filled 2014-05-12: qty 1

## 2014-05-12 MED ORDER — CLOBAZAM 10 MG PO TABS
5.0000 mg | ORAL_TABLET | Freq: Once | ORAL | Status: AC
  Administered 2014-05-12: 5 mg via ORAL
  Filled 2014-05-12: qty 0.5

## 2014-05-12 MED ORDER — DIAZEPAM 5 MG/ML IJ SOLN: 2.5000 mg | Freq: Four times a day (QID) | INTRAMUSCULAR | Status: DC

## 2014-05-12 MED ORDER — SODIUM CHLORIDE 0.9 % IV SOLN
50.0000 mg | Freq: Once | INTRAVENOUS | Status: AC
  Administered 2014-05-12: 50 mg via INTRAVENOUS
  Filled 2014-05-12: qty 5

## 2014-05-12 MED ORDER — ONDANSETRON HCL 4 MG/2ML IJ SOLN
4.0000 mg | Freq: Once | INTRAMUSCULAR | Status: AC
  Administered 2014-05-12: 4 mg via INTRAVENOUS
  Filled 2014-05-12: qty 2

## 2014-05-12 MED ORDER — FENTANYL CITRATE 0.05 MG/ML IJ SOLN: 50.0000 ug | Freq: Once | INTRAMUSCULAR | Status: DC

## 2014-05-12 MED ORDER — TOPIRAMATE ER 50 MG PO CAP24: 100.0000 mg | ORAL_CAPSULE | Freq: Every day | ORAL | Status: DC

## 2014-05-12 MED ORDER — LORAZEPAM 2 MG/ML IJ SOLN
INTRAMUSCULAR | Status: AC
  Administered 2014-05-12: 2 mg
  Filled 2014-05-12: qty 1

## 2014-05-12 MED ORDER — SODIUM CHLORIDE 0.9 % IV SOLN
INTRAVENOUS | Status: AC
  Administered 2014-05-12: 16:00:00 via INTRAVENOUS

## 2014-05-12 MED ORDER — LORAZEPAM 2 MG/ML IJ SOLN
2.0000 mg | Freq: Once | INTRAMUSCULAR | Status: AC
  Administered 2014-05-12: 2 mg via INTRAVENOUS

## 2014-05-12 MED ORDER — SODIUM CHLORIDE 0.9 % IV SOLN
INTRAVENOUS | Status: DC
  Administered 2014-05-12: 19:00:00 via INTRAVENOUS

## 2014-05-12 MED ORDER — LEVOTHYROXINE SODIUM 100 MCG IV SOLR
25.0000 ug | Freq: Every day | INTRAVENOUS | Status: DC
  Filled 2014-05-12 (×2): qty 5

## 2014-05-12 MED ORDER — DIAZEPAM 5 MG/ML IJ SOLN: 2.5000 mg | Freq: Four times a day (QID) | INTRAMUSCULAR | Status: DC | PRN

## 2014-05-12 MED ORDER — LEVETIRACETAM IN NACL 1000 MG/100ML IV SOLN
1000.0000 mg | Freq: Once | INTRAVENOUS | Status: AC
  Administered 2014-05-12: 1000 mg via INTRAVENOUS
  Filled 2014-05-12: qty 100

## 2014-05-12 MED ORDER — LORAZEPAM 2 MG/ML IJ SOLN
INTRAMUSCULAR | Status: AC
  Administered 2014-05-12: 2 mg via INTRAVENOUS
  Filled 2014-05-12: qty 2

## 2014-05-12 MED ORDER — SODIUM CHLORIDE 0.9 % IV BOLUS (SEPSIS)
1000.0000 mL | Freq: Once | INTRAVENOUS | Status: AC
  Administered 2014-05-12: 1000 mL via INTRAVENOUS

## 2014-05-12 MED ORDER — SODIUM CHLORIDE 0.9 % IV SOLN: 100.0000 mg | Freq: Once | INTRAVENOUS | Status: DC

## 2014-05-12 MED ORDER — FENTANYL CITRATE 0.05 MG/ML IJ SOLN: 50.0000 ug | INTRAMUSCULAR | Status: DC | PRN

## 2014-05-12 MED ORDER — CLOBAZAM 10 MG PO TABS: 2.5000 mg | ORAL_TABLET | Freq: Three times a day (TID) | ORAL | Status: DC

## 2014-05-12 MED ORDER — CARBAMAZEPINE 100 MG PO CHEW
250.0000 mg | CHEWABLE_TABLET | Freq: Three times a day (TID) | ORAL | Status: DC
  Filled 2014-05-12 (×4): qty 2.5

## 2014-05-12 MED ORDER — CLOBAZAM 10 MG PO TABS: 5.0000 mg | ORAL_TABLET | Freq: Three times a day (TID) | ORAL | Status: DC

## 2014-05-12 MED ORDER — ONDANSETRON HCL 4 MG/2ML IJ SOLN: 4.0000 mg | Freq: Three times a day (TID) | INTRAMUSCULAR | Status: AC | PRN

## 2014-05-12 MED ORDER — ONDANSETRON HCL 4 MG PO TABS: 4.0000 mg | ORAL_TABLET | Freq: Four times a day (QID) | ORAL | Status: DC | PRN

## 2014-05-12 MED ORDER — SODIUM CHLORIDE 0.9 % IV SOLN
100.0000 mg | Freq: Two times a day (BID) | INTRAVENOUS | Status: DC
  Administered 2014-05-12 – 2014-05-13 (×2): 100 mg via INTRAVENOUS
  Filled 2014-05-12 (×4): qty 10

## 2014-05-12 NOTE — ED Provider Notes (Signed)
I saw and evaluated the patient, reviewed the resident's note and I agree with the findings and plan.   .Face to face Exam:  General:  Awake HEENT:  Atraumatic Resp:  Normal effort Abd:  Nondistended Neuro:No focal weakness   Ekg was reviewed and discussed with resident  Dot Lanes, MD 05/12/14 1535

## 2014-05-12 NOTE — ED Notes (Signed)
Pt with grand mal seizure x 2.5 minutes.  Dr Audie Pinto at bedside.  2 mg ativan given and pt responded almost immediately.  Presently pt ao x 4.  States he can see.

## 2014-05-12 NOTE — ED Provider Notes (Signed)
CSN: [id number]     Arrival date & time 05/12/14  1026 History   First MD Initiated Contact with Patient 05/12/14 1027    No chief complaint on file.  HPI  Patient presents after prolonged seizure this morning, arrived via EMS. Mother provided history at bedside. Described episode with patient at normal baseline this morning until he went back to bed and was found to be seizing while laying in bed when she checked on him, similar seizure as prior [place] Mal episodes, with whole body tense "locked up, with arms up" and unresponsive, eyes open and fixed upwards, did not fall and did not hit head. Mom describes that patient continued seizing and then seemed to go limp and unresponsive, she did not feel a pulse, she started CPR after 5-6 minutes after lowering patient to the floor, called EMS, patient responded and started to breath spontaneously. EMS arrival, patient without active seizure, brought to ED for evaluation. Did not receive any Ativan, Phenobarb or any anti-epileptic medication during episode or prior to arrival today. Was not given any of routine daily anti-epileptics yet today. Mother estimated entire seizure lasted 20-25 minutes, similar to prior episodes, currently patient post-ictal state and drowsy, admits to some nausea/vomiting bloody sputum. Additional report of loss of vision following seizure with headache, mother admits to prior similar headaches with vision loss 1 year ago and recently 6 weeks ago, usually self-limiting and not associated with seizures.  Denies recent fever/chills, HA, CP, SOB, abdominal pain. +Reported sick contact with 40 month old relative with diarrhea, n/v.  Significant known hx of congenital seizure disorder, followed by Dr. Gaynell Face (Peds Neuro, well established), takes Tegretol, Keppra, [name], Trokendi XR, Vimpat for seizures, has a Vagal Nerve Stimulator implant. Recent course with hospitalization on 04/25/14 for observation following similar prolonged seizure  episode (admits that current seizure was the longest ever, last 2 seizures were worst), normally averages 1 seizure every 2 weeks, avg 5-8 min. Has follow-up scheduled with Dr. Gaynell Face on 05/16/14.  Past Medical History  Diagnosis Date  . ALLERGIC RHINITIS 12/26/2009  . MITRAL VALVE PROLAPSE 06/22/2007  . SEIZURE DISORDER 06/22/2007  . Unspecified hypothyroidism 06/22/2007  . Growth disorder   . Mental retardation, mild (I.Q. 50-70)   . Brown's syndrome   . Heart murmur     moderate MR  . Sleep apnea     cpap , last sleep study 10/01/11  . Hypokalemia   . Hyperlipidemia     No therapy  . Blood clot in vein    Past Surgical History  Procedure Laterality Date  . Implantation vagal nerve stimulator      Left  . Portacath placement      and removal 04/08/12  . Eye surgery      X2 for Brown Syndrome   Family History  Problem Relation Age of Onset  . Breast cancer Mother   . Asthma Father   . Diabetes Father   . Coronary artery disease Maternal Grandfather   . Coronary artery disease Paternal Grandfather   . Lung cancer Paternal Grandfather     Died at 54  . Pneumonia Paternal Grandmother     Died at 57  . Stroke Paternal Grandmother   . Congestive Heart Failure Maternal Grandfather     Died at 13  . Pneumonia Maternal Grandfather    History  Substance Use Topics  . Smoking status: Never Smoker   . Smokeless tobacco: Never Used  . Alcohol Use: No  Review of Systems  See above HPI   Allergies  Antihistamines, chlorpheniramine-type; Divalproex sodium; Phenytoin; Valproic acid and related; and Vancomycin  Home Medications   Prior to Admission medications   Medication Sig Start Date End Date Taking? Authorizing Provider  carbamazepine (TEGRETOL) 100 MG chewable tablet Chew 250 mg by mouth 3 (three) times daily.   Yes Historical Provider, MD  cetirizine (ZYRTEC) 10 MG tablet Take 10 mg by mouth daily.   Yes Historical Provider, MD  cloBAZam ([name]) 10 MG tablet Take 5  mg by mouth 3 (three) times daily.   Yes Historical Provider, MD  diazepam (VALIUM) 2 MG tablet Take 2 mg by mouth every 6 (six) hours as needed (for seizures).   Yes Historical Provider, MD  lacosamide (VIMPAT) 50 MG TABS tablet Take 50-100 mg by mouth 3 (three) times daily. Take 1 tablet (50 mg) daily at 8:30 am and 1:30pm, take 2 tablets (100 mg) daily at bedtime: 9:30pm   Yes Historical Provider, MD  levETIRAcetam (KEPPRA) 500 MG tablet Take 1,500 mg by mouth 2 (two) times daily.   Yes Historical Provider, MD  levothyroxine (SYNTHROID, LEVOTHROID) 50 MCG tablet Take 50 mcg by mouth daily.    Yes Historical Provider, MD  LORazepam (ATIVAN) 2 MG/ML concentrated solution Withdraw 48ml (2mg ) and 6ml  Normal saline into 3 ml syringe. Administer IV push over 3-5 minutes as needed for recurrent seizures 10/13/13  Yes Rockwell Germany, NP  Multiple Vitamin (MULTIVITAMIN WITH MINERALS) TABS Take 1 tablet by mouth daily.   Yes Historical Provider, MD  ondansetron (ZOFRAN ODT) 8 MG disintegrating tablet Take 1 tablet (8 mg total) by mouth every 8 (eight) hours as needed for nausea or vomiting. 04/21/14  Yes Richarda Blade, MD  potassium chloride SA (K-DUR,KLOR-CON) 20 MEQ tablet Take 20 mEq by mouth 4 (four) times daily.   Yes Historical Provider, MD  potassium phosphate, monobasic, (K-PHOS ORIGINAL) 500 MG tablet Take 1 tablet (500 mg total) by mouth 4 (four) times daily. 10/26/13  Yes Jodi Geralds, MD  Topiramate ER (TROKENDI XR) 200 MG CP24 Take 200 mg by mouth at bedtime.   Yes Historical Provider, MD  Topiramate ER (TROKENDI XR) 50 MG CP24 Take 100 mg by mouth at bedtime.   Yes Historical Provider, MD  acetaminophen (TYLENOL) 500 MG tablet Take 500 mg by mouth 2 (two) times daily as needed for moderate pain (pain).     Historical Provider, MD  albuterol (PROVENTIL) (2.5 MG/3ML) 0.083% nebulizer solution Take 2.5 mg by nebulization 2 (two) times daily as needed for wheezing or shortness of breath.   12/09/13 12/09/14  Historical Provider, MD  PHENObarbital (LUMINAL) 65 MG/ML injection Withdraw 47ml (65mg ) Phenobarbital and 66ml Normal Saline into 51ml syringe. Adminster IV push for 3-5 minutes as needed for recurrent seizures 10/13/13   Rockwell Germany, NP   BP 101/66  Pulse 104  Resp 23  SpO2 100% Physical Exam  Gen - post-ictal state, sitting up, no active seizures, cooperative, NAD HEENT - NCAT, sluggish reactive pupils b/l with multidirectional non-coordinated eye movements, oropharynx with some blood-tinged sputum without obvious identification of laceration, MMM Neck - supple, non-tender Heart - RRR, no murmurs heard Lungs - CTAB, no wheezing, crackles, or rhonchi. Normal work of breathing. Abd - soft, generalized abdominal discomfort on exam, unable to localize tenderness, no masses, +active BS Ext - non-tender, no edema, peripheral pulses intact +2 b/l Skin - warm, dry, no rashes Neuro - drowsy, alert, interactive, grossly non-focal, intact  muscle strength 5/5 b/l, intact distal sensation to light touch   ED Course  Procedures (including critical care time) Labs Review Labs Reviewed  COMPREHENSIVE METABOLIC PANEL - Abnormal; Notable for the following:    CO2 16 (*)    Glucose, Bld 109 (*)    Albumin 3.2 (*)    AST 50 (*)    Total Bilirubin <0.2 (*)    Anion gap 16 (*)    All other components within normal limits  CBC - Abnormal; Notable for the following:    Hemoglobin 12.0 (*)    HCT 37.0 (*)    All other components within normal limits  CARBAMAZEPINE LEVEL, TOTAL  LEVETIRACETAM LEVEL    Imaging Review Dg Chest Portable 1 View  05/12/2014   CLINICAL DATA:  Seizure.  Shortness of breath.  EXAM: PORTABLE CHEST - 1 VIEW  COMPARISON:  PA and lateral chest 04/25/2014 and 08/02/2013.  FINDINGS: [name] and stimulator device remain in place. Lungs are clear. Heart size is normal. No pneumothorax or pleural effusion. Bones are osteopenic.  IMPRESSION: No acute disease.   Stable compared to prior exam.   Electronically Signed   By: Inge Rise M.D.   On: 05/12/2014 11:48     EKG Interpretation   Date/Time:  Friday May 12 2014 10:47:45 EDT Ventricular Rate:  105 PR Interval:  173 QRS Duration: 95 QT Interval:  344 QTC Calculation: 455 R Axis:   59 Text Interpretation:  Sinus tachycardia Otherwise normal ECG Confirmed by  BEATON  MD, ROBERT ([id number]) on 05/12/2014 10:50:45 AM      MDM   Final diagnoses:  Status epilepticus   56 yr M with PMH known congenital seizure disorder, s/p vagal nerve stimulator, hx intellectual disability, presents following acute seizure episode consistent with status epilepticus (estimated >5 min), did not receive any anti-epileptics PTA, and not received daily AEDs today. Currently appears to be post-ictal, stable vitals, cooperative, no active seizing in ED, with some blood-tinged sputum and emesis. Some loss of vision with headache. Denies CP, SOB. Significant recent course with similar prolonged seizure about 2 weeks ago, hospitalized for observation without any noted med changes. Current seizure longer than previous episodes.  Proceed with CMET, CBC, Tegretol and Keppra levels, Portable CXR. Ordered Keppra load 1000mg  IV x 1 dose.  UPDATE @ 1355 - Patient with some persistent nausea, reported multiple episodes of small amount blood tinged emesis. Remains drowsy, improved alertness, vision improved. Neurology consulted currently in ED to evaluate patient. Review results with CMET unremarkable except low bicarb 16 (previously low nml 19-20), CBC stable with nml WBC 5.5, Hgb 12.0 stable, Tegretol level 10.4 (stable), EKG and CXR without acute findings. Ordered Zofran 4mg  IV x 1 dose.  UPDATE @ 1415 - Discussed with Neurology, recommended resuming all home anti-epileptics at this time, anticipate eventual admission for observation. Ordered Tegretol 250mg  PO x 1, Vimpat 5mg  IV x 1, and [name] 5mg  PO x 1 dose.  UPDATE @ 55 -  Paged [facility] team for admission, Neurology to continue to follow, ordered Fentanyl 56mcg IV x 1 dose for abdominal pain. Placed temporary admission orders to observation.  Nobie Putnam, DO 05/12/14 1526

## 2014-05-12 NOTE — ED Notes (Signed)
Pt with witnessed seizure x 1 minute.  Dr at bedside.  Ativan given.

## 2014-05-12 NOTE — ED Notes (Signed)
Dr Nicole Kindred at bedside.  Clobazam 5 mg wasted with rn Melissa.

## 2014-05-12 NOTE — ED Notes (Signed)
Meds still not up from pharmacy.  Pharmacy called.

## 2014-05-12 NOTE — ED Notes (Signed)
PT removed from O2 d/t no sob and sats in upper 90's

## 2014-05-12 NOTE — Telephone Encounter (Signed)
Doris, mom, stated today, the pt had a seizure this morning that lasted 25 minutes. She said about 5 minutes into the seizure, she started cpr on the pt. She said it took quite a few chest compressions until the pt took a breath. The mother stated his face was blue and puffy and couldn't find a pulse. She said the ambulance is on the way. She can be reached on her cell # [phone].

## 2014-05-12 NOTE — ED Notes (Signed)
PT with 2nd witnessed seizure.  Dr Karleen Hampshire at bedside.  Ativan given.  Seizure lasted approx 1 minute.

## 2014-05-12 NOTE — ED Notes (Signed)
Per EMS: hx of seizure, mom reported grand mal lasting 4-6 minutes, according to her he had no pulse, and she performed 6 minutes of CPR, upon EMS arrival he was breathing spontaneously and pulses present, laying supine and his breath sounds were rhoncus left side, answering questions appropriately.   Patient had coughing fit with bloody sputum.

## 2014-05-12 NOTE — Consult Note (Signed)
NEURO HOSPITALIST CONSULT NOTE    Reason for Consult: break through seizure  HPI:                                                                                                                                          [name] is an 28 y.o. male with known seizure disorder followed closely by Dr. Gaynell Face as a out patient. HE was recently admitted to hospital for break through seizure on 04/25/2014.  He was admitted at that time for observation.  He remained seizure free while in hospital. His medications were not changed and he was discharged to follow up with Dr. Gaynell Face.  He was scheduled to see Dr. Gaynell Face on 05/16/2014. Patient again had break through seizure while at home.  Again he had a brief peroid of apnea and mother started chest compressions just prior to him spontaneously breathing. Mother states his seizure lasted for 25 minutes and was describes as bilateral arms flexed and legs extended.  Per notes "upon EMS arrival he was breathing spontaneously and pulses present, laying supine and his breath sounds were rhoncus left side, answering questions appropriately. " .  Currently he is back to his baseline and able to follow all commands.   Current medications: Tegretol 250 mg TID Valium 2 mg Q6 hours Keppra 1500 mg BID Vimpat 50 mg 0830 and 1330/  Then 100 mg Qhs Topiramate ER 200 mg QHS Phenobarbital 65 mg IV PRN for seizure.   Levels: Tegretol 10.4 Keppra Pending  Past Medical History  Diagnosis Date  . ALLERGIC RHINITIS 12/26/2009  . MITRAL VALVE PROLAPSE 06/22/2007  . SEIZURE DISORDER 06/22/2007  . Unspecified hypothyroidism 06/22/2007  . Growth disorder   . Mental retardation, mild (I.Q. 50-70)   . Brown's syndrome   . Heart murmur     moderate MR  . Sleep apnea     cpap , last sleep study 10/01/11  . Hypokalemia   . Hyperlipidemia     No therapy  . Blood clot in vein     Past Surgical History  Procedure Laterality Date  .  Implantation vagal nerve stimulator      Left  . Portacath placement      and removal 04/08/12  . Eye surgery      X2 for Brown Syndrome    Family History  Problem Relation Age of Onset  . Breast cancer Mother   . Asthma Father   . Diabetes Father   . Coronary artery disease Maternal Grandfather   . Coronary artery disease Paternal Grandfather   . Lung cancer Paternal Grandfather     Died at 92  . Pneumonia Paternal Grandmother     Died at 42  . Stroke Paternal Grandmother   . Congestive Heart  Failure Maternal Grandfather     Died at 18  . Pneumonia Maternal Grandfather      Social History:  reports that he has never smoked. He has never used smokeless tobacco. He reports that he does not drink alcohol or use illicit drugs.  Allergies  Allergen Reactions  . Antihistamines, Chlorpheniramine-Type Other (See Comments)    Other Reaction: Dimetapp causes seizures  . Divalproex Sodium Other (See Comments)    Nausea, vomiting, liver and kidney dysfunction  . Phenytoin Rash and Other (See Comments)    Dilantin causes more seizures.   . Valproic Acid And Related Other (See Comments)    Shuts down systems  . Vancomycin Rash and Other (See Comments)    Other Reaction: Redman's Syndrome    MEDICATIONS:                                                                                                                     No current facility-administered medications for this encounter.   Current Outpatient Prescriptions  Medication Sig Dispense Refill  . carbamazepine (TEGRETOL) 100 MG chewable tablet Chew 250 mg by mouth 3 (three) times daily.      . cetirizine (ZYRTEC) 10 MG tablet Take 10 mg by mouth daily.      . cloBAZam ([name]) 10 MG tablet Take 5 mg by mouth 3 (three) times daily.      . diazepam (VALIUM) 2 MG tablet Take 2 mg by mouth every 6 (six) hours as needed (for seizures).      Marland Kitchen lacosamide (VIMPAT) 50 MG TABS tablet Take 50-100 mg by mouth 3 (three) times daily. Take 1  tablet (50 mg) daily at 8:30 am and 1:30pm, take 2 tablets (100 mg) daily at bedtime: 9:30pm      . levETIRAcetam (KEPPRA) 500 MG tablet Take 1,500 mg by mouth 2 (two) times daily.      Marland Kitchen levothyroxine (SYNTHROID, LEVOTHROID) 50 MCG tablet Take 50 mcg by mouth daily.       Marland Kitchen LORazepam (ATIVAN) 2 MG/ML concentrated solution Withdraw 29ml (2mg ) and 87ml  Normal saline into 3 ml syringe. Administer IV push over 3-5 minutes as needed for recurrent seizures  5 mL  5  . Multiple Vitamin (MULTIVITAMIN WITH MINERALS) TABS Take 1 tablet by mouth daily.      . ondansetron (ZOFRAN ODT) 8 MG disintegrating tablet Take 1 tablet (8 mg total) by mouth every 8 (eight) hours as needed for nausea or vomiting.  20 tablet  0  . potassium chloride SA (K-DUR,KLOR-CON) 20 MEQ tablet Take 20 mEq by mouth 4 (four) times daily.      . potassium phosphate, monobasic, (K-PHOS ORIGINAL) 500 MG tablet Take 1 tablet (500 mg total) by mouth 4 (four) times daily.  124 tablet  5  . Topiramate ER (TROKENDI XR) 200 MG CP24 Take 200 mg by mouth at bedtime.      . Topiramate ER (TROKENDI XR) 50 MG CP24 Take 100 mg by mouth at bedtime.      Marland Kitchen  acetaminophen (TYLENOL) 500 MG tablet Take 500 mg by mouth 2 (two) times daily as needed for moderate pain (pain).       Marland Kitchen albuterol (PROVENTIL) (2.5 MG/3ML) 0.083% nebulizer solution Take 2.5 mg by nebulization 2 (two) times daily as needed for wheezing or shortness of breath.       Marland Kitchen PHENObarbital (LUMINAL) 65 MG/ML injection Withdraw 2ml (65mg ) Phenobarbital and 19ml Normal Saline into 24ml syringe. Adminster IV push for 3-5 minutes as needed for recurrent seizures  5 mL  5      ROS:                                                                                                                                       History obtained from the patient  General ROS: negative for - chills, fatigue, fever, night sweats, weight gain or weight loss Psychological ROS: negative for - behavioral disorder,  hallucinations, memory difficulties, mood swings or suicidal ideation Ophthalmic ROS: negative for - blurry vision, double vision, eye pain or loss of vision ENT ROS: negative for - epistaxis, nasal discharge, oral lesions, sore throat, tinnitus or vertigo Allergy and Immunology ROS: negative for - hives or itchy/watery eyes Hematological and Lymphatic ROS: negative for - bleeding problems, bruising or swollen lymph nodes Endocrine ROS: negative for - galactorrhea, hair pattern changes, polydipsia/polyuria or temperature intolerance Respiratory ROS: negative for - cough, hemoptysis, shortness of breath or wheezing Cardiovascular ROS: negative for - chest pain, dyspnea on exertion, edema or irregular heartbeat Gastrointestinal ROS: negative for - abdominal pain, diarrhea, hematemesis, nausea/vomiting or stool incontinence Genito-Urinary ROS: negative for - dysuria, hematuria, incontinence or urinary frequency/urgency Musculoskeletal ROS: negative for - joint swelling or muscular weakness Neurological ROS: as noted in HPI Dermatological ROS: negative for rash and skin lesion changes   Blood pressure 101/66, pulse 104, resp. rate 23, SpO2 100.00%.   Neurologic Examination:                                                                                                      General: NAD Mental Status: Alert, oriented,knows he is in the hospital but does not know date year (this is his baseline).  Speech intact without evidence of aphasia.  Able to follow 3 step commands without difficulty. Cranial Nerves: II: Discs flat bilaterally; Visual fields grossly normal, pupils equal, round, reactive to light and accommodation Perez,IV, VI: ptosis not present, extra-ocular motions intact bilaterally V,VII: smile symmetric, facial light touch sensation normal bilaterally  VIII: hearing normal bilaterally IX,X: gag reflex present XI: bilateral shoulder shrug XII: midline tongue extension without atrophy or  fasciculations  Motor: Moves limbs symmetrically 4/5 Sensory: Pinprick and light touch intact throughout, bilaterally Deep Tendon Reflexes:  Depressed 1+ UE and in KJ no AJ  Plantars: Right: downgoing   Left: downgoing Cerebellar: normal finger-to-nose,  normal heel-to-shin test Gait: not tested CV: pulses palpable throughout    Lab Results: Basic Metabolic Panel:  Recent Labs Lab 05/12/14 1115  NA 139  K 4.0  CL 107  CO2 16*  GLUCOSE 109*  BUN 12  CREATININE 0.88  CALCIUM 8.4    Liver Function Tests:  Recent Labs Lab 05/12/14 1115  AST 50*  ALT 46  ALKPHOS 97  BILITOT <0.2*  PROT 7.0  ALBUMIN 3.2*   No results found for this basename: LIPASE, AMYLASE,  in the last 168 hours No results found for this basename: AMMONIA,  in the last 168 hours  CBC:  Recent Labs Lab 05/12/14 1115  WBC 5.5  HGB 12.0*  HCT 37.0*  MCV 87.5  PLT 234    Cardiac Enzymes: No results found for this basename: CKTOTAL, CKMB, CKMBINDEX, TROPONINI,  in the last 168 hours  Lipid Panel: No results found for this basename: CHOL, TRIG, HDL, CHOLHDL, VLDL, LDLCALC,  in the last 168 hours  CBG: No results found for this basename: GLUCAP,  in the last 168 hours  Microbiology: Results for orders placed during the hospital encounter of 02/26/13  CULTURE, BLOOD (ROUTINE X 2)     Status: None   Collection Time    02/26/13  9:00 AM      Result Value Ref Range Status   Specimen Description BLOOD RIGHT ARM   Final   Special Requests BOTTLES DRAWN AEROBIC ONLY 10CC   Final   Culture  Setup Time 02/26/2013 13:48   Final   Culture NO GROWTH 5 DAYS   Final   Report Status 03/04/2013 FINAL   Final  CULTURE, BLOOD (ROUTINE X 2)     Status: None   Collection Time    02/26/13  9:18 AM      Result Value Ref Range Status   Specimen Description BLOOD RIGHT ARM   Final   Special Requests BOTTLES DRAWN AEROBIC ONLY 10CC   Final   Culture  Setup Time 02/26/2013 13:48   Final   Culture NO  GROWTH 5 DAYS   Final   Report Status 03/04/2013 FINAL   Final    Coagulation Studies: No results found for this basename: LABPROT, INR,  in the last 72 hours  Imaging: Dg Chest Portable 1 View  05/12/2014   CLINICAL DATA:  Seizure.  Shortness of breath.  EXAM: PORTABLE CHEST - 1 VIEW  COMPARISON:  PA and lateral chest 04/25/2014 and 08/02/2013.  FINDINGS: [name] and stimulator device remain in place. Lungs are clear. Heart size is normal. No pneumothorax or pleural effusion. Bones are osteopenic.  IMPRESSION: No acute disease.  Stable compared to prior exam.   Electronically Signed   By: Inge Rise M.D.   On: 05/12/2014 11:48       Assessment and plan per attending neurologist  Etta Quill PA-C [facility] [phone]  05/12/2014, 1:31 PM   Assessment/Plan: 28 YO male with known seizure disorder on multiple seizure medications presenting with breakthrough recurrent seizures.  Current tegretol level is 10.3. At present time he is back to his baseline showing no seizure activity. Due to duration of  seizure and recent hospitalization would keep patient over night for observation and medical step down unit.   Recommend: 1) Continue his home doses of:    Tegretol 250 mg TID   Valium 2 mg Q6 hours   Keppra 1500 mg BID   Topiramate ER 200 mg QHS 2) Increase dose of Vimpat to 100 mg at 2876,8115 and QHS 3) Hospitalist to admit for over night obseervation 4) May give a one-time administration of phenobarbital 65 mg IV the patient has a recurrent seizure, as he has had favorable response to this medication for seizure control in the setting of multiple recurrent, difficult to manage seizures.  We will continue to follow this patient closely with you.  I personally in spite of this patient's evaluation and management, including formulating the above clinical impression and management recommendations.  Rush Farmer M.D. [facility] [phone]

## 2014-05-12 NOTE — Telephone Encounter (Signed)
I called Mom. EMS arrived, [name] is in ER now, complaining of severe headache. TG

## 2014-05-12 NOTE — Progress Notes (Addendum)
Called to room by father pt having seizure activity, lasting aprox. 1-2 mins. Ativan 2mg  and phenobarbital given as per order. Parents at bedside. No distress noted. Will continue to monitor. Dr. Sherral Hammers notified by the Charge nurse.

## 2014-05-12 NOTE — Progress Notes (Signed)
[name] OZY:[id number] DOB: September 11, 1986 DOA: 05/12/2014 PCP: Chesley Noon, MD   Subj: [name] is a 28 y.o. male with h/o seizures, follows up with Dr Gaynell Face as outpatient, was brought in by EMS for active seizures. As per the father at bedside, he missed medications last night and this morning and he had breakthrough seizures at home and had a brief episode of apnea, duing which mother did cpr on him. By the time EMS got there he was breathing spontaneously and was brought to ED. He had another 2 episodes of witnessed seizures , grand mal. He reports mld headache, . Nausea improved after zofran. No vomiting. He is alert and appears to be at baseline. He is following simple commands. His seizures in ED lasted less than 2 minutes where he pulls his arms close to his chest and extends his legs. No tongue biting. Neurology consulted and recommendations given. He is referred to medical service for admission. He will be admitted to step down.     Obj: Objective: VITAL SIGNS: Temp: 97.5 F (36.4 C) (08/21 2330) Temp src: Axillary (08/21 2330) BP: 89/51 mmHg (08/21 2330) Pulse Rate: 75 (08/21 2330) SPO2; FIO2:   Intake/Output Summary (Last 24 hours) at 05/12/14 2346 Last data filed at 05/12/14 2200  Gross per 24 hour  Intake    400 ml  Output    100 ml  Net    300 ml     Exam: General: N/A Lungs: N/A  Cardiovascular: N/A  Abdomen: N/A  Extremities: N/A   Procedure/Significant Events:    Culture   Antibiotics: NA   A/P Seizures recurrent -At approximately 2000 notified patient had a 1 minute episode of seizure which spontaneously resolved. -No further seizure activity this evening. RN to notify me if patient has any additional seizure activity. We will contact neurology for any additional episodes sustained seizure activity -Per neurology's note from 8/21;Continue his home doses of:   Tegretol 250 mg TID   Valium 2 mg Q6 hours    Keppra 1500 mg BID   Topiramate ER 200 mg QHS    Increase dose of Vimpat to 100 mg at 0488,8916 and QHS   May give a one-time administration of phenobarbital 65 mg IV; administered at 1903   Hypotension -Normal saline 1 L bolus; then normal saline at 125ml/hr

## 2014-05-12 NOTE — H&P (Signed)
[facility] History and Physical  [name] GLO:[id number] DOB: 27-Jan-1986 DOA: 05/12/2014  Referring physician: edp PCP: Chesley Noon, MD   Chief Complaint: seizures at home  HPI: [name] is a 28 y.o. male with h/o seizures, follows up with Dr Gaynell Face as outpatient, was brought in by EMS for active seizures. As per the father at bedside, he missed medications last night and this morning and he had breakthrough seizures at home and had a brief episode of apnea, duing which mother did cpr on her. By the time EMS got there he was breathing spontaneously and was brought to ED. He had another 2 episodes of witnessed seizures , grand mal. He reports mld headache, . Nausea improved after zofran. No vomiting. He is alert and appears to be at baseline. He is following simple commands. His seizures in ED lasted less than 2 minutes where he pulls his arms close to his chest and extends his legs. No tongue biting. Neurology consulted and recommendations given. He is referred to medical service for admission. He will be admitted to step down.    Review of Systems:  See hpi otherwise negative.   Past Medical History  Diagnosis Date  . ALLERGIC RHINITIS 12/26/2009  . MITRAL VALVE PROLAPSE 06/22/2007  . SEIZURE DISORDER 06/22/2007  . Unspecified hypothyroidism 06/22/2007  . Growth disorder   . Mental retardation, mild (I.Q. 50-70)   . Brown's syndrome   . Heart murmur     moderate MR  . Sleep apnea     cpap , last sleep study 10/01/11  . Hypokalemia   . Hyperlipidemia     No therapy  . Blood clot in vein    Past Surgical History  Procedure Laterality Date  . Implantation vagal nerve stimulator      Left  . Portacath placement      and removal 04/08/12  . Eye surgery      X2 for Brown Syndrome   Social History:  reports that he has never smoked. He has never used smokeless tobacco. He reports that he does not drink alcohol or use illicit  drugs.  Allergies  Allergen Reactions  . Antihistamines, Chlorpheniramine-Type Other (See Comments)    Other Reaction: Dimetapp causes seizures  . Divalproex Sodium Other (See Comments)    Nausea, vomiting, liver and kidney dysfunction  . Phenytoin Rash and Other (See Comments)    Dilantin causes more seizures.   . Valproic Acid And Related Other (See Comments)    Shuts down systems  . Vancomycin Rash and Other (See Comments)    Other Reaction: Redman's Syndrome    Family History  Problem Relation Age of Onset  . Breast cancer Mother   . Asthma Father   . Diabetes Father   . Coronary artery disease Maternal Grandfather   . Coronary artery disease Paternal Grandfather   . Lung cancer Paternal Grandfather     Died at 75  . Pneumonia Paternal Grandmother     Died at 35  . Stroke Paternal Grandmother   . Congestive Heart Failure Maternal Grandfather     Died at 25  . Pneumonia Maternal Grandfather      Prior to Admission medications   Medication Sig Start Date End Date Taking? Authorizing Provider  carbamazepine (TEGRETOL) 100 MG chewable tablet Chew 250 mg by mouth 3 (three) times daily.   Yes Historical Provider, MD  cetirizine (ZYRTEC) 10 MG tablet Take 10 mg by mouth daily.   Yes  Historical Provider, MD  cloBAZam ([name]) 10 MG tablet Take 5 mg by mouth 3 (three) times daily.   Yes Historical Provider, MD  diazepam (VALIUM) 2 MG tablet Take 2 mg by mouth every 6 (six) hours as needed (for seizures).   Yes Historical Provider, MD  lacosamide (VIMPAT) 50 MG TABS tablet Take 50-100 mg by mouth 3 (three) times daily. Take 1 tablet (50 mg) daily at 8:30 am and 1:30pm, take 2 tablets (100 mg) daily at bedtime: 9:30pm   Yes Historical Provider, MD  levETIRAcetam (KEPPRA) 500 MG tablet Take 1,500 mg by mouth 2 (two) times daily.   Yes Historical Provider, MD  levothyroxine (SYNTHROID, LEVOTHROID) 50 MCG tablet Take 50 mcg by mouth daily.    Yes Historical Provider, MD  LORazepam  (ATIVAN) 2 MG/ML concentrated solution Withdraw 80ml (2mg ) and 5ml  Normal saline into 3 ml syringe. Administer IV push over 3-5 minutes as needed for recurrent seizures 10/13/13  Yes Rockwell Germany, NP  Multiple Vitamin (MULTIVITAMIN WITH MINERALS) TABS Take 1 tablet by mouth daily.   Yes Historical Provider, MD  ondansetron (ZOFRAN ODT) 8 MG disintegrating tablet Take 1 tablet (8 mg total) by mouth every 8 (eight) hours as needed for nausea or vomiting. 04/21/14  Yes Richarda Blade, MD  potassium chloride SA (K-DUR,KLOR-CON) 20 MEQ tablet Take 20 mEq by mouth 4 (four) times daily.   Yes Historical Provider, MD  potassium phosphate, monobasic, (K-PHOS ORIGINAL) 500 MG tablet Take 1 tablet (500 mg total) by mouth 4 (four) times daily. 10/26/13  Yes Jodi Geralds, MD  Topiramate ER (TROKENDI XR) 200 MG CP24 Take 200 mg by mouth at bedtime.   Yes Historical Provider, MD  Topiramate ER (TROKENDI XR) 50 MG CP24 Take 100 mg by mouth at bedtime.   Yes Historical Provider, MD  acetaminophen (TYLENOL) 500 MG tablet Take 500 mg by mouth 2 (two) times daily as needed for moderate pain (pain).     Historical Provider, MD  albuterol (PROVENTIL) (2.5 MG/3ML) 0.083% nebulizer solution Take 2.5 mg by nebulization 2 (two) times daily as needed for wheezing or shortness of breath.  12/09/13 12/09/14  Historical Provider, MD  PHENObarbital (LUMINAL) 65 MG/ML injection Withdraw 73ml (65mg ) Phenobarbital and 23ml Normal Saline into 71ml syringe. Adminster IV push for 3-5 minutes as needed for recurrent seizures 10/13/13   Rockwell Germany, NP   Physical Exam: Filed Vitals:   05/12/14 1036 05/12/14 1100 05/12/14 1115  BP:  100/67 101/66  Pulse:  105 104  Resp:  24 23  SpO2: 97% 97% 100%    Wt Readings from Last 3 Encounters:  04/25/14 51.71 kg (114 lb)  03/28/14 50.712 kg (111 lb 12.8 oz)  10/26/13 50.349 kg (111 lb)    General:  Appears calm and comfortable Eyes: PERRL, normal lids, irises & conjunctiva Neck:  no LAD, masses or thyromegaly Cardiovascular: RRR, no m/r/g. No LE edema.  Respiratory: CTA bilaterally, no w/r/r. Normal respiratory effort. Abdomen: soft, ntnd Skin: no rash or induration seen on limited exam Musculoskeletal: grossly normal tone BUE/BLE Neurologic: post ictal, able to follow simple commands. Speech normal.          Labs on Admission:  Basic Metabolic Panel:  Recent Labs Lab 05/12/14 1115  NA 139  K 4.0  CL 107  CO2 16*  GLUCOSE 109*  BUN 12  CREATININE 0.88  CALCIUM 8.4   Liver Function Tests:  Recent Labs Lab 05/12/14 1115  AST 50*  ALT 46  ALKPHOS 97  BILITOT <0.2*  PROT 7.0  ALBUMIN 3.2*   No results found for this basename: LIPASE, AMYLASE,  in the last 168 hours No results found for this basename: AMMONIA,  in the last 168 hours CBC:  Recent Labs Lab 05/12/14 1115  WBC 5.5  HGB 12.0*  HCT 37.0*  MCV 87.5  PLT 234   Cardiac Enzymes: No results found for this basename: CKTOTAL, CKMB, CKMBINDEX, TROPONINI,  in the last 168 hours  BNP (last 3 results) No results found for this basename: PROBNP,  in the last 8760 hours CBG: No results found for this basename: GLUCAP,  in the last 168 hours  Radiological Exams on Admission: Dg Chest Portable 1 View  05/12/2014   CLINICAL DATA:  Seizure.  Shortness of breath.  EXAM: PORTABLE CHEST - 1 VIEW  COMPARISON:  PA and lateral chest 04/25/2014 and 08/02/2013.  FINDINGS: [name] and stimulator device remain in place. Lungs are clear. Heart size is normal. No pneumothorax or pleural effusion. Bones are osteopenic.  IMPRESSION: No acute disease.  Stable compared to prior exam.   Electronically Signed   By: Inge Rise M.D.   On: 05/12/2014 11:48    EKG: sinus tachycardia at 105/min  Assessment/Plan Active Problems:   Status epilepticus   Seizure   Status epilepticus/ breakthrough seizures: Admit to step down. Probably from non compliant to medications.  Resume tegretol, keppra and  vimpat , topamax.  IV ativan as needed for seizures Neuro checks.  Seizure precautions.  Neurology consulted and recommendations given.    DVT prophylaxis.    Code Status: full code DVT Prophylaxis:SCD'S Family Communication:father at bedside. Disposition Plan: admit to step down  Time spent: 65 min  [facility] Pager [phone]  **Disclaimer: This note may have been dictated with voice recognition software. Similar sounding words can inadvertently be transcribed and this note may contain transcription errors which may not have been corrected upon publication of note.**

## 2014-05-12 NOTE — Telephone Encounter (Signed)
I spoke with the patient's mother.  [name] has had 2 more seizures and will be admitted.  Questions have been raised about whether or not to increase Vimpat.  That may be the only medicine that we can increase.  I told her that I would be happy to speak with the neuro- hospitalists.

## 2014-05-13 DIAGNOSIS — F70 Mild intellectual disabilities: Secondary | ICD-10-CM

## 2014-05-13 LAB — BASIC METABOLIC PANEL
Anion gap: 12 (ref 5–15)
BUN: 10 mg/dL (ref 6–23)
CO2: 19 mEq/L (ref 19–32)
Calcium: 8 mg/dL — ABNORMAL LOW (ref 8.4–10.5)
Chloride: 109 mEq/L (ref 96–112)
Creatinine, Ser: 0.78 mg/dL (ref 0.50–1.35)
GLUCOSE: 87 mg/dL (ref 70–99)
POTASSIUM: 3.4 meq/L — AB (ref 3.7–5.3)
Sodium: 140 mEq/L (ref 137–147)

## 2014-05-13 LAB — CBC
HCT: 33 % — ABNORMAL LOW (ref 39.0–52.0)
Hemoglobin: 11.1 g/dL — ABNORMAL LOW (ref 13.0–17.0)
MCH: 29.1 pg (ref 26.0–34.0)
MCHC: 33.6 g/dL (ref 30.0–36.0)
MCV: 86.6 fL (ref 78.0–100.0)
PLATELETS: 215 10*3/uL (ref 150–400)
RBC: 3.81 MIL/uL — AB (ref 4.22–5.81)
RDW: 13.5 % (ref 11.5–15.5)
WBC: 3.8 10*3/uL — ABNORMAL LOW (ref 4.0–10.5)

## 2014-05-13 LAB — TSH: TSH: 0.189 u[IU]/mL — AB (ref 0.350–4.500)

## 2014-05-13 LAB — T4, FREE: Free T4: 0.62 ng/dL — ABNORMAL LOW (ref 0.80–1.80)

## 2014-05-13 MED ORDER — LACOSAMIDE 50 MG PO TABS: 100.0000 mg | ORAL_TABLET | Freq: Two times a day (BID) | ORAL | Status: DC

## 2014-05-13 MED ORDER — SODIUM CHLORIDE 0.9 % IV BOLUS (SEPSIS)
1000.0000 mL | Freq: Once | INTRAVENOUS | Status: AC
Start: 2014-05-13 — End: 2014-05-13
  Administered 2014-05-13: 1000 mL via INTRAVENOUS

## 2014-05-13 MED ORDER — POTASSIUM CHLORIDE CRYS ER 20 MEQ PO TBCR: 40.0000 meq | EXTENDED_RELEASE_TABLET | Freq: Once | ORAL | Status: DC

## 2014-05-13 MED ORDER — LACOSAMIDE 50 MG PO TABS: 100.0000 mg | ORAL_TABLET | Freq: Three times a day (TID) | ORAL | Status: DC

## 2014-05-13 NOTE — Progress Notes (Signed)
Discharge instructions discussed with pt and mother. Prescriptions given. Verbalize understand and pt's mother has no further questions. All personal belongings with patient, meds retrieved from pharmacy and returned to pt's mother, Iv removed and pt disconnected from monitor. Transported pt via wheelchair to front entrance.

## 2014-05-13 NOTE — Progress Notes (Signed)
SLP Cancellation Note  Patient Details Name: [name] MRN: [id number] DOB: [birth year]   Cancelled treatment:         Per RN, pt has returned to baseline, tolerating po intake,  BSE is therefore no longer necessary. RN to cancel order. Please reconsult if needs arise.  Celia B. Quentin Ore [facility], CCC-SLP [phone]  Shonna Chock 05/13/2014, 1:21 PM

## 2014-05-13 NOTE — Progress Notes (Signed)
Subjective: No recurrent seizure activity since about 6:30 PM yesterday. Patient had no complaints this morning. No adverse overnight events occurred in the step down unit overnight.  Objective: Current vital signs: BP 104/57  Pulse 77  Temp(Src) 98 F (36.7 C) (Oral)  Resp 19  Ht 5\' 3"  (1.6 m)  Wt 52.164 kg (115 lb)  BMI 20.38 kg/m2  SpO2 100%  Neurologic Exam: Patient was alert and in no acute distress. Mental status was at baseline. Extraocular movements were full and conjugate. No focal facial weakness was noted. Speech was slurred back to baseline. Patient moved extremities equally.  Medications: I have reviewed the patient's current medications.  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 Vimpat was increased from 50 mg twice a day and 100 mg at bedtime and 100 mg twice a day and at bedtime. Acute seizure activity was managed with Ativan, as well as IV phenobarbital 65 mg. He appears to be back to baseline and doing well at this point.  I recommend no changes in current management. If he is doing well throughout the day no objection to patient being discharged home this evening, and resuming outpatient followup with Dr. Gaynell Face.   C.R. Nicole Kindred, MD [facility] [phone]  05/13/2014  10:07 AM

## 2014-05-13 NOTE — Progress Notes (Signed)
BP 89/51, HR 75. Dr. Sherral Hammers notified. Bolus ordered. Will continue to monitor.   Lum Babe, RN

## 2014-05-13 NOTE — Discharge Summary (Signed)
Physician Discharge Summary  [name] CHE:[id number] DOB: 08-Sep-1986 DOA: 05/12/2014  PCP: Marcus Noon, MD  Admit date: 05/12/2014 Discharge date: 05/13/2014  Time spent: 25 minutes  Recommendations for Outpatient Follow-up:  1. Follow up with Dr Gaynell Face on monday  Discharge Diagnoses:  Active Problems:   Status epilepticus   Seizure   Discharge Condition: improved.   Diet recommendation: regular  Filed Weights   05/12/14 1827  Weight: 52.164 kg (115 lb)    History of present illness:  [name] is a 28 y.o. male with h/o seizures, follows up with Dr Gaynell Face as outpatient, was brought in by EMS for active seizures. As per the father at bedside, he missed medications last night and this morning and he had breakthrough seizures at home and had a brief episode of apnea, duing which mother did cpr on her. By the time EMS got there he was breathing spontaneously and was brought to ED. He had another 2 episodes of witnessed seizures , grand mal. He reports mld headache, . Nausea improved after zofran. No vomiting. He is alert and appears to be at baseline. He is following simple commands. His seizures in ED lasted less than 2 minutes where he pulls his arms close to his chest and extends his legs. No tongue biting. Neurology consulted and recommendations given. He is referred to medical service for admission   Hospital Course:  Breakthrough seizures: admitted to stepdown.  Probably from non compliant to medications.  Resume tegretol, keppra and vimpat , topamax. Neurology consulted and recommendations given.  He was seizure free today and he will be discharged to follow up with neurology on Monday.    Procedures:  none  Consultations:  neurology  Discharge Exam: Filed Vitals:   05/13/14 1300  BP:   Pulse:   Temp: 98.2 F (36.8 C)  Resp:     General: alert afebrile comfortable Cardiovascular: S1S2 Respiratory: chest clear to  auscultation, no wheezing or rhonchi.   Discharge Instructions You were cared for by a hospitalist during your hospital stay. If you have any questions about your discharge medications or the care you received while you were in the hospital after you are discharged, you can call the unit and asked to speak with the hospitalist on call if the hospitalist that took care of you is not available. Once you are discharged, your primary care physician will handle any further medical issues. Please note that NO REFILLS for any discharge medications will be authorized once you are discharged, as it is imperative that you return to your primary care physician (or establish a relationship with a primary care physician if you do not have one) for your aftercare needs so that they can reassess your need for medications and monitor your lab values.  Discharge Instructions   Discharge instructions    Complete by:  As directed   FOLLOW UP WITH Dr Gaynell Face as recommended in one week.            Medication List         acetaminophen 500 MG tablet  Commonly known as:  TYLENOL  Take 500 mg by mouth 2 (two) times daily as needed for moderate pain (pain).     albuterol (2.5 MG/3ML) 0.083% nebulizer solution  Commonly known as:  PROVENTIL  Take 2.5 mg by nebulization 2 (two) times daily as needed for wheezing or shortness of breath.     carbamazepine 100 MG chewable tablet  Commonly known as:  TEGRETOL  Chew 250 mg by mouth 3 (three) times daily.     cetirizine 10 MG tablet  Commonly known as:  ZYRTEC  Take 10 mg by mouth daily.     diazepam 2 MG tablet  Commonly known as:  VALIUM  Take 2 mg by mouth every 6 (six) hours as needed (for seizures).     lacosamide 50 MG Tabs tablet  Commonly known as:  VIMPAT  Take 2 tablets (100 mg total) by mouth 3 (three) times daily. Take 1 tablet (50 mg) daily at 8:30 am and 1:30pm, take 2 tablets (100 mg) daily at bedtime: 9:30pm     levETIRAcetam 500 MG tablet   Commonly known as:  KEPPRA  Take 1,500 mg by mouth 2 (two) times daily.     levothyroxine 50 MCG tablet  Commonly known as:  SYNTHROID, LEVOTHROID  Take 50 mcg by mouth daily.     LORazepam 2 MG/ML concentrated solution  Commonly known as:  ATIVAN  Withdraw 61ml (2mg ) and 62ml  Normal saline into 3 ml syringe. Administer IV push over 3-5 minutes as needed for recurrent seizures     multivitamin with minerals Tabs tablet  Take 1 tablet by mouth daily.     ondansetron 8 MG disintegrating tablet  Commonly known as:  ZOFRAN ODT  Take 1 tablet (8 mg total) by mouth every 8 (eight) hours as needed for nausea or vomiting.     [name] 10 MG tablet  Generic drug:  cloBAZam  Take 5 mg by mouth 3 (three) times daily.     PHENObarbital 65 MG/ML injection  Commonly known as:  LUMINAL  Withdraw 18ml (65mg ) Phenobarbital and 48ml Normal Saline into 51ml syringe. Adminster IV push for 3-5 minutes as needed for recurrent seizures     potassium chloride SA 20 MEQ tablet  Commonly known as:  K-DUR,KLOR-CON  Take 20 mEq by mouth 4 (four) times daily.     potassium phosphate (monobasic) 500 MG tablet  Commonly known as:  K-PHOS ORIGINAL  Take 1 tablet (500 mg total) by mouth 4 (four) times daily.     TROKENDI XR 200 MG Cp24  Generic drug:  Topiramate ER  Take 200 mg by mouth at bedtime.     TROKENDI XR 50 MG Cp24  Generic drug:  Topiramate ER  Take 100 mg by mouth at bedtime.       Allergies  Allergen Reactions  . Antihistamines, Chlorpheniramine-Type Other (See Comments)    Other Reaction: Dimetapp causes seizures  . Divalproex Sodium Other (See Comments)    Nausea, vomiting, liver and kidney dysfunction  . Phenytoin Rash and Other (See Comments)    Dilantin causes more seizures.   . Valproic Acid And Related Other (See Comments)    Shuts down systems  . Vancomycin Rash and Other (See Comments)    Other Reaction: Redman's Syndrome       Follow-up Information   Follow up with  BADGER,MICHAEL C, MD. Schedule an appointment as soon as possible for a visit in 1 week.   Specialty:  Family Medicine   Contact information:   [place] [address] [phone]        The results of significant diagnostics from this hospitalization (including imaging, microbiology, ancillary and laboratory) are listed below for reference.    Significant Diagnostic Studies: Dg Chest 2 View  04/25/2014   CLINICAL DATA:  Seizure, persisting cough.  EXAM: CHEST  2 VIEW  COMPARISON:  Chest  radiograph August 02, 2013  FINDINGS: Patient is rotated to the right. The cardiac silhouette appears upper limits of normal in size, similar. Mediastinal silhouette is nonsuspicious. Hazy density in left lung, slightly increased. Similar biapical pleural thickening. Vagal stimulator left chest. Single lumen to [name] right chest with distal tip at cavoatrial junction. No pneumothorax.  Patient is mildly osteopenic.  IMPRESSION: Hazy density in left lung, which could be projectional as patient is rotated to the right. No focal consolidation.  Borderline cardiomegaly.   Electronically Signed   By: Elon Alas   On: 04/25/2014 18:17   Dg Chest Portable 1 View  05/12/2014   CLINICAL DATA:  Seizure.  Shortness of breath.  EXAM: PORTABLE CHEST - 1 VIEW  COMPARISON:  PA and lateral chest 04/25/2014 and 08/02/2013.  FINDINGS: [name] and stimulator device remain in place. Lungs are clear. Heart size is normal. No pneumothorax or pleural effusion. Bones are osteopenic.  IMPRESSION: No acute disease.  Stable compared to prior exam.   Electronically Signed   By: Inge Rise M.D.   On: 05/12/2014 11:48    Microbiology: Recent Results (from the past 240 hour(s))  MRSA PCR SCREENING     Status: None   Collection Time    05/12/14 10:09 PM      Result Value Ref Range Status   MRSA by PCR NEGATIVE  NEGATIVE Final   Comment:            The GeneXpert MRSA Assay (FDA     approved  for NASAL specimens     only), is one component of a     comprehensive MRSA colonization     surveillance program. It is not     intended to diagnose MRSA     infection nor to guide or     monitor treatment for     MRSA infections.     Labs: Basic Metabolic Panel:  Recent Labs Lab 05/12/14 1115 05/13/14 0253  NA 139 140  K 4.0 3.4*  CL 107 109  CO2 16* 19  GLUCOSE 109* 87  BUN 12 10  CREATININE 0.88 0.78  CALCIUM 8.4 8.0*   Liver Function Tests:  Recent Labs Lab 05/12/14 1115  AST 50*  ALT 46  ALKPHOS 97  BILITOT <0.2*  PROT 7.0  ALBUMIN 3.2*   No results found for this basename: LIPASE, AMYLASE,  in the last 168 hours No results found for this basename: AMMONIA,  in the last 168 hours CBC:  Recent Labs Lab 05/12/14 1115 05/13/14 0253  WBC 5.5 3.8*  HGB 12.0* 11.1*  HCT 37.0* 33.0*  MCV 87.5 86.6  PLT 234 215   Cardiac Enzymes: No results found for this basename: CKTOTAL, CKMB, CKMBINDEX, TROPONINI,  in the last 168 hours BNP: BNP (last 3 results) No results found for this basename: PROBNP,  in the last 8760 hours CBG: No results found for this basename: GLUCAP,  in the last 168 hours     Signed:  Kaylib Furness  [facility] 05/13/2014, 3:04 PM

## 2014-05-16 ENCOUNTER — Ambulatory Visit (INDEPENDENT_AMBULATORY_CARE_PROVIDER_SITE_OTHER): Payer: Medicaid Other | Admitting: Pediatrics

## 2014-05-16 ENCOUNTER — Telehealth: Payer: Self-pay | Admitting: Cardiovascular Disease

## 2014-05-16 ENCOUNTER — Encounter: Payer: Self-pay | Admitting: Pediatrics

## 2014-05-16 VITALS — BP 90/70 | HR 108 | Ht 62.5 in | Wt 108.8 lb

## 2014-05-16 DIAGNOSIS — G40319 Generalized idiopathic epilepsy and epileptic syndromes, intractable, without status epilepticus: Secondary | ICD-10-CM

## 2014-05-16 DIAGNOSIS — E876 Hypokalemia: Secondary | ICD-10-CM

## 2014-05-16 DIAGNOSIS — G40119 Localization-related (focal) (partial) symptomatic epilepsy and epileptic syndromes with simple partial seizures, intractable, without status epilepticus: Secondary | ICD-10-CM

## 2014-05-16 DIAGNOSIS — I469 Cardiac arrest, cause unspecified: Secondary | ICD-10-CM

## 2014-05-16 DIAGNOSIS — R269 Unspecified abnormalities of gait and mobility: Secondary | ICD-10-CM

## 2014-05-16 NOTE — Progress Notes (Addendum)
Patient: [name] MRN: [id number] Sex: male DOB: 14-Oct-1985  Provider: Jodi Geralds, MD Location of Care: [facility]  Note type: Urgent return visit  History of Present Illness: Referral Source: Dr. Irena Reichmann History from: mother and sibling, patient and [facility] chart Chief Complaint: Hospital follow up-Seizures  Maclovio Henson Scarbrough Perez is a 28 y.o. who returns for evaluation and management of Intractable seizures and cardiopulmonary arrest associated with them.  [name] returns May 16, 2014, for the first time since March 28, 2014.  In the interim, he has been admitted on two occasions to [facility] April 25, 2014, and April 26, 2014, and May 12, 2014, and May 13, 2014.  He was here today with his mother and sister.  He had a seizure after vagal nerve stimulator battery replacement (July 29) on April 25, 2014.  After he experienced the seizure, he told his mother he thought he would have more.  She decided to access his port and left the room to get some other materials.  She left him in the bedroom.  She found him shortly thereafter on the floor in the shower of his bathroom.  He had a seizure and struck the floor.  He was unconscious, apneic, and cyanotic.  His mother called EMS and initiated external massage.  After a short time, he began to gasp and he had shallow erratic breathing.  He was unresponsive for 20 to 25 minutes.  He had two more seizures in the emergency room at [facility].  Interestingly, it appeared that the vagal nerve stimulator stopped both of them.  He was discharged home without change in his antiepileptic medication with plans to follow up in my office.  On May 12, 2014, in the early morning, he hit his head on the headboard of his bed.  His mother came in after she heard the sound to find him in the middle of generalized tonic-clonic seizure with apnea and cyanosis.  He had partially fallen off the bed.  She  got him onto the floor and believed that he was apneic and cyanotic for about 4 minutes.  She performed 15 external compressions and he was still not breathing.  She contacted EMS and initiated external compressions again and he began to breathe.  I asked her if she felt that the apnea took place in the midst of a seizure or after the seizure was over.  After much thought, she felt that he became apneic once convulsive activity stopped; however, he was breathing poorly during the seizure, which in part led to cyanosis.  He had four more seizures in the emergency room and that were convulsive and a complex partial seizure after admission.  At one point, he vomited blood.  His Vimpat was increased from 50 mg in the morning and afternoon and 100 mg at nighttime to 100 mg three times a day.  Unfortunately, because he is on so much antiepileptic medicine, he has not been able to tolerate this and his gait has become very unsteady.  We have seen this before with higher doses of antiepileptic medicines.  He now takes Tegretol, [name], Vimpat, levetiracetam, and topiramate ER.  Interestingly, he had no seizures between April 25, 2014 and May 12, 2014.  As he has become older, his seizures are less frequent, but they are more problematic both in terms of convulsive activity and apnea.  In addition to seizures, he has atrial fibrillation, mitral valve prolapse, hypothyroidism, hypokalemia, obstructive  sleep apnea, and moderate intellectual disabilities.  He has calcifications in his brain.  I interrogated his vagal nerve stimulator, which is described below .  Procedure: Interrogation and reprogramming of Vagal Nerve Stimulator.  Implantation of the current stimulator 104 serial # [id number] implanted 04/19/14  Interrogation of the vagal nerve stimulator: Voltage 2.25 mA. Frequency 20 Hz, pulse width 250 microseconds, signal time on 7 seconds signal time off 1.1 minutes, magnet current 2.50 mA, signal time on 60  seconds, pulse width 250 microseconds.   The normal mode diagnostics using his current settings revealed the battery is sound and communication, output, and impedance are good;  1879 ohms.  74 stimuli since implantation.   Review of Systems: 12 system review was remarkable for seizures, can not walk without assistance  Past Medical History  Diagnosis Date  . ALLERGIC RHINITIS 12/26/2009  . MITRAL VALVE PROLAPSE 06/22/2007  . SEIZURE DISORDER 06/22/2007  . Unspecified hypothyroidism 06/22/2007  . Growth disorder   . Mental retardation, mild (I.Q. 50-70)   . Brown's syndrome   . Heart murmur     moderate MR  . Sleep apnea     cpap , last sleep study 10/01/11  . Hypokalemia   . Hyperlipidemia     No therapy  . Blood clot in vein    Hospitalizations: Yes.  , Head Injury: No., Nervous System Infections: No., Immunizations up to date: Yes.   Past Medical History Diagnosis of developmental delay as a toddler.   He was a breech presentation, cesarean section delivery. He had intrauterine growth retardation, and failure to thrive.   EEG 11/07/86 was normal for gestational age.   Genetics evaluation short stature, prenatal onset, chromosomal study: 21 XY, fragile X syndrome negative, evaluation for MELAS and MERRF were negative.   Initial seizure 08/28/94 left locomotor with secondary generalization.   MRI of the brain 09/13/93: Scattered subcortical white matter lesions at the parietotemporal parieto-occipital junction is ischemic versus hamartomas.   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   Diagnosis attention deficit disorder inattentive type treated without success with Cylert April 1995  MRI brain 04/04/94 stable differential diagnosis hamartoma, vasculitis, ischemic, or embolic phenomenon.   status epilepticus 01/31/95, and 04/15/95 Neurontin added to Tegretol   MRI brain 06/14/96 unchanged   admission to [facility]. Tegretol 30 mcg for militate. 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   Topiramate started in May 1999 unable to be tolerated with Tegretol and was tapered and discontinued.  January 2000 EEG showed right greater than left mid temporal sharp waves was otherwise well-organized EEG. He hospitalizations in March, July, October, in November for recurrent seizures.  Patient placed on Depakote in April 2000 and developed gagging abdominal pain headache and had significant change in transaminases and liver functions. Topamax was restarted and Keppra was added.   Vimpat was started July 16, 2012 and adjusted upwards.   [name] was added on November 18, 2012 and has been adjusted upwards. Topamax was changed to Trokendi XR in attempt to deal with sleepiness unsteadiness caused by polypharmacy. We to these medications have been introduced, the patient  experiences somewhat improved seizure control however Is quite likely that despite better seizure control, the patient is experiencing impairment in cognition and gait as a result of polypharmacy .  MRI of the brain on March 21, 2013. The patient has extensive calcification to the basal ganglia bilaterally, normal ventricle size, and no acute findings. EKG was unchanged.  Behavior History none  Surgical History Past Surgical History  Procedure Laterality Date  . Implantation vagal nerve stimulator      Left  . Portacath placement      and removal 04/08/12  . Eye surgery      X2 for Brown Syndrome    Family History family history includes Asthma in his father; Breast cancer in his  mother; Congestive Heart Failure in his maternal grandfather; Coronary artery disease in his maternal grandfather and paternal grandfather; Diabetes in his father; Lung cancer in his paternal grandfather; Pneumonia in his maternal grandfather and paternal grandmother; Stroke in his paternal grandmother. Family history is negative for migraines, seizures, intellectual disabilities, blindness, deafness, birth defects, chromosomal disorder, or autism.  Social History History   Social History  . Marital Status: Single    Spouse Name: N/A    Number of Children: 0  . Years of Education: N/A   Occupational History  . Disabled    Social History Main Topics  . Smoking status: Never Smoker   . Smokeless tobacco: Never Used  . Alcohol Use: No  . Drug Use: No  . Sexual Activity: No   Other Topics Concern  . None   Social History Narrative  . None   Educational level 12th grade School Attending: special education   Occupation:  Living with both parents  Hobbies/Interest: loves watching NASCAR and football School comments Ramiz received a certificate of completion at [employer] in 2010.  Allergies  Allergen Reactions  . Antihistamines, Chlorpheniramine-Type Other (See Comments)    Other Reaction: Dimetapp causes seizures  . Divalproex Sodium Other (See Comments)    Nausea, vomiting, liver and kidney dysfunction  . Phenytoin Rash and Other (See Comments)    Dilantin causes more seizures.   . Valproic Acid And Related Other (See Comments)    Shuts down systems  . Vancomycin Rash and Other (See Comments)    Other Reaction: Redman's Syndrome    Physical Exam BP 90/70  Pulse 108  Ht 5' 2.5" (1.588 m)  Wt 108 lb 12.8 oz (49.351 kg)  BMI 19.57 kg/m2  General: thin young man, seated on the examination table, in no distress, right-handed, sandy hair, blue/brown eyes; Awake, alert, animated, talkative.  Head: head microcephalic and atraumatic, His head is slightly turned with  the right ear toward his right shoulder and chin points the left. He does not have torticollis. Left face and neck are swollen.  Ears, Nose and Throat: beak-like nose. He has a low lying palate. Wax occluding external auditory canals  Neck: supple with no carotid or supraclavicular bruits.  Respiratory: Lungs are clear to auscultation  Cardiovascular: regular rate and rhythm, soft holosystolic murmur at left sternal border. His nailbeds are pink.  Musculoskeletal: short stature with head tilted to the right. He has tight heel cords.  Skin: no rashes or lesions.  Trunk: no scoliosis. His portacath in the right upper chest. He has a vagal nerve stimulator in the left upper chest.   Neurologic Exam   Mental Status: Awake, alert. Oriented to place and time. Attention span, concentration, and fund of knowledge appropriate for him.  He has evidence of mild intellectual disability. Mood and affect appropriate. 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 Pupils equal, briskly reactive to light. Extraocular movements full but dysconjugate. Visual fields full to confrontation. Hearing intact to air conduction greater than bone conduction. Facial sensation intact. Face, tongue, palate move normally and symmetrically. Neck flexion and extension normal. 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 He had a slight tic-like movement of his mouth.  Motor: Diminished bulk and normal tone. The patient has diffuse mild weakness in his proximal and distal muscles in the 4-4+ over 5 range.He had normal strength in his legs. He has clumsy fine motor movements. His hands are semiflexed at the distal joints at rest but he can extend them.  Sensory: Intact to light touch,cold, vibration, and proprioception in all extremities; good stereognosis  Coordination: Rapid alternating movements are clumsy in all  extremities. Finger-to-nose and heel-to-shin performed accurately bilaterally. Romberg negative.  Gait and Station: Right foot is in a somewhat valgus position, but the heel strike for both feet is good. His gait is slightly broad-based and was less steady in more ataxic today than on his last visit.  Reflexes: Absent and symmetric. Toes downgoing.  Assessment 1. 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 2. 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 3. Cardiopulmonary arrest, 427.5. 4. Abnormality of gait, 781.2.  Discussion I cannot determine whether the patient had apnea because of his seizures involving the diaphragm, or whether he had apnea and cyanosis related to a cardiac arrhythmia.  It appears that the apnea began after his seizures ceased.  He was cyanotic; however, during the seizure.  In both occasions, he responded well to external massage, it is clear; however, that his seizures are life-threatening.  I am not certain how else to treat him.  I am reluctant to add a sixth antiepileptic medication.  I think that one needs to be withdrawn and then we can add another.  I would be interested in treating him with Fycompa and I think the topiramate is least likely to be providing benefit to him.  Plan I have asked him to slowly discontinue topiramate by 50 mg every other week.  We will start him Fycompa 4 mg and gradually titrate him up to his tolerance, which may be as little as 8 mg or as much as 12 mg.  Higher doses seem to do better in terms of the percent of the patients brought under control, but somnolence seems to be dose-dependent.  I recommended to the family contact Dr. Lauree Chandler, and see if we can arrange for an event recorder be placed so that we can determine if he is really having apnea, or ventricular fibrillation or some other arrhythmia caused by his seizures.  He apparently has an  appointment with cardiology in late September.  I would like to see this take place in her if possible.  I will refill a number of his medications.  He will return in three months, sooner depending upon clinical need.  I spent 40-minutes of face-to-face time with [name] and interrogated his vagal nerve stimulator.  I did not change the settings except to interrogate the device.  It is working well and in fact seems to be aborting some of his seizures.    Medication List       This list is accurate as of:  05/16/14 11:59 PM.  Always use your most recent med list.               carbamazepine 100 MG chewable tablet  Commonly known as:  TEGRETOL  Chew 250 mg by mouth 3 (three) times daily.     cetirizine 10 MG tablet  Commonly known as:  ZYRTEC  Take 10 mg by mouth daily.     diazepam 2 MG tablet  Commonly known as:  VALIUM  Take 2 mg by mouth every 6 (six) hours as needed (for seizures).     KEPPRA 500 MG tablet  Generic drug:  levETIRAcetam  Take 3 tablets twice daily     levothyroxine 50 MCG tablet  Commonly known as:  SYNTHROID, LEVOTHROID  Take 50 mcg by mouth daily.     LORazepam 2 MG/ML concentrated solution  Commonly known as:  ATIVAN  Withdraw 4ml (2mg ) and 76ml  Normal saline into 3 ml syringe. Administer IV push over 3-5 minutes as needed for recurrent seizures     multivitamin with minerals Tabs tablet  Take 1 tablet by mouth daily.     [name] 10 MG tablet  Generic drug:  cloBAZam  Take 5 mg by mouth 3 (three) times daily.     PHENObarbital 65 MG/ML injection  Commonly known as:  LUMINAL  Withdraw 23ml (65mg ) Phenobarbital and 47ml Normal Saline into 54ml syringe. Adminster IV push for 3-5 minutes as needed for recurrent seizures     potassium chloride SA 20 MEQ tablet  Commonly known as:  KLOR-CON M20  Take one tablet 4 times daily     potassium phosphate (monobasic) 500 MG tablet  Commonly known as:  K-PHOS ORIGINAL  Take 1 tablet (500 mg total) by mouth 4  (four) times daily.     TROKENDI XR 200 MG Cp24  Generic drug:  Topiramate ER  Take 200 mg by mouth at bedtime.     TROKENDI XR 50 MG Cp24  Generic drug:  Topiramate ER  Take one tablet by mouth at bedtime for 2 weeks, then take 3 tablets by mouth at bedtime for 2 weeks, then take 2 tablets by mouth at bedtime for 2 weeks, then take one tablet by mouth at bedtime for 2 weeks, then discontinue.     VIMPAT 50 MG Tabs tablet  Generic drug:  lacosamide  Take 1 tablet in the morning, one tablet in the mid day, and 2 tablets at bedtime       The medication list was reviewed and reconciled. All changes or newly prescribed medications were explained.  A complete medication list was provided to the patient/caregiver.  Jodi Geralds MD

## 2014-05-16 NOTE — Telephone Encounter (Signed)
Left message to call back. Tried home phone but no answer. Dr. Angelena Form can see pt on August 27,2015 at 11:30

## 2014-05-16 NOTE — Patient Instructions (Signed)
Topiramate is going to be slowly tapered by 50 mg every other week.  Currently he takes one 200 mg and two - 50 mg tablets.  I want to change that to one 200 mg and one 50 mg every night.  In 2 weeks we will drop to one 200 mg.  After that we will switch to 50 mg tablets and he will take 3 tablets at nighttime for 2 weeks, then 2 tablets at nighttime for 2 weeks, then one tablet at nighttime for 2 weeks; then discontinue. I will write a prescription.  Medicine I'm interested in starting is called Fycompa.  I would look this up.  Vagal nerve stimulator is working very well and seems as if his aborting his seizures.  Under concerned about the possibility of cardiac arrhythmia related to seizures and want an event recorder placed on him.  Please contact Dr. Billie Ruddy office.  I know that he hasn't limit later in September I would like to see that moved out if at all possible.

## 2014-05-16 NOTE — Telephone Encounter (Signed)
New message      Patient's mother said Dr Gaynell Face want pt seen sooner than scheduled appt.  Pt is having seizures and is sometimes requiring CPR.  Dr Gaynell Face thinks it may be heart releated.  Please call mom

## 2014-05-17 MED ORDER — POTASSIUM CHLORIDE CRYS ER 20 MEQ PO TBCR: EXTENDED_RELEASE_TABLET | ORAL | Status: DC

## 2014-05-17 MED ORDER — TROKENDI XR 50 MG PO CP24: ORAL_CAPSULE | ORAL | Status: DC

## 2014-05-17 MED ORDER — VIMPAT 50 MG PO TABS: ORAL_TABLET | ORAL | Status: DC

## 2014-05-17 MED ORDER — POTASSIUM PHOSPHATE MONOBASIC 500 MG PO TABS: 500.0000 mg | ORAL_TABLET | Freq: Four times a day (QID) | ORAL | Status: DC

## 2014-05-17 MED ORDER — KEPPRA 500 MG PO TABS
ORAL_TABLET | ORAL | Status: DC
Start: 2014-05-17 — End: 2015-05-25

## 2014-05-17 NOTE — Addendum Note (Signed)
Addended by: Jodi Geralds on: 05/17/2014 07:38 AM   Modules accepted: Orders, Medications

## 2014-05-17 NOTE — Telephone Encounter (Signed)
Follow up     Returning a call from yesterday

## 2014-05-17 NOTE — Telephone Encounter (Signed)
Pt's mother return a call. She is aware that pt can be seen tomorrow 05/18/14 by Dr Angelena Form at 11:30 AM. Pt's mother verbalized understanding.

## 2014-05-18 ENCOUNTER — Encounter (INDEPENDENT_AMBULATORY_CARE_PROVIDER_SITE_OTHER): Payer: Medicaid Other

## 2014-05-18 ENCOUNTER — Encounter: Payer: Self-pay | Admitting: Cardiovascular Disease

## 2014-05-18 ENCOUNTER — Encounter: Payer: Self-pay | Admitting: *Deleted

## 2014-05-18 ENCOUNTER — Ambulatory Visit (INDEPENDENT_AMBULATORY_CARE_PROVIDER_SITE_OTHER): Payer: Medicaid Other | Admitting: Cardiovascular Disease

## 2014-05-18 VITALS — BP 100/60 | HR 60 | Ht 62.5 in | Wt 109.0 lb

## 2014-05-18 DIAGNOSIS — I059 Rheumatic mitral valve disease, unspecified: Secondary | ICD-10-CM

## 2014-05-18 DIAGNOSIS — R55 Syncope and collapse: Secondary | ICD-10-CM

## 2014-05-18 DIAGNOSIS — I4891 Unspecified atrial fibrillation: Secondary | ICD-10-CM

## 2014-05-18 DIAGNOSIS — I340 Nonrheumatic mitral (valve) insufficiency: Secondary | ICD-10-CM

## 2014-05-18 DIAGNOSIS — I480 Paroxysmal atrial fibrillation: Secondary | ICD-10-CM

## 2014-05-18 LAB — LEVETIRACETAM LEVEL: Levetiracetam Lvl: 23.2 ug/mL

## 2014-05-18 NOTE — Patient Instructions (Signed)
Keep scheduled appointment with Dr. Angelena Form on September 29,2015  Your physician has recommended that you wear an event monitor. Event monitors are medical devices that record the heart's electrical activity. Doctors most often [place] these monitors to diagnose arrhythmias. Arrhythmias are problems with the speed or rhythm of the heartbeat. The monitor is a small, portable device. You can wear one while you do your normal daily activities. This is usually used to diagnose what is causing palpitations/syncope (passing out).

## 2014-05-18 NOTE — Progress Notes (Signed)
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 He was initially seen as a new consult for his atrial fibrillation by Dr. Jolyn Nap on 04/08/12. He was admitted on 04/08/12 to get a [name] replacement. They removed the old [name] but were not able to insert a new one because of right-sided central venous occlusion. During wire manipulatoin, he developed rapid atrial fibrillation.  In PACU, the atrial fibrillation resolved without medical intervention. At the time of evaluation, he was maintaining sinus rhythm and had no recurrence during the hospital stay. He has frequent seizures. Last echo 05/20/13 with normal LV function, moderate mitral regurgitation.   He is here today for follow up after seeing his Neurologist this week. He has been having frequent seizures and requiring CPR during these episodes per report. He has been admitted to [facility] twice over the last 3 weeks following seizures during which he becomes apneic, non-responsive and requires CPR by family. The question is if there are any cardiac arrythmias associated with these events.  He feels well. No chest pain or SOB. No awareness of palpitations.   Primary Care Physician: Anastasia Pall  Past Medical History  Diagnosis Date  . ALLERGIC RHINITIS 12/26/2009  . MITRAL VALVE PROLAPSE 06/22/2007  . SEIZURE DISORDER 06/22/2007  . Unspecified hypothyroidism 06/22/2007  . Growth disorder   . Mental retardation, mild (I.Q. 50-70)   . Brown's syndrome   . Heart murmur     moderate MR  . Sleep apnea     cpap , last sleep study 10/01/11  . Hypokalemia   . Hyperlipidemia     No therapy  . Blood clot in vein     Past Surgical History  Procedure Laterality Date  . Implantation vagal nerve stimulator     Left  . Portacath placement      and removal 04/08/12  . Eye surgery      X2 for [facility] Syndrome    Current Outpatient Prescriptions  Medication Sig Dispense Refill  . carbamazepine (TEGRETOL) 100 MG chewable tablet Chew 250 mg by mouth 3 (three) times daily.      . cetirizine (ZYRTEC) 10 MG tablet Take 10 mg by mouth daily.      . cloBAZam ([name]) 10 MG tablet Take 5 mg by mouth 3 (three) times daily.      . diazepam (VALIUM) 2 MG tablet Take 2 mg by mouth every 6 (six) hours as needed (for seizures).      Marland Kitchen KEPPRA 500 MG tablet Take 3 tablets twice daily  186 tablet  5  . levothyroxine (SYNTHROID, LEVOTHROID) 25 MCG tablet Take 25 mcg by mouth daily before breakfast.      . LORazepam (ATIVAN) 2 MG/ML concentrated solution Withdraw 7ml (2mg ) and 73ml  Normal saline into 3 ml syringe. Administer IV push over 3-5 minutes as needed for recurrent seizures  5 mL  5  . Multiple Vitamin (MULTIVITAMIN WITH MINERALS) TABS Take 1 tablet by mouth daily.      Marland Kitchen PHENObarbital (LUMINAL) 65 MG/ML injection Withdraw 66ml (65mg ) Phenobarbital and 41ml Normal Saline into 86ml syringe. Adminster IV push for 3-5 minutes as needed for recurrent seizures  5 mL  5  . potassium chloride SA (KLOR-CON M20) 20 MEQ tablet Take one tablet 4 times daily  124 tablet  5  .  potassium phosphate, monobasic, (K-PHOS ORIGINAL) 500 MG tablet Take 1 tablet (500 mg total) by mouth 4 (four) times daily.  124 tablet  5  . Topiramate ER (TROKENDI XR) 200 MG CP24 Take 200 mg by mouth at bedtime.      Marland Kitchen TROKENDI XR 50 MG CP24 Take one tablet by mouth at bedtime for 2 weeks, then take 3 tablets by mouth at bedtime for 2 weeks, then take 2 tablets by mouth at bedtime for 2 weeks, then take one tablet by mouth at bedtime for 2 weeks, then discontinue.  98 capsule  0  . VIMPAT 50 MG TABS tablet Take 1 tablet in the morning, one tablet in the mid day, and 2 tablets at bedtime  124 tablet  5   No current facility-administered medications for this  visit.    Allergies  Allergen Reactions  . Antihistamines, Chlorpheniramine-Type Other (See Comments)    Other Reaction: Dimetapp causes seizures  . Divalproex Sodium Other (See Comments)    Nausea, vomiting, liver and kidney dysfunction  . Phenytoin Rash and Other (See Comments)    Dilantin causes more seizures.   . Valproic Acid And Related Other (See Comments)    Shuts down systems  . Vancomycin Rash and Other (See Comments)    Other Reaction: Redman's Syndrome    History   Social History  . Marital Status: Single    Spouse Name: N/A    Number of Children: 0  . Years of Education: N/A   Occupational History  . Disabled    Social History Main Topics  . Smoking status: Never Smoker   . Smokeless tobacco: Never Used  . Alcohol Use: No  . Drug Use: No  . Sexual Activity: No   Other Topics Concern  . Not on file   Social History Narrative  . No narrative on file    Family History  Problem Relation Age of Onset  . Breast cancer Mother   . Asthma Father   . Diabetes Father   . Coronary artery disease Maternal Grandfather   . Coronary artery disease Paternal Grandfather   . Lung cancer Paternal Grandfather     Died at 9  . Pneumonia Paternal Grandmother     Died at 21  . Stroke Paternal Grandmother   . Congestive Heart Failure Maternal Grandfather     Died at 17  . Pneumonia Maternal Grandfather     Review of Systems:  As stated in the HPI and otherwise negative.   BP 100/60  Pulse 60  Ht 5' 2.5" (1.588 m)  Wt 109 lb (49.442 kg)  BMI 19.61 kg/m2  Physical Examination: General: Well developed, well nourished, NAD HEENT: OP clear, mucus membranes moist SKIN: warm, dry. No rashes. Neuro: No focal deficits Musculoskeletal: Muscle strength 5/5 all ext Psychiatric: Mood and affect normal Neck: No JVD, no carotid bruits, no thyromegaly, no lymphadenopathy. Lungs:Clear bilaterally, no wheezes, rhonci, crackles Cardiovascular: Regular rate and rhythm.  Slight systolic  Murmur. No gallops or rubs. Abdomen:Soft. Bowel sounds present. Non-tender.  Extremities: No lower extremity edema. Pulses are 2 + in the bilateral DP/PT.  Echo 05/20/13: Left ventricle: The cavity size was mildly dilated. Wall thickness was normal. Systolic function was normal. The estimated ejection fraction was in the range of 60% to 65%. Wall motion was normal; there were no regional wall motion abnormalities. - Mitral valve: Moderate regurgitation.  EKG 05/13/14: Sinus tachycardia   Assessment and Plan:   1. Syncope/apnea following seizures: No  clear evidence of cardiac arrythmias. Telemetry in hospital without arrythmias. EKG with sinus tach. Will arrange 30 day event monitor. I suspect that this is all driven by his seizures. Unclear if he is pulseless or just apneic during these events.   2. Atrial fibrillation: One episode during a procedure in July 2013. Likely induced by the wire. No recurrence. He is in normal sinus rhythm today. No further workup or treatment.  He is on coumadin for upper extremity DVT.    3. Mitral regurgitation: Moderate MR by echo August 2014. Unchanged from echo in 2013.  Soft murmur on exam.

## 2014-05-18 NOTE — Progress Notes (Signed)
Patient ID: [name], male   DOB: 1986-05-31, 28 y.o.   MRN: [id number] Cardio verite 21 day cardiac event monitor applied to patient.

## 2014-06-12 ENCOUNTER — Telehealth: Payer: Self-pay | Admitting: *Deleted

## 2014-06-12 NOTE — Telephone Encounter (Signed)
Currently he is taking 200 mg of Trokendi.  I recommended increasing to 250 mg which was last dose where he did not have somatosensory seizures.

## 2014-06-12 NOTE — Telephone Encounter (Signed)
Doris, mom, stated that since the pt was taken off the trokendi, the pt has been having more of the sensory seizures. The mother said, tomorrow, the pt will be reduced to 150 mg of the rx. The dosage was originally 300 mg. The mother is concerned about the decrease. The mother would like to know if the pt should be off the keppra and put him back on trokendi. The mother can be reached at [phone].

## 2014-06-13 ENCOUNTER — Other Ambulatory Visit: Payer: Self-pay | Admitting: Family

## 2014-06-13 DIAGNOSIS — G40401 Other generalized epilepsy and epileptic syndromes, not intractable, with status epilepticus: Secondary | ICD-10-CM

## 2014-06-13 DIAGNOSIS — G40319 Generalized idiopathic epilepsy and epileptic syndromes, intractable, without status epilepticus: Secondary | ICD-10-CM

## 2014-06-13 MED ORDER — LORAZEPAM 2 MG/ML PO CONC: ORAL | Status: DC

## 2014-06-13 MED ORDER — PHENOBARBITAL SODIUM 65 MG/ML IJ SOLN: INTRAMUSCULAR | Status: DC

## 2014-06-13 NOTE — Telephone Encounter (Signed)
Rx's faxed to [facility] as requested. TG

## 2014-06-20 ENCOUNTER — Encounter: Payer: Self-pay | Admitting: Cardiovascular Disease

## 2014-06-20 ENCOUNTER — Ambulatory Visit (INDEPENDENT_AMBULATORY_CARE_PROVIDER_SITE_OTHER): Payer: Medicaid Other | Admitting: Cardiovascular Disease

## 2014-06-20 VITALS — BP 100/60 | HR 72 | Wt 111.0 lb

## 2014-06-20 DIAGNOSIS — I059 Rheumatic mitral valve disease, unspecified: Secondary | ICD-10-CM

## 2014-06-20 DIAGNOSIS — I4891 Unspecified atrial fibrillation: Secondary | ICD-10-CM

## 2014-06-20 DIAGNOSIS — I340 Nonrheumatic mitral (valve) insufficiency: Secondary | ICD-10-CM

## 2014-06-20 DIAGNOSIS — I480 Paroxysmal atrial fibrillation: Secondary | ICD-10-CM

## 2014-06-20 NOTE — Patient Instructions (Signed)
Your physician wants you to follow-up in:  12 months.  You will receive a reminder letter in the mail two months in advance. If you don't receive a letter, please call our office to schedule the follow-up appointment.   

## 2014-06-20 NOTE — Progress Notes (Signed)
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 He was initially seen as a new consult for his atrial fibrillation by Dr. Jolyn Nap on 04/08/12. He was admitted on 04/08/12 to get a [name] replacement. They removed the old [name] but were not able to insert a new one because of right-sided central venous occlusion. During wire manipulatoin, he developed rapid atrial fibrillation.  In PACU, the atrial fibrillation resolved without medical intervention. At the time of evaluation, he was maintaining sinus rhythm and had no recurrence during the hospital stay. He has frequent seizures. Last echo 05/20/13 with normal LV function, moderate mitral regurgitation. I saw him August 2015 after his Neurologist saw him for frequent seizures requiring CPR due to apnea, unresponsiveness. There was the question of possible associated arrythmias. 3 week event monitor with normal sinus rhythm. No events.   He is here today for follow up. He feels well. No chest pain or SOB. No awareness of palpitations. He has had several seizures but no apnea. These occurred while wearing the monitor.   Primary Care Physician: Anastasia Pall  Past Medical History  Diagnosis Date  . ALLERGIC RHINITIS 12/26/2009  . MITRAL VALVE PROLAPSE 06/22/2007  . SEIZURE DISORDER 06/22/2007  . Unspecified hypothyroidism 06/22/2007  . Growth disorder   . Mental retardation, mild (I.Q. 50-70)   . Brown's syndrome   . Heart murmur     moderate MR  . Sleep apnea     cpap , last sleep study 10/01/11  . Hypokalemia   . Hyperlipidemia     No therapy  . Blood clot in vein     Past Surgical History  Procedure Laterality Date  . Implantation vagal nerve stimulator      Left  . Portacath placement      and removal  04/08/12  . Eye surgery      X2 for [facility] Syndrome    Current Outpatient Prescriptions  Medication Sig Dispense Refill  . carbamazepine (TEGRETOL) 100 MG chewable tablet Chew 250 mg by mouth 3 (three) times daily.      . cetirizine (ZYRTEC) 10 MG tablet Take 10 mg by mouth daily.      . cloBAZam ([name]) 10 MG tablet Take 5 mg by mouth 3 (three) times daily.      . diazepam (VALIUM) 2 MG tablet Take 2 mg by mouth every 6 (six) hours as needed (for seizures).      Marland Kitchen KEPPRA 500 MG tablet Take 3 tablets twice daily  186 tablet  5  . levothyroxine (SYNTHROID, LEVOTHROID) 25 MCG tablet Take 25 mcg by mouth daily before breakfast.      . LORazepam (ATIVAN) 2 MG/ML concentrated solution Withdraw 81ml (2mg ) and 55ml  Normal saline into 3 ml syringe. Administer IV push over 3-5 minutes as needed for recurrent seizures  5 mL  5  . Multiple Vitamin (MULTIVITAMIN WITH MINERALS) TABS Take 1 tablet by mouth daily.      Marland Kitchen PHENObarbital (LUMINAL) 65 MG/ML injection Withdraw 67ml (65mg ) Phenobarbital and 48ml Normal Saline into 13ml syringe. Adminster IV push for 3-5 minutes as needed for recurrent seizures  5 mL  5  . potassium chloride SA (KLOR-CON M20) 20 MEQ tablet Take one tablet 4 times daily  124 tablet  5  . potassium phosphate, monobasic, (K-PHOS ORIGINAL)  500 MG tablet Take 1 tablet (500 mg total) by mouth 4 (four) times daily.  124 tablet  5  . Topiramate ER (TROKENDI XR) 200 MG CP24 Take 200 mg by mouth at bedtime.      Marland Kitchen TROKENDI XR 50 MG CP24 Take one tablet by mouth at bedtime for 2 weeks, then take 3 tablets by mouth at bedtime for 2 weeks, then take 2 tablets by mouth at bedtime for 2 weeks, then take one tablet by mouth at bedtime for 2 weeks, then discontinue.  98 capsule  0  . VIMPAT 50 MG TABS tablet Take 1 tablet in the morning, one tablet in the mid day, and 2 tablets at bedtime  124 tablet  5   No current facility-administered medications for this visit.    Allergies  Allergen Reactions  .  Antihistamines, Chlorpheniramine-Type Other (See Comments)    Other Reaction: Dimetapp causes seizures  . Divalproex Sodium Other (See Comments)    Nausea, vomiting, liver and kidney dysfunction  . Phenytoin Rash and Other (See Comments)    Dilantin causes more seizures.   . Valproic Acid And Related Other (See Comments)    Shuts down systems  . Vancomycin Rash and Other (See Comments)    Other Reaction: Redman's Syndrome    History   Social History  . Marital Status: Single    Spouse Name: N/A    Number of Children: 0  . Years of Education: N/A   Occupational History  . Disabled    Social History Main Topics  . Smoking status: Never Smoker   . Smokeless tobacco: Never Used  . Alcohol Use: No  . Drug Use: No  . Sexual Activity: No   Other Topics Concern  . Not on file   Social History Narrative  . No narrative on file    Family History  Problem Relation Age of Onset  . Breast cancer Mother   . Asthma Father   . Diabetes Father   . Coronary artery disease Maternal Grandfather   . Coronary artery disease Paternal Grandfather   . Lung cancer Paternal Grandfather     Died at 58  . Pneumonia Paternal Grandmother     Died at 55  . Stroke Paternal Grandmother   . Congestive Heart Failure Maternal Grandfather     Died at 23  . Pneumonia Maternal Grandfather     Review of Systems:  As stated in the HPI and otherwise negative.   There were no vitals taken for this visit.  Physical Examination: General: Well developed, well nourished, NAD HEENT: OP clear, mucus membranes moist SKIN: warm, dry. No rashes. Neuro: No focal deficits Musculoskeletal: Muscle strength 5/5 all ext Psychiatric: Mood and affect normal Neck: No JVD, no carotid bruits, no thyromegaly, no lymphadenopathy. Lungs:Clear bilaterally, no wheezes, rhonci, crackles Cardiovascular: Regular rate and rhythm. Slight systolic  Murmur. No gallops or rubs. Abdomen:Soft. Bowel sounds present.  Non-tender.  Extremities: No lower extremity edema. Pulses are 2 + in the bilateral DP/PT.  Echo 05/20/13: Left ventricle: The cavity size was mildly dilated. Wall thickness was normal. Systolic function was normal. The estimated ejection fraction was in the range of 60% to 65%. Wall motion was normal; there were no regional wall motion abnormalities. - Mitral valve: Moderate regurgitation.  EKG: NSR, rate 72 bpm.   Assessment and Plan:   1. Syncope/apnea following seizures: No clear evidence of cardiac arrythmias. Telemetry in hospital without arrythmias. Event monitor with sinus rhythm, no arrythmias.  I suspect that this is all driven by his seizures. Unclear if he is pulseless or just apneic during these events.   2. Atrial fibrillation: One episode during a procedure in July 2013. Likely induced by the wire. No recurrence. He is in normal sinus rhythm today. No further workup or treatment.  He is on coumadin for upper extremity DVT.    3. Mitral regurgitation: Moderate MR by echo August 2014. Unchanged from echo in 2013.

## 2014-07-03 ENCOUNTER — Ambulatory Visit: Payer: Medicaid Other | Admitting: Pediatrics

## 2014-08-14 ENCOUNTER — Other Ambulatory Visit: Payer: Self-pay | Admitting: Family

## 2014-08-21 ENCOUNTER — Ambulatory Visit (INDEPENDENT_AMBULATORY_CARE_PROVIDER_SITE_OTHER): Payer: Medicaid Other | Admitting: Pediatrics

## 2014-08-21 ENCOUNTER — Encounter: Payer: Self-pay | Admitting: Pediatrics

## 2014-08-21 DIAGNOSIS — R2689 Other abnormalities of gait and mobility: Secondary | ICD-10-CM | POA: Insufficient documentation

## 2014-08-21 DIAGNOSIS — G40119 Localization-related (focal) (partial) symptomatic epilepsy and epileptic syndromes with simple partial seizures, intractable, without status epilepticus: Secondary | ICD-10-CM

## 2014-08-21 DIAGNOSIS — G4733 Obstructive sleep apnea (adult) (pediatric): Secondary | ICD-10-CM

## 2014-08-21 DIAGNOSIS — F70 Mild intellectual disabilities: Secondary | ICD-10-CM

## 2014-08-21 DIAGNOSIS — G40311 Generalized idiopathic epilepsy and epileptic syndromes, intractable, with status epilepticus: Secondary | ICD-10-CM

## 2014-08-21 DIAGNOSIS — G40319 Generalized idiopathic epilepsy and epileptic syndromes, intractable, without status epilepticus: Secondary | ICD-10-CM

## 2014-08-21 NOTE — Progress Notes (Signed)
Patient: [name] MRN: [id number] Sex: male DOB: 01-27-1986  Provider: Jodi Geralds, MD Location of Care: [facility]  Note type: Routine return visit  History of Present Illness: Referral Source: Dr. Irena Reichmann  History from: mother, patient and [facility] chart Chief Complaint: Seizures   [name] is a 28 y.o. male who returns on August 21, 2014, for the first time since May 16, 2014.  His seizures remain frequent, but they have not been as severe recently in terms of causing respiratory embarrassment or cardiopulmonary arrest.  His last visit followed two hospitalizations for status epilepticus.  He had replacement of his the battery for his vagal nerve stimulator on April 19, 2014.  The second episode led to respiratory embarrassment, cyanosis, and prolonged seizures.  He was treated with increased impact, but was unable to tolerate it.  He is on antiepileptic polypharmacy.  He has an extensive past medical history, which is reported below.  The unifying diagnosis for his condition has not been found.  Cardiology evaluation noted mild mitral regurgitation on echocardiogram on August 2014.  He had a brief episode of atrial fibrillation during electrodiagnostic studies pursuing to severe central venous occlusion bilaterally from previous [name].  He was seen again on June 20, 2014, by cardiology and again no changes were made.  He has done relatively well since his last visit.  He had generalized tonic-clonic seizures on August 17, 2014, and August 18, 2014.  The first occurred for about a minute or two.  On the August 18, 2014, he had an episode for 20 seconds, which abruptly stopped and 30 minutes later had a brief episode as well.  I wonder if the vagal nerve stimulator did not cut on during those episodes causing cessation of them.  Overall, as he has become older, he has less simple partial seizures and  recently his generalized seizures have not had as severe sequelae as he has experienced in the past year.  In part this comes about because his parents are very aggressive in treating his recurrent seizures by opening his [name] and giving him a combination of phenobarbital and lorazepam, which has been given to him for over 15 years and kept him out of the hospital on numerous occasions.  He is a bit unsteady on his gait on his feet today because of the recent treatments.  Dystaxic gait seems to be a long-term trend when I have seen the patient recently.  Today, he was mentally alert, talking about the [employer] football team and [employer], two things that are of great interest to him.  I re-interrogated his vagal nerve stimulator, findings of which are noted below.  Procedure: Interrogation and reprogramming of Vagal Nerve Stimulator   Implantation of the current stimulator 104 serial # [id number] implanted 04/19/14  Interrogation of the vagal nerve stimulator: Voltage 2.25 mA. Frequency 20 Hz, pulse width 250 microseconds, signal time on 7 seconds, signal time off 1.1 minutes, magnet current 2.50 mA, magnet signal time on 60 seconds, magnet pulse width 250 microseconds.   The normal mode diagnostics using his current settings revealed the battery is sound, and communication, output, and impedance are good; 1834 ohms. 77 stimuli since implantation.  He tolerated the procedure well.  Review of Systems: 12 system review was remarkable for seizures  Past Medical History Diagnosis Date  . ALLERGIC RHINITIS 12/26/2009  . MITRAL VALVE PROLAPSE 06/22/2007  . SEIZURE DISORDER 06/22/2007  . Unspecified hypothyroidism 06/22/2007  .  Growth disorder   . Mental retardation, mild (I.Q. 50-70)   . Brown's syndrome   . Heart murmur     moderate MR  . Sleep apnea     cpap , last sleep study 10/01/11  . Hypokalemia   . Hyperlipidemia     No therapy  . Blood clot in vein    Hospitalizations:  No., Head Injury: No., Nervous System Infections: No., Immunizations up to date: Yes.    Diagnosis of developmental delay as a toddler.   He was a breech presentation, cesarean section delivery. He had intrauterine growth retardation, and failure to thrive.   EEG 11/07/86 was normal for gestational age.   Genetics evaluation short stature, prenatal onset, chromosomal study: 32 XY, fragile X syndrome negative, evaluation for MELAS and MERRF were negative.  Initial seizure 08/28/94 left locomotor with secondary generalization.   MRI of the brain 09/13/93: Scattered subcortical white matter lesions at the parietotemporal parieto-occipital junction is ischemic versus hamartomas.   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   Diagnosis attention deficit disorder inattentive type treated without success with Cylert April 1995  MRI brain 04/04/94 stable differential diagnosis hamartoma, vasculitis, ischemic, or embolic phenomenon.   status epilepticus 01/31/95, and 04/15/95 Neurontin added to Tegretol   MRI brain 06/14/96 unchanged   admission to [facility] Tegretol 30 mcg for militate. 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   Topiramate started in May 1999 unable to be tolerated with Tegretol and was tapered and discontinued.  January 2000 EEG showed right greater than left mid temporal sharp waves was otherwise well-organized EEG. He hospitalizations in March, July, October, in November for recurrent seizures.  Patient placed on Depakote in April 2000 and  developed gagging abdominal pain headache and had significant change in transaminases and liver functions. Topamax was restarted and Keppra was added.  Vimpat was started July 16, 2012 and adjusted upwards.   [name] was added on November 18, 2012 and has been adjusted upwards. Topamax was changed to Trokendi XR in attempt to deal with sleepiness unsteadiness caused by polypharmacy. We to these medications have been introduced, the patient experiences somewhat improved seizure control however Is quite likely that despite better seizure control, the patient is experiencing impairment in cognition and gait as a result of polypharmacy .  MRI of the brain on March 21, 2013. The patient has extensive calcification to the basal ganglia bilaterally, normal ventricle size, and no acute findings. EKG was unchanged.  See office note of May 16, 2014 for recent hospitalizations.  Behavior History none  Surgical History Past Surgical History  Procedure Laterality Date  . Implantation vagal nerve stimulator      Left  . Portacath placement      and removal 04/08/12  . Eye surgery      X2 for Brown Syndrome    Family History family history includes Asthma in his father; Breast cancer in his mother; Congestive Heart Failure in his maternal grandfather; Coronary artery disease in his maternal grandfather and paternal grandfather; Diabetes in his father; Lung cancer in his paternal grandfather; Pneumonia in his maternal grandfather  and paternal grandmother; Stroke in his paternal grandmother. Family history is negative for migraines, seizures, intellectual disabilities, blindness, deafness, birth defects, chromosomal disorder, or autism.  Social History . Marital Status: Single    Spouse Name: N/A    Number of Children: 0  . Years of Education: N/A   Occupational History  . Disabled    Social History Main Topics  . Smoking status: Never Smoker   . Smokeless tobacco: Never Used  . Alcohol  Use: No  . Drug Use: No  . Sexual Activity: No   Social History Narrative  Educational level special education Living with both parents  Hobbies/Interest: Enjoys doing household chores and helping care for his niece.  School comments: [name] completed his education with a certificate from [employer] in 2010.  Allergies Allergen Reactions  . Antihistamines, Chlorpheniramine-Type Other (See Comments)    Other Reaction: Dimetapp causes seizures  . Divalproex Sodium Other (See Comments)    Nausea, vomiting, liver and kidney dysfunction  . Phenytoin Rash and Other (See Comments)    Dilantin causes more seizures.   . Valproic Acid And Related Other (See Comments)    Shuts down systems  . Vancomycin Rash and Other (See Comments)    Other Reaction: Redman's Syndrome   Physical Exam BP 100/79 mmHg  Pulse 96  Ht 5' 2.5" (1.588 m)  Wt 112 lb 3.2 oz (50.894 kg)  BMI 20.18 kg/m2  General: thin young man, seated on the examination table, in no distress, right-handed, sandy hair, blue/brown eyes; Awake, alert, animated, talkative.  Head: head microcephalic and atraumatic, His head is slightly turned with the right ear toward his right shoulder and chin points the left. He does not have torticollis. Left face and neck are swollen.  Ears, Nose and Throat: beak-like nose. He has a low lying palate. Wax occluding external auditory canals  Neck: supple with no carotid or supraclavicular bruits.  Respiratory: Lungs are clear to auscultation  Cardiovascular: regular rate and rhythm, soft holosystolic murmur at left sternal border. His nailbeds are pink.  Musculoskeletal: short stature with head tilted to the right ear. He has tight heel cords.  Skin: no rashes or lesions.  Trunk: no scoliosis. His portacath in the right upper chest. He has a vagal nerve stimulator in the left upper chest.  Neurologic Exam  Mental Status: Awake, alert. Oriented to place and time. Attention span,  concentration, and fund of knowledge appropriate for him. He has evidence of mild intellectual disability. Mood and affect appropriate. 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 Pupils equal, briskly reactive to light. Extraocular movements full but dysconjugate. Visual fields full to confrontation. Hearing intact to air conduction greater than bone conduction. Facial sensation intact. Face, tongue, palate move normally and symmetrically. Neck flexion and extension normal. 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 He had a slight tic-like movement of his mouth.  Motor: Diminished bulk and normal tone. The patient has diffuse mild weakness in his proximal and distal muscles in the 4-4+ over 5 range.He had normal strength in his legs. He has clumsy fine motor movements. His hands are semiflexed at the distal joints at rest but he can extend them.  Sensory: Intact to light touch,cold, vibration, and proprioception in all extremities; good stereognosis  Coordination: Rapid alternating movements are clumsy in all extremities. Finger-to-nose and heel-to-shin performed accurately bilaterally. Romberg negative.  Gait and  Station: Right foot is in a somewhat valgus position, but the heel strike for both feet is good. His gait is slightly broad-based and was less steady in more ataxic today than on his last visit.  Reflexes: Absent and symmetric. Toes downgoing.  Assessment 1. 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 2. 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 3. Mild intellectual disability, F70. 4. Obstructive sleep apnea, G47.33. 5. 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  Discussion As mentioned above, overall [name] seems to be neurologically stable.  In general, his seizures seem to be better controlled.  I am certain that some of his  gait disorder represents polypharmacy.  His seizures in the past year have been very serious both in prolonged episodes, and in pulmonary and cardiovascular effects that required hospitalization and intervention.  With the use of polypharmacy, and his vagal nerve stimulator, this recently seems to be better.  I made no changes in his medication today.  His mother did not require prescription refill.  I interrogated his vagal nerve stimulator and made no changes in it.  It is working well.  Plan He will return in three months for ongoing evaluation management of his seizures and neurologic disorder.  I spent 30 minutes of face-to-face time with [name] and his mother in addition to reprogramming his vagal nerve stimulator.   Medication List   This list is accurate as of: 08/21/14 11:59 PM.       carbamazepine 100 MG chewable tablet  Commonly known as:  TEGRETOL  Chew 250 mg by mouth 3 (three) times daily.     cetirizine 10 MG tablet  Commonly known as:  ZYRTEC  Take 10 mg by mouth daily.     diazepam 2 MG tablet  Commonly known as:  VALIUM  Take 2 mg by mouth every 6 (six) hours as needed (for seizures).     KEPPRA 500 MG tablet  Generic drug:  levETIRAcetam  Take 3 tablets twice daily     levothyroxine 25 MCG tablet  Commonly known as:  SYNTHROID, LEVOTHROID  Take 25 mcg by mouth daily before breakfast.     LORazepam 2 MG/ML concentrated solution  Commonly known as:  ATIVAN  Withdraw 2ml (2mg ) and 66ml  Normal saline into 3 ml syringe. Administer IV push over 3-5 minutes as needed for recurrent seizures     multivitamin with minerals Tabs tablet  Take 1 tablet by mouth daily.     [name] 10 MG tablet  Generic drug:  cloBAZam  TAKE ONE-HALF TABLET BY MOUTH THREE TIMES DAILY     PHENObarbital 65 MG/ML injection  Commonly known as:  LUMINAL  Withdraw 12ml (65mg ) Phenobarbital and 30ml Normal Saline into 68ml syringe. Adminster IV push for 3-5 minutes as needed for recurrent seizures      potassium chloride SA 20 MEQ tablet  Commonly known as:  KLOR-CON M20  Take one tablet 4 times daily     potassium phosphate (monobasic) 500 MG tablet  Commonly known as:  K-PHOS ORIGINAL  Take 1 tablet (500 mg total) by mouth 4 (four) times daily.     TROKENDI XR 200 MG Cp24  Generic drug:  Topiramate ER  Take 200 mg by mouth at bedtime.     TROKENDI XR 50 MG Cp24  Generic drug:  Topiramate ER  Take one tablet by mouth at bedtime for 2 weeks, then take 3 tablets by mouth at bedtime for 2 weeks, then take 2 tablets by mouth at bedtime for 2  weeks, then take one tablet by mouth at bedtime for 2 weeks, then discontinue.     VIMPAT 50 MG Tabs tablet  Generic drug:  lacosamide  Take 1 tablet in the morning, one tablet in the mid day, and 2 tablets at bedtime      The medication list was reviewed and reconciled. All changes or newly prescribed medications were explained.  A complete medication list was provided to the patient/caregiver.  Jodi Geralds MD

## 2014-09-13 ENCOUNTER — Other Ambulatory Visit: Payer: Self-pay

## 2014-09-13 DIAGNOSIS — G40401 Other generalized epilepsy and epileptic syndromes, not intractable, with status epilepticus: Secondary | ICD-10-CM

## 2014-09-13 DIAGNOSIS — G40319 Generalized idiopathic epilepsy and epileptic syndromes, intractable, without status epilepticus: Secondary | ICD-10-CM

## 2014-09-13 MED ORDER — PHENOBARBITAL SODIUM 65 MG/ML IJ SOLN: INTRAMUSCULAR | Status: DC

## 2014-09-13 MED ORDER — LORAZEPAM 2 MG/ML PO CONC: ORAL | Status: DC

## 2014-09-13 NOTE — Telephone Encounter (Signed)
Pharmacist from [facility] [facility] called requesting new Rx's for pt's Phenobarbital 65 mg/mL injection and Lorazepam 2 mg/mL concentrated solution. He asked that we fax it to them at : [phone].

## 2014-09-25 ENCOUNTER — Other Ambulatory Visit: Payer: Self-pay

## 2014-09-25 MED ORDER — TROKENDI XR 200 MG PO CP24: ORAL_CAPSULE | ORAL | Status: DC

## 2014-10-06 ENCOUNTER — Other Ambulatory Visit: Payer: Self-pay | Admitting: Family

## 2014-12-01 ENCOUNTER — Other Ambulatory Visit: Payer: Self-pay | Admitting: Pediatrics

## 2014-12-05 ENCOUNTER — Other Ambulatory Visit: Payer: Self-pay | Admitting: Pediatrics

## 2014-12-12 ENCOUNTER — Ambulatory Visit: Payer: Self-pay | Admitting: Internal Medicine

## 2014-12-12 DIAGNOSIS — Z5181 Encounter for therapeutic drug level monitoring: Secondary | ICD-10-CM

## 2014-12-14 ENCOUNTER — Ambulatory Visit (INDEPENDENT_AMBULATORY_CARE_PROVIDER_SITE_OTHER): Payer: Medicaid Other | Admitting: Pediatrics

## 2014-12-14 ENCOUNTER — Encounter: Payer: Self-pay | Admitting: Pediatrics

## 2014-12-14 VITALS — BP 100/70 | HR 84 | Ht 62.5 in | Wt 112.2 lb

## 2014-12-14 DIAGNOSIS — G40119 Localization-related (focal) (partial) symptomatic epilepsy and epileptic syndromes with simple partial seizures, intractable, without status epilepticus: Secondary | ICD-10-CM

## 2014-12-14 DIAGNOSIS — F70 Mild intellectual disabilities: Secondary | ICD-10-CM

## 2014-12-14 DIAGNOSIS — H5060 Mechanical strabismus, unspecified: Secondary | ICD-10-CM

## 2014-12-14 DIAGNOSIS — G40311 Generalized idiopathic epilepsy and epileptic syndromes, intractable, with status epilepticus: Secondary | ICD-10-CM

## 2014-12-14 DIAGNOSIS — E876 Hypokalemia: Secondary | ICD-10-CM

## 2014-12-14 DIAGNOSIS — G40319 Generalized idiopathic epilepsy and epileptic syndromes, intractable, without status epilepticus: Secondary | ICD-10-CM

## 2014-12-14 DIAGNOSIS — M24572 Contracture, left ankle: Secondary | ICD-10-CM | POA: Diagnosis not present

## 2014-12-14 DIAGNOSIS — M24575 Contracture, left foot: Secondary | ICD-10-CM

## 2014-12-14 MED ORDER — POTASSIUM CHLORIDE CRYS ER 20 MEQ PO TBCR: EXTENDED_RELEASE_TABLET | ORAL | Status: DC

## 2014-12-14 MED ORDER — POTASSIUM PHOSPHATE MONOBASIC 500 MG PO TABS: 500.0000 mg | ORAL_TABLET | Freq: Four times a day (QID) | ORAL | Status: DC

## 2014-12-14 NOTE — Progress Notes (Signed)
Patient: [name] MRN: [id number] Sex: male DOB: 16-Sep-1986  Provider: Jodi Geralds, MD Location of Care: [facility]  Note type: Routine return visit  History of Present Illness: Referral Source: Dr. Irena Reichmann History from: both parents, patient and [facility] chart Chief Complaint: VNS Check Up/Seizures  [name] is a 29 y.o. male who returns December 14, 2014 for the first time since August 21, 2014.  Since his last visit, seizures have markedly improved in their severity.  He has not had problems with cardiopulmonary arrest with his seizures.  VNS seems to abort his seizures at least twice a week.  His last generalized tonic-clonic seizure was in February.  His parents cannot remember when he last complained of somatosensory seizures.  He recently had a special boot made for his left foot.  He has an equinus deformity of it.  The only way that it could be otherwise treated would be to put a pin in his foot to counter balance the torque, and his orthopedic surgeon believes that is not indicated currently.  He is sleeping well as long as he wears his CPAP device.  He has occasional migraines once or twice a month that put him to bed for about three hours.  He takes Tylenol and goes to sleep.  He began to go to the [name] and gently workout, which I think is a very good thing.  He seems more alert and is into sports on TV of all sorts.  At home he has been put to work doing his laundry, loading the dishwasher, and doing some other cleaning.  He is able to take care of his activities of daily living.  His growth is stable.  He has not gained or lost weight.  He does not seem to be as impaired as when I saw him previously either in terms of slurred speech, somnolence, or unsteady gait.  He is on for antiepileptic medications and the vagal nerve stimulator.  I interrogated the vagal nerve stimulator and the results are below.  The device  is working well.  Procedure: Interrogation and reprogramming of Vagal Nerve Stimulator  Implantation of the current stimulator 104 serial # [id number] implanted 04/19/14  Interrogation of the vagal nerve stimulator: Voltage 2.25 mA. Frequency 20 Hz, pulse width 250 microseconds, signal time on 7 seconds, signal time off 1.1 minutes, magnet current 2.50 mA, magnet signal time on 60 seconds, magnet pulse width 250 microseconds. I reprogrammed the computer for diagnostics.  The normal mode diagnostics using his current settings revealed the battery is nearly full, and communication, output, and impedance are good; 1834 ohms. 84 stimuli since implantation, 7 since last interrogation.  He tolerated the procedure well.  Review of Systems: 12 system review was remarkable for seizures and ear infection   Past Medical History Diagnosis Date  . ALLERGIC RHINITIS 12/26/2009  . MITRAL VALVE PROLAPSE 06/22/2007  . SEIZURE DISORDER 06/22/2007  . Unspecified hypothyroidism 06/22/2007  . Growth disorder   . Mental retardation, mild (I.Q. 50-70)   . Brown's syndrome   . Heart murmur     moderate MR  . Sleep apnea     cpap , last sleep study 10/01/11  . Hypokalemia   . Hyperlipidemia     No therapy  . Blood clot in vein    Hospitalizations: No., Head Injury: No., Nervous System Infections: No., Immunizations up to date: Yes.    Diagnosis of developmental delay as a toddler.  EEG 11/07/86 was normal for gestational age.   Genetics evaluation short stature, prenatal onset, chromosomal study: 7 XY, fragile X syndrome negative, evaluation for MELAS and MERRF were negative.  Initial seizure 08/28/94 left locomotor with secondary generalization.   MRI of the brain 09/13/93: Scattered subcortical white matter lesions at the parietotemporal parieto-occipital junction is ischemic versus hamartomas.   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   Diagnosis attention deficit disorder inattentive type treated without success with Cylert April 1995  MRI brain 04/04/94 stable differential diagnosis hamartoma, vasculitis, ischemic, or embolic phenomenon.   status epilepticus 01/31/95, and 04/15/95 Neurontin added to Tegretol   MRI brain 06/14/96 unchanged   admission to [facility]. Tegretol 30 mcg for militate. 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   Topiramate started in May 1999 unable to be tolerated with Tegretol and was tapered and discontinued.  January 2000 EEG showed right greater than left mid temporal sharp waves was otherwise well-organized EEG. He hospitalizations in March, July, October, in November for recurrent seizures.  Patient placed on Depakote in April 2000 and developed gagging abdominal pain headache and had significant change in transaminases and liver functions. Topamax was restarted and Keppra was added.  Vimpat was started July 16, 2012 and adjusted upwards.   [name] was added on November 18, 2012 and has been adjusted upwards. Topamax was changed to Trokendi XR in attempt to deal with sleepiness unsteadiness caused by polypharmacy. We to these medications have been introduced, the patient experiences somewhat improved seizure control however Is quite likely that despite better seizure control, the patient is experiencing impairment in cognition and gait as a result of polypharmacy .  MRI of the brain on March 21, 2013. The patient has extensive calcification to the basal ganglia bilaterally, normal ventricle size, and no acute findings.  EKG was unchanged.  See office note of May 16, 2014 for recent hospitalizations.  Birth History He was a breech presentation, cesarean section delivery. He had intrauterine growth retardation, and failure to thrive.   Behavior History none  Surgical History Past Surgical History  Procedure Laterality Date  . Implantation vagal nerve stimulator      Left  . Portacath placement      and removal 04/08/12  . Eye surgery      X2 for Brown Syndrome    Family History family history includes Asthma in his father; Breast cancer in his mother; Congestive Heart Failure in his maternal grandfather; Coronary artery disease in his maternal grandfather and paternal grandfather; Diabetes in his father; Lung cancer in his paternal grandfather; Pneumonia in his maternal grandfather and paternal grandmother; Stroke in his paternal grandmother. Family history is negative for migraines, seizures, intellectual disabilities, blindness, deafness, birth defects, chromosomal disorder, or autism.  Social History . Marital Status: Single    Spouse Name: N/A  . Number of Children: 0  . Years of Education: N/A   Occupational History  . Disabled    Social History Main Topics  . Smoking status: Never Smoker   . Smokeless tobacco: Never Used  . Alcohol Use: No  . Drug Use:  No  . Sexual Activity: No   Social History Narrative   Educational level 12th grade   Living with both parents   Hobbies/Interest: Enjoys watching [place] [employer], newspaper.  School comments [name] completed his education at [employer] in 2010.   Allergies Allergen Reactions  . Antihistamines, Chlorpheniramine-Type Other (See Comments)    Other Reaction: Dimetapp causes seizures  . Divalproex Sodium Other (See Comments)    Nausea, vomiting, liver and kidney dysfunction  . Phenytoin Rash and Other (See Comments)    Dilantin causes more seizures.   . Valproic Acid And Related Other (See Comments)    Shuts  down systems  . Vancomycin Rash and Other (See Comments)    Other Reaction: Redman's Syndrome   Physical Exam BP 100/70 mmHg  Pulse 84  Ht 5' 2.5" (1.588 m)  Wt 112 lb 3.2 oz (50.894 kg)  BMI 20.18 kg/m2  General: thin young man, seated on the examination table, in no distress, right-handed, sandy hair, blue/brown eyes; Awake, alert, animated, talkative.  Head: head microcephalic and atraumatic, His head is slightly turned with the right ear toward his right shoulder and chin points the left. He does not have torticollis. Left face and neck are less swollen than last visit  Ears, Nose and Throat: beak-like nose. He has a low lying palate. Wax occluding external auditory canals  Neck: supple with no carotid or supraclavicular bruits.  Respiratory: Lungs are clear to auscultation  Cardiovascular: regular rate and rhythm, soft holosystolic murmur at left sternal border. His nailbeds are pink.  Musculoskeletal: short stature with head tilted to the right ear. He has tight heel cords.  Skin: no rashes or lesions.  Trunk: no scoliosis. His portacath in the right upper chest. He has a vagal nerve stimulator in the left upper chest.  Neurologic Exam  Mental Status: Awake, alert. oriented to place. Attention span, concentration, and fund of knowledge are below average. He has evidence of mild intellectual disability. Mood and affect appropriate. 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 Pupils equal, briskly reactive to light. Extraocular movements full but dysconjugate. Visual fields full to confrontation. Hearing intact to air conduction greater than bone conduction. Facial sensation intact. Face, tongue, palate move normally and symmetrically. Neck flexion and extension normal. 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 He had a slight tic-like movement of his  mouth.  Motor: Diminished bulk and normal tone. The patient has diffuse mild weakness in his proximal and distal muscles in the 4-4+ over 5 range.He had normal strength in his legs. He has clumsy fine motor movements. His hands are semiflexed at the distal joints at rest but he can extend them.  Sensory: Intact to light touch,cold, vibration, and proprioception in all extremities; good stereognosis  Coordination: Rapid alternating movements are clumsy in all extremities. Finger-to-nose and heel-to-shin performed accurately bilaterally. Romberg negative.  Gait and Station: Right foot is in a somewhat valgus position, but the heel strike for both feet is good. His gait is slightly broad-based and was less steady in more ataxic today than on his last visit.  Reflexes: Absent and symmetric. Toes downgoing.  Assessment 1. 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 2. 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 3. 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 4. Hypokalemia, E87.6. 5. Mild intellectual disability, F70. 6. 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  Discussion [name] is stable  medically, though he continues to have fairly frequent seizures, they do not seem to be systemically problematic as they had previously.  In addition the vagal nerve stimulator seems as if it is shortening the seizures so that they are less severe.  Plan No change in his current antiepileptic medicines.  I refilled prescriptions for both potassium medicines.  I will refill the others as needed.  He will return in four months for routine visit and interrogating and reprograming his vagal nerve stimulator.  I spent 30 minutes of face-to-face time with [name], and his parents.  In addition I interrogated and reprogrammed his vagal nerve stimulator.   Medication List   This list is accurate as of: 12/14/14 11:59 PM.       carbamazepine 100 MG chewable tablet  Commonly known as:  TEGRETOL    Chew 250 mg by mouth 3 (three) times daily.     diazepam 2 MG tablet  Commonly known as:  VALIUM  Take 2 mg by mouth every 6 (six) hours as needed (for seizures).     KEPPRA 500 MG tablet  Generic drug:  levETIRAcetam  Take 3 tablets twice daily     levothyroxine 25 MCG tablet  Commonly known as:  SYNTHROID, LEVOTHROID  Take 25 mcg by mouth daily before breakfast.     LORazepam 2 MG/ML concentrated solution  Commonly known as:  ATIVAN  Withdraw 72ml (2mg ) and 29ml  Normal saline into 3 ml syringe. Administer IV push over 3-5 minutes as needed for recurrent seizures     multivitamin with minerals Tabs tablet  Take 1 tablet by mouth daily.     [name] 10 MG tablet  Generic drug:  cloBAZam  TAKE ONE-HALF TABLET BY MOUTH THREE TIMES DAILY     PHENObarbital 65 MG/ML injection  Commonly known as:  LUMINAL  Withdraw 23ml (65mg ) Phenobarbital and 51ml Normal Saline into 68ml syringe. Adminster IV push for 3-5 minutes as needed for recurrent seizures     potassium chloride SA 20 MEQ tablet  Commonly known as:  KLOR-CON M20  TAKE ONE TABLET BY MOUTH 4 TIMES DAILY.     potassium phosphate (monobasic) 500 MG tablet  Commonly known as:  K-PHOS ORIGINAL  Take 1 tablet (500 mg total) by mouth 4 (four) times daily.     Topiramate ER 50 MG Cp24  Take 50 mg by mouth at bedtime.     TROKENDI XR 200 MG Cp24  Generic drug:  Topiramate ER  Take 1 capsule at bedtime     VIMPAT 50 MG Tabs tablet  Generic drug:  lacosamide  TAKE ONE TABLET BY MOUTH IN THE MORNING,ONE TABLET IN THE MID DAY,AND 2 TABLETS AT BEDTIME      The medication list was reviewed and reconciled. All changes or newly prescribed medications were explained.  A complete medication list was provided to the patient/caregiver.  Jodi Geralds MD

## 2015-01-22 ENCOUNTER — Other Ambulatory Visit: Payer: Self-pay | Admitting: Family

## 2015-01-22 DIAGNOSIS — G40109 Localization-related (focal) (partial) symptomatic epilepsy and epileptic syndromes with simple partial seizures, not intractable, without status epilepticus: Secondary | ICD-10-CM

## 2015-01-22 MED ORDER — ONFI 10 MG PO TABS: ORAL_TABLET | ORAL | Status: DC

## 2015-03-12 ENCOUNTER — Telehealth: Payer: Self-pay

## 2015-03-12 NOTE — Telephone Encounter (Signed)
Ann, Pharmacist form [facility], lvm requesting a call back. She said that they need to update Rx's for pt's Heparin and Saline for flushing his port monthly, per protocol. Please call Ann at: [phone].

## 2015-03-12 NOTE — Telephone Encounter (Signed)
I called and spoke with Lelon Frohlich at [facility]. I approved Heparin and Saline flushes for [name] per protocol. TG

## 2015-03-12 NOTE — Telephone Encounter (Signed)
Noted, thank you

## 2015-03-29 ENCOUNTER — Other Ambulatory Visit: Payer: Self-pay | Admitting: Family

## 2015-04-17 ENCOUNTER — Encounter: Payer: Self-pay | Admitting: Pediatrics

## 2015-04-17 ENCOUNTER — Ambulatory Visit (INDEPENDENT_AMBULATORY_CARE_PROVIDER_SITE_OTHER): Payer: Medicaid Other | Admitting: Pediatrics

## 2015-04-17 VITALS — BP 97/60 | HR 80 | Ht 62.5 in | Wt 114.0 lb

## 2015-04-17 DIAGNOSIS — G40119 Localization-related (focal) (partial) symptomatic epilepsy and epileptic syndromes with simple partial seizures, intractable, without status epilepticus: Secondary | ICD-10-CM | POA: Diagnosis not present

## 2015-04-17 DIAGNOSIS — R269 Unspecified abnormalities of gait and mobility: Secondary | ICD-10-CM

## 2015-04-17 DIAGNOSIS — R112 Nausea with vomiting, unspecified: Secondary | ICD-10-CM | POA: Diagnosis not present

## 2015-04-17 DIAGNOSIS — F70 Mild intellectual disabilities: Secondary | ICD-10-CM

## 2015-04-17 DIAGNOSIS — G40311 Generalized idiopathic epilepsy and epileptic syndromes, intractable, with status epilepticus: Secondary | ICD-10-CM | POA: Diagnosis not present

## 2015-04-17 DIAGNOSIS — G40319 Generalized idiopathic epilepsy and epileptic syndromes, intractable, without status epilepticus: Secondary | ICD-10-CM

## 2015-04-17 MED ORDER — ONDANSETRON 4 MG PO TBDP: ORAL_TABLET | ORAL | Status: DC

## 2015-04-17 NOTE — Progress Notes (Signed)
Patient: [name] MRN: [id number] Sex: male DOB: 1986-08-26  Provider: Jodi Geralds, MD Location of Care: [facility]  Note type: Routine return visit  History of Present Illness: Referral Source: Dr. Irena Reichmann History from: mother, patient and [facility] chart Chief Complaint: VNS Check/Seizures   [name] is a 29 y.o. male who returns April 17, 2015, for the first time since December 14, 2014.  He has a complex nervous system disorder that recently has been stable.  He has two to three generalized tonic-clonic seizures per month.  His mother believes that recently his seizures come when he gets overheated when he is outside.  His somatosensory seizures have virtually disappeared.  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  He is on quadruple medications plus vagal nerve stimulator.  At least his seizures are now such that though he has apnea, he is not becoming deeply cyanotic or having any problems with cardiac rhythm.  I interrogated his vagal nerve stimulator and did not re-program it.  This will be described below.  His health has been relatively good.  His neurologic examination is stable.  He seems more alert and to have better memory and more distinct speech.  He is listing to the left side when he walks and needs to have a brace on his left leg.  I do not think that he needs to have further imaging of his brain.  Review of Systems: 12 system review was remarkable for seizures and left ankle pain   Past Medical History Diagnosis Date  . ALLERGIC RHINITIS 12/26/2009  . MITRAL VALVE PROLAPSE 06/22/2007  . SEIZURE DISORDER 06/22/2007  . Unspecified hypothyroidism 06/22/2007  . Growth disorder   . Mental retardation, mild (I.Q. 50-70)   . Brown's syndrome   . Heart murmur     moderate MR  . Sleep apnea     cpap , last sleep study 10/01/11  . Hypokalemia   . Hyperlipidemia     No  therapy  . Blood clot in vein    Hospitalizations: No., Head Injury: No., Nervous System Infections: No., Immunizations up to date: Yes.    Diagnosis of developmental delay as a toddler.   EEG 11/07/86 was normal for gestational age.   Genetics evaluation short stature, prenatal onset, chromosomal study: 64 XY, fragile X syndrome negative, evaluation for MELAS and MERRF were negative.  Initial seizure 08/28/94 left locomotor with secondary generalization.   MRI of the brain 09/13/93: Scattered subcortical white matter lesions at the parietotemporal parieto-occipital junction is ischemic versus hamartomas.   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   Diagnosis attention deficit disorder inattentive type treated without success with Cylert April 1995  MRI brain 04/04/94 stable differential diagnosis hamartoma, vasculitis, ischemic, or embolic phenomenon.   status epilepticus 01/31/95, and 04/15/95 Neurontin added to Tegretol   MRI brain 06/14/96 unchanged   admission to [facility]. Tegretol 30 mcg for militate. 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   Topiramate started in May 1999 unable to be tolerated with  Tegretol and was tapered and discontinued.  January 2000 EEG showed right greater than left mid temporal sharp waves was otherwise well-organized EEG. He hospitalizations in March, July, October, in November for recurrent seizures.  Patient placed on Depakote in April 2000 and developed gagging abdominal pain headache and had significant change in  transaminases and liver functions. Topamax was restarted and Keppra was added.  Vimpat was started July 16, 2012 and adjusted upwards.   [name] was added on November 18, 2012 and has been adjusted upwards. Topamax was changed to Trokendi XR in attempt to deal with sleepiness unsteadiness caused by polypharmacy. We to these medications have been introduced, the patient experiences somewhat improved seizure control however Is quite likely that despite better seizure control, the patient is experiencing impairment in cognition and gait as a result of polypharmacy .  MRI of the brain on March 21, 2013. The patient has extensive calcification to the basal ganglia bilaterally, normal ventricle size, and no acute findings. EKG was unchanged  Birth History He was a breech presentation, cesarean section delivery. He had intrauterine growth retardation, and failure to thrive.   Behavior History none  Surgical History Procedure Laterality Date  . Implantation vagal nerve stimulator      Left  . Portacath placement      and removal 04/08/12  . Eye surgery      X2 for Brown Syndrome   Family History family history includes Asthma in his father; Breast cancer in his mother; Congestive Heart Failure in his maternal grandfather; Coronary artery disease in his maternal grandfather and paternal grandfather; Diabetes in his father; Lung cancer in his paternal grandfather; Pneumonia in his maternal grandfather and paternal grandmother; Stroke in his paternal grandmother. Family history is negative for migraines, seizures, intellectual disabilities, blindness, deafness, birth defects, chromosomal disorder, or autism.  Social History . Marital Status: Single    Spouse Name: N/A  . Number of Children: 0  . Years of Education: N/A   Occupational History  . Disabled    Social History Main Topics  . Smoking status: Never Smoker   . Smokeless tobacco: Never Used  . Alcohol Use: No  . Drug Use: No  .  Sexual Activity: No   Social History Narrative   Educational level 12th grade  Living with both parents   Hobbies/Interest: Web designer, pro football and college basketball.   Allergies Allergen Reactions  . Antihistamines, Chlorpheniramine-Type Other (See Comments)    Other Reaction: Dimetapp causes seizures  . Divalproex Sodium Other (See Comments)    Nausea, vomiting, liver and kidney dysfunction  . Phenytoin Rash and Other (See Comments)    Dilantin causes more seizures.   . Valproic Acid And Related Other (See Comments)    Shuts down systems  . Vancomycin Rash and Other (See Comments)    Other Reaction: Redman's Syndrome   Physical Exam BP 97/60 mmHg  Pulse 80  Ht 5' 2.5" (1.588 m)  Wt 114 lb (51.71 kg)  BMI 20.51 kg/m2  General: thin young man, seated on the examination table, in no distress, right-handed, sandy hair, blue/brown eyes; Awake, alert, animated, talkative.  Head: head microcephalic and atraumatic, His head is slightly turned with the right ear toward his right shoulder and chin points the left. He does not have torticollis. Left face and neck are less swollen than last visit  Ears, Nose and Throat: beak-like nose. He has a low lying palate. Wax occluding external auditory canals  Neck: supple with no  carotid or supraclavicular bruits.  Respiratory: Lungs are clear to auscultation  Cardiovascular: regular rate and rhythm, soft holosystolic murmur at left sternal border. His nailbeds are pink.  Musculoskeletal: short stature with head tilted to the right ear. He has tight heel cords.  Skin: no rashes or lesions.  Trunk: no scoliosis. His portacath in the right upper chest. He has a vagal nerve stimulator in the left upper chest.  Neurologic Exam  Mental Status: Awake, alert. oriented to place. Attention span, concentration, and fund of knowledge are below average. He has evidence of mild intellectual disability. Mood and affect  appropriate. 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 Pupils equal, briskly reactive to light. Extraocular movements full but dysconjugate. Visual fields full to confrontation. Hearing intact to air conduction greater than bone conduction. Facial sensation intact. Face, tongue, palate move normally and symmetrically. Neck flexion and extension normal. 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 He had a slight tic-like movement of his mouth.  Motor: Diminished bulk and normal tone. The patient has diffuse mild weakness in his proximal and distal muscles in the 4-4+ over 5 range.He had normal strength in his legs. He has clumsy fine motor movements. His hands are semiflexed at the distal joints at rest but he can extend them.  Sensory: Intact to light touch,cold, vibration, and proprioception in all extremities; good stereognosis  Coordination: Rapid alternating movements are clumsy in all extremities. Finger-to-nose and heel-to-shin performed accurately bilaterally. Romberg negative.  Gait and Station: Right foot is in a somewhat valgus position, but the heel strike for both feet is good. His gait is slightly broad-based and was less steady in more ataxic today than on his last visit.  Reflexes: Absent and symmetric. Toes downgoing.  Assessment 1. 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 2. 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 3. Abnormality of gait, R26.9. 4. Mild intellectual disability, F70. 5. Non-intractable vomiting with nausea of unspecified type.  Discussion [name] is remarkably stable despite his seizures.  I think in general his seizures were less prominent and he seems more alert as I noted above.  Plan I interrogated his vagal nerve stimulator and did not change its settings.  I will plan to see him in four months'  time.  His mother knows to call if he has significant increase in seizures.  I spent 30 minutes of face-to-face time with [name] and his mother, more than half of it in consultation.   Medication List   This list is accurate as of: 04/17/15 11:59 PM.       carbamazepine 100 MG chewable tablet  Commonly known as:  TEGRETOL  CHEW AND SWALLOW TWO AND ONE-HALF TABLET THREE TIMES DAILY     cetirizine 10 MG tablet  Commonly known as:  ZYRTEC  Take 10 mg by mouth daily.     CRESTOR 10 MG tablet  Generic drug:  rosuvastatin  Take 10 mg by mouth at bedtime.     diazepam 2 MG tablet  Commonly known as:  VALIUM  Take 2 mg by mouth every 6 (six) hours as needed (for seizures).     KEPPRA 500 MG tablet  Generic drug:  levETIRAcetam  Take 3 tablets twice daily     levothyroxine 25 MCG tablet  Commonly known as:  SYNTHROID, LEVOTHROID  Take 25 mcg by mouth daily before breakfast.     LORazepam 2  MG/ML concentrated solution  Commonly known as:  ATIVAN  Withdraw 47ml (2mg ) and 63ml  Normal saline into 3 ml syringe. Administer IV push over 3-5 minutes as needed for recurrent seizures     multivitamin with minerals Tabs tablet  Take 1 tablet by mouth daily.     ondansetron 4 MG disintegrating tablet  Commonly known as:  ZOFRAN-ODT  Take 1 tablet as needed for nausea and vomiting     [name] 10 MG tablet  Generic drug:  cloBAZam  TAKE ONE-HALF TABLET BY MOUTH THREE TIMES DAILY     PHENObarbital 65 MG/ML injection  Commonly known as:  LUMINAL  Withdraw 38ml (65mg ) Phenobarbital and 66ml Normal Saline into 40ml syringe. Adminster IV push for 3-5 minutes as needed for recurrent seizures     potassium chloride SA 20 MEQ tablet  Commonly known as:  KLOR-CON M20  TAKE ONE TABLET BY MOUTH 4 TIMES DAILY.     potassium phosphate (monobasic) 500 MG tablet  Commonly known as:  K-PHOS ORIGINAL  Take 1 tablet (500 mg total) by mouth 4 (four) times daily.     SINGULAIR 10 MG tablet  Generic drug:   montelukast  Take 10 mg by mouth at bedtime.     Topiramate ER 50 MG Cp24  Take 50 mg by mouth at bedtime.     TROKENDI XR 200 MG Cp24  Generic drug:  Topiramate ER  Take 1 capsule at bedtime     VIMPAT 50 MG Tabs tablet  Generic drug:  lacosamide  TAKE ONE TABLET BY MOUTH IN THE MORNING,ONE TABLET IN THE MID DAY,AND 2 TABLETS AT BEDTIME      The medication list was reviewed and reconciled. All changes or newly prescribed medications were explained.  A complete medication list was provided to the patient/caregiver.  Jodi Geralds MD

## 2015-04-20 ENCOUNTER — Telehealth: Payer: Self-pay | Admitting: *Deleted

## 2015-04-20 NOTE — Telephone Encounter (Signed)
Patient's mother called and wanted to know if you had by chance found his oxygen tank after his visit with Dr. Gaynell Face last Tuesday the 26th of July. She states it's in a small black portable bag and patient's wallet should be in the side pocket.

## 2015-04-20 NOTE — Telephone Encounter (Signed)
I left a message letting mom know that I did not find the patients black bag with his oxygen tank and wallet. MB

## 2015-04-22 ENCOUNTER — Other Ambulatory Visit: Payer: Self-pay | Admitting: Pediatrics

## 2015-05-13 ENCOUNTER — Encounter (HOSPITAL_COMMUNITY): Payer: Self-pay

## 2015-05-13 ENCOUNTER — Emergency Department (HOSPITAL_COMMUNITY)
Admission: EM | Admit: 2015-05-13 | Discharge: 2015-05-13 | Disposition: A | Discharge status: Home / Self Care | Payer: Medicaid Other | Attending: Emergency Medicine | Admitting: Emergency Medicine

## 2015-05-13 ENCOUNTER — Emergency Department (HOSPITAL_COMMUNITY): Payer: Medicaid Other

## 2015-05-13 DIAGNOSIS — F79 Unspecified intellectual disabilities: Secondary | ICD-10-CM | POA: Diagnosis not present

## 2015-05-13 DIAGNOSIS — Z862 Personal history of diseases of the blood and blood-forming organs and certain disorders involving the immune mechanism: Secondary | ICD-10-CM | POA: Diagnosis not present

## 2015-05-13 DIAGNOSIS — G4730 Sleep apnea, unspecified: Secondary | ICD-10-CM | POA: Diagnosis not present

## 2015-05-13 DIAGNOSIS — Z8679 Personal history of other diseases of the circulatory system: Secondary | ICD-10-CM | POA: Diagnosis not present

## 2015-05-13 DIAGNOSIS — G40909 Epilepsy, unspecified, not intractable, without status epilepticus: Secondary | ICD-10-CM | POA: Insufficient documentation

## 2015-05-13 DIAGNOSIS — R569 Unspecified convulsions: Secondary | ICD-10-CM

## 2015-05-13 DIAGNOSIS — Z79899 Other long term (current) drug therapy: Secondary | ICD-10-CM | POA: Diagnosis not present

## 2015-05-13 DIAGNOSIS — Z9981 Dependence on supplemental oxygen: Secondary | ICD-10-CM | POA: Insufficient documentation

## 2015-05-13 DIAGNOSIS — E785 Hyperlipidemia, unspecified: Secondary | ICD-10-CM | POA: Diagnosis not present

## 2015-05-13 DIAGNOSIS — E039 Hypothyroidism, unspecified: Secondary | ICD-10-CM | POA: Insufficient documentation

## 2015-05-13 LAB — COMPREHENSIVE METABOLIC PANEL
ALBUMIN: 3.5 g/dL (ref 3.5–5.0)
ALK PHOS: 83 U/L (ref 38–126)
ALT: 45 U/L (ref 17–63)
AST: 56 U/L — AB (ref 15–41)
Anion gap: 7 (ref 5–15)
BUN: 8 mg/dL (ref 6–20)
CALCIUM: 8.7 mg/dL — AB (ref 8.9–10.3)
CO2: 22 mmol/L (ref 22–32)
CREATININE: 1.01 mg/dL (ref 0.61–1.24)
Chloride: 108 mmol/L (ref 101–111)
GFR calc Af Amer: >60 mL/min (ref >60)
GFR calc non Af Amer: >60 mL/min (ref >60)
GLUCOSE: 100 mg/dL — AB (ref 65–99)
Potassium: 4.7 mmol/L (ref 3.5–5.1)
SODIUM: 137 mmol/L (ref 135–145)
Total Bilirubin: 0.9 mg/dL (ref 0.3–1.2)
Total Protein: 7 g/dL (ref 6.5–8.1)

## 2015-05-13 LAB — CBC WITH DIFFERENTIAL/PLATELET
BASOS PCT: 0 % (ref 0–1)
Basophils Absolute: 0 10*3/uL (ref 0.0–0.1)
EOS ABS: 0.3 10*3/uL (ref 0.0–0.7)
Eosinophils Relative: 7 % — ABNORMAL HIGH (ref 0–5)
HCT: 41 % (ref 39.0–52.0)
HEMOGLOBIN: 13.3 g/dL (ref 13.0–17.0)
LYMPHS ABS: 1.9 10*3/uL (ref 0.7–4.0)
Lymphocytes Relative: 40 % (ref 12–46)
MCH: 28.1 pg (ref 26.0–34.0)
MCHC: 32.4 g/dL (ref 30.0–36.0)
MCV: 86.5 fL (ref 78.0–100.0)
Monocytes Absolute: 0.4 10*3/uL (ref 0.1–1.0)
Monocytes Relative: 8 % (ref 3–12)
NEUTROS PCT: 45 % (ref 43–77)
Neutro Abs: 2.1 10*3/uL (ref 1.7–7.7)
Platelets: 221 10*3/uL (ref 150–400)
RBC: 4.74 MIL/uL (ref 4.22–5.81)
RDW: 13.6 % (ref 11.5–15.5)
WBC: 4.6 10*3/uL (ref 4.0–10.5)

## 2015-05-13 LAB — I-STAT TROPONIN, ED: Troponin i, poc: 0 ng/mL (ref 0.00–0.08)

## 2015-05-13 LAB — CARBAMAZEPINE LEVEL, TOTAL: Carbamazepine Lvl: 13 ug/mL — ABNORMAL HIGH (ref 4.0–12.0)

## 2015-05-13 MED ORDER — SODIUM CHLORIDE 0.9 % IV BOLUS (SEPSIS)
1000.0000 mL | Freq: Once | INTRAVENOUS | Status: AC
  Administered 2015-05-13: 1000 mL via INTRAVENOUS

## 2015-05-13 MED ORDER — ACETAMINOPHEN 325 MG PO TABS
650.0000 mg | ORAL_TABLET | Freq: Once | ORAL | Status: AC
  Administered 2015-05-13: 650 mg via ORAL
  Filled 2015-05-13: qty 2

## 2015-05-13 NOTE — ED Provider Notes (Signed)
CSN: [id number]     Arrival date & time 05/13/15  1005 History   First MD Initiated Contact with Patient 05/13/15 1008     Chief Complaint  Patient presents with  . Seizures     (Consider location/radiation/quality/duration/timing/severity/associated sxs/prior Treatment) The history is provided by the patient and a parent.  [name] is a 29 y.o. male hx of mental retardation, genetic disorder, generalized seizure disorder, partial seizure as well as somatosensory seizure, here presenting with another seizure. Patient is currently on Tegretol, Keppra, Topamax, Vimpat, as needed Ativan and has been followed up with Dr. Gaynell Face. He had a seizure 2 weeks ago and this morning mom noticed that he had a tonic-clonic seizure and rolled off of the bed. Did not hit his head as per mother. His lips turned blue at the time and he was not breathing much so parents started chest compressions. Father did 10 chest compressions with several breaths and patient woke up and was confused. The parents did not check his pulses however. When EMS arrived, patient was post ictal and had pulses and was breathing on his own. Patient has extensive history of seizures and has been taking his medicines with no recent medication adjustments. Last seizure was 2 weeks ago and he is not having any vomiting or fevers. As per mother, he is back to his baseline.   Level V caveat- AMS    Past Medical History  Diagnosis Date  . ALLERGIC RHINITIS 12/26/2009  . MITRAL VALVE PROLAPSE 06/22/2007  . SEIZURE DISORDER 06/22/2007  . Unspecified hypothyroidism 06/22/2007  . Growth disorder   . Mental retardation, mild (I.Q. 50-70)   . Brown's syndrome   . Heart murmur     moderate MR  . Sleep apnea     cpap , last sleep study 10/01/11  . Hypokalemia   . Hyperlipidemia     No therapy  . Blood clot in vein    Past Surgical History  Procedure Laterality Date  . Implantation vagal nerve stimulator      Left  .  Portacath placement      and removal 04/08/12  . Eye surgery      X2 for Brown Syndrome   Family History  Problem Relation Age of Onset  . Breast cancer Mother   . Asthma Father   . Diabetes Father   . Coronary artery disease Maternal Grandfather   . Congestive Heart Failure Maternal Grandfather     Died at 42  . Pneumonia Maternal Grandfather   . Coronary artery disease Paternal Grandfather   . Lung cancer Paternal Grandfather     Died at 24  . Pneumonia Paternal Grandmother     Died at 64  . Stroke Paternal Grandmother    Social History  Substance Use Topics  . Smoking status: Never Smoker   . Smokeless tobacco: Never Used  . Alcohol Use: No    Review of Systems  Neurological: Positive for seizures.  All other systems reviewed and are negative.     Allergies  Antihistamines, chlorpheniramine-type; Divalproex sodium; Phenytoin; Valproic acid and related; and Vancomycin  Home Medications   Prior to Admission medications   Medication Sig Start Date End Date Taking? Authorizing Provider  carbamazepine (TEGRETOL) 100 MG chewable tablet CHEW AND SWALLOW TWO AND ONE-HALF TABLET THREE TIMES DAILY 03/29/15  Yes Rockwell Germany, NP  cetirizine (ZYRTEC) 10 MG tablet Take 10 mg by mouth daily. 02/12/15  Yes Historical Provider, MD  diazepam (VALIUM) 2  MG tablet Take 2 mg by mouth every 6 (six) hours as needed (for seizures).   Yes Historical Provider, MD  guaifenesin (HUMIBID E) 400 MG TABS tablet Take 400 mg by mouth 2 (two) times daily.   Yes Historical Provider, MD  KEPPRA 500 MG tablet Take 3 tablets twice daily 05/17/14  Yes Jodi Geralds, MD  levothyroxine (SYNTHROID, LEVOTHROID) 25 MCG tablet Take 25 mcg by mouth daily before breakfast.   Yes Historical Provider, MD  LORazepam (ATIVAN) 2 MG/ML concentrated solution Withdraw 31ml (2mg ) and 54ml  Normal saline into 3 ml syringe. Administer IV push over 3-5 minutes as needed for recurrent seizures 09/13/14  Yes Jodi Geralds, MD  montelukast (SINGULAIR) 10 MG tablet Take 10 mg by mouth at bedtime. 12/28/14 12/28/15 Yes Historical Provider, MD  Multiple Vitamin (MULTIVITAMIN WITH MINERALS) TABS Take 1 tablet by mouth daily.   Yes Historical Provider, MD  ondansetron (ZOFRAN-ODT) 4 MG disintegrating tablet Take 1 tablet as needed for nausea and vomiting 04/17/15  Yes Jodi Geralds, MD  [name] 10 MG tablet TAKE ONE-HALF TABLET BY MOUTH THREE TIMES DAILY 01/22/15  Yes Rockwell Germany, NP  PHENObarbital (LUMINAL) 65 MG/ML injection Withdraw 62ml (65mg ) Phenobarbital and 23ml Normal Saline into 76ml syringe. Adminster IV push for 3-5 minutes as needed for recurrent seizures 09/13/14  Yes Jodi Geralds, MD  potassium chloride SA (KLOR-CON M20) 20 MEQ tablet TAKE ONE TABLET BY MOUTH 4 TIMES DAILY. 12/14/14  Yes Jodi Geralds, MD  potassium phosphate, monobasic, (K-PHOS ORIGINAL) 500 MG tablet Take 1 tablet (500 mg total) by mouth 4 (four) times daily. 12/14/14  Yes Jodi Geralds, MD  rosuvastatin (CRESTOR) 10 MG tablet Take 10 mg by mouth at bedtime. 03/13/15 03/12/16 Yes Historical Provider, MD  Topiramate ER (TROKENDI XR) 50 MG CP24 Take 1 capsule by mouth at bedtime.   Yes Historical Provider, MD  TROKENDI XR 200 MG CP24 TAKE ONE CAPSULE BY MOUTH AT BEDTIME 04/23/15  Yes Rockwell Germany, NP  VIMPAT 50 MG TABS tablet TAKE ONE TABLET BY MOUTH IN THE MORNING,ONE TABLET IN THE MID DAY,AND 2 TABLETS AT BEDTIME 12/06/14  Yes Rockwell Germany, NP   BP 104/62 mmHg  Pulse 88  Temp(Src) 98 F (36.7 C) (Oral)  Resp 22  SpO2 100% Physical Exam  Constitutional:  Chronically ill, NAD   HENT:  Head: Normocephalic.  Mouth/Throat: Oropharynx is clear and moist.  Eyes: Conjunctivae and EOM are normal. Pupils are equal, round, and reactive to light.  No eye deviations   Neck: Normal range of motion. Neck supple.  Cardiovascular: Normal rate, regular rhythm and normal heart sounds.   Pulmonary/Chest: Effort normal and  breath sounds normal. No respiratory distress. He has no wheezes. He has no rales.  Abdominal: Soft. Bowel sounds are normal. He exhibits no distension. There is no tenderness. There is no rebound.  Musculoskeletal: Normal range of motion. He exhibits no edema or tenderness.  Neurological: He is alert.  Arms contracted (baseline), nl strength throughout. CN 2-12 intact. Awake, alert.   Skin: Skin is warm and dry.  Psychiatric: He has a normal mood and affect. His behavior is normal. Judgment and thought content normal.  Nursing note and vitals reviewed.   ED Course  Procedures (including critical care time) Labs Review Labs Reviewed  CBC WITH DIFFERENTIAL/PLATELET - Abnormal; Notable for the following:    Eosinophils Relative 7 (*)    All other components within normal limits  COMPREHENSIVE METABOLIC PANEL -  Abnormal; Notable for the following:    Glucose, Bld 100 (*)    Calcium 8.7 (*)    AST 56 (*)    All other components within normal limits  CARBAMAZEPINE LEVEL, TOTAL - Abnormal; Notable for the following:    Carbamazepine Lvl 13.0 (*)    All other components within normal limits  TOPIRAMATE LEVEL  I-STAT TROPOININ, ED    Imaging Review Dg Chest 2 View  05/13/2015   CLINICAL DATA:  Chest compressions with subsequent pain.  EXAM: CHEST  2 VIEW  COMPARISON:  05/12/2014  FINDINGS: Heart size is normal. Mediastinal shadows are normal. Power port placed from a right subclavian approach has its tip in the SVC just above the right atrium. Left-sided nerve stimulator appears unchanged. There is mild scarring of both lung bases. No visible rib fracture.  IMPRESSION: No acute finding. No visible rib fracture. Mild scarring at the lung bases.   Electronically Signed   By: Nelson Chimes M.D.   On: 05/13/2015 13:53   Dg Sternum  05/13/2015   CLINICAL DATA:  Status post chest compression with acute pain.  EXAM: STERNUM - 2+ VIEW  COMPARISON:  Chest radiography same day.  FINDINGS: Lateral view  of the sternum does not show a fracture. Central portion of the sternum is obscured by the neurostimulator and port.  IMPRESSION: No sternal fracture seen.  Central portion obscured.   Electronically Signed   By: Nelson Chimes M.D.   On: 05/13/2015 13:54   I have personally reviewed and evaluated these images and lab results as part of my medical decision-making.   EKG Interpretation   Date/Time:  Sunday May 13 2015 10:25:29 EDT Ventricular Rate:  92 PR Interval:  180 QRS Duration: 95 QT Interval:  352 QTC Calculation: 435 R Axis:   56 Text Interpretation:  Sinus rhythm No significant change since last  tracing Confirmed by YAO  MD, DAVID ([id number]) on 05/13/2015 10:38:00 AM      MDM   Final diagnoses:  None   Kristoph Sattler Gusler Perez is a 29 y.o. male here with seizure, now back to baseline. Parents did chest compressions briefly but I don't think he had cardiac arrest. I think he was hypoxic from the seizure at that time. Will get labs, drug levels, EKG. Will consult Dr. Gaynell Face.   12 PM Patient alert, awake. Tegretol level slightly elevated. Called Dr. Gaynell Face. He knows patient very well. Patient has been compliant. Felt that patient can be dc home if his mental status back to baseline. Tegretol level not a trough level so expected to be slightly elevated. Recommend continue current meds. Father wanted xray to r/o rib fracture.   2:12 PM Xray showed no rib fracture. No further seizures observed in the ED. Will dc home.   Wandra Arthurs, MD 05/13/15 [phone]

## 2015-05-13 NOTE — ED Notes (Signed)
Pt transported to xray 

## 2015-05-13 NOTE — ED Notes (Signed)
Pt remains monitored by blood pressure, pulse ox, and 12 lead. pts family remains at bedside.  

## 2015-05-13 NOTE — ED Notes (Signed)
Pt placed on monitor upon return to room from radiology.pt remains monitored by blood pressure, pulse ox, and 5 lead.  pts family remains at bedside.

## 2015-05-13 NOTE — ED Notes (Addendum)
Per EMS - Extensive seizure hx. Pt had 1-5min seizure this morning. Seizure lasted longer/seemed more severe than normal. Takes multiple seizure meds. [place] out of bed w/ seizure, no injury from fall. Pt became cyanotic - family did compressions and mouth-to-mouth, but no one had checked to see if he had a pulse before starting. Postictal when EMS arrived, came around after 5 min. CBG 131, BP 103/70.

## 2015-05-13 NOTE — Discharge Instructions (Signed)
Continue current seizure medications.  Stay hydrated.  See Dr. Gaynell Face next week.   Return to ER if you have another seizure, fevers, vomiting.

## 2015-05-14 LAB — TOPIRAMATE LEVEL: Topiramate Lvl: 7.2 ug/mL (ref 2.0–25.0)

## 2015-05-15 ENCOUNTER — Telehealth: Payer: Self-pay | Admitting: *Deleted

## 2015-05-15 NOTE — Telephone Encounter (Signed)
Mom called and states that she knows the hospital called to talk to you about [name] and would like to talk to you further about this. She would like to know if he needs to be seen sooner or keep his follow up appt that has already been made and if there are any medication changes that may be needed. She states that this is all pretty much the same as the incident a year ago.  CB #: [id number]

## 2015-05-15 NOTE — Telephone Encounter (Signed)
I left a voicemail message.  I don't think that he needs to be seen sooner normal to change medication.  I invited mother to call back she still has questions.  I agree with her that this is similar to episodes that he had a year ago.

## 2015-05-25 ENCOUNTER — Other Ambulatory Visit: Payer: Self-pay | Admitting: Pediatrics

## 2015-05-26 IMAGING — CR DG CHEST 2V
1 series · 1 of 1 positions shown · non-contrast
Comparison: Chest radiograph August 02, 2013

CLINICAL DATA: Seizure, persisting cough.

EXAM:
CHEST  2 VIEW

[view not recorded]
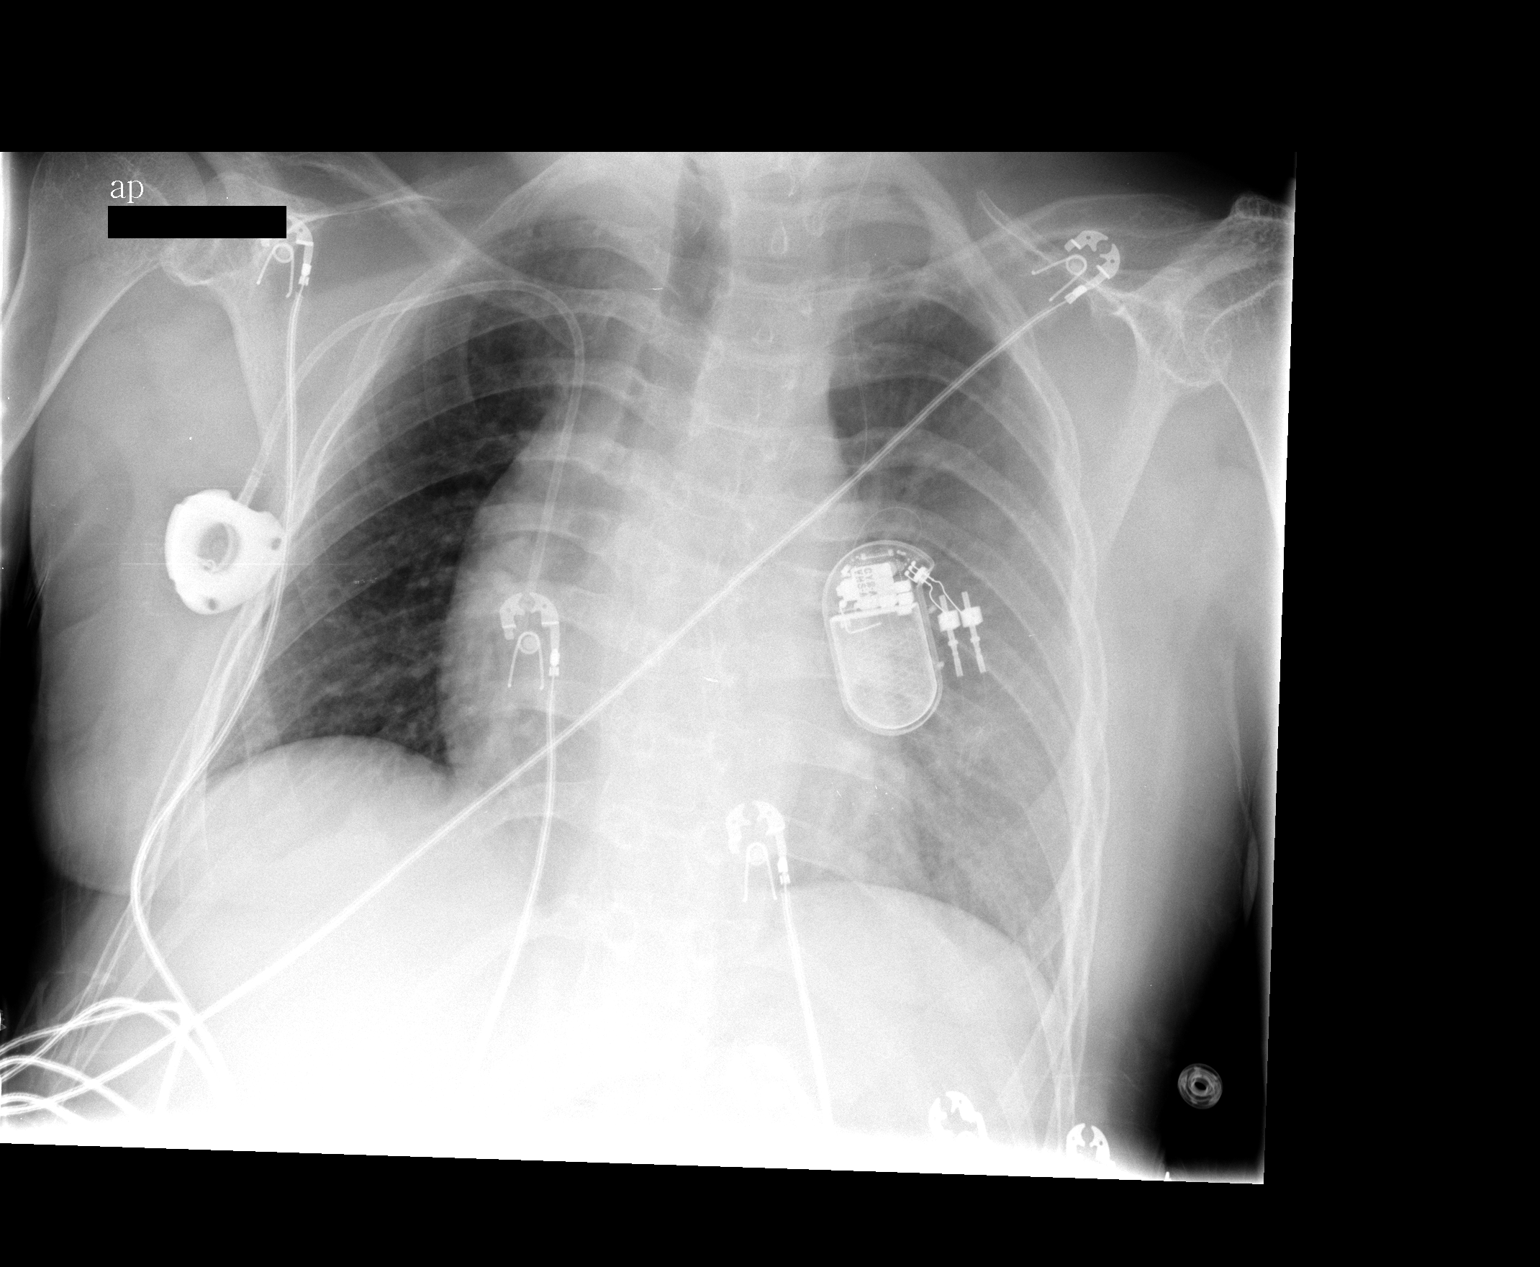

[1 of 1 positions shown; findings below may reference images not displayed]

FINDINGS: Patient is rotated to the right. The cardiac silhouette appears
upper limits of normal in size, similar. Mediastinal silhouette is
nonsuspicious. Hazy density in left lung, slightly increased.
Similar biapical pleural thickening. Vagal stimulator left chest.
Single lumen to [name] right chest with distal tip at
cavoatrial junction. No pneumothorax.

Patient is mildly osteopenic.
IMPRESSION: Hazy density in left lung, which could be projectional as patient is
rotated to the right. No focal consolidation.

Borderline cardiomegaly.

  By: Eirikur Groom

## 2015-06-07 ENCOUNTER — Other Ambulatory Visit: Payer: Self-pay | Admitting: Pediatrics

## 2015-06-10 ENCOUNTER — Other Ambulatory Visit: Payer: Self-pay | Admitting: Family

## 2015-06-26 ENCOUNTER — Emergency Department (HOSPITAL_COMMUNITY)
Admission: EM | Admit: 2015-06-26 | Discharge: 2015-06-26 | Disposition: A | Discharge status: Home / Self Care | Payer: Medicaid Other | Attending: Emergency Medicine | Admitting: Emergency Medicine

## 2015-06-26 ENCOUNTER — Telehealth: Payer: Self-pay

## 2015-06-26 ENCOUNTER — Encounter (HOSPITAL_COMMUNITY): Payer: Self-pay | Admitting: *Deleted

## 2015-06-26 ENCOUNTER — Telehealth: Payer: Self-pay | Admitting: Family

## 2015-06-26 DIAGNOSIS — Z79899 Other long term (current) drug therapy: Secondary | ICD-10-CM | POA: Diagnosis not present

## 2015-06-26 DIAGNOSIS — E785 Hyperlipidemia, unspecified: Secondary | ICD-10-CM | POA: Diagnosis not present

## 2015-06-26 DIAGNOSIS — R011 Cardiac murmur, unspecified: Secondary | ICD-10-CM | POA: Diagnosis not present

## 2015-06-26 DIAGNOSIS — G40109 Localization-related (focal) (partial) symptomatic epilepsy and epileptic syndromes with simple partial seizures, not intractable, without status epilepticus: Secondary | ICD-10-CM

## 2015-06-26 DIAGNOSIS — G40319 Generalized idiopathic epilepsy and epileptic syndromes, intractable, without status epilepticus: Secondary | ICD-10-CM

## 2015-06-26 DIAGNOSIS — E039 Hypothyroidism, unspecified: Secondary | ICD-10-CM | POA: Insufficient documentation

## 2015-06-26 DIAGNOSIS — G4730 Sleep apnea, unspecified: Secondary | ICD-10-CM | POA: Diagnosis not present

## 2015-06-26 DIAGNOSIS — G40909 Epilepsy, unspecified, not intractable, without status epilepticus: Secondary | ICD-10-CM | POA: Insufficient documentation

## 2015-06-26 DIAGNOSIS — G43809 Other migraine, not intractable, without status migrainosus: Secondary | ICD-10-CM | POA: Diagnosis not present

## 2015-06-26 DIAGNOSIS — Z9981 Dependence on supplemental oxygen: Secondary | ICD-10-CM | POA: Insufficient documentation

## 2015-06-26 DIAGNOSIS — G40309 Generalized idiopathic epilepsy and epileptic syndromes, not intractable, without status epilepticus: Secondary | ICD-10-CM

## 2015-06-26 DIAGNOSIS — E876 Hypokalemia: Secondary | ICD-10-CM | POA: Diagnosis not present

## 2015-06-26 DIAGNOSIS — R112 Nausea with vomiting, unspecified: Secondary | ICD-10-CM

## 2015-06-26 DIAGNOSIS — G40401 Other generalized epilepsy and epileptic syndromes, not intractable, with status epilepticus: Secondary | ICD-10-CM

## 2015-06-26 DIAGNOSIS — G40209 Localization-related (focal) (partial) symptomatic epilepsy and epileptic syndromes with complex partial seizures, not intractable, without status epilepticus: Secondary | ICD-10-CM

## 2015-06-26 DIAGNOSIS — G40901 Epilepsy, unspecified, not intractable, with status epilepticus: Secondary | ICD-10-CM

## 2015-06-26 DIAGNOSIS — G40119 Localization-related (focal) (partial) symptomatic epilepsy and epileptic syndromes with simple partial seizures, intractable, without status epilepticus: Secondary | ICD-10-CM

## 2015-06-26 LAB — CARBAMAZEPINE LEVEL, TOTAL: Carbamazepine Lvl: 9.7 ug/mL (ref 4.0–12.0)

## 2015-06-26 LAB — CBG MONITORING, ED: GLUCOSE-CAPILLARY: 97 mg/dL (ref 65–99)

## 2015-06-26 MED ORDER — ONDANSETRON HCL 4 MG/2ML IJ SOLN
4.0000 mg | Freq: Once | INTRAMUSCULAR | Status: AC
  Administered 2015-06-26: 4 mg via INTRAVENOUS
  Filled 2015-06-26: qty 2

## 2015-06-26 MED ORDER — SODIUM CHLORIDE 0.9 % IV SOLN
50.0000 mg | Freq: Two times a day (BID) | INTRAVENOUS | Status: DC
  Administered 2015-06-26: 50 mg via INTRAVENOUS
  Filled 2015-06-26 (×2): qty 5

## 2015-06-26 MED ORDER — SODIUM CHLORIDE 0.9 % IV BOLUS (SEPSIS)
1000.0000 mL | Freq: Once | INTRAVENOUS | Status: AC
  Administered 2015-06-26: 1000 mL via INTRAVENOUS

## 2015-06-26 MED ORDER — METOCLOPRAMIDE HCL 5 MG/ML IJ SOLN
10.0000 mg | Freq: Once | INTRAMUSCULAR | Status: AC
  Administered 2015-06-26: 10 mg via INTRAVENOUS
  Filled 2015-06-26: qty 2

## 2015-06-26 MED ORDER — METOCLOPRAMIDE HCL 10 MG PO TABS: 10.0000 mg | ORAL_TABLET | Freq: Four times a day (QID) | ORAL | Status: DC | PRN

## 2015-06-26 MED ORDER — LORAZEPAM 2 MG/ML PO CONC: ORAL | Status: DC

## 2015-06-26 MED ORDER — SODIUM CHLORIDE 0.9 % IV SOLN
500.0000 mg | Freq: Once | INTRAVENOUS | Status: AC
  Administered 2015-06-26: 500 mg via INTRAVENOUS
  Filled 2015-06-26: qty 5

## 2015-06-26 MED ORDER — PHENOBARBITAL SODIUM 65 MG/ML IJ SOLN: INTRAMUSCULAR | Status: DC

## 2015-06-26 NOTE — Telephone Encounter (Signed)
I reviewed your note and agree with this plan. 

## 2015-06-26 NOTE — Telephone Encounter (Signed)
Mom DJ Arriaga called with concern about [name]. She said that he awakened at 1:30 AM with migraine. She gave him Tylenol and Zofran, and he vomited an hour or so later. He has remained nauseated and vomited intermittently, and has not been able to keep take his seizure medication. Mom tried giving him Zofran again this AM, in hopes of being able to get his seizure meds in, but he vomited the dose. The migraine continues without relief. I talked to Mom and told her that [name] needs to be seen in the ER since the migraine has not improved, he has been unable to take medication or drink liquids and he has not been able to take any seizure meds. Mom agreed and will take him to ER now. TG

## 2015-06-26 NOTE — Telephone Encounter (Signed)
Jeani Hawking, pharmacist from [employer], lvm requesting refills for pt's Lorazepam and Phenobarbital.

## 2015-06-26 NOTE — ED Provider Notes (Signed)
Care assumed from [facility] PA-C at shift change. Pt here with migraine, same as prior migraines. Hx of seizures and unable to keep PO down at home therefore family brought him in. Plan is to give pt his vimpat and keppra, and reglan/zofran/fluids to treat migraine, then if tolerating PO well pt can be discharged home, if not he will need to be admitted to neurohospitalist service.   Physical Exam  BP 111/76 mmHg  Pulse 85  Temp(Src) 97.8 F (36.6 C)  Resp 16  SpO2 97%  Physical Exam Gen: afebrile, VSS, NAD HEENT: EOMI, MMM Resp: CTAB in all lung fields, no w/r/r, no hypoxia or increased WOB CV: RRR, nl s1/s2, no m/r/g Abd: Soft, NTND, +BS throughout, no r/g/r MsK: moving all extremities with ease Neuro: A&O  ED Course  Procedures Results for orders placed or performed during the hospital encounter of 06/26/15  Carbamazepine (Tegretol) Level  (if patient is taking this medication)  Result Value Ref Range   Carbamazepine Lvl 9.7 4.0 - 12.0 ug/mL  CBG monitoring, ED  Result Value Ref Range   Glucose-Capillary 97 65 - 99 mg/dL   No results found.   Meds ordered this encounter  Medications  . ondansetron (ZOFRAN) injection 4 mg    Sig:   . sodium chloride 0.9 % bolus 1,000 mL    Sig:   . acetaminophen (TYLENOL) 500 MG tablet    Sig: Take 1,000 mg by mouth every 6 (six) hours as needed for headache.  . metoCLOPramide (REGLAN) injection 10 mg    Sig:   . levETIRAcetam (KEPPRA) 500 mg in sodium chloride 0.9 % 100 mL IVPB    Sig:   . lacosamide (VIMPAT) 50 mg in sodium chloride 0.9 % 25 mL IVPB    Sig:   . metoCLOPramide (REGLAN) 10 MG tablet    Sig: Take 1 tablet (10 mg total) by mouth every 6 (six) hours as needed for nausea (nausea/headache).    Dispense:  20 tablet    Refill:  0    Order Specific Question:  Supervising Provider    Answer:  Noemi Chapel [[id number]]     MDM:   ICD-9-CM ICD-10-CM   1. Other migraine without status migrainosus, not intractable 346.80  G43.809   2. Seizure disorder ([place]) 345.90 G40.909   3. Non-intractable vomiting with nausea, unspecified vomiting type 787.01 R11.2     4:13 PM Pt just got his meds ~29mins ago. States nausea is resolved, and migraine is improving. Will attempt PO challenge.   5:19 PM Tolerating PO, migraine gone. Will d/c home with reglan for home use, and discussed use of home meds for his n/v/migraines. F/up with PCP in 5-7 days. I explained the diagnosis and have given explicit precautions to return to the ER including for any other new or worsening symptoms. The patient understands and accepts the medical plan as it's been dictated and I have answered their questions. Discharge instructions concerning home care and prescriptions have been given. The patient is STABLE and is discharged to home in good condition.  BP 100/66 mmHg  Pulse 79  Temp(Src) 97.8 F (36.6 C)  Resp 16  SpO2 95%   Tabitha Tupper Camprubi-Soms, PA-C 06/26/15 [place], MD 06/27/15 0930

## 2015-06-26 NOTE — Discharge Instructions (Signed)
Use home zofran as needed for nausea, and reglan as needed for headache and nausea. Use tylenol or motrin as needed for headaches. Follow up with your regular doctor in 5-7 days for recheck. Return to the ER for changes or worsening symptoms.   Migraine Headache A migraine headache is very bad, throbbing pain on one or both sides of your head. Talk to your doctor about what things may bring on (trigger) your migraine headaches. HOME CARE  Only take medicines as told by your doctor.  Lie down in a dark, quiet room when you have a migraine.  Keep a journal to find out if certain things bring on migraine headaches. For example, write down:  What you eat and drink.  How much sleep you get.  Any change to your diet or medicines.  Lessen how much alcohol you drink.  Quit smoking if you smoke.  Get enough sleep.  Lessen any stress in your life.  Keep lights dim if bright lights bother you or make your migraines worse. GET HELP RIGHT AWAY IF:   Your migraine becomes really bad.  You have a fever.  You have a stiff neck.  You have trouble seeing.  Your muscles are weak, or you lose muscle control.  You lose your balance or have trouble walking.  You feel like you will pass out (faint), or you pass out.  You have really bad symptoms that are different than your first symptoms. MAKE SURE YOU:   Understand these instructions.  Will watch your condition.  Will get help right away if you are not doing well or get worse. Document Released: 06/17/2008 Document Revised: 12/01/2011 Document Reviewed: 05/16/2013 [facility] Patient Information 2015 [place], [place]. This information is not intended to replace advice given to you by your health care provider. Make sure you discuss any questions you have with your health care provider.  Nausea and Vomiting Nausea means you feel sick to your stomach. Throwing up (vomiting) is a reflex where stomach contents come out of your mouth. HOME  CARE   Take medicine as told by your doctor.  Do not force yourself to eat. However, you do need to drink fluids.  If you feel like eating, eat a normal diet as told by your doctor.  Eat rice, wheat, potatoes, bread, lean meats, yogurt, fruits, and vegetables.  Avoid high-fat foods.  Drink enough fluids to keep your pee (urine) clear or pale yellow.  Ask your doctor how to replace body fluid losses (rehydrate). Signs of body fluid loss (dehydration) include:  Feeling very thirsty.  Dry lips and mouth.  Feeling dizzy.  Dark pee.  Peeing less than normal.  Feeling confused.  Fast breathing or heart rate. GET HELP RIGHT AWAY IF:   You have blood in your throw up.  You have black or bloody poop (stool).  You have a bad headache or stiff neck.  You feel confused.  You have bad belly (abdominal) pain.  You have chest pain or trouble breathing.  You do not pee at least once every 8 hours.  You have cold, clammy skin.  You keep throwing up after 24 to 48 hours.  You have a fever. MAKE SURE YOU:   Understand these instructions.  Will watch your condition.  Will get help right away if you are not doing well or get worse.   This information is not intended to replace advice given to you by your health care provider. Make sure you discuss any questions you  have with your health care provider.   Document Released: 02/25/2008 Document Revised: 12/01/2011 Document Reviewed: 02/07/2011 Elsevier Interactive Patient Education [employer].

## 2015-06-26 NOTE — Telephone Encounter (Signed)
I called and verified fax number to send orders. I will fax Rx's now. TG

## 2015-06-26 NOTE — ED Notes (Signed)
Pt woke up with migraine at 0130 and got tylenol and zofran.  Then started vomiting again at 0300 and then 0800.  Past week end he had seizures and family accessed his portacath to give phenobarbital.  Family brought pt in with concerns to have seizures d/t to inability to keep anything down.  This is normal reaction to having migraines and denies fever.  Pt is alert and acting appropriate for mom.

## 2015-06-26 NOTE — ED Provider Notes (Signed)
CSN: [id number]     Arrival date & time 06/26/15  1320 History   First MD Initiated Contact with Patient 06/26/15 1410     CC: migraine, vomiting   (Consider location/radiation/quality/duration/timing/severity/associated sxs/prior Treatment) HPI Comments: Patient with history of seizure disorder on Tegretol, Keppra, [name], Vimpat, Trokendi -- presents with complaint of atypical migraine headache with vomiting which started early this morning. Pain is all over his head. No associated fever or neck pain. No head injury. Positive photophobia. Patient has had many similar headaches in the past per the parents. They treated at home with Tylenol without relief. Due to the frequent vomiting, patient has been unable to keep down any of his anti-epileptic medications today. Parents spoke with the patient's neurologist, Dr. Gaynell Face, and were told to go the emergency department for further evaluation. No other medical complaints. Child seizures are typically tonic in nature.  The history is provided by the patient, medical records and a caregiver.    Past Medical History  Diagnosis Date  . ALLERGIC RHINITIS 12/26/2009  . MITRAL VALVE PROLAPSE 06/22/2007  . SEIZURE DISORDER 06/22/2007  . Unspecified hypothyroidism 06/22/2007  . Growth disorder   . Mental retardation, mild (I.Q. 50-70)   . Brown's syndrome   . Heart murmur     moderate MR  . Sleep apnea     cpap , last sleep study 10/01/11  . Hypokalemia   . Hyperlipidemia     No therapy  . Blood clot in vein    Past Surgical History  Procedure Laterality Date  . Implantation vagal nerve stimulator      Left  . Portacath placement      and removal 04/08/12  . Eye surgery      X2 for Brown Syndrome   Family History  Problem Relation Age of Onset  . Breast cancer Mother   . Asthma Father   . Diabetes Father   . Coronary artery disease Maternal Grandfather   . Congestive Heart Failure Maternal Grandfather     Died at 1  . Pneumonia Maternal  Grandfather   . Coronary artery disease Paternal Grandfather   . Lung cancer Paternal Grandfather     Died at 31  . Pneumonia Paternal Grandmother     Died at 72  . Stroke Paternal Grandmother    Social History  Substance Use Topics  . Smoking status: Never Smoker   . Smokeless tobacco: Never Used  . Alcohol Use: No    Review of Systems  Constitutional: Negative for fever.  HENT: Negative for congestion, dental problem, rhinorrhea and sinus pressure.   Eyes: Positive for photophobia. Negative for discharge, redness and visual disturbance.  Respiratory: Negative for shortness of breath.   Cardiovascular: Negative for chest pain.  Gastrointestinal: Positive for nausea and vomiting. Negative for abdominal pain.  Musculoskeletal: Negative for gait problem, neck pain and neck stiffness.  Skin: Negative for rash.  Neurological: Positive for headaches. Negative for syncope, speech difficulty, weakness, light-headedness and numbness.  Psychiatric/Behavioral: Negative for confusion.    Allergies  Antihistamines, chlorpheniramine-type; Divalproex sodium; Phenytoin; Valproic acid and related; and Vancomycin  Home Medications   Prior to Admission medications   Medication Sig Start Date End Date Taking? Authorizing Provider  carbamazepine (TEGRETOL) 100 MG chewable tablet CHEW AND SWALLOW TWO AND ONE-HALF TABLET THREE TIMES DAILY 03/29/15   Rockwell Germany, NP  cetirizine (ZYRTEC) 10 MG tablet Take 10 mg by mouth daily. 02/12/15   Historical Provider, MD  diazepam (VALIUM) 2 MG tablet  Take 2 mg by mouth every 6 (six) hours as needed (for seizures).    Historical Provider, MD  guaifenesin (HUMIBID E) 400 MG TABS tablet Take 400 mg by mouth 2 (two) times daily.    Historical Provider, MD  KEPPRA 500 MG tablet TAKE THREE TABLETS BY MOUTH TWICE DAILY 05/25/15   Rockwell Germany, NP  levothyroxine (SYNTHROID, LEVOTHROID) 25 MCG tablet Take 25 mcg by mouth daily before breakfast.    Historical  Provider, MD  LORazepam (ATIVAN) 2 MG/ML concentrated solution Withdraw 27ml (2mg ) and 51ml  Normal saline into 3 ml syringe. Administer IV push over 3-5 minutes as needed for recurrent seizures 06/26/15   Rockwell Germany, NP  montelukast (SINGULAIR) 10 MG tablet Take 10 mg by mouth at bedtime. 12/28/14 12/28/15  Historical Provider, MD  Multiple Vitamin (MULTIVITAMIN WITH MINERALS) TABS Take 1 tablet by mouth daily.    Historical Provider, MD  ondansetron (ZOFRAN-ODT) 4 MG disintegrating tablet Take 1 tablet as needed for nausea and vomiting 04/17/15   Jodi Geralds, MD  [name] 10 MG tablet TAKE ONE-HALF TABLET BY MOUTH THREE TIMES DAILY 01/22/15   Rockwell Germany, NP  PHENObarbital (LUMINAL) 65 MG/ML injection Withdraw 80ml (65mg ) Phenobarbital and 14ml Normal Saline into 35ml syringe. [place] IV push for 3-5 minutes as needed for recurrent seizures 06/26/15   Rockwell Germany, NP  potassium chloride SA (KLOR-CON M20) 20 MEQ tablet TAKE ONE TABLET BY MOUTH 4 TIMES DAILY. 12/14/14   Jodi Geralds, MD  potassium phosphate, monobasic, (K-PHOS ORIGINAL) 500 MG tablet Take 1 tablet (500 mg total) by mouth 4 (four) times daily. 12/14/14   Jodi Geralds, MD  rosuvastatin (CRESTOR) 10 MG tablet Take 10 mg by mouth at bedtime. 03/13/15 03/12/16  Historical Provider, MD  TROKENDI XR 200 MG CP24 TAKE ONE CAPSULE BY MOUTH AT BEDTIME 04/23/15   Rockwell Germany, NP  TROKENDI XR 50 MG CP24 TAKE TWO CAPSULES BY MOUTH AT BEDTIME 06/07/15   Rockwell Germany, NP  VIMPAT 50 MG TABS tablet TAKE 1 TABLET BY MOUTH IN THE MORNING, 1 TABLET IN THE MID-DAY, AND 2 TABLETS AT BEDTIME 06/11/15   Jodi Geralds, MD   BP 111/76 mmHg  Pulse 85  Temp(Src) 97.8 F (36.6 C)  Resp 16  SpO2 97%   Physical Exam  Constitutional: He is oriented to person, place, and time. He appears well-developed and well-nourished.  HENT:  Head: Normocephalic and atraumatic.  Right Ear: Tympanic membrane, external ear and ear canal normal.   Left Ear: Tympanic membrane, external ear and ear canal normal.  Nose: Nose normal.  Mouth/Throat: Uvula is midline, oropharynx is clear and moist and mucous membranes are normal.  Eyes: Conjunctivae, EOM and lids are normal. Pupils are equal, round, and reactive to light.  Neck: Normal range of motion. Neck supple.  Cardiovascular: Normal rate and regular rhythm.   Pulmonary/Chest: Effort normal and breath sounds normal.  Abdominal: Soft. There is no tenderness.  Musculoskeletal: Normal range of motion.       Cervical back: He exhibits normal range of motion, no tenderness and no bony tenderness.  Neurological: He is alert and oriented to person, place, and time. He has normal strength and normal reflexes. No cranial nerve deficit or sensory deficit. He exhibits normal muscle tone. Coordination normal. GCS eye subscore is 4. GCS verbal subscore is 5. GCS motor subscore is 6.  Skin: Skin is warm and dry.  Psychiatric: He has a normal mood and affect.  Nursing note  and vitals reviewed.   ED Course  Procedures (including critical care time) Labs Review Labs Reviewed  CARBAMAZEPINE LEVEL, TOTAL  CBG MONITORING, ED    Imaging Review No results found. I have personally reviewed and evaluated these images and lab results as part of my medical decision-making.   EKG Interpretation None       2:25 PM Patient seen and examined. Work-up initiated. Medications ordered.   Vital signs reviewed and are as follows: BP 111/76 mmHg  Pulse 85  Temp(Src) 97.8 F (36.6 C)  Resp 16  SpO2 97%  3:37 PM I spoke with Dr. Gaynell Face shortly after arrival for reccs.   4:00 PM Handoff to Camprubi-Soms PA-C at shift change. Plan: treat HA, give IV Vimpat/Keppra. If improved and tolerating orals can go home, otherwise admit to neurohospitalist.   MDM   Final diagnoses:  Other migraine without status migrainosus, not intractable  Seizure disorder ([place])   Pending symptom control and dispo.     Carlisle Cater, PA-C 06/26/15 [place], MD 06/27/15 [phone]

## 2015-07-13 ENCOUNTER — Telehealth: Payer: Self-pay

## 2015-07-13 DIAGNOSIS — E876 Hypokalemia: Secondary | ICD-10-CM

## 2015-07-13 MED ORDER — POTASSIUM PHOSPHATE MONOBASIC 500 MG PO TABS: 500.0000 mg | ORAL_TABLET | Freq: Four times a day (QID) | ORAL | Status: DC

## 2015-07-13 NOTE — Telephone Encounter (Signed)
Done, thanks

## 2015-07-13 NOTE — Telephone Encounter (Signed)
Doris , mom, lvm requesting Rx with refills for child's generic K-Phos 500 mg.She asked that it be sent to their new pharmacy, [facility] in [place]. She asked that we make sure that we change child's pharmacy in his chart form [facility] to [facility]. I have made the pharmacy change as requested.

## 2015-07-22 ENCOUNTER — Other Ambulatory Visit: Payer: Self-pay | Admitting: Family

## 2015-07-23 ENCOUNTER — Other Ambulatory Visit: Payer: Self-pay | Admitting: Family

## 2015-08-24 ENCOUNTER — Other Ambulatory Visit: Payer: Self-pay | Admitting: Family

## 2015-09-08 ENCOUNTER — Other Ambulatory Visit: Payer: Self-pay | Admitting: Pediatrics

## 2015-09-21 ENCOUNTER — Encounter: Payer: Self-pay | Admitting: Cardiovascular Disease

## 2015-09-21 ENCOUNTER — Ambulatory Visit (INDEPENDENT_AMBULATORY_CARE_PROVIDER_SITE_OTHER): Payer: Medicaid Other | Admitting: Cardiovascular Disease

## 2015-09-21 VITALS — BP 110/66 | HR 87 | Resp 12 | Ht 62.5 in | Wt 119.6 lb

## 2015-09-21 DIAGNOSIS — I340 Nonrheumatic mitral (valve) insufficiency: Secondary | ICD-10-CM

## 2015-09-21 NOTE — Progress Notes (Signed)
Chief Complaint  Patient presents with  . Mitral Regurgitation  . 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 He was initially seen as a new consult for his atrial fibrillation by Dr. Jolyn Nap on 04/08/12. He was admitted on 04/08/12 to get a [name] replacement. They removed the old [name] but were not able to insert a new one because of right-sided central venous occlusion. During wire manipulatoin, he developed rapid atrial fibrillation.  In PACU, the atrial fibrillation resolved without medical intervention. At the time of evaluation, he was maintaining sinus rhythm and had no recurrence during the hospital stay. He has frequent seizures. Last echo 05/20/13 with normal LV function, moderate mitral regurgitation. I saw him August 2015 after his Neurologist saw him for frequent seizures requiring CPR due to apnea, unresponsiveness. There was the question of possible associated arrythmias. 3 week event monitor with normal sinus rhythm. No events.   He is here today for follow up. He feels well. No chest pain or SOB. No awareness of palpitations.   Primary Care Physician: Anastasia Pall  Past Medical History  Diagnosis Date  . ALLERGIC RHINITIS 12/26/2009  . MITRAL VALVE PROLAPSE 06/22/2007  . SEIZURE DISORDER 06/22/2007  . Unspecified hypothyroidism 06/22/2007  . Growth disorder   . Mental retardation, mild (I.Q. 50-70)   . Brown's syndrome   . Heart murmur     moderate MR  . Sleep apnea     cpap , last sleep study 10/01/11  . Hypokalemia   . Hyperlipidemia     No therapy  . Blood clot in vein     Past Surgical History  Procedure Laterality Date  . Implantation vagal nerve stimulator      Left  . Portacath placement      and removal  04/08/12  . Eye surgery      X2 for [facility] Syndrome    Current Outpatient Prescriptions  Medication Sig Dispense Refill  . acetaminophen (TYLENOL) 500 MG tablet Take 1,000 mg by mouth every 6 (six) hours as needed for headache.    . carbamazepine (TEGRETOL) 100 MG chewable tablet CHEW AND SWALLOW TWO AND ONE-HALF TABLET THREE TIMES DAILY 233 tablet 5  . cetirizine (ZYRTEC) 10 MG tablet Take 10 mg by mouth daily.    . diazepam (VALIUM) 2 MG tablet Take 2 mg by mouth every 6 (six) hours as needed (for seizures).    Marland Kitchen guaifenesin (HUMIBID E) 400 MG TABS tablet Take 400 mg by mouth 2 (two) times daily.    Marland Kitchen KEPPRA 500 MG tablet TAKE THREE TABLETS BY MOUTH TWICE DAILY 186 tablet 5  . KLOR-CON M20 20 MEQ tablet TAKE 1 TABLET BY MOUTH 4 TIMES A DAY 124 tablet 5  . levothyroxine (SYNTHROID, LEVOTHROID) 25 MCG tablet Take 25 mcg by mouth daily before breakfast.    . LORazepam (ATIVAN) 2 MG/ML concentrated solution Withdraw 32ml (2mg ) and 73ml  Normal saline into 3 ml syringe. Administer IV push over 3-5 minutes as needed for recurrent seizures 5 mL 5  . metoCLOPramide (REGLAN) 10 MG tablet Take 1 tablet (10 mg total) by mouth every 6 (six) hours as needed for nausea (nausea/headache). 20 tablet 0  . montelukast (SINGULAIR) 10 MG tablet Take 10 mg by mouth at bedtime.    Marland Kitchen  Multiple Vitamin (MULTIVITAMIN WITH MINERALS) TABS Take 1 tablet by mouth daily.    . ondansetron (ZOFRAN-ODT) 4 MG disintegrating tablet Take 1 tablet as needed for nausea and vomiting 20 tablet 0  . [name] 10 MG tablet TAKE 1/2 TABLET BY MOUTH 3 TIMES A DAY 45 tablet 5  . PHENObarbital (LUMINAL) 65 MG/ML injection Withdraw 82ml (65mg ) Phenobarbital and 27ml Normal Saline into 82ml syringe. Adminster IV push for 3-5 minutes as needed for recurrent seizures 5 mL 5  . potassium phosphate, monobasic, (K-PHOS ORIGINAL) 500 MG tablet Take 1 tablet (500 mg total) by mouth 4 (four) times daily. 124 tablet 5  . rosuvastatin (CRESTOR) 10 MG tablet  Take 10 mg by mouth at bedtime.    Marland Kitchen TROKENDI XR 200 MG CP24 TAKE ONE CAPSULE BY MOUTH AT BEDTIME 30 capsule 1  . TROKENDI XR 50 MG CP24 TAKE TWO CAPSULES BY MOUTH AT BEDTIME 62 capsule 5  . VIMPAT 50 MG TABS tablet TAKE 1 TABLET BY MOUTH EVERY MORNING, TAKE 1 TABLET MIDDAY, AND TAKE 2 TABLETS AT BEDTIME 124 tablet 5   No current facility-administered medications for this visit.    Allergies  Allergen Reactions  . Antihistamines, Chlorpheniramine-Type Other (See Comments)    Other Reaction: Dimetapp causes seizures  . Divalproex Sodium Other (See Comments)    Nausea, vomiting, liver and kidney dysfunction  . Phenytoin Rash and Other (See Comments)    Dilantin causes more seizures.   . Valproic Acid And Related Other (See Comments)    Shuts down systems  . Vancomycin Rash and Other (See Comments)    Other Reaction: Redman's Syndrome    Social History   Social History  . Marital Status: Single    Spouse Name: N/A  . Number of Children: 0  . Years of Education: N/A   Occupational History  . Disabled    Social History Main Topics  . Smoking status: Never Smoker   . Smokeless tobacco: Never Used  . Alcohol Use: No  . Drug Use: No  . Sexual Activity: No   Other Topics Concern  . Not on file   Social History Narrative    Family History  Problem Relation Age of Onset  . Breast cancer Mother   . Asthma Father   . Diabetes Father   . Coronary artery disease Maternal Grandfather   . Congestive Heart Failure Maternal Grandfather     Died at 24  . Pneumonia Maternal Grandfather   . Coronary artery disease Paternal Grandfather   . Lung cancer Paternal Grandfather     Died at 38  . Pneumonia Paternal Grandmother     Died at 62  . Stroke Paternal Grandmother     Review of Systems:  As stated in the HPI and otherwise negative.   BP 110/66 mmHg  Pulse 87  Resp 12  Ht 5' 2.5" (1.588 m)  Wt 119 lb 9.6 oz (54.25 kg)  BMI 21.51 kg/m2  SpO2 94%  Physical  Examination: General: Well developed, well nourished, NAD HEENT: OP clear, mucus membranes moist SKIN: warm, dry. No rashes. Neuro: No focal deficits Musculoskeletal: Muscle strength 5/5 all ext Psychiatric: Mood and affect normal Neck: No JVD, no carotid bruits, no thyromegaly, no lymphadenopathy. Lungs:Clear bilaterally, no wheezes, rhonci, crackles Cardiovascular: Regular rate and rhythm. Slight systolic  Murmur. No gallops or rubs. Abdomen:Soft. Bowel sounds present. Non-tender.  Extremities: No lower extremity edema. Pulses are 2 + in the bilateral DP/PT.  Echo 05/20/13: Left ventricle: The  cavity size was mildly dilated. Wall thickness was normal. Systolic function was normal. The estimated ejection fraction was in the range of 60% to 65%. Wall motion was normal; there were no regional wall motion abnormalities. - Mitral valve: Moderate regurgitation.  EKG:  EKG is not ordered today. The ekg ordered today demonstrates   Recent Labs: 05/13/2015: ALT 45; BUN 8; Creatinine, Ser 1.01; Hemoglobin 13.3; Platelets 221; Potassium 4.7; Sodium 137   Lipid Panel No results found for: CHOL, TRIG, HDL, CHOLHDL, VLDL, LDLCALC, LDLDIRECT   Wt Readings from Last 3 Encounters:  09/21/15 119 lb 9.6 oz (54.25 kg)  04/17/15 114 lb (51.71 kg)  12/14/14 112 lb 3.2 oz (50.894 kg)     Other studies Reviewed: Additional studies/ records that were reviewed today include:  Review of the above records demonstrates:    Assessment and Plan:   1. Syncope/apnea following seizures: No clear evidence of cardiac arrythmias. Telemetry in hospital without arrythmias. Event monitor with sinus rhythm, no arrythmias. I suspect that this is all driven by his seizures. Unclear if he is pulseless or just apneic during these events.   2. Atrial fibrillation: One episode during a procedure in July 2013. Likely induced by the wire. No recurrence. He is in normal sinus rhythm today. No further workup or treatment.   He is on coumadin for upper extremity DVT.    3. Mitral regurgitation: Moderate MR by echo August 2014. Repeat echo now.   Current medicines are reviewed at length with the patient today.  The patient does not have concerns regarding medicines.  The following changes have been made:  no change  Labs/ tests ordered today include:   Orders Placed This Encounter  Procedures  . Echocardiogram    Disposition:   FU with me in 12  months  Signed, Lauree Chandler, MD 09/21/2015 1:37 PM    [facility] [address], [place], [place]  [address] Phone: [phone]; Fax: [phone]

## 2015-09-21 NOTE — Patient Instructions (Addendum)
Medication Instructions:  Your physician recommends that you continue on your current medications as directed. Please refer to the Current Medication list given to you today.   Labwork: none  Testing/Procedures: Your physician has requested that you have an echocardiogram. Echocardiography is a painless test that uses sound waves to create images of your heart. It provides your doctor with information about the size and shape of your heart and how well your heart's chambers and valves are working. This procedure takes approximately one hour. There are no restrictions for this procedure.    Follow-Up: Your physician wants you to follow-up in: 12 months.  You will receive a reminder letter in the mail two months in advance. If you don't receive a letter, please call our office to schedule the follow-up appointment.   Any Other Special Instructions Will Be Listed Below (If Applicable).     If you need a refill on your cardiac medications before your next appointment, please call your pharmacy.   

## 2015-09-27 ENCOUNTER — Encounter: Payer: Self-pay | Admitting: Pediatrics

## 2015-09-27 ENCOUNTER — Ambulatory Visit (INDEPENDENT_AMBULATORY_CARE_PROVIDER_SITE_OTHER): Payer: Medicaid Other | Admitting: Pediatrics

## 2015-09-27 VITALS — BP 100/62 | HR 76 | Ht 63.25 in | Wt 117.2 lb

## 2015-09-27 DIAGNOSIS — G40319 Generalized idiopathic epilepsy and epileptic syndromes, intractable, without status epilepticus: Secondary | ICD-10-CM

## 2015-09-27 DIAGNOSIS — R112 Nausea with vomiting, unspecified: Secondary | ICD-10-CM | POA: Diagnosis not present

## 2015-09-27 DIAGNOSIS — R269 Unspecified abnormalities of gait and mobility: Secondary | ICD-10-CM

## 2015-09-27 DIAGNOSIS — F70 Mild intellectual disabilities: Secondary | ICD-10-CM

## 2015-09-27 MED ORDER — TROKENDI XR 50 MG PO CP24: 2.0000 | ORAL_CAPSULE | Freq: Every day | ORAL | Status: DC

## 2015-09-27 MED ORDER — TOPIRAMATE ER 200 MG PO CAP24: 1.0000 | ORAL_CAPSULE | Freq: Every day | ORAL | Status: DC

## 2015-09-27 MED ORDER — ONDANSETRON 4 MG PO TBDP: ORAL_TABLET | ORAL | Status: DC

## 2015-09-27 NOTE — Progress Notes (Signed)
Patient: [name] MRN: [id number] Sex: male DOB: [birth year]  Provider: Jodi Geralds, MD Location of Care: [facility]  Note type: Routine return visit  History of Present Illness: Referral Source: Irena Reichmann, MD History from: mother, patient and [facility] chart Chief Complaint: VNS Check/ Seizures  [name] is a 30 y.o. male who returns September 27, 2015, for the first time since April 17, 2015.  [name] has a complicated neurologic disorder that involves generalized tonic-clonic seizures, postictal migraines, early onset of cataracts, intellectual disability, mitral regurgitation, and an MRI scan that shows significant calcifications in his basal ganglia.  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  His mother tells me that he has one to three seizures every three weeks.  Almost all appear during the daytime.  One occurred May 13, 2015, when he had an early morning seizure and fell out of bed.  He appeared cyanotic and apneic.  His mother and father initiated CPR without checking his pulse and after 10 compressions and some breaths he seemed to come around though he was postictal.  He was evaluated in the Emergency Department.  Chest x-ray was unremarkable as was EKG.  His carbamazepine level was 13.  He had one other Emergency Department evaluation on October 4th, when he presented with headaches and intractable vomiting.  He had a series of seizures that required access of his [name], which is not very common.  I was contacted by his parents and recommended that he be evaluated in the Emergency Department.  His vomiting ceased, he was able to tolerate liquids and his medications and he was able to be discharged home.  He received Reglan, Zofran, and fluids.  They treated his migrainous symptoms and  nausea.  His typical seizures are associated with stiffening without clonic activity and apnea, the total duration of which is about a minute and a half.  He is postictal for 5 to 8 minutes.  He has not had somatosensory seizures in quite some time and no complex partial seizures.  On two or three occasions, his vagal nerve stimulator has interrupted the seizure.  There is no pattern to that.  He goes to bed around 10 p.m.  He tends to sleep soundly.  He has a good appetite.  He has not had any increased swelling in his face from his superior vena cava syndrome.  He was here today for management of his seizures and also had his vagal nerve stimulator interrogated.  This is described below.  Procedure: Interrogation and reprogramming of Vagal Nerve Stimulator  Implantation of the current stimulator 104 serial # [id number] implanted 04/19/14  Interrogation of the vagal nerve stimulator: Voltage 2.25 mA. Frequency 20 Hz, pulse width 250 microseconds, signal time on 7 seconds, signal time off 1.1 minutes, magnet current 2.50 mA, magnet signal time on 60 seconds, magnet pulse width 250 microseconds. I reprogrammed the computer for diagnostics.  The normal mode diagnostics using his current settings revealed the battery is nearly full, and communication, output, and impedance are good; 1744 ohms. 119 stimuli since implantation, 35 since December 14, 2014.  There is no indication change the battery.  He tolerated the procedure well.  Review of Systems: 12 system review was unremarkable  Past Medical History Diagnosis Date  . ALLERGIC RHINITIS 12/26/2009  . MITRAL VALVE PROLAPSE 06/22/2007  .  SEIZURE DISORDER 06/22/2007  . Unspecified hypothyroidism 06/22/2007  . Growth disorder   . Mental retardation, mild (I.Q. 50-70)   . Brown's syndrome   . Heart murmur     moderate MR  . Sleep apnea     cpap , last sleep study 10/01/11  . Hypokalemia   . Hyperlipidemia     No therapy  . Blood clot in vein     Hospitalizations: Yes.  , Head Injury: No., Nervous System Infections: No., Immunizations up to date: Yes.    Diagnosis of developmental delay as a toddler.   EEG 11/07/86 was normal for gestational age.   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  Initial seizure 08/28/94 left locomotor with secondary generalization.   MRI of the brain 09/13/93: Scattered subcortical white matter lesions at the parietotemporal parieto-occipital junction is ischemic versus hamartomas.   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   Diagnosis attention deficit disorder inattentive type treated without success with Cylert April 1995  MRI brain 04/04/94 stable differential diagnosis hamartoma, vasculitis, ischemic, or embolic phenomenon.   status epilepticus 01/31/95, and 04/15/95 Neurontin added to Tegretol   MRI brain 06/14/96 unchanged   admission to [facility]. Tegretol 30 mcg for militate. 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   Topiramate started in May 1999 unable to be tolerated with Tegretol and was tapered and discontinued.  January 2000 EEG showed right greater than left mid temporal sharp waves was otherwise well-organized EEG. He hospitalizations in March, July, October, in November for recurrent seizures.  Patient placed on Depakote in April 2000 and developed gagging abdominal pain headache and had significant change in transaminases and liver  functions. Topamax was restarted and Keppra was added.  Vimpat was started July 16, 2012 and adjusted upwards.   [name] was added on November 18, 2012 and has been adjusted upwards. Topamax was changed to Trokendi XR in attempt to deal with sleepiness unsteadiness caused by polypharmacy. We to these medications have been introduced, the patient experiences somewhat improved seizure control however Is quite likely that despite better seizure control, the patient is experiencing impairment in cognition and gait as a result of polypharmacy .  MRI of the brain on March 21, 2013. The patient has extensive calcification to the basal ganglia bilaterally, normal ventricle size, and no acute findings. EKG was unchanged  Birth History He was a breech presentation, cesarean section delivery. He had intrauterine growth retardation, and failure to thrive.   Behavior History none  Surgical History Procedure Laterality Date  . Implantation vagal nerve stimulator      Left  . Portacath placement      and removal 04/08/12  . Eye surgery      X2 for Brown Syndrome   Family History family history includes Asthma in his father; Breast cancer in his mother; Congestive Heart Failure in his maternal grandfather; Coronary artery disease in his maternal grandfather and paternal grandfather; Diabetes in his father; Lung cancer in his paternal grandfather; Pneumonia in his maternal grandfather and paternal  grandmother; Stroke in his paternal grandmother. Family history is negative for migraines, seizures, intellectual disabilities, blindness, deafness, birth defects, chromosomal disorder, or autism.  Social History . Marital Status: Single    Spouse Name: N/A  . Number of Children: 0  . Years of Education: N/A   Occupational History  . Disabled    Social History Main Topics  . Smoking status: Never Smoker   . Smokeless tobacco: Never Used  . Alcohol Use: No  . Drug Use: No  . 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 He lives with his parents. He enjoys helping around the house, Nascar, and football.   Allergies Allergen Reactions  . Antihistamines, Chlorpheniramine-Type Other (See Comments)    Other Reaction: Dimetapp causes seizures  . Divalproex Sodium Other (See Comments)    Nausea, vomiting, liver and kidney dysfunction  . Phenytoin Rash and Other (See Comments)    Dilantin causes more seizures.   . Valproic Acid And Related Other (See Comments)    Shuts down systems  . Vancomycin Rash and Other (See Comments)    Other Reaction: Redman's Syndrome   Physical Exam BP 100/62 mmHg  Pulse 76  Ht 5' 3.25" (1.607 m)  Wt 117 lb 3.2 oz (53.162 kg)  BMI 20.59 kg/m2  General: thin young man, seated on the examination table, in no distress, right-handed, sandy hair, blue/brown eyes; Awake, alert, animated, talkative.  Head: head microcephalic and atraumatic, His head is slightly turned with the right ear toward his right shoulder and chin points the left. He does not have torticollis. Left face and neck are less swollen than last visit  Ears, Nose and Throat: beak-like nose. He has a low lying palate. Wax occluding external auditory canals  Neck: supple with no carotid or supraclavicular bruits.  Respiratory: Lungs are clear to auscultation  Cardiovascular: regular rate and rhythm, soft holosystolic murmur at left sternal border. His nailbeds are pink.  Musculoskeletal: short stature with head tilted to the right ear. He has tight heel cords.  Skin: no rashes or lesions.  Trunk: no scoliosis. His portacath in the right upper chest. He has a vagal nerve stimulator in the left upper chest.  Neurologic Exam  Mental Status: Awake, alert. oriented to place. Attention span, concentration, and fund of knowledge are below average. He has evidence of mild intellectual  disability. Mood and affect appropriate. 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 Pupils equal, briskly reactive to light. Extraocular movements full but dysconjugate. Visual fields full to confrontation. Hearing intact to air conduction greater than bone conduction. Facial sensation intact. Face, tongue, palate move normally and symmetrically. Neck flexion and extension normal. 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 He had a slight tic-like movement of his mouth.  Motor: Diminished bulk and normal tone. The patient has diffuse mild weakness in his proximal and distal muscles in the 4-4+ over 5 range.He had normal strength in his legs. He has clumsy fine motor movements. His hands are semiflexed at the distal joints at rest but he can extend them.  Sensory: Intact to light touch,cold, vibration, and proprioception in all extremities; good stereognosis  Coordination: Rapid alternating movements are clumsy in all extremities. Finger-to-nose and heel-to-shin performed accurately bilaterally. Romberg negative.  Gait and Station: Right  foot is in a somewhat valgus position, but the heel strike for both feet is good. His gait is slightly broad-based and was ataxic Reflexes: Absent and symmetric. Toes downgoing.  Assessment 1. 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 2. Mild intellectual disability, F70. 3. Abnormality of gait, R26.9. 4. Non-intractable vomiting with nausea of unspecified type, R11.2.  Discussion [name] is doing well.  His seizures are not completely controlled, but he had only 2 emergency department visits because of seizures in the past six months.  The vagal nerve stimulator is working well and on occasion has aborted some of his seizures.  He only has generalized tonic seizures at this time.  I'm aware of having to access his  [name] only once because of recurrent seizures.  There have been no somatosensory seizures.  I do not know if this is related to his combination of medications, his vagal nerve stimulator or both.  Plan I refilled prescriptions for ondansetron, and both doses of Trokendi.  He will return to see me in six months' time.  I will see him sooner based on clinical need.  I spent 30 minutes of face-to-face time with [name] and his mother.  In addition I interrogated his vagal nerve stimulator, but did not need to reprogram it.   Medication List   This list is accurate as of: 09/27/15 11:59 PM.       acetaminophen 500 MG tablet  Commonly known as:  TYLENOL  Take 1,000 mg by mouth every 6 (six) hours as needed for headache.     carbamazepine 100 MG chewable tablet  Commonly known as:  TEGRETOL  CHEW AND SWALLOW TWO AND ONE-HALF TABLET THREE TIMES DAILY     cetirizine 10 MG tablet  Commonly known as:  ZYRTEC  Take 10 mg by mouth daily.     CRESTOR 10 MG tablet  Generic drug:  rosuvastatin  Take 10 mg by mouth at bedtime.     diazepam 2 MG tablet  Commonly known as:  VALIUM  Take 2 mg by mouth every 6 (six) hours as needed (for seizures).     guaifenesin 400 MG Tabs tablet  Commonly known as:  HUMIBID E  Take 400 mg by mouth 2 (two) times daily.     KEPPRA 500 MG tablet  Generic drug:  levETIRAcetam  TAKE THREE TABLETS BY MOUTH TWICE DAILY     KLOR-CON M20 20 MEQ tablet  Generic drug:  potassium chloride SA  TAKE 1 TABLET BY MOUTH 4 TIMES A DAY     levothyroxine 25 MCG tablet  Commonly known as:  SYNTHROID, LEVOTHROID  Take 25 mcg by mouth daily before breakfast.     LORazepam 2 MG/ML concentrated solution  Commonly known as:  ATIVAN  Withdraw 62ml (2mg ) and 14ml  Normal saline into 3 ml syringe. Administer IV push over 3-5 minutes as needed for recurrent seizures     metoCLOPramide 10 MG tablet  Commonly known as:  REGLAN  Take 1 tablet (10 mg total) by mouth every 6 (six)  hours as needed for nausea (nausea/headache).     multivitamin with minerals Tabs tablet  Take 1 tablet by mouth daily.     ondansetron 4 MG disintegrating tablet  Commonly known as:  ZOFRAN-ODT  Take 1 tablet as needed for nausea and vomiting     [name] 10 MG tablet  Generic drug:  cloBAZam  TAKE 1/2 TABLET BY MOUTH 3 TIMES A DAY     PHENObarbital 65  MG/ML injection  Commonly known as:  LUMINAL  Withdraw 73ml (65mg ) Phenobarbital and 41ml Normal Saline into 4ml syringe. Adminster IV push for 3-5 minutes as needed for recurrent seizures     potassium phosphate (monobasic) 500 MG tablet  Commonly known as:  K-PHOS ORIGINAL  Take 1 tablet (500 mg total) by mouth 4 (four) times daily.     SINGULAIR 10 MG tablet  Generic drug:  montelukast  Take 10 mg by mouth at bedtime.     Topiramate ER 200 MG Cp24  Commonly known as:  TROKENDI XR  Take 1 capsule by mouth at bedtime.     TROKENDI XR 50 MG Cp24  Generic drug:  Topiramate ER  Take 2 capsules by mouth at bedtime.     VIMPAT 50 MG Tabs tablet  Generic drug:  lacosamide  TAKE 1 TABLET BY MOUTH EVERY MORNING, TAKE 1 TABLET MIDDAY, AND TAKE 2 TABLETS AT BEDTIME      The medication list was reviewed and reconciled. All changes or newly prescribed medications were explained.  A complete medication list was provided to the patient/caregiver.  Jodi Geralds MD

## 2015-09-29 ENCOUNTER — Observation Stay (HOSPITAL_COMMUNITY)
Admission: EM | Admit: 2015-09-29 | Discharge: 2015-09-30 | Disposition: A | Discharge status: Home / Self Care | Payer: Medicaid Other | Attending: Internal Medicine | Admitting: Internal Medicine

## 2015-09-29 ENCOUNTER — Encounter (HOSPITAL_COMMUNITY): Payer: Self-pay

## 2015-09-29 ENCOUNTER — Emergency Department (HOSPITAL_COMMUNITY): Payer: Medicaid Other

## 2015-09-29 DIAGNOSIS — F70 Mild intellectual disabilities: Secondary | ICD-10-CM | POA: Diagnosis not present

## 2015-09-29 DIAGNOSIS — R092 Respiratory arrest: Secondary | ICD-10-CM | POA: Insufficient documentation

## 2015-09-29 DIAGNOSIS — G4733 Obstructive sleep apnea (adult) (pediatric): Secondary | ICD-10-CM | POA: Diagnosis not present

## 2015-09-29 DIAGNOSIS — E039 Hypothyroidism, unspecified: Secondary | ICD-10-CM | POA: Diagnosis not present

## 2015-09-29 DIAGNOSIS — G40909 Epilepsy, unspecified, not intractable, without status epilepticus: Secondary | ICD-10-CM | POA: Diagnosis not present

## 2015-09-29 DIAGNOSIS — Z79899 Other long term (current) drug therapy: Secondary | ICD-10-CM | POA: Diagnosis not present

## 2015-09-29 DIAGNOSIS — G40419 Other generalized epilepsy and epileptic syndromes, intractable, without status epilepticus: Principal | ICD-10-CM | POA: Insufficient documentation

## 2015-09-29 DIAGNOSIS — R569 Unspecified convulsions: Secondary | ICD-10-CM

## 2015-09-29 DIAGNOSIS — I469 Cardiac arrest, cause unspecified: Secondary | ICD-10-CM | POA: Diagnosis not present

## 2015-09-29 LAB — URINALYSIS, ROUTINE W REFLEX MICROSCOPIC
Bilirubin Urine: NEGATIVE
GLUCOSE, UA: 250 mg/dL — AB
KETONES UR: NEGATIVE mg/dL
Leukocytes, UA: NEGATIVE
Nitrite: NEGATIVE
PROTEIN: 100 mg/dL — AB
Specific Gravity, Urine: 1.016 (ref 1.005–1.030)
pH: 6.5 (ref 5.0–8.0)

## 2015-09-29 LAB — BASIC METABOLIC PANEL
ANION GAP: 9 (ref 5–15)
BUN: 8 mg/dL (ref 6–20)
CHLORIDE: 108 mmol/L (ref 101–111)
CO2: 23 mmol/L (ref 22–32)
Calcium: 9 mg/dL (ref 8.9–10.3)
Creatinine, Ser: 0.93 mg/dL (ref 0.61–1.24)
GFR calc non Af Amer: >60 mL/min (ref >60)
Glucose, Bld: 102 mg/dL — ABNORMAL HIGH (ref 65–99)
POTASSIUM: 4.1 mmol/L (ref 3.5–5.1)
SODIUM: 140 mmol/L (ref 135–145)

## 2015-09-29 LAB — CBC WITH DIFFERENTIAL/PLATELET
BASOS PCT: 0 %
Basophils Absolute: 0 10*3/uL (ref 0.0–0.1)
EOS ABS: 0.1 10*3/uL (ref 0.0–0.7)
Eosinophils Relative: 2 %
HEMATOCRIT: 43 % (ref 39.0–52.0)
HEMOGLOBIN: 14 g/dL (ref 13.0–17.0)
Lymphocytes Relative: 17 %
Lymphs Abs: 1.3 10*3/uL (ref 0.7–4.0)
MCH: 28.5 pg (ref 26.0–34.0)
MCHC: 32.6 g/dL (ref 30.0–36.0)
MCV: 87.4 fL (ref 78.0–100.0)
MONOS PCT: 9 %
Monocytes Absolute: 0.7 10*3/uL (ref 0.1–1.0)
NEUTROS ABS: 5.7 10*3/uL (ref 1.7–7.7)
NEUTROS PCT: 72 %
Platelets: 240 10*3/uL (ref 150–400)
RBC: 4.92 MIL/uL (ref 4.22–5.81)
RDW: 13.5 % (ref 11.5–15.5)
WBC: 7.8 10*3/uL (ref 4.0–10.5)

## 2015-09-29 LAB — CARBAMAZEPINE LEVEL, TOTAL: CARBAMAZEPINE LVL: 12.9 ug/mL — AB (ref 4.0–12.0)

## 2015-09-29 LAB — I-STAT CG4 LACTIC ACID, ED: Lactic Acid, Venous: 1.26 mmol/L (ref 0.5–2.0)

## 2015-09-29 LAB — RAPID URINE DRUG SCREEN, HOSP PERFORMED
Amphetamines: NOT DETECTED
BARBITURATES: NOT DETECTED
BENZODIAZEPINES: POSITIVE — AB
Cocaine: NOT DETECTED
Opiates: NOT DETECTED
TETRAHYDROCANNABINOL: NOT DETECTED

## 2015-09-29 LAB — I-STAT CHEM 8, ED
BUN: 9 mg/dL (ref 6–20)
CHLORIDE: 107 mmol/L (ref 101–111)
CREATININE: 0.8 mg/dL (ref 0.61–1.24)
Calcium, Ion: 1.19 mmol/L (ref 1.12–1.23)
Glucose, Bld: 93 mg/dL (ref 65–99)
HEMATOCRIT: 48 % (ref 39.0–52.0)
Hemoglobin: 16.3 g/dL (ref 13.0–17.0)
POTASSIUM: 3.9 mmol/L (ref 3.5–5.1)
SODIUM: 142 mmol/L (ref 135–145)
TCO2: 22 mmol/L (ref 0–100)

## 2015-09-29 LAB — I-STAT TROPONIN, ED: TROPONIN I, POC: 0.03 ng/mL (ref 0.00–0.08)

## 2015-09-29 LAB — URINE MICROSCOPIC-ADD ON

## 2015-09-29 LAB — PHENOBARBITAL LEVEL: Phenobarbital: <5 ug/mL — ABNORMAL LOW (ref 15.0–40.0)

## 2015-09-29 LAB — CBG MONITORING, ED: Glucose-Capillary: 84 mg/dL (ref 65–99)

## 2015-09-29 MED ORDER — POTASSIUM CHLORIDE CRYS ER 20 MEQ PO TBCR
20.0000 meq | EXTENDED_RELEASE_TABLET | Freq: Four times a day (QID) | ORAL | Status: DC
  Administered 2015-09-29 – 2015-09-30 (×4): 20 meq via ORAL
  Filled 2015-09-29 (×4): qty 1

## 2015-09-29 MED ORDER — LEVOTHYROXINE SODIUM 25 MCG PO TABS
25.0000 ug | ORAL_TABLET | Freq: Every day | ORAL | Status: DC
  Administered 2015-09-30: 25 ug via ORAL
  Filled 2015-09-29: qty 1

## 2015-09-29 MED ORDER — MONTELUKAST SODIUM 10 MG PO TABS
10.0000 mg | ORAL_TABLET | Freq: Every day | ORAL | Status: DC
  Administered 2015-09-29: 10 mg via ORAL
  Filled 2015-09-29: qty 1

## 2015-09-29 MED ORDER — METOCLOPRAMIDE HCL 10 MG PO TABS: 10.0000 mg | ORAL_TABLET | Freq: Four times a day (QID) | ORAL | Status: DC | PRN

## 2015-09-29 MED ORDER — PHENOBARBITAL 32.4 MG PO TABS: 32.4000 mg | ORAL_TABLET | Freq: Every day | ORAL | Status: DC

## 2015-09-29 MED ORDER — CLOBAZAM 10 MG PO TABS
5.0000 mg | ORAL_TABLET | ORAL | Status: DC
  Administered 2015-09-29 – 2015-09-30 (×4): 5 mg via ORAL
  Filled 2015-09-29: qty 0.5

## 2015-09-29 MED ORDER — LACOSAMIDE 50 MG PO TABS
100.0000 mg | ORAL_TABLET | ORAL | Status: DC
  Administered 2015-09-29: 100 mg via ORAL
  Filled 2015-09-29: qty 2

## 2015-09-29 MED ORDER — LACOSAMIDE 50 MG PO TABS
50.0000 mg | ORAL_TABLET | ORAL | Status: DC
  Administered 2015-09-29 – 2015-09-30 (×3): 50 mg via ORAL
  Filled 2015-09-29 (×3): qty 1

## 2015-09-29 MED ORDER — SODIUM CHLORIDE 0.9 % IJ SOLN
3.0000 mL | Freq: Two times a day (BID) | INTRAMUSCULAR | Status: DC
  Administered 2015-09-29 – 2015-09-30 (×2): 3 mL via INTRAVENOUS

## 2015-09-29 MED ORDER — ONDANSETRON HCL 4 MG/2ML IJ SOLN
4.0000 mg | INTRAMUSCULAR | Status: AC
  Administered 2015-09-29: 4 mg via INTRAVENOUS
  Filled 2015-09-29: qty 2

## 2015-09-29 MED ORDER — LEVETIRACETAM 750 MG PO TABS
1500.0000 mg | ORAL_TABLET | Freq: Two times a day (BID) | ORAL | Status: DC
  Administered 2015-09-29 – 2015-09-30 (×2): 1500 mg via ORAL
  Filled 2015-09-29 (×2): qty 2

## 2015-09-29 MED ORDER — SODIUM CHLORIDE 0.9 % IV BOLUS (SEPSIS)
500.0000 mL | Freq: Once | INTRAVENOUS | Status: AC
  Administered 2015-09-29: 500 mL via INTRAVENOUS

## 2015-09-29 MED ORDER — ONDANSETRON HCL 4 MG/2ML IJ SOLN
4.0000 mg | Freq: Three times a day (TID) | INTRAMUSCULAR | Status: AC | PRN
  Administered 2015-09-29: 4 mg via INTRAVENOUS
  Filled 2015-09-29: qty 2

## 2015-09-29 MED ORDER — LORATADINE 10 MG PO TABS
10.0000 mg | ORAL_TABLET | Freq: Every day | ORAL | Status: DC
  Administered 2015-09-29 – 2015-09-30 (×2): 10 mg via ORAL
  Filled 2015-09-29 (×2): qty 1

## 2015-09-29 MED ORDER — ROSUVASTATIN CALCIUM 10 MG PO TABS
10.0000 mg | ORAL_TABLET | Freq: Every day | ORAL | Status: DC
  Administered 2015-09-29: 10 mg via ORAL
  Filled 2015-09-29: qty 1

## 2015-09-29 MED ORDER — TOPIRAMATE ER 50 MG PO CAP24
50.0000 mg | ORAL_CAPSULE | ORAL | Status: DC
  Administered 2015-09-29: 50 mg via ORAL
  Filled 2015-09-29: qty 1

## 2015-09-29 MED ORDER — PHENOBARBITAL 32.4 MG PO TABS
32.4000 mg | ORAL_TABLET | Freq: Every day | ORAL | Status: DC
  Administered 2015-09-29: 32.4 mg via ORAL
  Filled 2015-09-29: qty 1

## 2015-09-29 MED ORDER — SODIUM CHLORIDE 0.9 % IV SOLN
1500.0000 mg | Freq: Two times a day (BID) | INTRAVENOUS | Status: DC
  Filled 2015-09-29 (×2): qty 15

## 2015-09-29 MED ORDER — ACETAMINOPHEN 325 MG PO TABS: 650.0000 mg | ORAL_TABLET | Freq: Four times a day (QID) | ORAL | Status: DC | PRN

## 2015-09-29 MED ORDER — SODIUM CHLORIDE 0.9 % IV SOLN
1500.0000 mg | INTRAVENOUS | Status: DC
  Administered 2015-09-29: 1500 mg via INTRAVENOUS
  Filled 2015-09-29 (×2): qty 15

## 2015-09-29 MED ORDER — ACETAMINOPHEN 650 MG RE SUPP: 650.0000 mg | Freq: Four times a day (QID) | RECTAL | Status: DC | PRN

## 2015-09-29 MED ORDER — ENOXAPARIN SODIUM 40 MG/0.4ML ~~LOC~~ SOLN
40.0000 mg | SUBCUTANEOUS | Status: DC
  Administered 2015-09-29: 40 mg via SUBCUTANEOUS
  Filled 2015-09-29: qty 0.4

## 2015-09-29 MED ORDER — CARBAMAZEPINE 100 MG PO CHEW
250.0000 mg | CHEWABLE_TABLET | ORAL | Status: DC
  Administered 2015-09-29 – 2015-09-30 (×4): 250 mg via ORAL
  Filled 2015-09-29 (×8): qty 2.5

## 2015-09-29 MED ORDER — ADULT MULTIVITAMIN W/MINERALS CH
1.0000 | ORAL_TABLET | Freq: Every day | ORAL | Status: DC
  Administered 2015-09-29 – 2015-09-30 (×2): 1 via ORAL
  Filled 2015-09-29 (×2): qty 1

## 2015-09-29 MED ORDER — TOPIRAMATE ER 200 MG PO CAP24
200.0000 mg | ORAL_CAPSULE | ORAL | Status: DC
  Administered 2015-09-29: 200 mg via ORAL
  Filled 2015-09-29: qty 1

## 2015-09-29 NOTE — Progress Notes (Signed)
NURSING PROGRESS NOTE  [name] [id number] Admission Data: 09/29/2015 4:47 PM Attending Provider: Oswald Hillock, MD [id number] C, MD Code Status: full  Allergies:  Antihistamines, chlorpheniramine-type; Divalproex sodium; Phenytoin; Valproic acid and related; and Vancomycin Past Medical History:   has a past medical history of ALLERGIC RHINITIS (12/26/2009); MITRAL VALVE PROLAPSE (06/22/2007); SEIZURE DISORDER (06/22/2007); Unspecified hypothyroidism (06/22/2007); Growth disorder; Mental retardation, mild (I.Q. 50-70); Brown's syndrome; Heart murmur; Sleep apnea; Hypokalemia; Hyperlipidemia; and Blood clot in vein. Past Surgical History:   has past surgical history that includes Implantation vagal nerve stimulator; Portacath placement; and Eye surgery. Social History:   reports that he has never smoked. He has never used smokeless tobacco. He reports that he does not drink alcohol or use illicit drugs.  [name] is a 30 y.o. male patient admitted from ED:   Last Documented Vital Signs: Blood pressure 112/75, pulse 83, temperature 98 F (36.7 C), temperature source Oral, resp. rate 16, height 5\' 3"  (1.6 m), weight 53.978 kg (119 lb), SpO2 100 %.  Cardiac Monitoring: Box # 22 in place. Cardiac monitor yields:normal sinus rhythm.  IV Fluids:  IV in place, occlusive dsg intact without redness, IV cath hand left, condition patent and no redness IV push only, no IV fluids.   Skin: NSI  Patient/Family orientated to room. Information packet given to patient/family. Admission inpatient armband information verified with patient/family to include name and date of birth and placed on patient arm. Side rails up x 2, fall assessment and education completed with patient/family. Patient/family able to verbalize understanding of risk associated with falls and verbalized understanding to call for assistance before getting out of bed. Call light within reach. Patient/family able  to voice and demonstrate understanding of unit orientation instructions.    Will continue to evaluate and treat per MD orders.   Joslyn Hy, MSN, RN, [employer]

## 2015-09-29 NOTE — ED Notes (Signed)
Pt. BIB EMS for complaint of sz this AM lasting 5 min. Pt. Family witnessed event and states it was grand mal in nature. Pt. Family states that pt. Had period of apnea and pulselessness following sz and they began CPR for 2 min. When fire arrived they also had trouble locating a pulse and started CPR as well. No meds or shocks given. Pt. At baseline now. Pt. Does have hx of sz and is taking medications regularly.

## 2015-09-29 NOTE — H&P (Signed)
[facility] History and Physical                                                                                    [name], is a 30 y.o. male  MRN: [id number]   DOB - Apr 06, 1986  Admit Date - 09/29/2015  Outpatient Primary MD for the patient is Chesley Noon, MD  Referring MD: Audie Pinto / ER  Consulting MD : Silverio Decamp /neurology  PMH: Past Medical History  Diagnosis Date  . ALLERGIC RHINITIS 12/26/2009  . MITRAL VALVE PROLAPSE 06/22/2007  . SEIZURE DISORDER 06/22/2007  . Unspecified hypothyroidism 06/22/2007  . Growth disorder   . Mental retardation, mild (I.Q. 50-70)   . Brown's syndrome   . Heart murmur     moderate MR  . Sleep apnea     cpap , last sleep study 10/01/11  . Hypokalemia   . Hyperlipidemia     No therapy  . Blood clot in vein       PSH: Past Surgical History  Procedure Laterality Date  . Implantation vagal nerve stimulator      Left  . Portacath placement      and removal 04/08/12  . 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 She was sitting on the couch when he developed tonic-clonic activity lasting about 2 minutes. This was associated with significant apnea halting and apparent loss of pulse thus leading to CPR in the field. According to the patient's mother this is a 6 seizure and for years that has required CPR due to airway issues. He has been previously evaluated by cardiology regarding the syncope/apnea episodes following seizures and it has been documented that there is no clear evidence of cardiac arrhythmias. Telemetry during hospitalizations  have been without arrhythmia. That monitor was sinus rhythm and no arrhythmias. It was suspected that these apneic episodes are driven by his seizures and it is unclear fistula pulseless or just apneic with difficult to palpate bradycardic pulse. According to the patient's mother he has been medically stable he's not had any fevers recently although his had had congestion and upper respiratory symptoms. He has been evaluated by neurology with adjustments in medications.  ER Evaluation and treatment: Afebrile, vital signs stable, room air saturations at 100% 2 view chest x-ray: No acute disease CT the head without contrast: No acute intracranial process but did demonstrate evidence of paranasal sinus mucosal thickening EKG: Sinus rhythm with ventricular rate 92 bpm, QTC 437 ms, elevated J-point Lactic acid 1.26 Troponin 0.03 Electrolyte panel unremarkable CBC unremarkable Urinalysis with cloudy appearance, rare bacteria, granular casts, 250 of glucose, moderate hemoglobin, 100 protein, specific gravity 1.016, WBC 0-5 Urine drug screen positive for benzodiazepines noting patient on this medicine prior to admission   Review of Systems  In addition to the HPI above,  No Fever-chills, myalgias or other constitutional symptoms No Headache, changes with Vision or hearing, new weakness, tingling, numbness in any extremity, No problems swallowing food or Liquids, indigestion/reflux No Chest pain, Cough or Shortness of Breath, palpitations, orthopnea or DOE No Abdominal pain, N/V; no melena or hematochezia, no dark tarry stools, Bowel movements are regular, No dysuria, hematuria or flank pain No new skin rashes, lesions, masses or bruises, No new joints pains-aches No recent weight gain or loss No polyuria, polydypsia or polyphagia,  *A full 10 point Review of Systems was done, except as stated above, all other Review of Systems were negative.  Social History Social History  Substance Use  Topics  . Smoking status: Never Smoker   . Smokeless tobacco: Never Used  . Alcohol Use: No    Resides at: Private residence  Lives with: Mother  Ambulatory status: Without assistive devices   Family History Family History  Problem Relation Age of Onset  . Breast cancer Mother   . Asthma Father   . Diabetes Father   . Coronary artery disease Maternal Grandfather   . Congestive Heart Failure Maternal Grandfather     Died at 27  . Pneumonia Maternal Grandfather   . Coronary artery disease Paternal Grandfather   . Lung cancer Paternal Grandfather     Died at 33  . Pneumonia Paternal Grandmother     Died at 57  . Stroke Paternal Grandmother      Prior to Admission medications   Medication Sig Start Date End Date Taking? Authorizing Provider  carbamazepine (TEGRETOL) 100 MG chewable tablet CHEW AND SWALLOW TWO AND ONE-HALF TABLET THREE TIMES DAILY 03/29/15  Yes Rockwell Germany, NP  cetirizine (ZYRTEC) 10 MG tablet Take 10 mg by mouth daily. 02/12/15  Yes Historical Provider, MD  diazepam (VALIUM) 2 MG tablet Take 2 mg by mouth every 6 (six) hours as needed (for seizures).   Yes Historical Provider, MD  KEPPRA 500 MG tablet TAKE THREE TABLETS BY MOUTH TWICE DAILY 05/25/15  Yes Rockwell Germany, NP  KLOR-CON M20 20 MEQ tablet TAKE 1 TABLET BY MOUTH 4 TIMES A DAY 07/23/15  Yes Rockwell Germany, NP  levothyroxine (SYNTHROID, LEVOTHROID) 25 MCG tablet Take 25 mcg by mouth daily before breakfast.   Yes Historical Provider, MD  metoCLOPramide (REGLAN) 10 MG tablet Take 1 tablet (10 mg total) by mouth every 6 (six) hours as needed for nausea (nausea/headache). 06/26/15  Yes Mercedes Camprubi-Soms, PA-C  montelukast (SINGULAIR) 10 MG tablet Take 10 mg by mouth at bedtime. 12/28/14 12/28/15 Yes Historical Provider, MD  Multiple Vitamin (MULTIVITAMIN WITH MINERALS) TABS Take 1 tablet by mouth daily.   Yes Historical Provider, MD  [name] 10 MG tablet TAKE 1/2 TABLET BY MOUTH 3 TIMES A DAY 07/23/15   Yes Rockwell Germany, NP  potassium phosphate, monobasic, (K-PHOS ORIGINAL) 500 MG tablet Take 1 tablet (500 mg total) by mouth 4 (four) times daily. 07/13/15  Yes Jodi Geralds, MD  rosuvastatin (CRESTOR) 10 MG tablet Take 10 mg by mouth at bedtime. 03/13/15 03/12/16 Yes Historical Provider, MD  Topiramate ER (TROKENDI XR) 200 MG CP24 Take 1 capsule by mouth at bedtime. 09/27/15  Yes Jodi Geralds, MD  TROKENDI XR 50 MG CP24 Take 2 capsules by mouth at bedtime. Patient taking differently: Take 1 capsule by mouth at bedtime.  09/27/15  Yes Jodi Geralds, MD  VIMPAT 50 MG TABS tablet TAKE 1 TABLET BY MOUTH EVERY MORNING, TAKE  1 TABLET MIDDAY, AND TAKE 2 TABLETS AT BEDTIME 09/10/15  Yes Rockwell Germany, NP  acetaminophen (TYLENOL) 500 MG tablet Take 1,000 mg by mouth every 6 (six) hours as needed for headache.    Historical Provider, MD  guaifenesin (HUMIBID E) 400 MG TABS tablet Take 400 mg by mouth 2 (two) times daily.    Historical Provider, MD  LORazepam (ATIVAN) 2 MG/ML concentrated solution Withdraw 64ml (2mg ) and 70ml  Normal saline into 3 ml syringe. Administer IV push over 3-5 minutes as needed for recurrent seizures 06/26/15   Rockwell Germany, NP  ondansetron (ZOFRAN-ODT) 4 MG disintegrating tablet Take 1 tablet as needed for nausea and vomiting 09/27/15   Jodi Geralds, MD  PHENObarbital (LUMINAL) 65 MG/ML injection Withdraw 18ml (65mg ) Phenobarbital and 29ml Normal Saline into 42ml syringe. Adminster IV push for 3-5 minutes as needed for recurrent seizures 06/26/15   Rockwell Germany, NP    Allergies  Allergen Reactions  . Antihistamines, Chlorpheniramine-Type Other (See Comments)    Other Reaction: Dimetapp causes seizures  . Divalproex Sodium Other (See Comments)    Nausea, vomiting, liver and kidney dysfunction  . Phenytoin Rash and Other (See Comments)    Dilantin causes more seizures.   . Valproic Acid And Related Other (See Comments)    Shuts down systems  . Vancomycin  Rash and Other (See Comments)    Other Reaction: Redman's Syndrome    Physical Exam  Vitals  Blood pressure 136/97, pulse 88, temperature 97.8 F (36.6 C), temperature source Oral, resp. rate 28, height 5\' 3"  (1.6 m), weight 117 lb (53.071 kg), SpO2 100 %.   General:  In no acute distress, appears healthy and well nourished  Psych:  Normal and appropriate affect her underlying development on disability, Denies Suicidal or Homicidal ideations, Awake Alert, Oriented X 3.  Neuro:   No focal neurological deficits, CN II through XII intact, Strength 5/5 all 4 extremities, Sensation intact all 4 extremities.  ENT:  Ears and Eyes appear Normal, Conjunctivae clear, PER. Moist oral mucosa without erythema or exudates. Wet sounding cough with nasal congestion noted  Neck:  Supple, No lymphadenopathy appreciated  Respiratory:  Symmetrical chest wall movement, Good air movement bilaterally, CTAB. Room Air  Cardiac:  RRR, No Murmurs, no LE edema noted, no JVD, No carotid bruits, peripheral pulses palpable at 2+  Abdomen:  Positive bowel sounds, Soft, Non tender, Non distended,  No masses appreciated, no obvious hepatosplenomegaly  Skin:  No Cyanosis, Normal Skin Turgor, No Skin Rash or Bruise.  Extremities: Symmetrical without obvious trauma or injury,  no effusions.  Data Review  CBC  Recent Labs Lab 09/29/15 1149 09/29/15 1152  WBC 7.8  --   HGB 14.0 16.3  HCT 43.0 48.0  PLT 240  --   MCV 87.4  --   MCH 28.5  --   MCHC 32.6  --   RDW 13.5  --   LYMPHSABS 1.3  --   MONOABS 0.7  --   EOSABS 0.1  --   BASOSABS 0.0  --     Chemistries   Recent Labs Lab 09/29/15 1149 09/29/15 1152  NA 140 142  K 4.1 3.9  CL 108 107  CO2 23  --   GLUCOSE 102* 93  BUN 8 9  CREATININE 0.93 0.80  CALCIUM 9.0  --     estimated creatinine clearance is 103.3 mL/min (by C-G formula based on Cr of 0.8).  No results for input(s): TSH, T4TOTAL, T3FREE, THYROIDAB  in the last 72  hours.  Invalid input(s): FREET3  Coagulation profile No results for input(s): INR, PROTIME in the last 168 hours.  No results for input(s): DDIMER in the last 72 hours.  Cardiac Enzymes No results for input(s): CKMB, TROPONINI, MYOGLOBIN in the last 168 hours.  Invalid input(s): CK  Invalid input(s): POCBNP  Urinalysis    Component Value Date/Time   COLORURINE YELLOW 09/29/2015 1215   APPEARANCEUR CLOUDY* 09/29/2015 1215   LABSPEC 1.016 09/29/2015 1215   PHURINE 6.5 09/29/2015 1215   GLUCOSEU 250* 09/29/2015 1215   HGBUR MODERATE* 09/29/2015 1215   BILIRUBINUR NEGATIVE 09/29/2015 1215   KETONESUR NEGATIVE 09/29/2015 1215   PROTEINUR 100* 09/29/2015 1215   UROBILINOGEN 0.2 04/21/2014 2218   NITRITE NEGATIVE 09/29/2015 1215   LEUKOCYTESUR NEGATIVE 09/29/2015 1215    Imaging results:   Dg Chest 2 View  09/29/2015  CLINICAL DATA:  Status post seizure this morning. Initial encounter. EXAM: CHEST  2 VIEW COMPARISON:  PA and lateral chest 05/13/2015. FINDINGS: [name] and neural stimulator again seen. The lungs are clear. Heart size is upper normal. No pneumothorax or pleural effusion. IMPRESSION: No acute disease. Electronically Signed   By: Inge Rise M.D.   On: 09/29/2015 11:14   Ct Head Wo Contrast  09/29/2015  CLINICAL DATA:  Patient with seizure.  Altered mental status. EXAM: CT HEAD WITHOUT CONTRAST TECHNIQUE: Contiguous axial images were obtained from the base of the skull through the vertex without intravenous contrast. COMPARISON:  Brain CT 08/02/2013. FINDINGS: Ventricles and sulci are appropriate for patient's age. No evidence for acute cortically based infarct, intracranial hemorrhage, mass lesion or mass effect. Re- demonstrated diffuse bilateral basal ganglia calcifications. The orbits are unremarkable. Mucosal thickening involving the right-greater-than-left maxillary sinuses. Calvarium is intact. IMPRESSION: No acute intracranial process. Paranasal sinus  mucosal thickening. Electronically Signed   By: Lovey Newcomer M.D.   On: 09/29/2015 11:46     EKG: (Independently reviewed)  Sinus rhythm with ventricular rate 92 bpm, QTC 437 ms, elevated J-point   Assessment & Plan  Principal Problem:   Seizure disorder  -Telemetry observation -Seizure precautions -Neurology assisting and has ordered AEDs -Typically prior to admission was utilizing phenobarbital as rescue medicine but neurology has ordered 30 mg at hour sleep scheduled -Neuro checks every 4 hours -Check therapeutic levels of appropriate AEDs  Active Problems:   Respiratory arrest  -In setting of seizure disorder and apnea -Has been extensively worked up by cardiology in the past -Initial EKG and troponin negative so no indication for cycle bite patient receiving CPR in the field    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 ANP on 09/29/2015 at 3:25 PM  You may contact me by going to [email or web address] - password [email or web address]  I am available from 7a-7p but please confirm I am on the schedule by going to Amion as above.   After 7p please contact night coverage person covering me after hours  [facility] I have taken an interval history, reviewed the chart and examined the patient. I agree with the Advanced Practice Provider's note, impression and recommendations. I have made any necessary editorial changes. 30 year old male with  a history of developmental disability, mitral prolapse, severe disorder with frequent recurrent seizures on multiple medications, was brought to the hospital for seizure associated with apnea. As the patient's mother he has had 6 seizures where he  required CPR due to airway issues. Patient was seen by neurology in the ED, although medications were continued, in addition to phenobarbital 30 mg by mouth daily at bedtime scheduled as per neurology notes. Will admit for observation.

## 2015-09-29 NOTE — Consult Note (Signed)
Requesting Physician: Delsa Grana, PA     Reason fo consultation: To manage seizures  HPI:                                                                                                                                         [name] is an 30 y.o. male patient who presented after having a seizure at about 9 AM this morning. His noted to be unresponsive, had cardiac arrest and was stated following the seizure. His mother reported that there is a seventh time he has to be resuscitated following a seizure. He has mild mental retardation, and epilepsy since age 38 months old. Has been intractable with current seizure frequency of once every 2-3 weeks despite being on multiple seizure medications and vagal last him later. He lives with his mother. She reported good compliance with his medications. He takes his medicines at 9 AM in the morning, and this seizure happened just before he was about to take his scheduled medications. He had side effects with higher dose of carbamazepine in the past but reportedly tolerating all of the medications without problems. He is regularly followed by an outpatient neurologist monitoring his medications.  Patient denies any neurologic symptoms at this time. He is at his baseline now.  History obtained from his mother.    Past Medical History  Diagnosis Date  . ALLERGIC RHINITIS 12/26/2009  . MITRAL VALVE PROLAPSE 06/22/2007  . SEIZURE DISORDER 06/22/2007  . Unspecified hypothyroidism 06/22/2007  . Growth disorder   . Mental retardation, mild (I.Q. 50-70)   . Brown's syndrome   . Heart murmur     moderate MR  . Sleep apnea     cpap , last sleep study 10/01/11  . Hypokalemia   . Hyperlipidemia     No therapy  . Blood clot in vein     Past Surgical History  Procedure Laterality Date  . Implantation vagal nerve stimulator      Left  . Portacath placement      and removal 04/08/12  . Eye surgery      X2 for Brown Syndrome    Family History   Problem Relation Age of Onset  . Breast cancer Mother   . Asthma Father   . Diabetes Father   . Coronary artery disease Maternal Grandfather   . Congestive Heart Failure Maternal Grandfather     Died at 25  . Pneumonia Maternal Grandfather   . Coronary artery disease Paternal Grandfather   . Lung cancer Paternal Grandfather     Died at 73  . Pneumonia Paternal Grandmother     Died at 30  . Stroke Paternal Grandmother    Social History:  reports that he has never smoked. He has never used smokeless tobacco. He reports that he does not drink alcohol or use illicit drugs.  Allergies:  Allergies  Allergen Reactions  .  Antihistamines, Chlorpheniramine-Type Other (See Comments)    Other Reaction: Dimetapp causes seizures  . Divalproex Sodium Other (See Comments)    Nausea, vomiting, liver and kidney dysfunction  . Phenytoin Rash and Other (See Comments)    Dilantin causes more seizures.   . Valproic Acid And Related Other (See Comments)    Shuts down systems  . Vancomycin Rash and Other (See Comments)    Other Reaction: Redman's Syndrome    Medications:                                                                                                                          Current facility-administered medications:  .  carbamazepine (TEGRETOL) chewable tablet 250 mg, 250 mg, Oral, 3 times per day, Delsa Grana, PA-C .  cloBAZam ([name]) tablet 5 mg, 5 mg, Oral, 3 times per day, Delsa Grana, PA-C .  lacosamide (VIMPAT) tablet 50 mg, 50 mg, Oral, 2 times per day **AND** lacosamide (VIMPAT) tablet 100 mg, 100 mg, Oral, Daily, Leisa Tapia, PA-C .  levETIRAcetam (KEPPRA) 1,500 mg in sodium chloride 0.9 % 100 mL IVPB, 1,500 mg, Intravenous, 2 times per day, Delsa Grana, PA-C, Last Rate: 460 mL/hr at 09/29/15 1449, 1,500 mg at 09/29/15 1449 .  Topiramate ER CP24 200 mg, 200 mg, Oral, Daily **AND** Topiramate ER CP24 50 mg, 50 mg, Oral, Daily, Delsa Grana, PA-C  Current outpatient  prescriptions:  .  carbamazepine (TEGRETOL) 100 MG chewable tablet, CHEW AND SWALLOW TWO AND ONE-HALF TABLET THREE TIMES DAILY, Disp: 233 tablet, Rfl: 5 .  cetirizine (ZYRTEC) 10 MG tablet, Take 10 mg by mouth daily., Disp: , Rfl:  .  diazepam (VALIUM) 2 MG tablet, Take 2 mg by mouth every 6 (six) hours as needed (for seizures)., Disp: , Rfl:  .  KEPPRA 500 MG tablet, TAKE THREE TABLETS BY MOUTH TWICE DAILY, Disp: 186 tablet, Rfl: 5 .  KLOR-CON M20 20 MEQ tablet, TAKE 1 TABLET BY MOUTH 4 TIMES A DAY, Disp: 124 tablet, Rfl: 5 .  levothyroxine (SYNTHROID, LEVOTHROID) 25 MCG tablet, Take 25 mcg by mouth daily before breakfast., Disp: , Rfl:  .  metoCLOPramide (REGLAN) 10 MG tablet, Take 1 tablet (10 mg total) by mouth every 6 (six) hours as needed for nausea (nausea/headache)., Disp: 20 tablet, Rfl: 0 .  montelukast (SINGULAIR) 10 MG tablet, Take 10 mg by mouth at bedtime., Disp: , Rfl:  .  Multiple Vitamin (MULTIVITAMIN WITH MINERALS) TABS, Take 1 tablet by mouth daily., Disp: , Rfl:  .  [name] 10 MG tablet, TAKE 1/2 TABLET BY MOUTH 3 TIMES A DAY, Disp: 45 tablet, Rfl: 5 .  potassium phosphate, monobasic, (K-PHOS ORIGINAL) 500 MG tablet, Take 1 tablet (500 mg total) by mouth 4 (four) times daily., Disp: 124 tablet, Rfl: 5 .  rosuvastatin (CRESTOR) 10 MG tablet, Take 10 mg by mouth at bedtime., Disp: , Rfl:  .  Topiramate ER (TROKENDI XR) 200 MG CP24, Take 1 capsule by mouth at  bedtime., Disp: 30 capsule, Rfl: 1 .  TROKENDI XR 50 MG CP24, Take 2 capsules by mouth at bedtime. (Patient taking differently: Take 1 capsule by mouth at bedtime. ), Disp: 62 capsule, Rfl: 5 .  VIMPAT 50 MG TABS tablet, TAKE 1 TABLET BY MOUTH EVERY MORNING, TAKE 1 TABLET MIDDAY, AND TAKE 2 TABLETS AT BEDTIME, Disp: 124 tablet, Rfl: 5 .  acetaminophen (TYLENOL) 500 MG tablet, Take 1,000 mg by mouth every 6 (six) hours as needed for headache., Disp: , Rfl:  .  guaifenesin (HUMIBID E) 400 MG TABS tablet, Take 400 mg by mouth 2 (two)  times daily., Disp: , Rfl:  .  LORazepam (ATIVAN) 2 MG/ML concentrated solution, Withdraw 10ml (2mg ) and 85ml  Normal saline into 3 ml syringe. Administer IV push over 3-5 minutes as needed for recurrent seizures, Disp: 5 mL, Rfl: 5 .  ondansetron (ZOFRAN-ODT) 4 MG disintegrating tablet, Take 1 tablet as needed for nausea and vomiting, Disp: 20 tablet, Rfl: 5 .  PHENObarbital (LUMINAL) 65 MG/ML injection, Withdraw 38ml (65mg ) Phenobarbital and 94ml Normal Saline into 54ml syringe. [place]minster IV push for 3-5 minutes as needed for recurrent seizures, Disp: 5 mL, Rfl: 5   ROS:                                                                                                                                       History obtained from the patient  General ROS: negative for - chills, fatigue, fever, night sweats, weight gain or weight loss Psychological ROS: negative for - behavioral disorder, hallucinations, memory difficulties, mood swings or suicidal ideation Ophthalmic ROS: negative for - blurry vision, double vision, eye pain or loss of vision ENT ROS: negative for - epistaxis, nasal discharge, oral lesions, sore throat, tinnitus or vertigo Allergy and Immunology ROS: negative for - hives or itchy/watery eyes Hematological and Lymphatic ROS: negative for - bleeding problems, bruising or swollen lymph nodes Endocrine ROS: negative for - galactorrhea, hair pattern changes, polydipsia/polyuria or temperature intolerance Respiratory ROS: negative for - cough, hemoptysis, shortness of breath or wheezing Cardiovascular ROS: negative for - chest pain, dyspnea on exertion, edema or irregular heartbeat Gastrointestinal ROS: negative for - abdominal pain, diarrhea, hematemesis, nausea/vomiting or stool incontinence Genito-Urinary ROS: negative for - dysuria, hematuria, incontinence or urinary frequency/urgency Musculoskeletal ROS: negative for - joint swelling or muscular weakness Neurological ROS: as noted in  HPI Dermatological ROS: negative for rash and skin lesion changes  Neurologic Examination:  Blood pressure 136/97, pulse 88, temperature 97.8 F (36.6 C), temperature source Oral, resp. rate 28, height 5\' 3"  (1.6 m), weight 53.071 kg (117 lb), SpO2 100 %.  Evaluation of higher integrative functions including: Level of alertness: Alert,  Oriented to time, place and person Recent and remote memory - unable to assess  Attention span and concentration  - intact   Speech: fluent, no evidence of dysarthria or aphasia noted.  Test the following cranial nerves: 2-12 grossly intact Motor examination: Normal tone, bulk, full 5/5 motor strength in all 4 extremities Examination of sensation : Normal and symmetric sensation to pinprick in all 4 extremities and on face Examination of deep tendon reflexes: 2+, normal and symmetric in all extremities, normal plantars bilaterally Test coordination: Normal finger nose testing, with no evidence of limb appendicular ataxia or abnormal involuntary movements or tremors noted.  Gait: Deferred   Lab Results: Basic Metabolic Panel:  Recent Labs Lab 09/29/15 1149 09/29/15 1152  NA 140 142  K 4.1 3.9  CL 108 107  CO2 23  --   GLUCOSE 102* 93  BUN 8 9  CREATININE 0.93 0.80  CALCIUM 9.0  --     Liver Function Tests: No results for input(s): AST, ALT, ALKPHOS, BILITOT, PROT, ALBUMIN in the last 168 hours. No results for input(s): LIPASE, AMYLASE in the last 168 hours. No results for input(s): AMMONIA in the last 168 hours.  CBC:  Recent Labs Lab 09/29/15 1149 09/29/15 1152  WBC 7.8  --   NEUTROABS 5.7  --   HGB 14.0 16.3  HCT 43.0 48.0  MCV 87.4  --   PLT 240  --     Cardiac Enzymes: No results for input(s): CKTOTAL, CKMB, CKMBINDEX, TROPONINI in the last 168 hours.  Lipid Panel: No results for input(s): CHOL, TRIG, HDL, CHOLHDL, VLDL,  LDLCALC in the last 168 hours.  CBG:  Recent Labs Lab 09/29/15 1138  GLUCAP 21    Microbiology: Results for orders placed or performed during the hospital encounter of 05/12/14  MRSA PCR Screening     Status: None   Collection Time: 05/12/14 10:09 PM  Result Value Ref Range Status   MRSA by PCR NEGATIVE NEGATIVE Final    Comment:        The GeneXpert MRSA Assay (FDA approved for NASAL specimens only), is one component of a comprehensive MRSA colonization surveillance program. It is not intended to diagnose MRSA infection nor to guide or monitor treatment for MRSA infections.    Imaging: Dg Chest 2 View  09/29/2015  CLINICAL DATA:  Status post seizure this morning. Initial encounter. EXAM: CHEST  2 VIEW COMPARISON:  PA and lateral chest 05/13/2015. FINDINGS: [name] and neural stimulator again seen. The lungs are clear. Heart size is upper normal. No pneumothorax or pleural effusion. IMPRESSION: No acute disease. Electronically Signed   By: Inge Rise M.D.   On: 09/29/2015 11:14   Ct Head Wo Contrast  09/29/2015  CLINICAL DATA:  Patient with seizure.  Altered mental status. EXAM: CT HEAD WITHOUT CONTRAST TECHNIQUE: Contiguous axial images were obtained from the base of the skull through the vertex without intravenous contrast. COMPARISON:  Brain CT 08/02/2013. FINDINGS: Ventricles and sulci are appropriate for patient's age. No evidence for acute cortically based infarct, intracranial hemorrhage, mass lesion or mass effect. Re- demonstrated diffuse bilateral basal ganglia calcifications. The orbits are unremarkable. Mucosal thickening involving the right-greater-than-left maxillary sinuses. Calvarium is intact. IMPRESSION: No acute intracranial process. Paranasal sinus mucosal  thickening. Electronically Signed   By: Lovey Newcomer M.D.   On: 09/29/2015 11:46     Assessment and plan:   [name] is an 30 y.o. male patient with intractable childhood onset  epilepsy as described above in detail. He presented following a seizure this morning, followed by cardiac arrest and resuscitation. He is at his baseline mental status now.  He has not taken his morning medications yet today. Recommend restarting all his regular seizure medications include in giving his morning dose of medications now. We'll give his first dose of Keppra 1500 mg IV now and continue this by mouth dose of Keppra tonight. As he has been having intractable seizures every 2-3 weeks despite being on several seizure medicines, recommend adding a small dose of phenobarbital 30 mg at bedtime to the further help with his seizures . Discussed the plan with patient and his mother and they agreed to add phenobarbital to the regimen. Continue all his prior seizure medications without any change in  Dosages. He will be admitted for overnight monitoring. CT of the head done today did not show any acute intracranial process. His last brain imaging was in June 2014 per review of EMR, and no significant intracranial pathology contributing to the seizures was noted on the MRI study at that time. If patient remains stable overnight, he can be discharged home tomorrow, and follow-up with his regular outpatient neurologist in 1-2 weeks.

## 2015-09-29 NOTE — ED Provider Notes (Signed)
CSN: [id number]     Arrival date & time 09/29/15  1022 History   First MD Initiated Contact with Patient 09/29/15 1031     Chief Complaint  Patient presents with  . Seizures     (Consider location/radiation/quality/duration/timing/severity/associated sxs/prior Treatment) Patient is a 30 y.o. male presenting with seizures. The history is provided by a parent.  Seizures Seizure activity on arrival: no   Seizure type:  Grand mal Initial focality:  Diffuse Episode characteristics: eye deviation, stiffening and unresponsiveness   Episode characteristics comment:  Apnea with cyanosis Postictal symptoms: somnolence   Postictal symptoms comment:  Unresponsive Return to baseline: yes   Duration:  2 minutes Timing:  Once Number of seizures this episode:  1 Progression:  Unchanged Context: not sleeping less, not drug use, not fever, medical compliance, not possible hypoglycemia and not possible medication ingestion   Recent head injury:  No recent head injuries PTA treatment:  None History of seizures: yes    Patient is a 30 year old male with history of MR, seizure disorder, mitral valve prolapse with implanted vagal nerve stimulator and [name], he presents to the emergency room via EMS for a seizure which began this morning lasting approximately 2 minutes. Mother describes the seizure as a full body tightening, calls it "grand mal."  She states he was sitting on the couch when he began to lean to the side. She was able to lower him to the floor and place him on his side. He did not hit his head or sustain any trauma. She states that when the whole body muscle stiffening. The patient was not breathing, he was unresponsive and became cyanotic around his lips. She reports partially 6-7 episodes in the past like this.  They were unable to find a pulse and initiated CPR. EMS arrived on scene and also could not find a pulse and continued CPR.  The patient was not placed on AICD did not receive any  meds or shocks, approximately 8 minutes after the end of his seizure the mother states that he gasped for air and his coloring improved.  He was still unconscious with his eyes rolled back in his head. At some time in route with EMS he seemed to have some purposeful movements with his left arm. Upon arrival to the ER the patient is reportedly at his baseline, alert and answering questions, only minimally sleepy.   The patient and his mother state that he did not have any prodromal symptoms. He has recently had some sinus congestion and a mild cough, but no fever. They have been compliant with his medications. His mother administers them to him. She was in the process of gathering his medications this morning when he began to seize.  They're patients of Dr. Gaynell Face.   Patient complains of some discomfort in his chest. He denies shortness of breath.  No other complaints  Past Medical History  Diagnosis Date  . ALLERGIC RHINITIS 12/26/2009  . MITRAL VALVE PROLAPSE 06/22/2007  . SEIZURE DISORDER 06/22/2007  . Unspecified hypothyroidism 06/22/2007  . Growth disorder   . Mental retardation, mild (I.Q. 50-70)   . Brown's syndrome   . Heart murmur     moderate MR  . Sleep apnea     cpap , last sleep study 10/01/11  . Hypokalemia   . Hyperlipidemia     No therapy  . Blood clot in vein    Past Surgical History  Procedure Laterality Date  . Implantation vagal nerve stimulator  Left  . Portacath placement      and removal 04/08/12  . Eye surgery      X2 for Brown Syndrome   Family History  Problem Relation Age of Onset  . Breast cancer Mother   . Asthma Father   . Diabetes Father   . Coronary artery disease Maternal Grandfather   . Congestive Heart Failure Maternal Grandfather     Died at 79  . Pneumonia Maternal Grandfather   . Coronary artery disease Paternal Grandfather   . Lung cancer Paternal Grandfather     Died at 107  . Pneumonia Paternal Grandmother     Died at 57  . Stroke  Paternal Grandmother    Social History  Substance Use Topics  . Smoking status: Never Smoker   . Smokeless tobacco: Never Used  . Alcohol Use: No    Review of Systems  Neurological: Positive for seizures.  All other systems reviewed and are negative.     Allergies  Antihistamines, chlorpheniramine-type; Divalproex sodium; Phenytoin; Valproic acid and related; and Vancomycin  Home Medications   Prior to Admission medications   Medication Sig Start Date End Date Taking? Authorizing Provider  carbamazepine (TEGRETOL) 100 MG chewable tablet CHEW AND SWALLOW TWO AND ONE-HALF TABLET THREE TIMES DAILY 03/29/15  Yes Rockwell Germany, NP  cetirizine (ZYRTEC) 10 MG tablet Take 10 mg by mouth daily. 02/12/15  Yes Historical Provider, MD  diazepam (VALIUM) 2 MG tablet Take 2 mg by mouth every 6 (six) hours as needed (for seizures).   Yes Historical Provider, MD  KEPPRA 500 MG tablet TAKE THREE TABLETS BY MOUTH TWICE DAILY 05/25/15  Yes Rockwell Germany, NP  KLOR-CON M20 20 MEQ tablet TAKE 1 TABLET BY MOUTH 4 TIMES A DAY 07/23/15  Yes Rockwell Germany, NP  levothyroxine (SYNTHROID, LEVOTHROID) 25 MCG tablet Take 25 mcg by mouth daily before breakfast.   Yes Historical Provider, MD  metoCLOPramide (REGLAN) 10 MG tablet Take 1 tablet (10 mg total) by mouth every 6 (six) hours as needed for nausea (nausea/headache). 06/26/15  Yes Mercedes Camprubi-Soms, PA-C  montelukast (SINGULAIR) 10 MG tablet Take 10 mg by mouth at bedtime. 12/28/14 12/28/15 Yes Historical Provider, MD  Multiple Vitamin (MULTIVITAMIN WITH MINERALS) TABS Take 1 tablet by mouth daily.   Yes Historical Provider, MD  [name] 10 MG tablet TAKE 1/2 TABLET BY MOUTH 3 TIMES A DAY 07/23/15  Yes Rockwell Germany, NP  potassium phosphate, monobasic, (K-PHOS ORIGINAL) 500 MG tablet Take 1 tablet (500 mg total) by mouth 4 (four) times daily. 07/13/15  Yes Jodi Geralds, MD  rosuvastatin (CRESTOR) 10 MG tablet Take 10 mg by mouth at bedtime. 03/13/15  03/12/16 Yes Historical Provider, MD  Topiramate ER (TROKENDI XR) 200 MG CP24 Take 1 capsule by mouth at bedtime. 09/27/15  Yes Jodi Geralds, MD  TROKENDI XR 50 MG CP24 Take 2 capsules by mouth at bedtime. Patient taking differently: Take 1 capsule by mouth at bedtime.  09/27/15  Yes Jodi Geralds, MD  VIMPAT 50 MG TABS tablet TAKE 1 TABLET BY MOUTH EVERY MORNING, TAKE 1 TABLET MIDDAY, AND TAKE 2 TABLETS AT BEDTIME 09/10/15  Yes Rockwell Germany, NP  acetaminophen (TYLENOL) 500 MG tablet Take 1,000 mg by mouth every 6 (six) hours as needed for headache.    Historical Provider, MD  guaifenesin (HUMIBID E) 400 MG TABS tablet Take 400 mg by mouth 2 (two) times daily.    Historical Provider, MD  LORazepam (ATIVAN) 2 MG/ML concentrated solution  Withdraw 33ml (2mg ) and 54ml  Normal saline into 3 ml syringe. Administer IV push over 3-5 minutes as needed for recurrent seizures 06/26/15   Rockwell Germany, NP  ondansetron (ZOFRAN-ODT) 4 MG disintegrating tablet Take 1 tablet as needed for nausea and vomiting 09/27/15   Jodi Geralds, MD  PHENObarbital (LUMINAL) 65 MG/ML injection Withdraw 39ml (65mg ) Phenobarbital and 23ml Normal Saline into 15ml syringe. [place]minster IV push for 3-5 minutes as needed for recurrent seizures 06/26/15   Rockwell Germany, NP   BP 136/97 mmHg  Pulse 88  Temp(Src) 97.8 F (36.6 C) (Oral)  Resp 28  Ht 5\' 3"  (1.6 m)  Wt 53.071 kg  BMI 20.73 kg/m2  SpO2 100% Physical Exam  Constitutional: He appears well-developed and well-nourished. No distress.  HENT:  Head: Normocephalic and atraumatic.  Nose: Nose normal.  Mouth/Throat: Oropharynx is clear and moist. No oropharyngeal exudate.  Eyes: Conjunctivae and EOM are normal. Pupils are equal, round, and reactive to light. Right eye exhibits no discharge. Left eye exhibits no discharge. No scleral icterus.  Neck: Normal range of motion. No JVD present. No tracheal deviation present. No thyromegaly present.  Cardiovascular: Normal  rate, regular rhythm, normal heart sounds and intact distal pulses.  Exam reveals no gallop and no friction rub.   No murmur heard. Radial pulses 2+, LE pulses 1+, no LE edema  Pulmonary/Chest: Effort normal and breath sounds normal. No respiratory distress. He has no wheezes. He has no rales. He exhibits tenderness.  Anterior central chest - ribs and sternum - ttp  Abdominal: Soft. Bowel sounds are normal. He exhibits no distension. There is no tenderness. There is no rebound and no guarding.  Musculoskeletal: He exhibits no edema or tenderness.  Lymphadenopathy:    He has no cervical adenopathy.  Neurological: He is alert. He has normal reflexes. He displays no tremor. He displays no seizure activity. GCS eye subscore is 4. GCS verbal subscore is 5. GCS motor subscore is 6.  Skin: Skin is warm and dry. No rash noted. He is not diaphoretic. No cyanosis or erythema. No pallor. Nails show no clubbing.  Psychiatric: He has a normal mood and affect. His behavior is normal. Judgment and thought content normal. His speech is slurred.  Nursing note and vitals reviewed.   ED Course  Procedures (including critical care time) Labs Review Labs Reviewed  BASIC METABOLIC PANEL - Abnormal; Notable for the following:    Glucose, Bld 102 (*)    All other components within normal limits  URINALYSIS, ROUTINE W REFLEX MICROSCOPIC (NOT AT [facility]) - Abnormal; Notable for the following:    APPearance CLOUDY (*)    Glucose, UA 250 (*)    Hgb urine dipstick MODERATE (*)    Protein, ur 100 (*)    All other components within normal limits  URINE RAPID DRUG SCREEN, HOSP PERFORMED - Abnormal; Notable for the following:    Benzodiazepines POSITIVE (*)    All other components within normal limits  CARBAMAZEPINE LEVEL, TOTAL - Abnormal; Notable for the following:    Carbamazepine Lvl 12.9 (*)    All other components within normal limits  URINE MICROSCOPIC-ADD ON - Abnormal; Notable for the following:    Squamous  Epithelial / LPF 0-5 (*)    Bacteria, UA RARE (*)    Casts GRANULAR CAST (*)    All other components within normal limits  CBC WITH DIFFERENTIAL/PLATELET  LEVETIRACETAM LEVEL  PHENOBARBITAL LEVEL  I-STAT TROPOININ, ED  I-STAT CHEM 8, ED  CBG MONITORING, ED  I-STAT CG4 LACTIC ACID, ED  I-STAT CG4 LACTIC ACID, ED    Imaging Review Dg Chest 2 View  09/29/2015  CLINICAL DATA:  Status post seizure this morning. Initial encounter. EXAM: CHEST  2 VIEW COMPARISON:  PA and lateral chest 05/13/2015. FINDINGS: [name] and neural stimulator again seen. The lungs are clear. Heart size is upper normal. No pneumothorax or pleural effusion. IMPRESSION: No acute disease. Electronically Signed   By: Inge Rise M.D.   On: 09/29/2015 11:14   Ct Head Wo Contrast  09/29/2015  CLINICAL DATA:  Patient with seizure.  Altered mental status. EXAM: CT HEAD WITHOUT CONTRAST TECHNIQUE: Contiguous axial images were obtained from the base of the skull through the vertex without intravenous contrast. COMPARISON:  Brain CT 08/02/2013. FINDINGS: Ventricles and sulci are appropriate for patient's age. No evidence for acute cortically based infarct, intracranial hemorrhage, mass lesion or mass effect. Re- demonstrated diffuse bilateral basal ganglia calcifications. The orbits are unremarkable. Mucosal thickening involving the right-greater-than-left maxillary sinuses. Calvarium is intact. IMPRESSION: No acute intracranial process. Paranasal sinus mucosal thickening. Electronically Signed   By: Lovey Newcomer M.D.   On: 09/29/2015 11:46   I have personally reviewed and evaluated these images and lab results as part of my medical decision-making.   EKG Interpretation   Date/Time:  Saturday September 29 2015 10:37:04 EST Ventricular Rate:  92 PR Interval:  183 QRS Duration: 100 QT Interval:  353 QTC Calculation: 437 R Axis:   69 Text Interpretation:  Sinus rhythm RSR' in V1 or V2, probably normal  variant Borderline T  wave abnormalities No significant change since last  tracing Confirmed by BEATON  MD, ROBERT ([id number]) on 09/29/2015 10:40:48 AM      MDM   Pt with recurrent seizures, hx of MR, seizure today follow by respiratory failure (apnea) with cyanosis, with approximately 8 minutes of CPR administered before pt gasped and coloring improved.  Upon arrival, pt is reportedly at his baseline mental status, although slightly sleepy.  Seizure work up initiated including basic labs, UA, CXR, head CT, drug levels The pt's neurologist was contacted (Dr. Secundino Ginger covering for Dr. Gaynell Face) who advised pt needs 24 hour admission  Inpt Neurology consulted, meds adjusted by Dr. Silverio Decamp. He request medicine admit for obs. Home meds ordered with assistance of pharmacy Hospitalists consulted for admission - [facility] to admit to obs  Final diagnoses:  Seizure [facility])        Delsa Grana, PA-C 10/02/15 [id number]  Leonard Schwartz, MD 10/10/15 1623

## 2015-09-30 ENCOUNTER — Observation Stay (HOSPITAL_COMMUNITY): Payer: Medicaid Other

## 2015-09-30 DIAGNOSIS — G40909 Epilepsy, unspecified, not intractable, without status epilepticus: Secondary | ICD-10-CM

## 2015-09-30 LAB — BASIC METABOLIC PANEL
Anion gap: 8 (ref 5–15)
BUN: 6 mg/dL (ref 6–20)
CO2: 23 mmol/L (ref 22–32)
CREATININE: 0.83 mg/dL (ref 0.61–1.24)
Calcium: 8.8 mg/dL — ABNORMAL LOW (ref 8.9–10.3)
Chloride: 109 mmol/L (ref 101–111)
GFR calc Af Amer: >60 mL/min (ref >60)
Glucose, Bld: 100 mg/dL — ABNORMAL HIGH (ref 65–99)
POTASSIUM: 3.7 mmol/L (ref 3.5–5.1)
SODIUM: 140 mmol/L (ref 135–145)

## 2015-09-30 LAB — CBC
HCT: 38 % — ABNORMAL LOW (ref 39.0–52.0)
Hemoglobin: 12.1 g/dL — ABNORMAL LOW (ref 13.0–17.0)
MCH: 27.8 pg (ref 26.0–34.0)
MCHC: 31.8 g/dL (ref 30.0–36.0)
MCV: 87.4 fL (ref 78.0–100.0)
PLATELETS: 241 10*3/uL (ref 150–400)
RBC: 4.35 MIL/uL (ref 4.22–5.81)
RDW: 13.7 % (ref 11.5–15.5)
WBC: 4.6 10*3/uL (ref 4.0–10.5)

## 2015-09-30 MED ORDER — LORAZEPAM 2 MG/ML IJ SOLN
0.5000 mg | Freq: Once | INTRAMUSCULAR | Status: AC
  Administered 2015-09-30: 0.5 mg via INTRAVENOUS
  Filled 2015-09-30: qty 1

## 2015-09-30 MED ORDER — PHENOBARBITAL 32.4 MG PO TABS: 64.8000 mg | ORAL_TABLET | Freq: Every day | ORAL | Status: DC

## 2015-09-30 MED ORDER — PHENOBARBITAL 64.8 MG PO TABS: 64.8000 mg | ORAL_TABLET | Freq: Every day | ORAL | Status: DC

## 2015-09-30 MED ORDER — PHENOBARBITAL 32.4 MG PO TABS: 32.4000 mg | ORAL_TABLET | Freq: Every day | ORAL | Status: DC

## 2015-09-30 NOTE — Progress Notes (Signed)
EEG completed, results pending. 

## 2015-09-30 NOTE — Progress Notes (Signed)
Interval history  :                                                                                                                                       Thang Craver Hagberg [name] is an 30 y.o. male patient who was admitted following a seizure, with cardiac arrest post resuscitation. His remained stable overnight, with no further seizures. He is taking all his home seizure medications. Phenobarbital 32.4 mg was added to his regimen last night. He is tolerated well without major side effects. He is at his baseline this morning. no new neurological symptoms.    Past Medical History  Diagnosis Date  . ALLERGIC RHINITIS 12/26/2009  . MITRAL VALVE PROLAPSE 06/22/2007  . SEIZURE DISORDER 06/22/2007  . Unspecified hypothyroidism 06/22/2007  . Growth disorder   . Mental retardation, mild (I.Q. 50-70)   . Brown's syndrome   . Heart murmur     moderate MR  . Sleep apnea     cpap , last sleep study 10/01/11  . Hypokalemia   . Hyperlipidemia     No therapy  . Blood clot in vein     Past Surgical History  Procedure Laterality Date  . Implantation vagal nerve stimulator      Left  . Portacath placement      and removal 04/08/12  . Eye surgery      X2 for Brown Syndrome    Family History  Problem Relation Age of Onset  . Breast cancer Mother   . Asthma Father   . Diabetes Father   . Coronary artery disease Maternal Grandfather   . Congestive Heart Failure Maternal Grandfather     Died at 2  . Pneumonia Maternal Grandfather   . Coronary artery disease Paternal Grandfather   . Lung cancer Paternal Grandfather     Died at 31  . Pneumonia Paternal Grandmother     Died at 72  . Stroke Paternal Grandmother    Social History:  reports that he has never smoked. He has never used smokeless tobacco. He reports that he does not drink alcohol or use illicit drugs.  Allergies:  Allergies  Allergen Reactions  . Antihistamines, Chlorpheniramine-Type Other (See Comments)    Other Reaction:  Dimetapp causes seizures  . Divalproex Sodium Other (See Comments)    Nausea, vomiting, liver and kidney dysfunction  . Phenytoin Rash and Other (See Comments)    Dilantin causes more seizures.   . Valproic Acid And Related Other (See Comments)    Shuts down systems  . Vancomycin Rash and Other (See Comments)    Other Reaction: Redman's Syndrome    Medications:  Current facility-administered medications:  .  acetaminophen (TYLENOL) tablet 650 mg, 650 mg, Oral, Q6H PRN **OR** acetaminophen (TYLENOL) suppository 650 mg, 650 mg, Rectal, Q6H PRN, Samella Parr, NP .  carbamazepine (TEGRETOL) chewable tablet 250 mg, 250 mg, Oral, 3 times per day, Delsa Grana, PA-C, 250 mg at 09/30/15 0827 .  cloBAZam ([name]) tablet 5 mg, 5 mg, Oral, 3 times per day, Delsa Grana, PA-C, 5 mg at 09/30/15 0825 .  enoxaparin (LOVENOX) injection 40 mg, 40 mg, Subcutaneous, Q24H, Samella Parr, NP, 40 mg at 09/29/15 1749 .  lacosamide (VIMPAT) tablet 50 mg, 50 mg, Oral, 2 times per day, 50 mg at 09/30/15 0826 **AND** lacosamide (VIMPAT) tablet 100 mg, 100 mg, Oral, Daily, Delsa Grana, PA-C, 100 mg at 09/29/15 2128 .  levETIRAcetam (KEPPRA) tablet 1,500 mg, 1,500 mg, Oral, BID, Makyla Bye Fuller Mandril, MD, 1,500 mg at 09/30/15 [id number] .  levothyroxine (SYNTHROID, LEVOTHROID) tablet 25 mcg, 25 mcg, Oral, QAC breakfast, Samella Parr, NP, 25 mcg at 09/30/15 0825 .  loratadine (CLARITIN) tablet 10 mg, 10 mg, Oral, Daily, Samella Parr, NP, 10 mg at 09/29/15 1749 .  metoCLOPramide (REGLAN) tablet 10 mg, 10 mg, Oral, Q6H PRN, Samella Parr, NP .  montelukast (SINGULAIR) tablet 10 mg, 10 mg, Oral, QHS, Samella Parr, NP, 10 mg at 09/29/15 2128 .  multivitamin with minerals tablet 1 tablet, 1 tablet, Oral, Daily, Samella Parr, NP, 1 tablet at 09/29/15 1749 .  PHENobarbital (LUMINAL) tablet  32.4 mg, 32.4 mg, Oral, QHS, Samella Parr, NP, 32.4 mg at 09/29/15 2127 .  potassium chloride SA (K-DUR,KLOR-CON) CR tablet 20 mEq, 20 mEq, Oral, QID, Samella Parr, NP, 20 mEq at 09/29/15 2127 .  rosuvastatin (CRESTOR) tablet 10 mg, 10 mg, Oral, QHS, Samella Parr, NP, 10 mg at 09/29/15 2128 .  sodium chloride 0.9 % injection 3 mL, 3 mL, Intravenous, Q12H, Samella Parr, NP, 3 mL at 09/29/15 2200 .  Topiramate ER CP24 200 mg, 200 mg, Oral, Daily, 200 mg at 09/29/15 2127 **AND** Topiramate ER CP24 50 mg, 50 mg, Oral, Daily, Delsa Grana, PA-C, 50 mg at 09/29/15 2127    Neurologic Examination:                                                                                                      Blood pressure 96/74, pulse 79, temperature 98.2 F (36.8 C), temperature source Oral, resp. rate 17, height 5\' 3"  (1.6 m), weight 53.887 kg (118 lb 12.8 oz), SpO2 100 %.  Evaluation of higher integrative functions including: Level of alertness: Alert,  Oriented to time, place and person Speech: fluent, no evidence of dysarthria or aphasia noted.  Test the following cranial nerves: 2-12 grossly intact Motor examination: Normal tone, bulk, full 5/5 motor strength in all 4 extremities    Lab Results: Basic Metabolic Panel:  Recent Labs Lab 09/29/15 1149 09/29/15 1152 09/30/15 0530  NA 140 142 140  K 4.1 3.9 3.7  CL 108 107 109  CO2 23  --  23  GLUCOSE 102* 93 100*  BUN 8 9 6   CREATININE 0.93 0.80 0.83  CALCIUM 9.0  --  8.8*    Liver Function Tests: No results for input(s): AST, ALT, ALKPHOS, BILITOT, PROT, ALBUMIN in the last 168 hours. No results for input(s): LIPASE, AMYLASE in the last 168 hours. No results for input(s): AMMONIA in the last 168 hours.  CBC:  Recent Labs Lab 09/29/15 1149 09/29/15 1152 09/30/15 0530  WBC 7.8  --  4.6  NEUTROABS 5.7  --   --   HGB 14.0 16.3 12.1*  HCT 43.0 48.0 38.0*  MCV 87.4  --  87.4  PLT 240  --  241    Cardiac Enzymes: No  results for input(s): CKTOTAL, CKMB, CKMBINDEX, TROPONINI in the last 168 hours.  Lipid Panel: No results for input(s): CHOL, TRIG, HDL, CHOLHDL, VLDL, LDLCALC in the last 168 hours.  CBG:  Recent Labs Lab 09/29/15 1138  GLUCAP 47    Microbiology: Results for orders placed or performed during the hospital encounter of 05/12/14  MRSA PCR Screening     Status: None   Collection Time: 05/12/14 10:09 PM  Result Value Ref Range Status   MRSA by PCR NEGATIVE NEGATIVE Final    Comment:        The GeneXpert MRSA Assay (FDA approved for NASAL specimens only), is one component of a comprehensive MRSA colonization surveillance program. It is not intended to diagnose MRSA infection nor to guide or monitor treatment for MRSA infections.     Imaging: Dg Chest 2 View  09/29/2015  CLINICAL DATA:  Status post seizure this morning. Initial encounter. EXAM: CHEST  2 VIEW COMPARISON:  PA and lateral chest 05/13/2015. FINDINGS: [name] and neural stimulator again seen. The lungs are clear. Heart size is upper normal. No pneumothorax or pleural effusion. IMPRESSION: No acute disease. Electronically Signed   By: Inge Rise M.D.   On: 09/29/2015 11:14   Ct Head Wo Contrast  09/29/2015  CLINICAL DATA:  Patient with seizure.  Altered mental status. EXAM: CT HEAD WITHOUT CONTRAST TECHNIQUE: Contiguous axial images were obtained from the base of the skull through the vertex without intravenous contrast. COMPARISON:  Brain CT 08/02/2013. FINDINGS: Ventricles and sulci are appropriate for patient's age. No evidence for acute cortically based infarct, intracranial hemorrhage, mass lesion or mass effect. Re- demonstrated diffuse bilateral basal ganglia calcifications. The orbits are unremarkable. Mucosal thickening involving the right-greater-than-left maxillary sinuses. Calvarium is intact. IMPRESSION: No acute intracranial process. Paranasal sinus mucosal thickening. Electronically Signed   By: Lovey Newcomer M.D.   On: 09/29/2015 11:46    Assessment and plan:   Jebadiah Boyden Peschke [name] is an 30 y.o. male patient who was admitted following a breakthrough seizure, cardiac arrest status post resuscitation history as described in my consultation note in detail. He remained stable overnight with no further seizures. He is tolerating all of his home seizure medications in addition to new sz medication phenobarbital 32.4 mg at bedtime added to his regimen to help with the frequent seizures occurring every 2-3 weeks. No new neurological issues. He is at his baseline.  No further recommendations from neurology. He can be discharged home and follow-up with his regular neurologist in 1-2 weeks.

## 2015-09-30 NOTE — Discharge Summary (Signed)
Physician Discharge Summary  [name] [id number] DOB: 1986/05/09 DOA: 09/29/2015  PCP: Chesley Noon, MD  Admit date: 09/29/2015 Discharge date: 09/30/2015  Discharge Diagnoses:  Principal Problem:   Seizure disorder [facility]) Active Problems:   Obstructive sleep apnea   Mild intellectual disability   Hypothyroidism   Respiratory arrest ([place])   Seizure ([place])   Discharge Condition: stable  Diet recommendation: general  Filed Weights   09/29/15 1030 09/29/15 1635  Weight: 53.071 kg (117 lb) 53.887 kg (118 lb 12.8 oz)    History of present illness:  30 year old male patient with underlying developmental disability, mitral valve prolapse, seizure disorder with frequent recurrent seizures on multiple medications, Brown syndrome, 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 She was sitting on the couch when he developed tonic-clonic activity lasting about 2 minutes. This was associated with significant apnea halting and apparent loss of pulse thus leading to CPR in the field. According to the patient's mother this is a 6 seizure and for years that has required CPR due to airway issues. He has been previously evaluated by cardiology regarding the syncope/apnea episodes following seizures and it has been documented that there is no clear evidence of cardiac arrhythmias. Telemetry during hospitalizations have been without arrhythmia. That monitor was sinus rhythm and no arrhythmias. It was suspected that these apneic episodes are driven by his seizures and it is unclear fistula pulseless or just apneic with difficult to palpate bradycardic pulse. According to the patient's mother he has been medically stable he's not had any fevers recently although his had had congestion and upper respiratory symptoms. He has been evaluated by neurology with adjustments in  medications.  Hospital Course:  Observed overnight. Started on phenobarbitol.  Mother reported seizure the morning after admission. Seen by neuro, who recommend increasing phenobarbitol to 64.8 mg, and discharge after lunch if stable. No further seizures. May f/u with his neurologist as outpatient  Procedures:  none  Consultations:  neurology  Discharge Exam: Filed Vitals:   09/29/15 2111 09/30/15 0447  BP: 95/71 96/74  Pulse: 86 79  Temp: 98.1 F (36.7 C) 98.2 F (36.8 C)  Resp: 16 17    General: alert, cooperative Cardiovascular: RRR Respiratory: CTA Neuro: nonfocal  Discharge Instructions   Discharge Instructions    Diet general    Complete by:  As directed      Walk with assistance    Complete by:  As directed           Current Discharge Medication List    START taking these medications   Details  PHENobarbital (LUMINAL) 64.8 MG tablet Take 1 tablet (64.8 mg total) by mouth at bedtime. Qty: 30 tablet, Refills: 1      CONTINUE these medications which have NOT CHANGED   Details  carbamazepine (TEGRETOL) 100 MG chewable tablet CHEW AND SWALLOW TWO AND ONE-HALF TABLET THREE TIMES DAILY Qty: 233 tablet, Refills: 5    cetirizine (ZYRTEC) 10 MG tablet Take 10 mg by mouth daily.    diazepam (VALIUM) 2 MG tablet Take 2 mg by mouth every 6 (six) hours as needed (for seizures).    KEPPRA 500 MG tablet TAKE THREE TABLETS BY MOUTH TWICE DAILY Qty: 186 tablet, Refills: 5    KLOR-CON M20 20 MEQ tablet TAKE 1 TABLET BY MOUTH 4 TIMES A DAY Qty: 124 tablet, Refills: 5    levothyroxine (SYNTHROID, LEVOTHROID)  25 MCG tablet Take 25 mcg by mouth daily before breakfast.    metoCLOPramide (REGLAN) 10 MG tablet Take 1 tablet (10 mg total) by mouth every 6 (six) hours as needed for nausea (nausea/headache). Qty: 20 tablet, Refills: 0    montelukast (SINGULAIR) 10 MG tablet Take 10 mg by mouth at bedtime.    Multiple Vitamin (MULTIVITAMIN WITH MINERALS) TABS Take 1  tablet by mouth daily.    [name] 10 MG tablet TAKE 1/2 TABLET BY MOUTH 3 TIMES A DAY Qty: 45 tablet, Refills: 5    potassium phosphate, monobasic, (K-PHOS ORIGINAL) 500 MG tablet Take 1 tablet (500 mg total) by mouth 4 (four) times daily. Qty: 124 tablet, Refills: 5   Associated Diagnoses: Hypokalemia    rosuvastatin (CRESTOR) 10 MG tablet Take 10 mg by mouth at bedtime.    !! Topiramate ER (TROKENDI XR) 200 MG CP24 Take 1 capsule by mouth at bedtime. 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 TROKENDI XR 50 MG CP24 Take 2 capsules by mouth at bedtime. 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    guaifenesin (HUMIBID E) 400 MG TABS tablet Take 400 mg by mouth 2 (two) times daily.    LORazepam (ATIVAN) 2 MG/ML concentrated solution Withdraw 23ml (2mg ) and 75ml  Normal saline into 3 ml syringe. 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 - Potential duplicate medications found. Please discuss with provider.     Allergies  Allergen Reactions  . Antihistamines, Chlorpheniramine-Type Other (See Comments)    Other Reaction: Dimetapp causes seizures  . Divalproex Sodium Other (See Comments)    Nausea, vomiting, liver and kidney dysfunction  . Phenytoin Rash and Other (See Comments)    Dilantin causes more seizures.   . Valproic Acid And Related Other (See Comments)    Shuts down systems  . Vancomycin Rash and Other (See Comments)    Other Reaction: Redman's Syndrome   Follow-up Information    Schedule an appointment as soon as possible for a visit with Jodi Geralds, MD.   Specialties:  Pediatrics, Radiology   Contact information:   [address] [place] [address] [phone]        The results of significant diagnostics from this hospitalization (including imaging, microbiology, ancillary and laboratory) are listed below for reference.    Significant Diagnostic Studies: Dg Chest 2 View  09/29/2015  CLINICAL DATA:  Status post seizure this morning. Initial encounter. EXAM: CHEST  2 VIEW COMPARISON:  PA and lateral chest 05/13/2015. FINDINGS: [name] and neural stimulator again seen. The lungs are clear. Heart size is upper normal. No pneumothorax or pleural effusion. IMPRESSION: No acute disease. Electronically Signed   By: Inge Rise M.D.   On: 09/29/2015 11:14   Ct Head Wo Contrast  09/29/2015  CLINICAL DATA:  Patient with seizure.  Altered mental status. EXAM: CT HEAD WITHOUT CONTRAST TECHNIQUE: Contiguous axial images were obtained from the base of the skull through the vertex without intravenous contrast. COMPARISON:  Brain CT 08/02/2013. FINDINGS: Ventricles and sulci are appropriate for patient's age. No evidence for acute cortically based infarct, intracranial hemorrhage, mass lesion or mass effect. Re-  demonstrated diffuse bilateral basal ganglia calcifications. The orbits are unremarkable. Mucosal thickening involving the right-greater-than-left maxillary sinuses. Calvarium is intact. IMPRESSION: No acute intracranial process. Paranasal sinus mucosal thickening. Electronically Signed   By: Lovey Newcomer M.D.   On: 09/29/2015 11:46    Microbiology: No results found for this or any previous visit (from the past 240 hour(s)).   Labs: Basic Metabolic Panel:  Recent Labs Lab 09/29/15 1149 09/29/15 1152 09/30/15 0530  NA 140 142 140  K 4.1 3.9 3.7  CL 108 107 109  CO2 23  --  23  GLUCOSE 102* 93 100*  BUN 8 9 6   CREATININE 0.93 0.80 0.83  CALCIUM 9.0  --  8.8*   Liver Function Tests: No results for input(s): AST, ALT, ALKPHOS, BILITOT, PROT, ALBUMIN in the last 168 hours. No results for input(s): LIPASE, AMYLASE in the last 168 hours. No results for input(s): AMMONIA in the last 168 hours. CBC:  Recent Labs Lab 09/29/15 1149 09/29/15 1152 09/30/15 0530  WBC 7.8  --  4.6  NEUTROABS 5.7  --   --   HGB 14.0 16.3 12.1*  HCT 43.0 48.0 38.0*  MCV 87.4  --  87.4  PLT 240  --  241   Cardiac Enzymes: No results for input(s): CKTOTAL, CKMB, CKMBINDEX, TROPONINI in the last 168 hours. BNP: BNP (last 3 results) No results for input(s): BNP in the last 8760 hours.  ProBNP (last 3 results) No results for input(s): PROBNP in the last 8760 hours.  CBG:  Recent Labs Lab 09/29/15 1138  GLUCAP 84       Signed:  Delfina Redwood MD.  [facility] 09/30/2015, 9:43 AM

## 2015-10-01 ENCOUNTER — Telehealth: Payer: Self-pay | Admitting: *Deleted

## 2015-10-01 LAB — LEVETIRACETAM LEVEL: LEVETIRACETAM: 16.1 ug/mL (ref 10.0–40.0)

## 2015-10-01 NOTE — Telephone Encounter (Signed)
Patient's mother called and states that [name] had another episode Saturday morning where mom and dad had to do a lot of CPR on him to get him back then EMS came and they took over. The hospital Neurologist added Phenobarbital 64.8mg  to his daily medications and mom was trying to remember if there was a reason that Dr. Gaynell Face may have not put him on that as a daily medication in the past. He did have a seizure in the hospital that was longer and more unusual to those that they are used to as he was trying to fight everyone and was all over the place. Mom states that she would like to talk to Dr. Gaynell Face further about the Phenobarbital and if he thinks that Rylon should be on this medication or if he suggests any other medication.  CB: [phone]

## 2015-10-01 NOTE — Telephone Encounter (Signed)
3 1/2 minute discussion with mother.  I don't want him to take additional medication.  We will change the phenobarbital back to PRN as a rescue drug.

## 2015-10-13 ENCOUNTER — Other Ambulatory Visit: Payer: Self-pay | Admitting: Family

## 2015-10-29 ENCOUNTER — Other Ambulatory Visit: Payer: Self-pay

## 2015-10-29 ENCOUNTER — Ambulatory Visit (HOSPITAL_COMMUNITY): Payer: Medicaid Other | Attending: Cardiovascular Disease

## 2015-10-29 DIAGNOSIS — I059 Rheumatic mitral valve disease, unspecified: Secondary | ICD-10-CM | POA: Diagnosis present

## 2015-10-29 DIAGNOSIS — I340 Nonrheumatic mitral (valve) insufficiency: Secondary | ICD-10-CM | POA: Diagnosis not present

## 2015-10-29 DIAGNOSIS — G4733 Obstructive sleep apnea (adult) (pediatric): Secondary | ICD-10-CM | POA: Diagnosis not present

## 2015-10-29 DIAGNOSIS — I517 Cardiomegaly: Secondary | ICD-10-CM | POA: Diagnosis not present

## 2015-11-03 ENCOUNTER — Inpatient Hospital Stay (HOSPITAL_COMMUNITY)
Admission: EM | Admit: 2015-11-03 | Discharge: 2015-11-07 | DRG: 100 | Disposition: A | Discharge status: Home / Self Care | Payer: Medicaid Other | Attending: Internal Medicine | Admitting: Internal Medicine

## 2015-11-03 ENCOUNTER — Inpatient Hospital Stay (HOSPITAL_COMMUNITY): Payer: Medicaid Other

## 2015-11-03 ENCOUNTER — Emergency Department (HOSPITAL_COMMUNITY): Payer: Medicaid Other

## 2015-11-03 DIAGNOSIS — R739 Hyperglycemia, unspecified: Secondary | ICD-10-CM | POA: Diagnosis present

## 2015-11-03 DIAGNOSIS — Z888 Allergy status to other drugs, medicaments and biological substances status: Secondary | ICD-10-CM | POA: Diagnosis not present

## 2015-11-03 DIAGNOSIS — Z803 Family history of malignant neoplasm of breast: Secondary | ICD-10-CM | POA: Diagnosis not present

## 2015-11-03 DIAGNOSIS — Z23 Encounter for immunization: Secondary | ICD-10-CM | POA: Diagnosis not present

## 2015-11-03 DIAGNOSIS — E039 Hypothyroidism, unspecified: Secondary | ICD-10-CM | POA: Diagnosis present

## 2015-11-03 DIAGNOSIS — I341 Nonrheumatic mitral (valve) prolapse: Secondary | ICD-10-CM | POA: Diagnosis present

## 2015-11-03 DIAGNOSIS — J9601 Acute respiratory failure with hypoxia: Secondary | ICD-10-CM | POA: Diagnosis not present

## 2015-11-03 DIAGNOSIS — Z881 Allergy status to other antibiotic agents status: Secondary | ICD-10-CM

## 2015-11-03 DIAGNOSIS — F70 Mild intellectual disabilities: Secondary | ICD-10-CM | POA: Diagnosis present

## 2015-11-03 DIAGNOSIS — Z801 Family history of malignant neoplasm of trachea, bronchus and lung: Secondary | ICD-10-CM

## 2015-11-03 DIAGNOSIS — Z4659 Encounter for fitting and adjustment of other gastrointestinal appliance and device: Secondary | ICD-10-CM

## 2015-11-03 DIAGNOSIS — E876 Hypokalemia: Secondary | ICD-10-CM | POA: Diagnosis not present

## 2015-11-03 DIAGNOSIS — G40909 Epilepsy, unspecified, not intractable, without status epilepticus: Secondary | ICD-10-CM | POA: Diagnosis not present

## 2015-11-03 DIAGNOSIS — Z8249 Family history of ischemic heart disease and other diseases of the circulatory system: Secondary | ICD-10-CM | POA: Diagnosis not present

## 2015-11-03 DIAGNOSIS — R569 Unspecified convulsions: Secondary | ICD-10-CM

## 2015-11-03 DIAGNOSIS — Z9981 Dependence on supplemental oxygen: Secondary | ICD-10-CM | POA: Diagnosis not present

## 2015-11-03 DIAGNOSIS — I469 Cardiac arrest, cause unspecified: Secondary | ICD-10-CM | POA: Diagnosis not present

## 2015-11-03 DIAGNOSIS — X58XXXA Exposure to other specified factors, initial encounter: Secondary | ICD-10-CM | POA: Diagnosis present

## 2015-11-03 DIAGNOSIS — T85598A Other mechanical complication of other gastrointestinal prosthetic devices, implants and grafts, initial encounter: Secondary | ICD-10-CM

## 2015-11-03 DIAGNOSIS — Z823 Family history of stroke: Secondary | ICD-10-CM

## 2015-11-03 DIAGNOSIS — Y92019 Unspecified place in single-family (private) house as the place of occurrence of the external cause: Secondary | ICD-10-CM | POA: Diagnosis not present

## 2015-11-03 DIAGNOSIS — Z825 Family history of asthma and other chronic lower respiratory diseases: Secondary | ICD-10-CM

## 2015-11-03 DIAGNOSIS — Z87898 Personal history of other specified conditions: Secondary | ICD-10-CM | POA: Diagnosis not present

## 2015-11-03 DIAGNOSIS — G4733 Obstructive sleep apnea (adult) (pediatric): Secondary | ICD-10-CM | POA: Diagnosis present

## 2015-11-03 DIAGNOSIS — G40319 Generalized idiopathic epilepsy and epileptic syndromes, intractable, without status epilepticus: Secondary | ICD-10-CM | POA: Diagnosis not present

## 2015-11-03 DIAGNOSIS — J9602 Acute respiratory failure with hypercapnia: Secondary | ICD-10-CM | POA: Diagnosis present

## 2015-11-03 DIAGNOSIS — G40309 Generalized idiopathic epilepsy and epileptic syndromes, not intractable, without status epilepticus: Principal | ICD-10-CM | POA: Diagnosis present

## 2015-11-03 DIAGNOSIS — E785 Hyperlipidemia, unspecified: Secondary | ICD-10-CM | POA: Diagnosis present

## 2015-11-03 DIAGNOSIS — F79 Unspecified intellectual disabilities: Secondary | ICD-10-CM | POA: Diagnosis not present

## 2015-11-03 DIAGNOSIS — F05 Delirium due to known physiological condition: Secondary | ICD-10-CM | POA: Diagnosis not present

## 2015-11-03 DIAGNOSIS — S20219A Contusion of unspecified front wall of thorax, initial encounter: Secondary | ICD-10-CM | POA: Diagnosis present

## 2015-11-03 DIAGNOSIS — J9600 Acute respiratory failure, unspecified whether with hypoxia or hypercapnia: Secondary | ICD-10-CM | POA: Insufficient documentation

## 2015-11-03 LAB — CBC WITH DIFFERENTIAL/PLATELET
BASOS ABS: 0 10*3/uL (ref 0.0–0.1)
BASOS PCT: 0 %
EOS ABS: 0.3 10*3/uL (ref 0.0–0.7)
Eosinophils Relative: 3 %
HEMATOCRIT: 41.2 % (ref 39.0–52.0)
HEMOGLOBIN: 13.4 g/dL (ref 13.0–17.0)
Lymphocytes Relative: 20 %
Lymphs Abs: 2 10*3/uL (ref 0.7–4.0)
MCH: 28.3 pg (ref 26.0–34.0)
MCHC: 32.5 g/dL (ref 30.0–36.0)
MCV: 86.9 fL (ref 78.0–100.0)
Monocytes Absolute: 0.4 10*3/uL (ref 0.1–1.0)
Monocytes Relative: 4 %
NEUTROS ABS: 7.2 10*3/uL (ref 1.7–7.7)
NEUTROS PCT: 73 %
Platelets: 230 10*3/uL (ref 150–400)
RBC: 4.74 MIL/uL (ref 4.22–5.81)
RDW: 13.4 % (ref 11.5–15.5)
WBC: 9.9 10*3/uL (ref 4.0–10.5)

## 2015-11-03 LAB — COMPREHENSIVE METABOLIC PANEL
ALBUMIN: 3.4 g/dL — AB (ref 3.5–5.0)
ALK PHOS: 118 U/L (ref 38–126)
ALT: 57 U/L (ref 17–63)
AST: 63 U/L — ABNORMAL HIGH (ref 15–41)
Anion gap: 9 (ref 5–15)
BILIRUBIN TOTAL: 0.3 mg/dL (ref 0.3–1.2)
BUN: 8 mg/dL (ref 6–20)
CALCIUM: 8.4 mg/dL — AB (ref 8.9–10.3)
CO2: 22 mmol/L (ref 22–32)
CREATININE: 1.07 mg/dL (ref 0.61–1.24)
Chloride: 109 mmol/L (ref 101–111)
GFR calc Af Amer: >60 mL/min (ref >60)
GFR calc non Af Amer: >60 mL/min (ref >60)
GLUCOSE: 151 mg/dL — AB (ref 65–99)
POTASSIUM: 4 mmol/L (ref 3.5–5.1)
SODIUM: 140 mmol/L (ref 135–145)
Total Protein: 7.2 g/dL (ref 6.5–8.1)

## 2015-11-03 LAB — I-STAT ARTERIAL BLOOD GAS, ED
Acid-base deficit: 8 mmol/L — ABNORMAL HIGH (ref 0.0–2.0)
Bicarbonate: 19.9 mEq/L — ABNORMAL LOW (ref 20.0–24.0)
O2 Saturation: 100 %
PH ART: 7.214 — AB (ref 7.350–7.450)
TCO2: 21 mmol/L (ref 0–100)
pCO2 arterial: 49.2 mmHg — ABNORMAL HIGH (ref 35.0–45.0)
pO2, Arterial: 292 mmHg — ABNORMAL HIGH (ref 80.0–100.0)

## 2015-11-03 LAB — APTT: APTT: 29 s (ref 24–37)

## 2015-11-03 LAB — I-STAT TROPONIN, ED: TROPONIN I, POC: 0.28 ng/mL — AB (ref 0.00–0.08)

## 2015-11-03 LAB — POCT I-STAT 3, ART BLOOD GAS (G3+)
ACID-BASE DEFICIT: 6 mmol/L — AB (ref 0.0–2.0)
BICARBONATE: 19.5 meq/L — AB (ref 20.0–24.0)
O2 Saturation: 98 %
PH ART: 7.33 — AB (ref 7.350–7.450)
TCO2: 21 mmol/L (ref 0–100)
pCO2 arterial: 37 mmHg (ref 35.0–45.0)
pO2, Arterial: 107 mmHg — ABNORMAL HIGH (ref 80.0–100.0)

## 2015-11-03 LAB — PROTIME-INR
INR: 1.18 (ref 0.00–1.49)
PROTHROMBIN TIME: 15.2 s (ref 11.6–15.2)

## 2015-11-03 LAB — LACTIC ACID, PLASMA: Lactic Acid, Venous: 2.2 mmol/L (ref 0.5–2.0)

## 2015-11-03 LAB — PHENOBARBITAL LEVEL

## 2015-11-03 LAB — I-STAT CG4 LACTIC ACID, ED: LACTIC ACID, VENOUS: 1.73 mmol/L (ref 0.5–2.0)

## 2015-11-03 LAB — MRSA PCR SCREENING: MRSA by PCR: NEGATIVE

## 2015-11-03 LAB — TROPONIN I
Troponin I: 0.56 ng/mL (ref <0.031)
Troponin I: 0.65 ng/mL (ref <0.031)

## 2015-11-03 LAB — GLUCOSE, CAPILLARY
GLUCOSE-CAPILLARY: 112 mg/dL — AB (ref 65–99)
GLUCOSE-CAPILLARY: 121 mg/dL — AB (ref 65–99)

## 2015-11-03 LAB — PHOSPHORUS: Phosphorus: 3.4 mg/dL (ref 2.5–4.6)

## 2015-11-03 LAB — CARBAMAZEPINE LEVEL, TOTAL: CARBAMAZEPINE LVL: 11 ug/mL (ref 4.0–12.0)

## 2015-11-03 LAB — PROCALCITONIN

## 2015-11-03 LAB — CORTISOL: CORTISOL PLASMA: 26 ug/dL

## 2015-11-03 LAB — MAGNESIUM: MAGNESIUM: 1.8 mg/dL (ref 1.7–2.4)

## 2015-11-03 MED ORDER — LACOSAMIDE 50 MG PO TABS
100.0000 mg | ORAL_TABLET | Freq: Two times a day (BID) | ORAL | Status: DC
  Administered 2015-11-04 – 2015-11-07 (×8): 100 mg via ORAL
  Filled 2015-11-03 (×8): qty 2

## 2015-11-03 MED ORDER — TOPIRAMATE ER 50 MG PO CAP24
50.0000 mg | ORAL_CAPSULE | Freq: Every day | ORAL | Status: DC
  Filled 2015-11-03 (×2): qty 1

## 2015-11-03 MED ORDER — ACETAMINOPHEN 500 MG PO TABS: 1000.0000 mg | ORAL_TABLET | Freq: Four times a day (QID) | ORAL | Status: DC | PRN

## 2015-11-03 MED ORDER — LACOSAMIDE 50 MG PO TABS
50.0000 mg | ORAL_TABLET | Freq: Every day | ORAL | Status: DC
  Filled 2015-11-03: qty 1

## 2015-11-03 MED ORDER — TOPIRAMATE 100 MG PO TABS
100.0000 mg | ORAL_TABLET | Freq: Two times a day (BID) | ORAL | Status: DC
  Filled 2015-11-03: qty 1

## 2015-11-03 MED ORDER — LORAZEPAM 2 MG/ML IJ SOLN
2.0000 mg | Freq: Once | INTRAMUSCULAR | Status: AC | PRN
  Administered 2015-11-03: 2 mg via INTRAVENOUS
  Filled 2015-11-03: qty 1

## 2015-11-03 MED ORDER — ALBUTEROL SULFATE (2.5 MG/3ML) 0.083% IN NEBU
5.0000 mg | INHALATION_SOLUTION | Freq: Once | RESPIRATORY_TRACT | Status: AC
  Administered 2015-11-03: 5 mg via RESPIRATORY_TRACT
  Filled 2015-11-03: qty 6

## 2015-11-03 MED ORDER — DIAZEPAM 2 MG PO TABS
2.0000 mg | ORAL_TABLET | Freq: Four times a day (QID) | ORAL | Status: DC | PRN
  Administered 2015-11-04: 2 mg via ORAL
  Filled 2015-11-03: qty 1

## 2015-11-03 MED ORDER — CLOBAZAM 10 MG PO TABS
5.0000 mg | ORAL_TABLET | Freq: Three times a day (TID) | ORAL | Status: DC
Start: 2015-11-03 — End: 2015-11-07
  Administered 2015-11-04 – 2015-11-07 (×10): 5 mg via ORAL
  Filled 2015-11-03 (×10): qty 1

## 2015-11-03 MED ORDER — ROSUVASTATIN CALCIUM 10 MG PO TABS
10.0000 mg | ORAL_TABLET | Freq: Every day | ORAL | Status: DC
  Administered 2015-11-04 – 2015-11-06 (×4): 10 mg via ORAL
  Filled 2015-11-03 (×5): qty 1

## 2015-11-03 MED ORDER — DIAZEPAM 5 MG/ML IJ SOLN
INTRAMUSCULAR | Status: AC
  Administered 2015-11-03: 10 mg
  Filled 2015-11-03: qty 2

## 2015-11-03 MED ORDER — LEVETIRACETAM 750 MG PO TABS
1500.0000 mg | ORAL_TABLET | Freq: Two times a day (BID) | ORAL | Status: DC
  Administered 2015-11-03: 1500 mg via ORAL
  Filled 2015-11-03 (×2): qty 2

## 2015-11-03 MED ORDER — CARBAMAZEPINE 100 MG PO CHEW
100.0000 mg | CHEWABLE_TABLET | Freq: Once | ORAL | Status: AC
  Administered 2015-11-03: 100 mg via ORAL
  Filled 2015-11-03: qty 1

## 2015-11-03 MED ORDER — PHENOBARBITAL SODIUM 65 MG/ML IJ SOLN
65.0000 mg | Freq: Every day | INTRAMUSCULAR | Status: DC | PRN
  Administered 2015-11-03 – 2015-11-04 (×2): 65 mg via INTRAVENOUS
  Filled 2015-11-03 (×4): qty 1

## 2015-11-03 MED ORDER — TOPIRAMATE ER 200 MG PO CAP24
200.0000 mg | ORAL_CAPSULE | Freq: Every day | ORAL | Status: DC
  Filled 2015-11-03 (×2): qty 1

## 2015-11-03 MED ORDER — CARBAMAZEPINE 100 MG PO CHEW
250.0000 mg | CHEWABLE_TABLET | Freq: Three times a day (TID) | ORAL | Status: DC
  Administered 2015-11-04 – 2015-11-07 (×10): 250 mg via ORAL
  Filled 2015-11-03 (×2): qty 2.5
  Filled 2015-11-03 (×2): qty 3
  Filled 2015-11-03: qty 2.5
  Filled 2015-11-03: qty 3
  Filled 2015-11-03: qty 2.5
  Filled 2015-11-03: qty 3
  Filled 2015-11-03 (×3): qty 2.5

## 2015-11-03 MED ORDER — ONDANSETRON HCL 4 MG/2ML IJ SOLN
4.0000 mg | Freq: Once | INTRAMUSCULAR | Status: AC
  Administered 2015-11-03: 4 mg via INTRAVENOUS
  Filled 2015-11-03: qty 2

## 2015-11-03 MED ORDER — LEVOTHYROXINE SODIUM 25 MCG PO TABS
25.0000 ug | ORAL_TABLET | Freq: Every day | ORAL | Status: DC
  Administered 2015-11-04 – 2015-11-07 (×4): 25 ug via ORAL
  Filled 2015-11-03 (×5): qty 1

## 2015-11-03 MED ORDER — PIPERACILLIN-TAZOBACTAM 3.375 G IVPB
3.3750 g | Freq: Three times a day (TID) | INTRAVENOUS | Status: DC
  Administered 2015-11-03 – 2015-11-05 (×6): 3.375 g via INTRAVENOUS
  Filled 2015-11-03 (×8): qty 50

## 2015-11-03 MED ORDER — LACOSAMIDE 50 MG PO TABS
50.0000 mg | ORAL_TABLET | Freq: Two times a day (BID) | ORAL | Status: DC
  Administered 2015-11-03: 50 mg via ORAL

## 2015-11-03 MED ORDER — PANTOPRAZOLE SODIUM 40 MG IV SOLR
40.0000 mg | Freq: Every day | INTRAVENOUS | Status: DC
Start: 2015-11-03 — End: 2015-11-05
  Administered 2015-11-03 – 2015-11-04 (×2): 40 mg via INTRAVENOUS
  Filled 2015-11-03 (×3): qty 40

## 2015-11-03 NOTE — ED Notes (Signed)
Pt had 1 full body seizure while being prepared for the floor, lasting 1 minute long.  Dr Jeanell Sparrow at bedside.  O2 sats dropped to 40's until Nrb placed.  Valium given. Critical Care notified of seizure, the fact that pt has not had appropriate amount of tegretol, and that troponin is elevating.

## 2015-11-03 NOTE — Consult Note (Signed)
Reason for Consult: Seizure history Referring Physician: Dr Titus Mould  CC: S/P CPR for suspected arrest (? post ictal)  HPI: [name] is a 30 y.o. male with a history of mental retardation and a growth disorder followed by Dr. Wyline Copas for a complicated neurologic disorder that involves generalized tonic-clonic seizures, postictal migraines, early onset cataracts, intellectual disability, mitral regurgitation, implanted port secondary to poor venous access, and an MRI scan in the past that showed significant calcifications in his basal ganglia.   Much of the history was obtained from Dr. Melanee Left office notes. The patient experiences 1-3 seizures every 3 weeks despite Tegretol 250 mg 3 times daily, diazepam 2 mg every 6 hours as needed for seizures, Keppra 1500 mg twice daily, lorazepam 2 mg IV over 3-5 minutes as needed for recurrent seizures, [name] 10 mg - one half tablet 3 times daily, phenobarbital 65 mg IV push for 3-5 minutes as needed for recurrent seizures, Topiramate ER 200 mg at bedtime, Topiramate XR 50 mg 2 capsules at bedtime, Vimpat 50 mg - one each morning, one at midday, and 2 at bedtime. The patient also has a Vagal Nerve Stimulator implanted. He has experienced cognitive impairment and gait problems secondary to his multiple medications.  On August 21 of 2016 the patient had an early morning seizure and fell out of bed. He appeared cyanotic and apneic. His mother and father initiated CPR without checking his pulse. After 10 compressions and some breaths he seemed to come around the Foley was postictal. He was evaluated in emergency department. His typical seizures are associated with stiffening without clonic activity and apnea, the total duration of which is about a minute and a half. He is postictal for 5 to 8 minutes.   Today the patient was brought to the emergency department at [facility] after CPR was initiated at home for a suspected respiratory  arrest. In the ED the patient was felt to be post ictal. Neurology was asked to evaluate for further recommendations.  The patient's parents report that he has had at least 7 seizures, in the past 3 years, that appeared to cause respiratory arrest during which time CPR was initiated. The most recent was in January of this year. He has also been evaluated at [facility] in the past. Apparently he did not have his AM meds today because of the seizure.     Past Medical History  Diagnosis Date  . ALLERGIC RHINITIS 12/26/2009  . MITRAL VALVE PROLAPSE 06/22/2007  . SEIZURE DISORDER 06/22/2007  . Unspecified hypothyroidism 06/22/2007  . Growth disorder   . Mental retardation, mild (I.Q. 50-70)   . Brown's syndrome   . Heart murmur     moderate MR  . Sleep apnea     cpap , last sleep study 10/01/11  . Hypokalemia   . Hyperlipidemia     No therapy  . Blood clot in vein     Past Surgical History  Procedure Laterality Date  . Implantation vagal nerve stimulator      Left  . Portacath placement      and removal 04/08/12  . Eye surgery      X2 for Brown Syndrome    Family History  Problem Relation Age of Onset  . Breast cancer Mother   . Asthma Father   . Diabetes Father   . Coronary artery disease Maternal Grandfather   . Congestive Heart Failure Maternal Grandfather     Died at 42  .  Pneumonia Maternal Grandfather   . Coronary artery disease Paternal Grandfather   . Lung cancer Paternal Grandfather     Died at 41  . Pneumonia Paternal Grandmother     Died at 29  . Stroke Paternal Grandmother     Social History:  reports that he has never smoked. He has never used smokeless tobacco. He reports that he does not drink alcohol or use illicit drugs.  Allergies  Allergen Reactions  . Antihistamines, Chlorpheniramine-Type Other (See Comments)    Other Reaction: Dimetapp causes seizures  . Divalproex Sodium Other (See Comments)    Nausea, vomiting, liver and kidney  dysfunction  . Phenytoin Rash and Other (See Comments)    Dilantin causes more seizures.   . Valproic Acid And Related Other (See Comments)    Shuts down systems  . Vancomycin Rash and Other (See Comments)    Other Reaction: Redman's Syndrome    Medications:  Scheduled: . lacosamide  50 mg Oral BID  . levETIRAcetam  1,500 mg Oral BID  . [START ON 11/04/2015] levothyroxine  25 mcg Oral QAC breakfast  . ondansetron (ZOFRAN) IV  4 mg Intravenous Once  . pantoprazole (PROTONIX) IV  40 mg Intravenous QHS  . rosuvastatin  10 mg Oral QHS  . Topiramate ER  1 capsule Oral QHS    ROS: History obtained from the patient and his parents  General ROS: negative for - chills, fatigue, fever, night sweats, weight gain or weight loss Psychological ROS: negative for - behavioral disorder, hallucinations, memory difficulties, mood swings or suicidal ideation Ophthalmic ROS: negative for - blurry vision, double vision, eye pain or loss of vision ENT ROS: negative for - epistaxis, nasal discharge, oral lesions, sore throat, tinnitus or vertigo Allergy and Immunology ROS: negative for - hives or itchy/watery eyes Hematological and Lymphatic ROS: negative for - bleeding problems, bruising or swollen lymph nodes Endocrine ROS: negative for - galactorrhea, hair pattern changes, polydipsia/polyuria or temperature intolerance Respiratory ROS: negative for - hemoptysis, shortness of breath or wheezing Positive for recent cough Cardiovascular ROS: negative for - chest pain, dyspnea on exertion, edema or irregular heartbeat Gastrointestinal ROS: negative for - abdominal pain, diarrhea, hematemesis, nausea/vomiting or stool incontinence Genito-Urinary ROS: negative for - dysuria, hematuria, incontinence or urinary frequency/urgency Musculoskeletal ROS: Positive for decreased use of left upper extremity and left ankle weakness. Neurological ROS: as noted in HPI Dermatological ROS: negative for rash and skin  lesion changes   Physical Examination: Blood pressure 99/69, pulse 99, temperature 97.9 F (36.6 C), temperature source Oral, resp. rate 23, height 4\' 9"  (1.448 m), weight 53.524 kg (118 lb), SpO2 100 %.   General - 30 yo male with O2 mask in place.  Heart - Regular rate and rhythm - distant Lungs - diffuse rhonchi Abdomen - Soft - mild diffuse tenderness Extremities - Distal pulses intact - no edema Skin - Warm and dry   Neurologic Examination Mental Status: Somewhat lethargic. (recently had valium 10 mg) Probably post ictal as well. Thought content fairly appropriate.  Oriented to person and place. Not date. Mostly one word answers. Able to follow 2 step commands with prompting. Cranial Nerves: II: Discs not visualized; Visual fields grossly normal, pupils equal, round, reactive to light and accommodation Perez,IV, VI: ptosis not present, extra-ocular motions intact bilaterally V,VII: smile symmetric, facial light touch sensation normal bilaterally VIII: hearing normal bilaterally IX,X: gag reflex present XI: bilateral shoulder shrug XII: midline tongue extension Motor: Strength 3/5 throughout.   Tone and  bulk:normal tone throughout; no atrophy noted Sensory: Light touch intact throughout, bilaterally Deep Tendon Reflexes: 2+ and symmetric throughout Plantars: Right: equivical   Left - equivical Cerebellar: Finger to nose with mild difficulty bilaterally. Gait: not tested   Laboratory Studies:   Basic Metabolic Panel:  Recent Labs Lab 11/03/15 1032  NA 140  K 4.0  CL 109  CO2 22  GLUCOSE 151*  BUN 8  CREATININE 1.07  CALCIUM 8.4*    Liver Function Tests:  Recent Labs Lab 11/03/15 1032  AST 63*  ALT 57  ALKPHOS 118  BILITOT 0.3  PROT 7.2  ALBUMIN 3.4*   No results for input(s): LIPASE, AMYLASE in the last 168 hours. No results for input(s): AMMONIA in the last 168 hours.  CBC:  Recent Labs Lab 11/03/15 1032  WBC 9.9  NEUTROABS 7.2  HGB 13.4   HCT 41.2  MCV 86.9  PLT 230    Cardiac Enzymes: No results for input(s): CKTOTAL, CKMB, CKMBINDEX, TROPONINI in the last 168 hours.  BNP: Invalid input(s): POCBNP  CBG: No results for input(s): GLUCAP in the last 168 hours.  Microbiology: Results for orders placed or performed during the hospital encounter of 05/12/14  MRSA PCR Screening     Status: None   Collection Time: 05/12/14 10:09 PM  Result Value Ref Range Status   MRSA by PCR NEGATIVE NEGATIVE Final    Comment:        The GeneXpert MRSA Assay (FDA approved for NASAL specimens only), is one component of a comprehensive MRSA colonization surveillance program. It is not intended to diagnose MRSA infection nor to guide or monitor treatment for MRSA infections.    Coagulation Studies:  Recent Labs  11/03/15 1428  LABPROT 15.2  INR 1.18    Urinalysis: No results for input(s): COLORURINE, LABSPEC, PHURINE, GLUCOSEU, HGBUR, BILIRUBINUR, KETONESUR, PROTEINUR, UROBILINOGEN, NITRITE, LEUKOCYTESUR in the last 168 hours.  Invalid input(s): APPERANCEUR  Lipid Panel:  No results found for: CHOL, TRIG, HDL, CHOLHDL, VLDL, LDLCALC  HgbA1C: No results found for: HGBA1C  Urine Drug Screen:     Component Value Date/Time   LABOPIA NONE DETECTED 09/29/2015 1215   COCAINSCRNUR NONE DETECTED 09/29/2015 1215   LABBENZ POSITIVE* 09/29/2015 1215   AMPHETMU NONE DETECTED 09/29/2015 1215   THCU NONE DETECTED 09/29/2015 1215   LABBARB NONE DETECTED 09/29/2015 1215    Alcohol Level: No results for input(s): ETH in the last 168 hours.  Other results: EKG:  Sinus tachycardia rate 109 bpm with nonspecific changes. Please refer to the cardiology reading for complete details.   Imaging:  Dg Chest Portable 1 View 11/03/2015   Mild generalized interstitial edema. Suspect noncardiogenic etiology given clinical history and the absence of cardiac enlargement. No airspace consolidation. No pneumothorax.        Assessment/Plan: 30 yo male with long complicated seizure history complicated by respiratory arrest and followed by Dr Gaynell Face. He has also been [facility] at [facility]. Very supportive, knowledgable and caring family. Discussed with Dr Janann Colonel who plans to increase Vimpat dose. Family agrees with this plan    Mikey Bussing PA-C [facility] Pager [phone]  29y/o gentleman with complicated history of refractory seizures presenting with respiratory arrest likely secondary to ictal episode. Patient with additional breakthrough seizure in the ED, received 10mg  dose of valium. Per family he is progressively becoming more responsive and returning to his baseline mental status.  Discussed different treatment options with patients family. Discussed option of increasing daily dose of vimpat  vs possibility of making prn phenobarbital a standing nightly dose as opposed to a rescue medication. Counseled them that my preference would be to max out therapy with vimpat prior to adding a new medication. They are in agreement with plan. In the future can consider addition of daily phenobarbital if indicated.   -increase vimpat to 100mg  in the morning, 50mg  in the afternoon and 100mg  in the evening. If tolerating can consider increasing afternoon dose to 100mg  also  -continue other AEDs at home regimen. Pharmacy reports they will place correct dosing schedule into Epic system -seizure precautions -notified on-call peds neurologist. Plan to notify Dr Gaynell Face on Monday -will continue to follow    Jim Like, DO [facility] [phone]  If 7pm- 7am, please page neurology on call as listed in [place].

## 2015-11-03 NOTE — ED Notes (Signed)
Pt here via [facility] for cyanotic episode.  Pt has extensive hx of seizures.  His mom walked into the room and found him blue and unresponsive in the bed.  They brought him down to the floor and performed 2 rounds of CPR.  When EMS arrived pt hr was 120, sats of 74%, rr 4 and cbg 206.  With assisted ventilations and opa pt O2 sats improved to 100%.  After 10 minutes, pt began responding to voice and is now able to follow commands.

## 2015-11-03 NOTE — Progress Notes (Signed)
[facility] Progress Note Patient Name: Jhared Bizon Pettijohn [name] DOB: [birth year] MRN: [id number]   Date of Service  11/03/2015  HPI/Events of Note  Patient on PO medications for seizures. Patient is now NPO.  eICU Interventions  Will order: 1. Place NGT. 2. KUB to confirm NGT placement.       Intervention Category Intermediate Interventions: Other:  Lysle Dingwall 11/03/2015, 8:56 PM
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
## 2015-11-03 NOTE — H&P (Signed)
PULMONARY / CRITICAL CARE MEDICINE   Name: [name] MRN: [id number] DOB: January 14, 1986    ADMISSION DATE:  11/03/2015   REFERRING MD: EDP  CHIEF COMPLAINT:  Post arrest  HISTORY OF PRESENT ILLNESS:   30 yo special nee wm with severe refractory Szs disorder who had CPR started at home for suspected arrest(most likely postictal) About 6 minutes which EMS dis not continue. In there ER he is awake and interactive and no distress. We will admit and ask neuro to manage his complex neuro medications for suppression of szs. Note despite vagus nerve stimulator/anti szs medications he has szs activity approprioximatleyy 2 x a week.  PAST MEDICAL HISTORY :  He  has a past medical history of ALLERGIC RHINITIS (12/26/2009); MITRAL VALVE PROLAPSE (06/22/2007); SEIZURE DISORDER (06/22/2007); Unspecified hypothyroidism (06/22/2007); Growth disorder; Mental retardation, mild (I.Q. 50-70); Brown's syndrome; Heart murmur; Sleep apnea; Hypokalemia; Hyperlipidemia; and Blood clot in vein.  PAST SURGICAL HISTORY: He  has past surgical history that includes Implantation vagal nerve stimulator; Portacath placement; and Eye surgery.  Allergies  Allergen Reactions  . Antihistamines, Chlorpheniramine-Type Other (See Comments)    Other Reaction: Dimetapp causes seizures  . Divalproex Sodium Other (See Comments)    Nausea, vomiting, liver and kidney dysfunction  . Phenytoin Rash and Other (See Comments)    Dilantin causes more seizures.   . Valproic Acid And Related Other (See Comments)    Shuts down systems  . Vancomycin Rash and Other (See Comments)    Other Reaction: Redman's Syndrome    No current facility-administered medications on file prior to encounter.   Current Outpatient Prescriptions on File Prior to Encounter  Medication Sig  . acetaminophen (TYLENOL) 500 MG tablet Take 1,000 mg by mouth every 6 (six) hours as needed for headache.  . carbamazepine (TEGRETOL) 100 MG chewable tablet  CHEW AND SWALLOW 2 AND 1/2 TABLETS THREE TIMES DAILY  . cetirizine (ZYRTEC) 10 MG tablet Take 10 mg by mouth daily.  . diazepam (VALIUM) 2 MG tablet Take 2 mg by mouth every 6 (six) hours as needed (for seizures).  Marland Kitchen guaifenesin (HUMIBID E) 400 MG TABS tablet Take 400 mg by mouth 2 (two) times daily.  Marland Kitchen KEPPRA 500 MG tablet TAKE THREE TABLETS BY MOUTH TWICE DAILY  . KLOR-CON M20 20 MEQ tablet TAKE 1 TABLET BY MOUTH 4 TIMES A DAY  . levothyroxine (SYNTHROID, LEVOTHROID) 25 MCG tablet Take 25 mcg by mouth daily before breakfast.  . LORazepam (ATIVAN) 2 MG/ML concentrated solution Withdraw 5ml (2mg ) and 53ml  Normal saline into 3 ml syringe. Administer IV push over 3-5 minutes as needed for recurrent seizures  . montelukast (SINGULAIR) 10 MG tablet Take 10 mg by mouth at bedtime.  . Multiple Vitamin (MULTIVITAMIN WITH MINERALS) TABS Take 1 tablet by mouth daily.  . ondansetron (ZOFRAN-ODT) 4 MG disintegrating tablet Take 1 tablet as needed for nausea and vomiting  . [name] 10 MG tablet TAKE 1/2 TABLET BY MOUTH 3 TIMES A DAY  . PHENObarbital (LUMINAL) 65 MG/ML injection Withdraw 68ml (65mg ) Phenobarbital and 34ml Normal Saline into 82ml syringe. Adminster IV push for 3-5 minutes as needed for recurrent seizures  . potassium phosphate, monobasic, (K-PHOS ORIGINAL) 500 MG tablet Take 1 tablet (500 mg total) by mouth 4 (four) times daily.  . rosuvastatin (CRESTOR) 10 MG tablet Take 10 mg by mouth at bedtime.  . Topiramate ER (TROKENDI XR) 200 MG CP24 Take 1 capsule by mouth at bedtime.  Marland Kitchen TROKENDI XR 50  MG CP24 Take 2 capsules by mouth at bedtime. (Patient taking differently: Take 1 capsule by mouth at bedtime. )  . metoCLOPramide (REGLAN) 10 MG tablet Take 1 tablet (10 mg total) by mouth every 6 (six) hours as needed for nausea (nausea/headache). (Patient not taking: Reported on 11/03/2015)  . PHENobarbital (LUMINAL) 64.8 MG tablet Take 1 tablet (64.8 mg total) by mouth at bedtime. (Patient not taking: Reported  on 11/03/2015)    FAMILY HISTORY:  His indicated that his mother is alive. He indicated that his father is alive. He indicated that his sister is alive. He indicated that his maternal grandmother is alive. He indicated that his maternal grandfather is deceased. He indicated that his paternal grandmother is deceased. He indicated that his paternal grandfather is deceased. He indicated that both of his others are alive.   SOCIAL HISTORY: He  reports that he has never smoked. He has never used smokeless tobacco. He reports that he does not drink alcohol or use illicit drugs.  REVIEW OF SYSTEMS:   10 point review of system taken, please see HPI for positives and negatives.   SUBJECTIVE:  NAD at rest  VITAL SIGNS: BP 103/73 mmHg  Pulse 105  Temp(Src) 97.9 F (36.6 C) (Oral)  Resp 33  Ht 4\' 9"  (1.448 m)  Wt 118 lb (53.524 kg)  BMI 25.53 kg/m2  SpO2 100%  HEMODYNAMICS:    VENTILATOR SETTINGS:    INTAKE / OUTPUT:    PHYSICAL EXAMINATION: General:  30 yo growth stunted wm in nad Neuro:  Awake and follws commands HEENT: No neck, no lan Cardiovascular:  HSD RRR Lungs:  Decreasd bs thru out Abdomen:  Soft + bs Musculoskeletal: le contractures Skin:  warm  LABS:  BMET  Recent Labs Lab 11/03/15 1032  NA 140  K 4.0  CL 109  CO2 22  BUN 8  CREATININE 1.07  GLUCOSE 151*    Electrolytes  Recent Labs Lab 11/03/15 1032  CALCIUM 8.4*    CBC  Recent Labs Lab 11/03/15 1032  WBC 9.9  HGB 13.4  HCT 41.2  PLT 230    Coag's No results for input(s): APTT, INR in the last 168 hours.  Sepsis Markers  Recent Labs Lab 11/03/15 1050  LATICACIDVEN 1.73    ABG  Recent Labs Lab 11/03/15 1029  PHART 7.214*  PCO2ART 49.2*  PO2ART 292.0*    Liver Enzymes  Recent Labs Lab 11/03/15 1032  AST 63*  ALT 57  ALKPHOS 118  BILITOT 0.3  ALBUMIN 3.4*    Cardiac Enzymes No results for input(s): TROPONINI, PROBNP in the last 168 hours.  Glucose No  results for input(s): GLUCAP in the last 168 hours.  Imaging Dg Chest Portable 1 View  11/03/2015  CLINICAL DATA:  Cyanotic episode. History of seizures. CPR performed prior to arrival at hospital EXAM: PORTABLE CHEST 1 VIEW COMPARISON:  September 29, 2015 FINDINGS: [name] tip is in the superior vena cava. No pneumothorax. There is a stimulator on the left extending to the left cervical -thoracic junction, stable. There is mild generalized interstitial edema. Lungs elsewhere clear. Heart size and pulmonary vascularity are normal. No adenopathy. No bone lesions. IMPRESSION: Mild generalized interstitial edema. Suspect noncardiogenic etiology given clinical history and the absence of cardiac enlargement. No airspace consolidation. No pneumothorax. Electronically Signed   By: Lowella Grip Perez M.D.   On: 11/03/2015 10:40     STUDIES:    CULTURES: 2/11 bc>>  ANTIBIOTICS: 2/11 zoysn>>  SIGNIFICANT EVENTS: 2/11  post arrest  LINES/TUBES: Rt porta cath>>  DISCUSSION: 30 yo special needs male post cpr.  ASSESSMENT / PLAN:  PULMONARY A: Post arrest. Most likely resp distress post sz OSA O2 dependent P:   O2 as needed No Bipap due to aspiration risk Abx empirically   CARDIOVASCULAR A:  Post CPR after ? Cardiac arrest P:  Check Trop ICU admit Follow labs  RENAL  A:   No acute issue P:  Pos balance allow   GASTROINTESTINAL A:   GI protection P:   PPI  HEMATOLOGIC A:   DV Tprevention P:  Consider sub q hep  INFECTIOUS A:   Presumed aspiration P:   2/11 zosyn>>  ENDOCRINE A:   No acute issue   P:  cbg   NEUROLOGIC A:   Mental retardation Szs Disorder(complex and refractory) followed by Dr. Kandee Keen P:   RASS goal: 0 Neuro asked to msanaaged szs medication   FAMILY  - Updates: Father and mother updated at bedside by Dr. Titus Mould  - Inter-disciplinary family meet or Palliative Care meeting due by:  day 7    Pulmonary and [place] Pager: [phone]  11/03/2015, 1:30 PM   STAFF NOTE: I, Merrie Roof, MD FACP have personally reviewed patient's available data, including medical history, events of note, physical examination and test results as part of my evaluation. I have discussed with resident/NP and other care providers such as pharmacist, RN and RRT. In addition, I personally evaluated patient and elicited key findings of: h/o c/w seizure then post ictal and aspiration, mild distres, speaking full sentences, ronchi moderate diffuse, pcxr mild int changes, abg noted, NO BIPAP poor candidate, seems to impriove slowly as neuro status has  Improved, repeat abg and atempt to lower O2 needs, add zosyn for nosocomial exposure he has had in last 90 days, neuro consult tof seizure and meds adjustment, levels pending, admit icu, repeat abg in 30 min, we updated family in full at bedside The patient is critically ill with multiple organ systems failure and requires high complexity decision making for assessment and support, frequent evaluation and titration of therapies, application of advanced monitoring technologies and extensive interpretation of multiple databases.   Critical Care Time devoted to patient care services described in this note is 30 Minutes. This time reflects time of care of this signee: Merrie Roof, MD FACP. This critical care time does not reflect procedure time, or teaching time or supervisory time of PA/NP/Med student/Med Resident etc but could involve care discussion time. Rest per NP/medical resident whose note is outlined above and that I agree with   Lavon Paganini. Titus Mould, MD, [place] Pgr: [facility] 11/03/2015 2:02 PM

## 2015-11-03 NOTE — Progress Notes (Addendum)
Pharmacy Antibiotic Note  [name] is a 30 y.o. male admitted on 11/03/2015 with aspiration pna.  Pharmacy has been consulted for Zosyn dosing.  Day #1 abx. Lactate 1.73, wbc wnl, afebrile. Cr 1.07, CrCl ~70 ml/min.  Plan: Zosyn 3.375g IV q8h (4 hour infusion).  F/u cx results Monitor clinical progression  Height: 4\' 9"  (144.8 cm) Weight: 118 lb (53.524 kg) IBW/kg (Calculated) : 43.1  Temp (24hrs), Avg:97.9 F (36.6 C), Min:97.9 F (36.6 C), Max:97.9 F (36.6 C)   Recent Labs Lab 11/03/15 1032 11/03/15 1050  WBC 9.9  --   CREATININE 1.07  --   LATICACIDVEN  --  1.73    Estimated Creatinine Clearance: 68.8 mL/min (by C-G formula based on Cr of 1.07).    Allergies  Allergen Reactions  . Antihistamines, Chlorpheniramine-Type Other (See Comments)    Other Reaction: Dimetapp causes seizures  . Divalproex Sodium Other (See Comments)    Nausea, vomiting, liver and kidney dysfunction  . Phenytoin Rash and Other (See Comments)    Dilantin causes more seizures.   . Valproic Acid And Related Other (See Comments)    Shuts down systems  . Vancomycin Rash and Other (See Comments)    Other Reaction: Redman's Syndrome    Antimicrobials this admission: 2/11 Zosyn >>    Dose adjustments this admission: none  Microbiology results: 2/11 BCx: sent  Thank you for allowing pharmacy to be a part of this patient's care.  Wynelle Fanny 11/03/2015 2:19 PM

## 2015-11-03 NOTE — Progress Notes (Signed)
Responded immediately to sound of pt mother calling out "he is having a seizure", nursing staff immediately arrived to  Room to find mother at bedside pt having full body seizure eyes open skin color red flush airway intact. Phenobarbital 65 mg ivp given nasal cannula changed to prb mask and pt placed in recovery position. Sz activity less than one minute after phenobarbital ivp, pt follows commands alert oriented to place and some  Psychosocial questions. Called elink nurse regarding po meds, sz event and am chest xray.

## 2015-11-03 NOTE — Progress Notes (Signed)
[facility] Progress Note Patient Name: [name] DOB: 10-01-1985 MRN: [id number]   Date of Service  11/03/2015  HPI/Events of Note  Troponin #1 = 0.56. Patient is s/p CPR for question of cardiac arrest vs seizure.   eICU Interventions  Given clinical setting, will continue to trend troponin.     Intervention Category Intermediate Interventions: Diagnostic test evaluation  Sommer,Steven Eugene 11/03/2015, 4:21 PM

## 2015-11-04 ENCOUNTER — Inpatient Hospital Stay (HOSPITAL_COMMUNITY): Payer: Medicaid Other

## 2015-11-04 DIAGNOSIS — I469 Cardiac arrest, cause unspecified: Secondary | ICD-10-CM

## 2015-11-04 DIAGNOSIS — Z4659 Encounter for fitting and adjustment of other gastrointestinal appliance and device: Secondary | ICD-10-CM | POA: Insufficient documentation

## 2015-11-04 DIAGNOSIS — J9601 Acute respiratory failure with hypoxia: Secondary | ICD-10-CM

## 2015-11-04 LAB — BASIC METABOLIC PANEL
Anion gap: 10 (ref 5–15)
BUN: 7 mg/dL (ref 6–20)
CALCIUM: 8.5 mg/dL — AB (ref 8.9–10.3)
CHLORIDE: 109 mmol/L (ref 101–111)
CO2: 20 mmol/L — ABNORMAL LOW (ref 22–32)
CREATININE: 0.83 mg/dL (ref 0.61–1.24)
GFR calc non Af Amer: >60 mL/min (ref >60)
Glucose, Bld: 107 mg/dL — ABNORMAL HIGH (ref 65–99)
Potassium: 3.5 mmol/L (ref 3.5–5.1)
SODIUM: 139 mmol/L (ref 135–145)

## 2015-11-04 LAB — STREP PNEUMONIAE URINARY ANTIGEN: STREP PNEUMO URINARY ANTIGEN: NEGATIVE

## 2015-11-04 LAB — URINALYSIS, ROUTINE W REFLEX MICROSCOPIC
BILIRUBIN URINE: NEGATIVE
GLUCOSE, UA: 500 mg/dL — AB
Ketones, ur: 15 mg/dL — AB
Leukocytes, UA: NEGATIVE
Nitrite: NEGATIVE
PROTEIN: NEGATIVE mg/dL
Specific Gravity, Urine: 1.025 (ref 1.005–1.030)
pH: 7 (ref 5.0–8.0)

## 2015-11-04 LAB — CBC
HEMATOCRIT: 37.8 % — AB (ref 39.0–52.0)
HEMOGLOBIN: 12.4 g/dL — AB (ref 13.0–17.0)
MCH: 28.2 pg (ref 26.0–34.0)
MCHC: 32.8 g/dL (ref 30.0–36.0)
MCV: 85.9 fL (ref 78.0–100.0)
Platelets: 208 10*3/uL (ref 150–400)
RBC: 4.4 MIL/uL (ref 4.22–5.81)
RDW: 13.5 % (ref 11.5–15.5)
WBC: 10.1 10*3/uL (ref 4.0–10.5)

## 2015-11-04 LAB — TROPONIN I: TROPONIN I: 0.26 ng/mL — AB (ref <0.031)

## 2015-11-04 LAB — URINE MICROSCOPIC-ADD ON

## 2015-11-04 LAB — GLUCOSE, CAPILLARY
GLUCOSE-CAPILLARY: 83 mg/dL (ref 65–99)
GLUCOSE-CAPILLARY: 100 mg/dL — AB (ref 65–99)
GLUCOSE-CAPILLARY: 78 mg/dL (ref 65–99)
Glucose-Capillary: 97 mg/dL (ref 65–99)

## 2015-11-04 MED ORDER — PNEUMOCOCCAL VAC POLYVALENT 25 MCG/0.5ML IJ INJ
0.5000 mL | INJECTION | INTRAMUSCULAR | Status: AC
  Administered 2015-11-05: 0.5 mL via INTRAMUSCULAR
  Filled 2015-11-04: qty 0.5

## 2015-11-04 MED ORDER — TOPIRAMATE 25 MG PO TABS
125.0000 mg | ORAL_TABLET | Freq: Two times a day (BID) | ORAL | Status: DC
  Administered 2015-11-04 – 2015-11-06 (×6): 125 mg
  Filled 2015-11-04 (×6): qty 1

## 2015-11-04 MED ORDER — LACOSAMIDE 50 MG PO TABS
100.0000 mg | ORAL_TABLET | Freq: Every day | ORAL | Status: DC
  Administered 2015-11-04 – 2015-11-07 (×4): 100 mg via ORAL
  Filled 2015-11-04 (×4): qty 2

## 2015-11-04 MED ORDER — LEVETIRACETAM 100 MG/ML PO SOLN
1500.0000 mg | Freq: Two times a day (BID) | ORAL | Status: DC
  Administered 2015-11-04 – 2015-11-07 (×8): 1500 mg via ORAL
  Filled 2015-11-04 (×9): qty 15

## 2015-11-04 NOTE — Progress Notes (Signed)
Clarification; events documented in [id number] nursing note occurred on 11/03/15 at 2300

## 2015-11-04 NOTE — Progress Notes (Signed)
This rn witnessed twice pt verbalizing feeling of seizure onset aborted by implantable stimulator, no visual seizure activity noted noted.  Mother vigilant at bedside reports 7 progressive episodes of the same aborted by stimulator, no visual seizure activity noted by mother. Phenobarbital 65mg  ivp given

## 2015-11-04 NOTE — Evaluation (Signed)
Clinical/Bedside Swallow Evaluation Patient Details  Name: [name] MRN: [id number] Date of Birth: 07-Jan-1986  Today's Date: 11/04/2015 Time: SLP Start Time (ACUTE ONLY): 1450 SLP Stop Time (ACUTE ONLY): 1507 SLP Time Calculation (min) (ACUTE ONLY): 17 min  Past Medical History:  Past Medical History  Diagnosis Date  . ALLERGIC RHINITIS 12/26/2009  . MITRAL VALVE PROLAPSE 06/22/2007  . SEIZURE DISORDER 06/22/2007  . Unspecified hypothyroidism 06/22/2007  . Growth disorder   . Mental retardation, mild (I.Q. 50-70)   . Brown's syndrome   . Heart murmur     moderate MR  . Sleep apnea     cpap , last sleep study 10/01/11  . Hypokalemia   . Hyperlipidemia     No therapy  . Blood clot in vein    Past Surgical History:  Past Surgical History  Procedure Laterality Date  . Implantation vagal nerve stimulator      Left  . Portacath placement      and removal 04/08/12  . 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 PMH:  ALLERGIC RHINITIS, MITRAL VALVE PROLAPSE, SEIZURE DISORDER, hypothyroidism, Growth disorder; Mental retardation, mild (I.Q. 50-70); Brown's syndrome; Heart murmur; Sleep apnea; Hypokalemia; Hyperlipidemia; and Blood clot in vein. BSE ordered 04/2014, however pt returned to baseline and no assessment needed.    Assessment / Plan / Recommendation Clinical Impression  Pt and mom deny prior dysphagia. Pt presents with mild pharyngeal difficulties likely as a result of large bore NGT in place evidenced by mild facial grimace and effortful swallow during all po presentations. Pt affirms mild odnophagia. Pt small in stature with presumably small pharynx enabling full inversion of epiglottis and/or pharyngeal contraction. Neck appears edematous bilaterally which mom states is his baseline (blood clots in  jugular per mom, "not expected to move per mom's report from MD). SLP recommends removal of NGT if allowed per MD and initiate Dys 2 texture (due to general weakness, possible pharyngeal irritation) and thin liquids, straws allowed with small sips and pills with thin. ST to follow for diet advancement and safety with recommendations.      Aspiration Risk   (mild-mod)    Diet Recommendation Dysphagia 2 (Fine chop);Thin liquid   Liquid Administration via: Cup;Straw Medication Administration: Whole meds with liquid Supervision: Patient able to self feed;Full supervision/cueing for compensatory strategies;Staff to assist with self feeding Compensations: Slow rate;Small sips/bites Postural Changes: Seated upright at 90 degrees    Other  Recommendations Oral Care Recommendations: Oral care BID   Follow up Recommendations  None    Frequency and Duration min 2x/week  2 weeks       Prognosis Prognosis for Safe Diet Advancement: Good      Swallow Study   General HPI: 30 yo special needs admitted with severe refractory Szs disorder who had CPR started at home for suspected arrest (most likely postictal) approximately 6 minutes. PMH:  ALLERGIC RHINITIS, MITRAL VALVE PROLAPSE, SEIZURE DISORDER, hypothyroidism, Growth disorder; Mental retardation, mild (I.Q. 50-70); Brown's syndrome; Heart murmur; Sleep apnea; Hypokalemia; Hyperlipidemia; and Blood clot in vein. BSE ordered 04/2014, however pt returned to baseline and no assessment needed.  Type of Study: Bedside Swallow Evaluation Previous Swallow Assessment:  (see hPI) Diet Prior to this Study: NPO Temperature Spikes Noted: Yes Respiratory Status: Nasal cannula History of Recent  Intubation: No Behavior/Cognition: Cooperative;Pleasant mood (drowsy-mild) Oral Cavity Assessment: Within Functional Limits Oral Care Completed by SLP: Yes Oral Cavity - Dentition: Adequate natural dentition Vision: Functional for self-feeding Self-Feeding  Abilities: Able to feed self;Needs set up;Needs assist Patient Positioning: Upright in bed Baseline Vocal Quality: Low vocal intensity Volitional Cough: Weak Volitional Swallow: Able to elicit    Oral/Motor/Sensory Function Overall Oral Motor/Sensory Function: Generalized oral weakness Facial ROM:  (tremorous) Facial Symmetry: Within Functional Limits Facial Strength:  (decreased/tremerous) Facial Sensation: Within Functional Limits Lingual ROM: Within Functional Limits Lingual Symmetry: Within Functional Limits Lingual Strength: Within Functional Limits Lingual Sensation: Within Functional Limits Velum: Within Functional Limits Mandible:  (? micrognathia)   Ice Chips Ice chips: Impaired Presentation: Spoon Pharyngeal Phase Impairments:  (grimace, appeared effortful)   Thin Liquid Thin Liquid: Impaired Presentation: Spoon;Straw Pharyngeal  Phase Impairments:  (grimace, appeared effortful)    Nectar Thick Nectar Thick Liquid: Not tested   Honey Thick Honey Thick Liquid: Not tested   Puree Puree: Not tested   Solid   GO   Solid: Within functional limits        Houston Siren 11/04/2015,3:42 PM  [phone]

## 2015-11-04 NOTE — Progress Notes (Signed)
Mother at bedside and witnessed pt pulled out ng, mother notified nursing staff. Pt becoming more confused, mother agrees. rr labored audible rhonchi and np cough unchanged from admission. will check abgs per md ok.

## 2015-11-04 NOTE — H&P (Signed)
PULMONARY / CRITICAL CARE MEDICINE   Name: [name] MRN: [id number] DOB: August 14, 1986    ADMISSION DATE:  11/03/2015   REFERRING MD: EDP  CHIEF COMPLAINT:  Post arrest  HISTORY OF PRESENT ILLNESS:   30 yo special nee wm with severe refractory Szs disorder who had CPR started at home for suspected arrest(most likely postictal) About 6 minutes which EMS dis not continue. In there ER he is awake and interactive and no distress. We will admit and ask neuro to manage his complex neuro medications for suppression of szs. Note despite vagus nerve stimulator/anti szs medications he has szs activity approprioximatleyy 2 x a week.  SUBJECTIVE:  ON [place] O2  VITAL SIGNS: BP 97/62 mmHg  Pulse 95  Temp(Src) 100 F (37.8 C) (Oral)  Resp 27  Ht 4\' 9"  (1.448 m)  Wt 51.1 kg (112 lb 10.5 oz)  BMI 24.37 kg/m2  SpO2 100%  HEMODYNAMICS:    VENTILATOR SETTINGS:    INTAKE / OUTPUT: I/O last 3 completed shifts: In: 402.5 [Other:220; NG/GT:120; IV Piggyback:62.5] Out: 450 [Urine:450]  PHYSICAL EXAMINATION: General:  30 yo growth stunted wm in no distress Neuro:  Awake and follws commands well, cough good HEENT: unclear jvd Cardiovascular:  HSD RRR Lungs:  Improved ronchi Abdomen:  Soft + bs, n o r/g Musculoskeletal: le contractures Skin:  warm  LABS:  BMET  Recent Labs Lab 11/03/15 1032  NA 140  K 4.0  CL 109  CO2 22  BUN 8  CREATININE 1.07  GLUCOSE 151*    Electrolytes  Recent Labs Lab 11/03/15 1032 11/03/15 1428  CALCIUM 8.4*  --   MG  --  1.8  PHOS  --  3.4    CBC  Recent Labs Lab 11/03/15 1032 11/04/15 0116  WBC 9.9 10.1  HGB 13.4 12.4*  HCT 41.2 37.8*  PLT 230 208    Coag's  Recent Labs Lab 11/03/15 1428  APTT 29  INR 1.18    Sepsis Markers  Recent Labs Lab 11/03/15 1050 11/03/15 1428  LATICACIDVEN 1.73 2.2*  PROCALCITON  --  <0.10    ABG  Recent Labs Lab 11/03/15 1029 11/03/15 2339  PHART 7.214* 7.330*  PCO2ART  49.2* 37.0  PO2ART 292.0* 107.0*    Liver Enzymes  Recent Labs Lab 11/03/15 1032  AST 63*  ALT 57  ALKPHOS 118  BILITOT 0.3  ALBUMIN 3.4*    Cardiac Enzymes  Recent Labs Lab 11/03/15 1428 11/03/15 1834 11/04/15 0116  TROPONINI 0.56* 0.65* 0.26*    Glucose  Recent Labs Lab 11/03/15 1701 11/03/15 1954  GLUCAP 112* 121*    Imaging Dg Chest Port 1 View  11/04/2015  CLINICAL DATA:  Acute onset of shortness of breath. Initial encounter. EXAM: PORTABLE CHEST 1 VIEW COMPARISON:  Chest radiograph performed 11/03/2015 FINDINGS: The patient's enteric tube is seen extending below the diaphragm. A right-sided chest port is noted ending about the distal SVC. Mild vascular congestion is noted. The lungs remain grossly clear. No pleural effusion or pneumothorax is seen. The cardiomediastinal silhouette is borderline normal in size. A metallic device is noted overlying the left side of the chest. No acute osseous abnormalities are seen. IMPRESSION: Mild vascular congestion noted.  Lungs remain grossly clear. Electronically Signed   By: Garald Balding M.D.   On: 11/04/2015 03:51   Dg Chest Portable 1 View  11/03/2015  CLINICAL DATA:  Cyanotic episode. History of seizures. CPR performed prior to arrival at hospital EXAM: PORTABLE CHEST 1  VIEW COMPARISON:  September 29, 2015 FINDINGS: [name] tip is in the superior vena cava. No pneumothorax. There is a stimulator on the left extending to the left cervical -thoracic junction, stable. There is mild generalized interstitial edema. Lungs elsewhere clear. Heart size and pulmonary vascularity are normal. No adenopathy. No bone lesions. IMPRESSION: Mild generalized interstitial edema. Suspect noncardiogenic etiology given clinical history and the absence of cardiac enlargement. No airspace consolidation. No pneumothorax. Electronically Signed   By: Lowella Grip Perez M.D.   On: 11/03/2015 10:40   Dg Abd Portable 1v  11/04/2015  CLINICAL DATA:   Nasogastric tube placement.  Initial encounter. EXAM: PORTABLE ABDOMEN - 1 VIEW COMPARISON:  Abdominal radiograph performed 11/03/2015 FINDINGS: The patient's enteric tube is noted ending at the duodenum. The visualized bowel gas pattern is grossly unremarkable. No free intra-abdominal air is seen, though evaluation for free air is limited on a single supine view. No acute osseous abnormalities are identified. IMPRESSION: Enteric tube noted ending at the duodenum. Electronically Signed   By: Garald Balding M.D.   On: 11/04/2015 03:40   Dg Abd Portable 1v  11/03/2015  CLINICAL DATA:  Check orogastric tube placement.  Initial encounter. EXAM: PORTABLE ABDOMEN - 1 VIEW COMPARISON:  Chest and abdominal radiographs performed 11/30/2012 FINDINGS: The patient's enteric tube is seen ending overlying the body of the stomach. The visualized bowel gas pattern is unremarkable. Scattered air and stool filled loops of colon are seen; no abnormal dilatation of small bowel loops is seen to suggest small bowel obstruction. No free intra-abdominal air is identified, though evaluation for free air is limited on a single supine view. The visualized osseous structures are within normal limits; the sacroiliac joints are unremarkable in appearance. The visualized lung bases are essentially clear. IMPRESSION: 1. Enteric tube seen ending overlying the body of the stomach. 2. Unremarkable bowel gas pattern; no free intra-abdominal air seen. Small to moderate amount of stool noted in the colon. Electronically Signed   By: Garald Balding M.D.   On: 11/03/2015 23:22     STUDIES:    CULTURES: 2/11 bc>>  ANTIBIOTICS: 2/11 zoysn>>  SIGNIFICANT EVENTS: 2/11 post cpr, likley never lost pulse, seizure at home, asp 2/12- improved O 2 needs  LINES/TUBES: Rt porta cath>>  DISCUSSION: 30 yo special needs male post cpr.  ASSESSMENT / PLAN:  PULMONARY A: Post arrest. Most likely resp distress post sz OSA O2 dependent R/o  chest contusion from CPR P:   O2 as needed, successful to [place] pcxr reviewed, no repeat needed IS addition if able No Bipap due to aspiration risk Abx empirically, likely narrow in am   If lack of progress would ct chest  CARDIOVASCULAR A:  Post CPR after ? Cardiac arrest - unlikely P:  Check Trop flat, dc further Tele to remain  RENAL  A:   No acute issue P:  Pos balance allow to even today  GASTROINTESTINAL A:   GI protection NGT primarily for meds P:   PPI NGT noted, assess output Needs SLP`  HEMATOLOGIC A:   DV Tprevention P:  sub q hep Get cbc in am   INFECTIOUS A:   Presumed aspiration P:   2/11 zosyn>> In am if culture neg , change to unasyn for 3 more days then dc  ENDOCRINE A:   No acute issue   P:  cbg wnl   NEUROLOGIC A:   Mental retardation Szs Disorder(complex and refractory) followed by Dr. Kandee Keen P:   RASS  goal: 0 Per neuro  To [facility], sdu  FAMILY  - Updates: by me in room  - Inter-disciplinary family meet or Palliative Care meeting due by:  day [place]. Titus Mould, MD, [place] Pgr: [facility] 11/04/2015 8:14 AM

## 2015-11-04 NOTE — Progress Notes (Signed)
Subjective: Resting comfortably. Mom reports frequent "small" seizures overnight.   Objective: Current vital signs: BP 92/62 mmHg  Pulse 101  Temp(Src) 97.5 F (36.4 C) (Oral)  Resp 32  Ht 4\' 9"  (1.448 m)  Wt 51.1 kg (112 lb 10.5 oz)  BMI 24.37 kg/m2  SpO2 100% Vital signs in last 24 hours: Temp:  [97.5 F (36.4 C)-100 F (37.8 C)] 97.5 F (36.4 C) (02/12 0817) Pulse Rate:  [69-121] 101 (02/12 0800) Resp:  [16-41] 32 (02/12 0800) BP: (91-119)/(57-85) 92/62 mmHg (02/12 0800) SpO2:  [88 %-100 %] 100 % (02/12 0800) Weight:  [51.1 kg (112 lb 10.5 oz)-53.524 kg (118 lb)] 51.1 kg (112 lb 10.5 oz) (02/12 0412)  Intake/Output from previous day: 02/11 0701 - 02/12 0700 In: 402.5 [NG/GT:120; IV Piggyback:62.5] Out: 450 [Urine:450] Intake/Output this shift: Total I/O In: 20 [Other:20] Out: -  Nutritional status: Diet NPO time specified  Neurologic Exam: Mental Status: Somewhat lethargic but easily aroused. Thought content fairly appropriate. Oriented to person and place. Not date. Mostly one word answers. Able to follow 2 step commands with prompting. Cranial Nerves: II: Visual fields grossly normal, pupils equal, round, reactive to light  [name],IV, VI: extra-ocular motions intact bilaterally V,VII: smile symmetric, facial light touch sensation normal bilaterally Motor: Moves all extremities symmetrically Sensory: Light touch intact throughout, bilaterally   Lab Results: Basic Metabolic Panel:  Recent Labs Lab 11/03/15 1032 11/03/15 1428  NA 140  --   K 4.0  --   CL 109  --   CO2 22  --   GLUCOSE 151*  --   BUN 8  --   CREATININE 1.07  --   CALCIUM 8.4*  --   MG  --  1.8  PHOS  --  3.4    Liver Function Tests:  Recent Labs Lab 11/03/15 1032  AST 63*  ALT 57  ALKPHOS 118  BILITOT 0.3  PROT 7.2  ALBUMIN 3.4*   No results for input(s): LIPASE, AMYLASE in the last 168 hours. No results for input(s): AMMONIA in the last 168 hours.  CBC:  Recent  Labs Lab 11/03/15 1032 11/04/15 0116  WBC 9.9 10.1  NEUTROABS 7.2  --   HGB 13.4 12.4*  HCT 41.2 37.8*  MCV 86.9 85.9  PLT 230 208    Cardiac Enzymes:  Recent Labs Lab 11/03/15 1428 11/03/15 1834 11/04/15 0116  TROPONINI 0.56* 0.65* 0.26*    Lipid Panel: No results for input(s): CHOL, TRIG, HDL, CHOLHDL, VLDL, LDLCALC in the last 168 hours.  CBG:  Recent Labs Lab 11/03/15 1701 11/03/15 1954  GLUCAP 49* 121*    Microbiology: Results for orders placed or performed during the hospital encounter of 11/03/15  MRSA PCR Screening     Status: None   Collection Time: 11/03/15  4:26 PM  Result Value Ref Range Status   MRSA by PCR NEGATIVE NEGATIVE Final    Comment:        The GeneXpert MRSA Assay (FDA approved for NASAL specimens only), is one component of a comprehensive MRSA colonization surveillance program. It is not intended to diagnose MRSA infection nor to guide or monitor treatment for MRSA infections.     Coagulation Studies:  Recent Labs  11/03/15 1428  LABPROT 15.2  INR 1.18    Imaging: Dg Chest Port 1 View  11/04/2015  CLINICAL DATA:  Acute onset of shortness of breath. Initial encounter. EXAM: PORTABLE CHEST 1 VIEW COMPARISON:  Chest radiograph performed 11/03/2015 FINDINGS: The patient's enteric tube  is seen extending below the diaphragm. A right-sided chest port is noted ending about the distal SVC. Mild vascular congestion is noted. The lungs remain grossly clear. No pleural effusion or pneumothorax is seen. The cardiomediastinal silhouette is borderline normal in size. A metallic device is noted overlying the left side of the chest. No acute osseous abnormalities are seen. IMPRESSION: Mild vascular congestion noted.  Lungs remain grossly clear. Electronically Signed   By: Garald Balding M.D.   On: 11/04/2015 03:51   Dg Chest Portable 1 View  11/03/2015  CLINICAL DATA:  Cyanotic episode. History of seizures. CPR performed prior to arrival at  hospital EXAM: PORTABLE CHEST 1 VIEW COMPARISON:  September 29, 2015 FINDINGS: [name] tip is in the superior vena cava. No pneumothorax. There is a stimulator on the left extending to the left cervical -thoracic junction, stable. There is mild generalized interstitial edema. Lungs elsewhere clear. Heart size and pulmonary vascularity are normal. No adenopathy. No bone lesions. IMPRESSION: Mild generalized interstitial edema. Suspect noncardiogenic etiology given clinical history and the absence of cardiac enlargement. No airspace consolidation. No pneumothorax. Electronically Signed   By: Lowella Grip [name] M.D.   On: 11/03/2015 10:40   Dg Abd Portable 1v  11/04/2015  CLINICAL DATA:  Nasogastric tube placement.  Initial encounter. EXAM: PORTABLE ABDOMEN - 1 VIEW COMPARISON:  Abdominal radiograph performed 11/03/2015 FINDINGS: The patient's enteric tube is noted ending at the duodenum. The visualized bowel gas pattern is grossly unremarkable. No free intra-abdominal air is seen, though evaluation for free air is limited on a single supine view. No acute osseous abnormalities are identified. IMPRESSION: Enteric tube noted ending at the duodenum. Electronically Signed   By: Garald Balding M.D.   On: 11/04/2015 03:40   Dg Abd Portable 1v  11/03/2015  CLINICAL DATA:  Check orogastric tube placement.  Initial encounter. EXAM: PORTABLE ABDOMEN - 1 VIEW COMPARISON:  Chest and abdominal radiographs performed 11/30/2012 FINDINGS: The patient's enteric tube is seen ending overlying the body of the stomach. The visualized bowel gas pattern is unremarkable. Scattered air and stool filled loops of colon are seen; no abnormal dilatation of small bowel loops is seen to suggest small bowel obstruction. No free intra-abdominal air is identified, though evaluation for free air is limited on a single supine view. The visualized osseous structures are within normal limits; the sacroiliac joints are unremarkable in appearance.  The visualized lung bases are essentially clear. IMPRESSION: 1. Enteric tube seen ending overlying the body of the stomach. 2. Unremarkable bowel gas pattern; no free intra-abdominal air seen. Small to moderate amount of stool noted in the colon. Electronically Signed   By: Garald Balding M.D.   On: 11/03/2015 23:22    Medications: I have reviewed the patient's current medications.  Assessment/Plan:  29y/o gentleman with complicated history of refractory seizures presenting with respiratory arrest likely secondary to ictal episode. Patient with additional breakthrough seizure in the ED, received 10mg  dose of valium. Per family he is progressively becoming more responsive and returning to his baseline mental status.  Discussed different treatment options with patients family. Discussed option of increasing daily dose of vimpat vs possibility of making prn phenobarbital a standing nightly dose as opposed to a rescue medication. Counseled them that my preference would be to max out therapy with vimpat prior to adding a new medication. They are in agreement with plan. In the future can consider addition of daily phenobarbital if indicated.   -Will increase afternoon vimpat dose to 100mg  -  continue other AEDs at home regimen.  -Plan to notify Dr Gaynell Face on Monday -will continue to follow   LOS: 1 day   Jim Like, DO [facility] [phone]  If 7pm- 7am, please page neurology on call as listed in [facility]. 11/04/2015  9:03 AM

## 2015-11-05 ENCOUNTER — Telehealth: Payer: Self-pay | Admitting: *Deleted

## 2015-11-05 DIAGNOSIS — G40319 Generalized idiopathic epilepsy and epileptic syndromes, intractable, without status epilepticus: Secondary | ICD-10-CM

## 2015-11-05 DIAGNOSIS — F05 Delirium due to known physiological condition: Secondary | ICD-10-CM

## 2015-11-05 DIAGNOSIS — E876 Hypokalemia: Secondary | ICD-10-CM

## 2015-11-05 DIAGNOSIS — F79 Unspecified intellectual disabilities: Secondary | ICD-10-CM

## 2015-11-05 LAB — BASIC METABOLIC PANEL
ANION GAP: 7 (ref 5–15)
BUN: 7 mg/dL (ref 6–20)
CHLORIDE: 111 mmol/L (ref 101–111)
CO2: 20 mmol/L — ABNORMAL LOW (ref 22–32)
Calcium: 8.6 mg/dL — ABNORMAL LOW (ref 8.9–10.3)
Creatinine, Ser: 0.94 mg/dL (ref 0.61–1.24)
GFR calc Af Amer: >60 mL/min (ref >60)
GFR calc non Af Amer: >60 mL/min (ref >60)
GLUCOSE: 108 mg/dL — AB (ref 65–99)
POTASSIUM: 3 mmol/L — AB (ref 3.5–5.1)
SODIUM: 138 mmol/L (ref 135–145)

## 2015-11-05 LAB — GLUCOSE, CAPILLARY
GLUCOSE-CAPILLARY: 111 mg/dL — AB (ref 65–99)
GLUCOSE-CAPILLARY: 82 mg/dL (ref 65–99)
GLUCOSE-CAPILLARY: 91 mg/dL (ref 65–99)
Glucose-Capillary: 102 mg/dL — ABNORMAL HIGH (ref 65–99)
Glucose-Capillary: 104 mg/dL — ABNORMAL HIGH (ref 65–99)
Glucose-Capillary: 123 mg/dL — ABNORMAL HIGH (ref 65–99)
Glucose-Capillary: 99 mg/dL (ref 65–99)

## 2015-11-05 LAB — URINE CULTURE: CULTURE: NO GROWTH

## 2015-11-05 MED ORDER — POTASSIUM CHLORIDE CRYS ER 20 MEQ PO TBCR
40.0000 meq | EXTENDED_RELEASE_TABLET | Freq: Once | ORAL | Status: AC
  Administered 2015-11-05: 40 meq via ORAL
  Filled 2015-11-05: qty 2

## 2015-11-05 MED ORDER — ENOXAPARIN SODIUM 30 MG/0.3ML ~~LOC~~ SOLN
30.0000 mg | SUBCUTANEOUS | Status: DC
  Administered 2015-11-06 – 2015-11-07 (×2): 30 mg via SUBCUTANEOUS
  Filled 2015-11-05 (×2): qty 0.3

## 2015-11-05 MED ORDER — GUAIFENESIN 400 MG PO TABS: 400.0000 mg | ORAL_TABLET | Freq: Two times a day (BID) | ORAL | Status: DC

## 2015-11-05 MED ORDER — ADULT MULTIVITAMIN W/MINERALS CH
1.0000 | ORAL_TABLET | Freq: Every day | ORAL | Status: DC
  Administered 2015-11-05 – 2015-11-07 (×3): 1 via ORAL
  Filled 2015-11-05 (×3): qty 1

## 2015-11-05 MED ORDER — GUAIFENESIN 200 MG PO TABS
400.0000 mg | ORAL_TABLET | Freq: Two times a day (BID) | ORAL | Status: DC
  Administered 2015-11-05 – 2015-11-07 (×5): 400 mg via ORAL
  Filled 2015-11-05 (×6): qty 2

## 2015-11-05 MED ORDER — ACETAMINOPHEN 500 MG PO TABS: 1000.0000 mg | ORAL_TABLET | Freq: Four times a day (QID) | ORAL | Status: DC | PRN

## 2015-11-05 MED ORDER — MONTELUKAST SODIUM 10 MG PO TABS
10.0000 mg | ORAL_TABLET | Freq: Every day | ORAL | Status: DC
  Administered 2015-11-05 – 2015-11-06 (×2): 10 mg via ORAL
  Filled 2015-11-05 (×2): qty 1

## 2015-11-05 MED ORDER — ENOXAPARIN SODIUM 40 MG/0.4ML ~~LOC~~ SOLN
40.0000 mg | SUBCUTANEOUS | Status: DC
  Administered 2015-11-05: 40 mg via SUBCUTANEOUS
  Filled 2015-11-05: qty 0.4

## 2015-11-05 NOTE — Progress Notes (Signed)
Speech Language Pathology Treatment: Dysphagia  Patient Details Name: [name] MRN: [id number] DOB: [birth year] Today's Date: 11/05/2015 Time: 0820-0832 SLP Time Calculation (min) (ACUTE ONLY): 12 min  Assessment / Plan / Recommendation Clinical Impression  SLP provided further diagnostic trials of Po to determine further need for modified diet vs ability to tolerate regular diet with assistance. Pt reports no further pain with swallowing and tolerated thin liquids and regular solids without overt signs of aspiration. Greatest risk factor is respiratory/swallow coordination as pt does not consistently exhale after swallow and breathing patter is quite short and rapid. She does have very small bites and sips which seems to compensate for the this. Discussed basic precautions and signs of aspiration with mother. Education complete, pt can upgrade diet with No SLP f/u needed as family plans to provide full supervision.    HPI HPI: 30 yo special needs admitted with severe refractory Szs disorder who had CPR started at home for suspected arrest (most likely postictal) approximately 6 minutes. PMH:  ALLERGIC RHINITIS, MITRAL VALVE PROLAPSE, SEIZURE DISORDER, hypothyroidism, Growth disorder; Mental retardation, mild (I.Q. 50-70); Brown's syndrome; Heart murmur; Sleep apnea; Hypokalemia; Hyperlipidemia; and Blood clot in vein. BSE ordered 04/2014, however pt returned to baseline and no assessment needed.       SLP Plan  Discharge SLP treatment due to (comment)     Recommendations  Diet recommendations: Regular;Thin liquid Liquids provided via: Straw Medication Administration: Whole meds with liquid Supervision: Trained caregiver to feed patient Compensations: Slow rate;Small sips/bites Postural Changes and/or Swallow Maneuvers: Seated upright 90 degrees             Oral Care Recommendations: Oral care BID Follow up Recommendations: None Plan: Discharge SLP treatment due to  (comment)     GO               Herbie Baltimore, MA CCC-SLP [phone]  Lynann Beaver 11/05/2015, 8:50 AM

## 2015-11-05 NOTE — Care Management Note (Addendum)
Case Management Note  Patient Details  Name: [name] MRN: [id number] Date of Birth: 1986-02-01  Subjective/Objective:   Pt admitted with respiratory distress                  Action/Plan:  30 yo mentally handcap M with severe refractory Szs disorder.Pt stays at home with his mom and is cared for by mom, however per mom he is mostly independent; she states she already has all the needed DME including Home O2 from [place] and CPAP from [facility].  Mom denies any barrier of pt to return home.  CM will continue to monitor for disposition need   Expected Discharge Date:  11/06/15               Expected Discharge Plan:  Home/Self Care  In-House Referral:     Discharge planning Services  CM Consult  Post Acute Care Choice:    Choice offered to:     DME Arranged:    DME Agency:     HH Arranged:    HH Agency:     Status of Service:  In process, will continue to follow  Medicare Important Message Given:    Date Medicare IM Given:    Medicare IM give by:    Date Additional Medicare IM Given:    Additional Medicare Important Message give by:     If discussed at [place] of Stay Meetings, dates discussed:    Additional Comments:  Maryclare Labrador, RN 11/05/2015, 2:24 PM

## 2015-11-05 NOTE — Progress Notes (Signed)
[facility] Progress Note Patient Name: [name] DOB: May 13, 1986 MRN: [id number]   Date of Service  11/05/2015  HPI/Events of Note  Hypokalemia  eICU Interventions  Potassium replaced     Intervention Category Minor Interventions: Electrolytes abnormality - evaluation and management  Inioluwa Boulay 11/05/2015, 6:13 AM

## 2015-11-05 NOTE — Progress Notes (Signed)
Called potassium level 3.0 to elink nurse

## 2015-11-05 NOTE — Telephone Encounter (Signed)
Patient's mother called and left a voicemail stating that [name] had another episode on Saturday, [name] had to do CPR on him and it took more than 200 reps to get him to take a breath of air. He is an ICU right now but will be transferred to Neurology soon. They have made the decision to increase vimpat both morning and afternoon dose. Mom wanted to make sure Dr. Gaynell Face was aware that [name] was in ICU and is following what is happening with [name] while in the hospital.  CB: [phone]

## 2015-11-05 NOTE — Progress Notes (Signed)
[facility] TEAM 1 - Stepdown/ICU TEAM PROGRESS NOTE  [name] [id number] DOB: Sep 30, 1985 DOA: 11/03/2015 PCP: Chesley Noon, MD  Admit HPI / Brief Narrative: 30 yo mentally handcap M with severe refractory Szs disorder who had 6 minutes of CPR at home for suspected arrest (most likely postictal) which EMS did not continue. In there ER he was awake and interactive and in no distress. Note despite vagus nerve stimulator/anti szs medications he has szs activity approprioximately 2x a week.  HPI/Subjective: The patient is resting comfortably in bed.  He voices no complaints himself.  His parents state he is somewhat more sedate than his baseline but otherwise does appear to be improving.  He denies chest pain shortness breath fevers chills nausea or vomiting.  Assessment/Plan:  Szs Disorder (complex and refractory) followed by Dr. Gaynell Face - med dosing being adjusted by Neuro - appears stable at present   Post arrest - likely resp distress post sz No evidence of an actual cardiac arrest   Hypokalemia  Due to poor intake - supplement and follow  OSA  Chest contusion from CPR Denies chest pain today   Presumed aspiration Has been afebrile th/o hospital stay - WBC normal - d/c abx and follow clinically   Mental handicap  Parents are his caregivers and appear very attentive to his needs/care  Code Status: FULL Family Communication: no family present at time of exam Disposition Plan: transfer to neuro med/surg bed - PT/OT - med titration per Neuro   Consultants: Neuro  PCCM  Procedures: none  Antibiotics: Zosyn 2/11 > 2/13  DVT prophylaxis: lovenox   Objective: Blood pressure 98/75, pulse 90, temperature 98.3 F (36.8 C), temperature source Oral, resp. rate 25, height 4\' 9"  (1.448 m), weight 48.762 kg (107 lb 8 oz), SpO2 99 %.  Intake/Output Summary (Last 24 hours) at 11/05/15 0951 Last data filed at 11/05/15 [id number]  Gross per 24 hour  Intake   607.5 ml  Output    250 ml  Net  357.5 ml   Exam: General: No acute respiratory distress - alert but somewhat sluggish  Lungs: Clear to auscultation bilaterally without wheezes or crackles Cardiovascular: Regular rate and rhythm without murmur gallop or rub normal S1 and S2 Abdomen: Nontender, nondistended, soft, bowel sounds positive, no rebound, no ascites, no appreciable mass Extremities: No significant cyanosis, clubbing, or edema bilateral lower extremities  Data Reviewed:  Basic Metabolic Panel:  Recent Labs Lab 11/03/15 1032 11/03/15 1428 11/04/15 0907 11/05/15 0244  NA 140  --  139 138  K 4.0  --  3.5 3.0*  CL 109  --  109 111  CO2 22  --  20* 20*  GLUCOSE 151*  --  107* 108*  BUN 8  --  7 7  CREATININE 1.07  --  0.83 0.94  CALCIUM 8.4*  --  8.5* 8.6*  MG  --  1.8  --   --   PHOS  --  3.4  --   --     CBC:  Recent Labs Lab 11/03/15 1032 11/04/15 0116  WBC 9.9 10.1  NEUTROABS 7.2  --   HGB 13.4 12.4*  HCT 41.2 37.8*  MCV 86.9 85.9  PLT 230 208    Liver Function Tests:  Recent Labs Lab 11/03/15 1032  AST 63*  ALT 57  ALKPHOS 118  BILITOT 0.3  PROT 7.2  ALBUMIN 3.4*    Coags:  Recent Labs Lab 11/03/15 1428  INR 1.18  Recent Labs Lab 11/03/15 1428  APTT 29    Cardiac Enzymes:  Recent Labs Lab 11/03/15 1428 11/03/15 1834 11/04/15 0116  TROPONINI 0.56* 0.65* 0.26*    CBG:  Recent Labs Lab 11/04/15 1549 11/04/15 1948 11/05/15 0003 11/05/15 0406 11/05/15 0814  GLUCAP 78 97 99 102* 82    Recent Results (from the past 240 hour(s))  Blood culture (routine x 2)     Status: None (Preliminary result)   Collection Time: 11/03/15 10:35 AM  Result Value Ref Range Status   Specimen Description BLOOD RIGHT ANTECUBITAL  Final   Special Requests   Final    BOTTLES DRAWN AEROBIC AND ANAEROBIC 10CC AER 5CC ANA   Culture NO GROWTH 1 DAY  Final   Report Status PENDING  Incomplete  Blood culture (routine x 2)     Status: None  (Preliminary result)   Collection Time: 11/03/15 10:40 AM  Result Value Ref Range Status   Specimen Description BLOOD RIGHT HAND  Final   Special Requests BOTTLES DRAWN AEROBIC ONLY 10CC  Final   Culture NO GROWTH 1 DAY  Final   Report Status PENDING  Incomplete  MRSA PCR Screening     Status: None   Collection Time: 11/03/15  4:26 PM  Result Value Ref Range Status   MRSA by PCR NEGATIVE NEGATIVE Final    Comment:        The GeneXpert MRSA Assay (FDA approved for NASAL specimens only), is one component of a comprehensive MRSA colonization surveillance program. It is not intended to diagnose MRSA infection nor to guide or monitor treatment for MRSA infections.      Studies:   Recent x-ray studies have been reviewed in detail by the Attending Physician  Scheduled Meds:  Scheduled Meds: . carbamazepine  250 mg Oral TID  . cloBAZam  5 mg Oral TID  . lacosamide  100 mg Oral BID  . lacosamide  100 mg Oral Q lunch  . levETIRAcetam  1,500 mg Oral BID  . levothyroxine  25 mcg Oral QAC breakfast  . pantoprazole (PROTONIX) IV  40 mg Intravenous QHS  . piperacillin-tazobactam (ZOSYN)  IV  3.375 g Intravenous 3 times per day  . pneumococcal 23 valent vaccine  0.5 mL Intramuscular Tomorrow-1000  . rosuvastatin  10 mg Oral QHS  . topiramate  125 mg Per Tube BID    Time spent on care of this patient: 35 mins   MCCLUNG,JEFFREY T , MD   [facility] Office  [phone] Pager - Text Page per Shea Evans as per below:  On-Call/Text Page:      [email or web address]      password [email or web address]  If 7PM-7AM, please contact night-coverage [email or web address] Password [facility] 11/05/2015, 9:51 AM   LOS: 2 days

## 2015-11-05 NOTE — Progress Notes (Signed)
Interval History:                                                                                                                      [name] is an 30 y.o. male patient who presented with breakthrough seizures. He has a long history of poorly controlled epilepsy and multiple recurrent breakthrough seizures. His seizures typically associated with respiratory failure and sometimes cardiac arrest.  He is stable at this time no further seizures since admission. His Vimpat dose was increased to 200 mg in morning, 100 mg in the afternoon and 200 mg at bedtime. He has been followed by pediatric neurologist for several years and has been managing his seizure medications. Any changes to his seizure medicines are typically approved through his pediatric neurologist.  No new neurological symptoms at this time to address.    Past Medical History: Past Medical History  Diagnosis Date  . ALLERGIC RHINITIS 12/26/2009  . MITRAL VALVE PROLAPSE 06/22/2007  . SEIZURE DISORDER 06/22/2007  . Unspecified hypothyroidism 06/22/2007  . Growth disorder   . Mental retardation, mild (I.Q. 50-70)   . Brown's syndrome   . Heart murmur     moderate MR  . Sleep apnea     cpap , last sleep study 10/01/11  . Hypokalemia   . Hyperlipidemia     No therapy  . Blood clot in vein     Past Surgical History  Procedure Laterality Date  . Implantation vagal nerve stimulator      Left  . Portacath placement      and removal 04/08/12  . Eye surgery      X2 for Brown Syndrome    Family History: Family History  Problem Relation Age of Onset  . Breast cancer Mother   . Asthma Father   . Diabetes Father   . Coronary artery disease Maternal Grandfather   . Congestive Heart Failure Maternal Grandfather     Died at 55  . Pneumonia Maternal Grandfather   . Coronary artery disease Paternal Grandfather   . Lung cancer Paternal Grandfather     Died at 59  . Pneumonia Paternal Grandmother     Died at 82  .  Stroke Paternal Grandmother     Social History:   reports that he has never smoked. He has never used smokeless tobacco. He reports that he does not drink alcohol or use illicit drugs.  Allergies:  Allergies  Allergen Reactions  . Antihistamines, Chlorpheniramine-Type Other (See Comments)    Other Reaction: Dimetapp causes seizures  . Divalproex Sodium Other (See Comments)    Nausea, vomiting, liver and kidney dysfunction  . Phenytoin Rash and Other (See Comments)    Dilantin causes more seizures.   . Valproic Acid And Related Other (See Comments)    Shuts down systems  . Vancomycin Rash and Other (See Comments)    Other Reaction: Redman's Syndrome     Medications:  Current facility-administered medications:  .  acetaminophen (TYLENOL) tablet 1,000 mg, 1,000 mg, Oral, Q6H PRN, Cherene Altes, MD .  carbamazepine (TEGRETOL) chewable tablet 250 mg, 250 mg, Oral, TID, Drema Dallas, DO, 250 mg at 11/05/15 1553 .  cloBAZam ([name]) tablet 5 mg, 5 mg, Oral, TID, Drema Dallas, DO, 5 mg at 11/05/15 1554 .  diazepam (VALIUM) tablet 2 mg, 2 mg, Oral, Q6H PRN, Grace Bushy Minor, NP, 2 mg at 11/04/15 0132 .  [START ON 11/06/2015] enoxaparin (LOVENOX) injection 30 mg, 30 mg, Subcutaneous, Q24H, Cherene Altes, MD .  guaiFENesin tablet 400 mg, 400 mg, Oral, BID, Cherene Altes, MD, 400 mg at 11/05/15 1135 .  lacosamide (VIMPAT) tablet 100 mg, 100 mg, Oral, BID, Drema Dallas, DO, 100 mg at 11/05/15 0932 .  lacosamide (VIMPAT) tablet 100 mg, 100 mg, Oral, Q lunch, Drema Dallas, DO, 100 mg at 11/05/15 1135 .  levETIRAcetam (KEPPRA) 100 MG/ML solution 1,500 mg, 1,500 mg, Oral, BID, Raylene Miyamoto, MD, 1,500 mg at 11/05/15 0933 .  levothyroxine (SYNTHROID, LEVOTHROID) tablet 25 mcg, 25 mcg, Oral, QAC breakfast, Grace Bushy Minor, NP, 25 mcg at 11/05/15 0745 .   montelukast (SINGULAIR) tablet 10 mg, 10 mg, Oral, QHS, Cherene Altes, MD .  multivitamin with minerals tablet 1 tablet, 1 tablet, Oral, Daily, Cherene Altes, MD, 1 tablet at 11/05/15 1135 .  PHENObarbital (LUMINAL) injection 65 mg, 65 mg, Intravenous, Daily PRN, Drema Dallas, DO, 65 mg at 11/04/15 0119 .  rosuvastatin (CRESTOR) tablet 10 mg, 10 mg, Oral, QHS, William S Minor, NP, 10 mg at 11/04/15 2141 .  topiramate (TOPAMAX) tablet 125 mg, 125 mg, Per Tube, BID, Raylene Miyamoto, MD, 125 mg at 11/05/15 0932   Neurologic Examination:                                                                                                      Blood pressure 89/60, pulse 85, temperature 98.2 F (36.8 C), temperature source Oral, resp. rate 25, height 4\' 9"  (1.448 m), weight 48.762 kg (107 lb 8 oz), SpO2 97 %.  Evaluation of higher integrative functions including: Level of alertness: Alert,  Oriented to time, place and person Attention span and concentration  - intact   Speech: fluent, no evidence of dysarthria or aphasia noted.  Test the following cranial nerves: 2-12 grossly intact Motor examination:  full 5/5 motor strength in all 4 extremities  Lab Results: Basic Metabolic Panel:  Recent Labs Lab 11/03/15 1032 11/03/15 1428 11/04/15 0907 11/05/15 0244  NA 140  --  139 138  K 4.0  --  3.5 3.0*  CL 109  --  109 111  CO2 22  --  20* 20*  GLUCOSE 151*  --  107* 108*  BUN 8  --  7 7  CREATININE 1.07  --  0.83 0.94  CALCIUM 8.4*  --  8.5* 8.6*  MG  --  1.8  --   --   PHOS  --  3.4  --   --  Liver Function Tests:  Recent Labs Lab 11/03/15 1032  AST 63*  ALT 57  ALKPHOS 118  BILITOT 0.3  PROT 7.2  ALBUMIN 3.4*   No results for input(s): LIPASE, AMYLASE in the last 168 hours. No results for input(s): AMMONIA in the last 168 hours.  CBC:  Recent Labs Lab 11/03/15 1032 11/04/15 0116  WBC 9.9 10.1  NEUTROABS 7.2  --   HGB 13.4 12.4*  HCT 41.2 37.8*  MCV  86.9 85.9  PLT 230 208    Cardiac Enzymes:  Recent Labs Lab 11/03/15 1428 11/03/15 1834 11/04/15 0116  TROPONINI 0.56* 0.65* 0.26*    Lipid Panel: No results for input(s): CHOL, TRIG, HDL, CHOLHDL, VLDL, LDLCALC in the last 168 hours.  CBG:  Recent Labs Lab 11/05/15 0003 11/05/15 0406 11/05/15 0814 11/05/15 1210 11/05/15 1543  GLUCAP 99 102* 82 91 104*    Microbiology: Results for orders placed or performed during the hospital encounter of 11/03/15  Blood culture (routine x 2)     Status: None (Preliminary result)   Collection Time: 11/03/15 10:35 AM  Result Value Ref Range Status   Specimen Description BLOOD RIGHT ANTECUBITAL  Final   Special Requests   Final    BOTTLES DRAWN AEROBIC AND ANAEROBIC 10CC AER 5CC ANA   Culture NO GROWTH 2 DAYS  Final   Report Status PENDING  Incomplete  Blood culture (routine x 2)     Status: None (Preliminary result)   Collection Time: 11/03/15 10:40 AM  Result Value Ref Range Status   Specimen Description BLOOD RIGHT HAND  Final   Special Requests BOTTLES DRAWN AEROBIC ONLY 10CC  Final   Culture NO GROWTH 2 DAYS  Final   Report Status PENDING  Incomplete  MRSA PCR Screening     Status: None   Collection Time: 11/03/15  4:26 PM  Result Value Ref Range Status   MRSA by PCR NEGATIVE NEGATIVE Final    Comment:        The GeneXpert MRSA Assay (FDA approved for NASAL specimens only), is one component of a comprehensive MRSA colonization surveillance program. It is not intended to diagnose MRSA infection nor to guide or monitor treatment for MRSA infections.   Urine culture     Status: None   Collection Time: 11/04/15  2:18 AM  Result Value Ref Range Status   Specimen Description URINE, CLEAN CATCH  Final   Special Requests NONE  Final   Culture NO GROWTH 1 DAY  Final   Report Status 11/05/2015 FINAL  Final    Imaging: Dg Chest Port 1 View  11/04/2015  CLINICAL DATA:  Acute onset of shortness of breath. Initial  encounter. EXAM: PORTABLE CHEST 1 VIEW COMPARISON:  Chest radiograph performed 11/03/2015 FINDINGS: The patient's enteric tube is seen extending below the diaphragm. A right-sided chest port is noted ending about the distal SVC. Mild vascular congestion is noted. The lungs remain grossly clear. No pleural effusion or pneumothorax is seen. The cardiomediastinal silhouette is borderline normal in size. A metallic device is noted overlying the left side of the chest. No acute osseous abnormalities are seen. IMPRESSION: Mild vascular congestion noted.  Lungs remain grossly clear. Electronically Signed   By: Garald Balding M.D.   On: 11/04/2015 03:51   Dg Chest Portable 1 View  11/03/2015  CLINICAL DATA:  Cyanotic episode. History of seizures. CPR performed prior to arrival at hospital EXAM: PORTABLE CHEST 1 VIEW COMPARISON:  September 29, 2015 FINDINGS: [name] tip is  in the superior vena cava. No pneumothorax. There is a stimulator on the left extending to the left cervical -thoracic junction, stable. There is mild generalized interstitial edema. Lungs elsewhere clear. Heart size and pulmonary vascularity are normal. No adenopathy. No bone lesions. IMPRESSION: Mild generalized interstitial edema. Suspect noncardiogenic etiology given clinical history and the absence of cardiac enlargement. No airspace consolidation. No pneumothorax. Electronically Signed   By: Lowella Grip Perez M.D.   On: 11/03/2015 10:40   Dg Abd Portable 1v  11/04/2015  CLINICAL DATA:  Nasogastric tube placement.  Initial encounter. EXAM: PORTABLE ABDOMEN - 1 VIEW COMPARISON:  Abdominal radiograph performed 11/03/2015 FINDINGS: The patient's enteric tube is noted ending at the duodenum. The visualized bowel gas pattern is grossly unremarkable. No free intra-abdominal air is seen, though evaluation for free air is limited on a single supine view. No acute osseous abnormalities are identified. IMPRESSION: Enteric tube noted ending at the  duodenum. Electronically Signed   By: Garald Balding M.D.   On: 11/04/2015 03:40   Dg Abd Portable 1v  11/03/2015  CLINICAL DATA:  Check orogastric tube placement.  Initial encounter. EXAM: PORTABLE ABDOMEN - 1 VIEW COMPARISON:  Chest and abdominal radiographs performed 11/30/2012 FINDINGS: The patient's enteric tube is seen ending overlying the body of the stomach. The visualized bowel gas pattern is unremarkable. Scattered air and stool filled loops of colon are seen; no abnormal dilatation of small bowel loops is seen to suggest small bowel obstruction. No free intra-abdominal air is identified, though evaluation for free air is limited on a single supine view. The visualized osseous structures are within normal limits; the sacroiliac joints are unremarkable in appearance. The visualized lung bases are essentially clear. IMPRESSION: 1. Enteric tube seen ending overlying the body of the stomach. 2. Unremarkable bowel gas pattern; no free intra-abdominal air seen. Small to moderate amount of stool noted in the colon. Electronically Signed   By: Garald Balding M.D.   On: 11/03/2015 23:22    Assessment and plan:   [name] is an 30 y.o. male patient who presented following a break to seizure. His stable at this time with no further seizures since admission. Neurologically at baseline.  Vimpat dose was increased at admission, to 200 mg in morning, 100 mg in the afternoon and 200 mg at bedtime. His other seizure medication doses are unchanged.  He'll follow-up with his regular pediatric Neurologist. No further neurologic issues to address at this time.

## 2015-11-05 NOTE — Telephone Encounter (Signed)
I spoke with mother for 10 minutes.  I'm not certain if this was simply a respiratory arrest because his heart rate was 120  when ems evaluated him.  His family wants to have a second opinion at [facility].  We will try to arrange this.

## 2015-11-06 ENCOUNTER — Inpatient Hospital Stay (HOSPITAL_COMMUNITY)
Admit: 2015-11-06 | Discharge: 2015-11-06 | Disposition: A | Discharge status: Home / Self Care | Payer: Medicaid Other | Attending: Neurology | Admitting: Neurology

## 2015-11-06 ENCOUNTER — Encounter (HOSPITAL_COMMUNITY): Payer: Self-pay | Admitting: *Deleted

## 2015-11-06 DIAGNOSIS — Z87898 Personal history of other specified conditions: Secondary | ICD-10-CM

## 2015-11-06 DIAGNOSIS — G40909 Epilepsy, unspecified, not intractable, without status epilepticus: Secondary | ICD-10-CM

## 2015-11-06 DIAGNOSIS — J9602 Acute respiratory failure with hypercapnia: Secondary | ICD-10-CM

## 2015-11-06 DIAGNOSIS — G40319 Generalized idiopathic epilepsy and epileptic syndromes, intractable, without status epilepticus: Secondary | ICD-10-CM | POA: Insufficient documentation

## 2015-11-06 LAB — BASIC METABOLIC PANEL
ANION GAP: 11 (ref 5–15)
BUN: 8 mg/dL (ref 6–20)
CHLORIDE: 108 mmol/L (ref 101–111)
CO2: 20 mmol/L — AB (ref 22–32)
Calcium: 8.8 mg/dL — ABNORMAL LOW (ref 8.9–10.3)
Creatinine, Ser: 0.79 mg/dL (ref 0.61–1.24)
GFR calc non Af Amer: >60 mL/min (ref >60)
Glucose, Bld: 94 mg/dL (ref 65–99)
Potassium: 3.5 mmol/L (ref 3.5–5.1)
Sodium: 139 mmol/L (ref 135–145)

## 2015-11-06 LAB — CBC
HEMATOCRIT: 34.1 % — AB (ref 39.0–52.0)
HEMOGLOBIN: 11.4 g/dL — AB (ref 13.0–17.0)
MCH: 28.9 pg (ref 26.0–34.0)
MCHC: 33.4 g/dL (ref 30.0–36.0)
MCV: 86.3 fL (ref 78.0–100.0)
Platelets: 211 10*3/uL (ref 150–400)
RBC: 3.95 MIL/uL — AB (ref 4.22–5.81)
RDW: 13.5 % (ref 11.5–15.5)
WBC: 4.4 10*3/uL (ref 4.0–10.5)

## 2015-11-06 LAB — MAGNESIUM: Magnesium: 1.8 mg/dL (ref 1.7–2.4)

## 2015-11-06 MED ORDER — TOPIRAMATE ER 50 MG PO CAP24
100.0000 mg | ORAL_CAPSULE | Freq: Every day | ORAL | Status: DC
Start: 2015-11-06 — End: 2015-11-07
  Administered 2015-11-06: 100 mg via ORAL
  Filled 2015-11-06: qty 2

## 2015-11-06 MED ORDER — POTASSIUM CHLORIDE 20 MEQ/15ML (10%) PO SOLN
40.0000 meq | Freq: Once | ORAL | Status: AC
  Administered 2015-11-06: 40 meq via ORAL
  Filled 2015-11-06: qty 30

## 2015-11-06 MED ORDER — SODIUM CHLORIDE 0.9% FLUSH
10.0000 mL | INTRAVENOUS | Status: DC | PRN
  Administered 2015-11-07: 10 mL
  Filled 2015-11-06: qty 40

## 2015-11-06 MED ORDER — TOPIRAMATE ER 200 MG PO CAP24: 300.0000 mg | ORAL_CAPSULE | Freq: Every day | ORAL | Status: DC

## 2015-11-06 MED ORDER — TOPIRAMATE ER 200 MG PO CAP24
200.0000 mg | ORAL_CAPSULE | Freq: Every day | ORAL | Status: DC
  Administered 2015-11-06: 200 mg via ORAL
  Filled 2015-11-06: qty 1

## 2015-11-06 MED ORDER — MAGNESIUM SULFATE IN D5W 10-5 MG/ML-% IV SOLN
1.0000 g | Freq: Once | INTRAVENOUS | Status: AC
  Administered 2015-11-06: 1 g via INTRAVENOUS
  Filled 2015-11-06: qty 100

## 2015-11-06 MED ORDER — SODIUM CHLORIDE 0.9% FLUSH: 10.0000 mL | Freq: Two times a day (BID) | INTRAVENOUS | Status: DC

## 2015-11-06 NOTE — Clinical Documentation Improvement (Addendum)
Hospitalist  (Please do not document query responses on the CDI BPA form.  Query responses must documented in the current medical record.)  "Musculoskletal: LE contractures" is documented in the CCM H&P 11/03/15 and 11/04/15.  To assist with accurate code assignment, please document the specific joints that have contractures, including laterality.  Clinical Information/Indicators: "Musculoskeletal: le contractures" documented in CCM H&P dated 11/03/15 and 11/04/15    Please exercise your independent, professional judgment when responding. A specific answer is not anticipated or expected.   Thank You, Erling Conte  RN BSN CCDS [phone] Health Information Management [place]

## 2015-11-06 NOTE — Progress Notes (Signed)
EEG Completed; Results Pending  

## 2015-11-06 NOTE — Progress Notes (Signed)
OT Cancellation Note  Patient Details Name: [name] MRN: [id number] DOB: 08-19-1986   Cancelled Treatment:    Reason Eval/Treat Not Completed: OT screened, no needs identified, will sign off. According to PT, no OT needs identified at this time. PT talked with family about OT consult and family feels patient is close to functional baseline. House is set-up and handicap accessible. Will sign off at this time. IF any OT needs arise, please re-order OT. Thank you for the initial order.   Chrys Racer , MS, OTR/L, CLT Pager: [phone]  11/06/2015, 4:15 PM

## 2015-11-06 NOTE — Procedures (Signed)
ELECTROENCEPHALOGRAM REPORT  Patient: Arlene Ivers Pennel [name]       Room #: [id number] EEG No. ID: [id number] Age: 30 y.o.        Sex: male Referring Physician: [facility], Mamie Nick Report Date:  11/06/2015        Interpreting Physician: Anthony Sar  History: Zamauri Shertzer Clear [name] is an 30 y.o. 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 Patient is being treated with multiple medications and has an implanted vagus stimulator.  Indications for study:  Rule out seizure activity.  Technique: This is an 18 channel routine scalp EEG performed at the bedside with bipolar and monopolar montages arranged in accordance to the international 10/20 system of electrode placement.   Description: This EEG recording was performed during wakefulness. Patient was interactive and in no distress at the time of this recording. Predominant activity consisted of diffuse low amplitude irregular symmetrical 1-2 Hz delta activity with superimposed low amplitude 12-13 Hz diffuse rhythmic activity, as well as rhythmic 6-7 Hz theta activity recorded from the posterior head region. Photic stimulation was not performed. No epileptiform discharges were recorded.  Interpretation: This EEG is abnormal with mild generalized nonspecific continuous slowing of cerebral activity, which is consistent with patient's history of static encephalopathy. No evidence of seizure activity was recorded.   Rush Farmer M.D. [facility] [phone]

## 2015-11-06 NOTE — Evaluation (Signed)
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 In there ER he was awake and interactive and in no distress. Note despite vagus nerve stimulator/anti szs medications he has szs activity approprioximately 2x a week.  Clinical Impression  Pt admitted with above diagnosis. Pt currently with functional limitations due to the deficits listed below (see PT Problem List).  Pt will benefit from skilled PT to increase their independence and safety with mobility to allow discharge to the venue listed below. Patient currently demonstrating significant instability with ambulation. Family reports that the patient is near baseline but generally more unsteady currently. The patient's parents are present and supportive throughout the session and it is anticipated that the patient will D/C to home with their assistance. PT to follow and progress ambulation as tolerated prior to D/C home.       Follow Up Recommendations No PT follow up;Supervision/Assistance - 24 hour    Equipment Recommendations  None recommended by PT    Recommendations for Other Services       Precautions / Restrictions Precautions Precautions: Fall Precaution Comments: vagal nerve stimulator Required Braces or Orthoses: Other Brace/Splint Other Brace/Splint: Lt ankle brace and hightop shoes Restrictions Weight Bearing Restrictions: No      Mobility  Bed Mobility Overal bed mobility: Needs Assistance Bed Mobility: Supine to Sit     Supine to sit: HOB elevated;Supervision (HOB approx 30 degrees)        Transfers Overall transfer level: Needs assistance Equipment used: None Transfers: Sit to/from Stand Sit to Stand: Min assist;Mod assist          General transfer comment: min assist with actual transfer but mod assist needed for balance once standing.   Ambulation/Gait Ambulation/Gait assistance: Min assist;Mod assist Ambulation Distance (Feet): 110 Feet Assistive device: None   Gait velocity: decreased   General Gait Details: Patient demonstrating unsteady gait pattern, requiring min-mod assistance for balance. Noted general instability but patient had several significant LOB with mod assist to recover. Two hand assistance needed. Patient wearing ankle brace and hightop shoe on Lt, Unable to achieve heel contact due to Lt ankle contracture.   Stairs            Wheelchair Mobility    Modified Rankin (Stroke Patients Only)       Balance Overall balance assessment: Needs assistance Sitting-balance support: No upper extremity supported Sitting balance-Leahy Scale: Good     Standing balance support: No upper extremity supported Standing balance-Leahy Scale: Poor Standing balance comment: Patient requiring physical assistance to maintain balance during ambulation and static standing.                              Pertinent Vitals/Pain Pain Assessment: No/denies pain    Home Living Family/patient expects to be discharged to:: Private residence Living Arrangements: Parent Available Help at Discharge: Family;Available 24 hours/day Type of Home: House Home Access: Stairs to enter Entrance Stairs-Rails: Right Entrance Stairs-Number of Steps: 3 Home Layout: One level Home Equipment: Walker - 2 wheels;Wheelchair - manual      Prior Function Level of Independence: Needs assistance   Gait / Transfers Assistance Needed: family reports assisting patient with ambulation around the home. Used  w/c for community distances. Able to ambulate up steps at home with assistance.            Hand Dominance        Extremity/Trunk Assessment   Upper Extremity Assessment: Overall WFL for tasks assessed            Lower Extremity Assessment: Generalized weakness   LLE Deficits / Details: history of LT ankle contracture into plantarflexion     Communication   Communication: Expressive difficulties  Cognition Arousal/Alertness: Awake/alert Behavior During Therapy: WFL for tasks assessed/performed Overall Cognitive Status: History of cognitive impairments - at baseline                      General Comments General comments (skin integrity, edema, etc.): Parents present for session and reporting that the patient is more unstable then usual during ambulation.     Exercises        Assessment/Plan    PT Assessment Patient needs continued PT services  PT Diagnosis Difficulty walking;Abnormality of gait   PT Problem List Decreased range of motion;Decreased strength;Decreased activity tolerance;Decreased balance;Decreased mobility;Decreased safety awareness  PT Treatment Interventions DME instruction;Gait training;Stair training;Functional mobility training;Therapeutic activities;Therapeutic exercise;Balance training;Patient/family education   PT Goals (Current goals can be found in the Care Plan section) Acute Rehab PT Goals Patient Stated Goal: be able to go home PT Goal Formulation: With patient/family Time For Goal Achievement: 11/20/15 Potential to Achieve Goals: Good    Frequency Min 3X/week   Barriers to discharge        Co-evaluation               End of Session Equipment Utilized During Treatment: Gait belt Activity Tolerance: Patient tolerated treatment well Patient left: in chair;with call bell/phone within reach;with family/visitor present;Other (comment) (family report patient will not be left unattended. ) Nurse Communication: Mobility status         Time: [id number] PT Time Calculation (min) (ACUTE ONLY): 24 min   Charges:   PT Evaluation $PT Eval Moderate Complexity: 1 Procedure PT Treatments $Gait Training: 8-22 mins   PT G Codes:         Cassell Clement, PT, CSCS Pager [phone] Office [phone]  11/06/2015, 4:06 PM

## 2015-11-06 NOTE — Progress Notes (Signed)
PROGRESS NOTE  [place] Nurse Flagler [name] [id number] DOB: 04/06/1986 DOA: 11/03/2015 PCP: Chesley Noon, MD  Admit HPI / Brief Narrative:  30 yo mentally handcap M with severe refractory Szs disorder who had 6 minutes of CPR at home for suspected arrest (most likely postictal) which EMS did not continue. In there ER he was awake and interactive and in no distress. Note despite vagus nerve stimulator/anti szs medications he has szs activity approprioximately 2x a week.  HPI/Subjective:  The patient is resting comfortably in bed.  He voices no complaints himself.  According to his parents he is back to his usual self, he denies any headache, no chest pain, no shortness of breath, no abdominal pain or any weakness.  Assessment/Plan:  Szs Disorder (complex and refractory) followed by Dr. Gaynell Face - med dosing being adjusted by Neuro - he is currently on 5 different antiseizure medications, we will defer management of this problem to neurology, repeat EEG to be done on 11/06/2015. [facility] patient appears to be close to baseline. Per family he has 1-2 breakthrough seizures every week.  Post arrest - likely resp distress post sz No evidence of an actual cardiac arrest   Hypokalemia  Due to poor intake - replaced continue to monitor with magnesium levels.  OSA on CPAP at night.  Chest contusion from CPR with mild non-ACS pattern troponin rise post CPR Denies chest pain today , EKG was nonacute.  Presumed aspiration Has been afebrile th/o hospital stay - WBC normal - was on Zosyn which was stopped on 11/05/2015, monitor clinically  Mental handicap  Parents are his caregivers and appear very attentive to his needs/care    Code Status: FULL Family Communication: Parents bedside Disposition Plan: Discharge home once stable per neurology, repeat EEG today, will anticipate discharge in 1-2 days.   Consultants: Neuro  PCCM  Procedures: none  Antibiotics: Zosyn 2/11 >  2/13  DVT prophylaxis: lovenox   Objective: Blood pressure 105/77, pulse 77, temperature 98.1 F (36.7 C), temperature source Axillary, resp. rate 18, height 4\' 9"  (1.448 m), weight 48.762 kg (107 lb 8 oz), SpO2 100 %.  Intake/Output Summary (Last 24 hours) at 11/06/15 1051 Last data filed at 11/05/15 2040  Gross per 24 hour  Intake    280 ml  Output    375 ml  Net    -95 ml   Exam: General: No acute respiratory distress - alert but somewhat sluggish  Lungs: Clear to auscultation bilaterally without wheezes or crackles Cardiovascular: Regular rate and rhythm without murmur gallop or rub normal S1 and S2 Abdomen: Nontender, nondistended, soft, bowel sounds positive, no rebound, no ascites, no appreciable mass Extremities: No significant cyanosis, clubbing, or edema bilateral lower extremities  Data Reviewed:  Basic Metabolic Panel:  Recent Labs Lab 11/03/15 1032 11/03/15 1428 11/04/15 0907 11/05/15 0244 11/06/15 0644  NA 140  --  139 138 139  K 4.0  --  3.5 3.0* 3.5  CL 109  --  109 111 108  CO2 22  --  20* 20* 20*  GLUCOSE 151*  --  107* 108* 94  BUN 8  --  7 7 8   CREATININE 1.07  --  0.83 0.94 0.79  CALCIUM 8.4*  --  8.5* 8.6* 8.8*  MG  --  1.8  --   --  1.8  PHOS  --  3.4  --   --   --     CBC:  Recent Labs Lab 11/03/15 1032 11/04/15 0116  11/06/15 0644  WBC 9.9 10.1 4.4  NEUTROABS 7.2  --   --   HGB 13.4 12.4* 11.4*  HCT 41.2 37.8* 34.1*  MCV 86.9 85.9 86.3  PLT 230 208 211    Liver Function Tests:  Recent Labs Lab 11/03/15 1032  AST 63*  ALT 57  ALKPHOS 118  BILITOT 0.3  PROT 7.2  ALBUMIN 3.4*    Coags:  Recent Labs Lab 11/03/15 1428  INR 1.18    Recent Labs Lab 11/03/15 1428  APTT 29    Cardiac Enzymes:  Recent Labs Lab 11/03/15 1428 11/03/15 1834 11/04/15 0116  TROPONINI 0.56* 0.65* 0.26*    CBG:  Recent Labs Lab 11/05/15 0003 11/05/15 0406 11/05/15 0814 11/05/15 1210 11/05/15 1543  GLUCAP 99 102* 82 91  104*    Recent Results (from the past 240 hour(s))  Blood culture (routine x 2)     Status: None (Preliminary result)   Collection Time: 11/03/15 10:35 AM  Result Value Ref Range Status   Specimen Description BLOOD RIGHT ANTECUBITAL  Final   Special Requests   Final    BOTTLES DRAWN AEROBIC AND ANAEROBIC 10CC AER 5CC ANA   Culture NO GROWTH 2 DAYS  Final   Report Status PENDING  Incomplete  Blood culture (routine x 2)     Status: None (Preliminary result)   Collection Time: 11/03/15 10:40 AM  Result Value Ref Range Status   Specimen Description BLOOD RIGHT HAND  Final   Special Requests BOTTLES DRAWN AEROBIC ONLY 10CC  Final   Culture NO GROWTH 2 DAYS  Final   Report Status PENDING  Incomplete  MRSA PCR Screening     Status: None   Collection Time: 11/03/15  4:26 PM  Result Value Ref Range Status   MRSA by PCR NEGATIVE NEGATIVE Final    Comment:        The GeneXpert MRSA Assay (FDA approved for NASAL specimens only), is one component of a comprehensive MRSA colonization surveillance program. It is not intended to diagnose MRSA infection nor to guide or monitor treatment for MRSA infections.   Urine culture     Status: None   Collection Time: 11/04/15  2:18 AM  Result Value Ref Range Status   Specimen Description URINE, CLEAN CATCH  Final   Special Requests NONE  Final   Culture NO GROWTH 1 DAY  Final   Report Status 11/05/2015 FINAL  Final     Studies:   Recent x-ray studies have been reviewed in detail by the Attending Physician  Scheduled Meds:  Scheduled Meds: . carbamazepine  250 mg Oral TID  . cloBAZam  5 mg Oral TID  . enoxaparin (LOVENOX) injection  30 mg Subcutaneous Q24H  . guaiFENesin  400 mg Oral BID  . lacosamide  100 mg Oral BID  . lacosamide  100 mg Oral Q lunch  . levETIRAcetam  1,500 mg Oral BID  . levothyroxine  25 mcg Oral QAC breakfast  . montelukast  10 mg Oral QHS  . multivitamin with minerals  1 tablet Oral Daily  . rosuvastatin  10 mg  Oral QHS  . sodium chloride flush  10-40 mL Intracatheter Q12H  . topiramate  125 mg Per Tube BID    Time spent on care of this patient: 35 mins  Signature  Thurnell Lose M.D on 11/06/2015 at 10:52 AM  Between 7am to 7pm - Pager - [phone], After 7pm go to [email or web address] - password [email or web address]  [facility]  -  Office  [phone]   LOS: 3 days

## 2015-11-06 NOTE — Progress Notes (Signed)
Interval history  :                                                                                                                                       Tyee Hoggard Figgs [name] is an 30 y.o. male patient who was admitted with  with breakthrough seizures. He has a history of mild mental retardation, and intractable epilepsy, , multiple admissions for breakthrough seizures due to respiratory and cardiac arrest during the seizures. He has remained stable with no further seizures since admission.  His Vimpat dose was increased during this hospitalization to 200 mg in morning, 100 mg in the afternoon and 200 mg at bedtime.  His tolerating well without side effects. No new neurological symptoms to address.   Past medical history: Past Medical History  Diagnosis Date  . ALLERGIC RHINITIS 12/26/2009  . MITRAL VALVE PROLAPSE 06/22/2007  . SEIZURE DISORDER 06/22/2007  . Unspecified hypothyroidism 06/22/2007  . Growth disorder   . Mental retardation, mild (I.Q. 50-70)   . Brown's syndrome   . Heart murmur     moderate MR  . Sleep apnea     cpap , last sleep study 10/01/11  . Hypokalemia   . Hyperlipidemia     No therapy  . Blood clot in vein     Past Surgical History  Procedure Laterality Date  . Implantation vagal nerve stimulator      Left  . Portacath placement      and removal 04/08/12  . Eye surgery      X2 for Brown Syndrome    Family History: Family History  Problem Relation Age of Onset  . Breast cancer Mother   . Asthma Father   . Diabetes Father   . Coronary artery disease Maternal Grandfather   . Congestive Heart Failure Maternal Grandfather     Died at 18  . Pneumonia Maternal Grandfather   . Coronary artery disease Paternal Grandfather   . Lung cancer Paternal Grandfather     Died at 43  . Pneumonia Paternal Grandmother     Died at 47  . Stroke Paternal Grandmother     Social History:   reports that he has never smoked. He has never used smokeless tobacco. He  reports that he does not drink alcohol or use illicit drugs.  Allergies:  Allergies  Allergen Reactions  . Antihistamines, Chlorpheniramine-Type Other (See Comments)    Other Reaction: Dimetapp causes seizures  . Divalproex Sodium Other (See Comments)    Nausea, vomiting, liver and kidney dysfunction  . Phenytoin Rash and Other (See Comments)    Dilantin causes more seizures.   . Valproic Acid And Related Other (See Comments)    Shuts down systems  . Vancomycin Rash and Other (See Comments)    Other Reaction: Redman's Syndrome    Medications:   Current facility-administered medications:  .  acetaminophen (TYLENOL) tablet 1,000 mg,  1,000 mg, Oral, Q6H PRN, Cherene Altes, MD .  carbamazepine (TEGRETOL) chewable tablet 250 mg, 250 mg, Oral, TID, Drema Dallas, DO, 250 mg at 11/06/15 2124 .  cloBAZam ([name]) tablet 5 mg, 5 mg, Oral, TID, Drema Dallas, DO, 5 mg at 11/06/15 2125 .  diazepam (VALIUM) tablet 2 mg, 2 mg, Oral, Q6H PRN, Grace Bushy Minor, NP, 2 mg at 11/04/15 0132 .  enoxaparin (LOVENOX) injection 30 mg, 30 mg, Subcutaneous, Q24H, Cherene Altes, MD, 30 mg at 11/06/15 1058 .  guaiFENesin tablet 400 mg, 400 mg, Oral, BID, Cherene Altes, MD, 400 mg at 11/06/15 2125 .  lacosamide (VIMPAT) tablet 100 mg, 100 mg, Oral, BID, Drema Dallas, DO, 100 mg at 11/06/15 2124 .  lacosamide (VIMPAT) tablet 100 mg, 100 mg, Oral, Q lunch, Drema Dallas, DO, 100 mg at 11/06/15 1213 .  levETIRAcetam (KEPPRA) 100 MG/ML solution 1,500 mg, 1,500 mg, Oral, BID, Raylene Miyamoto, MD, 1,500 mg at 11/06/15 2124 .  levothyroxine (SYNTHROID, LEVOTHROID) tablet 25 mcg, 25 mcg, Oral, QAC breakfast, Grace Bushy Minor, NP, 25 mcg at 11/06/15 0817 .  montelukast (SINGULAIR) tablet 10 mg, 10 mg, Oral, QHS, Cherene Altes, MD, 10 mg at 11/06/15 2124 .  multivitamin with minerals tablet 1 tablet, 1 tablet, Oral, Daily, Cherene Altes, MD, 1 tablet at 11/06/15 1057 .  PHENObarbital (LUMINAL)  injection 65 mg, 65 mg, Intravenous, Daily PRN, Drema Dallas, DO, 65 mg at 11/04/15 0119 .  rosuvastatin (CRESTOR) tablet 10 mg, 10 mg, Oral, QHS, William S Minor, NP, 10 mg at 11/06/15 2125 .  sodium chloride flush (NS) 0.9 % injection 10-40 mL, 10-40 mL, Intracatheter, Q12H, Thurnell Lose, MD, 10 mL at 11/06/15 1000 .  sodium chloride flush (NS) 0.9 % injection 10-40 mL, 10-40 mL, Intracatheter, PRN, Thurnell Lose, MD .  Topiramate ER CP24 200 mg, 200 mg, Oral, QHS, 200 mg at 11/06/15 2123 **AND** Topiramate ER CP24 100 mg, 100 mg, Oral, QHS, Thurnell Lose, MD, 100 mg at 11/06/15 2123   Neurologic Examination:                                                                                                      Blood pressure 105/70, pulse 85, temperature 98.3 F (36.8 C), temperature source Oral, resp. rate 17, height 4\' 9"  (1.448 m), weight 48.762 kg (107 lb 8 oz), SpO2 97 %.  Evaluation of higher integrative functions including: Level of alertness: Alert,  Oriented to time, place and person Attention span and concentration  - intact   Speech: fluent, no evidence of dysarthria or aphasia noted.  Test the following cranial nerves: 2-12 grossly intact Motor examination: , full 5/5 motor strength in all 4 extremities    Lab Results: Basic Metabolic Panel:  Recent Labs Lab 11/03/15 1032 11/03/15 1428 11/04/15 0907 11/05/15 0244 11/06/15 0644  NA 140  --  139 138 139  K 4.0  --  3.5 3.0* 3.5  CL 109  --  109 111 108  CO2 22  --  20* 20* 20*  GLUCOSE 151*  --  107* 108* 94  BUN 8  --  7 7 8   CREATININE 1.07  --  0.83 0.94 0.79  CALCIUM 8.4*  --  8.5* 8.6* 8.8*  MG  --  1.8  --   --  1.8  PHOS  --  3.4  --   --   --     Liver Function Tests:  Recent Labs Lab 11/03/15 1032  AST 63*  ALT 57  ALKPHOS 118  BILITOT 0.3  PROT 7.2  ALBUMIN 3.4*   No results for input(s): LIPASE, AMYLASE in the last 168 hours. No results for input(s): AMMONIA in the last 168  hours.  CBC:  Recent Labs Lab 11/03/15 1032 11/04/15 0116 11/06/15 0644  WBC 9.9 10.1 4.4  NEUTROABS 7.2  --   --   HGB 13.4 12.4* 11.4*  HCT 41.2 37.8* 34.1*  MCV 86.9 85.9 86.3  PLT 230 208 211    Cardiac Enzymes:  Recent Labs Lab 11/03/15 1428 11/03/15 1834 11/04/15 0116  TROPONINI 0.56* 0.65* 0.26*    Lipid Panel: No results for input(s): CHOL, TRIG, HDL, CHOLHDL, VLDL, LDLCALC in the last 168 hours.  CBG:  Recent Labs Lab 11/05/15 0003 11/05/15 0406 11/05/15 0814 11/05/15 1210 11/05/15 1543  GLUCAP 99 102* 20 91 104*    Microbiology: Results for orders placed or performed during the hospital encounter of 11/03/15  Blood culture (routine x 2)     Status: None (Preliminary result)   Collection Time: 11/03/15 10:35 AM  Result Value Ref Range Status   Specimen Description BLOOD RIGHT ANTECUBITAL  Final   Special Requests   Final    BOTTLES DRAWN AEROBIC AND ANAEROBIC 10CC AER 5CC ANA   Culture NO GROWTH 3 DAYS  Final   Report Status PENDING  Incomplete  Blood culture (routine x 2)     Status: None (Preliminary result)   Collection Time: 11/03/15 10:40 AM  Result Value Ref Range Status   Specimen Description BLOOD RIGHT HAND  Final   Special Requests BOTTLES DRAWN AEROBIC ONLY 10CC  Final   Culture NO GROWTH 3 DAYS  Final   Report Status PENDING  Incomplete  MRSA PCR Screening     Status: None   Collection Time: 11/03/15  4:26 PM  Result Value Ref Range Status   MRSA by PCR NEGATIVE NEGATIVE Final    Comment:        The GeneXpert MRSA Assay (FDA approved for NASAL specimens only), is one component of a comprehensive MRSA colonization surveillance program. It is not intended to diagnose MRSA infection nor to guide or monitor treatment for MRSA infections.   Urine culture     Status: None   Collection Time: 11/04/15  2:18 AM  Result Value Ref Range Status   Specimen Description URINE, CLEAN CATCH  Final   Special Requests NONE  Final    Culture NO GROWTH 1 DAY  Final   Report Status 11/05/2015 FINAL  Final     Imaging: Dg Chest Port 1 View  11/04/2015  CLINICAL DATA:  Acute onset of shortness of breath. Initial encounter. EXAM: PORTABLE CHEST 1 VIEW COMPARISON:  Chest radiograph performed 11/03/2015 FINDINGS: The patient's enteric tube is seen extending below the diaphragm. A right-sided chest port is noted ending about the distal SVC. Mild vascular congestion is noted. The lungs remain grossly clear. No pleural effusion or pneumothorax is seen. The cardiomediastinal silhouette is borderline normal in size.  A metallic device is noted overlying the left side of the chest. No acute osseous abnormalities are seen. IMPRESSION: Mild vascular congestion noted.  Lungs remain grossly clear. Electronically Signed   By: Garald Balding M.D.   On: 11/04/2015 03:51   Dg Chest Portable 1 View  11/03/2015  CLINICAL DATA:  Cyanotic episode. History of seizures. CPR performed prior to arrival at hospital EXAM: PORTABLE CHEST 1 VIEW COMPARISON:  September 29, 2015 FINDINGS: [name] tip is in the superior vena cava. No pneumothorax. There is a stimulator on the left extending to the left cervical -thoracic junction, stable. There is mild generalized interstitial edema. Lungs elsewhere clear. Heart size and pulmonary vascularity are normal. No adenopathy. No bone lesions. IMPRESSION: Mild generalized interstitial edema. Suspect noncardiogenic etiology given clinical history and the absence of cardiac enlargement. No airspace consolidation. No pneumothorax. Electronically Signed   By: Lowella Grip [name] M.D.   On: 11/03/2015 10:40   Dg Abd Portable 1v  11/04/2015  CLINICAL DATA:  Nasogastric tube placement.  Initial encounter. EXAM: PORTABLE ABDOMEN - 1 VIEW COMPARISON:  Abdominal radiograph performed 11/03/2015 FINDINGS: The patient's enteric tube is noted ending at the duodenum. The visualized bowel gas pattern is grossly unremarkable. No free  intra-abdominal air is seen, though evaluation for free air is limited on a single supine view. No acute osseous abnormalities are identified. IMPRESSION: Enteric tube noted ending at the duodenum. Electronically Signed   By: Garald Balding M.D.   On: 11/04/2015 03:40   Dg Abd Portable 1v  11/03/2015  CLINICAL DATA:  Check orogastric tube placement.  Initial encounter. EXAM: PORTABLE ABDOMEN - 1 VIEW COMPARISON:  Chest and abdominal radiographs performed 11/30/2012 FINDINGS: The patient's enteric tube is seen ending overlying the body of the stomach. The visualized bowel gas pattern is unremarkable. Scattered air and stool filled loops of colon are seen; no abnormal dilatation of small bowel loops is seen to suggest small bowel obstruction. No free intra-abdominal air is identified, though evaluation for free air is limited on a single supine view. The visualized osseous structures are within normal limits; the sacroiliac joints are unremarkable in appearance. The visualized lung bases are essentially clear. IMPRESSION: 1. Enteric tube seen ending overlying the body of the stomach. 2. Unremarkable bowel gas pattern; no free intra-abdominal air seen. Small to moderate amount of stool noted in the colon. Electronically Signed   By: Garald Balding M.D.   On: 11/03/2015 23:22     Assessment and plan:   Jadrian Murphy Juncaj [name] is an 30 y.o. male patient who was admitted with breakthrough seizures. He has a history of mild mental retardation, and intractable epilepsy, , multiple admissions for breakthrough seizures due to respiratory and cardiac arrest during the seizures. He has remained stable with no further seizures since admission.  His Vimpat dose was increased during this hospitalization to 200 mg in morning, 100 mg in the afternoon and 200 mg at bedtime.  His tolerating well without side effects. No new neurological symptoms to address.  Patient's parents requested information about a possible second  opinion at [facility] neurology with regard to his seizure medication management. I gave them names of epileptologists at [facility], and advised to call their office for appointments.  Otherwise no other new neurological issues to address at this time. Neurology will sign off. Please call if any further questions

## 2015-11-07 LAB — BASIC METABOLIC PANEL
Anion gap: 11 (ref 5–15)
BUN: 6 mg/dL (ref 6–20)
CALCIUM: 8.9 mg/dL (ref 8.9–10.3)
CHLORIDE: 109 mmol/L (ref 101–111)
CO2: 21 mmol/L — ABNORMAL LOW (ref 22–32)
CREATININE: 0.86 mg/dL (ref 0.61–1.24)
GFR calc Af Amer: >60 mL/min (ref >60)
GFR calc non Af Amer: >60 mL/min (ref >60)
Glucose, Bld: 93 mg/dL (ref 65–99)
Potassium: 3.5 mmol/L (ref 3.5–5.1)
SODIUM: 141 mmol/L (ref 135–145)

## 2015-11-07 LAB — MAGNESIUM: MAGNESIUM: 1.9 mg/dL (ref 1.7–2.4)

## 2015-11-07 MED ORDER — LACOSAMIDE 100 MG PO TABS: 100.0000 mg | ORAL_TABLET | Freq: Every day | ORAL | Status: DC

## 2015-11-07 MED ORDER — CARBAMAZEPINE 100 MG PO CHEW
250.0000 mg | CHEWABLE_TABLET | Freq: Three times a day (TID) | ORAL | Status: DC
Start: 2015-11-07 — End: 2016-01-07

## 2015-11-07 MED ORDER — CLOBAZAM 5 MG PO TABS
5.0000 mg | ORAL_TABLET | Freq: Three times a day (TID) | ORAL | Status: DC
Start: 2015-11-07 — End: 2015-11-09

## 2015-11-07 MED ORDER — HEPARIN SOD (PORK) LOCK FLUSH 100 UNIT/ML IV SOLN
500.0000 [IU] | INTRAVENOUS | Status: AC | PRN
  Administered 2015-11-07: 500 [IU]

## 2015-11-07 MED ORDER — LACOSAMIDE 100 MG PO TABS: 100.0000 mg | ORAL_TABLET | Freq: Two times a day (BID) | ORAL | Status: DC

## 2015-11-07 MED ORDER — TOPIRAMATE ER 50 MG PO CAP24: 100.0000 mg | ORAL_CAPSULE | Freq: Every day | ORAL | Status: DC

## 2015-11-07 MED ORDER — TOPIRAMATE ER 200 MG PO CAP24: 200.0000 mg | ORAL_CAPSULE | Freq: Every day | ORAL | Status: DC

## 2015-11-07 NOTE — Progress Notes (Signed)
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 In there ER he was awake and interactive and in no distress. Note despite vagus nerve stimulator/anti szs medications he has szs activity approprioximately 2x a week.    PT Comments    Pt did well with RW with A to steer it.  Mother educated on how to A.  Discussed RW vs. HHA at home and they are going to see how he does with both in his own environment.  Family is always with him.  Follow Up Recommendations  No PT follow up;Supervision/Assistance - 24 hour     Equipment Recommendations  None recommended by PT    Recommendations for Other Services       Precautions / Restrictions Precautions Precautions: Fall Precaution Comments: vagal nerve stimulator Required Braces or Orthoses: Other Brace/Splint Other Brace/Splint: Lt ankle brace and hightop shoes Restrictions Weight Bearing Restrictions: No    Mobility  Bed Mobility Overal bed mobility: Needs Assistance Bed Mobility: Supine to Sit     Supine to sit: HOB elevated;Supervision        Transfers Overall transfer level: Needs assistance Equipment used: None Transfers: Sit to/from Stand Sit to Stand: Min assist         General transfer comment: MIN A with wide BOS  Ambulation/Gait Ambulation/Gait assistance: Min assist Ambulation Distance (Feet): 150 Feet Assistive device: Rolling walker (2 wheeled) Gait Pattern/deviations: Drifts right/left Gait velocity: decreased   General Gait Details: Cues for proper technique.  Tends to drift R.  PT assisted with steering throughout gait cycle with mother present and educated.  No overt LOB like yesterday.  Pt taking hands off of RW at  times.   Stairs            Wheelchair Mobility    Modified Rankin (Stroke Patients Only)       Balance Overall balance assessment: Needs assistance Sitting-balance support: Feet supported Sitting balance-Leahy Scale: Good       Standing balance-Leahy Scale: Poor Standing balance comment: requires UE support during gait and close min/guard during static standing                    Cognition Arousal/Alertness: Awake/alert Behavior During Therapy: WFL for tasks assessed/performed Overall Cognitive Status: History of cognitive impairments - at baseline                      Exercises      General Comments General comments (skin integrity, edema, etc.): parents present and supportive.  Discussed RW vs. HHA at home.  Instructed how to adjust RW height.      Pertinent Vitals/Pain Pain Assessment: No/denies pain    Home Living                      Prior Function            PT Goals (current goals can now be found in the care plan section) Acute Rehab PT Goals Patient Stated Goal: be able to go home PT Goal Formulation: With patient/family Time For Goal Achievement: 11/20/15 Potential to Achieve Goals: Good Progress towards PT goals: Progressing toward goals    Frequency  Min 3X/week  PT Plan Current plan remains appropriate    Co-evaluation             End of Session Equipment Utilized During Treatment: Gait belt Activity Tolerance: Patient tolerated treatment well Patient left: in bed;with call bell/phone within reach;with family/visitor present     Time: [id number] PT Time Calculation (min) (ACUTE ONLY): 17 min  Charges:  $Gait Training: 8-22 mins                    G Codes:      Krysten Veronica LUBECK 11/07/2015, 11:51 AM

## 2015-11-07 NOTE — Progress Notes (Signed)
Discharge instructions and prescriptions reviewed with caregiver. Discharge medications reviewed.  Medications retrieved from [place] [facility] and returned to family.  Follow up appointments reviewed.  Caregiver voices understanding to teaching. To door via wheelchair. Home via [place] with his father driving.

## 2015-11-07 NOTE — Telephone Encounter (Signed)
Thank you, I agree with this plan.

## 2015-11-07 NOTE — Telephone Encounter (Signed)
Patient's mother called and left a voicemail for [name] stating that she needed a sooner appt than she was scheduled for. She was scheduled to see Dr. Gaynell Face on 3/3 but wanted to know if she could be seen this week or next for the hospital f/u.   I arranged for [name] to come in at 2:00pm tomorrow with arrival time of 1:45PM and cancelled previously scheduled appt.

## 2015-11-07 NOTE — Care Management Note (Signed)
Case Management Note  Patient Details  Name: [name] MRN: [id number] Date of Birth: [birth year]  Subjective/Objective:                    Action/Plan: Plan is for patient to discharge home today with his parents. CM met with [name]' parents and they are active with [facility]/ [employer] for supplies and oxygen. They state they do not have any further needs at this time.   Expected Discharge Date:  11/06/15               Expected Discharge Plan:  Home/Self Care  In-House Referral:     Discharge planning Services  CM Consult  Post Acute Care Choice:    Choice offered to:     DME Arranged:    DME Agency:     HH Arranged:    HH Agency:     Status of Service:  In process, will continue to follow  Medicare Important Message Given:    Date Medicare IM Given:    Medicare IM give by:    Date Additional Medicare IM Given:    Additional Medicare Important Message give by:     If discussed at [place] of Stay Meetings, dates discussed:    Additional Comments:  Pollie Friar, RN 11/07/2015, 10:59 AM

## 2015-11-07 NOTE — Discharge Summary (Signed)
Physician Discharge Summary  [name] [id number] DOB: 28-Nov-1985 DOA: 11/03/2015  PCP: Chesley Noon, MD  Admit date: 11/03/2015 Discharge date: 11/07/2015  Time spent: 20 minutes  Recommendations for Outpatient Follow-up:  1. Follow up with PCP in 1-2 weeks 2. 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 In there ER he was awake and interactive and in no distress. Note despite vagus nerve stimulator/anti szs medications he has szs activity approprioximately 2x a week.  Hospital Course:  Szs Disorder (complex and refractory) Followed by Dr. Gaynell Face - med dosing was adjusted by Neuro - he is currently on 5 different antiseizure medications. Patient appears to be close to baseline. Per family he has 1-2 breakthrough seizures every week. Discussed with Dr. Gaynell Face to confirm med dosing. For now, will continue on vimpat 100mg  TID as currently ordered (Neurology had recommended 200mg  in AM and PM and 100mg  at lunch, however per Dr. Gaynell Face, dose likely too high for patient). Also topiramate dose increased from 250mg  to 300mg  daily this admission, will continue. Patient to follow up closely with Dr. Gaynell Face in one  month.  Post arrest - likely resp distress post sz No evidence of an actual cardiac arrest   Hypokalemia  Due to poor intake - replaced continue to monitor with magnesium levels.  OSA on CPAP at night.  Chest contusion from CPR with mild non-ACS pattern troponin rise post CPR Denies chest pain, EKG was nonacute.  Presumed aspiration Has been afebrile th/o hospital stay - WBC normal - was on Zosyn which was stopped on 11/05/2015, monitor clinically  Mental handicap  Parents are his caregivers and appear very attentive to his needs/care  Consultations:  Neurology  Discharge Exam: Filed Vitals:   11/06/15 1839 11/07/15 0324 11/07/15 0624 11/07/15 0905  BP: 105/70 107/72 100/68 100/61  Pulse: 85 78 82 85  Temp: 98.3 F (36.8 C) 98 F (36.7 C) 98.4 F (36.9 C) 97.9 F (36.6 C)  TempSrc: Oral Oral Oral Oral  Resp: 17 18 20 20   Height:      Weight:      SpO2: 97% 100% 100% 96%    General: Awake, in nad Cardiovascular: regular, s1, s2 Respiratory: normal resp effort, no wheezing  Discharge Instructions     Medication List    TAKE these medications        acetaminophen 500 MG tablet  Commonly known as:  TYLENOL  Take 1,000 mg by mouth every 6 (six) hours as needed for headache.     carbamazepine 100 MG chewable tablet  Commonly known as:  TEGRETOL  Chew 2.5 tablets (250 mg total) by mouth 3 (three) times daily.     cetirizine 10 MG tablet  Commonly known as:  ZYRTEC  Take 10 mg by mouth daily.     CRESTOR 10 MG tablet  Generic drug:  rosuvastatin  Take 10 mg by mouth at bedtime.     diazepam 1 MG/ML solution  Commonly known as:  VALIUM  Inject 2 mg into the vein every 8 (eight) hours as needed for anxiety (Through port cath).     guaifenesin 400 MG Tabs tablet  Commonly known as:  HUMIBID E  Take 400 mg by mouth 2 (two) times daily.     KEPPRA 500 MG tablet  Generic drug:  levETIRAcetam  TAKE THREE TABLETS BY MOUTH TWICE DAILY     KLOR-CON M20  20 MEQ tablet  Generic drug:  potassium chloride SA  TAKE 1 TABLET BY MOUTH 4 TIMES A DAY     Lacosamide 100 MG Tabs  Take 1 tablet (100 mg total) by mouth 2 (two) times daily.     Lacosamide 100 MG Tabs  Take 1 tablet (100 mg total) by mouth daily with lunch.     levothyroxine 25 MCG tablet  Commonly known as:  SYNTHROID, LEVOTHROID  Take 25 mcg by mouth daily before breakfast.     LORazepam 2 MG/ML concentrated solution  Commonly known as:  ATIVAN  Withdraw 35ml (2mg ) and 20ml  Normal saline into 3 ml syringe. Administer IV push over 3-5 minutes as needed for recurrent seizures     multivitamin with minerals Tabs tablet  Take 1 tablet by mouth daily.     ondansetron 4 MG disintegrating tablet  Commonly known as:  ZOFRAN-ODT  Take 1 tablet as needed for nausea and vomiting     [name] 10 MG tablet  Generic drug:  cloBAZam  TAKE 1/2 TABLET BY MOUTH 3 TIMES A DAY     cloBAZam 5 MG tablet  Commonly known as:  [name]  Take 1 tablet (5 mg total) by mouth 3 (three) times daily.     PHENObarbital 65 MG/ML injection  Commonly known as:  LUMINAL  Withdraw 36ml (65mg ) Phenobarbital and 67ml Normal Saline into 38ml syringe. [place] IV push for 3-5 minutes as needed for recurrent seizures     potassium phosphate (monobasic) 500 MG tablet  Commonly known as:  K-PHOS ORIGINAL  Take 1 tablet (500 mg total) by mouth 4 (four) times daily.     SINGULAIR 10 MG tablet  Generic drug:  montelukast  Take 10 mg by mouth at bedtime.     Topiramate ER 200 MG Cp24  Commonly known as:  TROKENDI XR  Take 200 mg by mouth at bedtime.     Topiramate ER 50 MG Cp24  Commonly known as:  TROKENDI XR  Take 100 mg by mouth at bedtime.       Allergies  Allergen Reactions  . Antihistamines, Chlorpheniramine-Type Other (See Comments)    Other Reaction: Dimetapp causes seizures  . Divalproex Sodium Other (See Comments)    Nausea, vomiting, liver and kidney dysfunction  . Phenytoin Rash and Other (See  Comments)    Dilantin causes more seizures.   . Valproic Acid And Related Other (See Comments)    Shuts down systems  . Vancomycin Rash and Other (See Comments)    Other Reaction: Redman's Syndrome   Follow-up Information    Follow up with BADGER,MICHAEL C, MD. Schedule an appointment as soon as possible for a visit in 1 week.   Specialty:  Family Medicine   Why:  Hospital follow up   Contact  information:   [place] [address] [phone]       Follow up with Jodi Geralds, MD. Schedule an appointment as soon as possible for a visit in 1 month.   Specialties:  Pediatrics, Radiology   Why:  Hospital follow up   Contact information:   [address] [place] [address] [phone]        The results of significant diagnostics from this hospitalization (including imaging, microbiology, ancillary and laboratory) are listed below for reference.    Significant Diagnostic Studies: Dg Chest Port 1 View  11/04/2015  CLINICAL DATA:  Acute onset of shortness of breath. Initial encounter. EXAM: PORTABLE CHEST 1 VIEW COMPARISON:  Chest radiograph performed 11/03/2015 FINDINGS: The patient's enteric tube is seen extending below the diaphragm. A right-sided chest port is noted ending about the distal SVC. Mild vascular congestion is noted. The lungs remain grossly clear. No pleural effusion or pneumothorax is seen. The cardiomediastinal silhouette is borderline normal in size. A metallic device is noted overlying the left side of the chest. No acute osseous abnormalities are seen. IMPRESSION: Mild vascular congestion noted.  Lungs remain grossly clear. Electronically Signed   By: Garald Balding M.D.   On: 11/04/2015 03:51   Dg Chest Portable 1 View  11/03/2015  CLINICAL DATA:  Cyanotic episode. History of seizures. CPR performed prior to arrival at hospital EXAM: PORTABLE CHEST 1 VIEW COMPARISON:  September 29, 2015 FINDINGS: [name] tip is in the  superior vena cava. No pneumothorax. There is a stimulator on the left extending to the left cervical -thoracic junction, stable. There is mild generalized interstitial edema. Lungs elsewhere clear. Heart size and pulmonary vascularity are normal. No adenopathy. No bone lesions. IMPRESSION: Mild generalized interstitial edema. Suspect noncardiogenic etiology given clinical history and the absence of cardiac enlargement. No airspace consolidation. No pneumothorax. Electronically Signed   By: Lowella Grip Perez M.D.   On: 11/03/2015 10:40   Dg Abd Portable 1v  11/04/2015  CLINICAL DATA:  Nasogastric tube placement.  Initial encounter. EXAM: PORTABLE ABDOMEN - 1 VIEW COMPARISON:  Abdominal radiograph performed 11/03/2015 FINDINGS: The patient's enteric tube is noted ending at the duodenum. The visualized bowel gas pattern is grossly unremarkable. No free intra-abdominal air is seen, though evaluation for free air is limited on a single supine view. No acute osseous abnormalities are identified. IMPRESSION: Enteric tube noted ending at the duodenum. Electronically Signed   By: Garald Balding M.D.   On: 11/04/2015 03:40   Dg Abd Portable 1v  11/03/2015  CLINICAL DATA:  Check orogastric tube placement.  Initial encounter. EXAM: PORTABLE ABDOMEN - 1 VIEW COMPARISON:  Chest and abdominal radiographs performed 11/30/2012 FINDINGS: The patient's enteric tube is seen ending overlying the body of the stomach. The visualized bowel gas pattern is unremarkable. Scattered air and stool filled loops of colon are seen; no abnormal dilatation of small bowel loops is seen to suggest small bowel obstruction. No free intra-abdominal air is identified, though evaluation for free air is limited on a single supine view. The visualized osseous structures are within normal limits; the sacroiliac joints are unremarkable in appearance. The visualized lung bases are essentially clear. IMPRESSION: 1. Enteric tube seen ending overlying  the body of the stomach. 2. Unremarkable bowel gas pattern; no free intra-abdominal air seen. Small to moderate amount of stool noted in the colon. Electronically Signed   By: Garald Balding M.D.   On: 11/03/2015 23:22    Microbiology:  Recent Results (from the past 240 hour(s))  Blood culture (routine x 2)     Status: None (Preliminary result)   Collection Time: 11/03/15 10:35 AM  Result Value Ref Range Status   Specimen Description BLOOD RIGHT ANTECUBITAL  Final   Special Requests   Final    BOTTLES DRAWN AEROBIC AND ANAEROBIC 10CC AER 5CC ANA   Culture NO GROWTH 3 DAYS  Final   Report Status PENDING  Incomplete  Blood culture (routine x 2)     Status: None (Preliminary result)   Collection Time: 11/03/15 10:40 AM  Result Value Ref Range Status   Specimen Description BLOOD RIGHT HAND  Final   Special Requests BOTTLES DRAWN AEROBIC ONLY 10CC  Final   Culture NO GROWTH 3 DAYS  Final   Report Status PENDING  Incomplete  MRSA PCR Screening     Status: None   Collection Time: 11/03/15  4:26 PM  Result Value Ref Range Status   MRSA by PCR NEGATIVE NEGATIVE Final    Comment:        The GeneXpert MRSA Assay (FDA approved for NASAL specimens only), is one component of a comprehensive MRSA colonization surveillance program. It is not intended to diagnose MRSA infection nor to guide or monitor treatment for MRSA infections.   Urine culture     Status: None   Collection Time: 11/04/15  2:18 AM  Result Value Ref Range Status   Specimen Description URINE, CLEAN CATCH  Final   Special Requests NONE  Final   Culture NO GROWTH 1 DAY  Final   Report Status 11/05/2015 FINAL  Final     Labs: Basic Metabolic Panel:  Recent Labs Lab 11/03/15 1032 11/03/15 1428 11/04/15 0907 11/05/15 0244 11/06/15 0644  NA 140  --  139 138 139  K 4.0  --  3.5 3.0* 3.5  CL 109  --  109 111 108  CO2 22  --  20* 20* 20*  GLUCOSE 151*  --  107* 108* 94  BUN 8  --  7 7 8   CREATININE 1.07  --  0.83  0.94 0.79  CALCIUM 8.4*  --  8.5* 8.6* 8.8*  MG  --  1.8  --   --  1.8  PHOS  --  3.4  --   --   --    Liver Function Tests:  Recent Labs Lab 11/03/15 1032  AST 63*  ALT 57  ALKPHOS 118  BILITOT 0.3  PROT 7.2  ALBUMIN 3.4*   No results for input(s): LIPASE, AMYLASE in the last 168 hours. No results for input(s): AMMONIA in the last 168 hours. CBC:  Recent Labs Lab 11/03/15 1032 11/04/15 0116 11/06/15 0644  WBC 9.9 10.1 4.4  NEUTROABS 7.2  --   --   HGB 13.4 12.4* 11.4*  HCT 41.2 37.8* 34.1*  MCV 86.9 85.9 86.3  PLT 230 208 211   Cardiac Enzymes:  Recent Labs Lab 11/03/15 1428 11/03/15 1834 11/04/15 0116  TROPONINI 0.56* 0.65* 0.26*   BNP: BNP (last 3 results) No results for input(s): BNP in the last 8760 hours.  ProBNP (last 3 results) No results for input(s): PROBNP in the last 8760 hours.  CBG:  Recent Labs Lab 11/05/15 0003 11/05/15 0406 11/05/15 0814 11/05/15 1210 11/05/15 1543  GLUCAP 99 102* 82 91 104*       Signed:  Jacquelinne Speak K  [facility] 11/07/2015, 10:24 AM

## 2015-11-08 ENCOUNTER — Ambulatory Visit (INDEPENDENT_AMBULATORY_CARE_PROVIDER_SITE_OTHER): Payer: Medicaid Other | Admitting: Pediatrics

## 2015-11-08 ENCOUNTER — Encounter: Payer: Self-pay | Admitting: Pediatrics

## 2015-11-08 VITALS — BP 98/70 | HR 82 | Ht 63.25 in | Wt 115.0 lb

## 2015-11-08 DIAGNOSIS — G40109 Localization-related (focal) (partial) symptomatic epilepsy and epileptic syndromes with simple partial seizures, not intractable, without status epilepticus: Secondary | ICD-10-CM | POA: Diagnosis not present

## 2015-11-08 DIAGNOSIS — F70 Mild intellectual disabilities: Secondary | ICD-10-CM | POA: Diagnosis not present

## 2015-11-08 DIAGNOSIS — R269 Unspecified abnormalities of gait and mobility: Secondary | ICD-10-CM | POA: Diagnosis not present

## 2015-11-08 DIAGNOSIS — G40119 Localization-related (focal) (partial) symptomatic epilepsy and epileptic syndromes with simple partial seizures, intractable, without status epilepticus: Secondary | ICD-10-CM

## 2015-11-08 DIAGNOSIS — G40319 Generalized idiopathic epilepsy and epileptic syndromes, intractable, without status epilepticus: Secondary | ICD-10-CM

## 2015-11-08 LAB — CULTURE, BLOOD (ROUTINE X 2)
Culture: NO GROWTH
Culture: NO GROWTH

## 2015-11-08 MED ORDER — LACOSAMIDE 100 MG PO TABS: ORAL_TABLET | ORAL | Status: DC

## 2015-11-08 MED ORDER — TOPIRAMATE ER 50 MG PO CAP24: ORAL_CAPSULE | ORAL | Status: DC

## 2015-11-08 NOTE — Progress Notes (Signed)
Patient: [name] MRN: [id number] Sex: male DOB: 05-12-1986  Provider: Jodi Geralds, MD Location of Care: [facility]  Note type: Routine return visit  History of Present Illness: Referral Source: Dr. Veronia Beets History from: patient, hospital chart, [facility] chart and parents, sister Chief Complaint: Hospital Follow-Up  [name] is a 30 y.o. male who was evaluated on November 08, 2015 for the first time since September 27, 2015.  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  On his last visit, he had been doing quite well with his seizures occurring only one to three every three weeks.    He had a serious event in early morning seizure that was not witnessed and fell out of bed he was cyanotic and apneic.  His parents started CPR and he responded within 10 compressions.  He was taken to the emergency department as carbamazepine level was 13.  He had another ED evaluation June 25, 2016 where he had headaches and intractable vomiting and responded to a migraine cocktail.  He was hospitalized November 03, 2015 through November 07, 2015 when he presented with apnea and possible cardiac arrest.  His parents resuscitated him for 8 to 10 minutes before EMS arrived.  Interestingly, he had a pulse of 120 that was detectable when EMS arrived.  He is being resuscitated by his brother-in-law who is a Statistician and has experience in CPR, his sister who is a CNA has helped to take care of Marcus Moores since she was a teenager, and both of his  parents.  He took quite some time to recover from his postictal state.  Of great concern to his parents is that after he arrived at the hospital they expected that he would receive an IV and be treated with Ativan and phenobarbital.  Due to the onset of his seizures at 5:30, he did not receive his evening doses of the medications.  He could not take many IV including Trokendi, carbamazepine, and [name].  In addition, though he took Lacosamide and Keppra they were not given intravenously either.  His parents warned the emergency doctor that he would have more seizures and when he did they became understandably upset.  He finally received one dose of Ativan and two doses of phenobarbital.  Fortunately, he did not have significant respiratory depression.  He had problems with 3 convulsive seizures and 10 complex partial seizures after arrival of the hospital.  Seizures began around 5:30 p.m., which caused his arrest and did not stop until around 3:30 a.m.  Fortunately, Marcus Moores recovered from this.  No changes were made in carbamazepine, levetiracetam, or [name].  Lacosamide was increased from 50 mg in the morning, 100 mg in midday, and 50 mg at nighttime to 200, 100, 200 which was a massive increase.  Fortunately, he did not receive all of that for long.  Trokendi was increased from 250 at nighttime to 300 at nighttime.  I interrogated, but did not reprogram his vagal nerve stimulator because it is near optimal levels.  This was implanted on August 20, 2014 and has over 90% of the battery-life left.  Review of Systems: 12 system review was remarkable  for [employer] seizure, recent hopital admission  Past Medical History Diagnosis Date  . ALLERGIC RHINITIS 12/26/2009  . MITRAL VALVE PROLAPSE 06/22/2007  . SEIZURE DISORDER 06/22/2007  . Unspecified hypothyroidism 06/22/2007  . Growth disorder   . Mental retardation, mild (I.Q. 50-70)   . Brown's syndrome   . Heart murmur     moderate MR  . Sleep apnea      cpap , last sleep study 10/01/11  . Hypokalemia   . Hyperlipidemia     No therapy  . Blood clot in vein    Hospitalizations: Yes.  , Head Injury: No., Nervous System Infections: No., Immunizations up to date: Yes.    Diagnosis of developmental delay as a toddler.   EEG 11/07/86 was normal for gestational age.   Genetics evaluation short stature, prenatal onset, chromosomal study: 22 XY, fragile X syndrome negative, evaluation for MELAS and MERRF were negative.  Initial seizure 08/28/94 left locomotor with secondary generalization.   MRI of the brain 09/13/93: Scattered subcortical white matter lesions at the parietotemporal parieto-occipital junction is ischemic versus hamartomas.   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   Diagnosis attention deficit disorder inattentive type treated without success with Cylert April 1995  MRI brain 04/04/94 stable differential diagnosis hamartoma, vasculitis, ischemic, or embolic phenomenon.   Status epilepticus 01/31/95, and 04/15/95 Neurontin added to Tegretol   MRI brain 06/14/96 unchanged   Admission to [facility]. Tegretol 30 mcg for militate. 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   Topiramate started in May 1999 unable to be tolerated with Tegretol and was tapered and discontinued.  January 2000 EEG showed right greater than left mid temporal sharp waves was otherwise well-organized EEG. He hospitalizations in March, July, October, in November for recurrent seizures.  Patient placed on Depakote in April 2000  and developed gagging abdominal pain headache and had significant change in transaminases and liver functions. Topamax was restarted and Keppra was added.  Vimpat was started July 16, 2012 and adjusted upwards.   [name] was added on November 18, 2012 and has been adjusted upwards. Topamax was changed to Trokendi XR in attempt to deal with sleepiness, unsteadiness caused by polypharmacy. We to these medications have been introduced, the patient experiences somewhat improved seizure control however Is quite likely that despite better seizure control, the patient is experiencing impairment in cognition and gait as a result of polypharmacy .  MRI of the brain on March 21, 2013. The patient has extensive calcification to the basal ganglia bilaterally, normal ventricle size, and no acute findings. EKG was unchanged  Birth History He was a breech presentation, cesarean section delivery. He had intrauterine growth retardation, and failure to thrive.   Behavior History none  Surgical History Procedure Laterality Date  . Implantation vagal nerve stimulator      Left  . Portacath placement      and removal 04/08/12  . Eye surgery      X2 for Brown Syndrome   Family History family history includes Asthma in his father; Breast cancer in his mother; Congestive Heart Failure in his maternal grandfather; Coronary  artery disease in his maternal grandfather and paternal grandfather; Diabetes in his father; Lung cancer in his paternal grandfather; Pneumonia in his maternal grandfather and paternal grandmother; Stroke in his paternal grandmother. Family history is negative for migraines, seizures, intellectual disabilities, blindness, deafness, birth defects, chromosomal disorder, or autism.  Social History . Marital Status: Single    Spouse Name: N/A  . Number of Children: 0  . Years of Education: N/A   Occupational History  . Disabled    Social History Main Topics  . Smoking status: Never Smoker    . Smokeless tobacco: Never Used  . Alcohol Use: No  . Drug Use: No  . 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 He lives with his parents. He enjoys helping around the house, Nascar, and football.   Allergies Allergen Reactions  . Antihistamines, Chlorpheniramine-Type Other (See Comments)    Other Reaction: Dimetapp causes seizures  . Divalproex Sodium Other (See Comments)    Nausea, vomiting, liver and kidney dysfunction  . Phenytoin Rash and Other (See Comments)    Dilantin causes more seizures.   . Valproic Acid And Related Other (See Comments)    Shuts down systems  . Vancomycin Rash and Other (See Comments)    Other Reaction: Redman's Syndrome   Physical Exam BP 98/70 mmHg  Pulse 82  Ht 5' 3.25" (1.607 m)  Wt 115 lb (52.164 kg)  BMI 20.20 kg/m2  General: thin young man, seated on the examination table, in no distress, right-handed, sandy hair, blue/brown eyes; Awake, alert, animated, talkative.  Head: head microcephalic and atraumatic, His head is slightly turned with the right ear toward his right shoulder and chin points the left. He does not have torticollis. Left face and neck are less swollen than last visit  Ears, Nose and Throat: beak-like nose. He has a low lying palate. Wax occluding external auditory canals  Neck: supple with no carotid or supraclavicular bruits.  Respiratory: Lungs are clear to auscultation  Cardiovascular: regular rate and rhythm, soft holosystolic murmur at left sternal border. His nailbeds are pink.  Musculoskeletal: short stature with head tilted to the right ear. He has tight heel cords.  Skin: no rashes or lesions.  Trunk: no scoliosis. His portacath in the right upper chest. He has a vagal nerve stimulator in the left upper chest.  Neurologic Exam  Mental Status: Awake, alert. oriented to place. Attention span,  concentration, and fund of knowledge are below average. He has evidence of mild intellectual disability. Mood and affect appropriate. 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 Pupils equal, briskly reactive to light. Extraocular movements full but dysconjugate. Visual fields full to confrontation. Hearing intact to air conduction greater than bone conduction. Facial sensation intact. Face, tongue, palate move normally and symmetrically. Neck flexion and extension normal. 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 He had a slight tic-like movement of his mouth.  Motor: Diminished bulk and normal tone. The patient has diffuse mild weakness in his proximal and distal muscles in the 4-4+ over 5 range.He had normal strength in his legs. He has clumsy fine motor movements. His hands are semiflexed at the distal joints at rest but he can extend them.  Sensory: Intact to light touch,cold, vibration, and proprioception in all extremities; good  stereognosis  Coordination: Rapid alternating movements are clumsy in all extremities. Finger-to-nose and heel-to-shin performed accurately bilaterally. Romberg negative.  Gait and Station: Right foot is in a somewhat valgus position, but the heel strike for both feet is good. His gait is slightly broad-based and was ataxic Reflexes: Absent and symmetric. Toes downgoing.  Assessment 1. 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 2. 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 3. 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 4. Abnormality of gait, R26.9. 5. Mild intellectual disability, F70.  Discussion I am very concerned about Marcus Moores.  I believe that someday his family will not be able to resuscitate him after he has one of these episodes.  I talked around that, but I think that  his family understands my concerns.  I do not think that we can significantly increase his medication.  I hope that the episode of respiratory arrest and possible cardiac arrest might slow down as they did previously that he has had two in the last four months.  I do not think there is anything else that I can suggest.    Plan His family has requested a second opinion at [facility] and I will try to arrange that.  I also recommended that they write a letter to [facility] describing the concerns that they have related to his treatment in the emergency department.  I spent 40 minutes of face-to-face time with Marcus Moores and his family, more than half consultation.  In addition I interrogated and programmed his vagal nerve stimulator.  I rewrote his prescriptions to reflect current doses that I want him to take.  He will return to see me in 2 months' time.   Medication List   This list is accurate as of: 11/08/15 11:59 PM.       acetaminophen 500 MG tablet  Commonly known as:  TYLENOL  Take 1,000 mg by mouth every 6 (six) hours as needed for headache.     carbamazepine 100 MG chewable tablet  Commonly known as:  TEGRETOL  Chew 2.5 tablets (250 mg total) by mouth 3 (three) times daily.     cetirizine 10 MG tablet  Commonly known as:  ZYRTEC  Take 10 mg by mouth daily.     CRESTOR 10 MG tablet  Generic drug:  rosuvastatin  Take 10 mg by mouth at bedtime.     diazepam 1 MG/ML solution  Commonly known as:  VALIUM  Inject 2 mg into the vein every 8 (eight) hours as needed for anxiety (Through port cath).     guaifenesin 400 MG Tabs tablet  Commonly known as:  HUMIBID E  Take 400 mg by mouth 2 (two) times daily.     KEPPRA 500 MG tablet  Generic drug:  levETIRAcetam  TAKE THREE TABLETS BY MOUTH TWICE DAILY     KLOR-CON M20 20 MEQ tablet  Generic drug:  potassium chloride SA  TAKE 1 TABLET BY MOUTH 4 TIMES A DAY     Lacosamide 100 MG Tabs  Take one tablet 3 times daily       levothyroxine 25 MCG tablet  Commonly known as:  SYNTHROID, LEVOTHROID  Take 25 mcg by mouth daily before breakfast.     LORazepam 2 MG/ML concentrated solution  Commonly known as:  ATIVAN  Withdraw 71ml (2mg ) and 61ml  Normal saline into 3 ml syringe. Administer IV push over 3-5 minutes as needed for recurrent seizures     multivitamin with minerals  Tabs tablet  Take 1 tablet by mouth daily.     ondansetron 4 MG disintegrating tablet  Commonly known as:  ZOFRAN-ODT  Take 1 tablet as needed for nausea and vomiting     [name] 10 MG tablet  Generic drug:  cloBAZam  TAKE 1/2 TABLET BY MOUTH 3 TIMES A DAY     PHENObarbital 65 MG/ML injection  Commonly known as:  LUMINAL  Withdraw 47ml (65mg ) Phenobarbital and 75ml Normal Saline into 28ml syringe. Adminster IV push for 3-5 minutes as needed for recurrent seizures     potassium phosphate (monobasic) 500 MG tablet  Commonly known as:  K-PHOS ORIGINAL  Take 1 tablet (500 mg total) by mouth 4 (four) times daily.     SINGULAIR 10 MG tablet  Generic drug:  montelukast  Take 10 mg by mouth at bedtime.     Topiramate ER 200 MG Cp24  Commonly known as:  TROKENDI XR  Take 200 mg by mouth at bedtime.     Topiramate ER 50 MG Cp24  Commonly known as:  TROKENDI XR  Take 1 tablet at bedtime      The medication list was reviewed and reconciled. All changes or newly prescribed medications were explained.  A complete medication list was provided to the patient/caregiver.  Jodi Geralds MD

## 2015-11-22 ENCOUNTER — Emergency Department (HOSPITAL_COMMUNITY)
Admission: EM | Admit: 2015-11-22 | Discharge: 2015-11-23 | Disposition: A | Discharge status: Home / Self Care | Payer: Medicaid Other | Attending: Emergency Medicine | Admitting: Emergency Medicine

## 2015-11-22 ENCOUNTER — Encounter (HOSPITAL_COMMUNITY): Payer: Self-pay

## 2015-11-22 ENCOUNTER — Telehealth: Payer: Self-pay

## 2015-11-22 DIAGNOSIS — R1110 Vomiting, unspecified: Secondary | ICD-10-CM | POA: Diagnosis present

## 2015-11-22 DIAGNOSIS — Z8659 Personal history of other mental and behavioral disorders: Secondary | ICD-10-CM | POA: Diagnosis not present

## 2015-11-22 DIAGNOSIS — R05 Cough: Secondary | ICD-10-CM | POA: Diagnosis not present

## 2015-11-22 DIAGNOSIS — E039 Hypothyroidism, unspecified: Secondary | ICD-10-CM | POA: Insufficient documentation

## 2015-11-22 DIAGNOSIS — R109 Unspecified abdominal pain: Secondary | ICD-10-CM | POA: Diagnosis not present

## 2015-11-22 DIAGNOSIS — R51 Headache: Secondary | ICD-10-CM | POA: Insufficient documentation

## 2015-11-22 DIAGNOSIS — E785 Hyperlipidemia, unspecified: Secondary | ICD-10-CM | POA: Diagnosis not present

## 2015-11-22 DIAGNOSIS — Z8709 Personal history of other diseases of the respiratory system: Secondary | ICD-10-CM | POA: Diagnosis not present

## 2015-11-22 DIAGNOSIS — R011 Cardiac murmur, unspecified: Secondary | ICD-10-CM | POA: Insufficient documentation

## 2015-11-22 DIAGNOSIS — Z8679 Personal history of other diseases of the circulatory system: Secondary | ICD-10-CM | POA: Diagnosis not present

## 2015-11-22 DIAGNOSIS — G40909 Epilepsy, unspecified, not intractable, without status epilepticus: Secondary | ICD-10-CM | POA: Insufficient documentation

## 2015-11-22 DIAGNOSIS — Z79899 Other long term (current) drug therapy: Secondary | ICD-10-CM | POA: Diagnosis not present

## 2015-11-22 DIAGNOSIS — G4730 Sleep apnea, unspecified: Secondary | ICD-10-CM | POA: Diagnosis not present

## 2015-11-22 DIAGNOSIS — R059 Cough, unspecified: Secondary | ICD-10-CM

## 2015-11-22 DIAGNOSIS — E860 Dehydration: Secondary | ICD-10-CM

## 2015-11-22 DIAGNOSIS — Z9981 Dependence on supplemental oxygen: Secondary | ICD-10-CM | POA: Diagnosis not present

## 2015-11-22 LAB — CBC
HEMATOCRIT: 39.6 % (ref 39.0–52.0)
Hemoglobin: 13.1 g/dL (ref 13.0–17.0)
MCH: 28.5 pg (ref 26.0–34.0)
MCHC: 33.1 g/dL (ref 30.0–36.0)
MCV: 86.3 fL (ref 78.0–100.0)
PLATELETS: 294 10*3/uL (ref 150–400)
RBC: 4.59 MIL/uL (ref 4.22–5.81)
RDW: 13.8 % (ref 11.5–15.5)
WBC: 4.3 10*3/uL (ref 4.0–10.5)

## 2015-11-22 LAB — COMPREHENSIVE METABOLIC PANEL
ALT: 25 U/L (ref 17–63)
ANION GAP: 10 (ref 5–15)
AST: 23 U/L (ref 15–41)
Albumin: 3.7 g/dL (ref 3.5–5.0)
Alkaline Phosphatase: 158 U/L — ABNORMAL HIGH (ref 38–126)
BILIRUBIN TOTAL: 0.5 mg/dL (ref 0.3–1.2)
BUN: 11 mg/dL (ref 6–20)
CO2: 21 mmol/L — ABNORMAL LOW (ref 22–32)
Calcium: 9.4 mg/dL (ref 8.9–10.3)
Chloride: 111 mmol/L (ref 101–111)
Creatinine, Ser: 0.85 mg/dL (ref 0.61–1.24)
Glucose, Bld: 102 mg/dL — ABNORMAL HIGH (ref 65–99)
POTASSIUM: 3.9 mmol/L (ref 3.5–5.1)
Sodium: 142 mmol/L (ref 135–145)
TOTAL PROTEIN: 7.2 g/dL (ref 6.5–8.1)

## 2015-11-22 LAB — URINALYSIS, ROUTINE W REFLEX MICROSCOPIC
Bilirubin Urine: NEGATIVE
Glucose, UA: NEGATIVE mg/dL
Hgb urine dipstick: NEGATIVE
Ketones, ur: 15 mg/dL — AB
LEUKOCYTES UA: NEGATIVE
NITRITE: NEGATIVE
PH: 7.5 (ref 5.0–8.0)
PROTEIN: NEGATIVE mg/dL
Specific Gravity, Urine: 1.023 (ref 1.005–1.030)

## 2015-11-22 LAB — CARBAMAZEPINE LEVEL, TOTAL: Carbamazepine Lvl: 11.3 ug/mL (ref 4.0–12.0)

## 2015-11-22 MED ORDER — SODIUM CHLORIDE 0.9 % IV SOLN: INTRAVENOUS | Status: DC

## 2015-11-22 MED ORDER — ONDANSETRON HCL 4 MG/2ML IJ SOLN
4.0000 mg | Freq: Once | INTRAMUSCULAR | Status: AC
  Administered 2015-11-22: 4 mg via INTRAVENOUS
  Filled 2015-11-22: qty 2

## 2015-11-22 MED ORDER — SODIUM CHLORIDE 0.9 % IV BOLUS (SEPSIS)
2000.0000 mL | Freq: Once | INTRAVENOUS | Status: AC
Start: 2015-11-22 — End: 2015-11-23
  Administered 2015-11-22: 2000 mL via INTRAVENOUS

## 2015-11-22 MED ORDER — ONDANSETRON 4 MG PO TBDP
ORAL_TABLET | ORAL | Status: AC
  Administered 2015-11-22: 4 mg via ORAL
  Filled 2015-11-22: qty 1

## 2015-11-22 MED ORDER — ONDANSETRON 4 MG PO TBDP
4.0000 mg | ORAL_TABLET | Freq: Once | ORAL | Status: AC | PRN
  Administered 2015-11-22: 4 mg via ORAL

## 2015-11-22 NOTE — ED Notes (Signed)
Pt c/o N&V that started this AM. Pt's caregiver states the he had a recent change to his sz rx. Pt was started on Vempat on 2/15 with an increasing dose. Pt's caregiver also states he has been unsteady on his feet.

## 2015-11-22 NOTE — ED Notes (Signed)
Pt sent from Dr Gaynell Face office for further evaluation of vomiting, pt was unable to tolerate his oral medications today and had a seizure. Dr Gaynell Face concerned for gastroenteritis, dehydration. Can reach dr Gaynell Face at [phone]

## 2015-11-22 NOTE — Telephone Encounter (Signed)
Patient's mother called early this morning stating that she believes the medications the patient is taking, is making him sick since we went up on the Vimpat. She wants to know what adjustments can be made before she gives him his medication today. She says if there has to be blood work done, she can do that but she needs to know what the problem is and what can be done. She is requesting a call back ASAP.  CB:[phone]

## 2015-11-22 NOTE — ED Provider Notes (Signed)
CSN: [id number]     Arrival date & time 11/22/15  1321 History   First MD Initiated Contact with Patient 11/22/15 1641     Chief Complaint  Patient presents with  . Emesis     (Consider location/radiation/quality/duration/timing/severity/associated sxs/prior Treatment) HPI Comments: Pt here w/ nausea and non-bilious emesis x 1 week and worse today--no fever or dirrahea\ Mild abd pain w/o urinary sx, mild headache noted Increased weakness and emesis treated w/ zofran Pt had recent change in seizure meds No cough or congestion Called his neurologist today and told to come here for possible dehydration  Patient is a 30 y.o. male presenting with vomiting. The history is provided by a parent and the patient.  Emesis   Past Medical History  Diagnosis Date  . ALLERGIC RHINITIS 12/26/2009  . MITRAL VALVE PROLAPSE 06/22/2007  . SEIZURE DISORDER 06/22/2007  . Unspecified hypothyroidism 06/22/2007  . Growth disorder   . Mental retardation, mild (I.Q. 50-70)   . Brown's syndrome   . Heart murmur     moderate MR  . Sleep apnea     cpap , last sleep study 10/01/11  . Hypokalemia   . Hyperlipidemia     No therapy  . Blood clot in vein    Past Surgical History  Procedure Laterality Date  . Implantation vagal nerve stimulator      Left  . Portacath placement      and removal 04/08/12  . Eye surgery      X2 for Brown Syndrome   Family History  Problem Relation Age of Onset  . Breast cancer Mother   . Asthma Father   . Diabetes Father   . Coronary artery disease Maternal Grandfather   . Congestive Heart Failure Maternal Grandfather     Died at 48  . Pneumonia Maternal Grandfather   . Coronary artery disease Paternal Grandfather   . Lung cancer Paternal Grandfather     Died at 14  . Pneumonia Paternal Grandmother     Died at 17  . Stroke Paternal Grandmother    Social History  Substance Use Topics  . Smoking status: Never Smoker   . Smokeless tobacco: Never Used  . Alcohol Use:  No    Review of Systems  Gastrointestinal: Positive for vomiting.  All other systems reviewed and are negative.     Allergies  Antihistamines, chlorpheniramine-type; Divalproex sodium; Phenytoin; Valproic acid and related; and Vancomycin  Home Medications   Prior to Admission medications   Medication Sig Start Date End Date Taking? Authorizing Provider  acetaminophen (TYLENOL) 500 MG tablet Take 1,000 mg by mouth every 6 (six) hours as needed for headache.    Historical Provider, MD  carbamazepine (TEGRETOL) 100 MG chewable tablet Chew 2.5 tablets (250 mg total) by mouth 3 (three) times daily. 11/07/15   Donne Hazel, MD  cetirizine (ZYRTEC) 10 MG tablet Take 10 mg by mouth daily. 02/12/15   Historical Provider, MD  diazepam (VALIUM) 1 MG/ML solution Inject 2 mg into the vein every 8 (eight) hours as needed for anxiety (Through port cath).    Historical Provider, MD  guaifenesin (HUMIBID E) 400 MG TABS tablet Take 400 mg by mouth 2 (two) times daily.    Historical Provider, MD  KEPPRA 500 MG tablet TAKE THREE TABLETS BY MOUTH TWICE DAILY Patient taking differently: TAKE 1500MG  (THREE TABLETS) BY MOUTH TWICE DAILY 05/25/15   Rockwell Germany, NP  KLOR-CON M20 20 MEQ tablet TAKE 1 TABLET BY MOUTH 4 TIMES  A DAY 07/23/15   Rockwell Germany, NP  Lacosamide 100 MG TABS Take one tablet 3 times daily 11/08/15   Jodi Geralds, MD  levothyroxine (SYNTHROID, LEVOTHROID) 25 MCG tablet Take 25 mcg by mouth daily before breakfast.    Historical Provider, MD  LORazepam (ATIVAN) 2 MG/ML concentrated solution Withdraw 40ml (2mg ) and 25ml  Normal saline into 3 ml syringe. Administer IV push over 3-5 minutes as needed for recurrent seizures 06/26/15   Rockwell Germany, NP  montelukast (SINGULAIR) 10 MG tablet Take 10 mg by mouth at bedtime. 12/28/14 12/28/15  Historical Provider, MD  Multiple Vitamin (MULTIVITAMIN WITH MINERALS) TABS Take 1 tablet by mouth daily.    Historical Provider, MD  ondansetron  (ZOFRAN-ODT) 4 MG disintegrating tablet Take 1 tablet as needed for nausea and vomiting 09/27/15   Jodi Geralds, MD  [name] 10 MG tablet TAKE 1/2 TABLET BY MOUTH 3 TIMES A DAY 07/23/15   Rockwell Germany, NP  PHENObarbital (LUMINAL) 65 MG/ML injection Withdraw 11ml (65mg ) Phenobarbital and 25ml Normal Saline into 43ml syringe. Adminster IV push for 3-5 minutes as needed for recurrent seizures 06/26/15   Rockwell Germany, NP  potassium phosphate, monobasic, (K-PHOS ORIGINAL) 500 MG tablet Take 1 tablet (500 mg total) by mouth 4 (four) times daily. 07/13/15   Jodi Geralds, MD  rosuvastatin (CRESTOR) 10 MG tablet Take 10 mg by mouth at bedtime. 03/13/15 03/12/16  Historical Provider, MD  Topiramate ER (TROKENDI XR) 200 MG CP24 Take 200 mg by mouth at bedtime. 11/07/15   Donne Hazel, MD  Topiramate ER (TROKENDI XR) 50 MG CP24 Take 1 tablet at bedtime 11/08/15   Jodi Geralds, MD   BP 118/84 mmHg  Pulse 80  Temp(Src) 98.3 F (36.8 C) (Oral)  Resp 16  Ht 5\' 3"  (1.6 m)  Wt 50.803 kg  BMI 19.84 kg/m2  SpO2 100% Physical Exam  Constitutional: He is oriented to person, place, and time. He appears well-developed and well-nourished.  Non-toxic appearance. No distress.  HENT:  Head: Normocephalic and atraumatic.  Eyes: Conjunctivae, EOM and lids are normal. Pupils are equal, round, and reactive to light.  Neck: Normal range of motion. Neck supple. No tracheal deviation present. No thyroid mass present.  Cardiovascular: Normal rate, regular rhythm and normal heart sounds.  Exam reveals no gallop.   No murmur heard. Pulmonary/Chest: Effort normal and breath sounds normal. No stridor. No respiratory distress. He has no decreased breath sounds. He has no wheezes. He has no rhonchi. He has no rales.  Abdominal: Soft. Normal appearance and bowel sounds are normal. He exhibits no distension. There is no tenderness. There is no rebound and no CVA tenderness.  Musculoskeletal: Normal range of motion. He  exhibits no edema or tenderness.  Neurological: He is alert and oriented to person, place, and time. He has normal strength. No cranial nerve deficit or sensory deficit. GCS eye subscore is 4. GCS verbal subscore is 5. GCS motor subscore is 6.  Skin: Skin is warm and dry. No abrasion and no rash noted.  Psychiatric: His affect is blunt. His speech is delayed. He is slowed.  Nursing note and vitals reviewed.   ED Course  Procedures (including critical care time) Labs Review Labs Reviewed  COMPREHENSIVE METABOLIC PANEL - Abnormal; Notable for the following:    CO2 21 (*)    Glucose, Bld 102 (*)    Alkaline Phosphatase 158 (*)    All other components within normal limits  URINALYSIS, ROUTINE W  REFLEX MICROSCOPIC (NOT AT [facility]) - Abnormal; Notable for the following:    Ketones, ur 15 (*)    All other components within normal limits  CBC  CARBAMAZEPINE LEVEL, TOTAL    Imaging Review No results found. I have personally reviewed and evaluated these images and lab results as part of my medical decision-making.   EKG Interpretation None      MDM   Final diagnoses:  None    Patient given IV fluids here and no seizure activity noted. He did have a mild cough and chest x-ray per radiology without acute findings. Patient had a Tegretol level checked and it was therapeutic range. Patient stable for discharge to home    Lacretia Leigh, MD 11/23/15 [phone]

## 2015-11-22 NOTE — Telephone Encounter (Signed)
[name] is having migrainous issues, but he also is having vomiting without headache.  We will drop his lacosamide to 100 mg in the morning, 50 mg at midday and 100 mg at nighttime and observe.

## 2015-11-23 ENCOUNTER — Emergency Department (HOSPITAL_COMMUNITY): Payer: Medicaid Other

## 2015-11-23 NOTE — Discharge Instructions (Signed)

## 2015-12-05 ENCOUNTER — Inpatient Hospital Stay: Payer: Medicaid Other | Admitting: Pediatrics

## 2015-12-06 ENCOUNTER — Telehealth: Payer: Self-pay | Admitting: Family

## 2015-12-06 NOTE — Telephone Encounter (Signed)
Mom DJ Wessell left message about [name]. Mom said that he has had a migraine every day this week. One day, he awakened during the night with it and was vomiting. With the migraines, he complains of headache, blinks his eyes frequently, has complained of blurry vision at times, and has nausea and vomiting. Mom gives him Tylenol and he has to sleep for a few hours to get relief. Mom said that [name] has been having migraines for the last year but that they are now more frequent, and she is concerned because he has had one every day this week. Mom said that Tylenol used to help him better than it does now. Mom wonders if he needs any work up for migraines, repeat labs for potassium or carbamazepine levels etc. She wonders if increase in Vimpat could be responsible.   Mom's other concern was that she gave him Tylenol today for migraine, and 45 minutes later, [name] gave another dose because [name] didn't remember Mom giving it to him and she neglected to ask her mother. Mom wonders if that much Tylenol is dangerous for [name]. I talked with Mom about the Tylenol and told her that the extra dose of Tylenol would not likely hurt him today but that he shouldn't have that much on a regular basis. She said that [name] seemed ok, and finally went to sleep after vomiting with the migraine today.  I talked with her about his migraines and she said that in general he sleeps well, and drinks sufficient fluids. His appetite is not good, but that is not different from the past. He had labs drawn on ER on March 2nd and at that time both potassium and Carbamazepine levels were ok.  I told Mom that I would relay her concerns to Dr Gaynell Face. She would like him to call her back at [phone]. TG

## 2015-12-06 NOTE — Telephone Encounter (Signed)
I spoke with mother.  I don't have anything additional to add.  I don't think that there is a preventative medication that we can give him.

## 2015-12-10 ENCOUNTER — Telehealth: Payer: Self-pay | Admitting: Family

## 2015-12-10 NOTE — Telephone Encounter (Signed)
Email from [name]: I have been doing some research on CBD Marijuana and was wondering what Dr. Melanee Left thoughts are on using it for seizures? A story was done on a young girl that was having hundreds of seizures a day called, "Charlotte's Web".  It wasn't until she began to stop breathing and then going into Cardiac Arrest that her parents decided to give it a try. It has really helped this little girl. From what I read, it is legal in 33 states so far including [place]. It requires a prescription to get it. I also learned that it is being used for cataracts which [name] also has in both eyes.  I just wanted to see what Dr. Gaynell Face thinks about it and if there are any hospitals,( [place], [place], [place] ) or any others he knows of doing a study about the use of CBD Marijuana for seizures? Dr. Gaynell Face can call me about it at [phone].   Also, when we were last there, we talked with Dr. Lemmie Evens. about seeing a doctor at [facility]. I gave him some names that were referred to [place] when [name] was last in the hospital in Feb. Somehow they got erased from my phone. I wanted to see if he said anything about getting in touch with one of them for [place] to get an appointment?  I replied to Roosevelt email that I would relay her message to Dr Gaynell Face. TG

## 2015-12-10 NOTE — Telephone Encounter (Signed)
15 minute phone call with mother.  I do not think that [name] will fit the criteria for CBD use. I will speak with [name] about whether or not we have made a second opinion at [facility].  I don't remember doing so.  No [facility] in [place] is currently doing research into CBD.

## 2015-12-19 ENCOUNTER — Other Ambulatory Visit: Payer: Self-pay | Admitting: Family

## 2016-01-02 ENCOUNTER — Other Ambulatory Visit: Payer: Self-pay | Admitting: Pediatrics

## 2016-01-07 ENCOUNTER — Encounter: Payer: Self-pay | Admitting: Pediatrics

## 2016-01-07 ENCOUNTER — Ambulatory Visit (INDEPENDENT_AMBULATORY_CARE_PROVIDER_SITE_OTHER): Payer: Medicaid Other | Admitting: Pediatrics

## 2016-01-07 VITALS — BP 100/78 | HR 80 | Ht 63.25 in | Wt 115.2 lb

## 2016-01-07 DIAGNOSIS — G40119 Localization-related (focal) (partial) symptomatic epilepsy and epileptic syndromes with simple partial seizures, intractable, without status epilepticus: Secondary | ICD-10-CM

## 2016-01-07 DIAGNOSIS — G40319 Generalized idiopathic epilepsy and epileptic syndromes, intractable, without status epilepticus: Secondary | ICD-10-CM

## 2016-01-07 MED ORDER — TROKENDI XR 50 MG PO CP24: ORAL_CAPSULE | ORAL | Status: DC

## 2016-01-07 MED ORDER — TOPIRAMATE ER 200 MG PO CAP24: 1.0000 | ORAL_CAPSULE | Freq: Every day | ORAL | Status: DC

## 2016-01-07 MED ORDER — LACOSAMIDE 50 MG PO TABS: ORAL_TABLET | ORAL | Status: DC

## 2016-01-07 MED ORDER — CARBAMAZEPINE 100 MG PO CHEW: CHEWABLE_TABLET | ORAL | Status: DC

## 2016-01-07 NOTE — Progress Notes (Signed)
Patient: [name] MRN: [id number] Sex: male DOB: May 25, 1986  Provider: Jodi Geralds, MD Location of Care: [facility] Child Neurology  Note type: Routine return visit  History of Present Illness: Referral Source: Dr. Veronia Beets History from: both parents, patient and [facility] chart Chief Complaint: VNS/Seizure  [name] is a 30 y.o. male who returns on January 07, 2016, for the first time since November 08, 2015.  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  He has postictal with nonictal migraines, early onset cataracts, intellectual disability, mitral regurgitation, calcifications in his basal ganglia on MRI scan.  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  Over the past couple of months, he has had a serious problem with seizures.  He has had two hospitalizations for seizures associated with respiratory compromise in January and February 2017.  He has recovered from them, and is having somewhat less seizures and no seizures that required hospitalization.  He had a problem with Vimpat that caused nausea and vomiting and has responded to cropping his dose without marked increase in seizures.  He remains on five antiepileptic drugs, although one is given to him not only for seizure control, but migraines.  He also has a vagal nerve stimulator, which was reevaluated today.  Mother called in mid-March and noted that [name] had a migraine everyday during the week.  Fortunately, this has subsided for reasons that are unclear.  The family has requested a second opinion at [facility], which we have tried to arrange.  It was explained to the parents that it is not available either commercially or on an experimental basis.  When it is available, I do not think that [name] will meet the criteria for its use.  I interrogated and reprogrammed [name]' vagal nerve stimulator.  It is  working well.  Results were noted below.  Procedure: Interrogation and reprogramming of Vagal Nerve Stimulator  Implantation of the current stimulator 104 serial # [id number] implanted 04/19/14  Interrogation of the vagal nerve stimulator: Voltage 2.25 mA. Frequency 20 Hz, pulse width 250 microseconds, signal time on 7 seconds, signal time off 1.1 minutes, magnet current 2.50 mA, magnet signal time on 60 seconds, magnet pulse width 250 microseconds. I reprogrammed the computer for diagnostics.  The normal mode diagnostics using his current settings revealed the battery is nearly full, and communication, output, and impedance are good; 1774 ohms. 637 stimuli since implantation, 518 since September 26, 2014. There is no indication change the battery.  He tolerated the procedure well.  Review of Systems: 12 system review was assessed and except as noted above was negative  Past Medical History Diagnosis Date  . ALLERGIC RHINITIS 12/26/2009  . MITRAL VALVE PROLAPSE 06/22/2007  . SEIZURE DISORDER 06/22/2007  . Unspecified hypothyroidism 06/22/2007  . Growth disorder   . Mental retardation, mild (I.Q. 50-70)   . Brown's syndrome   . Heart murmur     moderate MR  . Sleep apnea     cpap , last sleep study 10/01/11  . Hypokalemia   . Hyperlipidemia     No therapy  . Blood clot in vein    Hospitalizations: No., Head Injury: No., Nervous System Infections: No., Immunizations up to date: Yes.    Diagnosis of developmental delay as a toddler.   EEG 11/07/86 was normal for gestational age.   Genetics evaluation short stature, prenatal onset, chromosomal study: 31 XY, fragile X syndrome negative, evaluation  for MELAS and MERRF were negative.  Initial seizure 08/28/94 left locomotor with secondary generalization.   MRI of the brain 09/13/93: Scattered subcortical white matter lesions at the parietotemporal parieto-occipital junction is ischemic versus hamartomas.   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   Diagnosis attention deficit disorder inattentive type treated without success with Cylert April 1995  MRI brain 04/04/94 stable differential diagnosis hamartoma, vasculitis, ischemic, or embolic phenomenon.   Status epilepticus 01/31/95, and 04/15/95 Neurontin added to Tegretol   MRI brain 06/14/96 unchanged   Admission to [facility]. Tegretol 30 mcg for militate. 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   Topiramate started in May 1999 unable to be tolerated with Tegretol and was tapered and discontinued.  January 2000 EEG showed right greater than left mid temporal sharp waves was otherwise well-organized EEG. He hospitalizations in March, July, October, in November for recurrent seizures.  Patient placed on Depakote in April 2000 and developed gagging abdominal pain headache and had significant change in transaminases and liver functions. Topamax was restarted and Keppra was added.  Vimpat was started July 16, 2012 and adjusted upwards.   [name] was added on November 18, 2012 and has been adjusted upwards. Topamax was changed to Trokendi XR in attempt to deal with sleepiness, unsteadiness caused by polypharmacy. We to these medications have been introduced, the patient experiences somewhat improved seizure control however Is quite likely that despite better seizure control, the patient is experiencing impairment in cognition and gait as a result of polypharmacy .  MRI of the brain on March 21, 2013. The patient has extensive calcification to the  basal ganglia bilaterally, normal ventricle size, and no acute findings. EKG was unchanged  Birth History He was a breech presentation, cesarean section delivery. He had intrauterine growth retardation, and failure to thrive.   Behavior History none  Surgical History Procedure Laterality Date  . Implantation vagal nerve stimulator      Left  . Portacath placement      and removal 04/08/12  . Eye surgery      X2 for Brown Syndrome   Family History family history includes Asthma in his father; Breast cancer in his mother; Congestive Heart Failure in his maternal grandfather; Coronary artery disease in his maternal grandfather and paternal grandfather; Diabetes in his father; Lung cancer in his paternal grandfather; Pneumonia in his maternal grandfather and paternal grandmother; Stroke in his paternal grandmother. Family history is negative for migraines, seizures, intellectual disabilities, blindness, deafness, birth defects, chromosomal disorder, or autism.  Social History . Marital Status: Single    Spouse Name: N/A  . Number of Children: 0  . Years of Education: N/A   Occupational History  . Disabled    Social History Main Topics  . Smoking status: Never Smoker   . Smokeless tobacco: Never Used  . Alcohol Use: No  . Drug Use: No  . 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 He lives with his parents. He enjoys helping  around the house, Nascar, and football.   Allergies Allergen Reactions  . Antihistamines, Chlorpheniramine-Type Other (See Comments)    Other Reaction: Dimetapp causes seizures  . Divalproex Sodium Other (See Comments)    Nausea, vomiting, liver and kidney dysfunction  . Phenytoin Rash and Other (See Comments)    Dilantin causes more seizures.   . Valproic Acid And Related Other (See Comments)    Shuts down systems  . Vancomycin Rash and Other  (See Comments)    Other Reaction: Redman's Syndrome   Physical Exam BP 100/78 mmHg  Pulse 80  Ht 5' 3.25" (1.607 m)  Wt 115 lb 3.2 oz (52.254 kg)  BMI 20.23 kg/m2  General: thin young man, seated on the examination table, in no distress, right-handed, sandy hair, blue/brown eyes; Awake, alert, animated, talkative.  Head: head microcephalic and atraumatic, His head is slightly turned with the right ear toward his right shoulder and chin points the left. He does not have torticollis. Left face and neck are less swollen than last visit  Ears, Nose and Throat: beak-like nose. He has a low lying palate. Wax occluding external auditory canals  Neck: supple with no carotid or supraclavicular bruits.  Respiratory: Lungs are clear to auscultation  Cardiovascular: regular rate and rhythm, soft holosystolic murmur at left sternal border. His nailbeds are pink.  Musculoskeletal: short stature with head tilted to the right ear. He has tight heel cords; left eqinus deformity of his foot.  Skin: no rashes or lesions.  Trunk: no scoliosis. His portacath in the right upper chest. He has a vagal nerve stimulator in the left upper chest.  Neurologic Exam  Mental Status: Awake, alert. oriented to place. Attention span, concentration, and fund of knowledge are below average. He has evidence of mild intellectual disability. Mood and affect appropriate. 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 Pupils equal, briskly reactive to light. Extraocular movements full but dysconjugate. Visual fields full to confrontation. Hearing intact to air conduction greater than bone conduction. Facial sensation intact. Face, tongue, palate move normally and symmetrically. Neck flexion and extension normal. 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 He had a slight tic-like movement of his mouth.   Motor: Diminished bulk and normal tone. The patient has diffuse mild weakness in his proximal and distal muscles in the 4-4+ over 5 range.He had normal strength in his legs. He has clumsy fine motor movements. His hands are semiflexed at the distal joints at rest but he can extend them.  Sensory: Intact to light touch,cold, vibration, and proprioception in all extremities; good stereognosis  Coordination: Rapid alternating movements are clumsy in all extremities. Finger-to-nose and heel-to-shin performed accurately bilaterally. Romberg negative.  Gait and Station: Right foot is in a somewhat valgus position, left is equinovarus but the heel strike for both feet is good. His gait is slightly broad-based and was ataxic Reflexes: Absent and symmetric. Toes downgoing.  Assessment 1. 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 2. 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  Discussion I refilled prescriptions that are running out.  I do not think that there is anything to change at this time because he is on maximal therapy and has been on virtually every major antiepileptic medication useful for controlled complex partial seizures.  His vagal nerve stimulator is near maximum output.  His migraines are not as frequent as they had been.  Plan I refilled  prescriptions as noted above.  I spent 30 minutes of face-to-face time with [name] and his parents.  He will return in three months for routine evaluation and to reprogram and assess his vagal nerve stimulator.   Medication List   This list is accurate as of: 01/07/16 11:59 PM.       acetaminophen 500 MG tablet  Commonly known as:  TYLENOL  Take 1,000 mg by mouth every 6 (six) hours as needed for headache.     carbamazepine 100 MG chewable tablet  Commonly known as:  TEGRETOL  Take 2-1/2 tablets 3 times daily     cetirizine 10 MG tablet  Commonly known as:  ZYRTEC  Take 10 mg by mouth daily.     CRESTOR  10 MG tablet  Generic drug:  rosuvastatin  Take 10 mg by mouth at bedtime.     diazepam 1 MG/ML solution  Commonly known as:  VALIUM  Take 2 mg by mouth every 8 (eight) hours as needed for anxiety.     guaifenesin 400 MG Tabs tablet  Commonly known as:  HUMIBID E  Take 400 mg by mouth 2 (two) times daily.     KEPPRA 500 MG tablet  Generic drug:  levETIRAcetam  TAKE 3 TABLETS BY MOUTH TWICE DAILY.     KLOR-CON M20 20 MEQ tablet  Generic drug:  potassium chloride SA  TAKE 1 TABLET BY MOUTH 4 TIMES A DAY     lacosamide 50 MG Tabs tablet  Commonly known as:  VIMPAT  Take 2 in the AM, 1 at lunch, then 2 at night     levothyroxine 25 MCG tablet  Commonly known as:  SYNTHROID, LEVOTHROID  Take 25 mcg by mouth daily before breakfast.     LORazepam 2 MG/ML concentrated solution  Commonly known as:  ATIVAN  Withdraw 73ml (2mg ) and 71ml  Normal saline into 3 ml syringe. Administer IV push over 3-5 minutes as needed for recurrent seizures     montelukast 10 MG tablet  Commonly known as:  SINGULAIR  Take 10 mg by mouth at bedtime.     multivitamin with minerals Tabs tablet  Take 1 tablet by mouth daily.     ondansetron 4 MG disintegrating tablet  Commonly known as:  ZOFRAN-ODT  Take 1 tablet as needed for nausea and vomiting     PHENObarbital 65 MG/ML injection  Commonly known as:  LUMINAL  Withdraw 15ml (65mg ) Phenobarbital and 37ml Normal Saline into 2ml syringe. Adminster IV push for 3-5 minutes as needed for recurrent seizures     potassium phosphate (monobasic) 500 MG tablet  Commonly known as:  K-PHOS ORIGINAL  Take 1 tablet (500 mg total) by mouth 4 (four) times daily.     Topiramate ER 200 MG Cp24  Commonly known as:  TROKENDI XR  Take 1 capsule by mouth at bedtime.     TROKENDI XR 50 MG Cp24  Generic drug:  Topiramate ER  Take 1 at night      The medication list was reviewed and reconciled. All changes or newly prescribed medications were explained.  A complete  medication list was provided to the patient/caregiver.  Jodi Geralds MD

## 2016-01-18 ENCOUNTER — Other Ambulatory Visit: Payer: Self-pay | Admitting: Family

## 2016-01-29 ENCOUNTER — Other Ambulatory Visit: Payer: Self-pay | Admitting: Pediatrics

## 2016-02-01 ENCOUNTER — Emergency Department (HOSPITAL_COMMUNITY)
Admission: EM | Admit: 2016-02-01 | Discharge: 2016-02-01 | Disposition: A | Discharge status: Home / Self Care | Payer: Medicaid Other | Attending: Emergency Medicine | Admitting: Emergency Medicine

## 2016-02-01 ENCOUNTER — Encounter (HOSPITAL_COMMUNITY): Payer: Self-pay | Admitting: Emergency Medicine

## 2016-02-01 ENCOUNTER — Emergency Department (HOSPITAL_COMMUNITY): Payer: Medicaid Other

## 2016-02-01 DIAGNOSIS — R0681 Apnea, not elsewhere classified: Secondary | ICD-10-CM | POA: Diagnosis not present

## 2016-02-01 DIAGNOSIS — Z8659 Personal history of other mental and behavioral disorders: Secondary | ICD-10-CM | POA: Insufficient documentation

## 2016-02-01 DIAGNOSIS — R011 Cardiac murmur, unspecified: Secondary | ICD-10-CM | POA: Diagnosis not present

## 2016-02-01 DIAGNOSIS — E039 Hypothyroidism, unspecified: Secondary | ICD-10-CM | POA: Diagnosis not present

## 2016-02-01 DIAGNOSIS — R410 Disorientation, unspecified: Secondary | ICD-10-CM | POA: Insufficient documentation

## 2016-02-01 DIAGNOSIS — R079 Chest pain, unspecified: Secondary | ICD-10-CM | POA: Insufficient documentation

## 2016-02-01 DIAGNOSIS — Z79899 Other long term (current) drug therapy: Secondary | ICD-10-CM | POA: Insufficient documentation

## 2016-02-01 DIAGNOSIS — Z8679 Personal history of other diseases of the circulatory system: Secondary | ICD-10-CM | POA: Diagnosis not present

## 2016-02-01 DIAGNOSIS — E785 Hyperlipidemia, unspecified: Secondary | ICD-10-CM | POA: Insufficient documentation

## 2016-02-01 DIAGNOSIS — R569 Unspecified convulsions: Secondary | ICD-10-CM | POA: Diagnosis present

## 2016-02-01 NOTE — ED Notes (Signed)
Pt here from home , pt had a seizure lasting approx 10  mins , pt then went into resp arrest and lost pulses , Dad started cpr and did chest compressions for approx 8 mins , upon ems arrival pt had regained pulses and was breathing on his on

## 2016-02-01 NOTE — ED Provider Notes (Signed)
CSN: [id number]     Arrival date & time 02/01/16  1058 History   First MD Initiated Contact with Patient 02/01/16 1104     Chief Complaint  Patient presents with  . Seizures     Patient is a 30 y.o. male presenting with seizures. The history is provided by the patient and a parent.  Seizures Seizure activity on arrival: no   Postictal symptoms: confusion   Return to baseline: yes   Severity:  Severe Duration:  10 minutes Progression:  Resolved Patient with multiple medical problems including seizure disorder, cognitive delay presents from for home for seizures and also cardiac arrest Per father, pt began having seizures, and after about 2 minutes he was apneic and without pulses Family started mouth to mouth resucitation and also CPR After 8 minutes he regained pulses Total seizure time approximately 10 minutes He is now at baseline He has been med compliant except for this morning as he had a seizure  Pt is well known to pediatric neuro service He has had episodes previously just like today with unresponsive after seizure He is now awake/alert  No recent illnesses, pt has otherwise been at baseline Last seizure was last weekend Past Medical History  Diagnosis Date  . ALLERGIC RHINITIS 12/26/2009  . MITRAL VALVE PROLAPSE 06/22/2007  . SEIZURE DISORDER 06/22/2007  . Unspecified hypothyroidism 06/22/2007  . Growth disorder   . Mental retardation, mild (I.Q. 50-70)   . Brown's syndrome   . Heart murmur     moderate MR  . Sleep apnea     cpap , last sleep study 10/01/11  . Hypokalemia   . Hyperlipidemia     No therapy  . Blood clot in vein    Past Surgical History  Procedure Laterality Date  . Implantation vagal nerve stimulator      Left  . Portacath placement      and removal 04/08/12  . Eye surgery      X2 for Brown Syndrome   Family History  Problem Relation Age of Onset  . Breast cancer Mother   . Asthma Father   . Diabetes Father   . Coronary artery disease  Maternal Grandfather   . Congestive Heart Failure Maternal Grandfather     Died at 16  . Pneumonia Maternal Grandfather   . Coronary artery disease Paternal Grandfather   . Lung cancer Paternal Grandfather     Died at 22  . Pneumonia Paternal Grandmother     Died at 47  . Stroke Paternal Grandmother    Social History  Substance Use Topics  . Smoking status: Never Smoker   . Smokeless tobacco: Never Used  . Alcohol Use: No    Review of Systems  Constitutional: Negative for fever.  Cardiovascular: Positive for chest pain.       CP from chest compressions   Gastrointestinal: Negative for vomiting.  Neurological: Positive for seizures.  All other systems reviewed and are negative.     Allergies  Antihistamines, chlorpheniramine-type; Divalproex sodium; Phenytoin; Valproic acid and related; and Vancomycin  Home Medications   Prior to Admission medications   Medication Sig Start Date End Date Taking? Authorizing Provider  acetaminophen (TYLENOL) 500 MG tablet Take 1,000 mg by mouth every 6 (six) hours as needed for headache.    Historical Provider, MD  carbamazepine (TEGRETOL) 100 MG chewable tablet Take 2-1/2 tablets 3 times daily 01/07/16   Jodi Geralds, MD  cetirizine (ZYRTEC) 10 MG tablet Take 10 mg by  mouth daily. 02/12/15   Historical Provider, MD  diazepam (VALIUM) 1 MG/ML solution Take 2 mg by mouth every 8 (eight) hours as needed for anxiety.     Historical Provider, MD  guaifenesin (HUMIBID E) 400 MG TABS tablet Take 400 mg by mouth 2 (two) times daily.    Historical Provider, MD  K-PHOS 500 MG tablet TAKE 1 TABLET (500 MG TOTAL) BY MOUTH 4 (FOUR) TIMES DAILY. 01/30/16   Rockwell Germany, NP  KEPPRA 500 MG tablet TAKE 3 TABLETS BY MOUTH TWICE DAILY. 12/19/15   Rockwell Germany, NP  KLOR-CON M20 20 MEQ tablet TAKE 1 TABLET BY MOUTH 4 TIMES A DAY 07/23/15   Rockwell Germany, NP  lacosamide (VIMPAT) 50 MG TABS tablet Take 2 in the AM, 1 at lunch, then 2 at night  01/07/16   Jodi Geralds, MD  levothyroxine (SYNTHROID, LEVOTHROID) 25 MCG tablet Take 25 mcg by mouth daily before breakfast.    Historical Provider, MD  LORazepam (ATIVAN) 2 MG/ML concentrated solution Withdraw 75ml (2mg ) and 65ml  Normal saline into 3 ml syringe. Administer IV push over 3-5 minutes as needed for recurrent seizures 06/26/15   Rockwell Germany, NP  montelukast (SINGULAIR) 10 MG tablet Take 10 mg by mouth at bedtime. 11/09/15   Historical Provider, MD  Multiple Vitamin (MULTIVITAMIN WITH MINERALS) TABS Take 1 tablet by mouth daily.    Historical Provider, MD  ondansetron (ZOFRAN-ODT) 4 MG disintegrating tablet Take 1 tablet as needed for nausea and vomiting 09/27/15   Jodi Geralds, MD  [name] 10 MG tablet TAKE 1/2 TABLET 3 TIMES DAILY 01/18/16   Rockwell Germany, NP  PHENObarbital (LUMINAL) 65 MG/ML injection Withdraw 77ml (65mg ) Phenobarbital and 38ml Normal Saline into 33ml syringe. [place] IV push for 3-5 minutes as needed for recurrent seizures 06/26/15   Rockwell Germany, NP  rosuvastatin (CRESTOR) 10 MG tablet Take 10 mg by mouth at bedtime. 03/13/15 03/12/16  Historical Provider, MD  Topiramate ER (TROKENDI XR) 200 MG CP24 Take 1 capsule by mouth at bedtime. 01/07/16   Jodi Geralds, MD  TROKENDI XR 50 MG CP24 Take 1 at night 01/07/16   Jodi Geralds, MD   BP 115/68 mmHg  Pulse 83  Temp(Src) 97.4 F (36.3 C)  Resp 23  SpO2 100% Physical Exam CONSTITUTIONAL: chronically ill appearing, no distress HEAD: Normocephalic/atraumatic EYES: PERRL ENMT: Mucous membranes moist NECK: supple no meningeal signs SPINE/BACK:entire spine nontender CV: S1/S2 noted, no murmurs/rubs/gallops noted LUNGS: Lungs are clear to auscultation bilaterally, no apparent distress Chest- diffuse tenderness but no crepitus or bruising ABDOMEN: soft, nontender NEURO: Pt is awake/alert.  No distress noted.  He responds to questions.  He can move all extremities EXTREMITIES:  full ROM, chronic  contractures noted to bilateral LE SKIN: warm, color normal PSYCH: no abnormalities of mood noted, alert and oriented to situation  ED Course  Procedures  11:47 AM Pt with long h/o seizures He will have episodes of arrest during seizures He is now at baseline I had long conversation with dr hickling who has known this patient since age 2 He recommends CXR to r/o aspiration Monitor for 2 hrs If no issues and pt at baseline he can be discharged Father is comfortable with this plan Labs are not recommend by neuro Family is comfortable with plan Will start home meds once mom arrives 1:28 PM Pt stable No further seizure activity He is at baseline CXR negative No hypoxia here Parents would like to take patient They feel  comfortable managing any other seizures at home BP 103/71 mmHg  Pulse 79  Temp(Src) 97.4 F (36.3 C)  Resp 22  SpO2 100%   Imaging Review Dg Chest Portable 1 View  02/01/2016  CLINICAL DATA:  Cardiopulmonary resuscitation.  Seizure. EXAM: PORTABLE CHEST 1 VIEW COMPARISON:  November 22, 2015 FINDINGS: Lungs are clear. Heart is upper normal in size with pulmonary vascularity within normal limits. No adenopathy. No pneumothorax. [name] tip is in the superior vena cava. There is a stimulator with leads in the medial left neck at the level of C7. There is degenerative change in each shoulder. No fractures are evident. IMPRESSION: No edema or consolidation. No pneumothorax. Stable cardiac silhouette. Electronically Signed   By: Lowella Grip [name] M.D.   On: 02/01/2016 11:55   I have personally reviewed and evaluated these images results as part of my medical decision-making.   EKG Interpretation   Date/Time:  Friday Feb 01 2016 10:58:53 EDT Ventricular Rate:  86 PR Interval:  181 QRS Duration: 100 QT Interval:  373 QTC Calculation: 446 R Axis:   64 Text Interpretation:  Sinus rhythm Probable left ventricular hypertrophy  No significant change since last  tracing Confirmed by Christy Gentles  MD, Elenore Rota  [phone]) on 02/01/2016 11:08:28 AM      MDM   Final diagnoses:  Seizure ([place])  Apneic episode    Nursing notes including past medical history and social history reviewed and considered in documentation xrays/imaging reviewed by myself and considered during evaluation     Ripley Fraise, MD 02/01/16 1331

## 2016-02-01 NOTE — Discharge Instructions (Signed)
°  Epilepsy People with epilepsy have times when they shake and jerk uncontrollably (seizures). This happens when there is a sudden change in brain function. Epilepsy may have many possible causes. Anything that disturbs the normal pattern of brain cell activity can lead to seizures. HOME CARE   Follow your doctor's instructions about driving and safety during normal activities.  Get enough sleep.  Only take medicine as told by your doctor.  Avoid things that you know can cause you to have seizures (triggers).  Write down when your seizures happen and what you remember about each seizure. Write down anything you think may have caused the seizure to happen.  Tell the people you live and work with that you have seizures. Make sure they know how to help you. They should:  Cushion your head and body.  Turn you on your side.  Not restrain you.  Not place anything inside your mouth.  Call for local emergency medical help if there is any question about what has happened.  Keep all follow-up visits with your doctor. This is very important. GET HELP IF:  You get an infection or start to feel sick. You may have more seizures when you are sick.  You are having seizures more often.  Your seizure pattern is changing. GET HELP RIGHT AWAY IF:   A seizure does not stop after a few seconds or minutes.  A seizure causes you to have trouble breathing.  A seizure gives you a very bad headache.  A seizure makes you unable to speak or use a part of your body.   This information is not intended to replace advice given to you by your health care provider. Make sure you discuss any questions you have with your health care provider.   Document Released: 07/06/2009 Document Revised: 06/29/2013 Document Reviewed: 04/20/2013 Elsevier Interactive Patient Education [employer].

## 2016-02-04 ENCOUNTER — Telehealth: Payer: Self-pay

## 2016-02-04 DIAGNOSIS — G40401 Other generalized epilepsy and epileptic syndromes, not intractable, with status epilepticus: Secondary | ICD-10-CM

## 2016-02-04 DIAGNOSIS — G40319 Generalized idiopathic epilepsy and epileptic syndromes, intractable, without status epilepticus: Secondary | ICD-10-CM

## 2016-02-04 MED ORDER — LORAZEPAM 2 MG/ML PO CONC: ORAL | Status: DC

## 2016-02-04 MED ORDER — PHENOBARBITAL SODIUM 65 MG/ML IJ SOLN
INTRAMUSCULAR | Status: DC
Start: 2016-02-04 — End: 2016-11-20

## 2016-02-04 NOTE — Telephone Encounter (Signed)
Received vm from [place] at [facility] requesting refill on pt's Lorazepam and Phenobarbital. CB# [phone]

## 2016-02-04 NOTE — Telephone Encounter (Signed)
I faxed the Rx to [facility] at the [facility] pharmacy at fax # [phone]. TG

## 2016-02-11 ENCOUNTER — Other Ambulatory Visit: Payer: Self-pay | Admitting: Family

## 2016-03-04 NOTE — Telephone Encounter (Signed)
Noted, thank you

## 2016-03-04 NOTE — Telephone Encounter (Signed)
Mom contacted me to let me know that [name] is being admitted to [facility] June 19-23rd by Dr Lisabeth Devoid. TG

## 2016-03-13 ENCOUNTER — Telehealth: Payer: Self-pay | Admitting: Family

## 2016-03-13 NOTE — Telephone Encounter (Signed)
Mom DJ Ekstrom left message saying that [name] had 1 seizure at [facility] in which he stopped breathing, and 2 other brief seizures. Dr Lisabeth Devoid reduced the Carbamazepine dose by 150mg  and increased the [name] to 10mg  for the bedtime dose. He is being discharged today with a heart monitor to wear for 4 weeks, then he will return to see Dr Lisabeth Devoid in August. Mom will continue to update about his condition. TG

## 2016-03-13 NOTE — Telephone Encounter (Signed)
Noted, thank you

## 2016-03-26 ENCOUNTER — Telehealth: Payer: Self-pay | Admitting: Family

## 2016-03-26 DIAGNOSIS — G40119 Localization-related (focal) (partial) symptomatic epilepsy and epileptic syndromes with simple partial seizures, intractable, without status epilepticus: Secondary | ICD-10-CM

## 2016-03-26 DIAGNOSIS — G40319 Generalized idiopathic epilepsy and epileptic syndromes, intractable, without status epilepticus: Secondary | ICD-10-CM

## 2016-03-27 MED ORDER — DIAZEPAM 2 MG PO TABS: ORAL_TABLET | ORAL | Status: DC

## 2016-03-27 NOTE — Telephone Encounter (Signed)
Mom left a message saying that [name]' Diazepam tablets had expired. I sent in new Rx as requested. TG

## 2016-06-12 ENCOUNTER — Emergency Department (HOSPITAL_COMMUNITY): Payer: Medicaid Other

## 2016-06-12 ENCOUNTER — Inpatient Hospital Stay (HOSPITAL_COMMUNITY)
Admission: EM | Admit: 2016-06-12 | Discharge: 2016-06-14 | DRG: 194 | Disposition: A | Discharge status: Home / Self Care | Payer: Medicaid Other | Attending: Internal Medicine | Admitting: Internal Medicine

## 2016-06-12 ENCOUNTER — Encounter (HOSPITAL_COMMUNITY): Payer: Self-pay | Admitting: *Deleted

## 2016-06-12 DIAGNOSIS — Z888 Allergy status to other drugs, medicaments and biological substances status: Secondary | ICD-10-CM | POA: Diagnosis not present

## 2016-06-12 DIAGNOSIS — R269 Unspecified abnormalities of gait and mobility: Secondary | ICD-10-CM | POA: Diagnosis present

## 2016-06-12 DIAGNOSIS — I4891 Unspecified atrial fibrillation: Secondary | ICD-10-CM | POA: Diagnosis present

## 2016-06-12 DIAGNOSIS — Z881 Allergy status to other antibiotic agents status: Secondary | ICD-10-CM | POA: Diagnosis not present

## 2016-06-12 DIAGNOSIS — Z7951 Long term (current) use of inhaled steroids: Secondary | ICD-10-CM

## 2016-06-12 DIAGNOSIS — J309 Allergic rhinitis, unspecified: Secondary | ICD-10-CM | POA: Diagnosis present

## 2016-06-12 DIAGNOSIS — G40409 Other generalized epilepsy and epileptic syndromes, not intractable, without status epilepticus: Secondary | ICD-10-CM | POA: Diagnosis present

## 2016-06-12 DIAGNOSIS — J189 Pneumonia, unspecified organism: Secondary | ICD-10-CM

## 2016-06-12 DIAGNOSIS — E785 Hyperlipidemia, unspecified: Secondary | ICD-10-CM | POA: Diagnosis present

## 2016-06-12 DIAGNOSIS — Z79899 Other long term (current) drug therapy: Secondary | ICD-10-CM

## 2016-06-12 DIAGNOSIS — D649 Anemia, unspecified: Secondary | ICD-10-CM | POA: Diagnosis present

## 2016-06-12 DIAGNOSIS — Z9689 Presence of other specified functional implants: Secondary | ICD-10-CM | POA: Diagnosis present

## 2016-06-12 DIAGNOSIS — J9811 Atelectasis: Secondary | ICD-10-CM | POA: Diagnosis present

## 2016-06-12 DIAGNOSIS — E876 Hypokalemia: Secondary | ICD-10-CM | POA: Diagnosis not present

## 2016-06-12 DIAGNOSIS — I959 Hypotension, unspecified: Secondary | ICD-10-CM | POA: Diagnosis present

## 2016-06-12 DIAGNOSIS — G4733 Obstructive sleep apnea (adult) (pediatric): Secondary | ICD-10-CM | POA: Diagnosis not present

## 2016-06-12 DIAGNOSIS — Z8249 Family history of ischemic heart disease and other diseases of the circulatory system: Secondary | ICD-10-CM

## 2016-06-12 DIAGNOSIS — F70 Mild intellectual disabilities: Secondary | ICD-10-CM | POA: Diagnosis present

## 2016-06-12 DIAGNOSIS — I341 Nonrheumatic mitral (valve) prolapse: Secondary | ICD-10-CM | POA: Diagnosis present

## 2016-06-12 DIAGNOSIS — R6510 Systemic inflammatory response syndrome (SIRS) of non-infectious origin without acute organ dysfunction: Secondary | ICD-10-CM | POA: Diagnosis not present

## 2016-06-12 DIAGNOSIS — G40309 Generalized idiopathic epilepsy and epileptic syndromes, not intractable, without status epilepticus: Secondary | ICD-10-CM | POA: Diagnosis present

## 2016-06-12 DIAGNOSIS — Z833 Family history of diabetes mellitus: Secondary | ICD-10-CM

## 2016-06-12 DIAGNOSIS — Z86718 Personal history of other venous thrombosis and embolism: Secondary | ICD-10-CM | POA: Diagnosis not present

## 2016-06-12 DIAGNOSIS — E039 Hypothyroidism, unspecified: Secondary | ICD-10-CM | POA: Diagnosis present

## 2016-06-12 DIAGNOSIS — Z803 Family history of malignant neoplasm of breast: Secondary | ICD-10-CM

## 2016-06-12 DIAGNOSIS — Z8709 Personal history of other diseases of the respiratory system: Secondary | ICD-10-CM

## 2016-06-12 DIAGNOSIS — Z8674 Personal history of sudden cardiac arrest: Secondary | ICD-10-CM | POA: Diagnosis not present

## 2016-06-12 DIAGNOSIS — Z801 Family history of malignant neoplasm of trachea, bronchus and lung: Secondary | ICD-10-CM

## 2016-06-12 DIAGNOSIS — Z823 Family history of stroke: Secondary | ICD-10-CM

## 2016-06-12 DIAGNOSIS — Z23 Encounter for immunization: Secondary | ICD-10-CM | POA: Diagnosis not present

## 2016-06-12 DIAGNOSIS — Z825 Family history of asthma and other chronic lower respiratory diseases: Secondary | ICD-10-CM

## 2016-06-12 DIAGNOSIS — H5060 Mechanical strabismus, unspecified: Secondary | ICD-10-CM | POA: Diagnosis present

## 2016-06-12 LAB — COMPREHENSIVE METABOLIC PANEL
ALT: 16 U/L — ABNORMAL LOW (ref 17–63)
AST: 16 U/L (ref 15–41)
Albumin: 2.3 g/dL — ABNORMAL LOW (ref 3.5–5.0)
Alkaline Phosphatase: 71 U/L (ref 38–126)
Anion gap: 6 (ref 5–15)
BUN: 5 mg/dL — ABNORMAL LOW (ref 6–20)
CHLORIDE: 112 mmol/L — AB (ref 101–111)
CO2: 18 mmol/L — ABNORMAL LOW (ref 22–32)
Calcium: 7.2 mg/dL — ABNORMAL LOW (ref 8.9–10.3)
Creatinine, Ser: 0.84 mg/dL (ref 0.61–1.24)
Glucose, Bld: 103 mg/dL — ABNORMAL HIGH (ref 65–99)
POTASSIUM: 3.3 mmol/L — AB (ref 3.5–5.1)
SODIUM: 136 mmol/L (ref 135–145)
Total Bilirubin: 0.7 mg/dL (ref 0.3–1.2)
Total Protein: 5.5 g/dL — ABNORMAL LOW (ref 6.5–8.1)

## 2016-06-12 LAB — CBC WITH DIFFERENTIAL/PLATELET
Basophils Absolute: 0 10*3/uL (ref 0.0–0.1)
Basophils Relative: 0 %
Eosinophils Absolute: 0 10*3/uL (ref 0.0–0.7)
Eosinophils Relative: 0 %
HEMATOCRIT: 32.1 % — AB (ref 39.0–52.0)
HEMOGLOBIN: 10.1 g/dL — AB (ref 13.0–17.0)
LYMPHS ABS: 1.9 10*3/uL (ref 0.7–4.0)
LYMPHS PCT: 16 %
MCH: 27.7 pg (ref 26.0–34.0)
MCHC: 31.5 g/dL (ref 30.0–36.0)
MCV: 88.2 fL (ref 78.0–100.0)
Monocytes Absolute: 1.3 10*3/uL — ABNORMAL HIGH (ref 0.1–1.0)
Monocytes Relative: 11 %
NEUTROS ABS: 8.7 10*3/uL — AB (ref 1.7–7.7)
NEUTROS PCT: 73 %
Platelets: 231 10*3/uL (ref 150–400)
RBC: 3.64 MIL/uL — AB (ref 4.22–5.81)
RDW: 13.4 % (ref 11.5–15.5)
WBC: 12 10*3/uL — AB (ref 4.0–10.5)

## 2016-06-12 LAB — URINALYSIS, ROUTINE W REFLEX MICROSCOPIC
Bilirubin Urine: NEGATIVE
GLUCOSE, UA: NEGATIVE mg/dL
HGB URINE DIPSTICK: NEGATIVE
Ketones, ur: NEGATIVE mg/dL
Leukocytes, UA: NEGATIVE
Nitrite: NEGATIVE
Protein, ur: NEGATIVE mg/dL
SPECIFIC GRAVITY, URINE: 1.011 (ref 1.005–1.030)
pH: 7 (ref 5.0–8.0)

## 2016-06-12 LAB — I-STAT CG4 LACTIC ACID, ED
LACTIC ACID, VENOUS: 0.39 mmol/L — AB (ref 0.5–1.9)
LACTIC ACID, VENOUS: 0.44 mmol/L — AB (ref 0.5–1.9)

## 2016-06-12 MED ORDER — IPRATROPIUM-ALBUTEROL 0.5-2.5 (3) MG/3ML IN SOLN
3.0000 mL | Freq: Once | RESPIRATORY_TRACT | Status: AC
  Administered 2016-06-12: 3 mL via RESPIRATORY_TRACT

## 2016-06-12 MED ORDER — IPRATROPIUM-ALBUTEROL 0.5-2.5 (3) MG/3ML IN SOLN
3.0000 mL | Freq: Once | RESPIRATORY_TRACT | Status: AC
  Administered 2016-06-12: 3 mL via RESPIRATORY_TRACT
  Filled 2016-06-12: qty 9

## 2016-06-12 MED ORDER — DEXTROSE 5 % IV SOLN
1.0000 g | Freq: Once | INTRAVENOUS | Status: AC
  Administered 2016-06-12: 1 g via INTRAVENOUS
  Filled 2016-06-12: qty 10

## 2016-06-12 MED ORDER — DEXTROSE 5 % IV SOLN
500.0000 mg | Freq: Once | INTRAVENOUS | Status: AC
  Administered 2016-06-12: 500 mg via INTRAVENOUS
  Filled 2016-06-12: qty 500

## 2016-06-12 NOTE — ED Provider Notes (Signed)
[facility] DEPT Provider Note   CSN: [id number] Arrival date & time: 06/12/16  1704     History   Chief Complaint Chief Complaint  Patient presents with  . Pneumonia    HPI [name] is a 30 y.o. male.  HPI Patient is a 30 year old male with a complicated past medical history including mental retardation, seizure disorder, sleep apnea, Brown syndrome who comes in today brought by his parents with concern for pneumonia.  Family states they went to see the PCP approximately 1 week ago.  Patient was diagnosed with pneumonia and started on antibiotic.  Patient went to [place] with his family where he became febrile once again patient was taken to urgent care which showed worsening pneumonia.  Patient obtained antibiotic regimen which she completed today however parents state patient still febrile and not in his normal state of health.  Family denies abdominal pain nausea or vomiting.  Endorse shortness of breath and fever. Past Medical History:  Diagnosis Date  . ALLERGIC RHINITIS 12/26/2009  . Blood clot in vein   . Brown's syndrome   . Growth disorder   . Heart murmur    moderate MR  . Hyperlipidemia    No therapy  . Hypokalemia   . Mental retardation, mild (I.Q. 50-70)   . MITRAL VALVE PROLAPSE 06/22/2007  . SEIZURE DISORDER 06/22/2007  . Sleep apnea    cpap , last sleep study 10/01/11  . Unspecified hypothyroidism 06/22/2007    Patient Active Problem List   Diagnosis Date Noted  . CAP (community acquired pneumonia) 06/12/2016  . Anemia 06/12/2016  . 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 Encounter for feeding tube placement   . Cardiac arrest ([place]) 11/03/2015  . Acute respiratory failure ([place])   . Acute respiratory failure with hypercapnia ([place])   . Impaired oral gastric feeding tube   . Respiratory arrest ([place]) 09/29/2015  . Seizure ([place]) 09/29/2015  . 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 Encounter for  therapeutic drug monitoring 10/24/2013  . Hypokalemia 08/03/2013  . Hypothyroidism 08/02/2013  . Subtherapeutic international normalized ratio (INR) 08/02/2013  . Contracture of ankle and foot joint 03/29/2013  . Abnormality of gait 12/21/2012  . 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 Partial epilepsy with intractable epilepsy ([place]) 12/21/2012  . Obstructive sleep apnea 12/21/2012  . Acute venous embolism and thrombosis of subclavian veins ([place]) 12/21/2012  . Acute venous embolism and thrombosis of internal jugular veins ([place]) 12/21/2012  . 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 Loss of weight 12/21/2012  . Encounter for long-term (current) use of other medications 12/21/2012  . Screening for lipoid disorders 12/21/2012  . Fall resulting in striking against other object 12/21/2012  . Mechanical complication of other vascular device, implant, and graft 12/21/2012  . 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 Pain in soft tissues of limb 11/01/2012  . Long term (current) use of anticoagulants 05/21/2012  . Aspiration pneumonia ([place]) 05/17/2012  . Diarrhea 05/17/2012  . [name] 05/17/2012  . Mitral regurgitation 04/29/2012  . A-fib ([place]) 04/08/2012  . Status epilepticus ([place]) 04/08/2012  . Generalized convulsive epilepsy ([place]) 01/08/2012    Class: Acute  . 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 Partial epilepsy with impairment of consciousness ([place]) 01/08/2012    Class: Chronic  . Irregular heart beat   . DVT of upper extremity (deep vein thrombosis) ([place]) 09/04/2011  .  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 Allergic rhinitis 12/26/2009  . MITRAL VALVE PROLAPSE 06/22/2007  . Seizure disorder ([place]) 06/22/2007    Past Surgical History:  Procedure Laterality Date  . EYE SURGERY     X2 for Brown Syndrome  . IMPLANTATION VAGAL NERVE STIMULATOR     Left  . PORTACATH  PLACEMENT     and removal 04/08/12       Home Medications    Prior to Admission medications   Medication Sig Start Date End Date Taking? Authorizing Provider  acetaminophen (TYLENOL) 500 MG tablet Take 1,000 mg by mouth every 6 (six) hours as needed for headache.   Yes Historical Provider, MD  albuterol (ACCUNEB) 1.25 MG/3ML nebulizer solution Take 3 mLs by nebulization every 6 (six) hours as needed for shortness of breath.  06/02/16 06/02/17 Yes Historical Provider, MD  carbamazepine (TEGRETOL) 100 MG chewable tablet Take 2-1/2 tablets 3 times daily Patient taking differently: Chew 150-250 mg by mouth 3 (three) times daily. Takes 2.5 tabs in am, 2.5 tabs midday and 1.5 tabs in pm 01/07/16  Yes Jodi Geralds, MD  cetirizine (ZYRTEC) 10 MG tablet Take 10 mg by mouth daily. 02/12/15  Yes Historical Provider, MD  diazepam (VALIUM) 2 MG tablet Take 1 tablet every 6 - 8 hours as needed for anxiety or seizures 03/27/16  Yes Rockwell Germany, NP  guaifenesin (HUMIBID E) 400 MG TABS tablet Take 400 mg by mouth 2 (two) times daily.   Yes Historical Provider, MD  K-PHOS 500 MG tablet TAKE 1 TABLET (500 MG TOTAL) BY MOUTH 4 (FOUR) TIMES DAILY. 01/30/16  Yes Rockwell Germany, NP  KEPPRA 500 MG tablet TAKE 3 TABLETS BY MOUTH TWICE DAILY. 12/19/15  Yes Rockwell Germany, NP  KLOR-CON M20 20 MEQ tablet TAKE 1 TABLET BY MOUTH 4 TIMES A DAY 02/11/16  Yes Rockwell Germany, NP  lacosamide (VIMPAT) 50 MG TABS tablet Take 2 in the AM, 1 at lunch, then 2 at night Patient taking differently: Take 50-100 mg by mouth 3 (three) times daily. Take 2 in the AM, 1 at lunch, then 2 at night 01/07/16  Yes Jodi Geralds, MD  levothyroxine (SYNTHROID, LEVOTHROID) 25 MCG tablet Take 25 mcg by mouth daily before breakfast.   Yes Historical Provider, MD  LORazepam (ATIVAN) 2 MG/ML concentrated solution Withdraw 59ml (2mg ) and 76ml  Normal saline into 3 ml syringe. Administer IV push over 3-5 minutes as needed for recurrent seizures  02/04/16  Yes Rockwell Germany, NP  montelukast (SINGULAIR) 10 MG tablet Take 10 mg by mouth at bedtime. 11/09/15  Yes Historical Provider, MD  Multiple Vitamin (MULTIVITAMIN WITH MINERALS) TABS Take 1 tablet by mouth daily.   Yes Historical Provider, MD  ondansetron (ZOFRAN-ODT) 4 MG disintegrating tablet Take 1 tablet as needed for nausea and vomiting 09/27/15  Yes Jodi Geralds, MD  [name] 10 MG tablet TAKES 1 TAB 3 TIMES DAILY 03/13/16  Yes Rockwell Germany, NP  PHENObarbital (LUMINAL) 65 MG/ML injection Withdraw 72ml (65mg ) Phenobarbital and 7ml Normal Saline into 71ml syringe. [place] IV push for 3-5 minutes as needed for recurrent seizures 02/04/16  Yes Rockwell Germany, NP  rosuvastatin (CRESTOR) 10 MG tablet Take 10 mg by mouth at bedtime. 05/26/16  Yes Historical Provider, MD  Topiramate ER (TROKENDI XR) 200 MG CP24 Take 1 capsule by mouth at bedtime. 01/07/16  Yes Jodi Geralds, MD  TROKENDI XR 50 MG CP24 Take 1 at night Patient taking differently: Take 50 mg by mouth at bedtime.  Take 1 at night 01/07/16  Yes Jodi Geralds, MD  amoxicillin-clavulanate (AUGMENTIN) 875-125 MG tablet Take 1 tablet by mouth 2 (two) times daily. 06/02/16   Historical Provider, MD    Family History Family History  Problem Relation Age of Onset  . Breast cancer Mother   . Asthma Father   . Diabetes Father   . Coronary artery disease Maternal Grandfather   . Congestive Heart Failure Maternal Grandfather     Died at 74  . Pneumonia Maternal Grandfather   . Coronary artery disease Paternal Grandfather   . Lung cancer Paternal Grandfather     Died at 34  . Pneumonia Paternal Grandmother     Died at 42  . Stroke Paternal Grandmother     Social History Social History  Substance Use Topics  . Smoking status: Never Smoker  . Smokeless tobacco: Never Used  . Alcohol use No     Allergies   Antihistamines, chlorpheniramine-type; Divalproex sodium; Phenytoin; Valproic acid and related; and  Vancomycin   Review of Systems Review of Systems  Constitutional: Positive for appetite change, chills, fatigue and fever.  Respiratory: Positive for cough, chest tightness, shortness of breath and wheezing.   Cardiovascular: Negative for chest pain.  Gastrointestinal: Negative for abdominal pain.  Genitourinary: Negative for dysuria.  All other systems reviewed and are negative.    Physical Exam Updated Vital Signs BP 112/63   Pulse 104   Temp 99.3 F (37.4 C) (Oral)   Resp 23   Ht 5\' 3"  (1.6 m)   Wt 55.8 kg   SpO2 92%   BMI 21.79 kg/m   Physical Exam  Constitutional: He appears well-developed and well-nourished.  HENT:  Head: Normocephalic and atraumatic.  Eyes: Conjunctivae are normal.  Neck: Neck supple.  Cardiovascular: Normal rate and regular rhythm.   No murmur heard. Pulmonary/Chest: Effort normal. No respiratory distress. He has wheezes.  Abdominal: Soft. There is no tenderness.  Musculoskeletal: He exhibits no edema.  Neurological: He is alert.  Skin: Skin is warm and dry.  Psychiatric: He has a normal mood and affect.  Nursing note and vitals reviewed.    ED Treatments / Results  Labs (all labs ordered are listed, but only abnormal results are displayed) Labs Reviewed  COMPREHENSIVE METABOLIC PANEL - Abnormal; Notable for the following:       Result Value   Potassium 3.3 (*)    Chloride 112 (*)    CO2 18 (*)    Glucose, Bld 103 (*)    BUN 5 (*)    Calcium 7.2 (*)    Total Protein 5.5 (*)    Albumin 2.3 (*)    ALT 16 (*)    All other components within normal limits  CBC WITH DIFFERENTIAL/PLATELET - Abnormal; Notable for the following:    WBC 12.0 (*)    RBC 3.64 (*)    Hemoglobin 10.1 (*)    HCT 32.1 (*)    Neutro Abs 8.7 (*)    Monocytes Absolute 1.3 (*)    All other components within normal limits  I-STAT CG4 LACTIC ACID, ED - Abnormal; Notable for the following:    Lactic Acid, Venous 0.39 (*)    All other components within normal  limits  I-STAT CG4 LACTIC ACID, ED - Abnormal; Notable for the following:    Lactic Acid, Venous 0.44 (*)    All other components within normal limits  CULTURE, BLOOD (ROUTINE X 2)  CULTURE, BLOOD (ROUTINE X 2)  URINE  CULTURE  URINALYSIS, ROUTINE W REFLEX MICROSCOPIC (NOT AT [facility])  CARBAMAZEPINE LEVEL, TOTAL  MAGNESIUM  PHOSPHORUS    EKG  EKG Interpretation None       Radiology Dg Chest 2 View  Result Date: 06/12/2016 CLINICAL DATA:  Bronchitis. EXAM: CHEST  2 VIEW COMPARISON:  02/01/2016 FINDINGS: 1822 hours. The cardio pericardial silhouette is enlarged. Battery pack for cervical stimulator device identified over the left hemi thorax. Right [name] tip overlies the distal SVC level near the junction with the RA. Right infrahilar opacity may be related to atelectasis or infiltrate. Bones are diffusely demineralized. IMPRESSION: Cardiomegaly with atelectasis or infiltrate medial right lung base. Electronically Signed   By: Misty Stanley M.D.   On: 06/12/2016 19:13    Procedures Procedures (including critical care time)  Medications Ordered in ED Medications  cefTRIAXone (ROCEPHIN) 1 g in dextrose 5 % 50 mL IVPB (0 g Intravenous Stopped 06/12/16 2057)  azithromycin (ZITHROMAX) 500 mg in dextrose 5 % 250 mL IVPB (0 mg Intravenous Stopped 06/12/16 2057)  ipratropium-albuterol (DUONEB) 0.5-2.5 (3) MG/3ML nebulizer solution 3 mL (3 mLs Nebulization Given 06/12/16 2008)  ipratropium-albuterol (DUONEB) 0.5-2.5 (3) MG/3ML nebulizer solution 3 mL (3 mLs Nebulization Given 06/12/16 2008)  ipratropium-albuterol (DUONEB) 0.5-2.5 (3) MG/3ML nebulizer solution 3 mL (3 mLs Nebulization Given 06/12/16 2008)     Initial Impression / Assessment and Plan / ED Course  I have reviewed the triage vital signs and the nursing notes.  Pertinent labs & imaging results that were available during my care of the patient were reviewed by me and considered in my medical decision making (see chart for  details).  Clinical Course    Patient is a 30 year old male with a complicated past medical history including mental retardation, seizure disorder, sleep apnea, Brown syndrome who comes in today brought by his parents with concern for pneumonia.  Family states they went to see the PCP approximately 1 week ago.  Patient was diagnosed with pneumonia and started on antibiotic.  Patient went to [place] with his family where he became febrile once again patient was taken to urgent care which showed worsening pneumonia.  Patient obtained antibiotic regimen which she completed today however parents state patient still febrile and not in his normal state of health.  Family denies abdominal pain nausea or vomiting.  Endorse shortness of breath and fever.  Physical exam: Patient with decreased right lower lung sounds with diffuse wheezing throughout.  Abdomen soft nontender heart tones normal.  Remainder physical exam within normal limits.  We will begin patient on empiric coverage for pneumonia.  Given patient's hypotension, code sepsis was called.  Started pt on ABX and duonebs. Pt admitted to hospitalist service for further care.   Final Clinical Impressions(s) / ED Diagnoses   Final diagnoses:  None    New Prescriptions New Prescriptions   No medications on file     Chapman Moss, MD 06/12/16 [id number]    Varney Biles, MD 06/19/16 1453

## 2016-06-12 NOTE — ED Triage Notes (Signed)
Mom st's pt was being tx for bronchitis with antibiotics.  While out of town pt started to run a fever and was seen at a Urgent Care in [place] and dx with pneumonia 1 week ago.  Mom st[facility] she took him to his MD today and they had him transported to ED.  Last Tylenol was at 3:40pm today

## 2016-06-12 NOTE — H&P (Signed)
History and Physical    [name] [id number] DOB: 03-19-1986 DOA: 06/12/2016  PCP: Chesley Noon, MD   Patient coming from: Home.  Chief Complaint: Pneumonia.  Source of history: Patient's mother.  HPI: [name] is a 30 y.o. male with medical history significant of generalized convulsive epilepsy, allergic rhinitis, DVT, Brown's syndrome, growth disorder, MVP, mitral regurgitation, sleep apnea, hyperlipidemia, hypokalemia who is brought to the emergency department by his parents for evaluation of pneumonia.  Per patient's mother, they just recently returned from a week trip to [place], due to the patient's maternal grandmother illness. While they were in [place], the patient was diagnosed about a week ago with pneumonia and was discharged home on Augmentin. However, despite taking antibiotics for a week, the patient continues to have decreased appetite, dyspnea and low-grade fevers.  ED Course: The patient received 3 DuoNeb treatments, Rocephin and ceftriaxone. Workup shows WBC of 12.0, myoglobin level of 10.1 g/dL, potassium of 3.3 mmol/L, CO2 of 18 mmol/L. Lactic acid 2 was below normal level. Chest radiograph shows atelectasis versus infiltrate of right middle base.  Review of Systems: As per HPI otherwise 10 point review of systems negative.    Past Medical History:  Diagnosis Date  . ALLERGIC RHINITIS 12/26/2009  . Blood clot in vein   . Brown's syndrome   . Growth disorder   . Heart murmur    moderate MR  . Hyperlipidemia    No therapy  . Hypokalemia   . Mental retardation, mild (I.Q. 50-70)   . MITRAL VALVE PROLAPSE 06/22/2007  . SEIZURE DISORDER 06/22/2007  . Sleep apnea    cpap , last sleep study 10/01/11  . Unspecified hypothyroidism 06/22/2007    Past Surgical History:  Procedure Laterality Date  . EYE SURGERY     X2 for Brown Syndrome  . IMPLANTATION VAGAL NERVE STIMULATOR     Left  . PORTACATH PLACEMENT     and removal  04/08/12     reports that he has never smoked. He has never used smokeless tobacco. He reports that he does not drink alcohol or use drugs.  Allergies  Allergen Reactions  . Antihistamines, Chlorpheniramine-Type Other (See Comments)    Other Reaction: Dimetapp causes seizures  . Divalproex Sodium Other (See Comments)    Nausea, vomiting, liver and kidney dysfunction  . Phenytoin Rash and Other (See Comments)    Dilantin causes more seizures.   . Valproic Acid And Related Other (See Comments)    Shuts down systems  . Vancomycin Rash, Other (See Comments) and Swelling    If given too fast, causes red man reaction Other Reaction: Redman's Syndrome    Family History  Problem Relation Age of Onset  . Breast cancer Mother   . Asthma Father   . Diabetes Father   . Coronary artery disease Maternal Grandfather   . Congestive Heart Failure Maternal Grandfather     Died at 12  . Pneumonia Maternal Grandfather   . Coronary artery disease Paternal Grandfather   . Lung cancer Paternal Grandfather     Died at 94  . Pneumonia Paternal Grandmother     Died at 3  . Stroke Paternal Grandmother     Prior to Admission medications   Medication Sig Start Date End Date Taking? Authorizing Provider  acetaminophen (TYLENOL) 500 MG tablet Take 1,000 mg by mouth every 6 (six) hours as needed for headache.   Yes Historical Provider, MD  albuterol (ACCUNEB) 1.25 MG/3ML nebulizer solution  Take 3 mLs by nebulization every 6 (six) hours as needed for shortness of breath.  06/02/16 06/02/17 Yes Historical Provider, MD  carbamazepine (TEGRETOL) 100 MG chewable tablet Take 2-1/2 tablets 3 times daily Patient taking differently: Chew 150-250 mg by mouth 3 (three) times daily. Takes 2.5 tabs in am, 2.5 tabs midday and 1.5 tabs in pm 01/07/16  Yes Jodi Geralds, MD  cetirizine (ZYRTEC) 10 MG tablet Take 10 mg by mouth daily. 02/12/15  Yes Historical Provider, MD  diazepam (VALIUM) 2 MG tablet Take 1 tablet  every 6 - 8 hours as needed for anxiety or seizures 03/27/16  Yes Rockwell Germany, NP  guaifenesin (HUMIBID E) 400 MG TABS tablet Take 400 mg by mouth 2 (two) times daily.   Yes Historical Provider, MD  K-PHOS 500 MG tablet TAKE 1 TABLET (500 MG TOTAL) BY MOUTH 4 (FOUR) TIMES DAILY. 01/30/16  Yes Rockwell Germany, NP  KEPPRA 500 MG tablet TAKE 3 TABLETS BY MOUTH TWICE DAILY. 12/19/15  Yes Rockwell Germany, NP  KLOR-CON M20 20 MEQ tablet TAKE 1 TABLET BY MOUTH 4 TIMES A DAY 02/11/16  Yes Rockwell Germany, NP  lacosamide (VIMPAT) 50 MG TABS tablet Take 2 in the AM, 1 at lunch, then 2 at night Patient taking differently: Take 50-100 mg by mouth 3 (three) times daily. Take 2 in the AM, 1 at lunch, then 2 at night 01/07/16  Yes Jodi Geralds, MD  levothyroxine (SYNTHROID, LEVOTHROID) 25 MCG tablet Take 25 mcg by mouth daily before breakfast.   Yes Historical Provider, MD  LORazepam (ATIVAN) 2 MG/ML concentrated solution Withdraw 43ml (2mg ) and 88ml  Normal saline into 3 ml syringe. Administer IV push over 3-5 minutes as needed for recurrent seizures 02/04/16  Yes Rockwell Germany, NP  montelukast (SINGULAIR) 10 MG tablet Take 10 mg by mouth at bedtime. 11/09/15  Yes Historical Provider, MD  Multiple Vitamin (MULTIVITAMIN WITH MINERALS) TABS Take 1 tablet by mouth daily.   Yes Historical Provider, MD  ondansetron (ZOFRAN-ODT) 4 MG disintegrating tablet Take 1 tablet as needed for nausea and vomiting 09/27/15  Yes Jodi Geralds, MD  [name] 10 MG tablet TAKES 1 TAB 3 TIMES DAILY 03/13/16  Yes Rockwell Germany, NP  PHENObarbital (LUMINAL) 65 MG/ML injection Withdraw 9ml (65mg ) Phenobarbital and 47ml Normal Saline into 53ml syringe. Adminster IV push for 3-5 minutes as needed for recurrent seizures 02/04/16  Yes Rockwell Germany, NP  rosuvastatin (CRESTOR) 10 MG tablet Take 10 mg by mouth at bedtime. 05/26/16  Yes Historical Provider, MD  Topiramate ER (TROKENDI XR) 200 MG CP24 Take 1 capsule by mouth at bedtime.  01/07/16  Yes Jodi Geralds, MD  TROKENDI XR 50 MG CP24 Take 1 at night Patient taking differently: Take 50 mg by mouth at bedtime. Take 1 at night 01/07/16  Yes Jodi Geralds, MD  amoxicillin-clavulanate (AUGMENTIN) 875-125 MG tablet Take 1 tablet by mouth 2 (two) times daily. 06/02/16   Historical Provider, MD    Physical Exam:  Constitutional: NAD, calm, comfortable Vitals:   06/12/16 2115 06/12/16 2130 06/12/16 2200 06/12/16 2230  BP: 101/61 (!) 91/52 109/56 98/57  Pulse: 98 100 105 103  Resp: 22 21 23 24   Temp:      TempSrc:      SpO2: 90% 100% (!) 88% 97%  Weight:      Height:       Eyes: PERRL, lids and conjunctivae normal ENMT: Mucous membranes are moist. Lips are dry. Posterior pharynx clear  of any exudate or lesions. Neck: normal, supple, no masses, no thyromegaly Respiratory: Decreased breath sounds on bases and mild wheezing bilaterally, no crackles. Normal respiratory effort. No accessory muscle use.  Cardiovascular: Regular rate and rhythm, no murmurs / rubs / gallops. No extremity edema. 2+ pedal pulses. No carotid bruits.  Abdomen: Soft, no tenderness, no masses palpated. No hepatosplenomegaly. Bowel sounds positive.  Musculoskeletal: no clubbing / cyanosis, normal muscle tone.  Skin: no rashes, lesions, ulcers on limited skin exam. Neurologic: Sleeping. Unable to evaluate. Psychiatric: Unable to evaluate as the patient is sleeping.   Labs on Admission: I have personally reviewed following labs and imaging studies  CBC:  Recent Labs Lab 06/12/16 1735  WBC 12.0*  NEUTROABS 8.7*  HGB 10.1*  HCT 32.1*  MCV 88.2  PLT [id number]   Basic Metabolic Panel:  Recent Labs Lab 06/12/16 1735  NA 136  K 3.3*  CL 112*  CO2 18*  GLUCOSE 103*  BUN 5*  CREATININE 0.84  CALCIUM 7.2*   GFR: Estimated Creatinine Clearance: 103.3 mL/min (by C-G formula based on SCr of 0.84 mg/dL). Liver Function Tests:  Recent Labs Lab 06/12/16 1735  AST 16  ALT 16*    ALKPHOS 71  BILITOT 0.7  PROT 5.5*  ALBUMIN 2.3*   No results for input(s): LIPASE, AMYLASE in the last 168 hours. No results for input(s): AMMONIA in the last 168 hours. Coagulation Profile: No results for input(s): INR, PROTIME in the last 168 hours. Cardiac Enzymes: No results for input(s): CKTOTAL, CKMB, CKMBINDEX, TROPONINI in the last 168 hours. BNP (last 3 results) No results for input(s): PROBNP in the last 8760 hours. HbA1C: No results for input(s): HGBA1C in the last 72 hours. CBG: No results for input(s): GLUCAP in the last 168 hours. Lipid Profile: No results for input(s): CHOL, HDL, LDLCALC, TRIG, CHOLHDL, LDLDIRECT in the last 72 hours. Thyroid Function Tests: No results for input(s): TSH, T4TOTAL, FREET4, T3FREE, THYROIDAB in the last 72 hours. Anemia Panel: No results for input(s): VITAMINB12, FOLATE, FERRITIN, TIBC, IRON, RETICCTPCT in the last 72 hours. Urine analysis:    Component Value Date/Time   COLORURINE YELLOW 06/12/2016 1930   APPEARANCEUR CLEAR 06/12/2016 1930   LABSPEC 1.011 06/12/2016 1930   PHURINE 7.0 06/12/2016 1930   GLUCOSEU NEGATIVE 06/12/2016 1930   HGBUR NEGATIVE 06/12/2016 1930   BILIRUBINUR NEGATIVE 06/12/2016 1930   KETONESUR NEGATIVE 06/12/2016 1930   PROTEINUR NEGATIVE 06/12/2016 1930   UROBILINOGEN 0.2 04/21/2014 2218   NITRITE NEGATIVE 06/12/2016 1930   LEUKOCYTESUR NEGATIVE 06/12/2016 1930    Radiological Exams on Admission: Dg Chest 2 View  Result Date: 06/12/2016 CLINICAL DATA:  Bronchitis. EXAM: CHEST  2 VIEW COMPARISON:  02/01/2016 FINDINGS: 1822 hours. The cardio pericardial silhouette is enlarged. Battery pack for cervical stimulator device identified over the left hemi thorax. Right [name] tip overlies the distal SVC level near the junction with the RA. Right infrahilar opacity may be related to atelectasis or infiltrate. Bones are diffusely demineralized. IMPRESSION: Cardiomegaly with atelectasis or infiltrate  medial right lung base. Electronically Signed   By: Misty Stanley M.D.   On: 06/12/2016 19:13  Echocardiogram 10/29/2015  ------------------------------------------------------------------- LV EF: 55%  ------------------------------------------------------------------- Indications:      Mitral regurgitation (I34.0).  ------------------------------------------------------------------- History:   PMH:   Atrial fibrillation.  Mitral valve prolapse. Risk factors:  DVT. PNA. OSA. Respiratory arrest. Hypothyroidism. Seizure. Epilepsy. Brown&'s syndrome. Diarrhea.  ------------------------------------------------------------------- Study Conclusions  - Left ventricle: The cavity size was mildly dilated. Wall  thickness was normal. The estimated ejection fraction was 55%.   Doppler parameters are consistent with abnormal left ventricular   relaxation (grade 1 diastolic dysfunction). - Mitral valve: There was moderate regurgitation. - Left atrium: The atrium was mildly dilated. - Atrial septum: No defect or patent foramen ovale was identified.  ------------------------------------------------------------------- Labs, prior tests, procedures, and surgery: Transthoracic echocardiography (05/20/2013).     EF was 65%.  EKG: Independently reviewed.  Vent. rate 91 BPM PR interval * ms QRS duration 96 ms QT/QTc 361/445 ms P-R-T axes 38 51 -16 Sinus rhythm Tall R wave in V2, consider RVH or PMI Borderline T abnormalities, inferior leads  Assessment/Plan Principal Problem:   CAP (community acquired pneumonia) Admit to telemetry/inpatient. Continue supplemental oxygen. Continue the scheduled and as needed bronchodilators. Continue guaifenesin. Continue IV antibiotics. Follow-up blood cultures and sensitivity. Check a sputum Gram stain, C&S if possible. Check strep pneumonia urine antigen.  Active Problems:   Allergic rhinitis Continue Zyrtec or formulary equivalent.     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 Supportive care.    Generalized convulsive epilepsy ([place]) Continue carbamazepine, Keppra, ER topiramate, Vimpat. Lorazepam and phenobarbital as needed. Check carbamazepine level.    Obstructive sleep apnea Continue CPAP ventilation with home device.    Hypothyroidism Continue levothyroxine 25 g by mouth daily. Monitor TSH periodically.    Hypokalemia Continue potassium supplementation. Monitor potassium level.    Anemia Monitor hematocrit and hemoglobin.    Hyperlipidemia Continue Crestor 10 mg by mouth at bedtime. Monitor LFTs periodically.    DVT prophylaxis: Lovenox SQ. Code Status: Full code. Family Communication: The patient's parents were in the room. Disposition Plan: Admit for IV antibiotic therapy for several days Consults called:  Admission status: Inpatient/telemetry.   Reubin Milan MD [facility] Pager [phone].  If 7PM-7AM, please contact night-coverage [email or web address] Password [facility]  06/12/2016, 10:37 PM

## 2016-06-13 DIAGNOSIS — E876 Hypokalemia: Secondary | ICD-10-CM

## 2016-06-13 DIAGNOSIS — I959 Hypotension, unspecified: Secondary | ICD-10-CM

## 2016-06-13 DIAGNOSIS — G4733 Obstructive sleep apnea (adult) (pediatric): Secondary | ICD-10-CM

## 2016-06-13 DIAGNOSIS — J189 Pneumonia, unspecified organism: Principal | ICD-10-CM

## 2016-06-13 DIAGNOSIS — R6510 Systemic inflammatory response syndrome (SIRS) of non-infectious origin without acute organ dysfunction: Secondary | ICD-10-CM

## 2016-06-13 LAB — CBC WITH DIFFERENTIAL/PLATELET
BASOS ABS: 0 10*3/uL (ref 0.0–0.1)
BASOS PCT: 0 %
EOS ABS: 0.1 10*3/uL (ref 0.0–0.7)
EOS PCT: 1 %
HEMATOCRIT: 32.1 % — AB (ref 39.0–52.0)
Hemoglobin: 9.7 g/dL — ABNORMAL LOW (ref 13.0–17.0)
Lymphocytes Relative: 13 %
Lymphs Abs: 1.9 10*3/uL (ref 0.7–4.0)
MCH: 26.9 pg (ref 26.0–34.0)
MCHC: 30.2 g/dL (ref 30.0–36.0)
MCV: 88.9 fL (ref 78.0–100.0)
MONO ABS: 1.3 10*3/uL — AB (ref 0.1–1.0)
MONOS PCT: 9 %
NEUTROS ABS: 10.9 10*3/uL — AB (ref 1.7–7.7)
Neutrophils Relative %: 77 %
PLATELETS: 258 10*3/uL (ref 150–400)
RBC: 3.61 MIL/uL — ABNORMAL LOW (ref 4.22–5.81)
RDW: 13.6 % (ref 11.5–15.5)
WBC: 14.3 10*3/uL — ABNORMAL HIGH (ref 4.0–10.5)

## 2016-06-13 LAB — BASIC METABOLIC PANEL
ANION GAP: 7 (ref 5–15)
BUN: <5 mg/dL — ABNORMAL LOW (ref 6–20)
CALCIUM: 7.9 mg/dL — AB (ref 8.9–10.3)
CO2: 22 mmol/L (ref 22–32)
CREATININE: 0.75 mg/dL (ref 0.61–1.24)
Chloride: 106 mmol/L (ref 101–111)
Glucose, Bld: 102 mg/dL — ABNORMAL HIGH (ref 65–99)
Potassium: 3.4 mmol/L — ABNORMAL LOW (ref 3.5–5.1)
SODIUM: 135 mmol/L (ref 135–145)

## 2016-06-13 LAB — PHOSPHORUS: PHOSPHORUS: 3 mg/dL (ref 2.5–4.6)

## 2016-06-13 LAB — CARBAMAZEPINE LEVEL, TOTAL: CARBAMAZEPINE LVL: 11.3 ug/mL (ref 4.0–12.0)

## 2016-06-13 LAB — URINE CULTURE: Culture: <10000 — AB

## 2016-06-13 LAB — HIV ANTIBODY (ROUTINE TESTING W REFLEX): HIV SCREEN 4TH GENERATION: NONREACTIVE

## 2016-06-13 LAB — MAGNESIUM: Magnesium: 1.7 mg/dL (ref 1.7–2.4)

## 2016-06-13 MED ORDER — LACOSAMIDE 50 MG PO TABS: 50.0000 mg | ORAL_TABLET | Freq: Three times a day (TID) | ORAL | Status: DC

## 2016-06-13 MED ORDER — GUAIFENESIN 200 MG PO TABS
400.0000 mg | ORAL_TABLET | Freq: Two times a day (BID) | ORAL | Status: DC
  Administered 2016-06-13 – 2016-06-14 (×3): 400 mg via ORAL
  Filled 2016-06-13 (×4): qty 2

## 2016-06-13 MED ORDER — CLOBAZAM 10 MG PO TABS
10.0000 mg | ORAL_TABLET | Freq: Three times a day (TID) | ORAL | Status: DC
  Administered 2016-06-13 – 2016-06-14 (×3): 10 mg via ORAL
  Filled 2016-06-13 (×3): qty 1

## 2016-06-13 MED ORDER — LORATADINE 10 MG PO TABS
10.0000 mg | ORAL_TABLET | Freq: Every day | ORAL | Status: DC
  Administered 2016-06-13 – 2016-06-14 (×2): 10 mg via ORAL
  Filled 2016-06-13 (×2): qty 1

## 2016-06-13 MED ORDER — SODIUM CHLORIDE 0.9 % IV SOLN
INTRAVENOUS | Status: AC
  Administered 2016-06-13 – 2016-06-14 (×2): via INTRAVENOUS

## 2016-06-13 MED ORDER — INFLUENZA VAC SPLIT QUAD 0.5 ML IM SUSY
0.5000 mL | PREFILLED_SYRINGE | INTRAMUSCULAR | Status: AC
  Administered 2016-06-14: 0.5 mL via INTRAMUSCULAR
  Filled 2016-06-13: qty 0.5

## 2016-06-13 MED ORDER — ALBUTEROL SULFATE (2.5 MG/3ML) 0.083% IN NEBU: 2.5000 mg | INHALATION_SOLUTION | RESPIRATORY_TRACT | Status: DC | PRN

## 2016-06-13 MED ORDER — ACETAMINOPHEN 500 MG PO TABS: 1000.0000 mg | ORAL_TABLET | Freq: Four times a day (QID) | ORAL | Status: DC | PRN

## 2016-06-13 MED ORDER — CARBAMAZEPINE 100 MG PO CHEW
150.0000 mg | CHEWABLE_TABLET | Freq: Every day | ORAL | Status: DC
  Administered 2016-06-13: 150 mg via ORAL
  Filled 2016-06-13 (×2): qty 1.5

## 2016-06-13 MED ORDER — DEXTROSE 5 % IV SOLN
500.0000 mg | INTRAVENOUS | Status: DC
  Administered 2016-06-13: 500 mg via INTRAVENOUS
  Filled 2016-06-13 (×2): qty 500

## 2016-06-13 MED ORDER — LACOSAMIDE 50 MG PO TABS
50.0000 mg | ORAL_TABLET | Freq: Every day | ORAL | Status: DC
  Administered 2016-06-13 – 2016-06-14 (×2): 50 mg via ORAL
  Filled 2016-06-13 (×2): qty 1

## 2016-06-13 MED ORDER — ACETAMINOPHEN 325 MG PO TABS: 650.0000 mg | ORAL_TABLET | Freq: Four times a day (QID) | ORAL | Status: DC | PRN

## 2016-06-13 MED ORDER — TOPIRAMATE ER 50 MG PO CAP24
50.0000 mg | ORAL_CAPSULE | Freq: Every day | ORAL | Status: DC
  Administered 2016-06-13: 50 mg via ORAL
  Filled 2016-06-13: qty 1

## 2016-06-13 MED ORDER — K PHOS MONO-SOD PHOS DI & MONO 155-852-130 MG PO TABS
500.0000 mg | ORAL_TABLET | Freq: Three times a day (TID) | ORAL | Status: DC
  Administered 2016-06-13 – 2016-06-14 (×6): 500 mg via ORAL
  Filled 2016-06-13 (×6): qty 2

## 2016-06-13 MED ORDER — POTASSIUM PHOSPHATE MONOBASIC 500 MG PO TABS
500.0000 mg | ORAL_TABLET | Freq: Three times a day (TID) | ORAL | Status: DC
  Filled 2016-06-13 (×3): qty 1

## 2016-06-13 MED ORDER — ROSUVASTATIN CALCIUM 10 MG PO TABS
10.0000 mg | ORAL_TABLET | Freq: Every day | ORAL | Status: DC
  Administered 2016-06-13: 10 mg via ORAL
  Filled 2016-06-13: qty 1

## 2016-06-13 MED ORDER — DEXTROSE 5 % IV SOLN
1.0000 g | INTRAVENOUS | Status: DC
  Administered 2016-06-13: 1 g via INTRAVENOUS
  Filled 2016-06-13 (×2): qty 10

## 2016-06-13 MED ORDER — POTASSIUM CHLORIDE CRYS ER 20 MEQ PO TBCR
20.0000 meq | EXTENDED_RELEASE_TABLET | Freq: Four times a day (QID) | ORAL | Status: DC
  Administered 2016-06-13 – 2016-06-14 (×5): 20 meq via ORAL
  Filled 2016-06-13 (×5): qty 1

## 2016-06-13 MED ORDER — IPRATROPIUM-ALBUTEROL 0.5-2.5 (3) MG/3ML IN SOLN: 3.0000 mL | Freq: Four times a day (QID) | RESPIRATORY_TRACT | Status: DC | PRN

## 2016-06-13 MED ORDER — LACOSAMIDE 50 MG PO TABS
100.0000 mg | ORAL_TABLET | Freq: Two times a day (BID) | ORAL | Status: DC
  Administered 2016-06-13 – 2016-06-14 (×3): 100 mg via ORAL
  Filled 2016-06-13 (×3): qty 2

## 2016-06-13 MED ORDER — MONTELUKAST SODIUM 10 MG PO TABS
10.0000 mg | ORAL_TABLET | Freq: Every day | ORAL | Status: DC
  Administered 2016-06-13: 10 mg via ORAL
  Filled 2016-06-13: qty 1

## 2016-06-13 MED ORDER — LORAZEPAM 2 MG/ML IJ SOLN: 2.0000 mg | INTRAMUSCULAR | Status: DC | PRN

## 2016-06-13 MED ORDER — TOPIRAMATE ER 200 MG PO CAP24
200.0000 mg | ORAL_CAPSULE | Freq: Every day | ORAL | Status: DC
  Administered 2016-06-13: 200 mg via ORAL
  Filled 2016-06-13: qty 1

## 2016-06-13 MED ORDER — SODIUM CHLORIDE 0.9% FLUSH
3.0000 mL | Freq: Two times a day (BID) | INTRAVENOUS | Status: DC
Start: 2016-06-13 — End: 2016-06-14
  Administered 2016-06-13 (×3): 3 mL via INTRAVENOUS

## 2016-06-13 MED ORDER — ADULT MULTIVITAMIN W/MINERALS CH
1.0000 | ORAL_TABLET | Freq: Every day | ORAL | Status: DC
  Administered 2016-06-13 – 2016-06-14 (×2): 1 via ORAL
  Filled 2016-06-13 (×2): qty 1

## 2016-06-13 MED ORDER — IPRATROPIUM-ALBUTEROL 0.5-2.5 (3) MG/3ML IN SOLN
3.0000 mL | Freq: Four times a day (QID) | RESPIRATORY_TRACT | Status: DC
  Administered 2016-06-13 (×3): 3 mL via RESPIRATORY_TRACT
  Filled 2016-06-13 (×5): qty 3

## 2016-06-13 MED ORDER — CARBAMAZEPINE 100 MG PO CHEW
250.0000 mg | CHEWABLE_TABLET | Freq: Two times a day (BID) | ORAL | Status: DC
  Administered 2016-06-13 – 2016-06-14 (×3): 250 mg via ORAL
  Filled 2016-06-13 (×4): qty 2.5

## 2016-06-13 MED ORDER — LEVETIRACETAM 500 MG PO TABS
1500.0000 mg | ORAL_TABLET | Freq: Two times a day (BID) | ORAL | Status: DC
  Administered 2016-06-13 – 2016-06-14 (×3): 1500 mg via ORAL
  Filled 2016-06-13 (×3): qty 3

## 2016-06-13 MED ORDER — LEVOTHYROXINE SODIUM 25 MCG PO TABS
25.0000 ug | ORAL_TABLET | Freq: Every day | ORAL | Status: DC
  Administered 2016-06-13 – 2016-06-14 (×2): 25 ug via ORAL
  Filled 2016-06-13 (×2): qty 1

## 2016-06-13 MED ORDER — CLOBAZAM 10 MG PO TABS
10.0000 mg | ORAL_TABLET | Freq: Every day | ORAL | Status: DC
  Administered 2016-06-13: 10 mg via ORAL
  Filled 2016-06-13: qty 1

## 2016-06-13 MED ORDER — CARBAMAZEPINE 100 MG PO CHEW: 150.0000 mg | CHEWABLE_TABLET | Freq: Three times a day (TID) | ORAL | Status: DC

## 2016-06-13 MED ORDER — ENOXAPARIN SODIUM 40 MG/0.4ML ~~LOC~~ SOLN
40.0000 mg | Freq: Every day | SUBCUTANEOUS | Status: DC
  Administered 2016-06-13 (×2): 40 mg via SUBCUTANEOUS
  Filled 2016-06-13 (×2): qty 0.4

## 2016-06-13 NOTE — Progress Notes (Addendum)
PROGRESS NOTE                                                                                                                                                                                                             Patient Demographics:    [name], is a 30 y.o. male, DOB - 1986/08/07, [id number]  Admit date - 06/12/2016   Admitting Physician Reubin Milan, MD  Outpatient Primary MD for the patient is Chesley Noon, MD  LOS - 1  Outpatient Specialists: none  Chief Complaint  Patient presents with  . Pneumonia       Brief Narrative   67 male with history of generalized convulsive epilepsy mild rhinitis, Brown syndrome, growth disorder, mitral regurgitation, sleep apnea on CPAP, hyperlipidemia and history of DVT brought by his pains with symptoms suggestive of pneumonia. Patient recently returned from a week trip to [place] after visiting his ailing grandmother. While in [place] patient was diagnosed with pneumonia and discharged home on Augmentin. This was about a week prior to admission. However patient did not feel better and had subjective fever, productive cough with clear sputum, dyspnea on exertion and poor appetite.   Subjective:    In the ED he had soft blood pressure and was found to be wheezy. He received 3 rounds of nebulizer. Chest x-ray showed infiltrate of the medial right lung base. Patient admitted for community-acquired pneumonia.    Assessment  & Plan :    Principal Problem:   SIRS with CAP (community acquired pneumonia) Patient has increased leukocytes and mildly hypotensive today.  on empiric Rocephin and azithromycin. Follow blood cultures, sputum culture and strep pneumonia antigen. Supportive care with Tylenol and antitussives. When necessary nebs.  Active Problems: Hypotension Place on IV fluids. Monitor on telemetry.    Generalized convulsive epilepsy  ([place]) Continue Keppra, carbamazepine, topiramate and Vimpat. On Ativan as needed.    Obstructive sleep apnea Continue bedtime CPAP    Hypothyroidism Continue Synthroid.    Hypokalemia Replenished        Code Status : Full code  Family Communication  : Mother at bedside  Disposition Plan  : Home once improved possibly in the next 24-48 hours  Barriers For Discharge : Active symptoms  Consults  : None  Procedures  : None  DVT Prophylaxis  :  Lovenox -  Lab Results  Component Value Date   PLT 258 06/13/2016    Antibiotics  :   Anti-infectives    Start     Dose/Rate Route Frequency Ordered Stop   06/13/16 2100  cefTRIAXone (ROCEPHIN) 1 g in dextrose 5 % 50 mL IVPB     1 g 100 mL/hr over 30 Minutes Intravenous Every 24 hours 06/13/16 0052 06/20/16 2059   06/13/16 2100  azithromycin (ZITHROMAX) 500 mg in dextrose 5 % 250 mL IVPB     500 mg 250 mL/hr over 60 Minutes Intravenous Every 24 hours 06/13/16 0052 06/20/16 2059   06/12/16 1945  cefTRIAXone (ROCEPHIN) 1 g in dextrose 5 % 50 mL IVPB     1 g 100 mL/hr over 30 Minutes Intravenous  Once 06/12/16 1940 06/12/16 2057   06/12/16 1945  azithromycin (ZITHROMAX) 500 mg in dextrose 5 % 250 mL IVPB     500 mg 250 mL/hr over 60 Minutes Intravenous  Once 06/12/16 1940 06/12/16 2057        Objective:   Vitals:   06/13/16 0030 06/13/16 0100 06/13/16 0505 06/13/16 1023  BP: 94/62 118/61 (!) 90/50 (!) 96/49  Pulse: 95 (!) 108 91 100  Resp: 19 20 19    Temp:  98.5 F (36.9 C) 98.4 F (36.9 C) 99.5 F (37.5 C)  TempSrc:  Oral Oral Oral  SpO2: 98% 99% 95% 93%  Weight:  58.1 kg (128 lb 1.4 oz)    Height:  5\' 3"  (1.6 m)      Wt Readings from Last 3 Encounters:  06/13/16 58.1 kg (128 lb 1.4 oz)  01/07/16 52.3 kg (115 lb 3.2 oz)  11/22/15 50.8 kg (112 lb)     Intake/Output Summary (Last 24 hours) at 06/13/16 1310 Last data filed at 06/13/16 0610  Gross per 24 hour  Intake              800 ml  Output               500 ml  Net              300 ml     Physical Exam  [id number] male not in distress HEENT: no pallor, moist mucosa, supple neck Chest: Coarse breath sounds bilaterally, scattered rhonchi CVS: N S1&S2, no murmurs, rubs or gallop GI: soft, NT, ND, BS+ Musculoskeletal: warm, no edema CNS: Alert and oriented, difficult to comprehend, nonfocal    Data Review:    CBC  Recent Labs Lab 06/12/16 1735 06/13/16 0550  WBC 12.0* 14.3*  HGB 10.1* 9.7*  HCT 32.1* 32.1*  PLT 231 258  MCV 88.2 88.9  MCH 27.7 26.9  MCHC 31.5 30.2  RDW 13.4 13.6  LYMPHSABS 1.9 1.9  MONOABS 1.3* 1.3*  EOSABS 0.0 0.1  BASOSABS 0.0 0.0    Chemistries   Recent Labs Lab 06/12/16 1735 06/13/16 0550  NA 136 135  K 3.3* 3.4*  CL 112* 106  CO2 18* 22  GLUCOSE 103* 102*  BUN 5* <5*  CREATININE 0.84 0.75  CALCIUM 7.2* 7.9*  MG  --  1.7  AST 16  --   ALT 16*  --   ALKPHOS 71  --   BILITOT 0.7  --    ------------------------------------------------------------------------------------------------------------------ No results for input(s): CHOL, HDL, LDLCALC, TRIG, CHOLHDL, LDLDIRECT in the last 72 hours.  No results found for: HGBA1C ------------------------------------------------------------------------------------------------------------------ No results for input(s): TSH, T4TOTAL, T3FREE, THYROIDAB in the last 72 hours.  Invalid input(s): FREET3 ------------------------------------------------------------------------------------------------------------------  No results for input(s): VITAMINB12, FOLATE, FERRITIN, TIBC, IRON, RETICCTPCT in the last 72 hours.  Coagulation profile No results for input(s): INR, PROTIME in the last 168 hours.  No results for input(s): DDIMER in the last 72 hours.  Cardiac Enzymes No results for input(s): CKMB, TROPONINI, MYOGLOBIN in the last 168 hours.  Invalid input(s):  CK ------------------------------------------------------------------------------------------------------------------ No results found for: BNP  Inpatient Medications  Scheduled Meds: . azithromycin  500 mg Intravenous Q24H  . carbamazepine  150 mg Oral QHS  . carbamazepine  250 mg Oral BID  . cefTRIAXone (ROCEPHIN)  IV  1 g Intravenous Q24H  . cloBAZam  10 mg Oral Daily  . enoxaparin (LOVENOX) injection  40 mg Subcutaneous QHS  . guaiFENesin  400 mg Oral BID  . [START ON 06/14/2016] Influenza vac split quadrivalent PF  0.5 mL Intramuscular Tomorrow-1000  . ipratropium-albuterol  3 mL Nebulization Q6H  . lacosamide  100 mg Oral BID  . lacosamide  50 mg Oral Q lunch  . levETIRAcetam  1,500 mg Oral BID  . levothyroxine  25 mcg Oral QAC breakfast  . loratadine  10 mg Oral Daily  . montelukast  10 mg Oral QHS  . multivitamin with minerals  1 tablet Oral Daily  . phosphorus  500 mg Oral TID WC & HS  . potassium chloride SA  20 mEq Oral QID  . rosuvastatin  10 mg Oral QHS  . sodium chloride flush  3 mL Intravenous Q12H  . Topiramate ER  1 capsule Oral QHS  . Topiramate ER  50 mg Oral QHS   Continuous Infusions: . sodium chloride     PRN Meds:.acetaminophen, albuterol, LORazepam  Micro Results No results found for this or any previous visit (from the past 240 hour(s)).  Radiology Reports Dg Chest 2 View  Result Date: 06/12/2016 CLINICAL DATA:  Bronchitis. EXAM: CHEST  2 VIEW COMPARISON:  02/01/2016 FINDINGS: 1822 hours. The cardio pericardial silhouette is enlarged. Battery pack for cervical stimulator device identified over the left hemi thorax. Right [name] tip overlies the distal SVC level near the junction with the RA. Right infrahilar opacity may be related to atelectasis or infiltrate. Bones are diffusely demineralized. IMPRESSION: Cardiomegaly with atelectasis or infiltrate medial right lung base. Electronically Signed   By: Misty Stanley M.D.   On: 06/12/2016 19:13     Time Spent in minutes  25   Louellen Molder M.D on 06/13/2016 at 1:10 PM  Between 7am to 7pm - Pager - [phone]  After 7pm go to [email or web address] - password [facility]  [facility] -  Office  [phone]

## 2016-06-13 NOTE — Progress Notes (Signed)
New Admission Note:  Arrival Method: Stretcher from ED Mental Orientation: A&O x2 Telemetry: box 15, CCMD notified Assessment: Completed Skin: Assessed with Percell Boston, RN, check flowsheets IV: L wrist, R Forearm, Saline locked Pain: 0/10 Tubes: N/A Safety Measures: Safety Fall Prevention Plan discussed with patient and family. Admission: Completed [address] Orientation: Patient has been orientated to the room, unit and the staff. Family: Mom at the bedside.  Orders have been reviewed and implemented. Will continue to monitor the patient. Call light has been placed within reach and bed alarm has been activated.   Nena Polio BSN, RN  Phone Number: [phone]

## 2016-06-14 LAB — BASIC METABOLIC PANEL
ANION GAP: 5 (ref 5–15)
BUN: <5 mg/dL — ABNORMAL LOW (ref 6–20)
CALCIUM: 7.8 mg/dL — AB (ref 8.9–10.3)
CO2: 20 mmol/L — ABNORMAL LOW (ref 22–32)
CREATININE: 0.68 mg/dL (ref 0.61–1.24)
Chloride: 113 mmol/L — ABNORMAL HIGH (ref 101–111)
Glucose, Bld: 94 mg/dL (ref 65–99)
Potassium: 3.4 mmol/L — ABNORMAL LOW (ref 3.5–5.1)
SODIUM: 138 mmol/L (ref 135–145)

## 2016-06-14 LAB — CBC WITH DIFFERENTIAL/PLATELET
BASOS ABS: 0 10*3/uL (ref 0.0–0.1)
BASOS PCT: 0 %
EOS ABS: 0.1 10*3/uL (ref 0.0–0.7)
Eosinophils Relative: 2 %
HEMATOCRIT: 30.2 % — AB (ref 39.0–52.0)
HEMOGLOBIN: 9.3 g/dL — AB (ref 13.0–17.0)
Lymphocytes Relative: 31 %
Lymphs Abs: 2 10*3/uL (ref 0.7–4.0)
MCH: 27.2 pg (ref 26.0–34.0)
MCHC: 30.8 g/dL (ref 30.0–36.0)
MCV: 88.3 fL (ref 78.0–100.0)
MONO ABS: 0.7 10*3/uL (ref 0.1–1.0)
MONOS PCT: 11 %
NEUTROS ABS: 3.7 10*3/uL (ref 1.7–7.7)
NEUTROS PCT: 56 %
Platelets: 245 10*3/uL (ref 150–400)
RBC: 3.42 MIL/uL — ABNORMAL LOW (ref 4.22–5.81)
RDW: 13.6 % (ref 11.5–15.5)
WBC: 6.6 10*3/uL (ref 4.0–10.5)

## 2016-06-14 LAB — STREP PNEUMONIAE URINARY ANTIGEN: STREP PNEUMO URINARY ANTIGEN: NEGATIVE

## 2016-06-14 MED ORDER — POTASSIUM CHLORIDE CRYS ER 20 MEQ PO TBCR: 40.0000 meq | EXTENDED_RELEASE_TABLET | Freq: Once | ORAL | Status: DC

## 2016-06-14 MED ORDER — IPRATROPIUM-ALBUTEROL 0.5-2.5 (3) MG/3ML IN SOLN
3.0000 mL | Freq: Three times a day (TID) | RESPIRATORY_TRACT | Status: DC
  Administered 2016-06-14: 3 mL via RESPIRATORY_TRACT
  Filled 2016-06-14: qty 3

## 2016-06-14 MED ORDER — ONDANSETRON HCL 4 MG/2ML IJ SOLN
4.0000 mg | Freq: Once | INTRAMUSCULAR | Status: AC
  Administered 2016-06-14: 4 mg via INTRAVENOUS
  Filled 2016-06-14: qty 2

## 2016-06-14 MED ORDER — DOXYCYCLINE HYCLATE 100 MG PO TABS: 100.0000 mg | ORAL_TABLET | Freq: Two times a day (BID) | ORAL | 0 refills | Status: AC

## 2016-06-14 NOTE — Evaluation (Signed)
Clinical/Bedside Swallow Evaluation Patient Details  Name: [name] MRN: [id number] Date of Birth: 1985-12-28  Today's Date: 06/14/2016 Time: SLP Start Time (ACUTE ONLY): 1045 SLP Stop Time (ACUTE ONLY): 1110 SLP Time Calculation (min) (ACUTE ONLY): 25 min  Past Medical History:  Past Medical History:  Diagnosis Date  . ALLERGIC RHINITIS 12/26/2009  . Blood clot in vein   . Brown's syndrome   . Growth disorder   . Heart murmur    moderate MR  . Hyperlipidemia    No therapy  . Hypokalemia   . Mental retardation, mild (I.Q. 50-70)   . MITRAL VALVE PROLAPSE 06/22/2007  . SEIZURE DISORDER 06/22/2007  . Sleep apnea    cpap , last sleep study 10/01/11  . Unspecified hypothyroidism 06/22/2007   Past Surgical History:  Past Surgical History:  Procedure Laterality Date  . EYE SURGERY     X2 for Brown Syndrome  . IMPLANTATION VAGAL NERVE STIMULATOR     Left  . PORTACATH PLACEMENT     and removal 04/08/12   HPI:  Patient is a 30 y.o. male with PMH of Brown's syndrome, growth disorder, MVP, sleep apnea, generalized convulsive epilepsy, allergic rhinitis who was admitted to ER for evaluation of pneumonia.   Assessment / Plan / Recommendation Clinical Impression  Patient presents with a mild oropharyngeal dysphagia, characterized by swallow initiation delays with all tested consistencies. Parents reported that patient has recent h/o coughing when eating and they suspect that it is related to patient's impulsivity when eating as well as his allergies and related congestion and dry throat. Patient did exhibit delayed coughing after consuming regular solid texture PO's but clinician suspects that this is not aspiration or penetration, due to dry sounding, non-productive cough with no changes in vocal quality. As patient is discharging home with parents today, clinician recommended that that continue to monitor him with PO intake at home and if coughing does not start to subside or  resolve in the next 2-weeks, request outpatient MBS from patient's PCP.    Aspiration Risk  Mild aspiration risk    Diet Recommendation     Liquid Administration via: Straw Medication Administration: Crushed with puree Supervision: Patient able to self feed;Staff to assist with self feeding Postural Changes: Seated upright at 90 degrees    Other  Recommendations Oral Care Recommendations: Oral care BID   Follow up Recommendations Other (comment)      Frequency and Duration            Prognosis        Swallow Study   General Date of Onset: 06/12/16 HPI: Patient is a 30 y.o. male with PMH of Brown's syndrome, growth disorder, MVP, sleep apnea, generalized convulsive epilepsy, allergic rhinitis who was admitted to ER for evaluation of pneumonia. Type of Study: Bedside Swallow Evaluation Previous Swallow Assessment: N/A Diet Prior to this Study: Regular;Thin liquids Temperature Spikes Noted: No Respiratory Status: Room air History of Recent Intubation: No Behavior/Cognition: Alert;Cooperative;Requires cueing Oral Cavity Assessment: Within Functional Limits Oral Care Completed by SLP: No Oral Cavity - Dentition: Adequate natural dentition Self-Feeding Abilities: Able to feed self;Needs set up Patient Positioning: Partially reclined Baseline Vocal Quality: Hoarse;Low vocal intensity Volitional Cough: Strong Volitional Swallow: Unable to elicit    Oral/Motor/Sensory Function Overall Oral Motor/Sensory Function: Within functional limits   Ice Chips     Thin Liquid Thin Liquid: Impaired Pharyngeal  Phase Impairments: Suspected delayed Swallow    Nectar Thick     Honey Thick  Puree Puree: Within functional limits Presentation: Self Fed;Spoon   Solid   GO   Solid: Impaired Other Comments: dealyed cough, but vocal quality remained clear and suspect that cough related to combination of respiratory, allergy/congestion as opposed to solids but cannot r/o at bedside         Sonia Baller, MA, CCC-SLP 06/14/16 1:22 PM

## 2016-06-14 NOTE — Plan of Care (Signed)
Problem: Education: Goal: Knowledge of [facility] Education information/materials will improve Outcome: Progressing POC reviewed with pt. and mother.

## 2016-06-14 NOTE — Discharge Summary (Addendum)
Physician Discharge Summary  [name] [id number] DOB: 08-16-1986 DOA: 06/12/2016  PCP: Chesley Noon, MD  Admit date: 06/12/2016 Discharge date: 06/14/2016  Admitted From: home Disposition:  home  Recommendations for Outpatient Follow-up:  1. Follow up with PCP in 1-2 weeks 2. Completes antibiotics course on 06/19/2016  Home Health:none Equipment/Devices: none   Discharge Condition:fair CODE STATUS: full code Diet recommendation: regular     Discharge Diagnoses:  Principal Problem:   CAP (community acquired pneumonia)   Active Problems:   SIRS   Allergic rhinitis   Brown's syndrome   Generalized convulsive epilepsy ([place])   Obstructive sleep apnea   Hypothyroidism   Hypokalemia   Anemia  Brief narrative 48 male with history of generalized convulsive epilepsy mild rhinitis, Brown syndrome, growth disorder, mitral regurgitation, sleep apnea on CPAP, hyperlipidemia and history of DVT brought by his pains with symptoms suggestive of pneumonia. Patient recently returned from a week trip to [place] after visiting his ailing grandmother. While in [place] patient was diagnosed with pneumonia and discharged home on Augmentin. This was about a week prior to admission. However patient did not feel better and had subjective fever, productive cough with clear sputum, dyspnea on exertion and poor appetite.  Hospital course Principal Problem:   SIRS with CAP (community acquired pneumonia)   on empiric Rocephin and azithromycin. Cultures, urine for strep ag negative. Supportive care with nebs, Tylenol and antitussives. Symptoms much improved. Will discharge on oral doxycycline to complete a 7 day course of abx.   Active Problems:  Mild aspiration risk  pt reported coughs after swallowing. SLP evaluated and given mild aspiration risk recommended feeding in 90 degree upright position. Crush meds with puree.  Hypotension Improved with IV fluids    Generalized convulsive epilepsy (H[facility]) Continue Keppra, carbamazepine, topiramate and Vimpat. On Ativan as needed.    Obstructive sleep apnea Continue bedtime CPAP    Hypothyroidism Continue Synthroid.    Hypokalemia Replenished        Code Status : Full code  Family Communication  : Mother at bedside  Disposition Plan  : Home   Consults  : None  Procedures  : None  Discharge Instructions     Medication List    STOP taking these medications   amoxicillin-clavulanate 875-125 MG tablet Commonly known as:  AUGMENTIN     TAKE these medications   acetaminophen 500 MG tablet Commonly known as:  TYLENOL Take 1,000 mg by mouth every 6 (six) hours as needed for headache.   albuterol 1.25 MG/3ML nebulizer solution Commonly known as:  ACCUNEB Take 3 mLs by nebulization every 6 (six) hours as needed for shortness of breath.   carbamazepine 100 MG chewable tablet Commonly known as:  TEGRETOL Take 2-1/2 tablets 3 times daily What changed:  how much to take  how to take this  when to take this  additional instructions   cetirizine 10 MG tablet Commonly known as:  ZYRTEC Take 10 mg by mouth daily.   diazepam 2 MG tablet Commonly known as:  VALIUM Take 1 tablet every 6 - 8 hours as needed for anxiety or seizures   doxycycline 100 MG tablet Commonly known as:  VIBRA-TABS Take 1 tablet (100 mg total) by mouth 2 (two) times daily.   guaifenesin 400 MG Tabs tablet Commonly known as:  HUMIBID E Take 400 mg by mouth 2 (two) times daily.   K-PHOS 500 MG tablet Generic drug:  potassium phosphate (monobasic) TAKE 1 TABLET (500 MG TOTAL)  BY MOUTH 4 (FOUR) TIMES DAILY.   KEPPRA 500 MG tablet Generic drug:  levETIRAcetam TAKE 3 TABLETS BY MOUTH TWICE DAILY.   KLOR-CON M20 20 MEQ tablet Generic drug:  potassium chloride SA TAKE 1 TABLET BY MOUTH 4 TIMES A DAY   lacosamide 50 MG Tabs tablet Commonly known as:  VIMPAT Take 2 in the AM, 1 at lunch,  then 2 at night What changed:  how much to take  how to take this  when to take this  additional instructions   levothyroxine 25 MCG tablet Commonly known as:  SYNTHROID, LEVOTHROID Take 25 mcg by mouth daily before breakfast.   LORazepam 2 MG/ML concentrated solution Commonly known as:  ATIVAN Withdraw 110ml (2mg ) and 10ml  Normal saline into 3 ml syringe. Administer IV push over 3-5 minutes as needed for recurrent seizures   montelukast 10 MG tablet Commonly known as:  SINGULAIR Take 10 mg by mouth at bedtime.   multivitamin with minerals Tabs tablet Take 1 tablet by mouth daily.   ondansetron 4 MG disintegrating tablet Commonly known as:  ZOFRAN-ODT Take 1 tablet as needed for nausea and vomiting   [name] 10 MG tablet Generic drug:  cloBAZam Take 10 mg by mouth 3 (three) times daily. TAKES 1 TAB 3 TIMES DAILY   PHENObarbital 65 MG/ML injection Commonly known as:  LUMINAL Withdraw 89ml (65mg ) Phenobarbital and 6ml Normal Saline into 46ml syringe. Adminster IV push for 3-5 minutes as needed for recurrent seizures   rosuvastatin 10 MG tablet Commonly known as:  CRESTOR Take 10 mg by mouth at bedtime.   Topiramate ER 200 MG Cp24 Commonly known as:  TROKENDI XR Take 1 capsule by mouth at bedtime. What changed:  Another medication with the same name was changed. Make sure you understand how and when to take each.   TROKENDI XR 50 MG Cp24 Generic drug:  Topiramate ER Take 1 at night What changed:  how much to take  how to take this  when to take this  additional instructions       Allergies  Allergen Reactions  . Antihistamines, Chlorpheniramine-Type Other (See Comments)    Other Reaction: Dimetapp causes seizures  . Divalproex Sodium Other (See Comments)    Nausea, vomiting, liver and kidney dysfunction  . Phenytoin Rash and Other (See Comments)    Dilantin causes more seizures.   . Valproic Acid And Related Other (See Comments)    Shuts down systems  .  Vancomycin Rash, Other (See Comments) and Swelling    If given too fast, causes red man reaction Other Reaction: Redman's Syndrome        Procedures/Studies: Dg Chest 2 View  Result Date: 06/12/2016 CLINICAL DATA:  Bronchitis. EXAM: CHEST  2 VIEW COMPARISON:  02/01/2016 FINDINGS: 1822 hours. The cardio pericardial silhouette is enlarged. Battery pack for cervical stimulator device identified over the left hemi thorax. Right [name] tip overlies the distal SVC level near the junction with the RA. Right infrahilar opacity may be related to atelectasis or infiltrate. Bones are diffusely demineralized. IMPRESSION: Cardiomegaly with atelectasis or infiltrate medial right lung base. Electronically Signed   By: Misty Stanley M.D.   On: 06/12/2016 19:13       Subjective: Symptoms better today.  Discharge Exam: Vitals:   06/14/16 0449 06/14/16 0801  BP: 92/60 (!) 93/58  Pulse: 87 81  Resp: 18 16  Temp: 97.7 F (36.5 C) 98 F (36.7 C)   Vitals:   06/13/16 2216 06/14/16 0449  06/14/16 0801 06/14/16 0858  BP:  92/60 (!) 93/58   Pulse:  87 81   Resp:  18 16   Temp:  97.7 F (36.5 C) 98 F (36.7 C)   TempSrc:  Oral Oral   SpO2: 99% 98% 96% 95%  Weight:      Height:        [id number] male not in distress HEENT: no pallor, moist mucosa, supple neck Chest: improved  breath sounds bilaterally, scattered rhonchi CVS: N S1&S2, no murmurs, rubs or gallop GI: soft, NT, ND, BS+ Musculoskeletal: warm, no edema CNS: Alert and oriented, difficult to comprehend, nonfocal    The results of significant diagnostics from this hospitalization (including imaging, microbiology, ancillary and laboratory) are listed below for reference.     Microbiology: Recent Results (from the past 240 hour(s))  Blood Culture (routine x 2)     Status: None (Preliminary result)   Collection Time: 06/12/16  6:51 PM  Result Value Ref Range Status   Specimen Description BLOOD RIGHT ANTECUBITAL  Final    Special Requests BOTTLES DRAWN AEROBIC AND ANAEROBIC 5 CC  Final   Culture NO GROWTH < 24 HOURS  Final   Report Status PENDING  Incomplete  Blood Culture (routine x 2)     Status: None (Preliminary result)   Collection Time: 06/12/16  6:57 PM  Result Value Ref Range Status   Specimen Description BLOOD RIGHT HAND  Final   Special Requests BOTTLES DRAWN AEROBIC AND ANAEROBIC 5 CC  Final   Culture NO GROWTH < 24 HOURS  Final   Report Status PENDING  Incomplete  Urine culture     Status: Abnormal   Collection Time: 06/12/16  7:30 PM  Result Value Ref Range Status   Specimen Description URINE, CLEAN CATCH  Final   Special Requests NONE  Final   Culture <10,000 COLONIES/mL INSIGNIFICANT GROWTH (A)  Final   Report Status 06/13/2016 FINAL  Final     Labs: BNP (last 3 results) No results for input(s): BNP in the last 8760 hours. Basic Metabolic Panel:  Recent Labs Lab 06/12/16 1735 06/13/16 0550 06/14/16 0515  NA 136 135 138  K 3.3* 3.4* 3.4*  CL 112* 106 113*  CO2 18* 22 20*  GLUCOSE 103* 102* 94  BUN 5* <5* <5*  CREATININE 0.84 0.75 0.68  CALCIUM 7.2* 7.9* 7.8*  MG  --  1.7  --   PHOS  --  3.0  --    Liver Function Tests:  Recent Labs Lab 06/12/16 1735  AST 16  ALT 16*  ALKPHOS 71  BILITOT 0.7  PROT 5.5*  ALBUMIN 2.3*   No results for input(s): LIPASE, AMYLASE in the last 168 hours. No results for input(s): AMMONIA in the last 168 hours. CBC:  Recent Labs Lab 06/12/16 1735 06/13/16 0550 06/14/16 0515  WBC 12.0* 14.3* 6.6  NEUTROABS 8.7* 10.9* 3.7  HGB 10.1* 9.7* 9.3*  HCT 32.1* 32.1* 30.2*  MCV 88.2 88.9 88.3  PLT 231 258 245   Cardiac Enzymes: No results for input(s): CKTOTAL, CKMB, CKMBINDEX, TROPONINI in the last 168 hours. BNP: Invalid input(s): POCBNP CBG: No results for input(s): GLUCAP in the last 168 hours. D-Dimer No results for input(s): DDIMER in the last 72 hours. Hgb A1c No results for input(s): HGBA1C in the last 72 hours. Lipid  Profile No results for input(s): CHOL, HDL, LDLCALC, TRIG, CHOLHDL, LDLDIRECT in the last 72 hours. Thyroid function studies No results for input(s): TSH, T4TOTAL, T3FREE, THYROIDAB  in the last 72 hours.  Invalid input(s): FREET3 Anemia work up No results for input(s): VITAMINB12, FOLATE, FERRITIN, TIBC, IRON, RETICCTPCT in the last 72 hours. Urinalysis    Component Value Date/Time   COLORURINE YELLOW 06/12/2016 1930   APPEARANCEUR CLEAR 06/12/2016 1930   LABSPEC 1.011 06/12/2016 1930   PHURINE 7.0 06/12/2016 1930   GLUCOSEU NEGATIVE 06/12/2016 1930   HGBUR NEGATIVE 06/12/2016 1930   BILIRUBINUR NEGATIVE 06/12/2016 1930   KETONESUR NEGATIVE 06/12/2016 1930   PROTEINUR NEGATIVE 06/12/2016 1930   UROBILINOGEN 0.2 04/21/2014 2218   NITRITE NEGATIVE 06/12/2016 1930   LEUKOCYTESUR NEGATIVE 06/12/2016 1930   Sepsis Labs Invalid input(s): PROCALCITONIN,  WBC,  LACTICIDVEN Microbiology Recent Results (from the past 240 hour(s))  Blood Culture (routine x 2)     Status: None (Preliminary result)   Collection Time: 06/12/16  6:51 PM  Result Value Ref Range Status   Specimen Description BLOOD RIGHT ANTECUBITAL  Final   Special Requests BOTTLES DRAWN AEROBIC AND ANAEROBIC 5 CC  Final   Culture NO GROWTH < 24 HOURS  Final   Report Status PENDING  Incomplete  Blood Culture (routine x 2)     Status: None (Preliminary result)   Collection Time: 06/12/16  6:57 PM  Result Value Ref Range Status   Specimen Description BLOOD RIGHT HAND  Final   Special Requests BOTTLES DRAWN AEROBIC AND ANAEROBIC 5 CC  Final   Culture NO GROWTH < 24 HOURS  Final   Report Status PENDING  Incomplete  Urine culture     Status: Abnormal   Collection Time: 06/12/16  7:30 PM  Result Value Ref Range Status   Specimen Description URINE, CLEAN CATCH  Final   Special Requests NONE  Final   Culture <10,000 COLONIES/mL INSIGNIFICANT GROWTH (A)  Final   Report Status 06/13/2016 FINAL  Final     Time coordinating  discharge: >30 minutes  SIGNED:   Louellen Molder, MD  [facility] 06/14/2016, 1:24 PM Pager   If 7PM-7AM, please contact night-coverage [email or web address] Password [email or web address]

## 2016-06-14 NOTE — Progress Notes (Signed)
Reviewed discharge instructions and medications with patient and patient's mother; all questions answered. Hard prescription given to patient's mother.  Home prescriptions picked up from pharmacy and given to patient's mother.

## 2016-06-14 NOTE — Progress Notes (Signed)
Patient experienced nausea/vomiting x1. Obtained one time order from MD for IV Zofran.  Holding 10am medications until nausea has subsided.

## 2016-06-17 LAB — CULTURE, BLOOD (ROUTINE X 2)
Culture: NO GROWTH
Culture: NO GROWTH

## 2016-07-04 ENCOUNTER — Other Ambulatory Visit: Payer: Self-pay | Admitting: Family

## 2016-07-04 ENCOUNTER — Telehealth (INDEPENDENT_AMBULATORY_CARE_PROVIDER_SITE_OTHER): Payer: Self-pay | Admitting: Pediatrics

## 2016-07-04 NOTE — Telephone Encounter (Signed)
Appt made for 08/11/16 at 3:00pm

## 2016-07-04 NOTE — Telephone Encounter (Signed)
-----   Message from Rockwell Germany, NP sent at 07/04/2016  9:49 AM EDT ----- Regarding: Needs appointment [name] needs an appointment with Dr Gaynell Face.  Thanks,  Otila Kluver

## 2016-07-28 ENCOUNTER — Other Ambulatory Visit (INDEPENDENT_AMBULATORY_CARE_PROVIDER_SITE_OTHER): Payer: Self-pay | Admitting: Pediatrics

## 2016-07-28 DIAGNOSIS — G40119 Localization-related (focal) (partial) symptomatic epilepsy and epileptic syndromes with simple partial seizures, intractable, without status epilepticus: Secondary | ICD-10-CM

## 2016-07-28 DIAGNOSIS — G40319 Generalized idiopathic epilepsy and epileptic syndromes, intractable, without status epilepticus: Secondary | ICD-10-CM

## 2016-08-11 ENCOUNTER — Ambulatory Visit (INDEPENDENT_AMBULATORY_CARE_PROVIDER_SITE_OTHER): Payer: Medicaid Other | Admitting: Pediatrics

## 2016-08-11 ENCOUNTER — Encounter (INDEPENDENT_AMBULATORY_CARE_PROVIDER_SITE_OTHER): Payer: Self-pay | Admitting: Pediatrics

## 2016-08-11 VITALS — BP 110/60 | HR 76 | Ht 63.25 in | Wt 124.6 lb

## 2016-08-11 DIAGNOSIS — G40319 Generalized idiopathic epilepsy and epileptic syndromes, intractable, without status epilepticus: Secondary | ICD-10-CM

## 2016-08-11 DIAGNOSIS — R2689 Other abnormalities of gait and mobility: Secondary | ICD-10-CM | POA: Diagnosis not present

## 2016-08-11 DIAGNOSIS — G40109 Localization-related (focal) (partial) symptomatic epilepsy and epileptic syndromes with simple partial seizures, not intractable, without status epilepticus: Secondary | ICD-10-CM

## 2016-08-11 DIAGNOSIS — E876 Hypokalemia: Secondary | ICD-10-CM

## 2016-08-11 DIAGNOSIS — G40119 Localization-related (focal) (partial) symptomatic epilepsy and epileptic syndromes with simple partial seizures, intractable, without status epilepticus: Secondary | ICD-10-CM

## 2016-08-11 MED ORDER — POTASSIUM PHOSPHATE MONOBASIC 500 MG PO TABS: ORAL_TABLET | ORAL | 5 refills | Status: DC

## 2016-08-11 MED ORDER — CARBAMAZEPINE 100 MG PO CHEW: CHEWABLE_TABLET | ORAL | 5 refills | Status: DC

## 2016-08-11 MED ORDER — TOPIRAMATE ER 200 MG PO CAP24: ORAL_CAPSULE | ORAL | 5 refills | Status: DC

## 2016-08-11 MED ORDER — KEPPRA 500 MG PO TABS: 1500.0000 mg | ORAL_TABLET | Freq: Two times a day (BID) | ORAL | 1 refills | Status: DC

## 2016-08-11 MED ORDER — POTASSIUM CHLORIDE CRYS ER 20 MEQ PO TBCR: 20.0000 meq | EXTENDED_RELEASE_TABLET | Freq: Four times a day (QID) | ORAL | 5 refills | Status: DC

## 2016-08-11 MED ORDER — LACOSAMIDE 50 MG PO TABS: ORAL_TABLET | ORAL | 5 refills | Status: DC

## 2016-08-11 MED ORDER — TROKENDI XR 50 MG PO CP24: ORAL_CAPSULE | ORAL | 5 refills | Status: DC

## 2016-08-11 NOTE — Progress Notes (Signed)
Patient: [name] MRN: [id number] Sex: male DOB: 1986/02/21  Provider: Jodi Geralds, MD Location of Care: [facility]  Note type: Routine return visit  History of Present Illness: Referral Source: Dr. Veronia Beets History from: mother, patient and [facility] chart Chief Complaint: VNS/Seizure  [name] is a 30 y.o. male who returns on August 11, 2016 for the first time since January 07, 2016.  He has intractable partial epilepsy with secondary generalization, postictal, and non-ictal migraines, early onset cataracts, intellectual disability, mitral regurgitation, calcifications in his basal ganglia MRI scan.  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  I sent him to Dr. Allyn Kenner for assistance with his seizures.  Dr. Johnney Killian has seen him on several occasions.  I had no contact from him.  He has increased [name]' [name], decreased his carbamazepine and Vimpat and in general [name] is experiencing somewhat improved seizure frequency.  Last Thursday he had a fairly severe seizure when the family had travelled to [facility].  He had apnea and required mouth-to-mouth resuscitation.  EMS was called.  As they arrived, he began to move back toward his baseline and so was not transported to the hospital.  In addition to his generalized seizures he also has some other seizures were that appeared to be complex partial and possibly even frontal lobe origin because he has movements of his limbs that he cannot control, but he seems to be awake.  Swiping his vagal nerve stimulator stopped those episodes completely.  Swiping the vagal nerve stimulator when he has generalized tonic-clonic seizure does not obviously abort it.  His health has not significantly changed.  He is sleeping well.  I interrogated and reprogrammed [name]' vagal nerve stimulator.  It is working well.  Results were noted  below.  Procedure: Interrogation and reprogramming of Vagal Nerve Stimulator  Implantation of the current stimulator 104 serial # [id number] implanted 04/19/14  Interrogation of the vagal nerve stimulator: Voltage 2.25 mA. Frequency 20 Hz, pulse width 250 microseconds, signal time on 7 seconds, signal time off 1.1 minutes, magnet current 2.50 mA, magnet signal time on 60 seconds, magnet pulse width 250 microseconds. I reprogrammed the computer for diagnostics.  The normal mode diagnostics using his current settings revealed the battery is nearly full, and communication, output, and impedance are good; 1774 ohms. 637 stimuli since implantation, 518 since September 26, 2014. There is no indication change the battery.  He tolerated the procedure well.  Review of Systems: 12 system review was remarkable for foot surgery in December; the remainder was assessed and was negative except as noted above and below  Past Medical History Diagnosis Date  . ALLERGIC RHINITIS 12/26/2009  . Blood clot in vein   . Brown's syndrome   . Growth disorder   . Heart murmur    moderate MR  . Hyperlipidemia    No therapy  . Hypokalemia   . Mental retardation, mild (I.Q. 50-70)   . MITRAL VALVE PROLAPSE 06/22/2007  . SEIZURE DISORDER 06/22/2007  . Sleep apnea    cpap , last sleep study 10/01/11  . Unspecified hypothyroidism 06/22/2007   Hospitalizations: No., Head Injury: No., Nervous System Infections: No., Immunizations up to date: Yes.    Diagnosis of developmental delay as a toddler.   EEG 11/07/86 was normal for gestational age.   Genetics evaluation short stature, prenatal onset, chromosomal study: 34 XY, fragile X syndrome negative, evaluation for MELAS and MERRF  were negative.  Initial seizure 08/28/94 left locomotor with secondary generalization.   MRI of the brain 09/13/93: Scattered subcortical white matter lesions at the parietotemporal parieto-occipital junction is ischemic versus hamartomas.    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   Diagnosis attention deficit disorder inattentive type treated without success with Cylert April 1995  MRI brain 04/04/94 stable differential diagnosis hamartoma, vasculitis, ischemic, or embolic phenomenon.   Status epilepticus 01/31/95, and 04/15/95 Neurontin added to Tegretol   MRI brain 06/14/96 unchanged   Admission to [facility] Tegretol 30 mcg for militate. 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   Topiramate started in May 1999 unable to be tolerated with Tegretol and was tapered and discontinued.  January 2000 EEG showed right greater than left mid temporal sharp waves was otherwise well-organized EEG. He hospitalizations in March, July, October, in November for recurrent seizures.  Patient placed on Depakote in April 2000 and developed gagging abdominal pain headache and had significant change in transaminases and liver functions. Topamax was restarted and Keppra was added.  Vimpat was started July 16, 2012 and adjusted upwards.   [name] was added on November 18, 2012 and has been adjusted upwards. Topamax was changed to Trokendi XR in attempt to deal with sleepiness, unsteadiness caused by polypharmacy. We to these medications have been introduced, the patient experiences somewhat improved seizure control however Is quite likely that despite better seizure control, the patient is experiencing impairment in cognition and gait as a result of polypharmacy .  MRI of the brain on March 21, 2013. The  patient has extensive calcification to the basal ganglia bilaterally, normal ventricle size, and no acute findings. EKG was unchanged  Birth History He was a breech presentation, cesarean section delivery. He had intrauterine growth retardation, and failure to thrive.   Behavior History Intellectual disability  Surgical History Procedure Laterality Date  . EYE SURGERY     X2 for Brown Syndrome  . IMPLANTATION VAGAL NERVE STIMULATOR     Left  . PORTACATH PLACEMENT     and removal 04/08/12   Family History family history includes Asthma in his father; Breast cancer in his mother; Congestive Heart Failure in his maternal grandfather; Coronary artery disease in his maternal grandfather and paternal grandfather; Diabetes in his father; Lung cancer in his paternal grandfather; Pneumonia in his maternal grandfather and paternal grandmother; Stroke in his paternal grandmother. Family history is negative for migraines, seizures, intellectual disabilities, blindness, deafness, birth defects, chromosomal disorder, or autism.  Social History . Marital status: Single    Spouse name: N/A  . Number of children: 0  . Years of education: N/A   Occupational History  . Disabled Unemployed   Social History Main Topics  . Smoking status: Never Smoker  . Smokeless tobacco: Never Used  . Alcohol use No  . Drug use: No  . 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     He lives with his parents.  He enjoys helping around the house, Nascar, and football.   Allergies Allergen Reactions  . Antihistamines, Chlorpheniramine-Type Other (See Comments)    Other Reaction: Dimetapp causes seizures  . Divalproex Sodium Other (See Comments)    Nausea, vomiting, liver and kidney dysfunction  . Phenytoin Rash and Other (See Comments)    Dilantin causes more seizures.   . Valproic Acid And  Related Other (See Comments)    Shuts down systems  . Vancomycin Rash, Other (See Comments) and Swelling    If given too fast, causes red man reaction Other Reaction: Redman's Syndrome   Physical Exam BP 110/60   Pulse 76   Ht 5' 3.25" (1.607 m)   Wt 124 lb 9.6 oz (56.5 kg)   BMI 21.90 kg/m   General: alert, Short stature, well nourished, in no acute distress, sandy hair, hazel eyes, right handed Head: microcephalic, Peak like nose, low-lying palate  Ears, Nose and Throat: Otoscopic: tympanic membranes wax occluding external auditory canals; pharynx: oropharynx is pink without exudates or tonsillar hypertrophy; Head is slightly turned with his right ear towards his right shoulder and chin point left without true torticollis his face and neck are assymmetrically swollen left greater than right Neck: supple, full range of motion, no cranial or cervical bruits Respiratory: auscultation clear Cardiovascular: I did not appreciate a murmur today pulses are normal Musculoskeletal: kyphosis, left equinus deformity of his foot in a boot, bilateral tight heel cords Skin: no rashes or neurocutaneous lesions  Neurologic Exam  Mental Status: alert; oriented to person, place and year; knowledge is normal for age; language is normal Cranial Nerves: visual fields are full to double simultaneous stimuli; extraocular movements are full and dysconjugate; he has [place] syndrome of the righteyeI with a narrowed palpebral fissure; pupils are round reactive to light; funduscopic examination shows sharp disc margins with normal vessels, bilateral optic nerve hypoplasia; symmetric facial strength; midline tongue and uvula; air conduction is greater than bone conduction bilaterally Motor: Normal strength, tone and mass; good fine motor movements; no pronator drift Sensory: intact responses to cold, vibration, proprioception and stereognosis Coordination: good finger-to-nose, rapid repetitive alternating movements  and finger apposition Gait and Station: Broad-based antalgic gait Reflexes: symmetric and absent bilaterally; no clonus; bilateral flexor plantar responses  Assessment 1. 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 2. 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 3. 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 4. Hypokalemia, E87.6. 5. 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  Discussion [name] is clinically stable and possibly seizure control is somewhat better with a higher dose of [name].  It is entirely appropriate to try to taper some of his other medications, but that is going to be done very slowly.  He is on polypharmacy for his seizures because his seizures have been intractable.  He is scheduled to have surgery on his left foot in December.  This has become both painful and significantly interferes with his gait.  Plan I refilled prescriptions for carbamazepine Keppra, Vimpat, Trokendi both large and small, K-Phos, and Klor-Con. I spent 30 minutes of face-to-face time with [name] and his mother and interrogated and reprogrammed his vagal nerve stimulator.  He will return to see me in three months' time.   Medication List   Accurate as of 08/11/16 11:59 PM.      acetaminophen 500 MG tablet Commonly known as:  TYLENOL Take 1,000 mg by mouth every 6 (six) hours as needed for headache.   albuterol 1.25 MG/3ML nebulizer solution Commonly known  as:  ACCUNEB Take 3 mLs by nebulization every 6 (six) hours as needed for shortness of breath.   carbamazepine 100 MG chewable tablet Commonly known as:  TEGRETOL Take 2.5 tabs in am, 2.5 tabs midday and 1.5 tabs in pm   cetirizine 10 MG tablet Commonly known as:  ZYRTEC Take 10 mg by mouth daily.   diazepam 2 MG tablet Commonly known as:  VALIUM Take 1 tablet every 6 - 8 hours as needed for anxiety or seizures   guaifenesin 400 MG Tabs tablet Commonly known as:  HUMIBID  E Take 400 mg by mouth 2 (two) times daily.   KEPPRA 500 MG tablet Generic drug:  levETIRAcetam Take 3 tablets (1,500 mg total) by mouth 2 (two) times daily.   lacosamide 50 MG Tabs tablet Commonly known as:  VIMPAT Take 2 in the AM then 2 at night   levothyroxine 25 MCG tablet Commonly known as:  SYNTHROID, LEVOTHROID Take 25 mcg by mouth daily before breakfast.   LORazepam 2 MG/ML concentrated solution Commonly known as:  ATIVAN Withdraw 64ml (2mg ) and 71ml  Normal saline into 3 ml syringe. Administer IV push over 3-5 minutes as needed for recurrent seizures   montelukast 10 MG tablet Commonly known as:  SINGULAIR Take 10 mg by mouth at bedtime.   multivitamin with minerals Tabs tablet Take 1 tablet by mouth daily.   ondansetron 4 MG disintegrating tablet Commonly known as:  ZOFRAN-ODT Take 1 tablet as needed for nausea and vomiting   [name] 10 MG tablet Generic drug:  cloBAZam Take 10 mg by mouth 3 (three) times daily. TAKES 1 TAB 3 TIMES DAILY   PHENObarbital 65 MG/ML injection Commonly known as:  LUMINAL Withdraw 82ml (65mg ) Phenobarbital and 16ml Normal Saline into 65ml syringe. Adminster IV push for 3-5 minutes as needed for recurrent seizures   potassium chloride SA 20 MEQ tablet Commonly known as:  KLOR-CON M20 Take 1 tablet (20 mEq total) by mouth 4 (four) times daily.   potassium phosphate (monobasic) 500 MG tablet Commonly known as:  K-PHOS TAKE 1 TABLET (500 MG TOTAL) BY MOUTH 4 (FOUR) TIMES DAILY.   rosuvastatin 10 MG tablet Commonly known as:  CRESTOR Take 10 mg by mouth at bedtime.   Topiramate ER 200 MG Cp24 Commonly known as:  TROKENDI XR Take 1 capsule by mouth at bedtime   TROKENDI XR 50 MG Cp24 Generic drug:  Topiramate ER Take 1 at night     The medication list was reviewed and reconciled. All changes or newly prescribed medications were explained.  A complete medication list was provided to the patient/caregiver.  Jodi Geralds  MD

## 2016-08-21 ENCOUNTER — Other Ambulatory Visit: Payer: Self-pay | Admitting: Family

## 2016-08-22 ENCOUNTER — Other Ambulatory Visit: Payer: Self-pay | Admitting: Family

## 2016-08-26 ENCOUNTER — Other Ambulatory Visit: Payer: Self-pay | Admitting: Pediatrics

## 2016-08-26 ENCOUNTER — Other Ambulatory Visit: Payer: Self-pay | Admitting: Family

## 2016-08-26 DIAGNOSIS — G40119 Localization-related (focal) (partial) symptomatic epilepsy and epileptic syndromes with simple partial seizures, intractable, without status epilepticus: Secondary | ICD-10-CM

## 2016-08-26 DIAGNOSIS — G40319 Generalized idiopathic epilepsy and epileptic syndromes, intractable, without status epilepticus: Secondary | ICD-10-CM

## 2016-09-17 ENCOUNTER — Other Ambulatory Visit (INDEPENDENT_AMBULATORY_CARE_PROVIDER_SITE_OTHER): Payer: Self-pay | Admitting: Pediatrics

## 2016-09-17 DIAGNOSIS — G40119 Localization-related (focal) (partial) symptomatic epilepsy and epileptic syndromes with simple partial seizures, intractable, without status epilepticus: Secondary | ICD-10-CM

## 2016-09-17 DIAGNOSIS — G40109 Localization-related (focal) (partial) symptomatic epilepsy and epileptic syndromes with simple partial seizures, not intractable, without status epilepticus: Secondary | ICD-10-CM

## 2016-09-17 DIAGNOSIS — G40319 Generalized idiopathic epilepsy and epileptic syndromes, intractable, without status epilepticus: Secondary | ICD-10-CM

## 2016-11-20 ENCOUNTER — Other Ambulatory Visit (INDEPENDENT_AMBULATORY_CARE_PROVIDER_SITE_OTHER): Payer: Self-pay | Admitting: Family

## 2016-11-20 DIAGNOSIS — G40319 Generalized idiopathic epilepsy and epileptic syndromes, intractable, without status epilepticus: Secondary | ICD-10-CM

## 2016-11-20 DIAGNOSIS — G40401 Other generalized epilepsy and epileptic syndromes, not intractable, with status epilepticus: Secondary | ICD-10-CM

## 2016-11-20 MED ORDER — LORAZEPAM 2 MG/ML PO CONC: ORAL | 5 refills | Status: DC

## 2016-11-20 MED ORDER — PHENOBARBITAL SODIUM 65 MG/ML IJ SOLN: INTRAMUSCULAR | 5 refills | Status: DC

## 2016-12-03 IMAGING — CR DG CHEST 1V PORT
1 series · 1 of 1 positions shown · non-contrast
Comparison: September 29, 2015

CLINICAL DATA: Cyanotic episode. History of seizures. CPR performed
prior to arrival at hospital

EXAM:
PORTABLE CHEST 1 VIEW

[AP]
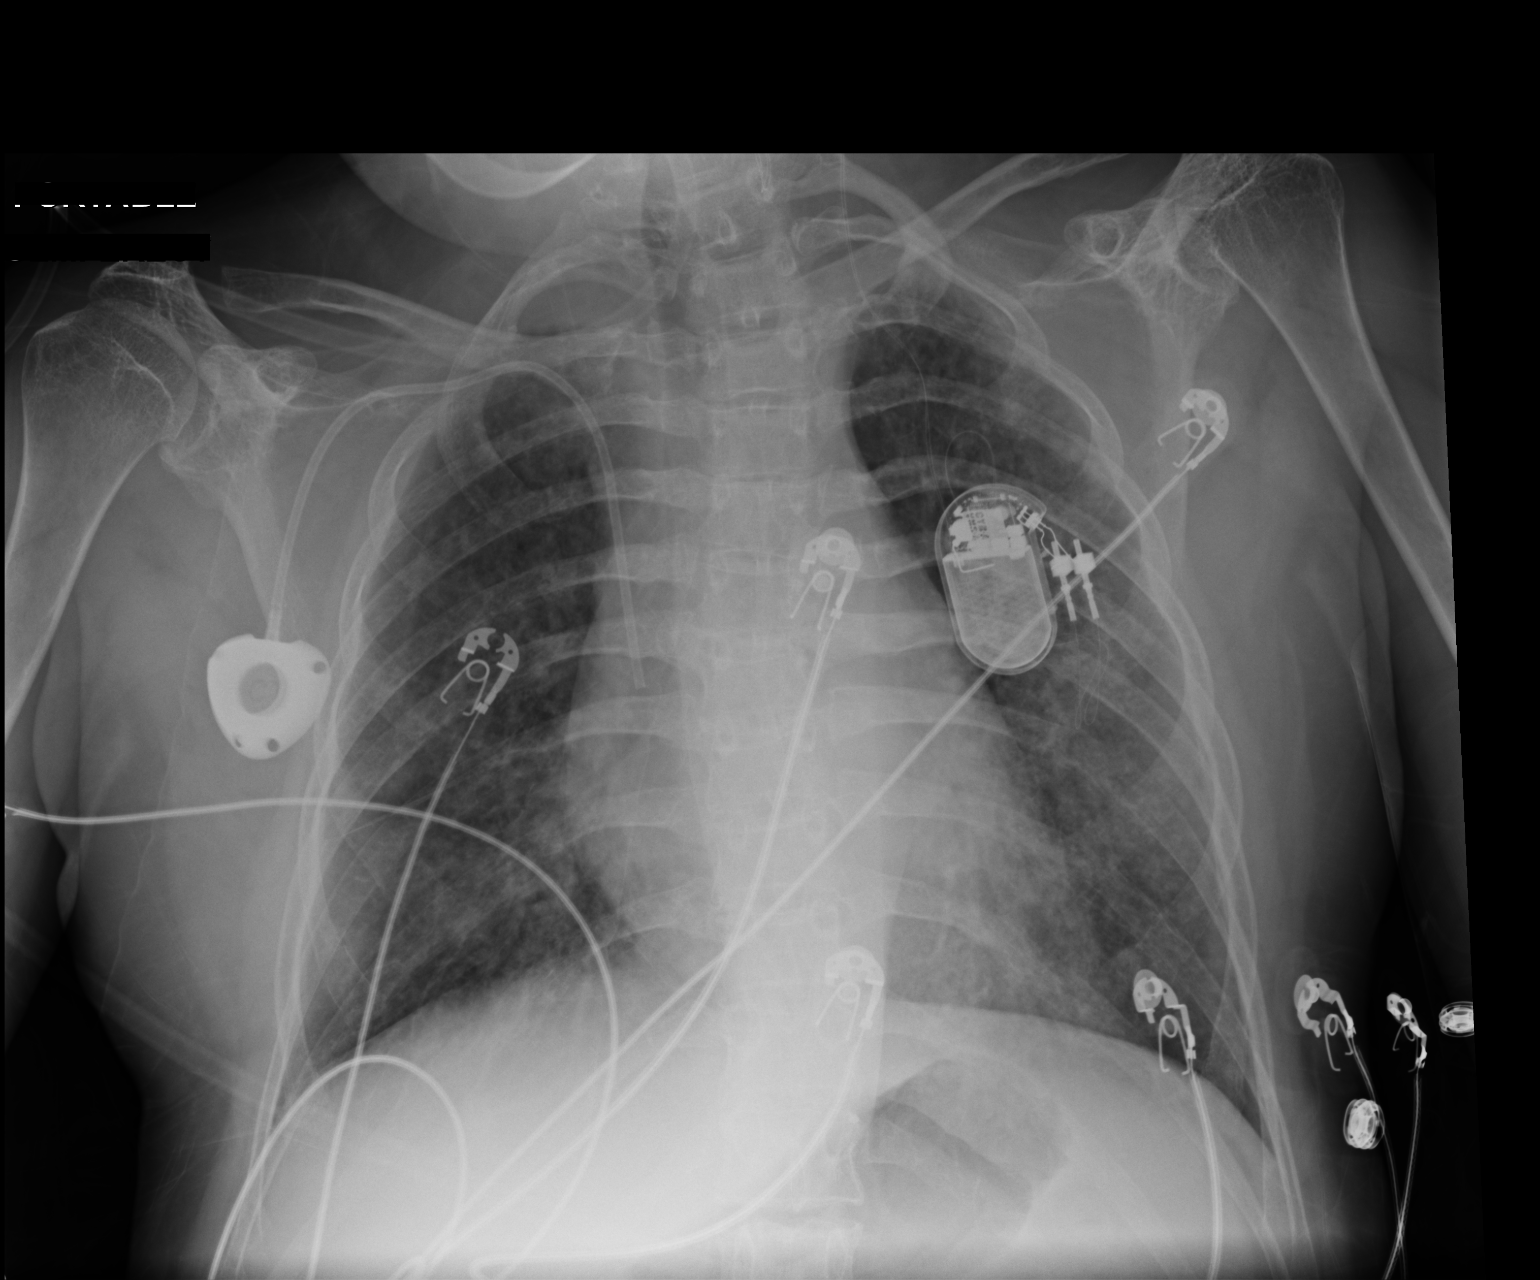

[1 of 1 positions shown; findings below may reference images not displayed]

FINDINGS: [name] tip is in the superior vena cava. No pneumothorax. There
is a stimulator on the left extending to the left cervical -thoracic
junction, stable.

There is mild generalized interstitial edema. Lungs elsewhere clear.
Heart size and pulmonary vascularity are normal. No adenopathy. No
bone lesions.
IMPRESSION: Mild generalized interstitial edema. Suspect noncardiogenic etiology
given clinical history and the absence of cardiac enlargement. No
airspace consolidation. No pneumothorax.

## 2016-12-09 ENCOUNTER — Encounter: Payer: Self-pay | Admitting: Cardiovascular Disease

## 2016-12-10 ENCOUNTER — Encounter (INDEPENDENT_AMBULATORY_CARE_PROVIDER_SITE_OTHER): Payer: Self-pay | Admitting: *Deleted

## 2016-12-19 ENCOUNTER — Ambulatory Visit (INDEPENDENT_AMBULATORY_CARE_PROVIDER_SITE_OTHER): Payer: Medicaid Other | Admitting: Cardiovascular Disease

## 2016-12-19 ENCOUNTER — Encounter: Payer: Self-pay | Admitting: Cardiovascular Disease

## 2016-12-19 VITALS — BP 90/62 | HR 59 | Ht 63.0 in | Wt 125.0 lb

## 2016-12-19 DIAGNOSIS — I340 Nonrheumatic mitral (valve) insufficiency: Secondary | ICD-10-CM | POA: Diagnosis not present

## 2016-12-19 NOTE — Progress Notes (Signed)
Chief Complaint  Patient presents with  . 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 He was initially seen as a new consult for his atrial fibrillation by Dr. Jolyn Nap on 04/08/12. He was admitted on 04/08/12 to get a [name] replacement. They removed the old [name] but were not able to insert a new one because of right-sided central venous occlusion. During wire manipulatoin, he developed rapid atrial fibrillation.  In PACU, the atrial fibrillation resolved without medical intervention. He has had no atrial fibrillation since then. He has frequent seizures for many years. Cardiac monitor in 2015 showed sinus rhythm with no arrhythmias. Last echo February 2017 with normal LV function, moderate mitral regurgitation.  He is here today for follow up. He feels well. No chest pain or SOB. No awareness of palpitations.   Primary Care Physician: Chesley Noon, MD   Past Medical History:  Diagnosis Date  . ALLERGIC RHINITIS 12/26/2009  . Blood clot in vein   . Brown's syndrome   . Growth disorder   . Heart murmur    moderate MR  . Hyperlipidemia    No therapy  . Hypokalemia   . Mental retardation, mild (I.Q. 50-70)   . MITRAL VALVE PROLAPSE 06/22/2007  . SEIZURE DISORDER 06/22/2007  . Sleep apnea    cpap , last sleep study 10/01/11  . Unspecified hypothyroidism 06/22/2007    Past Surgical History:  Procedure Laterality Date  . EYE SURGERY     X2 for Brown Syndrome  . IMPLANTATION VAGAL NERVE STIMULATOR     Left  . PORTACATH PLACEMENT     and removal 04/08/12    Current Outpatient Prescriptions  Medication Sig Dispense Refill  . acetaminophen (TYLENOL) 500 MG tablet Take 1,000 mg by mouth every 6 (six) hours as needed for headache.    .  albuterol (ACCUNEB) 1.25 MG/3ML nebulizer solution Take 3 mLs by nebulization every 6 (six) hours as needed for shortness of breath.     . carbamazepine (TEGRETOL) 100 MG chewable tablet Chew 2+1/2 tablets in the morning, 2+1/2 tablets at midday and 1+1/2 tablets in the evening 220 tablet 5  . carbamazepine (TEGRETOL) 100 MG chewable tablet Chew 100 mg by mouth 3 (three) times daily. 1 tablet in the morning 1 tablet in the evening 2 tablets at bedtime    . carbamazepine (TEGRETOL) 100 MG chewable tablet Chew 100 mg by mouth 2 (two) times daily.    . cetirizine (ZYRTEC) 10 MG tablet Take 10 mg by mouth daily.    . diazepam (VALIUM) 2 MG tablet Take 1 tablet every 6 - 8 hours as needed for anxiety or seizures 30 tablet 1  . guaifenesin (HUMIBID E) 400 MG TABS tablet Take 400 mg by mouth 2 (two) times daily.    Marland Kitchen K-PHOS 500 MG tablet TAKE 1 TABLET (500 MG TOTAL) BY MOUTH 4 (FOUR) TIMES DAILY. 124 tablet 5  . KEPPRA 500 MG tablet Take 3 tablets (1,500 mg total) by mouth 2 (two) times daily. 186 tablet 1  . lacosamide (VIMPAT) 50 MG TABS tablet Take 2 in the AM then 2 at night 124 tablet 5  . levothyroxine (SYNTHROID, LEVOTHROID) 25 MCG tablet Take 25 mcg by mouth daily before breakfast.    . LORazepam (ATIVAN) 2 MG/ML concentrated solution  Withdraw 86ml (2mg ) and 65ml  Normal saline into 3 ml syringe. Administer IV push over 3-5 minutes as needed for recurrent seizures 5 mL 5  . montelukast (SINGULAIR) 10 MG tablet Take 10 mg by mouth at bedtime.  6  . Multiple Vitamin (MULTIVITAMIN WITH MINERALS) TABS Take 1 tablet by mouth daily.    . ondansetron (ZOFRAN-ODT) 4 MG disintegrating tablet Take 1 tablet as needed for nausea and vomiting 20 tablet 5  . [name] 10 MG tablet Take 10 mg by mouth 3 (three) times daily. TAKES 1 TAB 3 TIMES DAILY 60 tablet 5  . PHENObarbital (LUMINAL) 65 MG/ML injection Withdraw 45ml (65mg ) Phenobarbital and 75ml Normal Saline into 80ml syringe. Adminster IV push for 3-5 minutes as  needed for recurrent seizures 5 mL 5  . potassium phosphate, monobasic, (K-PHOS) 500 MG tablet TAKE 1 TABLET (500 MG TOTAL) BY MOUTH 4 (FOUR) TIMES DAILY. 124 tablet 5  . rosuvastatin (CRESTOR) 10 MG tablet Take 10 mg by mouth at bedtime.  5  . Topiramate ER (TROKENDI XR) 200 MG CP24 Take 1 capsule by mouth at bedtime 31 capsule 5  . TROKENDI XR 50 MG CP24 TAKE 2 CAPSULES BY MOUTH AT BEDTIME 62 capsule 4   No current facility-administered medications for this visit.     Allergies  Allergen Reactions  . Antihistamines, Chlorpheniramine-Type Other (See Comments)    Other Reaction: Dimetapp causes seizures  . Divalproex Sodium Other (See Comments)    Nausea, vomiting, liver and kidney dysfunction  . Phenytoin Rash and Other (See Comments)    Dilantin causes more seizures.   . Valproic Acid And Related Other (See Comments)    Shuts down systems  . Vancomycin Rash, Other (See Comments) and Swelling    If given too fast, causes red man reaction Other Reaction: Redman's Syndrome    Social History   Social History  . Marital status: Single    Spouse name: N/A  . Number of children: 0  . Years of education: N/A   Occupational History  . Disabled Unemployed   Social History Main Topics  . Smoking status: Never Smoker  . Smokeless tobacco: Never Used  . Alcohol use No  . Drug use: No  . Sexual activity: No   Other Topics Concern  . 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    He lives with his parents.    He enjoys helping around the house, Nascar, and football.    Family History  Problem Relation Age of Onset  . Breast cancer Mother   . Asthma Father   . Diabetes Father   . Coronary artery disease Maternal Grandfather   . Congestive Heart Failure Maternal Grandfather     Died at 40  . Pneumonia Maternal Grandfather   . Coronary artery disease Paternal Grandfather     . Lung cancer Paternal Grandfather     Died at 21  . Pneumonia Paternal Grandmother     Died at 64  . Stroke Paternal Grandmother     Review of Systems:  As stated in the HPI and otherwise negative.   BP 90/62   Pulse (!) 59   Ht 5\' 3"  (1.6 m)   Wt 125 lb (56.7 kg) Comment: Pt family member stated weight  SpO2 97%   BMI 22.14 kg/m   Physical Examination: General: Well developed,  well nourished, NAD  HEENT: OP clear, mucus membranes moist  SKIN: warm, dry. No rashes. Neuro: No focal deficits  Musculoskeletal: Muscle strength 5/5 all ext  Psychiatric: Mood and affect normal  Neck: No JVD, no carotid bruits, no thyromegaly, no lymphadenopathy.  Lungs:Clear bilaterally, no wheezes, rhonci, crackles Cardiovascular: Regular rate and rhythm. Slight systolic  Murmur. No gallops or rubs. Abdomen:Soft. Bowel sounds present. Non-tender.  Extremities: No lower extremity edema. Pulses are 2 + in the bilateral DP/PT.  Echo 10/29/15: Left ventricle: The cavity size was mildly dilated. Wall   thickness was normal. The estimated ejection fraction was 55%.   Doppler parameters are consistent with abnormal left ventricular   relaxation (grade 1 diastolic dysfunction). - Mitral valve: There was moderate regurgitation. - Left atrium: The atrium was mildly dilated. - Atrial septum: No defect or patent foramen ovale was identified.   EKG:  EKG is not  ordered today. The ekg ordered today demonstrates   Recent Labs: 06/12/2016: ALT 16 06/13/2016: Magnesium 1.7 06/14/2016: BUN <5; Creatinine, Ser 0.68; Hemoglobin 9.3; Platelets 245; Potassium 3.4; Sodium 138   Lipid Panel No results found for: CHOL, TRIG, HDL, CHOLHDL, VLDL, LDLCALC, LDLDIRECT   Wt Readings from Last 3 Encounters:  12/19/16 125 lb (56.7 kg)  08/11/16 124 lb 9.6 oz (56.5 kg)  06/13/16 129 lb 10.1 oz (58.8 kg)     Other studies Reviewed: Additional studies/ records that were reviewed today include:  Review of the above  records demonstrates:    Assessment and Plan:   1. Atrial fibrillation, paroxysmal: One episode during a procedure in July 2013. Likely induced by the wire. No recurrence. He is in normal sinus rhythm today. No further workup or treatment.      2. Mitral regurgitation: Moderate MR by echo 2017. Repeat echo in April 2019.   Current medicines are reviewed at length with the patient today.  The patient does not have concerns regarding medicines.  The following changes have been made:  no change  Labs/ tests ordered today include:   No orders of the defined types were placed in this encounter.   Disposition:   FU with me in 12  months  Signed, Lauree Chandler, MD 12/19/2016 10:55 AM    [facility] [address], [place], [place]  [address] Phone: [phone]; Fax: [phone]

## 2016-12-19 NOTE — Patient Instructions (Signed)

## 2016-12-24 ENCOUNTER — Ambulatory Visit (INDEPENDENT_AMBULATORY_CARE_PROVIDER_SITE_OTHER): Payer: Medicaid Other | Admitting: Pediatrics

## 2016-12-24 ENCOUNTER — Encounter (INDEPENDENT_AMBULATORY_CARE_PROVIDER_SITE_OTHER): Payer: Self-pay | Admitting: Pediatrics

## 2016-12-24 VITALS — BP 112/78 | HR 76 | Ht 63.25 in | Wt 125.0 lb

## 2016-12-24 DIAGNOSIS — G40119 Localization-related (focal) (partial) symptomatic epilepsy and epileptic syndromes with simple partial seizures, intractable, without status epilepticus: Secondary | ICD-10-CM

## 2016-12-24 DIAGNOSIS — E876 Hypokalemia: Secondary | ICD-10-CM

## 2016-12-24 DIAGNOSIS — G40109 Localization-related (focal) (partial) symptomatic epilepsy and epileptic syndromes with simple partial seizures, not intractable, without status epilepticus: Secondary | ICD-10-CM | POA: Diagnosis not present

## 2016-12-24 DIAGNOSIS — G40209 Localization-related (focal) (partial) symptomatic epilepsy and epileptic syndromes with complex partial seizures, not intractable, without status epilepticus: Secondary | ICD-10-CM | POA: Diagnosis not present

## 2016-12-24 DIAGNOSIS — G40319 Generalized idiopathic epilepsy and epileptic syndromes, intractable, without status epilepticus: Secondary | ICD-10-CM

## 2016-12-24 MED ORDER — DIAZEPAM 2 MG PO TABS: ORAL_TABLET | ORAL | 5 refills | Status: DC

## 2016-12-24 MED ORDER — TOPIRAMATE ER 200 MG PO CAP24: ORAL_CAPSULE | ORAL | 5 refills | Status: DC

## 2016-12-24 MED ORDER — ONFI 10 MG PO TABS: ORAL_TABLET | ORAL | 5 refills | Status: DC

## 2016-12-24 MED ORDER — LACOSAMIDE 50 MG PO TABS: ORAL_TABLET | ORAL | 5 refills | Status: DC

## 2016-12-24 MED ORDER — POTASSIUM PHOSPHATE MONOBASIC 500 MG PO TABS: ORAL_TABLET | ORAL | 5 refills | Status: DC

## 2016-12-24 MED ORDER — CARBAMAZEPINE 100 MG PO CHEW: CHEWABLE_TABLET | ORAL | 5 refills | Status: DC

## 2016-12-24 MED ORDER — TOPIRAMATE ER 50 MG PO CAP24: 2.0000 | ORAL_CAPSULE | Freq: Every day | ORAL | 4 refills | Status: DC

## 2016-12-24 NOTE — Progress Notes (Signed)
Patient: [name] MRN: [id number] Sex: male DOB: 1986/02/03  Provider: Wyline Copas, MD Location of Care: [facility]  Note type: Routine return visit  History of Present Illness: Referral Source: Dr. Veronia Beets History from: both parents, patient and [facility] chart Chief Complaint: VNS/Seizure  [name] is a 31 y.o. male who returns on December 24, 2016, for the first time since August 11, 2016.  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  I have managed him jointly with Dr. Jeoffrey Massed at [facility].  He sees him about twice a year.  He has steadily increased [name] and decreased carbamazepine since last time I saw him.  [name] has an implanted vagal nerve stimulator, which is working well and he came today to re-interrogate the device.  His parents have kept detailed records of his seizures.  This is recounted below.  On September 25, 2016, he had a cluster of IV generalized tonic-clonic seizures each lasting at least a few minutes in duration.  They accessed the port and gave him phenobarbital and Ativan, which stopped the cluster.  He had the cluster of 6 seizures on October 15, 2016, and again they accessed the port and treated him with phenobarbital and lorazepam.  On October 27, 2016, he had seizures that were partial in nature where it appears that he is climbing a wall, but is unresponsive.  They swiped the vagal nerve stimulator, which is the first step to treat any of his seizures and the event stopped.  The same thing happened on December 11, 2016.  On November 17, 2016, he had a three to  four minute generalized tonic-clonic seizure without cluster.  On November 28, 2016, he had a five to six minutes generalized tonic-clonic seizure without cluster.  On December 05, 2016, he had a seizure lasting six to seven minutes.  He had a minute and a half of apnea.  Most of the other times is only about a half minute On December 22, 2016, he had a two to three minute generalized tonic-clonic seizure.  While these seizures appear frequent, this is actually among the best control that he has had.    He has not required any emergency room visits during this time.  [name] was transported in a wheelchair.  He had surgery on his ankle on October 03, 2016.  It was complex and intended to reposition it.  He has been unable to bear weight on it, which is the reason that he is in a wheelchair.  His general health has been stable.  The family has been able to travel to this destinations around the [place] and take [place] with them.  Other than carbamazepine and [name], his medicines have not changed since his last visit.  Procedure: Interrogation and reprogramming of Vagal Nerve Stimulator  Implantation of the current stimulator 104 serial # [id number] implanted 04/19/14   Interrogation of the vagal nerve stimulator: Voltage 2.25 mA. Frequency 20 Hz, pulse width 250 microseconds, signal time on 7 seconds, signal time off 1.1 minutes, magnet current 2.50 mA, magnet signal time on 60 seconds, magnet pulse width 250 microseconds. I reprogrammed the computer for diagnostics.  The normal mode diagnostics using  his current settings revealed the battery is nearly full, and communication, output, and impedance are good; 1819 ohms. 1664 stimuli since implantation, 1027 since August 11, 2016. There is no indication change the battery.  He tolerated the procedure well.  Review of Systems: 12 system review was remarkable for had ankle surgery on January 12  Past Medical History Diagnosis Date  . ALLERGIC RHINITIS  12/26/2009  . Blood clot in vein   . Brown's syndrome   . Growth disorder   . Heart murmur    moderate MR  . Hyperlipidemia    No therapy  . Hypokalemia   . Mental retardation, mild (I.Q. 50-70)   . MITRAL VALVE PROLAPSE 06/22/2007  . SEIZURE DISORDER 06/22/2007  . Sleep apnea    cpap , last sleep study 10/01/11  . Unspecified hypothyroidism 06/22/2007   Hospitalizations: Yes.  , Head Injury: No., Nervous System Infections: No., Immunizations up to date: Yes.    Diagnosis of developmental delay as a toddler.   EEG 11/07/86 was normal for gestational age.   Genetics evaluation short stature, prenatal onset, chromosomal study: 38 XY, fragile X syndrome negative, evaluation for MELAS and MERRF were negative.  Initial seizure 08/28/94 left locomotor with secondary generalization.   MRI of the brain 09/13/93: Scattered subcortical white matter lesions at the parietotemporal parieto-occipital junction is ischemic versus hamartomas.   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   Diagnosis attention deficit disorder inattentive type treated without success with Cylert April 1995  MRI brain 04/04/94 stable differential diagnosis hamartoma, vasculitis, ischemic, or embolic phenomenon.   Status epilepticus 01/31/95, and 04/15/95 Neurontin added to Tegretol   MRI brain 06/14/96 unchanged   Admission to [facility]. Tegretol 30 mcg for militate. 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   Topiramate started in May 1999 unable to be tolerated with Tegretol and  was tapered and discontinued.  January 2000 EEG showed right greater than left mid temporal sharp waves was otherwise well-organized EEG. He hospitalizations in March, July, October, in November for recurrent seizures.  Patient placed on Depakote in April 2000 and developed gagging abdominal pain headache and had significant change in transaminases and liver functions. Topamax was restarted and Keppra was added.  Vimpat was started July 16, 2012 and adjusted upwards.   [name] was added on November 18, 2012 and has been adjusted upwards. Topamax was changed to Trokendi XR in attempt to deal with sleepiness, unsteadiness caused by polypharmacy. We to these medications have been introduced, the patient experiences somewhat improved seizure control however Is quite likely that despite better seizure control, the patient is experiencing impairment in cognition and gait as a result of polypharmacy .  MRI of the brain on March 21, 2013. The patient has extensive calcification to the basal ganglia bilaterally, normal ventricle size, and no acute findings. EKG was unchanged  Birth History He was a breech presentation, cesarean section delivery. He had intrauterine growth retardation, and failure to thrive.  Behavior History Mild intellectual disability  Surgical History Procedure Laterality Date  . EYE SURGERY     X2 for Brown Syndrome  .  IMPLANTATION VAGAL NERVE STIMULATOR     Left  . PORTACATH PLACEMENT     and removal 04/08/12   Family History family history includes Asthma in his father; Breast cancer in his mother; Congestive Heart Failure in his maternal grandfather; Coronary artery disease in his maternal grandfather and paternal grandfather; Diabetes in his father; Lung cancer in his paternal grandfather; Pneumonia in his maternal grandfather and paternal grandmother; Stroke in his paternal grandmother. Family history is negative for migraines, seizures, intellectual disabilities,  blindness, deafness, birth defects, chromosomal disorder, or autism.  Social History . Marital status: Single   Occupational History  . Disabled Unemployed   Social History Main Topics  . Smoking status: Never Smoker  . Smokeless tobacco: Never Used  . Alcohol use No  . Drug use: No  . 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     He lives with his parents.     He enjoys helping around the house, Nascar, and football.   Allergies Allergen Reactions  . Antihistamines, Chlorpheniramine-Type Other (See Comments)    Other Reaction: Dimetapp causes seizures  . Divalproex Sodium Other (See Comments)    Nausea, vomiting, liver and kidney dysfunction  . Phenytoin Rash and Other (See Comments)    Dilantin causes more seizures.   . Valproic Acid And Related Other (See Comments)    Shuts down systems  . Vancomycin Rash, Other (See Comments) and Swelling    If given too fast, causes red man reaction Other Reaction: Redman's Syndrome   Physical Exam BP 112/78   Pulse 76   Ht 5' 3.25" (1.607 m)   Wt 125 lb (56.7 kg)   BMI 21.97 kg/m   General: alert, well developed, well nourished, in no acute distress, sandy hair, hazel eyes, right handed Head: microcephalic, beak-like nose, low-lying palate Ears, Nose and Throat: Otoscopic: tympanic membranes are occluded by wax; pharynx: oropharynx is pink without exudates or tonsillar hypertrophy.  On his head is turned with his right ear towards his right shoulder chin points to the left; his face and neck are asymmetrically swollen left greater than right Neck: supple, full range of motion, no cranial or cervical bruits Respiratory: auscultation clear Cardiovascular: no murmurs, pulses are normal Musculoskeletal: kyphosis, his left foot is in a boot, bilateral tight heel cords Skin: no rashes or neurocutaneous  lesions  Neurologic Exam  Mental Status: alert; oriented to person; knowledge is below normal for age; language is below normal, but he is able to name objects follow commands, he has some problems with articulation and at times is hard to understand, particularly when he talks quickly Cranial Nerves: visual fields are full to double simultaneous stimuli; extraocular movements are full and dysconjugate, 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; pupils are round reactive to light; funduscopic examination shows bilateral positive red reflex he had some photophobia which made it difficult; symmetric facial strength; midline tongue and uvula; air conduction is greater than bone conduction bilaterally Motor: Normal functional strength, tone and mass; clumsy fine motor movements; no pronator drift Sensory: intact responses to cold, vibration, proprioception and stereognosis Coordination: good finger-to-nose; rapid repetitive alternating movements and finger apposition are clumsy Gait and Station: [employer] because he cannot bear weight on his left leg yet Reflexes: symmetric and diminished bilaterally; no clonus; bilateral flexor plantar responses  Assessment 1. 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 2. 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 3. 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 4. Hypokalemia, E87.6.  Discussion [name] is medically and neurologically stable.  I did not make any changes in his medicine today.  I interrogated the vagal nerve stimulator, which is working well.  The results of that are noted above.  Plan I spent 30 minutes of face-to-face time with [name], interrogated his vagal nerve stimulator, refilled seven prescriptions, discussed continuing his current management and the relationship with Dr. Lisabeth Devoid at [facility].  He will return to see me in six months, but I will  be happy to see him sooner based on clinical need.   Medication List   Accurate as of 12/24/16  3:28 PM.      acetaminophen 500 MG tablet Commonly known as:  TYLENOL Take 1,000 mg by mouth every 6 (six) hours as needed for headache.   albuterol 1.25 MG/3ML nebulizer solution Commonly known as:  ACCUNEB Take 3 mLs by nebulization every 6 (six) hours as needed for shortness of breath.   carbamazepine 100 MG chewable tablet Commonly known as:  TEGRETOL Chew 100 mg by mouth 3 (three) times daily. 1 tablet in the morning 1 tablet in the evening 2 tablets at bedtime   carbamazepine 100 MG chewable tablet Commonly known as:  TEGRETOL Chew 100 mg by mouth 2 (two) times daily.   carbamazepine 100 MG chewable tablet Commonly known as:  TEGRETOL Chew 2+1/2 tablets in the morning, 2+1/2 tablets at midday and 1+1/2 tablets in the evening   cetirizine 10 MG tablet Commonly known as:  ZYRTEC Take 10 mg by mouth daily.   diazepam 2 MG tablet Commonly known as:  VALIUM Take 1 tablet every 6 - 8 hours as needed for anxiety or seizures   guaifenesin 400 MG Tabs tablet Commonly known as:  HUMIBID E Take 400 mg by mouth 2 (two) times daily.   KEPPRA 500 MG tablet Generic drug:  levETIRAcetam Take 3 tablets (1,500 mg total) by mouth 2 (two) times daily.   lacosamide 50 MG Tabs tablet Commonly known as:  VIMPAT Take 2 in the AM then 2 at night   levothyroxine 25 MCG tablet Commonly known as:  SYNTHROID, LEVOTHROID Take 25 mcg by mouth daily before breakfast.   LORazepam 2 MG/ML concentrated solution Commonly known as:  ATIVAN Withdraw 17ml (2mg ) and 49ml  Normal saline into 3 ml syringe. Administer IV push over 3-5 minutes as needed for recurrent seizures   montelukast 10 MG tablet Commonly known as:  SINGULAIR Take 10 mg by mouth at bedtime.   multivitamin with minerals Tabs tablet Take 1 tablet by mouth daily.   ondansetron 4 MG disintegrating tablet Commonly known as:   ZOFRAN-ODT Take 1 tablet as needed for nausea and vomiting   [name] 10 MG tablet Generic drug:  cloBAZam Take 10 mg by mouth 3 (three) times daily. Take 1 tab in the morning, 1 tab in the afternoon, and 2 tabs at bedtime   PHENObarbital 65 MG/ML injection Commonly known as:  LUMINAL Withdraw 78ml (65mg ) Phenobarbital and 43ml Normal Saline into 87ml syringe. Adminster IV push for 3-5 minutes as needed for recurrent seizures   potassium phosphate (monobasic) 500 MG tablet Commonly known as:  K-PHOS TAKE 1 TABLET (500 MG TOTAL) BY MOUTH 4 (FOUR) TIMES DAILY.   K-PHOS 500 MG tablet Generic drug:  potassium phosphate (monobasic) TAKE 1 TABLET (500 MG TOTAL)  BY MOUTH 4 (FOUR) TIMES DAILY.   rosuvastatin 10 MG tablet Commonly known as:  CRESTOR Take 10 mg by mouth at bedtime.   Topiramate ER 200 MG Cp24 Commonly known as:  TROKENDI XR Take 1 capsule by mouth at bedtime   TROKENDI XR 50 MG Cp24 Generic drug:  Topiramate ER TAKE 2 CAPSULES BY MOUTH AT BEDTIME    The medication list was reviewed and reconciled. All changes or newly prescribed medications were explained.  A complete medication list was provided to the patient/caregiver.  Jodi Geralds MD

## 2017-01-16 ENCOUNTER — Telehealth: Payer: Self-pay | Admitting: Pediatrics

## 2017-01-16 DIAGNOSIS — G40319 Generalized idiopathic epilepsy and epileptic syndromes, intractable, without status epilepticus: Secondary | ICD-10-CM

## 2017-01-16 DIAGNOSIS — G40119 Localization-related (focal) (partial) symptomatic epilepsy and epileptic syndromes with simple partial seizures, intractable, without status epilepticus: Secondary | ICD-10-CM

## 2017-01-16 DIAGNOSIS — G40109 Localization-related (focal) (partial) symptomatic epilepsy and epileptic syndromes with simple partial seizures, not intractable, without status epilepticus: Secondary | ICD-10-CM

## 2017-01-16 MED ORDER — KEPPRA 500 MG PO TABS: 1500.0000 mg | ORAL_TABLET | Freq: Two times a day (BID) | ORAL | 5 refills | Status: DC

## 2017-01-16 NOTE — Telephone Encounter (Signed)
  Who's calling (name and relationship to patient) :mom; Doris  Best contact number: [phone] or [phone] Provider they BTC:YELYHTMB  Reason for call:Mom said they are waiting on refill Keppa. She also stated that they had changed pharmacies. [facility] [address]     PRESCRIPTION REFILL ONLY  Name of prescription:Keppa  [facility] [address]

## 2017-01-16 NOTE — Telephone Encounter (Signed)
Call to mom Tamela Oddi- [phone]- reached at [phone] wanted to confirm which [facility] [address]- mom reports address is [address]- phone number confirmed for pharm.

## 2017-01-16 NOTE — Telephone Encounter (Signed)
I faxed the Rx to the pharmacy as requested. TG

## 2017-03-07 ENCOUNTER — Other Ambulatory Visit: Payer: Self-pay | Admitting: Family

## 2017-03-26 ENCOUNTER — Telehealth (INDEPENDENT_AMBULATORY_CARE_PROVIDER_SITE_OTHER): Payer: Self-pay | Admitting: Pediatrics

## 2017-03-26 DIAGNOSIS — G40109 Localization-related (focal) (partial) symptomatic epilepsy and epileptic syndromes with simple partial seizures, not intractable, without status epilepticus: Secondary | ICD-10-CM

## 2017-03-26 DIAGNOSIS — G40119 Localization-related (focal) (partial) symptomatic epilepsy and epileptic syndromes with simple partial seizures, intractable, without status epilepticus: Secondary | ICD-10-CM

## 2017-03-26 DIAGNOSIS — G40319 Generalized idiopathic epilepsy and epileptic syndromes, intractable, without status epilepticus: Secondary | ICD-10-CM

## 2017-03-26 MED ORDER — LACOSAMIDE 50 MG PO TABS: ORAL_TABLET | ORAL | 4 refills | Status: DC

## 2017-03-26 NOTE — Telephone Encounter (Signed)
Sp[facility] at [facility]. She stated that they were needing a new rx for Vimpat 50 mg. She said that their computer does not spit out hard copies anymore so they just requested a new one. The rx has been printed and faxed

## 2017-03-26 NOTE — Telephone Encounter (Signed)
°  Who's calling (name and relationship to patient) : Lattie Haw - [place] contact number: [phone] Provider they see: Gaynell Face  Reason for call: Left a voice message that they need new order for patient.  [facility] and they misplaced the order and need a new one for the patient.  Please call for which medication. (could not understand which medication, but it sound like Zofran 16mg )    PRESCRIPTION REFILL ONLY  Name of prescription:  Pharmacy:

## 2017-04-06 ENCOUNTER — Telehealth (INDEPENDENT_AMBULATORY_CARE_PROVIDER_SITE_OTHER): Payer: Self-pay | Admitting: Family

## 2017-04-06 DIAGNOSIS — G4733 Obstructive sleep apnea (adult) (pediatric): Secondary | ICD-10-CM

## 2017-04-06 NOTE — Telephone Encounter (Signed)
I called and talked to Mom. She said that what [name] needed was new CPAP machine, that his was over 30 years old and was broken. I will contact [place] to see what needs to be ordered. TG

## 2017-04-06 NOTE — Telephone Encounter (Signed)
I left a message and asked for Mom to call me back so that I can get more information. TG

## 2017-04-06 NOTE — Telephone Encounter (Signed)
°  Who's calling (name and relationship to patient) : Tamela Oddi (mom) Best contact number: [phone] Provider they see: Cloretta [name] Reason for call: Mom called and left voice message to send a Rx to [place] for a C-pack, patient C-pack is broken.  Need a new one asap.      PRESCRIPTION REFILL ONLY  Name of prescription:  Pharmacy:

## 2017-04-07 NOTE — Telephone Encounter (Signed)
I received a fax from [facility] regarding the replacement of the CPAP machine. The fax stated that they needed office notes dated within the last 6 months that spoke to the use of the CPAP equipment and benefit of therapy in order to replace his current CPAP machine as the last machine was issued to [facility] in 2013 and that the pressure at that time was 8cm H20.   I reviewed the chart and the last office note does not address his OSA or CPAP usage. I looked back in the [facility] chart and in 2013, Dr Dohmeier read the polysomnogram and made recommendations for CPAP therapy, then Dr Gaynell Face ordered the CPAP.

## 2017-04-07 NOTE — Telephone Encounter (Addendum)
I called [name] and was instructed to fax in order to repair or replace CPAP machine, and to ask family to bring in their machine. I called Mom and left a message for her about that. I faxed order to [facility] for new CPAP machine as requested. TG

## 2017-04-08 ENCOUNTER — Ambulatory Visit (INDEPENDENT_AMBULATORY_CARE_PROVIDER_SITE_OTHER): Payer: Medicaid Other | Admitting: Family

## 2017-04-08 ENCOUNTER — Encounter (INDEPENDENT_AMBULATORY_CARE_PROVIDER_SITE_OTHER): Payer: Self-pay | Admitting: Family

## 2017-04-08 VITALS — BP 110/70 | HR 80 | Ht 62.75 in | Wt 132.0 lb

## 2017-04-08 DIAGNOSIS — G40309 Generalized idiopathic epilepsy and epileptic syndromes, not intractable, without status epilepticus: Secondary | ICD-10-CM

## 2017-04-08 DIAGNOSIS — G4733 Obstructive sleep apnea (adult) (pediatric): Secondary | ICD-10-CM

## 2017-04-08 DIAGNOSIS — G40209 Localization-related (focal) (partial) symptomatic epilepsy and epileptic syndromes with complex partial seizures, not intractable, without status epilepticus: Secondary | ICD-10-CM

## 2017-04-08 DIAGNOSIS — E876 Hypokalemia: Secondary | ICD-10-CM | POA: Diagnosis not present

## 2017-04-08 DIAGNOSIS — G40109 Localization-related (focal) (partial) symptomatic epilepsy and epileptic syndromes with simple partial seizures, not intractable, without status epilepticus: Secondary | ICD-10-CM

## 2017-04-08 DIAGNOSIS — G40319 Generalized idiopathic epilepsy and epileptic syndromes, intractable, without status epilepticus: Secondary | ICD-10-CM | POA: Diagnosis not present

## 2017-04-08 DIAGNOSIS — G40901 Epilepsy, unspecified, not intractable, with status epilepticus: Secondary | ICD-10-CM

## 2017-04-08 DIAGNOSIS — G40119 Localization-related (focal) (partial) symptomatic epilepsy and epileptic syndromes with simple partial seizures, intractable, without status epilepticus: Secondary | ICD-10-CM | POA: Diagnosis not present

## 2017-04-08 NOTE — Patient Instructions (Signed)
Continue using your CPAP nightly as you have been doing.   Return to see Dr Gaynell Face in October as previously planned. Let me or Dr Gaynell Face know if your seizures become more frequent or more severe.

## 2017-04-08 NOTE — Progress Notes (Signed)
Patient: [name] MRN: [id number] Sex: male DOB: Jun 14, 1986  Provider: Rockwell Germany, NP Location of Care: [facility]  Note type: Routine return visit  History of Present Illness: Referral Source: Anastasia Pall MD History from: patient, [facility] chart and his mother Chief Complaint: Face to Face visit for CPAP  [name] is a 31 y.o. 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 [name] was last seen on December 24, 2016 but returns today for follow up for his obstructive sleep apnea.   [name] has a significant seizure disorder and is jointly managed by Dr Gaynell Face and Dr. Jeoffrey Massed at [facility].  He sees him about twice a year. [name] has an implanted vagal nerve stimulator, to help reduce the frequency of seizures as well as help abort seizures when they occur. [name] is taking and tolerating Carbamazepine, Keppra, Vimpat, [name] and Trokendi XR for his seizure disorder. His parents also have Phenobarbital and Lorazepam to give him through his Portacath for severe breakthrough seizures when needed.   [name] was diagnosed with obstructive sleep apnea in 2013 and began CPAP therapy at that time. He has been maintained on 8cm H20, which has worked well for him. [name] goes to bed at 9:30PM each night and awakens at 8:00AM each morning. He usually does not awaken during the night. Mom says that with the CPAP he sleeps quietly but without it he has exceedingly loud snoring with prolonged gasping respirations. [name] does not sleep well if he does not have CPAP,  is  restless, and tends to have more headaches when CPAP is not available to him. [name] usually naps during the day, likely a result of side effect of his many seizure medications, and uses the CPAP when he naps. His neck measured 19 inches today.   [name] has been fairly healthy otherwise since his last visit with Dr Gaynell Face. He tells me today that he is looking forward to an upcoming beach trip with his family. Neither [name] nor his mother have other health concerns for [name]  today other than previously mentioned.  Review of Systems: Please see the HPI for neurologic and other pertinent review of systems. Otherwise, the following systems are noncontributory including constitutional, eyes, ears, nose and throat, cardiovascular, respiratory, gastrointestinal, genitourinary, musculoskeletal, skin, endocrine, hematologic/lymph, allergic/immunologic and psychiatric.   Past Medical History:  Diagnosis Date  . ALLERGIC RHINITIS 12/26/2009  . Blood clot in vein   . Brown's syndrome   . Growth disorder   . Heart murmur    moderate MR  . Hyperlipidemia    No therapy  . Hypokalemia   . Mental retardation, mild (I.Q. 50-70)   . MITRAL VALVE PROLAPSE 06/22/2007  . SEIZURE DISORDER 06/22/2007  . Sleep apnea    cpap , last sleep study 10/01/11  . Unspecified hypothyroidism 06/22/2007   Hospitalizations: No., Head Injury: No., Nervous System Infections: No., Immunizations up to date: Yes.   Past Medical History Comments: Diagnosis of developmental delay as a toddler.   EEG 11/07/86 was normal for gestational age.   Genetics evaluation short stature, prenatal onset, chromosomal study: 27 XY, fragile X syndrome negative, evaluation for MELAS and MERRF  were negative.  Initial seizure 08/28/94 left locomotor with secondary generalization.   MRI of the brain 09/13/93: Scattered subcortical white matter lesions at the parietotemporal parieto-occipital junction is ischemic versus hamartomas.    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   Diagnosis attention deficit disorder inattentive type treated without success with Cylert April 1995  MRI brain 04/04/94 stable differential diagnosis hamartoma, vasculitis, ischemic, or embolic phenomenon.   Status epilepticus 01/31/95, and 04/15/95 Neurontin added to Tegretol   MRI brain 06/14/96 unchanged   Admission to [facility]. Tegretol 30 mcg for militate. 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   Topiramate started in May 1999 unable to be tolerated with Tegretol and was tapered and discontinued.  January 2000 EEG showed right greater than left mid temporal sharp waves was otherwise well-organized EEG. He hospitalizations in March, July, October, in November for recurrent seizures.  Patient placed on Depakote in April 2000 and developed gagging abdominal pain headache and had significant change in transaminases and liver functions. Topamax was restarted and Keppra was added.  Vimpat was started July 16, 2012 and adjusted upwards.   [name] was added on November 18, 2012 and has been adjusted upwards. Topamax was changed to Trokendi XR in attempt to deal with sleepiness, unsteadiness caused by polypharmacy. We to these medications have been introduced, the patient experiences somewhat improved seizure control however Is quite likely that despite better seizure control, the patient is experiencing impairment in cognition and gait as a result of polypharmacy .  MRI of the brain on March 21, 2013. The  patient has extensive calcification to the basal ganglia bilaterally, normal ventricle size, and no acute findings. EKG was unchanged  Birth History He was a breech presentation, cesarean section delivery. He had intrauterine growth retardation, and failure to thrive.  Behavior History Mild intellectual disability Surgical History Past Surgical History:  Procedure Laterality Date  . EYE SURGERY     X2 for Brown Syndrome  . IMPLANTATION VAGAL NERVE STIMULATOR     Left  . PORTACATH PLACEMENT     and removal 04/08/12    Family History family history includes Asthma in his father; Breast cancer in his mother; Congestive Heart Failure in his maternal grandfather; Coronary artery disease in his maternal grandfather and paternal grandfather; Diabetes in his father; Lung cancer in his paternal grandfather; Pneumonia in his maternal grandfather and paternal grandmother; Stroke in his paternal grandmother. Family History is otherwise negative for migraines, seizures, cognitive impairment, blindness, deafness, birth defects, chromosomal disorder, autism.  Social History Social History   Social History  . Marital status: Single    Spouse name: N/A  . Number of children: 0  . Years of education: N/A   Occupational History  . Disabled Unemployed   Social History Main Topics  . Smoking status: Never Smoker  . Smokeless tobacco: Never Used  . Alcohol use No  . Drug use: No  . Sexual activity: No   Other Topics Concern  . 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    He is currently not attending a  day program and is not employed.    He lives with his parents.    He enjoys helping around the house, Nascar, and football.    Allergies Allergies  Allergen Reactions  . Antihistamines, Chlorpheniramine-Type Other (See Comments)    Other Reaction: Dimetapp causes seizures  . Divalproex Sodium Other (See Comments)    Nausea, vomiting, liver and kidney  dysfunction  . Phenytoin Rash and Other (See Comments)    Dilantin causes more seizures.   . Valproic Acid And Related Other (See Comments)    Shuts down systems  . Vancomycin Rash, Other (See Comments) and Swelling    If given too fast, causes red man reaction Other Reaction: Redman's Syndrome    Physical Exam BP 110/70   Pulse 80   Ht 5' 2.75" (1.594 m)   Wt 132 lb (59.9 kg)   BMI 23.57 kg/m  General: alert, well developed, well nourished, in no acute distress, sandy hair, hazel eyes, right handed Head: microcephalic, beak-like nose, low-lying palate Ears, Nose and Throat: Otoscopic: tympanic membranes are occluded by wax; pharynx: oropharynx is pink without exudates or tonsillar hypertrophy.  On his head is turned with his right ear towards his right shoulder chin points to the left; his face and neck are asymmetrically swollen left greater than right Neck: supple, full range of motion, no cranial or cervical bruits Respiratory: auscultation clear Cardiovascular: no murmurs, pulses are normal Musculoskeletal: kyphosis, bilateral tight heel cords Skin: no rashes or neurocutaneous lesions  Neurologic Exam  Mental Status: alert; oriented to person; knowledge is below normal for age; language is below normal, but he is able to name objects follow commands, he has some problems with articulation and at times is hard to understand, particularly when he talks quickly Cranial Nerves: visual fields are full to double simultaneous stimuli; extraocular movements are full and dysconjugate, 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; pupils are round reactive to light; funduscopic examination shows bilateral positive red reflex he had some photophobia which made it difficult; symmetric facial strength; midline tongue and uvula; air conduction is greater than bone conduction bilaterally Motor: Normal functional strength, tone and mass; clumsy fine motor movements;  no pronator drift Sensory: intact responses to cold, vibration, proprioception and stereognosis Coordination: good finger-to-nose; rapid repetitive alternating movements and finger apposition are clumsy Gait and Station: Broad based antalgic gait Reflexes: symmetric and diminished bilaterally; no clonus; bilateral flexor plantar responses  Impression 1. 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 2. 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 3. 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 4. Hypokalemia, E87.6. 5. Obstructive sleep apnea, F47.33   Recommendations for plan of care The patient's previous [facility] records were reviewed. [name] has neither had nor required imaging or lab studies since the last visit. 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 [name] has had the same CPAP machine since 2013 and it is in need for repair or replacement. [name] has ongoing problems with obstructive sleep apnea and needs to continue on CPAP therapy at 8cm H20 for the foreseeable future. He will return to see Dr Gaynell Face in October for VNS  reprogramming and maintenance as previously planned.   The medication list was reviewed and reconciled.  No changes were made in the prescribed medications today.  A complete medication list was provided to the patient's mother.  Allergies as of 04/08/2017      Reactions   Antihistamines, Chlorpheniramine-type Other (See Comments)   Other Reaction: Dimetapp causes seizures   Divalproex Sodium Other (See Comments)   Nausea,  vomiting, liver and kidney dysfunction   Phenytoin Rash, Other (See Comments)   Dilantin causes more seizures.   Valproic Acid And Related Other (See Comments)   Shuts down systems   Vancomycin Rash, Other (See Comments), Swelling   If given too fast, causes red man reaction Other Reaction: Redman's Syndrome      Medication List       Accurate as of 04/08/17  4:21 PM. Always use your most recent med list.          acetaminophen 500 MG tablet Commonly known as:  TYLENOL Take 1,000 mg by mouth every 6 (six) hours as needed for headache.   albuterol 1.25 MG/3ML nebulizer solution Commonly known as:  ACCUNEB Take 3 mLs by nebulization every 6 (six) hours as needed for shortness of breath.   carbamazepine 100 MG chewable tablet Commonly known as:  TEGRETOL Chew 2 tablets in the morning, 2 tablets at midday and 1+1/2 tablets in the evening   cetirizine 10 MG tablet Commonly known as:  ZYRTEC Take 10 mg by mouth daily.   diazepam 2 MG tablet Commonly known as:  VALIUM Take 1 tablet every 6 - 8 hours as needed for anxiety or seizures   guaifenesin 400 MG Tabs tablet Commonly known as:  HUMIBID E Take 400 mg by mouth 2 (two) times daily.   K-PHOS 500 MG tablet Generic drug:  potassium phosphate (monobasic) TAKE 1 TABLET 4 TIMES DAILY   KEPPRA 500 MG tablet Generic drug:  levETIRAcetam Take 3 tablets (1,500 mg total) by mouth 2 (two) times daily.   lacosamide 50 MG Tabs tablet Commonly known as:  VIMPAT Take 2 in the AM then 2 at night   levothyroxine 25 MCG tablet Commonly known as:  SYNTHROID, LEVOTHROID Take 25 mcg by mouth daily before breakfast.   LORazepam 2 MG/ML concentrated solution Commonly known as:  ATIVAN Withdraw 74ml (2mg ) and 56ml  Normal saline into 3 ml syringe. Administer IV push over 3-5 minutes as needed for recurrent seizures   montelukast 10 MG tablet Commonly known as:  SINGULAIR Take 10 mg by mouth at bedtime.   multivitamin with minerals  Tabs tablet Take 1 tablet by mouth daily.   ondansetron 4 MG disintegrating tablet Commonly known as:  ZOFRAN-ODT Take 1 tablet as needed for nausea and vomiting   [name] 10 MG tablet Generic drug:  cloBAZam Take 1 tab in the morning, 1 tab in the afternoon, and 2 tabs at bedtime   PHENObarbital 65 MG/ML injection Commonly known as:  LUMINAL Withdraw 46ml (65mg ) Phenobarbital and 67ml Normal Saline into 62ml syringe. Adminster IV push for 3-5 minutes as needed for recurrent seizures   rosuvastatin 10 MG tablet Commonly known as:  CRESTOR Take 10 mg by mouth at bedtime.   Topiramate ER 200 MG Cp24 Commonly known as:  TROKENDI XR Take 1 capsule by mouth at bedtime   Topiramate ER 50 MG Cp24 Commonly known as:  TROKENDI XR Take 2 capsules by mouth at bedtime.       Dr. Gaynell Face was consulted regarding  the patient.   Total time spent with the patient was 30  minutes, of which 50% or more was spent in counseling and coordination of care.   Rockwell Germany NP-C

## 2017-04-08 NOTE — Telephone Encounter (Signed)
I called Mom and explained that [name] needed to be seen to document need for CPAP. She will bring him in today to see me at 1:45pm, arrival time 1:30pm. TG

## 2017-07-01 ENCOUNTER — Telehealth (INDEPENDENT_AMBULATORY_CARE_PROVIDER_SITE_OTHER): Payer: Self-pay | Admitting: Family

## 2017-07-02 NOTE — Telephone Encounter (Signed)
2 page fax received from Fabienne Bruns, RN at [facility], requesting Rockwell Germany, RN to please send signed/dated list of medications prescribed for patient.  Fax: ATTNSharee Pimple @ [email or web address]          (F) [phone] or [phone]   Fax has been labeled and placed in [place] office in her tray.

## 2017-07-02 NOTE — Telephone Encounter (Signed)
Correction on 2nd fax #: [phone]

## 2017-07-02 NOTE — Telephone Encounter (Signed)
I faxed the list as requested. TG

## 2017-07-16 ENCOUNTER — Other Ambulatory Visit (INDEPENDENT_AMBULATORY_CARE_PROVIDER_SITE_OTHER): Payer: Self-pay | Admitting: Pediatrics

## 2017-07-16 DIAGNOSIS — G40319 Generalized idiopathic epilepsy and epileptic syndromes, intractable, without status epilepticus: Secondary | ICD-10-CM

## 2017-08-07 ENCOUNTER — Other Ambulatory Visit: Payer: Self-pay | Admitting: Pediatrics

## 2017-08-11 ENCOUNTER — Other Ambulatory Visit: Payer: Self-pay | Admitting: Pediatrics

## 2017-08-11 DIAGNOSIS — G40319 Generalized idiopathic epilepsy and epileptic syndromes, intractable, without status epilepticus: Secondary | ICD-10-CM

## 2017-08-11 NOTE — Telephone Encounter (Signed)
Dr. Gaynell Face, this patient has not been seen since April. We have already given them the 30-day refill. How would you like to proceed?

## 2017-08-17 ENCOUNTER — Telehealth (INDEPENDENT_AMBULATORY_CARE_PROVIDER_SITE_OTHER): Payer: Self-pay | Admitting: Pediatrics

## 2017-08-17 NOTE — Telephone Encounter (Signed)
°  Who's calling (name and relationship to patient) : Tamela Oddi, mother Best contact number: [phone] Provider they see: Gaynell Face and Rockwell Germany Reason for call:  Mother left a message at 10:36am requesting to schedule appointments with Otila Kluver and Dr Gaynell Face. I returned the call, spoke to mother, and scheduled the appointments.     PRESCRIPTION REFILL ONLY  Name of prescription:  Pharmacy:

## 2017-08-18 ENCOUNTER — Encounter (HOSPITAL_COMMUNITY): Payer: Self-pay

## 2017-08-18 ENCOUNTER — Other Ambulatory Visit (INDEPENDENT_AMBULATORY_CARE_PROVIDER_SITE_OTHER): Payer: Self-pay | Admitting: Pediatrics

## 2017-08-18 ENCOUNTER — Other Ambulatory Visit: Payer: Self-pay

## 2017-08-18 ENCOUNTER — Emergency Department (HOSPITAL_COMMUNITY)
Admission: EM | Admit: 2017-08-18 | Discharge: 2017-08-18 | Disposition: A | Discharge status: Home / Self Care | Payer: Medicaid Other | Attending: Emergency Medicine | Admitting: Emergency Medicine

## 2017-08-18 DIAGNOSIS — G40319 Generalized idiopathic epilepsy and epileptic syndromes, intractable, without status epilepticus: Secondary | ICD-10-CM

## 2017-08-18 DIAGNOSIS — J020 Streptococcal pharyngitis: Secondary | ICD-10-CM | POA: Insufficient documentation

## 2017-08-18 DIAGNOSIS — R070 Pain in throat: Secondary | ICD-10-CM | POA: Diagnosis present

## 2017-08-18 DIAGNOSIS — R112 Nausea with vomiting, unspecified: Secondary | ICD-10-CM | POA: Insufficient documentation

## 2017-08-18 DIAGNOSIS — R509 Fever, unspecified: Secondary | ICD-10-CM | POA: Diagnosis not present

## 2017-08-18 DIAGNOSIS — E039 Hypothyroidism, unspecified: Secondary | ICD-10-CM | POA: Insufficient documentation

## 2017-08-18 DIAGNOSIS — R51 Headache: Secondary | ICD-10-CM | POA: Diagnosis not present

## 2017-08-18 DIAGNOSIS — Z79899 Other long term (current) drug therapy: Secondary | ICD-10-CM | POA: Diagnosis not present

## 2017-08-18 LAB — COMPREHENSIVE METABOLIC PANEL
ALT: 34 U/L (ref 17–63)
ANION GAP: 6 (ref 5–15)
AST: 37 U/L (ref 15–41)
Albumin: 3.1 g/dL — ABNORMAL LOW (ref 3.5–5.0)
Alkaline Phosphatase: 64 U/L (ref 38–126)
BUN: 10 mg/dL (ref 6–20)
CHLORIDE: 111 mmol/L (ref 101–111)
CO2: 20 mmol/L — AB (ref 22–32)
CREATININE: 1.06 mg/dL (ref 0.61–1.24)
Calcium: 7.8 mg/dL — ABNORMAL LOW (ref 8.9–10.3)
Glucose, Bld: 95 mg/dL (ref 65–99)
POTASSIUM: 3.4 mmol/L — AB (ref 3.5–5.1)
SODIUM: 137 mmol/L (ref 135–145)
Total Bilirubin: 0.3 mg/dL (ref 0.3–1.2)
Total Protein: 6.3 g/dL — ABNORMAL LOW (ref 6.5–8.1)

## 2017-08-18 LAB — CBC WITH DIFFERENTIAL/PLATELET
BASOS ABS: 0 10*3/uL (ref 0.0–0.1)
Basophils Relative: 0 %
EOS ABS: 0 10*3/uL (ref 0.0–0.7)
EOS PCT: 0 %
HCT: 35.6 % — ABNORMAL LOW (ref 39.0–52.0)
Hemoglobin: 11.3 g/dL — ABNORMAL LOW (ref 13.0–17.0)
LYMPHS PCT: 55 %
Lymphs Abs: 1.6 10*3/uL (ref 0.7–4.0)
MCH: 26.2 pg (ref 26.0–34.0)
MCHC: 31.7 g/dL (ref 30.0–36.0)
MCV: 82.4 fL (ref 78.0–100.0)
MONO ABS: 0.3 10*3/uL (ref 0.1–1.0)
Monocytes Relative: 10 %
Neutro Abs: 1 10*3/uL — ABNORMAL LOW (ref 1.7–7.7)
Neutrophils Relative %: 35 %
PLATELETS: 156 10*3/uL (ref 150–400)
RBC: 4.32 MIL/uL (ref 4.22–5.81)
RDW: 15.4 % (ref 11.5–15.5)
WBC: 2.9 10*3/uL — AB (ref 4.0–10.5)

## 2017-08-18 LAB — CARBAMAZEPINE LEVEL, TOTAL: Carbamazepine Lvl: 9 ug/mL (ref 4.0–12.0)

## 2017-08-18 LAB — I-STAT CG4 LACTIC ACID, ED: Lactic Acid, Venous: 0.94 mmol/L (ref 0.5–1.9)

## 2017-08-18 LAB — LIPASE, BLOOD: LIPASE: 21 U/L (ref 11–51)

## 2017-08-18 MED ORDER — PROMETHAZINE HCL 25 MG RE SUPP: 25.0000 mg | Freq: Four times a day (QID) | RECTAL | 0 refills | Status: DC | PRN

## 2017-08-18 MED ORDER — ONDANSETRON HCL 4 MG/2ML IJ SOLN
4.0000 mg | Freq: Once | INTRAMUSCULAR | Status: AC
  Administered 2017-08-18: 4 mg via INTRAVENOUS
  Filled 2017-08-18: qty 2

## 2017-08-18 MED ORDER — PENICILLIN G BENZATHINE 1200000 UNIT/2ML IM SUSP
1.2000 10*6.[IU] | Freq: Once | INTRAMUSCULAR | Status: AC
  Administered 2017-08-18: 1.2 10*6.[IU] via INTRAMUSCULAR
  Filled 2017-08-18: qty 2

## 2017-08-18 MED ORDER — SODIUM CHLORIDE 0.9 % IV BOLUS (SEPSIS)
1000.0000 mL | Freq: Once | INTRAVENOUS | Status: AC
  Administered 2017-08-18: 1000 mL via INTRAVENOUS

## 2017-08-18 NOTE — ED Notes (Signed)
Rn attempted to draw blood from pt, unsuccessful. Asked phlebotomy to draw.

## 2017-08-18 NOTE — ED Provider Notes (Signed)
[facility] Provider Note   CSN: [id number] Arrival date & time: 08/18/17  1458     History   Chief Complaint Chief Complaint  Patient presents with  . Weakness  . Emesis    HPI [name] is a 31 y.o. male.  31yo M w/ PMH including MR, Brown's syndrome, HLD, OSA, seizure d/o who p/w seizures, sore throat, and vomiting.  4 days ago, the patient had 2 episodes of seizure witnessed by father at home.  Father states it is not uncommon for him to have seizures 1 or 2 times a week.  For the past few days he has been complaining of a sore throat associated with fevers up to 101.2 this morning.  He last had Tylenol at 11 AM.  He has had intermittent migraine headaches which are often associated with nausea and vomiting.  He was able to keep some food down last night but today has not been able to keep anything down including his medications.  No associated cough or diarrhea.  He saw his PCP today and was sent here due to concerns for dehydration and vomiting.  He did test positive for strep at the clinic.  Father states that the patient's mom has not been feeling well recently but no other known sick contacts.  No other medications aside from Tylenol today.  LEVEL 5 CAVEAT DUE TO MENTAL RETARDATION   The history is provided by a parent and medical records.  Weakness  Associated symptoms include vomiting.  Emesis      Past Medical History:  Diagnosis Date  . ALLERGIC RHINITIS 12/26/2009  . Blood clot in vein   . Brown's syndrome   . Growth disorder   . Heart murmur    moderate MR  . Hyperlipidemia    No therapy  . Hypokalemia   . Mental retardation, mild (I.Q. 50-70)   . MITRAL VALVE PROLAPSE 06/22/2007  . SEIZURE DISORDER 06/22/2007  . Sleep apnea    cpap , last sleep study 10/01/11  . Unspecified hypothyroidism 06/22/2007    Patient Active Problem List   Diagnosis Date Noted  . CAP (community acquired pneumonia) 06/12/2016    . Anemia 06/12/2016  . 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 Encounter for feeding tube placement   . Cardiac arrest ([place]) 11/03/2015  . Acute respiratory failure ([place])   . Acute respiratory failure with hypercapnia ([place])   . Impaired oral gastric feeding tube   . Respiratory arrest ([place]) 09/29/2015  . Seizure ([place]) 09/29/2015  . 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 Encounter for therapeutic drug monitoring 10/24/2013  . Hypokalemia 08/03/2013  . Hypothyroidism 08/02/2013  . Subtherapeutic international normalized ratio (INR) 08/02/2013  . Contracture of ankle and foot joint 03/29/2013  . Abnormality of gait 12/21/2012  . 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 Partial epilepsy with intractable epilepsy ([place]) 12/21/2012  . Obstructive sleep apnea 12/21/2012  . Acute venous embolism and thrombosis of subclavian veins ([place]) 12/21/2012  . Acute venous embolism and thrombosis of internal jugular veins ([place]) 12/21/2012  . 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 Loss of weight 12/21/2012  . Encounter for long-term (current) use of other medications 12/21/2012  . Screening for lipoid disorders 12/21/2012  . Fall resulting in striking against other object 12/21/2012  . Mechanical complication of other vascular device, implant, and graft 12/21/2012  . 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 Pain in  soft tissues of limb 11/01/2012  . Long term (current) use of anticoagulants 05/21/2012  . Aspiration pneumonia ([place]) 05/17/2012  . Diarrhea 05/17/2012  . [name] 05/17/2012  . Mitral regurgitation 04/29/2012  . A-fib ([place]) 04/08/2012  . Status epilepticus ([place]) 04/08/2012  . Generalized convulsive epilepsy ([place]) 01/08/2012    Class: Acute  . 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 Partial epilepsy with impairment of  consciousness ([place]) 01/08/2012    Class: Chronic  . Irregular heart beat   . DVT of upper extremity (deep vein thrombosis) ([place]) 09/04/2011  . 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 Allergic rhinitis 12/26/2009  . MITRAL VALVE PROLAPSE 06/22/2007  . Seizure disorder ([place]) 06/22/2007    Past Surgical History:  Procedure Laterality Date  . EYE SURGERY     X2 for Brown Syndrome  . IMPLANTATION VAGAL NERVE STIMULATOR     Left  . PORTACATH PLACEMENT     and removal 04/08/12       Home Medications    Prior to Admission medications   Medication Sig Start Date End Date Taking? Authorizing Provider  acetaminophen (TYLENOL) 500 MG tablet Take 1,000 mg by mouth every 6 (six) hours as needed for headache.   Yes [provider]  carbamazepine (TEGRETOL) 100 MG chewable tablet Chew 2 tablets in the morning, 2 tablets at midday and 1+1/2 tablets in the evening Patient taking differently: Chew 150-200 mg by mouth See admin instructions. Chew 200mg  in the morning, 200mg  at midday and 150mg  in the evening 12/24/16  Yes Hickling, Princess Bruins, MD  cetirizine (ZYRTEC) 10 MG tablet Take 10 mg by mouth daily. 02/12/15  Yes [provider]  diazepam (VALIUM) 2 MG tablet Take 1 tablet every 6 - 8 hours as needed for anxiety or seizures Patient taking differently: Take 2 mg by mouth every 6 (six) hours as needed for anxiety (seizures).  12/24/16  Yes Jodi Geralds, MD  guaifenesin (HUMIBID E) 400 MG TABS tablet Take 400 mg by mouth 2 (two) times daily.   Yes [provider]  K-PHOS 500 MG tablet TAKE 1 TABLET BY MOUTH FOUR TIMES A DAY Patient taking differently: TAKE 1 TABLET (500mg ) BY MOUTH FOUR TIMES A DAY 08/10/17  Yes Hickling, Princess Bruins, MD  KEPPRA 500 MG tablet Take 3 tablets (1,500 mg total) by mouth 2 (two) times daily. Patient taking differently: Take 500 mg by mouth 2 (two) times daily.  01/16/17  Yes Rockwell Germany, NP  lacosamide (VIMPAT) 50 MG TABS tablet Take 2 in the AM  then 2 at night Patient taking differently: Take 100 mg by mouth 2 (two) times daily.  03/26/17  Yes Jodi Geralds, MD  levothyroxine (SYNTHROID, LEVOTHROID) 75 MCG tablet Take 75 mcg by mouth daily before breakfast.    Yes [provider]  LORazepam (ATIVAN) 2 MG/ML concentrated solution Withdraw 11ml (2mg ) and 33ml  Normal saline into 3 ml syringe. Administer IV push over 3-5 minutes as needed for recurrent seizures 11/20/16  Yes Rockwell Germany, NP  Multiple Vitamin (MULTIVITAMIN WITH MINERALS) TABS Take 1 tablet by mouth daily.   Yes [provider]  ondansetron (ZOFRAN-ODT) 4 MG disintegrating tablet Take 1 tablet as needed for nausea and vomiting 09/27/15  Yes Hickling, Princess Bruins, MD  [name] 10 MG tablet TAKE 1 TABLET BY MOUTH EVERY MORNING AND THE AFTERNOON AND 2 TABLETS AT BEDTIME Patient taking differently: TAKE 1 TABLET(10mg ) BY MOUTH EVERY MORNING  AND 10MG  IN THE AFTERNOON THEN 2 TABLETS(20mg ) AT BEDTIME 08/18/17  Yes Hickling, Princess Bruins, MD  PHENObarbital (LUMINAL) 65 MG/ML injection Withdraw 47ml (65mg ) Phenobarbital and 58ml Normal Saline into 19ml syringe. [place] IV push for 3-5 minutes as needed for recurrent seizures 11/20/16  Yes Goodpasture, Otila Kluver, NP  rosuvastatin (CRESTOR) 10 MG tablet Take 10 mg by mouth at bedtime. 05/26/16  Yes [provider]  Topiramate ER (TROKENDI XR) 200 MG CP24 Take 1 capsule by mouth at bedtime Patient taking differently: Take 200 mg by mouth at bedtime. Take with 50mg  tab to equal 250mg (total) 12/24/16  Yes Hickling, Princess Bruins, MD  Topiramate ER (TROKENDI XR) 50 MG CP24 Take 2 capsules by mouth at bedtime. Patient taking differently: Take 50 mg by mouth at bedtime. Take with 200mg  tab to equal 250mg (total) 12/24/16  Yes Hickling, Princess Bruins, MD  promethazine (PHENERGAN) 25 MG suppository Place 1 suppository (25 mg total) rectally every 6 (six) hours as needed for nausea or vomiting. 08/18/17   Savva Beamer, Wenda Overland, MD    Family  History Family History  Problem Relation Age of Onset  . Breast cancer Mother   . Asthma Father   . Diabetes Father   . Coronary artery disease Maternal Grandfather   . Congestive Heart Failure Maternal Grandfather        Died at 48  . Pneumonia Maternal Grandfather   . Coronary artery disease Paternal Grandfather   . Lung cancer Paternal Grandfather        Died at 62  . Pneumonia Paternal Grandmother        Died at 79  . Stroke Paternal Grandmother     Social History Social History   Tobacco Use  . Smoking status: Never Smoker  . Smokeless tobacco: Never Used  Substance Use Topics  . Alcohol use: No    Alcohol/week: 0.0 oz  . Drug use: No     Allergies   Antihistamines, chlorpheniramine-type; Divalproex sodium; Phenytoin; Valproic acid and related; and Vancomycin   Review of Systems Review of Systems  Unable to perform ROS: Other  Gastrointestinal: Positive for vomiting.  Neurological: Positive for weakness.  mental retardation   Physical Exam Updated Vital Signs BP 104/67   Pulse 78   Temp 98.7 F (37.1 C) (Oral)   Resp (!) 24   Wt 54.9 kg (121 lb)   SpO2 99%   BMI 21.61 kg/m   Physical Exam  Constitutional: He is oriented to person, place, and time. He appears well-developed and well-nourished. No distress.  HENT:  Head: Normocephalic.  Mild erythema posterior oropharynx, no tonsillar asymmetry, uvula midline Faint ecchymosis lateral L upper eyelid  Eyes: Conjunctivae are normal. Pupils are equal, round, and reactive to light.  Neck: Neck supple.  Cardiovascular: Normal rate and regular rhythm.  Murmur heard. Pulmonary/Chest: Effort normal and breath sounds normal.  Abdominal: Soft. Bowel sounds are normal. He exhibits no distension. There is no tenderness.  Musculoskeletal: He exhibits no edema.  Lymphadenopathy:    He has no cervical adenopathy.  Neurological: He is alert and oriented to person, place, and time.  Following basic commands   Skin: Skin is warm and dry.  Port in R upper chest  Nursing note and vitals reviewed.    ED Treatments / Results  Labs (all labs ordered are listed, but only abnormal results are displayed) Labs Reviewed  COMPREHENSIVE METABOLIC PANEL - Abnormal; Notable for the following components:      Result Value   Potassium  3.4 (*)    CO2 20 (*)    Calcium 7.8 (*)    Total Protein 6.3 (*)    Albumin 3.1 (*)    All other components within normal limits  CBC WITH DIFFERENTIAL/PLATELET - Abnormal; Notable for the following components:   WBC 2.9 (*)    Hemoglobin 11.3 (*)    HCT 35.6 (*)    Neutro Abs 1.0 (*)    All other components within normal limits  LIPASE, BLOOD  CARBAMAZEPINE LEVEL, TOTAL  URINALYSIS, ROUTINE W REFLEX MICROSCOPIC  I-STAT CG4 LACTIC ACID, ED  I-STAT CG4 LACTIC ACID, ED    EKG  EKG Interpretation None       Radiology No results found.  Procedures Procedures (including critical care time)  Medications Ordered in ED Medications  sodium chloride 0.9 % bolus 1,000 mL (0 mLs Intravenous Stopped 08/18/17 1754)  penicillin g benzathine (BICILLIN LA) 1200000 UNIT/2ML injection 1.2 Million Units (1.2 Million Units Intramuscular Given 08/18/17 1750)  ondansetron (ZOFRAN) injection 4 mg (4 mg Intravenous Given 08/18/17 1747)     Initial Impression / Assessment and Plan / ED Course  I have reviewed the triage vital signs and the nursing notes.  Pertinent labs & imaging results that were available during my care of the patient were reviewed by me and considered in my medical decision making (see chart for details).    Pt w/ several days of vomiting and seizures a few days ago in the setting of sore throat and intermittent fevers.  Tested positive for strep at PCP office, sent here due to concerns for dehydration and ongoing vomiting.  He was nontoxic on my exam, breathing comfortably on room air, normal vital signs.  No abdominal tenderness.  No concerning  findings on HEENT exam.  Gave penicillin to treat for strep as well as zofran and IVF bolus.   Labs overall reassuring with normal lactate, reassuring CMP, WBC 2.9, normal lipase.  Carbamazepine level was within normal limits.  After receiving the above medications the patient was able to tolerate liquids as well as his home medications.  He remained well-appearing on reexamination and denied any complaints.  Family feels comfortable managing his symptoms at home and they understand return precautions.  Patient discharged in satisfactory condition.  Final Clinical Impressions(s) / ED Diagnoses   Final diagnoses:  Strep pharyngitis  Non-intractable vomiting with nausea, unspecified vomiting type    ED Discharge Orders        Ordered    promethazine (PHENERGAN) 25 MG suppository  Every 6 hours PRN     08/18/17 1926       Tighe Gitto, Wenda Overland, MD 08/18/17 1929

## 2017-08-18 NOTE — ED Triage Notes (Addendum)
[facility] arrives from [facility]. Pt had 2 seizures on Friday and has experienced n/v and lethargy since then. Pt has hx of seizures and takes multiple medications. PT has Browns syndrome as well. PT has not been able to keep any medications down. PT father at bedside with him. Family report fever at home and [facility] office reports "borderline positive strep"

## 2017-09-09 ENCOUNTER — Other Ambulatory Visit: Payer: Self-pay

## 2017-09-09 ENCOUNTER — Ambulatory Visit (INDEPENDENT_AMBULATORY_CARE_PROVIDER_SITE_OTHER): Payer: Medicaid Other | Admitting: Family

## 2017-09-09 ENCOUNTER — Encounter (INDEPENDENT_AMBULATORY_CARE_PROVIDER_SITE_OTHER): Payer: Self-pay | Admitting: Pediatrics

## 2017-09-09 ENCOUNTER — Encounter (INDEPENDENT_AMBULATORY_CARE_PROVIDER_SITE_OTHER): Payer: Self-pay | Admitting: Family

## 2017-09-09 ENCOUNTER — Ambulatory Visit (INDEPENDENT_AMBULATORY_CARE_PROVIDER_SITE_OTHER): Payer: Medicaid Other | Admitting: Pediatrics

## 2017-09-09 VITALS — BP 120/90 | HR 80 | Ht 63.25 in | Wt 126.6 lb

## 2017-09-09 DIAGNOSIS — G40209 Localization-related (focal) (partial) symptomatic epilepsy and epileptic syndromes with complex partial seizures, not intractable, without status epilepticus: Secondary | ICD-10-CM

## 2017-09-09 DIAGNOSIS — G40319 Generalized idiopathic epilepsy and epileptic syndromes, intractable, without status epilepticus: Secondary | ICD-10-CM

## 2017-09-09 DIAGNOSIS — I340 Nonrheumatic mitral (valve) insufficiency: Secondary | ICD-10-CM | POA: Diagnosis not present

## 2017-09-09 DIAGNOSIS — G4733 Obstructive sleep apnea (adult) (pediatric): Secondary | ICD-10-CM

## 2017-09-09 DIAGNOSIS — H5060 Mechanical strabismus, unspecified: Secondary | ICD-10-CM

## 2017-09-09 DIAGNOSIS — G40309 Generalized idiopathic epilepsy and epileptic syndromes, not intractable, without status epilepticus: Secondary | ICD-10-CM

## 2017-09-09 DIAGNOSIS — G40109 Localization-related (focal) (partial) symptomatic epilepsy and epileptic syndromes with simple partial seizures, not intractable, without status epilepticus: Secondary | ICD-10-CM

## 2017-09-09 DIAGNOSIS — R2689 Other abnormalities of gait and mobility: Secondary | ICD-10-CM

## 2017-09-09 DIAGNOSIS — G40119 Localization-related (focal) (partial) symptomatic epilepsy and epileptic syndromes with simple partial seizures, intractable, without status epilepticus: Secondary | ICD-10-CM | POA: Diagnosis not present

## 2017-09-09 DIAGNOSIS — R269 Unspecified abnormalities of gait and mobility: Secondary | ICD-10-CM | POA: Diagnosis not present

## 2017-09-09 DIAGNOSIS — F70 Mild intellectual disabilities: Secondary | ICD-10-CM

## 2017-09-09 NOTE — Progress Notes (Signed)
Patient: [name] MRN: [id number] Sex: male DOB: 1986-01-12  Provider: Rockwell Germany, NP Location of Care: [facility]  Note type: Routine return visit  History of Present Illness: Referral Source: Anastasia Pall, MD History from: mother, patient and [facility] chart Chief Complaint: Face to Face visit for CPAP  [name] is a 31 y.o. man with history of intractable epilepsy with secondary generalization, postictal and non-ictal migraines, early onset cataracts, intellectual disability, mitral regurgitation, calcifications in his basal ganglia noted on MRI scan, and obstructive sleep apnea. 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 He has a significant seizure disorder and is followed by Dr Gaynell Face and Dr Jeoffrey Massed at [facility]. He has an implanted vagal nerve stimulator that helps to reduce seizure frequency as well as help abort seizures when they occur. [name] is taking and tolerating Carbamazepine, Keppra, Vimpat, [name] and Trokendi XR for his seizure disorder. His parents give him Phenobarbital and Lorazepam through the portacath when he has a severe seizure. He also requires blow by oxygen when a seizure occurs.   [name] was diagnosed with obstructive sleep apnea in 2013, and began CPAP therapy at that time. He has been maintained on 8cm H2O, which has worked well for him. [name] goes to bed each night at 9:30PM and awakens at 8:00AM. With the CPAP he usually sleeps restfully but without it he has very loud snoring and prolonged gasping respirations. He also uses CPAP when he naps during the day, such as after a seizure or if he has a migraine headache. Mom said that his current mask needs to be replaced but that otherwise things are going well.   [name] has been otherwise healthy since he was last seen. He had a surprise 31th  birthday party last week and had a brief seizure that required O2 blow by after the party was over. He has been approved to receive a seizure therapy dog and has been going through the process for a dog to be assigned to him. Mom has no other health concerns for [name] today other than previously mentioned.   Review of Systems: Please see the HPI for neurologic and other pertinent review of systems. Otherwise, all other systems were reviewed and were negative.    Past Medical History:  Diagnosis Date  . ALLERGIC RHINITIS 12/26/2009  . Blood clot in vein   . Brown's syndrome   . Growth disorder   . Heart murmur    moderate MR  . Hyperlipidemia    No therapy  . Hypokalemia   . Mental retardation, mild (I.Q. 50-70)   . MITRAL VALVE PROLAPSE 06/22/2007  . SEIZURE DISORDER 06/22/2007  . Sleep apnea    cpap , last sleep study 10/01/11  . Unspecified hypothyroidism 06/22/2007   Hospitalizations: No., Head Injury: No., Nervous System Infections: No., Immunizations up to date: Yes.   Past Medical History Comments: Diagnosis of developmental delay as a toddler.   EEG 11/07/86 was normal for gestational age.   Genetics evaluation short stature, prenatal onset, chromosomal study: 59 XY, fragile X syndrome negative, evaluation for MELAS and MERRF were negative.  Initial seizure 08/28/94 left locomotor with secondary generalization.   MRI of the brain 09/13/93: Scattered subcortical white matter lesions at the parietotemporal parieto-occipital junction is ischemic versus hamartomas.   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   Diagnosis attention deficit disorder inattentive type treated without success with Cylert April 1995  MRI brain 04/04/94 stable differential diagnosis hamartoma, vasculitis, ischemic, or embolic phenomenon.   Status epilepticus 01/31/95, and 04/15/95 Neurontin added to Tegretol   MRI brain 06/14/96  unchanged   Admission to [facility]. Tegretol 30 mcg for militate. 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   Topiramate started in May 1999 unable to be tolerated with Tegretol and was tapered and discontinued.  January 2000 EEG showed right greater than left mid temporal sharp waves was otherwise well-organized EEG. He hospitalizations in March, July, October, in November for recurrent seizures.  Patient placed on Depakote in April 2000 and developed gagging abdominal pain headache and had significant change in transaminases and liver functions. Topamax was restarted and Keppra was added.  Vimpat was started July 16, 2012 and adjusted upwards.   [name] was added on November 18, 2012 and has been adjusted upwards. Topamax was changed to Trokendi XR in attempt to deal with sleepiness, unsteadiness caused by polypharmacy. We to these medications have been introduced, the patient experiences somewhat improved seizure control however Is quite likely that despite better seizure control, the patient is experiencing impairment in cognition and gait as a result of polypharmacy .  MRI of the brain on March 21, 2013. The patient has extensive calcification to the basal ganglia bilaterally, normal ventricle size, and no acute findings. EKG was unchanged  Birth History He was a breech presentation, cesarean section delivery. He had intrauterine growth retardation, and failure to thrive.  Behavior History Mild intellectual disability   Surgical History Past Surgical History:  Procedure Laterality Date  . EYE SURGERY     X2 for  Brown Syndrome  . IMPLANTATION VAGAL NERVE STIMULATOR     Left  . PORTACATH PLACEMENT     and removal 04/08/12    Family History family history includes Asthma in his father; Breast cancer in his mother; Congestive Heart Failure in his maternal grandfather; Coronary artery disease in his maternal grandfather and paternal grandfather; Diabetes in his father; Lung cancer in his paternal grandfather; Pneumonia in his maternal grandfather and paternal grandmother; Stroke in his paternal grandmother. Family History is otherwise negative for migraines, seizures, cognitive impairment, blindness, deafness, birth defects, chromosomal disorder, autism.  Social History Social History   Socioeconomic History  . Marital status: Single    Spouse name: Not on file  . Number of children: 0  . Years of education: Not on file  . Highest education level: Not on file  Social Needs  . Financial resource strain: Not on file  . Food insecurity - worry: Not on file  . Food insecurity - inability: Not on file  . Transportation needs - medical: Not on file  . Transportation needs - non-medical: Not on file  Occupational History  . Occupation: Disabled    Employer: UNEMPLOYED  Tobacco Use  . Smoking status: Never Smoker  . Smokeless tobacco: Never Used  Substance and Sexual Activity  . Alcohol use: No    Alcohol/week: 0.0 oz  . Drug use: No  . Sexual activity: No  Other Topics Concern  .  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    He lives with his parents.    He enjoys helping around the house, Nascar, and football.    Allergies Allergies  Allergen Reactions  . Antihistamines, Chlorpheniramine-Type Other (See Comments)    Other Reaction: Dimetapp causes seizures  . Divalproex Sodium Other (See Comments)    Nausea, vomiting, liver and kidney dysfunction  . Phenytoin Rash and Other (See  Comments)    Dilantin causes more seizures.   . Valproic Acid And Related Other (See Comments)    Shuts down systems  . Vancomycin Rash, Other (See Comments) and Swelling    If given too fast, causes red man reaction Other Reaction: Redman's Syndrome    Physical Exam BP 120/90   Pulse 80   Ht 5' 3.25" (1.607 m)   Wt 126 lb 9.6 oz (57.4 kg)   BMI 22.25 kg/m  General: Small statured but well nourished male, seated in exam room, in no evident distress; sandy hair, hazel eyes, right handed.  Head: Microcephalic and atraumatic. Has beak-like nose, low-lying palate, oropharynx benign other than bilateral tympanics occluded by ear wax. His head is turned to the right with his right ear on his right shoulder, and chin points to the left. His face and neck are symmetrically swollen left greater than right. Neck: supple with no carotid bruits. No focal tenderness. Cardiovascular: regular rate and rhythm, no murmurs. Respiratory: Clear to auscultation bilaterally Abdomen: Bowel sounds present all four quadrants, abdomen soft, non-tender, non-distended. No hepatosplenomegaly or masses palpated. Musculoskeletal: No skeletal deformities. Has kyphosis and bilateral tight heel cords. Skin: no rashes or neurocutaneous lesions  Neurologic Exam Mental Status: Awake and fully alert.  Attention span, concentration, and fund of knowledge subnormal for age. Speech with mild dysarthria, language is below normal for age. Able to follow simple commands and participate in examination. Cranial Nerves: Fundoscopic exam - red reflex present.  Unable to fully visualize fundus.  Pupils equal briskly reactive to light.  Extraocular movements full and dysconjugate.  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  Hearing intact and symmetric to whisper.  Facial sensation intact.  Face, tongue, palate move normally and symmetrically.  Neck flexion and  extension normal. Motor: Functional bulk, tone and strength in all tested extremity muscles, clumsy fine motor movements, no pronator drift Sensory: Intact to touch and temperature in all extremities. Coordination: Good finger to nose, repetitive alternating movements and finger opposition is clumsy Gait and Station: Broad based antalgic gait Reflexes: Diminished and symmetric. Toes downgoing. No clonus.  Impression 1.  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  Partial epilepsy with intractable epilepsy, F40.119 3.  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   Obstructive sleep apnea, F47.33   Recommendations for plan of care The patient's previous [facility] records were reviewed. Akaash has neither had nor required imaging or lab studies since the last visit. He is a 31 year old man with history of intractable epilepsy, postictal and non-ictal migraines, early onset cataracts, intellectual disability, mitral regurgitation, calcifications in his basal ganglia noted on MRI scan, and obstructive sleep apnea. 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 [name]  uses CPAP therapy for his sleep apnea and is doing well on his current settings. He is taking and tolerating Carbamazepine, Keppra, Vimpat, [name] and Trokendi XR for his seizure disorder. He has an appointment later today with Dr Gaynell Face for VNS reprogramming. [name] will continue with his medications and curent CPAP therapy without change for now. I will see him back in follow up in 6 months or sooner if needed. Mom agreed with the plans made today.  The medication list was reviewed and reconciled.  No changes were made in the prescribed medications today.  A complete medication list was provided to the patient's mother.  Allergies as of 09/09/2017       Reactions   Antihistamines, Chlorpheniramine-type Other (See Comments)   Other Reaction: Dimetapp causes seizures   Divalproex Sodium Other (See Comments)   Nausea, vomiting, liver and kidney dysfunction   Phenytoin Rash, Other (See Comments)   Dilantin causes more seizures.   Valproic Acid And Related Other (See Comments)   Shuts down systems   Vancomycin Rash, Other (See Comments), Swelling   If given too fast, causes red man reaction Other Reaction: Redman's Syndrome      Medication List        Accurate as of 09/09/17 10:29 AM. Always use your most recent med list.          acetaminophen 500 MG tablet Commonly known as:  TYLENOL Take 1,000 mg by mouth every 6 (six) hours as needed for headache.   carbamazepine 100 MG chewable tablet Commonly known as:  TEGRETOL Chew 2 tablets in the morning, 2 tablets at midday and 1+1/2 tablets in the evening   cetirizine 10 MG tablet Commonly known as:  ZYRTEC Take 10 mg by mouth daily.   diazepam 2 MG tablet Commonly known as:  VALIUM Take 1 tablet every 6 - 8 hours as needed for anxiety or seizures   guaifenesin 400 MG Tabs tablet Commonly known as:  HUMIBID E Take 400 mg by mouth 2 (two) times daily.   K-PHOS 500 MG tablet Generic drug:  potassium phosphate (monobasic) TAKE 1 TABLET BY MOUTH FOUR TIMES A DAY   KEPPRA 500 MG tablet Generic drug:  levETIRAcetam Take 3 tablets (1,500 mg total) by mouth 2 (two) times daily.   lacosamide 50 MG Tabs tablet Commonly known as:  VIMPAT Take 2 in the AM then 2 at night   levothyroxine 75 MCG tablet Commonly known as:  SYNTHROID, LEVOTHROID Take 75 mcg by mouth daily before breakfast.   LORazepam 2 MG/ML concentrated solution Commonly known as:  ATIVAN Withdraw 49ml (2mg ) and 67ml  Normal saline into 3 ml syringe. Administer IV push over 3-5 minutes as needed for recurrent seizures   multivitamin with minerals Tabs tablet Take 1 tablet by mouth daily.   ondansetron 4 MG  disintegrating tablet Commonly known as:  ZOFRAN-ODT Take 1 tablet as needed for nausea and vomiting   [name] 10 MG tablet Generic drug:  cloBAZam TAKE 1 TABLET BY MOUTH EVERY MORNING AND THE AFTERNOON AND 2 TABLETS AT BEDTIME   PHENObarbital 65 MG/ML injection Commonly known as:  LUMINAL Withdraw 95ml (65mg ) Phenobarbital and 71ml Normal Saline into 54ml syringe. Adminster IV push for 3-5 minutes as needed for recurrent seizures   promethazine 25 MG suppository Commonly known as:  PHENERGAN Place 1 suppository (25 mg total) rectally every 6 (six) hours as needed for nausea or vomiting.   rosuvastatin 10 MG tablet Commonly known as:  CRESTOR Take  10 mg by mouth at bedtime.   Topiramate ER 200 MG Cp24 Commonly known as:  TROKENDI XR Take 1 capsule by mouth at bedtime   Topiramate ER 50 MG Cp24 Commonly known as:  TROKENDI XR Take 2 capsules by mouth at bedtime.       Dr. Gaynell Face was consulted regarding the patient.   Total time spent with the patient was 25 minutes, of which 50% or more was spent in counseling and coordination of care.   Rockwell Germany NP-C

## 2017-09-09 NOTE — Patient Instructions (Signed)
Thank you for coming in today.   Instructions for you until your next appointment are as follows: 1. Continue your medications as you have been taking them.  2. I will send in a refill for the medications given by port-a-cath to [place].  3. Let me know if your seizures become more frequent or more severe, or if you have any other questions or concerns.  4. Please sign up for MyChart if you have not done so 5. Please plan to return for follow up in 6 months or sooner if needed. This can be on the same day as a VNS reprogramming by Dr Gaynell Face

## 2017-09-09 NOTE — Progress Notes (Signed)
Patient: [name] MRN: [id number] Sex: male DOB: [birth year]  Provider: Wyline Copas, MD Location of Care: [facility]  Note type: Routine return visit  History of Present Illness: Referral Source: Anastasia Pall, MD History from: mother, patient and [facility] chart Chief Complaint: VNS/Seizures  [name] is a 31 y.o. male who was evaluated on September 09, 2017 for the first time since December 24, 2016.  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  He is managed currently with Dr. Jeoffrey Massed at [facility].  Over time, we have achieved fairly good control of his seizures with the combination of multiple antiepileptic medications and vagal nerve stimulator.  He has had 3 seizures this month.  He had one emergency department evaluation for persistent migraine, dehydration, and vomiting, which was treated with a migraine cocktail.  Overall, however, his health was good.  One of his seizures occurred following his 45th birthday party, which I attended.  We anticipated that the excitement of the event might trigger a seizure and it did.  Fortunately, it self-resolved.  I interrogated his vagal nerve stimulator today, which is recorded below.  Procedure: Interrogation and reprogramming of Vagal Nerve Stimulator  Implantation of the current stimulator 104 serial # [id number] implanted 04/19/14   Interrogation of the vagal nerve stimulator: Voltage 2.25 mA. Frequency 20 Hz, pulse width 250 microseconds, signal time on 7 seconds, signal time off 1.1 minutes,  magnet current 2.50 mA, magnet signal time on 60 seconds, magnet pulse width 250 microseconds. I reprogrammed the computer for diagnostics.  The normal mode diagnostics using his current settings revealed the battery is nearly full, and communication, output, and impedance are good; 1819 ohms. 1664 stimuli since implantation, 1027 since August 11, 2016. There is no indication change the battery.  He tolerated the procedure well.  Review of Systems: A complete review of systems was remarkable for three seizures this month, per mom, patient has seizures every month, all other systems reviewed and negative.  Past Medical History Diagnosis Date  . ALLERGIC RHINITIS 12/26/2009  . Blood clot in vein   . Brown's syndrome   . Growth disorder   . Heart murmur    moderate MR  . Hyperlipidemia    No therapy  . Hypokalemia   . Mental retardation, mild (I.Q. 50-70)   . MITRAL VALVE PROLAPSE 06/22/2007  . SEIZURE DISORDER 06/22/2007  . Sleep apnea    cpap , last sleep study 10/01/11  . Unspecified hypothyroidism 06/22/2007   Hospitalizations: No., Head Injury: No., Nervous System Infections: No., Immunizations up to date: Yes.    Diagnosis of developmental delay as a toddler.   EEG [birth year]/07/86 was normal for gestational age.   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  Initial seizure 08/28/94 left locomotor with secondary generalization.   MRI of the brain 09/13/93: Scattered subcortical white matter lesions at the parietotemporal parieto-occipital junction is ischemic versus hamartomas.   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   Diagnosis attention deficit disorder inattentive type treated without  success with Cylert April 1995  MRI brain 04/04/94 stable differential diagnosis hamartoma, vasculitis,  ischemic, or embolic phenomenon.   Status epilepticus 01/31/95, and 04/15/95 Neurontin added to Tegretol   MRI brain 06/14/96 unchanged   [facility]mission to [facility]. Tegretol 30 mcg for militate. 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   Topiramate started in May 1999 unable to be tolerated with Tegretol and was tapered and discontinued.  January 2000 EEG showed right greater than left mid temporal sharp waves was otherwise well-organized EEG. He hospitalizations in March, July, October, in November for recurrent seizures.  Patient placed on Depakote in April 2000 and developed gagging abdominal pain headache and had significant change in transaminases and liver functions. Topamax was restarted and Keppra was added.  Vimpat was started July 16, 2012 and adjusted upwards.   [name] was added on November 18, 2012 and has been adjusted upwards. Topamax was changed to Trokendi XR in attempt to deal with sleepiness, unsteadiness caused by polypharmacy. We to these medications have been introduced, the patient experiences somewhat improved seizure control however Is quite likely that despite better seizure control, the patient is experiencing impairment in cognition and gait as a result of polypharmacy .  MRI of the brain on March 21, 2013. The patient has extensive calcification to the basal ganglia bilaterally, normal ventricle size, and no acute findings. EKG was unchanged  Birth History He was a breech presentation, cesarean section delivery. He had intrauterine growth retardation, and failure to thrive.  Behavior  History Mild intellectual disability  Surgical History Procedure Laterality Date  . EYE SURGERY     X2 for Brown Syndrome  . IMPLANTATION VAGAL NERVE STIMULATOR     Left  . PORTACATH PLACEMENT     and removal 04/08/12   Family History family history includes Asthma in his father; Breast cancer in his mother; Congestive Heart Failure in his maternal grandfather; Coronary artery disease in his maternal grandfather and paternal grandfather; Diabetes in his father; Lung cancer in his paternal grandfather; Pneumonia in his maternal grandfather and paternal grandmother; Stroke in his paternal grandmother. Family history is negative for migraines, seizures, intellectual disabilities, blindness, deafness, birth defects, chromosomal disorder, or autism.  Social History Social History   Socioeconomic History  . Marital status: Single  . Years of education: 64   . Highest education level: High School  Needs  . Financial resource strain: Not on file  . Food insecurity - worry: Not on file  . Food insecurity - inability: Not on file  . Transportation needs - medical: Not on file  . Transportation needs - non-medical: Not on file  Occupational History  . Occupation: Disabled    Employer: UNEMPLOYED  Tobacco Use  . Smoking status: Never Smoker  . Smokeless tobacco: Never Used  Substance and Sexual Activity  . Alcohol use: No    Alcohol/week: 0.0 oz  . Drug use: No  . 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     He lives with his parents.  He enjoys helping around the house, Nascar, and football.   Allergies Allergen Reactions  . Antihistamines, Chlorpheniramine-Type Other (See Comments)    Other Reaction: Dimetapp causes seizures  . Divalproex Sodium Other (See Comments)    Nausea, vomiting, liver and kidney dysfunction  . Phenytoin Rash and Other (See Comments)     Dilantin causes more seizures.   . Valproic Acid And Related Other (See Comments)    Shuts down systems  . Vancomycin Rash, Other (See Comments) and Swelling    If given too fast, causes red man reaction Other Reaction: Redman's Syndrome   Physical Exam BP 120/90   Pulse 80   Ht 5' 3.25" (1.607 m)   Wt 126 lb 9.6 oz (57.4 kg)   BMI 22.25 kg/m   Not examined today  Assessment 1. 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 2. 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  Discussion [name] had also been seen today by my nurse practitioner, Rockwell Germany.  For that reason, I did not examine him today.  She had to assess him for CPAP for his obstructive sleep apnea.  The vagal nerve stimulator is working well.  I did not re-program it.    Plan He will return to see me or Otila Kluver in 3 months' time, sooner based on clinical need.   Medication List    Accurate as of 09/09/17 10:33 AM.      acetaminophen 500 MG tablet Commonly known as:  TYLENOL Take 1,000 mg by mouth every 6 (six) hours as needed for headache.   carbamazepine 100 MG chewable tablet Commonly known as:  TEGRETOL Chew 2 tablets in the morning, 2 tablets at midday and 1+1/2 tablets in the evening   cetirizine 10 MG tablet Commonly known as:  ZYRTEC Take 10 mg by mouth daily.   diazepam 2 MG tablet Commonly known as:  VALIUM Take 1 tablet every 6 - 8 hours as needed for anxiety or seizures   guaifenesin 400 MG Tabs tablet Commonly known as:  HUMIBID E Take 400 mg by mouth 2 (two) times daily.   K-PHOS 500 MG tablet Generic drug:  potassium phosphate (monobasic) TAKE 1 TABLET BY MOUTH FOUR TIMES A DAY   KEPPRA 500 MG tablet Generic drug:  levETIRAcetam Take 3 tablets (1,500 mg total) by mouth 2 (two) times daily.   lacosamide 50 MG Tabs tablet Commonly known as:  VIMPAT Take 2 in the AM then 2 at night   levothyroxine 75 MCG tablet Commonly known as:  SYNTHROID,  LEVOTHROID Take 75 mcg by mouth daily before breakfast.   LORazepam 2 MG/ML concentrated solution Commonly known as:  ATIVAN Withdraw 3ml (2mg ) and 64ml  Normal saline into 3 ml syringe. Administer IV push over 3-5 minutes as needed for recurrent seizures   multivitamin with minerals Tabs tablet Take 1 tablet by mouth daily.   ondansetron 4 MG disintegrating tablet Commonly known as:  ZOFRAN-ODT Take 1 tablet as needed for nausea and vomiting   [name] 10 MG tablet Generic drug:  cloBAZam TAKE 1 TABLET BY MOUTH EVERY MORNING AND THE AFTERNOON AND 2 TABLETS AT BEDTIME   PHENObarbital 65 MG/ML injection Commonly known as:  LUMINAL Withdraw 72ml (65mg ) Phenobarbital and 78ml Normal Saline into 20ml syringe. Adminster IV push for 3-5 minutes as needed for recurrent seizures   promethazine 25 MG suppository Commonly known as:  PHENERGAN Place 1 suppository (25 mg total) rectally every 6 (six) hours as needed for nausea or vomiting.  rosuvastatin 10 MG tablet Commonly known as:  CRESTOR Take 10 mg by mouth at bedtime.   Topiramate ER 200 MG Cp24 Commonly known as:  TROKENDI XR Take 1 capsule by mouth at bedtime   Topiramate ER 50 MG Cp24 Commonly known as:  TROKENDI XR Take 2 capsules by mouth at bedtime.    The medication list was reviewed and reconciled. All changes or newly prescribed medications were explained.  A complete medication list was provided to the patient/caregiver.  Jodi Geralds MD

## 2017-09-11 ENCOUNTER — Encounter (INDEPENDENT_AMBULATORY_CARE_PROVIDER_SITE_OTHER): Payer: Self-pay | Admitting: Family

## 2017-09-20 ENCOUNTER — Other Ambulatory Visit (INDEPENDENT_AMBULATORY_CARE_PROVIDER_SITE_OTHER): Payer: Self-pay | Admitting: Pediatrics

## 2017-09-20 DIAGNOSIS — G40319 Generalized idiopathic epilepsy and epileptic syndromes, intractable, without status epilepticus: Secondary | ICD-10-CM

## 2017-10-01 ENCOUNTER — Other Ambulatory Visit (INDEPENDENT_AMBULATORY_CARE_PROVIDER_SITE_OTHER): Payer: Self-pay | Admitting: Pediatrics

## 2017-10-01 DIAGNOSIS — G40109 Localization-related (focal) (partial) symptomatic epilepsy and epileptic syndromes with simple partial seizures, not intractable, without status epilepticus: Secondary | ICD-10-CM

## 2017-10-01 DIAGNOSIS — G40319 Generalized idiopathic epilepsy and epileptic syndromes, intractable, without status epilepticus: Secondary | ICD-10-CM

## 2017-10-01 DIAGNOSIS — G40119 Localization-related (focal) (partial) symptomatic epilepsy and epileptic syndromes with simple partial seizures, intractable, without status epilepticus: Secondary | ICD-10-CM

## 2017-10-02 ENCOUNTER — Other Ambulatory Visit (INDEPENDENT_AMBULATORY_CARE_PROVIDER_SITE_OTHER): Payer: Self-pay | Admitting: Family

## 2017-10-02 DIAGNOSIS — G40319 Generalized idiopathic epilepsy and epileptic syndromes, intractable, without status epilepticus: Secondary | ICD-10-CM

## 2017-10-02 DIAGNOSIS — G40119 Localization-related (focal) (partial) symptomatic epilepsy and epileptic syndromes with simple partial seizures, intractable, without status epilepticus: Secondary | ICD-10-CM

## 2017-10-02 DIAGNOSIS — G40109 Localization-related (focal) (partial) symptomatic epilepsy and epileptic syndromes with simple partial seizures, not intractable, without status epilepticus: Secondary | ICD-10-CM

## 2017-10-02 MED ORDER — VIMPAT 50 MG PO TABS: ORAL_TABLET | ORAL | 5 refills | Status: DC

## 2017-10-02 NOTE — Telephone Encounter (Signed)
Needed to reprint Rx for Vimpat. TG

## 2017-10-05 ENCOUNTER — Telehealth (INDEPENDENT_AMBULATORY_CARE_PROVIDER_SITE_OTHER): Payer: Self-pay | Admitting: Pediatrics

## 2017-10-05 DIAGNOSIS — G40109 Localization-related (focal) (partial) symptomatic epilepsy and epileptic syndromes with simple partial seizures, not intractable, without status epilepticus: Secondary | ICD-10-CM

## 2017-10-05 DIAGNOSIS — G40319 Generalized idiopathic epilepsy and epileptic syndromes, intractable, without status epilepticus: Secondary | ICD-10-CM

## 2017-10-05 DIAGNOSIS — G40119 Localization-related (focal) (partial) symptomatic epilepsy and epileptic syndromes with simple partial seizures, intractable, without status epilepticus: Secondary | ICD-10-CM

## 2017-10-05 MED ORDER — VIMPAT 50 MG PO TABS: ORAL_TABLET | ORAL | 5 refills | Status: DC

## 2017-10-05 NOTE — Telephone Encounter (Signed)
°  Who's calling (name and relationship to patient) : Tamela Oddi (Mother) Best contact number: [phone] Provider they see: Dr. Gaynell Face  Reason for call: Per mom, [facility] [place]. She needs [facility] [address]. Per mom, [facility] [address] [facility]. [facility] [address].  [address]  [place], [place] [address]

## 2017-10-05 NOTE — Telephone Encounter (Signed)
Rx was faxed to the correct pharmacy

## 2017-10-05 NOTE — Telephone Encounter (Signed)
Pharmacy has been updated in the chart and rx has been printed

## 2017-10-13 ENCOUNTER — Other Ambulatory Visit (INDEPENDENT_AMBULATORY_CARE_PROVIDER_SITE_OTHER): Payer: Self-pay | Admitting: Pediatrics

## 2017-10-28 ENCOUNTER — Telehealth (INDEPENDENT_AMBULATORY_CARE_PROVIDER_SITE_OTHER): Payer: Self-pay | Admitting: Pediatrics

## 2017-10-28 DIAGNOSIS — G40319 Generalized idiopathic epilepsy and epileptic syndromes, intractable, without status epilepticus: Secondary | ICD-10-CM

## 2017-10-28 DIAGNOSIS — G40401 Other generalized epilepsy and epileptic syndromes, not intractable, with status epilepticus: Secondary | ICD-10-CM

## 2017-10-28 MED ORDER — LORAZEPAM 2 MG/ML PO CONC: ORAL | 5 refills | Status: DC

## 2017-10-28 MED ORDER — PHENOBARBITAL SODIUM 65 MG/ML IJ SOLN: INTRAMUSCULAR | 5 refills | Status: DC

## 2017-10-28 NOTE — Telephone Encounter (Signed)
°  Who's calling (name and relationship to patient) : [place] contact number: [phone] Provider they see: Gaynell Face Reason for call: Need Rx sent to pharmacy.  Please call to verify his medications need.  Could not understand what was said.    PRESCRIPTION REFILL ONLY  Name of prescription: Lorazepam and   Pharmacy:

## 2017-10-28 NOTE — Telephone Encounter (Signed)
Rx has been printed and placed on Dr. Hickling's desk 

## 2017-10-28 NOTE — Telephone Encounter (Signed)
Signed and returned for disposition

## 2017-10-28 NOTE — Telephone Encounter (Signed)
°  Who's calling (name and relationship to patient) : Jeani Hawking @ [facility]  Best contact number: [phone]  Provider they see: Dr Gaynell Face  Reason for call: Jeani Hawking requested to have RX sent to [facility] pharmacy; rx was sent to wrong pharmacy Fax [phone] [place]  Name of prescription: lorazepam  Pharmacy: [facility]

## 2017-10-28 NOTE — Telephone Encounter (Signed)
Rx has been faxed to [facility]

## 2017-11-26 ENCOUNTER — Encounter: Payer: Self-pay | Admitting: Gastroenterology

## 2017-12-10 ENCOUNTER — Ambulatory Visit (INDEPENDENT_AMBULATORY_CARE_PROVIDER_SITE_OTHER): Payer: Medicaid Other | Admitting: Pediatrics

## 2017-12-10 ENCOUNTER — Encounter (INDEPENDENT_AMBULATORY_CARE_PROVIDER_SITE_OTHER): Payer: Self-pay | Admitting: Pediatrics

## 2017-12-10 VITALS — BP 110/80 | HR 80 | Ht 63.25 in | Wt 127.0 lb

## 2017-12-10 DIAGNOSIS — G40319 Generalized idiopathic epilepsy and epileptic syndromes, intractable, without status epilepticus: Secondary | ICD-10-CM | POA: Diagnosis not present

## 2017-12-10 DIAGNOSIS — F70 Mild intellectual disabilities: Secondary | ICD-10-CM | POA: Diagnosis not present

## 2017-12-10 DIAGNOSIS — G40209 Localization-related (focal) (partial) symptomatic epilepsy and epileptic syndromes with complex partial seizures, not intractable, without status epilepticus: Secondary | ICD-10-CM

## 2017-12-10 DIAGNOSIS — E876 Hypokalemia: Secondary | ICD-10-CM

## 2017-12-10 DIAGNOSIS — R269 Unspecified abnormalities of gait and mobility: Secondary | ICD-10-CM | POA: Diagnosis not present

## 2017-12-10 MED ORDER — POTASSIUM PHOSPHATE MONOBASIC 500 MG PO TABS: ORAL_TABLET | ORAL | 4 refills | Status: DC

## 2017-12-10 NOTE — Progress Notes (Signed)
Patient: [name] MRN: [id number] Sex: male DOB: Feb 01, 1986  Provider: Wyline Copas, MD Location of Care: [facility]  Note type: Routine return visit  History of Present Illness: Referral Source: Anastasia Pall, MD History from: both parents, patient and [facility] chart Chief Complaint: VNS/Seizures  [name] is a 32 y.o. male who was evaluated on December 10, 2017 for the first time since September 09, 2017.  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  He has an implanted vagal nerve stimulator that has definitely helped his seizure frequency.  He is on multiple antiepileptic medications and also potassium for hypokalemia.  He is managed jointly with Dr. Jeoffrey Massed at [facility].  It appears that [name] will soon have a service dog to help him with his seizures.  The dog has been to the home on more than one occasion and has picked up an impending seizure before it was evident to anyone in the room.  [name] had separate generalized tonic-clonic seizures on November 21, 2017 and November 22, 2017, lasting 5 to 6 minutes.  It is not clear to me why his parents did not treat him with Diastat or midazolam.  In both cases, the seizures started with 30 seconds of apnea during which time he became mildly cyanotic.  His breathing returned on his own.  In February, he had 4 seizures over 24 hour period, which caused the family to open up his port and to treat him.  He had his upper wisdom teeth out on February 21 because they were carious.  He had increased migraines around the time his wisdom tooth removed, but generally has only 1 to 2 per month.  Dr. Lisabeth Devoid has  steadily lowered levetiracetam.  The seizures have not markedly increased.  In addition, the family has noted several seizures that seem to have been aborted by the vagal nerve stimulator.  To the extent that is true, it is likely that newer versions of the vagal nerve stimulator may prove to be even more effective.  I interrogated his vagal nerve stimulator but did not re-program it.  The device is working well.  The details are noted below.  Procedure: Interrogation and reprogramming of Vagal Nerve Stimulator  Implantation of the current stimulator 104 serial # [id number] implanted 04/19/14   Interrogation of the vagal nerve stimulator: Voltage 2.25 mA. Frequency 20 Hz, pulse width 250 microseconds, signal time on 7 seconds, signal time off 1.1 minutes, magnet current 2.50 mA, magnet signal time on 60 seconds, magnet pulse width 250 microseconds. I reprogrammed the computer for diagnostics.  The normal mode diagnostics using his current settings revealed the battery is nearly full, and communication, output, and impedance are good; 1819ohms. 1664stimuli since implantation, 1027since August 11, 2016. There is no indication change the battery.  He tolerated the procedure well.  Review of Systems: A complete review of systems was remarkable for diagnosed with Diverticulosis, one seizure on March 2 and 3, migraines have increased, all other systems reviewed and negative.  Past Medical History Diagnosis Date  . ALLERGIC RHINITIS 12/26/2009  . Blood clot in vein   . Brown's syndrome   . Growth disorder   . Heart murmur    moderate MR  . Hyperlipidemia    No  therapy  . Hypokalemia   . Mental retardation, mild (I.Q. 50-70)   . MITRAL VALVE PROLAPSE 06/22/2007  . SEIZURE DISORDER 06/22/2007  . Sleep apnea    cpap , last sleep study 10/01/11  . Unspecified hypothyroidism 06/22/2007   Hospitalizations: No., Head Injury: No., Nervous System Infections: No., Immunizations up to date: Yes.     Diagnosis of developmental delay as a toddler.   EEG 11/07/86 was normal for gestational age.   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  Initial seizure 08/28/94 left locomotor with secondary generalization.   MRI of the brain 09/13/93: Scattered subcortical white matter lesions at the parietotemporal parieto-occipital junction is ischemic versus hamartomas.   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   Diagnosis attention deficit disorder inattentive type treated without success with Cylert April 1995  MRI brain 04/04/94 stable differential diagnosis hamartoma, vasculitis, ischemic, or embolic phenomenon.   Status epilepticus 01/31/95, and 04/15/95 Neurontin added to Tegretol   MRI brain 06/14/96 unchanged   Admission to [facility] Tegretol 30 mcg for militate. 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   Topiramate started in May 1999 unable to be tolerated with Tegretol and was tapered and discontinued.  January 2000 EEG showed right greater than left mid temporal sharp waves was otherwise well-organized EEG. He hospitalizations in March, July, October, in November for recurrent seizures.  Patient placed on Depakote in April 2000 and developed gagging abdominal pain headache and had significant change in transaminases and liver functions. Topamax was restarted and Keppra was added.  Vimpat was started July 16, 2012 and adjusted  upwards.   [name] was added on November 18, 2012 and has been adjusted upwards. Topamax was changed to Trokendi XR in attempt to deal with sleepiness, unsteadiness caused by polypharmacy. We to these medications have been introduced, the patient experiences somewhat improved seizure control however Is quite likely that despite better seizure control, the patient is experiencing impairment in cognition and gait as a result of polypharmacy .  MRI of the brain on March 21, 2013. The patient has extensive calcification to the basal ganglia bilaterally, normal ventricle size, and no acute findings. EKG was unchanged  Birth History He was a breech presentation, cesarean section delivery. He had intrauterine growth retardation, and failure to thrive.  Behavior History Mild intellectual disability  Surgical History Procedure Laterality Date  . EYE SURGERY     X2 for Brown Syndrome  . IMPLANTATION VAGAL NERVE STIMULATOR     Left  . PORTACATH PLACEMENT     and removal 04/08/12   Family History family history includes Asthma in his father; Breast cancer in his mother; Congestive Heart Failure in his maternal grandfather; Coronary artery disease in his maternal grandfather and paternal grandfather; Diabetes in his father; Lung cancer in his paternal grandfather; Pneumonia in his maternal grandfather and paternal grandmother; Stroke in his paternal grandmother. Family history is negative for migraines, seizures, intellectual disabilities, blindness, deafness, birth defects, chromosomal disorder, or autism.  Social History Social History   Socioeconomic History  .  Marital status: Single  . Years of education: 67  . Highest education level:  High school  Occupational History  . Occupation: Disabled    Fish farm manager: UNEMPLOYED  Social Needs  . Financial resource strain: Not on file  . Food insecurity:    Worry: Not on file    Inability: Not on file  . Transportation needs:    Medical: Not on  file    Non-medical: Not on file  Tobacco Use  . Smoking status: Never Smoker  . Smokeless tobacco: Never Used  Substance and Sexual Activity  . Alcohol use: No    Alcohol/week: 0.0 oz  . Drug use: No  . Sexual activity: Never  Lifestyle  . Physical activity:    Days per week:  Limited  . Stress:  Not apparent  Relationships  . Social connections:    Attends religious service:  Weekly  Other Topics Concern  . 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     He lives with his parents.     He enjoys helping around the house, Nascar, and football, and currently basketball   Allergies Allergen Reactions  . Antihistamines, Chlorpheniramine-Type Other (See Comments)    Other Reaction: Dimetapp causes seizures  . Divalproex Sodium Other (See Comments)    Nausea, vomiting, liver and kidney dysfunction  . Phenytoin Rash and Other (See Comments)    Dilantin causes more seizures.   . Valproic Acid And Related Other (See Comments)    Shuts down systems  . 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 Ears, Nose and Throat: Otoscopic: tympanic membranes normal; pharynx: oropharynx is pink without exudates or tonsillar hypertrophy Neck: supple, full range of motion, no cranial or cervical bruits Respiratory: auscultation clear Cardiovascular: no murmurs, pulses are normal Musculoskeletal: no skeletal deformities or apparent scoliosis Skin: no rashes or neurocutaneous  lesions  Neurologic Exam  Mental Status: alert; oriented to person, place and year; knowledge is below normal for age; language is below normal but adequate for him to express his wants and needs, his thoughts, and to follow commands Cranial Nerves: visual fields are full to double simultaneous stimuli; extraocular movements are full and dysconjugate; 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 which causes a narrow palpebral fissure and altered movement; pupils are round reactive to light; funduscopic examination shows positive red reflex bilaterally; symmetric facial strength; midline tongue and uvula; air conduction is greater than bone conduction bilaterally Motor: Normal functional strength, tone and mass; clumsy fine motor movements; no pronator drift Sensory: intact responses to cold, vibration, proprioception and stereognosis Coordination: good finger-to-nose, rapid repetitive alternating movements and finger apposition Gait and Station: Antalgic gait and station; feet are valgus and slightly broad-based; balance is poor; Romberg exam is negative Reflexes: symmetric and diminished bilaterally; no clonus; bilateral flexor plantar responses  Assessment 1. Partial epilepsy with impairment of consciousness, G40.209. 2. 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  3. Abnormality of gait, R26.9. 4. Mild intellectual disability, F70. 5. Hypokalemia, E87.6.  Discussion [name] is doing well.  Though he has many medical problems, his seizures in general are in better control, although early March is problematic.  I am in agreement with Dr. Lisabeth Devoid that trying to simplify his antiepileptic regimen without triggering recurrent seizures is very important.  I hope that it is successful.  Plan The only prescription that needed to be refilled was for potassium phosphate, which I did.  We will refill other medications as needed.  I asked [name] to return to see me in 3 months' time.   I spent 25 minutes of face-to-face time with [name] and in addition evaluated his vagal nerve stimulator.   Medication List    Accurate as of 12/10/17 11:59 PM.      acetaminophen 500 MG tablet Commonly known as:  TYLENOL Take 1,000 mg by mouth every 6 (six) hours as needed for headache.   carbamazepine 100 MG chewable tablet Commonly known as:  TEGRETOL Chew 2 tablets in the morning, 2 tablets at midday and 1+1/2 tablets in the evening   cetirizine 10 MG tablet Commonly known as:  ZYRTEC Take 10 mg by mouth daily.   diazepam 2 MG tablet Commonly known as:  VALIUM Take 1 tablet every 6 - 8 hours as needed for anxiety or seizures   guaifenesin 400 MG Tabs tablet Commonly known as:  HUMIBID E Take 400 mg by mouth 2 (two) times daily.   KEPPRA 500 MG tablet Generic drug:  levETIRAcetam Take 1 tablets (500 mg total) by mouth 2 (two) times daily.   levothyroxine 25 MCG tablet Commonly known as:  SYNTHROID, LEVOTHROID Take 25 mcg by mouth daily before breakfast.   LORazepam 2 MG/ML concentrated solution Commonly known as:  ATIVAN Withdraw 21ml (2mg ) and 61ml  Normal saline into 3 ml syringe. Administer IV push over 3-5 minutes as needed for recurrent seizures   multivitamin with minerals Tabs tablet Take 1 tablet by mouth daily.   ondansetron 4 MG disintegrating tablet Commonly known as:  ZOFRAN-ODT Take 1 tablet as needed for nausea and vomiting   [name] 10 MG tablet Generic drug:  cloBAZam TAKE 1 TABLET BY MOUTH IN THE MORNING, 1 TAB IN THE AFTERNOON AND 2 TABLETS AT BEDTIME   PHENObarbital 65 MG/ML injection Commonly known as:  LUMINAL Withdraw 40ml (65mg ) Phenobarbital and 9ml Normal Saline into 81ml syringe. Adminster IV push for 3-5 minutes as needed for recurrent seizures   potassium phosphate (monobasic) 500 MG tablet Commonly known as:  K-PHOS TAKE 1 TABLET (500mg ) BY MOUTH FOUR TIMES A DAY   promethazine 25 MG suppository Commonly known as:  PHENERGAN Place 1  suppository (25 mg total) rectally every 6 (six) hours as needed for nausea or vomiting.   rosuvastatin 10 MG tablet Commonly known as:  CRESTOR Take 10 mg by mouth at bedtime.   Topiramate ER 200 MG Cp24 Commonly known as:  TROKENDI XR Take 1 capsule by mouth at bedtime   Topiramate ER 50 MG Cp24 Commonly known as:  TROKENDI XR Take 1 capsule by mouth at bedtime.   VIMPAT 100 MG Tabs tablet Generic drug:  lacosamide TAKE 1 TABLET IN THE MORNING, THEN 1 TABLET AT NIGHT.    The medication list was reviewed and reconciled. All changes or newly prescribed medications were explained.  A complete medication list was provided to the patient/caregiver.  Jodi Geralds MD

## 2017-12-22 ENCOUNTER — Telehealth (INDEPENDENT_AMBULATORY_CARE_PROVIDER_SITE_OTHER): Payer: Self-pay | Admitting: Pediatrics

## 2017-12-22 NOTE — Telephone Encounter (Signed)
I completed and faxed the form as requested. I called [name] and talked with her about the seizure action plan. She was unaware that it was needed and said that she would investigate and call me back. TG

## 2017-12-22 NOTE — Telephone Encounter (Signed)
Received fax from [facility] requesting that we fax signed orders for all medications the patient is currently taking that has been prescribed from our provider. They are also needing a document that would state what the protocol should be in the event that the patient has a seizure.  Please fax information to   [phone]                                             Alt# [phone]  *Document has been placed in providers basket up front*

## 2017-12-22 NOTE — Telephone Encounter (Signed)
Forms have been placed on [name]'s desk 

## 2018-01-11 ENCOUNTER — Encounter: Payer: Self-pay | Admitting: Gastroenterology

## 2018-01-11 ENCOUNTER — Ambulatory Visit (INDEPENDENT_AMBULATORY_CARE_PROVIDER_SITE_OTHER): Payer: Medicaid Other | Admitting: Gastroenterology

## 2018-01-11 ENCOUNTER — Other Ambulatory Visit (INDEPENDENT_AMBULATORY_CARE_PROVIDER_SITE_OTHER): Payer: Self-pay

## 2018-01-11 VITALS — BP 100/80 | HR 64 | Ht 63.0 in | Wt 131.5 lb

## 2018-01-11 DIAGNOSIS — K625 Hemorrhage of anus and rectum: Secondary | ICD-10-CM

## 2018-01-11 DIAGNOSIS — K648 Other hemorrhoids: Secondary | ICD-10-CM

## 2018-01-11 DIAGNOSIS — K5900 Constipation, unspecified: Secondary | ICD-10-CM | POA: Diagnosis not present

## 2018-01-11 LAB — HEMOGLOBIN: HEMOGLOBIN: 12.9 g/dL — AB (ref 13.0–17.0)

## 2018-01-11 LAB — HEMATOCRIT: HCT: 38.8 % — ABNORMAL LOW (ref 39.0–52.0)

## 2018-01-11 MED ORDER — HYDROCORTISONE ACETATE 25 MG RE SUPP: 25.0000 mg | Freq: Every day | RECTAL | 1 refills | Status: DC

## 2018-01-11 NOTE — Patient Instructions (Addendum)
Constipation, Adult Constipation is when a person:  Poops (has a bowel movement) fewer times in a week than normal.  Has a hard time pooping.  Has poop that is dry, hard, or bigger than normal.  Follow these instructions at home: Eating and drinking   Eat foods that have a lot of fiber, such as: ? Fresh fruits and vegetables. ? Whole grains. ? Beans.  Eat less of foods that are high in fat, low in fiber, or overly processed, such as: ? [place] fries. ? Hamburgers. ? Cookies. ? Candy. ? Soda.  Drink enough fluid to keep your pee (urine) clear or pale yellow. General instructions  Exercise regularly or as told by your doctor.  Go to the restroom when you feel like you need to poop. Do not hold it in.  Take over-the-counter and prescription medicines only as told by your doctor. These include any fiber supplements.  Do pelvic floor retraining exercises, such as: ? Doing deep breathing while relaxing your lower belly (abdomen). ? Relaxing your pelvic floor while pooping.  Watch your condition for any changes.  Keep all follow-up visits as told by your doctor. This is important. Contact a doctor if:  You have pain that gets worse.  You have a fever.  You have not pooped for 4 days.  You throw up (vomit).  You are not hungry.  You lose weight.  You are bleeding from the anus.  You have thin, pencil-like poop (stool). Get help right away if:  You have a fever, and your symptoms suddenly get worse.  You leak poop or have blood in your poop.  Your belly feels hard or bigger than normal (is bloated).  You have very bad belly pain.  You feel dizzy or you faint. This information is not intended to replace advice given to you by your health care provider. Make sure you discuss any questions you have with your health care provider. Document Released: 02/25/2008 Document Revised: 03/28/2016 Document Reviewed: 02/27/2016 Elsevier Interactive Patient Education   2018 [place] to the basement for labs today  Take Benefiber 1 tablespoon three times a day with meals or Use Fiberchoice tablets 1-2 daily  We will send Anusol suppositories to your pharmacy

## 2018-01-11 NOTE — Progress Notes (Signed)
[name]    [id number]    1986-01-25  Primary Care Physician:Badger, Rebeca Alert, MD  Referring Physician: Chesley Noon, [place], [place] [address]  Chief complaint: Bright red blood per rectum HPI:  32 year old male very pleasant with mild intellectual disability, seizure disorder is here accompanied by his parents with complaints of small volume bright red blood per rectum after bowel movement , usually noted on toilet paper when he wipes.  He needs help with his personal hygiene as his fine motor skills are not well-developed.  Denies any rectal discomfort or abdominal pain.  No nausea, vomiting, loss of appetite or weight loss. No family history of colon cancer or IBD  Outpatient Encounter Medications as of 01/11/2018  Medication Sig  . acetaminophen (TYLENOL) 500 MG tablet Take 1,000 mg by mouth every 6 (six) hours as needed for headache.  . carbamazepine (TEGRETOL) 100 MG chewable tablet Chew 2 tablets in the morning, 2 tablets at midday and 1+1/2 tablets in the evening (Patient taking differently: Chew 150-200 mg by mouth See admin instructions. Chew 200mg  in the morning, 200mg  at midday and 150mg  in the evening)  . cetirizine (ZYRTEC) 10 MG tablet Take 10 mg by mouth daily.  . diazepam (VALIUM) 2 MG tablet Take 1 tablet every 6 - 8 hours as needed for anxiety or seizures (Patient taking differently: Take 2 mg by mouth every 6 (six) hours as needed for anxiety (seizures). )  . guaifenesin (HUMIBID E) 400 MG TABS tablet Take 400 mg by mouth 2 (two) times daily.  Marland Kitchen levETIRAcetam (KEPPRA) 500 MG tablet Take 500 mg by mouth 2 (two) times daily.  Marland Kitchen levothyroxine (SYNTHROID, LEVOTHROID) 25 MCG tablet Take 25 mcg by mouth daily before breakfast.   . LORazepam (ATIVAN) 2 MG/ML concentrated solution Withdraw 5ml (2mg ) and 64ml  Normal saline into 3 ml syringe. Administer IV push over 3-5 minutes as needed for recurrent seizures  . Multiple  Vitamin (MULTIVITAMIN WITH MINERALS) TABS Take 1 tablet by mouth daily.  . ondansetron (ZOFRAN-ODT) 4 MG disintegrating tablet Take 1 tablet as needed for nausea and vomiting  . [name] 10 MG tablet TAKE 1 TABLET BY MOUTH IN THE MORNING, 1 TAB IN THE AFTERNOON AND 2 TABLETS AT BEDTIME  . PHENObarbital (LUMINAL) 65 MG/ML injection Withdraw 72ml (65mg ) Phenobarbital and 55ml Normal Saline into 9ml syringe. Adminster IV push for 3-5 minutes as needed for recurrent seizures  . potassium phosphate, monobasic, (K-PHOS) 500 MG tablet TAKE 1 TABLET (500mg ) BY MOUTH FOUR TIMES A DAY  . promethazine (PHENERGAN) 25 MG suppository Place 1 suppository (25 mg total) rectally every 6 (six) hours as needed for nausea or vomiting.  . rosuvastatin (CRESTOR) 10 MG tablet Take 10 mg by mouth at bedtime.  . Topiramate ER (TROKENDI XR) 200 MG CP24 Take 1 capsule by mouth at bedtime (Patient taking differently: Take 200 mg by mouth at bedtime. Take with 50mg  tab to equal 250mg (total))  . Topiramate ER (TROKENDI XR) 50 MG CP24 Take 2 capsules by mouth at bedtime. (Patient taking differently: Take 50 mg by mouth at bedtime. Take with 200mg  tab to equal 250mg (total))  . Topiramate ER (TROKENDI XR) 50 MG CP24 Take 1 tablet by mouth at bedtime.  . [DISCONTINUED] KEPPRA 500 MG tablet Take 3 tablets (1,500 mg total) by mouth 2 (two) times daily. (Patient taking differently: Take 500 mg by mouth 2 (two) times daily. )  . [  DISCONTINUED] VIMPAT 50 MG TABS tablet TAKE 2 TABLETS IN THE MORNING, THEN 2 TABLETS AT NIGHT. (Patient taking differently: Take 100 mg by mouth 2 (two) times daily. TAKE 1 TABLET AT NIGHT.)   No facility-administered encounter medications on file as of 01/11/2018.     Allergies as of 01/11/2018 - Review Complete 01/11/2018  Allergen Reaction Noted  . Antihistamines, chlorpheniramine-type Other (See Comments) 04/21/2014  . Divalproex sodium Other (See Comments)   . Phenytoin Rash and Other (See Comments)   .  Valproic acid and related Other (See Comments) 04/21/2014  . Vancomycin Rash, Other (See Comments), and Swelling 01/08/2012    Past Medical History:  Diagnosis Date  . ALLERGIC RHINITIS 12/26/2009  . Blood clot in vein   . Brown's syndrome   . Growth disorder   . Headache   . Heart murmur    moderate MR  . Hyperlipidemia    No therapy  . Hypokalemia   . Mental retardation, mild (I.Q. 50-70)   . MITRAL VALVE PROLAPSE 06/22/2007  . SEIZURE DISORDER 06/22/2007  . Sleep apnea    cpap , last sleep study 10/01/11  . Unspecified hypothyroidism 06/22/2007    Past Surgical History:  Procedure Laterality Date  . ANKLE SURGERY Left   . EYE SURGERY     X2 for Brown Syndrome  . IMPLANTATION VAGAL NERVE STIMULATOR     Left  . PORTACATH PLACEMENT     and removal 04/08/12    Family History  Problem Relation Age of Onset  . Breast cancer Mother   . Colon polyps Mother   . Clotting disorder Mother   . Diabetes Father   . Hypertension Father   . Colon polyps Father   . Hyperlipidemia Father   . Coronary artery disease Maternal Grandfather   . Congestive Heart Failure Maternal Grandfather        Died at 78  . Pneumonia Maternal Grandfather   . Coronary artery disease Paternal Grandfather   . Lung cancer Paternal Grandfather        Died at 16  . Pneumonia Paternal Grandmother        Died at 40  . Stroke Paternal Grandmother     Social History   Socioeconomic History  . Marital status: Single    Spouse name: Not on file  . Number of children: 0  . Years of education: Not on file  . Highest education level: Not on file  Occupational History  . Occupation: Disabled    Fish farm manager: UNEMPLOYED  Social Needs  . Financial resource strain: Not on file  . Food insecurity:    Worry: Not on file    Inability: Not on file  . Transportation needs:    Medical: Not on file    Non-medical: Not on file  Tobacco Use  . Smoking status: Never Smoker  . Smokeless tobacco: Never Used    Substance and Sexual Activity  . Alcohol use: No    Alcohol/week: 0.0 oz  . Drug use: No  . Sexual activity: Never  Lifestyle  . Physical activity:    Days per week: Not on file    Minutes per session: Not on file  . Stress: Not on file  Relationships  . Social connections:    Talks on phone: Not on file    Gets together: Not on file    Attends religious service: Not on file    Active member of club or organization: Not on file    Attends  meetings of clubs or organizations: Not on file    Relationship status: Not on file  . Intimate partner violence:    Fear of current or ex partner: Not on file    Emotionally abused: Not on file    Physically abused: Not on file    Forced sexual activity: Not on file  Other Topics Concern  . 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    He lives with his parents.    He enjoys helping around the house, Nascar, and football.      Review of systems: Review of Systems  Constitutional: Negative for fever and chills.  HENT: Negative.   Eyes: Negative for blurred vision.  Respiratory: Negative for cough, shortness of breath and wheezing.   Cardiovascular: Negative for chest pain and palpitations.  Gastrointestinal: as per HPI Genitourinary: Negative for dysuria, urgency, frequency and hematuria.  Musculoskeletal: Negative for myalgias, back pain and joint pain.  Skin: Negative for itching and rash.  Neurological: Negative for dizziness, tremors, focal weakness, seizures and loss of consciousness.  Positive for headaches Endo/Heme/Allergies: Positive for seasonal allergies.  Psychiatric/Behavioral: Negative for depression, suicidal ideas and hallucinations.  All other systems reviewed and are negative.   Physical Exam: Vitals:   01/11/18 0859  BP: 100/80  Pulse: 64   Body mass index is 23.29 kg/m. Gen:      No acute distress,  petite, appears like an adolescent HEENT:  EOMI, sclera anicteric Neck:     No masses; no thyromegaly Lungs:    Clear to auscultation bilaterally; normal respiratory effort CV:         Regular rate and rhythm; no murmurs Abd:      + bowel sounds; soft, non-tender; no palpable masses, no distension Ext:    No edema; adequate peripheral perfusion Skin:      Warm and dry; no rash Neuro: alert and oriented x 3 Psych: normal mood and affect Rectal exam: Normal anal sphincter tone, no anal fissure or external hemorrhoids Anoscopy: Small internal hemorrhoids, no active bleeding, normal dentate line, no visible nodules  Data Reviewed:  Reviewed labs, radiology imaging, old records and pertinent past GI work up   Assessment and Plan/Recommendations:  32 year old male with mild intellectual disability is here with complaints of intermittent small-volume bright red blood per rectum likely secondary to bleeding from internal hemorrhoids Start Benefiber 1 tablespoon 3 times daily with meals Increase fluid intake to 8-10, 8 ounces cups of water daily Avoid excessive straining Will check hemoglobin Anusol suppository per rectum at bedtime as needed Return as needed  Damaris Hippo , MD [phone]    CC: Chesley Noon, MD

## 2018-01-13 ENCOUNTER — Encounter: Payer: Self-pay | Admitting: Gastroenterology

## 2018-02-13 ENCOUNTER — Observation Stay (HOSPITAL_COMMUNITY): Payer: Medicaid Other

## 2018-02-13 ENCOUNTER — Emergency Department (HOSPITAL_COMMUNITY): Payer: Medicaid Other

## 2018-02-13 ENCOUNTER — Encounter (HOSPITAL_COMMUNITY): Payer: Self-pay | Admitting: Emergency Medicine

## 2018-02-13 ENCOUNTER — Inpatient Hospital Stay (HOSPITAL_COMMUNITY)
Admission: EM | Admit: 2018-02-13 | Discharge: 2018-02-17 | DRG: 100 | Disposition: A | Discharge status: Home / Self Care | Payer: Medicaid Other | Attending: Pulmonary Disease | Admitting: Pulmonary Disease

## 2018-02-13 DIAGNOSIS — I340 Nonrheumatic mitral (valve) insufficiency: Secondary | ICD-10-CM | POA: Diagnosis present

## 2018-02-13 DIAGNOSIS — F70 Mild intellectual disabilities: Secondary | ICD-10-CM | POA: Diagnosis present

## 2018-02-13 DIAGNOSIS — J9811 Atelectasis: Secondary | ICD-10-CM | POA: Diagnosis not present

## 2018-02-13 DIAGNOSIS — G40901 Epilepsy, unspecified, not intractable, with status epilepticus: Secondary | ICD-10-CM | POA: Diagnosis not present

## 2018-02-13 DIAGNOSIS — G4733 Obstructive sleep apnea (adult) (pediatric): Secondary | ICD-10-CM | POA: Diagnosis present

## 2018-02-13 DIAGNOSIS — J690 Pneumonitis due to inhalation of food and vomit: Secondary | ICD-10-CM | POA: Diagnosis present

## 2018-02-13 DIAGNOSIS — R011 Cardiac murmur, unspecified: Secondary | ICD-10-CM | POA: Diagnosis present

## 2018-02-13 DIAGNOSIS — H269 Unspecified cataract: Secondary | ICD-10-CM | POA: Diagnosis present

## 2018-02-13 DIAGNOSIS — Z803 Family history of malignant neoplasm of breast: Secondary | ICD-10-CM | POA: Diagnosis not present

## 2018-02-13 DIAGNOSIS — Z8349 Family history of other endocrine, nutritional and metabolic diseases: Secondary | ICD-10-CM | POA: Diagnosis not present

## 2018-02-13 DIAGNOSIS — Z79899 Other long term (current) drug therapy: Secondary | ICD-10-CM

## 2018-02-13 DIAGNOSIS — E039 Hypothyroidism, unspecified: Secondary | ICD-10-CM | POA: Diagnosis present

## 2018-02-13 DIAGNOSIS — Z881 Allergy status to other antibiotic agents status: Secondary | ICD-10-CM | POA: Diagnosis not present

## 2018-02-13 DIAGNOSIS — G40911 Epilepsy, unspecified, intractable, with status epilepticus: Secondary | ICD-10-CM | POA: Diagnosis present

## 2018-02-13 DIAGNOSIS — Z823 Family history of stroke: Secondary | ICD-10-CM | POA: Diagnosis not present

## 2018-02-13 DIAGNOSIS — E785 Hyperlipidemia, unspecified: Secondary | ICD-10-CM | POA: Diagnosis present

## 2018-02-13 DIAGNOSIS — R569 Unspecified convulsions: Secondary | ICD-10-CM | POA: Diagnosis present

## 2018-02-13 DIAGNOSIS — Z888 Allergy status to other drugs, medicaments and biological substances status: Secondary | ICD-10-CM

## 2018-02-13 DIAGNOSIS — Z8371 Family history of colonic polyps: Secondary | ICD-10-CM | POA: Diagnosis not present

## 2018-02-13 DIAGNOSIS — I341 Nonrheumatic mitral (valve) prolapse: Secondary | ICD-10-CM | POA: Diagnosis present

## 2018-02-13 DIAGNOSIS — Z801 Family history of malignant neoplasm of trachea, bronchus and lung: Secondary | ICD-10-CM | POA: Diagnosis not present

## 2018-02-13 DIAGNOSIS — E876 Hypokalemia: Secondary | ICD-10-CM | POA: Diagnosis not present

## 2018-02-13 DIAGNOSIS — Z86718 Personal history of other venous thrombosis and embolism: Secondary | ICD-10-CM

## 2018-02-13 DIAGNOSIS — Z8249 Family history of ischemic heart disease and other diseases of the circulatory system: Secondary | ICD-10-CM | POA: Diagnosis not present

## 2018-02-13 DIAGNOSIS — Z79890 Hormone replacement therapy (postmenopausal): Secondary | ICD-10-CM

## 2018-02-13 DIAGNOSIS — Z833 Family history of diabetes mellitus: Secondary | ICD-10-CM | POA: Diagnosis not present

## 2018-02-13 DIAGNOSIS — Z832 Family history of diseases of the blood and blood-forming organs and certain disorders involving the immune mechanism: Secondary | ICD-10-CM

## 2018-02-13 DIAGNOSIS — R0689 Other abnormalities of breathing: Secondary | ICD-10-CM | POA: Diagnosis not present

## 2018-02-13 DIAGNOSIS — J9690 Respiratory failure, unspecified, unspecified whether with hypoxia or hypercapnia: Secondary | ICD-10-CM

## 2018-02-13 LAB — CBC WITH DIFFERENTIAL/PLATELET
Abs Immature Granulocytes: 0.1 10*3/uL (ref 0.0–0.1)
BASOS PCT: 0 %
Basophils Absolute: 0 10*3/uL (ref 0.0–0.1)
EOS ABS: 0.1 10*3/uL (ref 0.0–0.7)
EOS PCT: 1 %
HCT: 40.5 % (ref 39.0–52.0)
Hemoglobin: 12.8 g/dL — ABNORMAL LOW (ref 13.0–17.0)
Immature Granulocytes: 1 %
Lymphocytes Relative: 24 %
Lymphs Abs: 2.6 10*3/uL (ref 0.7–4.0)
MCH: 27.1 pg (ref 26.0–34.0)
MCHC: 31.6 g/dL (ref 30.0–36.0)
MCV: 85.6 fL (ref 78.0–100.0)
MONO ABS: 0.6 10*3/uL (ref 0.1–1.0)
MONOS PCT: 6 %
NEUTROS PCT: 68 %
Neutro Abs: 7.2 10*3/uL (ref 1.7–7.7)
PLATELETS: 233 10*3/uL (ref 150–400)
RBC: 4.73 MIL/uL (ref 4.22–5.81)
RDW: 13.8 % (ref 11.5–15.5)
WBC: 10.6 10*3/uL — ABNORMAL HIGH (ref 4.0–10.5)

## 2018-02-13 LAB — COMPREHENSIVE METABOLIC PANEL
ALT: 44 U/L (ref 17–63)
AST: 52 U/L — ABNORMAL HIGH (ref 15–41)
Albumin: 3.5 g/dL (ref 3.5–5.0)
Alkaline Phosphatase: 81 U/L (ref 38–126)
Anion gap: 12 (ref 5–15)
BUN: 9 mg/dL (ref 6–20)
CALCIUM: 8.3 mg/dL — AB (ref 8.9–10.3)
CHLORIDE: 108 mmol/L (ref 101–111)
CO2: 19 mmol/L — ABNORMAL LOW (ref 22–32)
CREATININE: 1.18 mg/dL (ref 0.61–1.24)
Glucose, Bld: 133 mg/dL — ABNORMAL HIGH (ref 65–99)
Potassium: 3.3 mmol/L — ABNORMAL LOW (ref 3.5–5.1)
Sodium: 139 mmol/L (ref 135–145)
TOTAL PROTEIN: 7 g/dL (ref 6.5–8.1)
Total Bilirubin: 0.6 mg/dL (ref 0.3–1.2)

## 2018-02-13 LAB — MRSA PCR SCREENING: MRSA by PCR: NEGATIVE

## 2018-02-13 MED ORDER — PROPOFOL 10 MG/ML IV BOLUS: 1.0000 mg/kg | Freq: Once | INTRAVENOUS | Status: DC

## 2018-02-13 MED ORDER — LORAZEPAM 2 MG/ML IJ SOLN
INTRAMUSCULAR | Status: AC
  Administered 2018-02-13: 2 mg
  Filled 2018-02-13: qty 1

## 2018-02-13 MED ORDER — ROCURONIUM BROMIDE 50 MG/5ML IV SOLN
INTRAVENOUS | Status: AC | PRN
  Administered 2018-02-13: 60 mg via INTRAVENOUS

## 2018-02-13 MED ORDER — SODIUM CHLORIDE 0.9 % IV SOLN
100.0000 mg | Freq: Two times a day (BID) | INTRAVENOUS | Status: DC
  Administered 2018-02-14 – 2018-02-16 (×6): 100 mg via INTRAVENOUS
  Filled 2018-02-13 (×9): qty 10

## 2018-02-13 MED ORDER — ENOXAPARIN SODIUM 40 MG/0.4ML ~~LOC~~ SOLN
40.0000 mg | SUBCUTANEOUS | Status: DC
  Administered 2018-02-13 – 2018-02-16 (×4): 40 mg via SUBCUTANEOUS
  Filled 2018-02-13 (×5): qty 0.4

## 2018-02-13 MED ORDER — SODIUM CHLORIDE 0.9 % IV SOLN: 250.0000 mL | INTRAVENOUS | Status: DC | PRN

## 2018-02-13 MED ORDER — CLOBAZAM 10 MG PO TABS: 10.0000 mg | ORAL_TABLET | Freq: Two times a day (BID) | ORAL | Status: DC

## 2018-02-13 MED ORDER — PROPOFOL 1000 MG/100ML IV EMUL
INTRAVENOUS | Status: AC
  Filled 2018-02-13: qty 100

## 2018-02-13 MED ORDER — LEVETIRACETAM IN NACL 1000 MG/100ML IV SOLN
1000.0000 mg | Freq: Once | INTRAVENOUS | Status: AC
  Administered 2018-02-13: 1000 mg via INTRAVENOUS
  Filled 2018-02-13: qty 100

## 2018-02-13 MED ORDER — CLOBAZAM 10 MG PO TABS: 20.0000 mg | ORAL_TABLET | Freq: Every day | ORAL | Status: DC

## 2018-02-13 MED ORDER — LEVOTHYROXINE SODIUM 25 MCG PO TABS
25.0000 ug | ORAL_TABLET | Freq: Every day | ORAL | Status: DC
  Administered 2018-02-14 – 2018-02-17 (×4): 25 ug via ORAL
  Filled 2018-02-13 (×4): qty 1

## 2018-02-13 MED ORDER — AMOXICILLIN-POT CLAVULANATE 875-125 MG PO TABS
1.0000 | ORAL_TABLET | Freq: Once | ORAL | Status: AC
Start: 2018-02-13 — End: 2018-02-13
  Administered 2018-02-13: 1 via ORAL
  Filled 2018-02-13: qty 1

## 2018-02-13 MED ORDER — LEVETIRACETAM IN NACL 1000 MG/100ML IV SOLN
1000.0000 mg | Freq: Two times a day (BID) | INTRAVENOUS | Status: DC
  Administered 2018-02-14 – 2018-02-16 (×6): 1000 mg via INTRAVENOUS
  Filled 2018-02-13 (×7): qty 100

## 2018-02-13 MED ORDER — PROPOFOL 1000 MG/100ML IV EMUL
0.0000 ug/kg/min | INTRAVENOUS | Status: DC
Start: 2018-02-13 — End: 2018-02-15
  Administered 2018-02-14: 10 ug/kg/min via INTRAVENOUS
  Administered 2018-02-14: 20 ug/kg/min via INTRAVENOUS
  Filled 2018-02-13 (×2): qty 100

## 2018-02-13 MED ORDER — ORAL CARE MOUTH RINSE
15.0000 mL | OROMUCOSAL | Status: DC
  Administered 2018-02-13 – 2018-02-15 (×15): 15 mL via OROMUCOSAL

## 2018-02-13 MED ORDER — SODIUM CHLORIDE 0.9 % IV SOLN
INTRAVENOUS | Status: DC
  Administered 2018-02-15 (×2): via INTRAVENOUS

## 2018-02-13 MED ORDER — LORAZEPAM 2 MG/ML IJ SOLN
INTRAMUSCULAR | Status: AC
  Filled 2018-02-13: qty 1

## 2018-02-13 MED ORDER — ONDANSETRON HCL 4 MG/2ML IJ SOLN
INTRAMUSCULAR | Status: AC
  Filled 2018-02-13: qty 2

## 2018-02-13 MED ORDER — PHENOBARBITAL SODIUM 65 MG/ML IJ SOLN
65.0000 mg | Freq: Once | INTRAMUSCULAR | Status: AC
  Administered 2018-02-13: 65 mg via INTRAVENOUS

## 2018-02-13 MED ORDER — CHLORHEXIDINE GLUCONATE 0.12% ORAL RINSE (MEDLINE KIT)
15.0000 mL | Freq: Two times a day (BID) | OROMUCOSAL | Status: DC
  Administered 2018-02-13 – 2018-02-16 (×5): 15 mL via OROMUCOSAL

## 2018-02-13 MED ORDER — SODIUM CHLORIDE 0.9 % IV SOLN
200.0000 mg | Freq: Once | INTRAVENOUS | Status: AC
  Administered 2018-02-13: 200 mg via INTRAVENOUS
  Filled 2018-02-13: qty 20

## 2018-02-13 MED ORDER — CLOBAZAM 10 MG PO TABS
10.0000 mg | ORAL_TABLET | Freq: Two times a day (BID) | ORAL | Status: DC
  Administered 2018-02-14 – 2018-02-16 (×5): 10 mg
  Filled 2018-02-13 (×5): qty 1

## 2018-02-13 MED ORDER — AMOXICILLIN-POT CLAVULANATE 875-125 MG PO TABS: 1.0000 | ORAL_TABLET | Freq: Two times a day (BID) | ORAL | 0 refills | Status: DC

## 2018-02-13 MED ORDER — PROPOFOL 10 MG/ML IV BOLUS
INTRAVENOUS | Status: AC | PRN
  Administered 2018-02-13: 50 mg via INTRAVENOUS

## 2018-02-13 MED ORDER — POTASSIUM PHOSPHATE MONOBASIC 500 MG PO TABS
500.0000 mg | ORAL_TABLET | Freq: Three times a day (TID) | ORAL | Status: DC
  Filled 2018-02-13 (×2): qty 1

## 2018-02-13 MED ORDER — SODIUM CHLORIDE 0.9 % IV BOLUS
1000.0000 mL | Freq: Once | INTRAVENOUS | Status: AC
  Administered 2018-02-13: 1000 mL via INTRAVENOUS

## 2018-02-13 MED ORDER — PROPOFOL 1000 MG/100ML IV EMUL
5.0000 ug/kg/min | Freq: Once | INTRAVENOUS | Status: AC
  Administered 2018-02-13: 5 ug/kg/min via INTRAVENOUS

## 2018-02-13 MED ORDER — PROPOFOL 10 MG/ML IV BOLUS
INTRAVENOUS | Status: AC
  Filled 2018-02-13: qty 20

## 2018-02-13 MED ORDER — CARBAMAZEPINE 200 MG PO TABS: 200.0000 mg | ORAL_TABLET | Freq: Once | ORAL | Status: DC

## 2018-02-13 MED ORDER — METOCLOPRAMIDE HCL 5 MG/ML IJ SOLN
10.0000 mg | Freq: Once | INTRAMUSCULAR | Status: AC
  Administered 2018-02-13: 10 mg via INTRAVENOUS
  Filled 2018-02-13: qty 2

## 2018-02-13 MED ORDER — CLOBAZAM 10 MG PO TABS
20.0000 mg | ORAL_TABLET | Freq: Every day | ORAL | Status: DC
  Administered 2018-02-13 – 2018-02-15 (×3): 20 mg
  Filled 2018-02-13 (×3): qty 2

## 2018-02-13 MED ORDER — LORAZEPAM 2 MG/ML IJ SOLN
2.0000 mg | Freq: Once | INTRAMUSCULAR | Status: AC
  Administered 2018-02-13: 2 mg via INTRAVENOUS

## 2018-02-13 MED ORDER — FAMOTIDINE IN NACL 20-0.9 MG/50ML-% IV SOLN
20.0000 mg | Freq: Two times a day (BID) | INTRAVENOUS | Status: DC
  Administered 2018-02-14 – 2018-02-15 (×4): 20 mg via INTRAVENOUS
  Filled 2018-02-13 (×4): qty 50

## 2018-02-13 MED ORDER — CLOBAZAM 10 MG PO TABS
10.0000 mg | ORAL_TABLET | Freq: Once | ORAL | Status: AC
  Administered 2018-02-13: 10 mg via ORAL
  Filled 2018-02-13 (×2): qty 1

## 2018-02-13 MED ORDER — ONDANSETRON HCL 4 MG/2ML IJ SOLN
4.0000 mg | Freq: Once | INTRAMUSCULAR | Status: AC
  Administered 2018-02-13: 4 mg via INTRAVENOUS

## 2018-02-13 MED ORDER — CARBAMAZEPINE 100 MG PO CHEW
150.0000 mg | CHEWABLE_TABLET | Freq: Three times a day (TID) | ORAL | Status: DC
  Administered 2018-02-13 – 2018-02-15 (×5): 150 mg via ORAL
  Filled 2018-02-13 (×6): qty 1.5

## 2018-02-13 MED ORDER — SODIUM CHLORIDE 0.9 % IV SOLN
1.0000 g | Freq: Once | INTRAVENOUS | Status: AC
  Administered 2018-02-13: 1 g via INTRAVENOUS
  Filled 2018-02-13: qty 10

## 2018-02-13 MED ORDER — SODIUM CHLORIDE 0.9 % IV SOLN
500.0000 mg | Freq: Once | INTRAVENOUS | Status: AC
  Administered 2018-02-13: 500 mg via INTRAVENOUS
  Filled 2018-02-13: qty 500

## 2018-02-13 MED ORDER — ROCURONIUM BROMIDE 50 MG/5ML IV SOLN
1.0000 mg/kg | Freq: Once | INTRAVENOUS | Status: DC
Start: 2018-02-13 — End: 2018-02-16
  Filled 2018-02-13: qty 5.9

## 2018-02-13 MED ORDER — TOPIRAMATE ER 25 MG PO SPRINKLE CAP24
250.0000 mg | EXTENDED_RELEASE_CAPSULE | Freq: Every day | ORAL | Status: DC
  Administered 2018-02-13 – 2018-02-16 (×4): 250 mg
  Filled 2018-02-13 (×6): qty 2

## 2018-02-13 NOTE — Discharge Instructions (Addendum)
Take augmentin twice daily for a week.   Stay hydrated.   Continue your current seizure meds   See Dr. Gaynell Face and your primary care doctor  Return to ER if he has fever, another seizure, prolonged post ictal period, trouble breathing.

## 2018-02-13 NOTE — ED Notes (Signed)
Called into room foe pt having a seizure , lasting approx 3 mins , pt placed on NRB and given 2mg  ativan ivp

## 2018-02-13 NOTE — ED Triage Notes (Signed)
Pt here with c/ol seizures lasting approx 9 mins , no meds given , no seizures upon arrival

## 2018-02-13 NOTE — Progress Notes (Signed)
RT NOTE:  Pt transported to 4N21 without event, Report given to Judson Roch, RRT.

## 2018-02-13 NOTE — ED Notes (Signed)
Attempted to get pt to eat and drink after nausea subsided , pt unable to keep food or drink down  Dr Laverta Baltimore notified

## 2018-02-13 NOTE — Consult Note (Signed)
Requesting Physician: Dr. Laverta Baltimore    Chief Complaint:   History obtained from: Patient and Chart     HPI:                                                                                                                                       [name] is an 32 y.o. male with refractory partial epilepsy with secondary generalization, postictal and non-ictal migraines, early onset cataracts, intellectual disability, mitral regurgitation, calcifications in his basal ganglia noted on MRI scan, and obstructive sleep apnea. He is on 5 different seizure medications, sees Dr. Gaynell Face for management of his seizures.  Recently Keppra dose was reduced from thousand grams twice daily to 500 mg twice daily.  He often has breakthrough seizures, however today he had a seizure the last for 8 minutes. They administer Ativan however this lasted longer than usual and he also  stopped breathing.  Parents called EMS and patient was brought to [facility], ER.  Chest x-ray showed possible pneumonia and patient was started on Augmentin.  Loaded with any seizure medications.  He was observed for 2 hours and was given his home meds by mouth.  He unfortunately ended up vomiting the medications and shortly started to have seizures.  After his seizure, he was loaded with a gram of Keppra and 2 mg of Ativan.  Neurology was consulted.  Seemed Keppra when I went to assess the patient, however he was not seizing.  Requested nurse to administer Keppra as soon as possible-however as the medication was just started the patient started having a second seizure.  Additional 2 mg of Ativan was given however the patient continued to have more seizures with forced gaze deviation to the right.  Intermittently would wake up and follow commands.  His breathing also became more rapid, and he was tachycardic.  Recommended to place NG tube to give home meds as they have as some of them are not available in IV formulation.  Ultimately  decided by ED doc and myself to intubate the patient.     Past Medical History:  Diagnosis Date  . ALLERGIC RHINITIS 12/26/2009  . Blood clot in vein   . Brown's syndrome   . Growth disorder   . Headache   . Heart murmur    moderate MR  . Hyperlipidemia    No therapy  . Hypokalemia   . Mental retardation, mild (I.Q. 50-70)   . MITRAL VALVE PROLAPSE 06/22/2007  . SEIZURE DISORDER 06/22/2007  . Sleep apnea    cpap , last sleep study 10/01/11  . Unspecified hypothyroidism 06/22/2007    Past Surgical History:  Procedure Laterality Date  . ANKLE SURGERY Left   . EYE SURGERY     X2 for Brown Syndrome  . IMPLANTATION VAGAL NERVE STIMULATOR     Left  . PORTACATH PLACEMENT     and removal 04/08/12  Family History  Problem Relation Age of Onset  . Breast cancer Mother   . Colon polyps Mother   . Clotting disorder Mother   . Diabetes Father   . Hypertension Father   . Colon polyps Father   . Hyperlipidemia Father   . Coronary artery disease Maternal Grandfather   . Congestive Heart Failure Maternal Grandfather        Died at 44  . Pneumonia Maternal Grandfather   . Coronary artery disease Paternal Grandfather   . Lung cancer Paternal Grandfather        Died at 65  . Pneumonia Paternal Grandmother        Died at 56  . Stroke Paternal Grandmother    Social History:  reports that he has never smoked. He has never used smokeless tobacco. He reports that he does not drink alcohol or use drugs.  Allergies:  Allergies  Allergen Reactions  . Antihistamines, Chlorpheniramine-Type Other (See Comments)    Other Reaction: Dimetapp causes seizures  . Divalproex Sodium Other (See Comments)    Nausea, vomiting, liver and kidney dysfunction  . Phenytoin Rash and Other (See Comments)    Dilantin causes more seizures.   . Valproic Acid And Related Other (See Comments)    Shuts down systems  . Vancomycin Rash, Other (See Comments) and Swelling    If given too fast, causes red man  reaction Other Reaction: Redman's Syndrome    Medications:                                                                                                                        I reviewed home medications   ROS:                                                                                                                                     14 systems reviewed and negative except above    Examination:  General: Small for age Psych: Affect appropriate to situation Eyes: No scleral injection HENT: No OP obstrucion Head: microcephalic Cardiovascular: Normal rate and regular rhythm.  Respiratory: Increased effort of breathing GI: Soft.  No distension. There is no tenderness.  Skin: WDI   Neurological Examination Mental Status: Patient intermittently following commands.  Was not speaking.  Cranial Nerves: II: Visual fields : Difficult to assess Perez,IV, VI: Forced gaze deviation to the right side briefly, return to center.  No nystagmus V,VII: smile symmetric  Motor: Right : Upper extremity   3/5    Left:     Upper extremity   3/5  Lower extremity   2/5     Lower extremity   2/5 Tone and bulk:normal tone throughout; no atrophy noted Sensory: Pinprick and light touch intact throughout, bilaterally Deep Tendon Reflexes: 2+ and symmetric throughout Plantars: Right: downgoing   Left: downgoing Cerebellar: Able to assess      Lab Results: Basic Metabolic Panel: Recent Labs  Lab 02/13/18 1419  NA 139  K 3.3*  CL 108  CO2 19*  GLUCOSE 133*  BUN 9  CREATININE 1.18  CALCIUM 8.3*    CBC: Recent Labs  Lab 02/13/18 1419  WBC 10.6*  NEUTROABS 7.2  HGB 12.8*  HCT 40.5  MCV 85.6  PLT 233    Coagulation Studies: No results for input(s): LABPROT, INR in the last 72 hours.  Imaging: Dg Chest Port 1 View  Result Date: 02/13/2018 CLINICAL DATA:  Cough, seizure,  shortness of breath. EXAM: PORTABLE CHEST 1 VIEW COMPARISON:  06/12/2016 FINDINGS: Right subclavian approach injectable port and pacing device in stable position. Cardiomediastinal silhouette is normal. Mediastinal contours appear intact. There is no evidence of pleural effusion or pneumothorax. Mild peribronchial opacities in bilateral lower lobes. Osseous structures are without acute abnormality. Soft tissues are grossly normal. IMPRESSION: Mild peribronchial opacities in bilateral lower lobes may represent bronchitis or early developing bronchopneumonia. Electronically Signed   By: Fidela Salisbury M.D.   On: 02/13/2018 13:51     ASSESSMENT AND PLAN  32 y.o. male with refractory partial epilepsy with secondary generalization on 5 different epilepsy medications with frequent breakthrough seizures presents with prolonged seizure with apnea. Patient is diagnosed to have pneumonia-had multiple seizures in the ER.  Intubated for status epilepticus. Home meds :[name], para, Vimpat, Trokendi, Tegretol.  Also has Valium as needed Ativan as needed and phenobarbital as needed   Plan Intubated for airway protection Loaded with 200 mg of IV Vimpat Loaded with IV phenobarbital Placed NG tube and resume home medications Neurochecks Reassess if having more seizures will need video EEG     This patient is neurologically critically ill due to status epilepticus/  He is at risk for significant risk of neurological worsening from seizures, cerebral edema, , heart failure, hemorrhagic conversion, infection, respiratory failure. This patient's care requires constant monitoring of vital signs, hemodynamics, respiratory and cardiac monitoring, review of multiple databases, neurological assessment, discussion with family, other specialists and medical decision making of high complexity.  I spent  75 minutes of neurocritical time in the care of this patient.     Maty Zeisler [facility] Pager  Number [phone]

## 2018-02-13 NOTE — ED Notes (Signed)
Pt had second seizure lasting 45 sec ativan 2mg  given , Neuro MD at bedside

## 2018-02-13 NOTE — ED Notes (Signed)
Upon attempting to d/c pt , pt vomited up all his meds, MD notified , zofran given

## 2018-02-13 NOTE — H&P (Addendum)
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 Patient had a seizure that lasted for 6-8 mins as per mother today while watching TV and almost stopped breathing and she started giving him rescue breaths. He was brought to the ED where he had another seizure and had to be intubated. She states that he usually gets 2-3 seizures a month but not this severe and last time he had this severe seizure was in 2017 and required intubation. No fever no chills no rigors. He is compliant to his medications. He does have a port and they occasionally access it to give IV meds if needed to break seizures. They did not do that this time. I was called after neurology were consulted and patient is already intubated and was not able to get any history from him obviously.     PAST MEDICAL HISTORY :  He  has a past medical history of ALLERGIC RHINITIS (12/26/2009), Blood clot in vein, Brown's syndrome, Growth disorder, Headache, Heart murmur, Hyperlipidemia, Hypokalemia, Mental retardation, mild (I.Q. 50-70), MITRAL VALVE PROLAPSE (06/22/2007), SEIZURE DISORDER (06/22/2007), Sleep apnea, and Unspecified hypothyroidism (06/22/2007).  PAST SURGICAL HISTORY: He  has a past surgical history that includes Implantation vagal nerve stimulator; Portacath placement; Eye surgery; and Ankle surgery (Left).  Allergies  Allergen Reactions  . Antihistamines,  Chlorpheniramine-Type Other (See Comments)    Other Reaction: Dimetapp causes seizures  . Divalproex Sodium Other (See Comments)    Nausea, vomiting, liver and kidney dysfunction  . Phenytoin Rash and Other (See Comments)    Dilantin causes more seizures.   . Valproic Acid And Related Other (See Comments)    Shuts down systems  . Vancomycin Rash, Other (See Comments) and Swelling    If given too fast, causes red man reaction Other Reaction: Redman's Syndrome    No current facility-administered medications on file prior to encounter.    Current Outpatient Medications on File Prior to Encounter  Medication Sig  . acetaminophen (TYLENOL) 500 MG tablet Take 1,000 mg by mouth every 6 (six) hours as needed for headache.  . carbamazepine (TEGRETOL) 100 MG chewable tablet Chew 2 tablets in the morning, 2 tablets at midday and 1+1/2 tablets in the evening (Patient taking differently: Chew 150-200 mg by mouth See admin instructions. Chew 2 tablets(200mg ) every morning and every afternoon and 1 1/2 tablets (150 mg) every night)  . cetirizine (ZYRTEC) 10 MG tablet Take 10 mg by mouth daily.  . diazepam (VALIUM) 2 MG tablet Take 1 tablet every 6 - 8 hours as needed for anxiety or seizures (Patient taking differently: Take 2 mg by mouth every 6 (six) hours as needed for anxiety (seizures). )  . Flaxseed, Linseed, (FLAXSEED OIL) 1000 MG CAPS Take 1,000 mg by mouth daily.  Marland Kitchen guaiFENesin (MUCINEX) 600 MG 12 hr tablet Take 600 mg by mouth 2 (two) times daily.  . hydrocortisone (ANUSOL-HC) 25 MG suppository Place 1 suppository (25 mg total) rectally at bedtime. (Patient taking differently: Place 25 mg rectally at  bedtime as needed for hemorrhoids or anal itching. )  . Lacosamide (VIMPAT) 100 MG TABS Take 100 mg by mouth 2 (two) times daily.  Marland Kitchen levETIRAcetam (KEPPRA) 500 MG tablet Take 500 mg by mouth 2 (two) times daily. Must be BRAND NAME  . levothyroxine (SYNTHROID, LEVOTHROID) 25 MCG tablet Take 25 mcg by  mouth daily before breakfast.   . LORazepam (ATIVAN) 2 MG/ML concentrated solution Withdraw 21ml (2mg ) and 82ml  Normal saline into 3 ml syringe. Administer IV push over 3-5 minutes as needed for recurrent seizures (Patient taking differently: 2 mg See admin instructions. Withdraw 82ml (2mg ) and 22ml  Normal saline into 3 ml syringe. Administer IV push over 3-5 minutes as needed for recurrent seizures)  . montelukast (SINGULAIR) 10 MG tablet Take 10 mg by mouth at bedtime.  . Multiple Vitamin (MULTIVITAMIN WITH MINERALS) TABS Take 1 tablet by mouth daily.  . Omega-3 Fatty Acids (OMEGA 3 PO) Take 520 mg by mouth daily.  . ondansetron (ZOFRAN-ODT) 4 MG disintegrating tablet Take 1 tablet as needed for nausea and vomiting (Patient taking differently: Take 4 mg by mouth every 8 (eight) hours as needed (nausea and vomiting related to migraines). )  . [name] 10 MG tablet TAKE 1 TABLET BY MOUTH IN THE MORNING, 1 TAB IN THE AFTERNOON AND 2 TABLETS AT BEDTIME  . PHENObarbital (LUMINAL) 65 MG/ML injection Withdraw 63ml (65mg ) Phenobarbital and 1ml Normal Saline into 43ml syringe. Adminster IV push for 3-5 minutes as needed for recurrent seizures (Patient taking differently: 65 mg See admin instructions. Withdraw 70ml (65mg ) Phenobarbital and 1ml Normal Saline into 22ml syringe. Adminster IV push for 3-5 minutes as needed for recurrent seizures)  . potassium phosphate, monobasic, (K-PHOS) 500 MG tablet TAKE 1 TABLET (500mg ) BY MOUTH FOUR TIMES A DAY (Patient taking differently: Take 500 mg by mouth 4 (four) times daily. )  . promethazine (PHENERGAN) 25 MG suppository Place 1 suppository (25 mg total) rectally every 6 (six) hours as needed for nausea or vomiting.  . rosuvastatin (CRESTOR) 10 MG tablet Take 10 mg by mouth at bedtime.  . Topiramate ER (TROKENDI XR) 200 MG CP24 Take 1 capsule by mouth at bedtime (Patient taking differently: Take 200 mg by mouth See admin instructions. Take 1 capsule (200 mg) by mouth with a 50 mg  capsule daily at bedtime (total dose 250 mg))  . Topiramate ER (TROKENDI XR) 50 MG CP24 Take 2 capsules by mouth at bedtime. (Patient taking differently: Take 50 mg by mouth See admin instructions. Take 1 capsule (50 mg) by mouth with a 200 mg capsule daily at bedtime (total dose 250 mg))    FAMILY HISTORY:  His indicated that his mother is alive. He indicated that his father is alive. He indicated that his sister is alive. He indicated that his maternal grandmother is alive. He indicated that his maternal grandfather is deceased. He indicated that his paternal grandmother is deceased. He indicated that his paternal grandfather is deceased. He indicated that both of his others are alive.   SOCIAL HISTORY: He  reports that he has never smoked. He has never used smokeless tobacco. He reports that he does not drink alcohol or use drugs.  REVIEW OF SYSTEMS:   Could not be obtained due to altered mental status     VITAL SIGNS: BP 105/72   Pulse (!) 129   Resp 14   Ht 5\' 3"  (1.6 m)   Wt 59 kg (130 lb 1.1 oz)   SpO2 100%  BMI 23.04 kg/m   HEMODYNAMICS:    VENTILATOR SETTINGS: Vent Mode: PRVC FiO2 (%):  [100 %] 100 % Set Rate:  [14 bmp] 14 bmp Vt Set:  [450 mL] 450 mL PEEP:  [5 cmH20] 5 cmH20 Plateau Pressure:  [20 cmH20] 20 cmH20  INTAKE / OUTPUT: No intake/output data recorded.  PHYSICAL EXAMINATION: General:  Sedated looks younger than his age.  Neuro: sedated pupil 84mm reactive  HEENT: intubated  Cardiovascular:  normal heart sounds  Lungs:  Clear equal air sounds no wheezing  Abdomen:  Soft no tenderness  Musculoskeletal:  No edema Skin:  Dilated veins on the chest   LABS:  BMET Recent Labs  Lab 02/13/18 1419  NA 139  K 3.3*  CL 108  CO2 19*  BUN 9  CREATININE 1.18  GLUCOSE 133*    Electrolytes Recent Labs  Lab 02/13/18 1419  CALCIUM 8.3*    CBC Recent Labs  Lab 02/13/18 1419  WBC 10.6*  HGB 12.8*  HCT 40.5  PLT 233    Coag's No results  for input(s): APTT, INR in the last 168 hours.  Sepsis Markers No results for input(s): LATICACIDVEN, PROCALCITON, O2SATVEN in the last 168 hours.  ABG No results for input(s): PHART, PCO2ART, PO2ART in the last 168 hours.  Liver Enzymes Recent Labs  Lab 02/13/18 1419  AST 52*  ALT 44  ALKPHOS 81  BILITOT 0.6  ALBUMIN 3.5    Cardiac Enzymes No results for input(s): TROPONINI, PROBNP in the last 168 hours.  Glucose No results for input(s): GLUCAP in the last 168 hours.  Imaging Dg Chest Portable 1 View  Result Date: 02/13/2018 CLINICAL DATA:  Hypoxia EXAM: PORTABLE CHEST 1 VIEW COMPARISON:  Feb 13, 2018 FINDINGS: Endotracheal tube tip is 2.4 cm above the carina. Nasogastric tube tip and side port in stomach. [name] tip is in the superior vena cava near the cavoatrial junction, stable. A stimulator is seen on the left with leads extending into the neck region. No pneumothorax. There is atelectatic change in the left lung base. Lungs elsewhere clear. Heart is upper normal in size with pulmonary vascularity within normal limits. No adenopathy. No bone lesions. IMPRESSION: Tube and catheter positions as described without pneumothorax. Left base atelectasis. Lungs elsewhere clear. Stable cardiac silhouette. Electronically Signed   By: Lowella Grip [name] M.D.   On: 02/13/2018 18:24   Dg Chest Port 1 View  Result Date: 02/13/2018 CLINICAL DATA:  Cough, seizure, shortness of breath. EXAM: PORTABLE CHEST 1 VIEW COMPARISON:  06/12/2016 FINDINGS: Right subclavian approach injectable port and pacing device in stable position. Cardiomediastinal silhouette is normal. Mediastinal contours appear intact. There is no evidence of pleural effusion or pneumothorax. Mild peribronchial opacities in bilateral lower lobes. Osseous structures are without acute abnormality. Soft tissues are grossly normal. IMPRESSION: Mild peribronchial opacities in bilateral lower lobes may represent bronchitis or  early developing bronchopneumonia. Electronically Signed   By: Fidela Salisbury M.D.   On: 02/13/2018 13:51      ASSESSMENT / PLAN:  Status epilepticus  Acute respiratory insufficiency requiring mechanical ventilation  Hypokalemia Atelectasis    Plan: - sedate with propofol - we appreciate neuro input and restarting appropriate  seizure meds - EEG - vent bundle  - replace potasium  - I will hold off on starting ABx ? atelectasis but will have low threshold  - DVT prophylaxis - PPI for GI prophylaxis  - resume home thyroxine  - NS 68ml/hr - NPO other than meds  I have spent 40 mins of CC time bedside or in the unit exclusive of billable procedures. Patient is needing ICU due to status epilepticus and resp failure   FAMILY  - Updates: mother updated at bedside        Pulmonary and [place] Pager: [phone]  02/13/2018, 6:55 PM

## 2018-02-13 NOTE — ED Provider Notes (Signed)
[facility] EMERGENCY DEPARTMENT Provider Note   CSN: [id number] Arrival date & time: 02/13/18  1320     History   Chief Complaint Chief Complaint  Patient presents with  . Seizures    HPI [name] is a 32 y.o. male history of mental retardation, seizure, here with seizure, post ictal period. Patient had a seizure 2 days ago and was given Ativan and it resolved.  Yesterday, he had another episode of seizure.  Today, he was watching TV and parents noted that he had tonic-clonic seizure-like activity.  They immediately gave him some Ativan and it lasted about 8 minutes which is more than usual.  He also stopped breathing at that time and parents briefly given some ventilations.  Parents then called EMS and brought him to the ED.  No meds given by EMS.   The history is provided by the patient and the EMS personnel.    Past Medical History:  Diagnosis Date  . ALLERGIC RHINITIS 12/26/2009  . Blood clot in vein   . Brown's syndrome   . Growth disorder   . Headache   . Heart murmur    moderate MR  . Hyperlipidemia    No therapy  . Hypokalemia   . Mental retardation, mild (I.Q. 50-70)   . MITRAL VALVE PROLAPSE 06/22/2007  . SEIZURE DISORDER 06/22/2007  . Sleep apnea    cpap , last sleep study 10/01/11  . Unspecified hypothyroidism 06/22/2007    Patient Active Problem List   Diagnosis Date Noted  . CAP (community acquired pneumonia) 06/12/2016  . Anemia 06/12/2016  . 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 Encounter for feeding tube placement   . Cardiac arrest ([place]) 11/03/2015  . Acute respiratory failure ([place])   . Acute respiratory failure with hypercapnia ([place])   . Impaired oral gastric feeding tube   . Respiratory arrest ([place]) 09/29/2015  . Seizure ([place]) 09/29/2015  . 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 Encounter for therapeutic drug monitoring 10/24/2013  . Hypokalemia 08/03/2013  .  Hypothyroidism 08/02/2013  . Subtherapeutic international normalized ratio (INR) 08/02/2013  . Contracture of ankle and foot joint 03/29/2013  . Abnormality of gait 12/21/2012  . 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[place]) 12/21/2012  . Partial epilepsy with intractable epilepsy ([place]) 12/21/2012  . Obstructive sleep apnea 12/21/2012  . Acute venous embolism and thrombosis of subclavian veins ([place]) 12/21/2012  . Acute venous embolism and thrombosis of internal jugular veins ([place]) 12/21/2012  . 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 Loss of weight 12/21/2012  . Encounter for long-term (current) use of other medications 12/21/2012  . Screening for lipoid disorders 12/21/2012  . Fall resulting in striking against other object 12/21/2012  . Mechanical complication of other vascular device, implant, and graft 12/21/2012  . 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 Pain in soft tissues of limb 11/01/2012  . Long term (current) use of anticoagulants 05/21/2012  . Aspiration pneumonia ([place]) 05/17/2012  . Diarrhea 05/17/2012  . [name] 05/17/2012  . Mitral regurgitation 04/29/2012  . A-fib ([place]) 04/08/2012  . Status epilepticus ([place]) 04/08/2012  . Generalized convulsive epilepsy ([place]) 01/08/2012    Class: Acute  . 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 Partial epilepsy with impairment of consciousness ([place]) 01/08/2012    Class: Chronic  . Irregular heart beat   . DVT of upper extremity (deep vein thrombosis) ([place])  09/04/2011  . 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 Allergic rhinitis 12/26/2009  . MITRAL VALVE PROLAPSE 06/22/2007  . Seizure disorder ([place]) 06/22/2007    Past Surgical History:  Procedure Laterality Date  . ANKLE SURGERY Left   . EYE SURGERY     X2 for Brown Syndrome  . IMPLANTATION VAGAL NERVE STIMULATOR     Left  . PORTACATH PLACEMENT     and removal 04/08/12         Home Medications    Prior to Admission medications   Medication Sig Start Date End Date Taking? Authorizing Provider  acetaminophen (TYLENOL) 500 MG tablet Take 1,000 mg by mouth every 6 (six) hours as needed for headache.    [provider]  carbamazepine (TEGRETOL) 100 MG chewable tablet Chew 2 tablets in the morning, 2 tablets at midday and 1+1/2 tablets in the evening Patient taking differently: Chew 150-200 mg by mouth See admin instructions. Chew 200mg  in the morning, 200mg  at midday and 150mg  in the evening 12/24/16   Jodi Geralds, MD  cetirizine (ZYRTEC) 10 MG tablet Take 10 mg by mouth daily. 02/12/15   [provider]  diazepam (VALIUM) 2 MG tablet Take 1 tablet every 6 - 8 hours as needed for anxiety or seizures Patient taking differently: Take 2 mg by mouth every 6 (six) hours as needed for anxiety (seizures).  12/24/16   Jodi Geralds, MD  guaifenesin (HUMIBID E) 400 MG TABS tablet Take 400 mg by mouth 2 (two) times daily.    [provider]  hydrocortisone (ANUSOL-HC) 25 MG suppository Place 1 suppository (25 mg total) rectally at bedtime. 01/11/18   Mauri Pole, MD  levETIRAcetam (KEPPRA) 500 MG tablet Take 500 mg by mouth 2 (two) times daily.    [provider]  levothyroxine (SYNTHROID, LEVOTHROID) 25 MCG tablet Take 25 mcg by mouth daily before breakfast.     [provider]  LORazepam (ATIVAN) 2 MG/ML concentrated solution Withdraw 85ml (2mg ) and 25ml  Normal saline into 3 ml syringe. Administer IV push over 3-5 minutes as needed for recurrent seizures 10/28/17   Jodi Geralds, MD  Multiple Vitamin (MULTIVITAMIN WITH MINERALS) TABS Take 1 tablet by mouth daily.    [provider]  ondansetron (ZOFRAN-ODT) 4 MG disintegrating tablet Take 1 tablet as needed for nausea and vomiting 09/27/15   Jodi Geralds, MD  [name] 10 MG tablet TAKE 1 TABLET BY MOUTH IN THE MORNING, 1 TAB IN THE AFTERNOON AND 2  TABLETS AT BEDTIME 09/21/17   Rockwell Germany, NP  PHENObarbital (LUMINAL) 65 MG/ML injection Withdraw 19ml (65mg ) Phenobarbital and 60ml Normal Saline into 69ml syringe. Adminster IV push for 3-5 minutes as needed for recurrent seizures 10/28/17   Jodi Geralds, MD  potassium phosphate, monobasic, (K-PHOS) 500 MG tablet TAKE 1 TABLET (500mg ) BY MOUTH FOUR TIMES A DAY 12/10/17   Jodi Geralds, MD  promethazine (PHENERGAN) 25 MG suppository Place 1 suppository (25 mg total) rectally every 6 (six) hours as needed for nausea or vomiting. 08/18/17   Little, Wenda Overland, MD  rosuvastatin (CRESTOR) 10 MG tablet Take 10 mg by mouth at bedtime. 05/26/16   [provider]  Topiramate ER (TROKENDI XR) 200 MG CP24 Take 1 capsule by mouth at bedtime Patient taking differently: Take 200 mg by mouth at bedtime. Take with 50mg  tab to equal 250mg (total) 12/24/16   Jodi Geralds, MD  Topiramate ER (TROKENDI XR) 50 MG CP24 Take 2 capsules by  mouth at bedtime. Patient taking differently: Take 50 mg by mouth at bedtime. Take with 200mg  tab to equal 250mg (total) 12/24/16   Jodi Geralds, MD  Topiramate ER (TROKENDI XR) 50 MG CP24 Take 1 tablet by mouth at bedtime.    [provider]    Family History Family History  Problem Relation Age of Onset  . Breast cancer Mother   . Colon polyps Mother   . Clotting disorder Mother   . Diabetes Father   . Hypertension Father   . Colon polyps Father   . Hyperlipidemia Father   . Coronary artery disease Maternal Grandfather   . Congestive Heart Failure Maternal Grandfather        Died at 90  . Pneumonia Maternal Grandfather   . Coronary artery disease Paternal Grandfather   . Lung cancer Paternal Grandfather        Died at 50  . Pneumonia Paternal Grandmother        Died at 47  . Stroke Paternal Grandmother     Social History Social History   Tobacco Use  . Smoking status: Never Smoker  . Smokeless tobacco: Never Used   Substance Use Topics  . Alcohol use: No    Alcohol/week: 0.0 oz  . Drug use: No     Allergies   Antihistamines, chlorpheniramine-type; Divalproex sodium; Phenytoin; Valproic acid and related; and Vancomycin   Review of Systems Review of Systems  Respiratory: Positive for cough.   Neurological: Positive for seizures.  All other systems reviewed and are negative.    Physical Exam Updated Vital Signs There were no vitals taken for this visit.  Physical Exam  Constitutional:  Chronically ill   HENT:  Head: Normocephalic.  Mouth/Throat: Oropharynx is clear and moist.  Eyes: Pupils are equal, round, and reactive to light. Conjunctivae and EOM are normal.  Neck: Normal range of motion. Neck supple.  Cardiovascular: Normal rate, regular rhythm and normal heart sounds.  Pulmonary/Chest:  Diminished bilateral bases   Abdominal: Soft. Bowel sounds are normal. He exhibits no distension. There is no tenderness. There is no guarding.  Musculoskeletal: Normal range of motion.  Neurological:  Sleepy, baseline tremors (chronic per family), no obvious tonic clonic seizure activity.   Skin: Skin is warm.  Psychiatric: He has a normal mood and affect.  Nursing note and vitals reviewed.    ED Treatments / Results  Labs (all labs ordered are listed, but only abnormal results are displayed) Labs Reviewed  CBC WITH DIFFERENTIAL/PLATELET - Abnormal; Notable for the following components:      Result Value   WBC 10.6 (*)    Hemoglobin 12.8 (*)    All other components within normal limits  COMPREHENSIVE METABOLIC PANEL - Abnormal; Notable for the following components:   Potassium 3.3 (*)    CO2 19 (*)    Glucose, Bld 133 (*)    Calcium 8.3 (*)    AST 52 (*)    All other components within normal limits    EKG EKG Interpretation  Date/Time:  Saturday Feb 13 2018 13:27:31 EDT Ventricular Rate:  128 PR Interval:    QRS Duration: 94 QT Interval:  353 QTC Calculation: 516 R  Axis:   59 Text Interpretation:  Sinus tachycardia Nonspecific repol abnormality, diffuse leads Prolonged QT interval Since last tracing rate faster Confirmed by Wandra Arthurs ([id number]) on 02/13/2018 1:31:59 PM   Radiology Dg Chest Port 1 View  Result Date: 02/13/2018 CLINICAL DATA:  Cough, seizure, shortness of  breath. EXAM: PORTABLE CHEST 1 VIEW COMPARISON:  06/12/2016 FINDINGS: Right subclavian approach injectable port and pacing device in stable position. Cardiomediastinal silhouette is normal. Mediastinal contours appear intact. There is no evidence of pleural effusion or pneumothorax. Mild peribronchial opacities in bilateral lower lobes. Osseous structures are without acute abnormality. Soft tissues are grossly normal. IMPRESSION: Mild peribronchial opacities in bilateral lower lobes may represent bronchitis or early developing bronchopneumonia. Electronically Signed   By: Fidela Salisbury M.D.   On: 02/13/2018 13:51    Procedures Procedures (including critical care time)  Medications Ordered in ED Medications  amoxicillin-clavulanate (AUGMENTIN) 875-125 MG per tablet 1 tablet (has no administration in time range)  sodium chloride 0.9 % bolus 1,000 mL (1,000 mLs Intravenous New Bag/Given 02/13/18 1358)     Initial Impression / Assessment and Plan / ED Course  I have reviewed the triage vital signs and the nursing notes.  Pertinent labs & imaging results that were available during my care of the patient were reviewed by me and considered in my medical decision making (see chart for details).     Terril Chestnut Aday Perez is a 32 y.o. male here with prolonged seizure with post ictal period. Patient is on multiple seizure meds and parents give them to him as prescribed every day and he didn't miss any doses. He had some cough after this current episode. Consider aspiration pneumonia. He is afebrile and is alert and back to baseline. Will get labs, CXR. Will observe.   3:31 PM Observed  for 2 hrs in the ED. No seizure activity. Mental status remained baseline. WBC 10. CXR showed possible pneumonia. Parents states that he usually gets augmentin for it. Will dc home with course of augmentin. Will have him follow up with neurologist, Dr. Gaynell Face. He will need to continue his current meds.    Final Clinical Impressions(s) / ED Diagnoses   Final diagnoses:  None    ED Discharge Orders    None       Drenda Freeze, MD 02/13/18 [phone]

## 2018-02-13 NOTE — ED Provider Notes (Addendum)
Blood pressure 104/60, pulse (!) 119, resp. rate 20, SpO2 98 %.  Assuming care from Dr. Darl Householder.  In short, [name] is a 32 y.o. male with a chief complaint of Seizures .  Refer to the original H&P for additional details.  04:29 PM At time of discharge patient began vomiting.  He vomited up his antibiotics.  He was given Zofran and IV fluids but continues to have vomiting with even minimal PO.  Will give antibiotics IV.  Added Reglan.  Will reassess after medications.  05:27 PM Called to patient bedside with patient having seizure.  Administered 2 mg of Ativan and placed on nonrebreather.  Patient is protecting his airway.  Family tells me that they recently decreased his Keppra from 1000 mg twice daily to 500 mg twice daily.  At home for recurrent seizures he takes 65 mg of Phenobarbital.  Will administer these in addition and consult neurology with continued seizures. Patient will require admission at this point.   Spoke with Dr. Londell Moh who will consult on the patient. Agrees with admit decision and loading meds ordered.   Discussed patient's case with Internal Medicine teaching service to request admission. Patient and family (if present) updated with plan. Care transferred to medicine service.  I reviewed all nursing notes, vitals, pertinent old records, EKGs, labs, imaging (as available).   Called to patient bedside with additional seizure activity. Unable to attempt NG tube placement without securing airway at this point. Discussed plan to intubate with the parents at bedside who are in agreement with plan. Will intubate. See note below.   Spoke with critical care post-intubation. They will admit.   CRITICAL CARE Performed by: Margette Fast Total critical care time: 55 minutes Critical care time was exclusive of separately billable procedures and treating other patients. Critical care was necessary to treat or prevent imminent or life-threatening deterioration. Critical  care was time spent personally by me on the following activities: development of treatment plan with patient and/or surrogate as well as nursing, discussions with consultants, evaluation of patient's response to treatment, examination of patient, obtaining history from patient or surrogate, ordering and performing treatments and interventions, ordering and review of laboratory studies, ordering and review of radiographic studies, pulse oximetry and re-evaluation of patient's condition.  Nanda Quinton, MD Emergency Medicine  Date/Time: 02/13/2018 6:38 PM Performed by: Margette Fast, MD Pre-anesthesia Checklist: Patient identified, Emergency Drugs available, Suction available, Patient being monitored and Timeout performed Oxygen Delivery Method: Non-rebreather mask Preoxygenation: Pre-oxygenation with 100% oxygen Induction Type: Rapid sequence Laryngoscope Size: Glidescope and 3 Grade View: Grade Perez Tube size: 7.5 mm Number of attempts: 1 Airway Equipment and Method: Video-laryngoscopy Placement Confirmation: ETT inserted through vocal cords under direct vision,  CO2 detector and Breath sounds checked- equal and bilateral Secured at: 22 cm Tube secured with: ETT holder Dental Injury: Teeth and Oropharynx as per pre-operative assessment       Larance Ratledge, [facility], MD 02/13/18 1751    Margette Fast, MD 02/13/18 1842

## 2018-02-14 ENCOUNTER — Other Ambulatory Visit: Payer: Self-pay

## 2018-02-14 DIAGNOSIS — R569 Unspecified convulsions: Secondary | ICD-10-CM

## 2018-02-14 LAB — CBC
HCT: 35.8 % — ABNORMAL LOW (ref 39.0–52.0)
HEMOGLOBIN: 11.2 g/dL — AB (ref 13.0–17.0)
MCH: 26.9 pg (ref 26.0–34.0)
MCHC: 31.3 g/dL (ref 30.0–36.0)
MCV: 86.1 fL (ref 78.0–100.0)
Platelets: 203 10*3/uL (ref 150–400)
RBC: 4.16 MIL/uL — AB (ref 4.22–5.81)
RDW: 14.2 % (ref 11.5–15.5)
WBC: 8.9 10*3/uL (ref 4.0–10.5)

## 2018-02-14 LAB — BASIC METABOLIC PANEL
ANION GAP: 10 (ref 5–15)
BUN: 6 mg/dL (ref 6–20)
CHLORIDE: 107 mmol/L (ref 101–111)
CO2: 21 mmol/L — AB (ref 22–32)
Calcium: 7.4 mg/dL — ABNORMAL LOW (ref 8.9–10.3)
Creatinine, Ser: 0.96 mg/dL (ref 0.61–1.24)
GFR calc non Af Amer: >60 mL/min (ref >60)
Glucose, Bld: 117 mg/dL — ABNORMAL HIGH (ref 65–99)
POTASSIUM: 3.2 mmol/L — AB (ref 3.5–5.1)
SODIUM: 138 mmol/L (ref 135–145)

## 2018-02-14 LAB — HIV ANTIBODY (ROUTINE TESTING W REFLEX): HIV Screen 4th Generation wRfx: NONREACTIVE

## 2018-02-14 LAB — TRIGLYCERIDES: TRIGLYCERIDES: 187 mg/dL — AB (ref <150)

## 2018-02-14 MED ORDER — ACETAMINOPHEN 325 MG PO TABS
650.0000 mg | ORAL_TABLET | Freq: Four times a day (QID) | ORAL | Status: DC | PRN
  Administered 2018-02-14 – 2018-02-15 (×2): 650 mg via ORAL
  Filled 2018-02-14 (×2): qty 2

## 2018-02-14 MED ORDER — K PHOS MONO-SOD PHOS DI & MONO 155-852-130 MG PO TABS
500.0000 mg | ORAL_TABLET | Freq: Three times a day (TID) | ORAL | Status: DC
  Administered 2018-02-14 – 2018-02-17 (×15): 500 mg
  Filled 2018-02-14 (×16): qty 2

## 2018-02-14 MED ORDER — K PHOS MONO-SOD PHOS DI & MONO 155-852-130 MG PO TABS
500.0000 mg | ORAL_TABLET | Freq: Three times a day (TID) | ORAL | Status: DC
  Filled 2018-02-14 (×3): qty 2

## 2018-02-14 MED ORDER — LORAZEPAM 2 MG/ML IJ SOLN: 4.0000 mg | INTRAMUSCULAR | Status: DC | PRN

## 2018-02-14 NOTE — Progress Notes (Addendum)
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 Patient had a seizure that lasted for 6-8 mins as per mother today while watching TV and almost stopped breathing and she started giving him rescue breaths. He was brought to the ED where he had another seizure and had to be intubated. She states that he usually gets 2-3 seizures a month but not this severe and last time he had this severe seizure was in 2017 and required intubation. No fever no chills no rigors. He is compliant to his medications. He does have a port and they occasionally access it to give IV meds if needed to break seizures. They did not do that this time. I was called after neurology were consulted and patient is already intubated and was not able to get any history from him obviously.   Subjective: 5/26 No further seizures Sedated and intubated Weaned FiO2 from 80% to 40% on 5/26 T Max 100.8/ WBC 8/9   VITAL SIGNS: BP 108/73   Pulse 95   Temp 100.1 F (37.8 C) (Axillary)   Resp 19   Ht 5\' 3"  (1.6 m)   Wt 135 lb 9.3 oz (61.5 kg)   SpO2 100%   BMI 24.02 kg/m   HEMODYNAMICS:    VENTILATOR SETTINGS: Vent Mode: PRVC FiO2 (%):  [40 %-100 %] 40 % Set Rate:  [14 bmp] 14 bmp Vt Set:  [450 mL] 450 mL PEEP:  [5 cmH20] 5 cmH20 Plateau Pressure:  [19 cmH20-22 cmH20] 21 cmH20  INTAKE / OUTPUT: I/O last 3 completed shifts: In: 1056.5 [I.V.:666.5;  NG/GT:240; IV Piggyback:150] Out: 1250 [Urine:1150; Emesis/NG output:100]  PHYSICAL EXAMINATION: General:  Sedated and intubated, appears younger than states age, following commands Neuro: minimal sedation,  pupil 60mm reactive, MAE x 4, A&O x 3, following commands , purposeful , face symmetrical, tongue midline, Motor strength 5/5 x 4 extremities HEENT: intubated , NCAT, no lymphadenopathy, MM Pink and moist, thick neck, port a cath Cardiovascular: S1, S2, RRR, no GMR Lungs: Clear , diminished per bases, bilateral chest excursion noted , old blood per in-line tubing Abdomen:  Soft no tenderness, ND, Obese, BS quiet  Musculoskeletal:  No edema, no obvious dreformies Skin:  Dilated veins on the chest , port a cath  LABS:  BMET Recent Labs  Lab 02/13/18 1419 02/14/18 0105  NA 139 138  K 3.3* 3.2*  CL 108 107  CO2 19* 21*  BUN 9 6  CREATININE 1.18 0.96  GLUCOSE 133* 117*    Electrolytes Recent Labs  Lab 02/13/18 1419 02/14/18 0105  CALCIUM 8.3* 7.4*    CBC Recent Labs  Lab 02/13/18 1419 02/14/18 0105  WBC 10.6* 8.9  HGB 12.8* 11.2*  HCT 40.5 35.8*  PLT 233 203    Coag's No results for input(s): APTT, INR in the last 168 hours.  Sepsis Markers No results for input(s): LATICACIDVEN, PROCALCITON, O2SATVEN in the last 168 hours.  ABG No results for input(s): PHART, PCO2ART, PO2ART in the last 168 hours.  Liver Enzymes Recent Labs  Lab 02/13/18 1419  AST 52*  ALT 44  ALKPHOS 81  BILITOT 0.6  ALBUMIN 3.5    Cardiac Enzymes No results for input(s): TROPONINI, PROBNP in the last 168 hours.  Glucose No results for input(s): GLUCAP in the last 168 hours.  Imaging Dg Chest Portable 1 View  Result Date: 02/13/2018 CLINICAL DATA:  Hypoxia EXAM: PORTABLE CHEST 1 VIEW COMPARISON:  Feb 13, 2018 FINDINGS: Endotracheal tube tip is 2.4 cm above the carina. Nasogastric tube tip and side port in stomach. [name] tip is in the superior vena cava near the  cavoatrial junction, stable. A stimulator is seen on the left with leads extending into the neck region. No pneumothorax. There is atelectatic change in the left lung base. Lungs elsewhere clear. Heart is upper normal in size with pulmonary vascularity within normal limits. No adenopathy. No bone lesions. IMPRESSION: Tube and catheter positions as described without pneumothorax. Left base atelectasis. Lungs elsewhere clear. Stable cardiac silhouette. Electronically Signed   By: Lowella Grip Perez M.D.   On: 02/13/2018 18:24      ASSESSMENT / PLAN:  Status epilepticus  Hypokalemia Atelectasis   Acute respiratory insufficiency requiring mechanical ventilation 2/2 Status Epilepticus  Unable to protect Airway? Aspiration Weaned to 30% 5/26  Plan: Vent Bundle WUA Q am starting 5/27 CPAP PS trials starting 5/27 am with hope of extubation Propofol for sedation for vent synchrony Trend CXR ABX if clinically indicated>> low threshold NPO except meds Trend fever curve/ WBC  Status Epilepticus Hx of frequent episodes Loaded with 200 mg of IV Vimpat Loaded with IV phenobarbital Family manage at home with IV ativan and phenobarb through port a cath  Plan: We will increase Keppra to 1 gram BID per neuro recommendations Monitor for seizures EEG if additional seizures Appreciate neuro input  Renal Hypokalemia Plan: replace potassium ( K Phos) Trend BMET Monitor UO Check Mag/Phos 5/27 am Replete electrolytes as needed   Endocrine Hypothyroid Plan: Synthroid 25 mcg daily    - DVT prophylaxis - PPI for GI prophylaxis  - resume home thyroxine  - NS 44ml/hr - NPO other than meds     FAMILY  - Updates: father  updated at bedside 5/26  No further seizure activity, Keppra dose increased to 1 gram BID per neuro recs.  Hopeful for CPAP and  extubation 5/27.    [name], AGACNP-BC [facility] [place] Pager: [phone]  02/14/2018, 3:37 PM

## 2018-02-14 NOTE — Progress Notes (Signed)
Reason for consult:  Subjective: Patient intubated, following commands. No further seizure activity.    ROS: negative except above   Examination  Vital signs in last 24 hours: Temp:  [98.8 F (37.1 C)-100.8 F (38.2 C)] 99.9 F (37.7 C) (05/26 0800) Pulse Rate:  [79-129] 83 (05/26 0814) Resp:  [12-38] 12 (05/26 0814) BP: (81-128)/(51-103) 101/65 (05/26 0814) SpO2:  [94 %-100 %] 100 % (05/26 0814) FiO2 (%):  [50 %-100 %] 50 % (05/26 0814) Weight:  [59 kg (130 lb 1.1 oz)-61.5 kg (135 lb 9.3 oz)] 61.5 kg (135 lb 9.3 oz) (05/26 0330)  General: Not in distress, cooperative CVS: pulse-normal rate and rhythm RS: breathing comfortably Extremities: normal   Neuro: intubated, on minimal sedation  MS: Alert, oriented, follows commands CN: pupils equal and reactive,  EOMI, face symmetric, tongue midline, normal sensation over face, Motor: 5/5 strength in all 4 extremities Coordination: normal Gait: not tested  Basic Metabolic Panel: Recent Labs  Lab 02/13/18 1419 02/14/18 0105  NA 139 138  K 3.3* 3.2*  CL 108 107  CO2 19* 21*  GLUCOSE 133* 117*  BUN 9 6  CREATININE 1.18 0.96  CALCIUM 8.3* 7.4*    CBC: Recent Labs  Lab 02/13/18 1419 02/14/18 0105  WBC 10.6* 8.9  NEUTROABS 7.2  --   HGB 12.8* 11.2*  HCT 40.5 35.8*  MCV 85.6 86.1  PLT 233 203     Coagulation Studies: No results for input(s): LABPROT, INR in the last 72 hours.  Imaging Reviewed:     ASSESSMENT AND PLAN  Refractory Epilepsy Status Epilepticus - resolved   Recommendations Continue Home meds Increased Keppra back to 1g BID ( from 500mG  BID) Continue to treat for aspiration pneumonia Extubate when tolerated     Pasco Marchitto [facility] Pager Number [phone] For questions after 7pm please refer to AMION to reach the Neurologist on call

## 2018-02-15 ENCOUNTER — Inpatient Hospital Stay (HOSPITAL_COMMUNITY): Payer: Medicaid Other

## 2018-02-15 LAB — COMPREHENSIVE METABOLIC PANEL
ALBUMIN: 2.6 g/dL — AB (ref 3.5–5.0)
ALT: 26 U/L (ref 17–63)
AST: 18 U/L (ref 15–41)
Alkaline Phosphatase: 56 U/L (ref 38–126)
Anion gap: 7 (ref 5–15)
BILIRUBIN TOTAL: 0.6 mg/dL (ref 0.3–1.2)
BUN: 6 mg/dL (ref 6–20)
CALCIUM: 7.7 mg/dL — AB (ref 8.9–10.3)
CO2: 22 mmol/L (ref 22–32)
CREATININE: 0.86 mg/dL (ref 0.61–1.24)
Chloride: 111 mmol/L (ref 101–111)
GFR calc non Af Amer: >60 mL/min (ref >60)
Glucose, Bld: 103 mg/dL — ABNORMAL HIGH (ref 65–99)
Potassium: 3.1 mmol/L — ABNORMAL LOW (ref 3.5–5.1)
SODIUM: 140 mmol/L (ref 135–145)
TOTAL PROTEIN: 5.6 g/dL — AB (ref 6.5–8.1)

## 2018-02-15 LAB — URINALYSIS, ROUTINE W REFLEX MICROSCOPIC
BILIRUBIN URINE: NEGATIVE
Bacteria, UA: NONE SEEN
Glucose, UA: 150 mg/dL — AB
Ketones, ur: 20 mg/dL — AB
LEUKOCYTES UA: NEGATIVE
NITRITE: NEGATIVE
PH: 7 (ref 5.0–8.0)
Protein, ur: NEGATIVE mg/dL
RBC / HPF: >50 RBC/hpf — ABNORMAL HIGH (ref 0–5)
SPECIFIC GRAVITY, URINE: 1.015 (ref 1.005–1.030)

## 2018-02-15 LAB — CBC
HCT: 32.4 % — ABNORMAL LOW (ref 39.0–52.0)
Hemoglobin: 10.2 g/dL — ABNORMAL LOW (ref 13.0–17.0)
MCH: 27.1 pg (ref 26.0–34.0)
MCHC: 31.5 g/dL (ref 30.0–36.0)
MCV: 86.2 fL (ref 78.0–100.0)
Platelets: 188 10*3/uL (ref 150–400)
RBC: 3.76 MIL/uL — ABNORMAL LOW (ref 4.22–5.81)
RDW: 14.3 % (ref 11.5–15.5)
WBC: 5.9 10*3/uL (ref 4.0–10.5)

## 2018-02-15 LAB — MAGNESIUM: Magnesium: 1.7 mg/dL (ref 1.7–2.4)

## 2018-02-15 MED ORDER — LOPERAMIDE HCL 2 MG PO CAPS
2.0000 mg | ORAL_CAPSULE | Freq: Four times a day (QID) | ORAL | Status: DC | PRN
  Administered 2018-02-15: 2 mg via ORAL
  Filled 2018-02-15: qty 1

## 2018-02-15 MED ORDER — CHLORHEXIDINE GLUCONATE CLOTH 2 % EX PADS
6.0000 | MEDICATED_PAD | Freq: Every day | CUTANEOUS | Status: DC
  Administered 2018-02-15 – 2018-02-16 (×2): 6 via TOPICAL

## 2018-02-15 MED ORDER — CARBAMAZEPINE 100 MG PO CHEW
150.0000 mg | CHEWABLE_TABLET | Freq: Every day | ORAL | Status: DC
  Administered 2018-02-16: 150 mg via ORAL
  Filled 2018-02-15: qty 1.5

## 2018-02-15 MED ORDER — CARBAMAZEPINE 100 MG PO CHEW
200.0000 mg | CHEWABLE_TABLET | Freq: Two times a day (BID) | ORAL | Status: DC
  Administered 2018-02-15 – 2018-02-17 (×4): 200 mg via ORAL
  Filled 2018-02-15 (×5): qty 2

## 2018-02-15 MED ORDER — FAMOTIDINE 40 MG/5ML PO SUSR
20.0000 mg | Freq: Two times a day (BID) | ORAL | Status: DC
  Administered 2018-02-15 – 2018-02-17 (×4): 20 mg via ORAL
  Filled 2018-02-15 (×4): qty 2.5

## 2018-02-15 NOTE — Evaluation (Signed)
Clinical/Bedside Swallow Evaluation Patient Details  Name: Bowdy Bair [name] MRN: [id number] Date of Birth: 08/20/1986  Today's Date: 02/15/2018 Time: SLP Start Time (ACUTE ONLY): 3419 SLP Stop Time (ACUTE ONLY): 1359 SLP Time Calculation (min) (ACUTE ONLY): 10 min  Past Medical History:  Past Medical History:  Diagnosis Date  . ALLERGIC RHINITIS 12/26/2009  . Blood clot in vein   . Brown's syndrome   . Growth disorder   . Headache   . Heart murmur    moderate MR  . Hyperlipidemia    No therapy  . Hypokalemia   . Mental retardation, mild (I.Q. 50-70)   . MITRAL VALVE PROLAPSE 06/22/2007  . SEIZURE DISORDER 06/22/2007  . Sleep apnea    cpap , last sleep study 10/01/11  . Unspecified hypothyroidism 06/22/2007   Past Surgical History:  Past Surgical History:  Procedure Laterality Date  . ANKLE SURGERY Left   . EYE SURGERY     X2 for Brown Syndrome  . IMPLANTATION VAGAL NERVE STIMULATOR     Left  . 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 Patient had a seizure that lasted for 6-8 mins as per mother today while watching TV and almost stopped breathing and she started giving him rescue breaths. He was brought to the ED where he had another seizure and had to be intubated 5/25-5/27. Pt has been seen by SLP in prior admission. Mild oral dysphagia secondary to impaired respiratory pattern. Occasionally chokes at home due to over rapid/large intake, but coughs out ok per Mom.    Assessment / Plan / Recommendation Clinical Impression  Pt demonstrates baseline swallow function including slightly impaired  breathing swallow pattern noted. Slight grunting post swallow questionable for throat clear, but under futher observation this is part of pts breathing pattern which mother cofirms as baseline. No signs of acutely impairmed airway protection. Recommend pt resume home diet of regular foods cut up by family with supervision for rate and aspiration precautions. No SLP f/u needed will sign off.  SLP Visit Diagnosis: Dysphagia, unspecified (R13.10)    Aspiration Risk  Mild aspiration risk    Diet Recommendation Regular;Thin liquid   Liquid Administration via: Cup;Straw Medication Administration: Whole meds with puree Supervision: Comment(family to assist with feeding) Compensations: Slow rate;Small sips/bites Postural Changes: Seated upright at 90 degrees    Other  Recommendations Oral Care Recommendations: Oral care BID   Follow up Recommendations None      Frequency and Duration            Prognosis        Swallow Study   General HPI: Mr Schwertner is a 32 yr old male with history of seizure, 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 Patient had a seizure that lasted for 6-8 mins as per mother today while watching TV and almost stopped breathing and she started giving him rescue breaths. He was brought to the ED where he had another seizure and had to be intubated 5/25-5/27. Pt has been seen  by SLP in prior admission. Mild oral dysphagia secondary to impaired respiratory pattern. Occasionally chokes at home due to over rapid/large intake, but coughs out ok per Mom.  Type of Study: Bedside Swallow Evaluation Previous Swallow Assessment: see HPI Diet Prior to this Study: NPO Respiratory Status: Room air History of Recent  Intubation: Yes Length of Intubations (days): (2) Date extubated: 02/15/18 Behavior/Cognition: Cooperative;Alert Oral Cavity Assessment: Within Functional Limits Oral Care Completed by SLP: Yes Oral Cavity - Dentition: Adequate natural dentition Vision: Functional for self-feeding Self-Feeding Abilities: Needs assist Patient Positioning: Upright in bed Baseline Vocal Quality: Low vocal intensity;Hoarse Volitional Cough: Strong Volitional Swallow: Able to elicit    Oral/Motor/Sensory Function Overall Oral Motor/Sensory Function: Within functional limits   Ice Chips     Thin Liquid Thin Liquid: Within functional limits Presentation: Cup;Straw    Nectar Thick Nectar Thick Liquid: Not tested   Honey Thick Honey Thick Liquid: Not tested   Puree Puree: Within functional limits Presentation: Spoon   Solid   GO   Solid: Within functional limits        Francy Mcilvaine, Katherene Ponto 02/15/2018,2:01 PM

## 2018-02-15 NOTE — Progress Notes (Signed)
Subjective: No further seizures, down on FIO2 to 30%  Exam: Vitals:   02/15/18 0746 02/15/18 0800  BP: 97/61 103/76  Pulse: 76 80  Resp: 16 17  Temp:  98.7 F (37.1 C)  SpO2: 100% 100%   Gen: In bed, intubated Resp: ventilated Abd: soft, nt  Neuro: MS: Awake, alert, follows commands briskly.  ID:POEUM, crosses midline bilaterally, ? Difficulty seeing things on the right(mother states that he has vision problems at baseline).  Motor: follows commands in all 4 ext Sensory: endorses symmetric sensation.   Pertinent Labs: Na 139 Ca 7.4   Home AEDs: lacosamide 100mg  BID Clobazam 10mg  qam-10mg  qpm-20mg  qhs Trokendi XR 250mg  qhs Phenobarbital/ativan PRN seizure cluster Tegretol 200mg  qam 200mg  qpm 150mg  qhs.  keppra 500mg  BID  Impression: 32 year old male with long-standing difficult to control epilepsy who presents with status epilepticus.  I do not know that I did make any major changes to his underlying medications, but he seems to be doing well at this point.  No clear precipitating factors. Once extubated, will decerase keppra, but continue 1g BID for now.    Recommendations: 1) check magnesium 2) continue tegretol 200-200-150 3) Continue trokendi 250mg  qhs 4) continue keppra 1g BID 5) continue vimpat 100mg  BID 6) Continue clobazam 07-02-19   Roland Rack, MD [facility] [phone]  If 7pm- 7am, please page neurology on call as listed in [place].

## 2018-02-15 NOTE — Procedures (Signed)
Extubation Procedure Note  Patient Details:   Name: [name] DOB: 1985-09-27 MRN: [id number]   Airway Documentation:    Vent end date: 02/15/18 Vent end time: 0915   Positive cuff leak prior to extubation.   Evaluation  O2 sats: stable throughout Complications: No apparent complications Patient did tolerate procedure well. Bilateral Breath Sounds: Clear, Diminished   Yes- pt has clear ability to speak.    Pt extubated to 3L [place] with RN at bedside. No signs of distress or stridor noted.   Elwin Mocha 02/15/2018, 9:21 AM

## 2018-02-15 NOTE — Progress Notes (Signed)
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 Patient had a seizure that lasted for 6-8 mins as per mother today while watching TV and almost stopped breathing and she started giving him rescue breaths. He was brought to the ED where he had another seizure and had to be intubated. She states that he usually gets 2-3 seizures a month but not this severe and last time he had this severe seizure was in 2017 and required intubation. No fever no chills no rigors. He is compliant to his medications. He does have a port and they occasionally access it to give IV meds if needed to break seizures.      This morning he remains intubated and is sedated on 5 mcg of propofol.  He is nodding appropriately to questions and following simple instructions.  He denied dyspnea after being switched to CPAP with pressure support of 7.   Subjective: 5/26 No further seizures Sedated and intubated Weaned FiO2 from 80% to 40% on 5/26 T Max 100.8/ WBC 8/9   VITAL SIGNS: BP 103/76 (BP Location: Right Arm)   Pulse 80   Temp 98.7 F (37.1 C) (Axillary)   Resp 17   Ht 5\' 3"  (1.6 m)   Wt 138 lb 7.2 oz (62.8 kg)   SpO2 100%   BMI 24.53 kg/m   HEMODYNAMICS:    VENTILATOR SETTINGS: Vent Mode: PRVC FiO2 (%):  [30 %-40 %] 30 % Set Rate:  [14 bmp] 14 bmp Vt Set:  [450 mL] 450 mL PEEP:  [5 cmH20] 5 cmH20 Plateau Pressure:  [20 cmH20-22 cmH20] 22  cmH20  INTAKE / OUTPUT: I/O last 3 completed shifts: In: 3074.4 [I.V.:2009.4; NG/GT:640; IV Piggyback:425] Out: 3419 [Urine:3350; Emesis/NG output:100]  PHYSICAL EXAMINATION: General: Orally intubated mechanically ventilated in no distress on CPAP.   Neuro: He is nodding appropriately to questions.  He moves all fours on request.  Pupils are equal.  Face symmetric.     Cardiovascular: S1 and S2 are regular there is a 2 out of 6 systolic ejection murmur.   Lungs: Respirations are unlabored on a pressure support of 7.  There is symmetric air movement, no wheezes, no rhonchi.  Minute ventilation is only approximately 6 L/min. Abdomen: The abdomen is soft without any organomegaly masses or tenderness   Musculoskeletal:  No edema, no obvious dreformies  LABS:  BMET Recent Labs  Lab 02/13/18 1419 02/14/18 0105 02/15/18 0639  NA 139 138 140  K 3.3* 3.2* 3.1*  CL 108 107 111  CO2 19* 21* 22  BUN 9 6 6   CREATININE 1.18 0.96 0.86  GLUCOSE 133* 117* 103*    Electrolytes Recent Labs  Lab 02/13/18 1419 02/14/18 0105 02/15/18 0639  CALCIUM 8.3* 7.4* 7.7*    CBC Recent Labs  Lab 02/13/18 1419 02/14/18 0105 02/15/18 0639  WBC 10.6* 8.9 5.9  HGB 12.8* 11.2* 10.2*  HCT 40.5 35.8* 32.4*  PLT 233 203 188    Coag's No results for input(s): APTT, INR in  the last 168 hours.  Sepsis Markers No results for input(s): LATICACIDVEN, PROCALCITON, O2SATVEN in the last 168 hours.  ABG No results for input(s): PHART, PCO2ART, PO2ART in the last 168 hours.  Liver Enzymes Recent Labs  Lab 02/13/18 1419 02/15/18 0639  AST 52* 18  ALT 44 26  ALKPHOS 81 56  BILITOT 0.6 0.6  ALBUMIN 3.5 2.6*    Cardiac Enzymes No results for input(s): TROPONINI, PROBNP in the last 168 hours.  Glucose No results for input(s): GLUCAP in the last 168 hours.  Imaging Dg Chest Port 1 View  Addendum Date: 02/15/2018   ADDENDUM REPORT: 02/15/2018 08:39 ADDENDUM: The original report was by Dr.  Van Clines. The following addendum is by Dr. Van Clines: Critical Value/emergent results were called by telephone at the time of interpretation on 02/15/2018 at 8:35 am to Dr. Pearline Cables , who verbally acknowledged these results. Electronically Signed   By: Van Clines M.D.   On: 02/15/2018 08:39   Result Date: 02/15/2018 CLINICAL DATA:  Respiratory failure EXAM: PORTABLE CHEST 1 VIEW COMPARISON:  02/13/2018 FINDINGS: The endotracheal tube tip is at the carina and needs to be retracted 2.5 cm. Nasogastric tube enters the stomach. Right [name] tip: Cavoatrial junction. Low lung volumes. Pulse generator/stimulator noted with leads extending up into the left neck. Given the low lung volumes, the lungs appear clear. Upper normal heart size. IMPRESSION: 1. Endotracheal tube tip is at the carina, mild position. Retraction of 2.5 cm recommended. 2. The lungs remain clear. Radiology assistant personnel have been notified to put me in telephone contact with the referring physician or the referring physician's clinical representative in order to discuss these findings. Once this communication is established I will issue an addendum to this report for documentation purposes. Electronically Signed: By: Van Clines M.D. On: 02/15/2018 08:15   Chest x-ray this morning shows an endotracheal tube this a little low, cardiac silhouette is somewhat enlarged there are no infiltrates.  There is a right sided port and left-sided vagal nerve stimulator   ASSESSMENT / PLAN:  Status epilepticus  Hypokalemia Atelectasis   Acute respiratory insufficiency requiring mechanical ventilation 2/2 Status Epilepticus  Unable to protect Airway? Aspiration Weaned to 30% 5/26  Plan: Vent Bundle WUA Q am starting 5/27 CPAP PS trials starting 5/27 am with hope of extubation Propofol for sedation for vent synchrony Trend CXR ABX if clinically indicated>> low threshold NPO except meds Trend fever curve/  WBC  Status Epilepticus Hx of frequent episodes Loaded with 200 mg of IV Vimpat Loaded with IV phenobarbital Family manage at home with IV ativan and phenobarb through port a cath  Plan: He has had no further seizure activity with an increase of his dose of Keppra to 1 g every 12 hours.  Continues on a combination of Tegretol, the, Vimpat, and topiramate.  He appears to have had no concurrent illness potentially precipitating his seizures, and his compliance is reported to be good   Renal Hypokalemia Plan: replace potassium ( K Phos) Trend BMET Monitor UO Check Mag/Phos 5/27 am Replete electrolytes as needed   Endocrine Hypothyroid Plan: Synthroid 25 mcg daily    - DVT prophylaxis - PPI for GI prophylaxis  - resume home thyroxine  - NS 23ml/hr - NPO other than meds    Lars Masson, MD [place] Pager: [phone]  02/15/2018, 8:55 AM

## 2018-02-15 NOTE — Plan of Care (Signed)
Pt maintaining patent airway on RA SPO2 remaining above 94% since extubation this AM no s/s resp distress noted.

## 2018-02-16 DIAGNOSIS — G40901 Epilepsy, unspecified, not intractable, with status epilepticus: Secondary | ICD-10-CM

## 2018-02-16 LAB — BASIC METABOLIC PANEL
Anion gap: 8 (ref 5–15)
BUN: 6 mg/dL (ref 6–20)
CALCIUM: 7.8 mg/dL — AB (ref 8.9–10.3)
CO2: 21 mmol/L — ABNORMAL LOW (ref 22–32)
CREATININE: 0.84 mg/dL (ref 0.61–1.24)
Chloride: 113 mmol/L — ABNORMAL HIGH (ref 101–111)
GFR calc Af Amer: >60 mL/min (ref >60)
Glucose, Bld: 90 mg/dL (ref 65–99)
Potassium: 3.2 mmol/L — ABNORMAL LOW (ref 3.5–5.1)
SODIUM: 142 mmol/L (ref 135–145)

## 2018-02-16 LAB — MAGNESIUM: MAGNESIUM: 1.8 mg/dL (ref 1.7–2.4)

## 2018-02-16 MED ORDER — MAGNESIUM SULFATE IN D5W 1-5 GM/100ML-% IV SOLN
1.0000 g | Freq: Once | INTRAVENOUS | Status: AC
  Administered 2018-02-16: 1 g via INTRAVENOUS
  Filled 2018-02-16: qty 100

## 2018-02-16 MED ORDER — CLOBAZAM 10 MG PO TABS
20.0000 mg | ORAL_TABLET | Freq: Every day | ORAL | Status: DC
  Administered 2018-02-16: 20 mg via ORAL
  Filled 2018-02-16: qty 2

## 2018-02-16 MED ORDER — CLOBAZAM 10 MG PO TABS
10.0000 mg | ORAL_TABLET | Freq: Two times a day (BID) | ORAL | Status: DC
  Administered 2018-02-16 – 2018-02-17 (×2): 10 mg via ORAL
  Filled 2018-02-16 (×2): qty 1

## 2018-02-16 MED ORDER — LEVETIRACETAM 500 MG PO TABS
1000.0000 mg | ORAL_TABLET | Freq: Two times a day (BID) | ORAL | Status: DC
  Administered 2018-02-17: 1000 mg via ORAL
  Filled 2018-02-16 (×2): qty 2

## 2018-02-16 MED ORDER — CHLORHEXIDINE GLUCONATE 0.12 % MT SOLN
OROMUCOSAL | Status: AC
  Administered 2018-02-16: 15 mL via OROMUCOSAL
  Filled 2018-02-16: qty 15

## 2018-02-16 MED ORDER — POTASSIUM CHLORIDE CRYS ER 20 MEQ PO TBCR
40.0000 meq | EXTENDED_RELEASE_TABLET | Freq: Once | ORAL | Status: AC
  Administered 2018-02-16: 40 meq via ORAL
  Filled 2018-02-16: qty 2

## 2018-02-16 NOTE — Discharge Summary (Addendum)
Physician Discharge Summary  Patient ID: [name] MRN: [id number] DOB/AGE: May 28, 1986 32 y.o.  Admit date: 02/13/2018 Discharge date: 02/17/2018    Discharge Diagnoses:  Status Epilepticus  Acute Respiratory Insufficiency  Possible Aspiration  OSA on CPAP Hypokalemia  Difficulty Voiding  Hypothyroidism                                                                      DISCHARGE PLAN BY DIAGNOSIS      Status Epilepticus   Discharge Plan: Follow up with Dr. Gaynell Face as outpatient  Continue the following medications:  lacosamine 100 mg BID Trokendi XR 250 mg QHS Clobazam 10 mg QAM / 10 mg QPM / 20 mg QHS Tegretol 200 mg QAM / 200 mg QPM / 150 mg QHS Keppra 1 gm BID > note increase in dosing  Penobarbital & Ativan PRN seizure   Acute Respiratory Insufficiency  Possible Aspiration  OSA on CPAP  Discharge Plan: Resolved, no acute follow up CXR reviewed at discharge, within normal limits  Continue home CPAP as previously prescribed  Hypokalemia  Difficulty Voiding - resolved  Discharge Plan: Continue KPhos  Add KCL 20 mEq QD.  Will need repeat BMP on 6/4 with PCP.  Hypothyroidism   Discharge Plan: Continue synthroid  Follow up TSH per routine with PCP                    DISCHARGE SUMMARY    32 yr old male who presented to [facility] on 5/25 with status epilepticus.    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  Additionally, he has acquired superior vena cava thrombosis and OSA on CPAP.  He walks with braces at baseline due to foot drop.  The patient presented after having a seizure that lasted for 6-8 mins as per mother today while watching TV and almost stopped breathing.  She started giving him rescue  breaths. He was brought to the ED where he had another seizure and had to be intubated. His mother reported he usually gets 2-3 seizures a month but not this severe and last time he had this severe seizure was in 2017 and required intubation.  He is compliant to his medications but recently they had been reducing his Keppra under supervision. He does have a port and they occasionally access it to give IV meds if needed to break seizures.  He was admitted to ICU and supported on mechanical ventilation.  He required propofol for suppression of seizures. Neurology evaluated the patient and made recommendations for increasing Keppra to 1gm BID. The patient met criteria for extubation 5/27 and was liberated from mechanical ventilation.  Post extubation, he was evaluated by SLP with recommendations for regular diet and thin liquids.  He had difficulty voiding and briefly required a foley catheter.  Hospital course notable for hypokalemia despite KPhos administration.  He was transitioned out of ICU and medically cleared for discharge on 5/29 with plans as above.            SIGNIFICANT DIAGNOSTIC STUDIES SLP Evaluation 5/27 >> cleared for  regular diet, thin liquids, mild aspiration risk Liquid Administration via: Cup;Straw Medication Administration: Whole meds with puree Supervision: Comment (family to assist with feeding) Compensations: Slow rate;Small sips/bites Postural Changes: Seated upright at 90 degrees   CONSULTS Neurology - Dr. Leonel Ramsay   TUBES / LINES ETT 5/25 >> 5/27   Discharge Exam: General: adult male in NAD, mother at bedside Neuro: Awake, alert, smiling, at baseline neuro status per mother, occasional grunting CV: s1s2 rrr, NSR 80's on monitor  PULM: abnormal respiratory effort but non-labored, clear bilaterally, on RA GI: protuberant, soft, bsx4 active  Extremities: warm/dry, no edema  Vitals:   02/17/18 0500 02/17/18 0600 02/17/18 0700 02/17/18 0803  BP: 95/73 103/69 98/67    Pulse: 62 71 76   Resp: (!) 23 20 (!) 22   Temp:    98.9 F (37.2 C)  TempSrc:    Oral  SpO2: 100% 100% 100%   Weight: 142 lb 13.7 oz (64.8 kg)     Height:         Discharge Labs  BMET Recent Labs  Lab 02/13/18 1419 02/14/18 0105 02/15/18 0639 02/15/18 0850 02/16/18 0242 02/17/18 0224  NA 139 138 140  --  142 138  K 3.3* 3.2* 3.1*  --  3.2* 2.8*  CL 108 107 111  --  113* 107  CO2 19* 21* 22  --  21* 23  GLUCOSE 133* 117* 103*  --  90 105*  BUN _0 --  6 7  CREATININE 1.18 0.96 0.86  --  0.84 0.75  CALCIUM 8.3* 7.4* 7.7*  --  7.8* 8.0*  MG  --   --   --  1.7 1.8  --     CBC Recent Labs  Lab 02/14/18 0105 02/15/18 0639 02/17/18 0224  HGB 11.2* 10.2* 9.9*  HCT 35.8* 32.4* 31.1*  WBC 8.9 5.9 3.9*  PLT 203 188 217       Reactions   Antihistamines, Chlorpheniramine-type Other (See Comments)   Other Reaction: Dimetapp causes seizures   Divalproex Sodium Other (See Comments)   Nausea, vomiting, liver and kidney dysfunction   Phenytoin Rash, Other (See Comments)   Dilantin causes more seizures.   Valproic Acid And Related Other (See Comments)   Shuts down systems   Vancomycin Rash, Other (See Comments), Swelling   If given too fast, causes red man reaction Other Reaction: Redman's Syndrome      Medication List    TAKE these medications   acetaminophen 500 MG tablet Commonly known as:  TYLENOL Take 1,000 mg by mouth every 6 (six) hours as needed for headache.   carbamazepine 100 MG chewable tablet Commonly known as:  TEGRETOL Chew 2 tablets in the morning, 2 tablets at midday and 1+1/2 tablets in the evening What changed:    how much to take  how to take this  when to take this  additional instructions   cetirizine 10 MG tablet Commonly known as:  ZYRTEC Take 10 mg by mouth daily.   diazepam 2 MG tablet Commonly known as:  VALIUM Take 1 tablet every 6 - 8 hours as needed for anxiety or seizures What changed:    how much to take  how  to take this  when to take this  reasons to take this  additional instructions   Flaxseed Oil 1000 MG Caps Take 1,000 mg by mouth daily.   guaiFENesin 600 MG 12 hr tablet Commonly known as:  MUCINEX Take 600 mg by  mouth 2 (two) times daily.   hydrocortisone 25 MG suppository Commonly known as:  ANUSOL-HC Place 1 suppository (25 mg total) rectally at bedtime. What changed:    when to take this  reasons to take this   levETIRAcetam 1000 MG tablet Commonly known as:  KEPPRA Take 1 tablet (1,000 mg total) by mouth 2 (two) times daily. What changed:    medication strength  how much to take  additional instructions   levothyroxine 25 MCG tablet Commonly known as:  SYNTHROID, LEVOTHROID Take 25 mcg by mouth daily before breakfast.   LORazepam 2 MG/ML concentrated solution Commonly known as:  ATIVAN Withdraw 59m (279m and 66m44mNormal saline into 3 ml syringe. Administer IV push over 3-5 minutes as needed for recurrent seizures What changed:    how much to take  when to take this  additional instructions   montelukast 10 MG tablet Commonly known as:  SINGULAIR Take 10 mg by mouth at bedtime.   multivitamin with minerals Tabs tablet Take 1 tablet by mouth daily.   OMEGA 3 PO Take 520 mg by mouth daily.   ondansetron 4 MG disintegrating tablet Commonly known as:  ZOFRAN-ODT Take 1 tablet as needed for nausea and vomiting What changed:    how much to take  how to take this  when to take this  reasons to take this  additional instructions   [name] 10 MG tablet Generic drug:  cloBAZam TAKE 1 TABLET BY MOUTH IN THE MORNING, 1 TAB IN THE AFTERNOON AND 2 TABLETS AT BEDTIME   PHENObarbital 65 MG/ML injection Commonly known as:  LUMINAL Withdraw 66ml40m5mg18menobarbital and 4ml N14mal Saline into 5ml sy55mge. Adminster IV push for 3-5 minutes as needed for recurrent seizures What changed:    how much to take  when to take this  additional  instructions   potassium chloride SA 20 MEQ tablet Commonly known as:  K-DUR,KLOR-CON Take 1 tablet (20 mEq total) by mouth daily.   potassium phosphate (monobasic) 500 MG tablet Commonly known as:  K-PHOS TAKE 1 TABLET (500mg) B93mUTH FOUR TIMES A DAY What changed:    how much to take  how to take this  when to take this  additional instructions   promethazine 25 MG suppository Commonly known as:  PHENERGAN Place 1 suppository (25 mg total) rectally every 6 (six) hours as needed for nausea or vomiting.   rosuvastatin 10 MG tablet Commonly known as:  CRESTOR Take 10 mg by mouth at bedtime.   Topiramate ER 200 MG Cp24 Commonly known as:  TROKENDI XR Take 1 capsule by mouth at bedtime What changed:    how much to take  how to take this  when to take this  additional instructions   Topiramate ER 50 MG Cp24 Commonly known as:  TROKENDI XR Take 2 capsules by mouth at bedtime. What changed:    how much to take  when to take this  additional instructions   VIMPAT 100 MG Tabs Generic drug:  Lacosamide Take 100 mg by mouth 2 (two) times daily.        Disposition:  Home.  No new home health needs identified at time of discharge.   Discharged Condition: Marcus TCincere ZornIII has met maximum benefit of inpatient care and is medically stable and cleared for discharge.  Patient is pending follow up as above.      Time spent on disposition:  35 Minutes.   Signed: Mechel Haggard ONoe GenseBauer  Pulmonary & Critical Care Pgr: [phone] or if no answer [phone] 02/17/2018, 9:35 AM

## 2018-02-16 NOTE — Progress Notes (Signed)
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 Patient had a seizure that lasted for 6-8 mins as per mother today while watching TV and almost stopped breathing and she started giving him rescue breaths. He was brought to the ED where he had another seizure and had to be intubated. She states that he usually gets 2-3 seizures a month but not this severe and last time he had this severe seizure was in 2017 and required intubation. No fever no chills no rigors. He is compliant to his medications. He does have a port and they occasionally access it to give IV meds if needed to break seizures.  Required intubation / propofol for seizures.  Subsequently extubated 5/27.     SUBJECTIVE:  RN reports no acute events overnight.  Mother at bedside, asking if he can walk around the unit (wears braces to walk).     VITAL SIGNS: BP 109/84   Pulse 94   Temp 98.1 F (36.7 C) (Axillary)   Resp (!) 30   Ht 5\' 3"  (1.6 m)   Wt 135 lb 5.8 oz (61.4 kg)   SpO2 100%   BMI 23.98 kg/m   HEMODYNAMICS:    VENTILATOR SETTINGS:    INTAKE / OUTPUT: I/O last 3 completed shifts: In: 2900.8 [I.V.:2455.8; NG/GT:160; IV Piggyback:285] Out: 2761 [Urine:2760; Stool:1]  PHYSICAL EXAMINATION: General: chronically ill appearing male in NAD, sitting up in chair  HEENT: MM pink/moist Neuro: Awake, alert, at baseline per mother  CV: s1s2 rrr, no  m/r/g PULM: even/non-labored, lungs bilaterally coarse  JS:HFWY, non-tender, bsx4 active  Extremities: warm/dry, no edema  Skin: no rashes or lesions  LABS:  BMET Recent Labs  Lab 02/14/18 0105 02/15/18 0639 02/16/18 0242  NA 138 140 142  K 3.2* 3.1* 3.2*  CL 107 111 113*  CO2 21* 22 21*  BUN 6 6 6   CREATININE 0.96 0.86 0.84  GLUCOSE 117* 103* 90    Electrolytes Recent Labs  Lab 02/14/18 0105 02/15/18 0639 02/15/18 0850 02/16/18 0242  CALCIUM 7.4* 7.7*  --  7.8*  MG  --   --  1.7 1.8    CBC Recent Labs  Lab 02/13/18 1419 02/14/18 0105 02/15/18 0639  WBC 10.6* 8.9 5.9  HGB 12.8* 11.2* 10.2*  HCT 40.5 35.8* 32.4*  PLT 233 203 188    Coag's No results for input(s): APTT, INR in the last 168 hours.  Sepsis Markers No results for input(s): LATICACIDVEN, PROCALCITON, O2SATVEN in the last 168 hours.  ABG No results for input(s): PHART, PCO2ART, PO2ART in the last 168 hours.  Liver Enzymes Recent Labs  Lab 02/13/18 1419 02/15/18 0639  AST 52* 18  ALT 44 26  ALKPHOS 81 56  BILITOT 0.6 0.6  ALBUMIN 3.5 2.6*    Cardiac Enzymes No results for input(s): TROPONINI, PROBNP in the last 168 hours.  Glucose No results for input(s): GLUCAP in the last 168 hours.  Imaging No results found.   ASSESSMENT / PLAN:  NEURO A:  Status Epilepticus Hx of Seizures - frequent episodes at home, managed via port a cath with IV ativan & phenobarbital P: Continue Tegretol, Clobazam, Vimpat, Keppra, Topiramate PRN ativan for seizure Appreciate Neurology input   RESPIRATORY A: Acute Respiratory Insufficiency - requiring mechanical ventilation 2/2 Status Epilepticus  Concern for Aspiration - in setting of seizure vs atelectasis P: Pulmonary hygiene -IS, mobilzie  Follow intermittent CXR  SLP eval 5/27 > cleared for regular diet / thin liquids  RENAL A: Hypokalemia Difficulty Voiding  P: Trend BMP / urinary output Replace electrolytes as indicated Avoid  nephrotoxic agents, ensure adequate renal perfusion Remove foley, monitor voiding.  May require I/O.    ENDOCRINE A: Hypothyroidism P: Continue synthroid    Global - transfer out to medical floor.  Discontinue foley, ambulate.  Likely to discharge 5/29.   Noe Gens, NP-C [place] [facility] Pgr: [phone] or if no answer [phone] 02/16/2018, 11:25 AM

## 2018-02-16 NOTE — Progress Notes (Signed)
Subjective: Mother reports increased seizure frequency and severity since decreasing keppra previously. I discussed increasing it back to 1g BID and she is in favor of this.   Exam: Vitals:   02/16/18 0700 02/16/18 0800  BP: 97/60 94/64  Pulse: 81 80  Resp: (!) 27 (!) 25  Temp:    SpO2: 95% 93%   Gen: In bed, NAD Resp: Some increased secretions, but non-labored breathing Abd: soft, nt  Neuro: MS: Awake, alert, interactive and follows commands briskly.  PO:EUMPN, crosses midline bilaterally Motor: follows commands in all 4 ext Sensory: endorses symmetric sensation.   Pertinent Labs:    Home AEDs: lacosamide 100mg  BID Clobazam 10mg  qam-10mg  qpm-20mg  qhs Trokendi XR 250mg  qhs Phenobarbital/ativan PRN seizure cluster Tegretol 200mg  qam 200mg  qpm 150mg  qhs.  keppra 500mg  BID  Impression: 32 year old male with long-standing difficult to control epilepsy who presents with status epilepticus.  With mothers report of worsening since decreasing keppra, I think that taking this back to 1g BID would be a reasonable course of action.    Recommendations: 1) continue tegretol 200-200-150 2) Continue trokendi 250mg  qhs 3) continue keppra 1g BID(increased from home dose).  4) continue vimpat 100mg  BID 5) Continue clobazam 07-02-19   Roland Rack, MD [facility] [phone]  If 7pm- 7am, please page neurology on call as listed in [place].

## 2018-02-17 ENCOUNTER — Inpatient Hospital Stay (HOSPITAL_COMMUNITY): Payer: Medicaid Other

## 2018-02-17 LAB — CBC
HEMATOCRIT: 31.1 % — AB (ref 39.0–52.0)
Hemoglobin: 9.9 g/dL — ABNORMAL LOW (ref 13.0–17.0)
MCH: 26.8 pg (ref 26.0–34.0)
MCHC: 31.8 g/dL (ref 30.0–36.0)
MCV: 84.3 fL (ref 78.0–100.0)
PLATELETS: 217 10*3/uL (ref 150–400)
RBC: 3.69 MIL/uL — ABNORMAL LOW (ref 4.22–5.81)
RDW: 13.7 % (ref 11.5–15.5)
WBC: 3.9 10*3/uL — ABNORMAL LOW (ref 4.0–10.5)

## 2018-02-17 LAB — BASIC METABOLIC PANEL
Anion gap: 8 (ref 5–15)
BUN: 7 mg/dL (ref 6–20)
CO2: 23 mmol/L (ref 22–32)
CREATININE: 0.75 mg/dL (ref 0.61–1.24)
Calcium: 8 mg/dL — ABNORMAL LOW (ref 8.9–10.3)
Chloride: 107 mmol/L (ref 101–111)
GFR calc Af Amer: >60 mL/min (ref >60)
GFR calc non Af Amer: >60 mL/min (ref >60)
GLUCOSE: 105 mg/dL — AB (ref 65–99)
Potassium: 2.8 mmol/L — ABNORMAL LOW (ref 3.5–5.1)
Sodium: 138 mmol/L (ref 135–145)

## 2018-02-17 MED ORDER — POTASSIUM CHLORIDE CRYS ER 20 MEQ PO TBCR: 20.0000 meq | EXTENDED_RELEASE_TABLET | Freq: Every day | ORAL | 0 refills | Status: DC

## 2018-02-17 MED ORDER — LEVETIRACETAM 1000 MG PO TABS: 1000.0000 mg | ORAL_TABLET | Freq: Two times a day (BID) | ORAL | 0 refills | Status: DC

## 2018-02-17 MED ORDER — LACOSAMIDE 50 MG PO TABS
100.0000 mg | ORAL_TABLET | Freq: Two times a day (BID) | ORAL | Status: DC
  Administered 2018-02-17: 100 mg via ORAL
  Filled 2018-02-17: qty 2

## 2018-02-17 MED ORDER — POTASSIUM CHLORIDE CRYS ER 20 MEQ PO TBCR
40.0000 meq | EXTENDED_RELEASE_TABLET | Freq: Four times a day (QID) | ORAL | Status: DC
  Administered 2018-02-17: 40 meq via ORAL
  Filled 2018-02-17: qty 2

## 2018-02-17 MED ORDER — HEPARIN SOD (PORK) LOCK FLUSH 100 UNIT/ML IV SOLN
500.0000 [IU] | INTRAVENOUS | Status: AC | PRN
  Administered 2018-02-17: 500 [IU]

## 2018-02-17 MED ORDER — POTASSIUM CHLORIDE CRYS ER 20 MEQ PO TBCR
40.0000 meq | EXTENDED_RELEASE_TABLET | Freq: Four times a day (QID) | ORAL | Status: AC
  Administered 2018-02-17: 40 meq via ORAL
  Filled 2018-02-17: qty 2

## 2018-02-17 NOTE — Progress Notes (Signed)
[facility] Progress Note Patient Name: [name] DOB: June 28, 1986 MRN: [id number]   Date of Service  02/17/2018  HPI/Events of Note  K+ = 2.8 and Creatinine = 0.75.  eICU Interventions  Will replace K+.     Intervention Category Major Interventions: Electrolyte abnormality - evaluation and management  Seaton Hofmann [place] 02/17/2018, 6:33 AM

## 2018-02-17 NOTE — Progress Notes (Signed)
Subjective: Doing well, at baseline.   Exam: Vitals:   02/17/18 0700 02/17/18 0803  BP: 98/67   Pulse: 76   Resp: (!) 22   Temp:  98.9 F (37.2 C)  SpO2: 100%    Gen: In bed, NAD Resp: Some increased secretions, but non-labored breathing Abd: soft, nt  Neuro: MS: Awake, alert, interactive and follows commands briskly.  ER:DEYCX, crosses midline bilaterally Motor: follows commands in all 4 ext Sensory: endorses symmetric sensation.   Pertinent Labs: CR 0.75   Home AEDs: lacosamide 100mg  BID Clobazam 10mg  qam-10mg  qpm-20mg  qhs Trokendi XR 250mg  qhs Phenobarbital/ativan PRN seizure cluster Tegretol 200mg  qam 200mg  qpm 150mg  qhs.  keppra 500mg  BID  Impression: 32 year old male with long-standing difficult to control epilepsy who presents with status epilepticus.  With mothers report of worsening since decreasing keppra, I think that taking this back to 1g BID is reasonable and I would continue at this dose moving forward.   Recommendations: 1) continue tegretol 200-200-150 2) Continue trokendi 250mg  qhs 3) continue keppra 1g BID(increased from home dose).  4) continue vimpat 100mg  BID 5) Continue clobazam 07-02-19   Roland Rack, MD [facility] [phone]  If 7pm- 7am, please page neurology on call as listed in [place].

## 2018-02-17 NOTE — Progress Notes (Signed)
Reviewed d/c instructions w/ pt mother. Verbalized understanding of d/c instructions, follow-up care, next scheduled doses of medications, and return instructions. No further questions/concerns. VSS. NAD.

## 2018-02-17 NOTE — Plan of Care (Signed)
Pt ambulated in hallway w/ 1 staff assist, received scheduled meds per Noe Gens, CCM NP. Pt A&O x 4. Family at bedside, involved in care during stay and plans to provide care at home for pt. Family to transport pt from hospital to home, as well as to future appointments.

## 2018-02-22 ENCOUNTER — Ambulatory Visit (INDEPENDENT_AMBULATORY_CARE_PROVIDER_SITE_OTHER): Payer: Medicaid Other | Admitting: Pediatrics

## 2018-02-22 ENCOUNTER — Encounter (INDEPENDENT_AMBULATORY_CARE_PROVIDER_SITE_OTHER): Payer: Self-pay | Admitting: Pediatrics

## 2018-02-22 VITALS — BP 110/70 | HR 68 | Ht 63.25 in | Wt 126.0 lb

## 2018-02-22 DIAGNOSIS — G40319 Generalized idiopathic epilepsy and epileptic syndromes, intractable, without status epilepticus: Secondary | ICD-10-CM | POA: Diagnosis not present

## 2018-02-22 DIAGNOSIS — G40309 Generalized idiopathic epilepsy and epileptic syndromes, not intractable, without status epilepticus: Secondary | ICD-10-CM

## 2018-02-22 DIAGNOSIS — G40119 Localization-related (focal) (partial) symptomatic epilepsy and epileptic syndromes with simple partial seizures, intractable, without status epilepticus: Secondary | ICD-10-CM

## 2018-02-22 DIAGNOSIS — G43009 Migraine without aura, not intractable, without status migrainosus: Secondary | ICD-10-CM

## 2018-02-22 DIAGNOSIS — R2689 Other abnormalities of gait and mobility: Secondary | ICD-10-CM

## 2018-02-22 NOTE — Patient Instructions (Signed)
I am relieved to know that despite the severity of his seizure with respiratory failure, that [name] rebounded.  I am concerned about the time it took for him to receive intravenous phenobarbital and will try to investigate that.  I will need to speak with Dr. Francena Hanly who did the procedure to replace his port to determine whether or not we can do imaging of his vessels with Doppler, or whether is going to take an intravenous evaluation.  I think the latter is probably true.  The vagal nerve stimulator is fine.  I agree with the decision to increase his of aprepitant back to thousand milligrams twice daily and to make no other change in the carbamazepine, [name], Vimpat, or topiramate.  If he remains stable, we will plan to see him in 3 months.  I will respond once we know what his potassium level is to figure out how we need to supplement potassium.

## 2018-02-22 NOTE — Progress Notes (Signed)
Patient: [name] MRN: [id number] Sex: male DOB: 25-May-1986  Provider: Wyline Copas, MD Location of Care: [facility]  Note type: Routine return visit  History of Present Illness: Referral Source: Anastasia Pall, MD History from: both parents, patient and [facility] chart Chief Complaint: VNS/Seizures  [name] is a 32 y.o. male who returns on February 22, 2018, for the first time since December 10, 2017.  [name] has been a patient of mine since he was 51.  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    He has an implanted vagal nerve stimulator, which has helped seizure frequency.  He is on four antiepileptic medications.  We have tried to simplify his treatment regimen.    He had onset of a cluster of seizures on May 25th.  Leading up to this were a pair of seizures that occurred on Thursday and Friday.  His longest seizure began in late morning.  While sitting in a chair, he had onset of a seizure that did not respond to swiping of the vagal nerve stimulator.  He was tonically stiff and apneic.  He became cyanotic.  His mother called EMS.  He turned deeply cyanotic and gray.  She initiated mouth-to-mouth resuscitation.  She thinks he was not breathing for 7 to 8 minutes, but during that time, his heart rate continued but his mother was unable to feel a pulse.  It took EMS about 5 minutes to restore him to normal breathing.  He was awake, but poorly responsive in the ambulance and complained of a severe headache, which is not uncommon.  EMS was unable to find a vein for IV access.  His mother mentioned that he had a port and was able to access that.  He had gurgling respirations and was treated  with albuterol.  Fortunately, his seizure had stopped.  He was observed in the ED for about 3 hours and had taken his midday medications.  He suddenly vomited all of his medications and was treated with Zofran.  He then had onset of seizures and was administered Ativan three times and did not respond.  His parents told the ED physician that 65 mg of phenobarbital would work to stop his seizures.  His parents alleged that despite the fact that this was ordered stat, phenobarbital was not administered for 3-1/2 hours.  I do not know if this is true, but have no reason to dispute their observations.    He required intubation paralysis and assisted ventilation because of respiratory embarrassment.  When phenobarbital was finally given, seizures stopped.  He had evidence of an aspiration pneumonia.  He was given Keppra 1000 mg IV, and IV Vimpat.  Keppra had been dropped to 500 mg twice daily in an attempt to simplify his regimen.  He was hospitalized from Saturday through Wednesday and was extubated on Monday.  He continues to have a productive cough.  He apparently was catheterized and there was significant blood in his urine.  It apparently was a difficult catheterization and he may need to see a urologist for this.  One other problem is that his potassium was relatively low throughout the whole hospitalization.  His last determination was 2.8 five days ago.  He apparently had a seizure last Thursday that was able to be stopped with  vagal nerve stimulator and had an episode of unresponsive staring on Friday.  There have been no other seizures from then until today.  He returns for follow-up post hospitalization.  [name] is moving back to his baseline.  Cognitively, he is stable.  His appetite is good.  His sleep has been somewhat disturbed in part because of his coughing.  He had some increased swelling in his neck.  He has a problem with venous thrombosis within the deep veins in his chest.  This has not  been evaluated for several years since his old port was removed because of clotting and malfunction and a new port was placed in his chest at [facility].  I interrogated his vagal nerve stimulator but did not re-program it.  The device is working well.  The details are noted below.  Procedure: Interrogation and reprogramming of Vagal Nerve Stimulator  Implantation of the current stimulator 104 serial # [id number] implanted 04/19/14   Interrogation of the vagal nerve stimulator: Voltage 2.25 mA. Frequency 20 Hz, pulse width 250 microseconds, signal time on 30 seconds, signal time off 1.1 minutes, magnet current 2.50 mA, magnet signal time on 60 seconds, magnet pulse width 250 microseconds. I reprogrammed the computer for diagnostics.  The normal mode diagnostics using his current settings revealed the battery is nearly full, and communication, output, and impedance are good; 1759ohms. 1859stimuli since implantation, 195since December 10, 2017. There is no indication change the battery.  He tolerated the procedure well.  Review of Systems: A complete review of systems was remarkable for had one big seizure on May 25th followed by multiple seizures, he had to be ventolated, all other systems reviewed and negative.  Past Medical History Diagnosis Date  . ALLERGIC RHINITIS 12/26/2009  . Blood clot in vein   . Brown's syndrome   . Growth disorder   . Headache   . Heart murmur    moderate MR  . Hyperlipidemia    No therapy  . Hypokalemia   . Mental retardation, mild (I.Q. 50-70)   . MITRAL VALVE PROLAPSE 06/22/2007  . SEIZURE DISORDER 06/22/2007  . Sleep apnea    cpap , last sleep study 10/01/11  . Unspecified hypothyroidism 06/22/2007   Hospitalizations: Yes.  , Head Injury: No., Nervous System Infections: No., Immunizations up to date: Yes.    Diagnosis of developmental delay as a toddler.   EEG 11/07/86 was normal for gestational age.   Genetics evaluation  short stature, prenatal onset, chromosomal study: 53 XY, fragile X syndrome negative, evaluation for MELAS and MERRF were negative.  Initial seizure 08/28/94 left locomotor with secondary generalization.   MRI of the brain 09/13/93: Scattered subcortical white matter lesions at the parietotemporal parieto-occipital junction is ischemic versus hamartomas.   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   Diagnosis attention deficit disorder inattentive type treated without success with Cylert April 1995  MRI brain 04/04/94 stable differential diagnosis hamartoma, vasculitis, ischemic, or embolic phenomenon.   Status epilepticus 01/31/95, and 04/15/95 Neurontin added to Tegretol   MRI brain 06/14/96 unchanged   Admission to [facility]. Tegretol 30 mcg for militate. 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   Topiramate started in May 1999 unable to be tolerated with Tegretol and was tapered and discontinued.  January 2000 EEG showed right greater than left mid temporal sharp waves was otherwise well-organized EEG. He hospitalizations in March, July, October, in November for recurrent seizures.  Patient placed on Depakote in April 2000 and developed gagging abdominal pain headache and had significant change in transaminases and liver functions. Topamax was restarted and Keppra was added.  Vimpat was started July 16, 2012 and adjusted upwards.   [name] was added on November 18, 2012 and has been adjusted upwards. Topamax was changed to Trokendi XR in  attempt to deal with sleepiness, unsteadiness caused by polypharmacy. We to these medications have been introduced, the patient experiences somewhat improved seizure control however Is quite likely that despite better seizure control, the patient is experiencing impairment in cognition and gait as a result of polypharmacy .  MRI of the brain on March 21, 2013. The patient has extensive calcification to the basal ganglia bilaterally, normal ventricle size, and no acute findings. EKG was unchanged  Birth History He was a breech presentation, cesarean section delivery. He had intrauterine growth retardation, and failure to thrive.  Behavior History Mild intellectual disability  Surgical History Procedure Laterality Date  . ANKLE SURGERY Left   . EYE SURGERY     X2 for Brown Syndrome  . IMPLANTATION VAGAL NERVE STIMULATOR     Left  . PORTACATH PLACEMENT     and removal 04/08/12   Family History family history includes Breast cancer in his mother; Clotting disorder in his mother; Colon polyps in his father and mother; Congestive Heart Failure in his maternal grandfather; Coronary artery disease in his maternal grandfather and paternal grandfather; Diabetes in his father; Hyperlipidemia in his father; Hypertension in his father; Lung cancer in his paternal grandfather; Pneumonia in his maternal grandfather and paternal grandmother; Stroke in his paternal grandmother. Family history is negative for migraines, seizures, intellectual disabilities, blindness, deafness, birth defects, chromosomal disorder, or autism.  Social History Social History   Socioeconomic History  . Marital status: Single  . Years of education:  78  . Highest education level:  High school certificate  Occupational History  . Occupation: Disabled    Fish farm manager: UNEMPLOYED  Social Needs  . Financial resource strain: Not on file  . Food insecurity:    Worry: Not on file    Inability: Not on file  . Transportation  needs:    Medical: Not on file    Non-medical: Not on file  Tobacco Use  . Smoking status: Never Smoker  . Smokeless tobacco: Never Used  Substance and Sexual Activity  . Alcohol use: No    Alcohol/week: 0.0 oz  . Drug use: No  . 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     He lives with his parents.     He enjoys helping around the house, Nascar, and football.   Allergies Allergen Reactions  . Antihistamines, Chlorpheniramine-Type Other (See Comments)    Other Reaction: Dimetapp causes seizures  . Divalproex Sodium Other (See Comments)    Nausea, vomiting, liver and kidney dysfunction  . Phenytoin Rash and Other (See Comments)    Dilantin causes more seizures.   . Valproic Acid And Related Other (See Comments)  Shuts down systems  . Vancomycin Rash, Other (See Comments) and Swelling    If given too fast, causes red man reaction Other Reaction: Redman's Syndrome   Physical Exam BP 110/70   Pulse 68   Ht 5' 3.25" (1.607 m)   Wt 126 lb (57.2 kg)   BMI 22.14 kg/m   General: alert, well developed, well nourished, in no acute distress, sandy hair, hazel eyes, right handed Head: microcephalic, like nose, swollen face and neck right greater than left, torticollis right sternocleidomastoid Ears, Nose and Throat: Otoscopic: tympanic membranes normal; pharynx: oropharynx is pink without exudates or tonsillar hypertrophy Neck: supple, slightly diminished range of motion, no cranial or cervical bruits Respiratory: auscultation clear Cardiovascular: no murmurs, pulses are normal Musculoskeletal: no skeletal deformities or apparent scoliosis Skin: no rashes or neurocutaneous lesions  Neurologic Exam  Mental Status: alert; oriented to person; knowledge is below normal for age; language is adequate for conversation, naming objects and following commands;  dysarthria but intelligible Cranial Nerves: visual fields are full to double simultaneous stimuli; extraocular movements are full and dysconjugate; he has [employer] Syndrome which causes neuro palpebral facial and altered movement of his eyes; pupils are round reactive to light; funduscopic examination shows sharp disc margins with normal vessels; symmetric facial strength; midline tongue and uvula; air conduction is greater than bone conduction bilaterally Motor: Normal functional strength, tone and mass; clumsy fine motor movements; no pronator drift Sensory: intact responses to cold, vibration, proprioception and stereognosis Coordination: good finger-to-nose, clumsy rapid repetitive alternating movements and finger apposition Gait and Station: Broad-based but stable gait and station; Romberg exam is negative; Gower response is negative Reflexes: symmetric and diminished bilaterally; no clonus; bilateral flexor plantar responses  Assessment 1. 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 2. 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 3. 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 4. Migraine without aura without status migrainosus, not intractable, G43.009.  Discussion [name] has not had a problem with seizures like this for a couple of years.  I do not know if the lowering of levetiracetam, which took place months ago, has anything to do with this.  I am concerned about the statements of his parents made concerning the delays in receiving phenobarbital.  We will see if this can be substantiated, and if so, we need to understand why it took so long to get phenobarbital when it was requested.  I also need to speak with Dr. Pascal Lux who helped remove the previous port and did the venous testing that showed widespread clotting.  I think that we may need to do a venous study to look at his vessels in his chest and need to find from Dr. Pascal Lux the best way to accomplish this.  His  vagal nerve stimulator is working.  I agree with the need to increase levetiracetam back to 1000 mg twice daily and to make no other changes in his medicine.  Plan I did not need to refill any of his medications.  I await his potassium determination tomorrow to determine whether we need to increase his potassium.  He had been on potassium phosphate as well as Klor-Con.  We may need to restart the latter.  I spent 40 minutes of face-to-face time with the patient and in addition evaluated his vagal nerve stimulator, which is noted above.  He will return to see me in three months' time, but I will be in contact with the family, sooner based on clinical need.   Medication List    Accurate  as of 02/22/18 11:59 PM.      acetaminophen 500 MG tablet Commonly known as:  TYLENOL Take 1,000 mg by mouth every 6 (six) hours as needed for headache.   carbamazepine 100 MG chewable tablet Commonly known as:  TEGRETOL Chew 2 tablets in the morning, 2 tablets at midday and 1+1/2 tablets in the evening   cetirizine 10 MG tablet Commonly known as:  ZYRTEC Take 10 mg by mouth daily.   diazepam 2 MG tablet Commonly known as:  VALIUM Take 1 tablet every 6 - 8 hours as needed for anxiety or seizures   Flaxseed Oil 1000 MG Caps Take 1,000 mg by mouth daily.   guaiFENesin 600 MG 12 hr tablet Commonly known as:  MUCINEX Take 600 mg by mouth 2 (two) times daily.   hydrocortisone 25 MG suppository Commonly known as:  ANUSOL-HC Place 1 suppository (25 mg total) rectally at bedtime.   levETIRAcetam 1000 MG tablet Commonly known as:  KEPPRA Take 1 tablet (1,000 mg total) by mouth 2 (two) times daily.   levothyroxine 25 MCG tablet Commonly known as:  SYNTHROID, LEVOTHROID Take 25 mcg by mouth daily before breakfast.   LORazepam 2 MG/ML concentrated solution Commonly known as:  ATIVAN Withdraw 22ml (2mg ) and 44ml  Normal saline into 3 ml syringe. Administer IV push over 3-5 minutes as needed for recurrent  seizures   montelukast 10 MG tablet Commonly known as:  SINGULAIR Take 10 mg by mouth at bedtime.   multivitamin with minerals Tabs tablet Take 1 tablet by mouth daily.   OMEGA 3 PO Take 520 mg by mouth daily.   ondansetron 4 MG disintegrating tablet Commonly known as:  ZOFRAN-ODT Take 1 tablet as needed for nausea and vomiting   [name] 10 MG tablet Generic drug:  cloBAZam TAKE 1 TABLET BY MOUTH IN THE MORNING, 1 TAB IN THE AFTERNOON AND 2 TABLETS AT BEDTIME   PHENObarbital 65 MG/ML injection Commonly known as:  LUMINAL Withdraw 91ml (65mg ) Phenobarbital and 50ml Normal Saline into 36ml syringe. Adminster IV push for 3-5 minutes as needed for recurrent seizures   potassium chloride SA 20 MEQ tablet Commonly known as:  K-DUR,KLOR-CON Take 1 tablet (20 mEq total) by mouth daily.   potassium phosphate (monobasic) 500 MG tablet Commonly known as:  K-PHOS TAKE 1 TABLET (500mg ) BY MOUTH FOUR TIMES A DAY   promethazine 25 MG suppository Commonly known as:  PHENERGAN Place 1 suppository (25 mg total) rectally every 6 (six) hours as needed for nausea or vomiting.   rosuvastatin 10 MG tablet Commonly known as:  CRESTOR Take 10 mg by mouth at bedtime.   Topiramate ER 200 MG Cp24 Commonly known as:  TROKENDI XR Take 1 capsule by mouth at bedtime   Topiramate ER 50 MG Cp24 Commonly known as:  TROKENDI XR Take 2 capsules by mouth at bedtime.   VIMPAT 100 MG Tabs Generic drug:  Lacosamide Take 100 mg by mouth 2 (two) times daily.    The medication list was reviewed and reconciled. All changes or newly prescribed medications were explained.  A complete medication list was provided to the patient/caregiver.  Jodi Geralds MD

## 2018-03-03 ENCOUNTER — Ambulatory Visit (INDEPENDENT_AMBULATORY_CARE_PROVIDER_SITE_OTHER): Payer: Medicaid Other | Admitting: Family

## 2018-03-03 ENCOUNTER — Encounter (INDEPENDENT_AMBULATORY_CARE_PROVIDER_SITE_OTHER): Payer: Self-pay | Admitting: Family

## 2018-03-03 VITALS — BP 90/70 | HR 60 | Ht 63.25 in | Wt 129.4 lb

## 2018-03-03 DIAGNOSIS — G40109 Localization-related (focal) (partial) symptomatic epilepsy and epileptic syndromes with simple partial seizures, not intractable, without status epilepticus: Secondary | ICD-10-CM

## 2018-03-03 DIAGNOSIS — G40309 Generalized idiopathic epilepsy and epileptic syndromes, not intractable, without status epilepticus: Secondary | ICD-10-CM | POA: Diagnosis not present

## 2018-03-03 DIAGNOSIS — G40319 Generalized idiopathic epilepsy and epileptic syndromes, intractable, without status epilepticus: Secondary | ICD-10-CM | POA: Diagnosis not present

## 2018-03-03 DIAGNOSIS — E876 Hypokalemia: Secondary | ICD-10-CM

## 2018-03-03 DIAGNOSIS — G40119 Localization-related (focal) (partial) symptomatic epilepsy and epileptic syndromes with simple partial seizures, intractable, without status epilepticus: Secondary | ICD-10-CM

## 2018-03-03 DIAGNOSIS — G40209 Localization-related (focal) (partial) symptomatic epilepsy and epileptic syndromes with complex partial seizures, not intractable, without status epilepticus: Secondary | ICD-10-CM

## 2018-03-03 DIAGNOSIS — G4733 Obstructive sleep apnea (adult) (pediatric): Secondary | ICD-10-CM

## 2018-03-03 MED ORDER — POTASSIUM CHLORIDE CRYS ER 20 MEQ PO TBCR: 20.0000 meq | EXTENDED_RELEASE_TABLET | Freq: Every day | ORAL | 5 refills | Status: DC

## 2018-03-03 MED ORDER — LEVETIRACETAM 500 MG PO TABS: ORAL_TABLET | ORAL | 5 refills | Status: DC

## 2018-03-03 NOTE — Progress Notes (Signed)
Patient: [name] MRN: [id number] Sex: male DOB: [birth year]  Provider: Rockwell Germany, NP Location of Care: [facility]  Note type: Routine return visit  History of Present Illness: Referral Source: Anastasia Pall, MD History from: mother, patient and [facility] chart Chief Complaint: VNS/Seizures  [name] is a 32 y.o. 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 [name] was last seen September 09, 2017. He has vagal nerve stimulator for his seizures and is followed by Dr Gaynell Face and Dr Marcia Brash at [facility]. He is taking and tolerating Carbamazepine, Keppra, Vimpat, [name] and Trokendi XR for seizures. His parents give him Phenobarbital and Lorazepam through the portacath when he has a severe seizure. He also requires blow by oxygen when he has a seizure and sometimes rescue breathing due to apnea with a seizure. Mom tells me today that [name] had a severe seizure at the end of May that required rescue breathing. He was transported to the ER and admitted to ICU for several days because of need for respiratory support.   [name] was diagnosed with obstructive sleep apnea in 2013 and began CPAP therapy at that time. He has been maintained on 8cm H2O, which has worked well for him. [name] usually sleeps 9 hours using CPAP, and also uses it if he naps during the day. Mom says that his current nasal prongs need to be replaced and that he needs a smaller head strap but that otherwise he is doing well.   [name] has been approved for a seizure therapy dog and leaves next week to spend time at the facility that has been training the dog. He  will bring the dog home with him in 2 weeks, and [name] is very excited about that. [name] has been otherwise healthy since he was last seen. Mom has no have other health concerns for him today other than previously mentioned.  Review of Systems: Please see the HPI for neurologic and other pertinent review of systems. Otherwise, all other systems were reviewed and were negative.    Past Medical History:  Diagnosis Date  . ALLERGIC RHINITIS 12/26/2009  . Blood clot in vein   . Brown's syndrome   . Growth disorder   . Headache   . Heart murmur    moderate MR  . Hyperlipidemia    No therapy  . Hypokalemia   . Mental retardation, mild (I.Q. 50-70)   . MITRAL VALVE PROLAPSE 06/22/2007  . SEIZURE DISORDER 06/22/2007  . Sleep apnea    cpap , last sleep study 10/01/11  . Unspecified hypothyroidism 06/22/2007   Hospitalizations: No., Head Injury: No., Nervous System Infections: No., Immunizations up to date: Yes.   Past Medical History Comments: Diagnosis of developmental delay as a toddler.   EEG 11/07/86 was normal for gestational age.   Genetics evaluation short stature, prenatal onset, chromosomal study: 32 XY, fragile X syndrome negative, evaluation for MELAS and MERRF were negative.  Initial seizure 08/28/94 left locomotor with secondary generalization.   MRI of the brain 09/13/93: Scattered subcortical white matter lesions at the parietotemporal parieto-occipital junction is ischemic versus hamartomas.   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   Diagnosis attention deficit  disorder inattentive type treated without success with Cylert April 1995  MRI brain 04/04/94 stable differential diagnosis hamartoma, vasculitis, ischemic, or embolic phenomenon.   Status epilepticus 01/31/95, and 04/15/95 Neurontin added to Tegretol   MRI brain 06/14/96 unchanged   Admission to [facility]. Tegretol 30 mcg for  militate. 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   Topiramate started in May 1999 unable to be tolerated with Tegretol and was tapered and discontinued.  January 2000 EEG showed right greater than left mid temporal sharp waves was otherwise well-organized EEG. He hospitalizations in March, July, October, in November for recurrent seizures.  Patient placed on Depakote in April 2000 and developed gagging abdominal pain headache and had significant change in transaminases and liver functions. Topamax was restarted and Keppra was added.  Vimpat was started July 16, 2012 and adjusted upwards.   [name] was added on November 18, 2012 and has been adjusted upwards. Topamax was changed to Trokendi XR in attempt to deal with sleepiness, unsteadiness caused by polypharmacy. We to these medications have been introduced, the patient experiences somewhat improved seizure control however Is quite likely that despite better seizure control, the patient is experiencing impairment in cognition and gait as a result of polypharmacy .  MRI of the brain on March 21, 2013. The patient has extensive calcification to the basal ganglia bilaterally, normal ventricle size, and no acute findings. EKG was unchanged  Birth History He was a breech presentation, cesarean section delivery. He had intrauterine growth retardation, and failure to thrive.  Behavior History Mild intellectual disability  Surgical History Past Surgical History:  Procedure Laterality Date  . ANKLE SURGERY Left   . EYE SURGERY     X2 for Brown Syndrome  . IMPLANTATION VAGAL NERVE  STIMULATOR     Left  . PORTACATH PLACEMENT     and removal 04/08/12    Family History family history includes Breast cancer in his mother; Clotting disorder in his mother; Colon polyps in his father and mother; Congestive Heart Failure in his maternal grandfather; Coronary artery disease in his maternal grandfather and paternal grandfather; Diabetes in his father; Hyperlipidemia in his father; Hypertension in his father; Lung cancer in his paternal grandfather; Pneumonia in his maternal grandfather and paternal grandmother; Stroke in his paternal grandmother. Family History is otherwise negative for migraines, seizures, cognitive impairment, blindness, deafness, birth defects, chromosomal disorder, autism.  Social History Social History   Socioeconomic History  . Marital status: Single    Spouse name: Not on file  . Number of children: 0  . Years of education: Not on file  . Highest education level: Not on file  Occupational History  . Occupation: Disabled    Fish farm manager: UNEMPLOYED  Social Needs  . Financial resource strain: Not on file  . Food insecurity:    Worry: Not on file    Inability: Not on file  . Transportation needs:    Medical: Not on file    Non-medical: Not on file  Tobacco Use  . Smoking status: Never Smoker  . Smokeless tobacco: Never Used  Substance and Sexual Activity  . Alcohol use: No    Alcohol/week: 0.0  oz  . Drug use: No  . Sexual activity: Never  Lifestyle  . Physical activity:    Days per week: Not on file    Minutes per session: Not on file  . Stress: Not on file  Relationships  . Social connections:    Talks on phone: Not on file    Gets together: Not on file    Attends religious service: Not on file    Active member of club or organization: Not on file    Attends meetings of clubs or organizations: Not on file    Relationship status: Not on file  Other Topics Concern  . 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    He lives with his parents.    He enjoys helping around the house, Nascar, and football.    Allergies Allergies  Allergen Reactions  . Antihistamines, Chlorpheniramine-Type Other (See Comments)    Other Reaction: Dimetapp causes seizures  . Divalproex Sodium Other (See Comments)    Nausea, vomiting, liver and kidney dysfunction  . Phenytoin Rash and Other (See Comments)    Dilantin causes more seizures.   . Valproic Acid And Related Other (See Comments)    Shuts down systems  . Vancomycin Rash, Other (See Comments) and Swelling    If given too fast, causes red man reaction Other Reaction: Redman's Syndrome    Physical Exam BP 90/70   Pulse 60   Ht 5' 3.25" (1.607 m)   Wt 129 lb 6.4 oz (58.7 kg)   BMI 22.74 kg/m  General: small statured, well nourished man, seated on exam table, in no evident distress; sandy hair, hazel eyes, right handed Head: microcephalic and atraumatic. Has beak-like nose, low lying palate, oropharynx benign other than bilateral tympanic membranes being occluded by cerumen. His head is turned to the right with his right ear on his right shoulder, and chin points to the left. His face and neck are symmetrically swollen left greater than right. Neck: supple with no carotid bruits. No focal tenderness. Cardiovascular: regular rate and rhythm, no murmurs. Respiratory: Clear to auscultation bilaterally Abdomen: Bowel sounds present all four quadrants, abdomen soft, non-tender, non-distended.  Musculoskeletal: No skeletal deformities. Has kyphosis and bilateral tight heel cords.  Skin: no rashes or neurocutaneous lesions  Neurologic Exam Mental Status: Awake and fully alert.  Attention span, concentration, and fund of knowledge subnormal for age.  Speech with mild dysarthria, language is below normal for age.  Able to follow commands and participate in examination. Cranial Nerves:  Fundoscopic exam - red reflex present.  Unable to fully visualize fundus.  Pupils equal briskly reactive to light.  Extraocular movements full and dysconjugate. 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  Visual fields full to confrontation.  Hearing intact and symmetric to finger rub.  Facial sensation intact.  Face, tongue, palate move normally and symmetrically.  Neck flexion and extension normal. Motor: Functional bulk, tone and strength in all tested extremities, clumsy fine motor movements, no pronator drift.  Sensory: Intact to touch and temperature in all extremities. Coordination: Good finger to nose and repetitive alternating movements. Finger opposition is clumsy. Gait and Station: Broad based antalgic gait Reflexes: Diminished and symmetric. Toes downgoing. No clonus.  Impression 1.  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  Partial epilepsy with intractable epilepsy, G40.119 3.  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  Obstructive sleep apnea, F47.33 6.  Migraine without aura, G43.009  Recommendations for plan of care The patient's previous [facility] records were reviewed. [name] has neither had nor required imaging or lab studies since the last visit other than what was performed at his recent hospitalization. 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 [name] uses CPAP therapy for sleep apnea and is doing well on his current settings. He is taking and tolerating Carbamazepine, Keppra, Vimpat, [name] and Trokendi XR for his seizure disorder. [name] will  continue his medications and current CPAP therapy without change for now. I will see him back in follow up for obstructive sleep apnea in 6 months or sooner if needed. Mom agreed with the plans made today.   The medication list was reviewed and reconciled.  No changes were made in the prescribed medications today.  A complete medication list was provided to his mother.  Allergies as of 03/03/2018      Reactions   Antihistamines, Chlorpheniramine-type Other (See Comments)   Other Reaction: Dimetapp causes seizures   Divalproex Sodium Other (See Comments)   Nausea, vomiting, liver and kidney dysfunction   Phenytoin Rash, Other (See Comments)   Dilantin causes more seizures.   Valproic Acid And Related Other (See Comments)   Shuts down systems   Vancomycin Rash, Other (See Comments), Swelling   If given too fast, causes red man reaction Other Reaction: Redman's Syndrome      Medication List        Accurate as of 03/03/18  8:46 AM. Always use your most recent med list.          acetaminophen 500 MG tablet Commonly known as:  TYLENOL Take 1,000 mg by mouth every 6 (six) hours as needed for headache.   carbamazepine 100 MG chewable tablet Commonly known as:  TEGRETOL Chew 2 tablets in the morning, 2 tablets at midday and 1+1/2 tablets in the evening   cetirizine 10 MG tablet Commonly known as:  ZYRTEC Take 10 mg by mouth daily.   diazepam 2 MG tablet Commonly known as:  VALIUM Take 1 tablet every 6 - 8 hours as needed for anxiety or seizures   Flaxseed Oil 1000 MG Caps Take 1,000 mg by mouth daily.   guaiFENesin 600 MG 12 hr tablet Commonly known as:  MUCINEX Take 600 mg by mouth 2 (two) times daily.   hydrocortisone 25 MG suppository Commonly known as:  ANUSOL-HC Place 1 suppository (25 mg total) rectally at bedtime.   levETIRAcetam 1000 MG tablet Commonly known as:  KEPPRA Take 1 tablet (1,000 mg total) by mouth 2 (two) times daily.   levothyroxine 25 MCG  tablet Commonly known as:  SYNTHROID, LEVOTHROID Take 25 mcg by mouth daily before breakfast.   LORazepam 2 MG/ML concentrated solution Commonly known as:  ATIVAN Withdraw 54ml (2mg ) and 76ml  Normal saline into 3 ml syringe. Administer IV push over 3-5 minutes as needed for recurrent seizures   montelukast 10 MG tablet Commonly known as:  SINGULAIR Take 10 mg by mouth at bedtime.   multivitamin with minerals Tabs tablet Take 1 tablet by mouth daily.  OMEGA 3 PO Take 520 mg by mouth daily.   ondansetron 4 MG disintegrating tablet Commonly known as:  ZOFRAN-ODT Take 1 tablet as needed for nausea and vomiting   [name] 10 MG tablet Generic drug:  cloBAZam TAKE 1 TABLET BY MOUTH IN THE MORNING, 1 TAB IN THE AFTERNOON AND 2 TABLETS AT BEDTIME   PHENObarbital 65 MG/ML injection Commonly known as:  LUMINAL Withdraw 31ml (65mg ) Phenobarbital and 64ml Normal Saline into 85ml syringe. Adminster IV push for 3-5 minutes as needed for recurrent seizures   potassium chloride SA 20 MEQ tablet Commonly known as:  K-DUR,KLOR-CON Take 1 tablet (20 mEq total) by mouth daily.   potassium phosphate (monobasic) 500 MG tablet Commonly known as:  K-PHOS TAKE 1 TABLET (500mg ) BY MOUTH FOUR TIMES A DAY   promethazine 25 MG suppository Commonly known as:  PHENERGAN Place 1 suppository (25 mg total) rectally every 6 (six) hours as needed for nausea or vomiting.   rosuvastatin 10 MG tablet Commonly known as:  CRESTOR Take 10 mg by mouth at bedtime.   Topiramate ER 200 MG Cp24 Commonly known as:  TROKENDI XR Take 1 capsule by mouth at bedtime   Topiramate ER 50 MG Cp24 Commonly known as:  TROKENDI XR Take 2 capsules by mouth at bedtime.   VIMPAT 100 MG Tabs Generic drug:  Lacosamide Take 100 mg by mouth 2 (two) times daily.      Total time spent with the patient was 30 minutes, of which 50% or more was spent in counseling and coordination of care.   Rockwell Germany NP-C

## 2018-03-04 ENCOUNTER — Telehealth: Payer: Self-pay | Admitting: Family

## 2018-03-04 DIAGNOSIS — G40119 Localization-related (focal) (partial) symptomatic epilepsy and epileptic syndromes with simple partial seizures, intractable, without status epilepticus: Secondary | ICD-10-CM

## 2018-03-04 DIAGNOSIS — G40309 Generalized idiopathic epilepsy and epileptic syndromes, not intractable, without status epilepticus: Secondary | ICD-10-CM

## 2018-03-04 DIAGNOSIS — G40109 Localization-related (focal) (partial) symptomatic epilepsy and epileptic syndromes with simple partial seizures, not intractable, without status epilepticus: Secondary | ICD-10-CM

## 2018-03-04 DIAGNOSIS — G40209 Localization-related (focal) (partial) symptomatic epilepsy and epileptic syndromes with complex partial seizures, not intractable, without status epilepticus: Secondary | ICD-10-CM

## 2018-03-04 DIAGNOSIS — G40319 Generalized idiopathic epilepsy and epileptic syndromes, intractable, without status epilepticus: Secondary | ICD-10-CM

## 2018-03-04 MED ORDER — LEVETIRACETAM 500 MG PO TABS: ORAL_TABLET | ORAL | 5 refills | Status: DC

## 2018-03-04 NOTE — Telephone Encounter (Signed)
°  Who's calling (name and relationship to patient) : Cayabyab,Doris (Mother)  Best contact number: [phone] (M)  Provider they see: Otila Kluver  Reason for call: Mother stated that patient had a seizure and 911 was called. EMS personnel's were able to assist with breathing and patient was not taken to the emergency room however the mother wants to know is it okay to increase patients Kepra?

## 2018-03-04 NOTE — Telephone Encounter (Signed)
Let us increase to 2-1/2 tablets twice daily.  I wrote a prescription.  I called mother.

## 2018-03-07 ENCOUNTER — Encounter (INDEPENDENT_AMBULATORY_CARE_PROVIDER_SITE_OTHER): Payer: Self-pay | Admitting: Family

## 2018-03-07 NOTE — Patient Instructions (Signed)
Thank you for coming in today.   Instructions for you until your next appointment are as follows: 1. Continue taking your seizure medications as you have been taking them.  2. Let Dr Gaynell Face or Dr Marcia Brash know if your seizures become more frequent or more severe 3. Continue using your CPAP as you have been doing. I will contact [facility] to request a new nasal prong and head strap for you. 4.  Please plan to return for follow up in 6 months or sooner if needed.

## 2018-03-12 ENCOUNTER — Ambulatory Visit (INDEPENDENT_AMBULATORY_CARE_PROVIDER_SITE_OTHER): Payer: Medicaid Other | Admitting: Family

## 2018-03-12 ENCOUNTER — Ambulatory Visit (INDEPENDENT_AMBULATORY_CARE_PROVIDER_SITE_OTHER): Payer: Medicaid Other | Admitting: Pediatrics

## 2018-03-23 ENCOUNTER — Ambulatory Visit (INDEPENDENT_AMBULATORY_CARE_PROVIDER_SITE_OTHER): Payer: Medicaid Other | Admitting: Pediatrics

## 2018-04-06 ENCOUNTER — Ambulatory Visit (INDEPENDENT_AMBULATORY_CARE_PROVIDER_SITE_OTHER): Payer: Medicaid Other | Admitting: Gastroenterology

## 2018-04-06 ENCOUNTER — Encounter: Payer: Self-pay | Admitting: Gastroenterology

## 2018-04-06 VITALS — BP 112/64 | HR 70 | Ht 63.25 in | Wt 130.0 lb

## 2018-04-06 DIAGNOSIS — K625 Hemorrhage of anus and rectum: Secondary | ICD-10-CM

## 2018-04-06 DIAGNOSIS — K5909 Other constipation: Secondary | ICD-10-CM | POA: Diagnosis not present

## 2018-04-06 DIAGNOSIS — K648 Other hemorrhoids: Secondary | ICD-10-CM | POA: Diagnosis not present

## 2018-04-06 NOTE — Progress Notes (Signed)
[name]    [id number]    [birth year]  Primary Care Physician:Badger, Rebeca Alert, MD  Referring Physician: Chesley Noon, [place], [place] [address]  Chief complaint: Constipation, intermittent bright red blood per rectum  HPI:  32 year old very pleasant male with mild intellectual disability, seizure disorder is here for follow-up visit accompanied by his parents.  He was last seen in office January 11, 2018. No longer frequent episodes of blood per rectum, significantly improved after using Anusol suppository for 2 to 3 days.  He is currently not taking any fiber supplements.  His mom did notice small volume of bright red blood on wiping after BM on few occasions in the past month when he strains excessively during bowel movement.  Denies any rectal discomfort or pain.  No loss of appetite or weight loss.  He is excited about his new service dog who accompanied him for this office visit.   Outpatient Encounter Medications as of 04/06/2018  Medication Sig  . acetaminophen (TYLENOL) 500 MG tablet Take 1,000 mg by mouth every 6 (six) hours as needed for headache.  . carbamazepine (TEGRETOL) 100 MG chewable tablet Chew 2 tablets in the morning, 2 tablets at midday and 1+1/2 tablets in the evening (Patient taking differently: Chew 150-200 mg by mouth See admin instructions. Chew 2 tablets(200mg ) every morning and every afternoon and 1 1/2 tablets (150 mg) every night)  . cetirizine (ZYRTEC) 10 MG tablet Take 10 mg by mouth daily.  . diazepam (VALIUM) 2 MG tablet Take 1 tablet every 6 - 8 hours as needed for anxiety or seizures (Patient taking differently: Take 2 mg by mouth every 6 (six) hours as needed for anxiety (seizures). )  . Flaxseed, Linseed, (FLAXSEED OIL) 1000 MG CAPS Take 1,000 mg by mouth daily.  Marland Kitchen guaiFENesin (MUCINEX) 600 MG 12 hr tablet Take 600 mg by mouth 2 (two) times daily.  . hydrocortisone (ANUSOL-HC) 25 MG suppository Place  1 suppository (25 mg total) rectally at bedtime. (Patient taking differently: Place 25 mg rectally at bedtime as needed for hemorrhoids or anal itching. )  . Lacosamide (VIMPAT) 100 MG TABS Take 100 mg by mouth 2 (two) times daily.  Marland Kitchen levETIRAcetam (KEPPRA) 500 MG tablet Take 2 1/2 tablets by mouth twice per day (Patient taking differently: Take 2 tablets in the morning and 2 1/2 tablets by mouth at bedtime)  . levothyroxine (SYNTHROID, LEVOTHROID) 25 MCG tablet Take 25 mcg by mouth daily before breakfast.   . LORazepam (ATIVAN) 2 MG/ML concentrated solution Withdraw 89ml (2mg ) and 19ml  Normal saline into 3 ml syringe. Administer IV push over 3-5 minutes as needed for recurrent seizures (Patient taking differently: 2 mg See admin instructions. Withdraw 59ml (2mg ) and 67ml  Normal saline into 3 ml syringe. Administer IV push over 3-5 minutes as needed for recurrent seizures)  . montelukast (SINGULAIR) 10 MG tablet Take 10 mg by mouth at bedtime.  . Multiple Vitamin (MULTIVITAMIN WITH MINERALS) TABS Take 1 tablet by mouth daily.  . Omega-3 Fatty Acids (OMEGA 3 PO) Take 520 mg by mouth daily.  . ondansetron (ZOFRAN-ODT) 4 MG disintegrating tablet Take 1 tablet as needed for nausea and vomiting (Patient taking differently: Take 4 mg by mouth every 8 (eight) hours as needed (nausea and vomiting related to migraines). )  . [name] 10 MG tablet TAKE 1 TABLET BY MOUTH IN THE MORNING, 1 TAB IN THE AFTERNOON  AND 2 TABLETS AT BEDTIME  . PHENObarbital (LUMINAL) 65 MG/ML injection Withdraw 26ml (65mg ) Phenobarbital and 76ml Normal Saline into 58ml syringe. Adminster IV push for 3-5 minutes as needed for recurrent seizures (Patient taking differently: 65 mg See admin instructions. Withdraw 81ml (65mg ) Phenobarbital and 72ml Normal Saline into 31ml syringe. Adminster IV push for 3-5 minutes as needed for recurrent seizures)  . potassium chloride SA (K-DUR,KLOR-CON) 20 MEQ tablet Take 1 tablet (20 mEq total) by mouth daily.  .  potassium phosphate, monobasic, (K-PHOS) 500 MG tablet TAKE 1 TABLET (500mg ) BY MOUTH FOUR TIMES A DAY (Patient taking differently: Take 500 mg by mouth 4 (four) times daily. )  . promethazine (PHENERGAN) 25 MG suppository Place 1 suppository (25 mg total) rectally every 6 (six) hours as needed for nausea or vomiting.  . rosuvastatin (CRESTOR) 10 MG tablet Take 10 mg by mouth at bedtime.  . Topiramate ER (TROKENDI XR) 200 MG CP24 Take 1 capsule by mouth at bedtime (Patient taking differently: Take 200 mg by mouth See admin instructions. Take 1 capsule (200 mg) by mouth with a 50 mg capsule daily at bedtime (total dose 250 mg))  . Topiramate ER (TROKENDI XR) 50 MG CP24 Take 2 capsules by mouth at bedtime. (Patient taking differently: Take 50 mg by mouth See admin instructions. Take 1 capsule (50 mg) by mouth with a 200 mg capsule daily at bedtime (total dose 250 mg))   No facility-administered encounter medications on file as of 04/06/2018.     Allergies as of 04/06/2018 - Review Complete 04/06/2018  Allergen Reaction Noted  . Antihistamines, chlorpheniramine-type Other (See Comments) 04/21/2014  . Divalproex sodium Other (See Comments)   . Phenytoin Rash and Other (See Comments)   . Valproic acid and related Other (See Comments) 04/21/2014  . Vancomycin Rash, Other (See Comments), and Swelling 01/08/2012    Past Medical History:  Diagnosis Date  . ALLERGIC RHINITIS 12/26/2009  . Blood clot in vein   . Brown's syndrome   . Growth disorder   . Headache   . Heart murmur    moderate MR  . Hyperlipidemia    No therapy  . Hypokalemia   . Mental retardation, mild (I.Q. 50-70)   . MITRAL VALVE PROLAPSE 06/22/2007  . SEIZURE DISORDER 06/22/2007  . Sleep apnea    cpap , last sleep study 10/01/11  . Unspecified hypothyroidism 06/22/2007    Past Surgical History:  Procedure Laterality Date  . ANKLE SURGERY Left   . EYE SURGERY     X2 for Brown Syndrome  . IMPLANTATION VAGAL NERVE STIMULATOR       Left  . PORTACATH PLACEMENT     and removal 04/08/12    Family History  Problem Relation Age of Onset  . Breast cancer Mother   . Colon polyps Mother   . Clotting disorder Mother   . Diabetes Father   . Hypertension Father   . Colon polyps Father   . Hyperlipidemia Father   . Coronary artery disease Maternal Grandfather   . Congestive Heart Failure Maternal Grandfather        Died at 61  . Pneumonia Maternal Grandfather   . Coronary artery disease Paternal Grandfather   . Lung cancer Paternal Grandfather        Died at 75  . Pneumonia Paternal Grandmother        Died at 12  . Stroke Paternal Grandmother     Social History   Socioeconomic History  . Marital  status: Single    Spouse name: Not on file  . Number of children: 0  . Years of education: Not on file  . Highest education level: Not on file  Occupational History  . Occupation: Disabled    Fish farm manager: UNEMPLOYED  Social Needs  . Financial resource strain: Not on file  . Food insecurity:    Worry: Not on file    Inability: Not on file  . Transportation needs:    Medical: Not on file    Non-medical: Not on file  Tobacco Use  . Smoking status: Never Smoker  . Smokeless tobacco: Never Used  Substance and Sexual Activity  . Alcohol use: No    Alcohol/week: 0.0 oz  . Drug use: No  . Sexual activity: Never  Lifestyle  . Physical activity:    Days per week: Not on file    Minutes per session: Not on file  . Stress: Not on file  Relationships  . Social connections:    Talks on phone: Not on file    Gets together: Not on file    Attends religious service: Not on file    Active member of club or organization: Not on file    Attends meetings of clubs or organizations: Not on file    Relationship status: Not on file  . Intimate partner violence:    Fear of current or ex partner: Not on file    Emotionally abused: Not on file    Physically abused: Not on file    Forced sexual activity: Not on file  Other  Topics Concern  . 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    He lives with his parents.    He enjoys helping around the house, Nascar, and football.      Review of systems: Review of Systems  Constitutional: Negative for fever and chills.  HENT: Positive for postnasal drip and runny nose Eyes: Negative for blurred vision.  Respiratory: Negative for cough, shortness of breath and wheezing.   Cardiovascular: Negative for chest pain and palpitations.  History of mitral valve prolapse Gastrointestinal: as per HPI Genitourinary: Negative for dysuria, urgency, frequency and hematuria.  Musculoskeletal: Negative for myalgias, back pain and joint pain.  Skin: Negative for itching and rash.  Neurological: Negative for dizziness, tremors, focal weakness, and loss of consciousness.  Positive for seizures and migraines Endo/Heme/Allergies: Positive for seasonal allergies.  Psychiatric/Behavioral: Negative for depression, suicidal ideas and hallucinations.  All other systems reviewed and are negative.   Physical Exam: Vitals:   04/06/18 1013  BP: 112/64  Pulse: 70   Body mass index is 22.85 kg/m. Gen:      No acute distress, petite HEENT:  EOMI, sclera anicteric Lungs:    Clear to auscultation bilaterally; normal respiratory effort CV:         Regular rate and rhythm; no murmurs Abd:      + bowel sounds; soft, non-tender; no palpable masses, no distension Ext:    No edema; adequate peripheral perfusion Skin:      Warm and dry; no rash Neuro: alert and oriented x 3 Psych: normal mood and affect  Data Reviewed:  Reviewed labs, radiology imaging, old records and pertinent past GI work up   Assessment and Plan/Recommendations: 32 year old very pleasant male with mild intellectual disability and seizure disorder here for follow-up visit Small volume  bright red blood per rectum  secondary to bleeding internal hemorrhoids improved Advised to avoid excessive straining with bowel movement Start Benefiber or soluble fiber supplements 1 tablet/ teaspoon up to 3 times daily with meals Increase fluid intake to 8 to 10 cups of water daily Okay to use Anusol suppository as needed at bedtime but avoid continuous use for more than 7 days time Return as needed   K. Denzil Magnuson , MD [phone]    CC: Chesley Noon, MD

## 2018-04-06 NOTE — Patient Instructions (Signed)
Use Benefiber 1 tablespoon three times a day  Follow up as needed  If you are age 31 or older, your body mass index should be between 23-30. Your Body mass index is 22.85 kg/m. If this is out of the aforementioned range listed, please consider follow up with your Primary Care Provider.  If you are age 82 or younger, your body mass index should be between 19-25. Your Body mass index is 22.85 kg/m. If this is out of the aformentioned range listed, please consider follow up with your Primary Care Provider.

## 2018-04-07 ENCOUNTER — Other Ambulatory Visit (INDEPENDENT_AMBULATORY_CARE_PROVIDER_SITE_OTHER): Payer: Self-pay | Admitting: Pediatrics

## 2018-04-07 ENCOUNTER — Telehealth (INDEPENDENT_AMBULATORY_CARE_PROVIDER_SITE_OTHER): Payer: Self-pay | Admitting: Pediatrics

## 2018-04-07 DIAGNOSIS — G40401 Other generalized epilepsy and epileptic syndromes, not intractable, with status epilepticus: Secondary | ICD-10-CM

## 2018-04-07 DIAGNOSIS — G40319 Generalized idiopathic epilepsy and epileptic syndromes, intractable, without status epilepticus: Secondary | ICD-10-CM

## 2018-04-07 MED ORDER — LORAZEPAM 2 MG/ML PO CONC
ORAL | 5 refills | Status: DC
Start: 2018-04-07 — End: 2018-10-14

## 2018-04-07 MED ORDER — PHENOBARBITAL SODIUM 65 MG/ML IJ SOLN: INTRAMUSCULAR | 5 refills | Status: DC

## 2018-04-07 NOTE — Telephone Encounter (Signed)
°  Who's calling (name and relationship to patient) : Jeani Hawking (Bell) Best contact number: [phone] Provider they see: Dr. Gaynell Face  Reason for call: Jeani Hawking called to get scripts for Lorazepam and Phenobarbital.

## 2018-04-07 NOTE — Telephone Encounter (Signed)
Rx has been faxed to the pharmacy 

## 2018-04-08 NOTE — Telephone Encounter (Signed)
Pharm stated rx's need to be faxed to the number provided:  (F) [phone]

## 2018-04-08 NOTE — Telephone Encounter (Signed)
RX has been faxed to the correct fax number

## 2018-04-11 ENCOUNTER — Telehealth (INDEPENDENT_AMBULATORY_CARE_PROVIDER_SITE_OTHER): Payer: Self-pay | Admitting: Pediatrics

## 2018-04-11 DIAGNOSIS — G40319 Generalized idiopathic epilepsy and epileptic syndromes, intractable, without status epilepticus: Secondary | ICD-10-CM

## 2018-04-11 MED ORDER — ONFI 10 MG PO TABS: ORAL_TABLET | ORAL | 5 refills | Status: DC

## 2018-04-11 NOTE — Telephone Encounter (Signed)
[facility] contacted on call provider,patient is out of [name].  Pharmacy confirmed and prescription sent.    Hervey Ard MD MPH

## 2018-04-12 ENCOUNTER — Telehealth (INDEPENDENT_AMBULATORY_CARE_PROVIDER_SITE_OTHER): Payer: Self-pay | Admitting: Family

## 2018-04-12 NOTE — Telephone Encounter (Signed)
Who's calling (name and relationship to patient) : Merlin contact number: [phone]  Provider they see: Good[place]ure  Reason for call: Caller states [name] w/[facility], [phone], pt is out of [name] seizure meds for a few days; sees Rockwell Germany, NP, not listed. (not ped but all hard copies of RXs have come from this office)  Call ID: [phone]  Dr Rogers Blocker called in medication to [facility]  Name of prescription:  Pharmacy:

## 2018-04-13 ENCOUNTER — Encounter (INDEPENDENT_AMBULATORY_CARE_PROVIDER_SITE_OTHER): Payer: Self-pay | Admitting: Family

## 2018-04-13 NOTE — Telephone Encounter (Signed)
A user error has taken place: encounter opened in error, closed for administrative reasons.

## 2018-04-14 ENCOUNTER — Telehealth (INDEPENDENT_AMBULATORY_CARE_PROVIDER_SITE_OTHER): Payer: Self-pay | Admitting: Pediatrics

## 2018-04-14 DIAGNOSIS — G40319 Generalized idiopathic epilepsy and epileptic syndromes, intractable, without status epilepticus: Secondary | ICD-10-CM

## 2018-04-14 MED ORDER — ONFI 10 MG PO TABS: ORAL_TABLET | ORAL | 5 refills | Status: DC

## 2018-04-14 NOTE — Telephone Encounter (Signed)
Rx has been faxed to the pharmacy 

## 2018-05-26 ENCOUNTER — Encounter (INDEPENDENT_AMBULATORY_CARE_PROVIDER_SITE_OTHER): Payer: Self-pay | Admitting: Pediatrics

## 2018-05-26 ENCOUNTER — Ambulatory Visit (INDEPENDENT_AMBULATORY_CARE_PROVIDER_SITE_OTHER): Payer: Medicaid Other | Admitting: Pediatrics

## 2018-05-26 VITALS — BP 110/70 | HR 68 | Ht 63.25 in | Wt 131.0 lb

## 2018-05-26 DIAGNOSIS — G40319 Generalized idiopathic epilepsy and epileptic syndromes, intractable, without status epilepticus: Secondary | ICD-10-CM | POA: Diagnosis not present

## 2018-05-26 DIAGNOSIS — R2689 Other abnormalities of gait and mobility: Secondary | ICD-10-CM

## 2018-05-26 DIAGNOSIS — G40119 Localization-related (focal) (partial) symptomatic epilepsy and epileptic syndromes with simple partial seizures, intractable, without status epilepticus: Secondary | ICD-10-CM

## 2018-05-26 DIAGNOSIS — G43009 Migraine without aura, not intractable, without status migrainosus: Secondary | ICD-10-CM

## 2018-05-26 DIAGNOSIS — E876 Hypokalemia: Secondary | ICD-10-CM

## 2018-05-26 MED ORDER — POTASSIUM PHOSPHATE MONOBASIC 500 MG PO TABS: ORAL_TABLET | ORAL | 4 refills | Status: DC

## 2018-05-26 NOTE — Progress Notes (Deleted)
Patient: [name] MRN: [id number] Sex: male DOB: 1986-01-31  Provider: Wyline Copas, MD Location of Care: [facility]  Note type: Routine return visit  History of Present Illness: Referral Source: Anastasia Pall, MD History from: mother, patient and [facility] chart Chief Complaint: VNS/Seizures  [name] is a 32 y.o. male who ***  Review of Systems: A complete review of systems was remarkable for mom reports that patient has had four seizures since last visit. She stated that he has also been off balance for a while now, all other systems reviewed and negative.  Past Medical History Past Medical History:  Diagnosis Date  . ALLERGIC RHINITIS 12/26/2009  . Blood clot in vein   . Brown's syndrome   . Growth disorder   . Headache   . Heart murmur    moderate MR  . Hyperlipidemia    No therapy  . Hypokalemia   . Mental retardation, mild (I.Q. 50-70)   . MITRAL VALVE PROLAPSE 06/22/2007  . SEIZURE DISORDER 06/22/2007  . Sleep apnea    cpap , last sleep study 10/01/11  . Unspecified hypothyroidism 06/22/2007   Hospitalizations: No., Head Injury: No., Nervous System Infections: No., Immunizations up to date: Yes.    ***  Birth History *** lbs. *** oz. infant born at *** weeks gestational age to a *** year old g *** p *** *** *** *** male. Gestation was {Complicated/Uncomplicated HBZJIRCVE:93810} Mother received {CN Delivery analgesics:210120005}  {method of delivery:313099} Nursery Course was {Complicated/Uncomplicated:20316} Growth and Development was {cn recall:210120004}  Behavior History {Symptoms; behavioral problems:18883}  Surgical History Past Surgical History:  Procedure Laterality Date  . ANKLE SURGERY Left   . EYE SURGERY     X2 for Brown Syndrome  . IMPLANTATION VAGAL NERVE STIMULATOR     Left  . PORTACATH PLACEMENT     and removal 04/08/12    Family History family history includes Breast cancer in his  mother; Clotting disorder in his mother; Colon polyps in his father and mother; Congestive Heart Failure in his maternal grandfather; Coronary artery disease in his maternal grandfather and paternal grandfather; Diabetes in his father; Hyperlipidemia in his father; Hypertension in his father; Lung cancer in his paternal grandfather; Pneumonia in his maternal grandfather and paternal grandmother; Stroke in his paternal grandmother. Family history is negative for migraines, seizures, intellectual disabilities, blindness, deafness, birth defects, chromosomal disorder, or autism.  Social History Social History   Socioeconomic History  . Marital status: Single    Spouse name: Not on file  . Number of children: 0  . Years of education: Not on file  . Highest education level: Not on file  Occupational History  . Occupation: Disabled    Fish farm manager: UNEMPLOYED  Social Needs  . Financial resource strain: Not on file  . Food insecurity:    Worry: Not on file    Inability: Not on file  . Transportation needs:    Medical: Not on file    Non-medical: Not on file  Tobacco Use  . Smoking status: Never Smoker  . Smokeless tobacco: Never Used  Substance and Sexual Activity  . Alcohol use: No    Alcohol/week: 0.0 standard drinks  . Drug use: No  . Sexual activity: Never  Lifestyle  . Physical activity:    Days per week: Not on file    Minutes per session: Not on file  . Stress: Not on file  Relationships  . Social connections:    Talks  on phone: Not on file    Gets together: Not on file    Attends religious service: Not on file    Active member of club or organization: Not on file    Attends meetings of clubs or organizations: Not on file    Relationship status: Not on file  Other Topics Concern  . 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    He lives with his parents.    He enjoys helping  around the house, Nascar, and football.     Allergies Allergies  Allergen Reactions  . Antihistamines, Chlorpheniramine-Type Other (See Comments)    Other Reaction: Dimetapp causes seizures  . Divalproex Sodium Other (See Comments)    Nausea, vomiting, liver and kidney dysfunction  . Phenytoin Rash and Other (See Comments)    Dilantin causes more seizures.   . Valproic Acid And Related Other (See Comments)    Shuts down systems  . Vancomycin Rash, Other (See Comments) and Swelling    If given too fast, causes red man reaction Other Reaction: Redman's Syndrome    Physical Exam BP 110/70   Pulse 68   Ht 5' 3.25" (1.607 m)   Wt 131 lb (59.4 kg)   BMI 23.02 kg/m   ***   Assessment   Discussion   Plan  Allergies as of 05/26/2018      Reactions   Antihistamines, Chlorpheniramine-type Other (See Comments)   Other Reaction: Dimetapp causes seizures   Divalproex Sodium Other (See Comments)   Nausea, vomiting, liver and kidney dysfunction   Phenytoin Rash, Other (See Comments)   Dilantin causes more seizures.   Valproic Acid And Related Other (See Comments)   Shuts down systems   Vancomycin Rash, Other (See Comments), Swelling   If given too fast, causes red man reaction Other Reaction: Redman's Syndrome      Medication List        Accurate as of 05/26/18 11:09 AM. Always use your most recent med list.          acetaminophen 500 MG tablet Commonly known as:  TYLENOL Take 1,000 mg by mouth every 6 (six) hours as needed for headache.   carbamazepine 100 MG chewable tablet Commonly known as:  TEGRETOL Chew 2 tablets in the morning, 2 tablets at midday and 1+1/2 tablets in the evening   cetirizine 10 MG tablet Commonly known as:  ZYRTEC Take 10 mg by mouth daily.   diazepam 2 MG tablet Commonly known as:  VALIUM Take 1 tablet every 6 - 8 hours as needed for anxiety or seizures   Flaxseed Oil 1000 MG Caps Take 1,000 mg by mouth daily.   guaiFENesin 600  MG 12 hr tablet Commonly known as:  MUCINEX Take 600 mg by mouth 2 (two) times daily.   hydrocortisone 25 MG suppository Commonly known as:  ANUSOL-HC Place 1 suppository (25 mg total) rectally at bedtime.   levETIRAcetam 500 MG tablet Commonly known as:  KEPPRA Take 2 1/2 tablets by mouth twice per day   levothyroxine 25 MCG tablet Commonly known as:  SYNTHROID, LEVOTHROID Take 25 mcg by mouth daily before breakfast.   LORazepam 2 MG/ML concentrated solution Commonly known as:  ATIVAN Withdraw 69ml (2mg ) and 71ml  Normal saline into 3 ml syringe. Administer IV push over 3-5 minutes as needed for recurrent seizures   montelukast 10  MG tablet Commonly known as:  SINGULAIR Take 10 mg by mouth at bedtime.   multivitamin with minerals Tabs tablet Take 1 tablet by mouth daily.   OMEGA 3 PO Take 520 mg by mouth daily.   ondansetron 4 MG disintegrating tablet Commonly known as:  ZOFRAN-ODT Take 1 tablet as needed for nausea and vomiting   [name] 10 MG tablet Generic drug:  cloBAZam TAKE 1 TABLET BY MOUTH IN THE MORNING, 1 TAB IN THE AFTERNOON AND 2 TABLETS AT BEDTIME   PHENObarbital 65 MG/ML injection Commonly known as:  LUMINAL Withdraw 69ml (65mg ) Phenobarbital and 60ml Normal Saline into 32ml syringe. Adminster IV push for 3-5 minutes as needed for recurrent seizures   potassium chloride SA 20 MEQ tablet Commonly known as:  K-DUR,KLOR-CON Take 1 tablet (20 mEq total) by mouth daily.   potassium phosphate (monobasic) 500 MG tablet Commonly known as:  K-PHOS ORIGINAL TAKE 1 TABLET (500mg ) BY MOUTH FOUR TIMES A DAY   promethazine 25 MG suppository Commonly known as:  PHENERGAN Place 1 suppository (25 mg total) rectally every 6 (six) hours as needed for nausea or vomiting.   rosuvastatin 10 MG tablet Commonly known as:  CRESTOR Take 10 mg by mouth at bedtime.   Topiramate ER 200 MG Cp24 Commonly known as:  TROKENDI XR Take 1 capsule by mouth at bedtime   Topiramate ER  50 MG Cp24 Commonly known as:  TROKENDI XR Take 2 capsules by mouth at bedtime.   VIMPAT 100 MG Tabs Generic drug:  Lacosamide Take 100 mg by mouth 2 (two) times daily.   VIMPAT 50 MG Tabs tablet Generic drug:  lacosamide TAKE 2 TABLETS BY MOUTH IN THE MORNING AND 2 TABLETS AT NIGHT       The medication list was reviewed and reconciled. All changes or newly prescribed medications were explained.  A complete medication list was provided to the patient/caregiver.  Jodi Geralds MD

## 2018-05-26 NOTE — Progress Notes (Addendum)
Patient: [name] MRN: [id number] Sex: male DOB: [birth year]  Provider: Wyline Copas, MD Location of Care: [facility] Child Neurology  Note type: Routine return visit  History of Present Illness: Referral Source: Anastasia Pall, MD History from: mother, patient and [facility] chart Chief Complaint: follow up of epilepsy  [name] is a 32 y.o. 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 [name] was last seen March 03, 2018. He has vagal nerve stimulator for his seizures and is followed by Dr Gaynell Face and Dr Lisabeth Devoid at [facility]. He is taking and tolerating Carbamazepine, Keppra, Vimpat, [name] and Trokendi XR for seizures. His parents give him Phenobarbital and Lorazepam through the portacath when he has a severe seizure. He also requires blow by oxygen when he has a seizure and sometimes rescue breathing due to apnea with a seizure.  He has a medical alert dog named Apollo. Apollo missed seizures while in [place]. Mom believes Apollo didn't know how to alert her while in the vehicle. [name] had two seizures recently shortly after coming back inside from being outside. Mom believes Apollo unable to pick up scent that a seizure was coming.  August 3, 23, 30, September 3 were last seizures. Each lasted 5-6 minutes. No episodes of associated apnea. All were generalized tonic-clonic seizures. No sensory seizures. Once in a while will have partial seizures that end within 3 minutes.   Mom notes that his balance has been off with staggering, and he has fallen a few times. Mom thinks it's secondary to medications, because it's worse after he takes his  medications. No other pattern to timing throughout the day.   Mom started separating medications in morning because [name] would complain of migraines when given all at one time. Before breakfast takes Synthroid, KPhos, [name], and Carbamazepine. After breakfast takes Keppra and Vimpat.  Saw Dr. Jeoffrey Massed in early August. Only change made was to increase Keppra by 250 mg. Now taking Keppra 1250mg  BID.   Procedure: Interrogation and reprogramming of Vagal Nerve Stimulator  Implantation of the current stimulator 104 serial # [id number] implanted 04/19/14   Interrogation of the vagal nerve stimulator: Voltage 2.25 mA. Frequency 20 Hz, pulse width 250 microseconds, signal time on 30 seconds, signal time off 1.1 minutes; magnet current 2.50 mA, magnet signal time on 60 seconds, magnet pulse width 250 microseconds. I reprogrammed the computer for diagnostics.  The normal mode diagnostics using his current settings revealed the battery is 75% full, and communication, output, and impedance are good; 1729ohms. 1888stimuli since implantation, 29since February 22, 2018. There is no indication change the battery.  He tolerated the procedure well.  Review of Systems: Please see the HPI for neurologic and other pertinent review or systems. Otherwise, all other symptoms were reviewed and were negative.   Past Medical History Diagnosis Date  . ALLERGIC RHINITIS 12/26/2009  . Blood clot in vein   . Brown's syndrome   . Growth disorder   . Headache   . Heart murmur    moderate MR  . Hyperlipidemia    No therapy  . Hypokalemia   . Mental retardation, mild (I.Q. 50-70)   . MITRAL VALVE PROLAPSE 06/22/2007  . SEIZURE DISORDER 06/22/2007  . Sleep apnea  cpap , last sleep study 10/01/11  . Unspecified hypothyroidism 06/22/2007   Hospitalizations: No., Head Injury: No., Nervous System Infections: No., Immunizations up to date: Yes.    Diagnosis of developmental delay as a toddler.   EEG 11/07/86 was  normal for gestational age.   Genetics evaluation short stature, prenatal onset, chromosomal study: 91 XY, fragile X syndrome negative, evaluation for MELAS and MERRF were negative.  Initial seizure 08/28/94 left locomotor with secondary generalization.   MRI of the brain 09/13/93: Scattered subcortical white matter lesions at the parietotemporal parieto-occipital junction is ischemic versus hamartomas.   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   Diagnosis attention deficit disorder inattentive type treated without success with Cylert April 1995  MRI brain 04/04/94 stable differential diagnosis hamartoma, vasculitis, ischemic, or embolic phenomenon.   Status epilepticus 01/31/95, and 04/15/95 Neurontin added to Tegretol   MRI brain 06/14/96 unchanged   Admission to [facility]. Tegretol 30 mcg for militate. 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   Topiramate started in May 1999 unable to be tolerated with Tegretol and was tapered and discontinued.  January 2000 EEG showed right greater than left mid temporal sharp waves was otherwise well-organized EEG. He hospitalizations in March, July, October, in November for recurrent seizures.  Patient placed on Depakote in April 2000 and developed gagging abdominal pain headache and had significant change in transaminases and liver functions. Topamax was restarted and Keppra was added.  Vimpat was started July 16, 2012 and adjusted upwards.   [name] was added on November 18, 2012 and has been adjusted  upwards. Topamax was changed to Trokendi XR in attempt to deal with sleepiness, unsteadiness caused by polypharmacy. We to these medications have been introduced, the patient experiences somewhat improved seizure control however Is quite likely that despite better seizure control, the patient is experiencing impairment in cognition and gait as a result of polypharmacy .  MRI of the brain on March 21, 2013. The patient has extensive calcification to the basal ganglia bilaterally, normal ventricle size, and no acute findings. EKG was unchanged  Birth History He was a breech presentation, cesarean section delivery. He had intrauterine growth retardation, and failure to thrive.  Behavior History Mild intellectual disability  Surgical History Procedure Laterality Date  . ANKLE SURGERY Left   . EYE SURGERY     X2 for Brown Syndrome  . IMPLANTATION VAGAL NERVE STIMULATOR     Left  . PORTACATH PLACEMENT     and removal 04/08/12   Family History family history includes Breast cancer in his mother; Clotting disorder in his mother; Colon polyps in his father and mother; Congestive Heart Failure in his maternal grandfather; Coronary artery disease in his maternal grandfather and paternal grandfather; Diabetes in his father; Hyperlipidemia in his father; Hypertension in his father; Lung cancer in his paternal grandfather; Pneumonia in his maternal grandfather and paternal grandmother; Stroke in his paternal grandmother. Family history is negative for migraines, seizures, intellectual disabilities, blindness, deafness, birth defects, chromosomal disorder, or autism.  Social History Socioeconomic History  . Marital status: Single  . Years of education:  13 years  .  Highest education level: Not on file  Occupational History  . Occupation: Disabled    Fish farm manager: UNEMPLOYED  Social Needs  . Financial resource strain: Not on file  . Food insecurity:    Worry: Not on file    Inability: Not on file  .  Transportation needs:    Medical: Not on file    Non-medical: Not on file  Tobacco Use  . Smoking status: Never Smoker  . Smokeless tobacco: Never Used  Substance and Sexual Activity  . Alcohol use: No    Alcohol/week: 0.0 standard drinks  . Drug use: No  . 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     He lives with his parents.     He enjoys helping around the house, Nascar, and football.   Allergies Allergen Reactions  . Antihistamines, Chlorpheniramine-Type Other (See Comments)    Other Reaction: Dimetapp causes seizures  . Divalproex Sodium Other (See Comments)    Nausea, vomiting, liver and kidney dysfunction  . Phenytoin Rash and Other (See Comments)    Dilantin causes more seizures.   . Valproic Acid And Related Other (See Comments)    Shuts down systems  . Vancomycin Rash, Other (See Comments) and Swelling    If given too fast, causes red man reaction Other Reaction: Redman's Syndrome   Physical Exam BP 110/70   Pulse 68   Ht 5' 3.25" (1.607 m)   Wt 131 lb (59.4 kg)   BMI 23.02 kg/m   General: alert, well nourished male, in no acute distress, brown hair,  hazel eyes, right handed.  Head: microcephalic, has beak-like nose, low lying palate. Head is turned slightly to the right, swollen face left greater than right Ears, Nose and Throat: Otoscopic: tympanic membranes occluded; pharynx: oropharynx is pink without exudates or tonsillar hypertrophy Neck: supple Musculoskeletal: Has kyphosis and bilateral tight heel cords. Toes face laterally bilaterally.  Skin: no rashes or neurocutaneous lesions  Neurologic Exam  Mental Status: awake and alert; knowledge is below normal for age; language is below normal for age, speech with mild dysarthria. Able to follow commands and participate in exam.  Cranial Nerves: visual fields are full to confrontation;  extraocular movements are full and dysconjugate; pupils are round and reactive to light; funduscopic examination shows sharp disc margins with normal vessels; symmetric facial strength, bilateral eyelid ptosis; midline tongue and uvula Motor: Normal functional strength, tone and mass; clumsy fine motor movements; no pronator drift Sensory: intact to touch in upper extremities bilaterally Coordination: good finger-to-nose and repetitive alternating movements. Finger opposition clumsy. Gait and Station: Broad based antalgic gait Reflexes: symmetric and diminished bilaterally; no clonus; bilateral flexor plantar responses  Assessment 1. Generalized convulsive epilepsy with intractable epilepsy G40.319 2. Partial epilepsy with intractable epilepsy G40.119 3. Migraine without aura and without status migrainosus, not intractable G43.009 4. 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 He is taking and tolerating Carbamazepine, Keppra, Vimpat, [name], and Topiramate XR for his epilepsy.   The patient's previous [facility] records  were reviewed. Since his last clinic visit in June 2019, he was seen by Dr. Lisabeth Devoid at [facility] who increased his Keppra to 1250 mg BID. He was seen by Dr. Silverio Decamp at [facility] for continual management of his constipation. He has not required any imaging or lab studies since his last visit.   Plan The medication list was reviewed and reconciled. No changes were made in the prescribed medications today. A complete medication list was provided to his mother.  [name] will follow up in 3 months for follow up or sooner if needed. Mom agrees with the plan today.   Medication List    Accurate as of 05/26/18  2:00 PM.      acetaminophen 500 MG tablet Commonly known as:  TYLENOL Take 1,000 mg by mouth every 6 (six) hours as needed for headache.   carbamazepine 100 MG chewable tablet Commonly known as:  TEGRETOL Chew 2 tablets in the morning, 2 tablets at midday and 1+1/2 tablets in the evening   cetirizine 10 MG tablet Commonly known as:  ZYRTEC Take 10 mg by mouth daily.   diazepam 2 MG tablet Commonly known as:  VALIUM Take 1 tablet every 6 - 8 hours as needed for anxiety or seizures   Flaxseed Oil 1000 MG Caps Take 1,000 mg by mouth daily.   guaiFENesin 600 MG 12 hr tablet Commonly known as:  MUCINEX Take 600 mg by mouth 2 (two) times daily.   hydrocortisone 25 MG suppository Commonly known as:  ANUSOL-HC Place 1 suppository (25 mg total) rectally at bedtime.   levETIRAcetam 500 MG tablet Commonly known as:  KEPPRA Take 2 1/2 tablets by mouth twice per day   levothyroxine 25 MCG tablet Commonly known as:  SYNTHROID, LEVOTHROID Take 25 mcg by mouth daily before breakfast.   LORazepam 2 MG/ML concentrated solution Commonly known as:  ATIVAN Withdraw 70ml (2mg ) and 46ml  Normal saline into 3 ml syringe. Administer IV push over 3-5 minutes as needed for recurrent seizures   montelukast 10 MG tablet Commonly known as:  SINGULAIR Take 10 mg by mouth at bedtime.   multivitamin with minerals Tabs tablet Take 1 tablet by mouth daily.   OMEGA 3 PO Take 520 mg by mouth daily.   ondansetron 4 MG disintegrating tablet Commonly known as:  ZOFRAN-ODT Take 1 tablet as needed for nausea and vomiting   [name] 10 MG tablet Generic drug:  cloBAZam TAKE 1 TABLET BY MOUTH IN THE MORNING, 1 TAB IN THE AFTERNOON AND 2 TABLETS AT BEDTIME   PHENObarbital 65 MG/ML injection Commonly known as:  LUMINAL Withdraw 53ml (65mg ) Phenobarbital and 49ml  Normal Saline into 51ml syringe. Adminster IV push for 3-5 minutes as needed for recurrent seizures   potassium chloride SA 20 MEQ tablet Commonly known as:  K-DUR,KLOR-CON Take 1 tablet (20 mEq total) by mouth daily.   potassium phosphate (monobasic) 500 MG tablet Commonly known as:  K-PHOS ORIGINAL TAKE 1 TABLET (500mg ) BY MOUTH FOUR TIMES A DAY   promethazine 25 MG suppository Commonly known as:  PHENERGAN Place 1 suppository (25 mg total) rectally every 6 (six) hours as needed for nausea or vomiting.   rosuvastatin 10 MG tablet Commonly known as:  CRESTOR Take 10 mg by mouth at bedtime.   Topiramate ER 200 MG Cp24 Commonly known as:  TROKENDI XR Take 1 capsule by mouth at bedtime   Topiramate ER 50 MG Cp24 Commonly known as:  TROKENDI XR Take 2 capsules by mouth at  bedtime.   VIMPAT 100 MG Tabs Generic drug:  Lacosamide Take 100 mg by mouth 2 (two) times daily.   VIMPAT 50 MG Tabs tablet Generic drug:  lacosamide TAKE 2 TABLETS BY MOUTH IN THE MORNING AND 2 TABLETS AT NIGHT    The medication list was reviewed and reconciled. All changes or newly prescribed medications were explained.  A complete medication list was provided to the patient/caregiver.  Bethel Born, MD [facility] PGY1  50% of a 25-minute visit was spent consultation and coordination of care.  I supervised Dr. Marijean Bravo.  I agree with her observations and recommendations except where I made additions.  I performed physical examination, participated in history taking, and guided decision making.  Jodi Geralds MD  I did not change the resident Pryor Curia to my name before signing.

## 2018-08-08 ENCOUNTER — Other Ambulatory Visit (INDEPENDENT_AMBULATORY_CARE_PROVIDER_SITE_OTHER): Payer: Self-pay | Admitting: Pediatrics

## 2018-08-08 DIAGNOSIS — E876 Hypokalemia: Secondary | ICD-10-CM

## 2018-08-25 ENCOUNTER — Ambulatory Visit (INDEPENDENT_AMBULATORY_CARE_PROVIDER_SITE_OTHER): Payer: Medicaid Other | Admitting: Pediatrics

## 2018-08-25 ENCOUNTER — Encounter (INDEPENDENT_AMBULATORY_CARE_PROVIDER_SITE_OTHER): Payer: Self-pay | Admitting: Pediatrics

## 2018-08-25 VITALS — BP 108/60 | HR 80 | Ht 63.5 in | Wt 130.0 lb

## 2018-08-25 DIAGNOSIS — E876 Hypokalemia: Secondary | ICD-10-CM

## 2018-08-25 DIAGNOSIS — G40309 Generalized idiopathic epilepsy and epileptic syndromes, not intractable, without status epilepticus: Secondary | ICD-10-CM | POA: Diagnosis not present

## 2018-08-25 DIAGNOSIS — G40119 Localization-related (focal) (partial) symptomatic epilepsy and epileptic syndromes with simple partial seizures, intractable, without status epilepticus: Secondary | ICD-10-CM | POA: Diagnosis not present

## 2018-08-25 DIAGNOSIS — G43009 Migraine without aura, not intractable, without status migrainosus: Secondary | ICD-10-CM

## 2018-08-25 DIAGNOSIS — G40319 Generalized idiopathic epilepsy and epileptic syndromes, intractable, without status epilepticus: Secondary | ICD-10-CM

## 2018-08-25 MED ORDER — TROKENDI XR 50 MG PO CP24: 2.0000 | ORAL_CAPSULE | Freq: Every day | ORAL | 4 refills | Status: DC

## 2018-08-25 MED ORDER — POTASSIUM PHOSPHATE MONOBASIC 500 MG PO TABS: ORAL_TABLET | ORAL | 4 refills | Status: DC

## 2018-08-25 MED ORDER — TROKENDI XR 200 MG PO CP24: ORAL_CAPSULE | ORAL | 5 refills | Status: DC

## 2018-08-25 MED ORDER — CARBAMAZEPINE 100 MG PO CHEW: CHEWABLE_TABLET | ORAL | 5 refills | Status: DC

## 2018-08-25 MED ORDER — LEVETIRACETAM 500 MG PO TABS: ORAL_TABLET | ORAL | 5 refills | Status: DC

## 2018-08-25 NOTE — Patient Instructions (Signed)
I am glad that [name] is doing well.  We will not going make any significant changes.  We will see you in 3 months.  Happy birthday [name]!

## 2018-08-25 NOTE — Progress Notes (Signed)
Patient: [name] MRN: [id number] Sex: male DOB: 12-27-1985  Provider: Wyline Copas, MD Location of Care: [facility]  Note type: Routine return visit  History of Present Illness: Referral Source: Anastasia Pall, MD History from: mother, patient and [facility] chart Chief Complaint: Epilepsy  [name] is a 32 y.o. male who returns on August 25, 2018 for the first time since May 26, 2018.  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    He takes 5 antiepileptic medications for his seizures and migraines and also has a vagal nerve stimulator.  He receives phenobarbital and lorazepam through his [name] and he has prolonged seizures.  He has a Optician, dispensing, named Apollo,  who has very quickly learned to sense when the patient is about to have a seizure.    He had two 3 to 4 minute convulsive seizures on November 18th and 27th.  His mother says that he has 1 to 2 seizures every 2 weeks.  Almost all of his seizures now are convulsive.  He has no focal epilepsy with or without impairment of consciousness.  As best I know, he has had no recent seizures associated with apnea or bradycardia.    His mother says that some of his seizures have responded to magnet activating his vagal nerve stimulator.  She calls those "VNS seizures."  In addition to the above problems, he also has hypokalemia, which responds only to large doses of potassium phosphate and potassium chloride.  His family has cut his levetiracetam slightly which fortunately has not markedly increased his seizures.  In addition to  assessing him today and refilling prescriptions, I interrogated and reprogrammed his vagal nerve stimulator and this is noted below.  Procedure: Interrogation and reprogramming of Vagal Nerve Stimulator  Implantation of the current stimulator 104 serial # [id number] implanted 04/19/14   Interrogation of the vagal nerve stimulator: Voltage 2.25 mA. Frequency 20 Hz, pulse width 250 microseconds, signal time on30seconds, signal time off 1.1 minutes; magnet current 2.50 mA, magnet signal time on 60 seconds, magnet pulse width 250 microseconds. I reprogrammed the computer for diagnostics.  The normal mode diagnostics using his current settings revealed the battery is 75% full, and communication, output, and impedance are good; 1714ohms. There is no indication change the battery.  Review of Systems: A complete review of systems was remarkable for mom reports that patient has one to two seizures every two weeks. She states that he has had two seizures this month, all other systems reviewed and negative.  Past Medical History Diagnosis Date  . ALLERGIC RHINITIS 12/26/2009  . Blood clot in vein   . Brown's syndrome   . Growth disorder   . Headache   . Heart murmur    moderate MR  . Hyperlipidemia    No therapy  . Hypokalemia   . Mental retardation, mild (I.Q. 50-70)   . MITRAL VALVE PROLAPSE 06/22/2007  . SEIZURE DISORDER 06/22/2007  . Sleep apnea    cpap , last sleep study 10/01/11  . Unspecified hypothyroidism 06/22/2007   Hospitalizations: No., Head Injury: No., Nervous System Infections: No., Immunizations up to date: Yes.    Diagnosis of developmental  delay as a toddler.   EEG 11/07/86 was normal for gestational age.   Genetics evaluation short stature, prenatal onset, chromosomal study: 46 XY, fragile X syndrome negative, evaluation for MELAS and MERRF were negative.  Initial seizure 08/28/94 left locomotor with secondary generalization.   MRI of the brain 09/13/93: Scattered  subcortical white matter lesions at the parietotemporal parieto-occipital junction is ischemic versus hamartomas.   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   Diagnosis attention deficit disorder inattentive type treated without success with Cylert April 1995  MRI brain 04/04/94 stable differential diagnosis hamartoma, vasculitis, ischemic, or embolic phenomenon.   Status epilepticus 01/31/95, and 04/15/95 Neurontin added to Tegretol   MRI brain 06/14/96 unchanged   Ad[facility] to [facility]. Tegretol 30 mcg for militate. 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   Topiramate started in May 1999 unable to be tolerated with Tegretol and was tapered and discontinued.  January 2000 EEG showed right greater than left mid temporal sharp waves was otherwise well-organized EEG. He hospitalizations in March, July, October, in November for recurrent seizures.  Patient placed on Depakote in April 2000 and developed gagging abdominal pain headache and had significant change in transaminases and liver functions. Topamax was restarted and Keppra was added.  Vimpat was started July 16, 2012 and adjusted upwards.   [name] was added on November 18, 2012 and has been adjusted upwards. Topamax was changed to Trokendi XR in attempt to deal with sleepiness, unsteadiness caused by polypharmacy. We to these medications have been introduced, the patient experiences somewhat improved seizure control however Is quite likely that despite better seizure control, the patient is  experiencing impairment in cognition and gait as a result of polypharmacy .  MRI of the brain on March 21, 2013. The patient has extensive calcification to the basal ganglia bilaterally, normal ventricle size, and no acute findings. EKG was unchanged  Birth History He was a breech presentation, cesarean section delivery. He had intrauterine growth retardation, and failure to thrive.  Behavior History Mild intellectual disability  Surgical History Procedure Laterality Date  . ANKLE SURGERY Left   . EYE SURGERY     X2 for Brown Syndrome  . IMPLANTATION VAGAL NERVE STIMULATOR     Left  . PORTACATH PLACEMENT     and removal 04/08/12   Family History family history includes Breast cancer in his mother; Clotting disorder in his mother; Colon polyps in his father and mother; Congestive Heart Failure in his maternal grandfather; Coronary artery disease in his maternal grandfather and paternal grandfather; Diabetes in his father; Hyperlipidemia in his father; Hypertension in his father; Lung cancer in his paternal grandfather; Pneumonia in his maternal grandfather and paternal grandmother; Stroke in his paternal grandmother. Family history is negative for migraines, seizures, intellectual disabilities, blindness, deafness, birth defects, chromosomal disorder, or autism.  Social History Social History   Socioeconomic History  . Marital status: Single  . Years of education:  14  . Highest education level:  High school certificate  Occupational History  . Occupation: Disabled    Fish farm manager: UNEMPLOYED  Social Needs  . Financial resource strain: Not  on file  . Food insecurity:    Worry: Not on file    Inability: Not on file  . Transportation needs:    Medical: Not on file    Non-medical: Not on file  Tobacco Use  . Smoking status: Never Smoker  . Smokeless tobacco: Never Used  Substance and Sexual Activity  . Alcohol use: No    Alcohol/week: 0.0 standard drinks  . Drug use: No  .  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     He lives with his parents.     He enjoys helping around the house, Nascar, and football.   Allergies Allergen Reactions  . Antihistamines, Chlorpheniramine-Type Other (See Comments)    Other Reaction: Dimetapp causes seizures  . Divalproex Sodium Other (See Comments)    Nausea, vomiting, liver and kidney dysfunction  . Phenytoin Rash and Other (See Comments)    Dilantin causes more seizures.   . Valproic Acid And Related Other (See Comments)    Shuts down systems  . Vancomycin Rash, Other (See Comments) and Swelling    If given too fast, causes red man reaction Other Reaction: Redman's Syndrome   Physical Exam BP 108/60   Pulse 80   Ht 5' 3.5" (1.613 m)   Wt 130 lb (59 kg)   BMI 22.67 kg/m   General: alert, well developed, well nourished, in no acute distress, brown hair, hazel eyes, right handed Head: normocephalic, no dysmorphic features Ears, Nose and Throat: Otoscopic: tympanic membranes normal; pharynx: oropharynx is pink without exudates or tonsillar hypertrophy Neck: supple, full range of motion, no cranial or cervical bruits Respiratory: auscultation clear Cardiovascular: no murmurs, pulses are normal Musculoskeletal: no skeletal deformities or apparent scoliosis Skin: no rashes or neurocutaneous lesions  Neurologic Exam  Mental Status: alert; oriented to person, place and year; knowledge is normal for age; language is normal Cranial Nerves: visual fields are full to double simultaneous stimuli; extraocular movements are full and conjugate; pupils are round reactive to light; funduscopic examination shows sharp disc margins with normal vessels; symmetric facial strength; midline tongue and uvula; air conduction is greater than bone conduction bilaterally Motor: Normal strength, tone and mass; good fine  motor movements; no pronator drift Sensory: intact responses to cold, vibration, proprioception and stereognosis Coordination: good finger-to-nose, rapid repetitive alternating movements and finger apposition Gait and Station: normal gait and station: patient is able to walk on heels, toes and tandem without difficulty; balance is adequate; Romberg exam is negative; Gower response is negative Reflexes: symmetric and diminished bilaterally; no clonus; bilateral flexor plantar responses  Assessment 1. 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 2. 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 3. Hypokalemia, E87.6. 4. Migraine without aura without status migrainosus, not intractable, G43.009.  Discussion The patient is stable at this time.  We are not going to bring his seizures under complete control, but the last time he was hospitalized was on Feb 13, 2018.  Before that, it was on June 12, 2016 a hospitalization that was not related to seizures.  Plan I refilled prescriptions today for Trokendi (both capsules), levetiracetam, and carbamazepine as well as potassium phosphate.  I also interrogated his vagal nerve stimulator and it is working well.  He will return to see me in 3 months' time.  I will see him sooner based on clinical need.  Greater  than 50% of the 25 minute visit was spent in counseling and coordination of care concerning his seizures and his hypokalemia.   Medication List    Accurate as of 08/25/18 11:30 AM.      acetaminophen 500 MG tablet Commonly known as:  TYLENOL Take 1,000 mg by mouth every 6 (six) hours as needed for headache.   carbamazepine 100 MG chewable tablet Commonly known as:  TEGRETOL Chew 2 tablets in the morning, 2 tablets at midday and 1+1/2 tablets in the evening   cetirizine 10 MG tablet Commonly known as:  ZYRTEC Take 10 mg by mouth daily.   diazepam 2 MG tablet Commonly known as:  VALIUM Take 1 tablet every 6  - 8 hours as needed for anxiety or seizures   Flaxseed Oil 1000 MG Caps Take 1,000 mg by mouth daily.   guaiFENesin 600 MG 12 hr tablet Commonly known as:  MUCINEX Take 600 mg by mouth 2 (two) times daily.   hydrocortisone 25 MG suppository Commonly known as:  ANUSOL-HC Place 1 suppository (25 mg total) rectally at bedtime.   levETIRAcetam 500 MG tablet Commonly known as:  KEPPRA Take 2 1/2 tablets by mouth twice per day   levothyroxine 25 MCG tablet Commonly known as:  SYNTHROID, LEVOTHROID Take 25 mcg by mouth daily before breakfast.   LORazepam 2 MG/ML concentrated solution Commonly known as:  ATIVAN Withdraw 65ml (2mg ) and 82ml  Normal saline into 3 ml syringe. Administer IV push over 3-5 minutes as needed for recurrent seizures   montelukast 10 MG tablet Commonly known as:  SINGULAIR Take 10 mg by mouth at bedtime.   multivitamin with minerals Tabs tablet Take 1 tablet by mouth daily.   OMEGA 3 PO Take 520 mg by mouth daily.   ondansetron 4 MG disintegrating tablet Commonly known as:  ZOFRAN-ODT Take 1 tablet as needed for nausea and vomiting   [name] 10 MG tablet Generic drug:  cloBAZam TAKE 1 TABLET BY MOUTH IN THE MORNING, 1 TAB IN THE AFTERNOON AND 2 TABLETS AT BEDTIME   PHENObarbital 65 MG/ML injection Commonly known as:  LUMINAL Withdraw 38ml (65mg ) Phenobarbital and 20ml Normal Saline into 37ml syringe. Adminster IV push for 3-5 minutes as needed for recurrent seizures   potassium chloride SA 20 MEQ tablet Commonly known as:  K-DUR,KLOR-CON Take 1 tablet (20 mEq total) by mouth daily.   potassium phosphate (monobasic) 500 MG tablet Commonly known as:  K-PHOS ORIGINAL TAKE 1 TABLET (500mg ) BY MOUTH FOUR TIMES A DAY   potassium phosphate (monobasic) 500 MG tablet Commonly known as:  K-PHOS ORIGINAL TAKE 1 TABLET (500MG ) BY MOUTH FOUR TIMES A DAY   promethazine 25 MG suppository Commonly known as:  PHENERGAN Place 1 suppository (25 mg total) rectally  every 6 (six) hours as needed for nausea or vomiting.   rosuvastatin 10 MG tablet Commonly known as:  CRESTOR Take 10 mg by mouth at bedtime.   Topiramate ER 200 MG Cp24 Commonly known as:  TROKENDI XR Take 1 capsule by mouth at bedtime   Topiramate ER 50 MG Cp24 Commonly known as:  TROKENDI XR Take 2 capsules by mouth at bedtime.   VIMPAT 100 MG Tabs Generic drug:  Lacosamide Take 100 mg by mouth 2 (two) times daily.   VIMPAT 50 MG Tabs tablet Generic drug:  lacosamide TAKE 2 TABLETS BY MOUTH IN THE MORNING AND 2 TABLETS AT NIGHT    The medication list was reviewed and reconciled. All changes or newly  prescribed medications were explained.  A complete medication list was provided to the patient/caregiver.  Jodi Geralds MD

## 2018-09-03 ENCOUNTER — Telehealth (INDEPENDENT_AMBULATORY_CARE_PROVIDER_SITE_OTHER): Payer: Self-pay | Admitting: Family

## 2018-09-03 NOTE — Telephone Encounter (Signed)
I would not expect this to be the case if the dose has not previously caused these symptoms, but I will defer to his primary neurologist who will be back on Monday.    Carylon Perches MD MPH

## 2018-09-03 NOTE — Telephone Encounter (Signed)
°  Who's calling (name and relationship to patient) : Doris Harl(mom)  Best contact number: [phone]  Provider they see: Willadean Carol  Reason for call: Tamela Oddi called to inquire if the generic Kepra (Levetiracetam) could be the cause for [name] to be wired and hyper, they have notice a change in him, that does not seem to be changing. When they had his medicine filled in October it was the generic brand and he usually takes name brand. Please call back to advise.

## 2018-09-06 ENCOUNTER — Other Ambulatory Visit: Payer: Self-pay

## 2018-09-06 ENCOUNTER — Emergency Department (HOSPITAL_COMMUNITY): Payer: Medicaid Other

## 2018-09-06 ENCOUNTER — Emergency Department (HOSPITAL_COMMUNITY)
Admission: EM | Admit: 2018-09-06 | Discharge: 2018-09-06 | Disposition: A | Discharge status: Home / Self Care | Payer: Medicaid Other | Attending: Emergency Medicine | Admitting: Emergency Medicine

## 2018-09-06 ENCOUNTER — Encounter (HOSPITAL_COMMUNITY): Payer: Self-pay

## 2018-09-06 DIAGNOSIS — G40909 Epilepsy, unspecified, not intractable, without status epilepticus: Secondary | ICD-10-CM | POA: Insufficient documentation

## 2018-09-06 DIAGNOSIS — R569 Unspecified convulsions: Secondary | ICD-10-CM

## 2018-09-06 DIAGNOSIS — E039 Hypothyroidism, unspecified: Secondary | ICD-10-CM | POA: Insufficient documentation

## 2018-09-06 DIAGNOSIS — Z79899 Other long term (current) drug therapy: Secondary | ICD-10-CM | POA: Diagnosis not present

## 2018-09-06 LAB — COMPREHENSIVE METABOLIC PANEL
ALT: 21 U/L (ref 0–44)
ANION GAP: 7 (ref 5–15)
AST: 20 U/L (ref 15–41)
Albumin: 3.3 g/dL — ABNORMAL LOW (ref 3.5–5.0)
Alkaline Phosphatase: 77 U/L (ref 38–126)
BUN: 10 mg/dL (ref 6–20)
CO2: 22 mmol/L (ref 22–32)
Calcium: 8.5 mg/dL — ABNORMAL LOW (ref 8.9–10.3)
Chloride: 110 mmol/L (ref 98–111)
Creatinine, Ser: 0.93 mg/dL (ref 0.61–1.24)
GFR calc Af Amer: >60 mL/min (ref >60)
GFR calc non Af Amer: >60 mL/min (ref >60)
Glucose, Bld: 105 mg/dL — ABNORMAL HIGH (ref 70–99)
Potassium: 3.7 mmol/L (ref 3.5–5.1)
Sodium: 139 mmol/L (ref 135–145)
TOTAL PROTEIN: 6.4 g/dL — AB (ref 6.5–8.1)
Total Bilirubin: 0.3 mg/dL (ref 0.3–1.2)

## 2018-09-06 LAB — CBC WITH DIFFERENTIAL/PLATELET
Abs Immature Granulocytes: 0.02 10*3/uL (ref 0.00–0.07)
BASOS PCT: 0 %
Basophils Absolute: 0 10*3/uL (ref 0.0–0.1)
Eosinophils Absolute: 0.1 10*3/uL (ref 0.0–0.5)
Eosinophils Relative: 3 %
HCT: 40 % (ref 39.0–52.0)
Hemoglobin: 12.3 g/dL — ABNORMAL LOW (ref 13.0–17.0)
Immature Granulocytes: 0 %
Lymphocytes Relative: 28 %
Lymphs Abs: 1.3 10*3/uL (ref 0.7–4.0)
MCH: 26.9 pg (ref 26.0–34.0)
MCHC: 30.8 g/dL (ref 30.0–36.0)
MCV: 87.5 fL (ref 80.0–100.0)
Monocytes Absolute: 0.3 10*3/uL (ref 0.1–1.0)
Monocytes Relative: 7 %
Neutro Abs: 2.8 10*3/uL (ref 1.7–7.7)
Neutrophils Relative %: 62 %
Platelets: UNDETERMINED 10*3/uL (ref 150–400)
RBC: 4.57 MIL/uL (ref 4.22–5.81)
RDW: 14.6 % (ref 11.5–15.5)
WBC: 4.5 10*3/uL (ref 4.0–10.5)
nRBC: 0 % (ref 0.0–0.2)

## 2018-09-06 LAB — CARBAMAZEPINE LEVEL, TOTAL: Carbamazepine Lvl: 9 ug/mL (ref 4.0–12.0)

## 2018-09-06 LAB — CBG MONITORING, ED: Glucose-Capillary: 97 mg/dL (ref 70–99)

## 2018-09-06 MED ORDER — ACETAMINOPHEN 325 MG PO TABS
650.0000 mg | ORAL_TABLET | Freq: Once | ORAL | Status: AC
  Administered 2018-09-06: 650 mg via ORAL
  Filled 2018-09-06: qty 2

## 2018-09-06 MED ORDER — ONDANSETRON 4 MG PO TBDP
4.0000 mg | ORAL_TABLET | Freq: Once | ORAL | Status: AC
  Administered 2018-09-06: 4 mg via ORAL
  Filled 2018-09-06: qty 1

## 2018-09-06 MED ORDER — METOCLOPRAMIDE HCL 5 MG/ML IJ SOLN
10.0000 mg | Freq: Once | INTRAMUSCULAR | Status: AC
  Administered 2018-09-06: 10 mg via INTRAVENOUS
  Filled 2018-09-06: qty 2

## 2018-09-06 NOTE — Discharge Instructions (Addendum)
Call the patient's neurologist to make an appointment for follow-up later this week.  Please return to the emergency department for any new or worsening symptoms in the meantime.

## 2018-09-06 NOTE — ED Notes (Addendum)
PA Couture advised pt's mother that it is okay to give pt home seizure meds, mother administered 100 mg Vimpat and 1250 mg of Keppra in front of this RN.

## 2018-09-06 NOTE — Telephone Encounter (Signed)
Our records show that he has been on levetiracetam a long time but they may be incorrect.  I feel fairly certain that we had him on Keppra at one time but the electronic record does not show that.  At any rate I do not think that this is a recent change but I could be wrong.  Unfortunately he is now in the hospital because he had a prolonged seizure with apnea that went on for about 7 minutes.  Fortunately rescue breathing started after 3 minutes.  He is going to be hospitalized overnight for observation to make certain that this does not recur.  His levetiracetam was cut at that because of his unsteadiness.  I would not increase it back unless we start having more of this kind of seizure.  We do not want him to be unsteady on his feet and he did to be he did seem to be somewhat better on the lower dose.  I will be available to help the neuro hospitalist should he have additional difficulties.  I do not think levetiracetam is responsible for his behavior change.

## 2018-09-06 NOTE — ED Provider Notes (Signed)
[facility] EMERGENCY DEPARTMENT Provider Note   CSN: [id number] Arrival date & time: 09/06/18  1157     History   Chief Complaint Chief Complaint  Patient presents with  . Seizures    HPI [name] is a 32 y.o. male.  HPI   Pt is 32 year old male with a history of Brown syndrome, mitral valve prolapse, MR, seizure disorder, sleep apnea, who presents emergency department today with his mother for evaluation of a seizure that occurred prior to arrival.  She states that at 1115 he had what she says a grand mal seizure.  States his arms locked up toward his body.  He did not convulse but body was rigid.  he lost control of his bladder and bowels. States that his diaphragm "locks up "and he was not breathing on his own.  His father provided rescue breaths for about 3 minutes before the patient was able to breathe on his own.  Prior to the onset of his seizure he had been eating breakfast and they were concerned that he had food in his mouth and possibly aspirated this.  Patient does state that he has been coughing somewhat since the seizure occurred.  Mom at bedside states the patient is currently at his mental baseline, however he is complaining of a migraine. He usually takes tylenol for this. She states that he has had some of his missed seizure medications but has not had his Keppra 1250mg  or Vimpat 100g this morning.    Mother states patient has long history of seizures and is currently on multiple seizure medications.  She denies any missed doses.  States he follows with Dr. Gaynell Face.  Past Medical History:  Diagnosis Date  . ALLERGIC RHINITIS 12/26/2009  . Blood clot in vein   . Brown's syndrome   . Growth disorder   . Headache   . Heart murmur    moderate MR  . Hyperlipidemia    No therapy  . Hypokalemia   . Mental retardation, mild (I.Q. 50-70)   . MITRAL VALVE PROLAPSE 06/22/2007  . SEIZURE DISORDER 06/22/2007  . Sleep apnea    cpap , last  sleep study 10/01/11  . Unspecified hypothyroidism 06/22/2007    Patient Active Problem List   Diagnosis Date Noted  . Migraine without aura and without status migrainosus, not intractable 02/22/2018  . 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 Hypokalemia 08/03/2013  . Hypothyroidism 08/02/2013  . Subtherapeutic international normalized ratio (INR) 08/02/2013  . Contracture of ankle and foot joint 03/29/2013  . 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 Partial epilepsy with intractable epilepsy ([place]) 12/21/2012  . Acute venous embolism and thrombosis of subclavian veins ([place]) 12/21/2012  . Acute venous embolism and thrombosis of internal jugular veins ([place]) 12/21/2012  . 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 Mechanical complication of other vascular device, implant, and graft 12/21/2012  . 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 Long term (current) use of anticoagulants 05/21/2012  . Mitral regurgitation 04/29/2012  . A-fib ([place]) 04/08/2012  . Status epilepticus ([place]) 04/08/2012  . Irregular heart beat   . DVT of upper extremity (deep vein thrombosis) ([place]) 09/04/2011  . 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 Allergic rhinitis 12/26/2009  . MITRAL VALVE PROLAPSE 06/22/2007    Past Surgical History:  Procedure Laterality Date  . ANKLE SURGERY Left   . EYE SURGERY     X2 for Brown Syndrome  . IMPLANTATION VAGAL NERVE STIMULATOR  Left  . PORTACATH PLACEMENT     and removal 04/08/12        Home Medications    Prior to Admission medications   Medication Sig Start Date End Date Taking? Authorizing Provider  acetaminophen (TYLENOL) 500 MG tablet Take 1,000 mg by mouth every 6 (six) hours as needed for headache.   Yes [provider]  carbamazepine (TEGRETOL) 100 MG chewable tablet Chew 2 tablets in the morning, 2 tablets at midday and 1+1/2 tablets in the evening 08/25/18  Yes Hickling, Princess Bruins, MD  cetirizine (ZYRTEC) 10  MG tablet Take 10 mg by mouth daily. 02/12/15  Yes [provider]  Dextromethorphan-guaiFENesin 5-100 MG/5ML LIQD Take 10 mLs by mouth 2 (two) times daily as needed (cough).   Yes [provider]  diazepam (VALIUM) 2 MG tablet Take 1 tablet every 6 - 8 hours as needed for anxiety or seizures Patient taking differently: Take 2 mg by mouth every 6 (six) hours as needed for anxiety (seizures).  12/24/16  Yes Hickling, Princess Bruins, MD  Flaxseed, Linseed, (FLAXSEED OIL) 1000 MG CAPS Take 1,000 mg by mouth daily.   Yes [provider]  guaiFENesin (MUCINEX) 600 MG 12 hr tablet Take 600 mg by mouth 2 (two) times daily.   Yes [provider]  hydrocortisone (ANUSOL-HC) 25 MG suppository Place 1 suppository (25 mg total) rectally at bedtime. Patient taking differently: Place 25 mg rectally at bedtime as needed for hemorrhoids or anal itching.  01/11/18  Yes Nandigam, Venia Minks, MD  Lacosamide (VIMPAT) 100 MG TABS Take 100 mg by mouth 2 (two) times daily.   Yes [provider]  levETIRAcetam (KEPPRA) 500 MG tablet Take 2 1/2 tablets by mouth twice per day Patient taking differently: Take 1,000-1,250 mg by mouth See admin instructions. Take 2 1/2 tablets (1250mg ) in the AM and 2 tablets (1000mg ) in the evening 08/25/18  Yes Hickling, Princess Bruins, MD  levothyroxine (SYNTHROID, LEVOTHROID) 25 MCG tablet Take 25 mcg by mouth daily before breakfast.    Yes [provider]  LORazepam (ATIVAN) 2 MG/ML concentrated solution Withdraw 23ml (2mg ) and 67ml  Normal saline into 3 ml syringe. Administer IV push over 3-5 minutes as needed for recurrent seizures 04/07/18  Yes Carylon Perches, MD  montelukast (SINGULAIR) 10 MG tablet Take 10 mg by mouth at bedtime. 02/05/18  Yes [provider]  Multiple Vitamin (MULTIVITAMIN WITH MINERALS) TABS Take 1 tablet by mouth daily.   Yes [provider]  Omega-3 Fatty Acids (OMEGA 3 PO) Take 500 mg by mouth daily.    Yes  [provider]  ondansetron (ZOFRAN-ODT) 4 MG disintegrating tablet Take 1 tablet as needed for nausea and vomiting Patient taking differently: Take 4 mg by mouth every 8 (eight) hours as needed (nausea and vomiting related to migraines).  09/27/15  Yes Hickling, Princess Bruins, MD  [name] 10 MG tablet TAKE 1 TABLET BY MOUTH IN THE MORNING, 1 TAB IN THE AFTERNOON AND 2 TABLETS AT BEDTIME 04/14/18  Yes Hickling, Princess Bruins, MD  PHENObarbital (LUMINAL) 65 MG/ML injection Withdraw 54ml (65mg ) Phenobarbital and 73ml Normal Saline into 35ml syringe. [place] IV push for 3-5 minutes as needed for recurrent seizures 04/07/18  Yes Carylon Perches, MD  potassium chloride SA (K-DUR,KLOR-CON) 20 MEQ tablet Take 1 tablet (20 mEq total) by mouth daily. 03/03/18  Yes Goodpasture, Otila Kluver, NP  potassium phosphate, monobasic, (K-PHOS) 500 MG tablet TAKE 1 TABLET (500MG ) BY MOUTH FOUR TIMES A DAY 08/09/18  Yes Jodi Geralds, MD  promethazine (PHENERGAN) 25 MG suppository Place 1 suppository (25 mg total) rectally every 6 (six) hours as needed for nausea or vomiting. 08/18/17  Yes Little, Wenda Overland, MD  rosuvastatin (CRESTOR) 10 MG tablet Take 10 mg by mouth at bedtime. 05/26/16  Yes [provider]  TROKENDI XR 200 MG CP24 Take 1 capsule by mouth at bedtime Patient taking differently: Take 200 mg by mouth at bedtime. Take 1 capsule (200mg )  by mouth at bedtime 08/25/18  Yes Hickling, Princess Bruins, MD  TROKENDI XR 50 MG CP24 Take 2 capsules by mouth at bedtime. Patient taking differently: Take 50 mg by mouth at bedtime. Taking 1 Capsule (50mg ) at bedtime 08/25/18  Yes Hickling, Princess Bruins, MD  potassium phosphate, monobasic, (K-PHOS) 500 MG tablet TAKE 1 TABLET (500mg ) BY MOUTH FOUR TIMES A DAY Patient not taking: Reported on 09/06/2018 08/25/18   Jodi Geralds, MD  VIMPAT 50 MG TABS tablet TAKE 2 TABLETS BY MOUTH IN THE MORNING AND 2 TABLETS AT NIGHT Patient not taking: Reported on 09/06/2018 04/07/18    Carylon Perches, MD    Family History Family History  Problem Relation Age of Onset  . Breast cancer Mother   . Colon polyps Mother   . Clotting disorder Mother   . Diabetes Father   . Hypertension Father   . Colon polyps Father   . Hyperlipidemia Father   . Coronary artery disease Maternal Grandfather   . Congestive Heart Failure Maternal Grandfather        Died at 65  . Pneumonia Maternal Grandfather   . Coronary artery disease Paternal Grandfather   . Lung cancer Paternal Grandfather        Died at 6  . Pneumonia Paternal Grandmother        Died at 41  . Stroke Paternal Grandmother     Social History Social History   Tobacco Use  . Smoking status: Never Smoker  . Smokeless tobacco: Never Used  Substance Use Topics  . Alcohol use: No    Alcohol/week: 0.0 standard drinks  . Drug use: No     Allergies   Antihistamines, chlorpheniramine-type; Divalproex sodium; Phenytoin; Valproic acid and related; and Vancomycin   Review of Systems Review of Systems Constitutional: Negative for fever.  HENT: Negative for ear pain and sore throat.   Eyes: Negative for pain and visual disturbance.  Respiratory: Positive for cough and shortness of breath.   Cardiovascular: Negative for chest pain.  Gastrointestinal: Positive for nausea and vomiting. Negative for abdominal pain, constipation and diarrhea.  Genitourinary: Negative for dysuria and hematuria.  Musculoskeletal: Negative for back pain.  Skin: Negative for rash.  Neurological: Positive for seizures and headaches.  All other systems reviewed and are negative.  Physical Exam Updated Vital Signs BP 100/68   Pulse 80   Temp (!) 97.3 F (36.3 C) (Oral)   Resp 18   SpO2 100%   Physical Exam Physical Exam Vitals signs and nursing note reviewed.  Constitutional:      Appearance: He is well-developed.  HENT:     Head: Normocephalic and atraumatic.  Eyes:     Conjunctiva/sclera: Conjunctivae normal.  Neck:      Musculoskeletal: Neck supple.  Cardiovascular:     Rate and Rhythm: Normal rate and regular rhythm.     Heart sounds: No murmur.  Pulmonary:     Effort: Pulmonary effort is normal. No respiratory distress.     Breath sounds: Normal breath  sounds.  Abdominal:     Palpations: Abdomen is soft.     Tenderness: There is no abdominal tenderness.  Skin:    General: Skin is warm and dry.  Neurological:     Mental Status: He is alert.     Comments: Mental Status:  Alert, thought content appropriate, able to give a coherent history. Speech fluent without evidence of aphasia. Able to follow 2 step commands without difficulty.  Cranial Nerves:  II: pupils equal, round, reactive to light Perez,IV, VI: ptosis not present, extra-ocular motions intact bilaterally  V,VII: smile symmetric, facial light touch sensation equal VIII: hearing grossly normal to voice  X: uvula elevates symmetrically  XI: bilateral shoulder shrug symmetric and strong XII: midline tongue extension without fassiculations Motor:  Normal tone. 4/5 strength of BUE and BLE major muscle groups including strong and equal grip strength and dorsiflexion/plantar flexion Sensory: light touch normal in all extremities. Cerebellar: normal finger-to-nose with bilateral upper extremities   ED Treatments / Results  Labs (all labs ordered are listed, but only abnormal results are displayed) Labs Reviewed  CBC WITH DIFFERENTIAL/PLATELET - Abnormal; Notable for the following components:      Result Value   Hemoglobin 12.3 (*)    All other components within normal limits  COMPREHENSIVE METABOLIC PANEL - Abnormal; Notable for the following components:   Glucose, Bld 105 (*)    Calcium 8.5 (*)    Total Protein 6.4 (*)    Albumin 3.3 (*)    All other components within normal limits  CARBAMAZEPINE LEVEL, TOTAL  CBG MONITORING, ED    EKG EKG Interpretation  Date/Time:  Monday September 06 2018 12:10:50 EST Ventricular Rate:  90 PR  Interval:    QRS Duration: 117 QT Interval:  372 QTC Calculation: 456 R Axis:   62 Text Interpretation:  Sinus rhythm Nonspecific intraventricular conduction delay Confirmed by Veryl Speak [phone]) on 09/06/2018 1:30:59 PM   Radiology Dg Chest Portable 1 View  Result Date: 09/06/2018 CLINICAL DATA:  Cough EXAM: PORTABLE CHEST 1 VIEW COMPARISON:  02/17/2018 FINDINGS: [name] tip lower SVC unchanged. Left-sided vagal nerve stimulator unchanged. Heart size upper normal. Normal vascularity. Lungs are clear without infiltrate or effusion. IMPRESSION: No active disease. Electronically Signed   By: Franchot Gallo M.D.   On: 09/06/2018 13:36    Procedures Procedures (including critical care time)  Medications Ordered in ED Medications  ondansetron (ZOFRAN-ODT) disintegrating tablet 4 mg (4 mg Oral Given 09/06/18 1406)  acetaminophen (TYLENOL) tablet 650 mg (650 mg Oral Given 09/06/18 1406)  metoCLOPramide (REGLAN) injection 10 mg (10 mg Intravenous Given 09/06/18 1730)     Initial Impression / Assessment and Plan / ED Course  I have reviewed the triage vital signs and the nursing notes.  Pertinent labs & imaging results that were available during my care of the patient were reviewed by me and considered in my medical decision making (see chart for details).     Final Clinical Impressions(s) / ED Diagnoses   Final diagnoses:  Seizure ([place])   32 year old male with history of seizure disorder presenting the emergency department today for evaluation of a seizure which occurred prior to arrival.  Seizure was witnessed by parents.  Lasted approximately 3 to 5 minutes and resolved on its own.  Patient has had partial dose of his seizure medications this a.m.  He received remainder of his dosing in the ED.  Mom denies any missed doses recently.  Patient has been his normal active self recently.  She states he has back to baseline on arrival to the ED.  Neuro exam is nonfocal during my  evaluation.  He answers questions appropriately and is oriented.  He was monitored in the emergency room for several hours without additional seizure-like activity.  He did have some episodes of vomiting secondary to a migraine which is something he deals with chronically.  He received Zofran and Reglan in the ED and is no longer nauseated.  He has been able to tolerate p.o.  Labs are reassuring.  Carbamazepine level is normal.  X-ray without infiltrate.  Discussed that parents should watch for signs of aspiration pneumonia including coughing fevers, etc.  Advised him to return if he experiences symptoms consistent with this.  Will hold antibiotic therapy at this time.  EKG with normal sinus rhythm.  No ischemic changes or evidence of arrhythmia.  Will have patient follow-up with his neurologist in regards to his symptoms today.  Parents feel comfortable taking the patient home and agree to return if worse.  Patient discharged in stable condition.  ED Discharge Orders    None       Rodney Booze, [place] 09/06/18 Jarrett Soho, MD 09/07/18 [phone]

## 2018-09-06 NOTE — ED Notes (Signed)
Place seizure pads on railings

## 2018-09-06 NOTE — ED Notes (Signed)
Discharge instructions discussed with Pt and mother. Mother verbalized understanding. Pt stable and leaving via WC.

## 2018-09-06 NOTE — ED Triage Notes (Signed)
Pt from home for witnessed grand mal seizure this morning by dad. Pt has hx of same, last seizure the 9th of last month. Pt taking keppra daily. Per dad, ativan and phenobarbitol are the only meds that work to break his seizures. Possible aspiration with seizure this morning. Pt alert upon arrival to ED. CBG 145

## 2018-10-14 ENCOUNTER — Other Ambulatory Visit (INDEPENDENT_AMBULATORY_CARE_PROVIDER_SITE_OTHER): Payer: Self-pay | Admitting: Pediatrics

## 2018-10-14 DIAGNOSIS — G40401 Other generalized epilepsy and epileptic syndromes, not intractable, with status epilepticus: Secondary | ICD-10-CM

## 2018-10-14 DIAGNOSIS — G40319 Generalized idiopathic epilepsy and epileptic syndromes, intractable, without status epilepticus: Secondary | ICD-10-CM

## 2018-10-14 MED ORDER — LORAZEPAM 2 MG/ML PO CONC: ORAL | 5 refills | Status: DC

## 2018-10-14 MED ORDER — PHENOBARBITAL SODIUM 65 MG/ML IJ SOLN: INTRAMUSCULAR | 5 refills | Status: DC

## 2018-10-14 NOTE — Telephone Encounter (Signed)
°  Who's calling (name and relationship to patient) : [place] contact number: (fax) [phone] Provider they see: Gaynell Face Reason for call: Phenobarb and Ativan are needed for tomorrow.    PRESCRIPTION REFILL ONLY  Name of prescription: Ativan and Phenobarbital Pharmacy: [facility]

## 2018-10-14 NOTE — Telephone Encounter (Signed)
Sent electronically TG 

## 2018-10-15 ENCOUNTER — Telehealth (INDEPENDENT_AMBULATORY_CARE_PROVIDER_SITE_OTHER): Payer: Self-pay | Admitting: Pediatrics

## 2018-10-15 ENCOUNTER — Other Ambulatory Visit (INDEPENDENT_AMBULATORY_CARE_PROVIDER_SITE_OTHER): Payer: Self-pay | Admitting: Pediatrics

## 2018-10-15 DIAGNOSIS — G40319 Generalized idiopathic epilepsy and epileptic syndromes, intractable, without status epilepticus: Secondary | ICD-10-CM

## 2018-10-15 DIAGNOSIS — G40401 Other generalized epilepsy and epileptic syndromes, not intractable, with status epilepticus: Secondary | ICD-10-CM

## 2018-10-15 MED ORDER — PHENOBARBITAL SODIUM 65 MG/ML IJ SOLN: INTRAMUSCULAR | 5 refills | Status: DC

## 2018-10-15 MED ORDER — LORAZEPAM 2 MG/ML PO CONC: ORAL | 5 refills | Status: DC

## 2018-10-15 NOTE — Telephone Encounter (Signed)
°  Who's calling (name and relationship to patient) : Jeani Hawking ([place]) Best contact number: [phone] Provider they see: Gaynell Face Reason for call: Still have not received refill request for [facility].  The fax should be going to [phone].  Please call.    PRESCRIPTION REFILL ONLY  Name of prescription:  Pharmacy:

## 2018-10-15 NOTE — Telephone Encounter (Signed)
Rx has been sent to the pharmacy

## 2018-10-15 NOTE — Telephone Encounter (Signed)
Sent the rx once again to [facility]

## 2018-10-15 NOTE — Telephone Encounter (Signed)
°  Who's calling (name and relationship to patient) : Jeani Hawking (Pharm)  Best contact number: [phone] Provider they see: Dr. Gaynell Face  Reason for call: Jeani Hawking following up on rxs for pt: Lorazepam and Phenobarbital.      PRESCRIPTION REFILL ONLY  Name of prescription:  Pharmacy: [facility]

## 2018-10-21 ENCOUNTER — Other Ambulatory Visit (INDEPENDENT_AMBULATORY_CARE_PROVIDER_SITE_OTHER): Payer: Self-pay | Admitting: Pediatrics

## 2018-10-21 DIAGNOSIS — G40319 Generalized idiopathic epilepsy and epileptic syndromes, intractable, without status epilepticus: Secondary | ICD-10-CM

## 2018-10-21 NOTE — Telephone Encounter (Signed)
°  Who's calling (name and relationship to patient) : [place] contact number: [phone] Provider they see: Gaynell Face  Reason for call: They do not carry the patient medication.  They need go to local pharmacy for Rx.     PRESCRIPTION REFILL ONLY  Name of prescription:[name]   Pharmacy:

## 2018-10-22 MED ORDER — ONFI 10 MG PO TABS: ORAL_TABLET | ORAL | 3 refills | Status: DC

## 2018-10-22 NOTE — Telephone Encounter (Signed)
Please send to pharmacy.

## 2018-11-08 ENCOUNTER — Telehealth (INDEPENDENT_AMBULATORY_CARE_PROVIDER_SITE_OTHER): Payer: Self-pay | Admitting: Pediatrics

## 2018-11-08 MED ORDER — LACOSAMIDE 50 MG PO TABS: ORAL_TABLET | ORAL | 0 refills | Status: DC

## 2018-11-08 NOTE — Telephone Encounter (Signed)
Rx has been faxed to [facility]

## 2018-11-08 NOTE — Telephone Encounter (Signed)
°  Who's calling (name and relationship to patient) : [facility]   [facility] Call   Best contact number: [phone]  Provider they see: Dr. Gaynell Face   Reason for call: Merleen Nicely from [facility] called about the Vimpat being out and patient needed a refill. After this call Dr. Gaynell Face did call back and connected to pharmacy.     PRESCRIPTION REFILL ONLY  Name of prescription:  Pharmacy:

## 2018-11-26 ENCOUNTER — Other Ambulatory Visit: Payer: Self-pay

## 2018-11-26 ENCOUNTER — Encounter (HOSPITAL_COMMUNITY): Payer: Self-pay

## 2018-11-26 ENCOUNTER — Emergency Department (HOSPITAL_COMMUNITY): Payer: Medicaid Other

## 2018-11-26 ENCOUNTER — Ambulatory Visit (INDEPENDENT_AMBULATORY_CARE_PROVIDER_SITE_OTHER): Payer: Medicaid Other | Admitting: Pediatrics

## 2018-11-26 ENCOUNTER — Emergency Department (HOSPITAL_COMMUNITY)
Admission: EM | Admit: 2018-11-26 | Discharge: 2018-11-26 | Disposition: A | Discharge status: Home / Self Care | Payer: Medicaid Other | Attending: Emergency Medicine | Admitting: Emergency Medicine

## 2018-11-26 DIAGNOSIS — G40909 Epilepsy, unspecified, not intractable, without status epilepticus: Secondary | ICD-10-CM | POA: Insufficient documentation

## 2018-11-26 DIAGNOSIS — E039 Hypothyroidism, unspecified: Secondary | ICD-10-CM | POA: Diagnosis not present

## 2018-11-26 DIAGNOSIS — Z79899 Other long term (current) drug therapy: Secondary | ICD-10-CM | POA: Diagnosis not present

## 2018-11-26 DIAGNOSIS — J069 Acute upper respiratory infection, unspecified: Secondary | ICD-10-CM | POA: Diagnosis not present

## 2018-11-26 DIAGNOSIS — R569 Unspecified convulsions: Secondary | ICD-10-CM

## 2018-11-26 LAB — COMPREHENSIVE METABOLIC PANEL
ALT: 22 U/L (ref 0–44)
AST: 22 U/L (ref 15–41)
Albumin: 3.3 g/dL — ABNORMAL LOW (ref 3.5–5.0)
Alkaline Phosphatase: 84 U/L (ref 38–126)
Anion gap: 10 (ref 5–15)
BUN: 9 mg/dL (ref 6–20)
CO2: 18 mmol/L — ABNORMAL LOW (ref 22–32)
Calcium: 8.6 mg/dL — ABNORMAL LOW (ref 8.9–10.3)
Chloride: 108 mmol/L (ref 98–111)
Creatinine, Ser: 1.03 mg/dL (ref 0.61–1.24)
GFR calc Af Amer: >60 mL/min (ref >60)
GFR calc non Af Amer: >60 mL/min (ref >60)
Glucose, Bld: 121 mg/dL — ABNORMAL HIGH (ref 70–99)
Potassium: 3.6 mmol/L (ref 3.5–5.1)
SODIUM: 136 mmol/L (ref 135–145)
Total Bilirubin: 0.7 mg/dL (ref 0.3–1.2)
Total Protein: 6.8 g/dL (ref 6.5–8.1)

## 2018-11-26 LAB — URINALYSIS, ROUTINE W REFLEX MICROSCOPIC
Bacteria, UA: NONE SEEN
Bilirubin Urine: NEGATIVE
Glucose, UA: 50 mg/dL — AB
Ketones, ur: NEGATIVE mg/dL
Leukocytes,Ua: NEGATIVE
Nitrite: NEGATIVE
Protein, ur: NEGATIVE mg/dL
SPECIFIC GRAVITY, URINE: 1.017 (ref 1.005–1.030)
pH: 6 (ref 5.0–8.0)

## 2018-11-26 LAB — CBC WITH DIFFERENTIAL/PLATELET
Abs Immature Granulocytes: 0.04 10*3/uL (ref 0.00–0.07)
Basophils Absolute: 0 10*3/uL (ref 0.0–0.1)
Basophils Relative: 0 %
Eosinophils Absolute: 0.1 10*3/uL (ref 0.0–0.5)
Eosinophils Relative: 2 %
HCT: 39.4 % (ref 39.0–52.0)
Hemoglobin: 12.2 g/dL — ABNORMAL LOW (ref 13.0–17.0)
Immature Granulocytes: 1 %
LYMPHS ABS: 0.9 10*3/uL (ref 0.7–4.0)
Lymphocytes Relative: 11 %
MCH: 27.1 pg (ref 26.0–34.0)
MCHC: 31 g/dL (ref 30.0–36.0)
MCV: 87.6 fL (ref 80.0–100.0)
Monocytes Absolute: 0.7 10*3/uL (ref 0.1–1.0)
Monocytes Relative: 9 %
Neutro Abs: 6.4 10*3/uL (ref 1.7–7.7)
Neutrophils Relative %: 77 %
Platelets: 247 10*3/uL (ref 150–400)
RBC: 4.5 MIL/uL (ref 4.22–5.81)
RDW: 13.4 % (ref 11.5–15.5)
WBC: 8.2 10*3/uL (ref 4.0–10.5)
nRBC: 0 % (ref 0.0–0.2)

## 2018-11-26 LAB — LACTIC ACID, PLASMA: Lactic Acid, Venous: 1.7 mmol/L (ref 0.5–1.9)

## 2018-11-26 LAB — INFLUENZA PANEL BY PCR (TYPE A & B)
Influenza A By PCR: NEGATIVE
Influenza B By PCR: NEGATIVE

## 2018-11-26 LAB — CARBAMAZEPINE LEVEL, TOTAL: Carbamazepine Lvl: 10.3 ug/mL (ref 4.0–12.0)

## 2018-11-26 MED ORDER — DOXYCYCLINE HYCLATE 100 MG PO CAPS: 100.0000 mg | ORAL_CAPSULE | Freq: Two times a day (BID) | ORAL | 0 refills | Status: DC

## 2018-11-26 MED ORDER — IBUPROFEN 400 MG PO TABS
400.0000 mg | ORAL_TABLET | Freq: Once | ORAL | Status: AC
  Administered 2018-11-26: 400 mg via ORAL
  Filled 2018-11-26: qty 1

## 2018-11-26 NOTE — ED Provider Notes (Signed)
[facility] DEPARTMENT Provider Note   CSN: [id number] Arrival date & time: 11/26/18  1359    History   Chief Complaint Chief Complaint  Patient presents with  . Seizures    HPI [name] is a 33 y.o. male.     33 year old male with history of seizure disorder presents after having witnessed seizure by his mother.  States that lasted for about 5 minutes and was characterized as generalized tonic-clonic activity.  Current seizure activity is similar to his prior.  Patient was in his doctor's office when this happened and was being seen for URI symptoms.  Mother states patient had temp of 100.4 this morning and that was treated with Tylenol.  No reported vomiting or diarrhea but some cough and congestion.  Patient has been compliant with his seizure medications.  Last seizure was about a week ago.  Has seizures often     Past Medical History:  Diagnosis Date  . ALLERGIC RHINITIS 12/26/2009  . Blood clot in vein   . Brown's syndrome   . Growth disorder   . Headache   . Heart murmur    moderate MR  . Hyperlipidemia    No therapy  . Hypokalemia   . Mental retardation, mild (I.Q. 50-70)   . MITRAL VALVE PROLAPSE 06/22/2007  . SEIZURE DISORDER 06/22/2007  . Sleep apnea    cpap , last sleep study 10/01/11  . Unspecified hypothyroidism 06/22/2007    Patient Active Problem List   Diagnosis Date Noted  . Migraine without aura and without status migrainosus, not intractable 02/22/2018  . 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 Hypokalemia 08/03/2013  . Hypothyroidism 08/02/2013  . Subtherapeutic international normalized ratio (INR) 08/02/2013  . Contracture of ankle and foot joint 03/29/2013  . 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 Acute venous embolism and thrombosis of subclavian veins ([place]) 12/21/2012  . Acute venous embolism and  thrombosis of internal jugular veins ([place]) 12/21/2012  . 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 Mechanical complication of other vascular device, implant, and graft 12/21/2012  . 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 Long term (current) use of anticoagulants 05/21/2012  . Mitral regurgitation 04/29/2012  . A-fib ([place]) 04/08/2012  . Status epilepticus ([place]) 04/08/2012  . Irregular heart beat   . DVT of upper extremity (deep vein thrombosis) ([place]) 09/04/2011  . 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 Allergic rhinitis 12/26/2009  . MITRAL VALVE PROLAPSE 06/22/2007    Past Surgical History:  Procedure Laterality Date  . ANKLE SURGERY Left   . EYE SURGERY     X2 for Brown Syndrome  . IMPLANTATION VAGAL NERVE STIMULATOR     Left  . PORTACATH PLACEMENT     and removal 04/08/12        Home Medications    Prior to Admission medications   Medication Sig Start Date End Date Taking? Authorizing Provider  acetaminophen (TYLENOL) 500 MG tablet Take 1,000 mg by mouth every 6 (six) hours as needed for headache.    [provider]  carbamazepine (TEGRETOL) 100 MG chewable tablet Chew 2 tablets in the morning, 2 tablets at midday and 1+1/2 tablets in the evening 08/25/18   Jodi Geralds, MD  cetirizine (ZYRTEC) 10 MG tablet Take 10 mg by mouth daily. 02/12/15   [provider]  Dextromethorphan-guaiFENesin 5-100 MG/5ML LIQD Take 10 mLs by mouth 2 (two) times daily as  needed (cough).    [provider]  diazepam (VALIUM) 2 MG tablet Take 1 tablet every 6 - 8 hours as needed for anxiety or seizures Patient taking differently: Take 2 mg by mouth every 6 (six) hours as needed for anxiety (seizures).  12/24/16   Jodi Geralds, MD  Flaxseed, Linseed, (FLAXSEED OIL) 1000 MG CAPS Take 1,000 mg by mouth daily.    [provider]  guaiFENesin (MUCINEX) 600 MG 12 hr tablet Take 600 mg by mouth 2 (two) times daily.    [provider]    hydrocortisone (ANUSOL-HC) 25 MG suppository Place 1 suppository (25 mg total) rectally at bedtime. Patient taking differently: Place 25 mg rectally at bedtime as needed for hemorrhoids or anal itching.  01/11/18   Mauri Pole, MD  Lacosamide (VIMPAT) 100 MG TABS Take 100 mg by mouth 2 (two) times daily.    [provider]  lacosamide (VIMPAT) 50 MG TABS tablet TAKE 2 TABLETS BY MOUTH IN THE MORNING AND 2 TABLETS AT NIGHT 11/08/18   Jodi Geralds, MD  levETIRAcetam (KEPPRA) 500 MG tablet Take 2 1/2 tablets by mouth twice per day Patient taking differently: Take 1,000-1,250 mg by mouth See admin instructions. Take 2 1/2 tablets (1250mg ) in the AM and 2 tablets (1000mg ) in the evening 08/25/18   Jodi Geralds, MD  levothyroxine (SYNTHROID, LEVOTHROID) 25 MCG tablet Take 25 mcg by mouth daily before breakfast.     [provider]  LORazepam (ATIVAN) 2 MG/ML concentrated solution Withdraw 25ml (2mg ) and 23ml  Normal saline into 3 ml syringe. Administer IV push over 3-5 minutes as needed for recurrent seizures 10/15/18   Jodi Geralds, MD  montelukast (SINGULAIR) 10 MG tablet Take 10 mg by mouth at bedtime. 02/05/18   [provider]  Multiple Vitamin (MULTIVITAMIN WITH MINERALS) TABS Take 1 tablet by mouth daily.    [provider]  Omega-3 Fatty Acids (OMEGA 3 PO) Take 500 mg by mouth daily.     [provider]  ondansetron (ZOFRAN-ODT) 4 MG disintegrating tablet Take 1 tablet as needed for nausea and vomiting Patient taking differently: Take 4 mg by mouth every 8 (eight) hours as needed (nausea and vomiting related to migraines).  09/27/15   Jodi Geralds, MD  [name] 10 MG tablet TAKE 1 TABLET BY MOUTH IN THE MORNING, 1 TABLET IN THE AFTERNOON, AND 2 TABLETS AT BEDTIME 10/22/18   Jodi Geralds, MD  PHENObarbital (LUMINAL) 65 MG/ML injection Withdraw 49ml (65mg ) Phenobarbital and 64ml Normal Saline into 47ml syringe. [place] IV push  for 3-5 minutes as needed for recurrent seizures 10/15/18   Jodi Geralds, MD  potassium chloride SA (K-DUR,KLOR-CON) 20 MEQ tablet Take 1 tablet (20 mEq total) by mouth daily. 03/03/18   Rockwell Germany, NP  potassium phosphate, monobasic, (K-PHOS) 500 MG tablet TAKE 1 TABLET (500MG ) BY MOUTH FOUR TIMES A DAY 08/09/18   Jodi Geralds, MD  potassium phosphate, monobasic, (K-PHOS) 500 MG tablet TAKE 1 TABLET (500mg ) BY MOUTH FOUR TIMES A DAY Patient not taking: Reported on 09/06/2018 08/25/18   Jodi Geralds, MD  promethazine (PHENERGAN) 25 MG suppository Place 1 suppository (25 mg total) rectally every 6 (six) hours as needed for nausea or vomiting. 08/18/17   Little, Wenda Overland, MD  rosuvastatin (CRESTOR) 10 MG tablet Take 10 mg by mouth at bedtime. 05/26/16   [provider]  TROKENDI XR 200 MG CP24 Take 1 capsule by mouth at  bedtime Patient taking differently: Take 200 mg by mouth at bedtime. Take 1 capsule (200mg )  by mouth at bedtime 08/25/18   Jodi Geralds, MD  TROKENDI XR 50 MG CP24 Take 2 capsules by mouth at bedtime. Patient taking differently: Take 50 mg by mouth at bedtime. Taking 1 Capsule (50mg ) at bedtime 08/25/18   Jodi Geralds, MD    Family History Family History  Problem Relation Age of Onset  . Breast cancer Mother   . Colon polyps Mother   . Clotting disorder Mother   . Diabetes Father   . Hypertension Father   . Colon polyps Father   . Hyperlipidemia Father   . Coronary artery disease Maternal Grandfather   . Congestive Heart Failure Maternal Grandfather        Died at 62  . Pneumonia Maternal Grandfather   . Coronary artery disease Paternal Grandfather   . Lung cancer Paternal Grandfather        Died at 34  . Pneumonia Paternal Grandmother        Died at 42  . Stroke Paternal Grandmother     Social History Social History   Tobacco Use  . Smoking status: Never Smoker  . Smokeless tobacco: Never Used  Substance Use  Topics  . Alcohol use: No    Alcohol/week: 0.0 standard drinks  . Drug use: No     Allergies   Antihistamines, chlorpheniramine-type; Divalproex sodium; Phenytoin; Valproic acid and related; and Vancomycin   Review of Systems Review of Systems  All other systems reviewed and are negative.    Physical Exam Updated Vital Signs Ht 1.6 m (5\' 3" )   Wt 59 kg   BMI 23.03 kg/m   Physical Exam Vitals signs and nursing note reviewed.  Constitutional:      General: He is not in acute distress.    Appearance: Normal appearance. He is well-developed. He is not toxic-appearing.  HENT:     Head: Normocephalic and atraumatic.  Eyes:     General: Lids are normal.     Conjunctiva/sclera: Conjunctivae normal.     Pupils: Pupils are equal, round, and reactive to light.  Neck:     Musculoskeletal: Normal range of motion and neck supple.     Thyroid: No thyroid mass.     Trachea: No tracheal deviation.  Cardiovascular:     Rate and Rhythm: Regular rhythm. Tachycardia present.     Heart sounds: Normal heart sounds. No murmur. No gallop.   Pulmonary:     Effort: Pulmonary effort is normal. No respiratory distress.     Breath sounds: Normal breath sounds. No stridor. No decreased breath sounds, wheezing, rhonchi or rales.  Abdominal:     General: Bowel sounds are normal. There is no distension.     Palpations: Abdomen is soft.     Tenderness: There is no abdominal tenderness. There is no rebound.  Musculoskeletal: Normal range of motion.        General: No tenderness.  Skin:    General: Skin is warm and dry.     Findings: No abrasion or rash.  Neurological:     Mental Status: He is alert and oriented to person, place, and time. Mental status is at baseline.     GCS: GCS eye subscore is 4. GCS verbal subscore is 5. GCS motor subscore is 6.     Cranial Nerves: No cranial nerve deficit.     Sensory: No sensory deficit.  Psychiatric:  Mood and Affect: Affect is flat.      ED  Treatments / Results  Labs (all labs ordered are listed, but only abnormal results are displayed) Labs Reviewed  CULTURE, BLOOD (ROUTINE X 2)  CULTURE, BLOOD (ROUTINE X 2)  LACTIC ACID, PLASMA  LACTIC ACID, PLASMA  COMPREHENSIVE METABOLIC PANEL  CBC WITH DIFFERENTIAL/PLATELET  URINALYSIS, ROUTINE W REFLEX MICROSCOPIC  INFLUENZA PANEL BY PCR (TYPE A & B)  CARBAMAZEPINE LEVEL, TOTAL    EKG None  Radiology No results found.  Procedures Procedures (including critical care time)  Medications Ordered in ED Medications  ibuprofen (ADVIL,MOTRIN) tablet 400 mg (has no administration in time range)     Initial Impression / Assessment and Plan / ED Course  I have reviewed the triage vital signs and the nursing notes.  Pertinent labs & imaging results that were available during my care of the patient were reviewed by me and considered in my medical decision making (see chart for details).        Pt back to baseline now Has uri sx which is likely cause of seizure cxr neg Flu test pending Care signed out to dr Reather Converse  Final Clinical Impressions(s) / ED Diagnoses   Final diagnoses:  None    ED Discharge Orders    None       Lacretia Leigh, MD 11/29/18 1312

## 2018-11-26 NOTE — ED Triage Notes (Signed)
Pt arrives [facility] for eval of seizure while at MD office today. Pt w/ hx of same, takes medications w/ good compliance. Reports last seizure before this was Sat & Sun. Pt arrives acting appropriately and baseline per mother. Hx of MR. Mother reports ? fever

## 2018-11-26 NOTE — ED Notes (Signed)
Patient verbalizes understanding of discharge instructions. Opportunity for questioning and answers were provided. 

## 2018-11-26 NOTE — Discharge Instructions (Addendum)
If you were given medicines take as directed.  If you are on coumadin or contraceptives realize their levels and effectiveness is altered by many different medicines.  If you have any reaction (rash, tongues swelling, other) to the medicines stop taking and see a physician.    If your blood pressure was elevated in the ER make sure you follow up for management with a primary doctor or return for chest pain, shortness of breath or stroke symptoms.  Please follow up as directed and return to the ER or see a physician for new or worsening symptoms.  Thank you. Vitals:   11/26/18 1600 11/26/18 1612 11/26/18 1645 11/26/18 1708  BP: 102/60  96/67   Pulse: 96  90 92  Resp: (!) 28  (!) 24 19  Temp:  98.5 F (36.9 C)    TempSrc:  Oral    SpO2: 96%  95% 94%  Weight:      Height:

## 2018-11-26 NOTE — ED Provider Notes (Signed)
Patient CARE signed out to follow-up influenza testing.  Testing reviewed negative.  Vital signs reassuring.  No further seizure activity in the ER.  Patient has seizure medications and outpatient follow-up.  Golda Acre, MD 11/26/18 [phone]

## 2018-11-29 ENCOUNTER — Ambulatory Visit (INDEPENDENT_AMBULATORY_CARE_PROVIDER_SITE_OTHER): Payer: Medicaid Other | Admitting: Pediatrics

## 2018-11-29 ENCOUNTER — Encounter (INDEPENDENT_AMBULATORY_CARE_PROVIDER_SITE_OTHER): Payer: Self-pay | Admitting: Pediatrics

## 2018-11-29 VITALS — BP 110/70 | HR 72 | Ht 63.5 in | Wt 125.4 lb

## 2018-11-29 DIAGNOSIS — G43009 Migraine without aura, not intractable, without status migrainosus: Secondary | ICD-10-CM

## 2018-11-29 DIAGNOSIS — G40319 Generalized idiopathic epilepsy and epileptic syndromes, intractable, without status epilepticus: Secondary | ICD-10-CM

## 2018-11-29 DIAGNOSIS — G40119 Localization-related (focal) (partial) symptomatic epilepsy and epileptic syndromes with simple partial seizures, intractable, without status epilepticus: Secondary | ICD-10-CM | POA: Diagnosis not present

## 2018-11-29 DIAGNOSIS — R2689 Other abnormalities of gait and mobility: Secondary | ICD-10-CM | POA: Diagnosis not present

## 2018-11-29 DIAGNOSIS — F70 Mild intellectual disabilities: Secondary | ICD-10-CM

## 2018-11-29 MED ORDER — VIMPAT 100 MG PO TABS: 100.0000 mg | ORAL_TABLET | Freq: Two times a day (BID) | ORAL | 5 refills | Status: DC

## 2018-11-29 MED ORDER — VIMPAT 50 MG PO TABS: ORAL_TABLET | ORAL | 5 refills | Status: DC

## 2018-11-29 NOTE — Progress Notes (Signed)
Patient: [name] MRN: [id number] Sex: male DOB: 08-05-1986  Provider: Wyline Copas, MD Location of Care: [facility]  Note type: Routine return visit  History of Present Illness: Referral Source: Anastasia Pall, MD History from: both parents, patient and [facility] chart Chief Complaint: Epilepsy  [name] is a 33 y.o. male who returns on November 29, 2018 for the first time since August 25, 2018.  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  He is experiencing increased frequency of headaches in comparison with prior visits.  These include seizures recorded December 9th and December 17th, two on January 6th; one each on January 10th, 11th, 12th, and 27th, one each on February 4th, 7th, 15th, 23rd, 27th, and 28th and one on March 6th.  The one on January 10th, mother saw an abrupt halt, that the seizure which she thought was related to stimulation of the vagal nerve stimulator.  On February 6th, he had a generalized tonic-clonic seizure at school that lasted for about 30 seconds and postictal period of over 5 minutes.  He was brought to the emergency department at [facility] and was assessed for pneumonia and influenza, both of which were negative.  Nonetheless, he has a chronic cough which he continues to exhibit today.  He was placed on doxycycline, which he continues to take.  Since his last visit in December 2019, he has had 3 migraines, one of them occurred after his most recent seizure.  He does not sleep particularly well unless he wears a CPAP.  Usually he knocked it off at some time in the night.  His weight is down about 4-1/2 pounds.  No other concerns were raised by the family.  He takes 5 antiepileptic medications and in  addition has a vagal nerve stimulator.  The vagal nerve stimulator was interrogated and was working well.  I did not reprogram it.  Procedure: Interrogation and reprogramming of Vagal Nerve Stimulator  Implantation of the current stimulator 104 serial # [id number] implanted 04/19/14   Interrogation of the vagal nerve stimulator: Voltage 2.25 mA. Frequency 20 Hz, pulse width 250 microseconds, signal time on30seconds, signal time off 1.1 minutes;magnet current 2.50 mA, magnet signal time on 60 seconds, magnet pulse width 250 microseconds.  The normal mode diagnostics using his current settings revealed the battery is75%full, and communication, output, and impedance are good; 1684ohms. There is no indication change the battery.  There have been 1924 magnet activations since implantation.  Review of Systems: A complete review of systems was remarkable for mom reports that patient has had quite a few seizures since his last visit. She states that patient had one seizure while at his PCP appointment Friday. She states they ended up in the hospital and while he was there, he had another seizure. She reports that they also ran tests for flu and pneumonia. Both tests came back normal. No other concerns at this time., all other systems reviewed and negative.  Past Medical History Diagnosis Date  . ALLERGIC RHINITIS 12/26/2009  . Blood clot in vein   . Brown's syndrome   . Growth disorder   . Headache   . Heart murmur    moderate MR  . Hyperlipidemia    No therapy  . Hypokalemia   . Mental retardation, mild (I.Q. 50-70)   .  MITRAL VALVE PROLAPSE 06/22/2007  . SEIZURE DISORDER 06/22/2007  . Sleep apnea    cpap , last sleep study 10/01/11  . Unspecified hypothyroidism 06/22/2007   Hospitalizations: No., Head Injury: No., Nervous System Infections: No., Immunizations up to date: Yes.    Copied from prior chart Diagnosis of developmental delay as a toddler.   EEG 11/07/86 was normal for  gestational age.   Genetics evaluation short stature, prenatal onset, chromosomal study: 65 XY, fragile X syndrome negative, evaluation for MELAS and MERRF were negative.  Initial seizure 08/28/94 left locomotor with secondary generalization.   MRI of the brain 09/13/93: Scattered subcortical white matter lesions at the parietotemporal parieto-occipital junction is ischemic versus hamartomas.   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   Diagnosis attention deficit disorder inattentive type treated without success with Cylert April 1995  MRI brain 04/04/94 stable differential diagnosis hamartoma, vasculitis, ischemic, or embolic phenomenon.   Status epilepticus 01/31/95, and 04/15/95 Neurontin added to Tegretol   MRI brain 06/14/96 unchanged   Admission to [facility]. Tegretol 30 mcg for militate. 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   Topiramate started in May 1999 unable to be tolerated with Tegretol and was tapered and discontinued.  January 2000 EEG showed right greater than left mid temporal sharp waves was otherwise well-organized EEG. He hospitalizations in March, July, October, in November for recurrent seizures.  Patient placed on Depakote in April 2000 and developed gagging abdominal pain headache and had significant change in transaminases and liver functions. Topamax was restarted and Keppra was added.  Vimpat was started July 16, 2012 and adjusted upwards.   [name] was added on November 18, 2012 and has been adjusted upwards.  Topamax was changed to Trokendi XR in attempt to deal with sleepiness, unsteadiness caused by polypharmacy. We to these medications have been introduced, the patient experiences somewhat improved seizure control however Is quite likely that despite better seizure control, the patient is experiencing impairment in cognition and gait as a result of polypharmacy .  MRI of the brain on March 21, 2013. The patient has extensive calcification to the basal ganglia bilaterally, normal ventricle size, and no acute findings. EKG was unchanged  Birth History He was a breech presentation, cesarean section delivery. He had intrauterine growth retardation, and failure to thrive.  Behavior History Mild intellectual disability  Surgical History Past Surgical History:  Procedure Laterality Date  . ANKLE SURGERY Left   . EYE SURGERY     X2 for Brown Syndrome  . IMPLANTATION VAGAL NERVE STIMULATOR     Left  . PORTACATH PLACEMENT     and removal 04/08/12   Family History family history includes Breast cancer in his mother; Clotting disorder in his mother; Colon polyps in his father and mother; Congestive Heart Failure in his maternal grandfather; Coronary artery disease in his maternal grandfather and paternal grandfather; Diabetes in his father; Hyperlipidemia in his father; Hypertension in his father; Lung cancer in his paternal grandfather; Pneumonia in his maternal grandfather and paternal grandmother; Stroke in his paternal grandmother. Family history is negative for migraines, seizures, intellectual disabilities, blindness, deafness,  birth defects, chromosomal disorder, or autism.  Social History Socioeconomic History  . Marital status: Single  . Years of education:  69  . Highest education level:  High school certificate  Occupational History  . Occupation: Disabled    Fish farm manager: UNEMPLOYED  Social Needs  . Financial resource strain: Not on file  . Food insecurity:    Worry: Not on file     Inability: Not on file  . Transportation needs:    Medical: Not on file    Non-medical: Not on file  Tobacco Use  . Smoking status: Never Smoker  . Smokeless tobacco: Never Used  Substance and Sexual Activity  . Alcohol use: No    Alcohol/week: 0.0 standard drinks  . Drug use: No  . 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     He lives with his parents.     He enjoys helping around the house, Nascar, and football.   Allergies Allergen Reactions  . Antihistamines, Chlorpheniramine-Type Other (See Comments)    Other Reaction: Dimetapp causes seizures  . Divalproex Sodium Other (See Comments)    Nausea, vomiting, liver and kidney dysfunction  . Phenytoin Rash and Other (See Comments)    Dilantin causes more seizures.   . Valproic Acid And Related Other (See Comments)    Shuts down systems  . Vancomycin Rash, Other (See Comments) and Swelling    If given too fast, causes red man reaction Other Reaction: Redman's Syndrome   Physical Exam BP 110/70   Pulse 72   Ht 5' 3.5" (1.613 m)   Wt 125 lb 6.4 oz (56.9 kg)   BMI 21.87 kg/m   General: alert, well developed, well nourished, in no acute distress, brown hair, hazel eyes, right handed Head: Microcephalic, beak like nose, low-lying palate, torticollis with his ear toward his right shoulder and chin toward his left, swollen neck and face, left greater than right Ears, Nose and Throat: Otoscopic: tympanic membranes normal; pharynx: oropharynx is pink without exudates or tonsillar hypertrophy Neck: supple, full range of motion, no cranial or cervical bruits Respiratory: auscultation clear Cardiovascular: no murmurs, pulses are normal Musculoskeletal: Kyphosis, left leg is in a long AFO; he has some flexion contractures in his fingers with slightly flexed posture in his hands his toes are beginning to curl under as  well.  This appears to be a progressive spastic process  Skin: no rashes or neurocutaneous lesions  Neurologic Exam  Mental Status: alert; oriented to person, place and year; knowledge is normal for age; language is normal Cranial Nerves: visual fields are full to double simultaneous stimuli; extraocular movements are full and conjugate; pupils are round reactive to light; funduscopic examination shows sharp disc margins with normal vessels; symmetric facial strength; midline tongue and uvula; air conduction is greater than bone conduction bilaterally Motor: Normal strength, tone and mass; good fine motor movements; no pronator drift Sensory: intact responses to cold, vibration, proprioception and stereognosis Coordination: good finger-to-nose, rapid repetitive alternating movements and finger apposition Gait and Station: Antalgic and diplegic gait and station; balance is fair Romberg exam is negative Reflexes: symmetric and diminished bilaterally; no clonus; bilateral flexor plantar responses  Assessment 1. 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 2. 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 3. Migraine without aura without status migrainosus, not intractable, G43.009. 4. 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 5. Mild  intellectual disability, F70.  Discussion The patient is stable.  However, it seemed to me today that he was following commands a little less well.  I do not know whether he is still experiencing symptoms from his seizure, which occurred 3 days ago or whether this represents a long-term trend.  I did not see any other significant changes in his examination today in comparison with his August 25, 2018.  I am concerned about the frequency of his seizures, but do not know how we can increase his dose to further benefit him.  Plan I refilled his prescriptions for Vimpat.  I checked all the other prescriptions and he does not need refills at  this time.  He will return to see me in 3 months' time.  I will see him sooner based on clinical need.   Medication List   Accurate as of November 29, 2018 10:13 AM.    acetaminophen 500 MG tablet Commonly known as:  TYLENOL Take 1,000 mg by mouth every 6 (six) hours as needed for headache.   carbamazepine 100 MG chewable tablet Commonly known as:  TEGRETOL Chew 2 tablets in the morning, 2 tablets at midday and 1+1/2 tablets in the evening   cetirizine 10 MG tablet Commonly known as:  ZYRTEC Take 10 mg by mouth daily.   Dextromethorphan-guaiFENesin 5-100 MG/5ML Liqd Take 10 mLs by mouth 2 (two) times daily as needed (cough).   diazepam 2 MG tablet Commonly known as:  Valium Take 1 tablet every 6 - 8 hours as needed for anxiety or seizures   doxycycline 100 MG capsule Commonly known as:  VIBRAMYCIN Take 1 capsule (100 mg total) by mouth 2 (two) times daily. One po bid x 7 days   Flaxseed Oil 1000 MG Caps Take 1,000 mg by mouth daily.   guaiFENesin 600 MG 12 hr tablet Commonly known as:  MUCINEX Take 600 mg by mouth 2 (two) times daily.   hydrocortisone 25 MG suppository Commonly known as:  ANUSOL-HC Place 1 suppository (25 mg total) rectally at bedtime.   levETIRAcetam 500 MG tablet Commonly known as:  KEPPRA Take 2 1/2 tablets by mouth twice per day   levothyroxine 25 MCG tablet Commonly known as:  SYNTHROID, LEVOTHROID Take 25 mcg by mouth daily before breakfast.   LORazepam 2 MG/ML concentrated solution Commonly known as:  ATIVAN Withdraw 4ml (2mg ) and 31ml  Normal saline into 3 ml syringe. Administer IV push over 3-5 minutes as needed for recurrent seizures   montelukast 10 MG tablet Commonly known as:  SINGULAIR Take 10 mg by mouth at bedtime.   multivitamin with minerals Tabs tablet Take 1 tablet by mouth daily.   OMEGA 3 PO Take 500 mg by mouth daily.   ondansetron 4 MG disintegrating tablet Commonly known as:  ZOFRAN-ODT Take 1 tablet as needed for  nausea and vomiting   [name] 10 MG tablet Generic drug:  cloBAZam TAKE 1 TABLET BY MOUTH IN THE MORNING, 1 TABLET IN THE AFTERNOON, AND 2 TABLETS AT BEDTIME   PHENObarbital 65 MG/ML injection Commonly known as:  LUMINAL Withdraw 71ml (65mg ) Phenobarbital and 7ml Normal Saline into 71ml syringe. Adminster IV push for 3-5 minutes as needed for recurrent seizures   potassium chloride SA 20 MEQ tablet Commonly known as:  K-DUR,KLOR-CON Take 1 tablet (20 mEq total) by mouth daily.   potassium phosphate (monobasic) 500 MG tablet Commonly known as:  K-Phos TAKE 1 TABLET (500MG ) BY MOUTH FOUR TIMES A DAY   potassium  phosphate (monobasic) 500 MG tablet Commonly known as:  K-Phos TAKE 1 TABLET (500mg ) BY MOUTH FOUR TIMES A DAY   promethazine 25 MG suppository Commonly known as:  PHENERGAN Place 1 suppository (25 mg total) rectally every 6 (six) hours as needed for nausea or vomiting.   rosuvastatin 10 MG tablet Commonly known as:  CRESTOR Take 10 mg by mouth at bedtime.   Trokendi XR 50 MG Cp24 Generic drug:  Topiramate ER Take 2 capsules by mouth at bedtime.   Trokendi XR 200 MG Cp24 Generic drug:  Topiramate ER Take 1 capsule by mouth at bedtime   Vimpat 100 MG Tabs Generic drug:  Lacosamide Take 100 mg by mouth 2 (two) times daily.   Vimpat 50 MG Tabs tablet Commonly known as:  Vimpat TAKE 2 TABLETS BY MOUTH IN THE MORNING AND 2 TABLETS AT NIGHT    The medication list was reviewed and reconciled. All changes or newly prescribed medications were explained.  A complete medication list was provided to the patient/caregiver.  Jodi Geralds MD

## 2018-12-01 LAB — CULTURE, BLOOD (ROUTINE X 2)
Culture: NO GROWTH
Special Requests: ADEQUATE

## 2019-03-02 ENCOUNTER — Other Ambulatory Visit: Payer: Self-pay

## 2019-03-02 ENCOUNTER — Encounter (INDEPENDENT_AMBULATORY_CARE_PROVIDER_SITE_OTHER): Payer: Self-pay | Admitting: Pediatrics

## 2019-03-02 ENCOUNTER — Ambulatory Visit (INDEPENDENT_AMBULATORY_CARE_PROVIDER_SITE_OTHER): Payer: Medicaid Other | Admitting: Pediatrics

## 2019-03-02 VITALS — BP 130/90 | HR 72 | Ht 63.5 in | Wt 128.8 lb

## 2019-03-02 DIAGNOSIS — G40119 Localization-related (focal) (partial) symptomatic epilepsy and epileptic syndromes with simple partial seizures, intractable, without status epilepticus: Secondary | ICD-10-CM

## 2019-03-02 DIAGNOSIS — E876 Hypokalemia: Secondary | ICD-10-CM

## 2019-03-02 DIAGNOSIS — R2689 Other abnormalities of gait and mobility: Secondary | ICD-10-CM

## 2019-03-02 DIAGNOSIS — G40319 Generalized idiopathic epilepsy and epileptic syndromes, intractable, without status epilepticus: Secondary | ICD-10-CM | POA: Diagnosis not present

## 2019-03-02 MED ORDER — VIMPAT 100 MG PO TABS: 100.0000 mg | ORAL_TABLET | Freq: Two times a day (BID) | ORAL | 5 refills | Status: DC

## 2019-03-02 MED ORDER — CARBAMAZEPINE 100 MG PO CHEW: CHEWABLE_TABLET | ORAL | 5 refills | Status: DC

## 2019-03-02 MED ORDER — POTASSIUM PHOSPHATE MONOBASIC 500 MG PO TABS: ORAL_TABLET | ORAL | 5 refills | Status: DC

## 2019-03-02 MED ORDER — ONFI 10 MG PO TABS: ORAL_TABLET | ORAL | 5 refills | Status: DC

## 2019-03-02 NOTE — Progress Notes (Signed)
Patient: [name] MRN: [id number] Sex: male DOB: [birth year]  Provider: Wyline Copas, MD Location of Care: [facility]  Note type: Routine return visit  History of Present Illness: Referral Source: Anastasia Pall, MD History from: both parents, patient and [facility] chart Chief Complaint: Epilepsy  [name] is a 33 y.o. male who returns on March 02, 2019 for the first time since November 29, 2018.  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  He has done quite well in all those areas except for his seizures.  There may have been some other seizures between March 9 and then April 1 but after that his mother has a thorough accounting.  He had seizures on April 1st, 3rd, 4th, 10th, May 19th, and June 6th.  Every single one began with apnea, lasting 45 seconds to a minute and a half.  He then would have tonic posturing of his extremities with minimal amount of clonus.  The apnea caused perioral cyanosis and self-resolved.  Seizures lasted anywhere from 2 to 3 minutes to as long as 5 minutes.  Only one of his seizures seemed to be aborted by his vagal nerve stimulator, on April 10.  It has been my experience that the vagal nerve stimulator can cause some sleep apnea.  I have another patient who clearly demonstrates this.  I think that the patient had his problem before the vagal nerve stimulator was implanted and I am not certain that it has made it any worse.  In general, his health is good.  He had a runny nose today that I think is related to allergies.  He has stayed at home throughout the Coronavirus pandemic.  Today is one of the few days that he has ventured out of his home.  His  parents did not talk about his headaches.  I interrogated his vagal nerve stimulator and it continues to work quite well.  Unfortunately, I forgot to check the number of times that the magnet had been used.  The device was placed on April 19, 2014.  Procedure: Interrogation and reprogramming of Vagal Nerve Stimulator  Implantation of the current stimulator 104 serial # [id number] implanted 04/19/14   Interrogation of the vagal nerve stimulator: Voltage 2.25 mA. Frequency 20 Hz, pulse width 250 microseconds, signal time on30seconds, signal time off 1.1 minutes;magnet current 2.50 mA, magnet signal time on 60 seconds, magnet pulse width 250 microseconds.  The normal mode diagnostics using his current settings revealed the battery is50%full, and communication, output, and impedance are good; 1729ohms. There is no indication change the battery.  There have been 1924 magnet activations since implantation up until November 29, 2018.  Review of Systems: A complete review of systems was remarkable for mom reports that patient has had seven seizures since his last visit. She reports no hospital stays due to keeping the patient isolated. No concerns at this time, all other systems reviewed and negative.  Past Medical History Diagnosis Date  . ALLERGIC RHINITIS 12/26/2009  . Blood clot in vein   . Brown's syndrome   . Growth disorder   . Headache   . Heart murmur    moderate MR  . Hyperlipidemia    No therapy  . Hypokalemia   . Mental  retardation, mild (I.Q. 50-70)   . MITRAL VALVE PROLAPSE 06/22/2007  . SEIZURE DISORDER 06/22/2007  . Sleep apnea    cpap , last sleep study 10/01/11  . Unspecified hypothyroidism 06/22/2007   Hospitalizations: No., Head Injury: No., Nervous System Infections: No., Immunizations up to date: Yes.    Copied from prior chart Diagnosis of developmental delay as a toddler.   EEG [birth year]07[birth year] was normal for gestational age.   Genetics evaluation short stature, prenatal  onset, chromosomal study: 36 XY, fragile X syndrome negative, evaluation for MELAS and MERRF were negative.  Initial seizure 08/28/94 left locomotor with secondary generalization.   MRI of the brain 09/13/93: Scattered subcortical white matter lesions at the parietotemporal parieto-occipital junction is ischemic versus hamartomas.   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   Diagnosis attention deficit disorder inattentive type treated without success with Cylert April 1995  MRI brain 04/04/94 stable differential diagnosis hamartoma, vasculitis, ischemic, or embolic phenomenon.   Status epilepticus 01/31/95, and 04/15/95 Neurontin added to Tegretol   MRI brain 06/14/96 unchanged   Admission to [facility]. Tegretol 30 mcg for militate. 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   Topiramate started in May 1999 unable to be tolerated with Tegretol and was tapered and discontinued.  January 2000 EEG showed right greater than left mid temporal sharp waves was otherwise well-organized EEG. He hospitalizations in March, July, October, in November for recurrent seizures.  Patient placed on Depakote in April 2000 and developed gagging abdominal pain headache and had significant change in transaminases and liver functions. Topamax was restarted and Keppra was added.  Vimpat was started July 16, 2012 and adjusted upwards.   [name] was added on November 18, 2012 and has been adjusted upwards. Topamax was changed to Trokendi XR in attempt to deal with  sleepiness, unsteadiness caused by polypharmacy. We to these medications have been introduced, the patient experiences somewhat improved seizure control however Is quite likely that despite better seizure control, the patient is experiencing impairment in cognition and gait as a result of polypharmacy .  MRI of the brain on March 21, 2013. The patient has extensive calcification to the basal ganglia bilaterally, normal ventricle size, and no acute findings. EKG was unchanged  Birth History He was a breech presentation, cesarean section delivery. He had intrauterine growth retardation, and failure to thrive.  Behavior History Mild intellectual disability  Surgical History Procedure Laterality Date  . ANKLE SURGERY Left   . EYE SURGERY     X2 for Brown Syndrome  . IMPLANTATION VAGAL NERVE STIMULATOR     Left  . PORTACATH PLACEMENT     and removal 04/08/12   Family History family history includes Breast cancer in his mother; Clotting disorder in his mother; Colon polyps in his father and mother; Congestive Heart Failure in his maternal grandfather; Coronary artery disease in his maternal grandfather and paternal grandfather; Diabetes in his father; Hyperlipidemia in his father; Hypertension in his father; Lung cancer in his paternal grandfather; Pneumonia in his maternal grandfather and paternal grandmother; Stroke in his paternal grandmother. Family history is negative for migraines, seizures, intellectual  disabilities, blindness, deafness, birth defects, chromosomal disorder, or autism.  Social History Social History   Socioeconomic History  . Marital status: Single  . Years of education:  13 years  . Highest education level: Not on file  Occupational History  . Occupation: Disabled    Fish farm manager: UNEMPLOYED  Social Needs  . Financial resource strain: Not on file  . Food insecurity:    Worry: Not on file    Inability: Not on file  . Transportation needs:    Medical: Not on file     Non-medical: Not on file  Tobacco Use  . Smoking status: Never Smoker  . Smokeless tobacco: Never Used  Substance and Sexual Activity  . Alcohol use: No    Alcohol/week: 0.0 standard drinks  . Drug use: No  . Sexual activity: Never  Lifestyle  . Physical activity:    Days per week: Not on file    Minutes per session: Not on file  . Stress: Not on file  Relationships  . Social connections:    Talks on phone: Not on file    Gets together: Not on file    Attends religious service: Not on file    Active member of club or organization: Not on file    Attends meetings of clubs or organizations: Not on file    Relationship status: Not on file  Other Topics Concern  . 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     He lives with his parents.     He enjoys helping around the house, Nascar, and football.   Allergies Allergen Reactions  . Antihistamines, Chlorpheniramine-Type Other (See Comments)    Other Reaction: Dimetapp causes seizures  . Divalproex Sodium Other (See Comments)    Nausea, vomiting, liver and kidney dysfunction  . Phenytoin Rash and Other (See Comments)    Dilantin causes more seizures.   . Valproic Acid And Related Other (See Comments)    Shuts down systems  . Vancomycin Rash, Other (See Comments) and Swelling    If given too fast, causes red man reaction Other Reaction: Redman's Syndrome   Physical Exam BP 130/90   Pulse 72   Ht 5' 3.5" (1.613 m)   Wt 128 lb 12.8 oz (58.4 kg)   BMI 22.46 kg/m   General: alert, well developed, well nourished, in no acute distress, brown hair, hazel eyes, right handed Head: microcephalic, beak like nose, low-lying palate, torticollis with his ear toward his right shoulder and chin toward his left, swollen neck and face, left greater than right Ears, Nose and Throat: Otoscopic: tympanic membranes normal; pharynx:  oropharynx is pink without exudates or tonsillar hypertrophy Neck: supple, full range of motion, no cranial or cervical bruits Respiratory: auscultation clear Cardiovascular: no murmurs, pulses are normal Musculoskeletal: kyphosis, left leg is in a long AFO; he has some flexion contractures in his fingers with slightly flexed posture in his hands his toes are beginning to curl under as well.  This appears to be a progressive spastic process  Skin: no rashes or neurocutaneous lesions  Neurologic Exam  Mental Status: alert; oriented to person, place; knowledge is below normal for age; language is normal, speech is mildly dysarthric but intelligible Cranial Nerves: visual fields are full to double simultaneous stimuli; extraocular movements are full and conjugate; pupils are round reactive to light; funduscopic examination  shows sharp disc margins with normal vessels; symmetric facial strength; midline tongue and uvula; air conduction is greater than bone conduction bilaterally Motor: mild proximal weakness, tone and mass; good fine motor movements; no pronator drift Sensory: intact responses to cold, vibration, proprioception and stereognosis Coordination: good finger-to-nose, rapid repetitive alternating movements and finger apposition Gait and Station: antalgic and diplegic gait and station; balance is fair; Romberg exam is negative Reflexes: symmetric and diminished bilaterally; no clonus; bilateral flexor plantar responses  Assessment 1.  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 2.  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 3.  Migraine without aura without status migrainosus, not intractable, G43.009. 4.  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 5.  Mild intellectual disability, F70.  Discussion [name] is medically and neurologically stable.  I am concerned about the frequency of his seizures which seems to be up a bit, but I will leave that there is  much that we can do to change his medications to bring about better control.  We are near max amounts of stimulation with the vagal nerve stimulator.  It has seemed to become depleted more quickly recently for reasons that are unclear to me.  Plan His parents mentioned that his ophthalmologist is ready to take out the cataracts.  I told them to have the office contact me formally and I will respond.  I think that they may need to establish an IV, so that he can receive phenobarbital and Ativan.  I will let them know about the dose that is given and the fact that it seems to work better than any other treatment to stop seizures.  It is unlikely that he has seizure during his procedure but it would be important to be prepared.  In case he were to have a seizure, it might be worthwhile to sterilely open up his port for IV access.  [name] will return to see me in 3 months' time.  I refilled prescriptions for Vimpat, [name], K-Phos, and carbamazepine.   Medication List   Accurate as of March 02, 2019 10:04 AM. If you have any questions, ask your nurse or doctor.    acetaminophen 500 MG tablet Commonly known as:  TYLENOL Take 1,000 mg by mouth every 6 (six) hours as needed for headache.   carbamazepine 100 MG chewable tablet Commonly known as:  TEGRETOL Chew 2 tablets in the morning, 2 tablets at midday and 1+1/2 tablets in the evening   cetirizine 10 MG tablet Commonly known as:  ZYRTEC Take 10 mg by mouth daily.   Dextromethorphan-guaiFENesin 5-100 MG/5ML Liqd Take 10 mLs by mouth 2 (two) times daily as needed (cough).   diazepam 2 MG tablet Commonly known as:  Valium Take 1 tablet every 6 - 8 hours as needed for anxiety or seizures What changed:    how much to take  how to take this  when to take this  reasons to take this  additional instructions   doxycycline 100 MG capsule Commonly known as:  VIBRAMYCIN Take 1 capsule (100 mg total) by mouth 2 (two) times daily. One po bid x 7  days   Flaxseed Oil 1000 MG Caps Take 1,000 mg by mouth daily.   guaiFENesin 600 MG 12 hr tablet Commonly known as:  MUCINEX Take 600 mg by mouth 2 (two) times daily.   hydrocortisone 25 MG suppository Commonly known as:  ANUSOL-HC Place 1 suppository (25 mg total) rectally at bedtime. What changed:    when to take this  reasons  to take this   levETIRAcetam 500 MG tablet Commonly known as:  KEPPRA Take 2 1/2 tablets by mouth twice per day What changed:    how much to take  how to take this  when to take this  additional instructions   levothyroxine 25 MCG tablet Commonly known as:  SYNTHROID Take 25 mcg by mouth daily before breakfast.   LORazepam 2 MG/ML concentrated solution Commonly known as:  ATIVAN Withdraw 27ml (2mg ) and 69ml  Normal saline into 3 ml syringe. Administer IV push over 3-5 minutes as needed for recurrent seizures   montelukast 10 MG tablet Commonly known as:  SINGULAIR Take 10 mg by mouth at bedtime.   multivitamin with minerals Tabs tablet Take 1 tablet by mouth daily.   OMEGA 3 PO Take 500 mg by mouth daily.   ondansetron 4 MG disintegrating tablet Commonly known as:  ZOFRAN-ODT Take 1 tablet as needed for nausea and vomiting What changed:    how much to take  how to take this  when to take this  reasons to take this  additional instructions   [name] 10 MG tablet Generic drug:  cloBAZam TAKE 1 TABLET BY MOUTH IN THE MORNING, 1 TABLET IN THE AFTERNOON, AND 2 TABLETS AT BEDTIME   PHENObarbital 65 MG/ML injection Commonly known as:  LUMINAL Withdraw 27ml (65mg ) Phenobarbital and 64ml Normal Saline into 4ml syringe. Adminster IV push for 3-5 minutes as needed for recurrent seizures   potassium chloride SA 20 MEQ tablet Commonly known as:  K-DUR Take 1 tablet (20 mEq total) by mouth daily.   potassium phosphate (monobasic) 500 MG tablet Commonly known as:  K-Phos TAKE 1 TABLET (500MG ) BY MOUTH FOUR TIMES A DAY   potassium  phosphate (monobasic) 500 MG tablet Commonly known as:  K-Phos TAKE 1 TABLET (500mg ) BY MOUTH FOUR TIMES A DAY   promethazine 25 MG suppository Commonly known as:  PHENERGAN Place 1 suppository (25 mg total) rectally every 6 (six) hours as needed for nausea or vomiting.   rosuvastatin 10 MG tablet Commonly known as:  CRESTOR Take 10 mg by mouth at bedtime.   Trokendi XR 50 MG Cp24 Generic drug:  Topiramate ER Take 2 capsules by mouth at bedtime. What changed:    how much to take  additional instructions   Trokendi XR 200 MG Cp24 Generic drug:  Topiramate ER Take 1 capsule by mouth at bedtime What changed:    how much to take  how to take this  when to take this  additional instructions   Vimpat 100 MG Tabs Generic drug:  Lacosamide Take 1 tablet (100 mg total) by mouth 2 (two) times daily.   Vimpat 50 MG Tabs tablet Generic drug:  lacosamide TAKE 2 TABLETS BY MOUTH IN THE MORNING AND 2 TABLETS AT NIGHT    The medication list was reviewed and reconciled. All changes or newly prescribed medications were explained.  A complete medication list was provided to the patient/caregiver.  Jodi Geralds MD

## 2019-03-02 NOTE — Patient Instructions (Signed)
I am pleased that seizures are relatively infrequent.  I remain concerned that he has apnea beginning each and every 1.  I do not have a solution for that.  Fortunately you are aware of the issue and treat him promptly if he needs it.  I do not see any real changes in him.  I do not have any problem with him having cataract surgery but I think we need to send a letter so that they understand how best to handle, should he have a seizure during the procedure.  Please let me know if there is anything else that I can do for you.  I like to see him again in 3 months.

## 2019-04-16 ENCOUNTER — Other Ambulatory Visit (INDEPENDENT_AMBULATORY_CARE_PROVIDER_SITE_OTHER): Payer: Self-pay | Admitting: Pediatrics

## 2019-04-16 DIAGNOSIS — G40319 Generalized idiopathic epilepsy and epileptic syndromes, intractable, without status epilepticus: Secondary | ICD-10-CM

## 2019-04-16 DIAGNOSIS — G40119 Localization-related (focal) (partial) symptomatic epilepsy and epileptic syndromes with simple partial seizures, intractable, without status epilepticus: Secondary | ICD-10-CM

## 2019-05-12 ENCOUNTER — Telehealth (INDEPENDENT_AMBULATORY_CARE_PROVIDER_SITE_OTHER): Payer: Self-pay | Admitting: Family

## 2019-05-12 NOTE — Telephone Encounter (Signed)
A box of magnets was given to [name] who will get in touch with the family.

## 2019-05-12 NOTE — Telephone Encounter (Signed)
Mom has been informed and the magnets have been placed up front for pick up

## 2019-05-12 NOTE — Telephone Encounter (Signed)
°  Who's calling (name and relationship to patient) : Tamela Oddi (mom)  Best contact number: [phone]  Provider they see: Cloretta [name]  Reason for call: LVM for Otila Kluver about patient needing magnets for his VNS.  Please call.   PRESCRIPTION REFILL ONLY  Name of prescription:  Pharmacy:

## 2019-06-02 ENCOUNTER — Other Ambulatory Visit: Payer: Self-pay

## 2019-06-02 ENCOUNTER — Ambulatory Visit (INDEPENDENT_AMBULATORY_CARE_PROVIDER_SITE_OTHER): Payer: Medicaid Other | Admitting: Pediatrics

## 2019-06-02 ENCOUNTER — Encounter (INDEPENDENT_AMBULATORY_CARE_PROVIDER_SITE_OTHER): Payer: Self-pay | Admitting: Pediatrics

## 2019-06-02 VITALS — BP 110/74 | HR 82 | Ht 62.0 in | Wt 129.0 lb

## 2019-06-02 DIAGNOSIS — E876 Hypokalemia: Secondary | ICD-10-CM | POA: Diagnosis not present

## 2019-06-02 DIAGNOSIS — F70 Mild intellectual disabilities: Secondary | ICD-10-CM | POA: Diagnosis not present

## 2019-06-02 DIAGNOSIS — G40319 Generalized idiopathic epilepsy and epileptic syndromes, intractable, without status epilepticus: Secondary | ICD-10-CM | POA: Diagnosis not present

## 2019-06-02 DIAGNOSIS — G40119 Localization-related (focal) (partial) symptomatic epilepsy and epileptic syndromes with simple partial seizures, intractable, without status epilepticus: Secondary | ICD-10-CM

## 2019-06-02 MED ORDER — NAYZILAM 5 MG/0.1ML NA SOLN: NASAL | 5 refills | Status: DC

## 2019-06-02 MED ORDER — POTASSIUM CHLORIDE CRYS ER 20 MEQ PO TBCR: 20.0000 meq | EXTENDED_RELEASE_TABLET | Freq: Every day | ORAL | 5 refills | Status: DC

## 2019-06-02 MED ORDER — K-PHOS 500 MG PO TABS: ORAL_TABLET | ORAL | 4 refills | Status: DC

## 2019-06-02 MED ORDER — TROKENDI XR 50 MG PO CP24: ORAL_CAPSULE | ORAL | 5 refills | Status: DC

## 2019-06-02 NOTE — Progress Notes (Signed)
Patient: [name] MRN: [id number] Sex: male DOB: [birth year]  Provider: Wyline Copas, MD Location of Care: [facility]  Note type: Routine return visit  History of Present Illness: Referral Source: Anastasia Pall, MD History from: patient, [facility] chart and mom Chief Complaint: Epilepsy, refills  [name] is a 33 y.o. male who returns on June 02, 2019, for the first time since March 02, 2019.  He has a complex medical history of intractable epilepsy with mixed seizure types including tonic, generalized tonic-clonic and focal epilepsy, possibly with frontal lobe symptomatology.  He has nonictal and postictal migraines, early onset cataracts, mild intellectual disability, mitral regurgitation, calcifications in his basal ganglia on MRI scan, and obstructive sleep apnea that responds to CPAP.  His seizures continue to be quite frequent.  He had 3 in June, 4 in July, 5 in August, and so far 2 in September.    In June, he had 3 tonic seizures, each lasting about 2 minutes in duration: June 6th, 18th, and 30th.    In July, he had generalized tonic-clonic seizures lasting 5 to 8 minutes on the 1st and the 14th.  The latter occurred when he was on the beach with his family.  These are his most serious seizures and at times lead to apnea which fortunately has not occurred in the past 3 months.  He also has seizures that his mother refers to as "VNS seizures".  It is because he becomes agitated and starts clawing in the air.  He is unresponsive.  Swiping the VNS will curtail the seizure promptly in a way that it does not happen with the tonic or generalized tonic-clonic seizures.  He had a "VNS" seizure on April 02, 2019, and a tonic seizure on March 28, 2019.    In August, there were generalized tonic-clonic seizures on the 3rd and 6th, "VNS" seizures on the 14th and 27th, and a tonic seizure on the 20th.    In September, he has had a generalized  tonic-clonic seizure on the 7th and a tonic seizure on the 9th.  In general, his health is good.  His weight is stable.  His blood pressure is markedly better.  His family has begun to attend church in person.  I am hopeful that everyone stays properly masked and distanced.  The patient continues to enjoy watching NASCAR on TV.  There have been no significant changes in his gait, no problems with migraines.  He has basically stayed at home throughout the entire Coronavirus pandemic because he would be certainly quite vulnerable to serious illness.  I interrogated his vagal nerve stimulator today and it works well.  The results are below.  Procedure: Interrogation and reprogramming of Vagal Nerve Stimulator  Implantation of the current stimulator 104 serial # [id number] implanted 04/19/14   Interrogation of the vagal nerve stimulator: Voltage 2.25 mA. Frequency 20 Hz, pulse width 250 microseconds, signal time on30seconds, signal time off 1.1 minutes;magnet current 2.50 mA, magnet signal time on 60 seconds, magnet pulse width 250 microseconds.  The normal mode diagnostics using his current settings revealed the battery is50%full, and communication, output, and impedance are good; 1684ohms. There is no indication change the battery. There have been 1924 magnet activations since implantation up until November 29, 2018.  I did not record the magnet activations.  Review of Systems: A complete review of systems was remarkable for seizures. Petite Mal Seizures on 6/62020, 03/10/19, 03/22/19, 03/28/19, 05/12/19, 06/02/2019. Grand Mal Seizures  on 03/23/2019, 04/05/19, 04/25/19, 04/28/19, 05/30/19. VNS Seizures on 04/02/2019, 05/06/2019, all other systems reviewed and negative.  Past Medical History Diagnosis Date  . ALLERGIC RHINITIS 12/26/2009  . Blood clot in vein   . Brown's syndrome   . Growth disorder   . Headache   . Heart murmur    moderate MR  . Hyperlipidemia    No therapy  . Hypokalemia   . Mental  retardation, mild (I.Q. 50-70)   . MITRAL VALVE PROLAPSE 06/22/2007  . SEIZURE DISORDER 06/22/2007  . Sleep apnea    cpap , last sleep study 10/01/11  . Unspecified hypothyroidism 06/22/2007   Hospitalizations: No., Head Injury: No., Nervous System Infections: No., Immunizations up to date: Yes.    Copied from prior chart Diagnosis of developmental delay as a toddler.   EEG 11/07/86 was normal for gestational age.   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  Initial seizure 08/28/94 left locomotor with secondary generalization.   MRI of the brain 09/13/93: Scattered subcortical white matter lesions at the parietotemporal parieto-occipital junction is ischemic versus hamartomas.   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   Diagnosis attention deficit disorder inattentive type treated without success with Cylert April 1995  MRI brain 04/04/94 stable differential diagnosis hamartoma, vasculitis, ischemic, or embolic phenomenon.   Status epilepticus 01/31/95, and 04/15/95 Neurontin added to Tegretol   MRI brain 06/14/96 unchanged   [facility]mission to [facility]. Tegretol 30 mcg for militate. 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   Topiramate started in May 1999 unable to be tolerated with Tegretol and was tapered and discontinued.  January 2000 EEG showed right greater than left mid temporal sharp waves was otherwise well-organized EEG. He  hospitalizations in March, July, October, in November for recurrent seizures.  Patient placed on Depakote in April 2000 and developed gagging abdominal pain headache and had significant change in transaminases and liver functions. Topamax was restarted and Keppra was added.  Vimpat was started July 16, 2012 and adjusted upwards.   [name] was added on November 18, 2012 and has been adjusted upwards. Topamax was changed to Trokendi XR in attempt to deal with sleepiness, unsteadiness caused by polypharmacy. We to these medications have been introduced, the patient experiences somewhat improved seizure control however Is quite likely that despite better seizure control, the patient is experiencing impairment in cognition and gait as a result of polypharmacy .  MRI of the brain on March 21, 2013. The patient has extensive calcification to the basal ganglia bilaterally, normal ventricle size, and no acute findings. EKG was unchanged  Birth History He was a breech presentation, cesarean section delivery. He had intrauterine growth retardation, and failure to thrive.  Behavior History Mild intellectual disability  Surgical History Procedure Laterality Date  . ANKLE SURGERY Left   . EYE SURGERY     X2 for Brown Syndrome  . IMPLANTATION VAGAL NERVE STIMULATOR     Left  . PORTACATH PLACEMENT     and removal 04/08/12   Family History family history includes  Breast cancer in his mother; Clotting disorder in his mother; Colon polyps in his father and mother; Congestive Heart Failure in his maternal grandfather; Coronary artery disease in his maternal grandfather and paternal grandfather; Diabetes in his father; Hyperlipidemia in his father; Hypertension in his father; Lung cancer in his paternal grandfather; Pneumonia in his maternal grandfather and paternal grandmother; Stroke in his paternal grandmother. Family history is negative for migraines, seizures, intellectual disabilities, blindness,  deafness, birth defects, chromosomal disorder, or autism.  Social History  Socioeconomic History  . Marital status: Single  . Years of education: 109  . Highest education level:  High school certificate  Occupational History  . Occupation: Disabled    Fish farm manager: UNEMPLOYED  Social Needs  . Financial resource strain: Not on file  . Food insecurity    Worry: Not on file    Inability: Not on file  . Transportation needs    Medical: Not on file    Non-medical: Not on file  Tobacco Use  . Smoking status: Never Smoker  . Smokeless tobacco: Never Used  Substance and Sexual Activity  . Alcohol use: No    Alcohol/week: 0.0 standard drinks  . Drug use: No  . 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    He lives with his parents.    He enjoys helping around the house, Nascar, and football.   Allergies Allergen Reactions  . Antihistamines, Chlorpheniramine-Type Other (See Comments)    Other Reaction: Dimetapp causes seizures  . Divalproex Sodium Other (See Comments)    Nausea, vomiting, liver and kidney dysfunction  . Phenytoin Rash and Other (See Comments)    Dilantin causes more seizures.   . Valproic Acid And Related Other (See Comments)    Shuts down systems  . Vancomycin Rash, Other (See Comments) and Swelling    If given too fast, causes red man reaction Other Reaction: Redman's Syndrome   Physical Exam BP 110/74   Pulse 82   Ht 5\' 2"  (1.575 m)   Wt 129 lb (58.5 kg)   HC 22" (55.9 cm)   BMI 23.59 kg/m   General: alert, well developed, well nourished, in no acute distress, brown hair, hazel eyes, right handed Head: normocephalic, no dysmorphic features Ears, Nose and Throat: Otoscopic: tympanic membranes normal; pharynx: oropharynx is pink without exudates or tonsillar hypertrophy Neck: supple, full range of motion, no cranial or cervical bruits  Respiratory: auscultation clear Cardiovascular: no murmurs, pulses are normal Musculoskeletal: no skeletal deformities or apparent scoliosis Skin: no rashes or neurocutaneous lesions  Neurologic Exam  Mental Status: alert; oriented to person; knowledge is below normal for age; language is normal for conversation and following commands as well as conveying thoughts; his speech is dysarthric but intelligible Cranial Nerves: visual fields are full to double simultaneous stimuli; extraocular movements are full and conjugate; pupils are round reactive to light; funduscopic examination shows sharp disc margins with normal vessels; symmetric facial strength; midline tongue and uvula; air conduction is greater than bone conduction bilaterally Motor: mild proximal weakness without focality, tone and mass; clumsy fine motor movements; no pronator drift Sensory: intact responses to cold, vibration, proprioception and stereognosis Coordination: good finger-to-nose, clumsy rapid repetitive alternating movements and finger apposition Gait and Station: broad-based, antalgic, diplegic gait and station; balance is fair; Romberg exam is negative Reflexes: symmetric and diminished bilaterally; no clonus; bilateral flexor plantar  responses  Assessment 1. 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 2. 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 3. Mild intellectual disability, F70. 4. Hypokalemia, E87.6.  Discussion I am concerned about the frequency of his seizures, but do not believe that we have much to offer in terms of adjusting medication.  Plan I have suggested that he try Nayzilam which is a nasal form of Versed when he has his prolonged tonic-clonic seizures.  This will likely require prior authorization.  I also refilled prescriptions for 50-mg Trokendi.  I gave mother a written prescription.  Prescriptions were also written for K-Phos and K-Chlor, his potassium  supplements.  He will return to see me in 3 months' time.  I will see him sooner based on clinical need.  Greater than 50% of a 25-minute visit was spent in counseling and coordination of care.  In addition, his vagal nerve stimulator was interrogated but not reprogrammed.   Medication List   Accurate as of June 02, 2019 11:59 PM. If you have any questions, ask your nurse or doctor.      TAKE these medications   acetaminophen 500 MG tablet Commonly known as: TYLENOL Take 1,000 mg by mouth every 6 (six) hours as needed for headache.   carbamazepine 100 MG chewable tablet Commonly known as: TEGRETOL Chew 2 tablets in the morning, 2 tablets at midday and 1+1/2 tablets in the evening   cetirizine 10 MG tablet Commonly known as: ZYRTEC Take 10 mg by mouth daily.   Dextromethorphan-guaiFENesin 5-100 MG/5ML Liqd Take 10 mLs by mouth 2 (two) times daily as needed (cough).   diazepam 2 MG tablet Commonly known as: Valium Take 1 tablet every 6 - 8 hours as needed for anxiety or seizures What changed:   how much to take  how to take this  when to take this  reasons to take this  additional instructions   Flaxseed Oil 1000 MG Caps Take 1,000 mg by mouth daily.   guaiFENesin 600 MG 12 hr tablet Commonly known as: MUCINEX Take 600 mg by mouth 2 (two) times daily.   hydrocortisone 25 MG suppository Commonly known as: ANUSOL-HC Place 1 suppository (25 mg total) rectally at bedtime. What changed:   when to take this  reasons to take this   levETIRAcetam 500 MG tablet Commonly known as: KEPPRA TAKE 2 1/2 TABLETS BY MOUTH TWICE PER DAY   levothyroxine 25 MCG tablet Commonly known as: SYNTHROID Take 25 mcg by mouth daily before breakfast.   LORazepam 2 MG/ML concentrated solution Commonly known as: ATIVAN Withdraw 6ml (2mg ) and 40ml  Normal saline into 3 ml syringe. Administer IV push over 3-5 minutes as needed for recurrent seizures   montelukast 10 MG tablet  Commonly known as: SINGULAIR Take 10 mg by mouth at bedtime.   multivitamin with minerals Tabs tablet Take 1 tablet by mouth daily.   Nayzilam 5 MG/0.1ML Soln Generic drug: Midazolam Take 5 mg after 2 minutes of seizures, may repeat in 10 minutes if seizures persist. Started by: Wyline Copas, MD   OMEGA 3 PO Take 500 mg by mouth daily.   ondansetron 4 MG disintegrating tablet Commonly known as: ZOFRAN-ODT Take 1 tablet as needed for nausea and vomiting What changed:   how much to take  how to take this  when to take this  reasons to take this  additional instructions   [name] 10 MG tablet Generic drug: cloBAZam TAKE 1 TABLET BY MOUTH IN THE MORNING, 1 TABLET IN  THE AFTERNOON, AND 2 TABLETS AT BEDTIME   PHENObarbital 65 MG/ML injection Commonly known as: LUMINAL Withdraw 33ml (65mg ) Phenobarbital and 58ml Normal Saline into 31ml syringe. Adminster IV push for 3-5 minutes as needed for recurrent seizures   potassium chloride SA 20 MEQ tablet Commonly known as: K-DUR Take 1 tablet (20 mEq total) by mouth daily.   potassium phosphate (monobasic) 500 MG tablet Commonly known as: K-Phos TAKE 1 TABLET (500MG ) BY MOUTH FOUR TIMES A DAY   K-Phos 500 MG tablet Generic drug: potassium phosphate (monobasic) TAKE 1 TABLET (500mg ) BY MOUTH FOUR TIMES A DAY   promethazine 25 MG suppository Commonly known as: PHENERGAN Place 1 suppository (25 mg total) rectally every 6 (six) hours as needed for nausea or vomiting.   rosuvastatin 10 MG tablet Commonly known as: CRESTOR Take 10 mg by mouth at bedtime.   Trokendi XR 200 MG Cp24 Generic drug: Topiramate ER Take 1 capsule by mouth at bedtime What changed:   how much to take  how to take this  when to take this  additional instructions   Trokendi XR 50 MG Cp24 Generic drug: Topiramate ER Take 1 capsule at nighttime What changed:   how much to take  how to take this  when to take this  additional instructions  Changed by: Wyline Copas, MD   Vimpat 100 MG Tabs Generic drug: Lacosamide Take 1 tablet (100 mg total) by mouth 2 (two) times daily. What changed: Another medication with the same name was removed. Continue taking this medication, and follow the directions you see here. Changed by: Wyline Copas, MD    The medication list was reviewed and reconciled. All changes or newly prescribed medications were explained.  A complete medication list was provided to the patient/caregiver.  Jodi Geralds MD

## 2019-06-02 NOTE — Patient Instructions (Signed)
Who was a pleasure to see you today.  I am concerned about the increased frequency of seizures but do not want to make changes in his medications for now.  We will add Nayzilam to his prescriptions and see how that works when he has prolonged seizures.  I refilled prescriptions for the Klor-Con, K-Phos, and 50 mg Trokendi.  The vagal nerve stimulator is working well.  We will see you again in 3 months.

## 2019-06-03 ENCOUNTER — Other Ambulatory Visit (INDEPENDENT_AMBULATORY_CARE_PROVIDER_SITE_OTHER): Payer: Self-pay | Admitting: Pediatrics

## 2019-06-03 DIAGNOSIS — G40319 Generalized idiopathic epilepsy and epileptic syndromes, intractable, without status epilepticus: Secondary | ICD-10-CM

## 2019-06-03 DIAGNOSIS — G40119 Localization-related (focal) (partial) symptomatic epilepsy and epileptic syndromes with simple partial seizures, intractable, without status epilepticus: Secondary | ICD-10-CM

## 2019-06-06 NOTE — Telephone Encounter (Signed)
Please send to the pharmacy °

## 2019-06-24 ENCOUNTER — Ambulatory Visit (INDEPENDENT_AMBULATORY_CARE_PROVIDER_SITE_OTHER): Payer: Medicaid Other | Admitting: Physician Assistant

## 2019-06-24 ENCOUNTER — Encounter: Payer: Self-pay | Admitting: Physician Assistant

## 2019-06-24 VITALS — BP 110/70 | HR 93 | Temp 98.7°F | Ht 63.0 in | Wt 133.5 lb

## 2019-06-24 DIAGNOSIS — K625 Hemorrhage of anus and rectum: Secondary | ICD-10-CM

## 2019-06-24 MED ORDER — NA SULFATE-K SULFATE-MG SULF 17.5-3.13-1.6 GM/177ML PO SOLN: 1.0000 | Freq: Once | ORAL | 0 refills | Status: AC

## 2019-06-24 NOTE — Patient Instructions (Signed)
You have been scheduled for a colonoscopy. Please follow written instructions given to you at your visit today.  Please pick up your prep supplies at the pharmacy within the next 1-3 days. If you use inhalers (even only as needed), please bring them with you on the day of your procedure. Your physician has requested that you go to www.startemmi.com and enter the access code given to you at your visit today. This web site gives a general overview about your procedure. However, you should still follow specific instructions given to you by our office regarding your preparation for the procedure.  We will get clearance from your neurologist.  Please call our office if you have heard from that office in 1 week.

## 2019-06-24 NOTE — Progress Notes (Signed)
Chief Complaint: Bright red blood per rectum  HPI:    [name] is a 33 year old male with a past medical history as listed below including mild intellectual disability, uncontrolled seizure disorder and others, known to Dr. Silverio Decamp, who returns to clinic today for follow-up of his prior blood per rectum.    01/11/2018 patient seen in clinic by Dr. Silverio Decamp with his parents for complaints of small-volume bright red blood per rectum after bowel movement, noted on the toilet paper when he wiped.  He needed help with his personal hygiene is fine motor skills were not well developed.  This was thought secondary to internal hemorrhoids.  Patient was started on Benefiber.  Hemoglobin was checked.  Given Anusol suppositories at bedtime as needed.    04/06/2018 patient return to clinic with constipation and intermittent bright red blood per rectum.  Apparently bleeding and significantly improved after using Anusol suppositories for 2 to 3 days.  Again thought due to internal hemorrhoids had improved.  Continued on fiber and increase fluid intake.  Okay to use Anusol suppositories as needed bedtime to avoid continue she is to run 7 days at a time.    06/02/2019 office visit with his pediatrician.  Discussed uncontrolled seizures.    Today, the patient presents to clinic accompanied by his mother who does assist with history given his mild intellectual disability.  She explains that his bright red blood per rectum has continued ever since being seen last year multiple times by Dr. Silverio Decamp.  Tells me they tried the Anusol suppositories and this never really made a change.  They noticed the most amount of blood when he has a "good bowel movement".  They "cannot see anything on the outside".  Patient's mother is nervous that there might be something else going on given that it has continued and not gotten any better over the past year.    Denies fever, chills, belly pain, change in bowel habits, rectal pain, weight  loss or family history of colon cancer.  Past Medical History:  Diagnosis Date  . ALLERGIC RHINITIS 12/26/2009  . Blood clot in vein   . Brown's syndrome   . Growth disorder   . Headache   . Heart murmur    moderate MR  . Hyperlipidemia    No therapy  . Hypokalemia   . Mental retardation, mild (I.Q. 50-70)   . MITRAL VALVE PROLAPSE 06/22/2007  . SEIZURE DISORDER 06/22/2007  . Sleep apnea    cpap , last sleep study 10/01/11  . Unspecified hypothyroidism 06/22/2007    Past Surgical History:  Procedure Laterality Date  . ANKLE SURGERY Left   . EYE SURGERY     X2 for Brown Syndrome  . IMPLANTATION VAGAL NERVE STIMULATOR     Left  . PORTACATH PLACEMENT     and removal 04/08/12    Current Outpatient Medications  Medication Sig Dispense Refill  . acetaminophen (TYLENOL) 500 MG tablet Take 1,000 mg by mouth every 6 (six) hours as needed for headache.    . carbamazepine (TEGRETOL) 100 MG chewable tablet Chew 2 tablets in the morning, 2 tablets at midday and 1+1/2 tablets in the evening 170 tablet 5  . cetirizine (ZYRTEC) 10 MG tablet Take 10 mg by mouth daily.    Marland Kitchen Dextromethorphan-guaiFENesin 5-100 MG/5ML LIQD Take 10 mLs by mouth 2 (two) times daily as needed (cough).    . diazepam (VALIUM) 2 MG tablet Take 1 tablet every 6 - 8 hours as needed for  anxiety or seizures (Patient taking differently: Take 2 mg by mouth every 6 (six) hours as needed for anxiety (seizures). ) 30 tablet 5  . Flaxseed, Linseed, (FLAXSEED OIL) 1000 MG CAPS Take 1,000 mg by mouth daily.    Marland Kitchen guaiFENesin (MUCINEX) 600 MG 12 hr tablet Take 600 mg by mouth 2 (two) times daily.    . hydrocortisone (ANUSOL-HC) 25 MG suppository Place 1 suppository (25 mg total) rectally at bedtime. (Patient taking differently: Place 25 mg rectally at bedtime as needed for hemorrhoids or anal itching. ) 14 suppository 1  . levETIRAcetam (KEPPRA) 500 MG tablet TAKE 2 1/2 TABLETS BY MOUTH TWICE PER DAY 155 tablet 5  . levothyroxine  (SYNTHROID, LEVOTHROID) 25 MCG tablet Take 25 mcg by mouth daily before breakfast.     . LORazepam (ATIVAN) 2 MG/ML concentrated solution Withdraw 80ml (2mg ) and 89ml  Normal saline into 3 ml syringe. Administer IV push over 3-5 minutes as needed for recurrent seizures 5 mL 5  . montelukast (SINGULAIR) 10 MG tablet Take 10 mg by mouth at bedtime.  6  . Multiple Vitamin (MULTIVITAMIN WITH MINERALS) TABS Take 1 tablet by mouth daily.    Marland Kitchen NAYZILAM 5 MG/0.1ML SOLN Take 5 mg after 2 minutes of seizures, may repeat in 10 minutes if seizures persist. 2 each 5  . Omega-3 Fatty Acids (OMEGA 3 PO) Take 500 mg by mouth daily.     . ondansetron (ZOFRAN-ODT) 4 MG disintegrating tablet Take 1 tablet as needed for nausea and vomiting (Patient taking differently: Take 4 mg by mouth every 8 (eight) hours as needed (nausea and vomiting related to migraines). ) 20 tablet 5  . [name] 10 MG tablet TAKE 1 TABLET BY MOUTH IN THE MORNING, 1 TABLET IN THE AFTERNOON, AND 2 TABLETS AT BEDTIME 124 tablet 5  . PHENObarbital (LUMINAL) 65 MG/ML injection Withdraw 10ml (65mg ) Phenobarbital and 38ml Normal Saline into 32ml syringe. Adminster IV push for 3-5 minutes as needed for recurrent seizures 5 mL 5  . potassium chloride SA (K-DUR) 20 MEQ tablet Take 1 tablet (20 mEq total) by mouth daily. 30 tablet 5  . potassium phosphate, monobasic, (K-PHOS) 500 MG tablet TAKE 1 TABLET (500MG ) BY MOUTH FOUR TIMES A DAY 124 tablet 5  . potassium phosphate, monobasic, (K-PHOS) 500 MG tablet TAKE 1 TABLET (500mg ) BY MOUTH FOUR TIMES A DAY 124 tablet 4  . promethazine (PHENERGAN) 25 MG suppository Place 1 suppository (25 mg total) rectally every 6 (six) hours as needed for nausea or vomiting. 4 each 0  . rosuvastatin (CRESTOR) 10 MG tablet Take 10 mg by mouth at bedtime.  5  . TROKENDI XR 200 MG CP24 Take 1 capsule by mouth at bedtime (Patient taking differently: Take 200 mg by mouth at bedtime. Take 1 capsule (200mg )  by mouth at bedtime) 31 capsule 5   . TROKENDI XR 50 MG CP24 Take 1 capsule at nighttime 31 capsule 5  . VIMPAT 100 MG TABS TAKE 1 TABLET (100 MG) TOTAL BY MOUTH 2 (TWO) TIMES DAILY. 60 tablet 5   No current facility-administered medications for this visit.     Allergies as of 06/24/2019 - Review Complete 06/02/2019  Allergen Reaction Noted  . Antihistamines, chlorpheniramine-type Other (See Comments) 04/21/2014  . Divalproex sodium Other (See Comments)   . Phenytoin Rash and Other (See Comments)   . Valproic acid and related Other (See Comments) 04/21/2014  . Vancomycin Rash, Other (See Comments), and Swelling 01/08/2012    Family History  Problem Relation Age of Onset  . Breast cancer Mother   . Colon polyps Mother   . Clotting disorder Mother   . Diabetes Father   . Hypertension Father   . Colon polyps Father   . Hyperlipidemia Father   . Coronary artery disease Maternal Grandfather   . Congestive Heart Failure Maternal Grandfather        Died at 29  . Pneumonia Maternal Grandfather   . Coronary artery disease Paternal Grandfather   . Lung cancer Paternal Grandfather        Died at 24  . Pneumonia Paternal Grandmother        Died at 72  . Stroke Paternal Grandmother     Social History   Socioeconomic History  . Marital status: Single    Spouse name: Not on file  . Number of children: 0  . Years of education: Not on file  . Highest education level: Not on file  Occupational History  . Occupation: Disabled    Fish farm manager: UNEMPLOYED  Social Needs  . Financial resource strain: Not on file  . Food insecurity    Worry: Not on file    Inability: Not on file  . Transportation needs    Medical: Not on file    Non-medical: Not on file  Tobacco Use  . Smoking status: Never Smoker  . Smokeless tobacco: Never Used  Substance and Sexual Activity  . Alcohol use: No    Alcohol/week: 0.0 standard drinks  . Drug use: No  . Sexual activity: Never  Lifestyle  . Physical activity    Days per week: Not on  file    Minutes per session: Not on file  . Stress: Not on file  Relationships  . Social Herbalist on phone: Not on file    Gets together: Not on file    Attends religious service: Not on file    Active member of club or organization: Not on file    Attends meetings of clubs or organizations: Not on file    Relationship status: Not on file  . Intimate partner violence    Fear of current or ex partner: Not on file    Emotionally abused: Not on file    Physically abused: Not on file    Forced sexual activity: Not on file  Other Topics Concern  . 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    He lives with his parents.    He enjoys helping around the house, Nascar, and football.    Review of Systems:    Constitutional: No weight loss, fever or chills Cardiovascular: No chest pain Respiratory: No SOB  Gastrointestinal: See HPI and otherwise negative   Physical Exam:  Vital signs: BP 110/70   Pulse 93   Temp 98.7 F (37.1 C)   Ht 5\' 3"  (1.6 m)   Wt 133 lb 8 oz (60.6 kg)   BMI 23.65 kg/m   Constitutional:   Mildly intellectually imparied Caucasian male appears to be in NAD, Well developed, Well nourished, alert and cooperative Respiratory: Respirations even and unlabored. Lungs clear to auscultation bilaterally.   No wheezes, crackles, or rhonchi.  Cardiovascular: Normal S1, S2. No MRG. Regular rate and rhythm. No peripheral edema, cyanosis or pallor.  Gastrointestinal:  Soft, nondistended, nontender. No rebound or guarding. Normal bowel sounds. No appreciable  masses or hepatomegaly. Rectal:  Not performed.  Psychiatric: Intellectually impaired  MOST RECENT LABS AND IMAGING: CBC    Component Value Date/Time   WBC 8.2 11/26/2018 1449   RBC 4.50 11/26/2018 1449   HGB 12.2 (L) 11/26/2018 1449   HCT 39.4 11/26/2018 1449   PLT 247 11/26/2018 1449   MCV 87.6  11/26/2018 1449   MCH 27.1 11/26/2018 1449   MCHC 31.0 11/26/2018 1449   RDW 13.4 11/26/2018 1449   LYMPHSABS 0.9 11/26/2018 1449   MONOABS 0.7 11/26/2018 1449   EOSABS 0.1 11/26/2018 1449   BASOSABS 0.0 11/26/2018 1449    CMP     Component Value Date/Time   NA 136 11/26/2018 1449   K 3.6 11/26/2018 1449   CL 108 11/26/2018 1449   CO2 18 (L) 11/26/2018 1449   GLUCOSE 121 (H) 11/26/2018 1449   BUN 9 11/26/2018 1449   CREATININE 1.03 11/26/2018 1449   CALCIUM 8.6 (L) 11/26/2018 1449   PROT 6.8 11/26/2018 1449   ALBUMIN 3.3 (L) 11/26/2018 1449   AST 22 11/26/2018 1449   ALT 22 11/26/2018 1449   ALKPHOS 84 11/26/2018 1449   BILITOT 0.7 11/26/2018 1449   GFRNONAA >60 11/26/2018 1449   GFRAA >60 11/26/2018 1449    Assessment: 1.  Bright red blood per rectum: Continues, worse with a large bowel movement, no change with Anusol suppositories, seems to be "quite a lot of bright red blood"; consider hemorrhoids versus colitis versus other 2.  Uncontrolled seizure disorder  Plan: 1.  Mother is requesting a colonoscopy at this point since it has been a year of bleeding and no change.  Patient was scheduled in the hospital with Dr. Silverio Decamp for colonoscopy.  Did discuss risks, benefits, limitations and alternatives the patient agrees to proceed.  He was scheduled in the hospital due to his uncontrolled seizure disorder. 2.  We will contact patient's physician in regards to seizures to ensure that he is acceptable for anesthesia. 3.  Patient to follow in clinic per recommendations from Dr. Silverio Decamp after time of procedure.  Ellouise Newer, PA-C [facility] 06/24/2019, 1:21 PM  Cc: Chesley Noon, MD

## 2019-06-27 ENCOUNTER — Other Ambulatory Visit (INDEPENDENT_AMBULATORY_CARE_PROVIDER_SITE_OTHER): Payer: Self-pay | Admitting: Pediatrics

## 2019-06-27 DIAGNOSIS — G40119 Localization-related (focal) (partial) symptomatic epilepsy and epileptic syndromes with simple partial seizures, intractable, without status epilepticus: Secondary | ICD-10-CM

## 2019-06-27 DIAGNOSIS — G40319 Generalized idiopathic epilepsy and epileptic syndromes, intractable, without status epilepticus: Secondary | ICD-10-CM

## 2019-06-27 MED ORDER — TROKENDI XR 200 MG PO CP24: 200.0000 mg | ORAL_CAPSULE | Freq: Every day | ORAL | 5 refills | Status: DC

## 2019-06-27 NOTE — Telephone Encounter (Signed)
Please send to the pharmacy °

## 2019-06-27 NOTE — Addendum Note (Signed)
Addended by: Jodi Geralds on: 06/27/2019 02:00 PM   Modules accepted: Orders

## 2019-07-01 NOTE — Progress Notes (Signed)
Reviewed and agree with documentation and assessment and plan. K. Veena Nandigam , MD   

## 2019-08-15 ENCOUNTER — Other Ambulatory Visit: Payer: Self-pay

## 2019-08-15 ENCOUNTER — Encounter (HOSPITAL_COMMUNITY): Payer: Self-pay | Admitting: *Deleted

## 2019-08-15 NOTE — Progress Notes (Signed)
Spoke with robin at dr namdagan office, did office get clearance from dr hickling for 08-22-2019 procedure. Robin to check with beth dr Christen Bame nurse and call back tomorrow 08-16-2019

## 2019-08-16 ENCOUNTER — Encounter (HOSPITAL_COMMUNITY): Payer: Self-pay | Admitting: *Deleted

## 2019-08-16 NOTE — Progress Notes (Signed)
PCP - dr Legrand Como badger Cardiologist - dr Blima Rich 03-02-2019, follow up in 1 year not done  Chest x-ray - 11-26-18 epic EKG - 11-26-18 epic Stress Test - none ECHO - 10-29-15 epic, note said repeat April 2019 not done Cardiac Cath - none Has seizures, last seizure 08-02-19, lov dr Gaynell Face neurology 03-02-2019, mother doris loftin poa stated dr Ginette Otto office getting clearance from dr hickling, none in epic, called and left message with robin at dr namdigam office for beth dr Dicie Beam nurse Seizure after port aa cth placement 2-13 at [place] required intubation  Sleep Study - none CPAP - none  Fasting Blood Sugar - n/a Checks Blood Sugar _____ times a day  Blood Thinner Instructions:n/a Aspirin Instructions:n/a Last Dose:  Anesthesia review: chart to [place] zanetto pa for review  Patient denies shortness of breath, fever, cough and chest pain at PAT appointment   Patient verbalized understanding of instructions that were given to them at the PAT appointment. Patient was also instructed that they will need to review over the PAT instructions again at home before surgery.

## 2019-08-17 ENCOUNTER — Telehealth: Payer: Self-pay | Admitting: *Deleted

## 2019-08-17 ENCOUNTER — Other Ambulatory Visit (HOSPITAL_COMMUNITY): Admission: RE | Admit: 2019-08-17 | Payer: Medicaid Other | Source: Ambulatory Visit

## 2019-08-17 ENCOUNTER — Telehealth (INDEPENDENT_AMBULATORY_CARE_PROVIDER_SITE_OTHER): Payer: Self-pay | Admitting: Pediatrics

## 2019-08-17 NOTE — Telephone Encounter (Signed)
Looking for clearance for his procedure Monday. Is he cleared?

## 2019-08-17 NOTE — Telephone Encounter (Signed)
°  Who's calling (name and relationship to patient) : Patti ([facility]) Best contact number: [phone] Provider they see: Dr. Gaynell Face Reason for call: Precious Bard stated she needs clearance for pt to be able to have procedure done on 11/30.

## 2019-08-17 NOTE — Telephone Encounter (Signed)
===  View-only below this line=== ----- Message ----- From: Timothy Lasso, RN Sent: 08/17/2019  10:49 AM EST To: Oda Kilts, CMA Subject: FW: Clearance                                  I just spoke to them again and she is going to hand deliver the letter and get [place] a response today  ----- Message ----- From: Timothy Lasso, RN Sent: 08/17/2019 To: Timothy Lasso, RN Subject: FW: Clearance                                   ----- Message ----- From: Oda Kilts, CMA Sent: 08/15/2019   5:02 PM EST To: Timothy Lasso, RN Subject: Clearance                                      Carlos Levering from Endo called and said someone is suppose to be getting clearance on this patient from Neurology   Do you know about this  It looks like you was working with Anderson Malta that day

## 2019-08-19 ENCOUNTER — Other Ambulatory Visit (HOSPITAL_COMMUNITY)
Admission: RE | Admit: 2019-08-19 | Discharge: 2019-08-19 | Disposition: A | Discharge status: Home / Self Care | Payer: Medicaid Other | Source: Ambulatory Visit | Attending: Gastroenterology | Admitting: Gastroenterology

## 2019-08-19 DIAGNOSIS — Z01812 Encounter for preprocedural laboratory examination: Secondary | ICD-10-CM | POA: Diagnosis not present

## 2019-08-19 DIAGNOSIS — Z20828 Contact with and (suspected) exposure to other viral communicable diseases: Secondary | ICD-10-CM | POA: Diagnosis not present

## 2019-08-19 LAB — SARS CORONAVIRUS 2 (TAT 6-24 HRS): SARS Coronavirus 2: NEGATIVE

## 2019-08-19 NOTE — Progress Notes (Addendum)
Anesthesia Chart Review   Case: [id number] Date/Time: 08/22/19 0845   Procedure: COLONOSCOPY WITH PROPOFOL (N/A )   Anesthesia type: Monitor Anesthesia Care   Pre-op diagnosis: BRPRP   [facility]: [facility] / [facility]   Surgeon: Marcus Pole, MD      DISCUSSION:32 y.o. never smoker with h/o developmental delay, MVP, HLD, moderate MR, seizure disorder, sleep apnea on CPAP, BRBPR scheduled for above procedure 08/22/2019 with Dr. Harl Perez.    Mother reports last seizure 08/02/2019.  She reports he has been cleared by neurologist to proceed with colonoscopy.  Clearance has not been received at this time.    Mother reports pt was admitted after insertion of port a cath due to seizure post op.  05/13/2012 notes reviewed, pt suffered one seizure in PACU with desaturation down to 60%, given 2mg  IV Versed. Pt again experienced seizure with respiratory compromise and per note "lost is oxygen saturation completely, was not moving air.  We administered IV versed and just as we were about to emergently intubate, he began to move air and oxygen saturation returned with aggressive bag-valve-mask replacement."  He was admitted for over night observation.   Last seen by Neurologist, Dr. Lisabeth Perez, 11/09/2018.  When initially seen by neurology pt was experiencing seizures 2-3 times per month, some of which were prolonged and parents required to administer CPR.  Per last OV note pt has been stable over the last 6 months with no CPR required.  He is maintained on [name], Vimpat, Carbamazepine, Trokendi.  He also has a VNS in place, no changes to stimulation parameters at this visit.    No anesthesia complications noted with procedure 11/12/2017.   Last seen by cardiologist, Dr. Lauree Perez.  Seen for PAF, only one episode during a procedure on 2013 with no recurrence and moderate mitral regurgitation on Echo 2017.  Repeat echo April 2019 recommended per note, this has not been done.   Discussed  with Dr. Smith Perez.  Anticipate pt can proceed with planned procedure barring acute status change and after evaluation DOS. VS: There were no vitals taken for this visit.  PROVIDERS: Marcus Noon, MD is PCP   Marcus Chandler, MD is Cardiologist   Marcus Massed, MD is Neurologist  LABS: SDW (all labs ordered are listed, but only abnormal results are displayed)  Labs Reviewed - No data to display   IMAGES:   EKG:   CV: Echo 10/29/2015 Study Conclusions  - Left ventricle: The cavity size was mildly dilated. Wall   thickness was normal. The estimated ejection fraction was 55%.   Doppler parameters are consistent with abnormal left ventricular   relaxation (grade 1 diastolic dysfunction). - Mitral valve: There was moderate regurgitation. - Left atrium: The atrium was mildly dilated. - Atrial septum: No defect or patent foramen ovale was identified. Past Medical History:  Diagnosis Date  . ALLERGIC RHINITIS 12/26/2009  . Blood clot in vein 2015  . Brown's syndrome   . Complication of anesthesia    seizure after pac surgery at [facility] 2013 had to be intubated, no other anes issurs  . Growth disorder   . Headache    migraines  . Heart murmur    moderate MR ses dr [name]  . Hyperlipidemia    No therapy  . Hypokalemia   . Mental retardation, mild (I.Q. 50-70)   . MITRAL VALVE PROLAPSE 06/22/2007  . SEIZURE DISORDER 06/22/2007   last seizure 08-02-2019  . Sleep apnea  cpap , last sleep study 10/01/11  . Unspecified hypothyroidism 06/22/2007    Past Surgical History:  Procedure Laterality Date  . ANKLE SURGERY Left    fused does not bend, wears right leg brace  . EYE SURGERY     X2 for Brown Syndrome  . IMPLANTATION VAGAL NERVE STIMULATOR     Left leaves on for surgeries  . PORTACATH PLACEMENT  2013 and removed and new placed   right chest    MEDICATIONS: No current facility-administered medications for this encounter.    Marland Kitchen acetaminophen (TYLENOL) 500  MG tablet  . albuterol (ACCUNEB) 1.25 MG/3ML nebulizer solution  . carbamazepine (TEGRETOL) 100 MG chewable tablet  . cetirizine (ZYRTEC) 10 MG tablet  . diazepam (VALIUM) 2 MG tablet  . Flaxseed, Linseed, (FLAXSEED OIL) 1000 MG CAPS  . guaiFENesin (MUCINEX) 600 MG 12 hr tablet  . hydrocortisone (ANUSOL-HC) 25 MG suppository  . Krill Oil (OMEGA-3) 500 MG CAPS  . levETIRAcetam (KEPPRA) 500 MG tablet  . levothyroxine (SYNTHROID, LEVOTHROID) 25 MCG tablet  . LORazepam (ATIVAN) 2 MG/ML concentrated solution  . montelukast (SINGULAIR) 10 MG tablet  . Multiple Vitamin (MULTIVITAMIN WITH MINERALS) TABS  . NAYZILAM 5 MG/0.1ML SOLN  . ondansetron (ZOFRAN-ODT) 4 MG disintegrating tablet  . [name] 10 MG tablet  . PHENObarbital (LUMINAL) 65 MG/ML injection  . potassium chloride SA (K-DUR) 20 MEQ tablet  . potassium phosphate, monobasic, (K-PHOS) 500 MG tablet  . promethazine (PHENERGAN) 12.5 MG suppository  . rosuvastatin (CRESTOR) 10 MG tablet  . TROKENDI XR 200 MG CP24  . TROKENDI XR 50 MG CP24  . VIMPAT 100 MG TABS  . Dextromethorphan-guaiFENesin 5-100 MG/5ML [name] Pre-Surgical Testing [phone] 08/19/19  2:36 PM

## 2019-08-19 NOTE — Anesthesia Preprocedure Evaluation (Addendum)
Anesthesia Evaluation  Patient identified by MRN, date of birth, ID band Patient confused  General Assessment Comment:At baseline per mother  Reviewed: Allergy & Precautions, NPO status , Patient's Chart, lab work & pertinent test results  History of Anesthesia Complications Negative for: history of anesthetic complications  Airway Mallampati: [name]  TM Distance: >3 FB Neck ROM: Full    Dental  (+) Dental Advisory Given, Teeth Intact   Pulmonary sleep apnea and Continuous Positive Airway Pressure Ventilation ,    Pulmonary exam normal        Cardiovascular Normal cardiovascular exam+ Valvular Problems/Murmurs MVP and MR    '17 TTE - LV cavity size was mildly dilated. EF 55%. Grade 1 diastolic dysfunction. Moderate MR. LA mildly dilated.    Neuro/Psych  Headaches, Seizures -, Poorly Controlled,   Mental retardation  negative psych ROS   GI/Hepatic negative GI ROS, Neg liver ROS,   Endo/Other  Hypothyroidism   Renal/GU negative Renal ROS     Musculoskeletal negative musculoskeletal ROS (+)   Abdominal   Peds  (+) mental retardation Hematology negative hematology ROS (+)   Anesthesia Other Findings Covid negative 11/27  Reproductive/Obstetrics                           Anesthesia Physical Anesthesia Plan  ASA: [name]  Anesthesia Plan: MAC   Post-op Pain Management:    Induction: Intravenous  PONV Risk Score and Plan: 1 and Propofol infusion and Treatment may vary due to age or medical condition  Airway Management Planned: Natural Airway and Simple Face Mask  Additional Equipment: None  Intra-op Plan:   Post-operative Plan:   Informed Consent: I have reviewed the patients History and Physical, chart, labs and discussed the procedure including the risks, benefits and alternatives for the proposed anesthesia with the patient or authorized representative who has indicated his/her  understanding and acceptance.       Plan Discussed with: CRNA and Anesthesiologist  Anesthesia Plan Comments:       Anesthesia Quick Evaluation

## 2019-08-22 ENCOUNTER — Encounter (HOSPITAL_COMMUNITY)
Admission: RE | Disposition: A | Discharge status: Home / Self Care | Payer: Self-pay | Source: Home / Self Care | Attending: Gastroenterology

## 2019-08-22 ENCOUNTER — Encounter (HOSPITAL_COMMUNITY): Payer: Self-pay | Admitting: *Deleted

## 2019-08-22 ENCOUNTER — Other Ambulatory Visit: Payer: Self-pay

## 2019-08-22 ENCOUNTER — Ambulatory Visit (HOSPITAL_COMMUNITY): Payer: Medicaid Other | Admitting: Physician Assistant

## 2019-08-22 ENCOUNTER — Ambulatory Visit (HOSPITAL_COMMUNITY)
Admission: RE | Admit: 2019-08-22 | Discharge: 2019-08-22 | Disposition: A | Discharge status: Home / Self Care | Payer: Medicaid Other | Attending: Gastroenterology | Admitting: Gastroenterology

## 2019-08-22 DIAGNOSIS — F70 Mild intellectual disabilities: Secondary | ICD-10-CM | POA: Diagnosis not present

## 2019-08-22 DIAGNOSIS — Z560 Unemployment, unspecified: Secondary | ICD-10-CM | POA: Diagnosis not present

## 2019-08-22 DIAGNOSIS — Z79890 Hormone replacement therapy (postmenopausal): Secondary | ICD-10-CM | POA: Insufficient documentation

## 2019-08-22 DIAGNOSIS — I341 Nonrheumatic mitral (valve) prolapse: Secondary | ICD-10-CM | POA: Insufficient documentation

## 2019-08-22 DIAGNOSIS — K625 Hemorrhage of anus and rectum: Secondary | ICD-10-CM

## 2019-08-22 DIAGNOSIS — D125 Benign neoplasm of sigmoid colon: Secondary | ICD-10-CM | POA: Insufficient documentation

## 2019-08-22 DIAGNOSIS — G4730 Sleep apnea, unspecified: Secondary | ICD-10-CM | POA: Insufficient documentation

## 2019-08-22 DIAGNOSIS — G40909 Epilepsy, unspecified, not intractable, without status epilepticus: Secondary | ICD-10-CM | POA: Diagnosis not present

## 2019-08-22 DIAGNOSIS — K6289 Other specified diseases of anus and rectum: Secondary | ICD-10-CM

## 2019-08-22 DIAGNOSIS — D123 Benign neoplasm of transverse colon: Secondary | ICD-10-CM | POA: Insufficient documentation

## 2019-08-22 DIAGNOSIS — K921 Melena: Secondary | ICD-10-CM | POA: Insufficient documentation

## 2019-08-22 DIAGNOSIS — K635 Polyp of colon: Secondary | ICD-10-CM

## 2019-08-22 DIAGNOSIS — Z79899 Other long term (current) drug therapy: Secondary | ICD-10-CM | POA: Insufficient documentation

## 2019-08-22 DIAGNOSIS — E039 Hypothyroidism, unspecified: Secondary | ICD-10-CM | POA: Insufficient documentation

## 2019-08-22 DIAGNOSIS — E785 Hyperlipidemia, unspecified: Secondary | ICD-10-CM | POA: Insufficient documentation

## 2019-08-22 HISTORY — PX: POLYPECTOMY: SHX5525

## 2019-08-22 HISTORY — PX: HEMOSTASIS CLIP PLACEMENT: SHX6857

## 2019-08-22 HISTORY — PX: BIOPSY: SHX5522

## 2019-08-22 HISTORY — PX: COLONOSCOPY WITH PROPOFOL: SHX5780

## 2019-08-22 HISTORY — DX: Other complications of anesthesia, initial encounter: T88.59XA

## 2019-08-22 SURGERY — COLONOSCOPY WITH PROPOFOL
Anesthesia: Monitor Anesthesia Care

## 2019-08-22 MED ORDER — SODIUM CHLORIDE 0.9 % IV SOLN: INTRAVENOUS | Status: DC

## 2019-08-22 MED ORDER — LIDOCAINE 2% (20 MG/ML) 5 ML SYRINGE
INTRAMUSCULAR | Status: DC | PRN
  Administered 2019-08-22: 75 mg via INTRAVENOUS

## 2019-08-22 MED ORDER — PROPOFOL 10 MG/ML IV BOLUS
INTRAVENOUS | Status: DC | PRN
  Administered 2019-08-22: 30 mg via INTRAVENOUS
  Administered 2019-08-22: 20 mg via INTRAVENOUS

## 2019-08-22 MED ORDER — LACTATED RINGERS IV SOLN
INTRAVENOUS | Status: DC | PRN
  Administered 2019-08-22: 09:00:00 via INTRAVENOUS

## 2019-08-22 MED ORDER — PHENYLEPHRINE 40 MCG/ML (10ML) SYRINGE FOR IV PUSH (FOR BLOOD PRESSURE SUPPORT)
PREFILLED_SYRINGE | INTRAVENOUS | Status: DC | PRN
  Administered 2019-08-22 (×2): 80 ug via INTRAVENOUS

## 2019-08-22 MED ORDER — PROPOFOL 500 MG/50ML IV EMUL
INTRAVENOUS | Status: DC | PRN
  Administered 2019-08-22: 125 ug/kg/min via INTRAVENOUS

## 2019-08-22 SURGICAL SUPPLY — 21 items

## 2019-08-22 NOTE — Telephone Encounter (Signed)
He is, but they never answered their phone on Wednesday afternoon, nor have they answered this morning.

## 2019-08-22 NOTE — Discharge Instructions (Signed)

## 2019-08-22 NOTE — Telephone Encounter (Signed)
The patient is in the endoscopy suite finishing his procedure as we speak.  I was never able to get up with somebody in the lower GI no one ever answer the phone.  I called the Endo suite to find out this information.  I have told them that I will keep the phone in my pocket for the time being and can be available to answer any questions that the gastroenterologist has.

## 2019-08-22 NOTE — H&P (Signed)
[facility] History and Physical   Primary Care Physician:  Chesley Noon, MD   Reason for Procedure:  BRBPR Plan:    Colonoscopy with possible intervention     HPI: [name] is a 33 y.o. male with h/o seizure disorder here for evaluation of rectal bleeding.  The risks and benefits as well as alternatives of endoscopic procedure(s) have been discussed and reviewed. All questions answered. The patient and his mother agrees to proceed.    Past Medical History:  Diagnosis Date  . ALLERGIC RHINITIS 12/26/2009  . Blood clot in vein 2015  . Brown's syndrome   . Complication of anesthesia    seizure after pac surgery at [facility] 2013 had to be intubated, no other anes issurs  . Growth disorder   . Headache    migraines  . Heart murmur    moderate MR ses dr Darcus Pester  . Hyperlipidemia    No therapy  . Hypokalemia   . Mental retardation, mild (I.Q. 50-70)   . MITRAL VALVE PROLAPSE 06/22/2007  . SEIZURE DISORDER 06/22/2007   last seizure 08-02-2019  . Sleep apnea    cpap , last sleep study 10/01/11  . Unspecified hypothyroidism 06/22/2007    Past Surgical History:  Procedure Laterality Date  . ANKLE SURGERY Left    fused does not bend, wears right leg brace  . EYE SURGERY     X2 for Brown Syndrome  . IMPLANTATION VAGAL NERVE STIMULATOR     Left leaves on for surgeries  . PORTACATH PLACEMENT  2013 and removed and new placed   right chest    Prior to Admission medications   Medication Sig Start Date End Date Taking? Authorizing Provider  acetaminophen (TYLENOL) 500 MG tablet Take 1,000 mg by mouth every 6 (six) hours as needed for headache.   Yes [provider]  albuterol (ACCUNEB) 1.25 MG/3ML nebulizer solution Take 1 ampule by nebulization every 4 (four) hours as needed for wheezing.   Yes [provider]  carbamazepine (TEGRETOL) 100 MG chewable tablet Chew 2 tablets in the morning, 2 tablets at midday and 1+1/2 tablets in  the evening Patient taking differently: Chew 150-200 mg by mouth See admin instructions. Take 200 mg in the morning, 200 mg midday, and 150 mg in the evening 03/02/19  Yes Hickling, Princess Bruins, MD  cetirizine (ZYRTEC) 10 MG tablet Take 10 mg by mouth daily. 02/12/15  Yes [provider]  Dextromethorphan-guaiFENesin 5-100 MG/5ML LIQD Take 10 mLs by mouth 2 (two) times daily as needed (cough).   Yes [provider]  diazepam (VALIUM) 2 MG tablet Take 1 tablet every 6 - 8 hours as needed for anxiety or seizures Patient taking differently: Take 2 mg by mouth every 6 (six) hours as needed (seizures).  12/24/16  Yes Hickling, Princess Bruins, MD  Flaxseed, Linseed, (FLAXSEED OIL) 1000 MG CAPS Take 1,000 mg by mouth daily.   Yes [provider]  guaiFENesin (MUCINEX) 600 MG 12 hr tablet Take 600 mg by mouth 2 (two) times daily.   Yes [provider]  hydrocortisone (ANUSOL-HC) 25 MG suppository Place 1 suppository (25 mg total) rectally at bedtime. Patient taking differently: Place 25 mg rectally at bedtime as needed (rectal bleeding).  01/11/18  Yes Nandigam, Venia Minks, MD  Krill Oil (OMEGA-3) 500 MG CAPS Take 500 mg by mouth daily.   Yes [provider]  levETIRAcetam (KEPPRA) 500 MG tablet TAKE 2 1/2 TABLETS BY MOUTH TWICE PER DAY  Patient taking differently: Take 1,000-1,250 mg by mouth as directed. Take 1000 mg in the morning and 1250 mg at night 04/18/19  Yes Hickling, Princess Bruins, MD  levothyroxine (SYNTHROID, LEVOTHROID) 25 MCG tablet Take 25 mcg by mouth daily before breakfast.    Yes [provider]  LORazepam (ATIVAN) 2 MG/ML concentrated solution Withdraw 56ml (2mg ) and 54ml  Normal saline into 3 ml syringe. Administer IV push over 3-5 minutes as needed for recurrent seizures 10/15/18  Yes Hickling, Princess Bruins, MD  montelukast (SINGULAIR) 10 MG tablet Take 10 mg by mouth at bedtime. 02/05/18  Yes [provider]  Multiple Vitamin (MULTIVITAMIN WITH  MINERALS) TABS Take 1 tablet by mouth daily.   Yes [provider]  ondansetron (ZOFRAN-ODT) 4 MG disintegrating tablet Take 1 tablet as needed for nausea and vomiting Patient taking differently: Take 4 mg by mouth every 8 (eight) hours as needed (nausea and vomiting related to migraines).  09/27/15  Yes Hickling, Princess Bruins, MD  [name] 10 MG tablet TAKE 1 TABLET BY MOUTH IN THE MORNING, 1 TABLET IN THE AFTERNOON, AND 2 TABLETS AT BEDTIME Patient taking differently: Take 10-20 mg by mouth See admin instructions. Take 10 mg in the morning, 10 mg in the afternoon, and 20 mg at bedtime 03/02/19  Yes Hickling, Princess Bruins, MD  PHENObarbital (LUMINAL) 65 MG/ML injection Withdraw 17ml (65mg ) Phenobarbital and 73ml Normal Saline into 81ml syringe. Adminster IV push for 3-5 minutes as needed for recurrent seizures 10/15/18  Yes Hickling, Princess Bruins, MD  potassium chloride SA (K-DUR) 20 MEQ tablet Take 1 tablet (20 mEq total) by mouth daily. 06/02/19  Yes Hickling, Princess Bruins, MD  potassium phosphate, monobasic, (K-PHOS) 500 MG tablet TAKE 1 TABLET (500MG ) BY MOUTH FOUR TIMES A DAY Patient taking differently: Take 500 mg by mouth 4 (four) times daily.  03/02/19  Yes Jodi Geralds, MD  promethazine (PHENERGAN) 12.5 MG suppository Place 12.5 mg rectally every 6 (six) hours as needed for nausea/vomiting. 07/04/19  Yes [provider]  rosuvastatin (CRESTOR) 10 MG tablet Take 10 mg by mouth at bedtime. 05/26/16  Yes [provider]  TROKENDI XR 200 MG CP24 Take 200 mg by mouth at bedtime. Take 1 capsule (200mg )  by mouth at bedtime Patient taking differently: Take 200 mg by mouth at bedtime. Take with the 50 mg to equal 250 mg at bedtime 06/27/19  Yes Hickling, Princess Bruins, MD  TROKENDI XR 50 MG CP24 Take 1 capsule at nighttime Patient taking differently: Take 50 mg by mouth at bedtime. Take with the 200 mg to equal 250 mg at bedtime 06/02/19  Yes Hickling, Princess Bruins, MD  VIMPAT 100 MG TABS TAKE 1  TABLET (100 MG) TOTAL BY MOUTH 2 (TWO) TIMES DAILY. Patient taking differently: Take 100 mg by mouth 2 (two) times daily.  06/06/19  Yes Hickling, Princess Bruins, MD  NAYZILAM 5 MG/0.1ML SOLN Take 5 mg after 2 minutes of seizures, may repeat in 10 minutes if seizures persist. Patient taking differently: Place 5 mg into the nose See admin instructions. Take 5 mg into the nose after 2 minutes of seizures, may repeat in 10 minutes if seizures persist. 06/02/19   Jodi Geralds, MD    Current Facility-Administered Medications  Medication Dose Route Frequency Provider Last Rate Last Dose  . 0.9 %  sodium chloride infusion   Intravenous Continuous Levin Erp, [place]        Allergies as of 06/28/2019 - Review Complete 06/24/2019  Allergen Reaction Noted  . Antihistamines, chlorpheniramine-type Other (See Comments) 04/21/2014  . Divalproex sodium Other (See Comments)   . Phenytoin Rash and Other (See Comments)   . Valproic acid and related Other (See Comments) 04/21/2014  . Vancomycin Rash, Other (See Comments), and Swelling 01/08/2012    Family History  Problem Relation Age of Onset  . Breast cancer Mother   . Colon polyps Mother   . Clotting disorder Mother   . Diabetes Father   . Hypertension Father   . Colon polyps Father   . Hyperlipidemia Father   . Coronary artery disease Maternal Grandfather   . Congestive Heart Failure Maternal Grandfather        Died at 4  . Pneumonia Maternal Grandfather   . Coronary artery disease Paternal Grandfather   . Lung cancer Paternal Grandfather        Died at 40  . Pneumonia Paternal Grandmother        Died at 29  . Stroke Paternal Grandmother     Social History   Socioeconomic History  . Marital status: Single    Spouse name: Not on file  . Number of children: 0  . Years of education: Not on file  . Highest education level: Not on file  Occupational History  . Occupation: Disabled    Fish farm manager: UNEMPLOYED  Social Needs  .  Financial resource strain: Not on file  . Food insecurity    Worry: Not on file    Inability: Not on file  . Transportation needs    Medical: Not on file    Non-medical: Not on file  Tobacco Use  . Smoking status: Never Smoker  . Smokeless tobacco: Never Used  Substance and Sexual Activity  . Alcohol use: No    Alcohol/week: 0.0 standard drinks  . Drug use: No  . Sexual activity: Never  Lifestyle  . Physical activity    Days per week: Not on file    Minutes per session: Not on file  . Stress: Not on file  Relationships  . Social Herbalist on phone: Not on file    Gets together: Not on file    Attends religious service: Not on file    Active member of club or organization: Not on file    Attends meetings of clubs or organizations: Not on file    Relationship status: Not on file  . Intimate partner violence    Fear of current or ex partner: Not on file    Emotionally abused: Not on file    Physically abused: Not on file    Forced sexual activity: Not on file  Other Topics Concern  . 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    He lives with his parents.    He enjoys helping around the house, Nascar, and football.    Review of Systems:  All other review of systems negative except as mentioned in the HPI.  Physical Exam: Vital signs in last 24 hours: Temp:  [97.5 F (36.4 C)] 97.5 F (36.4 C) (11/30 0809) Pulse Rate:  [64] 64 (11/30 0809) Resp:  [10] 10 (11/30 0809) BP: (108)/(64) 108/64 (11/30 0809) SpO2:  [99 %] 99 % (11/30 0809) Weight:  [56.7 kg] 56.7 kg (11/30 0809)   General:   Alert,  Well-developed, well-nourished, pleasant and cooperative in NAD  Lungs:  Clear throughout to auscultation.   Heart:  Regular rate and rhythm; no murmurs, clicks, rubs,  or gallops. Abdomen:  Soft, nontender and nondistended. Normal bowel sounds.   Neuro/Psych:   Alert and cooperative. Normal mood and affect. A and O x 3   K. Denzil Magnuson , MD [phone]

## 2019-08-22 NOTE — Transfer of Care (Signed)
Immediate Anesthesia Transfer of Care Note  Patient: [name]  Procedure(s) Performed: COLONOSCOPY WITH PROPOFOL (N/A ) POLYPECTOMY  Patient Location: PACU  Anesthesia Type:MAC  Level of Consciousness: awake, alert  and oriented  Airway & Oxygen Therapy: Patient Spontanous Breathing  Post-op Assessment: Report given to RN and Post -op Vital signs reviewed and stable  Post vital signs: Reviewed and stable  Last Vitals:  Vitals Value Taken Time  BP    Temp    Pulse 53 08/22/19 0931  Resp 11 08/22/19 0931  SpO2 92 % 08/22/19 0931  Vitals shown include unvalidated device data.  Last Pain:  Vitals:   08/22/19 0809  TempSrc: Oral         Complications: No apparent anesthesia complications

## 2019-08-22 NOTE — Op Note (Signed)
[facility] Patient Name: [name] Procedure Date: 08/22/2019 MRN: [id number] Attending MD: Mauri Pole , MD Date of Birth: January 15, 1986 CSN: [id number] Age: 33 Admit Type: Outpatient Procedure:                Colonoscopy Indications:              Evaluation of unexplained GI bleeding presenting                            with Hematochezia Providers:                Mauri Pole, MD, Glori Bickers, RN, Lina Sar, Technician, [facility], CRNA Referring MD:              Medicines:                Monitored Anesthesia Care Complications:            No immediate complications. Estimated Blood Loss:     Estimated blood loss was minimal. Procedure:                Pre-Anesthesia Assessment:                           - Prior to the procedure, a History and Physical                            was performed, and patient medications and                            allergies were reviewed. The patient's tolerance of                            previous anesthesia was also reviewed. The risks                            and benefits of the procedure and the sedation                            options and risks were discussed with the patient.                            All questions were answered, and informed consent                            was obtained. Prior Anticoagulants: The patient has                            taken no previous anticoagulant or antiplatelet                            agents. ASA Grade Assessment: II - A patient with  mild systemic disease. After reviewing the risks                            and benefits, the patient was deemed in                            satisfactory condition to undergo the procedure.                           After obtaining informed consent, the colonoscope                            was passed under direct vision. Throughout the        procedure, the patient's blood pressure, pulse, and                            oxygen saturations were monitored continuously. The                            [id number] Olympus pediatric colonscope                            was introduced through the anus and advanced to the                            the terminal ileum, with identification of the                            appendiceal orifice and IC valve. The colonoscopy                            was performed without difficulty. The patient                            tolerated the procedure well. The quality of the                            bowel preparation was excellent. The terminal                            ileum, ileocecal valve, appendiceal orifice, and                            rectum were photographed. Scope In: 9:01:18 AM Scope Out: 9:22:01 AM Scope Withdrawal Time: 0 hours 18 minutes 4 seconds  Total Procedure Duration: 0 hours 20 minutes 43 seconds  Findings:      The perianal and digital rectal examinations were normal.      The terminal ileum appeared normal.      A 5 mm polyp was found in the transverse colon. The polyp was sessile.       The polyp was removed with a cold snare. Resection and retrieval were       complete.      A 2 mm polyp was found in the transverse colon. The polyp  was sessile.       The polyp was removed with a cold biopsy forceps. Resection and       retrieval were complete.      A 30 mm polyp was found in the sigmoid colon. The polyp was       multi-lobulated and pedunculated. The polyp was removed with a hot       snare. Resection and retrieval were complete. For prophylactic       hemostasis, two hemostatic clips were successfully placed (MR       conditional). There was no bleeding at the end of the procedure.      A patchy area of mildly erythematous mucosa was found in the rectum.      The exam was otherwise without abnormality. Impression:               - The examined  portion of the ileum was normal.                           - One 5 mm polyp in the transverse colon, removed                            with a cold snare. Resected and retrieved.                           - One 2 mm polyp in the transverse colon, removed                            with a cold biopsy forceps. Resected and retrieved.                           - One 30 mm polyp in the sigmoid colon, removed                            with a hot snare. Resected and retrieved. Clips (MR                            conditional) were placed.                           - Erythematous mucosa in the rectum.                           - The examination was otherwise normal. Moderate Sedation:      N/A Recommendation:           - Patient has a contact number available for                            emergencies. The signs and symptoms of potential                            delayed complications were discussed with the                            patient. Return to normal activities tomorrow.  Written discharge instructions were provided to the                            patient.                           - Resume previous diet.                           - Continue present medications.                           - Await pathology results.                           - No ibuprofen, naproxen, or other non-steroidal                            anti-inflammatory drugs for 2 weeks.                           - Repeat colonoscopy in 3 years for surveillance                            based on pathology results. Procedure Code(s):        --- Professional ---                           [phone], Colonoscopy, flexible; with removal of                            tumor(s), polyp(s), or other lesion(s) by snare                            technique                           [id number], 37, Colonoscopy, flexible; with biopsy,                            single or multiple Diagnosis Code(s):        ---  Professional ---                           K63.5, Polyp of colon                           K62.89, Other specified diseases of anus and rectum                           K92.1, Melena (includes Hematochezia) CPT copyright 2019 American Medical Association. All rights reserved. The codes documented in this report are preliminary and upon coder review may  be revised to meet current compliance requirements. Mauri Pole, MD 08/22/2019 9:35:35 AM This report has been signed electronically. Number of Addenda: 0

## 2019-08-22 NOTE — Anesthesia Postprocedure Evaluation (Signed)
Anesthesia Post Note  Patient: [name]  Procedure(s) Performed: COLONOSCOPY WITH PROPOFOL (N/A ) POLYPECTOMY HEMOSTASIS CLIP PLACEMENT     Patient location during evaluation: PACU Anesthesia Type: MAC Level of consciousness: awake and patient cooperative (At baseline) Pain management: pain level controlled Vital Signs Assessment: post-procedure vital signs reviewed and stable Respiratory status: spontaneous breathing, nonlabored ventilation and respiratory function stable Cardiovascular status: stable and blood pressure returned to baseline Anesthetic complications: no    Last Vitals:  Vitals:   08/22/19 0930 08/22/19 0948  BP: 106/73 114/87  Pulse: (!) 53 67  Resp: 11 (!) 22  Temp: (!) 36.4 C   SpO2: 92% 100%    Last Pain:  Vitals:   08/22/19 0948  TempSrc:   PainSc: 0-No pain                 Audry Pili

## 2019-08-23 ENCOUNTER — Encounter: Payer: Self-pay | Admitting: Gastroenterology

## 2019-08-23 ENCOUNTER — Encounter (HOSPITAL_COMMUNITY): Payer: Self-pay | Admitting: Gastroenterology

## 2019-08-23 LAB — SURGICAL PATHOLOGY

## 2019-09-01 ENCOUNTER — Ambulatory Visit (INDEPENDENT_AMBULATORY_CARE_PROVIDER_SITE_OTHER): Payer: Medicaid Other | Admitting: Pediatrics

## 2019-09-01 ENCOUNTER — Encounter (INDEPENDENT_AMBULATORY_CARE_PROVIDER_SITE_OTHER): Payer: Self-pay | Admitting: Pediatrics

## 2019-09-01 ENCOUNTER — Other Ambulatory Visit: Payer: Self-pay

## 2019-09-01 ENCOUNTER — Other Ambulatory Visit (INDEPENDENT_AMBULATORY_CARE_PROVIDER_SITE_OTHER): Payer: Self-pay | Admitting: Pediatrics

## 2019-09-01 DIAGNOSIS — I059 Rheumatic mitral valve disease, unspecified: Secondary | ICD-10-CM

## 2019-09-01 DIAGNOSIS — G40119 Localization-related (focal) (partial) symptomatic epilepsy and epileptic syndromes with simple partial seizures, intractable, without status epilepticus: Secondary | ICD-10-CM

## 2019-09-01 DIAGNOSIS — G40319 Generalized idiopathic epilepsy and epileptic syndromes, intractable, without status epilepticus: Secondary | ICD-10-CM | POA: Diagnosis not present

## 2019-09-01 DIAGNOSIS — G43009 Migraine without aura, not intractable, without status migrainosus: Secondary | ICD-10-CM | POA: Diagnosis not present

## 2019-09-01 DIAGNOSIS — E876 Hypokalemia: Secondary | ICD-10-CM

## 2019-09-01 DIAGNOSIS — F70 Mild intellectual disabilities: Secondary | ICD-10-CM | POA: Diagnosis not present

## 2019-09-01 MED ORDER — ONFI 10 MG PO TABS: ORAL_TABLET | ORAL | 5 refills | Status: DC

## 2019-09-01 MED ORDER — K-PHOS 500 MG PO TABS: ORAL_TABLET | ORAL | 5 refills | Status: DC

## 2019-09-01 MED ORDER — CARBAMAZEPINE 100 MG PO CHEW: CHEWABLE_TABLET | ORAL | 5 refills | Status: DC

## 2019-09-01 MED ORDER — TROKENDI XR 50 MG PO CP24: ORAL_CAPSULE | ORAL | 5 refills | Status: DC

## 2019-09-01 NOTE — Progress Notes (Signed)
This is a Pediatric Specialist E-Visit follow up consult provided WebEx [name] and their parent/guardian [name] consented to an E-Visit consult today.  Location of patient: [name] is at home Location of provider: Wyline Copas, MD is in office Patient was referred by Marcus Noon, MD   The following participants were involved in this E-Visit: mom, patient, [place] provider  Chief Complaint/ Reason for E-Visit today: Epilepsy Total time on call: 15 minutes Follow up: 1 month    Patient: [name] MRN: [id number] Sex: male DOB: Jan 13, 1986  Provider: Wyline Copas, MD Location of Care: [facility]  Note type: Routine return visit  History of Present Illness: Referral Source: Marcus Pall, MD History from: mother, patient and [facility] chart Chief Complaint: Epilepsy  [name] is a 33 y.o. 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  He was evaluated September 01, 2019 for the first time since June 02, 2019.  [name] had a migraine today and was asleep.  That reason I did not examine him but I did talk to his mother at length.  Recently he had significant GI bleeding that came from a large colonic polyp that was removed.  He experiences about 1 migraine per month.  He has 3 or 4 generalized tonic-clonic seizures a month although some of the seizures are staring spells that likely represent complex partial seizures.  Medications swiping his vagal nerve stimulator reports that seizure at other times the seizure persists.  They rarely go on longer than a couple of minutes although on occasion in the past they have.  [name]'s father has a viral syndrome and is being tested for Covid today.  That plus his migraine is the reason that we thought it best for a virtual visit to take place.  His mother told me that he needed carbamazepine, [name],  potassium phosphate, and Trokendi 50 mg tablets.  He also turned out that he needed to Vimpat.  Review of Systems: A complete review of systems was remarkable for patient is here to be seen for epilepsy. Mom reports that he has had three seizures this month. The last seizure was on Monday , November 7th. She reports that he has a migraine this morning that includes vomiting. She states the his headaches are still the same from his last visit. No other concerns at this time., all other systems reviewed and negative.  Past Medical History Diagnosis Date  . ALLERGIC RHINITIS 12/26/2009  . Blood clot in vein 2015  . Brown's syndrome   . Complication of anesthesia    seizure after pac surgery at [facility] 2013 had to be intubated, no other anes issurs  . Growth disorder   . Headache    migraines  . Heart murmur    moderate MR ses dr Darcus Pester  . Hyperlipidemia    No therapy  . Hypokalemia   . Mental retardation, mild (I.Q. 50-70)   . MITRAL VALVE PROLAPSE 06/22/2007  . SEIZURE DISORDER 06/22/2007   last seizure 08-02-2019  . Sleep apnea    cpap , last sleep study 10/01/11  . Unspecified hypothyroidism 06/22/2007   Hospitalizations: No., Head Injury: No., Nervous System Infections: No., Immunizations up to date: Yes.    Copied from prior chart Diagnosis of developmental delay as a toddler.   EEG 11/07/86 was normal for gestational age.   Genetics evaluation short stature, prenatal onset, chromosomal study: 60 XY, fragile X syndrome  negative, evaluation for MELAS and MERRF were negative.  Initial seizure 08/28/94 left locomotor with secondary generalization.   MRI of the brain 09/13/93: Scattered subcortical white matter lesions at the parietotemporal parieto-occipital junction is ischemic versus hamartomas.   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   Diagnosis attention deficit disorder inattentive type treated  without success with Cylert April 1995  MRI brain 04/04/94 stable differential diagnosis hamartoma, vasculitis, ischemic, or embolic phenomenon.   Status epilepticus 01/31/95, and 04/15/95 Neurontin added to Tegretol   MRI brain 06/14/96 unchanged   Admission to [facility]. Tegretol 30 mcg for militate. 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   Topiramate started in May 1999 unable to be tolerated with Tegretol and was tapered and discontinued.  January 2000 EEG showed right greater than left mid temporal sharp waves was otherwise well-organized EEG. He hospitalizations in March, July, October, in November for recurrent seizures.  Patient placed on Depakote in April 2000 and developed gagging abdominal pain headache and had significant change in transaminases and liver functions. Topamax was restarted and Keppra was added.  Vimpat was started July 16, 2012 and adjusted upwards.   [name] was added on November 18, 2012 and has been adjusted upwards. Topamax was changed to Trokendi XR in attempt to deal with sleepiness, unsteadiness caused by polypharmacy. We to these medications have been introduced, the patient experiences somewhat improved seizure control however Is quite likely that despite better seizure control, the patient is experiencing impairment in cognition and gait as a result of polypharmacy .  MRI of the brain on March 21, 2013. The patient has extensive calcification to the basal ganglia bilaterally, normal ventricle size, and no acute findings. EKG was unchanged  Birth History He was a breech presentation,  cesarean section delivery. He had intrauterine growth retardation, and failure to thrive.  Behavior History Mild intellectual disability  Surgical History Procedure Laterality Date  . ANKLE SURGERY Left    fused does not bend, wears right leg brace  . BIOPSY  08/22/2019   Procedure: BIOPSY;  Surgeon: Mauri Pole, MD;  Location: [facility];  Service: Endoscopy;;  . COLONOSCOPY WITH PROPOFOL N/A 08/22/2019   Procedure: COLONOSCOPY WITH PROPOFOL;  Surgeon: Mauri Pole, MD;  Location: [facility];  Service: Endoscopy;  Laterality: N/A;  . EYE SURGERY     X2 for Brown Syndrome  . HEMOSTASIS CLIP PLACEMENT  08/22/2019   Procedure: HEMOSTASIS CLIP PLACEMENT;  Surgeon: Mauri Pole, MD;  Location: [facility];  Service: Endoscopy;;  . IMPLANTATION VAGAL NERVE STIMULATOR     Left leaves on for surgeries  . POLYPECTOMY  08/22/2019   Procedure: POLYPECTOMY;  Surgeon: Mauri Pole, MD;  Location: [facility];  Service: Endoscopy;;  . PORTACATH PLACEMENT  2013 and removed and new placed   right chest   Family History family history includes Breast cancer in his mother; Clotting disorder in his mother; Colon polyps in his father and mother; Congestive Heart Failure in his maternal grandfather; Coronary artery disease in his maternal grandfather and paternal grandfather; Diabetes in his father; Hyperlipidemia in his father; Hypertension in  his father; Lung cancer in his paternal grandfather; Pneumonia in his maternal grandfather and paternal grandmother; Stroke in his paternal grandmother. Family history is negative for migraines, seizures, intellectual disabilities, blindness, deafness, birth defects, chromosomal disorder, or autism.  Social History Socioeconomic History  . Marital status: Single  . Years of education:  65  . Highest education level:  High school certificate  Occupational History  . Occupation: Disabled    Employer: UNEMPLOYED  Tobacco Use   . Smoking status: Never Smoker  . Smokeless tobacco: Never Used  Substance and Sexual Activity  . Alcohol use: No    Alcohol/week: 0.0 standard drinks  . Drug use: No  . 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     He lives with his parents.     He enjoys helping around the house, Nascar, and football.   Allergies Allergen Reactions  . Antihistamines, Chlorpheniramine-Type Other (See Comments)    Other Reaction: Dimetapp causes seizures  . Divalproex Sodium Other (See Comments)    Nausea, vomiting, liver and kidney dysfunction  . Phenytoin Rash and Other (See Comments)    Dilantin causes more seizures.   . Valproic Acid And Related Other (See Comments)    Shuts down systems  . Vancomycin Rash, Other (See Comments) and Swelling    If given too fast, causes red man reaction Other Reaction: Redman's Syndrome   Physical Exam There were no vitals taken for this visit.  I did not examine him today.  Assessment 1.  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 2.  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 3.  Hypokalemia, E87.6. 4.  Mild intellectual disability, F70 5.  Migraine without aura without status migrainosus, not intractable, G 43.009.  Discussion [name] is medically and neurologically stable.  I was not able to assess him today because of his headache.  For that reason I want to see him in a month so that I can assess him in person and interrogate his vagal nerve stimulator.  Plan I refilled prescriptions for carbamazepine, [name], potassium phosphate, Trokendi 50 mg, and Vimpat the antiepileptic drugs that are capitalized are trade drug.  Greater than 50% of a 15-minute visit was spent in counseling and coordination of care.  This actually was all that was done because I could not examine him.  He will return in a month  for examination and interrogation of his VNS.   Medication List   Accurate as of September 01, 2019 12:13 PM. If you have any questions, ask your nurse or doctor.    acetaminophen 500 MG tablet Commonly known as: TYLENOL Take 1,000 mg by mouth every 6 (six) hours as needed for headache.   albuterol 1.25 MG/3ML nebulizer solution Commonly known as: ACCUNEB Take 1 ampule by nebulization every 4 (four) hours as needed for wheezing.   carbamazepine 100 MG chewable tablet Commonly known as: TEGRETOL Chew 2 tablets in the morning, 2 tablets at midday and 1+1/2 tablets in the evening What changed:   how much to take  how to take this  when to take this  additional instructions   cetirizine 10 MG tablet Commonly known as: ZYRTEC Take 10 mg by mouth daily.   Dextromethorphan-guaiFENesin 5-100 MG/5ML Liqd Take 10 mLs by mouth 2 (two) times daily as needed (cough).   diazepam 2 MG tablet Commonly known as:  Valium Take 1 tablet every 6 - 8 hours as needed for anxiety or seizures What changed:   how much to take  how to take this  when to take this  reasons to take this  additional instructions   Flaxseed Oil 1000 MG Caps Take 1,000 mg by mouth daily.   guaiFENesin 600 MG 12 hr tablet Commonly known as: MUCINEX Take 600 mg by mouth 2 (two) times daily.   hydrocortisone 25 MG suppository Commonly known as: ANUSOL-HC Place 1 suppository (25 mg total) rectally at bedtime. What changed:   when to take this  reasons to take this   levETIRAcetam 500 MG tablet Commonly known as: KEPPRA TAKE 2 1/2 TABLETS BY MOUTH TWICE PER DAY What changed: See the new instructions.   levothyroxine 25 MCG tablet Commonly known as: SYNTHROID Take 25 mcg by mouth daily before breakfast.   LORazepam 2 MG/ML concentrated solution Commonly known as: ATIVAN Withdraw 29ml (2mg ) and 51ml  Normal saline into 3 ml syringe. Administer IV push over 3-5 minutes as needed for recurrent seizures    montelukast 10 MG tablet Commonly known as: SINGULAIR Take 10 mg by mouth at bedtime.   multivitamin with minerals Tabs tablet Take 1 tablet by mouth daily.   Nayzilam 5 MG/0.1ML Soln Generic drug: Midazolam Take 5 mg after 2 minutes of seizures, may repeat in 10 minutes if seizures persist. What changed:   how much to take  how to take this  when to take this  additional instructions   Omega-3 500 MG Caps Take 500 mg by mouth daily.   ondansetron 4 MG disintegrating tablet Commonly known as: ZOFRAN-ODT Take 1 tablet as needed for nausea and vomiting What changed:   how much to take  how to take this  when to take this  reasons to take this  additional instructions   [name] 10 MG tablet Generic drug: cloBAZam TAKE 1 TABLET BY MOUTH IN THE MORNING, 1 TABLET IN THE AFTERNOON, AND 2 TABLETS AT BEDTIME What changed:   how much to take  how to take this  when to take this  additional instructions   PHENObarbital 65 MG/ML injection Commonly known as: LUMINAL Withdraw 51ml (65mg ) Phenobarbital and 30ml Normal Saline into 49ml syringe. Adminster IV push for 3-5 minutes as needed for recurrent seizures   potassium chloride SA 20 MEQ tablet Commonly known as: KLOR-CON Take 1 tablet (20 mEq total) by mouth daily.   potassium phosphate (monobasic) 500 MG tablet Commonly known as: K-Phos TAKE 1 TABLET (500MG ) BY MOUTH FOUR TIMES A DAY What changed:   how much to take  how to take this  when to take this  additional instructions   promethazine 12.5 MG suppository Commonly known as: PHENERGAN Place 12.5 mg rectally every 6 (six) hours as needed for nausea/vomiting.   rosuvastatin 10 MG tablet Commonly known as: CRESTOR Take 10 mg by mouth at bedtime.   Trokendi XR 50 MG Cp24 Generic drug: Topiramate ER Take 1 capsule at nighttime What changed:   how much to take  how to take this  when to take this  additional instructions   Trokendi XR 200  MG Cp24 Generic drug: Topiramate ER Take 200 mg by mouth at bedtime. Take 1 capsule (200mg )  by mouth at bedtime What changed: additional instructions   Vimpat 100 MG Tabs Generic drug: Lacosamide TAKE 1 TABLET (100 MG) TOTAL BY MOUTH 2 (TWO) TIMES DAILY. What changed: See the new instructions.  The medication list was reviewed and reconciled. All changes or newly prescribed medications were explained.  A complete medication list was provided to the patient/caregiver.  Jodi Geralds MD

## 2019-09-01 NOTE — Patient Instructions (Signed)
I am sorry the [name] was not feeling well today with a migraine and that time he has a bad virus.  We can hope that it is not Covid.  Please let me know if it is.  3 of the prescriptions will need to be sent to the pharmacy.  [name] actually was a trade drug, as is Trokendi, as is Vimpat.  Please set up an appointment for January so that we can check the vagal nerve stimulator.

## 2019-09-01 NOTE — Telephone Encounter (Signed)
Please send to the pharmacy °

## 2019-09-09 ENCOUNTER — Other Ambulatory Visit (INDEPENDENT_AMBULATORY_CARE_PROVIDER_SITE_OTHER): Payer: Self-pay | Admitting: Pediatrics

## 2019-09-09 DIAGNOSIS — G40119 Localization-related (focal) (partial) symptomatic epilepsy and epileptic syndromes with simple partial seizures, intractable, without status epilepticus: Secondary | ICD-10-CM

## 2019-09-09 DIAGNOSIS — G40319 Generalized idiopathic epilepsy and epileptic syndromes, intractable, without status epilepticus: Secondary | ICD-10-CM

## 2019-09-11 ENCOUNTER — Other Ambulatory Visit (INDEPENDENT_AMBULATORY_CARE_PROVIDER_SITE_OTHER): Payer: Self-pay | Admitting: Pediatrics

## 2019-09-11 DIAGNOSIS — G40119 Localization-related (focal) (partial) symptomatic epilepsy and epileptic syndromes with simple partial seizures, intractable, without status epilepticus: Secondary | ICD-10-CM

## 2019-09-11 DIAGNOSIS — G40319 Generalized idiopathic epilepsy and epileptic syndromes, intractable, without status epilepticus: Secondary | ICD-10-CM

## 2019-09-17 DIAGNOSIS — K625 Hemorrhage of anus and rectum: Secondary | ICD-10-CM

## 2019-09-17 DIAGNOSIS — D123 Benign neoplasm of transverse colon: Secondary | ICD-10-CM

## 2019-09-19 ENCOUNTER — Other Ambulatory Visit (INDEPENDENT_AMBULATORY_CARE_PROVIDER_SITE_OTHER): Payer: Self-pay | Admitting: Pediatrics

## 2019-09-19 DIAGNOSIS — G40319 Generalized idiopathic epilepsy and epileptic syndromes, intractable, without status epilepticus: Secondary | ICD-10-CM

## 2019-09-20 NOTE — Telephone Encounter (Signed)
Please send to the pharmacy °

## 2019-09-22 ENCOUNTER — Telehealth (INDEPENDENT_AMBULATORY_CARE_PROVIDER_SITE_OTHER): Payer: Self-pay | Admitting: Pediatrics

## 2019-09-22 DIAGNOSIS — G40319 Generalized idiopathic epilepsy and epileptic syndromes, intractable, without status epilepticus: Secondary | ICD-10-CM

## 2019-09-22 MED ORDER — ONFI 10 MG PO TABS: ORAL_TABLET | ORAL | 5 refills | Status: DC

## 2019-09-22 NOTE — Telephone Encounter (Signed)
New rx printed and will have Dr. Rogers Blocker sign.

## 2019-09-22 NOTE — Telephone Encounter (Signed)
  Who's calling (name and relationship to patient) : Stanton Kidney (Pharm)  Best contact number: [phone] Provider they see: Dr. Gaynell Face Reason for call: Pharm stated Dr. Gaynell Face put DAW1 on the rx request for pt's [name]. Pharm stated that it needs to have brand name medically necessary written on rx or faxed in to the pharm for them to be able to process it.      PRESCRIPTION REFILL ONLY  Name of prescription: [place]: [facility]

## 2019-09-22 NOTE — Telephone Encounter (Signed)
Rx faxed and confirmed.

## 2019-10-03 ENCOUNTER — Telehealth (INDEPENDENT_AMBULATORY_CARE_PROVIDER_SITE_OTHER): Payer: Self-pay | Admitting: Family

## 2019-10-03 NOTE — Telephone Encounter (Signed)
  Who's calling (name and relationship to patient) : [name] (patient)  Best contact number:  [phone]  Provider they see: Cloretta [name]   Reason for call: Patient called stated that he need an VNS appt with [name] in January per Dr Gara Kroner.    PRESCRIPTION REFILL ONLY  Name of prescription:  Pharmacy:

## 2019-10-03 NOTE — Telephone Encounter (Signed)
Yes I let mom know on the voicemail

## 2019-10-03 NOTE — Telephone Encounter (Signed)
L/M inviting mom to call back to schedule [name]'s 1 month appointment with Dr. Gaynell Face. It is not specified whether or not he is supposed to see Otila Kluver.

## 2019-10-03 NOTE — Telephone Encounter (Signed)
He only needs to see Dr Gaynell Face for VNS, not me. Thanks, Otila Kluver

## 2019-10-10 ENCOUNTER — Other Ambulatory Visit: Payer: Self-pay

## 2019-10-10 ENCOUNTER — Encounter (INDEPENDENT_AMBULATORY_CARE_PROVIDER_SITE_OTHER): Payer: Self-pay | Admitting: Pediatrics

## 2019-10-10 ENCOUNTER — Ambulatory Visit (INDEPENDENT_AMBULATORY_CARE_PROVIDER_SITE_OTHER): Payer: Medicaid Other | Admitting: Pediatrics

## 2019-10-10 VITALS — BP 92/60 | HR 64 | Ht 62.0 in | Wt 122.4 lb

## 2019-10-10 DIAGNOSIS — G40119 Localization-related (focal) (partial) symptomatic epilepsy and epileptic syndromes with simple partial seizures, intractable, without status epilepticus: Secondary | ICD-10-CM | POA: Diagnosis not present

## 2019-10-10 DIAGNOSIS — R2689 Other abnormalities of gait and mobility: Secondary | ICD-10-CM

## 2019-10-10 DIAGNOSIS — G40319 Generalized idiopathic epilepsy and epileptic syndromes, intractable, without status epilepticus: Secondary | ICD-10-CM

## 2019-10-10 DIAGNOSIS — E876 Hypokalemia: Secondary | ICD-10-CM

## 2019-10-10 DIAGNOSIS — G43009 Migraine without aura, not intractable, without status migrainosus: Secondary | ICD-10-CM | POA: Diagnosis not present

## 2019-10-10 DIAGNOSIS — F70 Mild intellectual disabilities: Secondary | ICD-10-CM

## 2019-10-10 MED ORDER — TROKENDI XR 50 MG PO CP24: ORAL_CAPSULE | ORAL | 5 refills | Status: DC

## 2019-10-10 MED ORDER — K-PHOS 500 MG PO TABS: ORAL_TABLET | ORAL | 5 refills | Status: DC

## 2019-10-10 MED ORDER — TROKENDI XR 200 MG PO CP24: 200.0000 mg | ORAL_CAPSULE | Freq: Every day | ORAL | 5 refills | Status: DC

## 2019-10-10 MED ORDER — LEVETIRACETAM 500 MG PO TABS: ORAL_TABLET | ORAL | 5 refills | Status: DC

## 2019-10-10 NOTE — Patient Instructions (Signed)
I am glad that you did not get overly sick.  I refilled the prescriptions are requested.  We will plan to see you in 3 months.

## 2019-10-10 NOTE — Progress Notes (Signed)
Patient: [name] MRN: [id number] Sex: male DOB: 1986-01-20  Provider: Wyline Copas, MD Location of Care: [facility]  Note type: Routine return visit and interrogation of vagal nerve stimulator  History of Present Illness: Referral Source: Anastasia Pall, MD History from: mother, patient and [facility] chart Chief Complaint: Epilepsy  [name] is a 34 y.o. male who returns October 10, 2019 for the first time since September 01, 2019.  In the interim since [name] was seen, closed parents and sister contracted virus.  He was the only one who is not symptomatic.  His father was in the hospital for 7 days his mother was able to stay at home but was sick his sister's had a marked exacerbation of her migraines.  [name] also had an injury where he fell into a piece of furniture and fractured his right humerus above the elbow.  This happened on Christmas night.  He was taken to [facility] urgent care the next day.  [name] had 3 seizures since his last visit: a complex partial seizure on December 21 and generalized tonic-clonic seizures on January 4 and January 16.  In addition he had 2 migraines on December 18th and 22nd.  His general health is good and unchanged.  Multiple antiepileptic medications which seem to be necessary to bring about the degree of seizure control that we currently have he also has a functioning vagal nerve stimulator that was interrogated today.  Procedure: Interrogation and reprogramming of Vagal Nerve Stimulator  Implantation of the current stimulator 104 serial # [id number] implanted 04/19/14   Interrogation of the vagal nerve stimulator: Voltage 2.25 mA. Frequency 20 Hz, pulse width 250 microseconds, signal time on30seconds, signal time off 1.1 minutes;magnet current 2.50 mA, magnet signal time on 60 seconds, magnet pulse width 250 microseconds.  The normal mode diagnostics using his current settings revealed the  battery is50%full, and communication, output, and impedance are good; 1699ohms. There is no indication change the battery. There have been 1924 magnet activations since implantationup until November 29, 2018.  I did not record the magnet activations today.  Review of Systems: A complete review of systems was remarkable for patient is here to be seen for epilepsy. Mom reports that the patient has had three seizures since last visit. She reports that he also broke his arm on Christmas day due to tripping. She has no concerns at this time., all other systems reviewed and negative.  Past Medical History Diagnosis Date  . ALLERGIC RHINITIS 12/26/2009  . Blood clot in vein 2015  . Brown's syndrome   . Complication of anesthesia    seizure after pac surgery at [facility] 2013 had to be intubated, no other anes issurs  . Growth disorder   . Headache    migraines  . Heart murmur    moderate MR ses dr Darcus Pester  . Hyperlipidemia    No therapy  . Hypokalemia   . Mental retardation, mild (I.Q. 50-70)   . MITRAL VALVE PROLAPSE 06/22/2007  . SEIZURE DISORDER 06/22/2007   last seizure 08-02-2019  . Sleep apnea    cpap , last sleep study 10/01/11  . Unspecified hypothyroidism 06/22/2007   Hospitalizations: No., Head Injury: No., Nervous System Infections: No., Immunizations up to date: Yes.    Copied from prior chart Diagnosis of developmental delay as a toddler.   EEG 11/07/86 was normal for gestational age.   Genetics evaluation short stature, prenatal onset, chromosomal study: 21 XY, fragile X syndrome  negative, evaluation for MELAS and MERRF were negative.  Initial seizure 08/28/94 left locomotor with secondary generalization.   MRI of the brain 09/13/93: Scattered subcortical white matter lesions at the parietotemporal parieto-occipital junction is ischemic versus hamartomas.   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     Diagnosis attention deficit disorder inattentive type treated without success with Cylert April 1995  MRI brain 04/04/94 stable differential diagnosis hamartoma, vasculitis, ischemic, or embolic phenomenon.   Status epilepticus 01/31/95, and 04/15/95 Neurontin added to Tegretol   MRI brain 06/14/96 unchanged   Admission to [facility]. Tegretol 30 mcg for militate. 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   Topiramate started in May 1999 unable to be tolerated with Tegretol and was tapered and discontinued.  January 2000 EEG showed right greater than left mid temporal sharp waves was otherwise well-organized EEG. He hospitalizations in March, July, October, in November for recurrent seizures.  Patient placed on Depakote in April 2000 and developed gagging abdominal pain headache and had significant change in transaminases and liver functions. Topamax was restarted and Keppra was added.  Vimpat was started July 16, 2012 and adjusted upwards.   [name] was added on November 18, 2012 and has been adjusted upwards. Topamax was changed to Trokendi XR in attempt to deal with sleepiness, unsteadiness caused by polypharmacy. We to these medications have been introduced, the patient experiences somewhat improved seizure control however Is quite likely that despite better seizure control, the patient is experiencing impairment in cognition and gait as a result of polypharmacy .  MRI of the brain on March 21, 2013. The patient has extensive calcification to the basal ganglia bilaterally, normal ventricle size, and no acute findings.  EKG was unchanged  Birth History He was a breech presentation, cesarean section delivery. He had intrauterine growth retardation, and failure to thrive.  Behavior History Mild intellectual disability  Surgical History Procedure Laterality Date  . ANKLE SURGERY Left    fused does not bend, wears right leg brace  . BIOPSY  08/22/2019   Procedure: BIOPSY;  Surgeon: Mauri Pole, MD;  Location: [facility];  Service: Endoscopy;;  . COLONOSCOPY WITH PROPOFOL N/A 08/22/2019   Procedure: COLONOSCOPY WITH PROPOFOL;  Surgeon: Mauri Pole, MD;  Location: [facility];  Service: Endoscopy;  Laterality: N/A;  . EYE SURGERY     X2 for Brown Syndrome  . HEMOSTASIS CLIP PLACEMENT  08/22/2019   Procedure: HEMOSTASIS CLIP PLACEMENT;  Surgeon: Mauri Pole, MD;  Location: [facility];  Service: Endoscopy;;  . IMPLANTATION VAGAL NERVE STIMULATOR     Left leaves on for surgeries  . POLYPECTOMY  08/22/2019   Procedure: POLYPECTOMY;  Surgeon: Mauri Pole, MD;  Location: [facility];  Service: Endoscopy;;  . PORTACATH PLACEMENT  2013 and removed and new placed   right chest   Family History family history includes Breast cancer in his mother; Clotting disorder in his mother; Colon polyps in his father and mother; Congestive Heart Failure in his maternal grandfather; Coronary artery disease in his maternal grandfather and paternal grandfather; Diabetes in his father; Hyperlipidemia in his father; Hypertension  in his father; Lung cancer in his paternal grandfather; Pneumonia in his maternal grandfather and paternal grandmother; Stroke in his paternal grandmother. Family history is negative for migraines, seizures, intellectual disabilities, blindness, deafness, birth defects, chromosomal disorder, or autism.  Social History Socioeconomic History  . Marital status: Single  . Years of education:  22  . Highest education level:  High school certificate  Occupational History   . Occupation: Disabled    Employer: UNEMPLOYED  Tobacco Use  . Smoking status: Never Smoker  . Smokeless tobacco: Never Used  Substance and Sexual Activity  . Alcohol use: No    Alcohol/week: 0.0 standard drinks  . Drug use: No  . Sexual activity: Never  Other Topics Concern  . 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    He lives with his parents.    He enjoys helping around the house, Nascar, and football.   Allergies Allergen Reactions  . Antihistamines, Chlorpheniramine-Type Other (See Comments)    Other Reaction: Dimetapp causes seizures  . Divalproex Sodium Other (See Comments)    Nausea, vomiting, liver and kidney dysfunction  . Phenytoin Rash and Other (See Comments)    Dilantin causes more seizures.   . Valproic Acid And Related Other (See Comments)    Shuts down systems  . Vancomycin Rash, Other (See Comments) and Swelling    If given too fast, causes red man reaction Other Reaction: Redman's Syndrome   Physical Exam BP 92/60   Pulse 64   Ht 5\' 2"  (1.575 m)   Wt 122 lb 6.4 oz (55.5 kg)   BMI 22.39 kg/m   General: alert, well developed, well nourished, in no acute distress, brown hair, hazel eyes, right handed Head: normocephalic, no dysmorphic features Ears, Nose and Throat: Otoscopic: tympanic membranes normal; pharynx: oropharynx is pink without exudates or tonsillar hypertrophy Neck: supple, diminished range of motion, no cranial or cervical bruits, head is tilted with the ear toward the right shoulder and his chin to the left, appears to be a mild torticollis Respiratory: auscultation clear Cardiovascular: no murmurs, pulses are normal Musculoskeletal: no skeletal deformities or apparent scoliosis Skin: no rashes or neurocutaneous lesions  Neurologic Exam  Mental Status: alert; oriented to person, place; knowledge is below normal for age; language is  normal for conversation, speech is dysarthric but intelligible Cranial Nerves: visual fields are full to double simultaneous stimuli; extraocular movements are full and conjugate; pupils are round reactive to light; funduscopic examination shows sharp disc margins with normal vessels; symmetric facial strength; midline tongue and uvula; air conduction is greater than bone conduction bilaterally Motor: Mild proximal weakness that is symmetric, normal tone and mass; clumsy fine motor movements; no pronator drift Sensory: intact responses to cold, vibration, proprioception and stereognosis Coordination: good finger-to-nose, clumsy rapid repetitive alternating movements and finger apposition Gait and Station: Wide-based, antalgic, diplegic gait and station: patient is able to walk on heels, toes and tandem without difficulty; balance is poor on one foot; Romberg exam is negative Reflexes: symmetric and diminished bilaterally; no clonus; bilateral flexor plantar responses  Assessment 1.  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 2.  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 3.  Mild intellectual disability, F70. 4.  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  Migraine without aura without status migrainosus, not intractable, G43.009. 6.  Hypokalemia, E87.6.  Discussion [name] is doing well  all things considered.  I am so happy that he was asymptomatic.  I do not think he would have handled it well if he had been very sick.  There is no reason to change his current treatments.  Plan He will return to see me in 3 months.  I refilled prescriptions for Trokendi 50 and 200 mg, potassium phosphate, and levetiracetam.  Greater than 50% of a 25-minute visit was spent in counseling and coordination of care concerning his seizures, headaches, and discussing long-term effects of coronavirus.  His vagal nerve stimulator continues to work well.   Medication List   Accurate  as of October 10, 2019 11:59 PM. If you have any questions, ask your nurse or doctor.    acetaminophen 500 MG tablet Commonly known as: TYLENOL Take 1,000 mg by mouth every 6 (six) hours as needed for headache.   albuterol 1.25 MG/3ML nebulizer solution Commonly known as: ACCUNEB Take 1 ampule by nebulization every 4 (four) hours as needed for wheezing.   carbamazepine 100 MG chewable tablet Commonly known as: TEGRETOL CHEW 2 TABLETS IN THE MORNING, 2 TABLETS AT MIDDAY AND 1 & 1/2 TABLETS IN THE EVENING   cetirizine 10 MG tablet Commonly known as: ZYRTEC Take 10 mg by mouth daily.   Dextromethorphan-guaiFENesin 5-100 MG/5ML Liqd Take 10 mLs by mouth 2 (two) times daily as needed (cough).   diazepam 2 MG tablet Commonly known as: Valium Take 1 tablet every 6 - 8 hours as needed for anxiety or seizures What changed:   how much to take  how to take this  when to take this  reasons to take this  additional instructions   Fish Oil-Vitamin D 1000-1000 MG-UNIT Caps Take by mouth.   Flaxseed Oil 1000 MG Caps Take 1,000 mg by mouth daily.   guaiFENesin 600 MG 12 hr tablet Commonly known as: MUCINEX Take 600 mg by mouth 2 (two) times daily.   hydrocortisone 25 MG suppository Commonly known as: ANUSOL-HC Place 1 suppository (25 mg total) rectally at bedtime. What changed:   when to take this  reasons to take this   K-Phos 500 MG tablet Generic drug: potassium phosphate (monobasic) TAKE 1 TABLET (500MG ) BY MOUTH FOUR TIMES A DAY   levETIRAcetam 500 MG tablet Commonly known as: KEPPRA Take 2-1/2 tablets twice daily What changed: See the new instructions. Changed by: Wyline Copas, MD   levothyroxine 25 MCG tablet Commonly known as: SYNTHROID Take 25 mcg by mouth daily before breakfast.   LORazepam 2 MG/ML concentrated solution Commonly known as: ATIVAN Withdraw 83ml (2mg ) and 39ml  Normal saline into 3 ml syringe. Administer IV push over 3-5 minutes as  needed for recurrent seizures   montelukast 10 MG tablet Commonly known as: SINGULAIR Take 10 mg by mouth at bedtime.   multivitamin with minerals Tabs tablet Take 1 tablet by mouth daily.   Nayzilam 5 MG/0.1ML Soln Generic drug: Midazolam Take 5 mg after 2 minutes of seizures, may repeat in 10 minutes if seizures persist. What changed:   how much to take  how to take this  when to take this  additional instructions   Norco 5-325 MG tablet Generic drug: HYDROcodone-acetaminophen Take 1 tablet by mouth every 4 (four) hours as needed.   Omega-3 500 MG Caps Take 500 mg by mouth daily.   ondansetron 4 MG disintegrating tablet Commonly known as: ZOFRAN-ODT Take 1 tablet as needed for nausea and vomiting What changed:   how much to take  how  to take this  when to take this  reasons to take this  additional instructions   [name] 10 MG tablet Generic drug: cloBAZam TAKE 1 TABLET BY MOUTH IN THE MORNING, 1 TAB IN THE AFTERNOON, AND 2 TABS AT BEDTIME   PHENObarbital 65 MG/ML injection Commonly known as: LUMINAL Withdraw 40ml (65mg ) Phenobarbital and 16ml Normal Saline into 5ml syringe. Adminster IV push for 3-5 minutes as needed for recurrent seizures   potassium chloride SA 20 MEQ tablet Commonly known as: KLOR-CON Take 1 tablet (20 mEq total) by mouth daily.   promethazine 12.5 MG suppository Commonly known as: PHENERGAN Place 12.5 mg rectally every 6 (six) hours as needed for nausea/vomiting.   rosuvastatin 10 MG tablet Commonly known as: CRESTOR Take 10 mg by mouth at bedtime.   Trokendi XR 50 MG Cp24 Generic drug: Topiramate ER Take 1 capsule at nighttime What changed: Another medication with the same name was changed. Make sure you understand how and when to take each.   Trokendi XR 200 MG Cp24 Generic drug: Topiramate ER Take 200 mg by mouth at bedtime. Take 1 capsule (200mg )  by mouth at bedtime What changed: additional instructions   Vimpat 100 MG  Tabs Generic drug: Lacosamide TAKE 1 TABLET BY MOUTH TWICE A DAY    The medication list was reviewed and reconciled. All changes or newly prescribed medications were explained.  A complete medication list was provided to the patient/caregiver.  Marcus Geralds MD

## 2019-11-04 ENCOUNTER — Ambulatory Visit: Payer: Medicaid Other | Admitting: Physical Therapy

## 2019-11-08 ENCOUNTER — Ambulatory Visit: Payer: Medicaid Other | Attending: Orthopedic Surgery | Admitting: Physical Therapy

## 2019-11-08 ENCOUNTER — Other Ambulatory Visit: Payer: Self-pay

## 2019-11-08 ENCOUNTER — Encounter: Payer: Self-pay | Admitting: Physical Therapy

## 2019-11-08 DIAGNOSIS — G8929 Other chronic pain: Secondary | ICD-10-CM | POA: Diagnosis present

## 2019-11-08 DIAGNOSIS — M25611 Stiffness of right shoulder, not elsewhere classified: Secondary | ICD-10-CM

## 2019-11-08 DIAGNOSIS — M6281 Muscle weakness (generalized): Secondary | ICD-10-CM | POA: Diagnosis present

## 2019-11-08 DIAGNOSIS — M25511 Pain in right shoulder: Secondary | ICD-10-CM | POA: Diagnosis not present

## 2019-11-08 DIAGNOSIS — R293 Abnormal posture: Secondary | ICD-10-CM | POA: Diagnosis present

## 2019-11-08 NOTE — Therapy (Signed)
[name], [place], [address] Phone: [phone]   Fax:  [phone]  Physical Therapy Evaluation  Patient Details  Name: [name] MRN: [id number] Date of Birth: Jun 19, 1986 Referring Provider (PT): Earlie Server, MD    Encounter Date: 11/08/2019  PT End of Session - 11/08/19 1449    Visit Number  1    Number of Visits  17    Date for PT Re-Evaluation  01/03/20    Authorization Type  MCD- submitted on 2/16, Resubmit on 4th visit    PT Start Time  1450    PT Stop Time  1535    PT Time Calculation (min)  45 min    Activity Tolerance  Patient tolerated treatment well    Behavior During Therapy  [facility] for tasks assessed/performed       Past Medical History:  Diagnosis Date  . ALLERGIC RHINITIS 12/26/2009  . Blood clot in vein 2015  . Brown's syndrome   . Complication of anesthesia    seizure after pac surgery at [facility] 2013 had to be intubated, no other anes issurs  . Growth disorder   . Headache    migraines  . Heart murmur    moderate MR ses dr [name]  . Hyperlipidemia    No therapy  . Hypokalemia   . Mental retardation, mild (I.Q. 50-70)   . MITRAL VALVE PROLAPSE 06/22/2007  . SEIZURE DISORDER 06/22/2007   last seizure 08-02-2019  . Sleep apnea    cpap , last sleep study 10/01/11  . Unspecified hypothyroidism 06/22/2007    Past Surgical History:  Procedure Laterality Date  . ANKLE SURGERY Left    fused does not bend, wears right leg brace  . BIOPSY  08/22/2019   Procedure: BIOPSY;  Surgeon: Mauri Pole, MD;  Location: [facility];  Service: Endoscopy;;  . COLONOSCOPY WITH PROPOFOL N/A 08/22/2019   Procedure: COLONOSCOPY WITH PROPOFOL;  Surgeon: Mauri Pole, MD;  [facility];  Service: Endoscopy;  Laterality: N/A;  . EYE SURGERY     X2 for Brown Syndrome  . HEMOSTASIS CLIP PLACEMENT  08/22/2019   Procedure: HEMOSTASIS CLIP PLACEMENT;  Surgeon:  Mauri Pole, MD;  [facility]: [facility];  Service: Endoscopy;;  . IMPLANTATION VAGAL NERVE STIMULATOR     Left leaves on for surgeries  . POLYPECTOMY  08/22/2019   Procedure: POLYPECTOMY;  Surgeon: Mauri Pole, MD;  [facility];  Service: Endoscopy;;  . PORTACATH PLACEMENT  2013 and removed and new placed   right chest    There were no vitals filed for this visit.   Subjective Assessment - 11/08/19 1457    Subjective  pt is a 34 y.o M with dx of R humeral fx that occured from a fall from a trip and fell into the door on 09/16/2019 and the bed frame fell on his arm. The arm is getting better with pain at a 2/10. Pain stays in the shoulder with referral. and pt noted pain is improvemeing since onset. He has a hx of seizures with the last one being earlier this morning but averages 1 every 2 weeks.    Limitations  Lifting    How long can you sit comfortably?  unlimited    How long can you stand comfortably?  unlimited    How long can you walk comfortably?  unlimited    Diagnostic tests  x-ray x-ray    Patient Stated Goals  to  decrease pain, to be able ot raise the arm, help around the hosue    Currently in Pain?  Yes    Pain Score  2    wong baker scale   Pain Location  Shoulder    Pain Orientation  Right    Pain Descriptors / Indicators  Aching;Sore    Pain Type  Chronic pain    Pain Onset  More than a month ago    Pain Frequency  Intermittent    Aggravating Factors   laying on the shoulder, using the arm    Pain Relieving Factors  resting, getting of the shoulder    Effect of Pain on Daily Activities  limited [place] mobility and limited ADLS         [facility] PT Assessment - 11/08/19 1446      Assessment   Medical Diagnosis  Right proximal Humeral Fx    Referring Provider (PT)  Earlie Server, MD     Onset Date/Surgical Date  09/16/19    Hand Dominance  Right    Next MD Visit  --   11/28/2019   Prior Therapy  yes      Precautions   Precaution  Comments  no heavy lifting      Balance Screen   Has the patient fallen in the past 6 months  Yes    How many times?  10    Has the patient had a decrease in activity level because of a fear of falling?   No    Is the patient reluctant to leave their home because of a fear of falling?   No      Home Social worker  Private residence    Living Arrangements  Parent    Available Help at Discharge  Family    Type of [place] to enter    Entrance Stairs-Number of Steps  3    Entrance Stairs-Rails  [place]  Other (Comment)   step down [employer] - manual;Grab bars - toilet;Grab bars - tub/shower   sling     Prior Function   Level of Independence  Needs assistance with ADLs    Toileting  Moderate    Dressing  Moderate    Grooming  Moderate      Cognition   Overall Cognitive Status  Within Functional Limits for tasks assessed      Posture/Postural Control   Posture/Postural Control  Postural limitations    Postural Limitations  Rounded Shoulders;Forward head      ROM / Strength   AROM / PROM / Strength  AROM;PROM;Strength      AROM   AROM Assessment Site  Shoulder    Right/Left Shoulder  Right;Left    Right Shoulder Extension  22 Degrees    Right Shoulder Flexion  75 Degrees    Right Shoulder ABduction  55 Degrees    Right Shoulder Internal Rotation  --   R PSIS   Right Shoulder External Rotation  --   frontal    Left Shoulder Extension  30 Degrees    Left Shoulder Flexion  132 Degrees    Left Shoulder ABduction  116 Degrees    Left Shoulder Internal Rotation  --   T9   Left Shoulder External Rotation  --   C7     PROM   PROM Assessment Site  Shoulder  Right/Left Shoulder  Right    Right Shoulder Flexion  14 Degrees   end range pain noted   Right Shoulder ABduction  78 Degrees   end range pain noted     Strength   Overall Strength  Unable to assess;Due to pain    Strength  Assessment Site  Shoulder;Hand    Right/Left Shoulder  Right;Left      Palpation   Palpation comment  TTP along the greater tubercle, along the proximal bicep/ triceps. mulitple tirgger points noted along rhomboids                Objective measurements completed on examination: See above findings.      [facility] Adult PT Treatment/Exercise - 11/08/19 1446      Exercises   Exercises  Shoulder      Shoulder Exercises: Seated   Retraction  Strengthening;Both;5 reps   max cues/ demonstration for proper form            PT Education - 11/08/19 1449    Education Details  evaluation findings, POC, goals, HEP with proper form/ rationale.    Person(s) Educated  Patient    Methods  Explanation;Verbal cues;Handout    Comprehension  Verbalized understanding;Verbal cues required       PT Short Term Goals - 11/08/19 1553      PT SHORT TERM GOAL #1   Title  pt to be I with inital HEP    Baseline  no previous HEp    Time  3    Period  Weeks    Status  New    Target Date  12/06/19      PT SHORT TERM GOAL #2   Title  pt to increase  Rshoulder AROM flexion/ abduction by >/= 10 degrees to  promote R shoulder ROM with </= 5/10 pain    Baseline  flexion 75, and abduction 55 degrees with 6/10 pain    Time  3    Period  Weeks    Status  New    Target Date  12/06/19        PT Long Term Goals - 11/08/19 1725      PT LONG TERM GOAL #1   Title  increase R shoulder flexion/ abduction to [facility] compared bil with </= 2/10 pain for functional mobility for ADLS    Baseline  flexion 75, abduction 55, IR to R PSIS, and ER to frontal bone one R    Time  8    Period  Weeks    Status  New    Target Date  01/03/20      PT LONG TERM GOAL #2   Title  increase R shoulder strength to >/= 4-/5 to promote shoulder stability with lifting and carrying activities    Baseline  unable to test strength secondarty to pain    Time  8    Period  Weeks    Status  New    Target Date  01/03/20       PT LONG TERM GOAL #3   Title  pt to be able to assist his mom with chores around the house with no report of pain or limitations    Baseline  unable to help his mom with chores    Time  8    Period  Weeks    Status  New    Target Date  01/03/20      PT LONG TERM GOAL #4   Title  pt  to be I with all HEP given as of last visit to maintain and progress current level of function    Baseline  no previous HEP    Time  8    Period  Weeks    Status  New    Target Date  01/03/20             Plan - 11/08/19 1542    Clinical Impression Statement  pt presents to OPPT with CC of R shoulder pain following a trip and fall and a bed post landed on his shoulder causing a proximal humeral fx on 09/16/2019. He demonstrates limtation with AROM/ PROM secondary to pain. due to limited ROM and irritabilty strength wasn't assessed today. He would benefit from physical therapy to decrease R shoulder pain, improve ROM and strength and return to PLOF by addressing the defictis listed.    Personal Factors and Comorbidities  Comorbidity 2;Past/Current Experience;Other    Comorbidities  hx of seizures, Mentally challenged    Examination-Activity Limitations  Carry;Lift;Dressing;Hygiene/Grooming    Examination-Participation Restrictions  Laundry;Cleaning;Meal Prep    Stability/Clinical Decision Making  Evolving/Moderate complexity    Clinical Decision Making  Moderate    Rehab Potential  Good    PT Frequency  2x / week    PT Duration  8 weeks    PT Treatment/Interventions  ADLs/Self Care Home Management;Cryotherapy;Moist Heat;Gait training;Therapeutic activities;Therapeutic exercise;Balance training;Neuromuscular re-education;Patient/family education;Manual techniques;Passive range of motion;Dry needling;Taping    PT Next Visit Plan  No e-stim/US hx of epilepsy/ seizures, review/ update HEP, shoulder mobs/ PROM, scapular setting, begin isometrics,    PT Home Exercise Plan  [id number] - table slides for  flexion/ abduction, wand flexion/ scaptions, scapular retraction, towel gripping.    Consulted and Agree with Plan of Care  Patient       Patient will benefit from skilled therapeutic intervention in order to improve the following deficits and impairments:  Decreased strength, Increased muscle spasms, Improper body mechanics, Abnormal gait, Decreased balance, Decreased activity tolerance, Pain, Decreased endurance, Decreased range of motion, Impaired UE functional use  Visit Diagnosis: Chronic right shoulder pain  Muscle weakness (generalized)  Stiffness of right shoulder, not elsewhere classified  Abnormal posture     Problem List Patient Active Problem List   Diagnosis Date Noted  . Adenomatous polyp of transverse colon   . BRBPR (bright red blood per rectum)   . Polyp of sigmoid colon   . Migraine without aura and without status migrainosus, not intractable 02/22/2018  . 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 Hypokalemia 08/03/2013  . Hypothyroidism 08/02/2013  . Subtherapeutic international normalized ratio (INR) 08/02/2013  . Contracture of ankle and foot joint 03/29/2013  . 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 Partial epilepsy with intractable epilepsy ([place]) 12/21/2012  . 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 Acute venous embolism and thrombosis of internal jugular veins ([place]) 12/21/2012  . 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 Mechanical complication of other vascular device, implant, and graft 12/21/2012  . 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 Long term (current) use of anticoagulants 05/21/2012  . Mitral regurgitation 04/29/2012  . A-fib ([place]) 04/08/2012  . Status epilepticus ([place]) 04/08/2012  . Irregular heart beat   . DVT of upper extremity (deep vein thrombosis) ([place]) 09/04/2011  . 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 Allergic rhinitis 12/26/2009  . Mitral valve  disease 06/22/2007   .Dinora Hemm PT, DPT, LAT, ATC  11/08/19  5:31  PM      [place], [place], [address] Phone: [phone]   Fax:  [phone]  Name: Tarez Cardello [facility] Perez MRN: [id number] Date of Birth: July 18, 1986

## 2019-11-15 ENCOUNTER — Other Ambulatory Visit (INDEPENDENT_AMBULATORY_CARE_PROVIDER_SITE_OTHER): Payer: Self-pay | Admitting: Pediatrics

## 2019-11-15 DIAGNOSIS — E876 Hypokalemia: Secondary | ICD-10-CM

## 2019-11-17 ENCOUNTER — Other Ambulatory Visit (INDEPENDENT_AMBULATORY_CARE_PROVIDER_SITE_OTHER): Payer: Self-pay | Admitting: Pediatrics

## 2019-11-17 DIAGNOSIS — E876 Hypokalemia: Secondary | ICD-10-CM

## 2019-11-21 ENCOUNTER — Other Ambulatory Visit: Payer: Self-pay

## 2019-11-21 ENCOUNTER — Ambulatory Visit: Payer: Medicaid Other | Attending: Orthopedic Surgery | Admitting: Physical Therapy

## 2019-11-21 ENCOUNTER — Encounter: Payer: Self-pay | Admitting: Physical Therapy

## 2019-11-21 DIAGNOSIS — M25511 Pain in right shoulder: Secondary | ICD-10-CM | POA: Insufficient documentation

## 2019-11-21 DIAGNOSIS — R293 Abnormal posture: Secondary | ICD-10-CM | POA: Insufficient documentation

## 2019-11-21 DIAGNOSIS — M6281 Muscle weakness (generalized): Secondary | ICD-10-CM | POA: Diagnosis present

## 2019-11-21 DIAGNOSIS — M25611 Stiffness of right shoulder, not elsewhere classified: Secondary | ICD-10-CM | POA: Insufficient documentation

## 2019-11-21 DIAGNOSIS — G8929 Other chronic pain: Secondary | ICD-10-CM

## 2019-11-21 NOTE — Therapy (Signed)
[place], [place], [address] Phone: [phone]   Fax:  [phone]  Physical Therapy Treatment  Patient Details  Name: [name] MRN: [id number] Date of Birth: 28-Jun-1986 Referring Provider (PT): Earlie Server, MD    Encounter Date: 11/21/2019  PT End of Session - 11/21/19 1501    Visit Number  2    Number of Visits  17    Date for PT Re-Evaluation  01/03/20    Authorization Type  MCD- submitted on 2/16, Resubmit on 4th visit    Authorization Time Period  11/21/2019 - 12/11/2019    Authorization - Visit Number  1    Authorization - Number of Visits  3    PT Start Time  [id number]    PT Stop Time  1458    PT Time Calculation (min)  42 min    Activity Tolerance  Patient tolerated treatment well    Behavior During Therapy  [facility] for tasks assessed/performed       Past Medical History:  Diagnosis Date  . ALLERGIC RHINITIS 12/26/2009  . Blood clot in vein 2015  . Brown's syndrome   . Complication of anesthesia    seizure after pac surgery at [facility] 2013 had to be intubated, no other anes issurs  . Growth disorder   . Headache    migraines  . Heart murmur    moderate MR ses dr Darcus Pester  . Hyperlipidemia    No therapy  . Hypokalemia   . Mental retardation, mild (I.Q. 50-70)   . MITRAL VALVE PROLAPSE 06/22/2007  . SEIZURE DISORDER 06/22/2007   last seizure 08-02-2019  . Sleep apnea    cpap , last sleep study 10/01/11  . Unspecified hypothyroidism 06/22/2007    Past Surgical History:  Procedure Laterality Date  . ANKLE SURGERY Left    fused does not bend, wears right leg brace  . BIOPSY  08/22/2019   Procedure: BIOPSY;  Surgeon: Mauri Pole, MD;  [facility]: [facility];  Service: Endoscopy;;  . COLONOSCOPY WITH PROPOFOL N/A 08/22/2019   Procedure: COLONOSCOPY WITH PROPOFOL;  Surgeon: Mauri Pole, MD;  [facility]: [facility];  Service: Endoscopy;  Laterality: N/A;  . EYE  SURGERY     X2 for Brown Syndrome  . HEMOSTASIS CLIP PLACEMENT  08/22/2019   Procedure: HEMOSTASIS CLIP PLACEMENT;  Surgeon: Mauri Pole, MD;  [facility]: [facility];  Service: Endoscopy;;  . IMPLANTATION VAGAL NERVE STIMULATOR     Left leaves on for surgeries  . POLYPECTOMY  08/22/2019   Procedure: POLYPECTOMY;  Surgeon: Mauri Pole, MD;  [facility]: [facility];  Service: Endoscopy;;  . PORTACATH PLACEMENT  2013 and removed and new placed   right chest    There were no vitals filed for this visit.  Subjective Assessment - 11/21/19 1420    Subjective  per pt's mom he is doing about the same. per pt report 0/10 paoin using faces scale    Currently in Pain?  Yes    Pain Score  0-No pain   wong baker scale   Pain Orientation  Right    Pain Onset  More than a month ago    Pain Frequency  Intermittent    Aggravating Factors   laying on the shoulder    Pain Relieving Factors  resting,                       [facility] Adult PT Treatment/Exercise -  11/21/19 0001      Shoulder Exercises: Seated   Other Seated Exercises  balloon (blown up nitril glove) taps with RUE only 1 x 15 at various angles       Shoulder Exercises: Standing   External Rotation  Strengthening;Right;Theraband;10 reps    Theraband Level (Shoulder External Rotation)  Level 1 (Yellow)    Internal Rotation  10 reps;Theraband;Strengthening;Right    Theraband Level (Shoulder Internal Rotation)  Level 1 (Yellow)    Internal Rotation Limitations  visal cues for form    Row  Strengthening;Both;15 reps;Theraband    Theraband Level (Shoulder Row)  Level 1 (Yellow)      Shoulder Exercises: Pulleys   Flexion  2 minutes    Scaption  2 minutes      Manual Therapy   Manual Therapy  Passive ROM;Joint mobilization    Joint Mobilization  PA/ AP grade II GHJ mobs    Passive ROM  flexion/ abduction working into end ranges with grade I distraction oscillations               PT Short Term  Goals - 11/08/19 1553      PT SHORT TERM GOAL #1   Title  pt to be I with inital HEP    Baseline  no previous HEp    Time  3    Period  Weeks    Status  New    Target Date  12/06/19      PT SHORT TERM GOAL #2   Title  pt to increase  Rshoulder AROM flexion/ abduction by >/= 10 degrees to  promote R shoulder ROM with </= 5/10 pain    Baseline  flexion 75, and abduction 55 degrees with 6/10 pain    Time  3    Period  Weeks    Status  New    Target Date  12/06/19        PT Long Term Goals - 11/08/19 1725      PT LONG TERM GOAL #1   Title  increase R shoulder flexion/ abduction to [facility] compared bil with </= 2/10 pain for functional mobility for ADLS    Baseline  flexion 75, abduction 55, IR to R PSIS, and ER to frontal bone one R    Time  8    Period  Weeks    Status  New    Target Date  01/03/20      PT LONG TERM GOAL #2   Title  increase R shoulder strength to >/= 4-/5 to promote shoulder stability with lifting and carrying activities    Baseline  unable to test strength secondarty to pain    Time  8    Period  Weeks    Status  New    Target Date  01/03/20      PT LONG TERM GOAL #3   Title  pt to be able to assist his mom with chores around the house with no report of pain or limitations    Baseline  unable to help his mom with chores    Time  8    Period  Weeks    Status  New    Target Date  01/03/20      PT LONG TERM GOAL #4   Title  pt to be I with all HEP given as of last visit to maintain and progress current level of function    Baseline  no previous HEP    Time  8    Period  Weeks    Status  New    Target Date  01/03/20            Plan - 11/21/19 1556    Clinical Impression Statement  pt is consistent with HEP per pt's mom but isn't a fan of the dowel rod AAROM. continued working on ROM with GHJ mobs, attempted isometrics but with max cues pt had difficulty following instruction. attempted isotonic IR/ER which he did well with except for some  limitation with ROM. continued working ROM with balloon taps which he did very well with.    PT Treatment/Interventions  ADLs/Self Care Home Management;Cryotherapy;Moist Heat;Gait training;Therapeutic activities;Therapeutic exercise;Balance training;Neuromuscular re-education;Patient/family education;Manual techniques;Passive range of motion;Dry needling;Taping    PT Next Visit Plan  No e-stim/US hx of epilepsy/ seizures, review/ update HEP, shoulder mobs/ PROM, scapular setting,    PT Home Exercise Plan  PLTDCD2V - table slides for flexion/ abduction, wand flexion/ scaptions, scapular retraction, towel gripping.    Consulted and Agree with Plan of Care  Patient       Patient will benefit from skilled therapeutic intervention in order to improve the following deficits and impairments:  Decreased strength, Increased muscle spasms, Improper body mechanics, Abnormal gait, Decreased balance, Decreased activity tolerance, Pain, Decreased endurance, Decreased range of motion, Impaired UE functional use  Visit Diagnosis: Chronic right shoulder pain  Muscle weakness (generalized)  Stiffness of right shoulder, not elsewhere classified  Abnormal posture     Problem List Patient Active Problem List   Diagnosis Date Noted  . Adenomatous polyp of transverse colon   . BRBPR (bright red blood per rectum)   . Polyp of sigmoid colon   . Migraine without aura and without status migrainosus, not intractable 02/22/2018  . 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 Hypokalemia 08/03/2013  . Hypothyroidism 08/02/2013  . Subtherapeutic international normalized ratio (INR) 08/02/2013  . Contracture of ankle and foot joint 03/29/2013  . 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 Partial epilepsy with intractable epilepsy ([place]) 12/21/2012  . Acute venous embolism and thrombosis of subclavian veins ([place]) 12/21/2012  . Acute venous embolism and thrombosis of internal  jugular veins ([place]) 12/21/2012  . 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 Mechanical complication of other vascular device, implant, and graft 12/21/2012  . 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 Long term (current) use of anticoagulants 05/21/2012  . Mitral regurgitation 04/29/2012  . A-fib ([place]) 04/08/2012  . Status epilepticus ([place]) 04/08/2012  . Irregular heart beat   . DVT of upper extremity (deep vein thrombosis) ([place]) 09/04/2011  . 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 Allergic rhinitis 12/26/2009  . Mitral valve disease 06/22/2007   Starr Lake PT, DPT, LAT, ATC  11/21/19  3:59 PM      [facility] [address] [place], [place], [address] Phone: [phone]   Fax:  [phone]  Name: [name] MRN: [id number] Date of Birth: 1986/01/29

## 2019-11-28 ENCOUNTER — Other Ambulatory Visit: Payer: Self-pay

## 2019-11-28 ENCOUNTER — Ambulatory Visit: Payer: Medicaid Other | Admitting: Physical Therapy

## 2019-11-28 ENCOUNTER — Encounter: Payer: Self-pay | Admitting: Physical Therapy

## 2019-11-28 DIAGNOSIS — M25611 Stiffness of right shoulder, not elsewhere classified: Secondary | ICD-10-CM

## 2019-11-28 DIAGNOSIS — R293 Abnormal posture: Secondary | ICD-10-CM

## 2019-11-28 DIAGNOSIS — M25511 Pain in right shoulder: Secondary | ICD-10-CM

## 2019-11-28 DIAGNOSIS — M6281 Muscle weakness (generalized): Secondary | ICD-10-CM

## 2019-11-28 DIAGNOSIS — G8929 Other chronic pain: Secondary | ICD-10-CM

## 2019-11-28 NOTE — Therapy (Signed)
[place], [place], [address] Phone: [phone]   Fax:  [phone]  Physical Therapy Treatment  Patient Details  Name: [name] MRN: [id number] Date of Birth: July 31, 1986 Referring Provider (PT): Earlie Server, MD    Encounter Date: 11/28/2019  PT End of Session - 11/28/19 0933    Visit Number  3    Number of Visits  17    Date for PT Re-Evaluation  01/03/20    Authorization Time Period  11/21/2019 - 12/11/2019    Authorization - Visit Number  2    Authorization - Number of Visits  3    PT Start Time  0932    PT Stop Time  [id number]    PT Time Calculation (min)  42 min    Activity Tolerance  Patient tolerated treatment well       Past Medical History:  Diagnosis Date  . ALLERGIC RHINITIS 12/26/2009  . Blood clot in vein 2015  . Brown's syndrome   . Complication of anesthesia    seizure after pac surgery at [facility] 2013 had to be intubated, no other anes issurs  . Growth disorder   . Headache    migraines  . Heart murmur    moderate MR ses dr Darcus Pester  . Hyperlipidemia    No therapy  . Hypokalemia   . Mental retardation, mild (I.Q. 50-70)   . MITRAL VALVE PROLAPSE 06/22/2007  . SEIZURE DISORDER 06/22/2007   last seizure 08-02-2019  . Sleep apnea    cpap , last sleep study 10/01/11  . Unspecified hypothyroidism 06/22/2007    Past Surgical History:  Procedure Laterality Date  . ANKLE SURGERY Left    fused does not bend, wears right leg brace  . BIOPSY  08/22/2019   Procedure: BIOPSY;  Surgeon: Mauri Pole, MD;  Location: [facility];  Service: Endoscopy;;  . COLONOSCOPY WITH PROPOFOL N/A 08/22/2019   Procedure: COLONOSCOPY WITH PROPOFOL;  Surgeon: Mauri Pole, MD;  Location: [facility];  Service: Endoscopy;  Laterality: N/A;  . EYE SURGERY     X2 for Brown Syndrome  . HEMOSTASIS CLIP PLACEMENT  08/22/2019   Procedure: HEMOSTASIS CLIP PLACEMENT;  Surgeon:  Mauri Pole, MD;  Location: [facility];  Service: Endoscopy;;  . IMPLANTATION VAGAL NERVE STIMULATOR     Left leaves on for surgeries  . POLYPECTOMY  08/22/2019   Procedure: POLYPECTOMY;  Surgeon: Mauri Pole, MD;  Location: [facility];  Service: Endoscopy;;  . PORTACATH PLACEMENT  2013 and removed and new placed   right chest    There were no vitals filed for this visit.  Subjective Assessment - 11/28/19 0936    Subjective  He is doing pretty good, per wong baker 0/10    Patient Stated Goals  to decrease pain, to be able ot raise the arm, help around the hosue    Currently in Pain?  Yes    Pain Score  0-No pain    Pain Orientation  Right    Pain Type  Chronic pain    Pain Onset  More than a month ago    Pain Frequency  Intermittent    Aggravating Factors   laying on the shoulder    Pain Relieving Factors  resting         [facility] PT Assessment - 11/28/19 0001      Assessment   Medical Diagnosis  Right proximal Humeral Fx    Referring Provider (  PT)  Earlie Server, MD     Onset Date/Surgical Date  09/16/19      AROM   Right Shoulder Extension  40 Degrees    Right Shoulder Flexion  112 Degrees    Right Shoulder ABduction  110 Degrees                   [facility] Adult PT Treatment/Exercise - 11/28/19 0001      Shoulder Exercises: Seated   Row  Right;Strengthening;12 reps;Theraband    Theraband Level (Shoulder Row)  Level 1 (Yellow)    External Rotation  Strengthening;Right;15 reps;Theraband    Theraband Level (Shoulder External Rotation)  Level 1 (Yellow)    External Rotation Limitations  increased cues for exercise resorting to manual resistance and verbal cues for form    Internal Rotation  Right;Strengthening;12 reps;Theraband    Theraband Level (Shoulder Internal Rotation)  Level 1 (Yellow)      Shoulder Exercises: ROM/Strengthening   UBE (Upper Arm Bike)  4 min x L1   forward/ backward x 2 min ea. MIN a for assistance   Other  ROM/Strengthening Exercises  reaching with RUE at various heights playing tik-tak-to x 4    cues to play ball instead of tossing but did well with ROM     Manual Therapy   Manual therapy comments  MTPR along pec major on R x 2    Joint Mobilization  PA/ AP grade II GHJ mobs    Passive ROM  flexion/ abduction working into end ranges with grade I distraction oscillations               PT Short Term Goals - 11/08/19 1553      PT SHORT TERM GOAL #1   Title  pt to be I with inital HEP    Baseline  no previous HEp    Time  3    Period  Weeks    Status  New    Target Date  12/06/19      PT SHORT TERM GOAL #2   Title  pt to increase  Rshoulder AROM flexion/ abduction by >/= 10 degrees to  promote R shoulder ROM with </= 5/10 pain    Baseline  flexion 75, and abduction 55 degrees with 6/10 pain    Time  3    Period  Weeks    Status  New    Target Date  12/06/19        PT Long Term Goals - 11/08/19 1725      PT LONG TERM GOAL #1   Title  increase R shoulder flexion/ abduction to [facility] compared bil with </= 2/10 pain for functional mobility for ADLS    Baseline  flexion 75, abduction 55, IR to R PSIS, and ER to frontal bone one R    Time  8    Period  Weeks    Status  New    Target Date  01/03/20      PT LONG TERM GOAL #2   Title  increase R shoulder strength to >/= 4-/5 to promote shoulder stability with lifting and carrying activities    Baseline  unable to test strength secondarty to pain    Time  8    Period  Weeks    Status  New    Target Date  01/03/20      PT LONG TERM GOAL #3   Title  pt to be able to assist his mom with chores  around the house with no report of pain or limitations    Baseline  unable to help his mom with chores    Time  8    Period  Weeks    Status  New    Target Date  01/03/20      PT LONG TERM GOAL #4   Title  pt to be I with all HEP given as of last visit to maintain and progress current level of function    Baseline  no previous HEP     Time  8    Period  Weeks    Status  New    Target Date  01/03/20            Plan - 11/28/19 1057    Clinical Impression Statement  pt continues to make progress with shoulder ROM increasing ranges, see flow chart. He continues to demonstrate limtation with abduction and external rotation secondary to pain at end ranges. He does require cues for shoulder strengthening form but otherwise is able to do all activities given.    PT Next Visit Plan  No e-stim/US hx of epilepsy/ seizures, review/ update HEP, shoulder mobs/ PROM, scapular setting, update HEP for shoulder strengthening. MCD resubmit    PT Home Exercise Plan  [id number] - table slides for flexion/ abduction, wand flexion/ scaptions, scapular retraction, towel gripping.    Consulted and Agree with Plan of Care  Patient       Patient will benefit from skilled therapeutic intervention in order to improve the following deficits and impairments:     Visit Diagnosis: Chronic right shoulder pain  Muscle weakness (generalized)  Stiffness of right shoulder, not elsewhere classified  Abnormal posture     Problem List Patient Active Problem List   Diagnosis Date Noted  . Adenomatous polyp of transverse colon   . BRBPR (bright red blood per rectum)   . Polyp of sigmoid colon   . Migraine without aura and without status migrainosus, not intractable 02/22/2018  . 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 Hypokalemia 08/03/2013  . Hypothyroidism 08/02/2013  . Subtherapeutic international normalized ratio (INR) 08/02/2013  . Contracture of ankle and foot joint 03/29/2013  . 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 Partial epilepsy with intractable epilepsy ([place]) 12/21/2012  . Acute venous embolism and thrombosis of subclavian veins ([place]) 12/21/2012  . Acute venous embolism and thrombosis of internal jugular veins ([place]) 12/21/2012  . 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 Mechanical  complication of other vascular device, implant, and graft 12/21/2012  . 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 Long term (current) use of anticoagulants 05/21/2012  . Mitral regurgitation 04/29/2012  . A-fib ([place]) 04/08/2012  . Status epilepticus ([place]) 04/08/2012  . Irregular heart beat   . DVT of upper extremity (deep vein thrombosis) ([place]) 09/04/2011  . 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 Allergic rhinitis 12/26/2009  . Mitral valve disease 06/22/2007   Starr Lake PT, DPT, LAT, ATC  11/28/19  11:00 AM      [facility] [address] [place], [place], [address] Phone: [phone]   Fax:  [phone]  Name: Leovanni Abaya [facility] Perez MRN: [id number] Date of Birth: 01/13/1986

## 2019-12-05 ENCOUNTER — Encounter: Payer: Medicaid Other | Admitting: Physical Therapy

## 2019-12-06 ENCOUNTER — Other Ambulatory Visit (INDEPENDENT_AMBULATORY_CARE_PROVIDER_SITE_OTHER): Payer: Self-pay | Admitting: Pediatrics

## 2019-12-06 ENCOUNTER — Encounter (HOSPITAL_COMMUNITY): Payer: Self-pay | Admitting: Pharmacy Technician

## 2019-12-06 ENCOUNTER — Emergency Department (HOSPITAL_COMMUNITY)
Admission: EM | Admit: 2019-12-06 | Discharge: 2019-12-06 | Disposition: A | Discharge status: Home / Self Care | Payer: Medicaid Other | Attending: Emergency Medicine | Admitting: Emergency Medicine

## 2019-12-06 ENCOUNTER — Emergency Department (HOSPITAL_COMMUNITY): Payer: Medicaid Other

## 2019-12-06 DIAGNOSIS — Z79899 Other long term (current) drug therapy: Secondary | ICD-10-CM | POA: Diagnosis not present

## 2019-12-06 DIAGNOSIS — G40119 Localization-related (focal) (partial) symptomatic epilepsy and epileptic syndromes with simple partial seizures, intractable, without status epilepticus: Secondary | ICD-10-CM

## 2019-12-06 DIAGNOSIS — R569 Unspecified convulsions: Secondary | ICD-10-CM | POA: Insufficient documentation

## 2019-12-06 DIAGNOSIS — I341 Nonrheumatic mitral (valve) prolapse: Secondary | ICD-10-CM | POA: Diagnosis not present

## 2019-12-06 DIAGNOSIS — G40319 Generalized idiopathic epilepsy and epileptic syndromes, intractable, without status epilepticus: Secondary | ICD-10-CM

## 2019-12-06 LAB — URINALYSIS, ROUTINE W REFLEX MICROSCOPIC
Bacteria, UA: NONE SEEN
Bilirubin Urine: NEGATIVE
Glucose, UA: 50 mg/dL — AB
Ketones, ur: 5 mg/dL — AB
Leukocytes,Ua: NEGATIVE
Nitrite: NEGATIVE
Protein, ur: NEGATIVE mg/dL
Specific Gravity, Urine: 1.009 (ref 1.005–1.030)
pH: 6 (ref 5.0–8.0)

## 2019-12-06 LAB — RAPID URINE DRUG SCREEN, HOSP PERFORMED
Amphetamines: NOT DETECTED
Barbiturates: NOT DETECTED
Benzodiazepines: POSITIVE — AB
Cocaine: NOT DETECTED
Opiates: NOT DETECTED
Tetrahydrocannabinol: NOT DETECTED

## 2019-12-06 LAB — CBC WITH DIFFERENTIAL/PLATELET
Abs Immature Granulocytes: 0.06 10*3/uL (ref 0.00–0.07)
Basophils Absolute: 0 10*3/uL (ref 0.0–0.1)
Basophils Relative: 1 %
Eosinophils Absolute: 0.1 10*3/uL (ref 0.0–0.5)
Eosinophils Relative: 1 %
HCT: 41.9 % (ref 39.0–52.0)
Hemoglobin: 12.9 g/dL — ABNORMAL LOW (ref 13.0–17.0)
Immature Granulocytes: 1 %
Lymphocytes Relative: 27 %
Lymphs Abs: 1.2 10*3/uL (ref 0.7–4.0)
MCH: 27.5 pg (ref 26.0–34.0)
MCHC: 30.8 g/dL (ref 30.0–36.0)
MCV: 89.3 fL (ref 80.0–100.0)
Monocytes Absolute: 0.3 10*3/uL (ref 0.1–1.0)
Monocytes Relative: 6 %
Neutro Abs: 2.8 10*3/uL (ref 1.7–7.7)
Neutrophils Relative %: 64 %
Platelets: 250 10*3/uL (ref 150–400)
RBC: 4.69 MIL/uL (ref 4.22–5.81)
RDW: 14.8 % (ref 11.5–15.5)
WBC: 4.4 10*3/uL (ref 4.0–10.5)
nRBC: 0 % (ref 0.0–0.2)

## 2019-12-06 LAB — BASIC METABOLIC PANEL
Anion gap: 10 (ref 5–15)
BUN: 15 mg/dL (ref 6–20)
CO2: 20 mmol/L — ABNORMAL LOW (ref 22–32)
Calcium: 8.4 mg/dL — ABNORMAL LOW (ref 8.9–10.3)
Chloride: 109 mmol/L (ref 98–111)
Creatinine, Ser: 1.06 mg/dL (ref 0.61–1.24)
GFR calc Af Amer: >60 mL/min (ref >60)
GFR calc non Af Amer: >60 mL/min (ref >60)
Glucose, Bld: 104 mg/dL — ABNORMAL HIGH (ref 70–99)
Potassium: 3.7 mmol/L (ref 3.5–5.1)
Sodium: 139 mmol/L (ref 135–145)

## 2019-12-06 LAB — CBG MONITORING, ED: Glucose-Capillary: 96 mg/dL (ref 70–99)

## 2019-12-06 MED ORDER — SODIUM CHLORIDE 0.9 % IV BOLUS
1000.0000 mL | Freq: Once | INTRAVENOUS | Status: AC
  Administered 2019-12-06: 1000 mL via INTRAVENOUS

## 2019-12-06 NOTE — ED Provider Notes (Signed)
[facility] EMERGENCY DEPARTMENT Provider Note   CSN: [id number] Arrival date & time: 12/06/19  1121     History Chief Complaint  Patient presents with  . Seizures    [name] is a 34 y.o. male with history of intellectual disability, mitral valve prolapse, migraine headaches, seizure disorder presents brought in by mother for evaluation of acute onset, resolved seizure-like activity.  She reports that around 10:05 AM the patient was sitting at the kitchen table with his sister when he reportedly became "slumped over" and then began to have tonic-clonic seizure-like activity.  Mother reports that she and her daughter were able to lower the patient to the ground where he had seizure-like activity for approximately 5 minutes.  He attempted to administer intranasal midazolam with no improvement.  She reports that seizure-like activity lasted for 5 minutes after which point the patient became unresponsive and cyanotic.  She reports that he was not breathing and that they were unable to find a pulse on him, but goes on to state that it is difficult to find a pulse on him at baseline.  She states that they then initiated CPR at 5:10 AM and she and her daughter did approximately 30 compressions each and attempted to support the patient's respirations.  She states that he then started breathing spontaneously on his own.  She reports that he was initially a little bit confused but shortly thereafter improved to his normal mental status baseline.  She states that he is currently at his baseline.  She denies urinary incontinence or intraoral injury.  She notes that he did not have some of his seizure medicines yesterday.  The patient denies headache, vision changes, shortness of breath, abdominal pain, chest pain.  Mother has not noted any fevers, nausea or vomiting.  Mother notes that the patient frequently has tonic-clonic seizures that are typically short-lived and not quite  as severe.  She states that the last time that he had a seizure this long that required resuscitation efforts afterwards was 2 years ago but that it is not unheard of for the patient to stop breathing after a seizure and require respiratory support from family.   The history is provided by the patient.       Past Medical History:  Diagnosis Date  . ALLERGIC RHINITIS 12/26/2009  . Blood clot in vein 2015  . Brown's syndrome   . Complication of anesthesia    seizure after pac surgery at [facility] 2013 had to be intubated, no other anes issurs  . Growth disorder   . Headache    migraines  . Heart murmur    moderate MR ses dr Darcus Pester  . Hyperlipidemia    No therapy  . Hypokalemia   . Mental retardation, mild (I.Q. 50-70)   . MITRAL VALVE PROLAPSE 06/22/2007  . SEIZURE DISORDER 06/22/2007   last seizure 08-02-2019  . Sleep apnea    cpap , last sleep study 10/01/11  . Unspecified hypothyroidism 06/22/2007    Patient Active Problem List   Diagnosis Date Noted  . Adenomatous polyp of transverse colon   . BRBPR (bright red blood per rectum)   . Polyp of sigmoid colon   . Migraine without aura and without status migrainosus, not intractable 02/22/2018  . 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 Hypokalemia 08/03/2013  . Hypothyroidism 08/02/2013  . Subtherapeutic international normalized ratio (INR) 08/02/2013  . Contracture of ankle and foot joint 03/29/2013  . 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 Partial epilepsy with intractable epilepsy ([place]) 12/21/2012  . Acute venous embolism and thrombosis of subclavian veins ([place]) 12/21/2012  . Acute venous embolism and thrombosis of internal jugular veins ([place]) 12/21/2012  . 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 Mechanical complication of other vascular device, implant, and graft 12/21/2012  . 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 Long term (current) use of anticoagulants 05/21/2012    . Mitral regurgitation 04/29/2012  . A-fib ([place]) 04/08/2012  . Status epilepticus ([place]) 04/08/2012  . Irregular heart beat   . DVT of upper extremity (deep vein thrombosis) ([place]) 09/04/2011  . 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 Allergic rhinitis 12/26/2009  . Mitral valve disease 06/22/2007    Past Surgical History:  Procedure Laterality Date  . ANKLE SURGERY Left    fused does not bend, wears right leg brace  . BIOPSY  08/22/2019   Procedure: BIOPSY;  Surgeon: Mauri Pole, MD;  Location: [facility];  Service: Endoscopy;;  . COLONOSCOPY WITH PROPOFOL N/A 08/22/2019   Procedure: COLONOSCOPY WITH PROPOFOL;  Surgeon: Mauri Pole, MD;  Location: [facility];  Service: Endoscopy;  Laterality: N/A;  . EYE SURGERY     X2 for Brown Syndrome  . HEMOSTASIS CLIP PLACEMENT  08/22/2019   Procedure: HEMOSTASIS CLIP PLACEMENT;  Surgeon: Mauri Pole, MD;  Location: [facility];  Service: Endoscopy;;  . IMPLANTATION VAGAL NERVE STIMULATOR     Left leaves on for surgeries  . POLYPECTOMY  08/22/2019   Procedure: POLYPECTOMY;  Surgeon: Mauri Pole, MD;  Location: [facility];  Service: Endoscopy;;  . PORTACATH PLACEMENT  2013 and removed and new placed   right chest       Family History  Problem Relation Age of Onset  . Breast cancer Mother   . Colon polyps Mother   . Clotting disorder Mother   . Diabetes Father   . Hypertension Father   . Colon polyps Father   . Hyperlipidemia Father   . Coronary artery disease Maternal Grandfather   . Congestive Heart Failure Maternal Grandfather        Died at 68  . Pneumonia Maternal Grandfather   . Coronary artery disease Paternal Grandfather   . Lung cancer Paternal Grandfather        Died at 36  . Pneumonia Paternal Grandmother        Died at 67  . Stroke Paternal Grandmother     Social History   Tobacco Use  . Smoking status: Never Smoker  . Smokeless tobacco: Never Used  Substance Use Topics  .  Alcohol use: No    Alcohol/week: 0.0 standard drinks  . Drug use: No    Home Medications Prior to Admission medications   Medication Sig Start Date End Date Taking? Authorizing Provider  acetaminophen (TYLENOL) 500 MG tablet Take 1,000 mg by mouth every 6 (six) hours as needed for headache.   Yes [provider]  albuterol (ACCUNEB) 1.25 MG/3ML nebulizer solution Take 1 ampule by nebulization every 4 (four) hours as needed for wheezing.   Yes [provider]  carbamazepine (TEGRETOL) 100 MG chewable tablet CHEW 2 TABLETS IN THE MORNING, 2 TABLETS AT MIDDAY AND 1 & 1/2 TABLETS IN THE EVENING Patient taking differently: Chew 150-200 mg by mouth See admin instructions. CHEW 200mg  IN THE MORNING and AT MIDDAY, then take 150mg  IN THE EVENING. 09/12/19  Yes Hickling, Princess Bruins, MD  cetirizine (ZYRTEC) 10 MG tablet Take 10 mg by  mouth daily. 02/12/15  Yes [provider]  Dextromethorphan-guaiFENesin 5-100 MG/5ML LIQD Take 10 mLs by mouth 2 (two) times daily as needed (cough).   Yes [provider]  diazepam (VALIUM) 2 MG tablet Take 1 tablet every 6 - 8 hours as needed for anxiety or seizures Patient taking differently: Take 2 mg by mouth every 6 (six) hours as needed (seizures).  12/24/16  Yes Hickling, Princess Bruins, MD  Flaxseed, Linseed, (FLAXSEED OIL) 1000 MG CAPS Take 1,000 mg by mouth daily.   Yes [provider]  guaiFENesin (MUCINEX) 600 MG 12 hr tablet Take 600 mg by mouth 2 (two) times daily.   Yes [provider]  K-PHOS 500 MG tablet TAKE 1 TABLET BY MOUTH 4 TIMES A DAY Patient taking differently: Take 500 mg by mouth in the morning, at noon, in the evening, and at bedtime.  11/17/19  Yes Jodi Geralds, MD  Krill Oil (OMEGA-3) 500 MG CAPS Take 500 mg by mouth daily.   Yes [provider]  levETIRAcetam (KEPPRA) 500 MG tablet Take 2-1/2 tablets twice daily Patient taking differently: Take 1,000-1,250 mg by mouth See admin  instructions. 1000mg  in the morning and 1250mg  at bedtime. 10/10/19  Yes Hickling, Princess Bruins, MD  levothyroxine (SYNTHROID, LEVOTHROID) 25 MCG tablet Take 25 mcg by mouth daily before breakfast.    Yes [provider]  LORazepam (ATIVAN) 2 MG/ML injection Inject 2 mg into the vein See admin instructions. Dilute 2mg  of lorazepam with 41mL of saline.  Administer IV push over 3-5 minutes as needed for seizures.   Yes [provider]  montelukast (SINGULAIR) 10 MG tablet Take 10 mg by mouth at bedtime. 02/05/18  Yes [provider]  Multiple Vitamin (MULTIVITAMIN WITH MINERALS) TABS Take 1 tablet by mouth daily.   Yes [provider]  NAYZILAM 5 MG/0.1ML SOLN Take 5 mg after 2 minutes of seizures, may repeat in 10 minutes if seizures persist. Patient taking differently: Place 5 mg into the nose See admin instructions. Take 5 mg into the nose after 2 minutes of seizures, may repeat in 10 minutes if seizures persist. 06/02/19  Yes Hickling, Princess Bruins, MD  ondansetron (ZOFRAN-ODT) 4 MG disintegrating tablet Take 1 tablet as needed for nausea and vomiting Patient taking differently: Take 4 mg by mouth every 8 (eight) hours as needed (nausea and vomiting related to migraines).  09/27/15  Yes Hickling, Princess Bruins, MD  [name] 10 MG tablet TAKE 1 TABLET BY MOUTH IN THE MORNING, 1 TAB IN THE AFTERNOON, AND 2 TABS AT BEDTIME Patient taking differently: Take 10-20 mg by mouth See admin instructions. TAKE 10mg  BY MOUTH IN THE MORNING and IN THE AFTERNOON, then take 20mg  AT BEDTIME 09/22/19  Yes Carylon Perches, MD  PHENObarbital (LUMINAL) 65 MG/ML injection Withdraw 29ml (65mg ) Phenobarbital and 53ml Normal Saline into 65ml syringe. [place] IV push for 3-5 minutes as needed for recurrent seizures Patient taking differently: Inject 65 mg into the vein See admin instructions. Withdraw 48ml (65mg ) Phenobarbital and 63ml Normal Saline into 34ml syringe. [place] IV push for 3-5 minutes as needed for  recurrent seizures 10/15/18  Yes Hickling, Princess Bruins, MD  potassium chloride SA (K-DUR) 20 MEQ tablet Take 1 tablet (20 mEq total) by mouth daily. 06/02/19  Yes Jodi Geralds, MD  promethazine (PHENERGAN) 25 MG suppository Place 25 mg rectally every 6 (six) hours as needed for nausea or vomiting.  07/04/19  Yes [provider]  rosuvastatin (CRESTOR) 10 MG  tablet Take 10 mg by mouth at bedtime. 05/26/16  Yes [provider]  TROKENDI XR 200 MG CP24 Take 200 mg by mouth at bedtime. Take 1 capsule (200mg )  by mouth at bedtime Patient taking differently: Take 200 mg by mouth at bedtime. (Take with 50mg  capsule for a total dose of 250mg  at bedtime) 10/10/19  Yes Hickling, Princess Bruins, MD  TROKENDI XR 50 MG CP24 Take 1 capsule at nighttime Patient taking differently: Take 50 mg by mouth at bedtime. (Take with 200mg  capsule for a total dose of 250mg  at bedtime) 10/10/19  Yes Hickling, Princess Bruins, MD  VIMPAT 100 MG TABS TAKE 1 TABLET BY MOUTH 2 TIMES DAILY Patient taking differently: Take 100 mg by mouth in the morning and at bedtime.  12/06/19  Yes Hickling, Princess Bruins, MD  DUREZOL 0.05 % EMUL Place 1 drop into the right eye 3 (three) times daily. 11/29/19   [provider]  gatifloxacin (ZYMAXID) 0.5 % SOLN Place 1 drop into the right eye 4 (four) times daily. 11/29/19   [provider]  hydrocortisone (ANUSOL-HC) 25 MG suppository Place 1 suppository (25 mg total) rectally at bedtime. Patient not taking: Reported on 12/06/2019 01/11/18   Mauri Pole, MD  ketorolac (ACULAR) 0.5 % ophthalmic solution Place 1 drop into the right eye 4 (four) times daily. 11/29/19   [provider]    Allergies    Antihistamines, chlorpheniramine-type; Divalproex sodium; Phenytoin; Valproic acid and related; and Vancomycin  Review of Systems   Review of Systems  Constitutional: Negative for chills and fever.  Respiratory: Negative for shortness of breath.   Cardiovascular:  Negative for chest pain.  Gastrointestinal: Negative for abdominal pain, nausea and vomiting.  Neurological: Positive for seizures. Negative for weakness and headaches.  All other systems reviewed and are negative.   Physical Exam Updated Vital Signs BP 102/69   Pulse 81   Temp (!) 97.4 F (36.3 C) (Oral)   Resp 18   SpO2 100%   Physical Exam Vitals and nursing note reviewed.  Constitutional:      General: He is not in acute distress.    Appearance: He is well-developed.  HENT:     Head: Normocephalic and atraumatic.     Comments: No Battle's signs, no raccoon's eyes, no rhinorrhea. No hemotympanum. No tenderness to palpation of the face or skull. No deformity, crepitus, or swelling noted.  Eyes:     General:        Right eye: No discharge.        Left eye: No discharge.     Conjunctiva/sclera: Conjunctivae normal.     Pupils: Pupils are equal, round, and reactive to light.  Neck:     Vascular: No JVD.     Trachea: No tracheal deviation.  Cardiovascular:     Rate and Rhythm: Normal rate and regular rhythm.     Pulses: Normal pulses.     Comments: 2+ radial and DP/PT pulses bilaterally, Homans sign absent bilaterally, no lower extremity edema, no palpable cords, compartments are soft  Pulmonary:     Effort: Pulmonary effort is normal.     Breath sounds: Normal breath sounds.     Comments: Anterior chest wall tenderness with no deformity, crepitus, flail segment, or ecchymosis.  No lateral chest wall tenderness Chest:     Chest wall: Tenderness present.  Abdominal:     General: Bowel sounds are normal. There is no distension.     Palpations: Abdomen is soft.  Tenderness: There is no abdominal tenderness. There is no guarding or rebound.  Musculoskeletal:     Cervical back: Neck supple.  Skin:    General: Skin is warm and dry.     Findings: No erythema.  Neurological:     Mental Status: He is alert.     Comments: Oriented to person and place but not time.   Follows commands without difficulty.  Good grip strength bilaterally.  Moves extremities spontaneously without difficulty but does report feeling generally weak and does not wish to move his upper extremities against gravity at this time.  Chronically contracted extremities with muscle wasting  Psychiatric:        Behavior: Behavior normal.     ED Results / Procedures / Treatments   Labs (all labs ordered are listed, but only abnormal results are displayed) Labs Reviewed  BASIC METABOLIC PANEL - Abnormal; Notable for the following components:      Result Value   CO2 20 (*)    Glucose, Bld 104 (*)    Calcium 8.4 (*)    All other components within normal limits  CBC WITH DIFFERENTIAL/PLATELET - Abnormal; Notable for the following components:   Hemoglobin 12.9 (*)    All other components within normal limits  URINALYSIS, ROUTINE W REFLEX MICROSCOPIC - Abnormal; Notable for the following components:   Color, Urine STRAW (*)    Glucose, UA 50 (*)    Hgb urine dipstick SMALL (*)    Ketones, ur 5 (*)    All other components within normal limits  RAPID URINE DRUG SCREEN, HOSP PERFORMED - Abnormal; Notable for the following components:   Benzodiazepines POSITIVE (*)    All other components within normal limits  CBG MONITORING, ED    EKG None  Radiology DG Chest 2 View  Result Date: 12/06/2019 CLINICAL DATA:  Status post chest compressions. Additional history provided: Patient arrives via EMS from home after [place] seizure and unresponsiveness, family reports 20 minutes of CPR. EXAM: CHEST - 2 VIEW COMPARISON:  Chest radiograph 11/26/2018 and earlier FINDINGS: Unchanged position of a right chest infusion port catheter. Redemonstrated left-sided neural stimulator generator with leads extending to the left cervical region. Heart size within normal limits. No evidence of airspace consolidation within the lungs. No evidence of pleural effusion or pneumothorax. No acute bony abnormality is  identified. Overlying cardiac monitoring leads. IMPRESSION: No evidence of acute cardiopulmonary abnormality. Electronically Signed   By: Kellie Simmering DO   On: 12/06/2019 12:52    Procedures Procedures (including critical care time)  Medications Ordered in ED Medications  sodium chloride 0.9 % bolus 1,000 mL (0 mLs Intravenous Stopped 12/06/19 1555)    ED Course  I have reviewed the triage vital signs and the nursing notes.  Pertinent labs & imaging results that were available during my care of the patient were reviewed by me and considered in my medical decision making (see chart for details).    MDM Rules/Calculators/A&P                      Patient presents brought in by family for evaluation after grand mal seizure.  After his seizure-like activity he reportedly became unresponsive and was not breathing.  Mother also reports that it was difficult to find a pulse though also states that is baseline for him.  She reports that he will infrequently but sometimes have seizures that lasted a little bit longer and afterwards will be unresponsive and not breathing  and require support with respirations and even chest compressions.  She does report that the patient is back to his baseline.  He is on multiple antiepileptics and did not have some of his seizure medicines yesterday.    He sees Dr. Gaynell Face and at his last visit on 09/01/2019 notes "He has 3 or 4 generalized tonic-clonic seizures a month although some of the seizures are staring spells that likely represent complex partial seizures.  Medications swiping his vagal nerve stimulator reports that seizure at other times the seizure persists.  They rarely go on longer than a couple of minutes although on occasion in the past they have." Also notes that his seizures have been difficult to control but have been controlled the best on his current medication regimen.  Presently he has no focal neurologic deficits.  He is at his mental status  baseline, moving all extremities spontaneously.  He does report feeling fatigued but is able to raise both upper extremities against gravity and lower extremities as well.  No signs of serious head injury.  He does have have some anterior chest wall soreness on palpation which is not surprising after his family perform chest compressions on him but no evidence of flail segment and has clear lung sounds bilaterally.  Lab work reviewed and interpreted by myself shows no leukocytosis, mild anemia, no renal insufficiency.  His bicarb is a little bit low which is unusual after a seizure.  UA does not suggest UTI or nephrolithiasis but does suggest mild dehydration.  He was given IV fluids in the ED.  His chest x-ray shows no acute cardiopulmonary abnormalities, no rib fractures or pneumothorax.  On reevaluation he is resting comfortably in no apparent distress, eating a sandwich and drinking fluids.  He reports that he is feeling well and would like to go home.  I think this is reasonable and I suspect that he had a breakthrough seizure due to missed dose of his medication. Doubt CVA, meningitis, complex migraine, MI. Encourage medication compliance with mother and patient and recommend follow-up with Dr. Gaynell Face on an outpatient basis.  Discussed strict ED return precautions. Patient and mother verbalized understanding of and agreement with plan and is safe for discharge home at this time.  Discussed case with Dr. Tyrone Nine who reviewed patient's work-up and history and agrees with assessment and plan at this time.   Final Clinical Impression(s) / ED Diagnoses Final diagnoses:  Seizure [facility])    Rx / [place] Orders ED Discharge Orders    None       Renita Papa, PA-C 12/07/19 [place], [place], DO 12/07/19 0802

## 2019-12-06 NOTE — ED Notes (Signed)
Per mom, pt missed 1 onfi and 2 cabamazepine yesterday.

## 2019-12-06 NOTE — ED Triage Notes (Signed)
Pt arrives [facility] from home after grand mal seizure and going unresponsive. Family reports doing 20 minutes of CPR. Given nazolam by family. VSS with EMS. CBG 116, 100/60, HR 90 NSR. Pt in NAD. At baseline mentally, hx MR.

## 2019-12-06 NOTE — Telephone Encounter (Signed)
Please send to the pharmacy °

## 2019-12-06 NOTE — ED Notes (Signed)
Patient Alert and oriented to baseline. Stable and ambulatory to baseline. Patient verbalized understanding of the discharge instructions.  Patient belongings were taken by the patient.   

## 2019-12-06 NOTE — Discharge Instructions (Signed)
Continue taking your medications as prescribed.  Stay well-hydrated.  Avoid activities that could be dangerous to do if you were to have another seizure.  Call Dr. Gaynell Face today to let him know what happened and schedule follow-up if Dr. Gaynell Face feels it is indicated.  Return to the emergency department if any concerning signs or symptoms develop such as altered mental status, fevers, persistent vomiting, difficulty breathing or swallowing.

## 2019-12-06 NOTE — ED Notes (Signed)
Got patient undress into a gown on the monitor patienbt is resting with family at bedside and call bell in reach

## 2019-12-06 NOTE — ED Notes (Signed)
Pt given 200mg  carbamazepine and 10mg  onfi at this time per PA verbal order.

## 2019-12-07 ENCOUNTER — Ambulatory Visit: Payer: Medicaid Other | Admitting: Physical Therapy

## 2019-12-07 ENCOUNTER — Encounter: Payer: Self-pay | Admitting: Physical Therapy

## 2019-12-07 ENCOUNTER — Other Ambulatory Visit: Payer: Self-pay

## 2019-12-07 DIAGNOSIS — R293 Abnormal posture: Secondary | ICD-10-CM

## 2019-12-07 DIAGNOSIS — M25511 Pain in right shoulder: Secondary | ICD-10-CM | POA: Diagnosis not present

## 2019-12-07 DIAGNOSIS — M25611 Stiffness of right shoulder, not elsewhere classified: Secondary | ICD-10-CM

## 2019-12-07 DIAGNOSIS — G8929 Other chronic pain: Secondary | ICD-10-CM

## 2019-12-07 DIAGNOSIS — M6281 Muscle weakness (generalized): Secondary | ICD-10-CM

## 2019-12-07 NOTE — Therapy (Signed)
[place], [place], [address] Phone: [phone]   Fax:  [phone]  Physical Therapy Treatment   Patient Details  Name: [name] MRN: [id number] Date of Birth: 1985/10/11 Referring Provider (PT): Earlie Server, MD    Encounter Date: 12/07/2019  PT End of Session - 12/07/19 1513    Visit Number  4    Number of Visits  17    Date for PT Re-Evaluation  01/03/20    Authorization Type  MCD- submitted on 2/16, Resubmit on 4th visit    Authorization Time Period  11/21/2019 - 12/11/2019    Authorization - Visit Number  3    Authorization - Number of Visits  3    PT Start Time  1510    PT Stop Time  1545    PT Time Calculation (min)  35 min    Activity Tolerance  Patient tolerated treatment well    Behavior During Therapy  [facility] for tasks assessed/performed       Past Medical History:  Diagnosis Date  . ALLERGIC RHINITIS 12/26/2009  . Blood clot in vein 2015  . Brown's syndrome   . Complication of anesthesia    seizure after pac surgery at [facility] 2013 had to be intubated, no other anes issurs  . Growth disorder   . Headache    migraines  . Heart murmur    moderate MR ses dr Darcus Pester  . Hyperlipidemia    No therapy  . Hypokalemia   . Mental retardation, mild (I.Q. 50-70)   . MITRAL VALVE PROLAPSE 06/22/2007  . SEIZURE DISORDER 06/22/2007   last seizure 08-02-2019  . Sleep apnea    cpap , last sleep study 10/01/11  . Unspecified hypothyroidism 06/22/2007    Past Surgical History:  Procedure Laterality Date  . ANKLE SURGERY Left    fused does not bend, wears right leg brace  . BIOPSY  08/22/2019   Procedure: BIOPSY;  Surgeon: Mauri Pole, MD;  Location: [facility];  Service: Endoscopy;;  . COLONOSCOPY WITH PROPOFOL N/A 08/22/2019   Procedure: COLONOSCOPY WITH PROPOFOL;  Surgeon: Mauri Pole, MD;  Location: [facility];  Service: Endoscopy;  Laterality: N/A;  . EYE  SURGERY     X2 for Brown Syndrome  . HEMOSTASIS CLIP PLACEMENT  08/22/2019   Procedure: HEMOSTASIS CLIP PLACEMENT;  Surgeon: Mauri Pole, MD;  Location: [facility];  Service: Endoscopy;;  . IMPLANTATION VAGAL NERVE STIMULATOR     Left leaves on for surgeries  . POLYPECTOMY  08/22/2019   Procedure: POLYPECTOMY;  Surgeon: Mauri Pole, MD;  [facility];  Service: Endoscopy;;  . PORTACATH PLACEMENT  2013 and removed and new placed   right chest    There were no vitals filed for this visit.  Subjective Assessment - 12/07/19 1514    Subjective  per pt's dad he saw the MD and they stated he didn't need to come back but recommended continued PT for a few more visits. yesterday he has a grandmal seizure requiring CPR for 20 min, and was escorted to the ED via ambulance."    Patient Stated Goals  to decrease pain, to be able ot raise the arm, help around the hosue    Currently in Pain?  No/denies    Pain Score  0-No pain   2/10   Pain Orientation  Right    Pain Descriptors / Indicators  Aching    Pain Type  Chronic  pain    Pain Onset  More than a month ago    Pain Frequency  Intermittent    Aggravating Factors   laying on the shoulder    Pain Relieving Factors  resting         [facility] PT Assessment - 12/07/19 0001      Assessment   Medical Diagnosis  Right proximal Humeral Fx    Referring Provider (PT)  Earlie Server, MD     Onset Date/Surgical Date  09/16/19      AROM   Right Shoulder Extension  45 Degrees    Right Shoulder Flexion  125 Degrees    Right Shoulder ABduction  120 Degrees    Right Shoulder Internal Rotation  --   T10   Right Shoulder External Rotation  --   C7     Strength   Right Shoulder Flexion  4-/5    Right Shoulder Extension  4-/5    Right Shoulder ABduction  4-/5    Right Shoulder Internal Rotation  4-/5    Right Shoulder External Rotation  4-/5                   [facility] Adult PT Treatment/Exercise - 12/07/19  0001      Shoulder Exercises: Seated   Other Seated Exercises  balloon (blown up nitril glove) taps with RUE only 1 x 15 at various angles    witn 2 1/2# around wrist     Shoulder Exercises: Standing   External Rotation  Strengthening;Right;Theraband;10 reps    Theraband Level (Shoulder External Rotation)  Level 2 (Red)    Internal Rotation  10 reps;Theraband;Strengthening;Right    Theraband Level (Shoulder Internal Rotation)  Level 2 (Red)    Row  Strengthening;Both;15 reps;Theraband    Theraband Level (Shoulder Row)  Level 2 (Red)      Shoulder Exercises: ROM/Strengthening   Other ROM/Strengthening Exercises  reaching with RUE at various heights playing tik-tak-to x 4    with 2 1/2 #              PT Short Term Goals - 12/07/19 1519      PT SHORT TERM GOAL #1   Title  pt to be I with inital HEP    Period  Weeks      PT SHORT TERM GOAL #2   Title  pt to increase  Rshoulder AROM flexion/ abduction by >/= 10 degrees to  promote R shoulder ROM with </= 5/10 pain    Period  Weeks    Status  Achieved        PT Long Term Goals - 12/07/19 1523      PT LONG TERM GOAL #1   Title  increase R shoulder flexion/ abduction to [facility] compared bil with </= 2/10 pain for functional mobility for ADLS    Baseline  flexion to 125    Period  Weeks    Status  Partially Met      PT LONG TERM GOAL #2   Title  increase R shoulder strength to >/= 4-/5 to promote shoulder stability with lifting and carrying activities    Period  Weeks    Status  On-going      PT LONG TERM GOAL #3   Title  pt to be able to assist his mom with chores around the house with no report of pain or limitations    Period  Weeks      PT LONG TERM GOAL #4  Title  pt to be I with all HEP given as of last visit to maintain and progress current level of function    Baseline  independent with current HEP and progressing as able.    Time  8    Period  Weeks    Status  On-going            Plan - 12/07/19  1556    Clinical Impression Statement  [name] continues to make progress with R shoulder ROM and strength, he does report contined pain rated at 2/10 while laying on the R side. He met all STG's today and is progressing appropriately with his LTG's and demonstrates motivation to continue to improve function. He did well with UE strengthening reporting no pain or issues following session. plan to continue with current POC to promote shoulder strengthening, maximizing funciton finalizing HEP and working toward independent exercise.    PT Frequency  1x / week    PT Duration  4 weeks    PT Treatment/Interventions  ADLs/Self Care Home Management;Cryotherapy;Moist Heat;Gait training;Therapeutic activities;Therapeutic exercise;Balance training;Neuromuscular re-education;Patient/family education;Manual techniques;Passive range of motion;Dry needling;Taping    PT Next Visit Plan  No e-stim/US hx of epilepsy/ seizures, review/ update HEP, shoulder mobs/ PROM, scapular setting, update HEP for shoulder strengthening.    PT Home Exercise Plan  PLTDCD2V - table slides for flexion/ abduction, wand flexion/ scaptions, scapular retraction, towel gripping. shoulder IR/ER, rows and putty gripping.    Consulted and Agree with Plan of Care  Patient       Patient will benefit from skilled therapeutic intervention in order to improve the following deficits and impairments:  Decreased strength, Increased muscle spasms, Improper body mechanics, Abnormal gait, Decreased balance, Decreased activity tolerance, Pain, Decreased endurance, Decreased range of motion, Impaired UE functional use  Visit Diagnosis: Chronic right shoulder pain  Muscle weakness (generalized)  Stiffness of right shoulder, not elsewhere classified  Abnormal posture     Problem List Patient Active Problem List   Diagnosis Date Noted  . Adenomatous polyp of transverse colon   . BRBPR (bright red blood per rectum)   . Polyp of sigmoid colon   .  Migraine without aura and without status migrainosus, not intractable 02/22/2018  . 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 Hypokalemia 08/03/2013  . Hypothyroidism 08/02/2013  . Subtherapeutic international normalized ratio (INR) 08/02/2013  . Contracture of ankle and foot joint 03/29/2013  . 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 Partial epilepsy with intractable epilepsy ([place]) 12/21/2012  . Acute venous embolism and thrombosis of subclavian veins ([place]) 12/21/2012  . Acute venous embolism and thrombosis of internal jugular veins ([place]) 12/21/2012  . 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 Mechanical complication of other vascular device, implant, and graft 12/21/2012  . 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 Long term (current) use of anticoagulants 05/21/2012  . Mitral regurgitation 04/29/2012  . A-fib ([place]) 04/08/2012  . Status epilepticus ([place]) 04/08/2012  . Irregular heart beat   . DVT of upper extremity (deep vein thrombosis) ([place]) 09/04/2011  . 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 Allergic rhinitis 12/26/2009  . Mitral valve disease 06/22/2007   Starr Lake PT, DPT, LAT, ATC  12/07/19  4:05 PM      [facility] [address] [place], [place], [address] Phone: [phone]   Fax:  [phone]  Name: [name] [facility] Perez MRN: [id number] Date of Birth: August 02, 1986

## 2019-12-08 ENCOUNTER — Telehealth (INDEPENDENT_AMBULATORY_CARE_PROVIDER_SITE_OTHER): Payer: Self-pay | Admitting: Family

## 2019-12-08 NOTE — Telephone Encounter (Signed)
Mom contacted me to report that [name] was seen in the ER on 12/06/2019 for a seizure in which he stopped breathing.  Copied from email: Please relay to Dr. Gaynell Face that [name] had a seizure yesterday where he quit breathing again. About 2 min in I tried the Nasal Spray. It didn't help as far as we could tell. [name] tried mouth to mouth a couple times but couldn't get air in. Merrilee Seashore took over. He had to really jerk [name]'s head back because of the ballooning in his neck. It took about 20 min to get him breathing, at which time EMS arrived and took over.  He was transported to [facility]. They did bloodwork, chest X-ray and urinalysis and they all came back fine. He stayed in the ER a few hours and came home. The hospital wanted [place] to make Dr. Gaynell Face aware. The day before we had several distractions of things going on in the morning and I forgot all about [name]'s meds until 3:00pm.  He got his morning meds at 3:00pm.  (I skipped the 3:30 meds of 2 Carbamazepine and 1 [name].) Then at 9:00pm. I gave all bedtime meds, but I did give him 200 mg of Carbamazepine Instead of the 150mg . Then the seizure occurred the next morning at 10:05am. This was a very unusual situation for me to miss his meds the day before.

## 2019-12-29 ENCOUNTER — Ambulatory Visit: Payer: Medicaid Other | Attending: Orthopedic Surgery | Admitting: Physical Therapy

## 2019-12-29 ENCOUNTER — Other Ambulatory Visit: Payer: Self-pay

## 2019-12-29 DIAGNOSIS — M6281 Muscle weakness (generalized): Secondary | ICD-10-CM | POA: Insufficient documentation

## 2019-12-29 DIAGNOSIS — G8929 Other chronic pain: Secondary | ICD-10-CM | POA: Diagnosis present

## 2019-12-29 DIAGNOSIS — M25511 Pain in right shoulder: Secondary | ICD-10-CM | POA: Insufficient documentation

## 2019-12-29 DIAGNOSIS — R293 Abnormal posture: Secondary | ICD-10-CM | POA: Diagnosis present

## 2019-12-29 DIAGNOSIS — M25611 Stiffness of right shoulder, not elsewhere classified: Secondary | ICD-10-CM | POA: Insufficient documentation

## 2019-12-29 NOTE — Therapy (Signed)
[place], [place], [address] Phone: [phone]   Fax:  [phone]  Physical Therapy Treatment / discharge  Patient Details  Name: [name] MRN: [id number] Date of Birth: Mar 19, 1986 Referring Provider (PT): Earlie Server, MD    Encounter Date: 12/29/2019  PT End of Session - 12/29/19 1333    Visit Number  5    Number of Visits  17    Date for PT Re-Evaluation  01/03/20    Authorization Type  MCD- submitted on 2/16, Resubmit on 4th visit    Authorization Time Period  12/28/2019 - 01/24/2020    Authorization - Visit Number  1    Authorization - Number of Visits  4    PT Start Time  0240    PT Stop Time  1403    PT Time Calculation (min)  30 min    Activity Tolerance  Patient tolerated treatment well    Behavior During Therapy  [facility] for tasks assessed/performed       Past Medical History:  Diagnosis Date  . ALLERGIC RHINITIS 12/26/2009  . Blood clot in vein 2015  . Brown's syndrome   . Complication of anesthesia    seizure after pac surgery at [facility] 2013 had to be intubated, no other anes issurs  . Growth disorder   . Headache    migraines  . Heart murmur    moderate MR ses dr Darcus Pester  . Hyperlipidemia    No therapy  . Hypokalemia   . Mental retardation, mild (I.Q. 50-70)   . MITRAL VALVE PROLAPSE 06/22/2007  . SEIZURE DISORDER 06/22/2007   last seizure 08-02-2019  . Sleep apnea    cpap , last sleep study 10/01/11  . Unspecified hypothyroidism 06/22/2007    Past Surgical History:  Procedure Laterality Date  . ANKLE SURGERY Left    fused does not bend, wears right leg brace  . BIOPSY  08/22/2019   Procedure: BIOPSY;  Surgeon: Mauri Pole, MD;  [facility];  Service: Endoscopy;;  . COLONOSCOPY WITH PROPOFOL N/A 08/22/2019   Procedure: COLONOSCOPY WITH PROPOFOL;  Surgeon: Mauri Pole, MD;  [facility];  Service: Endoscopy;  Laterality: N/A;   . EYE SURGERY     X2 for Brown Syndrome  . HEMOSTASIS CLIP PLACEMENT  08/22/2019   Procedure: HEMOSTASIS CLIP PLACEMENT;  Surgeon: Mauri Pole, MD;  [facility];  Service: Endoscopy;;  . IMPLANTATION VAGAL NERVE STIMULATOR     Left leaves on for surgeries  . POLYPECTOMY  08/22/2019   Procedure: POLYPECTOMY;  Surgeon: Mauri Pole, MD;  [facility];  Service: Endoscopy;;  . PORTACATH PLACEMENT  2013 and removed and new placed   right chest    There were no vitals filed for this visit.  Subjective Assessment - 12/29/19 1336    Subjective  per pt's mom he has been doing the exercises. and he is back to doing chores"    Patient Stated Goals  to decrease pain, to be able ot raise the arm, help around the hosue    Currently in Pain?  Yes    Pain Score  0-No pain   via wong baker scale   Pain Orientation  Right    Pain Descriptors / Indicators  Aching    Pain Type  Chronic pain    Pain Onset  More than a month ago    Pain Frequency  Intermittent  [facility] PT Assessment - 12/29/19 0001      AROM   Right Shoulder Flexion  154 Degrees    Right Shoulder ABduction  155 Degrees    Right Shoulder Internal Rotation  --   T11   Right Shoulder External Rotation  --   T1     Strength   Overall Strength Comments  increased cues for proper form    Right Shoulder Flexion  4-/5    Right Shoulder Extension  4-/5    Right Shoulder ABduction  4-/5    Right Shoulder Internal Rotation  4-/5    Right Shoulder External Rotation  4-/5                   [facility] Adult PT Treatment/Exercise - 12/29/19 0001      Shoulder Exercises: Seated   External Rotation  12 reps;Theraband    Theraband Level (Shoulder External Rotation)  Level 2 (Red);Level 3 (Green)    Other Seated Exercises  shoulder flexion and abduction 1 x 5 4# weight around wrist    Other Seated Exercises  anterior pelvic tilt combined with scapular retraction 1 x 10 with tactile cues  for form      Shoulder Exercises: Sidelying   External Rotation  --   1 x 5 reps with 4#, 1 x 10 with no weight            PT Education - 12/29/19 1406    Education Details  reviewed POC, and updated HEP dicussing how to appropriately progress strength and endurance with increaseed reps/ sets and resistance.    Person(s) Educated  Patient    Methods  Explanation;Verbal cues;Handout    Comprehension  Verbalized understanding;Verbal cues required       PT Short Term Goals - 12/29/19 1338      PT SHORT TERM GOAL #1   Title  pt to be I with inital HEP    Period  Weeks    Status  Achieved      PT SHORT TERM GOAL #2   Title  pt to increase  Rshoulder AROM flexion/ abduction by >/= 10 degrees to  promote R shoulder ROM with </= 5/10 pain    Period  Weeks    Status  Achieved        PT Long Term Goals - 12/29/19 1339      PT LONG TERM GOAL #1   Title  increase R shoulder flexion/ abduction to [facility] compared bil with </= 2/10 pain for functional mobility for ADLS    Period  Weeks      PT LONG TERM GOAL #2   Title  increase R shoulder strength to >/= 4-/5 to promote shoulder stability with lifting and carrying activities    Period  Weeks    Status  Achieved      PT LONG TERM GOAL #3   Title  pt to be able to assist his mom with chores around the house with no report of pain or limitations    Period  Weeks    Status  Achieved      PT LONG TERM GOAL #4   Title  pt to be I with all HEP given as of last visit to maintain and progress current level of function    Period  Weeks    Status  Achieved            Plan - 12/29/19 1407    Clinical Impression Statement  [name]  has made great progress with physical therapy increasing shoulder ROM, strength and additionally reports no pain. Per pt's mom's report he has return to regular activity and demonstrates no visual signs of pain or limitations. He did well with todays session with strengthening, He is able to maintain and  progress his current function at home and will be discharged from PT today.    PT Treatment/Interventions  ADLs/Self Care Home Management;Cryotherapy;Moist Heat;Gait training;Therapeutic activities;Therapeutic exercise;Balance training;Neuromuscular re-education;Patient/family education;Manual techniques;Passive range of motion;Dry needling;Taping    PT Next Visit Plan  No e-stim/US hx of epilepsy/ seizures, review/ update HEP, shoulder mobs/ PROM, scapular setting, update HEP for shoulder strengthening.    PT Home Exercise Plan  PLTDCD2V - table slides for flexion/ abduction, wand flexion/ scaptions, scapular retraction, towel gripping. shoulder IR/ER, rows and putty gripping.    Consulted and Agree with Plan of Care  Patient       Patient will benefit from skilled therapeutic intervention in order to improve the following deficits and impairments:  Decreased strength, Increased muscle spasms, Improper body mechanics, Abnormal gait, Decreased balance, Decreased activity tolerance, Pain, Decreased endurance, Decreased range of motion, Impaired UE functional use  Visit Diagnosis: Chronic right shoulder pain  Muscle weakness (generalized)  Stiffness of right shoulder, not elsewhere classified  Abnormal posture     Problem List Patient Active Problem List   Diagnosis Date Noted  . Adenomatous polyp of transverse colon   . BRBPR (bright red blood per rectum)   . Polyp of sigmoid colon   . Migraine without aura and without status migrainosus, not intractable 02/22/2018  . 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 Hypokalemia 08/03/2013  . Hypothyroidism 08/02/2013  . Subtherapeutic international normalized ratio (INR) 08/02/2013  . Contracture of ankle and foot joint 03/29/2013  . 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 Partial epilepsy with intractable epilepsy ([place]) 12/21/2012  . Acute venous embolism and thrombosis of subclavian veins ([place])  12/21/2012  . Acute venous embolism and thrombosis of internal jugular veins ([place]) 12/21/2012  . 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 Mechanical complication of other vascular device, implant, and graft 12/21/2012  . 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 Long term (current) use of anticoagulants 05/21/2012  . Mitral regurgitation 04/29/2012  . A-fib ([place]) 04/08/2012  . Status epilepticus ([place]) 04/08/2012  . Irregular heart beat   . DVT of upper extremity (deep vein thrombosis) ([place]) 09/04/2011  . 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 Allergic rhinitis 12/26/2009  . Mitral valve disease 06/22/2007    Starr Lake 12/29/2019, 2:14 PM  [facility] [address] [place], [place], [address] Phone: [phone]   Fax:  [phone]  Name: [name] MRN: [id number] Date of Birth: November 11, 1985      PHYSICAL THERAPY DISCHARGE SUMMARY  Visits from Start of Care: 5  Current functional level related to goals / functional outcomes: See goals   Remaining deficits: N/A    Education / Equipment: HEP, theraband, putty, posture,   Plan: Patient agrees to discharge.  Patient goals were met. Patient is being discharged due to meeting the stated rehab goals.  ?????          Keondre Markson PT, DPT, LAT, ATC  12/29/19  2:14 PM

## 2020-01-04 ENCOUNTER — Other Ambulatory Visit (INDEPENDENT_AMBULATORY_CARE_PROVIDER_SITE_OTHER): Payer: Self-pay | Admitting: Pediatrics

## 2020-01-04 DIAGNOSIS — E876 Hypokalemia: Secondary | ICD-10-CM

## 2020-01-05 ENCOUNTER — Ambulatory Visit: Payer: Medicaid Other | Admitting: Physical Therapy

## 2020-01-12 ENCOUNTER — Encounter: Payer: Medicaid Other | Admitting: Physical Therapy

## 2020-01-16 ENCOUNTER — Encounter: Payer: Self-pay | Admitting: Gastroenterology

## 2020-01-16 ENCOUNTER — Other Ambulatory Visit: Payer: Self-pay

## 2020-01-16 ENCOUNTER — Ambulatory Visit (INDEPENDENT_AMBULATORY_CARE_PROVIDER_SITE_OTHER): Payer: Medicaid Other | Admitting: Pediatrics

## 2020-01-16 ENCOUNTER — Ambulatory Visit (INDEPENDENT_AMBULATORY_CARE_PROVIDER_SITE_OTHER): Payer: Medicaid Other | Admitting: Gastroenterology

## 2020-01-16 ENCOUNTER — Encounter (INDEPENDENT_AMBULATORY_CARE_PROVIDER_SITE_OTHER): Payer: Self-pay | Admitting: Pediatrics

## 2020-01-16 VITALS — BP 110/80 | HR 80 | Ht 62.0 in | Wt 120.8 lb

## 2020-01-16 VITALS — BP 96/68 | Temp 97.3°F | Ht 63.0 in | Wt 120.0 lb

## 2020-01-16 DIAGNOSIS — Z86010 Personal history of colonic polyps: Secondary | ICD-10-CM

## 2020-01-16 DIAGNOSIS — R2689 Other abnormalities of gait and mobility: Secondary | ICD-10-CM | POA: Diagnosis not present

## 2020-01-16 DIAGNOSIS — G40319 Generalized idiopathic epilepsy and epileptic syndromes, intractable, without status epilepticus: Secondary | ICD-10-CM | POA: Diagnosis not present

## 2020-01-16 DIAGNOSIS — E876 Hypokalemia: Secondary | ICD-10-CM

## 2020-01-16 DIAGNOSIS — G4733 Obstructive sleep apnea (adult) (pediatric): Secondary | ICD-10-CM

## 2020-01-16 DIAGNOSIS — F70 Mild intellectual disabilities: Secondary | ICD-10-CM

## 2020-01-16 DIAGNOSIS — R21 Rash and other nonspecific skin eruption: Secondary | ICD-10-CM | POA: Diagnosis not present

## 2020-01-16 DIAGNOSIS — G40119 Localization-related (focal) (partial) symptomatic epilepsy and epileptic syndromes with simple partial seizures, intractable, without status epilepticus: Secondary | ICD-10-CM | POA: Diagnosis not present

## 2020-01-16 MED ORDER — POTASSIUM CHLORIDE CRYS ER 20 MEQ PO TBCR: 20.0000 meq | EXTENDED_RELEASE_TABLET | Freq: Every day | ORAL | 5 refills | Status: DC

## 2020-01-16 MED ORDER — CARBAMAZEPINE 100 MG PO CHEW: CHEWABLE_TABLET | ORAL | 5 refills | Status: DC

## 2020-01-16 MED ORDER — TROKENDI XR 50 MG PO CP24: ORAL_CAPSULE | ORAL | 5 refills | Status: DC

## 2020-01-16 MED ORDER — K-PHOS 500 MG PO TABS: 500.0000 mg | ORAL_TABLET | Freq: Four times a day (QID) | ORAL | 4 refills | Status: DC

## 2020-01-16 NOTE — Patient Instructions (Signed)
If you are age 33 or older, your body mass index should be between 23-30. Your Body mass index is 21.26 kg/m. If this is out of the aforementioned range listed, please consider follow up with your Primary Care Provider.  If you are age 28 or younger, your body mass index should be between 19-25. Your Body mass index is 21.26 kg/m. If this is out of the aformentioned range listed, please consider follow up with your Primary Care Provider.    We will see you as needed.   Thank you for choosing me and [facility].  Dr.Nandigam

## 2020-01-16 NOTE — Patient Instructions (Signed)
Was a pleasure to see you today.  I am concerned about the frequency of [name]'s seizures but I am not certain how we can change his medicine to make that better.  I refilled the prescriptions he asked me to refill.  His vagal nerve stimulator continues to work well.  We will see you in 3 months.  I wish you all health during this very difficult time.  Please let me know if I can answer any other questions.

## 2020-01-16 NOTE — Progress Notes (Signed)
[name]    NB:[id number]    [birth year]  Primary Care Physician:[name] Alert, MD  Referring Physician: Chesley Noon, MD [name],  [name] [id number]   Chief complaint:  Peri anal bump  HPI:  34 year old male here for follow-up visit accompanied by his mother. His mom noticed a bump in the perirectal area about 2 months ago when she was wiping him after a bowel movement.  Patient says he has intermittent pain or discomfort when he gets wiped around the area.  No rectal bleeding.  No change in bowel habits.  Otherwise he is doing well.  Colonoscopy August 22, 2019: 3 cm loculated pedunculated tubular adenoma removed from sigmoid colon.  2 additional diminutive tubular adenomas removed from transverse colon.  Small internal hemorrhoids.  Outpatient Encounter Medications as of 01/16/2020  Medication Sig   acetaminophen (TYLENOL) 500 MG tablet Take 1,000 mg by mouth every 6 (six) hours as needed for headache.   albuterol (ACCUNEB) 1.25 MG/3ML nebulizer solution Take 1 ampule by nebulization every 4 (four) hours as needed for wheezing.   carbamazepine (TEGRETOL) 100 MG chewable tablet CHEW 2 TABLETS IN THE MORNING, 2 TABLETS AT MIDDAY AND 1 & 1/2 TABLETS IN THE EVENING   cetirizine (ZYRTEC) 10 MG tablet Take 10 mg by mouth daily.   Dextromethorphan-guaiFENesin 5-100 MG/5ML LIQD Take 10 mLs by mouth 2 (two) times daily as needed (cough).   diazepam (VALIUM) 2 MG tablet Take 1 tablet every 6 - 8 hours as needed for anxiety or seizures (Patient taking differently: Take 2 mg by mouth every 6 (six) hours as needed (seizures). )   DUREZOL 0.05 % EMUL Place 1 drop into the right eye 3 (three) times daily.   Flaxseed, Linseed, (FLAXSEED OIL) 1000 MG CAPS Take 1,000 mg by mouth daily.   gatifloxacin (ZYMAXID) 0.5 % SOLN Place 1 drop into the right eye 4 (four) times daily.   guaiFENesin (MUCINEX) 600 MG 12 hr tablet Take 600 mg by mouth  2 (two) times daily.   ketorolac (ACULAR) 0.5 % ophthalmic solution Place 1 drop into the right eye 4 (four) times daily.   Krill Oil (OMEGA-3) 500 MG CAPS Take 500 mg by mouth daily.   levETIRAcetam (KEPPRA) 500 MG tablet Take 2-1/2 tablets twice daily (Patient taking differently: Take 1,000-1,250 mg by mouth See admin instructions. Take 2 tablets in the morning and 2 1/2 tablets at night)   levothyroxine (SYNTHROID, LEVOTHROID) 25 MCG tablet Take 25 mcg by mouth daily before breakfast.    LORazepam (ATIVAN) 2 MG/ML injection Inject 2 mg into the vein See admin instructions. Dilute 2mg  of lorazepam with 4mL of saline.  Administer IV push over 3-5 minutes as needed for seizures.   montelukast (SINGULAIR) 10 MG tablet Take 10 mg by mouth at bedtime.   Multiple Vitamin (MULTIVITAMIN WITH MINERALS) TABS Take 1 tablet by mouth daily.   NAYZILAM 5 MG/0.1ML SOLN Take 5 mg after 2 minutes of seizures, may repeat in 10 minutes if seizures persist. (Patient taking differently: Place 5 mg into the nose See admin instructions. Take 5 mg into the nose after 2 minutes of seizures, may repeat in 10 minutes if seizures persist.)   ondansetron (ZOFRAN-ODT) 4 MG disintegrating tablet Take 1 tablet as needed for nausea and vomiting (Patient taking differently: Take 4 mg by mouth every 8 (eight) hours as needed (nausea and vomiting related to migraines). )  [name] 10 MG tablet TAKE 1 TABLET BY MOUTH IN THE MORNING, 1 TAB IN THE AFTERNOON, AND 2 TABS AT BEDTIME (Patient taking differently: Take 10-20 mg by mouth See admin instructions. TAKE 10mg  BY MOUTH IN THE MORNING and IN THE AFTERNOON, then take 20mg  AT BEDTIME)   PHENObarbital (LUMINAL) 65 MG/ML injection Withdraw 33ml (65mg ) Phenobarbital and 26ml Normal Saline into 84ml syringe. Adminster IV push for 3-5 minutes as needed for recurrent seizures (Patient taking differently: Inject 65 mg into the vein See admin instructions. Withdraw 30ml (65mg ) Phenobarbital and  1ml Normal Saline into 9ml syringe. Adminster IV push for 3-5 minutes as needed for recurrent seizures)   potassium chloride SA (KLOR-CON M20) 20 MEQ tablet Take 1 tablet (20 mEq total) by mouth daily.   potassium phosphate, monobasic, (K-PHOS) 500 MG tablet Take 1 tablet (500 mg total) by mouth 4 (four) times daily.   promethazine (PHENERGAN) 25 MG suppository Place 25 mg rectally every 6 (six) hours as needed for nausea or vomiting.    rosuvastatin (CRESTOR) 10 MG tablet Take 10 mg by mouth at bedtime.   TROKENDI XR 200 MG CP24 Take 200 mg by mouth at bedtime. Take 1 capsule (200mg )  by mouth at bedtime (Patient taking differently: Take 200 mg by mouth at bedtime. (Take with 50mg  capsule for a total dose of 250mg  at bedtime))   TROKENDI XR 50 MG CP24 Take 1 capsule at nighttime   VIMPAT 100 MG TABS TAKE 1 TABLET BY MOUTH 2 TIMES DAILY (Patient taking differently: Take 100 mg by mouth in the morning and at bedtime. )   [DISCONTINUED] carbamazepine (TEGRETOL) 100 MG chewable tablet CHEW 2 TABLETS IN THE MORNING, 2 TABLETS AT MIDDAY AND 1 & 1/2 TABLETS IN THE EVENING (Patient taking differently: Chew 150-200 mg by mouth See admin instructions. CHEW 200mg  IN THE MORNING and AT MIDDAY, then take 150mg  IN THE EVENING.)   [DISCONTINUED] hydrocortisone (ANUSOL-HC) 25 MG suppository Place 1 suppository (25 mg total) rectally at bedtime. (Patient not taking: Reported on 12/06/2019)   [DISCONTINUED] K-PHOS 500 MG tablet TAKE 1 TABLET BY MOUTH 4 TIMES A DAY (Patient taking differently: Take 500 mg by mouth in the morning, at Perez, in the evening, and at bedtime. )   [DISCONTINUED] KLOR-CON M20 20 MEQ tablet TAKE 1 TABLET BY MOUTH EVERY DAY   [DISCONTINUED] TROKENDI XR 50 MG CP24 Take 1 capsule at nighttime (Patient taking differently: Take 50 mg by mouth at bedtime. (Take with 200mg  capsule for a total dose of 250mg  at bedtime))   No facility-administered encounter medications on file as of 01/16/2020.     Allergies as of 01/16/2020 - Review Complete 01/16/2020  Allergen Reaction Noted   Antihistamines, chlorpheniramine-type Other (See Comments) 04/21/2014   Divalproex sodium Other (See Comments)    Phenytoin Rash and Other (See Comments)    Valproic acid and related Other (See Comments) 04/21/2014   Vancomycin Rash, Other (See Comments), and Swelling 01/08/2012    Past Medical History:  Diagnosis Date   ALLERGIC RHINITIS 12/26/2009   Blood clot in vein 2015   Brown's syndrome    Complication of anesthesia    seizure after pac surgery at [facility] 2013 had to be intubated, no other anes issurs   Growth disorder    Headache    migraines   Heart murmur    moderate MR ses dr Darcus Pester   Hyperlipidemia    No therapy   Hypokalemia    Mental retardation, mild (I.Q. 50-70)  MITRAL VALVE PROLAPSE 06/22/2007   SEIZURE DISORDER 06/22/2007   last seizure 08-02-2019   Sleep apnea    cpap , last sleep study 10/01/11   Unspecified hypothyroidism 06/22/2007    Past Surgical History:  Procedure Laterality Date   ANKLE SURGERY Left    fused does not bend, wears right leg brace   BIOPSY  08/22/2019   Procedure: BIOPSY;  Surgeon: Mauri Pole, MD;  Location: [facility];  Service: Endoscopy;;   COLONOSCOPY WITH PROPOFOL N/A 08/22/2019   Procedure: COLONOSCOPY WITH PROPOFOL;  Surgeon: Mauri Pole, MD;  Location: [facility];  Service: Endoscopy;  Laterality: N/A;   EYE SURGERY     X2 for Brown Syndrome   HEMOSTASIS CLIP PLACEMENT  08/22/2019   Procedure: HEMOSTASIS CLIP PLACEMENT;  Surgeon: Mauri Pole, MD;  Location: [facility];  Service: Endoscopy;;   IMPLANTATION VAGAL NERVE STIMULATOR     Left leaves on for surgeries   POLYPECTOMY  08/22/2019   Procedure: POLYPECTOMY;  Surgeon: Mauri Pole, MD;  Location: [facility];  Service: Endoscopy;;   PORTACATH PLACEMENT  2013 and removed and new placed   right chest    Family  History  Problem Relation Age of Onset   Breast cancer Mother    Colon polyps Mother    Clotting disorder Mother    Diabetes Father    Hypertension Father    Colon polyps Father    Hyperlipidemia Father    Coronary artery disease Maternal Grandfather    Congestive Heart Failure Maternal Grandfather        Died at 53   Pneumonia Maternal Grandfather    Coronary artery disease Paternal Grandfather    Lung cancer Paternal Grandfather        Died at 56   Pneumonia Paternal Grandmother        Died at 80   Stroke Paternal Grandmother     Social History   Socioeconomic History   Marital status: Single    Spouse name: Not on file   Number of children: 0   Years of education: Not on file   Highest education level: Not on file  Occupational History   Occupation: Disabled    Employer: UNEMPLOYED  Tobacco Use   Smoking status: Never Smoker   Smokeless tobacco: Never Used  Substance and Sexual Activity   Alcohol use: No    Alcohol/week: 0.0 standard drinks   Drug use: No   Sexual activity: Never  Other Topics Concern   Not on file  Social History Narrative   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    He lives with his parents.    He enjoys helping around the house, Nascar, and football.   Social Determinants of Health   Financial Resource Strain:    Difficulty of Paying Living Expenses:   Food Insecurity:    Worried About Charity fundraiser in the Last Year:    Arboriculturist in the Last Year:   Transportation Needs:    Film/video editor (Medical):    Lack of Transportation (Non-Medical):   Physical Activity:    Days of Exercise per Week:    Minutes of Exercise per Session:   Stress:    Feeling of Stress :   Social Connections:    Frequency of Communication with Friends and Family:    Frequency of Social Gatherings with Friends and Family:  Attends Religious  Services:    Active Member of Clubs or Organizations:    Attends Music therapist:    Marital Status:   Intimate Partner Violence:    Fear of Current or Ex-Partner:    Emotionally Abused:    Physically Abused:    Sexually Abused:       Review of systems: All other review of systems negative except as mentioned in the HPI.   Physical Exam: Vitals:   01/16/20 1328  BP: 96/68  Temp: (!) 97.3 F (36.3 C)   Body mass index is 21.26 kg/m. Gen:      No acute distress Neuro: alert and oriented Psych: normal mood and affect Rectal exam: Normal anal sphincter tone, no anal fissure or external hemorrhoids Anoscopy: Small grade 1 internal hemorrhoids, no active bleeding, normal dentate line, no visible nodules   Data Reviewed:  Reviewed labs, radiology imaging, old records and pertinent past GI work up   Assessment and Plan/Recommendations:  34 year old male, very pleasant, history of seizure disorder and mild intellectual disability, large adenomatous sigmoid polyp removed November 2020  Perianal bump noticed by mother during routine hygiene, rectal exam with no nodularity or perianal swelling or redness. Small grade 1 internal hemorrhoids.  No bleeding.  Advised to continue to monitor.  Keep the perianal area dry, and avoid excessive rubbing. Okay to do sitz bath or soaking bath with Epsom salt as needed  Due for surveillance colonoscopy for follow-up due to history of adenomatous colon polyps in December 2023  Follow-up as needed  This visit required 35 minutes of patient care (this includes precharting, chart review, review of results, face-to-face time used for counseling as well as treatment plan and follow-up. The patient was provided an opportunity to ask questions and all were answered. The patient agreed with the plan and demonstrated an understanding of the instructions.  Damaris Hippo , MD    CC: Marcus Noon, MD

## 2020-01-16 NOTE — Progress Notes (Signed)
Patient: [name] MRN: [id number] Sex: male DOB: October 10, 1985  Provider: Wyline Copas, MD Location of Care: [facility]  Note type: Routine return visit  History of Present Illness: Referral Source: Anastasia Pall, MD History from: both parents, patient and [facility] chart Chief Complaint: Epilepsy  [name] is a 34 y.o. male who was evaluated January 16, 2020 for the first time since October 10, 2019.  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  He also has focal epilepsy with impairment of consciousness.  He has hypokalemia requires potassium replacement 4 times seizure control has worsened.    Since his last visit on January 18 there have been 3 episodes of focal epilepsy lasting 1 to 2 minutes with impairment of consciousness associated with apnea of about 30 seconds in duration.  There have been 9 episodes of generalized tonic-clonic seizures lasting 3 to 5 minutes in duration associated with periods of apnea lasting from 30 seconds to a minute and a half.  On March 16, had a period of apnea lasting 20 minutes that required rescue breathing.  The amount of resistance he had because of his neck swelling made this extremely difficult.  He was transported by EMS to [facility] and recovered.  There really been two episodes that we know about where his seizures started and stopped as result of the vagal nerve stimulator being swiped.  In general his health is good.  Since the family contracted Covid in December they have remained relatively healthy.  His sister's migraines have markedly worsened.  It is not clear if this is a "long-haul" affect.  His father who is hospitalized has recovered.  I asked him today if they planned to get immunizations and there is great hesitancy at this time.  We have not changed his antiepileptic medications in some time.  Most of them have been optimized as has his  vagal nerve stimulator.  Procedure: Interrogation and reprogramming of Vagal Nerve Stimulator  Implantation of the current stimulator 104 serial # [id number] implanted 04/19/14   Interrogation of the vagal nerve stimulator: Voltage 2.25 mA. Frequency 20 Hz, pulse width 250 microseconds, signal time on30seconds, signal time off 1.1 minutes;magnet current 2.50 mA, magnet signal time on 60 seconds, magnet pulse width 250 microseconds.  The normal mode diagnostics using his current settings revealed the battery is50%full, and communication, output, and impedance are good; 1669ohms. There is no indication change the battery. There have been 1924 magnet activations since implantationup until November 29, 2018. There have been 4587 magnet stimulations since implantation, 2663 since November 29, 2018.  He tolerated the procedure well.  Review of Systems: A complete review of systems was remarkable for patient is here to be seen for epilepsy and to have his VNS interrogated. Mom reports that the patient has had nine gran mal seizures since his last visit. She also reports that the patient has had seven petit mal seizures as well. She states that one grna mal seizure, the patient stopped breathing for 20 minutes. She states that two seizures were stopped by his VNS. She reports that she has concerns about the patient stopped breathing. No other concerns at this time,, all other systems reviewed and negative.  Past Medical History Diagnosis Date  . ALLERGIC RHINITIS 12/26/2009  . Blood clot in vein 2015  . Brown's syndrome   . Complication of anesthesia    seizure after pac surgery at [facility] 2013 had to be intubated,  no other anes issurs  . Growth disorder   . Headache    migraines  . Heart murmur    moderate MR ses dr Darcus Pester  . Hyperlipidemia    No therapy  . Hypokalemia   . Mental retardation, mild (I.Q. 50-70)   . MITRAL VALVE PROLAPSE 06/22/2007  . SEIZURE DISORDER 06/22/2007   last seizure  08-02-2019  . Sleep apnea    cpap , last sleep study 10/01/11  . Unspecified hypothyroidism 06/22/2007   Hospitalizations: No., Head Injury: No., Nervous System Infections: No., Immunizations up to date: Yes.    Copied from prior chart Diagnosis of developmental delay as a toddler.   EEG 11/07/86 was normal for gestational age.   Genetics evaluation short stature, prenatal onset, chromosomal study: 38 XY, fragile X syndrome negative, evaluation for MELAS and MERRF were negative.  Initial seizure 08/28/94 left locomotor with secondary generalization.   MRI of the brain 09/13/93: Scattered subcortical white matter lesions at the parietotemporal parieto-occipital junction is ischemic versus hamartomas.   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   Diagnosis attention deficit disorder inattentive type treated without success with Cylert April 1995  MRI brain 04/04/94 stable differential diagnosis hamartoma, vasculitis, ischemic, or embolic phenomenon.   Status epilepticus 01/31/95, and 04/15/95 Neurontin added to Tegretol   MRI brain 06/14/96 unchanged   Admission to [facility]. Tegretol 30 mcg for militate. 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   Topiramate started in May 1999 unable to be tolerated with Tegretol and was tapered and discontinued.  January 2000 EEG showed right greater than left mid temporal sharp waves was otherwise well-organized EEG. He hospitalizations in March, July, October, in November for recurrent seizures.  Patient  placed on Depakote in April 2000 and developed gagging abdominal pain headache and had significant change in transaminases and liver functions. Topamax was restarted and Keppra was added.  Vimpat was started July 16, 2012 and adjusted upwards.   [name] was added on November 18, 2012 and has been adjusted upwards. Topamax was changed to Trokendi XR in attempt to deal with sleepiness, unsteadiness caused by polypharmacy. We to these medications have been introduced, the patient experiences somewhat improved seizure control however Is quite likely that despite better seizure control, the patient is experiencing impairment in cognition and gait as a result of polypharmacy .  MRI of the brain on March 21, 2013. The patient has extensive calcification to the basal ganglia bilaterally, normal ventricle size, and no acute findings. EKG was unchanged  Birth History He was a breech presentation, cesarean section delivery. He had intrauterine growth retardation, and failure to thrive.  Behavior History Mild intellectual disability  Surgical History Procedure Laterality Date  . ANKLE SURGERY Left    fused does not bend, wears right leg brace  . BIOPSY  08/22/2019   Procedure: BIOPSY;  Surgeon: Mauri Pole, MD;  Location: [facility];  Service: Endoscopy;;  . COLONOSCOPY WITH PROPOFOL N/A 08/22/2019   Procedure: COLONOSCOPY WITH PROPOFOL;  Surgeon: Mauri Pole, MD;  Location: [facility];  Service: Endoscopy;  Laterality: N/A;  . EYE SURGERY  X2 for [employer] Syndrome  . HEMOSTASIS CLIP PLACEMENT  08/22/2019   Procedure: HEMOSTASIS CLIP PLACEMENT;  Surgeon: Mauri Pole, MD;  Location: [facility];  Service: Endoscopy;;  . IMPLANTATION VAGAL NERVE STIMULATOR     Left leaves on for surgeries  . POLYPECTOMY  08/22/2019   Procedure: POLYPECTOMY;  Surgeon: Mauri Pole, MD;  Location: [facility];  Service: Endoscopy;;  . PORTACATH PLACEMENT  2013 and removed and  new placed   right chest   Family History family history includes Breast cancer in his mother; Clotting disorder in his mother; Colon polyps in his father and mother; Congestive Heart Failure in his maternal grandfather; Coronary artery disease in his maternal grandfather and paternal grandfather; Diabetes in his father; Hyperlipidemia in his father; Hypertension in his father; Lung cancer in his paternal grandfather; Pneumonia in his maternal grandfather and paternal grandmother; Stroke in his paternal grandmother. Family history is negative for migraines, seizures, intellectual disabilities, blindness, deafness, birth defects, chromosomal disorder, or autism.  Social History Socioeconomic History  . Marital status: Single  . Years of education: 26  . Highest education level: High school certificiate  Occupational History  . Occupation: Disabled    Employer: UNEMPLOYED  Tobacco Use  . Smoking status: Never Smoker  . Smokeless tobacco: Never Used  Substance and Sexual Activity  . Alcohol use: No    Alcohol/week: 0.0 standard drinks  . Drug use: No  . 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     He lives with his parents.     He enjoys helping around the house, Nascar, and football.   Allergies Allergen Reactions  . Antihistamines, Chlorpheniramine-Type Other (See Comments)    Other Reaction: Dimetapp causes seizures  . Divalproex Sodium Other (See Comments)    Nausea, vomiting, liver and kidney dysfunction  . Phenytoin Rash and Other (See Comments)    Dilantin causes more seizures.   . Valproic Acid And Related Other (See Comments)    Shuts down systems  . Vancomycin Rash, Other (See Comments) and Swelling    If given too fast, causes red man reaction Other Reaction: Redman's Syndrome   Physical Exam BP 110/80   Pulse 80   Ht 5\' 2"  (1.575 m)   Wt 120 lb  12.8 oz (54.8 kg)   BMI 22.09 kg/m   General: alert, well developed, well nourished, in no acute distress, brown hair, hazel eyes, right handed Head: normocephalic, no dysmorphic features Ears, Nose and Throat: Otoscopic: tympanic membranes normal; pharynx: oropharynx is pink without exudates or tonsillar hypertrophy Neck: supple, diminished range of motion, no cranial or cervical bruits, head is tilted with the ear toward the right shoulder and his chin to the left, appears to be a mild torticollis Respiratory: auscultation clear Cardiovascular: no murmurs, pulses are normal Musculoskeletal: no skeletal deformities or apparent scoliosis Skin: no rashes or neurocutaneous lesions  Neurologic Exam  Mental Status: alert; oriented to person, place and year; knowledge is below normal for age; language is normal; speech is dysarthric Cranial Nerves: visual fields are full to double simultaneous stimuli; extraocular movements are full and conjugate; pupils are round reactive to light; funduscopic examination shows positive red reflex; symmetric facial strength; midline tongue and uvula; air conduction is greater than bone conduction bilaterally Motor: mild proximal symmetric weakness, tone and mass; clumsy fine motor movements; no  pronator drift Sensory: intact responses to cold, vibration, proprioception and stereognosis Coordination: good finger-to-nose, rapid repetitive alternating movements and finger apposition Gait and Station: wide-based, antalgic, diplegic gait and station; balance is poor; Romberg exam is negative Reflexes: symmetric and diminished bilaterally; no clonus; bilateral flexor plantar responses  Assessment 1.  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 2.  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 3.  Obstructive sleep apnea, G47.33. 4.  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 5.  Mild intellectual disability, F70. 6.  Hypokalemia,  E87.6.  Discussion [name] is stable.  His seizures are not.  Over time, he has gradually experienced increasing frequency and severity of his seizures.  I am concerned about the periods of apnea in particular the 1 where he required resuscitation over 20 minutes.  I think it is unlikely that if we make any changes in his medications that we will see dramatic improvement in his seizures.  Plan Prescriptions were refilled for 100 mg carbamazepine, 50 mg Trokendi, Klor-Con, and K-Phos.  He will return to see me in 3 months' time I will see him sooner based on clinical need.  Greater than 50% of a 25-minute visit was spent in counseling and coordination of care and in addition the vagal nerve stimulator was interrogated.   Medication List   Accurate as of January 16, 2020 11:42 AM. If you have any questions, ask your nurse or doctor.    acetaminophen 500 MG tablet Commonly known as: TYLENOL Take 1,000 mg by mouth every 6 (six) hours as needed for headache.   albuterol 1.25 MG/3ML nebulizer solution Commonly known as: ACCUNEB Take 1 ampule by nebulization every 4 (four) hours as needed for wheezing.   carbamazepine 100 MG chewable tablet Commonly known as: TEGRETOL CHEW 2 TABLETS IN THE MORNING, 2 TABLETS AT MIDDAY AND 1 & 1/2 TABLETS IN THE EVENING What changed:   how much to take  how to take this  when to take this  additional instructions   cetirizine 10 MG tablet Commonly known as: ZYRTEC Take 10 mg by mouth daily.   Dextromethorphan-guaiFENesin 5-100 MG/5ML Liqd Take 10 mLs by mouth 2 (two) times daily as needed (cough).   diazepam 2 MG tablet Commonly known as: Valium Take 1 tablet every 6 - 8 hours as needed for anxiety or seizures What changed:   how much to take  how to take this  when to take this  reasons to take this  additional instructions   Durezol 0.05 % Emul Generic drug: Difluprednate Place 1 drop into the right eye 3 (three) times daily.    Flaxseed Oil 1000 MG Caps Take 1,000 mg by mouth daily.   gatifloxacin 0.5 % Soln Commonly known as: ZYMAXID Place 1 drop into the right eye 4 (four) times daily.   guaiFENesin 600 MG 12 hr tablet Commonly known as: MUCINEX Take 600 mg by mouth 2 (two) times daily.   hydrocortisone 25 MG suppository Commonly known as: ANUSOL-HC Place 1 suppository (25 mg total) rectally at bedtime.   K-Phos 500 MG tablet Generic drug: potassium phosphate (monobasic) TAKE 1 TABLET BY MOUTH 4 TIMES A DAY What changed:   how much to take  when to take this   ketorolac 0.5 % ophthalmic solution Commonly known as: ACULAR Place 1 drop into the right eye 4 (four) times daily.   Klor-Con M20 20 MEQ tablet Generic drug: potassium chloride SA TAKE 1 TABLET BY MOUTH EVERY DAY   levETIRAcetam 500 MG tablet Commonly  known as: KEPPRA Take 2-1/2 tablets twice daily What changed:   how much to take  how to take this  when to take this  additional instructions   levothyroxine 25 MCG tablet Commonly known as: SYNTHROID Take 25 mcg by mouth daily before breakfast.   LORazepam 2 MG/ML injection Commonly known as: ATIVAN Inject 2 mg into the vein See admin instructions. Dilute 2mg  of lorazepam with 53mL of saline.  Administer IV push over 3-5 minutes as needed for seizures.   montelukast 10 MG tablet Commonly known as: SINGULAIR Take 10 mg by mouth at bedtime.   multivitamin with minerals Tabs tablet Take 1 tablet by mouth daily.   Nayzilam 5 MG/0.1ML Soln Generic drug: Midazolam Take 5 mg after 2 minutes of seizures, may repeat in 10 minutes if seizures persist. What changed:   how much to take  how to take this  when to take this  additional instructions   Omega-3 500 MG Caps Take 500 mg by mouth daily.   ondansetron 4 MG disintegrating tablet Commonly known as: ZOFRAN-ODT Take 1 tablet as needed for nausea and vomiting What changed:   how much to take  how to take  this  when to take this  reasons to take this  additional instructions   [name] 10 MG tablet Generic drug: cloBAZam TAKE 1 TABLET BY MOUTH IN THE MORNING, 1 TAB IN THE AFTERNOON, AND 2 TABS AT BEDTIME What changed:   how much to take  how to take this  when to take this  additional instructions   PHENObarbital 65 MG/ML injection Commonly known as: LUMINAL Withdraw 84ml (65mg ) Phenobarbital and 76ml Normal Saline into 56ml syringe. Adminster IV push for 3-5 minutes as needed for recurrent seizures What changed:   how much to take  how to take this  when to take this   promethazine 25 MG suppository Commonly known as: PHENERGAN Place 25 mg rectally every 6 (six) hours as needed for nausea or vomiting.   rosuvastatin 10 MG tablet Commonly known as: CRESTOR Take 10 mg by mouth at bedtime.   Trokendi XR 50 MG Cp24 Generic drug: Topiramate ER Take 1 capsule at nighttime What changed:   how much to take  how to take this  when to take this  additional instructions   Trokendi XR 200 MG Cp24 Generic drug: Topiramate ER Take 200 mg by mouth at bedtime. Take 1 capsule (200mg )  by mouth at bedtime What changed: additional instructions   Vimpat 100 MG Tabs Generic drug: Lacosamide TAKE 1 TABLET BY MOUTH 2 TIMES DAILY What changed:   how much to take  when to take this    The medication list was reviewed and reconciled. All changes or newly prescribed medications were explained.  A complete medication list was provided to the patient/caregiver.  Jodi Geralds MD

## 2020-01-17 ENCOUNTER — Telehealth: Payer: Self-pay | Admitting: Cardiovascular Disease

## 2020-01-17 NOTE — Telephone Encounter (Signed)
Patient's mother states that she and her son's healthcare guardian will need to accompany the patient to his appointment with Dr. Angelena Form tomorrow 01/18/20.

## 2020-01-17 NOTE — Telephone Encounter (Signed)
Patient's appointment note has been updated

## 2020-01-18 ENCOUNTER — Encounter: Payer: Self-pay | Admitting: Cardiovascular Disease

## 2020-01-18 ENCOUNTER — Other Ambulatory Visit: Payer: Self-pay

## 2020-01-18 ENCOUNTER — Ambulatory Visit (INDEPENDENT_AMBULATORY_CARE_PROVIDER_SITE_OTHER): Payer: Medicaid Other | Admitting: Cardiovascular Disease

## 2020-01-18 VITALS — BP 112/70 | HR 80 | Ht 63.0 in | Wt 119.0 lb

## 2020-01-18 DIAGNOSIS — I340 Nonrheumatic mitral (valve) insufficiency: Secondary | ICD-10-CM | POA: Diagnosis not present

## 2020-01-18 NOTE — Patient Instructions (Signed)
Medication Instructions:  No changes *If you need a refill on your cardiac medications before your next appointment, please call your pharmacy*   Lab Work: none If you have labs (blood work) drawn today and your tests are completely normal, you will receive your results only by: . MyChart Message (if you have MyChart) OR . A paper copy in the mail If you have any lab test that is abnormal or we need to change your treatment, we will call you to review the results.   Testing/Procedures: Your physician has requested that you have an echocardiogram. Echocardiography is a painless test that uses sound waves to create images of your heart. It provides your doctor with information about the size and shape of your heart and how well your heart's chambers and valves are working. This procedure takes approximately one hour. There are no restrictions for this procedure.   Follow-Up: At [facility], you and your health needs are our priority.  As part of our continuing mission to provide you with exceptional heart care, we have created designated Provider Care Teams.  These Care Teams include your primary Cardiologist (physician) and Advanced Practice Providers (APPs -  Physician Assistants and Nurse Practitioners) who all work together to provide you with the care you need, when you need it.   Your next appointment:   12 month(s)  The format for your next appointment:   In Person  Provider:   You may see Christopher McAlhany, MD or one of the following Advanced Practice Providers on your designated Care Team:    Dayna Dunn, PA-C  Michele Lenze, PA-C   Other Instructions   

## 2020-01-18 NOTE — Progress Notes (Signed)
Chief Complaint  Patient presents with  . 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 He was initially seen as a new consult for his atrial fibrillation by Dr. Jolyn Nap on 04/08/12. He was admitted on 04/08/12 to get a [name] replacement. They removed the old [name] but were not able to insert a new one because of right-sided central venous occlusion. During wire manipulatoin, he developed rapid atrial fibrillation.  In PACU, the atrial fibrillation resolved without medical intervention. He has had no atrial fibrillation since then. He has frequent seizures for many years. Cardiac monitor in 2015 showed sinus rhythm with no arrhythmias. Last echo February 2017 with normal LV function, moderate mitral regurgitation.  He is here today for follow up. The patient denies any chest pain, dyspnea, palpitations, lower extremity edema, orthopnea, PND, dizziness, near syncope or syncope. He had a prolonged seizure last month with respiratory arrest.    Primary Care Physician: Chesley Noon, MD   Past Medical History:  Diagnosis Date  . ALLERGIC RHINITIS 12/26/2009  . Blood clot in vein 2015  . Brown's syndrome   . Complication of anesthesia    seizure after pac surgery at [facility] 2013 had to be intubated, no other anes issurs  . Growth disorder   . Headache    migraines  . Heart murmur    moderate MR ses dr Darcus Pester  . Hyperlipidemia    No therapy  . Hypokalemia   . Mental retardation, mild (I.Q. 50-70)   . MITRAL VALVE PROLAPSE 06/22/2007  . SEIZURE DISORDER 06/22/2007   last seizure 08-02-2019  . Sleep apnea    cpap , last sleep study 10/01/11  . Unspecified hypothyroidism 06/22/2007    Past Surgical History:  Procedure  Laterality Date  . ANKLE SURGERY Left    fused does not bend, wears right leg brace  . BIOPSY  08/22/2019   Procedure: BIOPSY;  Surgeon: Mauri Pole, MD;  Location: [facility];  Service: Endoscopy;;  . COLONOSCOPY WITH PROPOFOL N/A 08/22/2019   Procedure: COLONOSCOPY WITH PROPOFOL;  Surgeon: Mauri Pole, MD;  Location: [facility];  Service: Endoscopy;  Laterality: N/A;  . EYE SURGERY     X2 for Brown Syndrome  . HEMOSTASIS CLIP PLACEMENT  08/22/2019   Procedure: HEMOSTASIS CLIP PLACEMENT;  Surgeon: Mauri Pole, MD;  Location: [facility];  Service: Endoscopy;;  . IMPLANTATION VAGAL NERVE STIMULATOR     Left leaves on for surgeries  . POLYPECTOMY  08/22/2019   Procedure: POLYPECTOMY;  Surgeon: Mauri Pole, MD;  Location: [facility];  Service: Endoscopy;;  . PORTACATH PLACEMENT  2013 and removed and new placed   right chest    Current Outpatient Medications  Medication Sig Dispense Refill  . acetaminophen (TYLENOL) 500 MG tablet Take 1,000 mg by mouth every 6 (six) hours as needed for headache.    . albuterol (ACCUNEB) 1.25 MG/3ML nebulizer solution Take 1 ampule by nebulization every 4 (four) hours as needed for wheezing.    . carbamazepine (TEGRETOL) 100 MG chewable tablet CHEW 2 TABLETS IN THE MORNING, 2 TABLETS AT MIDDAY AND 1 & 1/2 TABLETS IN THE EVENING 170 tablet 5  . cetirizine (ZYRTEC) 10 MG tablet Take 10 mg by mouth daily.    Marland Kitchen  Dextromethorphan-guaiFENesin 5-100 MG/5ML LIQD Take 10 mLs by mouth 2 (two) times daily as needed (cough).    . diazepam (VALIUM) 2 MG tablet Take 1 tablet every 6 - 8 hours as needed for anxiety or seizures 30 tablet 5  . DUREZOL 0.05 % EMUL Place 1 drop into the right eye 3 (three) times daily.    . Flaxseed, Linseed, (FLAXSEED OIL) 1000 MG CAPS Take 1,000 mg by mouth daily.    Marland Kitchen gatifloxacin (ZYMAXID) 0.5 % SOLN Place 1 drop into the right eye 4 (four) times daily.    Marland Kitchen guaiFENesin (MUCINEX) 600 MG 12 hr tablet  Take 600 mg by mouth 2 (two) times daily.    Marland Kitchen ketorolac (ACULAR) 0.5 % ophthalmic solution Place 1 drop into the right eye 4 (four) times daily.    Javier Docker Oil (OMEGA-3) 500 MG CAPS Take 500 mg by mouth daily.    Marland Kitchen levETIRAcetam (KEPPRA) 500 MG tablet Take 500 mg by mouth as directed. Takes 2 tablets in the morning and 2.5 tablets in the evening    . levothyroxine (SYNTHROID, LEVOTHROID) 25 MCG tablet Take 25 mcg by mouth daily before breakfast.     . LORazepam (ATIVAN) 2 MG/ML injection Inject 2 mg into the vein See admin instructions. Dilute 2mg  of lorazepam with 43mL of saline.  Administer IV push over 3-5 minutes as needed for seizures.    . montelukast (SINGULAIR) 10 MG tablet Take 10 mg by mouth at bedtime.  6  . Multiple Vitamin (MULTIVITAMIN WITH MINERALS) TABS Take 1 tablet by mouth daily.    Marland Kitchen NAYZILAM 5 MG/0.1ML SOLN Take 5 mg after 2 minutes of seizures, may repeat in 10 minutes if seizures persist. (Patient taking differently: Place 5 mg into the nose See admin instructions. Take 5 mg into the nose after 2 minutes of seizures, may repeat in 10 minutes if seizures persist.) 2 each 5  . ondansetron (ZOFRAN-ODT) 4 MG disintegrating tablet Take 1 tablet as needed for nausea and vomiting (Patient taking differently: Take 4 mg by mouth every 8 (eight) hours as needed (nausea and vomiting related to migraines). ) 20 tablet 5  . [name] 10 MG tablet TAKE 1 TABLET BY MOUTH IN THE MORNING, 1 TAB IN THE AFTERNOON, AND 2 TABS AT BEDTIME (Patient taking differently: Take 10-20 mg by mouth See admin instructions. TAKE 10mg  BY MOUTH IN THE MORNING and IN THE AFTERNOON, then take 20mg  AT BEDTIME) 124 tablet 5  . PHENObarbital (LUMINAL) 65 MG/ML injection Withdraw 65ml (65mg ) Phenobarbital and 62ml Normal Saline into 66ml syringe. Adminster IV push for 3-5 minutes as needed for recurrent seizures (Patient taking differently: Inject 65 mg into the vein See admin instructions. Withdraw 82ml (65mg ) Phenobarbital and  52ml Normal Saline into 79ml syringe. Adminster IV push for 3-5 minutes as needed for recurrent seizures) 5 mL 5  . potassium chloride SA (KLOR-CON M20) 20 MEQ tablet Take 1 tablet (20 mEq total) by mouth daily. 30 tablet 5  . potassium phosphate, monobasic, (K-PHOS) 500 MG tablet Take 1 tablet (500 mg total) by mouth 4 (four) times daily. 124 tablet 4  . promethazine (PHENERGAN) 25 MG suppository Place 25 mg rectally every 6 (six) hours as needed for nausea or vomiting.     . rosuvastatin (CRESTOR) 10 MG tablet Take 10 mg by mouth at bedtime.  5  . TROKENDI XR 200 MG CP24 Take 200 mg by mouth at bedtime. Take 1 capsule (200mg )  by mouth at bedtime (Patient  taking differently: Take 200 mg by mouth at bedtime. (Take with 50mg  capsule for a total dose of 250mg  at bedtime)) 31 capsule 5  . TROKENDI XR 50 MG CP24 Take 1 capsule at nighttime 31 capsule 5  . VIMPAT 100 MG TABS TAKE 1 TABLET BY MOUTH 2 TIMES DAILY 60 tablet 5   No current facility-administered medications for this visit.    Allergies  Allergen Reactions  . Antihistamines, Chlorpheniramine-Type Other (See Comments)    Other Reaction: Dimetapp causes seizures  . Divalproex Sodium Other (See Comments)    Nausea, vomiting, liver and kidney dysfunction  . Phenytoin Rash and Other (See Comments)    Dilantin causes more seizures.   . Valproic Acid And Related Other (See Comments)    Shuts down systems  . Vancomycin Rash, Other (See Comments) and Swelling    If given too fast, causes red man reaction Other Reaction: Redman's Syndrome    Social History   Socioeconomic History  . Marital status: Single    Spouse name: Not on file  . Number of children: 0  . Years of education: Not on file  . Highest education level: Not on file  Occupational History  . Occupation: Disabled    Employer: UNEMPLOYED  Tobacco Use  . Smoking status: Never Smoker  . Smokeless tobacco: Never Used  Substance and Sexual Activity  . Alcohol use: No     Alcohol/week: 0.0 standard drinks  . Drug use: No  . Sexual activity: Never  Other Topics Concern  . 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    He lives with his parents.    He enjoys helping around the house, Nascar, and football.   Social Determinants of Health   Financial Resource Strain:   . Difficulty of Paying Living Expenses:   Food Insecurity:   . Worried About Charity fundraiser in the Last Year:   . Arboriculturist in the Last Year:   Transportation Needs:   . Film/video editor (Medical):   Marland Kitchen Lack of Transportation (Non-Medical):   Physical Activity:   . Days of Exercise per Week:   . Minutes of Exercise per Session:   Stress:   . Feeling of Stress :   Social Connections:   . Frequency of Communication with Friends and Family:   . Frequency of Social Gatherings with Friends and Family:   . Attends Religious Services:   . Active Member of Clubs or Organizations:   . Attends Archivist Meetings:   Marland Kitchen Marital Status:   Intimate Partner Violence:   . Fear of Current or Ex-Partner:   . Emotionally Abused:   Marland Kitchen Physically Abused:   . Sexually Abused:     Family History  Problem Relation Age of Onset  . Breast cancer Mother   . Colon polyps Mother   . Clotting disorder Mother   . Diabetes Father   . Hypertension Father   . Colon polyps Father   . Hyperlipidemia Father   . Coronary artery disease Maternal Grandfather   . Congestive Heart Failure Maternal Grandfather        Died at 33  . Pneumonia Maternal Grandfather   . Coronary artery disease Paternal Grandfather   . Lung cancer Paternal Grandfather        Died at 29  . Pneumonia Paternal Grandmother  Died at 42  . Stroke Paternal Grandmother     Review of Systems:  As stated in the HPI and otherwise negative.   BP 112/70   Pulse 80   Ht 5\' 3"  (1.6 m)   Wt 119 lb (54 kg)    SpO2 99%   BMI 21.08 kg/m   Physical Examination: General: Well developed, well nourished, NAD  HEENT: OP clear, mucus membranes moist  SKIN: warm, dry. No rashes. Neuro: No focal deficits  Musculoskeletal: Muscle strength 5/5 all ext  Psychiatric: Mood and affect normal  Neck: No JVD, no carotid bruits, no thyromegaly, no lymphadenopathy.  Lungs:Clear bilaterally, no wheezes, rhonci, crackles Cardiovascular: Regular rate and rhythm. No murmurs, gallops or rubs. Abdomen:Soft. Bowel sounds present. Non-tender.  Extremities: No lower extremity edema. Pulses are 2 + in the bilateral DP/PT.  Echo 10/29/15: Left ventricle: The cavity size was mildly dilated. Wall   thickness was normal. The estimated ejection fraction was 55%.   Doppler parameters are consistent with abnormal left ventricular   relaxation (grade 1 diastolic dysfunction). - Mitral valve: There was moderate regurgitation. - Left atrium: The atrium was mildly dilated. - Atrial septum: No defect or patent foramen ovale was identified.   EKG:  EKG is not ordered today. The ekg ordered today demonstrates   Recent Labs: 12/06/2019: BUN 15; Creatinine, Ser 1.06; Hemoglobin 12.9; Platelets 250; Potassium 3.7; Sodium 139    Wt Readings from Last 3 Encounters:  01/18/20 119 lb (54 kg)  01/16/20 120 lb (54.4 kg)  01/16/20 120 lb 12.8 oz (54.8 kg)     Other studies Reviewed: Additional studies/ records that were reviewed today include:  Review of the above records demonstrates:    Assessment and Plan:   1. Atrial fibrillation, paroxysmal: One episode during a procedure in July 2013. Likely induced by the wire. No recurrence. Sinus today.       2. Mitral regurgitation: Moderate MR by echo 2017.  Repeat echo now.   Current medicines are reviewed at length with the patient today.  The patient does not have concerns regarding medicines.  The following changes have been made:  no change  Labs/ tests ordered today  include:   Orders Placed This Encounter  Procedures  . ECHOCARDIOGRAM COMPLETE    Disposition:   FU with me in 12  months  Signed, Lauree Chandler, MD 01/18/2020 12:11 PM    [facility] [address], [place], [place]  [address] Phone: [phone]; Fax: [phone]

## 2020-02-06 ENCOUNTER — Other Ambulatory Visit: Payer: Self-pay

## 2020-02-06 ENCOUNTER — Ambulatory Visit (HOSPITAL_COMMUNITY): Payer: Medicaid Other | Attending: Cardiology

## 2020-02-06 DIAGNOSIS — I340 Nonrheumatic mitral (valve) insufficiency: Secondary | ICD-10-CM | POA: Insufficient documentation

## 2020-02-07 DIAGNOSIS — H2012 Chronic iridocyclitis, left eye: Secondary | ICD-10-CM | POA: Insufficient documentation

## 2020-03-19 ENCOUNTER — Other Ambulatory Visit (INDEPENDENT_AMBULATORY_CARE_PROVIDER_SITE_OTHER): Payer: Self-pay | Admitting: Pediatrics

## 2020-03-19 DIAGNOSIS — G40119 Localization-related (focal) (partial) symptomatic epilepsy and epileptic syndromes with simple partial seizures, intractable, without status epilepticus: Secondary | ICD-10-CM

## 2020-03-19 DIAGNOSIS — G40319 Generalized idiopathic epilepsy and epileptic syndromes, intractable, without status epilepticus: Secondary | ICD-10-CM

## 2020-05-01 ENCOUNTER — Other Ambulatory Visit: Payer: Self-pay

## 2020-05-01 ENCOUNTER — Encounter (INDEPENDENT_AMBULATORY_CARE_PROVIDER_SITE_OTHER): Payer: Self-pay | Admitting: Pediatrics

## 2020-05-01 ENCOUNTER — Ambulatory Visit (INDEPENDENT_AMBULATORY_CARE_PROVIDER_SITE_OTHER): Payer: Medicaid Other | Admitting: Pediatrics

## 2020-05-01 VITALS — BP 102/70 | HR 68 | Ht 62.0 in | Wt 122.6 lb

## 2020-05-01 DIAGNOSIS — G43009 Migraine without aura, not intractable, without status migrainosus: Secondary | ICD-10-CM

## 2020-05-01 DIAGNOSIS — F70 Mild intellectual disabilities: Secondary | ICD-10-CM | POA: Diagnosis not present

## 2020-05-01 DIAGNOSIS — G40119 Localization-related (focal) (partial) symptomatic epilepsy and epileptic syndromes with simple partial seizures, intractable, without status epilepticus: Secondary | ICD-10-CM | POA: Diagnosis not present

## 2020-05-01 DIAGNOSIS — E039 Hypothyroidism, unspecified: Secondary | ICD-10-CM

## 2020-05-01 DIAGNOSIS — E876 Hypokalemia: Secondary | ICD-10-CM

## 2020-05-01 DIAGNOSIS — G40319 Generalized idiopathic epilepsy and epileptic syndromes, intractable, without status epilepticus: Secondary | ICD-10-CM | POA: Diagnosis not present

## 2020-05-01 DIAGNOSIS — G4733 Obstructive sleep apnea (adult) (pediatric): Secondary | ICD-10-CM

## 2020-05-01 DIAGNOSIS — R2689 Other abnormalities of gait and mobility: Secondary | ICD-10-CM

## 2020-05-01 MED ORDER — DIAZEPAM 2 MG PO TABS: ORAL_TABLET | ORAL | 5 refills | Status: DC

## 2020-05-01 MED ORDER — LEVOTHYROXINE SODIUM 25 MCG PO TABS: 25.0000 ug | ORAL_TABLET | Freq: Every day | ORAL | 5 refills | Status: DC

## 2020-05-01 MED ORDER — VIMPAT 100 MG PO TABS: 1.0000 | ORAL_TABLET | Freq: Two times a day (BID) | ORAL | 5 refills | Status: DC

## 2020-05-01 NOTE — Patient Instructions (Signed)
Was a pleasure to see you today.  I am concerned by the generalized tonic-clonic seizures that he had but am heartened that there were no seizures where he stopped breathing.  I am also pleased that he is not having many migraines.  I refilled your prescription for Vimpat levothyroxine and diazepam.  I am not going to make changes in his medication for now.  We will plan to see him in 3 months.  At some point we are going to have to change the VNS.  Please mask, socially distance if you are not going to get yourselves vaccinated for Covid.

## 2020-05-01 NOTE — Progress Notes (Signed)
Patient: [name] MRN: [id number] Sex: male DOB: 04/21/1986  Provider: Wyline Copas, MD Location of Care: [facility]  Note type: Routine return visit  History of Present Illness: Referral Source: Anastasia Pall, MD History from: mother, patient and [facility] chart Chief Complaint: Epilepsy  [name] is a 34 y.o. male who returns May 01, 2020 for the first time since January 16, 2020.  [name] has intractable generalized tonic-clonic seizures that are likely secondarily generalized.  He has focal epilepsy with impairment of consciousness.  He has hypokalemia which I believe is likely related to renal tubular defect requiring potassium replacement.  His mother kept detailed records of his seizures.  On 8 occasions May 14, 25, June 7, 13, and 14, July 14,,17, and 30 he has had generalized tonic-clonic seizures lasting from 3 to 5 minutes in duration.  On May 18 he had a 2-minute complex partial seizure, on June 16, 23, and 29 he had 3 seizures that stopped abruptly after his VNS was stimulated with a magnet.  Unfortunately this does not happen more often.  Fortunately he has not had any seizures that were associated with apnea.  He had one migraine that was associated with severe headache pain nausea and vomiting.  His weight is stable.  He is up 2 pounds since his last visit with no change in height.  His general health has been good.  He has obstructive sleep apnea, but is unable to wear CPAP fortunately he is able to sleep fairly soundly at nighttime.  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  He is on polypharmacy for his seizures including carbamazepine, levetiracetam, [name], phenobarbital and Vimpat.  He takes Trokendi XR for his migraines.  He also has a vagal nerve stimulator that was interrogated today and will be described below.  In addition over  time he took Felbatol without benefit.  Depakote caused a severe systemic reaction.  Neurontin failed to control his seizures.  Attempts to taper or discontinue the current medicines he takes separately have resulted in increasing frequency and severity of seizures.  With time however his seizures are more frequent which is of great concern.  It has been a couple of years since he had an EEG at [facility]  We got a second opinion which has not proven to be helpful.  Though his parents and sister had fairly severe cases of Covid in December, 2020: Father was hospitalized, sister has increasing frequency of migraine headaches.  They are not planning on getting vaccinated despite the risk to all of them.  Procedure: Interrogation and reprogramming of Vagal Nerve Stimulator  Implantation of the current stimulator 104 serial # [id number] implanted 04/19/14   Interrogation of the vagal nerve stimulator: Voltage 2.25 mA. Frequency 20 Hz, pulse width 250 microseconds, signal time on30seconds, signal time off 1.1 minutes;magnet current 2.50 mA, magnet signal time on 60 seconds, magnet pulse width 250 microseconds.  The normal mode diagnostics using his current settings revealed the battery is18-25 %full, and communication, output, and impedance are good; 1639ohms. There is no indication change the battery, but the decline in capacity from 50% 3 months ago is of concern. There have been 7660 magnet stimulations since implantation, 3073 since January 16, 2020.  He tolerated the procedure well.  Review of Systems: A complete review of systems was remarkable for patient is here to be seen for epilepsy. Mom states that the  patient has had 8 gran mal seizurs since his last visit. She reports that the patient has had one petit mal, and three VNS seizures. She reports that hehas had one migraine associated with vomiting. She has no other concerns at this time,, all other systems reviewed and negative.  Past Medical  History Diagnosis Date  . ALLERGIC RHINITIS 12/26/2009  . Blood clot in vein 2015  . Brown's syndrome   . Complication of anesthesia    seizure after pac surgery at [facility] 2013 had to be intubated, no other anes issurs  . Growth disorder   . Headache    migraines  . Heart murmur    moderate MR ses dr Darcus Pester  . Hyperlipidemia    No therapy  . Hypokalemia   . Mental retardation, mild (I.Q. 50-70)   . MITRAL VALVE PROLAPSE 06/22/2007  . SEIZURE DISORDER 06/22/2007   last seizure 08-02-2019  . Sleep apnea    cpap , last sleep study 10/01/11  . Unspecified hypothyroidism 06/22/2007   Hospitalizations: No., Head Injury: No., Nervous System Infections: No., Immunizations up to date: Yes.    Copied from prior chart notes Diagnosis of developmental delay as a toddler.   EEG 11/07/86 was normal for gestational age.   Genetics evaluation short stature, prenatal onset, chromosomal study: 17 XY, fragile X syndrome negative, evaluation for MELAS and MERRF were negative.  Initial seizure 08/28/94 left locomotor with secondary generalization.   MRI of the brain 09/13/93: Scattered subcortical white matter lesions at the parietotemporal parieto-occipital junction is ischemic versus hamartomas.   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   Diagnosis attention deficit disorder inattentive type treated without success with Cylert April 1995  MRI brain 04/04/94 stable differential diagnosis hamartoma, vasculitis, ischemic, or embolic phenomenon.   Status epilepticus 01/31/95, and 04/15/95 Neurontin added to Tegretol   MRI brain 06/14/96 unchanged   Admission to [facility]. Tegretol 30 mcg for militate. 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   Topiramate started in May 1999 unable to be tolerated with Tegretol and was tapered and discontinued.  January 2000 EEG showed right greater than left mid temporal sharp waves was otherwise well-organized EEG. He hospitalizations in March, July, October, in November for recurrent seizures.  Patient placed on Depakote in April 2000 and developed gagging abdominal pain headache and had significant change in transaminases and liver functions. Topamax was restarted and Keppra was added.  Vimpat was started July 16, 2012 and adjusted upwards.   [name] was added on November 18, 2012 and has been adjusted upwards. Topamax was changed to Trokendi XR in attempt to deal with sleepiness, unsteadiness caused by polypharmacy. We to these medications have been introduced, the patient experiences somewhat improved seizure control however Is quite likely that despite better seizure control, the patient is experiencing impairment in cognition and gait as a result of polypharmacy .  MRI of the brain on March 21, 2013. The patient has extensive calcification to the basal ganglia bilaterally, normal ventricle size, and no acute findings. EKG was unchanged  Birth History He was a breech presentation, cesarean section delivery. He  had intrauterine growth retardation, and failure to thrive.  Behavior History Mild intellectual disability  Surgical History Procedure Laterality Date  . ANKLE SURGERY Left    fused does not bend, wears right leg brace  . BIOPSY  08/22/2019   Procedure: BIOPSY;  Surgeon: Mauri Pole, MD;  Location: [facility];  Service: Endoscopy;;  . COLONOSCOPY WITH PROPOFOL N/A 08/22/2019   Procedure: COLONOSCOPY WITH PROPOFOL;  Surgeon: Mauri Pole, MD;  Location: [facility];  Service: Endoscopy;  Laterality: N/A;  . EYE SURGERY     X2 for Brown Syndrome  . HEMOSTASIS CLIP PLACEMENT  08/22/2019   Procedure: HEMOSTASIS CLIP PLACEMENT;  Surgeon: Mauri Pole, MD;  Location: [facility];  Service: Endoscopy;;  . IMPLANTATION VAGAL NERVE STIMULATOR     Left leaves on for surgeries  . POLYPECTOMY  08/22/2019   Procedure: POLYPECTOMY;  Surgeon: Mauri Pole, MD;  Location: [facility];  Service: Endoscopy;;  . PORTACATH PLACEMENT  2013 and removed and new placed   right chest   Family History family history includes Breast cancer in his mother; Clotting disorder in his mother; Colon polyps in his father and mother; Congestive Heart Failure in his maternal grandfather; Coronary artery disease in his maternal grandfather and paternal grandfather; Diabetes in his father; Hyperlipidemia in his father; Hypertension in his father; Lung cancer in his paternal grandfather; Pneumonia in his maternal grandfather and paternal grandmother; Stroke in his paternal grandmother. Family history is negative for migraines, seizures, intellectual disabilities, blindness, deafness, birth defects, chromosomal disorder, or autism.  Social History Socioeconomic History  . Marital status: Single  . Years of education:  65  . Highest education level:  High school certificate  Occupational History  . Occupation: Disabled    Employer: UNEMPLOYED  Tobacco Use  . Smoking status: Never Smoker  . Smokeless tobacco: Never Used  Vaping Use  . Vaping Use: Never used  Substance and Sexual Activity  . Alcohol use: No    Alcohol/week: 0.0 standard drinks  . Drug use: No  . 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     He lives with his parents.     He enjoys helping around the house, Nascar, and football.   Allergies Allergen Reactions  .  Antihistamines, Chlorpheniramine-Type Other (See Comments)    Other Reaction: Dimetapp causes seizures  . Divalproex Sodium Other (See Comments)    Nausea, vomiting, liver and kidney dysfunction  . Phenytoin Rash and Other (See Comments)    Dilantin causes more seizures.   . Valproic Acid And Related Other (See Comments)    Shuts down systems  . Vancomycin Rash, Other (See Comments) and Swelling    If given too fast, causes red man reaction Other Reaction: Redman's Syndrome   Physical Exam BP 102/70   Pulse 68   Ht 5\' 2"  (1.575 m)   Wt 122 lb 9.6 oz (55.6 kg)   BMI 22.42 kg/m   General: alert, well developed, well nourished, in no acute distress, brown hair, hazel eyes, right handed Head: normocephalic, no dysmorphic features Ears, Nose and Throat: Otoscopic: tympanic membranes normal; pharynx: oropharynx is pink without exudates or tonsillar hypertrophy Neck: supple, diminished range of motion; head is tilted with the right ear toward his right shoulder and his chin pointed to the left and is swollen bilaterally, no cranial  or cervical bruits Respiratory: auscultation clear Cardiovascular: no murmurs, pulses are normal Musculoskeletal: no skeletal deformities or apparent scoliosis Skin: no rashes or neurocutaneous lesions  Neurologic Exam  Mental Status: alert; oriented to person; knowledge is below normal for age; language is adequate for conversation, following commands, and speech is intelligible Cranial Nerves: visual fields are full to double simultaneous stimuli; extraocular movements are full and conjugate; pupils are round reactive to light; funduscopic examination shows positive red reflex; symmetric facial strength; midline tongue; localizes sound bilaterally Motor: mild proximal weakness, normal tone and mass; good fine motor movements; no pronator drift Sensory: intact responses to cold, vibration, proprioception and stereognosis Coordination: good finger-to-nose;  rapid repetitive alternating movements and finger apposition are clumsy Gait and Station: broad-based, antalgic, diplegic gait and station; balance is poor; Romberg exam is negative Reflexes: symmetric and diminished bilaterally; no clonus  Assessment 1.  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 2.  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 3.  Obstructive sleep apnea, G47.33. 4.  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 5.  Mild intellectual disability, F70. 6.  Hypokalemia, E87.6.   7.  Migraine without aura, without status migrainosus, not intractable, G43.009. 8.  Hypothyroidism, E03.9.  Discussion [name] continues to have fairly frequent seizures.  His battery has declined in its capacity considerably in 3 months.  I realize that it was interrogated with a different wand and computer but that should not make a difference.  We will need to watch this closely because that device may need to be replaced.  It is now 33 years old.  Plan I refilled prescriptions for Vimpat, levothyroxine, and diazepam.  He will return to see me in 3 months I will see him sooner based on clinical need.  Greater than 50% of a 25-minute visit was spent in counseling and coordination of care concerning his seizures is multiple neurologic issues including migraines and gait disorder, and discussing the Covid vaccine.  In addition I interrogated and did not need to reprogram his vagal nerve stimulator.  It will be scanned into the chart.   Medication List   Accurate as of May 01, 2020 12:29 PM. If you have any questions, ask your nurse or doctor.    acetaminophen 500 MG tablet Commonly known as: TYLENOL Take 1,000 mg by mouth every 6 (six) hours as needed for headache.   albuterol 1.25 MG/3ML nebulizer solution Commonly known as: ACCUNEB Take 1 ampule by nebulization every 4 (four) hours as needed for wheezing.   carbamazepine 100 MG chewable tablet Commonly known as:  TEGRETOL CHEW 2 TABLETS IN THE MORNING, 2 TABLETS AT MIDDAY AND 1 & 1/2 TABLETS IN THE EVENING   cetirizine 10 MG tablet Commonly known as: ZYRTEC Take 10 mg by mouth daily.   Dextromethorphan-guaiFENesin 5-100 MG/5ML Liqd Take 10 mLs by mouth 2 (two) times daily as needed (cough).   diazepam 2 MG tablet Commonly known as: Valium Take 1 tablet every 6 - 8 hours as needed for anxiety or seizures   Durezol 0.05 % Emul Generic drug: Difluprednate Place 1 drop into the right eye 3 (three) times daily.   Flaxseed Oil 1000 MG Caps Take 1,000 mg by mouth daily.   gatifloxacin 0.5 % Soln Commonly known as: ZYMAXID Place 1 drop into the right eye 4 (four) times daily.   guaiFENesin 600 MG 12 hr tablet Commonly known as: MUCINEX Take 600 mg by mouth 2 (two) times daily.   K-Phos 500 MG tablet Generic drug: potassium phosphate (  monobasic) Take 1 tablet (500 mg total) by mouth 4 (four) times daily.   ketorolac 0.5 % ophthalmic solution Commonly known as: ACULAR Place 1 drop into the right eye 4 (four) times daily.   levETIRAcetam 500 MG tablet Commonly known as: KEPPRA Take 500 mg by mouth as directed. Takes 2 tablets in the morning and 2.5 tablets in the evening   levothyroxine 25 MCG tablet Commonly known as: SYNTHROID Take 25 mcg by mouth daily before breakfast.   LORazepam 2 MG/ML injection Commonly known as: ATIVAN Inject 2 mg into the vein See admin instructions. Dilute 2mg  of lorazepam with 42mL of saline.  Administer IV push over 3-5 minutes as needed for seizures.   montelukast 10 MG tablet Commonly known as: SINGULAIR Take 10 mg by mouth at bedtime.   multivitamin with minerals Tabs tablet Take 1 tablet by mouth daily.   Nayzilam 5 MG/0.1ML Soln Generic drug: Midazolam Take 5 mg after 2 minutes of seizures, may repeat in 10 minutes if seizures persist. What changed:   how much to take  how to take this  when to take this  additional instructions     Omega-3 500 MG Caps Take 500 mg by mouth daily.   ondansetron 4 MG disintegrating tablet Commonly known as: ZOFRAN-ODT Take 1 tablet as needed for nausea and vomiting What changed:   how much to take  how to take this  when to take this  reasons to take this  additional instructions   [name] 10 MG tablet Generic drug: cloBAZam TAKE 1 TABLET BY MOUTH IN THE MORNING, 1 TAB IN THE AFTERNOON, AND 2 TABS AT BEDTIME What changed:   how much to take  how to take this  when to take this  additional instructions   PHENObarbital 65 MG/ML injection Commonly known as: LUMINAL Withdraw 25ml (65mg ) Phenobarbital and 38ml Normal Saline into 35ml syringe. Adminster IV push for 3-5 minutes as needed for recurrent seizures What changed:   how much to take  how to take this  when to take this   potassium chloride SA 20 MEQ tablet Commonly known as: Klor-Con M20 Take 1 tablet (20 mEq total) by mouth daily.   promethazine 25 MG suppository Commonly known as: PHENERGAN Place 25 mg rectally every 6 (six) hours as needed for nausea or vomiting.   rosuvastatin 10 MG tablet Commonly known as: CRESTOR Take 10 mg by mouth at bedtime.   Trokendi XR 200 MG Cp24 Generic drug: Topiramate ER Take 200 mg by mouth at bedtime. Take 1 capsule (200mg )  by mouth at bedtime What changed: additional instructions   Trokendi XR 50 MG Cp24 Generic drug: Topiramate ER Take 1 capsule at nighttime What changed: Another medication with the same name was changed. Make sure you understand how and when to take each.   Vimpat 100 MG Tabs Generic drug: Lacosamide TAKE 1 TABLET BY MOUTH 2 TIMES DAILY    The medication list was reviewed and reconciled. All changes or newly prescribed medications were explained.  A complete medication list was provided to the patient/caregiver.  Jodi Geralds MD

## 2020-05-03 ENCOUNTER — Other Ambulatory Visit (INDEPENDENT_AMBULATORY_CARE_PROVIDER_SITE_OTHER): Payer: Self-pay | Admitting: Pediatrics

## 2020-05-03 DIAGNOSIS — E876 Hypokalemia: Secondary | ICD-10-CM

## 2020-06-06 ENCOUNTER — Other Ambulatory Visit (INDEPENDENT_AMBULATORY_CARE_PROVIDER_SITE_OTHER): Payer: Self-pay | Admitting: Pediatrics

## 2020-06-06 ENCOUNTER — Telehealth (INDEPENDENT_AMBULATORY_CARE_PROVIDER_SITE_OTHER): Payer: Self-pay | Admitting: Family

## 2020-06-06 DIAGNOSIS — G40319 Generalized idiopathic epilepsy and epileptic syndromes, intractable, without status epilepticus: Secondary | ICD-10-CM

## 2020-06-06 DIAGNOSIS — G40119 Localization-related (focal) (partial) symptomatic epilepsy and epileptic syndromes with simple partial seizures, intractable, without status epilepticus: Secondary | ICD-10-CM

## 2020-06-06 NOTE — Telephone Encounter (Signed)
Thank you taking the call, I do not think that we should make any changes in his current treatment.  Fortunately these things happen infrequently.

## 2020-06-06 NOTE — Telephone Encounter (Signed)
Mom DJ Minium contacted me to report that [name] had a seizure at 9:15AM this morning in which he stopped breathing and required rescue breathing for 5-6 minutes. 911 was called but [name] came back to baseline and he was not transported to ED. Mom notes that [name] complained of a migraine afterwards. Tylenol was given and he is resting now. TG

## 2020-07-12 ENCOUNTER — Telehealth (INDEPENDENT_AMBULATORY_CARE_PROVIDER_SITE_OTHER): Payer: Self-pay | Admitting: Pediatrics

## 2020-07-12 DIAGNOSIS — G40319 Generalized idiopathic epilepsy and epileptic syndromes, intractable, without status epilepticus: Secondary | ICD-10-CM

## 2020-07-12 DIAGNOSIS — G40401 Other generalized epilepsy and epileptic syndromes, not intractable, with status epilepticus: Secondary | ICD-10-CM

## 2020-07-12 MED ORDER — LORAZEPAM 2 MG/ML IJ SOLN: 2.0000 mg | INTRAMUSCULAR | 5 refills | Status: DC

## 2020-07-12 MED ORDER — PHENOBARBITAL SODIUM 65 MG/ML IJ SOLN: INTRAMUSCULAR | 5 refills | Status: DC

## 2020-07-12 NOTE — Telephone Encounter (Signed)
Prescriptions were ordered as requested.

## 2020-07-12 NOTE — Telephone Encounter (Signed)
Please send to the pharmacy °

## 2020-07-12 NOTE — Telephone Encounter (Signed)
  Who's calling (name and relationship to patient) : Jeani Hawking from [facility] contact number: [place] NUMBER [phone]  Provider they see: Dr. Gaynell Face  Reason for call: Pharmacy states that they are waiting for refill authorization. He notes that they have a new fax number for prescription refills.    PRESCRIPTION REFILL ONLY  Name of prescription: LORazepam (ATIVAN) 2 MG/ML injection  PHENObarbital (LUMINAL) 65 MG/ML injection  Pharmacy: [place], [place] - [place] NUMBER [phone]

## 2020-07-25 ENCOUNTER — Other Ambulatory Visit: Payer: Self-pay

## 2020-07-25 ENCOUNTER — Ambulatory Visit (INDEPENDENT_AMBULATORY_CARE_PROVIDER_SITE_OTHER): Payer: Medicaid Other | Admitting: Pediatrics

## 2020-07-25 ENCOUNTER — Encounter (INDEPENDENT_AMBULATORY_CARE_PROVIDER_SITE_OTHER): Payer: Self-pay | Admitting: Pediatrics

## 2020-07-25 VITALS — BP 112/72 | HR 92 | Ht 62.0 in | Wt 124.6 lb

## 2020-07-25 DIAGNOSIS — E876 Hypokalemia: Secondary | ICD-10-CM

## 2020-07-25 DIAGNOSIS — G40119 Localization-related (focal) (partial) symptomatic epilepsy and epileptic syndromes with simple partial seizures, intractable, without status epilepticus: Secondary | ICD-10-CM | POA: Diagnosis not present

## 2020-07-25 DIAGNOSIS — G40319 Generalized idiopathic epilepsy and epileptic syndromes, intractable, without status epilepticus: Secondary | ICD-10-CM | POA: Diagnosis not present

## 2020-07-25 DIAGNOSIS — G40901 Epilepsy, unspecified, not intractable, with status epilepticus: Secondary | ICD-10-CM | POA: Diagnosis not present

## 2020-07-25 DIAGNOSIS — G43009 Migraine without aura, not intractable, without status migrainosus: Secondary | ICD-10-CM | POA: Diagnosis not present

## 2020-07-25 DIAGNOSIS — H5060 Mechanical strabismus, unspecified: Secondary | ICD-10-CM

## 2020-07-25 DIAGNOSIS — F70 Mild intellectual disabilities: Secondary | ICD-10-CM

## 2020-07-25 MED ORDER — ONFI 10 MG PO TABS: ORAL_TABLET | ORAL | 5 refills | Status: DC

## 2020-07-25 MED ORDER — POTASSIUM CHLORIDE CRYS ER 20 MEQ PO TBCR: 20.0000 meq | EXTENDED_RELEASE_TABLET | Freq: Every day | ORAL | 5 refills | Status: DC

## 2020-07-25 MED ORDER — VIMPAT 100 MG PO TABS: 1.0000 | ORAL_TABLET | Freq: Two times a day (BID) | ORAL | 5 refills | Status: DC

## 2020-07-25 MED ORDER — TROKENDI XR 200 MG PO CP24: 200.0000 mg | ORAL_CAPSULE | Freq: Every day | ORAL | 5 refills | Status: DC

## 2020-07-25 NOTE — Patient Instructions (Signed)
It is a pleasure to see you.  [name] continues to have seizures that are a problem, but I am not certain how to do a better job in controlling them.  I will be interested to see what Dr. Marcia Brash has to say.  I will get up with Dr. Pamala Hurry to see if we can get the VNS replaced in December.  I would like to see you in about 3 months.

## 2020-07-25 NOTE — Progress Notes (Signed)
Patient: [name] MRN: [id number] Sex: male DOB: Dec 08, 1985  Provider: Wyline Copas, MD Location of Care: [facility]  Note type: Routine return visit  History of Present Illness: Referral Source: Anastasia Pall, MD History from: both parents, patient and [facility] chart Chief Complaint: Epilepsy  [name] is a 34 y.o. 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  Recently he experienced 2 days of migraine that were associated with severe head pain and nausea and vomiting.  This fortunately subsided.  His mother kept a detailed record of his seizures since his last visit.  He had 2 to 3-minute generalized tonic-clonic seizures on August 13 and 2 on August 16.  After the second seizure on the 16th his port was accessed and he was given phenobarbital and Ativan.  He had a seizure on August 21 that was stopped with stimulation of his vagal nerve stimulator.  In September he had 2 seizures that were able to be stopped with stimulating the VNS on September 1 and 6 he had a 3 to 4-minute generalized tonic-clonic seizure on September 4.  He had a 6 to 7-minute generalized tonic-clonic seizure on the 16th that was associated with apnea EMS was called.  Fortunately he recovered.  He had 2 days when he had complex partial seizures the 21st and 23rd 1 where he had 3 in 12 hours.  On that day the port was accessed and he was medicated with phenobarbital and Ativan.  In October on the 18th he had a 3 to 4-minute generalized tonic-clonic seizure and the second that lasted 4 to 5 minutes that was associated with apnea on the 31st.  On October 3 he had 2 complex partial seizures.  The port was accessed and he was medicated.  On the 19th he had a seizure that was  aborted by VNS stimulation.  November 1 and 2 he had seizures that were controlled with VNS stimulation.  The family has been given an oral airway which in combination with an oxygen mask seems to bring about a return of oxygenation and color to his face when he has apnea.  Overall his health is good.  No one in the family has contracted Covid since they all had it a year ago.  Father still has Covid antibodies.  No one has been vaccinated.  Procedure: Interrogation and reprogramming of Vagal Nerve Stimulator  Implantation of the current stimulator 104 serial # [id number] implanted 04/19/14   Interrogation of the vagal nerve stimulator: Voltage 2.25 mA. Frequency 20 Hz, pulse width 250 microseconds, signal time on30seconds, signal time off 1.1 minutes;magnet current 2.50 mA, magnet signal time on 60 seconds, magnet pulse width 250 microseconds.  The normal mode diagnostics using his current settings revealed the battery is8- 18 %full, and communication, output, and impedance are good; 1609ohms. There is indication change the battery, soon because the battery seems to be losing its charge very quickly having dropped from 50% about 6 months ago.There have been  9864 magnet stimulations since implantation, 2204 since August 10 , 2021. He tolerated the procedure well.  Review of Systems: A complete review of systems was remarkable for patient is here to be seen for epilepsy. Mom reports that the patient has had sixteen seizures since his last visit. She reports that six of  the seizures were stopped by the VNS. She reports that the patient has had a migraine since Sunday. She states that it just went away this morning. She has no other concerns at this time., all other systems reviewed and negative.  Past Medical History Diagnosis Date  . ALLERGIC RHINITIS 12/26/2009  . Blood clot in vein 2015  . Brown's syndrome   . Complication of anesthesia    seizure after pac surgery at [facility] 2013 had  to be intubated, no other anes issurs  . Growth disorder   . Headache    migraines  . Heart murmur    moderate MR ses dr Darcus Pester  . Hyperlipidemia    No therapy  . Hypokalemia   . Mental retardation, mild (I.Q. 50-70)   . MITRAL VALVE PROLAPSE 06/22/2007  . SEIZURE DISORDER 06/22/2007   last seizure 08-02-2019  . Sleep apnea    cpap , last sleep study 10/01/11  . Unspecified hypothyroidism 06/22/2007   Hospitalizations: No., Head Injury: No., Nervous System Infections: No., Immunizations up to date: Yes.    Copied from prior chart notes Diagnosis of developmental delay as a toddler.   EEG 11/07/86 was normal for gestational age.   Genetics evaluation short stature, prenatal onset, chromosomal study: 55 XY, fragile X syndrome negative, evaluation for MELAS and MERRF were negative.  Initial seizure 08/28/94 left locomotor with secondary generalization.   MRI of the brain 09/13/93: Scattered subcortical white matter lesions at the parietotemporal parieto-occipital junction is ischemic versus hamartomas.   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   Diagnosis attention deficit disorder inattentive type treated without success with Cylert April 1995  MRI brain 04/04/94 stable differential diagnosis hamartoma, vasculitis, ischemic, or embolic phenomenon.   Status epilepticus 01/31/95, and 04/15/95 Neurontin added to Tegretol   MRI brain 06/14/96 unchanged   Admission to [facility]. Tegretol 30 mcg for militate. 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   Topiramate started in May 1999 unable to be tolerated with Tegretol and was tapered and discontinued.  January 2000 EEG showed right greater than left mid temporal sharp waves was otherwise well-organized EEG. He hospitalizations in March, July, October, in November for recurrent seizures.  Patient placed on Depakote in April 2000 and developed gagging abdominal pain headache and had significant change in transaminases and liver functions. Topamax was restarted and Keppra was added.  Vimpat was started July 16, 2012 and adjusted upwards.   [name] was added on November 18, 2012 and has been adjusted upwards. Topamax was changed to Trokendi XR in attempt to deal with sleepiness, unsteadiness caused by polypharmacy. We to these medications have been introduced, the patient experiences somewhat improved seizure control however Is quite likely that despite better seizure control, the patient is experiencing impairment in cognition and gait as a result of polypharmacy .  MRI of the brain on March 21, 2013. The patient has extensive calcification to the basal ganglia bilaterally, normal ventricle size, and no acute findings. EKG was unchanged  Birth History He was a breech presentation, cesarean section delivery. He had intrauterine growth retardation, and failure to thrive.  Behavior History Mild intellectual disability  Surgical History Procedure Laterality Date  . ANKLE SURGERY Left    fused does not bend, wears right leg brace  . BIOPSY  08/22/2019   Procedure: BIOPSY;  Surgeon: Mauri Pole, MD;  Location: [facility];  Service: Endoscopy;;  . COLONOSCOPY WITH PROPOFOL N/A 08/22/2019   Procedure: COLONOSCOPY WITH PROPOFOL;  Surgeon: Mauri Pole, MD;  Location: [facility];  Service: Endoscopy;  Laterality: N/A;  . EYE SURGERY     X2 for Brown Syndrome  . HEMOSTASIS CLIP PLACEMENT  08/22/2019   Procedure: HEMOSTASIS CLIP PLACEMENT;  Surgeon: Mauri Pole, MD;  Location: [facility];  Service: Endoscopy;;  . IMPLANTATION VAGAL NERVE STIMULATOR     Left leaves on for surgeries  . POLYPECTOMY  08/22/2019   Procedure: POLYPECTOMY;  Surgeon: Mauri Pole, MD;  Location: [facility];  Service: Endoscopy;;  . PORTACATH PLACEMENT  2013 and removed and new placed   right chest   Family History family history includes Breast cancer in his mother; Clotting disorder in his mother; Colon polyps in his father and mother; Congestive Heart Failure in his maternal grandfather; Coronary artery disease in his maternal grandfather and paternal grandfather; Diabetes in his father; Hyperlipidemia in his father; Hypertension in his father; Lung cancer in his paternal grandfather; Pneumonia in his maternal grandfather and paternal grandmother; Stroke in his paternal grandmother. Family history is negative for migraines, seizures, intellectual disabilities, blindness, deafness, birth defects, chromosomal disorder, or autism.  Social History Socioeconomic History  . Marital status: Single  . Years of education:  23  . Highest education level:  High school certificate  Occupational History  . Occupation: Disabled    Employer: UNEMPLOYED  Tobacco Use  . Smoking status: Never Smoker  . Smokeless tobacco: Never Used  Vaping Use  . Vaping Use: Never used  Substance and Sexual Activity  . Alcohol use: No    Alcohol/week: 0.0 standard drinks  . Drug use: No  . 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     He lives with his parents.     He enjoys helping around the house, Nascar, and football.   Allergies Allergen Reactions  . Antihistamines, Chlorpheniramine-Type Other (See Comments)    Other Reaction: Dimetapp causes seizures  . Divalproex Sodium Other (See Comments)    Nausea, vomiting, liver and kidney dysfunction  .  Phenytoin Rash and Other (See Comments)    Dilantin causes more seizures.   . Valproic Acid And Related Other (See Comments)    Shuts down systems  . Vancomycin Rash, Other (See Comments) and Swelling    If given too fast, causes red man reaction Other Reaction: Redman's Syndrome   Physical Exam BP 112/72   Pulse 92   Ht 5\' 2"  (1.575 m)   Wt 124 lb 9.6 oz (56.5 kg)   BMI 22.79 kg/m   General: alert, well developed, well nourished, in no acute distress, brown hair, hazel eyes, right handed Head: normocephalic, no dysmorphic features Ears, Nose and Throat: Otoscopic: tympanic membranes normal; pharynx: oropharynx is pink without exudates or tonsillar hypertrophy Neck: supple, diminished range of motion, no cranial or cervical bruits; head is tilted with the right ear toward his right shoulder and his chin pointed to the left and is swollen bilaterally, no cranial or cervical bruits Respiratory:  auscultation clear Cardiovascular: no murmurs, pulses are normal; VNS is in the upper chest just below the clavicle Musculoskeletal: no skeletal deformities or apparent scoliosis; AFO right leg Skin: no rashes or neurocutaneous lesions  Neurologic Exam  Mental Status: alert; oriented to person; knowledge is below normal for age; language is adequate for conversation and following commands, speech is intelligible but dysarthric Cranial Nerves: visual fields are full to double simultaneous stimuli; extraocular movements are full and conjugate; pupils are round reactive to light; funduscopic examination shows positive red reflex, he has severe photophobia; symmetric facial strength; midline tongue and uvula; air conduction is greater than bone conduction bilaterally Motor: Mild diffuse weakness proximally, normal tone and mass; clumsy fine motor movements; no pronator drift Sensory: intact responses to cold, vibration, proprioception and stereognosis Coordination: good finger-to-nose; rapid  repetitive alternating movements and finger apposition are clumsy Gait and Station: broad-based antalgic, diplegic gait and station: patient is able to walk on heels, toes and tandem without difficulty; balance is adequate; Romberg exam is negative; Gower response is negative Reflexes: symmetric and diminished bilaterally  Assessment 1. 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 2. 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 3. Obstructive sleep apnea, G47.33. 4. 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 5. Mild intellectual disability, F70. 6. Hypokalemia, E87.6.   7.  Migraine without aura, without status migrainosus, not intractable, G43.009. 8.  Hypothyroidism, E03.9.  Discussion [name] continues to have intractable seizures on multiple medications plus vagal nerve stimulator.  I am concerned at the rate of decline of the battery and believe that we should consider replacing it this calendar year, probably in December.  Plan I will send a copy of this note to Dr. Pamala Hurry at [facility] and request his opinion and assistance.  Greater than 50% of a 30-minute visit was spent in counseling and coordination of care concerning his seizures his episodes of apnea his vagal nerve stimulator, and interrogating it but not changing its parameters.  It is still heartening to know that at least some of his seizures can be aborted by swiping the device with a magnet.  He will return to see me in 3 months.  I understand that he will be seen by Dr. Mariane Baumgarten in the next couple of weeks if Dr. Marcia Brash other ideas about treatment and the VNS.  I did not change any of his medications.  I refilled those that would expire in the next 1 or 2 months.   Medication List   Accurate as of July 25, 2020 11:44 AM. If you have any questions, ask your nurse or doctor.    acetaminophen 500 MG tablet Commonly known as: TYLENOL Take 1,000 mg by mouth every 6 (six) hours as needed for  headache.   albuterol 1.25 MG/3ML nebulizer solution Commonly known as: ACCUNEB Take 1 ampule by nebulization every 4 (four) hours as needed for wheezing.   carbamazepine 100 MG chewable tablet Commonly known as: TEGRETOL CHEW 2 TABLETS IN THE MORNING, 2 TABLETS AT MIDDAY AND 1 & 1/2 TABLETS IN THE EVENING   cetirizine 10 MG tablet Commonly known as: ZYRTEC Take 10 mg by mouth daily.   Dextromethorphan-guaiFENesin 5-100 MG/5ML Liqd Take 10 mLs by mouth 2 (two) times daily as needed (cough).   diazepam 2 MG tablet Commonly known as: Valium Take 1 tablet every 6 - 8 hours as needed for anxiety or seizures   Durezol 0.05 % Emul Generic drug: Difluprednate Place 1 drop into the right eye 3 (three) times daily.  Flaxseed Oil 1000 MG Caps Take 1,000 mg by mouth daily.   gatifloxacin 0.5 % Soln Commonly known as: ZYMAXID Place 1 drop into the right eye 4 (four) times daily.   guaiFENesin 600 MG 12 hr tablet Commonly known as: MUCINEX Take 600 mg by mouth 2 (two) times daily.   K-Phos 500 MG tablet Generic drug: potassium phosphate (monobasic) TAKE 1 TABLET BY MOUTH FOUR TIMES A DAY   ketorolac 0.5 % ophthalmic solution Commonly known as: ACULAR Place 1 drop into the right eye 4 (four) times daily.   levETIRAcetam 500 MG tablet Commonly known as: KEPPRA Take 500 mg by mouth as directed. Takes 2 tablets in the morning and 2.5 tablets in the evening   levothyroxine 25 MCG tablet Commonly known as: SYNTHROID Take 1 tablet (25 mcg total) by mouth daily before breakfast.   LORazepam 2 MG/ML injection Commonly known as: ATIVAN Inject 1 mL (2 mg total) into the vein See admin instructions. Dilute 2mg  of lorazepam with 87mL of saline.  Administer IV push over 3-5 minutes as needed for seizures.   montelukast 10 MG tablet Commonly known as: SINGULAIR Take 10 mg by mouth at bedtime.   multivitamin with minerals Tabs tablet Take 1 tablet by mouth daily.   Nayzilam 5  MG/0.1ML Soln Generic drug: Midazolam Take 5 mg after 2 minutes of seizures, may repeat in 10 minutes if seizures persist. What changed:   how much to take  how to take this  when to take this  additional instructions   Omega-3 500 MG Caps Take 500 mg by mouth daily.   ondansetron 4 MG disintegrating tablet Commonly known as: ZOFRAN-ODT Take 1 tablet as needed for nausea and vomiting What changed:   how much to take  how to take this  when to take this  reasons to take this  additional instructions   [name] 10 MG tablet Generic drug: cloBAZam TAKE 1 TABLET BY MOUTH IN THE MORNING, 1 TAB IN THE AFTERNOON, AND 2 TABS AT BEDTIME What changed:   how much to take  how to take this  when to take this  additional instructions   PHENObarbital 65 MG/ML injection Commonly known as: LUMINAL Withdraw 39ml (65mg ) Phenobarbital and 34ml Normal Saline into 77ml syringe. Adminster IV push for 3-5 minutes as needed for recurrent seizures   potassium chloride SA 20 MEQ tablet Commonly known as: Klor-Con M20 Take 1 tablet (20 mEq total) by mouth daily.   promethazine 25 MG suppository Commonly known as: PHENERGAN Place 25 mg rectally every 6 (six) hours as needed for nausea or vomiting.   rosuvastatin 10 MG tablet Commonly known as: CRESTOR Take 10 mg by mouth at bedtime.   Trokendi XR 200 MG Cp24 Generic drug: Topiramate ER Take 200 mg by mouth at bedtime. Take 1 capsule (200mg )  by mouth at bedtime What changed: additional instructions   Trokendi XR 50 MG Cp24 Generic drug: Topiramate ER Take 1 capsule at nighttime What changed: Another medication with the same name was changed. Make sure you understand how and when to take each.   Vimpat 100 MG Tabs Generic drug: Lacosamide Take 1 tablet (100 mg total) by mouth 2 (two) times daily.    The medication list was reviewed and reconciled. All changes or newly prescribed medications were explained.  A complete medication  list was provided to the patient/caregiver.  Jodi Geralds MD

## 2020-07-29 ENCOUNTER — Other Ambulatory Visit (INDEPENDENT_AMBULATORY_CARE_PROVIDER_SITE_OTHER): Payer: Self-pay | Admitting: Pediatrics

## 2020-07-29 DIAGNOSIS — G40119 Localization-related (focal) (partial) symptomatic epilepsy and epileptic syndromes with simple partial seizures, intractable, without status epilepticus: Secondary | ICD-10-CM

## 2020-07-29 DIAGNOSIS — G40319 Generalized idiopathic epilepsy and epileptic syndromes, intractable, without status epilepticus: Secondary | ICD-10-CM

## 2020-07-31 NOTE — Telephone Encounter (Signed)
Please send to the pharmacy °

## 2020-08-01 ENCOUNTER — Ambulatory Visit (INDEPENDENT_AMBULATORY_CARE_PROVIDER_SITE_OTHER): Payer: Medicaid Other | Admitting: Pediatrics

## 2020-10-22 ENCOUNTER — Other Ambulatory Visit (INDEPENDENT_AMBULATORY_CARE_PROVIDER_SITE_OTHER): Payer: Self-pay | Admitting: Pediatrics

## 2020-10-22 DIAGNOSIS — G40319 Generalized idiopathic epilepsy and epileptic syndromes, intractable, without status epilepticus: Secondary | ICD-10-CM

## 2020-10-22 DIAGNOSIS — G40119 Localization-related (focal) (partial) symptomatic epilepsy and epileptic syndromes with simple partial seizures, intractable, without status epilepticus: Secondary | ICD-10-CM

## 2020-11-01 ENCOUNTER — Encounter (INDEPENDENT_AMBULATORY_CARE_PROVIDER_SITE_OTHER): Payer: Self-pay | Admitting: Pediatrics

## 2020-11-01 ENCOUNTER — Other Ambulatory Visit: Payer: Self-pay

## 2020-11-01 ENCOUNTER — Ambulatory Visit (INDEPENDENT_AMBULATORY_CARE_PROVIDER_SITE_OTHER): Payer: Medicaid Other | Admitting: Pediatrics

## 2020-11-01 VITALS — BP 90/68 | HR 60 | Ht 62.0 in | Wt 122.8 lb

## 2020-11-01 DIAGNOSIS — G40319 Generalized idiopathic epilepsy and epileptic syndromes, intractable, without status epilepticus: Secondary | ICD-10-CM | POA: Diagnosis not present

## 2020-11-01 DIAGNOSIS — F70 Mild intellectual disabilities: Secondary | ICD-10-CM | POA: Diagnosis not present

## 2020-11-01 DIAGNOSIS — G40119 Localization-related (focal) (partial) symptomatic epilepsy and epileptic syndromes with simple partial seizures, intractable, without status epilepticus: Secondary | ICD-10-CM | POA: Diagnosis not present

## 2020-11-01 DIAGNOSIS — G43701 Chronic migraine without aura, not intractable, with status migrainosus: Secondary | ICD-10-CM | POA: Diagnosis not present

## 2020-11-01 DIAGNOSIS — G43009 Migraine without aura, not intractable, without status migrainosus: Secondary | ICD-10-CM

## 2020-11-01 DIAGNOSIS — R2689 Other abnormalities of gait and mobility: Secondary | ICD-10-CM

## 2020-11-01 MED ORDER — NURTEC 75 MG PO TBDP: ORAL_TABLET | ORAL | 5 refills | Status: DC

## 2020-11-01 NOTE — Patient Instructions (Signed)
It was a pleasure to see you today.  It seems that the frequency of seizures has declined with implantation of the new battery for your VNS.  Do not know why that would be.  I understand that you will be seeing Dr. Marcia Brash at [facility] and that he may want to change some of [name] antiepileptic medicines.  I have no problem with that as long as we keep contact with each other so that if it does not work as we hoped, that I will know what he is doing.  Long-term I think that you need to consider whether or not it might be prudent to have him follow [name] long-term after I retire.  We will be happy to see and continue to take care of [name].  My partner Dr. Franco Nones has trained in epilepsy and is more than able to take my place.  I like to see [name] in 4 months.  We may have a better idea about how we will approach the future at that time.  Please let me know if there is anything I can do to help between now when I see you again.

## 2020-11-01 NOTE — Progress Notes (Signed)
Patient: [name] MRN: [id number] Sex: male DOB: Jul 26, 1986  Provider: Wyline Copas, MD Location of Care: [facility]  Note type: Routine return visit  History of Present Illness: Referral Source: Anastasia Pall, MD History from: both parents, patient and [facility] chart Chief Complaint: VNS/Epilepsy/Migraines  [name] is a 35 y.o. male who was evaluated November 01, 2020 for the first time since July 25, 2020.  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  Some of his seizures can be immediately stopped by swiping the vagal nerve stimulator with a magnet.  Over the past 3 months there have been 10 seizures in total.  His vagal nerve stimulator battery was changed on January 6 by Dr. Dois Davenport.  He has had 2 generalized tonic-clonic and 2 complex partial seizures.  There have been 4 seizures stopped with the VNS.  2 of his seizures were associated with apnea.  Migraines were quite active in January.  He had one beginning January 7 that lasted for 3 days and another in January 12 that lasted for 3 days.  This was refractory to his abortive medications.  He also had a 2-day migraine on the 19th and 20th.  There have been no migraines in the last 22 days.  [name] has experienced seizures since he was 35 years of age.  He is on polypharmacy with carbamazepine, [name], Vimpat, levetiracetam.  If he has prolonged seizures, we have found that treating him with a combination of IV phenobarbital plus Ativan given through a central port stops his seizures.  He also has Nayzilam to take as an abortive.  That works sometimes when the stimulating vagal nerves stimulator fails.  He has been followed also by Dr. Mariane Baumgarten at [facility] he is scheduled to be seen in late March.  The family was informed that Dr. Marcia Brash might want  to adjust his medications to take advantage of medicines that have been more recently introduced and simplify his regimen.  I am all for this.  The one thing that I thought is that we have communication between his office line and the family because if there are any immediate problems, will be coming to [facility] and not there is.  Long-term it might make most sense for him to be seen at [facility] for his seizures.  I will let the family make a decision with Dr. Marcia Brash.  I am more than happy to have him seen long-term here in our office by my colleague Dr. Franco Nones who has an epilepsy fellowship and is extremely skilled in treatment of seizures.  I think that we can either continue things as they have been working cooperatively with [facility].  It is important however that we only have 1 person changing medication at the time.  I interrogated his vagal nerve stimulator today and results are as follows:  Procedure: Interrogation and reprogramming of Vagal Nerve Stimulator  Implantation of the current stimulator 104 serial # [id number] implanted 09/27/2020  Interrogation of the vagal nerve stimulator: Voltage 2.25 mA. Frequency 20 Hz, pulse width 250 microseconds, signal time on30seconds, signal time off 1.1 minutes;magnet current 2.50 mA, magnet signal time on 60 seconds, magnet pulse width 250 microseconds.  Duty cycle 35%.  I interrogated but did not reprogram the device  The normal mode diagnostics using his current settings revealed the battery is75-100%full, and communication, output, and impedance are  good; 1601ohms.There have been148magnet stimulations since implantation. This is a surprisingly high number for 1 month.  He tolerated the procedure well.  His general health is good.  His sister's family contracted Covid but nobody in his family is contracted COVID in over a year.  None are vaccinated.  His family has noted that he is having some increased difficulty with his gait and  balance.  This seems to be when he is wearing his right AFO brace.  I did not notice any changes in his gait.  Review of Systems: A complete review of systems was remarkable for patient is here to be seen for epilepsy and migraines. Mom reports that the patient has had ten seizures since his last visit. She reports that he had two gran mal seizures, two petit mal seizures, two seizures that caused him to stop breathing, and four VNS seizures. She also reports that EMS was called for two of the seizures. She reports that he has had a few migraines since his last visit. She states that the migraines last three days at a time. She reports no other concerns at this time., all other systems reviewed and negative.  Past Medical History Diagnosis Date  . ALLERGIC RHINITIS 12/26/2009  . Blood clot in vein 2015  . Brown's syndrome   . Complication of anesthesia    seizure after pac surgery at [facility] 2013 had to be intubated, no other anes issurs  . Growth disorder   . Headache    migraines  . Heart murmur    moderate MR ses dr Darcus Pester  . Hyperlipidemia    No therapy  . Hypokalemia   . Mental retardation, mild (I.Q. 50-70)   . MITRAL VALVE PROLAPSE 06/22/2007  . SEIZURE DISORDER 06/22/2007   last seizure 08-02-2019  . Sleep apnea    cpap , last sleep study 10/01/11  . Unspecified hypothyroidism 06/22/2007   Hospitalizations: No., Head Injury: No., Nervous System Infections: No., Immunizations up to date: Yes.    Copied from prior chartnotes Diagnosis of developmental delay as a toddler.   EEG 11/07/86 was normal for gestational age.   Genetics evaluation short stature, prenatal onset, chromosomal study: 51 XY, fragile X syndrome negative, evaluation for MELAS and MERRF were negative.  Initial seizure 08/28/94 left locomotor with secondary generalization.   MRI of the brain 09/13/93: Scattered subcortical white matter lesions at the parietotemporal parieto-occipital junction is ischemic  versus hamartomas.   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   Diagnosis attention deficit disorder inattentive type treated without success with Cylert April 1995  MRI brain 04/04/94 stable differential diagnosis hamartoma, vasculitis, ischemic, or embolic phenomenon.   Status epilepticus 01/31/95, and 04/15/95 Neurontin added to Tegretol   MRI brain 06/14/96 unchanged   Admission to [facility]. Tegretol 30 mcg for militate. 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   Topiramate started in May 1999 unable to be tolerated with Tegretol and was tapered and discontinued.  January 2000 EEG showed right greater than left mid temporal sharp waves was otherwise well-organized EEG. He hospitalizations in March, July, October, in November for recurrent  seizures.  Patient placed on Depakote in April 2000 and developed gagging abdominal pain headache and had significant change in transaminases and liver functions. Topamax was restarted and Keppra was added.  Vimpat was started July 16, 2012 and adjusted upwards.   [name] was added on November 18, 2012 and has been adjusted upwards. Topamax was changed to Trokendi XR in attempt to deal with sleepiness, unsteadiness caused by polypharmacy. We to these medications have been introduced, the patient experiences somewhat improved seizure control however Is quite likely that despite better seizure control, the patient is experiencing impairment in cognition and gait as a result of polypharmacy .  MRI of the brain on  March 21, 2013. The patient has extensive calcification to the basal ganglia bilaterally, normal ventricle size, and no acute findings. EKG was unchanged  Birth History He was a breech presentation, cesarean section delivery. He had intrauterine growth retardation, and failure to thrive.  Behavior History Mild intellectual disability  Surgical History Procedure Laterality Date  . ANKLE SURGERY Left    fused does not bend, wears right leg brace  . BIOPSY  08/22/2019   Procedure: BIOPSY;  Surgeon: Mauri Pole, MD;  Location: [facility];  Service: Endoscopy;;  . COLONOSCOPY WITH PROPOFOL N/A 08/22/2019   Procedure: COLONOSCOPY WITH PROPOFOL;  Surgeon: Mauri Pole, MD;  Location: [facility];  Service: Endoscopy;  Laterality: N/A;  . EYE SURGERY     X2 for Brown Syndrome  . HEMOSTASIS CLIP PLACEMENT  08/22/2019   Procedure: HEMOSTASIS CLIP PLACEMENT;  Surgeon: Mauri Pole, MD;  Location: [facility];  Service: Endoscopy;;  . IMPLANTATION VAGAL NERVE STIMULATOR     Left leaves on for surgeries  . POLYPECTOMY  08/22/2019   Procedure: POLYPECTOMY;  Surgeon: Mauri Pole, MD;  Location: [facility];  Service: Endoscopy;;  . PORTACATH PLACEMENT  2013 and removed and new placed   right chest    Revision of vagal nerve stimulator September 27, 2020   Family History family history includes Breast cancer in his mother; Clotting disorder in his mother; Colon polyps in his father and mother; Congestive Heart Failure in his maternal grandfather; Coronary artery disease in his maternal grandfather and paternal grandfather; Diabetes in his father; Hyperlipidemia in his father; Hypertension in his father; Lung cancer in his paternal grandfather; Pneumonia in his maternal grandfather and paternal grandmother; Stroke in his paternal grandmother.  Migraines in his sister Family history is negative for migraines, seizures, intellectual disabilities, blindness, deafness,  birth defects, chromosomal disorder, or autism.  Social History Socioeconomic History  . Marital status: Single  . Years of education: Not on file  . Highest education level: Not on file  Occupational History  . Occupation: Disabled    Employer: UNEMPLOYED  Tobacco Use  . Smoking status: Never Smoker  . Smokeless tobacco: Never Used  Vaping Use  . Vaping Use: Never used  Substance and Sexual Activity  . Alcohol use: No    Alcohol/week: 0.0 standard drinks  . Drug use: No  . 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     He lives with his parents.     He enjoys helping around the house, Nascar, and football.   Allergies Allergen Reactions  . Antihistamines, Chlorpheniramine-Type Other (See Comments)    Other Reaction: Dimetapp causes seizures  . Divalproex Sodium Other (See  Comments)    Nausea, vomiting, liver and kidney dysfunction  . Phenytoin Rash and Other (See Comments)    Dilantin causes more seizures.   . Valproic Acid And Related Other (See Comments)    Shuts down systems  . Vancomycin Rash, Other (See Comments) and Swelling    If given too fast, causes red man reaction Other Reaction: Redman's Syndrome   Physical Exam BP 90/68   Pulse 60   Ht 5\' 2"  (1.575 m)   Wt 122 lb 12.8 oz (55.7 kg)   BMI 22.46 kg/m   General: alert, well developed, well nourished, in no acute distress, brown hair, hazel eyes, right handed Head: normocephalic, no dysmorphic features Ears, Nose and Throat: Otoscopic: tympanic membranes normal; pharynx: oropharynx is pink without exudates or tonsillar hypertrophy Neck: supple, diminished range of motion, no cranial or cervical bruits; head is tilted with the right ear toward his right shoulder and his chin pointed to the left and is swollen bilaterally, no cranial or cervical bruits Respiratory: auscultation  clear Cardiovascular: no murmurs, pulses are normal Musculoskeletal: no skeletal deformities or apparent scoliosis; AFO right leg Skin: no rashes or neurocutaneous lesions  Neurologic Exam  Mental Status: alert; oriented to person, place and year; knowledge is below normal for age; language is adequate for conversation and following commands; speech is dysarthric but intelligible Cranial Nerves: visual fields are full to double simultaneous stimuli; extraocular movements are full and conjugate; pupils are round reactive to light; funduscopic examination shows positive red reflex he has severe photophobia; symmetric facial strength; midline tongue and uvula; air conduction is greater than bone conduction bilaterally Motor: Mild diffuse weakness, normal tone and mass; clumsy fine motor movements; no pronator drift Sensory: intact responses to cold, vibration, proprioception and stereognosis Coordination: good finger-to-nose, clumsy rapid repetitive alternating movements and finger apposition Gait and Station: Broad-based, antalgic, diplegic gait and station; today it seemed that he was not moving his right arm as much as the left it was held extended at his side;: patient is able to tandem with slight difficulty; balance is fair; Romberg exam is negative Reflexes: symmetric and diminished bilaterally  Assessment 1. 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 2. 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 3. Obstructive sleep apnea, G47.33. 4. 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 5. Mild intellectual disability, F70. 6. Hypokalemia, E87.6. 7. Migraine without aura, without status migrainosus, not intractable, G43.009. 8. Hypothyroidism, E03.9.  Discussion [name] is medically and neurologically stable.  The frequency of his seizures seems to be somewhat less the frequency of his migraines seems to be somewhat more.  Plan I am not going to change  Trokendi which were using for his migraines.  I do not think he will tolerate a higher dose.  We will try him on Nurtec to see if we can knock out his headaches when they occur.  This is likely going to need prior authorization.  Did not make any changes in his antiepileptic medications.  Site mentioned on not opposed to Dr. Marcia Brash trying new medications to see if we can simplify his drug regimen.  Every time I have tried to make changes with his prescription medications, his seizures have gotten worse.  It would be great to get him off some of the medicines that are probably making him more sleepy and mentally sluggish.  I will make a copy of this and mail it to Dr. Marcia Brash.  He can see this in Care Everywhere but I want to make certain that we  are communicating intentionally.  I only want one of [place] to make changes in his medications at a time.  The family understands that next time we change the vagal nerve stimulator, we may need to cut off his 2 leads in place a single lead on the vagus nerve so that he can take advantage of the newer models.  Family is aware that I will retire June 21, 2021.  We have to figure out how were going to deal with transition care for him.  Greater than 50% of a 30-minute visit was spent in counseling coordination of care for his multiple neurologic problems.  I also interrogated but did not reprogram his new vagal nerve stimulator.  He will return to see me in 4 months' time.   Medication List   Accurate as of November 01, 2020  8:20 AM. If you have any questions, ask your nurse or doctor.    acetaminophen 500 MG tablet Commonly known as: TYLENOL Take 1,000 mg by mouth every 6 (six) hours as needed for headache.   albuterol 1.25 MG/3ML nebulizer solution Commonly known as: ACCUNEB Take 1 ampule by nebulization every 4 (four) hours as needed for wheezing.   carbamazepine 100 MG chewable tablet Commonly known as: TEGRETOL CHEW 2 TABLETS IN THE MORNING, 2  TABLETS AT MIDDAY AND 1 & 1/2 TABLETS IN THE EVENING   cetirizine 10 MG tablet Commonly known as: ZYRTEC Take 10 mg by mouth daily.   Dextromethorphan-guaiFENesin 5-100 MG/5ML Liqd Take 10 mLs by mouth 2 (two) times daily as needed (cough).   diazepam 2 MG tablet Commonly known as: Valium Take 1 tablet every 6 - 8 hours as needed for anxiety or seizures   Durezol 0.05 % Emul Generic drug: Difluprednate Place 1 drop into the right eye 3 (three) times daily.   Flaxseed Oil 1000 MG Caps Take 1,000 mg by mouth daily.   gatifloxacin 0.5 % Soln Commonly known as: ZYMAXID Place 1 drop into the right eye 4 (four) times daily.   guaiFENesin 600 MG 12 hr tablet Commonly known as: MUCINEX Take 600 mg by mouth 2 (two) times daily.   K-Phos 500 MG tablet Generic drug: potassium phosphate (monobasic) TAKE 1 TABLET BY MOUTH FOUR TIMES A DAY   ketorolac 0.5 % ophthalmic solution Commonly known as: ACULAR Place 1 drop into the right eye 4 (four) times daily.   levETIRAcetam 500 MG tablet Commonly known as: KEPPRA Take 500 mg by mouth as directed. Takes 2 tablets in the morning and 2.5 tablets in the evening   levothyroxine 25 MCG tablet Commonly known as: SYNTHROID Take 1 tablet (25 mcg total) by mouth daily before breakfast.   LORazepam 2 MG/ML injection Commonly known as: ATIVAN Inject 1 mL (2 mg total) into the vein See admin instructions. Dilute 2mg  of lorazepam with 96mL of saline.  Administer IV push over 3-5 minutes as needed for seizures.   montelukast 10 MG tablet Commonly known as: SINGULAIR Take 10 mg by mouth at bedtime.   multivitamin with minerals Tabs tablet Take 1 tablet by mouth daily.   Nayzilam 5 MG/0.1ML Soln Generic drug: Midazolam Take 5 mg after 2 minutes of seizures, may repeat in 10 minutes if seizures persist. What changed:   how much to take  how to take this  when to take this  additional instructions   Omega-3 500 MG Caps Take 500 mg by  mouth daily.   ondansetron 4 MG disintegrating tablet Commonly known as: ZOFRAN-ODT  Take 1 tablet as needed for nausea and vomiting What changed:   how much to take  how to take this  when to take this  reasons to take this  additional instructions   [name] 10 MG tablet Generic drug: cloBAZam TAKE 1 TABLET BY MOUTH IN THE MORNING, 1 TAB IN THE AFTERNOON, AND 2 TABS AT BEDTIME   PHENObarbital 65 MG/ML injection Commonly known as: LUMINAL Withdraw 42ml (65mg ) Phenobarbital and 9ml Normal Saline into 20ml syringe. Adminster IV push for 3-5 minutes as needed for recurrent seizures   potassium chloride SA 20 MEQ tablet Commonly known as: Klor-Con M20 Take 1 tablet (20 mEq total) by mouth daily.   promethazine 25 MG suppository Commonly known as: PHENERGAN Place 25 mg rectally every 6 (six) hours as needed for nausea or vomiting.   rosuvastatin 10 MG tablet Commonly known as: CRESTOR Take 10 mg by mouth at bedtime.   Trokendi XR 50 MG Cp24 Generic drug: Topiramate ER Take 1 capsule at nighttime   Trokendi XR 200 MG Cp24 Generic drug: Topiramate ER TAKE 1 CAPSULE BY MOUTH AT BEDTIME   Vimpat 100 MG Tabs Generic drug: Lacosamide Take 1 tablet (100 mg total) by mouth 2 (two) times daily.    The medication list was reviewed and reconciled. All changes or newly prescribed medications were explained.  A complete medication list was provided to the patient/caregiver.  Jodi Geralds MD

## 2020-11-20 ENCOUNTER — Telehealth (INDEPENDENT_AMBULATORY_CARE_PROVIDER_SITE_OTHER): Payer: Self-pay | Admitting: Family

## 2020-11-20 DIAGNOSIS — G43009 Migraine without aura, not intractable, without status migrainosus: Secondary | ICD-10-CM

## 2020-11-20 DIAGNOSIS — G43701 Chronic migraine without aura, not intractable, with status migrainosus: Secondary | ICD-10-CM

## 2020-11-20 NOTE — Telephone Encounter (Signed)
The PA is still pending with Medicaid. TG

## 2020-11-20 NOTE — Telephone Encounter (Signed)
I entered the PA for Nurtec with Medicaid and it is pending at this time. I will check to see if it has been approved later today. TG

## 2020-11-20 NOTE — Telephone Encounter (Signed)
I planned to start him on Nurtec.  Can you look into this and see if we can prescribe it to him?

## 2020-11-20 NOTE — Telephone Encounter (Signed)
I received the following email from [name]' mother:  [name] had a "Stopped Breathing Seizure" at 7:55am. Merrilee Seashore did rescue breathing until he started breathing about 5 min. EMS came and checked him out. He is very tired but fine now. He started getting a migraine shortly after. We gave him Tylenol and is feeling better now. I wanted to keep you and Dr. Gaynell Face informed. He sees Dr. Lisabeth Devoid at [facility] on March 21[facility]. I'll make sure Dr. Lisabeth Devoid informs Dr. Gaynell Face of potential medicine changes that we are anticipating.  Also, Dr. Gaynell Face was going to get a migraine medicine pre approved for [facility]. Is there any word on that?  Thanks,  DJ  Please let me know if I can help.  Otila Kluver

## 2020-11-21 MED ORDER — NURTEC 75 MG PO TBDP: ORAL_TABLET | ORAL | 5 refills | Status: DC

## 2020-11-21 NOTE — Telephone Encounter (Signed)
Medicaid approved Nurtec. I let Mom know and sent updated Rx to the pharmacy. TG

## 2020-12-21 ENCOUNTER — Telehealth (INDEPENDENT_AMBULATORY_CARE_PROVIDER_SITE_OTHER): Payer: Self-pay | Admitting: Pediatrics

## 2020-12-21 DIAGNOSIS — G40319 Generalized idiopathic epilepsy and epileptic syndromes, intractable, without status epilepticus: Secondary | ICD-10-CM

## 2020-12-21 DIAGNOSIS — G40401 Other generalized epilepsy and epileptic syndromes, not intractable, with status epilepticus: Secondary | ICD-10-CM

## 2020-12-21 NOTE — Telephone Encounter (Signed)
Please send to the pharmacy °

## 2020-12-21 NOTE — Telephone Encounter (Signed)
  Who's calling (name and relationship to patient) : Jeani Hawking from [facility]  Best contact number: [phone]  Provider they see: Dr. Gaynell Face  Reason for call: Needs refills sent to pharmacy.    PRESCRIPTION REFILL ONLY  Name of prescription: LORazepam (ATIVAN) 2 MG/ML injection  PHENObarbital (LUMINAL) 65 MG/ML injection  Pharmacy: [facility], [address]

## 2020-12-21 NOTE — Telephone Encounter (Signed)
I spoke with infusion companey who called asking for prescription.   [name] or Dr [name] are not here today. It seems [name] has another neurologist at [facility]. [name] was seen by his neurologist on December 10, 2020 who increased vimpat doses gradually to 100 in am and 200 mg in pm.   Please any request for ativan or phenobarbital should go to his neurologist. It is unsafe to request medications injection without letting his doctor know at [place].   Franco Nones, MD

## 2021-01-24 ENCOUNTER — Telehealth (INDEPENDENT_AMBULATORY_CARE_PROVIDER_SITE_OTHER): Payer: Self-pay | Admitting: Family

## 2021-01-24 DIAGNOSIS — G4733 Obstructive sleep apnea (adult) (pediatric): Secondary | ICD-10-CM

## 2021-01-24 NOTE — Telephone Encounter (Signed)
Mom called to report that she was having trouble reordering [name]' CPAP supplies from [employer]. I called [employer] and was told a new order was needed. I faxed new order to [phone]. TG

## 2021-01-25 ENCOUNTER — Other Ambulatory Visit (INDEPENDENT_AMBULATORY_CARE_PROVIDER_SITE_OTHER): Payer: Self-pay | Admitting: Pediatrics

## 2021-01-25 DIAGNOSIS — E876 Hypokalemia: Secondary | ICD-10-CM

## 2021-01-27 ENCOUNTER — Encounter (INDEPENDENT_AMBULATORY_CARE_PROVIDER_SITE_OTHER): Payer: Self-pay

## 2021-01-29 ENCOUNTER — Encounter (INDEPENDENT_AMBULATORY_CARE_PROVIDER_SITE_OTHER): Payer: Self-pay | Admitting: *Deleted

## 2021-02-04 ENCOUNTER — Other Ambulatory Visit (INDEPENDENT_AMBULATORY_CARE_PROVIDER_SITE_OTHER): Payer: Self-pay | Admitting: Pediatrics

## 2021-02-04 ENCOUNTER — Other Ambulatory Visit (INDEPENDENT_AMBULATORY_CARE_PROVIDER_SITE_OTHER): Payer: Self-pay | Admitting: Family

## 2021-02-04 DIAGNOSIS — G40119 Localization-related (focal) (partial) symptomatic epilepsy and epileptic syndromes with simple partial seizures, intractable, without status epilepticus: Secondary | ICD-10-CM

## 2021-02-04 DIAGNOSIS — G40319 Generalized idiopathic epilepsy and epileptic syndromes, intractable, without status epilepticus: Secondary | ICD-10-CM

## 2021-02-04 MED ORDER — ONFI 10 MG PO TABS
ORAL_TABLET | ORAL | 5 refills | Status: DC
Start: 2021-02-04 — End: 2021-06-03

## 2021-02-05 NOTE — Telephone Encounter (Signed)
Please send to the pharmacy °

## 2021-03-06 ENCOUNTER — Other Ambulatory Visit: Payer: Self-pay

## 2021-03-06 ENCOUNTER — Ambulatory Visit (INDEPENDENT_AMBULATORY_CARE_PROVIDER_SITE_OTHER): Payer: Medicaid Other | Admitting: Pediatrics

## 2021-03-06 ENCOUNTER — Encounter (INDEPENDENT_AMBULATORY_CARE_PROVIDER_SITE_OTHER): Payer: Self-pay | Admitting: Pediatrics

## 2021-03-06 VITALS — BP 110/70 | HR 76 | Ht 62.0 in | Wt 125.6 lb

## 2021-03-06 DIAGNOSIS — G40119 Localization-related (focal) (partial) symptomatic epilepsy and epileptic syndromes with simple partial seizures, intractable, without status epilepticus: Secondary | ICD-10-CM

## 2021-03-06 DIAGNOSIS — E039 Hypothyroidism, unspecified: Secondary | ICD-10-CM

## 2021-03-06 DIAGNOSIS — R2689 Other abnormalities of gait and mobility: Secondary | ICD-10-CM

## 2021-03-06 DIAGNOSIS — G43009 Migraine without aura, not intractable, without status migrainosus: Secondary | ICD-10-CM | POA: Diagnosis not present

## 2021-03-06 DIAGNOSIS — F70 Mild intellectual disabilities: Secondary | ICD-10-CM

## 2021-03-06 DIAGNOSIS — E876 Hypokalemia: Secondary | ICD-10-CM

## 2021-03-06 DIAGNOSIS — G40319 Generalized idiopathic epilepsy and epileptic syndromes, intractable, without status epilepticus: Secondary | ICD-10-CM

## 2021-03-06 MED ORDER — POTASSIUM CHLORIDE CRYS ER 20 MEQ PO TBCR
20.0000 meq | EXTENDED_RELEASE_TABLET | Freq: Every day | ORAL | 5 refills | Status: DC
Start: 2021-03-06 — End: 2022-01-21

## 2021-03-06 MED ORDER — LEVOTHYROXINE SODIUM 25 MCG PO TABS: 25.0000 ug | ORAL_TABLET | Freq: Every day | ORAL | 5 refills | Status: DC

## 2021-03-06 MED ORDER — CARBAMAZEPINE 100 MG PO CHEW: CHEWABLE_TABLET | ORAL | 5 refills | Status: DC

## 2021-03-06 MED ORDER — K-PHOS 500 MG PO TABS: 500.0000 mg | ORAL_TABLET | Freq: Four times a day (QID) | ORAL | 5 refills | Status: DC

## 2021-03-06 MED ORDER — TROKENDI XR 50 MG PO CP24: ORAL_CAPSULE | ORAL | 5 refills | Status: DC

## 2021-03-06 NOTE — Progress Notes (Signed)
Patient: [name] MRN: [id number] Sex: male DOB: 05-03-1986  Provider: Wyline Copas, MD Location of Care: [facility]  Note type: Routine return visit  History of Present Illness: Referral Source: Anastasia Pall, MD History from: both parents, patient, and [facility] chart Chief Complaint: VNS/Epilepsy/Migraines  [name] is a 35 y.o. male who was evaluated March 06, 2021 for the first time since November 01, 2020.  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  A minority of his seizures can be stopped by swiping the vagal nerve stimulator magnet.  He was seen by Dr. Mariane Baumgarten at [facility] on March 21.  Vimpat was increased.  The patient had an episode of migraine with vomiting.  I am not certain why the family decided that the increase in Vimpat had caused that but they backed off.  He is due to see Dr. Marcia Brash again on July 18.  In March he had a number of seizures on the first he had a generalized seizure with apnea.  His father is figured out that there is the apnea may be related to obstruction of the airway.  If he sits, is upright and carefully positions his head, [name] does not experience apnea.  He had seizures which were able to be controlled by swiping the vagal nerve stimulator on the 9th, 11th, and 20th March.  He had generalized tonic-clonic seizures on March 23rd, 30th, and 31st.  These lasted in the range of 3 to 4 minutes.  After all the seizures in March his parents decided that [name] is spending too much time looking at at his iPad.  They took it away, and seizures seem to get better.  I did not certain why there is a believe that the iPad causes seizures but I have no problem with limiting his time on it.  It appears that there was only one seizure in April, on the 8th, a generalized seizure lasting 4  minutes.  In May he had 3 generalized seizures on the 4th, 20th, and 28 the first lasting 5 to 6 minutes the other 2 to 3 minutes in duration.  He then had a 1-1/2-minute complex partial seizure on May 5.  I  In June he had complex partial seizures lasting a minute and a half on the fourth and 13th and a formative generalized tonic-clonic seizure on the 11th.  He had migraines on March 13 April 2 which lasted 2 days in March 22.  His general health has been good.  I do not really know why he tends to cluster his seizures.  He is not experiencing migraines following his seizures.  These occur separately.  He has not contracted COVID and has not been vaccinated.  He had implantation of his current stimulator model 104, serial #[id number] September 27, 2020 I interrogated his vagal nerve stimulator.  Output status 2.25 mA, lead impedance 1613 ohms, generator battery was 18 to 25% which is stunning given that he was implanted on January 6.  The battery in February was 75-100% full.  I believe the interrogation.  I wonder if there is something wrong with the battery.  He has had a total of 7023 magnet activations, 5569 since last interrogation.  We duty cycle is 35% which is high number stimuli is high but it concerning that the battery we have changed so much in 4 months.  I interrogated  his vagal nerve stimulator and the output current is 2.25 mA frequency 20 Hz, pulse width 250 s, signal time on 30 seconds signal time off 1.1-minute magnet current 2.5 mA pulse width 250 s, signal time on 60 seconds I did not change either normal mode or magnet mode.  Review of Systems: A complete review of systems was remarkable for patient is here to be seen for a follow up. Mom reports that the patient experienced four petit mal seizures, three VNS seizures, and eight gran mal seizures. She reports three migraines as well. She has no other concerns at this time. , all other systems reviewed and negative.  Past Medical  History Diagnosis Date   ALLERGIC RHINITIS 12/26/2009   Blood clot in vein 2015   Brown's syndrome    Complication of anesthesia    seizure after pac surgery at [place] 2013 had to be intubated, no other anes issurs   Growth disorder    Headache    migraines   Heart murmur    moderate MR ses dr Darcus Pester   Hyperlipidemia    No therapy   Hypokalemia    Mental retardation, mild (I.Q. 50-70)    MITRAL VALVE PROLAPSE 06/22/2007   SEIZURE DISORDER 06/22/2007   last seizure 08-02-2019   Sleep apnea    cpap , last sleep study 10/01/11   Unspecified hypothyroidism 06/22/2007   Hospitalizations: No., Head Injury: No., Nervous System Infections: No., Immunizations up to date: Yes.    Copied from prior chart notes Diagnosis of developmental delay as a toddler.   EEG 11/07/86 was normal for gestational age.   Genetics evaluation short stature, prenatal onset, chromosomal study: 53 XY, fragile X syndrome negative, evaluation for MELAS and MERRF were negative.    Initial seizure 08/28/94 left locomotor with secondary generalization.   MRI of the brain 09/13/93: Scattered subcortical white matter lesions at the parietotemporal parieto-occipital junction is ischemic versus hamartomas.   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   Diagnosis attention deficit disorder inattentive type treated without success with Cylert April 1995   MRI brain 04/04/94 stable differential diagnosis hamartoma, vasculitis, ischemic, or embolic phenomenon.   Status epilepticus 01/31/95, and 04/15/95 Neurontin added to Tegretol   MRI brain 06/14/96 unchanged   Admission to [facility]. Tegretol 30 mcg for militate. 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   Topiramate started in May 1999 unable to be tolerated with Tegretol and was tapered and discontinued.   January 2000 EEG showed right greater than left mid temporal sharp waves was otherwise well-organized EEG. He hospitalizations in March, July, October, in November for recurrent seizures.   Patient placed on Depakote in April 2000 and developed gagging abdominal pain headache and had significant change in transaminases and liver functions. Topamax was restarted and Keppra was added.    Vimpat was started July 16, 2012 and adjusted upwards.   [name] was added on November 18, 2012 and has been adjusted upwards. Topamax was changed to Trokendi XR in attempt to deal with sleepiness, unsteadiness caused by polypharmacy. We to these medications have been introduced, the patient  experiences somewhat improved seizure control however Is quite likely that despite better seizure control, the patient is experiencing impairment in cognition and gait as a result of polypharmacy .   MRI of the brain on March 21, 2013. The patient has extensive calcification to the basal ganglia bilaterally, normal ventricle size, and no acute findings. EKG was unchanged.  Birth History He was a breech presentation, cesarean section delivery. He had intrauterine growth retardation, and failure to thrive.  Behavior History none  Surgical History Procedure Laterality Date   ANKLE SURGERY Left    fused does not bend, wears right leg brace   BIOPSY  08/22/2019   Procedure: BIOPSY;  Surgeon: Mauri Pole, MD;  Location: [facility];  Service: Endoscopy;;   COLONOSCOPY WITH PROPOFOL N/A 08/22/2019   Procedure: COLONOSCOPY WITH PROPOFOL;  Surgeon: Mauri Pole, MD;  Location: [facility];  Service: Endoscopy;  Laterality: N/A;   EYE SURGERY      X2 for Brown Syndrome   HEMOSTASIS CLIP PLACEMENT  08/22/2019   Procedure: HEMOSTASIS CLIP PLACEMENT;  Surgeon: Mauri Pole, MD;  Location: [facility];  Service: Endoscopy;;   IMPLANTATION VAGAL NERVE STIMULATOR     Left leaves on for surgeries   POLYPECTOMY  08/22/2019   Procedure: POLYPECTOMY;  Surgeon: Mauri Pole, MD;  Location: [facility];  Service: Endoscopy;;   PORTACATH PLACEMENT  2013 and removed and new placed   right chest    Family History family history includes Breast cancer in his mother; Clotting disorder in his mother; Colon polyps in his father and mother; Congestive Heart Failure in his maternal grandfather; Coronary artery disease in his maternal grandfather and paternal grandfather; Diabetes in his father; Hyperlipidemia in his father; Hypertension in his father; Lung cancer in his paternal grandfather; Pneumonia in his maternal grandfather and paternal grandmother; Stroke in his paternal grandmother. Family history is negative for migraines, seizures, intellectual disabilities, blindness, deafness, birth defects, chromosomal disorder, or autism.  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    He lives with his parents.    He enjoys helping around the house, Nascar, and football.   Allergies Allergen Reactions   Antihistamines, Chlorpheniramine-Type Other (See Comments)    Other Reaction: Dimetapp causes seizures    Divalproex Sodium Other (See Comments)    Nausea, vomiting, liver and kidney dysfunction   Phenytoin Rash and Other (See Comments)    Dilantin causes more seizures.    Valproic Acid And Related Other (See Comments)    Shuts down systems   Vancomycin Rash, Other (See Comments) and Swelling    If given too fast, causes red man reaction Other Reaction: Redman's Syndrome   Physical Exam BP 110/70   Pulse 76   Ht 5\' 2"  (1.575 m)   Wt 125 lb 9.6 oz (57 kg)   BMI 22.97 kg/m   General: alert, well developed, well nourished,  in no acute distress, brown hair, hazel eyes, right handed Head: normocephalic, no dysmorphic features Ears, Nose and Throat: Otoscopic: tympanic membranes normal; pharynx: oropharynx is pink without exudates or tonsillar hypertrophy Neck: diminished range of motion, head is tilted with the right ear toward right shoulder chin pointed to the left and is swollen bilaterally; no cranial or cervical bruits Respiratory: auscultation clear Cardiovascular: no murmurs, pulses are normal Musculoskeletal: clawhand deformities bilaterally, unable to fully straighten his fingers or apparent scoliosis; AFO right leg Skin: no rashes or neurocutaneous lesions  Neurologic Exam  Mental Status: alert; oriented to person, place; knowledge is below normal for age; language is below normal, but he is able to convey thoughts and feelings and follow commands, his speech is dysarthric but intelligible Cranial Nerves: visual fields are full to double simultaneous stimuli; extraocular movements are full and at times dysconjugate; pupils are round, reactive to light; funduscopic examination shows sharp disc margins with normal vessels; he has significant photophobia symmetric facial strength; midline tongue and uvula; air conduction is greater than bone conduction bilaterally Motor: Mild diffuse weakness, normal tone and mass; clumsy fine motor movements;, but he is able to oppose his thumbs to all  fingers; no pronator drift Sensory: intact responses to cold, vibration, proprioception and stereognosis Coordination: good finger-to-nose, clumsy rapid repetitive alternating movements Gait and Station: broad-based, antalgic, diplegic gait and station; today it seemed that he was not moving his right arm as much as the left it was held extended at his side;: patient is able to tandem with slight difficulty; balance is fair; Romberg exam is negative Reflexes: symmetric and diminished bilaterally; no clonus; bilateral flexor plantar responses   Assessment 1.  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 2.  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 3.  Obstructive sleep apnea, G47.33. 4.  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 5.  Mild intellectual disability, F70. 6.  Hypokalemia, E87.6.   7.  Migraine without aura, without status migrainosus, not intractable, G43.009. 8.  Hypothyroidism, E03.9.  Discussion [name] is medically and neurologically stable.  His seizures are variable as are his migraines.  I am very concerned about the change in generator battery dropping from 75 to 100% in February to 18 to 25%.  His last battery at the same settings was implanted April 19, 2014 and removed September 27, 2020, about 6-1/2 years later.  Plan I am going to send a copy of this note to Dr. Marcia Brash so that he is aware of this issue and can interrogate the stimulator with his device.  If the battery has discharged this much, there is something wrong with it.  I do not understand how I could get a false reading.  I have never seen this happen in all the years that I have worked with patients with implanted vagal nerve stimulators.  Cannot we make any changes in his medications.  The family has expressed the wish that he be followed both by Dr. Franco Nones, my partner and also by Dr. Mariane Baumgarten.  I think that we can only have 1 person adjusting his medication, but it is  good to have a neurologist in town in the event of an emergency.  Greater than 50% of a 40-minute visit was spent in counseling and coordination of care concerning his seizures and migraines.  I also interrogated the vagal nerve stimulator as described above.  He will return in 3 months for routine visit.  I hope to be able to introduce him to Dr.  Abdelmoumen who is out of the office today.   Medication List    Accurate as of March 06, 2021 11:42 AM. If you have any questions, ask your nurse or doctor.     acetaminophen 500 MG tablet Commonly known as: TYLENOL Take 1,000 mg by mouth every 6 (six) hours as needed for headache.   albuterol 1.25 MG/3ML nebulizer solution Commonly known as: ACCUNEB Take 1 ampule by nebulization every 4 (four) hours as needed for wheezing.   carbamazepine 100 MG chewable tablet Commonly known as: TEGRETOL CHEW 2 TABLETS IN THE MORNING, 2 TABLETS AT MIDDAY AND 1 & 1/2 TABLETS IN THE EVENING   cetirizine 10 MG tablet Commonly known as: ZYRTEC Take 10 mg by mouth daily.   Dextromethorphan-guaiFENesin 5-100 MG/5ML Liqd Take 10 mLs by mouth 2 (two) times daily as needed (cough).   diazepam 2 MG tablet Commonly known as: Valium Take 1 tablet every 6 - 8 hours as needed for anxiety or seizures   Flaxseed Oil 1000 MG Caps Take 1,000 mg by mouth daily.   guaiFENesin 600 MG 12 hr tablet Commonly known as: MUCINEX Take 600 mg by mouth 2 (two) times daily.   K-Phos 500 MG tablet Generic drug: potassium phosphate (monobasic) TAKE 1 TABLET BY MOUTH FOUR TIMES A DAY   Klor-Con M20 20 MEQ tablet Generic drug: potassium chloride SA TAKE 1 TABLET BY MOUTH EVERY DAY   levETIRAcetam 500 MG tablet Commonly known as: KEPPRA Take 500 mg by mouth as directed. Takes 2 tablets in the morning and 2.5 tablets in the evening   levothyroxine 25 MCG tablet Commonly known as: SYNTHROID Take 1 tablet (25 mcg total) by mouth daily before breakfast.   LORazepam 2 MG/ML  injection Commonly known as: ATIVAN Inject 1 mL (2 mg total) into the vein See admin instructions. Dilute 2mg  of lorazepam with 47mL of saline.  Administer IV push over 3-5 minutes as needed for seizures.   montelukast 10 MG tablet Commonly known as: SINGULAIR Take 10 mg by mouth at bedtime.   multivitamin with minerals Tabs tablet Take 1 tablet by mouth daily.   Nayzilam 5 MG/0.1ML Soln Generic drug: Midazolam Take 5 mg after 2 minutes of seizures, may repeat in 10 minutes if seizures persist. What changed:  how much to take how to take this when to take this additional instructions   Nurtec 75 MG Tbdp Generic drug: Rimegepant Sulfate Take 1 tablet at onset of migraine   Omega-3 500 MG Caps Take 500 mg by mouth daily.   ondansetron 4 MG disintegrating tablet Commonly known as: ZOFRAN-ODT Take 1 tablet as needed for nausea and vomiting What changed:  how much to take how to take this when to take this reasons to take this additional instructions   [name] 10 MG tablet Generic drug: cloBAZam TAKE 1 TABLET BY MOUTH IN THE MORNING, 1 TAB IN THE AFTERNOON, AND 2 TABS AT BEDTIME   PHENObarbital 65 MG/ML injection Commonly known as: LUMINAL Withdraw 25ml (65mg ) Phenobarbital and 64ml Normal Saline into 5ml syringe. Adminster IV push for 3-5 minutes as needed for recurrent seizures   promethazine 25 MG suppository Commonly known as: PHENERGAN Place 25 mg rectally every 6 (six) hours as needed for nausea or vomiting.   rosuvastatin 10 MG tablet Commonly known as: CRESTOR Take 10 mg by mouth at bedtime.   Trokendi XR 50 MG Cp24 Generic drug: Topiramate ER Take 1 capsule at nighttime   Trokendi XR 200 MG Cp24 Generic  drug: Topiramate ER TAKE 1 CAPSULE BY MOUTH AT BEDTIME   Vimpat 100 MG Tabs Generic drug: Lacosamide TAKE 1 TABLET (100 MG) BY MOUTH 2 (TWO) TIMES DAILY     The medication list was reviewed and reconciled. All changes or newly prescribed medications were  explained.  A complete medication list was provided to the patient/caregiver.  Jodi Geralds MD

## 2021-03-06 NOTE — Patient Instructions (Signed)
It was a pleasure to see you today.  I would not make any changes in current treatment.  I am quite surprised that the battery has drawn down so quickly in 6 months.  I think that there is either got to be something wrong with the battery or with our reporting system.  Please discuss this with [name] when you see him.  Have him send a copy of his note to me.  I will send a copy of my note to him.  We will plan to see [name] again in mid-September before I retire.  After that time we will transfer care jointly to [name] and also [name].  Check with [name] and also with [name] about long-term endocrine care for [facility].  [place] today to contact me if you need my help between now and when I see you again in September.

## 2021-03-08 ENCOUNTER — Other Ambulatory Visit (INDEPENDENT_AMBULATORY_CARE_PROVIDER_SITE_OTHER): Payer: Self-pay | Admitting: Family

## 2021-03-08 DIAGNOSIS — G40319 Generalized idiopathic epilepsy and epileptic syndromes, intractable, without status epilepticus: Secondary | ICD-10-CM

## 2021-03-08 DIAGNOSIS — G40119 Localization-related (focal) (partial) symptomatic epilepsy and epileptic syndromes with simple partial seizures, intractable, without status epilepticus: Secondary | ICD-10-CM

## 2021-03-11 NOTE — Telephone Encounter (Signed)
Called pharmacy as our records show this was request was sent out 01/2021 with 5 refills.   The request was sent to our office in error. They have it deleted on their end. Will refuse on our end to clear from queue.

## 2021-05-16 ENCOUNTER — Ambulatory Visit: Payer: Medicaid Other | Attending: Family Medicine | Admitting: Physical Therapy

## 2021-05-16 ENCOUNTER — Other Ambulatory Visit: Payer: Self-pay

## 2021-05-16 DIAGNOSIS — G800 Spastic quadriplegic cerebral palsy: Secondary | ICD-10-CM | POA: Insufficient documentation

## 2021-05-16 DIAGNOSIS — R2689 Other abnormalities of gait and mobility: Secondary | ICD-10-CM | POA: Diagnosis present

## 2021-05-16 DIAGNOSIS — R2681 Unsteadiness on feet: Secondary | ICD-10-CM | POA: Insufficient documentation

## 2021-05-16 DIAGNOSIS — M6281 Muscle weakness (generalized): Secondary | ICD-10-CM | POA: Insufficient documentation

## 2021-05-17 NOTE — Therapy (Signed)
[facility] [address] [place], [place], [address] Phone: [phone]   Fax:  [phone]  Physical Therapy Evaluation  Patient Details  Name: [name] MRN: [id number] Date of Birth: Oct 07, 1985 Referring Provider (PT): Dr. Anastasia Pall   Encounter Date: 05/16/2021   PT End of Session - 05/17/21 1437     Visit Number 1    Number of Visits 1    Authorization Type Medicaid    PT Start Time 1100    PT Stop Time [id number]    PT Time Calculation (min) 80 min    Activity Tolerance Patient tolerated treatment well    Behavior During Therapy [facility] for tasks assessed/performed             Past Medical History:  Diagnosis Date   ALLERGIC RHINITIS 12/26/2009   Blood clot in vein 2015   Brown's syndrome    Complication of anesthesia    seizure after pac surgery at [place] 2013 had to be intubated, no other anes issurs   Growth disorder    Headache    migraines   Heart murmur    moderate MR ses dr Darcus Pester   Hyperlipidemia    No therapy   Hypokalemia    Mental retardation, mild (I.Q. 50-70)    MITRAL VALVE PROLAPSE 06/22/2007   SEIZURE DISORDER 06/22/2007   last seizure 08-02-2019   Sleep apnea    cpap , last sleep study 10/01/11   Unspecified hypothyroidism 06/22/2007    Past Surgical History:  Procedure Laterality Date   ANKLE SURGERY Left    fused does not bend, wears right leg brace   BIOPSY  08/22/2019   Procedure: BIOPSY;  Surgeon: Mauri Pole, MD;  Location: [facility];  Service: Endoscopy;;   COLONOSCOPY WITH PROPOFOL N/A 08/22/2019   Procedure: COLONOSCOPY WITH PROPOFOL;  Surgeon: Mauri Pole, MD;  Location: [facility];  Service: Endoscopy;  Laterality: N/A;   EYE SURGERY     X2 for Brown Syndrome   HEMOSTASIS CLIP PLACEMENT  08/22/2019   Procedure: HEMOSTASIS CLIP PLACEMENT;  Surgeon: Mauri Pole, MD;  [facility];  Service: Endoscopy;;    IMPLANTATION VAGAL NERVE STIMULATOR     Left leaves on for surgeries   POLYPECTOMY  08/22/2019   Procedure: POLYPECTOMY;  Surgeon: Mauri Pole, MD;  [facility];  Service: Endoscopy;;   PORTACATH PLACEMENT  2013 and removed and new placed   right chest    There were no vitals filed for this visit.    Subjective Assessment - 05/17/21 1434     Subjective Pt presents for power w/c eval - accompanied by his parents; Deberah Pelton, ATP with [employer] present for eval    Patient is accompained by: Family member    Pertinent History spastic CP    Patient Stated Goals obtain power w/c    Currently in Pain? No/denies                [facility] PT Assessment - 05/17/21 0001       Assessment   Medical Diagnosis Spastic CP    Referring Provider (PT) Dr. Anastasia Pall    Onset Date/Surgical Date --   congenital     Precautions   Precautions Fall      Balance Screen   Has the patient fallen in the past 6 months Yes    How many times? approx. 30 per mother's report    Has the patient had  a decrease in activity level because of a fear of falling?  No    Is the patient reluctant to leave their home because of a fear of falling?  No      Prior Function   Level of Independence Needs assistance with ADLs;Needs assistance with gait                        Objective measurements completed on examination: See above findings.            LMN for power w/c to be completed - Deberah Pelton, ATP with [employer] present for eval; recommend Permobil F3 for independence & safety With mobility                Plan - 05/17/21 1438     Clinical Impression Statement Pt evaluated for power w/c - Permobil F3 with power recline recommended due to pt's hx of multiple seizures, some of which are [facility] and have required caregiver to perform CPR.  See LMN for power w/c for details.    Personal Factors and Comorbidities Comorbidity 2    Comorbidities  seizure disorder, mitral valve prolapse, spastic CP    Examination-Activity Limitations Bathing;Locomotion Level;Transfers;Hygiene/Grooming;Toileting;Stand;Stairs;Squat    Examination-Participation Restrictions Cleaning;Interpersonal Relationship;Laundry;Shop;Meal Prep;Community Activity    Stability/Clinical Decision Making Evolving/Moderate complexity    Clinical Decision Making Moderate    PT Frequency One time visit    Consulted and Agree with Plan of Care Patient;Family member/caregiver             Patient will benefit from skilled therapeutic intervention in order to improve the following deficits and impairments:  Abnormal gait, Decreased cognition, Decreased strength, Decreased balance, Decreased range of motion, Impaired tone  Visit Diagnosis: Muscle weakness (generalized) - Plan: PT plan of care cert/re-cert  Other abnormalities of gait and mobility - Plan: PT plan of care cert/re-cert  Unsteadiness on feet - Plan: PT plan of care cert/re-cert  Spastic quadriplegic cerebral palsy ([place]) - Plan: PT plan of care cert/re-cert     Problem List Patient Active Problem List   Diagnosis Date Noted   Chronic migraine without aura with status migrainosus, not intractable 11/01/2020   Adenomatous polyp of transverse colon 09/17/2019   BRBPR (bright red blood per rectum) 09/17/2019   Polyp of sigmoid colon    Migraine without aura and without status migrainosus, not intractable 02/22/2018   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   Hypokalemia 08/03/2013   Hypothyroidism 08/02/2013   Subtherapeutic international normalized ratio (INR) 08/02/2013   Contracture of ankle and foot joint 03/29/2013   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[place]) 12/21/2012   Partial epilepsy with intractable epilepsy ([place]) 12/21/2012   Obstructive sleep apnea (adult) (pediatric) 12/21/2012   Acute venous embolism and thrombosis of subclavian veins ([place]) 12/21/2012   Acute venous  embolism and thrombosis of internal jugular veins ([facility]) 12/21/2012   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   Mechanical complication of other vascular device, implant, and graft 12/21/2012   Acute embolism and thrombosis of unspecified vein 12/21/2012   Cavus deformity of foot, acquired 11/23/2012   Long term (current) use of anticoagulants 05/21/2012   Mitral regurgitation 04/29/2012   Atrial fibrillation ([place]) 04/08/2012   Status epilepticus ([place]) 04/08/2012   Irregular heart beat    DVT of upper extremity (deep vein thrombosis) ([place]) 09/04/2011   Brown's syndrome 11/13/2010   Allergic rhinitis 12/26/2009   Mitral valve disease 06/22/2007    Alda Lea, PT 05/17/2021,  2:46 PM  [facility] [address] [place], [place], [address] Phone: [phone]   Fax:  [phone]  Name: Courtland Lotz [facility] Perez MRN: [id number] Date of Birth: Nov 08, 1985

## 2021-05-29 ENCOUNTER — Other Ambulatory Visit: Payer: Self-pay

## 2021-05-29 ENCOUNTER — Encounter (INDEPENDENT_AMBULATORY_CARE_PROVIDER_SITE_OTHER): Payer: Self-pay | Admitting: Pediatrics

## 2021-05-29 ENCOUNTER — Ambulatory Visit (INDEPENDENT_AMBULATORY_CARE_PROVIDER_SITE_OTHER): Payer: Medicaid Other | Admitting: Pediatrics

## 2021-05-29 VITALS — BP 100/90 | HR 80 | Ht 62.01 in | Wt 128.4 lb

## 2021-05-29 DIAGNOSIS — G43009 Migraine without aura, not intractable, without status migrainosus: Secondary | ICD-10-CM

## 2021-05-29 DIAGNOSIS — F70 Mild intellectual disabilities: Secondary | ICD-10-CM

## 2021-05-29 DIAGNOSIS — G40119 Localization-related (focal) (partial) symptomatic epilepsy and epileptic syndromes with simple partial seizures, intractable, without status epilepticus: Secondary | ICD-10-CM

## 2021-05-29 DIAGNOSIS — E876 Hypokalemia: Secondary | ICD-10-CM

## 2021-05-29 DIAGNOSIS — R2689 Other abnormalities of gait and mobility: Secondary | ICD-10-CM

## 2021-05-29 DIAGNOSIS — G40319 Generalized idiopathic epilepsy and epileptic syndromes, intractable, without status epilepticus: Secondary | ICD-10-CM

## 2021-05-29 DIAGNOSIS — G4733 Obstructive sleep apnea (adult) (pediatric): Secondary | ICD-10-CM

## 2021-05-29 MED ORDER — TROKENDI XR 200 MG PO CP24: 1.0000 | ORAL_CAPSULE | Freq: Every day | ORAL | 3 refills | Status: DC

## 2021-05-29 NOTE — Progress Notes (Signed)
Patient: [name] MRN: [id number] Sex: male DOB: June 07, 1986  Provider: Wyline Copas, MD Location of Care: [facility]  Note type: Routine return visit  History of Present Illness: Referral Source: Anastasia Pall, MD History from: both parents and sibling, patient, and [facility] chart Chief Complaint: Migraine without aura and without status migrainosus, not intractable  [name] is a 35 y.o. male who was evaluated May 29, 2021 for the first time since March 06, 2021.  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  Majority of his seizures can be stopped by swiping his vagal nerve stimulator with a magnet.  I have followed him jointly in the recent past with Dr. Jeoffrey Massed at [facility].  Dr. Marcia Brash has tried to increase Vimpat and decrease Tegretol simultaneously.  NP and everything else, this is proven to be quite difficult.  Since his last visit:  June he had 2 days of continuous migraine on the 15th and 16th into minute and a half seizures July he had 4 seizures, at least 1 generalized tonic-clonic and 5 days of migraine that began after Vimpat was increased at bedtime.  Like Dr. Lisabeth Devoid I do not believe that Vimpat was responsible for his migraines however I told his parents that they needed to think about bringing him to an urgent care [facility] for a migraine cocktail if he had more than 2 days of migraine. In August he had a total of 6 seizures the day after missing his medication at nighttime his mother accessed his port and medicated him with phenobarbital and Ativan for treatment that has worked for the last 20 years.  He had 2 more seizures in August On September 5 for the first time in 10 days he had a seizure where he fell straight backwards while standing and struck his head.  His father reached  him and noted that he was apneic he did 3-4 rescue breaths and [name] came around.  EMS evaluated him and felt that he was at baseline and probably did not need to be transported to the hospital.  Another attempt was made to increase Vimpat and [name] began to stagger.  The morning dose of Tegretol was dropped to 1-1/2 tablets and his gait improved.  Another attempt to increase Vimpat but just aggregate and again dropping Tegretol to 1-1/2 tablets in the afternoon improved his gait stability.  [name] has obstructive apnea and wears a CPAP machine.  When he has seizures associated with apnea it usually is an issue of occluding his airway because of his head position.  Getting behind him and straightening his head is the most effective way of restoring his breathing.  Interrogation of the vagal nerve stimulator  The VNS was implanted September 27, 2020 and is a Demi pulse duo and 104 serial [id number] Output status 2.25 mA, lead impedance 1626 ohms generator battery 75 to 100% total magnet activations 11,116.  Normal mode output current 2.25 mA, frequency 20 Hz, pulse width 250 s, on time stimulation 30 seconds, off time 1.1 minutes duty cycle 35%.  Magnet mode output current 2.5 mA, pulse width 250 s, on time stimulation 60 seconds.  No changes were made.  Diagnostic showed that the stimulator is working well.  It is not clear to me why he got aberrant data in June suggesting that the battery only had 18 to 25% Dr. Lisabeth Devoid saw the  patient about 3 weeks after I did and his interrogation was similar to the one described above.  Review of Systems: A complete review of systems was assessed and was negative except as noted above.  Past Medical History Diagnosis Date   ALLERGIC RHINITIS 12/26/2009   Blood clot in vein 2015   Brown's syndrome    Complication of anesthesia    seizure after pac surgery at [place] 2013 had to be intubated, no other anes issurs   Growth disorder    Headache    migraines    Heart murmur    moderate MR ses dr Darcus Pester   Hyperlipidemia    No therapy   Hypokalemia    Mental retardation, mild (I.Q. 50-70)    MITRAL VALVE PROLAPSE 06/22/2007   SEIZURE DISORDER 06/22/2007   last seizure 08-02-2019   Sleep apnea    cpap , last sleep study 10/01/11   Unspecified hypothyroidism 06/22/2007   Hospitalizations: No., Head Injury: No., Nervous System Infections: No., Immunizations up to date: Yes.    Copied from prior chart notes Diagnosis of developmental delay as a toddler.   EEG 11[birth year]07/86 was normal for gestational age.   Genetics evaluation short stature, prenatal onset, chromosomal study: 15 XY, fragile X syndrome negative, evaluation for MELAS and MERRF were negative.    Initial seizure 08/28/94 left locomotor with secondary generalization.   MRI of the brain 09/13/93: Scattered subcortical white matter lesions at the parietotemporal parieto-occipital junction is ischemic versus hamartomas.   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   Diagnosis attention deficit disorder inattentive type treated without success with Cylert April 1995   MRI brain 04/04/94 stable differential diagnosis hamartoma, vasculitis, ischemic, or embolic phenomenon.   Status epilepticus 01/31/95, and 04/15/95 Neurontin added to Tegretol   MRI brain 06/14/96 unchanged   Admission to [facility]. Tegretol 30 mcg for militate. 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   Topiramate started in May 1999 unable to be tolerated with Tegretol and was tapered and  discontinued.   January 2000 EEG showed right greater than left mid temporal sharp waves was otherwise well-organized EEG. He hospitalizations in March, July, October, in November for recurrent seizures.   Patient placed on Depakote in April 2000 and developed gagging abdominal pain headache and had significant change in transaminases and liver functions. Topamax was restarted and Keppra was added.    Vimpat was started July 16, 2012 and adjusted upwards.   [name] was added on November 18, 2012 and has been adjusted upwards. Topamax was changed to Trokendi XR in attempt to deal with sleepiness, unsteadiness caused by polypharmacy. We to these medications have been introduced, the patient experiences somewhat improved seizure control however Is quite likely that despite better seizure control, the patient is experiencing impairment in cognition and gait as a result of polypharmacy .   MRI of the brain on March 21, 2013. The patient has extensive calcification to the basal ganglia bilaterally, normal ventricle size, and no acute findings. EKG was unchanged.   Birth History He was a breech presentation, cesarean section delivery. He had intrauterine growth retardation,  and failure to thrive.  Behavior History none  Surgical History Past Surgical History:  Procedure Laterality Date   ANKLE SURGERY Left    fused does not bend, wears right leg brace   BIOPSY  08/22/2019   Procedure: BIOPSY;  Surgeon: Mauri Pole, MD;  [facility];  Service: Endoscopy;;   COLONOSCOPY WITH PROPOFOL N/A 08/22/2019   Procedure: COLONOSCOPY WITH PROPOFOL;  Surgeon: Mauri Pole, MD;  [facility];  Service: Endoscopy;  Laterality: N/A;   EYE SURGERY     X2 for Brown Syndrome   HEMOSTASIS CLIP PLACEMENT  08/22/2019   Procedure: HEMOSTASIS CLIP PLACEMENT;  Surgeon: Mauri Pole, MD;  [facility];  Service: Endoscopy;;   IMPLANTATION VAGAL NERVE STIMULATOR      Left leaves on for surgeries   POLYPECTOMY  08/22/2019   Procedure: POLYPECTOMY;  Surgeon: Mauri Pole, MD;  [facility];  Service: Endoscopy;;   PORTACATH PLACEMENT  2013 and removed and new placed   right chest    Family History family history includes Breast cancer in his mother; Clotting disorder in his mother; Colon polyps in his father and mother; Congestive Heart Failure in his maternal grandfather; Coronary artery disease in his maternal grandfather and paternal grandfather; Diabetes in his father; Hyperlipidemia in his father; Hypertension in his father; Lung cancer in his paternal grandfather; Pneumonia in his maternal grandfather and paternal grandmother; Stroke in his paternal grandmother. Family history is negative for migraines, seizures, intellectual disabilities, blindness, deafness, birth defects, chromosomal disorder, or autism.  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    He lives with his parents.    He enjoys helping around the house, Nascar, and football.   Allergies Allergen Reactions   Antihistamines, Chlorpheniramine-Type Other (See Comments)    Other Reaction: Dimetapp causes seizures   Divalproex Sodium Other (See Comments)    Nausea, vomiting, liver and kidney dysfunction   Phenytoin Rash and Other (See Comments)    Dilantin causes more seizures.    Valproic Acid And Related Other (See Comments)    Shuts down  systems   Vancomycin Rash, Other (See Comments) and Swelling    If given too fast, causes red man reaction Other Reaction: Redman's Syndrome   Physical Exam BP 100/90   Pulse 80   Ht 5' 2.01" (1.575 m)   Wt 128 lb 6.4 oz (58.2 kg)   BMI 23.48 kg/m   General: alert, well developed, well nourished, in no acute distress, brown hair, hazel eyes, right handed Head: normocephalic, no dysmorphic features Ears, Nose and Throat: Otoscopic: tympanic membranes occluded with wax Neck: Diminished range of motion, head tilted with the right ear toward the right shoulder chin pointed to the left swollen bilaterally, no cranial or cervical bruits Respiratory: auscultation clear Cardiovascular: no murmurs, pulses are normal Musculoskeletal: clawhand deformities bilaterally, unable to fully straighten his fingers  or apparent scoliosis; AFO right leg Skin: no rashes or neurocutaneous lesions  Neurologic Exam  Mental Status: alert; oriented to person, place; knowledge is below normal for age; language is below normal, but he is able to indicate his wants and needs, follow commands; he has dysarthria but is intelligible, speech is guttural Cranial Nerves: visual fields are full to double simultaneous stimuli; extraocular movements are full and at times disconjugate; pupils are round, reactive to light; funduscopic examination shows sharp disc margins with normal vessels; symmetric facial strength; midline tongue; air conduction is greater than bone conduction bilaterally Motor: mild diffuse weakness particularly distally, normal tone and mass; clumsy fine motor movements; no pronator drift Sensory: intact responses to cold, vibration, proprioception and stereognosis Coordination: good finger-to-nose, clumsy rapid repetitive alternating movements and finger apposition Gait and Station: broad-based, antalgic, diplegic gait and station; I did not see difference in arm swing today; balance is fair Reflexes:  symmetric and absent bilaterally; no clonus; bilateral flexor plantar responses   Assessment 1.  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 2.  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 3.  Obstructive sleep apnea, G47.33. 4.  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 5.  Mild intellectual disability, F70. 6.  Hypokalemia, E87.6.   7.  Migraine without aura, without status migrainosus, not intractable, G43.009.  Discussion [name] is stable medically and neurologically.  He has flurries of seizures.  There is literally nothing that can be done other than to stop them using IV phenobarbital and Ativan.  It is hard to believe that it works, but it has for the past 20 years.  There are some time at which are not afraid were going to lose the central line and it will clot as the other one did.  This has kept [name] out of the hospital.  Plan There is no reason to change his current treatment.  He is on polypharmacy.  I think that is reasonable to try to simplify his regimen by increasing Vimpat and decreasing Tegretol.  He remains to be seen if that is going to be possible.  Greater than 50% of the 30-minute visit was spent in counseling and coordination of care concerning his multiple neurologic issues, reviewing his medicines to determine which ones needed to be refilled, discussing the concept of a migraine cocktail for prolonged migraine, and also discussing transition of care issues.  In addition, I interrogated his vagal nerve stimulator and found it to be functioning well.   Medication List    Accurate as of May 29, 2021  7:40 PM. If you have any questions, ask your nurse or doctor.     acetaminophen 500 MG tablet Commonly known as: TYLENOL Take 1,000 mg by mouth every 6 (six) hours as needed for headache.   albuterol 1.25 MG/3ML nebulizer solution Commonly known as: ACCUNEB Take 1 ampule by nebulization every 4 (four) hours as needed for wheezing.    carbamazepine 100 MG chewable tablet Commonly known as: TEGRETOL CHEW 2 TABLETS IN THE MORNING, 2 TABLETS AT MIDDAY AND 1 & 1/2 TABLETS IN THE EVENING   cetirizine 10 MG tablet Commonly known as: ZYRTEC Take 10 mg by mouth daily.   Dextromethorphan-guaiFENesin 5-100 MG/5ML Liqd Take 10 mLs by mouth 2 (two) times daily as needed (cough).   diazepam 2 MG tablet Commonly known as: Valium Take 1 tablet every 6 - 8 hours as needed for anxiety or seizures   Flaxseed Oil 1000 MG Caps Take 1,000 mg by mouth daily.  guaiFENesin 600 MG 12 hr tablet Commonly known as: MUCINEX Take 600 mg by mouth 2 (two) times daily.   K-Phos 500 MG tablet Generic drug: potassium phosphate (monobasic) Take 1 tablet (500 mg total) by mouth 4 (four) times daily.   levETIRAcetam 500 MG tablet Commonly known as: KEPPRA Take 500 mg by mouth as directed. Takes 2 tablets in the morning and 2.5 tablets in the evening   levothyroxine 25 MCG tablet Commonly known as: SYNTHROID Take 1 tablet (25 mcg total) by mouth daily before breakfast.   LORazepam 2 MG/ML injection Commonly known as: ATIVAN Inject 1 mL (2 mg total) into the vein See admin instructions. Dilute '2mg'$  of lorazepam with 60m of saline.  Administer IV push over 3-5 minutes as needed for seizures.   montelukast 10 MG tablet Commonly known as: SINGULAIR Take 10 mg by mouth at bedtime.   multivitamin with minerals Tabs tablet Take 1 tablet by mouth daily.   Nayzilam 5 MG/0.1ML Soln Generic drug: Midazolam Take 5 mg after 2 minutes of seizures, may repeat in 10 minutes if seizures persist. What changed:  how much to take how to take this when to take this additional instructions   Nurtec 75 MG Tbdp Generic drug: Rimegepant Sulfate Take 1 tablet at onset of migraine   Omega-3 500 MG Caps Take 500 mg by mouth daily.   ondansetron 4 MG disintegrating tablet Commonly known as: ZOFRAN-ODT Take 1 tablet as needed for nausea and  vomiting What changed:  how much to take how to take this when to take this reasons to take this additional instructions   [name] 10 MG tablet Generic drug: cloBAZam TAKE 1 TABLET BY MOUTH IN THE MORNING, 1 TAB IN THE AFTERNOON, AND 2 TABS AT BEDTIME   PHENObarbital 65 MG/ML injection Commonly known as: LUMINAL Withdraw 161m('65mg'$ ) Phenobarbital and 80m55mormal Saline into 5ml61mringe. [place] IV push for 3-5 minutes as needed for recurrent seizures   potassium chloride SA 20 MEQ tablet Commonly known as: Klor-Con M20 Take 1 tablet (20 mEq total) by mouth daily.   promethazine 25 MG suppository Commonly known as: PHENERGAN Place 25 mg rectally every 6 (six) hours as needed for nausea or vomiting.   rosuvastatin 10 MG tablet Commonly known as: CRESTOR Take 10 mg by mouth at bedtime.   Trokendi XR 50 MG Cp24 Generic drug: Topiramate ER Take 1 capsule at nighttime   Trokendi XR 200 MG Cp24 Generic drug: Topiramate ER Take 1 capsule by mouth at bedtime.   Vimpat 100 MG Tabs Generic drug: Lacosamide TAKE 1 TABLET (100 MG) BY MOUTH 2 (TWO) TIMES DAILY     The medication list was reviewed and reconciled. All changes or newly prescribed medications were explained.  A complete medication list was provided to the patient/caregiver.  WillJodi Geralds

## 2021-05-29 NOTE — Patient Instructions (Signed)
It was a pleasure to see you.  I am glad that [name] had such a long period between when he had seizures although I am sorry to hear that he had a rather severe one on September 5.  I think that trying to simplify his treatment regimen is a good idea is going to be a long process just as it was to get him to where he sat.  I am going to have Dr. Franco Nones in 3 months.  I will talk to her about the relationship that we have with Dr. Mariane Baumgarten at [facility].  I would call the Urgent Care at [place] and see if they would be able to accommodate a migraine cocktail.  That would be the best place for him to go if they provided that service.  I looked carefully and the only medication that is going to need refill within the next 2 months is Trokendi 200 mg so I printed that.  Is been a privilege to care for [name] over these years.  I will miss you all very much.  I wish you all well, always.

## 2021-06-01 ENCOUNTER — Other Ambulatory Visit (INDEPENDENT_AMBULATORY_CARE_PROVIDER_SITE_OTHER): Payer: Self-pay | Admitting: Family

## 2021-06-01 DIAGNOSIS — G40319 Generalized idiopathic epilepsy and epileptic syndromes, intractable, without status epilepticus: Secondary | ICD-10-CM

## 2021-06-04 ENCOUNTER — Other Ambulatory Visit (INDEPENDENT_AMBULATORY_CARE_PROVIDER_SITE_OTHER): Payer: Self-pay | Admitting: Family

## 2021-06-04 DIAGNOSIS — G40319 Generalized idiopathic epilepsy and epileptic syndromes, intractable, without status epilepticus: Secondary | ICD-10-CM

## 2021-06-04 MED ORDER — ONFI 10 MG PO TABS: ORAL_TABLET | ORAL | 5 refills | Status: DC

## 2021-08-28 ENCOUNTER — Encounter (INDEPENDENT_AMBULATORY_CARE_PROVIDER_SITE_OTHER): Payer: Self-pay | Admitting: Pediatrics

## 2021-08-28 ENCOUNTER — Other Ambulatory Visit: Payer: Self-pay

## 2021-08-28 ENCOUNTER — Ambulatory Visit (INDEPENDENT_AMBULATORY_CARE_PROVIDER_SITE_OTHER): Payer: Medicaid Other | Admitting: Pediatrics

## 2021-08-28 VITALS — BP 90/78 | HR 88 | Ht 62.48 in | Wt 134.0 lb

## 2021-08-28 DIAGNOSIS — F70 Mild intellectual disabilities: Secondary | ICD-10-CM

## 2021-08-28 DIAGNOSIS — G40119 Localization-related (focal) (partial) symptomatic epilepsy and epileptic syndromes with simple partial seizures, intractable, without status epilepticus: Secondary | ICD-10-CM

## 2021-08-28 DIAGNOSIS — G43009 Migraine without aura, not intractable, without status migrainosus: Secondary | ICD-10-CM | POA: Diagnosis not present

## 2021-08-28 DIAGNOSIS — G4733 Obstructive sleep apnea (adult) (pediatric): Secondary | ICD-10-CM

## 2021-08-28 DIAGNOSIS — G40319 Generalized idiopathic epilepsy and epileptic syndromes, intractable, without status epilepticus: Secondary | ICD-10-CM

## 2021-08-28 DIAGNOSIS — R2689 Other abnormalities of gait and mobility: Secondary | ICD-10-CM

## 2021-08-28 NOTE — Patient Instructions (Signed)
I had the pleasure of seeing [name] today for neurology for refractory epilepsy. [name] was accompanied by his parents who provided historical information.    Plan: Continue AEDs as prescribed Follow up in 6-7 months, in June 2023 Follow up with Dr Jeri Lager at [facility] as scheduled every 3 months.  Call neurology for any questions or concern

## 2021-08-28 NOTE — Progress Notes (Signed)
Patient: [name] MRN: [id number] Sex: male DOB: Apr 04, 1986  Provider: Franco Nones, MD Location of Care: Pediatric Specialist- Pediatric Neurology Note type: return visit for follow up. Visit type: in-person Referral Source: Chesley Noon, MD Date of Evaluation: 09/02/2021 Chief Complaint: Migraine without aura, epilepsy follow up.   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  Currently, family states patient averages about 1-2 seizures per week. During one of his seizures 2 weeks ago (08/21/2021) fell out of bed and hit his head.  Typically has several different types of seizures- recently on 08/21/21 had generalized tonic-clonic seizures that lasted about 3-4 minutes, was able to breathe through these. Sometimes will need to sit him up because he stops breathing and becomes unconscious and he will need rescue breathing. Sometimes skin changes blue. Can normally give 4-5 rescue breaths before he starts breathing on his own. Does not give any medication during long seizures due to them not working. Will give phenobarb and Ativan if he has more seizures in a 24 hour period. If has to give Ativan will bring him to the hospital because they know it will affect his breathing. Family states they still give IV phenobarb through his port. States he has a lot of mucous secretion during seizures. Neck swells which impacts breathing. Denies seeing redness   Has had 7 seizures since last seen with Dr Gaynell Face in 05/29/2021. Occasionally will go 1 week without having seizures. Sometimes when he gets excited it will trigger seizures.  Last seizure where he stopped breathing was 05/27/2021 which was the last time EMS was called but was not transferred to ED.   Different types of seizures:  Thrashing seizure: Sometimes swiping  the magnet on VNS will help when he has this type of seizure. This thrashing does not result in loss of consciousness but he does not respond. Typically lasts a minute and a half  Aura: patient can tell he is about to have a seizure. Family will give Diazepam 2mg  when he gets this feeling. Seizure dog also helps to alert about seizures Generalized stiffening (dominant)- see details above Does not shake, just stiffens and HR increases (has pulse ox at home) reaching max 130.  Feels they have not seen as many seizures with the increase in Vimpat to 300 mg a day (100 mg in am and 200 mg in pm).  No migraine in about 2 months, stable on Trokendi 250mg  at bedtime  Parents States Dr. Marcial Pacas typically prescribes the medication. IV phenobarb and Ativan prescribed by our office.  Family expresses feeling more at ease and comfortable with having 2 neurologists. Expresses being aware of having different providers when hospitalized but states Dr. Gaynell Face was main point of contact. Family states that they are firm when patient is hospitalized in telling the providers they know what works for him (they carry phenobarbital and ativan on them at all times).   Epilepsy/seizure History:   Age at seizure onset: [birth year] left locomotor with secondary generalization.  Description of all seizure types and duration: typically with generalized posturing and stiffening   Complications from seizures (trauma, etc.): trauma (hitting head), apneic episodes.   h/o status epilepticus? Yes- last in September (had 3 seizures in a row).  Date of most recent seizure: Approx 2 weeks ago- end of November 2022.  Seizure frequency past month (exact number or average  per day): 4 Past 3 months: Grand mal seizures (3-4 minutes) with stiffening.   Past year: June he had 2 days of continuous migraine on the 15th and 16th into minute and a half seizures July he had 4 seizures, at least 1 generalized tonic-clonic and 5 days of  migraine that began after Vimpat was increased at bedtime.   In August he had a total of 6 seizures the day after missing his medication at nighttime his mother accessed his port and medicated him with phenobarbital and Ativan for treatment that has worked for the last 20 years.  He had 2 more seizures in August 2022. On May 27, 2021 for the first time in 10 days he had a seizure where he fell straight backwards while standing and struck his head.  His father reached him and noted that he was apneic he did 3-4 rescue breaths and [name] came around.  EMS evaluated him and felt that he was at baseline and probably did not need to be transported to the hospital.  Current AEDs:  Tegretol 150mg  TID  Vimpat 100am, 200 pm Clobazam 10mg  am, 10 mg afternoon, 20mg  pm  Keppra 1000 am, 1250 pm Phenobarb prn for recurrent seizures.  Current side effects: Feels groggy and staggering with Tegretol (decreased dose improved sx).   Other Meds:  Levothyroxine 9mcg daily  Trokendi 250mg  pm   rosuvastatin 10mg  pm Phenergan, Kcl  MVI 10mg  pm  Guanfacine   Interrogation of the vagal nerve stimulator:  VNS was implanted on 09/27/2020 and device is demi pulse Duo M104. Serial [id number]. Output status 2.25 mA, lead impedance 1626 ohms generator battery 75 to 100% total magnet activations 11,116.   Normal mode output current 2.25 mA, frequency 20 Hz, pulse width 250 s, on time stimulation 30 seconds, off time 1.1 minutes duty cycle 35%.  Magnet mode output current 2.5 mA, pulse width 250 s, on time stimulation 60 seconds.  Lead Impedance 1613 Ohms. Battery 75%-100%. Total Magnet Activation 15051.   Brief History: Copied from previous note:  Diagnosis of developmental delay as a toddler.   EEG 11/07/86 was normal for gestational age.   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    Initial seizure  08/28/94 left locomotor with secondary generalization.   MRI of the brain 09/13/93: Scattered subcortical white matter lesions at the parietotemporal parieto-occipital junction is ischemic versus hamartomas.   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   Diagnosis attention deficit disorder inattentive type treated without success with Cylert April 1995.    MRI brain 04/04/94 stable differential diagnosis hamartoma, vasculitis, ischemic, or embolic phenomenon.   Status epilepticus 01/31/95, and 04/15/95 Neurontin added to Tegretol.   MRI brain 06/14/96 unchanged.   Admission to [facility]. Tegretol 30 mcg for militate. Neurontin discontinued, Lamictal started.   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    10/16/97 Tegretol plus Felbatol.   Protropin discontinued 10/13/97 frequency of seizures.from 4 per day down to one every other day and then 1-2 per week.   Topiramate started in May 1999 unable to be tolerated with Tegretol and was tapered and discontinued.   January 2000 EEG showed right greater than left mid temporal sharp waves was otherwise well-organized EEG.  He hospitalizations in March, July, October, in November for recurrent seizures.    Patient placed on Depakote in April 2000 and developed gagging abdominal pain headache and had significant change in transaminases and liver functions. Topamax was restarted and Keppra was added.    Vimpat was started July 16, 2012 and adjusted upwards.   [name] was added on November 18, 2012 and has been adjusted upwards. Topamax was changed to Trokendi XR in attempt to deal with sleepiness, unsteadiness caused by polypharmacy. We to these medications have been introduced, the patient experiences somewhat  improved seizure control however Is quite likely that despite better seizure control, the patient is experiencing impairment in cognition and gait as a result of polypharmacy .   MRI of the brain on March 21, 2013. The patient has extensive calcification to the basal ganglia bilaterally, normal ventricle size, and no acute findings. EKG was unchanged.  Last visit at [facility] on 11/7 family opted not to increase antiseizure medications.  PMH: hypothyroidism, growth hormone deficiency (growth hormone med stopped at 11 due to seizures), Intellectual disability, OSA on CPAP, and refractory epilepsy s/p VNS.   Surgical History      Past Surgical History:  Procedure Laterality Date   ANKLE SURGERY Left      fused does not bend, wears right leg brace   BIOPSY   08/22/2019    Procedure: BIOPSY;  Surgeon: Mauri Pole, MD;  Location: [facility];  Service: Endoscopy;;   COLONOSCOPY WITH PROPOFOL N/A 08/22/2019    Procedure: COLONOSCOPY WITH PROPOFOL;  Surgeon: Mauri Pole, MD;  Location: [facility];  Service: Endoscopy;  Laterality: N/A;   EYE SURGERY        X2 for Brown Syndrome   HEMOSTASIS CLIP PLACEMENT   08/22/2019    Procedure: HEMOSTASIS CLIP PLACEMENT;  Surgeon: Mauri Pole, MD;  Location: [facility];  Service: Endoscopy;;   IMPLANTATION VAGAL NERVE STIMULATOR        Left leaves on for surgeries   POLYPECTOMY   08/22/2019    Procedure: POLYPECTOMY;  Surgeon: Mauri Pole, MD;  Location: [facility];  Service: Endoscopy;;   PORTACATH PLACEMENT   2013 and removed and new placed    right chest      Family History family history includes Breast cancer in his mother; Clotting disorder in his mother; Colon polyps in his father and mother; Congestive Heart Failure in his maternal grandfather; Coronary artery disease in his maternal grandfather and paternal grandfather; Diabetes in his father; Hyperlipidemia in his father; Hypertension in his father; Lung cancer  in his paternal grandfather; Pneumonia in his maternal grandfather and paternal grandmother; Stroke in his paternal grandmother. Family history is negative for migraines, seizures, intellectual disabilities, blindness, deafness, birth defects, chromosomal disorder, or autism.   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  He lives with his parents.     He enjoys helping around the house, Nascar, and football.    Allergies      Allergen Reactions   Antihistamines, Chlorpheniramine-Type Other (See Comments)      Other Reaction: Dimetapp causes seizures   Divalproex Sodium Other (See Comments)      Nausea, vomiting, liver and kidney dysfunction   Phenytoin Rash and Other (See Comments)      Dilantin causes more seizures.     Valproic Acid And Related Other (See Comments)      Shuts down systems   Vancomycin Rash, Other (See Comments) and Swelling      If given too fast, causes red man reaction Other Reaction: Redman's Syndrome    Birth History:  He was a breech presentation, cesarean section delivery. He had intrauterine growth retardation, and failure to thrive.  Review of Systems: There is no history of fevers, chills, malaise, loss of appetite, weight loss, or difficulty sleeping.  Ophthalmologic, otolaryngologic, dermatologic, cardiovascular, gastrointestinal, genitourinary, musculoskeletal, psychiatric, and hematologic review of systems were  negative.  + OSA, + Hypothyroidism.  EXAMINATION Physical examination:  BP 90/78   Pulse 88   Ht 5' 2.48" (1.587 m)   Wt 134 lb 0.6 oz (60.8 kg)   BMI 24.14 kg/m    General examination: alert and active in no apparent distress. There are no dysmorphic features.   Chest examination reveals normal breath sounds, and normal heart sounds with no cardiac murmur.  Abdominal examination does not show any evidence of hepatic or splenic enlargement, or any abdominal masses or bruits. Skin evaluation does not reveal any caf-au-lait spots, hypo or hyperpigmented lesions, hemangiomas or pigmented nevi.  Mild scoliosis.  Neurologic examination: Awake, alert, able to follow commands. Cranial nerves: Pupils are symmetric, circular and reactive to light.  Extraocular movements are full in range, with no strabismus.  There is no ptosis or nystagmus. Palatal movements are symmetric.  The tongue is midline. Motor assessment: The tone is normal.  Movements are symmetric in all four extremities, with no evidence of any focal weakness.  Power is more than Perez / V in all groups of muscles across all major joints.  There is no evidence of atrophy or hypertrophy of muscles.  Deep tendon reflexes are 3+ and symmetric at the biceps, knees and ankles.  Plantar response is flexor bilaterally. Sensory examination:  formal sensory examination is limited.  Co-ordination and gait:  clawhand bilaterally and unable to fully straighten his finger, Finger-to-nose testing fair bilaterally.  Clumsy finger movements and rapid alternating movements. Gait broad based, antalgic and diplegic gait but Balance is fair. Has AFO in right leg.   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 Discussed potentially increasing Vimpat and family  opted to wait and discuss with [place] neurologist. Discussed that there is not a need for 2 neurologists, but due to family's preference and feeling more comfortable with patient having a neurologist close to home, will still see patient every 6 months.  Migraine without aura: he is currently taking Trokendi XR 250 mg at bedtime. No migraine headache for the last 2 months.   Plan: Continue AEDs as prescribed Continue Trokendi  XR 250 mg at bedtime.  Follow up in 6-7 months, in June 2023 Follow up with Dr Jeri Lager at [facility]  as scheduled every 3 months.  Call neurology for any questions or concern  Total time spent with the patient was 40 minutes, of which 50% or more was spent in counseling and coordination of care.   The plan of care was discussed, with acknowledgement of understanding expressed by his parents.   Franco Nones, MD Child Neurology and epilepsy

## 2021-09-02 ENCOUNTER — Other Ambulatory Visit (INDEPENDENT_AMBULATORY_CARE_PROVIDER_SITE_OTHER): Payer: Self-pay | Admitting: Family

## 2021-09-02 DIAGNOSIS — G40319 Generalized idiopathic epilepsy and epileptic syndromes, intractable, without status epilepticus: Secondary | ICD-10-CM

## 2021-09-03 NOTE — Telephone Encounter (Signed)
Spoke to mom per DR A message. She states understanding.

## 2021-10-21 ENCOUNTER — Other Ambulatory Visit (INDEPENDENT_AMBULATORY_CARE_PROVIDER_SITE_OTHER): Payer: Self-pay | Admitting: Family

## 2021-10-21 DIAGNOSIS — G40119 Localization-related (focal) (partial) symptomatic epilepsy and epileptic syndromes with simple partial seizures, intractable, without status epilepticus: Secondary | ICD-10-CM

## 2021-10-21 DIAGNOSIS — G40319 Generalized idiopathic epilepsy and epileptic syndromes, intractable, without status epilepticus: Secondary | ICD-10-CM

## 2021-10-21 MED ORDER — VIMPAT 100 MG PO TABS: ORAL_TABLET | ORAL | 1 refills | Status: DC

## 2021-10-21 NOTE — Telephone Encounter (Signed)
Mom requested refill because [name] is out of medication. She paid $105 for 5 pills to get through the weekend. TG

## 2021-10-23 ENCOUNTER — Ambulatory Visit: Payer: Medicaid Other | Admitting: Cardiovascular Disease

## 2021-11-05 ENCOUNTER — Other Ambulatory Visit (INDEPENDENT_AMBULATORY_CARE_PROVIDER_SITE_OTHER): Payer: Self-pay | Admitting: Family

## 2021-11-05 DIAGNOSIS — G40319 Generalized idiopathic epilepsy and epileptic syndromes, intractable, without status epilepticus: Secondary | ICD-10-CM

## 2022-01-21 ENCOUNTER — Other Ambulatory Visit (INDEPENDENT_AMBULATORY_CARE_PROVIDER_SITE_OTHER): Payer: Self-pay | Admitting: Family

## 2022-01-21 DIAGNOSIS — E876 Hypokalemia: Secondary | ICD-10-CM

## 2022-01-21 MED ORDER — POTASSIUM CHLORIDE CRYS ER 20 MEQ PO TBCR: 20.0000 meq | EXTENDED_RELEASE_TABLET | Freq: Every day | ORAL | 1 refills | Status: DC

## 2022-01-30 ENCOUNTER — Other Ambulatory Visit (INDEPENDENT_AMBULATORY_CARE_PROVIDER_SITE_OTHER): Payer: Self-pay | Admitting: Pediatrics

## 2022-01-30 DIAGNOSIS — G40401 Other generalized epilepsy and epileptic syndromes, not intractable, with status epilepticus: Secondary | ICD-10-CM

## 2022-01-30 DIAGNOSIS — G40319 Generalized idiopathic epilepsy and epileptic syndromes, intractable, without status epilepticus: Secondary | ICD-10-CM

## 2022-01-30 MED ORDER — LORAZEPAM 2 MG/ML IJ SOLN: 2.0000 mg | INTRAMUSCULAR | 5 refills | Status: DC

## 2022-01-30 MED ORDER — PHENOBARBITAL SODIUM 65 MG/ML IJ SOLN: INTRAMUSCULAR | 5 refills | Status: DC

## 2022-01-30 NOTE — Telephone Encounter (Signed)
?  Name of who is calling: ?[facility] ?Caller's Relationship to Patient: ? ?Best contact number: ?[phone] ?Provider they see: ?Abdelmoumen ?Reason for call: ? ?Please send in rx for patient TODAY! ? [place] TO [phone] ? ? ?PRESCRIPTION REFILL ONLY ? ?Name of prescription: ?PHENOBARBILAL ATIVAN ?Pharmacy: ? ? ?

## 2022-01-31 NOTE — Progress Notes (Signed)
? ? ?Chief Complaint  ?Patient presents with  ? Follow-up  ?  Mitral regurgitation  ?  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 He was initially seen as a new consult for his atrial fibrillation by Dr. Jolyn Nap on 04/08/12. He was admitted on 04/08/12 to get a [name] replacement. They removed the old [name] but were not able to insert a new one because of right-sided central venous occlusion. During wire manipulatoin, he developed rapid atrial fibrillation.  In PACU, the atrial fibrillation resolved without medical intervention. He has had no atrial fibrillation since then. He has frequent seizures for many years. Cardiac monitor in 2015 showed sinus rhythm with no arrhythmias. Last echo May 2021 with LVEF=55-60%, mild to moderate mitral regurgitation. ? ?He is here today for follow up. The patient denies any chest pain, dyspnea, palpitations, lower extremity edema, orthopnea, PND, dizziness, near syncope or syncope.  ?  ?Primary Care Physician: Chesley Noon, MD ? ? ?Past Medical History:  ?Diagnosis Date  ? ALLERGIC RHINITIS 12/26/2009  ? Blood clot in vein 2015  ? Brown's syndrome   ? Complication of anesthesia   ? seizure after pac surgery at [facility] had to be intubated, no other anes issurs  ? Growth disorder   ? Headache   ? migraines  ? Heart murmur   ? moderate MR ses dr Darcus Pester  ? Hyperlipidemia   ? No therapy  ? Hypokalemia   ? 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 MITRAL VALVE PROLAPSE 06/22/2007  ? SEIZURE DISORDER 06/22/2007  ? last seizure 08-02-2019  ? Sleep apnea   ? cpap , last sleep study 10/01/11  ? Unspecified hypothyroidism 06/22/2007  ? ? ?Past Surgical History:  ?Procedure Laterality Date  ? ANKLE SURGERY Left   ? fused does not bend, wears right leg  brace  ? BIOPSY  08/22/2019  ? Procedure: BIOPSY;  Surgeon: Mauri Pole, MD;  Location: [facility];  Service: Endoscopy;;  ? COLONOSCOPY WITH PROPOFOL N/A 08/22/2019  ? Procedure: COLONOSCOPY WITH PROPOFOL;  Surgeon: Mauri Pole, MD;  Location: [facility];  Service: Endoscopy;  Laterality: N/A;  ? EYE SURGERY    ? X2 for [employer] Syndrome  ? HEMOSTASIS CLIP PLACEMENT  08/22/2019  ? Procedure: HEMOSTASIS CLIP PLACEMENT;  Surgeon: Mauri Pole, MD;  Location: [facility];  Service: Endoscopy;;  ? IMPLANTATION VAGAL NERVE STIMULATOR    ? Left leaves on for surgeries  ? POLYPECTOMY  08/22/2019  ? Procedure: POLYPECTOMY;  Surgeon: Mauri Pole, MD;  Location: [facility];  Service: Endoscopy;;  ? PORTACATH PLACEMENT  2013 and removed and new placed  ? right chest  ? ? ?Current Outpatient Medications  ?Medication Sig Dispense Refill  ? acetaminophen (TYLENOL) 500 MG tablet Take 1,000 mg by mouth every 6 (six) hours as needed for headache.    ? albuterol (ACCUNEB) 1.25 MG/3ML nebulizer solution Take 1 ampule by nebulization every 4 (four) hours as needed for wheezing.    ? carbamazepine (TEGRETOL) 100 MG chewable tablet CHEW 2 TABLETS IN THE MORNING, 2 TABLETS AT MIDDAY AND 1 & 1/2 TABLETS IN THE EVENING (Patient taking differently: CHEW 1 1/2TABLETS IN THE MORNING, 1 1/2 TABLETS AT MIDDAY AND 1 & 1/2 TABLETS IN THE EVENING) 170 tablet 5  ? cetirizine (ZYRTEC) 10  MG tablet Take 10 mg by mouth daily.    ? Dextromethorphan-guaiFENesin 5-100 MG/5ML LIQD Take 10 mLs by mouth 2 (two) times daily as needed (cough).    ? diazepam (VALIUM) 2 MG tablet Take 1 tablet every 6 - 8 hours as needed for anxiety or seizures 30 tablet 5  ? Flaxseed, Linseed, (FLAXSEED OIL) 1000 MG CAPS Take 1,000 mg by mouth daily.    ? guaiFENesin (MUCINEX) 600 MG 12 hr tablet Take 600 mg by mouth 2 (two) times daily.    ? Krill Oil (OMEGA-3) 500 MG CAPS Take 500 mg by mouth daily.    ? levETIRAcetam (KEPPRA) 500 MG tablet  Take 500 mg by mouth at bedtime. Takes 2 tablets in the morning and 2.5 tablets in the evening    ? levothyroxine (SYNTHROID) 50 MCG tablet Take 50 mcg by mouth daily before breakfast.    ? LORazepam (ATIVAN) 2 MG/ML injection Inject 1 mL (2 mg total) into the vein See admin instructions. Dilute '2mg'$  of lorazepam with 17m of saline.  Administer IV push over 3-5 minutes as needed for seizures. 1 mL 5  ? montelukast (SINGULAIR) 10 MG tablet Take 10 mg by mouth at bedtime.  6  ? Multiple Vitamin (MULTIVITAMIN WITH MINERALS) TABS Take 1 tablet by mouth daily.    ? ondansetron (ZOFRAN-ODT) 4 MG disintegrating tablet Take 1 tablet as needed for nausea and vomiting (Patient taking differently: Take 4 mg by mouth every 8 (eight) hours as needed (nausea and vomiting related to migraines).) 20 tablet 5  ? [name] 10 MG tablet TAKE 1 TABLET BY MOUTH IN THE MORNING, 1 IN THE AFTERNOON AND 2 AT BEDTIME 124 tablet 5  ? PHENObarbital (LUMINAL) 65 MG/ML injection Withdraw 121m('65mg'$ ) Phenobarbital and 90m20mormal Saline into 5ml55mringe. Adminster IV push for 3-5 minutes as needed for recurrent seizures 5 mL 5  ? potassium chloride SA (KLOR-CON M20) 20 MEQ tablet Take 1 tablet (20 mEq total) by mouth daily. 31 tablet 1  ? potassium phosphate, monobasic, (K-PHOS) 500 MG tablet Take 1 tablet (500 mg total) by mouth 4 (four) times daily. 124 tablet 5  ? promethazine (PHENERGAN) 25 MG suppository Place 25 mg rectally every 6 (six) hours as needed for nausea or vomiting.     ? rosuvastatin (CRESTOR) 10 MG tablet Take 10 mg by mouth at bedtime.  5  ? Topiramate ER (TROKENDI XR) 200 MG CP24 Take 1 capsule by mouth at bedtime. 90 capsule 3  ? TROKENDI XR 50 MG CP24 Take 1 capsule at nighttime 31 capsule 5  ? VIMPAT 100 MG TABS TAKE 1 TABLET IN THE MORNING AND TAKE 2 TABLETS AT NIGHT 270 tablet 1  ? NAYZILAM 5 MG/0.1ML SOLN Take 5 mg after 2 minutes of seizures, may repeat in 10 minutes if seizures persist. (Patient not taking: Reported on  08/28/2021) 2 each 5  ? NURTEC 75 MG TBDP Take 1 tablet at onset of migraine (Patient not taking: Reported on 08/28/2021) 8 tablet 5  ? ?No current facility-administered medications for this visit.  ? ? ?Allergies  ?Allergen Reactions  ? Antihistamines, Chlorpheniramine-Type Other (See Comments)  ?  Other Reaction: Dimetapp causes seizures  ? Divalproex Sodium Other (See Comments)  ?  Nausea, vomiting, liver and kidney dysfunction  ? Phenytoin Rash and Other (See Comments)  ?  Dilantin causes more seizures. ?  ? Valproic Acid And Related Other (See Comments)  ?  Shuts down systems  ? Vancomycin Rash,  Other (See Comments) and Swelling  ?  If given too fast, causes red man reaction ?Other Reaction: Redman's Syndrome  ? ? ?Social History  ? ?Socioeconomic History  ? Marital status: Single  ?  Spouse name: Not on file  ? Number of children: 0  ? Years of education: Not on file  ? Highest education level: Not on file  ?Occupational History  ? Occupation: Disabled  ?  Employer: UNEMPLOYED  ?Tobacco Use  ? Smoking status: Never  ? Smokeless tobacco: Never  ?Vaping Use  ? Vaping Use: Never used  ?Substance and Sexual Activity  ? Alcohol use: No  ?  Alcohol/week: 0.0 standard drinks  ? Drug use: No  ? Sexual activity: Never  ?Other Topics Concern  ? Not on file  ?Social History Narrative  ? 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   ? 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   ? He lives with his parents.  ?  He enjoys helping around the house, Nascar, and football.  ? ?Social Determinants of Health  ? ?Financial Resource Strain: Not on file  ?Food Insecurity: Not on file  ?Transportation Needs: Not on file  ?Physical Activity: Not on file  ?Stress: Not on file  ?Social Connections: Not on file  ?Intimate Partner Violence: Not on file  ? ? ?Family History  ?Problem Relation Age of Onset  ? Breast cancer Mother   ? Colon polyps Mother   ? Clotting disorder Mother   ? Diabetes Father   ? Hypertension Father   ?  Colon polyps Father   ? Hyperlipidemia Father   ? Coronary artery disease Maternal Grandfather   ? Congestive Heart Failure Maternal Grandfather   ?     Died at 11  ? Pneumonia Maternal Grandfather   ? [employer]

## 2022-02-03 ENCOUNTER — Encounter: Payer: Self-pay | Admitting: Cardiovascular Disease

## 2022-02-03 ENCOUNTER — Ambulatory Visit (INDEPENDENT_AMBULATORY_CARE_PROVIDER_SITE_OTHER): Payer: Medicaid Other | Admitting: Cardiovascular Disease

## 2022-02-03 VITALS — BP 100/68 | HR 67 | Ht 62.45 in | Wt 129.6 lb

## 2022-02-03 DIAGNOSIS — I480 Paroxysmal atrial fibrillation: Secondary | ICD-10-CM

## 2022-02-03 DIAGNOSIS — I340 Nonrheumatic mitral (valve) insufficiency: Secondary | ICD-10-CM | POA: Diagnosis not present

## 2022-02-03 NOTE — Telephone Encounter (Signed)
Prescription has been sent to pharmacy.

## 2022-02-03 NOTE — Patient Instructions (Signed)
Medication Instructions:  No changes *If you need a refill on your cardiac medications before your next appointment, please call your pharmacy*   Lab Work: none If you have labs (blood work) drawn today and your tests are completely normal, you will receive your results only by: MyChart Message (if you have MyChart) OR A paper copy in the mail If you have any lab test that is abnormal or we need to change your treatment, we will call you to review the results.   Testing/Procedures: none   Follow-Up: At [facility], you and your health needs are our priority.  As part of our continuing mission to provide you with exceptional heart care, we have created designated Provider Care Teams.  These Care Teams include your primary Cardiologist (physician) and Advanced Practice Providers (APPs -  Physician Assistants and Nurse Practitioners) who all work together to provide you with the care you need, when you need it.  We recommend signing up for the patient portal called "MyChart".  Sign up information is provided on this After Visit Summary.  MyChart is used to connect with patients for Virtual Visits (Telemedicine).  Patients are able to view lab/test results, encounter notes, upcoming appointments, etc.  Non-urgent messages can be sent to your provider as well.   To learn more about what you can do with MyChart, go to [email or web address].    Your next appointment:   12 month(s)  The format for your next appointment:   In Person  Provider:   Christopher McAlhany, MD     Important Information About Sugar       

## 2022-02-27 ENCOUNTER — Ambulatory Visit (INDEPENDENT_AMBULATORY_CARE_PROVIDER_SITE_OTHER): Payer: Medicaid Other | Admitting: Pediatrics

## 2022-03-18 ENCOUNTER — Ambulatory Visit (INDEPENDENT_AMBULATORY_CARE_PROVIDER_SITE_OTHER): Payer: Medicaid Other | Admitting: Pediatrics

## 2022-04-01 ENCOUNTER — Encounter (INDEPENDENT_AMBULATORY_CARE_PROVIDER_SITE_OTHER): Payer: Self-pay | Admitting: Pediatrics

## 2022-04-01 ENCOUNTER — Ambulatory Visit (INDEPENDENT_AMBULATORY_CARE_PROVIDER_SITE_OTHER): Payer: Medicaid Other | Admitting: Pediatrics

## 2022-04-01 VITALS — BP 110/84 | Wt 128.0 lb

## 2022-04-01 DIAGNOSIS — F70 Mild intellectual disabilities: Secondary | ICD-10-CM

## 2022-04-01 DIAGNOSIS — G4733 Obstructive sleep apnea (adult) (pediatric): Secondary | ICD-10-CM | POA: Diagnosis not present

## 2022-04-01 DIAGNOSIS — G43009 Migraine without aura, not intractable, without status migrainosus: Secondary | ICD-10-CM | POA: Diagnosis not present

## 2022-04-01 DIAGNOSIS — G40119 Localization-related (focal) (partial) symptomatic epilepsy and epileptic syndromes with simple partial seizures, intractable, without status epilepticus: Secondary | ICD-10-CM | POA: Diagnosis not present

## 2022-04-01 DIAGNOSIS — G40319 Generalized idiopathic epilepsy and epileptic syndromes, intractable, without status epilepticus: Secondary | ICD-10-CM

## 2022-04-01 DIAGNOSIS — R2689 Other abnormalities of gait and mobility: Secondary | ICD-10-CM

## 2022-04-01 NOTE — Progress Notes (Signed)
Patient: [name] MRN: [id number] Sex: male DOB: 1986/07/28  Provider: Franco Nones, MD Location of Care: Pediatric Specialist- Pediatric Neurology Note type: return visit for follow up. Visit type: in-person Referral Source: Chesley Noon, MD Date of Evaluation: 04/01/2022 Chief Complaint: Migraine without aura, epilepsy follow up.   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  [name] in child neurology clinic in December 2022. He is followed by adult neurology at [facility] Dr Jeoffrey Massed for his refractory epilepsy. [name] in this practice and family want to stay to help with his neurological needs or if he is admitted.   His mother has seizure logs: April 2032: [name] described as full body stiffness like locks up and gets facial puffiness with difficulty breathing that lasted 3 minutes in average.   May 2023; had 4 Perez brief Perez started quick with preserved awareness. He was making noises and left arm movement like trashing limp in the air. He called his family. This lasted few seconds.   June 2023 had 2 Perez with stiffness that lasted 3 minutes. Overall, family did not use or has to use Ativan or phenobarbital as rescue medications. They had tried once to access his port to give medication but never did.  July 2023: no Perez till this visit.   His family thinks they are seeing some improvement. He does not have frequent Perez as before. He is taking and tolerating Vimpat 100 mg in the morning and 200 mg at night, keppra 1000 mg in the morning and at thousand 250 mg nightly and carbamazepine 150 mg 3 times a day.  He also takes clobazam 10 mg in the morning, 10 mg in the afternoon and 20 at night.  I could not find  his antiseizure level results requested by his adult neurology at [facility].  Migraine: He still has migraine and take Trokendi 250 mg at bedtime.   Family expresses feeling more at ease and comfortable with having 2 neurologists. Expresses being aware of having different providers when hospitalized but states Dr. Gaynell Perez was main point of contact. Family states that they are firm when patient is hospitalized in telling the providers they know what works for him (they carry phenobarbital and ativan on them at all times).   Epilepsy/seizure History:   Age at seizure onset: 08/28/94 left locomotor with secondary generalization.  Description of all seizure types and duration:  Seizure semiology:  Thrashing seizure: Sometimes swiping the magnet on VNS will help when he has this type of seizure. This thrashing does not result in loss of consciousness but he does not respond. Typically lasts a minute and a half  Aura: patient can tell he is about to have a seizure. Family will give Diazepam '2mg'$  when he gets this feeling. Seizure dog also helps to alert about Perez Generalized stiffening (dominant)- see details above Does not shake, just stiffens and HR increases (has pulse ox at home) reaching max 130.  Complications from Perez (trauma, etc.): trauma (hitting head), apneic episodes.   h/o status epilepticus?; none  Current AEDs:  Oxcarbazepine '150mg'$  TID  Vimpat 100am, 200 pm Clobazam '10mg'$  am, 10 mg afternoon, '20mg'$  pm  Keppra 1000 am, 1250 pm Phenobarb prn for recurrent Perez.  Current side effects: Feels groggy and staggering with Tegretol (decreased dose improved  sx).   Other Meds:  Levothyroxine 15mg daily  Trokendi '250mg'$  pm   rosuvastatin '10mg'$  pm Phenergan, Kcl  MVI '10mg'$  pm  Guanfacine   Interrogation of the vagal nerve stimulator:  VNS was implanted on 09/27/2020 and device is demi pulse Duo M104. Serial [id number] Output status 2.25 mA, lead impedance 1626 ohms generator  battery 75 to 100% total magnet activations 11,116.   Normal mode output current 2.25 mA, frequency 20 Hz, pulse width 250 s, on time stimulation 30 seconds, off time 1.1 minutes duty cycle 35%.  Magnet mode output current 2.5 mA, pulse width 250 s, on time stimulation 60 seconds.  Lead Impedance 1626 Ohms. Battery 50-75%. Total Magnet Activation [id number]   Brief History: Copied from previous note:  Diagnosis of developmental delay as a toddler.   EEG 11/07/86 was normal for gestational age.   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    Initial seizure 08/28/94 left locomotor with secondary generalization.   MRI of the brain 09/13/93: Scattered subcortical white matter lesions at the parietotemporal parieto-occipital junction is ischemic versus hamartomas.   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   Diagnosis attention deficit disorder inattentive type treated without success with Cylert April 1995.    MRI brain 04/04/94 stable differential diagnosis hamartoma, vasculitis, ischemic, or embolic phenomenon.   Status epilepticus 01/31/95, and 04/15/95 Neurontin added to Tegretol.   MRI brain 06/14/96 unchanged.   [facility] 30 mcg for militate. Neurontin discontinued, Lamictal started.   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    10/16/97 Tegretol plus Felbatol.   Protropin discontinued 10/13/97 frequency of Perez.from 4 per day down to one every other day and then 1-2 per week.   Topiramate started in May 1999 unable to be tolerated with Tegretol and was tapered and discontinued.   January 2000 EEG showed right  greater than left mid temporal sharp waves was otherwise well-organized EEG. He hospitalizations in March, July, October, in November for recurrent Perez.    Patient placed on Depakote in April 2000 and developed gagging abdominal pain headache and had significant change in transaminases and liver functions. Topamax was restarted and Keppra was added.    Vimpat was started July 16, 2012 and adjusted upwards.   [name] was added on November 18, 2012 and has been adjusted upwards. Topamax was changed to Trokendi XR in attempt to deal with sleepiness, unsteadiness caused by polypharmacy. We to these medications have been introduced, the patient experiences somewhat improved seizure control however Is quite likely that despite better seizure control, the patient is experiencing impairment in cognition and gait as a result of polypharmacy .   MRI of the brain on March 21, 2013. The patient has extensive calcification to the basal ganglia bilaterally, normal ventricle size, and no acute findings. EKG was unchanged.  [facility] opted not to increase antiseizure medications.  PMH: hypothyroidism, growth hormone deficiency (growth hormone med stopped at 11 due to Perez), Intellectual disability, OSA on CPAP, and refractory epilepsy s/p VNS.   Surgical History      Past Surgical History:  Procedure Laterality Date   ANKLE SURGERY Left      fused does not  bend, wears right leg brace   BIOPSY   08/22/2019    Procedure: BIOPSY;  Surgeon: Mauri Pole, MD;  [facility]: [facility];  Service: Endoscopy;;   COLONOSCOPY WITH PROPOFOL N/A 08/22/2019    Procedure: COLONOSCOPY WITH PROPOFOL;  Surgeon: Mauri Pole, MD;  Location: [facility];  Service: Endoscopy;  Laterality: N/A;   EYE SURGERY        X2 for Brown Syndrome   HEMOSTASIS CLIP PLACEMENT   08/22/2019    Procedure: HEMOSTASIS CLIP PLACEMENT;  Surgeon: Mauri Pole, MD;  [facility];   Service: Endoscopy;;   IMPLANTATION VAGAL NERVE STIMULATOR        Left leaves on for surgeries   POLYPECTOMY   08/22/2019    Procedure: POLYPECTOMY;  Surgeon: Mauri Pole, MD;  [facility];  Service: Endoscopy;;   PORTACATH PLACEMENT   2013 and removed and new placed    right chest      Family History family history includes Breast cancer in his mother; Clotting disorder in his mother; Colon polyps in his father and mother; Congestive Heart Failure in his maternal grandfather; Coronary artery disease in his maternal grandfather and paternal grandfather; Diabetes in his father; Hyperlipidemia in his father; Hypertension in his father; Lung cancer in his paternal grandfather; Pneumonia in his maternal grandfather and paternal grandmother; Stroke in his paternal grandmother. Family history is negative for migraines, Perez, intellectual disabilities, blindness, deafness, birth defects, chromosomal disorder, or autism.   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     He lives with his parents.     He enjoys helping around the house, Nascar, and football.    Allergies      Allergen Reactions   Antihistamines, Chlorpheniramine-Type Other (See Comments)      Other Reaction: Dimetapp causes Perez   Divalproex Sodium Other (See Comments)      Nausea, vomiting, liver and kidney dysfunction   Phenytoin Rash and Other (See Comments)       Dilantin causes more Perez.     Valproic Acid And Related Other (See Comments)      Shuts down systems   Vancomycin Rash, Other (See Comments) and Swelling      If given too fast, causes red man reaction Other Reaction: Redman's Syndrome    Birth History:  He was a breech presentation, cesarean section delivery. He had intrauterine growth retardation, and failure to thrive.  Review of Systems: There is no history of fevers, chills, malaise, loss of appetite, weight loss, or difficulty sleeping.  Ophthalmologic, otolaryngologic, dermatologic, cardiovascular, gastrointestinal, genitourinary, musculoskeletal, psychiatric, and hematologic review of systems were negative.  + OSA, + Hypothyroidism.  EXAMINATION Physical examination:  BP 110/84   Wt 128 lb (58.1 kg)   BMI 23.08 kg/m    General examination: alert and active  in no apparent distress. There are no dysmorphic features.   Chest examination reveals normal breath sounds, and normal heart sounds with no cardiac murmur.  Abdominal examination does not show any evidence of hepatic or splenic enlargement, or any abdominal masses or bruits. Skin evaluation does not reveal any caf-au-lait spots, hypo or hyperpigmented lesions, hemangiomas or pigmented nevi.  Mild scoliosis.  Neurologic examination: Awake, alert, able to follow commands. Cranial nerves: Pupils are symmetric, circular and reactive to light.  Extraocular movements are full in range, with no strabismus.  There is no ptosis or nystagmus. Palatal movements are symmetric.  The tongue is midline. Motor assessment: The tone is normal.  Movements are symmetric in all four extremities, with no evidence of any focal weakness.  Power is more than Perez / V in all groups of muscles across all major joints.  There is no evidence of atrophy or hypertrophy of muscles.  Deep tendon reflexes are 3+ and symmetric at the biceps, knees and ankles.  Plantar response is flexor  bilaterally. Sensory examination:  formal sensory examination is limited.  Co-ordination and gait:  clawhand bilaterally and unable to fully straighten his finger, Finger-to-nose testing fair bilaterally.  Clumsy finger movements and rapid alternating movements. Gait broad based, antalgic and diplegic gait but Balance is fair. Has AFO in right leg.   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 Previously discussed that there is not a need for 2 neurologists, but due to family's preference and feeling more comfortable with patient having a neurologist close to home, will still see patient every 6 months.  I could not find antiseizure levels in the chart.  I have discussed extensively about refractory epilepsy and risk of sudden unexpected death in epilepsy SUDEP which means that in a patient with epilepsy that is not due to trauma or other known causes which  is often evidence of an associated Perez.  I talked about exploring and adding more antiseizure medications to be discussed with adult neurology.  As mentioned above, I am not managing his refractory epilepsy and am here for family if needed refills or if he is admitted to coordinate as per their preference.  Migraine without aura: he is currently taking Trokendi XR 250 mg at bedtime. Still has migraine but would like to change his medications and explore more in future.   Plan: Continue AEDs as prescribed Continue Trokendi  XR 250 mg at bedtime.  Follow up in 6-7 months probably in December Follow up with Dr Jeri Lager at [facility] as scheduled  Call neurology for any questions or concern  Total time spent with the patient was 30 minutes, of which 50% or more was spent in counseling and coordination of care.   The plan of care was discussed, with  acknowledgement of understanding expressed by his parents.   Franco Nones, MD Child Neurology and epilepsy

## 2022-04-05 ENCOUNTER — Other Ambulatory Visit (INDEPENDENT_AMBULATORY_CARE_PROVIDER_SITE_OTHER): Payer: Self-pay | Admitting: Family

## 2022-04-05 DIAGNOSIS — E876 Hypokalemia: Secondary | ICD-10-CM

## 2022-04-07 ENCOUNTER — Other Ambulatory Visit (INDEPENDENT_AMBULATORY_CARE_PROVIDER_SITE_OTHER): Payer: Self-pay

## 2022-04-07 DIAGNOSIS — E876 Hypokalemia: Secondary | ICD-10-CM

## 2022-04-25 ENCOUNTER — Ambulatory Visit (INDEPENDENT_AMBULATORY_CARE_PROVIDER_SITE_OTHER): Payer: Medicaid Other | Admitting: Family

## 2022-04-25 ENCOUNTER — Encounter (INDEPENDENT_AMBULATORY_CARE_PROVIDER_SITE_OTHER): Payer: Self-pay | Admitting: Family

## 2022-04-25 VITALS — BP 100/70 | HR 86 | Ht 61.65 in | Wt 129.2 lb

## 2022-04-25 DIAGNOSIS — E038 Other specified hypothyroidism: Secondary | ICD-10-CM

## 2022-04-25 DIAGNOSIS — G43009 Migraine without aura, not intractable, without status migrainosus: Secondary | ICD-10-CM

## 2022-04-25 DIAGNOSIS — G4733 Obstructive sleep apnea (adult) (pediatric): Secondary | ICD-10-CM | POA: Diagnosis not present

## 2022-04-25 DIAGNOSIS — Z9689 Presence of other specified functional implants: Secondary | ICD-10-CM

## 2022-04-25 DIAGNOSIS — R2689 Other abnormalities of gait and mobility: Secondary | ICD-10-CM

## 2022-04-25 DIAGNOSIS — I059 Rheumatic mitral valve disease, unspecified: Secondary | ICD-10-CM

## 2022-04-25 DIAGNOSIS — G40119 Localization-related (focal) (partial) symptomatic epilepsy and epileptic syndromes with simple partial seizures, intractable, without status epilepticus: Secondary | ICD-10-CM | POA: Diagnosis not present

## 2022-04-25 DIAGNOSIS — G40319 Generalized idiopathic epilepsy and epileptic syndromes, intractable, without status epilepticus: Secondary | ICD-10-CM | POA: Diagnosis not present

## 2022-04-25 DIAGNOSIS — H5060 Mechanical strabismus, unspecified: Secondary | ICD-10-CM

## 2022-04-25 DIAGNOSIS — E039 Hypothyroidism, unspecified: Secondary | ICD-10-CM

## 2022-04-25 DIAGNOSIS — F70 Mild intellectual disabilities: Secondary | ICD-10-CM

## 2022-04-25 NOTE — Patient Instructions (Signed)
It was a pleasure to see you today!  Instructions for you until your next appointment are as follows: Continue using the CPAP for [name] as you have been doing.  I will order a sleep study to see if he needs changes in his CPAP settings I will contact [employer] about his equipment to see if it needs to be serviced or replaced Please sign up for MyChart if you have not done so. Please plan to return for follow up with Dr Coralie Keens in February or sooner if needed.  Feel free to contact our office during normal business hours at [phone] with questions or concerns. If there is no answer or the call is outside business hours, please leave a message and our clinic staff will call you back within the next business day.  If you have an urgent concern, please stay on the line for our after-hours answering service and ask for the on-call neurologist.     I also encourage you to use MyChart to communicate with me more directly. If you have not yet signed up for MyChart within [facility], the front desk staff can help you. However, please note that this inbox is NOT monitored on nights or weekends, and response can take up to 2 business days.  Urgent matters should be discussed with the on-call pediatric neurologist.   At [facility], we are committed to providing exceptional care. You will receive a patient satisfaction survey through text or email regarding your visit today. Your opinion is important to me. Comments are appreciated.

## 2022-04-25 NOTE — Progress Notes (Signed)
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   Today's concerns: [name] is seen today for face to face documentation of ongoing needed for CPAP for OSA. He uses the CPAP for both naps and night time sleeping. Without CPAP his oxygen saturations drop to low 80's when he is asleep. H  Mom has noted that [name] tends to go to sleep easily during the day if sitting quietly. These events are different from when he experienced sedation from anticonvulsant medications.   Mom wonders if the CPAP settings need to be changed as they have been unchanged since he started CPAP in 2013. He has gained weight since that time.   Mom also notes that his CPAP machine makes loud noise, whereas it used to run quietly. She asked how to get the machine serviced or changed.   [name] has been otherwise generally healthy since he was last seen. Neither he nor his mother have other health concerns for him today other than previously mentioned.  Review of systems: Please see HPI for neurologic and other pertinent review of systems. Otherwise all other systems were reviewed and were negative.  Problem List: Patient Active Problem List   Diagnosis Date Noted   Chronic migraine without aura with status migrainosus, not intractable 11/01/2020   Adenomatous polyp of transverse colon 09/17/2019    BRBPR (bright red blood per rectum) 09/17/2019   Polyp of sigmoid colon    Migraine without aura and without status migrainosus, not intractable 02/22/2018   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   Hypokalemia 08/03/2013   Hypothyroidism 08/02/2013   Subtherapeutic international normalized ratio (INR) 08/02/2013   Contracture of ankle and foot joint 03/29/2013   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[place]) 12/21/2012   Partial epilepsy with intractable epilepsy ([place]) 12/21/2012   Obstructive sleep apnea (adult) (pediatric) 12/21/2012   Acute venous embolism and thrombosis of subclavian veins ([place]) 12/21/2012   Acute venous embolism and thrombosis of internal jugular veins ([facility]) 12/21/2012   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   Mechanical complication of other vascular device, implant, and graft 12/21/2012   Acute embolism and thrombosis of unspecified vein 12/21/2012   Cavus deformity of foot, acquired 11/23/2012   Long term (current) use of anticoagulants 05/21/2012   Mitral regurgitation 04/29/2012   Atrial fibrillation ([place]) 04/08/2012   Status epilepticus ([place]) 04/08/2012   Irregular heart beat    DVT of upper extremity (deep vein thrombosis) ([place]) 09/04/2011   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 50-70)    MITRAL VALVE PROLAPSE 06/22/2007   SEIZURE DISORDER 06/22/2007   last seizure 08-02-2019   Sleep apnea    cpap , last sleep study 10/01/11   Unspecified hypothyroidism 06/22/2007     Past medical history comments: See HPI  Surgical history: Past Surgical History:  Procedure Laterality Date   ANKLE SURGERY Left    fused does not bend, wears right leg brace   BIOPSY  08/22/2019   Procedure: BIOPSY;  Surgeon: Mauri Pole, MD;  Location: [facility];  Service: Endoscopy;;   COLONOSCOPY WITH PROPOFOL N/A 08/22/2019   Procedure: COLONOSCOPY WITH PROPOFOL;  Surgeon: Mauri Pole, MD;  Location: [facility];  Service: Endoscopy;  Laterality: N/A;   EYE SURGERY     X2 for Brown Syndrome   HEMOSTASIS CLIP PLACEMENT  08/22/2019   Procedure: HEMOSTASIS CLIP PLACEMENT;  Surgeon: Mauri Pole, MD;  Location: [facility];  Service: Endoscopy;;   IMPLANTATION VAGAL NERVE STIMULATOR     Left leaves on for surgeries   POLYPECTOMY  08/22/2019   Procedure: POLYPECTOMY;  Surgeon: Mauri Pole, MD;  Location: [facility];  Service: Endoscopy;;   PORTACATH PLACEMENT  2013 and removed and new placed   right chest     Family history: family history includes Breast cancer in his mother; Clotting disorder in his mother; Colon polyps in his father and mother; Congestive Heart Failure in his maternal grandfather; Coronary artery disease in his maternal grandfather and paternal grandfather; Diabetes in his father; Hyperlipidemia in his father; Hypertension in his father; Lung cancer in his paternal grandfather; Pneumonia in his maternal grandfather and paternal grandmother; Stroke in his paternal grandmother.   Social history: Social History   Socioeconomic History   Marital status: Single    Spouse name: Not on file   Number of children: 0   Years of education: Not on file   Highest education level: Not on file  Occupational History   Occupation: Disabled    Employer: UNEMPLOYED  Tobacco Use   Smoking status: Never    Passive exposure: Never   Smokeless tobacco: Never  Vaping Use   Vaping Use: Never used  Substance and Sexual Activity   Alcohol  use: No    Alcohol/week: 0.0 standard drinks of alcohol   Drug use: No   Sexual activity: Never  Other Topics Concern   Not on file  Social History Narrative   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    He lives with his parents.    He enjoys helping around the house, Nascar, and football.   Social Determinants of Health   Financial Resource Strain: Not on file  Food Insecurity: Not on file  Transportation Needs: Not on file  Physical Activity: Not on file  Stress: Not on file  Social Connections: Not on file  Intimate Partner Violence: Not on file    Past/failed meds: Copied from previous record: Tried Gabapentin, Tegretol, Lamictal, Felbatol, Depakote, Topamax, Keppra, [name], Phenobarbital, Vimpat, Diastat, Dilantin Allergic to Dilantin and Depakote  Allergies: Allergies  Allergen Reactions   Antihistamines, Chlorpheniramine-Type Other (See Comments)    Other Reaction: Dimetapp causes seizures   Divalproex Sodium Other (See Comments)    Nausea, vomiting, liver and kidney dysfunction   Phenytoin Rash and Other (See Comments)  Dilantin causes more seizures.    Valproic Acid And Related Other (See Comments)    Shuts down systems   Vancomycin Rash, Other (See Comments) and Swelling    If given too fast, causes red man reaction Other Reaction: Redman's Syndrome    Immunizations: Immunization History  Administered Date(s) Administered   Influenza Whole 07/11/2008, 08/06/2010   Influenza,inj,Quad PF,6+ Mos 08/04/2013, 06/14/2016   Pneumococcal Polysaccharide-23 11/05/2015   Td 12/07/2008    Diagnostics/Screenings: Copied from previous record: Nocturnal polysomnogram at [place] [facility] 10/01/2011 - abnormal  Physical Exam: BP 100/70   Pulse 86   Ht 5' 1.65" (1.566 m)   Wt 129 lb 3.2 oz (58.6 kg)   BMI 23.90 kg/m   General: well developed, well nourished man, seated in exam room, in no evident  distress Head: microcephalic and atraumatic. Oropharynx benign. No dysmorphic features. Neck: supple but large for his body size Cardiovascular: regular rate and rhythm, no murmurs. Respiratory: clear to auscultation bilaterally Abdomen: bowel sounds present all four quadrants, abdomen soft, non-tender, non-distended.  Musculoskeletal: no skeletal deformities or obvious scoliosis. Has increased tone in hands. Wears right AFO.  Skin: no rashes or neurocutaneous lesions  Neurologic Exam Mental Status: awake and fully alert. Has limited language. Tolerant of invasions into his space Cranial Nerves: fundoscopic exam - red reflex present.  Unable to fully visualize fundus.  Pupils equal briskly reactive to light.  Turns to localize faces and objects in the periphery. Turns to localize sounds in the periphery. Facial movements are symmetric. Motor: fairly normal tone but has increased tone in the hands. Wears right AFO.  Sensory: withdrawal x 4 Coordination: unable to adequately assess due to patient's inability to participate in examination. No dysmetria when reaching for objects. Gait and Station: gait is awkward and with poor balance  Impression: 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[place]) - Plan: Nocturnal polysomnography  Partial epilepsy with intractable epilepsy ([place]) - Plan: Nocturnal polysomnography  Other specified hypothyroidism  Obstructive sleep apnea (adult) (pediatric) - Plan: Nocturnal polysomnography  Migraine without aura and without status migrainosus, not intractable  Mild intellectual disability  Multifactorial gait disorder  Hypothyroidism, unspecified type  Brown's syndrome  Mitral valve disease  S/P placement of VNS (vagus nerve stimulation) device   Recommendations for plan of care: The patient's previous Epic records were reviewed. [name] has neither had nor required imaging or lab studies since the last visit. He is seen today for face  to face documentation of ongoing need for CPAP for OSA. He is using and benefiting from CPAP, and it is medically necessary for him to continue with this therapy. I am concerned about Mom's reports of [name] going to sleep during the day and will order a repeat polysomnogram since it has not been repeated since the therapy was initiated in 2013. I will also contact his DME agency to check his current equipment as Mom reports that it is loud and noisy, and was not so in the past. I instructed [name] to continue follow up for his epilepsy with Dr Coralie Keens and Dr Marcia Brash at [facility]. I will see him as needed in the future. Mom agreed with the plans made today.   The medication list was reviewed and reconciled. No changes were made in the prescribed medications today. A complete medication list was provided to the patient.  Orders Placed This Encounter  Procedures   Nocturnal polysomnography    Standing Status:   Future    Standing Expiration Date:  04/26/2023    Scheduling Instructions:     Patient with known severe OSA, on CPAP since 2013. He has gained weight and has not had any updates to CPAP settings since initiation of CPAP in 2013. He also has history of intractable epilepsy, takes multiple antiepileptic medications, has VNS, hypothyroidism, cardiac disease and intellectual delay    Order Specific Question:   Where should this test be performed:    Answer:   [facility] as of 04/25/2022       Reactions   Antihistamines, Chlorpheniramine-type Other (See Comments)   Other Reaction: Dimetapp causes seizures   Divalproex Sodium Other (See Comments)   Nausea, vomiting, liver and kidney dysfunction   Phenytoin Rash, Other (See Comments)   Dilantin causes more seizures.   Valproic Acid And Related Other (See Comments)   Shuts down systems   Vancomycin Rash, Other (See Comments), Swelling   If given too fast, causes red man reaction Other Reaction: Redman's Syndrome         Medication List        Accurate as of April 25, 2022 12:12 PM. If you have any questions, ask your nurse or doctor.          acetaminophen 500 MG tablet Commonly known as: TYLENOL Take 1,000 mg by mouth every 6 (six) hours as needed for headache.   albuterol 1.25 MG/3ML nebulizer solution Commonly known as: ACCUNEB Take 1 ampule by nebulization every 4 (four) hours as needed for wheezing.   carbamazepine 100 MG chewable tablet Commonly known as: TEGRETOL CHEW 2 TABLETS IN THE MORNING, 2 TABLETS AT MIDDAY AND 1 & 1/2 TABLETS IN THE EVENING What changed: additional instructions   cetirizine 10 MG tablet Commonly known as: ZYRTEC Take 10 mg by mouth daily.   Dextromethorphan-guaiFENesin 5-100 MG/5ML Liqd Take 10 mLs by mouth 2 (two) times daily as needed (cough).   diazepam 2 MG tablet Commonly known as: Valium Take 1 tablet every 6 - 8 hours as needed for anxiety or seizures   Fish Oil 500 MG Caps Take by mouth.   Flaxseed Oil 1000 MG Caps Take 1,000 mg by mouth daily.   guaiFENesin 600 MG 12 hr tablet Commonly known as: MUCINEX Take 600 mg by mouth 2 (two) times daily.   K-Phos 500 MG tablet Generic drug: potassium phosphate (monobasic) Take 1 tablet (500 mg total) by mouth 4 (four) times daily.   Klor-Con M20 20 MEQ tablet Generic drug: potassium chloride SA TAKE 1 TABLET BY MOUTH EVERY DAY   levETIRAcetam 500 MG tablet Commonly known as: KEPPRA Take 500 mg by mouth at bedtime. Takes 2 tablets in the morning and 2.5 tablets in the evening   levothyroxine 50 MCG tablet Commonly known as: SYNTHROID Take 50 mcg by mouth daily before breakfast.   LORazepam 2 MG/ML injection Commonly known as: ATIVAN Inject 1 mL (2 mg total) into the vein See admin instructions. Dilute '2mg'$  of lorazepam with 67m of saline.  Administer IV push over 3-5 minutes as needed for seizures.   montelukast 10 MG tablet Commonly known as: SINGULAIR Take 10 mg by mouth at  bedtime.   multivitamin with minerals Tabs tablet Take 1 tablet by mouth daily.   Omega-3 500 MG Caps Take 500 mg by mouth daily.   ondansetron 4 MG disintegrating tablet Commonly known as: ZOFRAN-ODT Take 1 tablet as needed for nausea and vomiting   [name] 10 MG tablet Generic drug: cloBAZam  TAKE 1 TABLET BY MOUTH IN THE MORNING, 1 IN THE AFTERNOON AND 2 AT BEDTIME   PHENObarbital 65 MG/ML injection Commonly known as: LUMINAL Withdraw 4m ('65mg'$ ) Phenobarbital and 466mNormal Saline into 20m1myringe. [place] IV push for 3-5 minutes as needed for recurrent seizures   promethazine 25 MG suppository Commonly known as: PHENERGAN Place 25 mg rectally every 6 (six) hours as needed for nausea or vomiting.   rosuvastatin 10 MG tablet Commonly known as: CRESTOR Take 10 mg by mouth at bedtime.   Trokendi XR 50 MG Cp24 Generic drug: Topiramate ER Take 1 capsule at nighttime   Trokendi XR 200 MG Cp24 Generic drug: Topiramate ER Take 1 capsule by mouth at bedtime.   Vimpat 100 MG Tabs Generic drug: Lacosamide TAKE 1 TABLET IN THE MORNING AND TAKE 2 TABLETS AT NIGHT      Total time spent with the patient was 25 minutes, of which 50% or more was spent in counseling and coordination of care.  TinRockwell Germany-C [facility] Ph. [phone]

## 2022-04-27 ENCOUNTER — Encounter (INDEPENDENT_AMBULATORY_CARE_PROVIDER_SITE_OTHER): Payer: Self-pay | Admitting: Family

## 2022-04-27 DIAGNOSIS — Z9689 Presence of other specified functional implants: Secondary | ICD-10-CM | POA: Insufficient documentation

## 2022-06-01 ENCOUNTER — Encounter (HOSPITAL_COMMUNITY): Payer: Self-pay

## 2022-06-01 ENCOUNTER — Encounter (HOSPITAL_BASED_OUTPATIENT_CLINIC_OR_DEPARTMENT_OTHER): Payer: Self-pay

## 2022-06-01 ENCOUNTER — Emergency Department (HOSPITAL_BASED_OUTPATIENT_CLINIC_OR_DEPARTMENT_OTHER): Payer: Medicaid Other | Admitting: Radiology

## 2022-06-01 ENCOUNTER — Emergency Department (HOSPITAL_BASED_OUTPATIENT_CLINIC_OR_DEPARTMENT_OTHER): Payer: Medicaid Other

## 2022-06-01 ENCOUNTER — Inpatient Hospital Stay (HOSPITAL_BASED_OUTPATIENT_CLINIC_OR_DEPARTMENT_OTHER)
Admission: EM | Admit: 2022-06-01 | Discharge: 2022-06-05 | DRG: 194 | Disposition: A | Discharge status: Home Health | Payer: Medicaid Other | Attending: Family Medicine | Admitting: Family Medicine

## 2022-06-01 ENCOUNTER — Other Ambulatory Visit: Payer: Self-pay

## 2022-06-01 DIAGNOSIS — F70 Mild intellectual disabilities: Secondary | ICD-10-CM | POA: Diagnosis present

## 2022-06-01 DIAGNOSIS — G40109 Localization-related (focal) (partial) symptomatic epilepsy and epileptic syndromes with simple partial seizures, not intractable, without status epilepticus: Secondary | ICD-10-CM | POA: Diagnosis present

## 2022-06-01 DIAGNOSIS — J309 Allergic rhinitis, unspecified: Secondary | ICD-10-CM | POA: Diagnosis present

## 2022-06-01 DIAGNOSIS — R062 Wheezing: Secondary | ICD-10-CM

## 2022-06-01 DIAGNOSIS — Z881 Allergy status to other antibiotic agents status: Secondary | ICD-10-CM

## 2022-06-01 DIAGNOSIS — G43701 Chronic migraine without aura, not intractable, with status migrainosus: Secondary | ICD-10-CM | POA: Diagnosis present

## 2022-06-01 DIAGNOSIS — Z86718 Personal history of other venous thrombosis and embolism: Secondary | ICD-10-CM | POA: Diagnosis not present

## 2022-06-01 DIAGNOSIS — Z8249 Family history of ischemic heart disease and other diseases of the circulatory system: Secondary | ICD-10-CM

## 2022-06-01 DIAGNOSIS — G4730 Sleep apnea, unspecified: Secondary | ICD-10-CM | POA: Diagnosis present

## 2022-06-01 DIAGNOSIS — G40119 Localization-related (focal) (partial) symptomatic epilepsy and epileptic syndromes with simple partial seizures, intractable, without status epilepticus: Secondary | ICD-10-CM | POA: Diagnosis present

## 2022-06-01 DIAGNOSIS — Z79890 Hormone replacement therapy (postmenopausal): Secondary | ICD-10-CM

## 2022-06-01 DIAGNOSIS — R1032 Left lower quadrant pain: Principal | ICD-10-CM

## 2022-06-01 DIAGNOSIS — I959 Hypotension, unspecified: Secondary | ICD-10-CM | POA: Diagnosis present

## 2022-06-01 DIAGNOSIS — E876 Hypokalemia: Secondary | ICD-10-CM | POA: Diagnosis present

## 2022-06-01 DIAGNOSIS — J189 Pneumonia, unspecified organism: Principal | ICD-10-CM | POA: Diagnosis present

## 2022-06-01 DIAGNOSIS — Z8371 Family history of colonic polyps: Secondary | ICD-10-CM

## 2022-06-01 DIAGNOSIS — Z79899 Other long term (current) drug therapy: Secondary | ICD-10-CM | POA: Diagnosis not present

## 2022-06-01 DIAGNOSIS — Z888 Allergy status to other drugs, medicaments and biological substances status: Secondary | ICD-10-CM

## 2022-06-01 DIAGNOSIS — E785 Hyperlipidemia, unspecified: Secondary | ICD-10-CM | POA: Diagnosis present

## 2022-06-01 DIAGNOSIS — Z8719 Personal history of other diseases of the digestive system: Secondary | ICD-10-CM | POA: Diagnosis not present

## 2022-06-01 DIAGNOSIS — I480 Paroxysmal atrial fibrillation: Secondary | ICD-10-CM | POA: Diagnosis present

## 2022-06-01 DIAGNOSIS — Z20822 Contact with and (suspected) exposure to covid-19: Secondary | ICD-10-CM | POA: Diagnosis present

## 2022-06-01 DIAGNOSIS — I340 Nonrheumatic mitral (valve) insufficiency: Secondary | ICD-10-CM | POA: Diagnosis present

## 2022-06-01 DIAGNOSIS — E039 Hypothyroidism, unspecified: Secondary | ICD-10-CM | POA: Diagnosis present

## 2022-06-01 DIAGNOSIS — R079 Chest pain, unspecified: Secondary | ICD-10-CM

## 2022-06-01 DIAGNOSIS — G40319 Generalized idiopathic epilepsy and epileptic syndromes, intractable, without status epilepticus: Secondary | ICD-10-CM | POA: Diagnosis not present

## 2022-06-01 LAB — URINALYSIS, ROUTINE W REFLEX MICROSCOPIC
Bilirubin Urine: NEGATIVE
Glucose, UA: NEGATIVE mg/dL
Hgb urine dipstick: NEGATIVE
Ketones, ur: NEGATIVE mg/dL
Leukocytes,Ua: NEGATIVE
Nitrite: NEGATIVE
Protein, ur: NEGATIVE mg/dL
Specific Gravity, Urine: 1.005 (ref 1.005–1.030)
pH: 6 (ref 5.0–8.0)

## 2022-06-01 LAB — CBC WITH DIFFERENTIAL/PLATELET
Abs Immature Granulocytes: 0.03 10*3/uL (ref 0.00–0.07)
Basophils Absolute: 0 10*3/uL (ref 0.0–0.1)
Basophils Relative: 0 %
Eosinophils Absolute: 0 10*3/uL (ref 0.0–0.5)
Eosinophils Relative: 0 %
HCT: 36.2 % — ABNORMAL LOW (ref 39.0–52.0)
Hemoglobin: 12 g/dL — ABNORMAL LOW (ref 13.0–17.0)
Immature Granulocytes: 0 %
Lymphocytes Relative: 11 %
Lymphs Abs: 1 10*3/uL (ref 0.7–4.0)
MCH: 28.4 pg (ref 26.0–34.0)
MCHC: 33.1 g/dL (ref 30.0–36.0)
MCV: 85.8 fL (ref 80.0–100.0)
Monocytes Absolute: 0.8 10*3/uL (ref 0.1–1.0)
Monocytes Relative: 9 %
Neutro Abs: 8 10*3/uL — ABNORMAL HIGH (ref 1.7–7.7)
Neutrophils Relative %: 80 %
Platelets: 375 10*3/uL (ref 150–400)
RBC: 4.22 MIL/uL (ref 4.22–5.81)
RDW: 12.8 % (ref 11.5–15.5)
WBC: 9.9 10*3/uL (ref 4.0–10.5)
nRBC: 0 % (ref 0.0–0.2)

## 2022-06-01 LAB — TROPONIN I (HIGH SENSITIVITY): Troponin I (High Sensitivity): 3 ng/L (ref <18)

## 2022-06-01 LAB — COMPREHENSIVE METABOLIC PANEL
ALT: 39 U/L (ref 0–44)
AST: 20 U/L (ref 15–41)
Albumin: 3.9 g/dL (ref 3.5–5.0)
Alkaline Phosphatase: 108 U/L (ref 38–126)
Anion gap: 11 (ref 5–15)
BUN: 10 mg/dL (ref 6–20)
CO2: 22 mmol/L (ref 22–32)
Calcium: 9.2 mg/dL (ref 8.9–10.3)
Chloride: 103 mmol/L (ref 98–111)
Creatinine, Ser: 0.92 mg/dL (ref 0.61–1.24)
GFR, Estimated: >60 mL/min (ref >60)
Glucose, Bld: 136 mg/dL — ABNORMAL HIGH (ref 70–99)
Potassium: 3.7 mmol/L (ref 3.5–5.1)
Sodium: 136 mmol/L (ref 135–145)
Total Bilirubin: 0.5 mg/dL (ref 0.3–1.2)
Total Protein: 7.8 g/dL (ref 6.5–8.1)

## 2022-06-01 LAB — APTT: aPTT: 32 seconds (ref 24–36)

## 2022-06-01 LAB — PROTIME-INR
INR: 1.2 (ref 0.8–1.2)
Prothrombin Time: 15.3 seconds — ABNORMAL HIGH (ref 11.4–15.2)

## 2022-06-01 LAB — SARS CORONAVIRUS 2 BY RT PCR: SARS Coronavirus 2 by RT PCR: NEGATIVE

## 2022-06-01 LAB — LACTIC ACID, PLASMA: Lactic Acid, Venous: 0.4 mmol/L — ABNORMAL LOW (ref 0.5–1.9)

## 2022-06-01 MED ORDER — PROMETHAZINE HCL 25 MG RE SUPP: 25.0000 mg | Freq: Four times a day (QID) | RECTAL | Status: DC | PRN

## 2022-06-01 MED ORDER — LACTATED RINGERS IV BOLUS (SEPSIS)
1000.0000 mL | Freq: Once | INTRAVENOUS | Status: AC
  Administered 2022-06-01: 1000 mL via INTRAVENOUS

## 2022-06-01 MED ORDER — LEVETIRACETAM 500 MG PO TABS: 500.0000 mg | ORAL_TABLET | Freq: Every day | ORAL | Status: DC

## 2022-06-01 MED ORDER — CHLORHEXIDINE GLUCONATE CLOTH 2 % EX PADS
6.0000 | MEDICATED_PAD | Freq: Every day | CUTANEOUS | Status: DC
  Administered 2022-06-01 – 2022-06-04 (×2): 6 via TOPICAL

## 2022-06-01 MED ORDER — CARBAMAZEPINE 100 MG PO CHEW: 200.0000 mg | CHEWABLE_TABLET | Freq: Three times a day (TID) | ORAL | Status: DC

## 2022-06-01 MED ORDER — IOHEXOL 350 MG/ML SOLN
100.0000 mL | Freq: Once | INTRAVENOUS | Status: AC | PRN
  Administered 2022-06-01: 85 mL via INTRAVENOUS

## 2022-06-01 MED ORDER — LEVETIRACETAM 500 MG PO TABS
1250.0000 mg | ORAL_TABLET | Freq: Every day | ORAL | Status: DC
  Administered 2022-06-01 – 2022-06-04 (×4): 1250 mg via ORAL
  Filled 2022-06-01 (×4): qty 1

## 2022-06-01 MED ORDER — CARBAMAZEPINE 100 MG PO CHEW
150.0000 mg | CHEWABLE_TABLET | Freq: Three times a day (TID) | ORAL | Status: DC
  Administered 2022-06-01 – 2022-06-05 (×12): 150 mg via ORAL
  Filled 2022-06-01 (×13): qty 1.5

## 2022-06-01 MED ORDER — ACETAMINOPHEN 325 MG PO TABS
650.0000 mg | ORAL_TABLET | Freq: Four times a day (QID) | ORAL | Status: DC | PRN
Start: 2022-06-01 — End: 2022-06-02
  Administered 2022-06-01 – 2022-06-02 (×2): 650 mg via ORAL
  Filled 2022-06-01 (×2): qty 2

## 2022-06-01 MED ORDER — SODIUM CHLORIDE 0.9 % IV SOLN
INTRAVENOUS | Status: DC | PRN
  Administered 2022-06-01: 10 mL/h via INTRAVENOUS

## 2022-06-01 MED ORDER — LORAZEPAM 2 MG/ML IJ SOLN: 2.0000 mg | Freq: Four times a day (QID) | INTRAMUSCULAR | Status: DC | PRN

## 2022-06-01 MED ORDER — ACETAMINOPHEN 500 MG PO TABS
1000.0000 mg | ORAL_TABLET | Freq: Once | ORAL | Status: AC
  Administered 2022-06-01: 1000 mg via ORAL
  Filled 2022-06-01: qty 2

## 2022-06-01 MED ORDER — LEVETIRACETAM 500 MG PO TABS
1000.0000 mg | ORAL_TABLET | Freq: Every day | ORAL | Status: DC
  Administered 2022-06-02 – 2022-06-05 (×4): 1000 mg via ORAL
  Filled 2022-06-01 (×4): qty 2

## 2022-06-01 MED ORDER — SODIUM CHLORIDE 0.9 % IV BOLUS
1000.0000 mL | Freq: Once | INTRAVENOUS | Status: AC
  Administered 2022-06-01: 1000 mL via INTRAVENOUS

## 2022-06-01 MED ORDER — SODIUM CHLORIDE 0.9 % IV BOLUS: 500.0000 mL | Freq: Once | INTRAVENOUS | Status: DC

## 2022-06-01 MED ORDER — ROSUVASTATIN CALCIUM 10 MG PO TABS
10.0000 mg | ORAL_TABLET | Freq: Every day | ORAL | Status: DC
  Administered 2022-06-01 – 2022-06-04 (×4): 10 mg via ORAL
  Filled 2022-06-01 (×4): qty 1

## 2022-06-01 MED ORDER — POTASSIUM CHLORIDE CRYS ER 20 MEQ PO TBCR
20.0000 meq | EXTENDED_RELEASE_TABLET | Freq: Every day | ORAL | Status: DC
  Administered 2022-06-01: 20 meq via ORAL
  Filled 2022-06-01: qty 1

## 2022-06-01 MED ORDER — ALBUTEROL SULFATE (2.5 MG/3ML) 0.083% IN NEBU
2.5000 mg | INHALATION_SOLUTION | RESPIRATORY_TRACT | Status: DC | PRN
Start: 2022-06-01 — End: 2022-06-05
  Administered 2022-06-02: 2.5 mg via RESPIRATORY_TRACT
  Filled 2022-06-01: qty 3

## 2022-06-01 MED ORDER — SODIUM CHLORIDE 0.9 % IV SOLN
1.0000 g | Freq: Once | INTRAVENOUS | Status: AC
  Administered 2022-06-01: 1 g via INTRAVENOUS
  Filled 2022-06-01: qty 10

## 2022-06-01 MED ORDER — SODIUM CHLORIDE 0.9 % IV SOLN
2.0000 g | INTRAVENOUS | Status: DC
  Administered 2022-06-02 – 2022-06-05 (×4): 2 g via INTRAVENOUS
  Filled 2022-06-01 (×4): qty 20

## 2022-06-01 MED ORDER — PHENOBARBITAL SODIUM 65 MG/ML IJ SOLN: 65.0000 mg | INTRAMUSCULAR | Status: DC | PRN

## 2022-06-01 MED ORDER — LACOSAMIDE 50 MG PO TABS
200.0000 mg | ORAL_TABLET | Freq: Every day | ORAL | Status: DC
  Administered 2022-06-01 – 2022-06-04 (×4): 200 mg via ORAL
  Filled 2022-06-01 (×4): qty 4

## 2022-06-01 MED ORDER — ALBUTEROL SULFATE (2.5 MG/3ML) 0.083% IN NEBU: 2.5000 mg | INHALATION_SOLUTION | RESPIRATORY_TRACT | Status: DC | PRN

## 2022-06-01 MED ORDER — ALTEPLASE 2 MG IJ SOLR
2.0000 mg | Freq: Once | INTRAMUSCULAR | Status: AC
  Administered 2022-06-01: 2 mg
  Filled 2022-06-01: qty 2

## 2022-06-01 MED ORDER — LEVOTHYROXINE SODIUM 50 MCG PO TABS
50.0000 ug | ORAL_TABLET | Freq: Every day | ORAL | Status: DC
  Administered 2022-06-02 – 2022-06-05 (×4): 50 ug via ORAL
  Filled 2022-06-01 (×4): qty 1

## 2022-06-01 MED ORDER — LACOSAMIDE 50 MG PO TABS
100.0000 mg | ORAL_TABLET | Freq: Every day | ORAL | Status: DC
  Administered 2022-06-02 – 2022-06-05 (×4): 100 mg via ORAL
  Filled 2022-06-01 (×4): qty 2

## 2022-06-01 MED ORDER — LACOSAMIDE 100 MG PO TABS: 200.0000 mg | ORAL_TABLET | Freq: Every day | ORAL | Status: DC

## 2022-06-01 MED ORDER — ENOXAPARIN SODIUM 40 MG/0.4ML IJ SOSY
40.0000 mg | PREFILLED_SYRINGE | INTRAMUSCULAR | Status: DC
  Administered 2022-06-01 – 2022-06-04 (×4): 40 mg via SUBCUTANEOUS
  Filled 2022-06-01 (×4): qty 0.4

## 2022-06-01 MED ORDER — SODIUM CHLORIDE 0.9 % IV SOLN
500.0000 mg | Freq: Once | INTRAVENOUS | Status: AC
  Administered 2022-06-01: 500 mg via INTRAVENOUS
  Filled 2022-06-01: qty 5

## 2022-06-01 MED ORDER — TOPIRAMATE ER 25 MG PO SPRINKLE CAP24
50.0000 mg | EXTENDED_RELEASE_CAPSULE | Freq: Every day | ORAL | Status: DC
  Administered 2022-06-01 – 2022-06-04 (×4): 50 mg via ORAL
  Filled 2022-06-01 (×5): qty 2

## 2022-06-01 MED ORDER — ONDANSETRON HCL 4 MG/2ML IJ SOLN
4.0000 mg | Freq: Four times a day (QID) | INTRAMUSCULAR | Status: DC | PRN
  Administered 2022-06-01 – 2022-06-03 (×5): 4 mg via INTRAVENOUS
  Filled 2022-06-01 (×5): qty 2

## 2022-06-01 MED ORDER — SODIUM CHLORIDE 0.9% FLUSH
10.0000 mL | INTRAVENOUS | Status: DC | PRN
  Administered 2022-06-01 (×2): 20 mL
  Administered 2022-06-03: 10 mL

## 2022-06-01 MED ORDER — TOPIRAMATE ER 100 MG PO SPRINKLE CAP24
200.0000 mg | EXTENDED_RELEASE_CAPSULE | Freq: Every day | ORAL | Status: DC
  Administered 2022-06-01 – 2022-06-04 (×4): 200 mg via ORAL
  Filled 2022-06-01 (×5): qty 2

## 2022-06-01 MED ORDER — CLOBAZAM 10 MG PO TABS
20.0000 mg | ORAL_TABLET | Freq: Every day | ORAL | Status: DC
  Administered 2022-06-01 – 2022-06-04 (×4): 20 mg via ORAL
  Filled 2022-06-01 (×4): qty 2

## 2022-06-01 MED ORDER — LACTATED RINGERS IV SOLN: INTRAVENOUS | Status: DC

## 2022-06-01 MED ORDER — CLOBAZAM 10 MG PO TABS
10.0000 mg | ORAL_TABLET | Freq: Two times a day (BID) | ORAL | Status: DC
Start: 2022-06-01 — End: 2022-06-05
  Administered 2022-06-01 – 2022-06-05 (×8): 10 mg via ORAL
  Filled 2022-06-01 (×8): qty 1

## 2022-06-01 MED ORDER — ACETAMINOPHEN 650 MG RE SUPP: 650.0000 mg | Freq: Four times a day (QID) | RECTAL | Status: DC | PRN

## 2022-06-01 MED ORDER — MONTELUKAST SODIUM 10 MG PO TABS
10.0000 mg | ORAL_TABLET | Freq: Every day | ORAL | Status: DC
  Administered 2022-06-01 – 2022-06-04 (×4): 10 mg via ORAL
  Filled 2022-06-01 (×4): qty 1

## 2022-06-01 MED ORDER — ALBUTEROL SULFATE HFA 108 (90 BASE) MCG/ACT IN AERS: 2.0000 | INHALATION_SPRAY | RESPIRATORY_TRACT | Status: DC | PRN

## 2022-06-01 MED ORDER — ALBUTEROL SULFATE (2.5 MG/3ML) 0.083% IN NEBU
INHALATION_SOLUTION | RESPIRATORY_TRACT | Status: AC
  Administered 2022-06-01: 2.5 mg
  Filled 2022-06-01: qty 3

## 2022-06-01 MED ORDER — TOPIRAMATE ER 25 MG PO SPRINKLE CAP24
250.0000 mg | EXTENDED_RELEASE_CAPSULE | Freq: Every day | ORAL | Status: DC
  Filled 2022-06-01: qty 2

## 2022-06-01 MED ORDER — TOPIRAMATE ER 25 MG PO SPRINKLE CAP24: 250.0000 mg | EXTENDED_RELEASE_CAPSULE | Freq: Every day | ORAL | Status: DC

## 2022-06-01 MED ORDER — GUAIFENESIN ER 600 MG PO TB12
600.0000 mg | ORAL_TABLET | Freq: Two times a day (BID) | ORAL | Status: DC
Start: 2022-06-01 — End: 2022-06-05
  Administered 2022-06-01 – 2022-06-05 (×8): 600 mg via ORAL
  Filled 2022-06-01 (×8): qty 1

## 2022-06-01 MED ORDER — SODIUM CHLORIDE 0.9 % IV SOLN
500.0000 mg | INTRAVENOUS | Status: DC
  Administered 2022-06-02 – 2022-06-05 (×4): 500 mg via INTRAVENOUS
  Filled 2022-06-01 (×4): qty 5

## 2022-06-01 MED ORDER — ONDANSETRON HCL 4 MG PO TABS: 4.0000 mg | ORAL_TABLET | Freq: Four times a day (QID) | ORAL | Status: DC | PRN

## 2022-06-01 NOTE — Progress Notes (Signed)
Plan of Care Note for accepted transfer   Patient: [name] MRN: [id number]   DOA: 06/01/2022  Facility requesting transfer: [facility]. Requesting Provider: Elnora Morrison MD Reason for transfer: RUL pneumonia. Facility course:  Per Dr. Reather Converse: " Chief Complaint  Patient presents with   Chest Pain   Wheezing    [name] is a 36 y.o. male.   Patient presents with decreased appetite, lower chest discomfort and left lower abdominal pain.  Mild pleuritic component.  Patient had history of blood clots associated with falling on his right port.  History of seizures, not currently on anticoagulants, no known heart problems, mental retardation history with Brown syndrome.  Patient had viral-like symptoms with mild cough, decreased appetite for a few days that improved and then today he was feeling worse and new symptoms with chest discomfort lower and abdominal discomfort.  No known kidney stone history.  This is newer for patient.  Patient has had pneumonia in the past however cough is minimal.  Decreased appetite and oral fluid intake."   He received IV fluids, azithromycin, ceftriaxone and 1000 mL of normal saline bolus.  However, the patient has recently become mildly hypotensive.  Lactic acid and LR bolus ordered.  Accepted to PCU.  Lab work: Specimen Type: Blood   SARS Coronavirus 2 by RT PCR (hospital order, performed in [facility] hospital lab) *cepheid single result test* Anterior Nasal Swab [[id number]]   Collected: 06/01/22 1051   Updated: 06/01/22 1143   Specimen Source: Anterior Nasal Swab    SARS Coronavirus 2 by RT PCR NEGATIVE  Troponin I (High Sensitivity) [[id number]]   Collected: 06/01/22 1051   Updated: 06/01/22 1140   Specimen Source: Vein    Troponin I (High Sensitivity) 3 ng/L  Comprehensive metabolic panel [[id number]] (Abnormal)   Collected: 06/01/22 1051   Updated: 06/01/22 1135   Specimen Type: Blood   Specimen Source: Vein    Sodium 136  mmol/L   Potassium 3.7 mmol/L   Chloride 103 mmol/L   CO2 22 mmol/L   Glucose, Bld 136 High  mg/dL   BUN 10 mg/dL   Creatinine, Ser 0.92 mg/dL   Calcium 9.2 mg/dL   Total Protein 7.8 g/dL   Albumin 3.9 g/dL   AST 20 U/L   ALT 39 U/L   Alkaline Phosphatase 108 U/L   Total Bilirubin 0.5 mg/dL   GFR, Estimated >60 mL/min   Anion gap 11  CBC with Differential [[id number]] (Abnormal)   Collected: 06/01/22 1051   Updated: 06/01/22 1109   Specimen Type: Blood   Specimen Source: Vein    WBC 9.9 K/uL   RBC 4.22 MIL/uL   Hemoglobin 12.0 Low  g/dL   HCT 36.2 Low  %   MCV 85.8 fL   MCH 28.4 pg   MCHC 33.1 g/dL   RDW 12.8 %   Platelets 375 K/uL   nRBC 0.0 %   Neutrophils Relative % 80 %   Neutro Abs 8.0 High  K/uL   Lymphocytes Relative 11 %   Lymphs Abs 1.0 K/uL   Monocytes Relative 9 %   Monocytes Absolute 0.8 K/uL   Eosinophils Relative 0 %   Eosinophils Absolute 0.0 K/uL   Basophils Relative 0 %   Basophils Absolute 0.0 K/uL   Immature Granulocytes 0 %   Abs Immature Granulocytes 0.03 K/uL   Imaging: CTA chest and CT abdomen/pelvis   IMPRESSION: 1. No evidence for acute pulmonary embolism. 2. Airspace opacity within the  right upper lobe concerning for pneumonia. 3. Small right pleural effusion. 4. No evidence for acute intra-abdominal or pelvic abnormality. 5. Extensive chest wall collaterals compatible with known chronic bilateral central venous occlusion. 6. Aortic Atherosclerosis (ICD10-I70.0).   Electronically Signed   By: Kerby Moors M.D.   On: 06/01/2022 12:41  Plan of care: The patient is accepted for admission to Progressive unit, at [facility].   Author: Reubin Milan, MD 06/01/2022  Check [email or web address] for on-call coverage.  Nursing staff, Please call [place] number on Amion as soon as patient's arrival, so appropriate admitting provider can evaluate the pt.

## 2022-06-01 NOTE — H&P (Signed)
History and Physical    Patient: [name] IOE:[id number] DOB: 1986/05/05 DOA: 06/01/2022 DOS: the patient was seen and examined on 06/01/2022 PCP: Chesley Noon, MD  Patient coming from: Home  Chief Complaint:  Chief Complaint  Patient presents with   Chest Pain   Wheezing   HPI: [name] is a 36 y.o. male with medical history significant of allergic rhinitis, Brown syndrome, growth disorder, migraine headaches, moderate mitral regurgitation/MVP, mild mental retardation, hyperlipidemia, hypokalemia, seizure disorder, sleep apnea on CPAP, hypothyroidism, PAF not on AC is coming to the emergency department with pleuritic chest pain, wheezing, dyspnea, productive cough of whitish sputum, fatigue, malaise, decreased appetite, low-grade fever and chills that initially started with a migraine headache in the setting of URI symptoms that started on 05/22/2022 and have slowly progressed throughout both symptoms.  History is  taken from the parents as the patient only answers simple questions.  He denied any headache, hydrologist, myalgias, pleuritic or chest pain otherwise, back or abdominal pain at the moment.  ED course: Initial vital signs were temperature 99.3 F, pulse 68, respirations 24, BP 103/75 mmHg and O2 sat 80% on room air.  He received acetaminophen 1000 mg p.o. x1, 1000 mL of normal saline, 1000 mL of LR, ceftriaxone 1 g IVPB and azithromycin 500 mg IVPB.  Lab work: His urinalysis was normal.  Coronavirus PCR was negative.  CBC showed a white count 9.9 with 80% neutrophils, hemoglobin 12.0 g/dL platelets 375.  PT 15.3, INR 1.3 and PTT 32.  CMP with a glucose of 136 mg/deciliter, the rest of the CMP measurements were normal.  Unremarkable troponin and lactic acid.  Imaging: No PE.  There was RUL consolidation.  There was small right pleural effusion.  Aortic atherosclerosis.  No acute intra-abdominal or pelvic abnormality.  Extensive chest wall collaterals  compatible with known chronic bilateral central venous occlusion.   Review of Systems: As mentioned in the history of present illness. All other systems reviewed and are negative.  Past Medical History:  Diagnosis Date   ALLERGIC RHINITIS 12/26/2009   Blood clot in vein 2015   Brown's syndrome    Complication of anesthesia    seizure after pac surgery at [place] 2013 had to be intubated, no other anes issurs   Growth disorder    Headache    migraines   Heart murmur    moderate MR ses dr Darcus Pester   Hyperlipidemia    No therapy   Hypokalemia    Mental retardation, mild (I.Q. 50-70)    MITRAL VALVE PROLAPSE 06/22/2007   SEIZURE DISORDER 06/22/2007   last seizure 08-02-2019   Sleep apnea    cpap , last sleep study 10/01/11   Unspecified hypothyroidism 06/22/2007   Past Surgical History:  Procedure Laterality Date   ANKLE SURGERY Left    fused does not bend, wears right leg brace   BIOPSY  08/22/2019   Procedure: BIOPSY;  Surgeon: Mauri Pole, MD;  Location: [facility];  Service: Endoscopy;;   COLONOSCOPY WITH PROPOFOL N/A 08/22/2019   Procedure: COLONOSCOPY WITH PROPOFOL;  Surgeon: Mauri Pole, MD;  Location: [facility];  Service: Endoscopy;  Laterality: N/A;   EYE SURGERY     X2 for Brown Syndrome   HEMOSTASIS CLIP PLACEMENT  08/22/2019   Procedure: HEMOSTASIS CLIP PLACEMENT;  Surgeon: Mauri Pole, MD;  Location: [facility];  Service: Endoscopy;;   IMPLANTATION VAGAL NERVE STIMULATOR     Left leaves on for surgeries  POLYPECTOMY  08/22/2019   Procedure: POLYPECTOMY;  Surgeon: Mauri Pole, MD;  Location: [facility];  Service: Endoscopy;;   PORTACATH PLACEMENT  2013 and removed and new placed   right chest   Social History:  reports that he has never smoked. He has never been exposed to tobacco smoke. He has never used smokeless tobacco. He reports that he does not drink alcohol and does not use drugs.  Allergies  Allergen Reactions    Antihistamines, Chlorpheniramine-Type Other (See Comments)    Other Reaction: Dimetapp causes seizures   Divalproex Sodium Other (See Comments)    Nausea, vomiting, liver and kidney dysfunction   Phenytoin Rash and Other (See Comments)    Dilantin causes more seizures.    Valproic Acid And Related Other (See Comments)    Shuts down systems   Vancomycin Rash, Other (See Comments) and Swelling    If given too fast, causes red man reaction Other Reaction: Redman's Syndrome    Family History  Problem Relation Age of Onset   Breast cancer Mother    Colon polyps Mother    Clotting disorder Mother    Diabetes Father    Hypertension Father    Colon polyps Father    Hyperlipidemia Father    Coronary artery disease Maternal Grandfather    Congestive Heart Failure Maternal Grandfather        Died at 70   Pneumonia Maternal Grandfather    Coronary artery disease Paternal Grandfather    Lung cancer Paternal Grandfather        Died at 55   Pneumonia Paternal Grandmother        Died at 75   Stroke Paternal Grandmother     Prior to Admission medications   Medication Sig Start Date End Date Taking? Authorizing Provider  acetaminophen (TYLENOL) 500 MG tablet Take 1,000 mg by mouth every 6 (six) hours as needed for headache. Patient not taking: Reported on 04/01/2022    [provider]  albuterol (ACCUNEB) 1.25 MG/3ML nebulizer solution Take 1 ampule by nebulization every 4 (four) hours as needed for wheezing. Patient not taking: Reported on 04/25/2022    [provider]  carbamazepine (TEGRETOL) 100 MG chewable tablet CHEW 2 TABLETS IN THE MORNING, 2 TABLETS AT MIDDAY AND 1 & 1/2 TABLETS IN THE EVENING Patient taking differently: CHEW 1 1/2TABLETS IN THE MORNING, 1 1/2 TABLETS AT MIDDAY AND 1 & 1/2 TABLETS IN THE EVENING 03/06/21   Jodi Geralds, MD  cetirizine (ZYRTEC) 10 MG tablet Take 10 mg by mouth daily. Patient not taking: Reported on 04/25/2022 02/12/15   [provider]  Dextromethorphan-guaiFENesin 5-100 MG/5ML LIQD Take 10 mLs by mouth 2 (two) times daily as needed (cough). Patient not taking: Reported on 04/01/2022    [provider]  diazepam (VALIUM) 2 MG tablet Take 1 tablet every 6 - 8 hours as needed for anxiety or seizures Patient not taking: Reported on 04/01/2022 05/01/20   Jodi Geralds, MD  Flaxseed, Linseed, (FLAXSEED OIL) 1000 MG CAPS Take 1,000 mg by mouth daily. Patient not taking: Reported on 04/25/2022    [provider]  guaiFENesin (MUCINEX) 600 MG 12 hr tablet Take 600 mg by mouth 2 (two) times daily. Patient not taking: Reported on 04/01/2022    [provider]  Javier Docker Oil (OMEGA-3) 500 MG CAPS Take 500 mg by mouth daily. Patient not taking: Reported on 04/01/2022    [provider]  levETIRAcetam (KEPPRA) 500 MG tablet Take  500 mg by mouth at bedtime. Takes 2 tablets in the morning and 2.5 tablets in the evening    [provider]  levothyroxine (SYNTHROID) 50 MCG tablet Take 50 mcg by mouth daily before breakfast.    [provider]  LORazepam (ATIVAN) 2 MG/ML injection Inject 1 mL (2 mg total) into the vein See admin instructions. Dilute '2mg'$  of lorazepam with 81m of saline.  Administer IV push over 3-5 minutes as needed for seizures. Patient not taking: Reported on 04/25/2022 01/30/22   DOsvaldo Shipper NP  montelukast (SINGULAIR) 10 MG tablet Take 10 mg by mouth at bedtime. 02/05/18   [provider]  Multiple Vitamin (MULTIVITAMIN WITH MINERALS) TABS Take 1 tablet by mouth daily. Patient not taking: Reported on 04/25/2022    [provider]  Omega-3 Fatty Acids (FISH OIL) 500 MG CAPS Take by mouth. Patient not taking: Reported on 04/25/2022    [provider]  ondansetron (ZOFRAN-ODT) 4 MG disintegrating tablet Take 1 tablet as needed for nausea and vomiting Patient not taking: Reported on 04/01/2022 09/27/15   HJodi Geralds MD  [name] 10 MG  tablet TAKE 1 TABLET BY MOUTH IN THE MORNING, 1 IN THE AFTERNOON AND 2 AT BEDTIME 06/04/21   GRockwell Germany NP  PHENObarbital (LUMINAL) 65 MG/ML injection Withdraw 158m('65mg'$ ) Phenobarbital and 55m33mormal Saline into 5ml82mringe. [place] IV push for 3-5 minutes as needed for recurrent seizures 01/30/22   DoraOsvaldo Shipper  potassium chloride SA (KLOR-CON M20) 20 MEQ tablet TAKE 1 TABLET BY MOUTH EVERY DAY Patient not taking: Reported on 04/25/2022 04/07/22   GoodRockwell [place]  potassium phosphate, monobasic, (K-PHOS) 500 MG tablet Take 1 tablet (500 mg total) by mouth 4 (four) times daily. Patient not taking: Reported on 04/25/2022 03/06/21   HickJodi Geralds  promethazine (PHENERGAN) 25 MG suppository Place 25 mg rectally every 6 (six) hours as needed for nausea or vomiting.  07/04/19   [provider]  rosuvastatin (CRESTOR) 10 MG tablet Take 10 mg by mouth at bedtime. Patient not taking: Reported on 04/25/2022 05/26/16   [provider]  Topiramate ER (TROKENDI XR) 200 MG CP24 Take 1 capsule by mouth at bedtime. Patient not taking: Reported on 04/25/2022 05/29/21   HickJodi Geralds  TROKENDI XR 50 MG CP24 Take 1 capsule at nighttime 03/06/21   HickJodi Geralds  VIMPAT 100 MG TABS TAKE 1 TABLET IN THE MORNING AND TAKE 2 TABLETS AT NIGHT 10/21/21   GoodRockwell [place]    Physical Exam: Vitals:   06/01/22 1355 06/01/22 1400 06/01/22 1500 06/01/22 1551  BP:  (!) '89/58 94/68 98/75 '$  Pulse: 77 77 81 91  Resp: (!) 24 19 (!) 26 18  Temp: 97.9 F (36.6 C)   97.6 F (36.4 C)  TempSrc: Oral   Oral  SpO2: 97% 97% 98% 92%   Physical Exam Vitals reviewed.  Constitutional:      General: He is not in acute distress.    Appearance: He is normal weight.  HENT:     Head: Normocephalic.     Nose: No rhinorrhea.     Mouth/Throat:     Mouth: Mucous membranes are moist.  Eyes:     General: No scleral icterus.    Pupils: Pupils are equal, round, and reactive to  light.  Neck:     Vascular: No JVD.  Cardiovascular:     Rate and Rhythm: Normal rate and regular rhythm.  Heart sounds: S1 normal and S2 normal.  Pulmonary:     Effort: Pulmonary effort is normal. No respiratory distress.     Breath sounds: Examination of the right-upper field reveals rales. Rales present. No wheezing or rhonchi.  Abdominal:     General: Bowel sounds are normal. There is no distension.     Palpations: Abdomen is soft.     Tenderness: There is no abdominal tenderness. There is no right CVA tenderness, left CVA tenderness or guarding.  Musculoskeletal:     Cervical back: Neck supple.     Right lower leg: No edema.     Left lower leg: No edema.  Skin:    General: Skin is warm and dry.  Neurological:     General: No focal deficit present.     Mental Status: He is alert. Mental status is at baseline.  Psychiatric:        Mood and Affect: Mood normal.   Data Reviewed:  There are no new results to review at this time.  Assessment and Plan: Principal Problem:   Right upper lobe pneumonia Admit to PCU/inpatient. As needed supplemental oxygen. As needed bronchodilators. Continue ceftriaxone 1 g IVPB daily. Continue azithromycin 500 mg IVPB daily. Check strep pneumoniae urinary antigen. Check sputum Gram stain, culture and sensitivity. Follow-up blood culture and sensitivity. Follow-up CBC and chemistry in the morning.  Active Problems:   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[place])   Partial epilepsy with intractable epilepsy ([facility]) Continue Choloxin 10 mg daily and 20 mg at bedtime.   Continue Vimpat 100 mg p.o. daily and 200 mg at bedtime. Continue Keppra 1000 mg daily and 250 mg at bedtime. Continue topiramate XR 250 mg p.o. at bedtime. Patient is also on carbamazepine for migraine prevention. Lorazepam and phenobarbital as needed for seizures. Follow-up with neurology as an outpatient.    Mild intellectual disability Supportive  care.    Hypothyroidism Continue levothyroxine 25 mg p.o. daily.    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 3 times daily.    Hyperlipidemia Continue Crestor 10 mg p.o. daily.    Allergic rhinitis Continue montelukast 10 mg p.o. daily.   Advance Care Planning:   Code Status: Full Code   Consults:   Family Communication: Both parents were at bedside.  Severity of Illness: The appropriate patient status for this patient is INPATIENT. Inpatient status is judged to be reasonable and necessary in order to provide the required intensity of service to ensure the patient's safety. The patient's presenting symptoms, physical exam findings, and initial radiographic and laboratory data in the context of their chronic comorbidities is felt to place them at high risk for further clinical deterioration. Furthermore, it is not anticipated that the patient will be medically stable for discharge from the hospital within 2 midnights of admission.   * I certify that at the point of admission it is my clinical judgment that the patient will require inpatient hospital care spanning beyond 2 midnights from the point of admission due to high intensity of service, high risk for further deterioration and high frequency of surveillance required.*  Author: Reubin Milan, MD 06/01/2022 4:13 PM  For on call review [email or web address].   This document was prepared using Dragon voice recognition software and may contain some unintended transcription errors.

## 2022-06-01 NOTE — ED Provider Notes (Signed)
[facility]ERGENC[facility] DEPT Provider Note   CSN: [id number] Arrival date & time: 06/01/22  0830     History  Chief Complaint  Patient presents with   Chest Pain   Wheezing    [name] is a 36 y.o. male.  Patient presents with decreased appetite, lower chest discomfort and left lower abdominal pain.  Mild pleuritic component.  Patient had history of blood clots associated with falling on his right port.  History of seizures, not currently on anticoagulants, no known heart problems, mental retardation history with Brown syndrome.  Patient had viral-like symptoms with mild cough, decreased appetite for a few days that improved and then today he was feeling worse and new symptoms with chest discomfort lower and abdominal discomfort.  No known kidney stone history.  This is newer for patient.  Patient has had pneumonia in the past however cough is minimal.  Decreased appetite and oral fluid intake.       Home Medications Prior to Admission medications   Medication Sig Start Date End Date Taking? Authorizing Provider  acetaminophen (TYLENOL) 500 MG tablet Take 1,000 mg by mouth every 6 (six) hours as needed for headache. Patient not taking: Reported on 04/01/2022    [provider]  albuterol (ACCUNEB) 1.25 MG/3ML nebulizer solution Take 1 ampule by nebulization every 4 (four) hours as needed for wheezing. Patient not taking: Reported on 04/25/2022    [provider]  carbamazepine (TEGRETOL) 100 MG chewable tablet CHEW 2 TABLETS IN THE MORNING, 2 TABLETS AT MIDDAY AND 1 & 1/2 TABLETS IN THE EVENING Patient taking differently: CHEW 1 1/2TABLETS IN THE MORNING, 1 1/2 TABLETS AT MIDDAY AND 1 & 1/2 TABLETS IN THE EVENING 03/06/21   Jodi Geralds, MD  cetirizine (ZYRTEC) 10 MG tablet Take 10 mg by mouth daily. Patient not taking: Reported on 04/25/2022 02/12/15   [provider]  Dextromethorphan-guaiFENesin 5-100 MG/5ML LIQD Take 10 mLs  by mouth 2 (two) times daily as needed (cough). Patient not taking: Reported on 04/01/2022    [provider]  diazepam (VALIUM) 2 MG tablet Take 1 tablet every 6 - 8 hours as needed for anxiety or seizures Patient not taking: Reported on 04/01/2022 05/01/20   Jodi Geralds, MD  Flaxseed, Linseed, (FLAXSEED OIL) 1000 MG CAPS Take 1,000 mg by mouth daily. Patient not taking: Reported on 04/25/2022    [provider]  guaiFENesin (MUCINEX) 600 MG 12 hr tablet Take 600 mg by mouth 2 (two) times daily. Patient not taking: Reported on 04/01/2022    [provider]  Javier Docker Oil (OMEGA-3) 500 MG CAPS Take 500 mg by mouth daily. Patient not taking: Reported on 04/01/2022    [provider]  levETIRAcetam (KEPPRA) 500 MG tablet Take 500 mg by mouth at bedtime. Takes 2 tablets in the morning and 2.5 tablets in the evening    [provider]  levothyroxine (SYNTHROID) 50 MCG tablet Take 50 mcg by mouth daily before breakfast.    [provider]  LORazepam (ATIVAN) 2 MG/ML injection Inject 1 mL (2 mg total) into the vein See admin instructions. Dilute '2mg'$  of lorazepam with 52m of saline.  Administer IV push over 3-5 minutes as needed for seizures. Patient not taking: Reported on 04/25/2022 01/30/22   DOsvaldo Shipper NP  montelukast (SINGULAIR) 10 MG tablet Take 10 mg by mouth at bedtime. 02/05/18   [provider]  Multiple Vitamin (MULTIVITAMIN WITH MINERALS) TABS Take 1 tablet by mouth  daily. Patient not taking: Reported on 04/25/2022    [provider]  Omega-3 Fatty Acids (FISH OIL) 500 MG CAPS Take by mouth. Patient not taking: Reported on 04/25/2022    [provider]  ondansetron (ZOFRAN-ODT) 4 MG disintegrating tablet Take 1 tablet as needed for nausea and vomiting Patient not taking: Reported on 04/01/2022 09/27/15   Jodi Geralds, MD  [name] 10 MG tablet TAKE 1 TABLET BY MOUTH IN THE MORNING, 1 IN THE AFTERNOON AND 2 AT BEDTIME  06/04/21   Rockwell Germany, NP  PHENObarbital (LUMINAL) 65 MG/ML injection Withdraw 466m ('65mg'$ ) Phenobarbital and 4366mNormal Saline into 66m67myringe. [place] IV push for 3-5 minutes as needed for recurrent seizures 01/30/22   DorOsvaldo ShipperP  potassium chloride SA (KLOR-CON M20) 20 MEQ tablet TAKE 1 TABLET BY MOUTH EVERY DAY Patient not taking: Reported on 04/25/2022 04/07/22   GooRockwell GermanyP  potassium phosphate, monobasic, (K-PHOS) 500 MG tablet Take 1 tablet (500 mg total) by mouth 4 (four) times daily. Patient not taking: Reported on 04/25/2022 03/06/21   HicJodi GeraldsD  promethazine (PHENERGAN) 25 MG suppository Place 25 mg rectally every 6 (six) hours as needed for nausea or vomiting.  07/04/19   [provider]  rosuvastatin (CRESTOR) 10 MG tablet Take 10 mg by mouth at bedtime. Patient not taking: Reported on 04/25/2022 05/26/16   [provider]  Topiramate ER (TROKENDI XR) 200 MG CP24 Take 1 capsule by mouth at bedtime. Patient not taking: Reported on 04/25/2022 05/29/21   HicJodi GeraldsD  TROKENDI XR 50 MG CP24 Take 1 capsule at nighttime 03/06/21   HicJodi GeraldsD  VIMPAT 100 MG TABS TAKE 1 TABLET IN THE MORNING AND TAKE 2 TABLETS AT NIGHT 10/21/21   GooRockwell GermanyP      Allergies    Antihistamines, chlorpheniramine-type; Divalproex sodium; Phenytoin; Valproic acid and related; and Vancomycin    Review of Systems   Review of Systems  Constitutional:  Negative for chills and fever.  HENT:  Negative for congestion.   Eyes:  Negative for visual disturbance.  Respiratory:  Positive for cough. Negative for shortness of breath.   Cardiovascular:  Positive for chest pain. Negative for leg swelling.  Gastrointestinal:  Positive for abdominal pain and nausea.  Genitourinary:  Negative for dysuria and flank pain.  Musculoskeletal:  Negative for back pain, neck pain and neck stiffness.  Skin:  Negative for rash.  Neurological:  Negative for  light-headedness and headaches.    Physical Exam Updated Vital Signs BP 94/68   Pulse 81   Temp 97.9 F (36.6 C) (Oral)   Resp (!) 26   SpO2 98%  Physical Exam Vitals and nursing note reviewed.  Constitutional:      General: He is not in acute distress.    Appearance: He is well-developed.  HENT:     Head: Normocephalic and atraumatic.     Comments: Dry mucous membranes    Mouth/Throat:     Mouth: Mucous membranes are moist.  Eyes:     General:        Right eye: No discharge.        Left eye: No discharge.     Conjunctiva/sclera: Conjunctivae normal.  Neck:     Trachea: No tracheal deviation.  Cardiovascular:     Rate and Rhythm: Normal rate and regular rhythm.  Pulmonary:     Effort: Pulmonary effort is normal.     Breath sounds: Examination  of the right-middle field reveals decreased breath sounds. Examination of the right-lower field reveals decreased breath sounds. Decreased breath sounds present.  Abdominal:     General: There is no distension.     Palpations: Abdomen is soft.     Tenderness: There is abdominal tenderness (left sided). There is no guarding.  Musculoskeletal:     Cervical back: Normal range of motion and neck supple. No rigidity.     Right lower leg: No edema.     Left lower leg: No edema.  Skin:    General: Skin is warm.     Capillary Refill: Capillary refill takes 2 to 3 seconds.     Findings: No rash.  Neurological:     General: No focal deficit present.     Mental Status: He is alert.  Psychiatric:        Mood and Affect: Mood normal.     ED Results / Procedures / Treatments   Labs (all labs ordered are listed, but only abnormal results are displayed) Labs Reviewed  CBC WITH DIFFERENTIAL/PLATELET - Abnormal; Notable for the following components:      Result Value   Hemoglobin 12.0 (*)    HCT 36.2 (*)    Neutro Abs 8.0 (*)    All other components within normal limits  COMPREHENSIVE METABOLIC PANEL - Abnormal; Notable for the  following components:   Glucose, Bld 136 (*)    All other components within normal limits  LACTIC ACID, PLASMA - Abnormal; Notable for the following components:   Lactic Acid, Venous 0.4 (*)    All other components within normal limits  PROTIME-INR - Abnormal; Notable for the following components:   Prothrombin Time 15.3 (*)    All other components within normal limits  SARS CORONAVIRUS 2 BY RT PCR  CULTURE, BLOOD (ROUTINE X 2)  CULTURE, BLOOD (ROUTINE X 2)  URINE CULTURE  APTT  URINALYSIS, ROUTINE W REFLEX MICROSCOPIC  LACTIC ACID, PLASMA  TROPONIN I (HIGH SENSITIVITY)    EKG EKG Interpretation  Date/Time:  Sunday June 01 2022 08:45:01 EDT Ventricular Rate:  85 PR Interval:  168 QRS Duration: 102 QT Interval:  366 QTC Calculation: 436 R Axis:   58 Text Interpretation: Sinus rhythm Probable left ventricular hypertrophy Similar to Mar 2021 Confirmed by Elnora Morrison [phone]) on 06/01/2022 9:02:21 AM  Radiology CT Angio Chest Pulmonary Embolism (PE) W or WO Contrast  Result Date: 06/01/2022 CLINICAL DATA:  Loss of appetite. Lower chest/epigastric area discomfort. Recent weight loss. EXAM: CT ANGIOGRAPHY CHEST CT ABDOMEN AND PELVIS WITH CONTRAST TECHNIQUE: Multidetector CT imaging of the chest was performed using the standard protocol during bolus administration of intravenous contrast. Multiplanar CT image reconstructions and MIPs were obtained to evaluate the vascular anatomy. Multidetector CT imaging of the abdomen and pelvis was performed using the standard protocol during bolus administration of intravenous contrast. RADIATION DOSE REDUCTION: This exam was performed according to the departmental dose-optimization program which includes automated exposure control, adjustment of the mA and/or kV according to patient size and/or use of iterative reconstruction technique. CONTRAST:  58m OMNIPAQUE IOHEXOL 350 MG/ML SOLN COMPARISON:  CT chest 09/01/11.  CT AP 09/12/2009. FINDINGS:  CTA CHEST FINDINGS Cardiovascular: Diminished exam detail secondary to motion artifact. There is satisfactory opacification of the main pulmonary artery and its branches to the proximal segmental level. No signs of acute pulmonary embolism. There is a left chest wall nerve root stimulator with lead extending into the left lower neck. Right chest wall port a  catheter noted with tip in the distal SVC. Extensive the left chest wall collaterals are identified. Secondary to known extensive bilateral central venous occlusion. Mediastinum/Nodes: Thyroid gland, trachea, and esophagus are unremarkable. No enlarged axillary, mediastinal, or hilar lymph nodes. Lungs/Pleura: Small right pleural effusion. Airspace opacity within the right upper lobe is identified which is concerning for pneumonia. This area measures approximately 4.6 x 2.5 cm, image 51/6 Musculoskeletal: No acute findings. Review of the MIP images confirms the above findings. CT ABDOMEN and PELVIS FINDINGS Hepatobiliary: No focal liver abnormality is seen. No gallstones, gallbladder wall thickening, or biliary dilatation. Pancreas: Unremarkable. No pancreatic ductal dilatation or surrounding inflammatory changes. Spleen: Normal in size without focal abnormality. Adrenals/Urinary Tract: Normal adrenal glands. No kidney mass, nephrolithiasis, or hydronephrosis. No signs of hydroureter or ureteral lithiasis. Bladder is unremarkable. Stomach/Bowel: Stomach appears normal. The appendix is visualized and appears normal. No bowel wall thickening, inflammation, or distension. Vascular/Lymphatic: Mild aortic atherosclerotic calcification. No aneurysm. No signs of abdominopelvic adenopathy. Reproductive: Prostate is unremarkable. Other: No free fluid or fluid collections.  No pneumoperitoneum. Musculoskeletal: No acute or significant osseous findings. Review of the MIP images confirms the above findings. IMPRESSION: 1. No evidence for acute pulmonary embolism. 2. Airspace  opacity within the right upper lobe concerning for pneumonia. 3. Small right pleural effusion. 4. No evidence for acute intra-abdominal or pelvic abnormality. 5. Extensive chest wall collaterals compatible with known chronic bilateral central venous occlusion. 6. Aortic Atherosclerosis (ICD10-I70.0). Electronically Signed   By: Kerby Moors M.D.   On: 06/01/2022 12:41   CT Renal Stone Study  Result Date: 06/01/2022 CLINICAL DATA:  Loss of appetite. Lower chest/epigastric area discomfort. Recent weight loss. EXAM: CT ANGIOGRAPHY CHEST CT ABDOMEN AND PELVIS WITH CONTRAST TECHNIQUE: Multidetector CT imaging of the chest was performed using the standard protocol during bolus administration of intravenous contrast. Multiplanar CT image reconstructions and MIPs were obtained to evaluate the vascular anatomy. Multidetector CT imaging of the abdomen and pelvis was performed using the standard protocol during bolus administration of intravenous contrast. RADIATION DOSE REDUCTION: This exam was performed according to the departmental dose-optimization program which includes automated exposure control, adjustment of the mA and/or kV according to patient size and/or use of iterative reconstruction technique. CONTRAST:  42m OMNIPAQUE IOHEXOL 350 MG/ML SOLN COMPARISON:  CT chest 09/01/11.  CT AP 09/12/2009. FINDINGS: CTA CHEST FINDINGS Cardiovascular: Diminished exam detail secondary to motion artifact. There is satisfactory opacification of the main pulmonary artery and its branches to the proximal segmental level. No signs of acute pulmonary embolism. There is a left chest wall nerve root stimulator with lead extending into the left lower neck. Right chest wall port a catheter noted with tip in the distal SVC. Extensive the left chest wall collaterals are identified. Secondary to known extensive bilateral central venous occlusion. Mediastinum/Nodes: Thyroid gland, trachea, and esophagus are unremarkable. No enlarged  axillary, mediastinal, or hilar lymph nodes. Lungs/Pleura: Small right pleural effusion. Airspace opacity within the right upper lobe is identified which is concerning for pneumonia. This area measures approximately 4.6 x 2.5 cm, image 51/6 Musculoskeletal: No acute findings. Review of the MIP images confirms the above findings. CT ABDOMEN and PELVIS FINDINGS Hepatobiliary: No focal liver abnormality is seen. No gallstones, gallbladder wall thickening, or biliary dilatation. Pancreas: Unremarkable. No pancreatic ductal dilatation or surrounding inflammatory changes. Spleen: Normal in size without focal abnormality. Adrenals/Urinary Tract: Normal adrenal glands. No kidney mass, nephrolithiasis, or hydronephrosis. No signs of hydroureter or ureteral lithiasis. Bladder is unremarkable. Stomach/Bowel:  Stomach appears normal. The appendix is visualized and appears normal. No bowel wall thickening, inflammation, or distension. Vascular/Lymphatic: Mild aortic atherosclerotic calcification. No aneurysm. No signs of abdominopelvic adenopathy. Reproductive: Prostate is unremarkable. Other: No free fluid or fluid collections.  No pneumoperitoneum. Musculoskeletal: No acute or significant osseous findings. Review of the MIP images confirms the above findings. IMPRESSION: 1. No evidence for acute pulmonary embolism. 2. Airspace opacity within the right upper lobe concerning for pneumonia. 3. Small right pleural effusion. 4. No evidence for acute intra-abdominal or pelvic abnormality. 5. Extensive chest wall collaterals compatible with known chronic bilateral central venous occlusion. 6. Aortic Atherosclerosis (ICD10-I70.0). Electronically Signed   By: Kerby Moors M.D.   On: 06/01/2022 12:41   DG Chest 2 View  Result Date: 06/01/2022 CLINICAL DATA:  Chest pain and shortness of breath. EXAM: CHEST - 2 VIEW COMPARISON:  12/06/2019 FINDINGS: Right subclavian [name] unchanged. The long nerve stimulator device unchanged  over the anterior left chest wall with lead extending into the left neck. Lungs are adequately inflated. No focal airspace consolidation or effusion. Cardiomediastinal silhouette and remainder of the exam is unchanged. IMPRESSION: No acute cardiopulmonary disease. Electronically Signed   By: Marin Olp M.D.   On: 06/01/2022 09:28    Procedures .Critical Care  Performed by: Elnora Morrison, MD Authorized by: Elnora Morrison, MD   Critical care provider statement:    Critical care time (minutes):  30   Critical care start time:  06/01/2022 1:00 PM   Critical care end time:  06/01/2022 1:30 PM   Critical care time was exclusive of:  Separately billable procedures and treating other patients and teaching time   Critical care was necessary to treat or prevent imminent or life-threatening deterioration of the following conditions:  Sepsis   Critical care was time spent personally by me on the following activities:  Development of treatment plan with patient or surrogate, evaluation of patient's response to treatment, examination of patient, ordering and review of laboratory studies, ordering and review of radiographic studies, ordering and performing treatments and interventions, pulse oximetry, re-evaluation of patient's condition and review of old charts     Medications Ordered in ED Medications  albuterol (VENTOLIN HFA) 108 (90 Base) MCG/ACT inhaler 2 puff (has no administration in time range)  azithromycin (ZITHROMAX) 500 mg in sodium chloride 0.9 % 250 mL IVPB (500 mg Intravenous New Bag/Given 06/01/22 1459)  0.9 %  sodium chloride infusion (10 mL/hr Intravenous New Bag/Given 06/01/22 1347)  albuterol (PROVENTIL) (2.5 MG/3ML) 0.083% nebulizer solution (2.5 mg  Given 06/01/22 0849)  acetaminophen (TYLENOL) tablet 1,000 mg (1,000 mg Oral Given 06/01/22 1052)  sodium chloride 0.9 % bolus 1,000 mL (0 mLs Intravenous Stopped 06/01/22 1319)  iohexol (OMNIPAQUE) 350 MG/ML injection 100 mL (85 mLs  Intravenous Contrast Given 06/01/22 1217)  cefTRIAXone (ROCEPHIN) 1 g in sodium chloride 0.9 % 100 mL IVPB (0 g Intravenous Stopped 06/01/22 1511)  lactated ringers bolus 1,000 mL (1,000 mLs Intravenous New Bag/Given 06/01/22 1503)    ED Course/ Medical Decision Making/ A&P                           Medical Decision Making Amount and/or Complexity of Data Reviewed Labs: ordered. Radiology: ordered.  Risk OTC drugs. Prescription drug management. Decision regarding hospitalization.   Patient presents with worsening signs and symptoms throughout the week.  Initially patient's symptoms likely viral in origin with cough, decreased appetite which improved however with patient  now having new and worsening signs and symptoms differential more broad including viral syndrome, kidney stone, diverticulitis/colitis, pulmonary embolism/pneumonia with pleuritic component, dehydration component with either, other.  Plan for general blood work, cardiac screening, IV fluid bolus patient clinically dehydrated reviewed last echocardiogram and normal systolic ejection fraction.  Albuterol given and patient has no significant wheeze on reassessment however does have decreased air movement.  Parents with patient and ever and comfortable with the plan to do CT scans for further delineation.  Patient's blood work reviewed independently overall reassuring hemoglobin 12, normal white blood cell count, electrolytes unremarkable.  CT scan results independently reviewed showing concern for right pneumonia which is consistent with exam/presentation.  Patient not requiring oxygen, normal work of breathing however blood pressure for over an hour in the 80s.  Repeat IV fluid bolus given, sepsis screening labs added including lactate, blood cultures and urine culture.  Discussed with hospitalist initially plan for stepdown and if patient improves clinically the blood pressure can be downgraded to telemetry.  On reassessment  patient feels improved tolerated oral meal and blood pressure in the 90s.  Repaged hospitalist to change to telemetry admission.        Final Clinical Impression(s) / ED Diagnoses Final diagnoses:  Abdominal pain, left lower quadrant  Acute chest pain  Wheezing  Community acquired pneumonia of right upper lobe of lung    Rx / DC Orders ED Discharge Orders     None         Elnora Morrison, MD 06/01/22 1540

## 2022-06-01 NOTE — ED Triage Notes (Signed)
He si here with his mother. He reports recent loss of appetite, some lower chest/epigastric area discomfort and recent wt. Loss. He is in no distress.

## 2022-06-01 NOTE — ED Notes (Signed)
Patient with diminished breath sounds, expiratory wheezing noted in upper airway. Patient with mild labored breathing. Albuterol neb given at this time. 98% on RA.

## 2022-06-02 DIAGNOSIS — J189 Pneumonia, unspecified organism: Secondary | ICD-10-CM | POA: Diagnosis not present

## 2022-06-02 LAB — COMPREHENSIVE METABOLIC PANEL
ALT: 37 U/L (ref 0–44)
AST: 19 U/L (ref 15–41)
Albumin: 2.6 g/dL — ABNORMAL LOW (ref 3.5–5.0)
Alkaline Phosphatase: 87 U/L (ref 38–126)
Anion gap: 6 (ref 5–15)
BUN: 8 mg/dL (ref 6–20)
CO2: 23 mmol/L (ref 22–32)
Calcium: 8 mg/dL — ABNORMAL LOW (ref 8.9–10.3)
Chloride: 110 mmol/L (ref 98–111)
Creatinine, Ser: 0.86 mg/dL (ref 0.61–1.24)
GFR, Estimated: >60 mL/min (ref >60)
Glucose, Bld: 108 mg/dL — ABNORMAL HIGH (ref 70–99)
Potassium: 3.3 mmol/L — ABNORMAL LOW (ref 3.5–5.1)
Sodium: 139 mmol/L (ref 135–145)
Total Bilirubin: 0.6 mg/dL (ref 0.3–1.2)
Total Protein: 6.8 g/dL (ref 6.5–8.1)

## 2022-06-02 LAB — CBC
HCT: 32.2 % — ABNORMAL LOW (ref 39.0–52.0)
Hemoglobin: 10.4 g/dL — ABNORMAL LOW (ref 13.0–17.0)
MCH: 28.9 pg (ref 26.0–34.0)
MCHC: 32.3 g/dL (ref 30.0–36.0)
MCV: 89.4 fL (ref 80.0–100.0)
Platelets: 319 10*3/uL (ref 150–400)
RBC: 3.6 MIL/uL — ABNORMAL LOW (ref 4.22–5.81)
RDW: 12.8 % (ref 11.5–15.5)
WBC: 7.7 10*3/uL (ref 4.0–10.5)
nRBC: 0 % (ref 0.0–0.2)

## 2022-06-02 LAB — URINE CULTURE: Culture: NO GROWTH

## 2022-06-02 LAB — STREP PNEUMONIAE URINARY ANTIGEN: Strep Pneumo Urinary Antigen: NEGATIVE

## 2022-06-02 MED ORDER — ACETAMINOPHEN 10 MG/ML IV SOLN: 1000.0000 mg | Freq: Four times a day (QID) | INTRAVENOUS | Status: DC

## 2022-06-02 MED ORDER — POTASSIUM CHLORIDE CRYS ER 20 MEQ PO TBCR
40.0000 meq | EXTENDED_RELEASE_TABLET | Freq: Once | ORAL | Status: AC
Start: 2022-06-02 — End: 2022-06-02
  Administered 2022-06-02: 40 meq via ORAL
  Filled 2022-06-02: qty 2

## 2022-06-02 MED ORDER — LACTATED RINGERS IV SOLN: INTRAVENOUS | Status: DC

## 2022-06-02 MED ORDER — ACETAMINOPHEN 10 MG/ML IV SOLN
1000.0000 mg | Freq: Four times a day (QID) | INTRAVENOUS | Status: AC
  Administered 2022-06-02 (×2): 1000 mg via INTRAVENOUS
  Filled 2022-06-02 (×2): qty 100

## 2022-06-02 NOTE — Progress Notes (Signed)
Removed blanket. Sheet left in place

## 2022-06-02 NOTE — Progress Notes (Signed)
PROGRESS NOTE    [name]  VZC:[id number] DOB: [birth year] DOA: 06/01/2022 PCP: Chesley Noon, MD     Brief Narrative:  [name] is a 36 y.o. male with medical history significant of allergic rhinitis, Brown syndrome, growth disorder, migraine headaches, moderate mitral regurgitation/MVP, mild mental retardation, hyperlipidemia, hypokalemia, seizure disorder, sleep apnea on CPAP, hypothyroidism, PAF not on AC is coming to the emergency department with pleuritic chest pain, wheezing, dyspnea, productive cough of whitish sputum, fatigue, malaise, decreased appetite, low-grade fever and chills.  Imaging revealed right upper lobe consolidation.  Patient was started on IV Rocephin and azithromycin and admitted to the hospital.  New events last 24 hours / Subjective: Parents are at bedside.  Patient is having productive cough.  While I was in the room, patient had coughing episode followed by vomiting.  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[place])   Mild intellectual disability   Hypothyroidism   Chronic migraine without aura with status migrainosus, not intractable   Hyperlipidemia   Right upper lobe CAP -COVID-negative -Strep pneumo antigen negative -Rocephin, azithromycin  History of generalized epilepsy -Home meds include clobazam, Vimpat, Keppra, topiramate -Lorazepam and phenobarbital as needed for seizure  Hypothyroidism -Synthroid  Chronic migraines without aura -Carbamazepine  Hyperlipidemia -Crestor  Hypokalemia -Replace  DVT prophylaxis:  enoxaparin (LOVENOX) injection 40 mg Start: 06/01/22 2200  Code Status: Full code Family Communication: Parents at bedside Disposition Plan:   Status is: Inpatient Remains inpatient appropriate because: IV antibiotics   Antimicrobials:  Anti-infectives (From admission, onward)    Start     Dose/Rate Route Frequency Ordered Stop   06/02/22 1200  cefTRIAXone (ROCEPHIN) 2 g in sodium chloride 0.9 % 100 mL IVPB        2 g 200 mL/hr over 30 Minutes Intravenous Every 24 hours 06/01/22 1612 06/07/22 1159   06/02/22 1200  azithromycin (ZITHROMAX) 500 mg in sodium chloride 0.9 % 250 mL IVPB        500 mg 250 mL/hr over 60 Minutes Intravenous Every 24 hours 06/01/22 1612 06/07/22 1159   06/01/22 1330  cefTRIAXone (ROCEPHIN) 1 g in sodium chloride 0.9 % 100 mL IVPB        1 g 200 mL/hr over 30 Minutes Intravenous  Once 06/01/22 1324 06/01/22 1511   06/01/22 1330  azithromycin (ZITHROMAX) 500 mg in sodium chloride 0.9 % 250 mL IVPB        500 mg 250 mL/hr over 60 Minutes Intravenous  Once 06/01/22 1324 06/01/22 1600        Objective: Vitals:   06/02/22 0027 06/02/22 0400 06/02/22 0510 06/02/22 0713  BP: (!) 95/52 (!) 87/55 (!) 87/55   Pulse: 89 82 82   Resp: '20 20 19   '$ Temp: 98.6 F (37 C) 98 F (36.7 C) 98 F (36.7 C) 99.5 F (37.5 C)  TempSrc:  Oral Oral Axillary  SpO2: 97% 96% 96%     Intake/Output Summary (Last 24 hours) at 06/02/2022 1254 Last data filed at 06/01/2022 2100 Gross per 24 hour  Intake 2354.94 ml  Output 1100 ml  Net 1254.94 ml   There were no vitals filed for this visit.  Examination:  General exam: Appears calm  Respiratory system: Clear to auscultation anteriorly  Cardiovascular system: S1 & S2 heard, RRR. No murmurs. No pedal edema. Gastrointestinal system: Abdomen is nondistended, soft and nontender. Normal bowel sounds heard. Central nervous system: Alert  Extremities: Symmetric in appearance  Skin: No rashes, lesions or ulcers on exposed skin   Data Reviewed: I have personally reviewed following labs and imaging studies  CBC: Recent Labs  Lab 06/01/22 1051 06/02/22 0331  WBC 9.9  7.7  NEUTROABS 8.0*  --   HGB 12.0* 10.4*  HCT 36.2* 32.2*  MCV 85.8 89.4  PLT 375 427   Basic Metabolic Panel: Recent Labs  Lab 06/01/22 1051 06/02/22 0331  NA 136 139  K 3.7 3.3*  CL 103 110  CO2 22 23  GLUCOSE 136* 108*  BUN 10 8  CREATININE 0.92 0.86  CALCIUM 9.2 8.0*   GFR: CrCl cannot be calculated (Unknown ideal weight.). Liver Function Tests: Recent Labs  Lab 06/01/22 1051 06/02/22 0331  AST 20 19  ALT 39 37  ALKPHOS 108 87  BILITOT 0.5 0.6  PROT 7.8 6.8  ALBUMIN 3.9 2.6*   No results for input(s): "LIPASE", "AMYLASE" in the last 168 hours. No results for input(s): "AMMONIA" in the last 168 hours. Coagulation Profile: Recent Labs  Lab 06/01/22 1324  INR 1.2   Cardiac Enzymes: No results for input(s): "CKTOTAL", "CKMB", "CKMBINDEX", "TROPONINI" in the last 168 hours. BNP (last 3 results) No results for input(s): "PROBNP" in the last 8760 hours. HbA1C: No results for input(s): "HGBA1C" in the last 72 hours. CBG: No results for input(s): "GLUCAP" in the last 168 hours. Lipid Profile: No results for input(s): "CHOL", "HDL", "LDLCALC", "TRIG", "CHOLHDL", "LDLDIRECT" in the last 72 hours. Thyroid Function Tests: No results for input(s): "TSH", "T4TOTAL", "FREET4", "T3FREE", "THYROIDAB" in the last 72 hours. Anemia Panel: No results for input(s): "VITAMINB12", "FOLATE", "FERRITIN", "TIBC", "IRON", "RETICCTPCT" in the last 72 hours. Sepsis Labs: Recent Labs  Lab 06/01/22 1324  LATICACIDVEN 0.4*    Recent Results (from the past 240 hour(s))  SARS Coronavirus 2 by RT PCR (hospital order, performed in [facility] hospital lab) *cepheid single result test* Anterior Nasal Swab     Status: None   Collection Time: 06/01/22 10:51 AM   Specimen: Anterior Nasal Swab  Result Value Ref Range Status   SARS Coronavirus 2 by RT PCR NEGATIVE NEGATIVE Final    Comment: (NOTE) SARS-CoV-2 target nucleic acids are NOT DETECTED.  The SARS-CoV-2 RNA is generally  detectable in upper and lower respiratory specimens during the acute phase of infection. The lowest concentration of SARS-CoV-2 viral copies this assay can detect is 250 copies / mL. A negative result does not preclude SARS-CoV-2 infection and should not be used as the sole basis for treatment or other patient management decisions.  A negative result may occur with improper specimen collection / handling, submission of specimen other than nasopharyngeal swab, presence of viral mutation(s) within the areas targeted by this assay, and inadequate number of viral copies (<250 copies / mL). A negative result must be combined with clinical observations, patient history, and epidemiological information.  Fact Sheet for Patients:   https://www.patel.info/  Fact Sheet for Healthcare Providers: https://hall.com/  This test is not yet approved or  cleared by the [place] FDA and has been authorized for detection and/or diagnosis of SARS-CoV-2 by FDA under an Emergency Use Authorization (EUA).  This EUA will remain in effect (meaning this test can be used) for the duration of the COVID-19 declaration under Section 564(b)(1) of the Act,  21 U.S.C. section 360bbb-3(b)(1), unless the authorization is terminated or revoked sooner.  Performed at [facility], [address] [place], [place] [address]   Blood Culture (routine x 2)     Status: None (Preliminary result)   Collection Time: 06/01/22  1:24 PM   Specimen: BLOOD LEFT FOREARM  Result Value Ref Range Status   Specimen Description   Final    BLOOD LEFT FOREARM Performed at [facility], [address], [place], [place] [address]    Special Requests   Final    BOTTLES DRAWN AEROBIC AND ANAEROBIC Blood Culture adequate volume Performed at [facility], [address] [place], [place] [address]    Culture   Final    NO GROWTH < 24  HOURS Performed at [facility], [place] [address] [place], [place] [address]    Report Status PENDING  Incomplete  Urine Culture     Status: None   Collection Time: 06/01/22  1:24 PM   Specimen: In/Out Cath Urine  Result Value Ref Range Status   Specimen Description   Final    IN/OUT CATH URINE Performed at [facility], [address] [place], [place] [address]    Special Requests   Final    NONE Performed at [facility], [address], [place], [place] [address]    Culture   Final    NO GROWTH Performed at [facility], [place] [address] [place], [place] [address]    Report Status 06/02/2022 FINAL  Final      Radiology Studies: CT Angio Chest Pulmonary Embolism (PE) W or WO Contrast  Result Date: 06/01/2022 CLINICAL DATA:  Loss of appetite. Lower chest/epigastric area discomfort. Recent weight loss. EXAM: CT ANGIOGRAPHY CHEST CT ABDOMEN AND PELVIS WITH CONTRAST TECHNIQUE: Multidetector CT imaging of the chest was performed using the standard protocol during bolus administration of intravenous contrast. Multiplanar CT image reconstructions and MIPs were obtained to evaluate the vascular anatomy. Multidetector CT imaging of the abdomen and pelvis was performed using the standard protocol during bolus administration of intravenous contrast. RADIATION DOSE REDUCTION: This exam was performed according to the departmental dose-optimization program which includes automated exposure control, adjustment of the mA and/or kV according to patient size and/or use of iterative reconstruction technique. CONTRAST:  26m OMNIPAQUE IOHEXOL 350 MG/ML SOLN COMPARISON:  CT chest 09/01/11.  CT AP 09/12/2009. FINDINGS: CTA CHEST FINDINGS Cardiovascular: Diminished exam detail secondary to motion artifact. There is satisfactory opacification of the main pulmonary artery and its branches to the proximal segmental level. No signs of acute pulmonary embolism.  There is a left chest wall nerve root stimulator with lead extending into the left lower neck. Right chest wall port a catheter noted with tip in the distal SVC. Extensive the left chest wall collaterals are identified. Secondary to known extensive bilateral central venous occlusion. Mediastinum/Nodes: Thyroid gland, trachea, and esophagus are unremarkable. No enlarged axillary, mediastinal, or hilar lymph nodes. Lungs/Pleura: Small right pleural effusion. Airspace opacity within the right upper lobe is identified which is concerning for pneumonia. This area measures approximately 4.6 x 2.5 cm, image 51/6 Musculoskeletal: No acute findings. Review of the MIP images confirms the above findings. CT ABDOMEN and PELVIS FINDINGS Hepatobiliary: No focal liver abnormality is seen. No gallstones, gallbladder wall thickening, or biliary dilatation. Pancreas: Unremarkable. No pancreatic ductal dilatation or surrounding inflammatory changes. Spleen: Normal in size without focal abnormality. Adrenals/Urinary Tract: Normal adrenal glands. No kidney mass, nephrolithiasis, or  hydronephrosis. No signs of hydroureter or ureteral lithiasis. Bladder is unremarkable. Stomach/Bowel: Stomach appears normal. The appendix is visualized and appears normal. No bowel wall thickening, inflammation, or distension. Vascular/Lymphatic: Mild aortic atherosclerotic calcification. No aneurysm. No signs of abdominopelvic adenopathy. Reproductive: Prostate is unremarkable. Other: No free fluid or fluid collections.  No pneumoperitoneum. Musculoskeletal: No acute or significant osseous findings. Review of the MIP images confirms the above findings. IMPRESSION: 1. No evidence for acute pulmonary embolism. 2. Airspace opacity within the right upper lobe concerning for pneumonia. 3. Small right pleural effusion. 4. No evidence for acute intra-abdominal or pelvic abnormality. 5. Extensive chest wall collaterals compatible with known chronic bilateral  central venous occlusion. 6. Aortic Atherosclerosis (ICD10-I70.0). Electronically Signed   By: Kerby Moors M.D.   On: 06/01/2022 12:41   CT Renal Stone Study  Result Date: 06/01/2022 CLINICAL DATA:  Loss of appetite. Lower chest/epigastric area discomfort. Recent weight loss. EXAM: CT ANGIOGRAPHY CHEST CT ABDOMEN AND PELVIS WITH CONTRAST TECHNIQUE: Multidetector CT imaging of the chest was performed using the standard protocol during bolus administration of intravenous contrast. Multiplanar CT image reconstructions and MIPs were obtained to evaluate the vascular anatomy. Multidetector CT imaging of the abdomen and pelvis was performed using the standard protocol during bolus administration of intravenous contrast. RADIATION DOSE REDUCTION: This exam was performed according to the departmental dose-optimization program which includes automated exposure control, adjustment of the mA and/or kV according to patient size and/or use of iterative reconstruction technique. CONTRAST:  21m OMNIPAQUE IOHEXOL 350 MG/ML SOLN COMPARISON:  CT chest 09/01/11.  CT AP 09/12/2009. FINDINGS: CTA CHEST FINDINGS Cardiovascular: Diminished exam detail secondary to motion artifact. There is satisfactory opacification of the main pulmonary artery and its branches to the proximal segmental level. No signs of acute pulmonary embolism. There is a left chest wall nerve root stimulator with lead extending into the left lower neck. Right chest wall port a catheter noted with tip in the distal SVC. Extensive the left chest wall collaterals are identified. Secondary to known extensive bilateral central venous occlusion. Mediastinum/Nodes: Thyroid gland, trachea, and esophagus are unremarkable. No enlarged axillary, mediastinal, or hilar lymph nodes. Lungs/Pleura: Small right pleural effusion. Airspace opacity within the right upper lobe is identified which is concerning for pneumonia. This area measures approximately 4.6 x 2.5 cm, image  51/6 Musculoskeletal: No acute findings. Review of the MIP images confirms the above findings. CT ABDOMEN and PELVIS FINDINGS Hepatobiliary: No focal liver abnormality is seen. No gallstones, gallbladder wall thickening, or biliary dilatation. Pancreas: Unremarkable. No pancreatic ductal dilatation or surrounding inflammatory changes. Spleen: Normal in size without focal abnormality. Adrenals/Urinary Tract: Normal adrenal glands. No kidney mass, nephrolithiasis, or hydronephrosis. No signs of hydroureter or ureteral lithiasis. Bladder is unremarkable. Stomach/Bowel: Stomach appears normal. The appendix is visualized and appears normal. No bowel wall thickening, inflammation, or distension. Vascular/Lymphatic: Mild aortic atherosclerotic calcification. No aneurysm. No signs of abdominopelvic adenopathy. Reproductive: Prostate is unremarkable. Other: No free fluid or fluid collections.  No pneumoperitoneum. Musculoskeletal: No acute or significant osseous findings. Review of the MIP images confirms the above findings. IMPRESSION: 1. No evidence for acute pulmonary embolism. 2. Airspace opacity within the right upper lobe concerning for pneumonia. 3. Small right pleural effusion. 4. No evidence for acute intra-abdominal or pelvic abnormality. 5. Extensive chest wall collaterals compatible with known chronic bilateral central venous occlusion. 6. Aortic Atherosclerosis (ICD10-I70.0). Electronically Signed   By: TKerby MoorsM.D.   On: 06/01/2022 12:41   DG Chest 2 View  Result Date: 06/01/2022 CLINICAL DATA:  Chest pain and shortness of breath. EXAM: CHEST - 2 VIEW COMPARISON:  12/06/2019 FINDINGS: Right subclavian [name] unchanged. The long nerve stimulator device unchanged over the anterior left chest wall with lead extending into the left neck. Lungs are adequately inflated. No focal airspace consolidation or effusion. Cardiomediastinal silhouette and remainder of the exam is unchanged. IMPRESSION: No  acute cardiopulmonary disease. Electronically Signed   By: Marin Olp M.D.   On: 06/01/2022 09:28      Scheduled Meds:  carbamazepine  150 mg Oral TID   Chlorhexidine Gluconate Cloth  6 each Topical Daily   cloBAZam  10 mg Oral BID   cloBAZam  20 mg Oral QHS   enoxaparin (LOVENOX) injection  40 mg Subcutaneous Q24H   guaiFENesin  600 mg Oral BID   lacosamide  100 mg Oral Daily   lacosamide  200 mg Oral QHS   levETIRAcetam  1,000 mg Oral Daily   levETIRAcetam  1,250 mg Oral QHS   levothyroxine  50 mcg Oral QAC breakfast   montelukast  10 mg Oral QHS   potassium chloride SA  40 mEq Oral Once   rosuvastatin  10 mg Oral QHS   topiramate ER  200 mg Oral QHS   And   topiramate ER  50 mg Oral QHS   Continuous Infusions:  sodium chloride 10 mL/hr at 06/01/22 1800   acetaminophen     azithromycin     cefTRIAXone (ROCEPHIN)  IV     lactated ringers 75 mL/hr at 06/02/22 1026     LOS: 1 day     Dessa Phi, DO [facility] 06/02/2022, 12:54 PM   Available via Epic secure chat 7am-7pm After these hours, please refer to coverage provider listed on [email or web address]

## 2022-06-02 NOTE — TOC Initial Note (Signed)
Transition of Care [facility]) - Initial/Assessment Note    Patient Details  Name: [name] MRN: [id number] Date of Birth: 01-08-1986  [facility] CM/SW Contact:    Henrietta Dine, RN Phone Number: 06/02/2022, 3:18 PM  Clinical Narrative: pt from home with mom; plans to return home; has PCP, and own transport home; will continue to follow.              Expected Discharge Plan: Home/Self Care Barriers to Discharge: Continued Medical Work up   Patient Goals and CMS Choice Patient states their goals for this hospitalization and ongoing recovery are:: home      Expected Discharge Plan and Services Expected Discharge Plan: Home/Self Care   Discharge Planning Services: CM Consult   Living arrangements for the past 2 months: Single Family Home                                      Prior Living Arrangements/Services Living arrangements for the past 2 months: Single Family Home Lives with:: Parents Levonne Spiller (mother) [phone]) Patient language and need for interpreter reviewed:: Yes Do you feel safe going back to the place where you live?: Yes      Need for Family Participation in Patient Care: Yes (Comment) Care giver support system in place?: Yes (comment) Current home services: DME (oxygen concentrator & portable tanks (from [place]), manual & motorized wheel chairs, cpap (from [place]); caseworker from [place]) Criminal Activity/Legal Involvement Pertinent to Current Situation/Hospitalization: No - Comment as needed  Activities of Daily Living   ADL Screening (condition at time of admission) Is the patient deaf or have difficulty hearing?: No Does the patient have difficulty seeing, even when wearing glasses/contacts?: No Does the patient have difficulty concentrating, remembering, or making decisions?: No Does the patient have difficulty dressing or bathing?: Yes Does the patient have difficulty walking or climbing  stairs?: Yes  Permission Sought/Granted Permission sought to share information with : Case Manager Permission granted to share information with : Yes, Verbal Permission Granted              Emotional Assessment Appearance:: Appears stated age Attitude/Demeanor/Rapport: Gracious Affect (typically observed): Accepting Orientation: : Oriented to Self, Oriented to Place, Oriented to  Time, Oriented to Situation Alcohol / Substance Use: Not Applicable Psych Involvement: No (comment)  Admission diagnosis:  Wheezing [R06.2] Abdominal pain, left lower quadrant [R10.32] Acute chest pain [R07.9] Right upper lobe pneumonia [J18.9] Community acquired pneumonia of right upper lobe of lung [J18.9] Patient Active Problem List   Diagnosis Date Noted   Right upper lobe pneumonia 06/01/2022   Hyperlipidemia 06/01/2022   S/P placement of VNS (vagus nerve stimulation) device 04/27/2022   Chronic migraine without aura with status migrainosus, not intractable 11/01/2020   Chronic iridocyclitis of left eye 02/07/2020   Adenomatous polyp of transverse colon 09/17/2019   BRBPR (bright red blood per rectum) 09/17/2019   Polyp of sigmoid colon    Migraine without aura and without status migrainosus, not intractable 02/22/2018   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   Hypokalemia 08/03/2013   Hypothyroidism 08/02/2013   Subtherapeutic international normalized ratio (INR) 08/02/2013   Contracture of ankle and foot joint 03/29/2013   Flexion contracture of ankle or foot joint 03/29/2013   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[place]) 12/21/2012   Partial epilepsy with intractable epilepsy ([place]) 12/21/2012   Obstructive sleep apnea (adult) (  pediatric) 12/21/2012   Acute venous embolism and thrombosis of subclavian veins ([place]) 12/21/2012   Acute venous embolism and thrombosis of internal jugular veins ([place]) 12/21/2012   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   Mechanical  complication of other vascular device, implant, and graft 12/21/2012   Acute embolism and thrombosis of unspecified vein 12/21/2012   Cavus deformity of foot, acquired 11/23/2012   Cavus deformity of foot 11/23/2012   Long term (current) use of anticoagulants 05/21/2012   Mitral regurgitation 04/29/2012   Atrial fibrillation ([place]) 04/08/2012   Status epilepticus ([place]) 04/08/2012   Irregular heart beat    DVT of upper extremity (deep vein thrombosis) ([place]) 09/04/2011   Brown's syndrome 11/13/2010   Allergic rhinitis 12/26/2009   Mitral valve disease 06/22/2007   PCP:  Chesley Noon, MD Pharmacy:   [facility], [address] [place] [address] Phone: [phone] Fax: [phone]  [id number] [id number]: [phone]: [phone]    Social Determinants of Health (SDOH) Interventions    Readmission Risk Interventions     No data to display

## 2022-06-03 DIAGNOSIS — J189 Pneumonia, unspecified organism: Secondary | ICD-10-CM | POA: Diagnosis not present

## 2022-06-03 LAB — BASIC METABOLIC PANEL
Anion gap: 7 (ref 5–15)
BUN: 5 mg/dL — ABNORMAL LOW (ref 6–20)
CO2: 22 mmol/L (ref 22–32)
Calcium: 8.1 mg/dL — ABNORMAL LOW (ref 8.9–10.3)
Chloride: 108 mmol/L (ref 98–111)
Creatinine, Ser: 0.67 mg/dL (ref 0.61–1.24)
GFR, Estimated: >60 mL/min (ref >60)
Glucose, Bld: 101 mg/dL — ABNORMAL HIGH (ref 70–99)
Potassium: 3.2 mmol/L — ABNORMAL LOW (ref 3.5–5.1)
Sodium: 137 mmol/L (ref 135–145)

## 2022-06-03 LAB — MAGNESIUM: Magnesium: 1.6 mg/dL — ABNORMAL LOW (ref 1.7–2.4)

## 2022-06-03 LAB — HIV ANTIBODY (ROUTINE TESTING W REFLEX): HIV Screen 4th Generation wRfx: NONREACTIVE

## 2022-06-03 MED ORDER — POTASSIUM CHLORIDE CRYS ER 20 MEQ PO TBCR
40.0000 meq | EXTENDED_RELEASE_TABLET | Freq: Two times a day (BID) | ORAL | Status: AC
  Administered 2022-06-03 (×2): 40 meq via ORAL
  Filled 2022-06-03 (×2): qty 2

## 2022-06-03 MED ORDER — MAGNESIUM SULFATE 2 GM/50ML IV SOLN
2.0000 g | Freq: Once | INTRAVENOUS | Status: AC
  Administered 2022-06-03: 2 g via INTRAVENOUS
  Filled 2022-06-03: qty 50

## 2022-06-03 NOTE — Evaluation (Signed)
Physical Therapy Evaluation Patient Details Name: [name] MRN: [id number] DOB: August 09, 1986 Today's Date: 06/03/2022  History of Present Illness  36 y.o. male with medical history significant of allergic rhinitis, Brown syndrome, growth disorder, migraine headaches, moderate mitral regurgitation/MVP, mild mental retardation, hyperlipidemia, hypokalemia, seizure disorder, sleep apnea on CPAP, hypothyroidism, PAF not on AC is coming to the emergency department with pleuritic chest pain, wheezing, dyspnea, productive cough of whitish sputum, fatigue, malaise, decreased appetite, low-grade fever and chills.  Imaging revealed right upper lobe consolidation  Clinical Impression  Pt admitted with above diagnosis. Pt ambulated 25' with RW with min to mod assist for balance. Hands on assist recommended when pt is out of bed due to high fall risk.  Pt currently with functional limitations due to the deficits listed below (see PT Problem List). Pt will benefit from skilled PT to increase their independence and safety with mobility to allow discharge to the venue listed below.          Recommendations for follow up therapy are one component of a multi-disciplinary discharge planning process, led by the attending physician.  Recommendations may be updated based on patient status, additional functional criteria and insurance authorization.  Follow Up Recommendations Home health PT      Assistance Recommended at Discharge Intermittent Supervision/Assistance  Patient can return home with the following  A little help with walking and/or transfers;A little help with bathing/dressing/bathroom;Assistance with cooking/housework;Direct supervision/assist for medications management;Assist for transportation;Help with stairs or ramp for entrance;Direct supervision/assist for financial management    Equipment Recommendations None recommended by PT  Recommendations for Other Services       Functional  Status Assessment Patient has had a recent decline in their functional status and demonstrates the ability to make significant improvements in function in a reasonable and predictable amount of time.     Precautions / Restrictions Precautions Precautions: Fall Precaution Comments: mother reports pt has occaissional falls Restrictions Weight Bearing Restrictions: No      Mobility  Bed Mobility Overal bed mobility: Needs Assistance Bed Mobility: Supine to Sit     Supine to sit: Min assist     General bed mobility comments: min a to raise trunk    Transfers Overall transfer level: Needs assistance Equipment used: Rolling walker (2 wheels) Transfers: Sit to/from Stand Sit to Stand: Mod assist           General transfer comment: min A to power up, mod A to balance in standing    Ambulation/Gait Ambulation/Gait assistance: Min assist Gait Distance (Feet): 90 Feet Assistive device: Rolling walker (2 wheels) Gait Pattern/deviations: Step-through pattern, Decreased stride length Gait velocity: decr     General Gait Details: intermittent min A for balance, no dyspnea  Stairs            Wheelchair Mobility    Modified Rankin (Stroke Patients Only)       Balance Overall balance assessment: Needs assistance, History of Falls Sitting-balance support: Feet supported, No upper extremity supported Sitting balance-Leahy Scale: Good     Standing balance support: Bilateral upper extremity supported, Reliant on assistive device for balance Standing balance-Leahy Scale: Poor Standing balance comment: min to mod A for balance for static and dynamic standing                             Pertinent Vitals/Pain Pain Assessment Pain Assessment: No/denies pain    Home Living Family/patient expects to be  discharged to:: Private residence Living Arrangements: Parent Available Help at Discharge: Family;Available 24 hours/day Type of Home: House Home Access:  Stairs to enter Entrance Stairs-Rails: Can reach both;Left;Right Entrance Stairs-Number of Steps: 2   Home Layout: One level Home Equipment: Wheelchair - Publishing copy (2 wheels)      Prior Function               Mobility Comments: walks with a R AFO, L ankle is fused; falls occaissionally, uses WC after seizures       Hand Dominance        Extremity/Trunk Assessment   Upper Extremity Assessment Upper Extremity Assessment: Generalized weakness    Lower Extremity Assessment Lower Extremity Assessment: Generalized weakness (pt wears R AFO, L ankle is fused per mother)    Cervical / Trunk Assessment Cervical / Trunk Assessment: Normal  Communication   Communication: Expressive difficulties (slurred speech)  Cognition Arousal/Alertness: Awake/alert Behavior During Therapy: WFL for tasks assessed/performed Overall Cognitive Status: History of cognitive impairments - at baseline                                          General Comments      Exercises     Assessment/Plan    PT Assessment Patient needs continued PT services  PT Problem List Decreased mobility;Decreased balance       PT Treatment Interventions Gait training;DME instruction;Balance training;Functional mobility training    PT Goals (Current goals can be found in the Care Plan section)  Acute Rehab PT Goals Patient Stated Goal: likes to go on errands with his mom and sister, and go to church PT Goal Formulation: With patient/family Time For Goal Achievement: 06/17/22 Potential to Achieve Goals: Good    Frequency Min 3X/week     Co-evaluation               AM-PAC PT "6 Clicks" Mobility  Outcome Measure Help needed turning from your back to your side while in a flat bed without using bedrails?: A Little Help needed moving from lying on your back to sitting on the side of a flat bed without using bedrails?: A Little Help needed moving to and from a bed to a  chair (including a wheelchair)?: A Little Help needed standing up from a chair using your arms (e.g., wheelchair or bedside chair)?: A Lot Help needed to walk in hospital room?: A Little Help needed climbing 3-5 steps with a railing? : A Lot 6 Click Score: 16    End of Session Equipment Utilized During Treatment: Gait belt Activity Tolerance: Patient tolerated treatment well;No increased pain Patient left: in chair;with call bell/phone within reach;with nursing/sitter in room;with family/visitor present Nurse Communication: Mobility status PT Visit Diagnosis: Difficulty in walking, not elsewhere classified (R26.2);Unsteadiness on feet (R26.81)    Time: 2876-8115 PT Time Calculation (min) (ACUTE ONLY): 17 min   Charges:   PT Evaluation $PT Eval Moderate Complexity: 1 Mod          Philomena Doheny PT 06/03/2022  Acute Rehabilitation Services  Office [phone]

## 2022-06-03 NOTE — Progress Notes (Signed)
PROGRESS NOTE    [name]  KWI:[id number] DOB: [birth year] DOA: 06/01/2022 PCP: Chesley Noon, MD     Brief Narrative:  [name] is a 36 y.o. male with medical history significant of allergic rhinitis, Brown syndrome, growth disorder, migraine headaches, moderate mitral regurgitation/MVP, mild mental retardation, hyperlipidemia, hypokalemia, seizure disorder, sleep apnea on CPAP, hypothyroidism, PAF not on AC is coming to the emergency department with pleuritic chest pain, wheezing, dyspnea, productive cough of whitish sputum, fatigue, malaise, decreased appetite, low-grade fever and chills.  Imaging revealed right upper lobe consolidation.  Patient was started on IV Rocephin and azithromycin and admitted to the hospital.  New events last 24 hours / Subjective: Mom at bedside, states yesterday was ok. He had no complaints of chest pain. Was able to keep food down yesterday.   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[place])   Mild intellectual disability   Hypothyroidism   Chronic migraine without aura with status migrainosus, not intractable   Hyperlipidemia   Right upper lobe CAP -COVID-negative -Strep pneumo antigen negative -Rocephin, azithromycin -Remains on room air   History of generalized epilepsy -Home meds include clobazam, Vimpat, Keppra, topiramate -Lorazepam and phenobarbital as needed for seizure  Hypothyroidism -Synthroid  Chronic migraines without aura -Carbamazepine  Hyperlipidemia -Crestor  Hypokalemia -Replace  Hypomagnesemia -Replace   DVT prophylaxis:  enoxaparin (LOVENOX) injection 40 mg Start: 06/01/22 2200  Code Status: Full code Family Communication: Mom at bedside Disposition Plan:  Status is: Inpatient Remains inpatient  appropriate because: IV antibiotics   Antimicrobials:  Anti-infectives (From admission, onward)    Start     Dose/Rate Route Frequency Ordered Stop   06/02/22 1200  cefTRIAXone (ROCEPHIN) 2 g in sodium chloride 0.9 % 100 mL IVPB        2 g 200 mL/hr over 30 Minutes Intravenous Every 24 hours 06/01/22 1612 06/07/22 1159   06/02/22 1200  azithromycin (ZITHROMAX) 500 mg in sodium chloride 0.9 % 250 mL IVPB        500 mg 250 mL/hr over 60 Minutes Intravenous Every 24 hours 06/01/22 1612 06/07/22 1159   06/01/22 1330  cefTRIAXone (ROCEPHIN) 1 g in sodium chloride 0.9 % 100 mL IVPB        1 g 200 mL/hr over 30 Minutes Intravenous  Once 06/01/22 1324 06/01/22 1511   06/01/22 1330  azithromycin (ZITHROMAX) 500 mg in sodium chloride 0.9 % 250 mL IVPB        500 mg 250 mL/hr over 60 Minutes Intravenous  Once 06/01/22 1324 06/01/22 1600        Objective: Vitals:   06/02/22 1732 06/02/22 1813 06/02/22 2027 06/03/22 0537  BP:   94/61 100/81  Pulse:   67 82  Resp:  18  16  Temp:  97.7 F (36.5 C) 97.7 F (36.5 C) 98.5 F (36.9 C)  TempSrc:  Axillary Oral Oral  SpO2: 100%  99% 100%    Intake/Output Summary (Last 24 hours) at 06/03/2022 1119 Last data filed at 06/03/2022 0900 Gross per 24 hour  Intake 1657.93 ml  Output 600 ml  Net 1057.93 ml    There were no vitals filed for this visit.  Examination:  General exam: Appears calm, on nasal CPAP  Respiratory system: Clear to auscultation anteriorly Cardiovascular system: S1 & S2 heard,  RRR. No murmurs. No pedal edema. Gastrointestinal system: Abdomen is nondistended, soft and nontender. Normal bowel sounds heard. Central nervous system: Alert  Extremities: Symmetric in appearance  Skin: No rashes, lesions or ulcers on exposed skin   Data Reviewed: I have personally reviewed following labs and imaging studies  CBC: Recent Labs  Lab 06/01/22 1051 06/02/22 0331  WBC 9.9 7.7  NEUTROABS 8.0*  --   HGB 12.0* 10.4*  HCT 36.2*  32.2*  MCV 85.8 89.4  PLT 375 735    Basic Metabolic Panel: Recent Labs  Lab 06/01/22 1051 06/02/22 0331 06/03/22 0356  NA 136 139 137  K 3.7 3.3* 3.2*  CL 103 110 108  CO2 '22 23 22  '$ GLUCOSE 136* 108* 101*  BUN 10 8 5*  CREATININE 0.92 0.86 0.67  CALCIUM 9.2 8.0* 8.1*  MG  --   --  1.6*    GFR: CrCl cannot be calculated (Unknown ideal weight.). Liver Function Tests: Recent Labs  Lab 06/01/22 1051 06/02/22 0331  AST 20 19  ALT 39 37  ALKPHOS 108 87  BILITOT 0.5 0.6  PROT 7.8 6.8  ALBUMIN 3.9 2.6*    No results for input(s): "LIPASE", "AMYLASE" in the last 168 hours. No results for input(s): "AMMONIA" in the last 168 hours. Coagulation Profile: Recent Labs  Lab 06/01/22 1324  INR 1.2    Cardiac Enzymes: No results for input(s): "CKTOTAL", "CKMB", "CKMBINDEX", "TROPONINI" in the last 168 hours. BNP (last 3 results) No results for input(s): "PROBNP" in the last 8760 hours. HbA1C: No results for input(s): "HGBA1C" in the last 72 hours. CBG: No results for input(s): "GLUCAP" in the last 168 hours. Lipid Profile: No results for input(s): "CHOL", "HDL", "LDLCALC", "TRIG", "CHOLHDL", "LDLDIRECT" in the last 72 hours. Thyroid Function Tests: No results for input(s): "TSH", "T4TOTAL", "FREET4", "T3FREE", "THYROIDAB" in the last 72 hours. Anemia Panel: No results for input(s): "VITAMINB12", "FOLATE", "FERRITIN", "TIBC", "IRON", "RETICCTPCT" in the last 72 hours. Sepsis Labs: Recent Labs  Lab 06/01/22 1324  LATICACIDVEN 0.4*     Recent Results (from the past 240 hour(s))  SARS Coronavirus 2 by RT PCR (hospital order, performed in [facility] hospital lab) *cepheid single result test* Anterior Nasal Swab     Status: None   Collection Time: 06/01/22 10:51 AM   Specimen: Anterior Nasal Swab  Result Value Ref Range Status   SARS Coronavirus 2 by RT PCR NEGATIVE NEGATIVE Final    Comment: (NOTE) SARS-CoV-2 target nucleic acids are NOT DETECTED.  The  SARS-CoV-2 RNA is generally detectable in upper and lower respiratory specimens during the acute phase of infection. The lowest concentration of SARS-CoV-2 viral copies this assay can detect is 250 copies / mL. A negative result does not preclude SARS-CoV-2 infection and should not be used as the sole basis for treatment or other patient management decisions.  A negative result may occur with improper specimen collection / handling, submission of specimen other than nasopharyngeal swab, presence of viral mutation(s) within the areas targeted by this assay, and inadequate number of viral copies (<250 copies / mL). A negative result must be combined with clinical observations, patient history, and epidemiological information.  Fact Sheet for Patients:   https://www.patel.info/  Fact Sheet for Healthcare Providers: https://hall.com/  This test is not yet approved or  cleared by the [place] FDA and has been authorized for detection and/or diagnosis of SARS-CoV-2 by FDA under an Emergency Use Authorization (EUA).  This EUA will remain in effect (meaning  this test can be used) for the duration of the COVID-19 declaration under Section 564(b)(1) of the Act, 21 U.S.C. section 360bbb-3(b)(1), unless the authorization is terminated or revoked sooner.  Performed at [facility], [address], [place], [place] [address]   Blood Culture (routine x 2)     Status: None (Preliminary result)   Collection Time: 06/01/22  1:24 PM   Specimen: BLOOD LEFT FOREARM  Result Value Ref Range Status   Specimen Description   Final    BLOOD LEFT FOREARM Performed at [facility], [address] [place] [employer] [place], [place] [address]    Special Requests   Final    BOTTLES DRAWN AEROBIC AND ANAEROBIC Blood Culture adequate volume Performed at [facility], [address], [place], [place] [address]    Culture    Final    NO GROWTH 2 DAYS Performed at [facility], [place] [address] [place], [place] [address]    Report Status PENDING  Incomplete  Urine Culture     Status: None   Collection Time: 06/01/22  1:24 PM   Specimen: In/Out Cath Urine  Result Value Ref Range Status   Specimen Description   Final    IN/OUT CATH URINE Performed at [facility], [address], [place], [place] [address]    Special Requests   Final    NONE Performed at [facility], [address], [place], [place] [address]    Culture   Final    NO GROWTH Performed at [facility], [place] [address], [place], [place] [address]    Report Status 06/02/2022 FINAL  Final      Radiology Studies: CT Angio Chest Pulmonary Embolism (PE) W or WO Contrast  Result Date: 06/01/2022 CLINICAL DATA:  Loss of appetite. Lower chest/epigastric area discomfort. Recent weight loss. EXAM: CT ANGIOGRAPHY CHEST CT ABDOMEN AND PELVIS WITH CONTRAST TECHNIQUE: Multidetector CT imaging of the chest was performed using the standard protocol during bolus administration of intravenous contrast. Multiplanar CT image reconstructions and MIPs were obtained to evaluate the vascular anatomy. Multidetector CT imaging of the abdomen and pelvis was performed using the standard protocol during bolus administration of intravenous contrast. RADIATION DOSE REDUCTION: This exam was performed according to the departmental dose-optimization program which includes automated exposure control, adjustment of the mA and/or kV according to patient size and/or use of iterative reconstruction technique. CONTRAST:  64m OMNIPAQUE IOHEXOL 350 MG/ML SOLN COMPARISON:  CT chest 09/01/11.  CT AP 09/12/2009. FINDINGS: CTA CHEST FINDINGS Cardiovascular: Diminished exam detail secondary to motion artifact. There is satisfactory opacification of the main pulmonary artery and its branches to the proximal segmental level. No signs of acute  pulmonary embolism. There is a left chest wall nerve root stimulator with lead extending into the left lower neck. Right chest wall port a catheter noted with tip in the distal SVC. Extensive the left chest wall collaterals are identified. Secondary to known extensive bilateral central venous occlusion. Mediastinum/Nodes: Thyroid gland, trachea, and esophagus are unremarkable. No enlarged axillary, mediastinal, or hilar lymph nodes. Lungs/Pleura: Small right pleural effusion. Airspace opacity within the right upper lobe is identified which is concerning for pneumonia. This area measures approximately 4.6 x 2.5 cm, image 51/6 Musculoskeletal: No acute findings. Review of the MIP images confirms the above findings. CT ABDOMEN and PELVIS FINDINGS Hepatobiliary: No focal liver abnormality is seen. No gallstones, gallbladder wall thickening, or biliary dilatation. Pancreas: Unremarkable. No pancreatic ductal dilatation or surrounding inflammatory changes.  Spleen: Normal in size without focal abnormality. Adrenals/Urinary Tract: Normal adrenal glands. No kidney mass, nephrolithiasis, or hydronephrosis. No signs of hydroureter or ureteral lithiasis. Bladder is unremarkable. Stomach/Bowel: Stomach appears normal. The appendix is visualized and appears normal. No bowel wall thickening, inflammation, or distension. Vascular/Lymphatic: Mild aortic atherosclerotic calcification. No aneurysm. No signs of abdominopelvic adenopathy. Reproductive: Prostate is unremarkable. Other: No free fluid or fluid collections.  No pneumoperitoneum. Musculoskeletal: No acute or significant osseous findings. Review of the MIP images confirms the above findings. IMPRESSION: 1. No evidence for acute pulmonary embolism. 2. Airspace opacity within the right upper lobe concerning for pneumonia. 3. Small right pleural effusion. 4. No evidence for acute intra-abdominal or pelvic abnormality. 5. Extensive chest wall collaterals compatible with known  chronic bilateral central venous occlusion. 6. Aortic Atherosclerosis (ICD10-I70.0). Electronically Signed   By: Kerby Moors M.D.   On: 06/01/2022 12:41   CT Renal Stone Study  Result Date: 06/01/2022 CLINICAL DATA:  Loss of appetite. Lower chest/epigastric area discomfort. Recent weight loss. EXAM: CT ANGIOGRAPHY CHEST CT ABDOMEN AND PELVIS WITH CONTRAST TECHNIQUE: Multidetector CT imaging of the chest was performed using the standard protocol during bolus administration of intravenous contrast. Multiplanar CT image reconstructions and MIPs were obtained to evaluate the vascular anatomy. Multidetector CT imaging of the abdomen and pelvis was performed using the standard protocol during bolus administration of intravenous contrast. RADIATION DOSE REDUCTION: This exam was performed according to the departmental dose-optimization program which includes automated exposure control, adjustment of the mA and/or kV according to patient size and/or use of iterative reconstruction technique. CONTRAST:  30m OMNIPAQUE IOHEXOL 350 MG/ML SOLN COMPARISON:  CT chest 09/01/11.  CT AP 09/12/2009. FINDINGS: CTA CHEST FINDINGS Cardiovascular: Diminished exam detail secondary to motion artifact. There is satisfactory opacification of the main pulmonary artery and its branches to the proximal segmental level. No signs of acute pulmonary embolism. There is a left chest wall nerve root stimulator with lead extending into the left lower neck. Right chest wall port a catheter noted with tip in the distal SVC. Extensive the left chest wall collaterals are identified. Secondary to known extensive bilateral central venous occlusion. Mediastinum/Nodes: Thyroid gland, trachea, and esophagus are unremarkable. No enlarged axillary, mediastinal, or hilar lymph nodes. Lungs/Pleura: Small right pleural effusion. Airspace opacity within the right upper lobe is identified which is concerning for pneumonia. This area measures approximately 4.6 x  2.5 cm, image 51/6 Musculoskeletal: No acute findings. Review of the MIP images confirms the above findings. CT ABDOMEN and PELVIS FINDINGS Hepatobiliary: No focal liver abnormality is seen. No gallstones, gallbladder wall thickening, or biliary dilatation. Pancreas: Unremarkable. No pancreatic ductal dilatation or surrounding inflammatory changes. Spleen: Normal in size without focal abnormality. Adrenals/Urinary Tract: Normal adrenal glands. No kidney mass, nephrolithiasis, or hydronephrosis. No signs of hydroureter or ureteral lithiasis. Bladder is unremarkable. Stomach/Bowel: Stomach appears normal. The appendix is visualized and appears normal. No bowel wall thickening, inflammation, or distension. Vascular/Lymphatic: Mild aortic atherosclerotic calcification. No aneurysm. No signs of abdominopelvic adenopathy. Reproductive: Prostate is unremarkable. Other: No free fluid or fluid collections.  No pneumoperitoneum. Musculoskeletal: No acute or significant osseous findings. Review of the MIP images confirms the above findings. IMPRESSION: 1. No evidence for acute pulmonary embolism. 2. Airspace opacity within the right upper lobe concerning for pneumonia. 3. Small right pleural effusion. 4. No evidence for acute intra-abdominal or pelvic abnormality. 5. Extensive chest wall collaterals compatible with known chronic bilateral central venous occlusion. 6. Aortic Atherosclerosis (ICD10-I70.0). Electronically Signed  By: Kerby Moors M.D.   On: 06/01/2022 12:41      Scheduled Meds:  carbamazepine  150 mg Oral TID   Chlorhexidine Gluconate Cloth  6 each Topical Daily   cloBAZam  10 mg Oral BID   cloBAZam  20 mg Oral QHS   enoxaparin (LOVENOX) injection  40 mg Subcutaneous Q24H   guaiFENesin  600 mg Oral BID   lacosamide  100 mg Oral Daily   lacosamide  200 mg Oral QHS   levETIRAcetam  1,000 mg Oral Daily   levETIRAcetam  1,250 mg Oral QHS   levothyroxine  50 mcg Oral QAC breakfast   montelukast  10  mg Oral QHS   potassium chloride SA  40 mEq Oral BID   rosuvastatin  10 mg Oral QHS   topiramate ER  200 mg Oral QHS   And   topiramate ER  50 mg Oral QHS   Continuous Infusions:  sodium chloride Stopped (06/03/22 0735)   azithromycin Stopped (06/02/22 1437)   cefTRIAXone (ROCEPHIN)  IV Stopped (06/02/22 1812)   lactated ringers 75 mL/hr at 06/02/22 2053     LOS: 2 days     Dessa Phi, DO [facility] 06/03/2022, 11:19 AM   Available via Epic secure chat 7am-7pm After these hours, please refer to coverage provider listed on [email or web address]

## 2022-06-03 NOTE — Progress Notes (Signed)
Pt's mother called stated the pt having a seizure. She stated the seizure lasted for 1 min. Vitals stable. Postictal state lasted 5 min. Provider notified. Provider performed assessment at the bedside.

## 2022-06-04 DIAGNOSIS — J189 Pneumonia, unspecified organism: Secondary | ICD-10-CM | POA: Diagnosis not present

## 2022-06-04 LAB — BASIC METABOLIC PANEL
Anion gap: 7 (ref 5–15)
BUN: 7 mg/dL (ref 6–20)
CO2: 22 mmol/L (ref 22–32)
Calcium: 8.2 mg/dL — ABNORMAL LOW (ref 8.9–10.3)
Chloride: 110 mmol/L (ref 98–111)
Creatinine, Ser: 0.74 mg/dL (ref 0.61–1.24)
GFR, Estimated: >60 mL/min (ref >60)
Glucose, Bld: 99 mg/dL (ref 70–99)
Potassium: 3.9 mmol/L (ref 3.5–5.1)
Sodium: 139 mmol/L (ref 135–145)

## 2022-06-04 LAB — CBC
HCT: 31.6 % — ABNORMAL LOW (ref 39.0–52.0)
Hemoglobin: 10.1 g/dL — ABNORMAL LOW (ref 13.0–17.0)
MCH: 28.6 pg (ref 26.0–34.0)
MCHC: 32 g/dL (ref 30.0–36.0)
MCV: 89.5 fL (ref 80.0–100.0)
Platelets: 338 10*3/uL (ref 150–400)
RBC: 3.53 MIL/uL — ABNORMAL LOW (ref 4.22–5.81)
RDW: 12.8 % (ref 11.5–15.5)
WBC: 7.7 10*3/uL (ref 4.0–10.5)
nRBC: 0 % (ref 0.0–0.2)

## 2022-06-04 LAB — MAGNESIUM: Magnesium: 2.1 mg/dL (ref 1.7–2.4)

## 2022-06-04 NOTE — Progress Notes (Signed)
    OVERNIGHT PROGRESS REPORT  Notified by RR RN and floor RN for family alerting to a possible seizure. Patient has a history of seizures and family is familiar with history and treatment (lifelong)  "36 y.o. male with medical history significant of allergic rhinitis, Brown syndrome, growth disorder, migraine headaches, moderate mitral regurgitation/MVP, mild mental retardation, hyperlipidemia, hypokalemia, seizure disorder, sleep apnea on CPAP, hypothyroidism, PAF not on AC."  On arrival at bedside patient was awake at baseline according to family member.  Patient acknowledges name and that he is "ok" when called/asked.   Seizure was reported as approximately 1 minute. No obvious postictal state.    Tegretol, Vimpat, and Keppra were given at that time.     Gershon Cull MSNA MSN ACNPC-AG Acute Care Nurse Practitioner [place]

## 2022-06-04 NOTE — Plan of Care (Signed)
  Problem: Education: Goal: Knowledge of General Education information will improve Description: Including pain rating scale, medication(s)/side effects and non-pharmacologic comfort measures Outcome: Completed/Met   Problem: Elimination: Goal: Will not experience complications related to bowel motility Outcome: Completed/Met Goal: Will not experience complications related to urinary retention Outcome: Completed/Met   Problem: Health Behavior/Discharge Planning: Goal: Ability to manage health-related needs will improve Outcome: Progressing   Problem: Clinical Measurements: Goal: Ability to maintain clinical measurements within normal limits will improve Outcome: Progressing Goal: Will remain free from infection Outcome: Progressing Goal: Diagnostic test results will improve Outcome: Progressing Goal: Respiratory complications will improve Outcome: Progressing Goal: Cardiovascular complication will be avoided Outcome: Progressing   Problem: Activity: Goal: Risk for activity intolerance will decrease Outcome: Progressing   Problem: Nutrition: Goal: Adequate nutrition will be maintained Outcome: Progressing   Problem: Coping: Goal: Level of anxiety will decrease Outcome: Progressing   Problem: Pain Managment: Goal: General experience of comfort will improve Outcome: Progressing   Problem: Safety: Goal: Ability to remain free from injury will improve Outcome: Progressing   Problem: Skin Integrity: Goal: Risk for impaired skin integrity will decrease Outcome: Progressing   Problem: Activity: Goal: Ability to tolerate increased activity will improve Outcome: Progressing   Problem: Clinical Measurements: Goal: Ability to maintain a body temperature in the normal range will improve Outcome: Progressing   Problem: Respiratory: Goal: Ability to maintain adequate ventilation will improve Outcome: Progressing Goal: Ability to maintain a clear airway will  improve Outcome: Progressing

## 2022-06-04 NOTE — Progress Notes (Signed)
PROGRESS NOTE    [name]  XNT:[id number] DOB: 12-07-1985 DOA: 06/01/2022 PCP: Chesley Noon, MD    Brief Narrative:  This 36 y.o. male with PMH significant for allergic rhinitis, Brown syndrome, growth disorder, migraine headaches, moderate mitral regurgitation / MVP, mild mental retardation, hyperlipidemia, hypokalemia, seizure disorder, sleep apnea on CPAP, hypothyroidism, PAF not on AC presented in the emergency department with pleuritic chest pain, wheezing, dyspnea, productive cough of whitish sputum, fatigue, malaise, decreased appetite, low-grade fever and chills.  Imaging revealed right upper lobe consolidation.  Patient was started on IV Rocephin and azithromycin and admitted to the hospital.Patient had an episode for seizure last night lasting 1 min.  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 Chest x-ray shows right upper lobe consolidation. COVID-negative, influenza negative Strep pneumo antigen negative Continue empiric antibiotic ceftriaxone and Zithromax Remains on room air not hypoxic.   History of generalized epilepsy: Continue Home meds include clobazam, Vimpat, Keppra, topiramate Continue Lorazepam and phenobarbital as needed for seizure   Hypothyroidism: Continue Synthroid 50 mcg daily   Chronic migraine without aura: Continue Topamax 250 mg daily   Hyperlipidemia: Continue Crestor 10 mg daily   Hypokalemia: Replaced and resolved  Hypomagnesemia: Replaced and resolved.   DVT prophylaxis: Lovenox Code  Status: Full code Family Communication: Mother at bed side Disposition Plan:    Status is: Inpatient Remains inpatient appropriate because: Admitted for right upper lobe pneumonia requiring IV antibiotics.  Patient had a seizure episode last night doing better overall.  Anticipated discharge home in 1 to 2 days.    Consultants:  None  Procedures: None  Antimicrobials: Anti-infectives (From admission, onward)    Start     Dose/Rate Route Frequency Ordered Stop   06/02/22 1200  cefTRIAXone (ROCEPHIN) 2 g in sodium chloride 0.9 % 100 mL IVPB        2 g 200 mL/hr over 30 Minutes Intravenous Every 24 hours 06/01/22 1612 06/07/22 1159   06/02/22 1200  azithromycin (ZITHROMAX) 500 mg in sodium chloride 0.9 % 250 mL IVPB        500 mg 250 mL/hr over 60 Minutes Intravenous Every 24 hours 06/01/22 1612 06/07/22 1159   06/01/22 1330  cefTRIAXone (ROCEPHIN) 1 g in sodium chloride 0.9 % 100 mL IVPB        1 g 200 mL/hr over 30 Minutes Intravenous  Once 06/01/22 1324 06/01/22 1511   06/01/22 1330  azithromycin (ZITHROMAX) 500 mg in sodium chloride 0.9 % 250 mL IVPB        500 mg 250 mL/hr over 60 Minutes Intravenous  Once 06/01/22 1324 06/01/22 1600      Subjective: Patient was seen and examined at bedside.  Overnight events noted. Mother at bedside stated he is doing much better than yesterday denies any chest pain.   Last night he had an episode of seizure lasted 1 minute , he was slightly confused afterwards  Objective: Vitals:   06/03/22 2129 06/03/22 2251 06/04/22 0416 06/04/22 1220  BP: 97/64 99/79 (!) 89/68   Pulse: 79 81 77   Resp: 20 20 20  Temp: 98.3 F (36.8 C)  98.3 F (36.8 C)   TempSrc: Oral  Oral   SpO2: 97% 100% 96%   Weight:    53.4 kg  Height:    '5\' 3"'$  (1.6 m)    Intake/Output Summary (Last 24 hours) at 06/04/2022 1315 Last data filed at 06/04/2022 1000 Gross per 24 hour  Intake 3270.98 ml  Output 400 ml  Net 2870.98 ml   Filed Weights   06/04/22 1220   Weight: 53.4 kg    Examination:  General exam: Appears comfortable, not in any acute distress.  Deconditioned Respiratory system: CTA bilaterally, no wheezing, no crackles, normal respiratory effort Cardiovascular system: S1 & S2 heard, regular rate and rhythm, murmur+ Gastrointestinal system: Abdomen is soft, non tender, non distended, BS + Central nervous system: Alert and oriented x 2. No focal neurological deficits. Extremities: No edema, no cyanosis, no clubbing. Skin: No rashes, lesions or ulcers Psychiatry: Judgement and insight appear normal. Mood & affect appropriate.     Data Reviewed: I have personally reviewed following labs and imaging studies  CBC: Recent Labs  Lab 06/01/22 1051 06/02/22 0331 06/04/22 0300  WBC 9.9 7.7 7.7  NEUTROABS 8.0*  --   --   HGB 12.0* 10.4* 10.1*  HCT 36.2* 32.2* 31.6*  MCV 85.8 89.4 89.5  PLT 375 319 384   Basic Metabolic Panel: Recent Labs  Lab 06/01/22 1051 06/02/22 0331 06/03/22 0356 06/04/22 0300  NA 136 139 137 139  K 3.7 3.3* 3.2* 3.9  CL 103 110 108 110  CO2 '22 23 22 22  '$ GLUCOSE 136* 108* 101* 99  BUN 10 8 5* 7  CREATININE 0.92 0.86 0.67 0.74  CALCIUM 9.2 8.0* 8.1* 8.2*  MG  --   --  1.6* 2.1   GFR: Estimated Creatinine Clearance: 98.3 mL/min (by C-G formula based on SCr of 0.74 mg/dL). Liver Function Tests: Recent Labs  Lab 06/01/22 1051 06/02/22 0331  AST 20 19  ALT 39 37  ALKPHOS 108 87  BILITOT 0.5 0.6  PROT 7.8 6.8  ALBUMIN 3.9 2.6*   No results for input(s): "LIPASE", "AMYLASE" in the last 168 hours. No results for input(s): "AMMONIA" in the last 168 hours. Coagulation Profile: Recent Labs  Lab 06/01/22 1324  INR 1.2   Cardiac Enzymes: No results for input(s): "CKTOTAL", "CKMB", "CKMBINDEX", "TROPONINI" in the last 168 hours. BNP (last 3 results) No results for input(s): "PROBNP" in the last 8760 hours. HbA1C: No results for input(s): "HGBA1C" in the last 72 hours. CBG: No results for  input(s): "GLUCAP" in the last 168 hours. Lipid Profile: No results for input(s): "CHOL", "HDL", "LDLCALC", "TRIG", "CHOLHDL", "LDLDIRECT" in the last 72 hours. Thyroid Function Tests: No results for input(s): "TSH", "T4TOTAL", "FREET4", "T3FREE", "THYROIDAB" in the last 72 hours. Anemia Panel: No results for input(s): "VITAMINB12", "FOLATE", "FERRITIN", "TIBC", "IRON", "RETICCTPCT" in the last 72 hours. Sepsis Labs: Recent Labs  Lab 06/01/22 1324  LATICACIDVEN 0.4*    Recent Results (from the past 240 hour(s))  SARS Coronavirus 2 by RT PCR (hospital order, performed in [facility] hospital lab) *cepheid single result test* Anterior Nasal Swab     Status: None   Collection Time: 06/01/22 10:51 AM   Specimen: Anterior Nasal Swab  Result Value Ref Range Status   SARS Coronavirus 2 by RT PCR NEGATIVE NEGATIVE Final    Comment: (NOTE) SARS-CoV-2 target nucleic acids are NOT DETECTED.  The SARS-CoV-2 RNA is generally detectable in upper and lower respiratory specimens  during the acute phase of infection. The lowest concentration of SARS-CoV-2 viral copies this assay can detect is 250 copies / mL. A negative result does not preclude SARS-CoV-2 infection and should not be used as the sole basis for treatment or other patient management decisions.  A negative result may occur with improper specimen collection / handling, submission of specimen other than nasopharyngeal swab, presence of viral mutation(s) within the areas targeted by this assay, and inadequate number of viral copies (<250 copies / mL). A negative result must be combined with clinical observations, patient history, and epidemiological information.  Fact Sheet for Patients:   https://www.patel.info/  Fact Sheet for Healthcare Providers: https://hall.com/  This test is not yet approved or  cleared by the [place] FDA and has been authorized for detection and/or diagnosis of  SARS-CoV-2 by FDA under an Emergency Use Authorization (EUA).  This EUA will remain in effect (meaning this test can be used) for the duration of the COVID-19 declaration under Section 564(b)(1) of the Act, 21 U.S.C. section 360bbb-3(b)(1), unless the authorization is terminated or revoked sooner.  Performed at [facility], [address] [place], [place] [address]   Blood Culture (routine x 2)     Status: None (Preliminary result)   Collection Time: 06/01/22  1:24 PM   Specimen: BLOOD LEFT FOREARM  Result Value Ref Range Status   Specimen Description   Final    BLOOD LEFT FOREARM Performed at [facility], [address], [place], [place] [address]    Special Requests   Final    BOTTLES DRAWN AEROBIC AND ANAEROBIC Blood Culture adequate volume Performed at [facility], [address], [place], [place] [address]    Culture   Final    NO GROWTH 3 DAYS Performed at [facility], [place] [address] [place], [place] [address]    Report Status PENDING  Incomplete  Urine Culture     Status: None   Collection Time: 06/01/22  1:24 PM   Specimen: In/Out Cath Urine  Result Value Ref Range Status   Specimen Description   Final    IN/OUT CATH URINE Performed at [facility], [address], [place], [place] [address]    Special Requests   Final    NONE Performed at [facility], [address] [place], [place] [address]    Culture   Final    NO GROWTH Performed at [facility], [place] [address] [place], [place] [address]    Report Status 06/02/2022 FINAL  Final    Radiology Studies: No results found.  Scheduled Meds:  carbamazepine  150 mg Oral TID   Chlorhexidine Gluconate Cloth  6 each Topical Daily   cloBAZam  10 mg Oral BID   cloBAZam  20 mg Oral QHS   enoxaparin (LOVENOX) injection  40 mg Subcutaneous Q24H   guaiFENesin  600 mg Oral BID   lacosamide  100 mg  Oral Daily   lacosamide  200 mg Oral QHS   levETIRAcetam  1,000 mg Oral Daily   levETIRAcetam  1,250 mg Oral QHS   levothyroxine  50 mcg Oral QAC breakfast   montelukast  10 mg Oral QHS   rosuvastatin  10 mg Oral QHS   topiramate ER  200 mg Oral QHS   And   topiramate ER  50 mg Oral QHS   Continuous Infusions:  sodium chloride Stopped (06/03/22 0735)   azithromycin 500 mg (06/04/22 1049)   cefTRIAXone (  ROCEPHIN)  IV 2 g (06/03/22 1209)   lactated ringers 75 mL/hr at 06/04/22 1024     LOS: 3 days    Time spent: 50 mins    Zykeriah Mathia, MD [facility]   If 7PM-7AM, please contact night-coverage

## 2022-06-04 NOTE — TOC Initial Note (Signed)
Transition of Care [facility]) - Initial/Assessment Note    Patient Details  Name: [name] MRN: [id number] Date of Birth: 11/03/1985  [facility]) CM/SW Contact:    Rodney Booze, LCSW Phone Number: 06/04/2022, 11:26 AM  Clinical Narrative:                  CSW spoke to Pt and family member they have stated they are good and have services set up for this Pt. They have no needs for TOC.  Expected Discharge Plan: Home/Self Care Barriers to Discharge: No Barriers Identified   Patient Goals and CMS Choice Patient states their goals for this hospitalization and ongoing recovery are:: Pt is going home with family requires no needs. CMS Medicare.gov Compare Post Acute Care list provided to:: Patient Choice offered to / list presented to : Patient  Expected Discharge Plan and Services Expected Discharge Plan: Home/Self Care   Discharge Planning Services: CM Consult   Living arrangements for the past 2 months: Single Family Home                                      Prior Living Arrangements/Services Living arrangements for the past 2 months: Single Family Home Lives with:: Relatives Patient language and need for interpreter reviewed:: Yes Do you feel safe going back to the place where you live?: Yes      Need for Family Participation in Patient Care: Yes (Comment) Care giver support system in place?: Yes (comment) Current home services: DME (oxygen concentrator & portable tanks (from [place]), manual & motorized wheel chairs, cpap (from [place]); caseworker from [place]) Criminal Activity/Legal Involvement Pertinent to Current Situation/Hospitalization: No - Comment as needed  Activities of Daily Living Home Assistive Devices/Equipment: Environmental consultant (specify type) ADL Screening (condition at time of admission) Patient's cognitive ability adequate to safely complete daily activities?: No Is the patient deaf or have difficulty hearing?: No Does  the patient have difficulty seeing, even when wearing glasses/contacts?: No Does the patient have difficulty concentrating, remembering, or making decisions?: No Patient able to express need for assistance with ADLs?: Yes Does the patient have difficulty dressing or bathing?: Yes Independently performs ADLs?: No Does the patient have difficulty walking or climbing stairs?: Yes Weakness of Legs: Both Weakness of Arms/Hands: None  Permission Sought/Granted Permission sought to share information with : Case Manager Permission granted to share information with : Yes, Verbal Permission Granted              Emotional Assessment Appearance:: Appears stated age Attitude/Demeanor/Rapport: Gracious Affect (typically observed): Accepting Orientation: : Oriented to Self, Oriented to Place, Oriented to  Time, Oriented to Situation Alcohol / Substance Use: Not Applicable Psych Involvement: No (comment)  Admission diagnosis:  Wheezing [R06.2] Abdominal pain, left lower quadrant [R10.32] Acute chest pain [R07.9] Right upper lobe pneumonia [J18.9] Community acquired pneumonia of right upper lobe of lung [J18.9] Patient Active Problem List   Diagnosis Date Noted   Right upper lobe pneumonia 06/01/2022   Hyperlipidemia 06/01/2022   S/P placement of VNS (vagus nerve stimulation) device 04/27/2022   Chronic migraine without aura with status migrainosus, not intractable 11/01/2020   Chronic iridocyclitis of left eye 02/07/2020   Adenomatous polyp of transverse colon 09/17/2019   BRBPR (bright red blood per rectum) 09/17/2019   Polyp of sigmoid colon    Migraine without aura and without status migrainosus, not intractable  02/22/2018   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   Hypokalemia 08/03/2013   Hypothyroidism 08/02/2013   Subtherapeutic international normalized ratio (INR) 08/02/2013   Contracture of ankle and foot joint 03/29/2013   Flexion contracture of ankle or foot joint 03/29/2013    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[place]) 12/21/2012   Partial epilepsy with intractable epilepsy ([place]) 12/21/2012   Obstructive sleep apnea (adult) (pediatric) 12/21/2012   Acute venous embolism and thrombosis of subclavian veins ([place]) 12/21/2012   Acute venous embolism and thrombosis of internal jugular veins ([place]) 12/21/2012   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   Mechanical complication of other vascular device, implant, and graft 12/21/2012   Acute embolism and thrombosis of unspecified vein 12/21/2012   Cavus deformity of foot, acquired 11/23/2012   Cavus deformity of foot 11/23/2012   Long term (current) use of anticoagulants 05/21/2012   Mitral regurgitation 04/29/2012   Atrial fibrillation ([place]) 04/08/2012   Status epilepticus ([place]) 04/08/2012   Irregular heart beat    DVT of upper extremity (deep vein thrombosis) ([place]) 09/04/2011   Brown's syndrome 11/13/2010   Allergic rhinitis 12/26/2009   Mitral valve disease 06/22/2007   PCP:  Chesley Noon, MD Pharmacy:   [name], [address] [place] [address] Phone: [phone] Fax: [phone]  [id number] [id number]: [phone]: [phone]    Social Determinants of Health (SDOH) Interventions    Readmission Risk Interventions     No data to display

## 2022-06-04 NOTE — Evaluation (Signed)
Occupational Therapy Evaluation Patient Details Name: [name] MRN: [id number] DOB: November 27, 1985 Today's Date: 06/04/2022   History of Present Illness 36 y.o. male with medical history significant of allergic rhinitis, Brown syndrome, growth disorder, migraine headaches, moderate mitral regurgitation/MVP, mild mental retardation, hyperlipidemia, hypokalemia, seizure disorder, sleep apnea on CPAP, hypothyroidism, PAF not on AC. Admitted with pneumonia.   Clinical Impression   [name] is a 36 year old man who presents with above medical history as well as impaired balance and generalized weakness. At baseline he ambulates in home holding onto walls and furniture with a history of falls. He is typically able to grossly don loose elastic waisted clothing but needing assistance for fasteners, bathing and toileting. On evaluation he presents near baseline in regards to ADLs - needing more assistance for feeding due to type of food and not having his built up handles. He ambulated with walker with min guard and min assist for walker management. He needed  verbal cues to manage negotiation around barriers in hallway as he has a tendency to look up and not forward. His mother reports this is his typical. From an OT standpoint patient has no OT needs.      Recommendations for follow up therapy are one component of a multi-disciplinary discharge planning process, led by the attending physician.  Recommendations may be updated based on patient status, additional functional criteria and insurance authorization.   Follow Up Recommendations  No OT follow up    Assistance Recommended at Discharge Frequent or constant Supervision/Assistance  Patient can return home with the following A little help with walking and/or transfers;A lot of help with bathing/dressing/bathroom;Assistance with cooking/housework;Direct supervision/assist for financial management;Assist for transportation;Help with  stairs or ramp for entrance;Direct supervision/assist for medications management    Functional Status Assessment  Patient has not had a recent decline in their functional status  Equipment Recommendations  None recommended by OT    Recommendations for Other Services       Precautions / Restrictions Precautions Precautions: Fall Precaution Comments: mother reports pt has occaissional falls Restrictions Weight Bearing Restrictions: No      Mobility Bed Mobility Overal bed mobility: Needs Assistance Bed Mobility: Supine to Sit     Supine to sit: Supervision          Transfers Overall transfer level: Needs assistance Equipment used: Rolling walker (2 wheels) Transfers: Sit to/from Stand Sit to Stand: Min guard           General transfer comment: Min guard to stand and ambulate. No overt loss of balance but min assist required for walker management.      Balance Overall balance assessment: History of Falls, Needs assistance                                         ADL either performed or assessed with clinical judgement   ADL Overall ADL's : At baseline                                             Vision Patient Visual Report: No change from baseline (Hx of Brown syndrome and cataract surgery)       Perception     Praxis      Pertinent Vitals/Pain Pain Assessment Pain Assessment:  No/denies pain     Hand Dominance     Extremity/Trunk Assessment Upper Extremity Assessment Upper Extremity Assessment: Overall WFL for tasks assessed   Lower Extremity Assessment Lower Extremity Assessment: Defer to PT evaluation   Cervical / Trunk Assessment Cervical / Trunk Assessment: Normal   Communication Communication Communication: Expressive difficulties (slurred speech)   Cognition Arousal/Alertness: Awake/alert Behavior During Therapy: WFL for tasks assessed/performed Overall Cognitive Status: History of cognitive  impairments - at baseline                                       General Comments       Exercises     Shoulder Instructions      Home Living Family/patient expects to be discharged to:: Private residence Living Arrangements: Parent Available Help at Discharge: Family;Available 24 hours/day Type of Home: House Home Access: Stairs to enter [employer] of Steps: 2 Entrance Stairs-Rails: Can reach both;Left;Right Home Layout: One level     Bathroom Shower/Tub: Occupational psychologist: Standard     Home Equipment: Wheelchair - Publishing copy (2 wheels);Grab bars - toilet;Electric scooter;Shower seat - built in;Grab bars - tub/shower          Prior Functioning/Environment               Mobility Comments: walks with a R AFO, L ankle is fused; falls occaissionally, uses WC after seizures ADLs Comments: assistance for ADLs. Can grossly dress himself but needs assistance for fasteners. Needs assistance for wiping with toileting. uses built up handles at home.        OT Problem List: Impaired balance (sitting and/or standing);Decreased cognition;Decreased coordination;Impaired sensation      OT Treatment/Interventions:      OT Goals(Current goals can be found in the care plan section) Acute Rehab OT Goals OT Goal Formulation: All assessment and education complete, DC therapy  OT Frequency:      Co-evaluation              AM-PAC OT "6 Clicks" Daily Activity     Outcome Measure Help from another person eating meals?: A Lot Help from another person taking care of personal grooming?: A Lot Help from another person toileting, which includes using toliet, bedpan, or urinal?: A Lot Help from another person bathing (including washing, rinsing, drying)?: A Lot Help from another person to put on and taking off regular upper body clothing?: A Little Help from another person to put on and taking off regular lower body clothing?:  A Little 6 Click Score: 14   End of Session Equipment Utilized During Treatment: Gait belt;Rolling walker (2 wheels) Nurse Communication: Mobility status  Activity Tolerance: Patient tolerated treatment well Patient left: in chair;with call bell/phone within reach;with family/visitor present  OT Visit Diagnosis: Unsteadiness on feet (R26.81);Muscle weakness (generalized) (M62.81)                Time: 9622-2979 OT Time Calculation (min): 20 min Charges:  OT General Charges $OT Visit: 1 Visit OT Evaluation $OT Eval Low Complexity: 1 Low  Gustavo Lah, OTR/L [place]  Office [phone]   Lenward Chancellor 06/04/2022, 12:01 PM

## 2022-06-05 DIAGNOSIS — J189 Pneumonia, unspecified organism: Secondary | ICD-10-CM | POA: Diagnosis not present

## 2022-06-05 LAB — GLUCOSE, CAPILLARY: Glucose-Capillary: 106 mg/dL — ABNORMAL HIGH (ref 70–99)

## 2022-06-05 LAB — CBC
HCT: 33.2 % — ABNORMAL LOW (ref 39.0–52.0)
Hemoglobin: 10.6 g/dL — ABNORMAL LOW (ref 13.0–17.0)
MCH: 28.5 pg (ref 26.0–34.0)
MCHC: 31.9 g/dL (ref 30.0–36.0)
MCV: 89.2 fL (ref 80.0–100.0)
Platelets: 465 10*3/uL — ABNORMAL HIGH (ref 150–400)
RBC: 3.72 MIL/uL — ABNORMAL LOW (ref 4.22–5.81)
RDW: 12.6 % (ref 11.5–15.5)
WBC: 6.3 10*3/uL (ref 4.0–10.5)
nRBC: 0 % (ref 0.0–0.2)

## 2022-06-05 MED ORDER — HEPARIN SOD (PORK) LOCK FLUSH 100 UNIT/ML IV SOLN
500.0000 [IU] | INTRAVENOUS | Status: AC | PRN
  Administered 2022-06-05: 500 [IU]

## 2022-06-05 MED ORDER — CEFDINIR 300 MG PO CAPS: 300.0000 mg | ORAL_CAPSULE | Freq: Two times a day (BID) | ORAL | 0 refills | Status: AC

## 2022-06-05 NOTE — Discharge Instructions (Signed)
Advised to follow-up with primary care physician in 1 week. Advised to take Omnicef 300 mg twice daily for 2 more days for CAP. Advised to continue seizure medications as prescribed. Advised to follow-up with neurology as scheduled

## 2022-06-05 NOTE — Discharge Summary (Signed)
Physician Discharge Summary  [name] JIR:[id number] DOB: [birth year] DOA: 06/01/2022  PCP: Chesley Noon, MD  Admit date: 06/01/2022  Discharge date: 06/05/2022  Admitted From: Home. Disposition: Home Health services.  Recommendations for Outpatient Follow-up:  Follow up with PCP in 1-2 weeks. Please obtain BMP/CBC in one week. Advised to take Omnicef 300 mg twice daily for 2 more days for CAP. Advised to continue seizure medications as prescribed. Advised to follow-up with Neurology as scheduled  Home Health:Home PT/OT Equipment/Devices:None  Discharge Condition: Stable CODE STATUS:Full code Diet recommendation: Heart Healthy  Brief [facility] Course: This 36 y.o. male with PMH significant for allergic rhinitis, Brown syndrome, growth disorder, migraine headaches, moderate mitral regurgitation / MVP, mild mental retardation, hyperlipidemia, hypokalemia, seizure disorder, sleep apnea on CPAP, hypothyroidism, PAF not on AC presented in the emergency department with pleuritic chest pain, wheezing, dyspnea, productive cough of whitish sputum, fatigue, malaise, decreased appetite, low-grade fever and chills.  Imaging revealed right upper lobe consolidation.  Patient was started on IV Rocephin and azithromycin and admitted to the hospital.  Patient continued on IV antibiotics, IV hydration, continued on home medications. Patient had an episode for seizure lasting less than 1 minutes during hospitalization.  Continued on seizure medications.  Patient has felt significantly improved.  Patient has received 4 days of IV antibiotics.  Patient is being discharged home on 2 more days of antibiotics Omnicef.  Home health services arranged.  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 Chest x-ray shows right upper lobe consolidation. COVID-negative, influenza negative Strep pneumo antigen negative Continue empiric antibiotic ceftriaxone and Zithromax Remains on room air not hypoxic. Patient is being discharged home on Omnicef 300 mg twice daily for 2 more days.   History of generalized epilepsy: Continue Home meds include clobazam, Vimpat, Keppra, topiramate Continue Lorazepam and phenobarbital as needed for seizure   Hypothyroidism: Continue Synthroid 50 mcg daily   Chronic migraine without aura: Continue Topamax 250 mg daily   Hyperlipidemia: Continue Crestor 10 mg daily   Hypokalemia: Replaced and resolved   Hypomagnesemia: Replaced and resolved.    Discharge Instructions  Discharge Instructions     Call MD for:  difficulty breathing, headache or visual disturbances   Complete by: As directed    Call MD for:  persistant dizziness or light-headedness   Complete by: As directed    Call MD for:  persistant nausea and vomiting   Complete by: As directed    Diet - low sodium heart healthy   Complete by: As directed    Diet Carb Modified   Complete by: As directed    Discharge instructions   Complete by: As directed    Advised to follow-up with primary care physician in 1 week. Advised to take Omnicef 300 mg twice daily for 2 more days for CAP. Advised to continue seizure medications as prescribed. Advised to follow-up with neurology as scheduled   Increase activity slowly   Complete by: As directed       Allergies as of 06/05/2022       Reactions   Antihistamines, Chlorpheniramine-type Other (See Comments)  Other Reaction: Dimetapp causes seizures   Divalproex Sodium Other (See Comments)   Nausea,  vomiting, liver and kidney dysfunction   Phenytoin Rash, Other (See Comments)   Dilantin causes more seizures.   Valproic Acid And Related Other (See Comments)   Shuts down systems   Vancomycin Rash, Other (See Comments), Swelling   If given too fast, causes red man reaction Other Reaction: Redman's Syndrome        Medication List     STOP taking these medications    ondansetron 4 MG disintegrating tablet Commonly known as: ZOFRAN-ODT       TAKE these medications    acetaminophen 500 MG tablet Commonly known as: TYLENOL Take 1,000 mg by mouth every 6 (six) hours as needed for headache.   albuterol 1.25 MG/3ML nebulizer solution Commonly known as: ACCUNEB Take 1 ampule by nebulization every 4 (four) hours as needed for wheezing.   carbamazepine 100 MG chewable tablet Commonly known as: TEGRETOL CHEW 2 TABLETS IN THE MORNING, 2 TABLETS AT MIDDAY AND 1 & 1/2 TABLETS IN THE EVENING What changed: additional instructions   cefdinir 300 MG capsule Commonly known as: OMNICEF Take 1 capsule (300 mg total) by mouth 2 (two) times daily for 2 days.   Fish Oil 500 MG Caps Take by mouth.   Flaxseed Oil 1000 MG Caps Take 1,000 mg by mouth daily.   K-Phos 500 MG tablet Generic drug: potassium phosphate (monobasic) Take 1 tablet (500 mg total) by mouth 4 (four) times daily.   Klor-Con M20 20 MEQ tablet Generic drug: potassium chloride SA TAKE 1 TABLET BY MOUTH EVERY DAY   levETIRAcetam 500 MG tablet Commonly known as: KEPPRA Take 1,000-1,250 mg by mouth in the morning and at bedtime. 2 tabs in the morning & 2.5 tabs at night   levothyroxine 50 MCG tablet Commonly known as: SYNTHROID Take 50 mcg by mouth daily before breakfast.   LORazepam 2 MG/ML injection Commonly known as: ATIVAN Inject 1 mL (2 mg total) into the vein See admin instructions. Dilute '2mg'$  of lorazepam with 46m of saline.  Administer IV push over 3-5 minutes as needed for seizures.   montelukast 10 MG  tablet Commonly known as: SINGULAIR Take 10 mg by mouth at bedtime.   multivitamin with minerals Tabs tablet Take 1 tablet by mouth daily.   Omega-3 500 MG Caps Take 500 mg by mouth daily.   [name] 10 MG tablet Generic drug: cloBAZam TAKE 1 TABLET BY MOUTH IN THE MORNING, 1 IN THE AFTERNOON AND 2 AT BEDTIME What changed:  how much to take how to take this when to take this additional instructions   PHENObarbital 65 MG/ML injection Commonly known as: LUMINAL Withdraw 198m('65mg'$ ) Phenobarbital and 36m98mormal Saline into 5ml59mringe. Adminster IV push for 3-5 minutes as needed for recurrent seizures   promethazine 25 MG suppository Commonly known as: PHENERGAN Place 25 mg rectally every 6 (six) hours as needed for nausea or vomiting.   rosuvastatin 10 MG tablet Commonly known as: CRESTOR Take 10 mg by mouth at bedtime.   Trokendi XR 50 MG Cp24 Generic drug: Topiramate ER Take 1 capsule at nighttime   Trokendi XR 200 MG Cp24 Generic drug: Topiramate ER Take 1 capsule by mouth at bedtime.   Vimpat 100 MG Tabs Generic drug: Lacosamide TAKE 1 TABLET IN THE MORNING AND TAKE 2 TABLETS AT NIGHT What changed:  how much to take how to take this when to take this additional instructions  Follow-up Information     Chesley Noon, MD Follow up in 1 week(s).   Specialty: Family Medicine Contact information: [place] [address] [phone]         Burnell Blanks, MD .   Specialty: Cardiology Contact information: [place] [address] [place] [address] [phone]                Allergies  Allergen Reactions   Antihistamines, Chlorpheniramine-Type Other (See Comments)    Other Reaction: Dimetapp causes seizures   Divalproex Sodium Other (See Comments)    Nausea, vomiting, liver and kidney dysfunction   Phenytoin Rash and Other (See Comments)    Dilantin causes more seizures.    Valproic Acid And  Related Other (See Comments)    Shuts down systems   Vancomycin Rash, Other (See Comments) and Swelling    If given too fast, causes red man reaction Other Reaction: Redman's Syndrome    Consultations: None   Procedures/Studies: CT Angio Chest Pulmonary Embolism (PE) W or WO Contrast  Result Date: 06/01/2022 CLINICAL DATA:  Loss of appetite. Lower chest/epigastric area discomfort. Recent weight loss. EXAM: CT ANGIOGRAPHY CHEST CT ABDOMEN AND PELVIS WITH CONTRAST TECHNIQUE: Multidetector CT imaging of the chest was performed using the standard protocol during bolus administration of intravenous contrast. Multiplanar CT image reconstructions and MIPs were obtained to evaluate the vascular anatomy. Multidetector CT imaging of the abdomen and pelvis was performed using the standard protocol during bolus administration of intravenous contrast. RADIATION DOSE REDUCTION: This exam was performed according to the departmental dose-optimization program which includes automated exposure control, adjustment of the mA and/or kV according to patient size and/or use of iterative reconstruction technique. CONTRAST:  41m OMNIPAQUE IOHEXOL 350 MG/ML SOLN COMPARISON:  CT chest 09/01/11.  CT AP 09/12/2009. FINDINGS: CTA CHEST FINDINGS Cardiovascular: Diminished exam detail secondary to motion artifact. There is satisfactory opacification of the main pulmonary artery and its branches to the proximal segmental level. No signs of acute pulmonary embolism. There is a left chest wall nerve root stimulator with lead extending into the left lower neck. Right chest wall port a catheter noted with tip in the distal SVC. Extensive the left chest wall collaterals are identified. Secondary to known extensive bilateral central venous occlusion. Mediastinum/Nodes: Thyroid gland, trachea, and esophagus are unremarkable. No enlarged axillary, mediastinal, or hilar lymph nodes. Lungs/Pleura: Small right pleural effusion. Airspace opacity  within the right upper lobe is identified which is concerning for pneumonia. This area measures approximately 4.6 x 2.5 cm, image 51/6 Musculoskeletal: No acute findings. Review of the MIP images confirms the above findings. CT ABDOMEN and PELVIS FINDINGS Hepatobiliary: No focal liver abnormality is seen. No gallstones, gallbladder wall thickening, or biliary dilatation. Pancreas: Unremarkable. No pancreatic ductal dilatation or surrounding inflammatory changes. Spleen: Normal in size without focal abnormality. Adrenals/Urinary Tract: Normal adrenal glands. No kidney mass, nephrolithiasis, or hydronephrosis. No signs of hydroureter or ureteral lithiasis. Bladder is unremarkable. Stomach/Bowel: Stomach appears normal. The appendix is visualized and appears normal. No bowel wall thickening, inflammation, or distension. Vascular/Lymphatic: Mild aortic atherosclerotic calcification. No aneurysm. No signs of abdominopelvic adenopathy. Reproductive: Prostate is unremarkable. Other: No free fluid or fluid collections.  No pneumoperitoneum. Musculoskeletal: No acute or significant osseous findings. Review of the MIP images confirms the above findings. IMPRESSION: 1. No evidence for acute pulmonary embolism. 2. Airspace opacity within the right upper lobe concerning for pneumonia. 3. Small right pleural effusion. 4. No evidence for acute  intra-abdominal or pelvic abnormality. 5. Extensive chest wall collaterals compatible with known chronic bilateral central venous occlusion. 6. Aortic Atherosclerosis (ICD10-I70.0). Electronically Signed   By: Kerby Moors M.D.   On: 06/01/2022 12:41   CT Renal Stone Study  Result Date: 06/01/2022 CLINICAL DATA:  Loss of appetite. Lower chest/epigastric area discomfort. Recent weight loss. EXAM: CT ANGIOGRAPHY CHEST CT ABDOMEN AND PELVIS WITH CONTRAST TECHNIQUE: Multidetector CT imaging of the chest was performed using the standard protocol during bolus administration of intravenous  contrast. Multiplanar CT image reconstructions and MIPs were obtained to evaluate the vascular anatomy. Multidetector CT imaging of the abdomen and pelvis was performed using the standard protocol during bolus administration of intravenous contrast. RADIATION DOSE REDUCTION: This exam was performed according to the departmental dose-optimization program which includes automated exposure control, adjustment of the mA and/or kV according to patient size and/or use of iterative reconstruction technique. CONTRAST:  49m OMNIPAQUE IOHEXOL 350 MG/ML SOLN COMPARISON:  CT chest 09/01/11.  CT AP 09/12/2009. FINDINGS: CTA CHEST FINDINGS Cardiovascular: Diminished exam detail secondary to motion artifact. There is satisfactory opacification of the main pulmonary artery and its branches to the proximal segmental level. No signs of acute pulmonary embolism. There is a left chest wall nerve root stimulator with lead extending into the left lower neck. Right chest wall port a catheter noted with tip in the distal SVC. Extensive the left chest wall collaterals are identified. Secondary to known extensive bilateral central venous occlusion. Mediastinum/Nodes: Thyroid gland, trachea, and esophagus are unremarkable. No enlarged axillary, mediastinal, or hilar lymph nodes. Lungs/Pleura: Small right pleural effusion. Airspace opacity within the right upper lobe is identified which is concerning for pneumonia. This area measures approximately 4.6 x 2.5 cm, image 51/6 Musculoskeletal: No acute findings. Review of the MIP images confirms the above findings. CT ABDOMEN and PELVIS FINDINGS Hepatobiliary: No focal liver abnormality is seen. No gallstones, gallbladder wall thickening, or biliary dilatation. Pancreas: Unremarkable. No pancreatic ductal dilatation or surrounding inflammatory changes. Spleen: Normal in size without focal abnormality. Adrenals/Urinary Tract: Normal adrenal glands. No kidney mass, nephrolithiasis, or  hydronephrosis. No signs of hydroureter or ureteral lithiasis. Bladder is unremarkable. Stomach/Bowel: Stomach appears normal. The appendix is visualized and appears normal. No bowel wall thickening, inflammation, or distension. Vascular/Lymphatic: Mild aortic atherosclerotic calcification. No aneurysm. No signs of abdominopelvic adenopathy. Reproductive: Prostate is unremarkable. Other: No free fluid or fluid collections.  No pneumoperitoneum. Musculoskeletal: No acute or significant osseous findings. Review of the MIP images confirms the above findings. IMPRESSION: 1. No evidence for acute pulmonary embolism. 2. Airspace opacity within the right upper lobe concerning for pneumonia. 3. Small right pleural effusion. 4. No evidence for acute intra-abdominal or pelvic abnormality. 5. Extensive chest wall collaterals compatible with known chronic bilateral central venous occlusion. 6. Aortic Atherosclerosis (ICD10-I70.0). Electronically Signed   By: TKerby MoorsM.D.   On: 06/01/2022 12:41   DG Chest 2 View  Result Date: 06/01/2022 CLINICAL DATA:  Chest pain and shortness of breath. EXAM: CHEST - 2 VIEW COMPARISON:  12/06/2019 FINDINGS: Right subclavian [name] unchanged. The long nerve stimulator device unchanged over the anterior left chest wall with lead extending into the left neck. Lungs are adequately inflated. No focal airspace consolidation or effusion. Cardiomediastinal silhouette and remainder of the exam is unchanged. IMPRESSION: No acute cardiopulmonary disease. Electronically Signed   By: DMarin OlpM.D.   On: 06/01/2022 09:28    Subjective: Patient was seen and examined at bedside.  Overnight events noted.  Patient  reports feeling much improved. Family reports he is doing much better and wants to be discharged.  Patient is being discharged home.  Discharge Exam: Vitals:   06/05/22 0938 06/05/22 1213  BP: 103/75 97/80  Pulse: 81 87  Resp:  16  Temp:  98.2 F (36.8 C)  SpO2:  96%    Vitals:   06/04/22 2335 06/05/22 0538 06/05/22 0938 06/05/22 1213  BP:  (!) 86/66 103/75 97/80  Pulse:  78 81 87  Resp: '18 20  16  '$ Temp:  97.8 F (36.6 C)  98.2 F (36.8 C)  TempSrc:  Oral  Oral  SpO2:  97%  96%  Weight:      Height:        General: Pt is alert, awake, not in acute distress Cardiovascular: RRR, S1/S2 +, no rubs, no gallops Respiratory: CTA bilaterally, no wheezing, no rhonchi Abdominal: Soft, NT, ND, bowel sounds + Extremities: no edema, no cyanosis    The results of significant diagnostics from this hospitalization (including imaging, microbiology, ancillary and laboratory) are listed below for reference.     Microbiology: Recent Results (from the past 240 hour(s))  SARS Coronavirus 2 by RT PCR (hospital order, performed in [facility] hospital lab) *cepheid single result test* Anterior Nasal Swab     Status: None   Collection Time: 06/01/22 10:51 AM   Specimen: Anterior Nasal Swab  Result Value Ref Range Status   SARS Coronavirus 2 by RT PCR NEGATIVE NEGATIVE Final    Comment: (NOTE) SARS-CoV-2 target nucleic acids are NOT DETECTED.  The SARS-CoV-2 RNA is generally detectable in upper and lower respiratory specimens during the acute phase of infection. The lowest concentration of SARS-CoV-2 viral copies this assay can detect is 250 copies / mL. A negative result does not preclude SARS-CoV-2 infection and should not be used as the sole basis for treatment or other patient management decisions.  A negative result may occur with improper specimen collection / handling, submission of specimen other than nasopharyngeal swab, presence of viral mutation(s) within the areas targeted by this assay, and inadequate number of viral copies (<250 copies / mL). A negative result must be combined with clinical observations, patient history, and epidemiological information.  Fact Sheet for Patients:   https://www.patel.info/  Fact Sheet for  Healthcare Providers: https://hall.com/  This test is not yet approved or  cleared by the [place] FDA and has been authorized for detection and/or diagnosis of SARS-CoV-2 by FDA under an Emergency Use Authorization (EUA).  This EUA will remain in effect (meaning this test can be used) for the duration of the COVID-19 declaration under Section 564(b)(1) of the Act, 21 U.S.C. section 360bbb-3(b)(1), unless the authorization is terminated or revoked sooner.  Performed at [facility], [address] [place], [place] [address]   Blood Culture (routine x 2)     Status: None (Preliminary result)   Collection Time: 06/01/22  1:24 PM   Specimen: BLOOD LEFT FOREARM  Result Value Ref Range Status   Specimen Description   Final    BLOOD LEFT FOREARM Performed at [facility], [name], [place], [place] [address]    Special Requests   Final    BOTTLES DRAWN AEROBIC AND ANAEROBIC Blood Culture adequate volume Performed at [facility], [address], [place], [place] [address]    Culture   Final    NO GROWTH 4 DAYS Performed at [facility], [place] [address], [place], [place] [address]  Report Status PENDING  Incomplete  Urine Culture     Status: None   Collection Time: 06/01/22  1:24 PM   Specimen: In/Out Cath Urine  Result Value Ref Range Status   Specimen Description   Final    IN/OUT CATH URINE Performed at [facility], [address], [place], [place] [address]    Special Requests   Final    NONE Performed at [facility], [address], [place], [place] [address]    Culture   Final    NO GROWTH Performed at [facility], [place] [address], [place], [place] [address]    Report Status 06/02/2022 FINAL  Final     Labs: BNP (last 3 results) No results for input(s): "BNP" in the last 8760 hours. Basic Metabolic Panel: Recent  Labs  Lab 06/01/22 1051 06/02/22 0331 06/03/22 0356 06/04/22 0300  NA 136 139 137 139  K 3.7 3.3* 3.2* 3.9  CL 103 110 108 110  CO2 '22 23 22 22  '$ GLUCOSE 136* 108* 101* 99  BUN 10 8 5* 7  CREATININE 0.92 0.86 0.67 0.74  CALCIUM 9.2 8.0* 8.1* 8.2*  MG  --   --  1.6* 2.1   Liver Function Tests: Recent Labs  Lab 06/01/22 1051 06/02/22 0331  AST 20 19  ALT 39 37  ALKPHOS 108 87  BILITOT 0.5 0.6  PROT 7.8 6.8  ALBUMIN 3.9 2.6*   No results for input(s): "LIPASE", "AMYLASE" in the last 168 hours. No results for input(s): "AMMONIA" in the last 168 hours. CBC: Recent Labs  Lab 06/01/22 1051 06/02/22 0331 06/04/22 0300 06/05/22 0416  WBC 9.9 7.7 7.7 6.3  NEUTROABS 8.0*  --   --   --   HGB 12.0* 10.4* 10.1* 10.6*  HCT 36.2* 32.2* 31.6* 33.2*  MCV 85.8 89.4 89.5 89.2  PLT 375 319 338 465*   Cardiac Enzymes: No results for input(s): "CKTOTAL", "CKMB", "CKMBINDEX", "TROPONINI" in the last 168 hours. BNP: Invalid input(s): "POCBNP" CBG: Recent Labs  Lab 06/05/22 1207  GLUCAP 106*   D-Dimer No results for input(s): "DDIMER" in the last 72 hours. Hgb A1c No results for input(s): "HGBA1C" in the last 72 hours. Lipid Profile No results for input(s): "CHOL", "HDL", "LDLCALC", "TRIG", "CHOLHDL", "LDLDIRECT" in the last 72 hours. Thyroid function studies No results for input(s): "TSH", "T4TOTAL", "T3FREE", "THYROIDAB" in the last 72 hours.  Invalid input(s): "FREET3" Anemia work up No results for input(s): "VITAMINB12", "FOLATE", "FERRITIN", "TIBC", "IRON", "RETICCTPCT" in the last 72 hours. Urinalysis    Component Value Date/Time   COLORURINE YELLOW 06/01/2022 1324   APPEARANCEUR CLEAR 06/01/2022 1324   LABSPEC 1.005 06/01/2022 1324   PHURINE 6.0 06/01/2022 1324   GLUCOSEU NEGATIVE 06/01/2022 1324   HGBUR NEGATIVE 06/01/2022 1324   BILIRUBINUR NEGATIVE 06/01/2022 1324   KETONESUR NEGATIVE 06/01/2022 1324   PROTEINUR NEGATIVE 06/01/2022 1324   UROBILINOGEN 0.2  04/21/2014 2218   NITRITE NEGATIVE 06/01/2022 1324   LEUKOCYTESUR NEGATIVE 06/01/2022 1324   Sepsis Labs Recent Labs  Lab 06/01/22 1051 06/02/22 0331 06/04/22 0300 06/05/22 0416  WBC 9.9 7.7 7.7 6.3   Microbiology Recent Results (from the past 240 hour(s))  SARS Coronavirus 2 by RT PCR (hospital order, performed in [facility] hospital lab) *cepheid single result test* Anterior Nasal Swab     Status: None   Collection Time: 06/01/22 10:51 AM   Specimen: Anterior Nasal Swab  Result Value Ref Range Status   SARS Coronavirus 2  by RT PCR NEGATIVE NEGATIVE Final    Comment: (NOTE) SARS-CoV-2 target nucleic acids are NOT DETECTED.  The SARS-CoV-2 RNA is generally detectable in upper and lower respiratory specimens during the acute phase of infection. The lowest concentration of SARS-CoV-2 viral copies this assay can detect is 250 copies / mL. A negative result does not preclude SARS-CoV-2 infection and should not be used as the sole basis for treatment or other patient management decisions.  A negative result may occur with improper specimen collection / handling, submission of specimen other than nasopharyngeal swab, presence of viral mutation(s) within the areas targeted by this assay, and inadequate number of viral copies (<250 copies / mL). A negative result must be combined with clinical observations, patient history, and epidemiological information.  Fact Sheet for Patients:   https://www.patel.info/  Fact Sheet for Healthcare Providers: https://hall.com/  This test is not yet approved or  cleared by the [place] FDA and has been authorized for detection and/or diagnosis of SARS-CoV-2 by FDA under an Emergency Use Authorization (EUA).  This EUA will remain in effect (meaning this test can be used) for the duration of the COVID-19 declaration under Section 564(b)(1) of the Act, 21 U.S.C. section 360bbb-3(b)(1), unless the  authorization is terminated or revoked sooner.  Performed at [facility], [address] [place], [place] [address]   Blood Culture (routine x 2)     Status: None (Preliminary result)   Collection Time: 06/01/22  1:24 PM   Specimen: BLOOD LEFT FOREARM  Result Value Ref Range Status   Specimen Description   Final    BLOOD LEFT FOREARM Performed at [facility], [address] [place], [place] [address]    Special Requests   Final    BOTTLES DRAWN AEROBIC AND ANAEROBIC Blood Culture adequate volume Performed at [facility], [address] [place], [place] [address]    Culture   Final    NO GROWTH 4 DAYS Performed at [facility], [place] [address] [place], [place] [address]    Report Status PENDING  Incomplete  Urine Culture     Status: None   Collection Time: 06/01/22  1:24 PM   Specimen: In/Out Cath Urine  Result Value Ref Range Status   Specimen Description   Final    IN/OUT CATH URINE Performed at [facility], [address] [place], [place] [address]    Special Requests   Final    NONE Performed at [facility], [address] [place],  [id number]    Culture   Final    NO GROWTH Performed at [facility], [place] [address], [place],  [address]    Report Status 06/02/2022 FINAL  Final     Time coordinating discharge: Over 30 minutes  SIGNED:   Shawna Clamp, MD  [facility] 06/05/2022, 3:19 PM Pager   If 7PM-7AM, please contact night-coverage

## 2022-06-05 NOTE — Progress Notes (Signed)
Physical Therapy Treatment Patient Details Name: [name] MRN: [id number] DOB: 03/26/1986 Today's Date: 06/05/2022   History of Present Illness 36 y.o. male with medical history significant of allergic rhinitis, Brown syndrome, growth disorder, migraine headaches, moderate mitral regurgitation/MVP, mild mental retardation, hyperlipidemia, hypokalemia, seizure disorder, sleep apnea on CPAP, hypothyroidism, PAF not on AC. Admitted with pneumonia.    PT Comments    Per RN and family, pt to d/c home later today after completing antibiotics.  Pt and family agreeable for ambulation.  Pt assisted with ambulating 120 feet and requiring min assist to stabilize intermittently.  Pt has gait belt in room and recommended family take home for use upon d/c.   Recommendations for follow up therapy are one component of a multi-disciplinary discharge planning process, led by the attending physician.  Recommendations may be updated based on patient status, additional functional criteria and insurance authorization.  Follow Up Recommendations  Home health PT     Assistance Recommended at Discharge Intermittent Supervision/Assistance  Patient can return home with the following A little help with walking and/or transfers;A little help with bathing/dressing/bathroom;Assistance with cooking/housework;Direct supervision/assist for medications management;Assist for transportation;Help with stairs or ramp for entrance;Direct supervision/assist for financial management   Equipment Recommendations  None recommended by PT    Recommendations for Other Services       Precautions / Restrictions Precautions Precautions: Fall Precaution Comments: mother reports pt has occasional falls     Mobility  Bed Mobility Overal bed mobility: Needs Assistance Bed Mobility: Supine to Sit     Supine to sit: Supervision, HOB elevated          Transfers Overall transfer level: Needs assistance    Transfers: Sit to/from Stand Sit to Stand: Min assist           General transfer comment: light assist to complete rise and steady    Ambulation/Gait Ambulation/Gait assistance: Min assist Gait Distance (Feet): 120 Feet Assistive device: Rolling walker (2 wheels) Gait Pattern/deviations: Step-through pattern, Decreased stride length Gait velocity: decr     General Gait Details: intermittent min A for balance, no dyspnea   Stairs             Wheelchair Mobility    Modified Rankin (Stroke Patients Only)       Balance Overall balance assessment: Needs assistance, History of Falls         Standing balance support: Bilateral upper extremity supported, Reliant on assistive device for balance Standing balance-Leahy Scale: Poor Standing balance comment: min to mod A for balance for static and dynamic standing                            Cognition Arousal/Alertness: Awake/alert Behavior During Therapy: WFL for tasks assessed/performed Overall Cognitive Status: History of cognitive impairments - at baseline                                          Exercises      General Comments        Pertinent Vitals/Pain Pain Assessment Pain Assessment: No/denies pain    Home Living                          Prior Function            PT Goals (current  goals can now be found in the care plan section) Progress towards PT goals: Progressing toward goals    Frequency    Min 3X/week      PT Plan Current plan remains appropriate    Co-evaluation              AM-PAC PT "6 Clicks" Mobility   Outcome Measure  Help needed turning from your back to your side while in a flat bed without using bedrails?: A Little Help needed moving from lying on your back to sitting on the side of a flat bed without using bedrails?: A Little Help needed moving to and from a bed to a chair (including a wheelchair)?: A Little Help  needed standing up from a chair using your arms (e.g., wheelchair or bedside chair)?: A Little Help needed to walk in hospital room?: A Little Help needed climbing 3-5 steps with a railing? : A Lot 6 Click Score: 17    End of Session Equipment Utilized During Treatment: Gait belt Activity Tolerance: Patient tolerated treatment well;No increased pain Patient left: in chair;with call bell/phone within reach;with family/visitor present;with chair alarm set   PT Visit Diagnosis: Difficulty in walking, not elsewhere classified (R26.2);Unsteadiness on feet (R26.81)     Time: 3235-5732 PT Time Calculation (min) (ACUTE ONLY): 14 min  Charges:  $Gait Training: 8-22 mins                     Jannette Spanner PT, DPT Physical Therapist Acute Rehabilitation Services Preferred contact method: Secure Chat Weekend Pager Only: [phone] Office: [place] 06/05/2022, 3:44 PM

## 2022-06-05 NOTE — TOC Transition Note (Signed)
Transition of Care [facility]) - CM/SW Discharge Note   Patient Details  Name: [name] MRN: [id number] Date of Birth: [birth year]  [facility] CM/SW Contact:  Dessa Phi, RN Phone Number: 06/05/2022, 11:08 AM   Clinical Narrative: d/c home no needs.      Final next level of care: Home/Self Care Barriers to Discharge: No Barriers Identified   Patient Goals and CMS Choice Patient states their goals for this hospitalization and ongoing recovery are:: Pt is going home with family requires no needs. CMS Medicare.gov Compare Post Acute Care list provided to:: Patient Choice offered to / list presented to : Patient  Discharge Placement                       Discharge Plan and Services   Discharge Planning Services: CM Consult                                 Social Determinants of Health (SDOH) Interventions     Readmission Risk Interventions     No data to display

## 2022-06-05 NOTE — Progress Notes (Signed)
Patient discharged home with both parents present.  Reviewed AVS including meds and follow up appointments.  Advised to complete entire dose of antibiotics as prescribed.  IV team here to de-access port.  No questions at this time, understanding verbalized,  patient in NAD.

## 2022-06-06 LAB — CULTURE, BLOOD (ROUTINE X 2)
Culture: NO GROWTH
Special Requests: ADEQUATE

## 2022-06-16 ENCOUNTER — Other Ambulatory Visit (INDEPENDENT_AMBULATORY_CARE_PROVIDER_SITE_OTHER): Payer: Self-pay

## 2022-06-16 DIAGNOSIS — E876 Hypokalemia: Secondary | ICD-10-CM

## 2022-06-17 MED ORDER — K-PHOS 500 MG PO TABS: 500.0000 mg | ORAL_TABLET | Freq: Four times a day (QID) | ORAL | 3 refills | Status: DC

## 2022-06-17 NOTE — Telephone Encounter (Signed)
I called Mom and talked with her about the potassium supplement. I will refill it for now but recommended that she talk with his PCP about managing that in the future. She agreed with this plan. TG

## 2022-06-18 ENCOUNTER — Telehealth (INDEPENDENT_AMBULATORY_CARE_PROVIDER_SITE_OTHER): Payer: Self-pay | Admitting: Family

## 2022-06-18 DIAGNOSIS — G4733 Obstructive sleep apnea (adult) (pediatric): Secondary | ICD-10-CM

## 2022-06-18 DIAGNOSIS — G40119 Localization-related (focal) (partial) symptomatic epilepsy and epileptic syndromes with simple partial seizures, intractable, without status epilepticus: Secondary | ICD-10-CM

## 2022-06-18 DIAGNOSIS — G40319 Generalized idiopathic epilepsy and epileptic syndromes, intractable, without status epilepticus: Secondary | ICD-10-CM

## 2022-06-18 NOTE — Telephone Encounter (Signed)
Mom contacted me to report that she has not heard about scheduling the sleep study that was ordered in August. I checked with [facility] and the order was cancelled because the patient has to see a [place] doctor in order to have a sleep study there. I will refer [name] to [facility] for OSA. TG

## 2022-06-25 ENCOUNTER — Other Ambulatory Visit (INDEPENDENT_AMBULATORY_CARE_PROVIDER_SITE_OTHER): Payer: Self-pay | Admitting: Family

## 2022-06-25 DIAGNOSIS — E876 Hypokalemia: Secondary | ICD-10-CM

## 2022-06-25 MED ORDER — K-PHOS 500 MG PO TABS: 500.0000 mg | ORAL_TABLET | Freq: Four times a day (QID) | ORAL | 3 refills | Status: DC

## 2022-06-25 NOTE — Telephone Encounter (Signed)
Mom contacted me to report that she had to pay for the K-Phos because it was written as generic. I called the pharmacy to inquire, as he has received generic in the past. I was told that he received Phosph-Trin K'500mg'$ , which is not covered by Medicaid. The pharmacist will put a note in computer to always given him K-Phos Original brand. He will need a new prescription sent for Brand Medically Necessary, which I will do. TG

## 2022-07-21 ENCOUNTER — Encounter: Payer: Self-pay | Admitting: Gastroenterology

## 2022-07-21 ENCOUNTER — Ambulatory Visit (INDEPENDENT_AMBULATORY_CARE_PROVIDER_SITE_OTHER): Payer: Medicaid Other | Admitting: Gastroenterology

## 2022-07-21 VITALS — BP 96/76 | HR 65 | Ht 63.0 in | Wt 124.0 lb

## 2022-07-21 DIAGNOSIS — Z86010 Personal history of colonic polyps: Secondary | ICD-10-CM | POA: Diagnosis not present

## 2022-07-21 DIAGNOSIS — K625 Hemorrhage of anus and rectum: Secondary | ICD-10-CM | POA: Diagnosis not present

## 2022-07-21 MED ORDER — HYDROCORTISONE ACETATE 25 MG RE SUPP: 25.0000 mg | Freq: Two times a day (BID) | RECTAL | 2 refills | Status: DC

## 2022-07-21 NOTE — Progress Notes (Signed)
07/21/2022 [name] [id number] November 28, 1985   HISTORY OF PRESENT ILLNESS: This is a 36 year old male with past medical history of mild intellectual disability, uncontrolled seizure disorder, Brown syndrome, etc.  He presents here today with complaints of rectal bleeding.  He is a patient Dr. Woodward Ku.  He is accompanied by his mother and father.  His mother says that starting last week she has been noticing blood with each of his bowel movements.  He had the same complaint in 2020 and was treated with hydrocortisone suppositories without any improvement.  He then underwent colonoscopy as below had a large polyp removed.  Since that colonoscopy he had not had any further rectal bleeding until this began again last week.  He has not been having any constipation or straining although does not have a bowel movement daily.  No abdominal pain.  Is eating well.  Colonoscopy 08/2019:  - The examined portion of the ileum was normal. - One 5 mm polyp in the transverse colon, removed with a cold snare. Resected and retrieved. - One 2 mm polyp in the transverse colon, removed with a cold biopsy forceps. Resected and retrieved. - One 30 mm polyp in the sigmoid colon, removed with a hot snare. Resected and retrieved. Clips (MR conditional) were placed. - Erythematous mucosa in the rectum. - The examination was otherwise normal.  A. COLON, TRANSVERSE, POLYPECTOMY:  - Tubular adenoma(s)  - Negative for high-grade dysplasia or malignancy   B. COLON, SIGMOID, POLYPECTOMY:  - Tubular adenoma(s)  - Negative for high-grade dysplasia or malignancy  - Mucosal margins appear negative for dysplasia   Past Medical History:  Diagnosis Date   ALLERGIC RHINITIS 12/26/2009   Blood clot in vein 2015   Brown's syndrome    Complication of anesthesia    seizure after pac surgery at [facility] 2013 had to be intubated, no other anes issurs   Growth disorder    Headache    migraines   Heart murmur     moderate MR ses dr Darcus Pester   Hyperlipidemia    No therapy   Hypokalemia    Mental retardation, mild (I.Q. 50-70)    MITRAL VALVE PROLAPSE 06/22/2007   SEIZURE DISORDER 06/22/2007   last seizure 08-02-2019   Sleep apnea    cpap , last sleep study 10/01/11   Unspecified hypothyroidism 06/22/2007   Past Surgical History:  Procedure Laterality Date   ANKLE SURGERY Left    fused does not bend, wears right leg brace   BIOPSY  08/22/2019   Procedure: BIOPSY;  Surgeon: Mauri Pole, MD;  Location: [facility];  Service: Endoscopy;;   COLONOSCOPY WITH PROPOFOL N/A 08/22/2019   Procedure: COLONOSCOPY WITH PROPOFOL;  Surgeon: Mauri Pole, MD;  Location: [facility];  Service: Endoscopy;  Laterality: N/A;   EYE SURGERY     X2 for Brown Syndrome   HEMOSTASIS CLIP PLACEMENT  08/22/2019   Procedure: HEMOSTASIS CLIP PLACEMENT;  Surgeon: Mauri Pole, MD;  Location: [facility];  Service: Endoscopy;;   IMPLANTATION VAGAL NERVE STIMULATOR     Left leaves on for surgeries   POLYPECTOMY  08/22/2019   Procedure: POLYPECTOMY;  Surgeon: Mauri Pole, MD;  Location: [facility];  Service: Endoscopy;;   PORTACATH PLACEMENT  2013 and removed and new placed   right chest    reports that he has never smoked. He has never been exposed to tobacco smoke. He has never used smokeless tobacco. He reports that he does not  drink alcohol and does not use drugs. family history includes Breast cancer in his mother; Clotting disorder in his mother; Colon polyps in his father and mother; Congestive Heart Failure in his maternal grandfather; Coronary artery disease in his maternal grandfather and paternal grandfather; Diabetes in his father; Hyperlipidemia in his father; Hypertension in his father; Lung cancer in his paternal grandfather; Pneumonia in his maternal grandfather and paternal grandmother; Stroke in his paternal grandmother. Allergies  Allergen Reactions   Antihistamines,  Chlorpheniramine-Type Other (See Comments)    Other Reaction: Dimetapp causes seizures   Divalproex Sodium Other (See Comments)    Nausea, vomiting, liver and kidney dysfunction   Phenytoin Rash and Other (See Comments)    Dilantin causes more seizures.    Valproic Acid And Related Other (See Comments)    Shuts down systems   Vancomycin Rash, Other (See Comments) and Swelling    If given too fast, causes red man reaction Other Reaction: Redman's Syndrome      Outpatient Encounter Medications as of 07/21/2022  Medication Sig   acetaminophen (TYLENOL) 500 MG tablet Take 1,000 mg by mouth every 6 (six) hours as needed for headache.   albuterol (ACCUNEB) 1.25 MG/3ML nebulizer solution Take 1 ampule by nebulization every 4 (four) hours as needed for wheezing.   carbamazepine (TEGRETOL) 100 MG chewable tablet CHEW 2 TABLETS IN THE MORNING, 2 TABLETS AT MIDDAY AND 1 & 1/2 TABLETS IN THE EVENING (Patient taking differently: CHEW 1 1/2TABLETS IN THE MORNING, 1 1/2 TABLETS AT MIDDAY AND 1 & 1/2 TABLETS IN THE EVENING)   Flaxseed, Linseed, (FLAXSEED OIL) 1000 MG CAPS Take 1,000 mg by mouth daily.   Krill Oil (OMEGA-3) 500 MG CAPS Take 500 mg by mouth daily.   levETIRAcetam (KEPPRA) 500 MG tablet Take 1,000-1,250 mg by mouth in the morning and at bedtime. 2 tabs in the morning & 2.5 tabs at night   levothyroxine (SYNTHROID) 50 MCG tablet Take 50 mcg by mouth daily before breakfast.   LORazepam (ATIVAN) 2 MG/ML injection Inject 1 mL (2 mg total) into the vein See admin instructions. Dilute '2mg'$  of lorazepam with 43m of saline.  Administer IV push over 3-5 minutes as needed for seizures.   montelukast (SINGULAIR) 10 MG tablet Take 10 mg by mouth at bedtime.   Multiple Vitamin (MULTIVITAMIN WITH MINERALS) TABS Take 1 tablet by mouth daily.   Omega-3 Fatty Acids (FISH OIL) 500 MG CAPS Take by mouth.   [name] 10 MG tablet TAKE 1 TABLET BY MOUTH IN THE MORNING, 1 IN THE AFTERNOON AND 2 AT BEDTIME (Patient  taking differently: Take 10-20 mg by mouth in the morning, at noon, and at bedtime. 10 mg in the morning, 10 mg mid-afternoon ( 330p) 20 mg at bedtime)   PHENObarbital (LUMINAL) 65 MG/ML injection Withdraw 163m('65mg'$ ) Phenobarbital and 37m29mormal Saline into 5ml39mringe. [place] IV push for 3-5 minutes as needed for recurrent seizures   potassium chloride SA (KLOR-CON M20) 20 MEQ tablet TAKE 1 TABLET BY MOUTH EVERY DAY   promethazine (PHENERGAN) 25 MG suppository Place 25 mg rectally every 6 (six) hours as needed for nausea or vomiting.    rosuvastatin (CRESTOR) 10 MG tablet Take 10 mg by mouth at bedtime.   Topiramate ER (TROKENDI XR) 200 MG CP24 Take 1 capsule by mouth at bedtime.   TROKENDI XR 50 MG CP24 Take 1 capsule at nighttime   VIMPAT 100 MG TABS TAKE 1 TABLET IN THE MORNING AND TAKE 2 TABLETS AT  NIGHT (Patient taking differently: Take 100-200 mg by mouth in the morning and at bedtime. 100 mg in the morning & 200 mg at bedtime)   K-PHOS 500 MG tablet Take 1 tablet (500 mg total) by mouth 4 (four) times daily. (Patient not taking: Reported on 07/21/2022)   No facility-administered encounter medications on file as of 07/21/2022.     REVIEW OF SYSTEMS  : All other systems reviewed and negative except where noted in the History of Present Illness.   PHYSICAL EXAM: BP 96/76   Pulse 65   Ht '5\' 3"'$  (1.6 m)   Wt 124 lb (56.2 kg)   BMI 21.97 kg/m  General: White male in no acute distress Rectal: No external abnormalities noted.  DRE revealed a mobile lump of stool in the rectal vault that was manually removed, but no other abnormalities or masses.  Anoscopy was performed and showed small internal hemorrhoids.  There was fresh bright red blood found in the rectal vault as well. Extremities: No edema  Psychological:  Alert and cooperative. Normal mood and affect.  ASSESSMENT AND PLAN: *Personal history of colon polyps: Had a 30 mm polyp removed from the sigmoid colon in December 2020.  Is  due for 3-year colonoscopy recall.  We will need to schedule this with Dr. Silverio Decamp, but looks like it will be in to early next year at this point. *Rectal bleeding: They report similar bleeding prior to having the large polyp removed last time.  Anoscopy today showed small internal hemorrhoids and some fresh bright red rectal bleeding.  We will treat this as hemorrhoidal bleeding for now with hydrocortisone suppositories at bedtime.  They will send [place] a message back with an update in the next couple of weeks.   CC:  Chesley Noon, MD

## 2022-07-21 NOTE — Patient Instructions (Signed)
It has been recommended that you have an colonoscopy at the hospital with [facility] will be contacted once Jan 2024 hospital schedule is available.   We have sent the following medications to your pharmacy for you to pick up at your convenience: Hydrocortisone Suppositories- Use nightly for  1 week.   Send my chart message in 1-2 weeks with updates.   _______________________________________________________  If you are age 35 or older, your body mass index should be between 23-30. Your Body mass index is 21.97 kg/m. If this is out of the aforementioned range listed, please consider follow up with your Primary Care Provider.  If you are age 68 or younger, your body mass index should be between 19-25. Your Body mass index is 21.97 kg/m. If this is out of the aformentioned range listed, please consider follow up with your Primary Care Provider.   ________________________________________________________  The [place] [facility] providers would like to encourage you to use [facility] to communicate with providers for non-urgent requests or questions.  Due to long hold times on the telephone, sending your provider a message by [facility] may be a faster and more efficient way to get a response.  Please allow 48 business hours for a response.  Please remember that this is for non-urgent requests.  _______________________________________________________  Thank you for choosing me and [facility].  Alonza Bogus PA

## 2022-07-30 ENCOUNTER — Institutional Professional Consult (permissible substitution): Payer: Medicaid Other | Admitting: Neurology

## 2022-07-30 ENCOUNTER — Encounter: Payer: Self-pay | Admitting: Neurology

## 2022-08-08 ENCOUNTER — Other Ambulatory Visit: Payer: Self-pay

## 2022-08-08 DIAGNOSIS — K625 Hemorrhage of anus and rectum: Secondary | ICD-10-CM

## 2022-08-08 DIAGNOSIS — Z86010 Personal history of colonic polyps: Secondary | ICD-10-CM

## 2022-08-08 MED ORDER — NA SULFATE-K SULFATE-MG SULF 17.5-3.13-1.6 GM/177ML PO SOLN: 1.0000 | ORAL | 0 refills | Status: DC

## 2022-08-08 NOTE — Progress Notes (Signed)
   Established Patient Office Visit  Subjective   Patient ID: [name], male    DOB: Oct 24, 1985  Age: 36 y.o. MRN: [id number]  No chief complaint on file.   HPI    ROS    Objective:     There were no vitals taken for this visit.   Physical Exam   No results found for any visits on 08/08/22.    The ASCVD Risk score (Arnett DK, et al., 2019) failed to calculate for the following reasons:   The 2019 ASCVD risk score is only valid for ages 88 to 56    Assessment & Plan:   Problem List Items Addressed This Visit       Digestive   Rectal bleeding - Primary   Relevant Orders   Ambulatory referral to Gastroenterology     Other   History of colon polyps   Relevant Orders   Ambulatory referral to Gastroenterology    No follow-ups on file.    Debbe Mounts, CMA

## 2022-08-08 NOTE — Progress Notes (Signed)
Patient has been schedule for colonoscopy for 10/02/22 with  Dr.Nandigam. Pt's mother has been informed. Instructions have been mailed.

## 2022-08-25 ENCOUNTER — Ambulatory Visit: Payer: Medicaid Other | Admitting: Physician Assistant

## 2022-09-08 ENCOUNTER — Ambulatory Visit (INDEPENDENT_AMBULATORY_CARE_PROVIDER_SITE_OTHER): Payer: Medicaid Other | Admitting: Neurology

## 2022-09-08 ENCOUNTER — Encounter: Payer: Self-pay | Admitting: Neurology

## 2022-09-08 VITALS — BP 109/74 | HR 81 | Ht 63.0 in | Wt 119.0 lb

## 2022-09-08 DIAGNOSIS — G40909 Epilepsy, unspecified, not intractable, without status epilepticus: Secondary | ICD-10-CM

## 2022-09-08 DIAGNOSIS — G4733 Obstructive sleep apnea (adult) (pediatric): Secondary | ICD-10-CM

## 2022-09-08 NOTE — Progress Notes (Signed)
Subjective:    Patient ID: [name] is a 36 y.o. male.  HPI    Star Age, MD, PhD [facility] [address], [address] [place], [place] [address]  Dear Otila Kluver,   The patient, [name], upon your kind request in my sleep clinic today for initial consultation of his sleep disorder, in particular, his prior diagnosis of obstructive sleep apnea.  The patient is accompanied by his parents today.  He missed an appointment on 07/30/2022. As you know, Mr. Guzzi is a 36 year old male with an underlying medical history of seizure disorder, status post [name] placement, s/p VNS, intellectual disability, Brown syndrome, allergic rhinitis, history of blood clot, headaches, hyperlipidemia, mitral valve prolapse, and hypothyroidism, who was previously diagnosed with obstructive sleep apnea.  Patient is unable to provide a detailed history but is able to answer simple questions appropriately.  History is provided by both parents.  He is compliant with his CPAP machine, he uses nasal pillows.  His machine has made a louder noise which is unusual and also they have seen an error message that the motor life has been exceeded.  His bedtime is generally around 8:30 PM or 9 PM and rise time around 8 AM.  He does not have nightly nocturia.  He does not have recurrent headaches.  He lives with his parents and his seizure dog Apollo.  His DME company is [employer].  4 nights a week he spends at his sister's house.  She has 2 daughters, ages 93 and 81 and he likes to spend time with them. Both parents have sleep apnea, father still uses a CPAP machine, mom no longer has to use her machine. I reviewed your office note from 04/25/2022.  He was diagnosed about 10 years ago.  He has an older CPAP machine.  He has had weight gain since his original sleep apnea diagnosis.  His Epworth sleepiness score is 11 out of 24, fatigue severity score is 63 out of 63.  He lives with  his parents.  He is on disability, he has no children.  He is a non-smoker and does not utilize alcohol and drinks caffeine in the form of sodas, 2 to 3/day on average. Reviewed his split-night sleep study report from 10/01/2011.  Study was interpreted by Dr. Asencion Partridge Dohmeier, baseline AHI was 33.1/h, O2 nadir 68%.  He had a split-night sleep study.  Optimal pressure was 8 cm.  I was able to review his compliance data from his current CPAP machine, he has a ResMed air sense 10 CPAP machine, compliance data from 08/10/2022 through 09/08/2022 showed 100% compliance.  Average usage of 11 hours and 8 minutes, residual AHI at goal at 0.6/h, leak acceptable with the 95th percentile at 9 L/min on a pressure of 8 cm without EPR.  His Past Medical History Is Significant For: Past Medical History:  Diagnosis Date   Afib ([place])    only per portocath removal.   ALLERGIC RHINITIS 12/26/2009   Blood clot in vein 2015   Brown's syndrome    Complication of anesthesia    seizure after pac surgery at [facility] 2013 had to be intubated, no other anes issurs   Growth disorder    Headache    migraines   Heart murmur    moderate MR ses dr Darcus Pester   Hyperlipidemia    No therapy   Hypokalemia    Mental retardation, mild (I.Q. 50-70)    MITRAL VALVE PROLAPSE 06/22/2007  SEIZURE DISORDER 06/22/2007   last seizure 08-02-2019   Sleep apnea    cpap , last sleep study 10/01/11   Unspecified hypothyroidism 06/22/2007    His Past Surgical History Is Significant For: Past Surgical History:  Procedure Laterality Date   ANKLE SURGERY Left    fused does not bend, wears right leg brace 2018 at [facility]  08/22/2019   Procedure: BIOPSY;  Surgeon: Mauri Pole, MD;  Location: [facility];  Service: Endoscopy;;   CATARACT EXTRACTION     2021   COLONOSCOPY WITH PROPOFOL N/A 08/22/2019   Procedure: COLONOSCOPY WITH PROPOFOL;  Surgeon: Mauri Pole, MD;  Location: [facility];  Service: Endoscopy;   Laterality: N/A;   EYE SURGERY     X2 for Brown Syndrome   HEMOSTASIS CLIP PLACEMENT  08/22/2019   Procedure: HEMOSTASIS CLIP PLACEMENT;  Surgeon: Mauri Pole, MD;  Location: [facility];  Service: Endoscopy;;   IMPLANTATION VAGAL NERVE STIMULATOR     Left leaves on for surgeries, new battery 2022, first put in 2005.   POLYPECTOMY  08/22/2019   Procedure: POLYPECTOMY;  Surgeon: Mauri Pole, MD;  Location: [facility];  Service: Endoscopy;;   PORTACATH PLACEMENT  2013 and removed and new placed   right chest    His Family History Is Significant For: Family History  Problem Relation Age of Onset   Breast cancer Mother    Colon polyps Mother    Clotting disorder Mother    Diabetes Father    Hypertension Father    Colon polyps Father    Hyperlipidemia Father    Atrial fibrillation Father    Diabetes Maternal Grandmother    Heart disease Maternal Grandmother    Diabetes Maternal Grandfather    Heart disease Maternal Grandfather    Coronary artery disease Maternal Grandfather    Congestive Heart Failure Maternal Grandfather        Died at 49   Pneumonia Maternal Grandfather    Diabetes Paternal Grandmother    Heart disease Paternal Grandmother    Pneumonia Paternal Grandmother        Died at 70   Stroke Paternal Grandmother    Diabetes Paternal Grandfather    Heart disease Paternal Grandfather    Coronary artery disease Paternal Grandfather    Lung cancer Paternal Grandfather        Died at 59    His Social History Is Significant For: Social History   Socioeconomic History   Marital status: Single    Spouse name: Not on file   Number of children: 0   Years of education: Not on file   Highest education level: Not on file  Occupational History   Occupation: Disabled    Employer: UNEMPLOYED  Tobacco Use   Smoking status: Never    Passive exposure: Never   Smokeless tobacco: Never  Vaping Use   Vaping Use: Never used  Substance and Sexual Activity    Alcohol use: No    Alcohol/week: 0.0 standard drinks of alcohol   Drug use: No   Sexual activity: Never  Other Topics Concern   Not on file  Social History Narrative   [place]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 Disabled.    He lives with his parents.    He enjoys helping around the house, Nascar, and football.   Caffiene soda 's with caffeien 2-3 / day   Social Determinants  of Health   Financial Resource Strain: Not on file  Food Insecurity: No Food Insecurity (06/01/2022)   Hunger Vital Sign    Worried About Running Out of Food in the Last Year: Never true    Ran Out of Food in the Last Year: Never true  Transportation Needs: No Transportation Needs (06/01/2022)   PRAPARE - Hydrologist (Medical): No    Lack of Transportation (Non-Medical): No  Physical Activity: Not on file  Stress: Not on file  Social Connections: Not on file    His Allergies Are:  Allergies  Allergen Reactions   Antihistamines, Chlorpheniramine-Type Other (See Comments)    Other Reaction: Dimetapp causes seizures   Divalproex Sodium Other (See Comments)    Nausea, vomiting, liver and kidney dysfunction   Phenytoin Rash and Other (See Comments)    Dilantin causes more seizures.    Valproic Acid And Related Other (See Comments)    Shuts down systems   Vancomycin Rash, Other (See Comments) and Swelling    If given too fast, causes red man reaction Other Reaction: Redman's Syndrome  :   His Current Medications Are:  Outpatient Encounter Medications as of 09/08/2022  Medication Sig   acetaminophen (TYLENOL) 500 MG tablet Take 1,000 mg by mouth every 6 (six) hours as needed for headache.   albuterol (ACCUNEB) 1.25 MG/3ML nebulizer solution Take 1 ampule by nebulization every 4 (four) hours as needed for wheezing.   carbamazepine (TEGRETOL) 100 MG chewable tablet CHEW 2 TABLETS IN THE MORNING, 2 TABLETS AT MIDDAY AND  1 & 1/2 TABLETS IN THE EVENING (Patient taking differently: CHEW 1 1/2TABLETS IN THE MORNING, 1 1/2 TABLETS AT MIDDAY AND 1 & 1/2 TABLETS IN THE EVENING)   Flaxseed, Linseed, (FLAXSEED OIL) 1000 MG CAPS Take 1,000 mg by mouth daily.   hydrocortisone (ANUSOL-HC) 25 MG suppository Place 1 suppository (25 mg total) rectally every 12 (twelve) hours.   K-PHOS 500 MG tablet Take 1 tablet (500 mg total) by mouth 4 (four) times daily.   Krill Oil (OMEGA-3) 500 MG CAPS Take 500 mg by mouth daily.   levETIRAcetam (KEPPRA) 500 MG tablet Take 1,000-1,250 mg by mouth in the morning and at bedtime. 2 tabs in the morning & 2.5 tabs at night   levothyroxine (SYNTHROID) 50 MCG tablet Take 50 mcg by mouth daily before breakfast.   LORazepam (ATIVAN) 2 MG/ML injection Inject 1 mL (2 mg total) into the vein See admin instructions. Dilute 88m of lorazepam with 170mof saline.  Administer IV push over 3-5 minutes as needed for seizures.   montelukast (SINGULAIR) 10 MG tablet Take 10 mg by mouth at bedtime.   Multiple Vitamin (MULTIVITAMIN WITH MINERALS) TABS Take 1 tablet by mouth daily.   Na Sulfate-K Sulfate-Mg Sulf (SUPREP BOWEL PREP KIT) 17.5-3.13-1.6 GM/177ML SOLN Take 1 kit by mouth as directed. For colonoscopy prep   Omega-3 Fatty Acids (FISH OIL) 500 MG CAPS Take by mouth.   [name] 10 MG tablet TAKE 1 TABLET BY MOUTH IN THE MORNING, 1 IN THE AFTERNOON AND 2 AT BEDTIME (Patient taking differently: Take 10-20 mg by mouth in the morning, at noon, and at bedtime. 10 mg in the morning, 10 mg mid-afternoon ( 330p) 20 mg at bedtime)   PHENObarbital (LUMINAL) 65 MG/ML injection Withdraw 21m54m43m62mhenobarbital and 4ml 46mmal Saline into 5ml s26mnge. [place] IV push for 3-5 minutes as needed for recurrent seizures   potassium chloride SA (KLOR-CON M20) 20 MEQ  tablet TAKE 1 TABLET BY MOUTH EVERY DAY   promethazine (PHENERGAN) 25 MG suppository Place 25 mg rectally every 6 (six) hours as needed for nausea or vomiting.     rosuvastatin (CRESTOR) 10 MG tablet Take 10 mg by mouth at bedtime.   Topiramate ER (TROKENDI XR) 200 MG CP24 Take 1 capsule by mouth at bedtime.   TROKENDI XR 50 MG CP24 Take 1 capsule at nighttime   VIMPAT 100 MG TABS TAKE 1 TABLET IN THE MORNING AND TAKE 2 TABLETS AT NIGHT (Patient taking differently: Take 100-200 mg by mouth in the morning and at bedtime. 100 mg in the morning & 200 mg at bedtime)   No facility-administered encounter medications on file as of 09/08/2022.  :   Review of Systems:  Out of a complete 14 point review of systems, all are reviewed and negative with the exception of these symptoms as listed below:  Review of Systems  Neurological:        Here for Sleep Consult.  ESS 11, FSS 63 (stated that know some is from medications).  Re-evaluate thru sleep study to make sure machine is still what he needs. Set up date 04-22-2017. DL printed.      Objective:  Neurological Exam  Physical Exam Physical Examination:   Vitals:   09/08/22 1127  BP: 109/74  Pulse: 81    General Examination: The patient is a very pleasant 36 y.o. male in no acute distress. He appears well-developed and well-nourished and well groomed.   HEENT: Normocephalic, atraumatic, pupils are equal, round and reactive to light, tracking is mildly impaired, hearing grossly intact, speech mildly dysarthric and scant.  Airway examination reveals a rather small airway, Mallampati class Perez, tonsils and uvula not fully visualized.  Small tongue noted.  Neck circumference of 17-1/2 inches, shorter neck noted.  Tongue protrudes centrally, perhaps slightly veering to the right.    Chest: Clear to auscultation without wheezing, rhonchi or crackles noted.  Right upper chest [name].  Heart: S1+S2+0, regular and normal without murmurs, rubs or gallops noted.   Abdomen: Soft, non-tender and non-distended.  Extremities: There is no obvious edema in the distal lower extremities bilaterally.   Skin: Warm  and dry without trophic changes noted.   Musculoskeletal: exam reveals no obvious joint deformities.   Neurologically:  Mental status: The patient is awake, alert and pays good attention, able to provide answers to simple questions quite well.   Cranial nerves II - XII are as described above under HEENT exam.  Motor exam: Normal bulk, strength and tone is noted. There is no obvious action or resting tremor.  Fine motor skills and coordination: Mildly impaired globally.    Cerebellar testing: No obvious dysmetria or intention tremor. There is no truncal or gait ataxia.  Sensory exam: intact to light touch in the upper and lower extremities.  Gait, station and balance: He stands without difficulty and does not need assistance.  He walks independently.  Assessment and plan:  In summary, Yusuf Yu Fries Perez is a very pleasant 36 y.o.-year old male with an underlying medical history of seizure disorder, status post [name] placement, s/p VNS, intellectual disability, Brown syndrome, allergic rhinitis, history of blood clot, headaches, hyperlipidemia, mitral valve prolapse, and hypothyroidism, who presents for presents for evaluation of his obstructive sleep apnea which was deemed in the severe range in 2013.  He has had some weight fluctuation, his BMI was much less at the time.  He is compliant with his CPAP  of 8 cm.  His machine is older and he would qualify for new equipment.  I suggested we proceed with a home sleep test for reevaluation and qualification of new equipment.  The patient and his parents were in agreement.  We will proceed to home sleep testing without CPAP in place and I should be able to prescribe a new machine once we confirm his diagnosis.  We can send the order directly to [facility].  We talked about the importance of compliance with CPAP therapy and also the need for a compliance follow-up once he is set up with the new equipment at home.  We  will plan a follow-up after testing.  I answered all the questions today and the patient and his parents were in agreement. Thank you very much for allowing me to participate in the care of this nice patient. If I can be of any further assistance to you please do not hesitate to call me at [phone].  Sincerely,   Star Age, MD, PhD

## 2022-09-08 NOTE — Patient Instructions (Addendum)
It was nice to meet you today!   Here is what we discussed today and what we came up with as our plan for you:    Based on your symptoms and your exam I believe you are still at risk for obstructive sleep apnea and would benefit from re-evaluation as it has been many years and you need new supplies and updated machine. Therefore, I think we should proceed with a sleep study to determine how severe your sleep apnea is. If you have more than mild OSA, I want you to consider ongoing treatment with CPAP. Please remember, the risks and ramifications of moderate to severe obstructive sleep apnea or OSA are: Cardiovascular disease, including congestive heart failure, stroke, difficult to control hypertension, arrhythmias, and even type 2 diabetes has been linked to untreated OSA. Sleep apnea causes disruption of sleep and sleep deprivation in most cases, which, in turn, can cause recurrent headaches, problems with memory, mood, concentration, focus, and vigilance. Most people with untreated sleep apnea report excessive daytime sleepiness, which can affect their ability to drive. Please do not drive if you feel sleepy.   As discussed, for your convenience, I will order a home sleep test.  Please remember not to use your CPAP the night of your home sleep test so we can get diagnostic data, not treatment data.  I will likely see you back after your sleep study to go over the test results and where to go from there. We will call you after your sleep study to advise about the results (most likely, you will hear from [place], my nurse) and to set up an appointment at the time, as necessary.    Our sleep lab administrative assistant will call you to schedule your sleep study. If you don't hear back from her by about 2 weeks from now, please feel free to call her at [phone]. You can leave a message with your phone number and concerns, if you get the voicemail box. She will call back as soon as possible.

## 2022-09-23 ENCOUNTER — Encounter: Payer: Self-pay | Admitting: Neurology

## 2022-09-25 ENCOUNTER — Encounter (HOSPITAL_COMMUNITY): Payer: Self-pay | Admitting: Gastroenterology

## 2022-10-01 ENCOUNTER — Telehealth: Payer: Self-pay | Admitting: Neurology

## 2022-10-01 ENCOUNTER — Other Ambulatory Visit (INDEPENDENT_AMBULATORY_CARE_PROVIDER_SITE_OTHER): Payer: Self-pay

## 2022-10-01 DIAGNOSIS — G40401 Other generalized epilepsy and epileptic syndromes, not intractable, with status epilepticus: Secondary | ICD-10-CM

## 2022-10-01 DIAGNOSIS — G40319 Generalized idiopathic epilepsy and epileptic syndromes, intractable, without status epilepticus: Secondary | ICD-10-CM

## 2022-10-01 MED ORDER — LORAZEPAM 2 MG/ML IJ SOLN: 2.0000 mg | INTRAMUSCULAR | 5 refills | Status: DC

## 2022-10-01 MED ORDER — PHENOBARBITAL SODIUM 65 MG/ML IJ SOLN: INTRAMUSCULAR | 5 refills | Status: DC

## 2022-10-01 NOTE — Telephone Encounter (Signed)
HST- Medicaid no auth req    Patient is scheduled at [facility] for 10/21/22 at 10 AM.  Mailed packet to the patient.

## 2022-10-01 NOTE — Anesthesia Preprocedure Evaluation (Signed)
Anesthesia Evaluation  Patient identified by MRN, date of birth, ID band Patient awake    Reviewed: Allergy & Precautions, NPO status , Patient's Chart, lab work & pertinent test results  History of Anesthesia Complications (+) history of anesthetic complications (seizure after a surgery at [facility] in 2013)  Airway Mallampati: [name]  TM Distance: >3 FB Neck ROM: Full    Dental  (+) Dental Advisory Given   Pulmonary neg shortness of breath, sleep apnea and Continuous Positive Airway Pressure Ventilation , neg COPD, neg recent URI   Pulmonary exam normal breath sounds clear to auscultation       Cardiovascular (-) hypertension(-) angina (-) Past MI, (-) Cardiac Stents and (-) CABG + dysrhythmias (with portocath removal) Atrial Fibrillation + Valvular Problems/Murmurs MVP  Rhythm:Regular Rate:Normal  HLD  TTE 02/06/2020: IMPRESSIONS     1. Normal LV function; MR not well assessed but appears mild to moderate.   2. Left ventricular ejection fraction, by estimation, is 55 to 60%. The  left ventricle has normal function. The left ventricle has no regional  wall motion abnormalities. Left ventricular diastolic parameters were  normal.   3. Right ventricular systolic function is normal. The right ventricular  size is normal. There is normal pulmonary artery systolic pressure.   4. The mitral valve is normal in structure. Mild to moderate mitral valve  regurgitation. No evidence of mitral stenosis.   5. The aortic valve is tricuspid. Aortic valve regurgitation is not  visualized. Mild aortic valve sclerosis is present, with no evidence of  aortic valve stenosis.   6. The inferior vena cava is normal in size with greater than 50%  respiratory variability, suggesting right atrial pressure of 3 mmHg.     Neuro/Psych  Headaches, Seizures - (has VNS, on Tegretol, Vimpat, and Keppra; last seizure 09/27/22), Poorly Controlled,  Developmental  delay    GI/Hepatic Neg liver ROS, Bowel prep,neg GERD  ,,  Endo/Other  Hypothyroidism    Renal/GU negative Renal ROS     Musculoskeletal   Abdominal   Peds  Hematology negative hematology ROS (+)   Anesthesia Other Findings   Reproductive/Obstetrics                             Anesthesia Physical Anesthesia Plan  ASA: 3  Anesthesia Plan: General   Post-op Pain Management:    Induction: Intravenous  PONV Risk Score and Plan: 2 and Propofol infusion  Airway Management Planned: Natural Airway and Nasal Cannula  Additional Equipment:   Intra-op Plan:   Post-operative Plan:   Informed Consent: I have reviewed the patients History and Physical, chart, labs and discussed the procedure including the risks, benefits and alternatives for the proposed anesthesia with the patient or authorized representative who has indicated his/her understanding and acceptance.     Dental advisory given  Plan Discussed with: CRNA and Anesthesiologist  Anesthesia Plan Comments: (Discussed with patient risks of MAC including, but not limited to, minor pain or discomfort, hearing people in the room, and possible need for backup general anesthesia. Risks for general anesthesia also discussed including, but not limited to, sore throat, hoarse voice, chipped/damaged teeth, injury to vocal cords, nausea and vomiting, allergic reactions, lung infection, heart attack, stroke, and death. All questions answered. )        Anesthesia Quick Evaluation

## 2022-10-02 ENCOUNTER — Ambulatory Visit (HOSPITAL_BASED_OUTPATIENT_CLINIC_OR_DEPARTMENT_OTHER): Payer: Medicaid Other | Admitting: Anesthesiology

## 2022-10-02 ENCOUNTER — Encounter (HOSPITAL_COMMUNITY): Payer: Self-pay | Admitting: Gastroenterology

## 2022-10-02 ENCOUNTER — Encounter (HOSPITAL_COMMUNITY)
Admission: RE | Disposition: A | Discharge status: Home / Self Care | Payer: Self-pay | Source: Home / Self Care | Attending: Gastroenterology

## 2022-10-02 ENCOUNTER — Ambulatory Visit (HOSPITAL_COMMUNITY)
Admission: RE | Admit: 2022-10-02 | Discharge: 2022-10-02 | Disposition: A | Discharge status: Home / Self Care | Payer: Medicaid Other | Attending: Gastroenterology | Admitting: Gastroenterology

## 2022-10-02 ENCOUNTER — Other Ambulatory Visit: Payer: Self-pay

## 2022-10-02 ENCOUNTER — Ambulatory Visit (HOSPITAL_COMMUNITY): Payer: Medicaid Other | Admitting: Anesthesiology

## 2022-10-02 DIAGNOSIS — E039 Hypothyroidism, unspecified: Secondary | ICD-10-CM | POA: Insufficient documentation

## 2022-10-02 DIAGNOSIS — K921 Melena: Secondary | ICD-10-CM | POA: Diagnosis present

## 2022-10-02 DIAGNOSIS — Z79890 Hormone replacement therapy (postmenopausal): Secondary | ICD-10-CM | POA: Insufficient documentation

## 2022-10-02 DIAGNOSIS — F70 Mild intellectual disabilities: Secondary | ICD-10-CM | POA: Insufficient documentation

## 2022-10-02 DIAGNOSIS — I4891 Unspecified atrial fibrillation: Secondary | ICD-10-CM | POA: Insufficient documentation

## 2022-10-02 DIAGNOSIS — Z79899 Other long term (current) drug therapy: Secondary | ICD-10-CM | POA: Insufficient documentation

## 2022-10-02 DIAGNOSIS — K625 Hemorrhage of anus and rectum: Secondary | ICD-10-CM

## 2022-10-02 DIAGNOSIS — G40909 Epilepsy, unspecified, not intractable, without status epilepticus: Secondary | ICD-10-CM | POA: Insufficient documentation

## 2022-10-02 DIAGNOSIS — K6289 Other specified diseases of anus and rectum: Secondary | ICD-10-CM | POA: Diagnosis not present

## 2022-10-02 DIAGNOSIS — I340 Nonrheumatic mitral (valve) insufficiency: Secondary | ICD-10-CM | POA: Insufficient documentation

## 2022-10-02 DIAGNOSIS — R6250 Unspecified lack of expected normal physiological development in childhood: Secondary | ICD-10-CM | POA: Diagnosis not present

## 2022-10-02 DIAGNOSIS — Z86010 Personal history of colonic polyps: Secondary | ICD-10-CM

## 2022-10-02 DIAGNOSIS — E785 Hyperlipidemia, unspecified: Secondary | ICD-10-CM | POA: Insufficient documentation

## 2022-10-02 DIAGNOSIS — I341 Nonrheumatic mitral (valve) prolapse: Secondary | ICD-10-CM | POA: Insufficient documentation

## 2022-10-02 DIAGNOSIS — Z83719 Family history of colon polyps, unspecified: Secondary | ICD-10-CM | POA: Insufficient documentation

## 2022-10-02 DIAGNOSIS — Z9989 Dependence on other enabling machines and devices: Secondary | ICD-10-CM

## 2022-10-02 DIAGNOSIS — G4730 Sleep apnea, unspecified: Secondary | ICD-10-CM | POA: Insufficient documentation

## 2022-10-02 DIAGNOSIS — G4733 Obstructive sleep apnea (adult) (pediatric): Secondary | ICD-10-CM | POA: Diagnosis not present

## 2022-10-02 HISTORY — PX: BIOPSY: SHX5522

## 2022-10-02 HISTORY — PX: COLONOSCOPY WITH PROPOFOL: SHX5780

## 2022-10-02 SURGERY — COLONOSCOPY WITH PROPOFOL
Anesthesia: General

## 2022-10-02 MED ORDER — LACTATED RINGERS IV SOLN: INTRAVENOUS | Status: DC

## 2022-10-02 MED ORDER — MESALAMINE 1000 MG RE SUPP: 1000.0000 mg | Freq: Every day | RECTAL | 0 refills | Status: DC

## 2022-10-02 MED ORDER — PROPOFOL 10 MG/ML IV BOLUS
INTRAVENOUS | Status: DC | PRN
  Administered 2022-10-02 (×2): 30 mg via INTRAVENOUS

## 2022-10-02 MED ORDER — LIDOCAINE HCL (CARDIAC) PF 100 MG/5ML IV SOSY
PREFILLED_SYRINGE | INTRAVENOUS | Status: DC | PRN
  Administered 2022-10-02: 60 mg via INTRAVENOUS

## 2022-10-02 MED ORDER — SODIUM CHLORIDE 0.9 % IV SOLN: INTRAVENOUS | Status: DC

## 2022-10-02 MED ORDER — PROPOFOL 500 MG/50ML IV EMUL
INTRAVENOUS | Status: DC | PRN
  Administered 2022-10-02: 150 ug/kg/min via INTRAVENOUS

## 2022-10-02 SURGICAL SUPPLY — 22 items

## 2022-10-02 NOTE — Op Note (Addendum)
[facility] Patient Name: [name] Procedure Date: 10/02/2022 MRN: [id number] Attending MD: Mauri Pole , MD, [id number] Date of Birth: [birth year] CSN: [id number] Age: 37 Admit Type: Outpatient Procedure:                Colonoscopy Indications:              Evaluation of unexplained GI bleeding presenting                            with Hematochezia Providers:                Mauri Pole, MD, Mikey College, RN, Vladimir Crofts, RN, [facility] Technician, Technician Referring MD:              Medicines:                Monitored Anesthesia Care Complications:            No immediate complications. Estimated Blood Loss:     Estimated blood loss was minimal. Procedure:                Pre-Anesthesia Assessment:                           - Prior to the procedure, a History and Physical                            was performed, and patient medications and                            allergies were reviewed. The patient's tolerance of                            previous anesthesia was also reviewed. The risks                            and benefits of the procedure and the sedation                            options and risks were discussed with the patient.                            All questions were answered, and informed consent                            was obtained. Prior Anticoagulants: The patient has                            taken no anticoagulant or antiplatelet agents. ASA                            Grade Assessment: II - A patient with mild systemic  disease. After reviewing the risks and benefits,                            the patient was deemed in satisfactory condition to                            undergo the procedure.                           After obtaining informed consent, the colonoscope                            was passed under direct vision. Throughout the                             procedure, the patient's blood pressure, pulse, and                            oxygen saturations were monitored continuously. The                            PCF-HQ190L ([id number]) Olympus colonoscope was                            introduced through the anus and advanced to the the                            terminal ileum, with identification of the                            appendiceal orifice and IC valve. The colonoscopy                            was performed without difficulty. The patient                            tolerated the procedure well. The quality of the                            bowel preparation was excellent. The ileocecal                            valve, appendiceal orifice, and rectum were                            photographed. Scope In: 7:57:27 AM Scope Out: 8:09:11 AM Scope Withdrawal Time: 0 hours 8 minutes 55 seconds  Total Procedure Duration: 0 hours 11 minutes 44 seconds  Findings:      The perianal and digital rectal examinations were normal.      A patchy area of mildly friable mucosa with no bleeding was found in the       rectum. Biopsies were taken with a cold forceps for histology.      The exam was otherwise without abnormality. Impression:               -  Friability with no bleeding in the rectum.                            Biopsied.                           - The examination was otherwise normal. Moderate Sedation:      N/A Recommendation:           - Patient has a contact number available for                            emergencies. The signs and symptoms of potential                            delayed complications were discussed with the                            patient. Return to normal activities tomorrow.                            Written discharge instructions were provided to the                            patient.                           - Resume previous diet.                           - Continue present medications.                            - Await pathology results.                           - Use Benefiber two teaspoons PO TID indefinitely.                           - Miralax 1 capful (17 grams) in 8 ounces of water                            PO PRN.                           - Use Canasa 1000 mg suppository 1 per rectum QHS                            for 2 weeks.                           - Avoid excessive straining during defecation                           - Follow up in GI office next available appointment  in 2-3 months Procedure Code(s):        --- Professional ---                           [phone], Colonoscopy, flexible; with biopsy, single                            or multiple Diagnosis Code(s):        --- Professional ---                           K62.89, Other specified diseases of anus and rectum                           K92.1, Melena (includes Hematochezia) CPT copyright 2022 American Medical Association. All rights reserved. The codes documented in this report are preliminary and upon coder review may  be revised to meet current compliance requirements. Mauri Pole, MD 10/02/2022 8:20:21 AM This report has been signed electronically. Number of Addenda: 0

## 2022-10-02 NOTE — Anesthesia Procedure Notes (Signed)
Procedure Name: MAC Date/Time: 10/02/2022 8:00 AM  Performed by: Lieutenant Diego, CRNAPre-anesthesia Checklist: Patient identified, Emergency Drugs available, Suction available, Patient being monitored and Timeout performed Patient Re-evaluated:Patient Re-evaluated prior to induction Oxygen Delivery Method: Nasal cannula Preoxygenation: Pre-oxygenation with 100% oxygen Induction Type: IV induction

## 2022-10-02 NOTE — Discharge Instructions (Signed)

## 2022-10-02 NOTE — Transfer of Care (Signed)
Immediate Anesthesia Transfer of Care Note  Patient: [name]  Procedure(s) Performed: COLONOSCOPY WITH PROPOFOL BIOPSY  Patient Location: Endoscopy Unit  Anesthesia Type:MAC  Level of Consciousness: awake and alert   Airway & Oxygen Therapy: Patient Spontanous Breathing and Patient connected to nasal cannula oxygen  Post-op Assessment: Report given to RN and Post -op Vital signs reviewed and stable  Post vital signs: Reviewed and stable  Last Vitals:  Vitals Value Taken Time  BP    Temp    Pulse    Resp    SpO2      Last Pain:  Vitals:   10/02/22 0657  TempSrc: Temporal  PainSc: 0-No pain         Complications: No notable events documented.

## 2022-10-02 NOTE — H&P (Signed)
[facility] History and Physical   Primary Care Physician:  Chesley Noon, MD   Reason for Procedure:   Rectal bleeding  Plan:    Colonoscopy with possible intervention     HPI: [name] is a 37 y.o. male here for colonoscopy for rectal bleeding.  Please refer to office visit note by Alonza Bogus 07/21/22 for additional details  The risks and benefits as well as alternatives of endoscopic procedure(s) have been discussed and reviewed with family. All questions answered. They agree to proceed.    Past Medical History:  Diagnosis Date   Afib ([place])    only per portocath removal.   ALLERGIC RHINITIS 12/26/2009   Blood clot in vein 2015   Brown's syndrome    Complication of anesthesia    seizure after pac surgery at [facility] 2013 had to be intubated, no other anes issurs   Growth disorder    Headache    migraines   Heart murmur    moderate MR ses dr Darcus Pester   Hyperlipidemia    No therapy   Hypokalemia    Mental retardation, mild (I.Q. 50-70)    MITRAL VALVE PROLAPSE 06/22/2007   SEIZURE DISORDER 06/22/2007   last seizure 08-02-2019   Sleep apnea    cpap , last sleep study 10/01/11   Unspecified hypothyroidism 06/22/2007    Past Surgical History:  Procedure Laterality Date   ANKLE SURGERY Left    fused does not bend, wears right leg brace 2018 at [place]  08/22/2019   Procedure: BIOPSY;  Surgeon: Mauri Pole, MD;  Location: [facility];  Service: Endoscopy;;   CATARACT EXTRACTION     2021   COLONOSCOPY WITH PROPOFOL N/A 08/22/2019   Procedure: COLONOSCOPY WITH PROPOFOL;  Surgeon: Mauri Pole, MD;  Location: [facility];  Service: Endoscopy;  Laterality: N/A;   EYE SURGERY     X2 for Brown Syndrome   HEMOSTASIS CLIP PLACEMENT  08/22/2019   Procedure: HEMOSTASIS CLIP PLACEMENT;  Surgeon: Mauri Pole, MD;  Location: [facility];  Service: Endoscopy;;   IMPLANTATION VAGAL NERVE STIMULATOR     Left  leaves on for surgeries, new battery 2022, first put in 2005.   POLYPECTOMY  08/22/2019   Procedure: POLYPECTOMY;  Surgeon: Mauri Pole, MD;  Location: [facility];  Service: Endoscopy;;   PORTACATH PLACEMENT  2013 and removed and new placed   right chest    Prior to Admission medications   Medication Sig Start Date End Date Taking? Authorizing Provider  acetaminophen (TYLENOL) 500 MG tablet Take 1,000 mg by mouth every 6 (six) hours as needed for headache.   Yes [provider]  albuterol (ACCUNEB) 1.25 MG/3ML nebulizer solution Take 1 ampule by nebulization every 4 (four) hours as needed for wheezing.   Yes [provider]  Calcium-Vitamin D-Vitamin K (VIACTIV CALCIUM PLUS D) 650-12.5-40 MG-MCG-MCG CHEW Chew 1 each by mouth daily.   Yes [provider]  carbamazepine (TEGRETOL) 100 MG chewable tablet CHEW 2 TABLETS IN THE MORNING, 2 TABLETS AT MIDDAY AND 1 & 1/2 TABLETS IN THE EVENING Patient taking differently: Chew 150 mg by mouth 3 (three) times daily. CHEW 1 1/2 TABLETS IN THE MORNING, 1 1/2 TABLETS AT MIDDAY AND 1 & 1/2 TABLETS IN THE EVENING 03/06/21  Yes Hickling, Princess Bruins, MD  cetirizine (ZYRTEC) 10 MG tablet Take 10 mg by mouth daily.   Yes [provider]  Flaxseed, Linseed, (FLAXSEED OIL) 1000 MG CAPS  Take 1,000 mg by mouth daily.   Yes [provider]  K-PHOS 500 MG tablet Take 1 tablet (500 mg total) by mouth 4 (four) times daily. 06/25/22  Yes Rockwell Germany, NP  levETIRAcetam (KEPPRA) 500 MG tablet Take 1,000-1,250 mg by mouth in the morning and at bedtime. 1,000 mg (2 tabs) in the morning & 1,250 mg (2.5 tabs) at night   Yes [provider]  levothyroxine (SYNTHROID) 50 MCG tablet Take 50 mcg by mouth daily before breakfast.   Yes [provider]  LORazepam (ATIVAN) 2 MG/ML injection Inject 1 mL (2 mg total) into the vein See admin instructions. Dilute '2mg'$  of lorazepam with 11m of saline.  Administer IV  push over 3-5 minutes as needed for seizures. 10/01/22  Yes Goodpasture, TOtila Kluver NP  montelukast (SINGULAIR) 10 MG tablet Take 10 mg by mouth at bedtime. 02/05/18  Yes [provider]  Multiple Vitamin (MULTIVITAMIN WITH MINERALS) TABS Take 1 tablet by mouth daily.   Yes [provider]  Na Sulfate-K Sulfate-Mg Sulf (SUPREP BOWEL PREP KIT) 17.5-3.13-1.6 GM/177ML SOLN Take 1 kit by mouth as directed. For colonoscopy prep 08/08/22  Yes Nickey Kloepfer, KVenia Minks MD  Omega-3 Fatty Acids (FISH OIL) 500 MG CAPS Take 500 mg by mouth daily.   Yes [provider]  [name] 10 MG tablet TAKE 1 TABLET BY MOUTH IN THE MORNING, 1 IN THE AFTERNOON AND 2 AT BEDTIME Patient taking differently: Take 10-20 mg by mouth in the morning, at noon, and at bedtime. 10 mg in the morning, 10 mg mid-afternoon ( 330p) 20 mg at bedtime 06/04/21  Yes Goodpasture, TOtila Kluver NP  PHENObarbital (LUMINAL) 65 MG/ML injection Withdraw 132m('65mg'$ ) Phenobarbital and 13m73mormal Saline into 5ml23mringe. [place] IV push for 3-5 minutes as needed for recurrent seizures 10/01/22  Yes Goodpasture, TinaOtila Kluver  potassium chloride SA (KLOR-CON M20) 20 MEQ tablet TAKE 1 TABLET BY MOUTH EVERY DAY 04/07/22  Yes GoodRockwell Germany  promethazine (PHENERGAN) 25 MG suppository Place 25 mg rectally every 6 (six) hours as needed for nausea or vomiting.  07/04/19  Yes [provider]  rosuvastatin (CRESTOR) 10 MG tablet Take 10 mg by mouth at bedtime. 05/26/16  Yes [provider]  Topiramate ER (TROKENDI XR) 200 MG CP24 Take 1 capsule by mouth at bedtime. 05/29/21  Yes HickJodi Geralds  TROKENDI XR 50 MG CP24 Take 1 capsule at nighttime 03/06/21  Yes Hickling, WillPrincess Bruins  VIMPAT 100 MG TABS TAKE 1 TABLET IN THE MORNING AND TAKE 2 TABLETS AT NIGHT Patient taking differently: Take 100-200 mg by mouth in the morning and at bedtime. 100 mg in the morning & 200 mg at bedtime 10/21/21  Yes Goodpasture, TinaOtila Kluver  clotrimazole  (LOTRIMIN) 1 % cream Apply 1 Application topically 2 (two) times daily as needed (irritation).    [provider]  hydrocortisone (ANUSOL-HC) 25 MG suppository Place 1 suppository (25 mg total) rectally every 12 (twelve) hours. Patient taking differently: Place 25 mg rectally 2 (two) times daily as needed for hemorrhoids or anal itching. 07/21/22   Zehr, JessLaban Emperor-C    Current Facility-Administered Medications  Medication Dose Route Frequency Provider Last Rate Last Admin   0.9 %  sodium chloride infusion   Intravenous Continuous Yassen Kinnett, KaviVenia Minks       lactated ringers infusion   Intravenous Continuous NandMauri Pole 50 mL/hr at 10/02/22 0726 Continued from Pre-op at 10/02/22 0726    Allergies  as of 08/08/2022 - Review Complete 07/21/2022  Allergen Reaction Noted   Antihistamines, chlorpheniramine-type Other (See Comments) 04/21/2014   Divalproex sodium Other (See Comments)    Phenytoin Rash and Other (See Comments)    Valproic acid and related Other (See Comments) 04/21/2014   Vancomycin Rash, Other (See Comments), and Swelling 01/08/2012    Family History  Problem Relation Age of Onset   Breast cancer Mother    Colon polyps Mother    Clotting disorder Mother    Diabetes Father    Hypertension Father    Colon polyps Father    Hyperlipidemia Father    Atrial fibrillation Father    Diabetes Maternal Grandmother    Heart disease Maternal Grandmother    Diabetes Maternal Grandfather    Heart disease Maternal Grandfather    Coronary artery disease Maternal Grandfather    Congestive Heart Failure Maternal Grandfather        Died at 22   Pneumonia Maternal Grandfather    Diabetes Paternal Grandmother    Heart disease Paternal Grandmother    Pneumonia Paternal Grandmother        Died at 44   Stroke Paternal Grandmother    Diabetes Paternal Grandfather    Heart disease Paternal Grandfather    Coronary artery disease Paternal Grandfather    Lung  cancer Paternal Grandfather        Died at 69    Social History   Socioeconomic History   Marital status: Single    Spouse name: Not on file   Number of children: 0   Years of education: Not on file   Highest education level: Not on file  Occupational History   Occupation: Disabled    Employer: UNEMPLOYED  Tobacco Use   Smoking status: Never    Passive exposure: Never   Smokeless tobacco: Never  Vaping Use   Vaping Use: Never used  Substance and Sexual Activity   Alcohol use: No    Alcohol/week: 0.0 standard drinks of alcohol   Drug use: No   Sexual activity: Never  Other Topics Concern   Not on file  Social History Narrative   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 Disabled.    He lives with his parents.    He enjoys helping around the house, Nascar, and football.   Caffiene soda 's with caffeien 2-3 / day   Social Determinants of Health   Financial Resource Strain: Not on file  Food Insecurity: No Food Insecurity (06/01/2022)   Hunger Vital Sign    Worried About Running Out of Food in the Last Year: Never true    Ran Out of Food in the Last Year: Never true  Transportation Needs: No Transportation Needs (06/01/2022)   PRAPARE - Hydrologist (Medical): No    Lack of Transportation (Non-Medical): No  Physical Activity: Not on file  Stress: Not on file  Social Connections: Not on file  Intimate Partner Violence: Unknown (06/01/2022)   Humiliation, Afraid, Rape, and Kick questionnaire    Fear of Current or Ex-Partner: Patient refused    Emotionally Abused: Patient refused    Physically Abused: Patient refused    Sexually Abused: Patient refused    Review of Systems:  All other review of systems negative except as mentioned in the HPI.  Physical Exam: Vital signs in last 24 hours: Temp:  [96.7 F (35.9 C)] 96.7 F (  35.9 C) (01/11 0657) Pulse Rate:  [69] 69 (01/11  0657) Resp:  [22] 22 (01/11 0657) BP: (89)/(61) 89/61 (01/11 0657) SpO2:  [100 %] 100 % (01/11 0657) Weight:  [53.5 kg] 53.5 kg (01/11 0657)   General:   Alert, NAD Lungs:  Clear .   Heart:  Regular rate and rhythm Abdomen:  Soft, nontender and nondistended. Neuro/Psych:  Alert and cooperative. Normal mood and affect. A and O x 3   K. Denzil Magnuson , MD [phone]

## 2022-10-02 NOTE — Interval H&P Note (Signed)
History and Physical Interval Note:  10/02/2022 7:45 AM  [name]  has presented today for surgery, with the diagnosis of Rectal Bleeding, H/O of colon polyps.  The various methods of treatment have been discussed with the patient and family. After consideration of risks, benefits and other options for treatment, the patient has consented to  Procedure(s): COLONOSCOPY WITH PROPOFOL (N/A) as a surgical intervention.  The patient's history has been reviewed, patient examined, no change in status, stable for surgery.  I have reviewed the patient's chart and labs.  Questions were answered to the patient's satisfaction.     [name]

## 2022-10-02 NOTE — Anesthesia Postprocedure Evaluation (Signed)
Anesthesia Post Note  Patient: [name]  Procedure(s) Performed: COLONOSCOPY WITH PROPOFOL BIOPSY     Patient location during evaluation: PACU Anesthesia Type: General Level of consciousness: awake Pain management: pain level controlled Vital Signs Assessment: post-procedure vital signs reviewed and stable Respiratory status: spontaneous breathing, nonlabored ventilation and respiratory function stable Cardiovascular status: stable and blood pressure returned to baseline Postop Assessment: no apparent nausea or vomiting Anesthetic complications: no   No notable events documented.  Last Vitals:  Vitals:   10/02/22 0820 10/02/22 0830  BP: (!) 95/59 109/76  Pulse: (!) 26 78  Resp: (!) 23 20  Temp:    SpO2: 100% 100%    Last Pain:  Vitals:   10/02/22 0830  TempSrc:   PainSc: 0-No pain                 Nilda Simmer

## 2022-10-03 LAB — SURGICAL PATHOLOGY

## 2022-10-05 ENCOUNTER — Encounter (HOSPITAL_COMMUNITY): Payer: Self-pay | Admitting: Gastroenterology

## 2022-10-08 ENCOUNTER — Encounter: Payer: Self-pay | Admitting: Gastroenterology

## 2022-10-14 ENCOUNTER — Other Ambulatory Visit (INDEPENDENT_AMBULATORY_CARE_PROVIDER_SITE_OTHER): Payer: Self-pay | Admitting: Family

## 2022-10-14 DIAGNOSIS — E876 Hypokalemia: Secondary | ICD-10-CM

## 2022-10-14 NOTE — Telephone Encounter (Signed)
Updated Environmental consultant.  HST- Medicaid Josem Kaufmann: [id number] (exp. 10/10/22 to 10/09/23)   Patient is scheduled at [facility] for 10/21/22 at 10 AM.

## 2022-10-21 ENCOUNTER — Ambulatory Visit: Payer: Medicaid Other | Admitting: Neurology

## 2022-10-21 DIAGNOSIS — G4734 Idiopathic sleep related nonobstructive alveolar hypoventilation: Secondary | ICD-10-CM

## 2022-10-21 DIAGNOSIS — G4733 Obstructive sleep apnea (adult) (pediatric): Secondary | ICD-10-CM

## 2022-10-21 DIAGNOSIS — G40909 Epilepsy, unspecified, not intractable, without status epilepticus: Secondary | ICD-10-CM

## 2022-10-23 NOTE — Addendum Note (Signed)
Addended by: Star Age on: 10/23/2022 05:28 PM   Modules accepted: Orders

## 2022-10-23 NOTE — Progress Notes (Signed)
See procedure note.

## 2022-10-23 NOTE — Procedures (Signed)
[facility]  HOME SLEEP TEST (Watch PAT) REPORT  STUDY DATE: 10/21/2022  DOB: Nov 13, 1985  MRN: [id number]  ORDERING CLINICIAN: Star Age, MD, PhD   REFERRING CLINICIAN: Rockwell Germany, NP  CLINICAL INFORMATION/HISTORY: 37 year old male with an underlying medical history of seizure disorder, status post [name] placement, s/p VNS, intellectual disability, Brown syndrome, allergic rhinitis, history of blood clot, headaches, hyperlipidemia, mitral valve prolapse, and hypothyroidism, who was previously diagnosed with obstructive sleep apnea. He is compliant with his CPAP machine. His machine has made a louder noise which is unusual and also they have seen an error message that the motor life has been exceeded.   Epworth sleepiness score: 11/24.  BMI: 21.1 kg/m  FINDINGS:   Sleep Summary:   Total Recording Time (hours, min): 10 hours, 0 min  Total Sleep Time (hours, min):  7 hours, 57 min  Percent REM (%):    11%   Respiratory Indices:   Calculated pAHI (per hour):  39/hour         REM pAHI:    43.7/hour       NREM pAHI: 38.5/hour  Central pAHI: 1.9/hour  Oxygen Saturation Statistics:    Oxygen Saturation (%) Mean: 91%   Minimum oxygen saturation (%):                 64%   O2 Saturation Range (%): 64 - 100%    O2 Saturation (minutes) <=88%: 91.2 min  Pulse Rate Statistics:   Pulse Mean (bpm):    90/min    Pulse Range (63 - 106/min)   IMPRESSION: OSA (obstructive sleep apnea), severe Nocturnal Hypoxemia  RECOMMENDATION:  This home sleep test demonstrates severe obstructive sleep apnea with a total AHI of 39/hour and O2 nadir of 64% with significant time below or at 88% saturation of over 90 minutes, indicating nocturnal hypoxemia.  Snoring was detected, ranging from mild to loud.  Ongoing treatment with positive airway pressure is highly recommended. This patient has been fully compliant with his CPAP of 8 cm without EPR.  He should  qualify for a new machine which I would like to prescribe.  CPAP of 8 cm has provided good apnea control thus far.  We will keep the pressure settings the same.  An overnight pulse oximetry test may be helpful while patient is on CPAP therapy to ensure appropriate oxygen saturations on PAP therapy at home. A laboratory attended titration study can be considered in the future for optimization of treatment settings and to improve tolerance and compliance. Alternative treatment options are limited secondary to the severity of the patient's sleep disordered breathing, but may include surgical treatment with an implantable hypoglossal nerve stimulator (in carefully selected candidates, meeting criteria). Please note, that untreated obstructive sleep apnea may carry additional perioperative morbidity. Patients with significant obstructive sleep apnea should receive perioperative PAP therapy and the surgeons and particularly the anesthesiologist should be informed of the diagnosis and the severity of the sleep disordered breathing. The patient should be cautioned not to drive, work at heights, or operate dangerous or heavy equipment when tired or sleepy. Review and reiteration of good sleep hygiene measures should be pursued with any patient. Other causes of the patient's symptoms, including circadian rhythm disturbances, an underlying mood disorder, medication effect and/or an underlying medical problem cannot be ruled out based on this test. Clinical correlation is recommended.  The patient and his referring provider will be notified of the test results. The patient will be seen in follow up  in sleep clinic at [facility].  I certify that I have reviewed the raw data recording prior to the issuance of this report in accordance with the standards of the American Academy of Sleep Medicine (AASM).  INTERPRETING PHYSICIAN:   Star Age, MD, PhD Medical Director, [facility]  [facility]) [place], ABPN (Neurology and Sleep)   [facility] [address] [place], [place] [address] [phone]

## 2022-10-28 ENCOUNTER — Telehealth: Payer: Self-pay

## 2022-10-28 NOTE — Telephone Encounter (Signed)
-----   Message from Star Age, MD sent at 10/23/2022  5:28 PM EST ----- Please call patient's parent: Patient was referred by his neurologist for sleep apnea reevaluation, he has an older CPAP machine and is compliant with treatment.  He had a recent home sleep test on 10/21/2022 which indicated severe sleep apnea.  I would like to write for a new CPAP machine, we can keep his settings the same as he had good apnea control thus far on his current machine.

## 2022-10-28 NOTE — Telephone Encounter (Signed)
I called pt. I advised pt that Dr. Rexene Alberts reviewed their sleep study results and found that pt has severe osa. Dr. Rexene Alberts recommends that pt restart a new CPAP at 8cm. I reviewed PAP compliance expectations with the pt. Pt is agreeable to starting an auto-PAP. I advised pt that an order will be sent to a DME, [facility], and [facility] will call the pt within about one week after they file with the pt's insurance. [facility] will show the pt how to use the machine, fit for masks, and troubleshoot the auto-PAP if needed. A follow up appt was made for insurance purposes with Amy, NP on 01/29/23 at 9:00am. Pt verbalized understanding to arrive 15 minutes early and bring their auto-PAP. Pt verbalized understanding of results. Pt had no questions at this time but was encouraged to call back if questions arise. I have sent the order to [facility] and have received confirmation that they have received the order.

## 2022-11-04 ENCOUNTER — Ambulatory Visit (INDEPENDENT_AMBULATORY_CARE_PROVIDER_SITE_OTHER): Payer: Medicaid Other | Admitting: Pediatrics

## 2022-11-04 ENCOUNTER — Encounter (INDEPENDENT_AMBULATORY_CARE_PROVIDER_SITE_OTHER): Payer: Self-pay | Admitting: Pediatrics

## 2022-11-04 VITALS — BP 106/64 | HR 102 | Wt 121.6 lb

## 2022-11-04 DIAGNOSIS — F70 Mild intellectual disabilities: Secondary | ICD-10-CM

## 2022-11-04 DIAGNOSIS — G43009 Migraine without aura, not intractable, without status migrainosus: Secondary | ICD-10-CM | POA: Diagnosis not present

## 2022-11-04 DIAGNOSIS — G40119 Localization-related (focal) (partial) symptomatic epilepsy and epileptic syndromes with simple partial seizures, intractable, without status epilepticus: Secondary | ICD-10-CM | POA: Diagnosis not present

## 2022-11-04 DIAGNOSIS — G4733 Obstructive sleep apnea (adult) (pediatric): Secondary | ICD-10-CM

## 2022-11-04 DIAGNOSIS — R2689 Other abnormalities of gait and mobility: Secondary | ICD-10-CM

## 2022-11-04 DIAGNOSIS — E038 Other specified hypothyroidism: Secondary | ICD-10-CM

## 2022-11-04 DIAGNOSIS — G40319 Generalized idiopathic epilepsy and epileptic syndromes, intractable, without status epilepticus: Secondary | ICD-10-CM | POA: Diagnosis not present

## 2022-11-04 NOTE — Patient Instructions (Signed)
Follow up in July 2024 Follow up with adult neurology at [facility] as schedule.  Call neurology for any questions or concern

## 2022-11-05 ENCOUNTER — Other Ambulatory Visit (INDEPENDENT_AMBULATORY_CARE_PROVIDER_SITE_OTHER): Payer: Self-pay | Admitting: Family

## 2022-11-05 DIAGNOSIS — E876 Hypokalemia: Secondary | ICD-10-CM

## 2022-11-13 NOTE — Progress Notes (Signed)
Patient: [name] MRN: [id number] Sex: male DOB: 1985/10/01  Provider: Franco Nones, MD Location of Care: Pediatric Specialist- Pediatric Neurology Note type: return visit for follow up. Visit type: in-person Referral Source: Chesley Noon, MD Date of Evaluation: 11/13/2022 Chief Complaint: Migraine without aura, epilepsy follow up.   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  He was last seen in pediatric neurology clinic in July 2023.  He had less frequent seizures couple months ago and average 2 seizures per month.  He had 1 seizure in this February.  He was hospitalized for community-acquired pneumonia and lost a lot of weight.  He had a follow-up with adult neurology Dr. Barbaraann Rondo in September 2023.  He takes lacosamide 100 mg in the morning and 200 mg at night (recent level 7.8),Trokendi 250 mg nightly (level 6.5), clobazam 10 mg in the morning, 10 mg in the afternoon, 20 mg at night (ordered 44/6280), carbamazepine 150 mg 3 times daily (level 8.4), and Keppra 1000 mg in the morning and 1250 mg at night (level 41).   Last visit July 2023, on [name] was last seen in child neurology clinic in December 2022. He is followed by adult neurology at [facility] Dr Jeoffrey Massed for his refractory epilepsy. [name] was seeing Dr Gaynell Face in this practice and family want to stay to help with his neurological needs or if he is admitted.   His mother has seizure logs: April 2032: [name] had 2 typical generalized seizures described as full body stiffness like locks up and gets facial puffiness with difficulty breathing that lasted 3 minutes in average.   May 2023; had 4 seizures brief seizures started quick with preserved awareness. He was making noises and left arm movement like trashing limp in the air. He called his family. This  lasted few seconds.   June 2023 had 2 seizures with stiffness that lasted 3 minutes. Overall, family did not use or has to use Ativan or phenobarbital as rescue medications. They had tried once to access his port to give medication but never did.  July 2023: no seizures till this visit.   His family thinks they are seeing some improvement. He does not have frequent seizures as before. He is taking and tolerating Vimpat 100 mg in the morning and 200 mg at night, keppra 1000 mg in the morning and at thousand 250 mg nightly and carbamazepine 150 mg 3 times a day.  He also takes clobazam 10 mg in the morning, 10 mg in the afternoon and 20 at night.  I could not find his antiseizure level results requested by his adult neurology at [facility].  Migraine: He still has migraine and take Trokendi 250 mg at bedtime.   Family expresses feeling more at ease and comfortable with having 2 neurologists. Expresses being aware of having different providers when hospitalized but states Dr. Gaynell Face was main point of contact. Family states that they are firm when patient is hospitalized in telling the providers they know what works for him (they carry phenobarbital and ativan on them at all times).   Epilepsy/seizure History:   Age at seizure onset: [birth year] left locomotor with secondary generalization.  Description of all seizure types and duration:  Seizure semiology:  Thrashing seizure: Sometimes swiping the magnet on VNS will help when he has this type of seizure. This thrashing does not result in  loss of consciousness but he does not respond. Typically lasts a minute and a half  Aura: patient can tell he is about to have a seizure. Family will give Diazepam 47m when he gets this feeling. Seizure dog also helps to alert about seizures Generalized stiffening (dominant)- see details above Does not shake, just stiffens and HR increases (has pulse ox at home) reaching max 130.  Complications from seizures (trauma,  etc.): trauma (hitting head), apneic episodes.   h/o status epilepticus?; none  Current AEDs:  Oxcarbazepine 157mTID  Vimpat 100am, 200 pm Clobazam 1074mm, 10 mg afternoon, 11m11m  Keppra 1000 am, 1250 pm Phenobarb prn for recurrent seizures.  Current side effects: Feels groggy and staggering with Tegretol (decreased dose improved sx).   Other Meds:  Levothyroxine 25mc40mily  Trokendi 250mg 68m rosuvastatin 10mg p23menergan, Kcl  MVI 10mg pm71manfacine   Interrogation of the vagal nerve stimulator:  VNS was implanted on 09/27/2020 and device is demi pulse Duo M104. Serial [id number]. [id number] status 2.25 mA, lead impedance 1626 ohms generator battery 75 to 100% total magnet activations 11,116.   Normal mode output current 2.25 mA, frequency 20 Hz, pulse width 250 s, on time stimulation 30 seconds, off time 1.1 minutes duty cycle 35%.  Magnet mode output current 2.5 mA, pulse width 250 s, on time stimulation 60 seconds.  Lead Impedance 1626 Ohms. Battery 50-75%. Total Magnet Activation 23569.  [id number] History: Copied from previous note:  Diagnosis of developmental delay as a toddler.   EEG 11/07/86 was normal for gestational age.   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    Initial seizure 08/28/94 left locomotor with secondary generalization.   MRI of the brain 09/13/93: Scattered subcortical white matter lesions at the parietotemporal parieto-occipital junction is ischemic versus hamartomas.   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   Diagnosis attention deficit disorder inattentive type treated without success with Cylert April 1995.    MRI brain 04/04/94 stable differential diagnosis hamartoma, vasculitis, ischemic, or embolic phenomenon.   Status epilepticus 01/31/95, and 04/15/95  Neurontin added to Tegretol.   MRI brain 06/14/96 unchanged.   [facility]. Neurontin discontinued, Lamictal started.   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    10/16/97 Tegretol plus Felbatol.   Protropin discontinued 10/13/97 frequency of seizures.from 4 per day down to one every other day and then 1-2 per week.   Topiramate started in May 1999 unable to be tolerated with Tegretol and was tapered and discontinued.   January 2000 EEG showed right greater than left mid temporal sharp waves was otherwise well-organized EEG. He hospitalizations in March, July, October, in November for recurrent seizures.    Patient placed on Depakote in April 2000 and developed gagging abdominal pain headache and had significant change in transaminases and liver functions. Topamax was restarted and Keppra was added.    Vimpat was started July 16, 2012 and adjusted upwards.   [name] was added on November 18, 2012 and has been adjusted upwards. Topamax was changed to Trokendi XR in attempt to deal with sleepiness, unsteadiness caused by polypharmacy. We to these medications have been introduced, the  patient experiences somewhat improved seizure control however Is quite likely that despite better seizure control, the patient is experiencing impairment in cognition and gait as a result of polypharmacy .   MRI of the brain on March 21, 2013. The patient has extensive calcification to the basal ganglia bilaterally, normal ventricle size, and no acute findings. EKG was unchanged.  Last visit at [facility] on 11/7 family opted not to increase antiseizure medications.  PMH: hypothyroidism, growth hormone deficiency (growth hormone med stopped at 11 due to seizures), Intellectual disability, OSA on CPAP, and  refractory epilepsy s/p VNS.   Surgical History      Past Surgical History:  Procedure Laterality Date   ANKLE SURGERY Left      fused does not bend, wears right leg brace   BIOPSY   08/22/2019    Procedure: BIOPSY;  Surgeon: Mauri Pole, MD;  Location: [facility];  Service: Endoscopy;;   COLONOSCOPY WITH PROPOFOL N/A 08/22/2019    Procedure: COLONOSCOPY WITH PROPOFOL;  Surgeon: Mauri Pole, MD;  Location: [facility];  Service: Endoscopy;  Laterality: N/A;   EYE SURGERY        X2 for Brown Syndrome   HEMOSTASIS CLIP PLACEMENT   08/22/2019    Procedure: HEMOSTASIS CLIP PLACEMENT;  Surgeon: Mauri Pole, MD;  Location: [facility];  Service: Endoscopy;;   IMPLANTATION VAGAL NERVE STIMULATOR        Left leaves on for surgeries   POLYPECTOMY   08/22/2019    Procedure: POLYPECTOMY;  Surgeon: Mauri Pole, MD;  Location: [facility];  Service: Endoscopy;;   PORTACATH PLACEMENT   2013 and removed and new placed    right chest      Family History family history includes Breast cancer in his mother; Clotting disorder in his mother; Colon polyps in his father and mother; Congestive Heart Failure in his maternal grandfather; Coronary artery disease in his maternal grandfather and paternal grandfather; Diabetes in his father; Hyperlipidemia in his father; Hypertension in his father; Lung cancer in his paternal grandfather; Pneumonia in his maternal grandfather and paternal grandmother; Stroke in his paternal grandmother. Family history is negative for migraines, seizures, intellectual disabilities, blindness, deafness, birth defects, chromosomal disorder, or autism.   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     He lives with his parents.     He enjoys helping around the house, Nascar, and football.    Allergies      Allergen Reactions   Antihistamines, Chlorpheniramine-Type Other (See Comments)      Other Reaction: Dimetapp causes seizures   Divalproex Sodium Other (See Comments)      Nausea, vomiting, liver and kidney dysfunction   Phenytoin Rash and Other (See Comments)      Dilantin causes more seizures.     Valproic Acid And Related  Other (See Comments)      Shuts down systems   Vancomycin Rash, Other (See Comments) and Swelling      If given too fast, causes red man reaction Other Reaction: Redman's Syndrome    Birth History:  He was a breech presentation, cesarean section delivery. He had intrauterine growth retardation, and failure to thrive.  Review of Systems: There is no history of fevers, chills, malaise, loss of appetite, weight loss, or difficulty sleeping.  Ophthalmologic, otolaryngologic, dermatologic, cardiovascular, gastrointestinal, genitourinary, musculoskeletal, psychiatric, and hematologic review of systems were negative.  + OSA, + Hypothyroidism.  EXAMINATION Physical examination:  Blood Pressure 106/64   Pulse (Abnormal) 102   Weight 121 lb 9.6 oz (55.2 kg)   Body Mass Index 21.55 kg/m    General examination: alert and active in no apparent distress. There are no dysmorphic features.   Chest examination reveals normal breath sounds, and normal heart sounds with no cardiac murmur.  Abdominal examination does not show any evidence of hepatic or splenic enlargement, or any abdominal masses or bruits. Skin evaluation does not reveal any caf-au-lait spots, hypo or hyperpigmented  lesions, hemangiomas or pigmented nevi.  Mild scoliosis.  Neurologic examination: Awake, alert, able to follow commands. Cranial nerves: Pupils are symmetric, circular and reactive to light.  Extraocular movements are full in range, with no strabismus.  There is no ptosis or nystagmus. Palatal movements are symmetric.  The tongue is midline. Motor assessment: The tone is normal.  Movements are symmetric in all four extremities, with no evidence of any focal weakness.  Power is more than Perez / V in all groups of muscles across all major joints.  There is no evidence of atrophy or hypertrophy of muscles.  Deep tendon reflexes are 3+ and symmetric at the biceps, knees and ankles.  Plantar response is flexor bilaterally. Sensory examination:  formal sensory examination is limited.  Co-ordination and gait:  clawhand bilaterally and unable to fully straighten his finger, Finger-to-nose testing fair bilaterally.  Clumsy finger movements and rapid alternating movements. Gait broad based, antalgic and diplegic gait but Balance is fair. Has AFO in right leg.   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 Previously discussed that there is not a need for 2 neurologists, but due to family's preference and feeling more comfortable with patient having a neurologist close to home, will still see patient every 6 months.  His mother reports that most frequent seizures on average 2 seizures per months, did not require seizure rescue medication.  He follows with all of neurology at [facility].  He takes lacosamide, clobazam, Keppra, Vimpat and Trileptal.  His  antiseizure levels within normal I Have discussed about refractory epilepsy and risk of sudden unexpected death in epilepsy SUDEP which means that in a patient with epilepsy  that is not due to trauma or other known causes which  is often evidence of an associated seizures.    Migraine without aura: he is currently taking Trokendi XR 250 mg at bedtime. Still has migraine but would like to change his medications and explore more in future.   Plan: Continue AEDs as prescribed Continue Trokendi  XR 250 mg at bedtime.  Follow up with [facility] as scheduled Follow-up with other neurology as scheduled in September 2024  Total time spent with the patient was  30 minutes, of which 50% or more was spent in counseling and coordination of care.   The plan of care was discussed, with acknowledgement of understanding expressed by his parents.   Franco Nones, MD Child Neurology and epilepsy

## 2022-11-19 ENCOUNTER — Emergency Department (HOSPITAL_COMMUNITY): Payer: Medicaid Other

## 2022-11-19 ENCOUNTER — Telehealth (INDEPENDENT_AMBULATORY_CARE_PROVIDER_SITE_OTHER): Payer: Self-pay | Admitting: Family

## 2022-11-19 ENCOUNTER — Inpatient Hospital Stay (HOSPITAL_COMMUNITY)
Admission: EM | Admit: 2022-11-19 | Discharge: 2022-11-21 | DRG: 101 | Disposition: A | Discharge status: Home / Self Care | Payer: Medicaid Other | Attending: Internal Medicine | Admitting: Internal Medicine

## 2022-11-19 ENCOUNTER — Other Ambulatory Visit: Payer: Self-pay

## 2022-11-19 ENCOUNTER — Encounter (HOSPITAL_COMMUNITY): Payer: Self-pay

## 2022-11-19 DIAGNOSIS — Z833 Family history of diabetes mellitus: Secondary | ICD-10-CM

## 2022-11-19 DIAGNOSIS — E785 Hyperlipidemia, unspecified: Secondary | ICD-10-CM | POA: Diagnosis present

## 2022-11-19 DIAGNOSIS — G43701 Chronic migraine without aura, not intractable, with status migrainosus: Secondary | ICD-10-CM | POA: Diagnosis not present

## 2022-11-19 DIAGNOSIS — Z8349 Family history of other endocrine, nutritional and metabolic diseases: Secondary | ICD-10-CM

## 2022-11-19 DIAGNOSIS — Z823 Family history of stroke: Secondary | ICD-10-CM

## 2022-11-19 DIAGNOSIS — W19XXXA Unspecified fall, initial encounter: Secondary | ICD-10-CM | POA: Diagnosis present

## 2022-11-19 DIAGNOSIS — G40419 Other generalized epilepsy and epileptic syndromes, intractable, without status epilepticus: Principal | ICD-10-CM | POA: Diagnosis present

## 2022-11-19 DIAGNOSIS — Z9689 Presence of other specified functional implants: Secondary | ICD-10-CM

## 2022-11-19 DIAGNOSIS — Z801 Family history of malignant neoplasm of trachea, bronchus and lung: Secondary | ICD-10-CM

## 2022-11-19 DIAGNOSIS — R6250 Unspecified lack of expected normal physiological development in childhood: Secondary | ICD-10-CM | POA: Diagnosis present

## 2022-11-19 DIAGNOSIS — Z86718 Personal history of other venous thrombosis and embolism: Secondary | ICD-10-CM

## 2022-11-19 DIAGNOSIS — E039 Hypothyroidism, unspecified: Secondary | ICD-10-CM | POA: Diagnosis not present

## 2022-11-19 DIAGNOSIS — G4733 Obstructive sleep apnea (adult) (pediatric): Secondary | ICD-10-CM | POA: Diagnosis not present

## 2022-11-19 DIAGNOSIS — Z9682 Presence of neurostimulator: Secondary | ICD-10-CM

## 2022-11-19 DIAGNOSIS — I341 Nonrheumatic mitral (valve) prolapse: Secondary | ICD-10-CM | POA: Diagnosis present

## 2022-11-19 DIAGNOSIS — F70 Mild intellectual disabilities: Secondary | ICD-10-CM | POA: Diagnosis present

## 2022-11-19 DIAGNOSIS — Z803 Family history of malignant neoplasm of breast: Secondary | ICD-10-CM

## 2022-11-19 DIAGNOSIS — G40319 Generalized idiopathic epilepsy and epileptic syndromes, intractable, without status epilepticus: Secondary | ICD-10-CM | POA: Diagnosis not present

## 2022-11-19 DIAGNOSIS — Z8249 Family history of ischemic heart disease and other diseases of the circulatory system: Secondary | ICD-10-CM

## 2022-11-19 DIAGNOSIS — E876 Hypokalemia: Secondary | ICD-10-CM | POA: Diagnosis present

## 2022-11-19 DIAGNOSIS — Z79899 Other long term (current) drug therapy: Secondary | ICD-10-CM

## 2022-11-19 DIAGNOSIS — Z881 Allergy status to other antibiotic agents status: Secondary | ICD-10-CM

## 2022-11-19 DIAGNOSIS — Z79890 Hormone replacement therapy (postmenopausal): Secondary | ICD-10-CM

## 2022-11-19 DIAGNOSIS — Z832 Family history of diseases of the blood and blood-forming organs and certain disorders involving the immune mechanism: Secondary | ICD-10-CM

## 2022-11-19 DIAGNOSIS — R569 Unspecified convulsions: Secondary | ICD-10-CM

## 2022-11-19 DIAGNOSIS — Z888 Allergy status to other drugs, medicaments and biological substances status: Secondary | ICD-10-CM

## 2022-11-19 LAB — COMPREHENSIVE METABOLIC PANEL
ALT: 25 U/L (ref 0–44)
AST: 26 U/L (ref 15–41)
Albumin: 3.7 g/dL (ref 3.5–5.0)
Alkaline Phosphatase: 76 U/L (ref 38–126)
Anion gap: 11 (ref 5–15)
BUN: 11 mg/dL (ref 6–20)
CO2: 20 mmol/L — ABNORMAL LOW (ref 22–32)
Calcium: 8.7 mg/dL — ABNORMAL LOW (ref 8.9–10.3)
Chloride: 105 mmol/L (ref 98–111)
Creatinine, Ser: 0.94 mg/dL (ref 0.61–1.24)
GFR, Estimated: >60 mL/min (ref >60)
Glucose, Bld: 94 mg/dL (ref 70–99)
Potassium: 3.3 mmol/L — ABNORMAL LOW (ref 3.5–5.1)
Sodium: 136 mmol/L (ref 135–145)
Total Bilirubin: 0.7 mg/dL (ref 0.3–1.2)
Total Protein: 7 g/dL (ref 6.5–8.1)

## 2022-11-19 LAB — CBC WITH DIFFERENTIAL/PLATELET
Abs Immature Granulocytes: 0.02 10*3/uL (ref 0.00–0.07)
Basophils Absolute: 0 10*3/uL (ref 0.0–0.1)
Basophils Relative: 1 %
Eosinophils Absolute: 0 10*3/uL (ref 0.0–0.5)
Eosinophils Relative: 1 %
HCT: 40.4 % (ref 39.0–52.0)
Hemoglobin: 13.8 g/dL (ref 13.0–17.0)
Immature Granulocytes: 1 %
Lymphocytes Relative: 46 %
Lymphs Abs: 2.1 10*3/uL (ref 0.7–4.0)
MCH: 28.9 pg (ref 26.0–34.0)
MCHC: 34.2 g/dL (ref 30.0–36.0)
MCV: 84.7 fL (ref 80.0–100.0)
Monocytes Absolute: 0.3 10*3/uL (ref 0.1–1.0)
Monocytes Relative: 6 %
Neutro Abs: 2 10*3/uL (ref 1.7–7.7)
Neutrophils Relative %: 45 %
Platelets: 258 10*3/uL (ref 150–400)
RBC: 4.77 MIL/uL (ref 4.22–5.81)
RDW: 12.9 % (ref 11.5–15.5)
WBC: 4.4 10*3/uL (ref 4.0–10.5)
nRBC: 0 % (ref 0.0–0.2)

## 2022-11-19 LAB — URINALYSIS, ROUTINE W REFLEX MICROSCOPIC
Bacteria, UA: NONE SEEN
Bilirubin Urine: NEGATIVE
Glucose, UA: NEGATIVE mg/dL
Hgb urine dipstick: NEGATIVE
Ketones, ur: NEGATIVE mg/dL
Leukocytes,Ua: NEGATIVE
Nitrite: NEGATIVE
Protein, ur: NEGATIVE mg/dL
Specific Gravity, Urine: 1.005 (ref 1.005–1.030)
pH: 7 (ref 5.0–8.0)

## 2022-11-19 LAB — CBG MONITORING, ED: Glucose-Capillary: 89 mg/dL (ref 70–99)

## 2022-11-19 LAB — PHENOBARBITAL LEVEL: Phenobarbital: <5 ug/mL — ABNORMAL LOW (ref 15.0–40.0)

## 2022-11-19 MED ORDER — ONDANSETRON HCL 4 MG PO TABS: 4.0000 mg | ORAL_TABLET | Freq: Four times a day (QID) | ORAL | Status: DC | PRN

## 2022-11-19 MED ORDER — TOPIRAMATE ER 200 MG PO CAP24
200.0000 mg | ORAL_CAPSULE | Freq: Every day | ORAL | Status: DC
  Administered 2022-11-19 – 2022-11-20 (×2): 200 mg via ORAL
  Filled 2022-11-19 (×4): qty 1

## 2022-11-19 MED ORDER — TOPIRAMATE ER 50 MG PO CAP24
50.0000 mg | ORAL_CAPSULE | Freq: Every day | ORAL | Status: DC
  Administered 2022-11-19 – 2022-11-20 (×2): 50 mg via ORAL
  Filled 2022-11-19 (×3): qty 1

## 2022-11-19 MED ORDER — TOPIRAMATE 25 MG PO TABS
250.0000 mg | ORAL_TABLET | Freq: Every day | ORAL | Status: DC
  Filled 2022-11-19: qty 10

## 2022-11-19 MED ORDER — ONDANSETRON HCL 4 MG/2ML IJ SOLN: 4.0000 mg | Freq: Four times a day (QID) | INTRAMUSCULAR | Status: DC | PRN

## 2022-11-19 MED ORDER — LACOSAMIDE 50 MG PO TABS
200.0000 mg | ORAL_TABLET | Freq: Every day | ORAL | Status: DC
  Administered 2022-11-19 – 2022-11-20 (×2): 200 mg via ORAL
  Filled 2022-11-19 (×2): qty 4

## 2022-11-19 MED ORDER — CLOBAZAM 10 MG PO TABS
10.0000 mg | ORAL_TABLET | Freq: Two times a day (BID) | ORAL | Status: DC
  Administered 2022-11-20 – 2022-11-21 (×4): 10 mg via ORAL
  Filled 2022-11-19 (×4): qty 1

## 2022-11-19 MED ORDER — CLOBAZAM 10 MG PO TABS
20.0000 mg | ORAL_TABLET | Freq: Every day | ORAL | Status: DC
  Administered 2022-11-19 – 2022-11-20 (×2): 20 mg via ORAL
  Filled 2022-11-19: qty 2

## 2022-11-19 MED ORDER — ACETAMINOPHEN 325 MG PO TABS: 650.0000 mg | ORAL_TABLET | Freq: Four times a day (QID) | ORAL | Status: DC | PRN

## 2022-11-19 MED ORDER — STERILE WATER FOR INJECTION IJ SOLN
INTRAMUSCULAR | Status: AC
  Filled 2022-11-19: qty 10

## 2022-11-19 MED ORDER — ACETAMINOPHEN 650 MG RE SUPP: 650.0000 mg | Freq: Four times a day (QID) | RECTAL | Status: DC | PRN

## 2022-11-19 MED ORDER — POTASSIUM CHLORIDE CRYS ER 20 MEQ PO TBCR
40.0000 meq | EXTENDED_RELEASE_TABLET | Freq: Once | ORAL | Status: AC
  Administered 2022-11-20: 40 meq via ORAL
  Filled 2022-11-19: qty 2

## 2022-11-19 MED ORDER — TOPIRAMATE ER 25 MG PO SPRINKLE CAP24
250.0000 mg | EXTENDED_RELEASE_CAPSULE | Freq: Every day | ORAL | Status: DC
  Filled 2022-11-19 (×2): qty 2

## 2022-11-19 MED ORDER — PERAMPANEL 8 MG PO TABS
8.0000 mg | ORAL_TABLET | Freq: Once | ORAL | Status: AC
  Administered 2022-11-19: 8 mg via ORAL
  Filled 2022-11-19: qty 1

## 2022-11-19 MED ORDER — CARBAMAZEPINE 100 MG PO CHEW
150.0000 mg | CHEWABLE_TABLET | Freq: Three times a day (TID) | ORAL | Status: DC
  Administered 2022-11-19 – 2022-11-20 (×2): 150 mg via ORAL
  Filled 2022-11-19 (×5): qty 1.5

## 2022-11-19 MED ORDER — LACOSAMIDE 50 MG PO TABS
100.0000 mg | ORAL_TABLET | Freq: Every morning | ORAL | Status: DC
  Administered 2022-11-20 – 2022-11-21 (×2): 100 mg via ORAL
  Filled 2022-11-19 (×2): qty 2

## 2022-11-19 MED ORDER — LEVETIRACETAM 750 MG PO TABS
1250.0000 mg | ORAL_TABLET | Freq: Two times a day (BID) | ORAL | Status: DC
  Administered 2022-11-19 – 2022-11-21 (×4): 1250 mg via ORAL
  Filled 2022-11-19 (×5): qty 1

## 2022-11-19 NOTE — Assessment & Plan Note (Signed)
Stable. Continue 50 mcg synthroid.

## 2022-11-19 NOTE — H&P (Signed)
History and Physical    [name] [id number] DOB: [birth year] DOA: 11/19/2022  DOS: the patient was seen and examined on 11/19/2022  PCP: Chesley Noon, MD   Patient coming from: Home  I have personally briefly reviewed patient's old medical records in [place]  CC: seizures HPI: 37 year old male with a history of epilepsy status post vagal nerve stimulator, chronic migraines, hypothyroidism, OSA on CPAP, prior history of DVT off anticoagulation, presents to the ER today with recurrent breakthrough seizures.  Patient has a long history of seizures.  He is followed extensively at [facility] by neurology and also local neurologist.  Mother states the patient had a upper respiratory illness last week.  She has been giving him NyQuil which contains acetaminophen dextromethorphan and doxylamine.  She did not know that doxylamine was an antihistamine.  She states that the patient has intolerances to most antihistamines including Benadryl which causes him to have a lower seizure threshold.  He can take Zyrtec without difficulty.  Patient was taking NyQuil on Wednesday Thursday and Friday of last week.  He started having seizures on Monday and Tuesday and today.  He had 2 seizures on Monday, 2 seizures on Tuesday, 2 seizures on Wednesday.  He fell and hit his face today.  She has been giving him IV Ativan and IV phenobarbital via his [name] which she accessed at home.  Workup here in the ER only showed mild hypokalemia with a potassium 3.3.  Patient had a 4 mm thick hypodense subdural collection of the left cerebral complex without mass effect or midline shift likely subacute to chronic.  This was compared with CT from January 2017.   Neurology has been consulted.  [facility] contacted for admission.   ED Course: CT head showed 4 mm subacute/chronic fluid collection  Review of Systems:  Review of Systems  Constitutional: Negative.    HENT:  Positive for congestion.   Eyes: Negative.   Respiratory: Negative.    Cardiovascular: Negative.   Gastrointestinal: Negative.   Genitourinary: Negative.   Musculoskeletal: Negative.   Skin: Negative.   Neurological:  Positive for seizures.  Psychiatric/Behavioral: Negative.    All other systems reviewed and are negative.   Past Medical History:  Diagnosis Date   Afib ([place])    only per portocath removal.   ALLERGIC RHINITIS 12/26/2009   Atrial fibrillation ([place]) 04/08/2012   Formatting of this note might be different from the original.  Last Assessment & Plan:   One episode during a procedure in July. Likely induced by the wire. No recurrence. He is in normal sinus rhythm today. No further workup or treatment.   Overview:   Paroxysmal - occurred during a procedure - has been anticoagulated with Coumadin but initiated due to DVT  Formatting of this note might be differ   Blood clot in vein 2015   Brown's syndrome    Complication of anesthesia    seizure after pac surgery at [place] 2013 had to be intubated, no other anes issurs   Growth disorder    Headache    migraines   Heart murmur    moderate MR ses dr Darcus Pester   Hyperlipidemia    No therapy   Hypokalemia    Mental retardation, mild (I.Q. 50-70)    MITRAL VALVE PROLAPSE 06/22/2007   SEIZURE DISORDER 06/22/2007   last seizure 08-02-2019   Sleep apnea    cpap , last sleep study 10/01/11   Unspecified hypothyroidism 06/22/2007  Past Surgical History:  Procedure Laterality Date   ANKLE SURGERY Left    fused does not bend, wears right leg brace 2018 at [place]  08/22/2019   Procedure: BIOPSY;  Surgeon: Mauri Pole, MD;  Location: [facility];  Service: Endoscopy;;   BIOPSY  10/02/2022   Procedure: BIOPSY;  Surgeon: Mauri Pole, MD;  Location: [facility];  Service: Gastroenterology;;   CATARACT EXTRACTION     2021   COLONOSCOPY WITH PROPOFOL N/A 08/22/2019   Procedure: COLONOSCOPY WITH  PROPOFOL;  Surgeon: Mauri Pole, MD;  Location: [facility];  Service: Endoscopy;  Laterality: N/A;   COLONOSCOPY WITH PROPOFOL N/A 10/02/2022   Procedure: COLONOSCOPY WITH PROPOFOL;  Surgeon: Mauri Pole, MD;  Location: [facility];  Service: Gastroenterology;  Laterality: N/A;   EYE SURGERY     X2 for Brown Syndrome   HEMOSTASIS CLIP PLACEMENT  08/22/2019   Procedure: HEMOSTASIS CLIP PLACEMENT;  Surgeon: Mauri Pole, MD;  Location: [facility];  Service: Endoscopy;;   IMPLANTATION VAGAL NERVE STIMULATOR     Left leaves on for surgeries, new battery 2022, first put in 2005.   POLYPECTOMY  08/22/2019   Procedure: POLYPECTOMY;  Surgeon: Mauri Pole, MD;  Location: [facility];  Service: Endoscopy;;   PORTACATH PLACEMENT  2013 and removed and new placed   right chest     reports that he has never smoked. He has never been exposed to tobacco smoke. He has never used smokeless tobacco. He reports that he does not drink alcohol and does not use drugs.  Allergies  Allergen Reactions   Antihistamines, Chlorpheniramine-Type Other (See Comments)    Other Reaction: Dimetapp causes seizures   Divalproex Sodium Other (See Comments)    Nausea, vomiting, liver and kidney dysfunction   Phenytoin Rash and Other (See Comments)    Dilantin causes more seizures.    Valproic Acid And Related Other (See Comments)    Shuts down systems   Vancomycin Rash, Other (See Comments) and Swelling    If given too fast, causes red man reaction Other Reaction: Redman's Syndrome   Antihistamines, Diphenhydramine-Type Other (See Comments)    seizures    Family History  Problem Relation Age of Onset   Breast cancer Mother    Colon polyps Mother    Clotting disorder Mother    Diabetes Father    Hypertension Father    Colon polyps Father    Hyperlipidemia Father    Atrial fibrillation Father    Diabetes Maternal Grandmother    Heart disease Maternal Grandmother    Diabetes  Maternal Grandfather    Heart disease Maternal Grandfather    Coronary artery disease Maternal Grandfather    Congestive Heart Failure Maternal Grandfather        Died at 59   Pneumonia Maternal Grandfather    Diabetes Paternal Grandmother    Heart disease Paternal Grandmother    Pneumonia Paternal Grandmother        Died at 15   Stroke Paternal Grandmother    Diabetes Paternal Grandfather    Heart disease Paternal Grandfather    Coronary artery disease Paternal Grandfather    Lung cancer Paternal Grandfather        Died at 76    Prior to Admission medications   Medication Sig Start Date End Date Taking? Authorizing Provider  acetaminophen (TYLENOL) 500 MG tablet Take 1,000 mg by mouth every 6 (six) hours as needed for headache. Patient not taking: Reported  on 11/04/2022    [provider]  albuterol (ACCUNEB) 1.25 MG/3ML nebulizer solution Take 1 ampule by nebulization every 4 (four) hours as needed for wheezing.    [provider]  Calcium-Vitamin D-Vitamin K (VIACTIV CALCIUM PLUS D) 650-12.5-40 MG-MCG-MCG CHEW Chew 1 each by mouth daily.    [provider]  carbamazepine (TEGRETOL) 100 MG chewable tablet CHEW 2 TABLETS IN THE MORNING, 2 TABLETS AT MIDDAY AND 1 & 1/2 TABLETS IN THE EVENING Patient taking differently: Chew 150 mg by mouth 3 (three) times daily. CHEW 1 1/2 TABLETS IN THE MORNING, 1 1/2 TABLETS AT MIDDAY AND 1 & 1/2 TABLETS IN THE EVENING 03/06/21   Jodi Geralds, MD  cetirizine (ZYRTEC) 10 MG tablet Take 10 mg by mouth daily.    [provider]  clotrimazole (LOTRIMIN) 1 % cream Apply 1 Application topically 2 (two) times daily as needed (irritation). Patient not taking: Reported on 11/04/2022    [provider]  Flaxseed, Linseed, (FLAXSEED OIL) 1000 MG CAPS Take 1,000 mg by mouth daily.    [provider]  hydrocortisone (ANUSOL-HC) 25 MG suppository Place 1 suppository (25 mg total) rectally every 12 (twelve)  hours. Patient not taking: Reported on 11/04/2022 07/21/22   Zehr, Janett Billow D, PA-C  K-PHOS 500 MG tablet Take 1 tablet (500 mg total) by mouth 4 (four) times daily. 06/25/22   Rockwell Germany, NP  levETIRAcetam (KEPPRA) 500 MG tablet Take 1,000-1,250 mg by mouth in the morning and at bedtime. 1,000 mg (2 tabs) in the morning & 1,250 mg (2.5 tabs) at night    [provider]  levothyroxine (SYNTHROID) 50 MCG tablet Take 50 mcg by mouth daily before breakfast.    [provider]  LORazepam (ATIVAN) 2 MG/ML injection Inject 1 mL (2 mg total) into the vein See admin instructions. Dilute '2mg'$  of lorazepam with 46m of saline.  Administer IV push over 3-5 minutes as needed for seizures. Patient not taking: Reported on 11/04/2022 10/01/22   GRockwell Germany NP  mesalamine (CANASA) 1000 MG suppository Place 1 suppository (1,000 mg total) rectally at bedtime for 14 days. 10/02/22 10/16/22  NMauri Pole MD  montelukast (SINGULAIR) 10 MG tablet Take 10 mg by mouth at bedtime. 02/05/18   [provider]  Multiple Vitamin (MULTIVITAMIN WITH MINERALS) TABS Take 1 tablet by mouth daily.    [provider]  Omega-3 Fatty Acids (FISH OIL) 500 MG CAPS Take 500 mg by mouth daily.    [provider]  ondansetron (ZOFRAN-ODT) 8 MG disintegrating tablet Take 8 mg by mouth every 8 (eight) hours. Patient not taking: Reported on 11/04/2022 09/04/22   [provider]  [name] 10 MG tablet TAKE 1 TABLET BY MOUTH IN THE MORNING, 1 IN THE AFTERNOON AND 2 AT BEDTIME Patient taking differently: Take 10-20 mg by mouth in the morning, at noon, and at bedtime. 10 mg in the morning, 10 mg mid-afternoon ( 330p) 20 mg at bedtime 06/04/21   GRockwell Germany NP  PHENObarbital (LUMINAL) 65 MG/ML injection Withdraw 12m('65mg'$ ) Phenobarbital and 19m38mormal Saline into 5ml63mringe. [place] IV push for 3-5 minutes as needed for recurrent seizures Patient not taking: Reported on 11/04/2022  10/01/22   GoodRockwell Germany  potassium chloride SA (KLOR-CON M20) 20 MEQ tablet TAKE 1 TABLET BY MOUTH EVERY DAY 11/05/22   GoodRockwell Germany  promethazine (PHENERGAN) 25 MG suppository Place 25 mg rectally every 6 (six) hours as needed for nausea or vomiting.  Patient not taking: Reported on 11/04/2022 07/04/19   [provider]  rosuvastatin (CRESTOR) 10 MG tablet Take 10 mg by mouth at bedtime. 05/26/16   [provider]  Topiramate ER (TROKENDI XR) 200 MG CP24 Take 1 capsule by mouth at bedtime. 05/29/21   Jodi Geralds, MD  TROKENDI XR 50 MG CP24 Take 1 capsule at nighttime 03/06/21   Hickling, Princess Bruins, MD  VIMPAT 100 MG TABS TAKE 1 TABLET IN THE MORNING AND TAKE 2 TABLETS AT NIGHT Patient taking differently: Take 100-200 mg by mouth in the morning and at bedtime. 100 mg in the morning & 200 mg at bedtime 10/21/21   Rockwell Germany, NP    Physical Exam: Vitals:   11/19/22 1734 11/19/22 1800 11/19/22 1855 11/19/22 2119  BP: (!) 96/53 99/74 110/74 96/68  Pulse: 88 85 94 81  Resp: 16 (!) '21 18 20  '$ Temp: 98.7 F (37.1 C)     TempSrc: Oral     SpO2: 100% 98% 98% 98%  Weight:      Height:        Physical Exam Vitals and nursing note reviewed.  HENT:     Head: Normocephalic and atraumatic.     Nose: Nose normal.  Cardiovascular:     Rate and Rhythm: Normal rate and regular rhythm.     Pulses: Normal pulses.  Pulmonary:     Effort: Pulmonary effort is normal.     Breath sounds: Normal breath sounds.  Abdominal:     General: Bowel sounds are normal. There is no distension.  Skin:    General: Skin is warm and dry.     Capillary Refill: Capillary refill takes less than 2 seconds.  Neurological:     Mental Status: He is alert and oriented to person, place, and time.      Labs on Admission: I have personally reviewed following labs and imaging studies  CBC: Recent Labs  Lab 11/19/22 1726  WBC 4.4  NEUTROABS 2.0  HGB 13.8  HCT 40.4  MCV 84.7   PLT 0000000   Basic Metabolic Panel: Recent Labs  Lab 11/19/22 1726  NA 136  K 3.3*  CL 105  CO2 20*  GLUCOSE 94  BUN 11  CREATININE 0.94  CALCIUM 8.7*   GFR: Estimated Creatinine Clearance: 87.2 mL/min (by C-G formula based on SCr of 0.94 mg/dL). Liver Function Tests: Recent Labs  Lab 11/19/22 1726  AST 26  ALT 25  ALKPHOS 76  BILITOT 0.7  PROT 7.0  ALBUMIN 3.7   No results for input(s): "LIPASE", "AMYLASE" in the last 168 hours. No results for input(s): "AMMONIA" in the last 168 hours. Coagulation Profile: No results for input(s): "INR", "PROTIME" in the last 168 hours. Cardiac Enzymes: No results for input(s): "CKTOTAL", "CKMB", "CKMBINDEX", "TROPONINI", "TROPONINIHS" in the last 168 hours. BNP (last 3 results) No results for input(s): "PROBNP" in the last 8760 hours. HbA1C: No results for input(s): "HGBA1C" in the last 72 hours. CBG: Recent Labs  Lab 11/19/22 1740  GLUCAP 89   Lipid Profile: No results for input(s): "CHOL", "HDL", "LDLCALC", "TRIG", "CHOLHDL", "LDLDIRECT" in the last 72 hours. Thyroid Function Tests: No results for input(s): "TSH", "T4TOTAL", "FREET4", "T3FREE", "THYROIDAB" in the last 72 hours. Anemia Panel: No results for input(s): "VITAMINB12", "FOLATE", "FERRITIN", "TIBC", "IRON", "RETICCTPCT" in the last 72 hours. Urine analysis:    Component Value Date/Time   COLORURINE STRAW (A) 11/19/2022 1726   APPEARANCEUR CLEAR 11/19/2022 1726   LABSPEC 1.005  11/19/2022 1726   PHURINE 7.0 11/19/2022 1726   GLUCOSEU NEGATIVE 11/19/2022 1726   HGBUR NEGATIVE 11/19/2022 1726   BILIRUBINUR NEGATIVE 11/19/2022 1726   KETONESUR NEGATIVE 11/19/2022 1726   PROTEINUR NEGATIVE 11/19/2022 1726   UROBILINOGEN 0.2 04/21/2014 2218   NITRITE NEGATIVE 11/19/2022 1726   LEUKOCYTESUR NEGATIVE 11/19/2022 1726    Radiological Exams on Admission: I have personally reviewed images CT Head Wo Contrast  Result Date: 11/19/2022 CLINICAL DATA:  Seizures. EXAM:  CT HEAD WITHOUT CONTRAST TECHNIQUE: Contiguous axial images were obtained from the base of the skull through the vertex without intravenous contrast. RADIATION DOSE REDUCTION: This exam was performed according to the departmental dose-optimization program which includes automated exposure control, adjustment of the mA and/or kV according to patient size and/or use of iterative reconstruction technique. 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 There is no mass effect on the underlying brain parenchyma or midline shift. There is no other evidence of acute intracranial hemorrhage or extra-axial fluid collection Parenchymal volume is normal. The ventricles are normal in size. Gray-white differentiation is preserved. 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 The sella appears small, unchanged. There is no evidence of suprasellar mass lesion. There is no mass effect or midline shift. Vascular: No hyperdense vessel or unexpected calcification. Skull: There is no calvarial fracture or suspicious osseous lesion. There are remote bilateral nasal bone fractures. Sinuses/Orbits: The paranasal sinuses are clear. Bilateral lens implants are in place. The globes and orbits are otherwise unremarkable. Other: None. IMPRESSION: 1. 4 mm thick hypodense subdural collection over the left cerebral convexity without mass effect or midline shift is likely subacute to chronic. No evidence of acute blood products. 2. Otherwise, no evidence of acute intracranial pathology. 3. Unchanged dense basal ganglia calcifications. Electronically Signed   By: Valetta Mole M.D.   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 Mom is very sure that the pt's recent ingestion of Nyquil(which contains Doxylamine) is the cause of his seizures. Pt has intolerance to benadryl as it causes it to lower his seizure threshold. Mom was concerned that she would not be able to give more phenobarbital or ativan if he had another seizure. Pt's phenobarbital level is undetectable and half-life of IV ativan is very short. I gave pt and his mother option of not being admitted to hospital but pt states he wants to stay in the hospital overnight for observation. Neurology to see patient in consult. Neurology to order pt's very complex AED regimen and also prn breakthrough seizure meds.  S/P placement of VNS (vagus nerve stimulation) device Stable.  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  Migraine without aura and without status migrainosus, not intractable Stable.  Hypothyroidism Stable. Continue 50 mcg synthroid.  OSA on CPAP Continue home CPAP.  Hypokalemia Supplement with oral kcl. Repeat BMP in AM. Check Mg  DVT prophylaxis: SCDs Code Status: Full Code Family Communication: discussed with pt and his mother (doris) at bedside Disposition Plan: return home  Consults called: neurology(Dr. Lorrin Goodell) Admission status: Observation, Med-Surg   Kristopher Oppenheim, DO [facility] 11/19/2022, 9:48 PM

## 2022-11-19 NOTE — Telephone Encounter (Signed)
Mom contacted me to report that [name] has had multiple seizures today despite accessing the port and giving him Ativan and Phenobarbital. He fell and hit his head on the floor with one of the seizures. He seems at baseline in between seizures. I recommended taking [name] to the ED since seizures continue to occur and since he had head injury today. Mom agreed. TG

## 2022-11-19 NOTE — ED Notes (Signed)
ED TO INPATIENT HANDOFF REPORT  ED Nurse Name and Phone #: Mosie Lukes   S Name/Age/Gender [name] 37 y.o. male Room/Bed: 015C/015C  Code Status   Code Status: Full Code  Home/SNF/Other Home Patient oriented to: self, place, and situation Is this baseline? 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 Pt has hx of seizures, multiple seizures since Monday, pt compliant with medication regimen, pt mother caregiver, reports partial seizures. Seizure today around 3pm, pt did hit head, abrasion to left forehead.    Allergies Allergies  Allergen Reactions   Antihistamines, Chlorpheniramine-Type Other (See Comments)    Other Reaction: Dimetapp causes seizures   Divalproex Sodium Other (See Comments)    Nausea, vomiting, liver and kidney dysfunction   Phenytoin Rash and Other (See Comments)    Dilantin causes more seizures.    Valproic Acid And Related Other (See Comments)    Shuts down systems   Vancomycin Rash, Other (See Comments) and Swelling    If given too fast, causes red man reaction Other Reaction: Redman's Syndrome   Antihistamines, Diphenhydramine-Type Other (See Comments)    seizures    Level of Care/Admitting Diagnosis ED Disposition     ED Disposition  Admit   Condition  --   [place]: [place] [[id number]]  Level of Care: Telemetry Medical [[id number]]  May place patient in observation at [facility] or [facility] if equivalent level of care is available:: No  Covid Evaluation: Asymptomatic - no recent exposure (last 10 days) testing not required  Diagnosis: 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   ALLERGIC RHINITIS 12/26/2009   Atrial fibrillation ([place]) 04/08/2012   Formatting of this note might be different from the original.  Last Assessment & Plan:   One episode during a procedure in July. Likely induced by the wire. No recurrence. He is in normal sinus rhythm today. No further workup or treatment.   Overview:   Paroxysmal - occurred during a procedure - has been anticoagulated with Coumadin but initiated due to DVT  Formatting of this note might be differ   Blood clot in vein 2015   Brown's syndrome    Complication of anesthesia    seizure after pac surgery at [place] 2013 had to be intubated, no other anes issurs   Growth disorder    Headache    migraines   Heart murmur    moderate MR ses dr Darcus Pester   Hyperlipidemia    No therapy   Hypokalemia    Mental retardation, mild (I.Q. 50-70)    MITRAL VALVE PROLAPSE 06/22/2007   SEIZURE DISORDER 06/22/2007   last seizure 08-02-2019   Sleep apnea    cpap , last sleep study 10/01/11   Unspecified hypothyroidism 06/22/2007   Past Surgical History:  Procedure Laterality Date   ANKLE SURGERY Left    fused does not bend, wears right leg brace 2018 at [place]  08/22/2019   Procedure: BIOPSY;  Surgeon: Mauri Pole, MD;  Location: [facility];  Service: Endoscopy;;   BIOPSY  10/02/2022   Procedure: BIOPSY;  Surgeon: Silverio Decamp,  Venia Minks, MD;  Location: [facility];  Service: Gastroenterology;;   CATARACT EXTRACTION     2021   COLONOSCOPY WITH PROPOFOL N/A 08/22/2019   Procedure: COLONOSCOPY WITH PROPOFOL;  Surgeon: Mauri Pole, MD;  Location: [facility];  Service: Endoscopy;  Laterality: N/A;   COLONOSCOPY WITH PROPOFOL N/A 10/02/2022   Procedure: COLONOSCOPY WITH PROPOFOL;  Surgeon: Mauri Pole, MD;  Location: [facility];  Service: Gastroenterology;  Laterality: N/A;   EYE SURGERY     X2 for Brown Syndrome   HEMOSTASIS CLIP PLACEMENT   08/22/2019   Procedure: HEMOSTASIS CLIP PLACEMENT;  Surgeon: Mauri Pole, MD;  Location: [facility];  Service: Endoscopy;;   IMPLANTATION VAGAL NERVE STIMULATOR     Left leaves on for surgeries, new battery 2022, first put in 2005.   POLYPECTOMY  08/22/2019   Procedure: POLYPECTOMY;  Surgeon: Mauri Pole, MD;  Location: [facility];  Service: Endoscopy;;   PORTACATH PLACEMENT  2013 and removed and new placed   right chest     A IV Location/Drains/Wounds Patient Lines/Drains/Airways Status     Active Line/Drains/Airways     Name Placement date Placement time Site Days   Implanted Port Right Chest --  --  Chest  --   Implanted Port 02/13/18 Right Chest 02/13/18  --  Chest  1740            Intake/Output Last 24 hours No intake or output data in the 24 hours ending 11/19/22 2201  Labs/Imaging Results for orders placed or performed during the hospital encounter of 11/19/22 (from the past 48 hour(s))  CBC with Differential     Status: None   Collection Time: 11/19/22  5:26 PM  Result Value Ref Range   WBC 4.4 4.0 - 10.5 K/uL   RBC 4.77 4.22 - 5.81 MIL/uL   Hemoglobin 13.8 13.0 - 17.0 g/dL   HCT 40.4 39.0 - 52.0 %   MCV 84.7 80.0 - 100.0 fL   MCH 28.9 26.0 - 34.0 pg   MCHC 34.2 30.0 - 36.0 g/dL   RDW 12.9 11.5 - 15.5 %   Platelets 258 150 - 400 K/uL   nRBC 0.0 0.0 - 0.2 %   Neutrophils Relative % 45 %   Neutro Abs 2.0 1.7 - 7.7 K/uL   Lymphocytes Relative 46 %   Lymphs Abs 2.1 0.7 - 4.0 K/uL   Monocytes Relative 6 %   Monocytes Absolute 0.3 0.1 - 1.0 K/uL   Eosinophils Relative 1 %   Eosinophils Absolute 0.0 0.0 - 0.5 K/uL   Basophils Relative 1 %   Basophils Absolute 0.0 0.0 - 0.1 K/uL   Immature Granulocytes 1 %   Abs Immature Granulocytes 0.02 0.00 - 0.07 K/uL    Comment: Performed at [facility], [address], [place], [place] [address]  Comprehensive metabolic panel     Status: Abnormal   Collection Time: 11/19/22  5:26 PM  Result  Value Ref Range   Sodium 136 135 - 145 mmol/L   Potassium 3.3 (L) 3.5 - 5.1 mmol/L   Chloride 105 98 - 111 mmol/L   CO2 20 (L) 22 - 32 mmol/L   Glucose, Bld 94 70 - 99 mg/dL    Comment: Glucose reference range applies only to samples taken after fasting for at least 8 hours.   BUN 11 6 - 20 mg/dL   Creatinine, Ser 0.94 0.61 - 1.24 mg/dL   Calcium 8.7 (L) 8.9 - 10.3 mg/dL  Total Protein 7.0 6.5 - 8.1 g/dL   Albumin 3.7 3.5 - 5.0 g/dL   AST 26 15 - 41 U/L   ALT 25 0 - 44 U/L   Alkaline Phosphatase 76 38 - 126 U/L   Total Bilirubin 0.7 0.3 - 1.2 mg/dL   GFR, Estimated >60 >60 mL/min    Comment: (NOTE) Calculated using the CKD-EPI Creatinine Equation (2021)    Anion gap 11 5 - 15    Comment: Performed at [facility] [address], [place], [place] [address]  Urinalysis, Routine w reflex microscopic -Urine, Clean Catch     Status: Abnormal   Collection Time: 11/19/22  5:26 PM  Result Value Ref Range   Color, Urine STRAW (A) YELLOW   APPearance CLEAR CLEAR   Specific Gravity, Urine 1.005 1.005 - 1.030   pH 7.0 5.0 - 8.0   Glucose, UA NEGATIVE NEGATIVE mg/dL   Hgb urine dipstick NEGATIVE NEGATIVE   Bilirubin Urine NEGATIVE NEGATIVE   Ketones, ur NEGATIVE NEGATIVE mg/dL   Protein, ur NEGATIVE NEGATIVE mg/dL   Nitrite NEGATIVE NEGATIVE   Leukocytes,Ua NEGATIVE NEGATIVE   RBC / HPF 0-5 0 - 5 RBC/hpf   WBC, UA 0-5 0 - 5 WBC/hpf   Bacteria, UA NONE SEEN NONE SEEN   Squamous Epithelial / HPF 0-5 0 - 5 /HPF    Comment: Performed at [facility], [place] [address], [place], [place] [address]  CBG monitoring, ED     Status: None   Collection Time: 11/19/22  5:40 PM  Result Value Ref Range   Glucose-Capillary 89 70 - 99 mg/dL    Comment: Glucose reference range applies only to samples taken after fasting for at least 8 hours.  Phenobarbital level     Status: Abnormal   Collection Time: 11/19/22  7:32 PM  Result Value Ref Range   Phenobarbital <5.0 (L) 15.0 - 40.0 ug/mL     Comment: Performed at [facility] [address] [place], [place] [address]   CT Head Wo Contrast  Result Date: 11/19/2022 CLINICAL DATA:  Seizures. EXAM: CT HEAD WITHOUT CONTRAST TECHNIQUE: Contiguous axial images were obtained from the base of the skull through the vertex without intravenous contrast. RADIATION DOSE REDUCTION: This exam was performed according to the departmental dose-optimization program which includes automated exposure control, adjustment of the mA and/or kV according to patient size and/or use of iterative reconstruction technique. 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 There is no mass effect on the underlying brain parenchyma or midline shift. There is no other evidence of acute intracranial hemorrhage or extra-axial fluid collection Parenchymal volume is normal. The ventricles are normal in size. Gray-white differentiation is preserved. 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 The sella appears small, unchanged. There is no evidence of suprasellar mass lesion. There is no mass effect or midline shift. Vascular: No hyperdense vessel or unexpected calcification. Skull: There is no calvarial fracture or suspicious osseous lesion. There are remote bilateral nasal bone fractures. Sinuses/Orbits: The paranasal sinuses are clear. Bilateral lens implants are in place. The globes and orbits are otherwise unremarkable. Other: None. IMPRESSION: 1. 4 mm thick hypodense subdural collection over the left cerebral convexity without mass effect or midline shift is likely subacute to chronic. No evidence  of acute blood products. 2. Otherwise, no evidence of acute intracranial pathology. 3. Unchanged dense basal ganglia calcifications. Electronically  Signed   By: Valetta Mole M.D.   On: 11/19/2022 18:46    Pending Labs Unresulted Labs (From admission, onward)     Start     Ordered   11/19/22 2000  Levetiracetam level  Once,   R        11/19/22 2000   Signed and Held  Comprehensive metabolic panel  Tomorrow morning,   R        Signed and Held   Signed and Held  CBC with Differential/Platelet  Tomorrow morning,   R        Signed and Held   Signed and Held  Magnesium  Tomorrow morning,   R        Signed and Held            Vitals/Pain Today's Vitals   11/19/22 1855 11/19/22 1933 11/19/22 2116 11/19/22 2119  BP: 110/74   96/68  Pulse: 94   81  Resp: 18   20  Temp:      TempSrc:      SpO2: 98%   98%  Weight:      Height:      PainSc:  0-No pain 0-No pain     Isolation Precautions No active isolations  Medications Medications  levETIRAcetam (KEPPRA) tablet 1,250 mg (1,250 mg Oral Given 11/19/22 2142)  lacosamide (VIMPAT) tablet 100 mg (has no administration in time range)    And  lacosamide (VIMPAT) tablet 200 mg (200 mg Oral Given 11/19/22 2142)  cloBAZam ([name]) tablet 10 mg (has no administration in time range)    And  cloBAZam ([name]) tablet 20 mg (has no administration in time range)  carbamazepine (TEGRETOL) chewable tablet 150 mg (has no administration in time range)  sterile water (preservative free) injection (  Not Given 11/19/22 2143)  topiramate ER (QUDEXY XR) capsule 250 mg (has no administration in time range)  potassium chloride SA (KLOR-CON M) CR tablet 40 mEq (has no administration in time range)  perampanel [facility]) tablet 8 mg (8 mg Oral Given 11/19/22 2115)    Mobility walks     Focused Assessments Neuro Assessment Handoff:  Swallow screen pass? Yes          Neuro Assessment:   Neuro Checks:      Has TPA been given? No If patient is a Neuro Trauma and patient is going to OR before floor call report to [place] nurse: [phone] or [phone]   R Recommendations: See Admitting  Provider Note  Report given to:   Additional Notes: Mother at bedside.

## 2022-11-19 NOTE — ED Notes (Signed)
Wasted 50 mg of tegretol with Cat RN, received 200 mg from pharmacy and patient had ordered dose of '150mg'$ 

## 2022-11-19 NOTE — ED Notes (Signed)
Requested ordered medication from pharmacy

## 2022-11-19 NOTE — Assessment & Plan Note (Signed)
Supplement with oral kcl. Repeat BMP in AM. Check Mg

## 2022-11-19 NOTE — Assessment & Plan Note (Signed)
Continue home CPAP ?

## 2022-11-19 NOTE — ED Triage Notes (Signed)
Pt to ED c/o seizures . Pt has hx of seizures, multiple seizures since Monday, pt compliant with medication regimen, pt mother caregiver, reports partial seizures. Seizure today around 3pm, pt did hit head, abrasion to left forehead.

## 2022-11-19 NOTE — Assessment & Plan Note (Signed)
Observation med/tele bed. Mom is very sure that the pt's recent ingestion of Nyquil(which contains Doxylamine) is the cause of his seizures. Pt has intolerance to benadryl as it causes it to lower his seizure threshold. Mom was concerned that she would not be able to give more phenobarbital or ativan if he had another seizure. Pt's phenobarbital level is undetectable and half-life of IV ativan is very short. I gave pt and his mother option of not being admitted to hospital but pt states he wants to stay in the hospital overnight for observation. Neurology to see patient in consult. Neurology to order pt's very complex AED regimen and also prn breakthrough seizure meds.

## 2022-11-19 NOTE — Consult Note (Signed)
NEUROLOGY CONSULTATION NOTE   Date of service: November 19, 2022 Patient Name: [name] MRN:  [id number] DOB:  [birth year] Reason for consult: "multiple breakthrough seizures" Requesting Provider: Kristopher Oppenheim, DO _ _ _   _ __   _ __ _ _  __ __   _ __   __ _  History of Present Illness  [name] is a 37 y.o. 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  Patient's mom is at bedside and provides most of the history.  Patient follows with pediatric neurology and is seen at both [facility] but also with a local pediatric neurologist.  Patient has tonic-clonic seizures, tonic seizures and absence seizure's.  Mom reports that patient typically gets anywhere from 2 seizures a week to about 2 seizures a month.  Mom reports that on Monday, patient had 2 GTC seizures, 2 GTC seizures on Tuesday and 2 GTC seizures on Wednesday.  She reports that he fell and hit his head during the second seizure that he had on Wednesday and over the last few hours has had over 12 absence seizure's.  She reports that she gave him a rescue Ativan and phenobarb through his [name] on Monday and then again on Tuesday.  Given persistent seizures, she brought him to the ED.  Mom does feel that these could possibly be triggered by antihistamines. Patient used Nyquil and Dayquil twice a day over the last 2 days and has had intolerance and ?seizures in the past with antihistamines. However, takes cetrizine without any issues in the past.  Patient is compliant with his AEDs and has not missed any doses.  No fevers, no signs or symptoms of infection, no URI-like symptoms over the last few days, no UTI-like symptoms over the last few days and no symptoms of gastroenteritis.    ROS   Unable to obtain detailed review of system due to  developmental delay.  However mom is at the bedside and denies any signs or symptoms of infection, no obvious pain that the patient reports.  Past History   Past Medical History:  Diagnosis Date   Afib ([place])    only per portocath removal.   ALLERGIC RHINITIS 12/26/2009   Atrial fibrillation ([place]) 04/08/2012   Formatting of this note might be different from the original.  Last Assessment & Plan:   One episode during a procedure in July. Likely induced by the wire. No recurrence. He is in normal sinus rhythm today. No further workup or treatment.   Overview:   Paroxysmal - occurred during a procedure - has been anticoagulated with Coumadin but initiated due to DVT  Formatting of this note might be differ   Blood clot in vein 2015   Brown's syndrome    Complication of anesthesia    seizure after pac surgery at [place] 2013 had to be intubated, no other anes issurs   Growth disorder    Headache    migraines   Heart murmur    moderate MR ses dr Darcus Pester   Hyperlipidemia    No therapy   Hypokalemia    Mental retardation, mild (I.Q. 50-70)    MITRAL VALVE PROLAPSE 06/22/2007   SEIZURE DISORDER 06/22/2007   last seizure 08-02-2019   Sleep apnea    cpap , last sleep study 10/01/11   Unspecified hypothyroidism 06/22/2007   Past Surgical  History:  Procedure Laterality Date   ANKLE SURGERY Left    fused does not bend, wears right leg brace 2018 at [place]  08/22/2019   Procedure: BIOPSY;  Surgeon: Mauri Pole, MD;  Location: [facility];  Service: Endoscopy;;   BIOPSY  10/02/2022   Procedure: BIOPSY;  Surgeon: Mauri Pole, MD;  Location: [facility];  Service: Gastroenterology;;   CATARACT EXTRACTION     2021   COLONOSCOPY WITH PROPOFOL N/A 08/22/2019   Procedure: COLONOSCOPY WITH PROPOFOL;  Surgeon: Mauri Pole, MD;  Location: [facility];  Service: Endoscopy;  Laterality: N/A;   COLONOSCOPY WITH PROPOFOL N/A 10/02/2022   Procedure: COLONOSCOPY WITH  PROPOFOL;  Surgeon: Mauri Pole, MD;  Location: [facility];  Service: Gastroenterology;  Laterality: N/A;   EYE SURGERY     X2 for Brown Syndrome   HEMOSTASIS CLIP PLACEMENT  08/22/2019   Procedure: HEMOSTASIS CLIP PLACEMENT;  Surgeon: Mauri Pole, MD;  Location: [facility];  Service: Endoscopy;;   IMPLANTATION VAGAL NERVE STIMULATOR     Left leaves on for surgeries, new battery 2022, first put in 2005.   POLYPECTOMY  08/22/2019   Procedure: POLYPECTOMY;  Surgeon: Mauri Pole, MD;  Location: [facility];  Service: Endoscopy;;   PORTACATH PLACEMENT  2013 and removed and new placed   right chest   Family History  Problem Relation Age of Onset   Breast cancer Mother    Colon polyps Mother    Clotting disorder Mother    Diabetes Father    Hypertension Father    Colon polyps Father    Hyperlipidemia Father    Atrial fibrillation Father    Diabetes Maternal Grandmother    Heart disease Maternal Grandmother    Diabetes Maternal Grandfather    Heart disease Maternal Grandfather    Coronary artery disease Maternal Grandfather    Congestive Heart Failure Maternal Grandfather        Died at 95   Pneumonia Maternal Grandfather    Diabetes Paternal Grandmother    Heart disease Paternal Grandmother    Pneumonia Paternal Grandmother        Died at 31   Stroke Paternal Grandmother    Diabetes Paternal Grandfather    Heart disease Paternal Grandfather    Coronary artery disease Paternal Grandfather    Lung cancer Paternal Grandfather        Died at 78   Social History   Socioeconomic History   Marital status: Single    Spouse name: Not on file   Number of children: 0   Years of education: Not on file   Highest education level: Not on file  Occupational History   Occupation: Disabled    Employer: UNEMPLOYED  Tobacco Use   Smoking status: Never    Passive exposure: Never   Smokeless tobacco: Never  Vaping Use   Vaping Use: Never used  Substance and  Sexual Activity   Alcohol use: No    Alcohol/week: 0.0 standard drinks of alcohol   Drug use: No   Sexual activity: Never  Other Topics Concern   Not on file  Social History Narrative   [place]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 Disabled.    He lives with his parents.    He enjoys helping around the house, Nascar, and football.   Caffiene soda 's with caffeien 2-3 / day   Social Determinants of  Health   Financial Resource Strain: Not on file  Food Insecurity: No Food Insecurity (06/01/2022)   Hunger Vital Sign    Worried About Running Out of Food in the Last Year: Never true    Ran Out of Food in the Last Year: Never true  Transportation Needs: No Transportation Needs (06/01/2022)   PRAPARE - Hydrologist (Medical): No    Lack of Transportation (Non-Medical): No  Physical Activity: Not on file  Stress: Not on file  Social Connections: Not on file   Allergies  Allergen Reactions   Antihistamines, Chlorpheniramine-Type Other (See Comments)    Other Reaction: Dimetapp causes seizures   Divalproex Sodium Other (See Comments)    Nausea, vomiting, liver and kidney dysfunction   Phenytoin Rash and Other (See Comments)    Dilantin causes more seizures.    Valproic Acid And Related Other (See Comments)    Shuts down systems   Vancomycin Rash, Other (See Comments) and Swelling    If given too fast, causes red man reaction Other Reaction: Redman's Syndrome   Antihistamines, Diphenhydramine-Type Other (See Comments)    seizures    Medications   Medications Prior to Admission  Medication Sig Dispense Refill Last Dose   albuterol (ACCUNEB) 1.25 MG/3ML nebulizer solution Take 1 ampule by nebulization every 4 (four) hours as needed for wheezing.      Calcium-Vitamin D-Vitamin K (VIACTIV CALCIUM PLUS D) 650-12.5-40 MG-MCG-MCG CHEW Chew 1 each by mouth daily.      carbamazepine (TEGRETOL) 100 MG  chewable tablet CHEW 2 TABLETS IN THE MORNING, 2 TABLETS AT MIDDAY AND 1 & 1/2 TABLETS IN THE EVENING (Patient taking differently: Chew 150 mg by mouth 3 (three) times daily. CHEW 1 1/2 TABLETS IN THE MORNING, 1 1/2 TABLETS AT MIDDAY AND 1 & 1/2 TABLETS IN THE EVENING) 170 tablet 5    cetirizine (ZYRTEC) 10 MG tablet Take 10 mg by mouth daily.      Flaxseed, Linseed, (FLAXSEED OIL) 1000 MG CAPS Take 1,000 mg by mouth daily.      K-PHOS 500 MG tablet Take 1 tablet (500 mg total) by mouth 4 (four) times daily. 124 tablet 3    levETIRAcetam (KEPPRA) 500 MG tablet Take 1,000-1,250 mg by mouth in the morning and at bedtime. 1,000 mg (2 tabs) in the morning & 1,250 mg (2.5 tabs) at night      levothyroxine (SYNTHROID) 50 MCG tablet Take 50 mcg by mouth daily before breakfast.      LORazepam (ATIVAN) 2 MG/ML injection Inject 1 mL (2 mg total) into the vein See admin instructions. Dilute '2mg'$  of lorazepam with 18m of saline.  Administer IV push over 3-5 minutes as needed for seizures. (Patient not taking: Reported on 11/04/2022) 1 mL 5    mesalamine (CANASA) 1000 MG suppository Place 1 suppository (1,000 mg total) rectally at bedtime for 14 days. 14 suppository 0    montelukast (SINGULAIR) 10 MG tablet Take 10 mg by mouth at bedtime.  6    Multiple Vitamin (MULTIVITAMIN WITH MINERALS) TABS Take 1 tablet by mouth daily.      Omega-3 Fatty Acids (FISH OIL) 500 MG CAPS Take 500 mg by mouth daily.      [name] 10 MG tablet TAKE 1 TABLET BY MOUTH IN THE MORNING, 1 IN THE AFTERNOON AND 2 AT BEDTIME (Patient taking differently: Take 10-20 mg by mouth in the morning, at noon, and at bedtime. 10 mg in the morning, 10 mg  mid-afternoon ( 330p) 20 mg at bedtime) 124 tablet 5    PHENObarbital (LUMINAL) 65 MG/ML injection Withdraw 53m ('65mg'$ ) Phenobarbital and 470mNormal Saline into 62m57myringe. [place] IV push for 3-5 minutes as needed for recurrent seizures (Patient not taking: Reported on 11/04/2022) 5 mL 5    potassium  chloride SA (KLOR-CON M20) 20 MEQ tablet TAKE 1 TABLET BY MOUTH EVERY DAY 31 tablet 5    promethazine (PHENERGAN) 25 MG suppository Place 25 mg rectally every 6 (six) hours as needed for nausea or vomiting.  (Patient not taking: Reported on 11/04/2022)      rosuvastatin (CRESTOR) 10 MG tablet Take 10 mg by mouth at bedtime.  5    Topiramate ER (TROKENDI XR) 200 MG CP24 Take 1 capsule by mouth at bedtime. 90 capsule 3    TROKENDI XR 50 MG CP24 Take 1 capsule at nighttime 31 capsule 5    VIMPAT 100 MG TABS TAKE 1 TABLET IN THE MORNING AND TAKE 2 TABLETS AT NIGHT (Patient taking differently: Take 100-200 mg by mouth in the morning and at bedtime. 100 mg in the morning & 200 mg at bedtime) 270 tablet 1      Vitals   Vitals:   11/19/22 1855 11/19/22 2119 11/19/22 2257 11/19/22 2326  BP: 110/74 96/68  108/72  Pulse: 94 81  81  Resp: '18 20  20  '$ Temp:   98 F (36.7 C) 97.8 F (36.6 C)  TempSrc:    Oral  SpO2: 98% 98%  98%  Weight:      Height:         Body mass index is 21.97 kg/m.  Physical Exam   General: Laying comfortably in bed; in no acute distress.  HENT: Normal oropharynx and mucosa. Normal external appearance of ears and nose.  Neck: Supple, no pain or tenderness  CV: No JVD. No peripheral edema.  Pulmonary: Symmetric Chest rise. Normal respiratory effort.  Abdomen: Soft to touch, non-tender.  Ext: No cyanosis, edema, or deformity  Skin: No rash. Normal palpation of skin.   Musculoskeletal: Normal digits and nails by inspection. No clubbing.   Neurologic Examination  Mental status/Cognition: Alert, oriented to self, place, poor attention.  Speech/language: dysarthric speech, non fluent and mom helps translate, comprehension intact to simple commands, object naming intact. Cranial nerves:   CN II Pupils equal and reactive to light, no VF deficits    CN Perez,IV,VI EOM intact, no gaze preference or deviation, no nystagmus    CN V normal sensation in V1, V2, and V3 segments  bilaterally    CN VII no asymmetry, no nasolabial fold flattening    CN VIII normal hearing to speech    CN IX & X normal palatal elevation, no uvular deviation    CN XI 5/5 head turn and 5/5 shoulder shrug bilaterally    CN XII midline tongue protrusion    Motor:  Muscle bulk: poor, tone normal Moves all extremities antigavity and on command with atleast 4+/5 in all extremities.  Sensation:  Light touch Intact throughout   Pin prick    Temperature    Vibration   Proprioception    Coordination/Complex Motor:  - Finger to Nose intact BL - Gait: deferred for patient safety given multiple seizures.  Labs   CBC:  Recent Labs  Lab 11/19/22 1726  WBC 4.4  NEUTROABS 2.0  HGB 13.8  HCT 40.4  MCV 84.7  PLT 2580000000 Basic Metabolic Panel:  Lab Results  Component Value Date   NA 136 11/19/2022   K 3.3 (L) 11/19/2022   CO2 20 (L) 11/19/2022   GLUCOSE 94 11/19/2022   BUN 11 11/19/2022   CREATININE 0.94 11/19/2022   CALCIUM 8.7 (L) 11/19/2022   GFRNONAA >60 11/19/2022   GFRAA >60 12/06/2019   Lipid Panel: No results found for: "LDLCALC" HgbA1c: No results found for: "HGBA1C" Urine Drug Screen:     Component Value Date/Time   LABOPIA NONE DETECTED 12/06/2019 1145   COCAINSCRNUR NONE DETECTED 12/06/2019 1145   LABBENZ POSITIVE (A) 12/06/2019 1145   AMPHETMU NONE DETECTED 12/06/2019 1145   THCU NONE DETECTED 12/06/2019 1145   LABBARB NONE DETECTED 12/06/2019 1145    Alcohol Level No results found for: "ETH"  CT Head without contrast(Personally reviewed): CTH was negative for a large hypodensity concerning for a large territory infarct or hyperdensity concerning for an ICH  cEEG:  pending  Impression   Quintez Sobieck Strandberg Perez is a 37 y.o. 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 Mom reports taking dayquil/nyqiul about twice a day in the last 3 days and reports intolerance to antihistamines and seizure in the past with antihistamines.  Given reported over 12+ absence seizures in the last few hours and 2 GTC every day for the last 3 days, would recommend observation for seizure clustering. Will add parempanel to current regimen and put him up on LTM EEG given absence seizures can be subtle and hard to identify clinically.  Recommendations  - cEEG - Continue home Keppra, Vimpat, trokendi, Tegretol and [name] - Perampanel '8mg'$  PO once, followed by '2mg'$  QHS. - Ativan 1-2 mg PRN for seizure lasting more than 3 mins. - Seizure precautions with seizure pads. - we will continue to follow along. - no antihistamines. ______________________________________________________________________   Thank you for the opportunity to take part in the care of this patient. If you have any further questions, please contact the neurology consultation attending.  Signed,  [place] Pager Number [phone] _ _ _   _ __   _ __ _ _  __ __   _ __   __ _

## 2022-11-19 NOTE — Assessment & Plan Note (Signed)
Stable. 

## 2022-11-19 NOTE — ED Provider Triage Note (Signed)
Emergency Medicine Provider Triage Evaluation Note  [name] , a 37 y.o. male  was evaluated in triage.  Pt complains of concerns for seizures that have been going for the past 3 days.  Notes that the been compliant with the medicines.  Per patient caregiver who is his mother notes that patient's had partial seizures had a seizure today at 3 PM where he hit his head on the sink..  Review of Systems  Positive:  Negative:   Physical Exam  BP (!) 96/53 (BP Location: Left Arm)   Pulse 88   Temp 98.7 F (37.1 C) (Oral)   Resp 16   Ht '5\' 3"'$  (1.6 m)   Wt 56.2 kg   SpO2 100%   BMI 21.97 kg/m  Gen:   Awake, no distress   Resp:  Normal effort  MSK:   Moves extremities without difficulty  Other:  Abrasion noted to left forehead  Medical Decision Making  Medically screening exam initiated at 5:35 PM.  Appropriate orders placed.  Byrd Buss Westhoff Perez was informed that the remainder of the evaluation will be completed by another provider, this initial triage assessment does not replace that evaluation, and the importance of remaining in the ED until their evaluation is complete.  5:42 PM - Discussed with RN that patient is in need of a room immediately. RN aware and working on room placement.     Juliann Olesky A, PA-C 11/19/22 1742

## 2022-11-19 NOTE — ED Provider Notes (Signed)
[facility] Provider Note   CSN: [id number] Arrival date & time: 11/19/22  1726     History  Chief Complaint  Patient presents with   Seizures    Fortino Rogalski Fellman [name] is a 37 y.o. male.  The history is provided by a parent, the patient and medical records. No language interpreter was used.  Seizures    37 year old male significant history of seizure disorder, status post [name] placement, intellectual disability, Brown syndrome, history of DVT, mitral valve prolapse, hypothyroidism, OSA brought here accompanied by mom who is his legal guardian for evaluation of seizures.  Per mom, patient has had multiple seizures for the past 3 days.  3 days ago he had 2 episodes of witnessed seizures for which mom access his port cath and gave him phenobarbital and Ativan.  He had several more episodes the next day, for which mom did the similar treatment including phenobarbital and Ativan through his port.  Today while at church, patient fell and struck his head followed by 12-15 episodes of seizures prompting this ER visit.  Mom reports patient has been compliant with his medication, no recent sickness, no new medication changes, nothing else that may have contribute to his recurrent seizure activities.  Unsure if he has any recent head injury aside from what happened today.  Mom did not give him any phenobarbital or Ativan today.  Home Medications Prior to Admission medications   Medication Sig Start Date End Date Taking? Authorizing Provider  acetaminophen (TYLENOL) 500 MG tablet Take 1,000 mg by mouth every 6 (six) hours as needed for headache. Patient not taking: Reported on 11/04/2022    [provider]  albuterol (ACCUNEB) 1.25 MG/3ML nebulizer solution Take 1 ampule by nebulization every 4 (four) hours as needed for wheezing.    [provider]  Calcium-Vitamin D-Vitamin K (VIACTIV CALCIUM PLUS D) 650-12.5-40 MG-MCG-MCG  CHEW Chew 1 each by mouth daily.    [provider]  carbamazepine (TEGRETOL) 100 MG chewable tablet CHEW 2 TABLETS IN THE MORNING, 2 TABLETS AT MIDDAY AND 1 & 1/2 TABLETS IN THE EVENING Patient taking differently: Chew 150 mg by mouth 3 (three) times daily. CHEW 1 1/2 TABLETS IN THE MORNING, 1 1/2 TABLETS AT MIDDAY AND 1 & 1/2 TABLETS IN THE EVENING 03/06/21   Jodi Geralds, MD  cetirizine (ZYRTEC) 10 MG tablet Take 10 mg by mouth daily.    [provider]  clotrimazole (LOTRIMIN) 1 % cream Apply 1 Application topically 2 (two) times daily as needed (irritation). Patient not taking: Reported on 11/04/2022    [provider]  Flaxseed, Linseed, (FLAXSEED OIL) 1000 MG CAPS Take 1,000 mg by mouth daily.    [provider]  hydrocortisone (ANUSOL-HC) 25 MG suppository Place 1 suppository (25 mg total) rectally every 12 (twelve) hours. Patient not taking: Reported on 11/04/2022 07/21/22   Zehr, Janett Billow D, PA-C  K-PHOS 500 MG tablet Take 1 tablet (500 mg total) by mouth 4 (four) times daily. 06/25/22   Rockwell Germany, NP  levETIRAcetam (KEPPRA) 500 MG tablet Take 1,000-1,250 mg by mouth in the morning and at bedtime. 1,000 mg (2 tabs) in the morning & 1,250 mg (2.5 tabs) at night    [provider]  levothyroxine (SYNTHROID) 50 MCG tablet Take 50 mcg by mouth daily before breakfast.    [provider]  LORazepam (ATIVAN) 2 MG/ML injection Inject 1 mL (2 mg total) into the vein See admin instructions.  Dilute '2mg'$  of lorazepam with 27m of saline.  Administer IV push over 3-5 minutes as needed for seizures. Patient not taking: Reported on 11/04/2022 10/01/22   GRockwell Germany NP  mesalamine (CANASA) 1000 MG suppository Place 1 suppository (1,000 mg total) rectally at bedtime for 14 days. 10/02/22 10/16/22  NMauri Pole MD  montelukast (SINGULAIR) 10 MG tablet Take 10 mg by mouth at bedtime. 02/05/18   [provider]  Multiple Vitamin  (MULTIVITAMIN WITH MINERALS) TABS Take 1 tablet by mouth daily.    [provider]  Omega-3 Fatty Acids (FISH OIL) 500 MG CAPS Take 500 mg by mouth daily.    [provider]  ondansetron (ZOFRAN-ODT) 8 MG disintegrating tablet Take 8 mg by mouth every 8 (eight) hours. Patient not taking: Reported on 11/04/2022 09/04/22   [provider]  [name] 10 MG tablet TAKE 1 TABLET BY MOUTH IN THE MORNING, 1 IN THE AFTERNOON AND 2 AT BEDTIME Patient taking differently: Take 10-20 mg by mouth in the morning, at noon, and at bedtime. 10 mg in the morning, 10 mg mid-afternoon ( 330p) 20 mg at bedtime 06/04/21   GRockwell Germany NP  PHENObarbital (LUMINAL) 65 MG/ML injection Withdraw 1340m('65mg'$ ) Phenobarbital and 40m91mormal Saline into 5ml75mringe. [place] IV push for 3-5 minutes as needed for recurrent seizures Patient not taking: Reported on 11/04/2022 10/01/22   GoodRockwell Germany  potassium chloride SA (KLOR-CON M20) 20 MEQ tablet TAKE 1 TABLET BY MOUTH EVERY DAY 11/05/22   GoodRockwell Germany  promethazine (PHENERGAN) 25 MG suppository Place 25 mg rectally every 6 (six) hours as needed for nausea or vomiting.  Patient not taking: Reported on 11/04/2022 07/04/19   [provider]  rosuvastatin (CRESTOR) 10 MG tablet Take 10 mg by mouth at bedtime. 05/26/16   [provider]  Topiramate ER (TROKENDI XR) 200 MG CP24 Take 1 capsule by mouth at bedtime. 05/29/21   HickJodi Geralds  TROKENDI XR 50 MG CP24 Take 1 capsule at nighttime 03/06/21   Hickling, WillPrincess Bruins  VIMPAT 100 MG TABS TAKE 1 TABLET IN THE MORNING AND TAKE 2 TABLETS AT NIGHT Patient taking differently: Take 100-200 mg by mouth in the morning and at bedtime. 100 mg in the morning & 200 mg at bedtime 10/21/21   GoodRockwell Germany      Allergies    Antihistamines, chlorpheniramine-type; Divalproex sodium; Phenytoin; Valproic acid and related; and Vancomycin    Review of Systems   Review of Systems   Neurological:  Positive for seizures.  All other systems reviewed and are negative.   Physical Exam Updated Vital Signs BP 110/74   Pulse 94   Temp 98.7 F (37.1 C) (Oral)   Resp 18   Ht '5\' 3"'$  (1.6 m)   Wt 56.2 kg   SpO2 98%   BMI 21.97 kg/m  Physical Exam Vitals and nursing note reviewed.  Constitutional:      General: He is not in acute distress.    Appearance: He is well-developed.     Comments: Patient of small stature resting comfortably in no acute distress.  HENT:     Head: Normocephalic and atraumatic.     Comments: Mild tenderness to left parietal region no crepitus no bruising noted.    Mouth/Throat:     Comments: No tongue injury. Eyes:     Conjunctiva/sclera: Conjunctivae normal.  Cardiovascular:     Rate and Rhythm: Normal rate and regular rhythm.  Pulses: Normal pulses.     Heart sounds: Normal heart sounds.  Pulmonary:     Effort: Pulmonary effort is normal.     Breath sounds: Normal breath sounds. No wheezing, rhonchi or rales.  Abdominal:     Palpations: Abdomen is soft.     Tenderness: There is no abdominal tenderness.  Musculoskeletal:     Cervical back: Normal range of motion and neck supple.     Comments: Moving all 4 extremities with equal strength  Skin:    Findings: No rash.  Neurological:     Mental Status: He is alert. Mental status is at baseline.  Psychiatric:        Mood and Affect: Mood normal.     ED Results / Procedures / Treatments   Labs (all labs ordered are listed, but only abnormal results are displayed) Labs Reviewed  COMPREHENSIVE METABOLIC PANEL - Abnormal; Notable for the following components:      Result Value   Potassium 3.3 (*)    CO2 20 (*)    Calcium 8.7 (*)    All other components within normal limits  URINALYSIS, ROUTINE W REFLEX MICROSCOPIC - Abnormal; Notable for the following components:   Color, Urine STRAW (*)    All other components within normal limits  PHENOBARBITAL LEVEL - Abnormal; Notable  for the following components:   Phenobarbital <5.0 (*)    All other components within normal limits  CBC WITH DIFFERENTIAL/PLATELET  LEVETIRACETAM LEVEL  CBG MONITORING, ED    EKG None ED ECG REPORT   Date: 11/19/2022  Rate: 88  Rhythm: normal sinus rhythm  QRS Axis: normal  Intervals: normal  ST/T Wave abnormalities: nonspecific T wave changes  Conduction Disutrbances:none  Narrative Interpretation:   Old EKG Reviewed: unchanged  I have personally reviewed the EKG tracing and agree with the computerized printout as noted.   Radiology CT Head Wo Contrast  Result Date: 11/19/2022 CLINICAL DATA:  Seizures. EXAM: CT HEAD WITHOUT CONTRAST TECHNIQUE: Contiguous axial images were obtained from the base of the skull through the vertex without intravenous contrast. RADIATION DOSE REDUCTION: This exam was performed according to the departmental dose-optimization program which includes automated exposure control, adjustment of the mA and/or kV according to patient size and/or use of iterative reconstruction technique. 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 There is no mass effect on the underlying brain parenchyma or midline shift. There is no other evidence of acute intracranial hemorrhage or extra-axial fluid collection Parenchymal volume is normal. The ventricles are normal in size. Gray-white differentiation is preserved. 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 The sella appears small, unchanged. There is no evidence of suprasellar mass lesion. There is no mass effect or midline shift. Vascular: No hyperdense vessel or unexpected calcification. Skull: There is no calvarial fracture or suspicious osseous lesion. There are remote bilateral nasal bone  fractures. Sinuses/Orbits: The paranasal sinuses are clear. Bilateral lens implants are in place. The globes and orbits are otherwise unremarkable. Other: None. IMPRESSION: 1. 4 mm thick hypodense subdural collection over the left cerebral convexity without mass effect or midline shift is likely subacute to chronic. No evidence of acute blood products. 2. Otherwise, no evidence of acute intracranial pathology. 3. Unchanged dense basal ganglia calcifications. Electronically Signed   By: Court [name].D.  On: 11/19/2022 18:46    Procedures Procedures    Medications Ordered in ED Medications  perampanel (FYCOMPA) tablet 8 mg (has no administration in time range)    ED Course/ Medical Decision Making/ A&P                             Medical Decision Making Amount and/or Complexity of Data Reviewed Labs: ordered.  Risk Decision regarding hospitalization.   BP 110/74   Pulse 94   Temp 98.7 F (37.1 C) (Oral)   Resp 18   Ht '5\' 3"'$  (1.6 m)   Wt 56.2 kg   SpO2 98%   BMI 21.97 kg/m   35:22 PM 37 year old male significant history of seizure disorder, status post [name] placement, intellectual disability, Brown syndrome, history of DVT, mitral valve prolapse, hypothyroidism, OSA brought here accompanied by mom who is his legal guardian for evaluation of seizures.  Per mom, patient has had multiple seizures for the past 3 days.  3 days ago he had 2 episodes of witnessed seizures for which mom access his port cath and gave him phenobarbital and Ativan.  He had several more episodes the next day, for which mom did the similar treatment including phenobarbital and Ativan through his port.  Today while at church, patient fell and struck his head followed by 12-15 episodes of seizures prompting this ER visit.  Mom reports patient has been compliant with his medication, no recent sickness, no new medication changes, nothing else that may have contribute to his recurrent seizure activities.   Unsure if he has any recent head injury aside from what happened today.  Mom did not give him any phenobarbital or Ativan today.  On exam this is a pleasant male appears smaller than his stated age.  He is in no acute discomfort.  He does have some mild tenderness noted to his left parietal scalp without any crepitus or bruising noted.  No midface tenderness.  No midline spine tenderness.  No tenderness to chest abdomen or hip.  He is able to move all 4 extremities with equal strength.  Vitals are reviewed and overall reassuring.  -Labs ordered, independently viewed and interpreted by me.  Labs remarkable for K+ 3.3. -The patient was maintained on a cardiac monitor.  I personally viewed and interpreted the cardiac monitored which showed an underlying rhythm of: NSR -Imaging independently viewed and interpreted by me and I agree with radiologist's interpretation.  Result remarkable for head CT scan showing 25m thich hypodense subdural collection over the left cerebral convexity likely subacute to chronic without any acute blood product. -This patient presents to the ED for concern of seizures, this involves an extensive number of treatment options, and is a complaint that carries with it a high risk of complications and morbidity.  The differential diagnosis includes seizures, PNES, near syncope, syncope, cardiac arrhythmia, dehydration -Co morbidities that complicate the patient evaluation includes Hx of seizure -Treatment includes perampanel -Reevaluation of the patient after these medicines showed that the patient improved -PCP office notes or outside notes reviewed -Discussion with specialist Dr. SBenjamine Spragueand hospitalist Dr. CBridgett Larsson-Escalation to admission/observation considered: patient and parent I agreeable with admission.  Per mom, at baseline, patient has approximately 2 episodes of seizures per week sometimes only 2/month.  The increase in seizure activities within the past 3 days is out of  the norm for him.  I discussed this with on-call neurologist Dr. KCarolin Guernseywho evaluated patient  and will offer recommendation. Keppra '500mg'$ , Vimpat '100mg'$ , topiramate '200mg'$ , phenobarbital '65mg'$ , tegretol '100mg'$ ,  [name] '10mg'$ .    8:29 PM  Neurology has seen and evaluated patient and recommend to resume patient's seizure medication here and patient will need to be admitted.  Patient will be started on perampanel 8 mg once and 2 mg nightly tomorrow.  Patient will need to be placed on long-term monitor for EEG.  9:12 PM Appreciate consultation from [facility] hospitalist Dr. Bridgett Larsson who agrees to see and will admit patient for further management of his condition.        Final Clinical Impression(s) / ED Diagnoses Final diagnoses:  Seizure [facility])    Rx / DC Orders ED Discharge Orders     None         Domenic Moras, PA-C 11/19/22 2112    Lorelle Gibbs, DO 11/20/22 0030

## 2022-11-19 NOTE — Subjective & Objective (Signed)
CC: seizures HPI: 37 year old male with a history of epilepsy status post vagal nerve stimulator, chronic migraines, hypothyroidism, OSA on CPAP, prior history of DVT off anticoagulation, presents to the ER today with recurrent breakthrough seizures.  Patient has a long history of seizures.  He is followed extensively at [facility] by neurology and also local neurologist.  Mother states the patient had a upper respiratory illness last week.  She has been giving him NyQuil which contains acetaminophen dextromethorphan and doxylamine.  She did not know that doxylamine was an antihistamine.  She states that the patient has intolerances to most antihistamines including Benadryl which causes him to have a lower seizure threshold.  He can take Zyrtec without difficulty.  Patient was taking NyQuil on Wednesday Thursday and Friday of last week.  He started having seizures on Monday and Tuesday and today.  He had 2 seizures on Monday, 2 seizures on Tuesday, 2 seizures on Wednesday.  He fell and hit his face today.  She has been giving him IV Ativan and IV phenobarbital via his [name] which she accessed at home.  Workup here in the ER only showed mild hypokalemia with a potassium 3.3.  Patient had a 4 mm thick hypodense subdural collection of the left cerebral complex without mass effect or midline shift likely subacute to chronic.  This was compared with CT from January 2017.   Neurology has been consulted.  [facility] hospitalist contacted for admission.

## 2022-11-19 NOTE — ED Notes (Signed)
Shift report received, assumed care of patient at this time.

## 2022-11-20 ENCOUNTER — Observation Stay (HOSPITAL_COMMUNITY): Payer: Medicaid Other

## 2022-11-20 DIAGNOSIS — Z8349 Family history of other endocrine, nutritional and metabolic diseases: Secondary | ICD-10-CM | POA: Diagnosis not present

## 2022-11-20 DIAGNOSIS — Z833 Family history of diabetes mellitus: Secondary | ICD-10-CM | POA: Diagnosis not present

## 2022-11-20 DIAGNOSIS — E876 Hypokalemia: Secondary | ICD-10-CM | POA: Diagnosis present

## 2022-11-20 DIAGNOSIS — Z832 Family history of diseases of the blood and blood-forming organs and certain disorders involving the immune mechanism: Secondary | ICD-10-CM | POA: Diagnosis not present

## 2022-11-20 DIAGNOSIS — Z888 Allergy status to other drugs, medicaments and biological substances status: Secondary | ICD-10-CM | POA: Diagnosis not present

## 2022-11-20 DIAGNOSIS — Z79890 Hormone replacement therapy (postmenopausal): Secondary | ICD-10-CM | POA: Diagnosis not present

## 2022-11-20 DIAGNOSIS — Z803 Family history of malignant neoplasm of breast: Secondary | ICD-10-CM | POA: Diagnosis not present

## 2022-11-20 DIAGNOSIS — Z9682 Presence of neurostimulator: Secondary | ICD-10-CM | POA: Diagnosis not present

## 2022-11-20 DIAGNOSIS — G43701 Chronic migraine without aura, not intractable, with status migrainosus: Secondary | ICD-10-CM | POA: Diagnosis present

## 2022-11-20 DIAGNOSIS — G40419 Other generalized epilepsy and epileptic syndromes, intractable, without status epilepticus: Secondary | ICD-10-CM | POA: Diagnosis present

## 2022-11-20 DIAGNOSIS — Z86718 Personal history of other venous thrombosis and embolism: Secondary | ICD-10-CM | POA: Diagnosis not present

## 2022-11-20 DIAGNOSIS — G40319 Generalized idiopathic epilepsy and epileptic syndromes, intractable, without status epilepticus: Secondary | ICD-10-CM | POA: Diagnosis present

## 2022-11-20 DIAGNOSIS — F70 Mild intellectual disabilities: Secondary | ICD-10-CM | POA: Diagnosis present

## 2022-11-20 DIAGNOSIS — Z881 Allergy status to other antibiotic agents status: Secondary | ICD-10-CM | POA: Diagnosis not present

## 2022-11-20 DIAGNOSIS — Z801 Family history of malignant neoplasm of trachea, bronchus and lung: Secondary | ICD-10-CM | POA: Diagnosis not present

## 2022-11-20 DIAGNOSIS — R6250 Unspecified lack of expected normal physiological development in childhood: Secondary | ICD-10-CM | POA: Diagnosis present

## 2022-11-20 DIAGNOSIS — Z79899 Other long term (current) drug therapy: Secondary | ICD-10-CM | POA: Diagnosis not present

## 2022-11-20 DIAGNOSIS — W19XXXA Unspecified fall, initial encounter: Secondary | ICD-10-CM | POA: Diagnosis present

## 2022-11-20 DIAGNOSIS — E785 Hyperlipidemia, unspecified: Secondary | ICD-10-CM | POA: Diagnosis present

## 2022-11-20 DIAGNOSIS — G4733 Obstructive sleep apnea (adult) (pediatric): Secondary | ICD-10-CM | POA: Diagnosis present

## 2022-11-20 DIAGNOSIS — E039 Hypothyroidism, unspecified: Secondary | ICD-10-CM | POA: Diagnosis present

## 2022-11-20 DIAGNOSIS — I341 Nonrheumatic mitral (valve) prolapse: Secondary | ICD-10-CM | POA: Diagnosis present

## 2022-11-20 DIAGNOSIS — Z8249 Family history of ischemic heart disease and other diseases of the circulatory system: Secondary | ICD-10-CM | POA: Diagnosis not present

## 2022-11-20 DIAGNOSIS — Z823 Family history of stroke: Secondary | ICD-10-CM | POA: Diagnosis not present

## 2022-11-20 LAB — CBC WITH DIFFERENTIAL/PLATELET
Abs Immature Granulocytes: 0.01 10*3/uL (ref 0.00–0.07)
Basophils Absolute: 0 10*3/uL (ref 0.0–0.1)
Basophils Relative: 0 %
Eosinophils Absolute: 0 10*3/uL (ref 0.0–0.5)
Eosinophils Relative: 1 %
HCT: 37.8 % — ABNORMAL LOW (ref 39.0–52.0)
Hemoglobin: 12.8 g/dL — ABNORMAL LOW (ref 13.0–17.0)
Immature Granulocytes: 0 %
Lymphocytes Relative: 52 %
Lymphs Abs: 1.8 10*3/uL (ref 0.7–4.0)
MCH: 28.7 pg (ref 26.0–34.0)
MCHC: 33.9 g/dL (ref 30.0–36.0)
MCV: 84.8 fL (ref 80.0–100.0)
Monocytes Absolute: 0.3 10*3/uL (ref 0.1–1.0)
Monocytes Relative: 8 %
Neutro Abs: 1.3 10*3/uL — ABNORMAL LOW (ref 1.7–7.7)
Neutrophils Relative %: 39 %
Platelets: 233 10*3/uL (ref 150–400)
RBC: 4.46 MIL/uL (ref 4.22–5.81)
RDW: 12.9 % (ref 11.5–15.5)
WBC: 3.4 10*3/uL — ABNORMAL LOW (ref 4.0–10.5)
nRBC: 0 % (ref 0.0–0.2)

## 2022-11-20 LAB — COMPREHENSIVE METABOLIC PANEL
ALT: 24 U/L (ref 0–44)
AST: 20 U/L (ref 15–41)
Albumin: 3.3 g/dL — ABNORMAL LOW (ref 3.5–5.0)
Alkaline Phosphatase: 69 U/L (ref 38–126)
Anion gap: 6 (ref 5–15)
BUN: 11 mg/dL (ref 6–20)
CO2: 22 mmol/L (ref 22–32)
Calcium: 8.4 mg/dL — ABNORMAL LOW (ref 8.9–10.3)
Chloride: 109 mmol/L (ref 98–111)
Creatinine, Ser: 0.97 mg/dL (ref 0.61–1.24)
GFR, Estimated: >60 mL/min (ref >60)
Glucose, Bld: 96 mg/dL (ref 70–99)
Potassium: 3.3 mmol/L — ABNORMAL LOW (ref 3.5–5.1)
Sodium: 137 mmol/L (ref 135–145)
Total Bilirubin: 0.5 mg/dL (ref 0.3–1.2)
Total Protein: 6.5 g/dL (ref 6.5–8.1)

## 2022-11-20 LAB — MAGNESIUM: Magnesium: 2 mg/dL (ref 1.7–2.4)

## 2022-11-20 MED ORDER — MIDAZOLAM HCL 2 MG/2ML IJ SOLN: 2.0000 mg | INTRAMUSCULAR | Status: DC | PRN

## 2022-11-20 MED ORDER — CARBAMAZEPINE 100 MG PO CHEW
50.0000 mg | CHEWABLE_TABLET | Freq: Three times a day (TID) | ORAL | Status: DC
  Administered 2022-11-20 – 2022-11-21 (×3): 50 mg via ORAL
  Filled 2022-11-20 (×5): qty 0.5

## 2022-11-20 MED ORDER — PERAMPANEL 2 MG PO TABS
2.0000 mg | ORAL_TABLET | Freq: Every day | ORAL | Status: DC
  Administered 2022-11-20: 2 mg via ORAL
  Filled 2022-11-20: qty 1

## 2022-11-20 MED ORDER — CARBAMAZEPINE 100 MG PO CHEW
100.0000 mg | CHEWABLE_TABLET | Freq: Three times a day (TID) | ORAL | Status: DC
  Administered 2022-11-20 – 2022-11-21 (×3): 100 mg via ORAL
  Filled 2022-11-20 (×5): qty 1

## 2022-11-20 MED ORDER — PROCHLORPERAZINE EDISYLATE 10 MG/2ML IJ SOLN
10.0000 mg | Freq: Four times a day (QID) | INTRAMUSCULAR | Status: DC | PRN
  Administered 2022-11-20 (×2): 10 mg via INTRAVENOUS
  Filled 2022-11-20 (×3): qty 2

## 2022-11-20 MED ORDER — POTASSIUM CHLORIDE CRYS ER 20 MEQ PO TBCR
30.0000 meq | EXTENDED_RELEASE_TABLET | ORAL | Status: AC
  Administered 2022-11-20 (×2): 30 meq via ORAL
  Filled 2022-11-20 (×2): qty 1

## 2022-11-20 NOTE — Progress Notes (Signed)
PROGRESS NOTE    [name]  [id number] DOB: 1986/05/26 DOA: 11/19/2022 PCP: Chesley Noon, MD    Brief Narrative:   [name] is a 37 y.o. male with past medical history significant for epilepsy s/p vagal nerve stimulator, chronic migraines, hypothyroidism, OSA on CPAP, history of DVT off of anticoagulation who presented to [facility] ED on 2/28 with recurrent breakthrough seizures.  Patient is followed extensively at [facility] by neurology as well as a local neurologist.  Longstanding history of seizure disorder/intractable epilepsy.  Mother reports the patient had an upper respiratory illness last week and has been giving him NyQuil which contains acetaminophen/dextromethorphan and doxylamine which is an antihistamine.  Patient was reported taking NyQuil on Wednesday, Thursday and Friday of last week and started having seizures on Monday and Tuesday as well as day of admission.  Seizures he fell and hit his face.  She has been giving him IV Ativan and IV phenobarbital via his [name] which she accessed at home.  In the ED, temperature 98.7 F, HR 88, RR 16, BP 96/53, SpO2 100% on room air.  WBC 4.4, hemoglobin 13.8, platelets 258.  Sodium 136, potassium 3.3, chloride 105, CO2 20, glucose 94, BUN 11, creatinine 0.94.  AST 26, ALT 25, total bili 0.7.  Urinalysis unrevealing.  CT head without contrast with 4 mm thick hyperdense subdural collection over the left cerebral convexity without mass effect or midline shift likely subacute to chronic, no evidence of acute blood products, otherwise no acute intracranial abnormality.  Neurology was consulted.  TRH consulted for further evaluation and management of recurrent/breakthrough seizures likely related to antihistamine use  Assessment & Plan:   Generalized convulsive epilepsy with intractable epilepsy Patient presenting to ED with recurrent seizures at home likely related to recent use of  antihistamines which lowers seizure threshold.  Patient has been given phenobarbital and Ativan at home via his mother but continued with seizure activity. -- Neurology following, appreciate assistance -- On continuous EEG -- started on perampanel 2 mg p.o. nightly per neurology -- [name] '10mg'$  PO BID and 20 mg PO qHS -- Vimpat 100 mg PO qAM and '200mg'$  PO qHS -- Keppra '1500mg'$  PO BID -- Trokendi XR '250mg'$  PO qHS  Hypokalemia Potassium 3.3 this morning, magnesium 2.0. --Continue to replete potassium today  S/P placement of VNS (vagus nerve stimulation) device Stable.   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   Migraine without aura and without status migrainosus, not intractable Stable.   Hypothyroidism Stable. Continue 50 mcg synthroid.   OSA on CPAP Continue home CPAP.   DVT prophylaxis: SCDs Start: 11/19/22 2327    Code Status: Full Code Family Communication: Updated mother present at bedside this morning  Disposition Plan:  Level of care: Telemetry Medical Status is: Observation The patient remains OBS appropriate and will d/c before 2 midnights.    Consultants:  Neurology  Procedures:  Continuous EEG  Antimicrobials:  None   Subjective: Patient seen examined at bedside, resting calmly.  Remains on continuous EEG and on CPAP.  Mother present at bedside.  No questions or concerns at this time.  Awaiting further neurology recommendations and read from EEG.  Denies headache, no shortness of breath, no chest pain, no abdominal pain.  No acute events overnight per nursing staff.  Objective: Vitals:   11/20/22 0305 11/20/22 0500 11/20/22 0702 11/20/22 1102  BP: (!) 88/65  (!) 82/60 95/66  Pulse: 73  63  77  Resp: '19  18 19  '$ Temp: 98 F (36.7 C)  (!) 96.6 F (35.9 C) (!) 97.4 F (36.3 C)  TempSrc: Oral  Axillary Oral  SpO2: 96%  98% 97%  Weight:  57.4 kg    Height:       No intake or output data in the 24 hours ending  11/20/22 1125 Filed Weights   11/19/22 1734 11/20/22 0500  Weight: 56.2 kg 57.4 kg    Examination:  Physical Exam: GEN: NAD, alert  HEENT: NCAT, PERRL, EOMI, sclera clear, MMM PULM: CTAB w/o wheezes/crackles, normal respiratory effort, on CPAP this morning CV: RRR w/o M/G/R GI: abd soft, NTND, NABS, no R/G/M MSK: no peripheral edema, moves all EXTR independently   Data Reviewed: I have personally reviewed following labs and imaging studies  CBC: Recent Labs  Lab 11/19/22 1726 11/20/22 0642  WBC 4.4 3.4*  NEUTROABS 2.0 1.3*  HGB 13.8 12.8*  HCT 40.4 37.8*  MCV 84.7 84.8  PLT 258 0000000   Basic Metabolic Panel: Recent Labs  Lab 11/19/22 1726 11/20/22 0642  NA 136 137  K 3.3* 3.3*  CL 105 109  CO2 20* 22  GLUCOSE 94 96  BUN 11 11  CREATININE 0.94 0.97  CALCIUM 8.7* 8.4*  MG  --  2.0   GFR: Estimated Creatinine Clearance: 85.5 mL/min (by C-G formula based on SCr of 0.97 mg/dL). Liver Function Tests: Recent Labs  Lab 11/19/22 1726 11/20/22 0642  AST 26 20  ALT 25 24  ALKPHOS 76 69  BILITOT 0.7 0.5  PROT 7.0 6.5  ALBUMIN 3.7 3.3*   No results for input(s): "LIPASE", "AMYLASE" in the last 168 hours. No results for input(s): "AMMONIA" in the last 168 hours. Coagulation Profile: No results for input(s): "INR", "PROTIME" in the last 168 hours. Cardiac Enzymes: No results for input(s): "CKTOTAL", "CKMB", "CKMBINDEX", "TROPONINI" in the last 168 hours. BNP (last 3 results) No results for input(s): "PROBNP" in the last 8760 hours. HbA1C: No results for input(s): "HGBA1C" in the last 72 hours. CBG: Recent Labs  Lab 11/19/22 1740  GLUCAP 89   Lipid Profile: No results for input(s): "CHOL", "HDL", "LDLCALC", "TRIG", "CHOLHDL", "LDLDIRECT" in the last 72 hours. Thyroid Function Tests: No results for input(s): "TSH", "T4TOTAL", "FREET4", "T3FREE", "THYROIDAB" in the last 72 hours. Anemia Panel: No results for input(s): "VITAMINB12", "FOLATE", "FERRITIN",  "TIBC", "IRON", "RETICCTPCT" in the last 72 hours. Sepsis Labs: No results for input(s): "PROCALCITON", "LATICACIDVEN" in the last 168 hours.  No results found for this or any previous visit (from the past 240 hour(s)).       Radiology Studies: CT Head Wo Contrast  Result Date: 11/19/2022 CLINICAL DATA:  Seizures. EXAM: CT HEAD WITHOUT CONTRAST TECHNIQUE: Contiguous axial images were obtained from the base of the skull through the vertex without intravenous contrast. RADIATION DOSE REDUCTION: This exam was performed according to the departmental dose-optimization program which includes automated exposure control, adjustment of the mA and/or kV according to patient size and/or use of iterative reconstruction technique. 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 There is no mass effect on the underlying brain parenchyma or midline shift. There is no other evidence of acute intracranial hemorrhage or extra-axial fluid collection Parenchymal volume is normal. The ventricles are normal in size. Gray-white differentiation is preserved. 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 The sella appears small, unchanged. There is no evidence of suprasellar mass lesion. There is no mass effect or midline shift. Vascular: No hyperdense vessel or unexpected calcification. Skull: There is no calvarial fracture or suspicious osseous lesion. There are remote bilateral nasal bone fractures. Sinuses/Orbits: The paranasal sinuses are clear. Bilateral lens implants are in place. The globes and orbits are otherwise unremarkable. Other: None. IMPRESSION: 1. 4 mm thick hypodense subdural collection over the left cerebral convexity without mass effect or midline shift is likely subacute to  chronic. No evidence of acute blood products. 2. Otherwise, no evidence of acute intracranial pathology. 3. Unchanged dense basal ganglia calcifications. Electronically Signed   By: Valetta Mole M.D.   On: 11/19/2022 18:46        Scheduled Meds:  carbamazepine  150 mg Oral TID   cloBAZam  10 mg Oral BID   And   cloBAZam  20 mg Oral QHS   lacosamide  100 mg Oral q AM   And   lacosamide  200 mg Oral QHS   levETIRAcetam  1,250 mg Oral BID   perampanel  2 mg Oral QHS   Topiramate ER  50 mg Oral QHS   And   Topiramate ER  200 mg Oral QHS   Continuous Infusions:   LOS: 0 days    Time spent: 52 minutes spent on chart review, discussion with nursing staff, consultants, updating family and interview/physical exam; more than 50% of that time was spent in counseling and/or coordination of care.    Shreshta Medley J [place] DO [facility] Available via Epic secure chat 7am-7pm After these hours, please refer to coverage provider listed on [email or web address] 11/20/2022, 11:25 AM

## 2022-11-20 NOTE — Procedures (Signed)
Patient Name: [name]  MRN: [id number]  Epilepsy Attending: Lora Havens  Referring Physician/Provider: Donnetta Simpers, MD  Duration: 11/20/2022 0319 to 11/21/2022 0319  Patient history: 37 y.o. male with PMH significant for intractable epilepsy on Keppra, Vimpat, Tegretol, [name], Trokendi, status post VNS placement, OSA on CPAP, history of developmental delay who presents with multiple breakthrough seizures despite compliance with seizure medication and no obvious signs of infection. EEG to evaluate for seizure  Level of alertness: Awake, asleep  AEDs during EEG study: Keppra, Vimpat, trokendi, Tegretol, [name], Perampanel  Technical aspects: This EEG study was done with scalp electrodes positioned according to the 10-20 International system of electrode placement. Electrical activity was reviewed with band pass filter of 1-'70Hz'$ , sensitivity of 7 uV/mm, display speed of 39m/sec with a '60Hz'$  notched filter applied as appropriate. EEG data were recorded continuously and digitally stored.  Video monitoring was available and reviewed as appropriate.  Description: The posterior dominant rhythm consists of 9 Hz activity of moderate voltage (25-35 uV) seen predominantly in posterior head regions, symmetric and reactive to eye opening and eye closing. Sleep was characterized by vertex waves, sleep spindles (12 to 14 Hz), maximal frontocentral region. EEG showed intermittent generalized and maximal left temporal 3 to 6 Hz theta-delta slowing. Hyperventilation and photic stimulation were not performed.     ABNORMALITY - Intermittent slow, generalized and maximal left temporal  IMPRESSION: This study is suggestive of non-specific cortical dysfunction arising from left temporal region. Additionally there is mild diffuse encephalopathy, nonspecific etiology. No seizures or epileptiform discharges were seen throughout the recording.  Please note lack of epileptiform activity during  interictal EEG does not exclude the diagnosis of epilepsy.  Algis Lehenbauer [name]

## 2022-11-20 NOTE — Progress Notes (Addendum)
LTM EEG hooked up and running - no initial skin breakdown - push button tested - [facility] monitoring.  

## 2022-11-20 NOTE — Progress Notes (Signed)
Subjective: No further seizures overnight. Per mother, had 2-4 seizures/months, max 2/week. Denies recent illness. Has chronic cough due to allergies   ROS: negative except above  Examination  Vital signs in last 24 hours: Temp:  [96.6 F (35.9 C)-98.7 F (37.1 C)] 97.4 F (36.3 C) (02/29 1102) Pulse Rate:  [63-94] 77 (02/29 1102) Resp:  [16-21] 19 (02/29 1102) BP: (82-110)/(53-74) 95/66 (02/29 1102) SpO2:  [96 %-100 %] 97 % (02/29 1102) Weight:  [56.2 kg-57.4 kg] 57.4 kg (02/29 0500)  General: lying in bed, NAD CVS: pulse-normal rate and rhythm RS: breathing comfortably Extremities: normal   Neuro: MS: Alert, oriented to self, follows commands CN: pupils equal and reactive,  EOMI, face symmetric, tongue midline, normal sensation over face, Motor: 4/5 strength in all 4 extremities with left upper extremity paresis and increased tone at baseline due to cerebral palsy  Basic Metabolic Panel: Recent Labs  Lab 11/19/22 1726 11/20/22 0642  NA 136 137  K 3.3* 3.3*  CL 105 109  CO2 20* 22  GLUCOSE 94 96  BUN 11 11  CREATININE 0.94 0.97  CALCIUM 8.7* 8.4*  MG  --  2.0    CBC: Recent Labs  Lab 11/19/22 1726 11/20/22 0642  WBC 4.4 3.4*  NEUTROABS 2.0 1.3*  HGB 13.8 12.8*  HCT 40.4 37.8*  MCV 84.7 84.8  PLT 258 233    Coagulation Studies: No results for input(s): "LABPROT", "INR" in the last 72 hours.  Imaging CTH wo contrast 2/28/024:  1. 4 mm thick hypodense subdural collection over the left cerebral convexity without mass effect or midline shift is likely subacute to chronic. No evidence of acute blood products. 2. Otherwise, no evidence of acute intracranial pathology. 3. Unchanged dense basal ganglia calcifications.   ASSESSMENT AND PLAN: 37yo M with medically refractory epilepsy s/p VNS who presented with increased frequency of seizures.  Epilepsy with breakthrough seizures - no further seizures overnight - Continue home Keppra, Vimpat, trokendi,  Tegretol and [name]  - Continue Perampanel '2mg'$  qhs, can eventually dc if no seizure recurrence - will leave on eeg today, likely dc tomorrow if no seizures - continue seizure precautions - prn IV ativan or versed for clinical seizures - Management of rest of comorbidity per primary team   I have spent a total of  36 minutes with the patient reviewing hospital notes,  test results, labs and examining the patient as well as establishing an assessment and plan that was discussed personally with the patient.  > 50% of time was spent in direct patient care.   Zeb Comfort Epilepsy [facility] For questions after 5pm please refer to AMION to reach the Neurologist on call

## 2022-11-21 ENCOUNTER — Other Ambulatory Visit (HOSPITAL_COMMUNITY): Payer: Self-pay

## 2022-11-21 DIAGNOSIS — G40319 Generalized idiopathic epilepsy and epileptic syndromes, intractable, without status epilepticus: Secondary | ICD-10-CM | POA: Diagnosis not present

## 2022-11-21 LAB — BASIC METABOLIC PANEL WITH GFR
Anion gap: 5 (ref 5–15)
BUN: 10 mg/dL (ref 6–20)
CO2: 22 mmol/L (ref 22–32)
Calcium: 8.6 mg/dL — ABNORMAL LOW (ref 8.9–10.3)
Chloride: 112 mmol/L — ABNORMAL HIGH (ref 98–111)
Creatinine, Ser: 0.93 mg/dL (ref 0.61–1.24)
GFR, Estimated: >60 mL/min
Glucose, Bld: 102 mg/dL — ABNORMAL HIGH (ref 70–99)
Potassium: 3.8 mmol/L (ref 3.5–5.1)
Sodium: 139 mmol/L (ref 135–145)

## 2022-11-21 LAB — LEVETIRACETAM LEVEL: Levetiracetam Lvl: 55.9 ug/mL — ABNORMAL HIGH (ref 10.0–40.0)

## 2022-11-21 LAB — MAGNESIUM: Magnesium: 2 mg/dL (ref 1.7–2.4)

## 2022-11-21 MED ORDER — PERAMPANEL 2 MG PO TABS: 2.0000 mg | ORAL_TABLET | Freq: Every day | ORAL | 0 refills | Status: DC

## 2022-11-21 NOTE — Discharge Summary (Signed)
Physician Discharge Summary  [name] [id number] DOB: Mar 10, 1986 DOA: 11/19/2022  PCP: Chesley Noon, MD  Admit date: 11/19/2022 Discharge date: 11/21/2022  Admitted From: Home Disposition: Home  Recommendations for Outpatient Follow-up:  Follow up with PCP in 1-2 weeks Follow-up with your neurologist outpatient Started on perampanel 2 mg p.o. nightly per neurology  Continue to encourage avoidance of antihistamines as this lowers seizure threshold  Home Health: No Equipment/Devices: None  Discharge Condition: Stable CODE STATUS: Full code Diet recommendation: Regular diet  History of present illness:  [name] is a 37 y.o. male with past medical history significant for epilepsy s/p vagal nerve stimulator, chronic migraines, hypothyroidism, OSA on CPAP, history of DVT off of anticoagulation who presented to [facility] ED on 2/28 with recurrent breakthrough seizures.  Patient is followed extensively at [facility] by neurology as well as a local neurologist.  Longstanding history of seizure disorder/intractable epilepsy.  Mother reports the patient had an upper respiratory illness last week and has been giving him NyQuil which contains acetaminophen/dextromethorphan and doxylamine which is an antihistamine.  Patient was reported taking NyQuil on Wednesday, Thursday and Friday of last week and started having seizures on Monday and Tuesday as well as day of admission.  Seizures he fell and hit his face.  She has been giving him IV Ativan and IV phenobarbital via his [name] which she accessed at home.   In the ED, temperature 98.7 F, HR 88, RR 16, BP 96/53, SpO2 100% on room air.  WBC 4.4, hemoglobin 13.8, platelets 258.  Sodium 136, potassium 3.3, chloride 105, CO2 20, glucose 94, BUN 11, creatinine 0.94.  AST 26, ALT 25, total bili 0.7.  Urinalysis unrevealing.  CT head without contrast with 4 mm thick hyperdense subdural  collection over the left cerebral convexity without mass effect or midline shift likely subacute to chronic, no evidence of acute blood products, otherwise no acute intracranial abnormality.  Neurology was consulted.  TRH consulted for further evaluation and management of recurrent/breakthrough seizures likely related to antihistamine use  Hospital course:  Generalized convulsive epilepsy with intractable epilepsy Patient presenting to ED with recurrent seizures at home likely related to recent use of antihistamines which lowers seizure threshold.  Patient has been given phenobarbital and Ativan at home via his mother but continued with seizure activity.  Neurology was consulted and followed during hospital course.  Patient was monitored on continuous EEG with no further seizure or elective form discharges shown.  Patient was also started on perampanel 2 mg p.o. nightly and continued on his home [name], Vimpat, Keppra, Tegretol and Trokendi XR.  Outpatient follow-up with neurology.   Hypokalemia Repleted during hospitalization.   S/P placement of VNS (vagus nerve stimulation) device Stable.   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   Migraine without aura and without status migrainosus, not intractable Stable.   Hypothyroidism Stable. Continue 50 mcg synthroid.   OSA on CPAP Continue home CPAP.  Discharge Diagnoses:  Principal Problem:   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[place]) Active Problems:   OSA on CPAP   Hypothyroidism   Chronic migraine without aura with status migrainosus, not intractable   S/P placement of VNS (vagus nerve stimulation) device   Hypokalemia    Discharge Instructions  Discharge Instructions     Call MD for:  difficulty breathing, headache or visual disturbances   Complete by: As directed    Call MD for:  extreme  fatigue   Complete by: As directed    Call MD for:  persistant dizziness or  light-headedness   Complete by: As directed    Call MD for:  persistant nausea and vomiting   Complete by: As directed    Call MD for:  severe uncontrolled pain   Complete by: As directed    Call MD for:  temperature >100.4   Complete by: As directed    Diet - low sodium heart healthy   Complete by: As directed    Increase activity slowly   Complete by: As directed       Allergies as of 11/21/2022       Reactions   Antihistamines, Chlorpheniramine-type Other (See Comments)   Other Reaction: Dimetapp causes seizures   Divalproex Sodium Other (See Comments)   Nausea, vomiting, liver and kidney dysfunction   Phenytoin Rash, Other (See Comments)   Dilantin causes more seizures.   Valproic Acid And Related Other (See Comments)   Shuts down systems   Vancomycin Rash, Other (See Comments), Swelling   If given too fast, causes red man reaction Other Reaction: Redman's Syndrome   Antihistamines, Diphenhydramine-type Other (See Comments)   seizures        Medication List     TAKE these medications    albuterol 1.25 MG/3ML nebulizer solution Commonly known as: ACCUNEB Take 1 ampule by nebulization every 4 (four) hours as needed for wheezing.   carbamazepine 100 MG chewable tablet Commonly known as: TEGRETOL CHEW 2 TABLETS IN THE MORNING, 2 TABLETS AT MIDDAY AND 1 & 1/2 TABLETS IN THE EVENING What changed:  how much to take how to take this when to take this additional instructions   cetirizine 10 MG tablet Commonly known as: ZYRTEC Take 10 mg by mouth daily.   Fish Oil 500 MG Caps Take 500 mg by mouth daily.   Flaxseed Oil 1000 MG Caps Take 1,000 mg by mouth daily.   K-Phos 500 MG tablet Generic drug: potassium phosphate (monobasic) Take 1 tablet (500 mg total) by mouth 4 (four) times daily.   Klor-Con M20 20 MEQ tablet Generic drug: potassium chloride SA TAKE 1 TABLET BY MOUTH EVERY DAY   levETIRAcetam 500 MG tablet Commonly known as: KEPPRA Take  1,000-1,250 mg by mouth in the morning and at bedtime. 1,000 mg (2 tabs) in the morning & 1,250 mg (2.5 tabs) at night   levothyroxine 50 MCG tablet Commonly known as: SYNTHROID Take 50 mcg by mouth daily before breakfast.   LORazepam 2 MG/ML injection Commonly known as: ATIVAN Inject 1 mL (2 mg total) into the vein See admin instructions. Dilute '2mg'$  of lorazepam with 23m of saline.  Administer IV push over 3-5 minutes as needed for seizures.   montelukast 10 MG tablet Commonly known as: SINGULAIR Take 10 mg by mouth at bedtime.   multivitamin with minerals Tabs tablet Take 1 tablet by mouth daily.   [name] 10 MG tablet Generic drug: cloBAZam TAKE 1 TABLET BY MOUTH IN THE MORNING, 1 IN THE AFTERNOON AND 2 AT BEDTIME What changed:  how much to take how to take this when to take this additional instructions   perampanel 2 MG tablet Commonly known as: FYCOMPA Take 1 tablet (2 mg total) by mouth at bedtime.   PHENObarbital 65 MG/ML injection Commonly known as: LUMINAL Withdraw 1777m('65mg'$ ) Phenobarbital and 77m19mormal Saline into 5ml67mringe. Adminster IV push for 3-5 minutes as needed for recurrent seizures   promethazine 25 MG  suppository Commonly known as: PHENERGAN Place 25 mg rectally every 6 (six) hours as needed for nausea or vomiting.   rosuvastatin 10 MG tablet Commonly known as: CRESTOR Take 10 mg by mouth at bedtime.   Trokendi XR 50 MG Cp24 Generic drug: Topiramate ER Take 1 capsule at nighttime   Trokendi XR 200 MG Cp24 Generic drug: Topiramate ER Take 1 capsule by mouth at bedtime.   Viactiv Calcium Plus D 650-12.5-40 MG-MCG-MCG Chew Generic drug: Calcium-Vitamin D-Vitamin K Chew 1 each by mouth daily.   Vimpat 100 MG Tabs Generic drug: Lacosamide TAKE 1 TABLET IN THE MORNING AND TAKE 2 TABLETS AT NIGHT What changed:  how much to take how to take this when to take this additional instructions        Follow-up Information     Chesley Noon,  MD. Schedule an appointment as soon as possible for a visit in 1 week(s).   Specialty: Family Medicine Contact information: [place] [address] [phone]         Franco Nones, MD. Schedule an appointment as soon as possible for a visit.   Specialty: Pediatric Neurology Contact information: [place] [address] [phone]                Allergies  Allergen Reactions   Antihistamines, Chlorpheniramine-Type Other (See Comments)    Other Reaction: Dimetapp causes seizures   Divalproex Sodium Other (See Comments)    Nausea, vomiting, liver and kidney dysfunction   Phenytoin Rash and Other (See Comments)    Dilantin causes more seizures.    Valproic Acid And Related Other (See Comments)    Shuts down systems   Vancomycin Rash, Other (See Comments) and Swelling    If given too fast, causes red man reaction Other Reaction: Redman's Syndrome   Antihistamines, Diphenhydramine-Type Other (See Comments)    seizures    Consultations: Neurology   Procedures/Studies: DG Abd 1 View  Result Date: 11/20/2022 CLINICAL DATA:  Intractable vomiting and nausea EXAM: ABDOMEN - 1 VIEW COMPARISON:  Renal stone CT 06/01/2012 FINDINGS: Gas seen in nondilated loops of small and large bowel. Mild colonic stool. Gas in the rectum. Air in the nondilated stomach. No obstruction. No obvious free air on this portable supine radiograph. The diaphragm is also clipped off the edge of the film. Overlapping cardiac leads. IMPRESSION: Nonspecific bowel gas pattern Electronically Signed   By: Jill Side M.D.   On: 11/20/2022 12:42   Overnight EEG with video  Result Date: 11/20/2022 Lora Havens, MD     11/21/2022 10:41 AM Patient Name: [name] MRN: [id number] Epilepsy Attending: Lora Havens Referring Physician/Provider: Donnetta Simpers, MD Duration: 11/20/2022 0319 to 11/21/2022 0319 Patient history: 37 y.o.  male with PMH significant for intractable epilepsy on Keppra, Vimpat, Tegretol, [name], Trokendi, status post VNS placement, OSA on CPAP, history of developmental delay who presents with multiple breakthrough seizures despite compliance with seizure medication and no obvious signs of infection. EEG to evaluate for seizure Level of alertness: Awake, asleep AEDs during EEG study: Keppra, Vimpat, trokendi, Tegretol, [name], Perampanel Technical aspects: This EEG study was done with scalp electrodes positioned according to the 10-20 International system of electrode placement. Electrical activity was reviewed with band pass filter of 1-'70Hz'$ , sensitivity of 7 uV/mm, display speed of 21m/sec with a '60Hz'$  notched filter applied as appropriate. EEG data were recorded continuously and digitally stored.  Video monitoring was  available and reviewed as appropriate. Description: The posterior dominant rhythm consists of 9 Hz activity of moderate voltage (25-35 uV) seen predominantly in posterior head regions, symmetric and reactive to eye opening and eye closing. Sleep was characterized by vertex waves, sleep spindles (12 to 14 Hz), maximal frontocentral region. EEG showed intermittent generalized and maximal left temporal 3 to 6 Hz theta-delta slowing. Hyperventilation and photic stimulation were not performed.   ABNORMALITY - Intermittent slow, generalized and maximal left temporal IMPRESSION: This study is suggestive of non-specific cortical dysfunction arising from left temporal region. Additionally there is mild diffuse encephalopathy, nonspecific etiology. No seizures or epileptiform discharges were seen throughout the recording. Please note lack of epileptiform activity during interictal EEG does not exclude the diagnosis of epilepsy. Lora Havens   CT Head Wo Contrast  Result Date: 11/19/2022 CLINICAL DATA:  Seizures. EXAM: CT HEAD WITHOUT CONTRAST TECHNIQUE: Contiguous axial images were obtained from the base of the  skull through the vertex without intravenous contrast. RADIATION DOSE REDUCTION: This exam was performed according to the departmental dose-optimization program which includes automated exposure control, adjustment of the mA and/or kV according to patient size and/or use of iterative reconstruction technique. 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 There is no mass effect on the underlying brain parenchyma or midline shift. There is no other evidence of acute intracranial hemorrhage or extra-axial fluid collection Parenchymal volume is normal. The ventricles are normal in size. Gray-white differentiation is preserved. 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 The sella appears small, unchanged. There is no evidence of suprasellar mass lesion. There is no mass effect or midline shift. Vascular: No hyperdense vessel or unexpected calcification. Skull: There is no calvarial fracture or suspicious osseous lesion. There are remote bilateral nasal bone fractures. Sinuses/Orbits: The paranasal sinuses are clear. Bilateral lens implants are in place. The globes and orbits are otherwise unremarkable. Other: None. IMPRESSION: 1. 4 mm thick hypodense subdural collection over the left cerebral convexity without mass effect or midline shift is likely subacute to chronic. No evidence of acute blood products. 2. Otherwise, no evidence of acute intracranial pathology. 3. Unchanged dense basal ganglia calcifications. Electronically Signed   By: Valetta Mole M.D.   On: 11/19/2022 18:46     Subjective: Patient seen examined bedside, resting comfortably.  Lying in bed.  Mother present.  Seen by neurology and okay for discharge home on added perampanel for his recurrent seizures.  Continue  to discuss antihistamine avoidance as this lowers seizure threshold.  No other questions or concerns at this time.  Denies headache, no chest pain, no shortness of breath, no abdominal pain.  No acute concerns overnight per nursing staff.  Discharge Exam: Vitals:   11/21/22 0303 11/21/22 0827  BP: 91/71 (!) 87/74  Pulse: 63 84  Resp: 14 20  Temp: 98.7 F (37.1 C) 97.9 F (36.6 C)  SpO2: 100% 100%   Vitals:   11/20/22 2326 11/21/22 0303 11/21/22 0500 11/21/22 0827  BP: (!) 87/64 91/71  (!) 87/74  Pulse: 67 63  84  Resp: '15 14  20  '$ Temp:  98.7 F (37.1 C)  97.9 F (36.6 C)  TempSrc:  Axillary  Oral  SpO2: 100% 100%  100%  Weight:   57.1 kg   Height:  Physical Exam: GEN: NAD, alert  HEENT: NCAT, PERRL, EOMI, sclera clear, MMM PULM: CTAB w/o wheezes/crackles, normal respiratory effort, on room air CV: RRR w/o M/G/R GI: abd soft, NTND, NABS, no R/G/M MSK: no peripheral edema, moves all extremities independently    The results of significant diagnostics from this hospitalization (including imaging, microbiology, ancillary and laboratory) are listed below for reference.     Microbiology: No results found for this or any previous visit (from the past 240 hour(s)).   Labs: BNP (last 3 results) No results for input(s): "BNP" in the last 8760 hours. Basic Metabolic Panel: Recent Labs  Lab 11/19/22 1726 11/20/22 0642 11/21/22 0658  NA 136 137 139  K 3.3* 3.3* 3.8  CL 105 109 112*  CO2 20* 22 22  GLUCOSE 94 96 102*  BUN '11 11 10  '$ CREATININE 0.94 0.97 0.93  CALCIUM 8.7* 8.4* 8.6*  MG  --  2.0 2.0   Liver Function Tests: Recent Labs  Lab 11/19/22 1726 11/20/22 0642  AST 26 20  ALT 25 24  ALKPHOS 76 69  BILITOT 0.7 0.5  PROT 7.0 6.5  ALBUMIN 3.7 3.3*   No results for input(s): "LIPASE", "AMYLASE" in the last 168 hours. No results for input(s): "AMMONIA" in the last 168 hours. CBC: Recent Labs  Lab 11/19/22 1726 11/20/22 0642  WBC 4.4 3.4*   NEUTROABS 2.0 1.3*  HGB 13.8 12.8*  HCT 40.4 37.8*  MCV 84.7 84.8  PLT 258 233   Cardiac Enzymes: No results for input(s): "CKTOTAL", "CKMB", "CKMBINDEX", "TROPONINI" in the last 168 hours. BNP: Invalid input(s): "POCBNP" CBG: Recent Labs  Lab 11/19/22 1740  GLUCAP 89   D-Dimer No results for input(s): "DDIMER" in the last 72 hours. Hgb A1c No results for input(s): "HGBA1C" in the last 72 hours. Lipid Profile No results for input(s): "CHOL", "HDL", "LDLCALC", "TRIG", "CHOLHDL", "LDLDIRECT" in the last 72 hours. Thyroid function studies No results for input(s): "TSH", "T4TOTAL", "T3FREE", "THYROIDAB" in the last 72 hours.  Invalid input(s): "FREET3" Anemia work up No results for input(s): "VITAMINB12", "FOLATE", "FERRITIN", "TIBC", "IRON", "RETICCTPCT" in the last 72 hours. Urinalysis    Component Value Date/Time   COLORURINE STRAW (A) 11/19/2022 1726   APPEARANCEUR CLEAR 11/19/2022 1726   LABSPEC 1.005 11/19/2022 1726   PHURINE 7.0 11/19/2022 1726   GLUCOSEU NEGATIVE 11/19/2022 1726   HGBUR NEGATIVE 11/19/2022 1726   BILIRUBINUR NEGATIVE 11/19/2022 1726   KETONESUR NEGATIVE 11/19/2022 1726   PROTEINUR NEGATIVE 11/19/2022 1726   UROBILINOGEN 0.2 04/21/2014 2218   NITRITE NEGATIVE 11/19/2022 1726   LEUKOCYTESUR NEGATIVE 11/19/2022 1726   Sepsis Labs Recent Labs  Lab 11/19/22 1726 11/20/22 0642  WBC 4.4 3.4*   Microbiology No results found for this or any previous visit (from the past 240 hour(s)).   Time coordinating discharge: Over 30 minutes  SIGNED:   Donnamarie Poag [place] DO  [facility] 11/21/2022, 12:05 PM

## 2022-11-21 NOTE — TOC Benefit Eligibility Note (Signed)
Patient Teacher, English as a foreign language completed.    The patient is currently admitted and upon discharge could be taking Fycompa 2 mg tablets.  The current 30 day co-pay is $0.00.   The patient is insured through [place], [place] Patient Advocate Specialist [place] Patient Advocate Team Direct Number: [phone]  Fax: [phone]

## 2022-11-21 NOTE — Progress Notes (Signed)
LTM maint complete - no skin breakdown under: p3,p4

## 2022-11-21 NOTE — Progress Notes (Signed)
LTM EEG discontinued - no skin breakdown at unhook.   

## 2022-11-21 NOTE — Procedures (Addendum)
Patient Name: [name]  MRN: [id number]  Epilepsy Attending: Lora Havens  Referring Physician/Provider: Donnetta Simpers, MD  Duration: 11/21/2022 0319 to 11/21/2022 1144   Patient history: 37 y.o. male with PMH significant for intractable epilepsy on Keppra, Vimpat, Tegretol, [name], Trokendi, status post VNS placement, OSA on CPAP, history of developmental delay who presents with multiple breakthrough seizures despite compliance with seizure medication and no obvious signs of infection. EEG to evaluate for seizure   Level of alertness: Awake, asleep   AEDs during EEG study: Keppra, Vimpat, trokendi, Tegretol, [name], Perampanel   Technical aspects: This EEG study was done with scalp electrodes positioned according to the 10-20 International system of electrode placement. Electrical activity was reviewed with band pass filter of 1-'70Hz'$ , sensitivity of 7 uV/mm, display speed of 81m/sec with a '60Hz'$  notched filter applied as appropriate. EEG data were recorded continuously and digitally stored.  Video monitoring was available and reviewed as appropriate.   Description: The posterior dominant rhythm consists of 9 Hz activity of moderate voltage (25-35 uV) seen predominantly in posterior head regions, symmetric and reactive to eye opening and eye closing. Sleep was characterized by vertex waves, sleep spindles (12 to 14 Hz), maximal frontocentral region. EEG showed intermittent generalized and maximal left temporal 3 to 6 Hz theta-delta slowing. Hyperventilation and photic stimulation were not performed.      ABNORMALITY - Intermittent slow, generalized and maximal left temporal   IMPRESSION: This study is suggestive of non-specific cortical dysfunction arising from left temporal region. Additionally there is mild diffuse encephalopathy, nonspecific etiology. No seizures or epileptiform discharges were seen throughout the recording.   Please note lack of epileptiform activity during  interictal EEG does not exclude the diagnosis of epilepsy.   [name] [name]

## 2022-11-21 NOTE — Progress Notes (Signed)
Subjective: No further seizures. Back to baseline per parents  ROS: negative except above  Examination  Vital signs in last 24 hours: Temp:  [97 F (36.1 C)-98.7 F (37.1 C)] 97.9 F (36.6 C) (03/01 0827) Pulse Rate:  [63-84] 84 (03/01 0827) Resp:  [14-20] 20 (03/01 0827) BP: (78-92)/(55-74) 87/74 (03/01 0827) SpO2:  [97 %-100 %] 100 % (03/01 0827) Weight:  [57.1 kg] 57.1 kg (03/01 0500)  General: lying in bed, NAD CVS: pulse-normal rate and rhythm RS: breathing comfortably Extremities: normal    Neuro: MS: Alert, oriented to self, follows commands CN: pupils equal and reactive,  EOMI, face symmetric, tongue midline, normal sensation over face, Motor: 4/5 strength in all 4 extremities with left upper extremity paresis and increased tone at baseline due to cerebral palsy  Basic Metabolic Panel: Recent Labs  Lab 11/19/22 1726 11/20/22 0642 11/21/22 0658  NA 136 137 139  K 3.3* 3.3* 3.8  CL 105 109 112*  CO2 20* 22 22  GLUCOSE 94 96 102*  BUN '11 11 10  '$ CREATININE 0.94 0.97 0.93  CALCIUM 8.7* 8.4* 8.6*  MG  --  2.0 2.0    CBC: Recent Labs  Lab 11/19/22 1726 11/20/22 0642  WBC 4.4 3.4*  NEUTROABS 2.0 1.3*  HGB 13.8 12.8*  HCT 40.4 37.8*  MCV 84.7 84.8  PLT 258 233    Coagulation Studies: No results for input(s): "LABPROT", "INR" in the last 72 hours.  Imaging No new brain imaging  ASSESSMENT AND PLAN: 37yo M with medically refractory epilepsy s/p VNS who presented with increased frequency of seizures.   Epilepsy with breakthrough seizures - no further seizures overnight. No clear etiology for increased frequency of seizures - Continue home Keppra, Vimpat, trokendi, Tegretol and [name]  - Continue Perampanel '2mg'$  qhs, can eventually dc if no seizure recurrence - DC LTM eeg - Will request Dr A to f/u with him in her clinic in next 2-4 weeks - continue seizure precautions. Patient doesn't drive - prn IV ativan or versed for clinical seizures - Management  of rest of comorbidity per primary team  Seizure precautions:  Use caution when using heavy equipment or power tools. Avoid working on ladders or at heights. Take showers instead of baths. Ensure the water temperature is not too high on the home water heater. Do not go swimming alone. Do not lock yourself in a room alone (i.e. bathroom). When caring for infants or small children, sit down when holding, feeding, or changing them to minimize risk of injury to the child in the event you have a seizure. Maintain good sleep hygiene. Avoid alcohol.    If patient has another seizure, call 911 and bring them back to the ED if: A.  The seizure lasts longer than 5 minutes.      B.  The patient doesn't wake shortly after the seizure or has new problems such as difficulty seeing, speaking or moving following the seizure C.  The patient was injured during the seizure D.  The patient has a temperature over 102 F (39C) E.  The patient vomited during the seizure and now is having trouble breathing    During the Seizure   - First, ensure adequate ventilation and place patients on the floor on their left side  Loosen clothing around the neck and ensure the airway is patent. If the patient is clenching the teeth, do not force the mouth open with any object as this can cause severe damage - Remove all  items from the surrounding that can be hazardous. The patient may be oblivious to what's happening and may not even know what he or she is doing. If the patient is confused and wandering, either gently guide him/her away and block access to outside areas - Reassure the individual and be comforting - Call 911. In most cases, the seizure ends before EMS arrives. However, there are cases when seizures may last over 3 to 5 minutes. Or the individual may have developed breathing difficulties or severe injuries. If a pregnant patient or a person with diabetes develops a seizure, it is prudent to call an ambulance. - Finally,  if the patient does not regain full consciousness, then call EMS. Most patients will remain confused for about 45 to 90 minutes after a seizure, so you must use judgment in calling for help.   After the Seizure (Postictal Stage)   After a seizure, most patients experience confusion, fatigue, muscle pain and/or a headache. Thus, one should permit the individual to sleep. For the next few days, reassurance is essential. Being calm and helping reorient the person is also of importance.   Most seizures are painless and end spontaneously. Seizures are not harmful to others but can lead to complications such as stress on the lungs, brain and the heart. Individuals with prior lung problems may develop labored breathing and respiratory distress.    I have spent a total of  38 minutes with the patient reviewing hospital notes,  test results, labs and examining the patient as well as establishing an assessment and plan that was discussed personally with the patient.  > 50% of time was spent in direct patient care.  Zeb Comfort Ep[facility] For questions after 5pm please refer to AMION to reach the Neurologist on call

## 2022-11-23 ENCOUNTER — Other Ambulatory Visit (INDEPENDENT_AMBULATORY_CARE_PROVIDER_SITE_OTHER): Payer: Self-pay | Admitting: Pediatrics

## 2022-11-23 DIAGNOSIS — E876 Hypokalemia: Secondary | ICD-10-CM

## 2022-11-26 NOTE — Progress Notes (Signed)
[name]   MRN:  [id number]  [birth year]   Provider: Rockwell Germany NP-C Location of Care: [facility]  Visit type: Hospital follow up return visit  Last visit: 04/25/2022  Referral source: Chesley Noon, MD History from: Epic chart and patient's mother   Brief history:  Copied from previous record: History of intractable epilepsy including focal epilepsy with impairment of consciousness, and generalized tonic-clonic seizures with and without apnea; s/p VNS implant; migraine headaches; developmental and intellectual delays; hypothyroidism; growth hormone deficiency; mitral valve disease; and obstructive sleep apnea on CPAP.    Today's concerns: Hospitalized 11/19/2022 - 11/21/2022 for seizures. Had seizures at home after taking cold medication for several days. Family accessed portacath and gave Ativan and Phenobarbital but seizures continued. Fell at one point during a seizure and struck his head. Had vomiting the day after the seizures and head injury. CT head was negative for acute intracranial pathology. Was started on Fycompa in the hospital and has tolerated it with no side effects.  Has follow up with Dr Lisabeth Devoid at [facility] in August [name] has been otherwise generally healthy since he was last seen. No health concerns today other than previously mentioned.  Review of systems: Please see HPI for neurologic and other pertinent review of systems. Otherwise all other systems were reviewed and were negative.  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   Hypokalemia 08/03/2013   Hypothyroidism 08/02/2013   Contracture of ankle and foot joint 03/29/2013   Flexion contracture of ankle or foot joint 03/29/2013   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[place]) 12/21/2012   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   ALLERGIC RHINITIS 12/26/2009   Atrial fibrillation ([place]) 04/08/2012   Formatting of this note might be different from the original.  Last Assessment & Plan:   One episode during a procedure in July. Likely induced by the wire. No recurrence. He is in normal sinus rhythm today. No further workup or treatment.   Overview:   Paroxysmal - occurred during a procedure - has been anticoagulated with Coumadin but initiated due to DVT  Formatting of this note might be differ   Blood clot in vein 2015   Brown's syndrome    Complication of anesthesia    seizure after pac surgery at [place] 2013 had to be intubated, no other anes issurs   Growth disorder  Headache    migraines   Heart murmur    moderate MR ses dr Darcus Pester   Hyperlipidemia    No therapy   Hypokalemia    Mental retardation, mild (I.Q. 50-70)    MITRAL VALVE PROLAPSE 06/22/2007   SEIZURE DISORDER 06/22/2007   last seizure 08-02-2019   Sleep apnea     cpap , last sleep study 10/01/11   Unspecified hypothyroidism 06/22/2007    Past medical history comments: See HPI  Surgical history: Past Surgical History:  Procedure Laterality Date   ANKLE SURGERY Left    fused does not bend, wears right leg brace 2018 at [place]  08/22/2019   Procedure: BIOPSY;  Surgeon: Mauri Pole, MD;  Location: [facility];  Service: Endoscopy;;   BIOPSY  10/02/2022   Procedure: BIOPSY;  Surgeon: Mauri Pole, MD;  Location: [facility];  Service: Gastroenterology;;   CATARACT EXTRACTION     2021   COLONOSCOPY WITH PROPOFOL N/A 08/22/2019   Procedure: COLONOSCOPY WITH PROPOFOL;  Surgeon: Mauri Pole, MD;  Location: [facility];  Service: Endoscopy;  Laterality: N/A;   COLONOSCOPY WITH PROPOFOL N/A 10/02/2022   Procedure: COLONOSCOPY WITH PROPOFOL;  Surgeon: Mauri Pole, MD;  Location: [facility];  Service: Gastroenterology;  Laterality: N/A;   EYE SURGERY     X2 for Brown Syndrome   HEMOSTASIS CLIP PLACEMENT  08/22/2019   Procedure: HEMOSTASIS CLIP PLACEMENT;  Surgeon: Mauri Pole, MD;  Location: [facility];  Service: Endoscopy;;   IMPLANTATION VAGAL NERVE STIMULATOR     Left leaves on for surgeries, new battery 2022, first put in 2005.   POLYPECTOMY  08/22/2019   Procedure: POLYPECTOMY;  Surgeon: Mauri Pole, MD;  Location: [facility];  Service: Endoscopy;;   PORTACATH PLACEMENT  2013 and removed and new placed   right chest     Family history: family history includes Atrial fibrillation in his father; Breast cancer in his mother; Clotting disorder in his mother; Colon polyps in his father and mother; Congestive Heart Failure in his maternal grandfather; Coronary artery disease in his maternal grandfather and paternal grandfather; Diabetes in his father, maternal grandfather, maternal grandmother, paternal grandfather, and paternal grandmother; Heart disease in his maternal grandfather, maternal  grandmother, paternal grandfather, and paternal grandmother; Hyperlipidemia in his father; Hypertension in his father; Lung cancer in his paternal grandfather; Pneumonia in his maternal grandfather and paternal grandmother; Stroke in his paternal grandmother.   Social history: Social History   Socioeconomic History   Marital status: Single    Spouse name: Not on file   Number of children: 0   Years of education: Not on file   Highest education level: Not on file  Occupational History   Occupation: Disabled    Employer: UNEMPLOYED  Tobacco Use   Smoking status: Never    Passive exposure: Never   Smokeless tobacco: Never  Vaping Use   Vaping Use: Never used  Substance and Sexual Activity   Alcohol use: No    Alcohol/week: 0.0 standard drinks of alcohol   Drug use: No   Sexual activity: Never  Other Topics Concern   Not on file  Social History Narrative   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 Disabled.    He lives with his parents.    He enjoys helping around the house, Nascar, and football.   Caffiene soda 's with caffeien 2-3 / day  Social Determinants of Health   Financial Resource Strain: Not on file  Food Insecurity: No Food Insecurity (06/01/2022)   Hunger Vital Sign    Worried About Running Out of Food in the Last Year: Never true    Ran Out of Food in the Last Year: Never true  Transportation Needs: No Transportation Needs (06/01/2022)   PRAPARE - Hydrologist (Medical): No    Lack of Transportation (Non-Medical): No  Physical Activity: Not on file  Stress: Not on file  Social Connections: Not on file  Intimate Partner Violence: Unknown (06/01/2022)   Humiliation, Afraid, Rape, and Kick questionnaire    Fear of Current or Ex-Partner: Patient refused    Emotionally Abused: Patient refused    Physically Abused: Patient refused    Sexually Abused: Patient refused      Past/failed meds: Copied from previous record: Tried Gabapentin, Tegretol, Lamictal, Felbatol, Depakote, Topamax, Keppra, [name], Phenobarbital, Vimpat, Diastat, Dilantin Allergic to Dilantin and Depakote  Allergies: Allergies  Allergen Reactions   Antihistamines, Chlorpheniramine-Type Other (See Comments)    Other Reaction: Dimetapp causes seizures   Divalproex Sodium Other (See Comments)    Nausea, vomiting, liver and kidney dysfunction   Phenytoin Rash and Other (See Comments)    Dilantin causes more seizures.    Valproic Acid And Related Other (See Comments)    Shuts down systems   Vancomycin Rash, Other (See Comments) and Swelling    If given too fast, causes red man reaction Other Reaction: Redman's Syndrome   Antihistamines, Diphenhydramine-Type Other (See Comments)    seizures    Immunizations: Immunization History  Administered Date(s) Administered   Influenza Whole 07/11/2008, 08/06/2010   Influenza,inj,Quad PF,6+ Mos 08/04/2013, 06/14/2016   Pneumococcal Polysaccharide-23 11/05/2015   Td 12/07/2008    Diagnostics/Screenings: Copied from previous record: 11/20/2022 - Overnight EEG with video - This study is suggestive of non-specific cortical dysfunction arising from left temporal region. Additionally there is mild diffuse encephalopathy, nonspecific etiology. No seizures or epileptiform discharges were seen throughout the recording. Please note lack of epileptiform activity during interictal EEG does not exclude the diagnosis of epilepsy. Priyanka Barbra Sarks   Nocturnal polysomnogram at [facility] 10/01/2011 - abnormal   Physical Exam: BP 102/62 (BP Location: Left Arm, Patient Position: Sitting, Cuff Size: Small)   Pulse 96   Ht 5' 1.02" (1.55 m)   Wt 122 lb 3.2 oz (55.4 kg)   BMI 23.07 kg/m   General: well developed, well nourished man, seated in exam room, in no evident distress Head: normocephalic and atraumatic. Oropharynx benign. No dysmorphic  features. Neck: supple but large for his body size Cardiovascular: regular rate and rhythm, no murmurs. Respiratory: clear to auscultation bilaterally Abdomen: bowel sounds present all four quadrants, abdomen soft, non-tender, non-distended. Musculoskeletal: no skeletal deformities or obvious scoliosis. Has increased tone in his hands and right lower extremity. Wears right AFO.  Skin: no rashes or neurocutaneous lesions  Neurologic Exam Mental Status: awake and fully alert. Has limited language.  Able to follow simple instructions and participate in examination Cranial Nerves: fundoscopic exam - red reflex present.  Unable to fully visualize fundus.  Pupils equal briskly reactive to light.  Turns to localize faces and objects in the periphery. Turns to localize sounds in the periphery. Facial movements are symmetric. Motor: normal functional bulk, tone and strength except for hands and right lower extremity Sensory: withdrawal x 4 Coordination: unable to adequately assess due to patient's inability to  participate in examination. No dysmetria when reaching for objects. Gait and Station: clumsy gait and poor balance Reflexes: diminished and symmetric. Toes neutral. 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 Recent diagnostic studies from hospitalization reviewed with the patient's mother. I talked with her about referral to Dr Delice Lesch to have a local adult epilepsy provider and she agreed to consider it as access to Dr Lisabeth Devoid can be difficult due to distance. Plan until next visit: Continue medications as prescribed. Mom wants to  continue the Fycompa that was started in the hospital for now. We agreed to reassess in 2 months.  Keep track of seizures so we can determine if the Fycompa has been helpful. Continue CPAP for OSA Call for questions or concerns Return in about 2 months (around 01/27/2023).  The medication list was reviewed and reconciled. No changes were made in the prescribed medications today. A complete medication list was provided to the patient.  Allergies as of 11/27/2022       Reactions   Antihistamines, Chlorpheniramine-type Other (See Comments)   Other Reaction: Dimetapp causes seizures   Divalproex Sodium Other (See Comments)   Nausea, vomiting, liver and kidney dysfunction   Phenytoin Rash, Other (See Comments)   Dilantin causes more seizures.   Valproic Acid And Related Other (See Comments)   Shuts down systems   Vancomycin Rash, Other (See Comments), Swelling   If given too fast, causes red man reaction Other Reaction: Redman's Syndrome   Antihistamines, Diphenhydramine-type Other (See Comments)   seizures        Medication List        Accurate as of November 26, 2022  7:50 PM. If you have any questions, ask your nurse or doctor.          albuterol 1.25 MG/3ML nebulizer solution Commonly known as: ACCUNEB Take 1 ampule by nebulization every 4 (four) hours as needed for wheezing.   carbamazepine 100 MG chewable tablet Commonly known as: TEGRETOL CHEW 2 TABLETS IN THE MORNING, 2 TABLETS AT MIDDAY AND 1 & 1/2 TABLETS IN THE EVENING What changed:  how much to take how to take this when to take this additional instructions   cetirizine 10 MG tablet Commonly known as: ZYRTEC Take 10 mg by mouth daily.   Fish Oil 500 MG Caps Take 500 mg by mouth daily.   Flaxseed Oil 1000 MG Caps Take 1,000 mg by mouth daily.   K-Phos 500 MG tablet Generic drug: potassium phosphate (monobasic) TAKE 1 TABLET BY MOUTH FOUR TIMES A DAY   Klor-Con M20 20 MEQ tablet Generic drug: potassium  chloride SA TAKE 1 TABLET BY MOUTH EVERY DAY   levETIRAcetam 500 MG tablet Commonly known as: KEPPRA Take 1,000-1,250 mg by mouth in the morning and at bedtime. 1,000 mg (2 tabs) in the morning & 1,250 mg (2.5 tabs) at night   levothyroxine 50 MCG tablet Commonly known as: SYNTHROID Take 50 mcg by mouth daily before breakfast.   LORazepam 2 MG/ML injection Commonly known as: ATIVAN Inject 1 mL (2 mg total) into the vein See admin instructions. Dilute '2mg'$  of lorazepam with 93m of saline.  Administer IV push over 3-5 minutes as needed for  seizures.   montelukast 10 MG tablet Commonly known as: SINGULAIR Take 10 mg by mouth at bedtime.   multivitamin with minerals Tabs tablet Take 1 tablet by mouth daily.   [name] 10 MG tablet Generic drug: cloBAZam TAKE 1 TABLET BY MOUTH IN THE MORNING, 1 IN THE AFTERNOON AND 2 AT BEDTIME What changed:  how much to take how to take this when to take this additional instructions   perampanel 2 MG tablet Commonly known as: FYCOMPA Take 1 tablet (2 mg total) by mouth at bedtime.   PHENObarbital 65 MG/ML injection Commonly known as: LUMINAL Withdraw 364m ('65mg'$ ) Phenobarbital and 433mNormal Saline into 64m46myringe. [place] IV push for 3-5 minutes as needed for recurrent seizures   promethazine 25 MG suppository Commonly known as: PHENERGAN Place 25 mg rectally every 6 (six) hours as needed for nausea or vomiting.   rosuvastatin 10 MG tablet Commonly known as: CRESTOR Take 10 mg by mouth at bedtime.   Trokendi XR 50 MG Cp24 Generic drug: Topiramate ER Take 1 capsule at nighttime   Trokendi XR 200 MG Cp24 Generic drug: Topiramate ER Take 1 capsule by mouth at bedtime.   Viactiv Calcium Plus D 650-12.5-40 MG-MCG-MCG Chew Generic drug: Calcium-Vitamin D-Vitamin K Chew 1 each by mouth daily.   Vimpat 100 MG Tabs Generic drug: Lacosamide TAKE 1 TABLET IN THE MORNING AND TAKE 2 TABLETS AT NIGHT What changed:  how much to take how to  take this when to take this additional instructions      Total time spent with the patient was 30 minutes, of which 50% or more was spent in counseling and coordination of care.  TinRockwell Germany-C [facility] [id number] [place] [id number]. [phone]

## 2022-11-27 ENCOUNTER — Encounter (INDEPENDENT_AMBULATORY_CARE_PROVIDER_SITE_OTHER): Payer: Self-pay | Admitting: Family

## 2022-11-27 ENCOUNTER — Ambulatory Visit (INDEPENDENT_AMBULATORY_CARE_PROVIDER_SITE_OTHER): Payer: Medicaid Other | Admitting: Family

## 2022-11-27 VITALS — BP 102/62 | HR 96 | Ht 61.02 in | Wt 122.2 lb

## 2022-11-27 DIAGNOSIS — G40319 Generalized idiopathic epilepsy and epileptic syndromes, intractable, without status epilepticus: Secondary | ICD-10-CM

## 2022-11-27 DIAGNOSIS — F70 Mild intellectual disabilities: Secondary | ICD-10-CM

## 2022-11-27 DIAGNOSIS — G4733 Obstructive sleep apnea (adult) (pediatric): Secondary | ICD-10-CM | POA: Diagnosis not present

## 2022-11-27 DIAGNOSIS — G40119 Localization-related (focal) (partial) symptomatic epilepsy and epileptic syndromes with simple partial seizures, intractable, without status epilepticus: Secondary | ICD-10-CM

## 2022-11-27 DIAGNOSIS — Z9689 Presence of other specified functional implants: Secondary | ICD-10-CM

## 2022-11-27 DIAGNOSIS — R2689 Other abnormalities of gait and mobility: Secondary | ICD-10-CM

## 2022-11-27 NOTE — Patient Instructions (Addendum)
It was a pleasure to see you today!  Instructions for you until your next appointment are as follows: Continue medications as prescribed for now. Watch for side effects from the new medication - Fycompa. If you notice any side effects, let me know.  Think about referral to Dr Delice Lesch as we discussed today. Call for any questions or concerns Please sign up for MyChart if you have not done so. Please plan to return for follow up in 2 months or sooner if needed.  Feel free to contact our office during normal business hours at [phone] with questions or concerns. If there is no answer or the call is outside business hours, please leave a message and our clinic staff will call you back within the next business day.  If you have an urgent concern, please stay on the line for our after-hours answering service and ask for the on-call neurologist.     I also encourage you to use MyChart to communicate with me more directly. If you have not yet signed up for MyChart within [facility], the front desk staff can help you. However, please note that this inbox is NOT monitored on nights or weekends, and response can take up to 2 business days.  Urgent matters should be discussed with the on-call pediatric neurologist.   At [facility], we are committed to providing exceptional care. You will receive a patient satisfaction survey through text or email regarding your visit today. Your opinion is important to me. Comments are appreciated.

## 2022-12-04 ENCOUNTER — Encounter (INDEPENDENT_AMBULATORY_CARE_PROVIDER_SITE_OTHER): Payer: Self-pay | Admitting: Family

## 2022-12-04 DIAGNOSIS — G4733 Obstructive sleep apnea (adult) (pediatric): Secondary | ICD-10-CM | POA: Insufficient documentation

## 2022-12-15 ENCOUNTER — Telehealth (INDEPENDENT_AMBULATORY_CARE_PROVIDER_SITE_OTHER): Payer: Self-pay | Admitting: Family

## 2022-12-15 ENCOUNTER — Other Ambulatory Visit: Payer: Self-pay | Admitting: Gastroenterology

## 2022-12-15 DIAGNOSIS — G40319 Generalized idiopathic epilepsy and epileptic syndromes, intractable, without status epilepticus: Secondary | ICD-10-CM

## 2022-12-15 DIAGNOSIS — G40119 Localization-related (focal) (partial) symptomatic epilepsy and epileptic syndromes with simple partial seizures, intractable, without status epilepticus: Secondary | ICD-10-CM

## 2022-12-15 DIAGNOSIS — Z9689 Presence of other specified functional implants: Secondary | ICD-10-CM

## 2022-12-15 NOTE — Telephone Encounter (Signed)
[name] contacted me to report that since his last visit, [name] has had 3 grand mal seizures lasting 1 1/2 - 2 1/2 minutes each. She doesn't feel that Fycompa that was added at his recent hospitalization has been helpful. I recommended stopping it because he has been more ataxic when walking since starting the Fycompa. Mom agreed with this plan. I also reminded Mom of my recommendation at his last visit to refer [name] to see Dr Ellouise Newer at [facility]. She will let me know about that. TG

## 2022-12-15 NOTE — Telephone Encounter (Signed)
Mom contacted me and agreed to the referral for [facility] to see Dr Delice Lesch. Referral placed. TG

## 2022-12-16 NOTE — Addendum Note (Signed)
Addended by: Joelyn Oms on: 12/16/2022 08:10 AM   Modules accepted: Orders

## 2022-12-16 NOTE — Telephone Encounter (Signed)
Dr Aquino's office notified me that they would not accept the referral so I placed a new referral for [facility]. TG

## 2023-01-28 NOTE — Patient Instructions (Incomplete)
Please continue using your CPAP regularly. While your insurance requires that you use CPAP at least 4 hours each night on 70% of the nights, I recommend, that you not skip any nights and use it throughout the night if you can. Getting used to CPAP and staying with the treatment long term does take time and patience and discipline. Untreated obstructive sleep apnea when it is moderate to severe can have an adverse impact on cardiovascular health and raise her risk for heart disease, arrhythmias, hypertension, congestive heart failure, stroke and diabetes. Untreated obstructive sleep apnea causes sleep disruption, nonrestorative sleep, and sleep deprivation. This can have an impact on your day to day functioning and cause daytime sleepiness and impairment of cognitive function, memory loss, mood disturbance, and problems focussing. Using CPAP regularly can improve these symptoms.  We will update supply orders, today.   Follow up in 1 year   

## 2023-01-28 NOTE — Progress Notes (Unsigned)
PATIENT: [name] DOB: 15-Mar-1986  REASON FOR VISIT: follow up HISTORY FROM: patient  No chief complaint on file.    HISTORY OF PRESENT ILLNESS:  01/28/23 ALL:  [name] is a 37 y.o. male here today for follow up for OSA on CPAP. He was seen in consult with Dr Frances Furbish 08/2022 and needing new machine. HST 09/2022 showed severe obstructive sleep apnea with a total AHI of 39/hour and O2 nadir of 64% with significant time below or at 88% saturation of over 90 minutes, indicating nocturnal hypoxemia. Marland Kitchen   Refractory seizure and migraine management through Dr Daneen Schick at [facility]. He is also followed Q20mth with Dr Moody Bruins with Pediatric Neurology per family preference.   HISTORY: (copied from Dr Teofilo Pod previous note)  Dear Inetta Fermo,  The patient, [name], upon your kind request in my sleep clinic today for initial consultation of his sleep disorder, in particular, his prior diagnosis of obstructive sleep apnea.  The patient is accompanied by his parents today.  He missed an appointment on 07/30/2022. As you know, [name] is a 37 year old male with an underlying medical history of seizure disorder, status post [name] placement, s/p VNS, intellectual disability, Brown syndrome, allergic rhinitis, history of blood clot, headaches, hyperlipidemia, mitral valve prolapse, and hypothyroidism, who was previously diagnosed with obstructive sleep apnea.  Patient is unable to provide a detailed history but is able to answer simple questions appropriately.  History is provided by both parents.  He is compliant with his CPAP machine, he uses nasal pillows.  His machine has made a louder noise which is unusual and also they have seen an error message that the motor life has been exceeded.  His bedtime is generally around 8:30 PM or 9 PM and rise time around 8 AM.  He does not have nightly nocturia.  He does not have recurrent headaches.  He lives with his parents and  his seizure dog Apollo.  His DME company is [employer].  4 nights a week he spends at his sister's house.  She has 2 daughters, ages 34 and 2 and he likes to spend time with them. Both parents have sleep apnea, father still uses a CPAP machine, mom no longer has to use her machine.  I reviewed your office note from 04/25/2022.  He was diagnosed about 10 years ago.  He has an older CPAP machine.  He has had weight gain since his original sleep apnea diagnosis.  His Epworth sleepiness score is 11 out of 24, fatigue severity score is 63 out of 63.  He lives with his parents.  He is on disability, he has no children.  He is a non-smoker and does not utilize alcohol and drinks caffeine in the form of sodas, 2 to 3/day on average. Reviewed his split-night sleep study report from 10/01/2011.  Study was interpreted by Dr. Porfirio Mylar Dohmeier, baseline AHI was 33.1/h, O2 nadir 68%.  He had a split-night sleep study.  Optimal pressure was 8 cm.  I was able to review his compliance data from his current CPAP machine, he has a ResMed air sense 10 CPAP machine, compliance data from 08/10/2022 through 09/08/2022 showed 100% compliance.  Average usage of 11 hours and 8 minutes, residual AHI at goal at 0.6/h, leak acceptable with the 95th percentile at 9 L/min on a pressure of 8 cm without EPR.   REVIEW OF SYSTEMS: Out of a complete 14 system review of symptoms, the patient complains  only of the following symptoms, and all other reviewed systems are negative.  ESS:  ALLERGIES: Allergies  Allergen Reactions   Antihistamines, Chlorpheniramine-Type Other (See Comments)    Other Reaction: Dimetapp causes seizures   Divalproex Sodium Other (See Comments)    Nausea, vomiting, liver and kidney dysfunction   Phenytoin Rash and Other (See Comments)    Dilantin causes more seizures.    Valproic Acid And Related Other (See Comments)    Shuts down systems   Vancomycin Rash, Other (See Comments) and Swelling    If given too  fast, causes red man reaction Other Reaction: Redman's Syndrome   Antihistamines, Diphenhydramine-Type Other (See Comments)    seizures    HOME MEDICATIONS: Outpatient Medications Prior to Visit  Medication Sig Dispense Refill   albuterol (ACCUNEB) 1.25 MG/3ML nebulizer solution Take 1 ampule by nebulization every 4 (four) hours as needed for wheezing.     Calcium-Vitamin D-Vitamin K (VIACTIV CALCIUM PLUS D) 650-12.5-40 MG-MCG-MCG CHEW Chew 1 each by mouth daily.     carbamazepine (TEGRETOL) 100 MG chewable tablet CHEW 2 TABLETS IN THE MORNING, 2 TABLETS AT MIDDAY AND 1 & 1/2 TABLETS IN THE EVENING (Patient taking differently: Chew 150 mg by mouth 3 (three) times daily. CHEW 1 1/2 TABLETS IN THE MORNING, 1 1/2 TABLETS AT MIDDAY AND 1 & 1/2 TABLETS IN THE EVENING) 170 tablet 5   cetirizine (ZYRTEC) 10 MG tablet Take 10 mg by mouth daily.     Flaxseed, Linseed, (FLAXSEED OIL) 1000 MG CAPS Take 1,000 mg by mouth daily.     K-PHOS 500 MG tablet TAKE 1 TABLET BY MOUTH FOUR TIMES A DAY 124 tablet 5   levETIRAcetam (KEPPRA) 500 MG tablet Take 1,000-1,250 mg by mouth in the morning and at bedtime. 1,000 mg (2 tabs) in the morning & 1,250 mg (2.5 tabs) at night     levothyroxine (SYNTHROID) 50 MCG tablet Take 50 mcg by mouth daily before breakfast.     LORazepam (ATIVAN) 2 MG/ML injection Inject 1 mL (2 mg total) into the vein See admin instructions. Dilute 2mg  of lorazepam with 1mL of saline.  Administer IV push over 3-5 minutes as needed for seizures. 1 mL 5   montelukast (SINGULAIR) 10 MG tablet Take 10 mg by mouth at bedtime.  6   Multiple Vitamin (MULTIVITAMIN WITH MINERALS) TABS Take 1 tablet by mouth daily.     Omega-3 Fatty Acids (FISH OIL) 500 MG CAPS Take 500 mg by mouth daily.     [name] 10 MG tablet TAKE 1 TABLET BY MOUTH IN THE MORNING, 1 IN THE AFTERNOON AND 2 AT BEDTIME (Patient taking differently: Take 10-20 mg by mouth in the morning, at noon, and at bedtime. 10 mg in the morning, 10 mg  mid-afternoon ( 330p) 20 mg at bedtime) 124 tablet 5   PHENObarbital (LUMINAL) 65 MG/ML injection Withdraw 1ml (65mg ) Phenobarbital and 4ml Normal Saline into 5ml syringe. Adminster IV push for 3-5 minutes as needed for recurrent seizures 5 mL 5   potassium chloride SA (KLOR-CON M20) 20 MEQ tablet TAKE 1 TABLET BY MOUTH EVERY DAY 31 tablet 5   promethazine (PHENERGAN) 25 MG suppository Place 25 mg rectally every 6 (six) hours as needed for nausea or vomiting.     rosuvastatin (CRESTOR) 10 MG tablet Take 10 mg by mouth at bedtime.  5   Topiramate ER (TROKENDI XR) 200 MG CP24 Take 1 capsule by mouth at bedtime. 90 capsule 3   TROKENDI XR 50  MG CP24 Take 1 capsule at nighttime 31 capsule 5   VIMPAT 100 MG TABS TAKE 1 TABLET IN THE MORNING AND TAKE 2 TABLETS AT NIGHT (Patient taking differently: Take 100-200 mg by mouth in the morning and at bedtime. 100 mg in the morning & 200 mg at bedtime) 270 tablet 1   No facility-administered medications prior to visit.    PAST MEDICAL HISTORY: Past Medical History:  Diagnosis Date   Afib (HCC)    only per portocath removal.   ALLERGIC RHINITIS 12/26/2009   Atrial fibrillation (HCC) 04/08/2012   Formatting of this note might be different from the original.  Last Assessment & Plan:   One episode during a procedure in July. Likely induced by the wire. No recurrence. He is in normal sinus rhythm today. No further workup or treatment.   Overview:   Paroxysmal - occurred during a procedure - has been anticoagulated with Coumadin but initiated due to DVT  Formatting of this note might be differ   Blood clot in vein 2015   Brown's syndrome    Complication of anesthesia    seizure after pac surgery at [facility] 2013 had to be intubated, no other anes issurs   Growth disorder    Headache    migraines   Heart murmur    moderate MR ses dr Jamesetta So   Hyperlipidemia    No therapy   Hypokalemia    Mental retardation, mild (I.Q. 50-70)    MITRAL VALVE PROLAPSE  06/22/2007   SEIZURE DISORDER 06/22/2007   last seizure 08-02-2019   Sleep apnea    cpap , last sleep study 10/01/11   Unspecified hypothyroidism 06/22/2007    PAST SURGICAL HISTORY: Past Surgical History:  Procedure Laterality Date   ANKLE SURGERY Left    fused does not bend, wears right leg brace 2018 at [facility]   BIOPSY  08/22/2019   Procedure: BIOPSY;  Surgeon: Napoleon Form, MD;  Location: [facility];  Service: Endoscopy;;   BIOPSY  10/02/2022   Procedure: BIOPSY;  Surgeon: Napoleon Form, MD;  Location: [facility];  Service: Gastroenterology;;   CATARACT EXTRACTION     2021   COLONOSCOPY WITH PROPOFOL N/A 08/22/2019   Procedure: COLONOSCOPY WITH PROPOFOL;  Surgeon: Napoleon Form, MD;  Location: [facility];  Service: Endoscopy;  Laterality: N/A;   COLONOSCOPY WITH PROPOFOL N/A 10/02/2022   Procedure: COLONOSCOPY WITH PROPOFOL;  Surgeon: Napoleon Form, MD;  Location: [facility];  Service: Gastroenterology;  Laterality: N/A;   EYE SURGERY     X2 for Brown Syndrome   HEMOSTASIS CLIP PLACEMENT  08/22/2019   Procedure: HEMOSTASIS CLIP PLACEMENT;  Surgeon: Napoleon Form, MD;  Location: [facility];  Service: Endoscopy;;   IMPLANTATION VAGAL NERVE STIMULATOR     Left leaves on for surgeries, new battery 2022, first put in 2005.   POLYPECTOMY  08/22/2019   Procedure: POLYPECTOMY;  Surgeon: Napoleon Form, MD;  Location: [facility];  Service: Endoscopy;;   PORTACATH PLACEMENT  2013 and removed and new placed   right chest    FAMILY HISTORY: Family History  Problem Relation Age of Onset   Breast cancer Mother    Colon polyps Mother    Clotting disorder Mother    Diabetes Father    Hypertension Father    Colon polyps Father    Hyperlipidemia Father    Atrial fibrillation Father    Diabetes Maternal Grandmother    Heart disease Maternal Grandmother    Diabetes  Maternal Grandfather    Heart disease Maternal Grandfather    Coronary artery  disease Maternal Grandfather    Congestive Heart Failure Maternal Grandfather        Died at 19   Pneumonia Maternal Grandfather    Diabetes Paternal Grandmother    Heart disease Paternal Grandmother    Pneumonia Paternal Grandmother        Died at 40   Stroke Paternal Grandmother    Diabetes Paternal Grandfather    Heart disease Paternal Grandfather    Coronary artery disease Paternal Grandfather    Lung cancer Paternal Grandfather        Died at 36    SOCIAL HISTORY: Social History   Socioeconomic History   Marital status: Single    Spouse name: Not on file   Number of children: 0   Years of education: Not on file   Highest education level: Not on file  Occupational History   Occupation: Disabled    Employer: UNEMPLOYED  Tobacco Use   Smoking status: Never    Passive exposure: Never   Smokeless tobacco: Never  Vaping Use   Vaping Use: Never used  Substance and Sexual Activity   Alcohol use: No    Alcohol/week: 0.0 standard drinks of alcohol   Drug use: No   Sexual activity: Never  Other Topics Concern   Not on file  Social History Narrative   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 Disabled.    He lives with his parents.    He enjoys helping around the house, Nascar, and football.   Caffiene soda 's with caffeien 2-3 / day   Social Determinants of Health   Financial Resource Strain: Not on file  Food Insecurity: No Food Insecurity (06/01/2022)   Hunger Vital Sign    Worried About Running Out of Food in the Last Year: Never true    Ran Out of Food in the Last Year: Never true  Transportation Needs: No Transportation Needs (06/01/2022)   PRAPARE - Administrator, Civil Service (Medical): No    Lack of Transportation (Non-Medical): No  Physical Activity: Not on file  Stress: Not on file  Social Connections: Not on file  Intimate Partner Violence: Patient Declined (06/01/2022)    Humiliation, Afraid, Rape, and Kick questionnaire    Fear of Current or Ex-Partner: Patient declined    Emotionally Abused: Patient declined    Physically Abused: Patient declined    Sexually Abused: Patient declined     PHYSICAL EXAM  There were no vitals filed for this visit. There is no height or weight on file to calculate BMI.  Generalized: Well developed, in no acute distress  Cardiology: normal rate and rhythm, no murmur noted Respiratory: clear to auscultation bilaterally  Neurological examination  Mentation: Alert oriented to time, place, history taking. Follows all commands speech and language fluent Cranial nerve II-XII: Pupils were equal round reactive to light. Extraocular movements were full, visual field were full  Motor: The motor testing reveals 5 over 5 strength of all 4 extremities. Good symmetric motor tone is noted throughout.  Gait and station: Gait is normal.    DIAGNOSTIC DATA (LABS, IMAGING, TESTING) - I reviewed patient records, labs, notes, testing and imaging myself where available.      No data to display           Lab Results  Component Value Date  WBC 3.4 (L) 11/20/2022   HGB 12.8 (L) 11/20/2022   HCT 37.8 (L) 11/20/2022   MCV 84.8 11/20/2022   PLT 233 11/20/2022      Component Value Date/Time   NA 139 11/21/2022 0658   K 3.8 11/21/2022 0658   CL 112 (H) 11/21/2022 0658   CO2 22 11/21/2022 0658   GLUCOSE 102 (H) 11/21/2022 0658   BUN 10 11/21/2022 0658   CREATININE 0.93 11/21/2022 0658   CALCIUM 8.6 (L) 11/21/2022 0658   PROT 6.5 11/20/2022 0642   ALBUMIN 3.3 (L) 11/20/2022 0642   AST 20 11/20/2022 0642   ALT 24 11/20/2022 0642   ALKPHOS 69 11/20/2022 0642   BILITOT 0.5 11/20/2022 0642   GFRNONAA >60 11/21/2022 0658   GFRAA >60 12/06/2019 1158   Lab Results  Component Value Date   TRIG 187 (H) 02/13/2018   No results found for: "HGBA1C" No results found for: "VITAMINB12" Lab Results  Component Value Date   TSH 0.189  (L) 05/13/2014     ASSESSMENT AND PLAN 37 y.o. year old male  has a past medical history of Afib (HCC), ALLERGIC RHINITIS (12/26/2009), Atrial fibrillation (HCC) (04/08/2012), Blood clot in vein (2015), Brown's syndrome, Complication of anesthesia, Growth disorder, Headache, Heart murmur, Hyperlipidemia, Hypokalemia, Mental retardation, mild (I.Q. 50-70), MITRAL VALVE PROLAPSE (06/22/2007), SEIZURE DISORDER (06/22/2007), Sleep apnea, and Unspecified hypothyroidism (06/22/2007). here with   No diagnosis found.    Porter Bustillos Hagy Perez is doing well on CPAP therapy. Compliance report reveals ***. *** was encouraged to continue using CPAP nightly and for greater than 4 hours each night. We will update supply orders as indicated. Risks of untreated sleep apnea review and education materials provided. Healthy lifestyle habits encouraged. *** will follow up in ***, sooner if needed. *** verbalizes understanding and agreement with this plan.    No orders of the defined types were placed in this encounter.    No orders of the defined types were placed in this encounter.     Shawnie Dapper, FNP-C 01/28/2023, 10:29 AM [facility] [address], [address] [place], [place] [address] [phone]

## 2023-01-29 ENCOUNTER — Ambulatory Visit (INDEPENDENT_AMBULATORY_CARE_PROVIDER_SITE_OTHER): Payer: Medicaid Other | Admitting: Family Medicine

## 2023-01-29 ENCOUNTER — Encounter: Payer: Self-pay | Admitting: Family Medicine

## 2023-01-29 VITALS — BP 105/73 | HR 84 | Ht 63.0 in | Wt 127.0 lb

## 2023-01-29 DIAGNOSIS — G40909 Epilepsy, unspecified, not intractable, without status epilepticus: Secondary | ICD-10-CM | POA: Diagnosis not present

## 2023-01-29 DIAGNOSIS — G4733 Obstructive sleep apnea (adult) (pediatric): Secondary | ICD-10-CM | POA: Diagnosis not present

## 2023-02-04 ENCOUNTER — Ambulatory Visit: Payer: Medicaid Other | Admitting: Neurology

## 2023-02-04 ENCOUNTER — Encounter: Payer: Self-pay | Admitting: Neurology

## 2023-02-04 VITALS — BP 105/74 | HR 92 | Ht 63.0 in | Wt 124.5 lb

## 2023-02-04 DIAGNOSIS — G40813 Lennox-Gastaut syndrome, intractable, with status epilepticus: Secondary | ICD-10-CM

## 2023-02-04 DIAGNOSIS — G40911 Epilepsy, unspecified, intractable, with status epilepticus: Secondary | ICD-10-CM | POA: Diagnosis not present

## 2023-02-04 DIAGNOSIS — Z9689 Presence of other specified functional implants: Secondary | ICD-10-CM | POA: Diagnosis not present

## 2023-02-04 DIAGNOSIS — Z5181 Encounter for therapeutic drug level monitoring: Secondary | ICD-10-CM

## 2023-02-04 DIAGNOSIS — F70 Mild intellectual disabilities: Secondary | ICD-10-CM

## 2023-02-04 MED ORDER — EPIDIOLEX 100 MG/ML PO SOLN: 2.5000 mg/kg | Freq: Two times a day (BID) | ORAL | 3 refills | Status: DC

## 2023-02-04 NOTE — Progress Notes (Signed)
[facility]  PATIENT: [name] DOB: [birth year]  REQUESTING CLINICIAN: Elveria Rising, NP HISTORY FROM: Mother, sister and chart review  REASON FOR VISIT: Transitioning from peds to adult. Epilepsy management.    HISTORICAL  CHIEF COMPLAINT:  Chief Complaint  Patient presents with   New Patient (Initial Visit)    Rm 13, mother (doris) & sister (joy) present Sz: last one was 01/30/23 lasted about a minute    HISTORY OF PRESENT ILLNESS:  This is a 37 year old gentleman with multiple medical conditions including intellectual disability, intractable epilepsy status post VNS placement, hypothyroidism, hormone deficiency, obstructive sleep apnea on CPAP who is presenting to establish care.  Patient was initially previously managed by pediatric neurology and Dr. Zara Council at [facility] but now he needs a local adult neurologist.   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  6 months later he started having his first seizure.  Since then his seizures have been intractable.  He had multiple type of  seizures including tonic seizures, bilateral tonic-clonic seizures, focal sensory seizures where he is aware, atonic seizure where he will fall and staring seizures.  He has tried multiple medication listed below.   Currently he is on the combination on 5 antiseizure medications including Tegretol 150 mg 3 times daily, clobazam 10/10 and 20 mg at bedtime, Vimpat 100 mg in the morning 200 at night, Keppra 1000 mg in the morning and 1250 in the evening and Trokendi 250 at bedtime.  Family reports that his seizures remain intractable despite VNS therapy.  He continued to have 2-3 seizures a month.  He was admitted last in the hospital in February for seizures and his last seizure was on May 13.  Family has reported prolonged seizure where he will need CPR, he also had status where he was intubated but  currently they feel like the seizures are stable not getting worse even though he is getting 2 to 3/month.  Denies any history of birth trauma and he did not stay in the ICU as a baby. They could not related any seizure risk factors.    Handedness: Right   Onset: At the age of 7  Seizure Type: Tonic seizures, bilateral tonic clonic seizures, atonic seizures (drop seizure), focal sensory seizures and staring seizures  Current frequency: Last seizures 5/13, 2 to 3 seizures a months   Any injuries from seizures: Fall, concussion   Seizure risk factors: None reported   Previous ASMs: Gabapentin, Tegretol, Lamotrigine, Felbatol, Depakote, Topamax, Keppra, [name], Phenobarbital, Lacosamide, Phenytoin  Currenty ASMs: Tegretol 150 mg TID, Clobazam 07/02/19 mg, Vimpat 100/200 mg, Keppra 1000/1250 mg, Trokendi 250 mg qHs   ASMs side effects: Allergic reaction to Phenytoin and Dilantin   Brain Images: No intracranial pathology. Last CT with subdural collection   Previous EEGs: Diffuse slow, focal left slowing   OTHER MEDICAL CONDITIONS: Intellectual disability, Intractable seizures s/p VNS placement, OSA on CPAP, Hypothyroidism   REVIEW OF SYSTEMS: Full 14 system review of systems performed and negative with exception of: As noted in the HPI   ALLERGIES: Allergies  Allergen Reactions   Antihistamines, Chlorpheniramine-Type Other (See Comments)    Other Reaction: Dimetapp causes seizures  Allergen- Dimetapp, Reaction - seizures   Divalproex Sodium Other (See Comments)    Nausea, vomiting, liver and kidney dysfunction   Phenytoin Rash and Other (See Comments)    Dilantin causes more seizures.  Valproic Acid And Related Other (See Comments)    Shuts down systems   Vancomycin Other (See Comments), Rash and Swelling    If given too fast, causes red man reaction  Other Reaction: Redman's Syndrome  Other Reaction(s): Angioedema   Antihistamines, Diphenhydramine-Type Other (See  Comments)    seizures   Phenytoin Sodium Extended Swelling    Enhances seizures  Other Reaction(s): Angioedema   Valproic Acid Other (See Comments)    Shuts down systems  Other Reaction(s): Angioedema  Starts to shut down body functions   Divalproex Sodium Rash    Nausea, vomiting, liver and kidney dysfunction    HOME MEDICATIONS: Outpatient Medications Prior to Visit  Medication Sig Dispense Refill   albuterol (ACCUNEB) 1.25 MG/3ML nebulizer solution Take 1 ampule by nebulization every 4 (four) hours as needed for wheezing.     Calcium-Vitamin D-Vitamin K (VIACTIV CALCIUM PLUS D) 650-12.5-40 MG-MCG-MCG CHEW Chew 1 each by mouth daily.     carbamazepine (TEGRETOL) 100 MG chewable tablet CHEW 2 TABLETS IN THE MORNING, 2 TABLETS AT MIDDAY AND 1 & 1/2 TABLETS IN THE EVENING (Patient taking differently: Chew 150 mg by mouth 3 (three) times daily. CHEW 1 1/2 TABLETS IN THE MORNING, 1 1/2 TABLETS AT MIDDAY AND 1 & 1/2 TABLETS IN THE EVENING) 170 tablet 5   cetirizine (ZYRTEC) 10 MG tablet Take 10 mg by mouth daily.     Flaxseed, Linseed, (FLAXSEED OIL) 1000 MG CAPS Take 1,000 mg by mouth daily.     K-PHOS 500 MG tablet TAKE 1 TABLET BY MOUTH FOUR TIMES A DAY 124 tablet 5   levETIRAcetam (KEPPRA) 500 MG tablet Take 1,000-1,250 mg by mouth in the morning and at bedtime. 1,000 mg (2 tabs) in the morning & 1,250 mg (2.5 tabs) at night     levothyroxine (SYNTHROID) 50 MCG tablet Take 50 mcg by mouth daily before breakfast.     LORazepam (ATIVAN) 2 MG/ML injection Inject 1 mL (2 mg total) into the vein See admin instructions. Dilute 2mg  of lorazepam with 1mL of saline.  Administer IV push over 3-5 minutes as needed for seizures. 1 mL 5   montelukast (SINGULAIR) 10 MG tablet Take 10 mg by mouth at bedtime.  6   Multiple Vitamin (MULTIVITAMIN WITH MINERALS) TABS Take 1 tablet by mouth daily.     Omega-3 Fatty Acids (FISH OIL) 500 MG CAPS Take 500 mg by mouth daily.     [name] 10 MG tablet TAKE 1  TABLET BY MOUTH IN THE MORNING, 1 IN THE AFTERNOON AND 2 AT BEDTIME (Patient taking differently: Take 10-20 mg by mouth in the morning, at noon, and at bedtime. 10 mg in the morning, 10 mg mid-afternoon ( 330p) 20 mg at bedtime) 124 tablet 5   PHENObarbital (LUMINAL) 65 MG/ML injection Withdraw 1ml (65mg ) Phenobarbital and 4ml Normal Saline into 5ml syringe. Adminster IV push for 3-5 minutes as needed for recurrent seizures 5 mL 5   potassium chloride SA (KLOR-CON M20) 20 MEQ tablet TAKE 1 TABLET BY MOUTH EVERY DAY 31 tablet 5   promethazine (PHENERGAN) 25 MG suppository Place 25 mg rectally every 6 (six) hours as needed for nausea or vomiting.     rosuvastatin (CRESTOR) 10 MG tablet Take 10 mg by mouth at bedtime.  5   Topiramate ER (TROKENDI XR) 200 MG CP24 Take 1 capsule by mouth at bedtime. 90 capsule 3   TROKENDI XR 50 MG CP24 Take 1 capsule at nighttime 31 capsule 5  VIMPAT 100 MG TABS TAKE 1 TABLET IN THE MORNING AND TAKE 2 TABLETS AT NIGHT (Patient taking differently: Take 100-200 mg by mouth in the morning and at bedtime. 100 mg in the morning & 200 mg at bedtime) 270 tablet 1   No facility-administered medications prior to visit.    PAST MEDICAL HISTORY: Past Medical History:  Diagnosis Date   Afib (HCC)    only per portocath removal.   ALLERGIC RHINITIS 12/26/2009   Atrial fibrillation (HCC) 04/08/2012   Formatting of this note might be different from the original.  Last Assessment & Plan:   One episode during a procedure in July. Likely induced by the wire. No recurrence. He is in normal sinus rhythm today. No further workup or treatment.   Overview:   Paroxysmal - occurred during a procedure - has been anticoagulated with Coumadin but initiated due to DVT  Formatting of this note might be differ   Blood clot in vein 2015   Brown's syndrome    Complication of anesthesia    seizure after pac surgery at [facility] 2013 had to be intubated, no other anes issurs   Growth disorder     Headache    migraines   Heart murmur    moderate MR ses dr Jamesetta So   Hyperlipidemia    No therapy   Hypokalemia    Mental retardation, mild (I.Q. 50-70)    MITRAL VALVE PROLAPSE 06/22/2007   SEIZURE DISORDER 06/22/2007   last seizure 08-02-2019   Sleep apnea    cpap , last sleep study 10/01/11   Unspecified hypothyroidism 06/22/2007    PAST SURGICAL HISTORY: Past Surgical History:  Procedure Laterality Date   ANKLE SURGERY Left    fused does not bend, wears right leg brace 2018 at [facility]   BIOPSY  08/22/2019   Procedure: BIOPSY;  Surgeon: Napoleon Form, MD;  Location: [facility];  Service: Endoscopy;;   BIOPSY  10/02/2022   Procedure: BIOPSY;  Surgeon: Napoleon Form, MD;  Location: [facility];  Service: Gastroenterology;;   CATARACT EXTRACTION     2021   COLONOSCOPY WITH PROPOFOL N/A 08/22/2019   Procedure: COLONOSCOPY WITH PROPOFOL;  Surgeon: Napoleon Form, MD;  Location: [facility];  Service: Endoscopy;  Laterality: N/A;   COLONOSCOPY WITH PROPOFOL N/A 10/02/2022   Procedure: COLONOSCOPY WITH PROPOFOL;  Surgeon: Napoleon Form, MD;  Location: [facility];  Service: Gastroenterology;  Laterality: N/A;   EYE SURGERY     X2 for Brown Syndrome   HEMOSTASIS CLIP PLACEMENT  08/22/2019   Procedure: HEMOSTASIS CLIP PLACEMENT;  Surgeon: Napoleon Form, MD;  Location: [facility];  Service: Endoscopy;;   IMPLANTATION VAGAL NERVE STIMULATOR     Left leaves on for surgeries, new battery 2022, first put in 2005.   POLYPECTOMY  08/22/2019   Procedure: POLYPECTOMY;  Surgeon: Napoleon Form, MD;  Location: [facility];  Service: Endoscopy;;   PORTACATH PLACEMENT  2013 and removed and new placed   right chest    FAMILY HISTORY: Family History  Problem Relation Age of Onset   Breast cancer Mother    Colon polyps Mother    Clotting disorder Mother    Diabetes Father    Hypertension Father    Colon polyps Father    Hyperlipidemia Father     Atrial fibrillation Father    Diabetes Maternal Grandmother    Heart disease Maternal Grandmother    Diabetes Maternal Grandfather    Heart disease Maternal Grandfather  Coronary artery disease Maternal Grandfather    Congestive Heart Failure Maternal Grandfather        Died at 80   Pneumonia Maternal Grandfather    Diabetes Paternal Grandmother    Heart disease Paternal Grandmother    Pneumonia Paternal Grandmother        Died at 65   Stroke Paternal Grandmother    Diabetes Paternal Grandfather    Heart disease Paternal Grandfather    Coronary artery disease Paternal Grandfather    Lung cancer Paternal Grandfather        Died at 52    SOCIAL HISTORY: Social History   Socioeconomic History   Marital status: Single    Spouse name: Not on file   Number of children: 0   Years of education: Not on file   Highest education level: Not on file  Occupational History   Occupation: Disabled    Employer: UNEMPLOYED  Tobacco Use   Smoking status: Never    Passive exposure: Never   Smokeless tobacco: Never  Vaping Use   Vaping Use: Never used  Substance and Sexual Activity   Alcohol use: No    Alcohol/week: 0.0 standard drinks of alcohol   Drug use: No   Sexual activity: Never  Other Topics Concern   Not on file  Social History Narrative   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 Disabled.    He lives with his parents.    He enjoys helping around the house, Nascar, and football.   Caffiene soda 's with caffeien 2-3 / day   Social Determinants of Health   Financial Resource Strain: Not on file  Food Insecurity: No Food Insecurity (06/01/2022)   Hunger Vital Sign    Worried About Running Out of Food in the Last Year: Never true    Ran Out of Food in the Last Year: Never true  Transportation Needs: No Transportation Needs (06/01/2022)   PRAPARE - Administrator, Civil Service (Medical): No    Lack of  Transportation (Non-Medical): No  Physical Activity: Not on file  Stress: Not on file  Social Connections: Not on file  Intimate Partner Violence: Patient Declined (06/01/2022)   Humiliation, Afraid, Rape, and Kick questionnaire    Fear of Current or Ex-Partner: Patient declined    Emotionally Abused: Patient declined    Physically Abused: Patient declined    Sexually Abused: Patient declined    PHYSICAL EXAM  GENERAL EXAM/CONSTITUTIONAL: Vitals:  Vitals:   02/04/23 0820  BP: 105/74  Pulse: 92  Weight: 124 lb 8 oz (56.5 kg)  Height: 5\' 3"  (1.6 m)   Body mass index is 22.05 kg/m. Wt Readings from Last 3 Encounters:  02/05/23 126 lb (57.2 kg)  02/04/23 124 lb 8 oz (56.5 kg)  01/29/23 127 lb (57.6 kg)   Patient is in no distress; well developed, nourished and groomed; neck is supple  MUSCULOSKELETAL: Gait, strength, tone, movements noted in Neurologic exam below  NEUROLOGIC: MENTAL STATUS:      No data to display         awake, alert, oriented to person Answering appropriately Able to follow commands and direction  Able to track examiner  Normal bulk and tone, he does have contraction  Sensory normal to light touch Clumsy gait and poor balance, he does need assistance with ambulation     DIAGNOSTIC DATA (LABS, IMAGING, TESTING) - I reviewed patient  records, labs, notes, testing and imaging myself where available.  Lab Results  Component Value Date   WBC 3.4 (L) 11/20/2022   HGB 12.8 (L) 11/20/2022   HCT 37.8 (L) 11/20/2022   MCV 84.8 11/20/2022   PLT 233 11/20/2022      Component Value Date/Time   NA 144 02/04/2023 0929   K 4.4 02/04/2023 0929   CL 109 (H) 02/04/2023 0929   CO2 22 02/04/2023 0929   GLUCOSE 96 02/04/2023 0929   GLUCOSE 102 (H) 11/21/2022 0658   BUN 15 02/04/2023 0929   CREATININE 0.92 02/04/2023 0929   CALCIUM 9.4 02/04/2023 0929   PROT 7.4 02/04/2023 0929   ALBUMIN 4.4 02/04/2023 0929   AST 19 02/04/2023 0929   ALT 26  02/04/2023 0929   ALKPHOS 97 02/04/2023 0929   BILITOT <0.2 02/04/2023 0929   GFRNONAA >60 11/21/2022 0658   GFRAA >60 12/06/2019 1158   Lab Results  Component Value Date   TRIG 187 (H) 02/13/2018   No results found for: "HGBA1C" No results found for: "VITAMINB12" Lab Results  Component Value Date   TSH 0.189 (L) 05/13/2014    CT Head 11/19/2022 1. 4 mm thick hypodense subdural collection over the left cerebral convexity without mass effect or midline shift is likely subacute to chronic. No evidence of acute blood products. 2. Otherwise, no evidence of acute intracranial pathology. 3. Unchanged dense basal ganglia calcifications   EEG 11/20/22: - Intermittent slow, generalized and maximal left temporal     ASSESSMENT AND PLAN  37 y.o. year old male  with past medical history of intractable epilepsy status post VNS placement, intellectual disability, obstructive sleep apnea on CPAP, hypothyroidism who is presenting to establish care.  Patient was previously managed by pediatric neurologist but he needs to transition to adult.  Family described multiple seizure type including bilateral tonic-clonic seizures, tonic seizures, atonic seizures, sensory seizures and staring seizures. For his intractable seizures on 5 antiseizure medication including carbamazepine, clobazam, lacosamide, levetiracetam, Trokendi, he still continue to have 1-2 seizures per month.  His most recent EEG in February showed generalized slowing.  Patient probably has Lennox-Gastaut syndrome based on multiple seizure types, intellectual disability, his seizure starting at the age of 37 years old and a diffuse slowing seen on his most recent EEG.  Plan for today is to check patient left antiseizure medication level, adjust his VNS settings, and I will add Epidiolex 1.4 ml twice daily.  I will see the patient for follow-up in 2 months.  Advised family to contact me if there are any side effect from the medication, prolonged  seizures or any other concerns.   1. Intractable epilepsy with status epilepticus, unspecified epilepsy type (HCC)   2. 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 Mild intellectual disability   4. S/P placement of VNS (vagus nerve stimulation) device   5. Therapeutic drug monitoring     Patient Instructions  Continue current antiseizure medications including: Carbamazepine 150 mg 3 times daily Clobazam 10 mg in the morning 10 mg in the afternoon and 20 mg at bedtime Lacosamide 100 mg in the morning and 200 mg at night Levetiracetam 1000 mg in the morning and 1250 mg at night Trokendi 250 mg at night Will add Epidiolex 1.4 mL twice a day. Will check antiseizure medications level today VNS settings changed today, patient tolerated procedure well Continue with the combination of Ativan and phenobarbital as rescue medication Follow up in 2 months or sooner if  worse    Per [facility] statutes, patients with seizures are not allowed to drive until they have been seizure-free for six months.  Other recommendations include using caution when using heavy equipment or power tools. Avoid working on ladders or at heights. Take showers instead of baths.  Do not swim alone.  Ensure the water temperature is not too high on the home water heater. Do not go swimming alone. Do not lock yourself in a room alone (i.e. bathroom). When caring for infants or small children, sit down when holding, feeding, or changing them to minimize risk of injury to the child in the event you have a seizure. Maintain good sleep hygiene. Avoid alcohol.  Also recommend adequate sleep, hydration, good diet and minimize stress.   During the Seizure  - First, ensure adequate ventilation and place patients on the floor on their left side  Loosen clothing around the neck and ensure the airway is patent. If the patient is clenching the teeth, do not force the mouth open with any object as this  can cause severe damage - Remove all items from the surrounding that can be hazardous. The patient may be oblivious to what's happening and may not even know what he or she is doing. If the patient is confused and wandering, either gently guide him/her away and block access to outside areas - Reassure the individual and be comforting - Call 911. In most cases, the seizure ends before EMS arrives. However, there are cases when seizures may last over 3 to 5 minutes. Or the individual may have developed breathing difficulties or severe injuries. If a pregnant patient or a person with diabetes develops a seizure, it is prudent to call an ambulance. - Finally, if the patient does not regain full consciousness, then call EMS. Most patients will remain confused for about 45 to 90 minutes after a seizure, so you must use judgment in calling for help. - Avoid restraints but make sure the patient is in a bed with padded side rails - Place the individual in a lateral position with the neck slightly flexed; this will help the saliva drain from the mouth and prevent the tongue from falling backward - Remove all nearby furniture and other hazards from the area - Provide verbal assurance as the individual is regaining consciousness - Provide the patient with privacy if possible - Call for help and start treatment as ordered by the caregiver   After the Seizure (Postictal Stage)  After a seizure, most patients experience confusion, fatigue, muscle pain and/or a headache. Thus, one should permit the individual to sleep. For the next few days, reassurance is essential. Being calm and helping reorient the person is also of importance.  Most seizures are painless and end spontaneously. Seizures are not harmful to others but can lead to complications such as stress on the lungs, brain and the heart. Individuals with prior lung problems may develop labored breathing and respiratory distress.     Orders Placed This  Encounter  Procedures   Carbamazepine level, total   Levetiracetam level   Topiramate Level   Lacosamide   CLOBAZAM   CMP   Vitamin D, 25-hydroxy   Vagal Nerve Stimulation    Meds ordered this encounter  Medications   cannabidiol (EPIDIOLEX) 100 MG/ML solution    Sig: Take 1.4 mLs (140 mg total) by mouth 2 (two) times daily.    Dispense:  300 mL    Refill:  3    Return  in about 2 months (around 04/21/2023).    Windell Norfolk, MD 02/08/2023, 9:40 PM  [facility] [address], [address] [place], [place] [address] [phone]

## 2023-02-05 ENCOUNTER — Ambulatory Visit (INDEPENDENT_AMBULATORY_CARE_PROVIDER_SITE_OTHER): Payer: Medicaid Other | Admitting: Family

## 2023-02-05 ENCOUNTER — Encounter (INDEPENDENT_AMBULATORY_CARE_PROVIDER_SITE_OTHER): Payer: Self-pay | Admitting: Family

## 2023-02-05 VITALS — BP 110/60 | HR 82 | Ht 61.61 in | Wt 126.0 lb

## 2023-02-05 DIAGNOSIS — G40319 Generalized idiopathic epilepsy and epileptic syndromes, intractable, without status epilepticus: Secondary | ICD-10-CM

## 2023-02-05 DIAGNOSIS — G40119 Localization-related (focal) (partial) symptomatic epilepsy and epileptic syndromes with simple partial seizures, intractable, without status epilepticus: Secondary | ICD-10-CM | POA: Diagnosis not present

## 2023-02-05 DIAGNOSIS — R2689 Other abnormalities of gait and mobility: Secondary | ICD-10-CM | POA: Diagnosis not present

## 2023-02-05 DIAGNOSIS — G4733 Obstructive sleep apnea (adult) (pediatric): Secondary | ICD-10-CM

## 2023-02-05 DIAGNOSIS — F70 Mild intellectual disabilities: Secondary | ICD-10-CM

## 2023-02-05 DIAGNOSIS — M216X9 Other acquired deformities of unspecified foot: Secondary | ICD-10-CM

## 2023-02-05 DIAGNOSIS — G43009 Migraine without aura, not intractable, without status migrainosus: Secondary | ICD-10-CM

## 2023-02-05 DIAGNOSIS — E876 Hypokalemia: Secondary | ICD-10-CM | POA: Diagnosis not present

## 2023-02-05 NOTE — Patient Instructions (Signed)
It was a pleasure to see you today!  Instructions for you until your next appointment are as follows: Continue your medications as prescribed Continue close follow up with Dr Teresa Coombs and your other specialists Please sign up for MyChart if you have not done so. Please let me know if you have any questions or concerns  Feel free to contact our office during normal business hours at [phone] with questions or concerns. If there is no answer or the call is outside business hours, please leave a message and our clinic staff will call you back within the next business day.  If you have an urgent concern, please stay on the line for our after-hours answering service and ask for the on-call neurologist.     I also encourage you to use MyChart to communicate with me more directly. If you have not yet signed up for MyChart within [facility], the front desk staff can help you. However, please note that this inbox is NOT monitored on nights or weekends, and response can take up to 2 business days.  Urgent matters should be discussed with the on-call pediatric neurologist.   At [facility], we are committed to providing exceptional care. You will receive a patient satisfaction survey through text or email regarding your visit today. Your opinion is important to me. Comments are appreciated.

## 2023-02-05 NOTE — Progress Notes (Signed)
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    Today's concerns: Saw Dr Teresa Coombs at [facility] yesterday for transfer of care to adult neurology provider. Plan made to start Epidiolex Is also followed at [facility] for OSA Mom reports that [name] will no longer see Dr Zara Council at [facility] because of changes in the [employer] plan.  No equipment or DME needs at this time. Menzo has been otherwise generally healthy since he was last seen. No health concerns today other than previously mentioned.  Review of systems: Please see HPI for neurologic and other pertinent review of systems. Otherwise all other systems were reviewed and were negative.  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/07/2020   Adenomatous polyp of transverse colon 09/17/2019   Polyp of sigmoid colon    Migraine without aura and without status migrainosus, not intractable 02/22/2018   Multifactorial gait  disorder 08/21/2014   Hypokalemia 08/03/2013   Hypothyroidism 08/02/2013   Contracture of ankle and foot joint 03/29/2013   Flexion contracture of ankle or foot joint 03/29/2013   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   ALLERGIC RHINITIS 12/26/2009   Atrial fibrillation (HCC) 04/08/2012   Formatting of this note might be different from the original.  Last Assessment & Plan:   One episode during a procedure in July. Likely induced by the wire. No recurrence. He is in normal sinus rhythm today. No further workup or treatment.   Overview:   Paroxysmal - occurred during a procedure - has been anticoagulated with Coumadin but initiated due to DVT  Formatting of this note might be differ   Blood clot in vein 2015   Brown's syndrome    Complication of anesthesia    seizure after pac surgery at [facility] 2013 had to be intubated, no other anes issurs   Growth disorder    Headache    migraines   Heart murmur    moderate MR ses dr Jamesetta So   Hyperlipidemia  No therapy   Hypokalemia    Mental retardation, mild (I.Q. 50-70)    MITRAL VALVE PROLAPSE 06/22/2007   SEIZURE DISORDER 06/22/2007   last seizure 08-02-2019   Sleep apnea    cpap , last sleep study 10/01/11   Unspecified hypothyroidism 06/22/2007    Past medical history comments: See HPI  Surgical history: Past Surgical  History:  Procedure Laterality Date   ANKLE SURGERY Left    fused does not bend, wears right leg brace 2018 at [facility]   BIOPSY  08/22/2019   Procedure: BIOPSY;  Surgeon: Napoleon Form, MD;  Location: [facility];  Service: Endoscopy;;   BIOPSY  10/02/2022   Procedure: BIOPSY;  Surgeon: Napoleon Form, MD;  Location: [facility];  Service: Gastroenterology;;   CATARACT EXTRACTION     2021   COLONOSCOPY WITH PROPOFOL N/A 08/22/2019   Procedure: COLONOSCOPY WITH PROPOFOL;  Surgeon: Napoleon Form, MD;  Location: [facility];  Service: Endoscopy;  Laterality: N/A;   COLONOSCOPY WITH PROPOFOL N/A 10/02/2022   Procedure: COLONOSCOPY WITH PROPOFOL;  Surgeon: Napoleon Form, MD;  Location: [facility];  Service: Gastroenterology;  Laterality: N/A;   EYE SURGERY     X2 for Brown Syndrome   HEMOSTASIS CLIP PLACEMENT  08/22/2019   Procedure: HEMOSTASIS CLIP PLACEMENT;  Surgeon: Napoleon Form, MD;  Location: [facility];  Service: Endoscopy;;   IMPLANTATION VAGAL NERVE STIMULATOR     Left leaves on for surgeries, new battery 2022, first put in 2005.   POLYPECTOMY  08/22/2019   Procedure: POLYPECTOMY;  Surgeon: Napoleon Form, MD;  Location: [facility];  Service: Endoscopy;;   PORTACATH PLACEMENT  2013 and removed and new placed   right chest     Family history: family history includes Atrial fibrillation in his father; Breast cancer in his mother; Clotting disorder in his mother; Colon polyps in his father and mother; Congestive Heart Failure in his maternal grandfather; Coronary artery disease in his maternal grandfather and paternal grandfather; Diabetes in his father, maternal grandfather, maternal grandmother, paternal grandfather, and paternal grandmother; Heart disease in his maternal grandfather, maternal grandmother, paternal grandfather, and paternal grandmother; Hyperlipidemia in his father; Hypertension in his father; Lung cancer in his paternal  grandfather; Pneumonia in his maternal grandfather and paternal grandmother; Stroke in his paternal grandmother.   Social history: Social History   Socioeconomic History   Marital status: Single    Spouse name: Not on file   Number of children: 0   Years of education: Not on file   Highest education level: Not on file  Occupational History   Occupation: Disabled    Employer: UNEMPLOYED  Tobacco Use   Smoking status: Never    Passive exposure: Never   Smokeless tobacco: Never  Vaping Use   Vaping Use: Never used  Substance and Sexual Activity   Alcohol use: No    Alcohol/week: 0.0 standard drinks of alcohol   Drug use: No   Sexual activity: Never  Other Topics Concern   Not on file  Social History Narrative   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 Disabled.    He lives with his parents.    He enjoys helping around the house, Nascar, and football.   Caffiene soda 's with caffeien 2-3 / day   Social Determinants of Health   Financial Resource Strain: Not on file  Food Insecurity: No Food Insecurity (06/01/2022)  Hunger Vital Sign    Worried About Running Out of Food in the Last Year: Never true    Ran Out of Food in the Last Year: Never true  Transportation Needs: No Transportation Needs (06/01/2022)   PRAPARE - Administrator, Civil Service (Medical): No    Lack of Transportation (Non-Medical): No  Physical Activity: Not on file  Stress: Not on file  Social Connections: Not on file  Intimate Partner Violence: Patient Declined (06/01/2022)   Humiliation, Afraid, Rape, and Kick questionnaire    Fear of Current or Ex-Partner: Patient declined    Emotionally Abused: Patient declined    Physically Abused: Patient declined    Sexually Abused: Patient declined    Past/failed meds: Copied from previous record: Tried Gabapentin, Tegretol, Lamictal, Felbatol, Depakote, Topamax, Keppra, [name],  Phenobarbital, Vimpat, Diastat, Dilantin Allergic to Dilantin and Depakote  Allergies: Allergies  Allergen Reactions   Antihistamines, Chlorpheniramine-Type Other (See Comments)    Other Reaction: Dimetapp causes seizures  Allergen- Dimetapp, Reaction - seizures   Divalproex Sodium Other (See Comments)    Nausea, vomiting, liver and kidney dysfunction   Phenytoin Rash and Other (See Comments)    Dilantin causes more seizures.    Valproic Acid And Related Other (See Comments)    Shuts down systems   Vancomycin Other (See Comments), Rash and Swelling    If given too fast, causes red man reaction  Other Reaction: Redman's Syndrome  Other Reaction(s): Angioedema   Antihistamines, Diphenhydramine-Type Other (See Comments)    seizures   Phenytoin Sodium Extended Swelling    Enhances seizures  Other Reaction(s): Angioedema   Valproic Acid Other (See Comments)    Shuts down systems  Other Reaction(s): Angioedema  Starts to shut down body functions   Divalproex Sodium Rash    Nausea, vomiting, liver and kidney dysfunction    Immunizations: Immunization History  Administered Date(s) Administered   Influenza Whole 07/11/2008, 08/06/2010   Influenza,inj,Quad PF,6+ Mos 08/04/2013, 06/14/2016   Pneumococcal Polysaccharide-23 11/05/2015   Td 12/07/2008    Diagnostics/Screenings: Copied from previous record: 11/20/2022 - Overnight EEG with video - This study is suggestive of non-specific cortical dysfunction arising from left temporal region. Additionally there is mild diffuse encephalopathy, nonspecific etiology. No seizures or epileptiform discharges were seen throughout the recording. Please note lack of epileptiform activity during interictal EEG does not exclude the diagnosis of epilepsy. Priyanka Annabelle Harman    Nocturnal polysomnogram at [facility] 10/01/2011 - abnormal   Physical Exam: BP 110/60 (BP Location: Left Arm, Patient Position: Sitting, Cuff Size: Small)    Pulse 82   Ht 5' 1.61" (1.565 m)   Wt 126 lb (57.2 kg)   BMI 23.34 kg/m   General: well developed, well nourished man, seated in exam room, in no evident distress Head: normocephalic and atraumatic. Oropharynx benign. No dysmorphic features. Neck: supple but large for his body size Cardiovascular: regular rate and rhythm, no murmurs. Respiratory: clear to auscultation bilaterally Abdomen: bowel sounds present all four quadrants, abdomen soft, non-tender, non-distended.  Musculoskeletal: no skeletal deformities or obvious scoliosis. Has increased tone in his hands and right lower extremity. Wears right AFO Skin: no rashes or neurocutaneous lesions  Neurologic Exam Mental Status: awake and fully alert. Has limited language.  Able to follow simple directions and participate in examination Cranial Nerves: fundoscopic exam - red reflex present.  Unable to fully visualize fundus.  Pupils equal briskly reactive to light.  Turns to localize faces and objects  in the periphery. Turns to localize sounds in the periphery. Facial movements are symmetric Motor: normal functional bulk, tone and strength except for hands and right lower extremity Sensory: withdrawal x 4 Coordination: unable to adequately assess due to patient's inability to participate in examination. No dysmetria when reaching for objects. 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 No recent diagnostic studies to be reviewed with the patient.  Plan until next visit: Continue medications as prescribed  Continue follow up  with Dr Teresa Coombs as scheduled Call for questions or concerns No follow up planned at this time.  The medication list was reviewed and reconciled. No changes were made in the prescribed medications today. A complete medication list was provided to the patient.  Allergies as of 02/05/2023       Reactions   Antihistamines, Chlorpheniramine-type Other (See Comments)   Other Reaction: Dimetapp causes seizures Allergen- Dimetapp, Reaction - seizures   Divalproex Sodium Other (See Comments)   Nausea, vomiting, liver and kidney dysfunction   Phenytoin Rash, Other (See Comments)   Dilantin causes more seizures.   Valproic Acid And Related Other (See Comments)   Shuts down systems   Vancomycin Other (See Comments), Rash, Swelling   If given too fast, causes red man reaction Other Reaction: Redman's Syndrome Other Reaction(s): Angioedema   Antihistamines, Diphenhydramine-type Other (See Comments)   seizures   Phenytoin Sodium Extended Swelling   Enhances seizures Other Reaction(s): Angioedema   Valproic Acid Other (See Comments)   Shuts down systems Other Reaction(s): Angioedema Starts to shut down body functions   Divalproex Sodium Rash   Nausea, vomiting, liver and kidney dysfunction        Medication List        Accurate as of Feb 05, 2023  5:28 PM. If you have any questions, ask your nurse or doctor.          albuterol 1.25 MG/3ML nebulizer solution Commonly known as: ACCUNEB Take 1 ampule by nebulization every 4 (four) hours as needed for wheezing.   carbamazepine 100 MG chewable tablet Commonly known as: TEGRETOL CHEW 2 TABLETS IN THE MORNING, 2 TABLETS AT MIDDAY AND 1 & 1/2 TABLETS IN THE EVENING What changed:  how much to take how to take this when to take this additional instructions   cetirizine 10 MG tablet Commonly known as: ZYRTEC Take 10 mg by mouth daily.   Epidiolex 100 MG/ML solution Generic drug: cannabidiol Take 1.4 mLs (140 mg total) by mouth 2  (two) times daily.   Fish Oil 500 MG Caps Take 500 mg by mouth daily.   Flaxseed Oil 1000 MG Caps Take 1,000 mg by mouth daily.   K-Phos 500 MG tablet Generic drug: potassium phosphate (monobasic) TAKE 1 TABLET BY MOUTH FOUR TIMES A DAY   Klor-Con M20 20 MEQ tablet Generic drug: potassium chloride SA TAKE 1 TABLET BY MOUTH EVERY DAY   levETIRAcetam 500 MG tablet Commonly known as: KEPPRA Take 1,000-1,250 mg by mouth in the morning and at bedtime. 1,000 mg (2 tabs) in the morning & 1,250 mg (2.5 tabs) at night   levothyroxine 50 MCG tablet Commonly known as:  SYNTHROID Take 50 mcg by mouth daily before breakfast.   LORazepam 2 MG/ML injection Commonly known as: ATIVAN Inject 1 mL (2 mg total) into the vein See admin instructions. Dilute 2mg  of lorazepam with 1mL of saline.  Administer IV push over 3-5 minutes as needed for seizures.   montelukast 10 MG tablet Commonly known as: SINGULAIR Take 10 mg by mouth at bedtime.   multivitamin with minerals Tabs tablet Take 1 tablet by mouth daily.   [name] 10 MG tablet Generic drug: cloBAZam TAKE 1 TABLET BY MOUTH IN THE MORNING, 1 IN THE AFTERNOON AND 2 AT BEDTIME What changed:  how much to take how to take this when to take this additional instructions   PHENObarbital 65 MG/ML injection Commonly known as: LUMINAL Withdraw 1ml (65mg ) Phenobarbital and 4ml Normal Saline into 5ml syringe. Adminster IV push for 3-5 minutes as needed for recurrent seizures   promethazine 25 MG suppository Commonly known as: PHENERGAN Place 25 mg rectally every 6 (six) hours as needed for nausea or vomiting.   rosuvastatin 10 MG tablet Commonly known as: CRESTOR Take 10 mg by mouth at bedtime.   Trokendi XR 50 MG Cp24 Generic drug: Topiramate ER Take 1 capsule at nighttime   Trokendi XR 200 MG Cp24 Generic drug: Topiramate ER Take 1 capsule by mouth at bedtime.   Viactiv Calcium Plus D 650-12.5-40 MG-MCG-MCG Chew Generic drug:  Calcium-Vitamin D-Vitamin K Chew 1 each by mouth daily.   Vimpat 100 MG Tabs Generic drug: Lacosamide TAKE 1 TABLET IN THE MORNING AND TAKE 2 TABLETS AT NIGHT What changed:  how much to take how to take this when to take this additional instructions      Total time spent with the patient was 20 minutes, of which 50% or more was spent in counseling and coordination of care.  Elveria Rising NP-C [facility] [address], [address] [place], [place] [address] Ph. [phone] Fax [phone]

## 2023-02-07 ENCOUNTER — Encounter (INDEPENDENT_AMBULATORY_CARE_PROVIDER_SITE_OTHER): Payer: Self-pay | Admitting: Family

## 2023-02-08 NOTE — Patient Instructions (Addendum)
Continue current antiseizure medications including: Carbamazepine 150 mg 3 times daily Clobazam 10 mg in the morning 10 mg in the afternoon and 20 mg at bedtime Lacosamide 100 mg in the morning and 200 mg at night Levetiracetam 1000 mg in the morning and 1250 mg at night Trokendi 250 mg at night Will add Epidiolex 1.4 mL twice a day. Will check antiseizure medications level today VNS settings changed today, patient tolerated procedure well Continue with the combination of Ativan and phenobarbital as rescue medication Follow up in 2 months or sooner if worse

## 2023-02-09 ENCOUNTER — Telehealth: Payer: Self-pay | Admitting: Neurology

## 2023-02-09 NOTE — Telephone Encounter (Signed)
[facility] called from [facility]. Stated pt needs a PA for cannabidiol (EPIDIOLEX) 100 MG/ML solution. Stated they have reached out to office  several times about PA but no response.

## 2023-02-10 ENCOUNTER — Other Ambulatory Visit (HOSPITAL_COMMUNITY): Payer: Self-pay

## 2023-02-10 NOTE — Telephone Encounter (Signed)
Per Epic medication prescribed list it appears that it should be at [facility].

## 2023-02-10 NOTE — Telephone Encounter (Signed)
Call to patients mother to make aware that a PA is not needed for the Epidiolex. She would life verification on where mediation is at. Appears to be at [facility] but advised I would find out and follow up.

## 2023-02-10 NOTE — Telephone Encounter (Signed)
   I ran a test claim with as a [employer] and it was successful. Not sure why they are saying a PA is needed.

## 2023-02-11 ENCOUNTER — Telehealth: Payer: Self-pay | Admitting: Neurology

## 2023-02-11 ENCOUNTER — Telehealth: Payer: Self-pay

## 2023-02-11 NOTE — Telephone Encounter (Signed)
A new telephone call encounter has been made for this PA Request-please see telephone note dated .Marland KitchenMarland Kitchen5/22/2024

## 2023-02-11 NOTE — Telephone Encounter (Signed)
Faxed [employer] PA form along with clinicals to [phone].

## 2023-02-11 NOTE — Telephone Encounter (Signed)
Pt mother called, stated pt need PA for cannabidiol (EPIDIOLEX) 100 MG/ML solution. Please call [phone] for PA.

## 2023-02-13 LAB — COMPREHENSIVE METABOLIC PANEL
ALT: 26 IU/L (ref 0–44)
AST: 19 IU/L (ref 0–40)
Albumin/Globulin Ratio: 1.5 (ref 1.2–2.2)
Albumin: 4.4 g/dL (ref 4.1–5.1)
Alkaline Phosphatase: 97 IU/L (ref 44–121)
BUN/Creatinine Ratio: 16 (ref 9–20)
BUN: 15 mg/dL (ref 6–20)
Bilirubin Total: <0.2 mg/dL (ref 0.0–1.2)
CO2: 22 mmol/L (ref 20–29)
Calcium: 9.4 mg/dL (ref 8.7–10.2)
Chloride: 109 mmol/L — ABNORMAL HIGH (ref 96–106)
Creatinine, Ser: 0.92 mg/dL (ref 0.76–1.27)
Globulin, Total: 3 g/dL (ref 1.5–4.5)
Glucose: 96 mg/dL (ref 70–99)
Potassium: 4.4 mmol/L (ref 3.5–5.2)
Sodium: 144 mmol/L (ref 134–144)
Total Protein: 7.4 g/dL (ref 6.0–8.5)
eGFR: 111 mL/min/{1.73_m2} (ref >59)

## 2023-02-13 LAB — VITAMIN D 25 HYDROXY (VIT D DEFICIENCY, FRACTURES): Vit D, 25-Hydroxy: 30.1 ng/mL (ref 30.0–100.0)

## 2023-02-13 LAB — TOPIRAMATE LEVEL: Topiramate Lvl: 8.2 ug/mL (ref 2.0–25.0)

## 2023-02-13 LAB — CLOBAZAM
Clobazam: 751 ng/mL — ABNORMAL HIGH (ref 30–300)
Desmethylclobazam: 6514 ng/mL — ABNORMAL HIGH (ref 300–3000)

## 2023-02-13 LAB — LEVETIRACETAM LEVEL: Levetiracetam Lvl: 28.5 ug/mL (ref 10.0–40.0)

## 2023-02-13 LAB — CARBAMAZEPINE LEVEL, TOTAL: Carbamazepine (Tegretol), S: 10.7 ug/mL (ref 4.0–12.0)

## 2023-02-13 LAB — LACOSAMIDE: Lacosamide: 8.1 ug/mL (ref 5.0–10.0)

## 2023-02-13 NOTE — Progress Notes (Signed)
Please call family and inquire if they started the Epidiolex?   Thanks

## 2023-02-19 ENCOUNTER — Telehealth: Payer: Self-pay

## 2023-02-19 NOTE — Telephone Encounter (Signed)
Call from patients mother inquiring on status of PA for epidiolex. They are going on vacation 03/13/23 and mom would like to have medication before they travel. Advised I would send message to inquire/expedite process. Mother appreciative.

## 2023-02-23 ENCOUNTER — Other Ambulatory Visit (HOSPITAL_COMMUNITY): Payer: Self-pay

## 2023-02-23 NOTE — Telephone Encounter (Signed)
Pharmacy Patient Advocate Encounter  Prior Authorization for Epidiolex Oral Solution 100MG /ML has been APPROVED by [place] [employer]ICA[employer] from 02/13/2023 to 03/22/2023.   PA # [id number]  The approval will end by 03/22/2023 due to PT is changing plans on July the 1st and will need to have a new PA done once his plan changes.  Call Reference: [id number]

## 2023-02-23 NOTE — Progress Notes (Unsigned)
No chief complaint on file.   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 He was initially seen as a new consult for his atrial fibrillation by Dr. Berton Mount on 04/08/12. He was admitted on 04/08/12 to get a [name] replacement. They removed the old [name] but were not able to insert a new one because of right-sided central venous occlusion. During wire manipulatoin, he developed rapid atrial fibrillation.  In PACU, the atrial fibrillation resolved without medical intervention. He has had no atrial fibrillation since then. He has frequent seizures for many years. Cardiac monitor in 2015 showed sinus rhythm with no arrhythmias. Last echo May 2021 with LVEF=55-60%, mild to moderate mitral regurgitation.  He is here today for follow up. The patient denies any chest pain, dyspnea, palpitations, lower extremity edema, orthopnea, PND, dizziness, near syncope or syncope.    Primary Care Physician: Eartha Inch, MD  Past Medical History:  Diagnosis Date   Afib [facility])    only per portocath removal.   ALLERGIC RHINITIS 12/26/2009   Atrial fibrillation (HCC) 04/08/2012   Formatting of this note might be different from the original.  Last Assessment & Plan:   One episode during a procedure in July. Likely induced by the wire. No recurrence. He is in normal sinus rhythm today. No further workup or treatment.   Overview:   Paroxysmal - occurred during a procedure - has been anticoagulated with Coumadin but initiated due to DVT  Formatting of this note might be differ   Blood clot in vein 2015   Brown's syndrome    Complication of anesthesia    seizure after pac surgery at [facility] 2013 had to be intubated, no other anes issurs   Growth disorder    Headache    migraines   Heart  murmur    moderate MR ses dr Jamesetta So   Hyperlipidemia    No therapy   Hypokalemia    Mental retardation, mild (I.Q. 50-70)    MITRAL VALVE PROLAPSE 06/22/2007   SEIZURE DISORDER 06/22/2007   last seizure 08-02-2019   Sleep apnea    cpap , last sleep study 10/01/11   Unspecified hypothyroidism 06/22/2007    Past Surgical History:  Procedure Laterality Date   ANKLE SURGERY Left    fused does not bend, wears right leg brace 2018 at [facility]   BIOPSY  08/22/2019   Procedure: BIOPSY;  Surgeon: Napoleon Form, MD;  Location: [facility];  Service: Endoscopy;;   BIOPSY  10/02/2022   Procedure: BIOPSY;  Surgeon: Napoleon Form, MD;  Location: [facility];  Service: Gastroenterology;;   CATARACT EXTRACTION     2021   COLONOSCOPY WITH PROPOFOL N/A 08/22/2019   Procedure: COLONOSCOPY WITH PROPOFOL;  Surgeon: Napoleon Form, MD;  Location: [facility];  Service: Endoscopy;  Laterality: N/A;   COLONOSCOPY WITH PROPOFOL N/A 10/02/2022   Procedure: COLONOSCOPY WITH PROPOFOL;  Surgeon: Napoleon Form, MD;  Location: [facility];  Service: Gastroenterology;  Laterality: N/A;   EYE SURGERY     X2 for Brown Syndrome   HEMOSTASIS CLIP PLACEMENT  08/22/2019   Procedure: HEMOSTASIS CLIP PLACEMENT;  Surgeon: Napoleon Form, MD;  Location: [facility];  Service: Endoscopy;;   IMPLANTATION VAGAL NERVE STIMULATOR     Left leaves on for surgeries, new battery 2022, first put  in 2005.   POLYPECTOMY  08/22/2019   Procedure: POLYPECTOMY;  Surgeon: Napoleon Form, MD;  Location: [facility];  Service: Endoscopy;;   PORTACATH PLACEMENT  2013 and removed and new placed   right chest    Current Outpatient Medications  Medication Sig Dispense Refill   albuterol (ACCUNEB) 1.25 MG/3ML nebulizer solution Take 1 ampule by nebulization every 4 (four) hours as needed for wheezing.     Calcium-Vitamin D-Vitamin K (VIACTIV CALCIUM PLUS D) 650-12.5-40 MG-MCG-MCG CHEW Chew 1 each by  mouth daily.     cannabidiol (EPIDIOLEX) 100 MG/ML solution Take 1.4 mLs (140 mg total) by mouth 2 (two) times daily. 300 mL 3   carbamazepine (TEGRETOL) 100 MG chewable tablet CHEW 2 TABLETS IN THE MORNING, 2 TABLETS AT MIDDAY AND 1 & 1/2 TABLETS IN THE EVENING (Patient taking differently: Chew 150 mg by mouth 3 (three) times daily. CHEW 1 1/2 TABLETS IN THE MORNING, 1 1/2 TABLETS AT MIDDAY AND 1 & 1/2 TABLETS IN THE EVENING) 170 tablet 5   cetirizine (ZYRTEC) 10 MG tablet Take 10 mg by mouth daily.     Flaxseed, Linseed, (FLAXSEED OIL) 1000 MG CAPS Take 1,000 mg by mouth daily.     K-PHOS 500 MG tablet TAKE 1 TABLET BY MOUTH FOUR TIMES A DAY 124 tablet 5   levETIRAcetam (KEPPRA) 500 MG tablet Take 1,000-1,250 mg by mouth in the morning and at bedtime. 1,000 mg (2 tabs) in the morning & 1,250 mg (2.5 tabs) at night     levothyroxine (SYNTHROID) 50 MCG tablet Take 50 mcg by mouth daily before breakfast.     LORazepam (ATIVAN) 2 MG/ML injection Inject 1 mL (2 mg total) into the vein See admin instructions. Dilute 2mg  of lorazepam with 1mL of saline.  Administer IV push over 3-5 minutes as needed for seizures. 1 mL 5   montelukast (SINGULAIR) 10 MG tablet Take 10 mg by mouth at bedtime.  6   Multiple Vitamin (MULTIVITAMIN WITH MINERALS) TABS Take 1 tablet by mouth daily.     Omega-3 Fatty Acids (FISH OIL) 500 MG CAPS Take 500 mg by mouth daily.     [name] 10 MG tablet TAKE 1 TABLET BY MOUTH IN THE MORNING, 1 IN THE AFTERNOON AND 2 AT BEDTIME (Patient taking differently: Take 10-20 mg by mouth in the morning, at noon, and at bedtime. 10 mg in the morning, 10 mg mid-afternoon ( 330p) 20 mg at bedtime) 124 tablet 5   PHENObarbital (LUMINAL) 65 MG/ML injection Withdraw 1ml (65mg ) Phenobarbital and 4ml Normal Saline into 5ml syringe. Adminster IV push for 3-5 minutes as needed for recurrent seizures 5 mL 5   potassium chloride SA (KLOR-CON M20) 20 MEQ tablet TAKE 1 TABLET BY MOUTH EVERY DAY 31 tablet 5    promethazine (PHENERGAN) 25 MG suppository Place 25 mg rectally every 6 (six) hours as needed for nausea or vomiting.     rosuvastatin (CRESTOR) 10 MG tablet Take 10 mg by mouth at bedtime.  5   Topiramate ER (TROKENDI XR) 200 MG CP24 Take 1 capsule by mouth at bedtime. 90 capsule 3   TROKENDI XR 50 MG CP24 Take 1 capsule at nighttime 31 capsule 5   VIMPAT 100 MG TABS TAKE 1 TABLET IN THE MORNING AND TAKE 2 TABLETS AT NIGHT (Patient taking differently: Take 100-200 mg by mouth in the morning and at bedtime. 100 mg in the morning & 200 mg at bedtime) 270 tablet 1   No current facility-administered  medications for this visit.    Allergies  Allergen Reactions   Antihistamines, Chlorpheniramine-Type Other (See Comments)    Other Reaction: Dimetapp causes seizures  Allergen- Dimetapp, Reaction - seizures   Divalproex Sodium Other (See Comments)    Nausea, vomiting, liver and kidney dysfunction   Phenytoin Rash and Other (See Comments)    Dilantin causes more seizures.    Valproic Acid And Related Other (See Comments)    Shuts down systems   Vancomycin Other (See Comments), Rash and Swelling    If given too fast, causes red man reaction  Other Reaction: Redman's Syndrome  Other Reaction(s): Angioedema   Antihistamines, Diphenhydramine-Type Other (See Comments)    seizures   Phenytoin Sodium Extended Swelling    Enhances seizures  Other Reaction(s): Angioedema   Valproic Acid Other (See Comments)    Shuts down systems  Other Reaction(s): Angioedema  Starts to shut down body functions   Divalproex Sodium Rash    Nausea, vomiting, liver and kidney dysfunction    Social History   Socioeconomic History   Marital status: Single    Spouse name: Not on file   Number of children: 0   Years of education: Not on file   Highest education level: Not on file  Occupational History   Occupation: Disabled    Employer: UNEMPLOYED  Tobacco Use   Smoking status: Never    Passive  exposure: Never   Smokeless tobacco: Never  Vaping Use   Vaping Use: Never used  Substance and Sexual Activity   Alcohol use: No    Alcohol/week: 0.0 standard drinks of alcohol   Drug use: No   Sexual activity: Never  Other Topics Concern   Not on file  Social History Narrative   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 Disabled.    He lives with his parents.    He enjoys helping around the house, Nascar, and football.   Caffiene soda 's with caffeien 2-3 / day   Social Determinants of Health   Financial Resource Strain: Not on file  Food Insecurity: No Food Insecurity (06/01/2022)   Hunger Vital Sign    Worried About Running Out of Food in the Last Year: Never true    Ran Out of Food in the Last Year: Never true  Transportation Needs: No Transportation Needs (06/01/2022)   PRAPARE - Administrator, Civil Service (Medical): No    Lack of Transportation (Non-Medical): No  Physical Activity: Not on file  Stress: Not on file  Social Connections: Not on file  Intimate Partner Violence: Patient Declined (06/01/2022)   Humiliation, Afraid, Rape, and Kick questionnaire    Fear of Current or Ex-Partner: Patient declined    Emotionally Abused: Patient declined    Physically Abused: Patient declined    Sexually Abused: Patient declined    Family History  Problem Relation Age of Onset   Breast cancer Mother    Colon polyps Mother    Clotting disorder Mother    Diabetes Father    Hypertension Father    Colon polyps Father    Hyperlipidemia Father    Atrial fibrillation Father    Diabetes Maternal Grandmother    Heart disease Maternal Grandmother    Diabetes Maternal Grandfather    Heart disease Maternal Grandfather    Coronary artery disease Maternal Grandfather    Congestive Heart Failure Maternal Grandfather  Died at 56   Pneumonia Maternal Grandfather    Diabetes Paternal Grandmother    Heart  disease Paternal Grandmother    Pneumonia Paternal Grandmother        Died at 38   Stroke Paternal Grandmother    Diabetes Paternal Grandfather    Heart disease Paternal Grandfather    Coronary artery disease Paternal Grandfather    Lung cancer Paternal Grandfather        Died at 88    Review of Systems:  As stated in the HPI and otherwise negative.   There were no vitals taken for this visit.  Physical Examination: General: Well developed, well nourished, NAD  HEENT: OP clear, mucus membranes moist  SKIN: warm, dry. No rashes. Neuro: No focal deficits  Musculoskeletal: Muscle strength 5/5 all ext  Psychiatric: Mood and affect normal  Neck: No JVD, no carotid bruits, no thyromegaly, no lymphadenopathy.  Lungs:Clear bilaterally, no wheezes, rhonci, crackles Cardiovascular: Regular rate and rhythm. No murmurs, gallops or rubs. Abdomen:Soft. Bowel sounds present. Non-tender.  Extremities: No lower extremity edema. Pulses are 2 + in the bilateral DP/PT.  Echo May 2021:  1. Normal LV function; MR not well assessed but appears mild to moderate.   2. Left ventricular ejection fraction, by estimation, is 55 to 60%. The  left ventricle has normal function. The left ventricle has no regional  wall motion abnormalities. Left ventricular diastolic parameters were  normal.   3. Right ventricular systolic function is normal. The right ventricular  size is normal. There is normal pulmonary artery systolic pressure.   4. The mitral valve is normal in structure. Mild to moderate mitral valve  regurgitation. No evidence of mitral stenosis.   5. The aortic valve is tricuspid. Aortic valve regurgitation is not  visualized. Mild aortic valve sclerosis is present, with no evidence of  aortic valve stenosis.   6. The inferior vena cava is normal in size with greater than 50%  respiratory variability, suggesting right atrial pressure of 3 mmHg.    EKG:  EKG is *** ordered today. The ekg ordered  today demonstrates   Recent Labs: 11/20/2022: Hemoglobin 12.8; Platelets 233 11/21/2022: Magnesium 2.0 02/04/2023: ALT 26; BUN 15; Creatinine, Ser 0.92; Potassium 4.4; Sodium 144    Wt Readings from Last 3 Encounters:  02/05/23 57.2 kg  02/04/23 56.5 kg  01/29/23 57.6 kg    Assessment and Plan:   1. Atrial fibrillation, paroxysmal: One episode during a procedure in July 2013. Likely induced by the wire. Sinus today.     2. Mitral regurgitation: Mild to moderate MR by echo in 2021.  *** Repeat echo now.   Labs/ tests ordered today include:  No orders of the defined types were placed in this encounter.  Disposition:   F/U with me in 12  months  Signed, Verne Carrow, MD 02/23/2023 8:48 AM    [facility] [address] [place], [place], [place]  [address] Phone: [phone]; Fax: [phone]

## 2023-02-24 ENCOUNTER — Encounter: Payer: Self-pay | Admitting: Cardiovascular Disease

## 2023-02-24 ENCOUNTER — Ambulatory Visit: Payer: Medicaid Other | Attending: Cardiovascular Disease | Admitting: Cardiovascular Disease

## 2023-02-24 VITALS — BP 100/74 | HR 70 | Ht 63.0 in | Wt 127.0 lb

## 2023-02-24 DIAGNOSIS — I340 Nonrheumatic mitral (valve) insufficiency: Secondary | ICD-10-CM | POA: Diagnosis present

## 2023-02-24 NOTE — Patient Instructions (Signed)
Medication Instructions:  No changes *If you need a refill on your cardiac medications before your next appointment, please call your pharmacy*   Lab Work: none If you have labs (blood work) drawn today and your tests are completely normal, you will receive your results only by: MyChart Message (if you have MyChart) OR A paper copy in the mail If you have any lab test that is abnormal or we need to change your treatment, we will call you to review the results.   Testing/Procedures: none   Follow-Up: At [facility], you and your health needs are our priority.  As part of our continuing mission to provide you with exceptional heart care, we have created designated Provider Care Teams.  These Care Teams include your primary Cardiologist (physician) and Advanced Practice Providers (APPs -  Physician Assistants and Nurse Practitioners) who all work together to provide you with the care you need, when you need it.   Your next appointment:   12 month(s)  Provider:   Christopher McAlhany, MD      

## 2023-03-30 ENCOUNTER — Encounter: Payer: Self-pay | Admitting: Neurology

## 2023-03-30 ENCOUNTER — Other Ambulatory Visit: Payer: Self-pay | Admitting: Neurology

## 2023-03-30 NOTE — Telephone Encounter (Signed)
Since unable to tolerate the Epidiolex, ok to discontinue the medication. Thanks

## 2023-04-08 ENCOUNTER — Ambulatory Visit (INDEPENDENT_AMBULATORY_CARE_PROVIDER_SITE_OTHER): Payer: MEDICAID | Admitting: Neurology

## 2023-04-08 ENCOUNTER — Encounter: Payer: Self-pay | Admitting: Neurology

## 2023-04-08 ENCOUNTER — Other Ambulatory Visit: Payer: Self-pay | Admitting: Neurology

## 2023-04-08 DIAGNOSIS — G40119 Localization-related (focal) (partial) symptomatic epilepsy and epileptic syndromes with simple partial seizures, intractable, without status epilepticus: Secondary | ICD-10-CM | POA: Diagnosis not present

## 2023-04-08 DIAGNOSIS — G40401 Other generalized epilepsy and epileptic syndromes, not intractable, with status epilepticus: Secondary | ICD-10-CM

## 2023-04-08 DIAGNOSIS — E876 Hypokalemia: Secondary | ICD-10-CM

## 2023-04-08 DIAGNOSIS — G40319 Generalized idiopathic epilepsy and epileptic syndromes, intractable, without status epilepticus: Secondary | ICD-10-CM

## 2023-04-08 MED ORDER — LEVETIRACETAM 500 MG PO TABS: 1500.0000 mg | ORAL_TABLET | Freq: Two times a day (BID) | ORAL | 3 refills | Status: DC

## 2023-04-08 MED ORDER — CARBAMAZEPINE 100 MG PO CHEW: 150.0000 mg | CHEWABLE_TABLET | Freq: Three times a day (TID) | ORAL | 0 refills | Status: DC

## 2023-04-08 MED ORDER — ONFI 10 MG PO TABS: ORAL_TABLET | ORAL | 5 refills | Status: DC

## 2023-04-08 MED ORDER — VIMPAT 100 MG PO TABS: ORAL_TABLET | ORAL | 1 refills | Status: DC

## 2023-04-08 MED ORDER — POTASSIUM CHLORIDE CRYS ER 20 MEQ PO TBCR: 20.0000 meq | EXTENDED_RELEASE_TABLET | Freq: Every day | ORAL | 5 refills | Status: DC

## 2023-04-08 MED ORDER — TROKENDI XR 50 MG PO CP24: ORAL_CAPSULE | ORAL | 5 refills | Status: DC

## 2023-04-08 MED ORDER — TOPIRAMATE ER 200 MG PO CAP24: 1.0000 | ORAL_CAPSULE | Freq: Every day | ORAL | 3 refills | Status: DC

## 2023-04-08 NOTE — Patient Instructions (Addendum)
Decrease clobazam to 10 mg in the morning and 20 mg at night Increase levetiracetam to 1500 mg twice daily Continue carbamazepine 150 mg 3 times daily Continue Trokendi 250 mg nightly Continue lacosamide 100 mg in the morning and 200 mg at night Will call him in 4 weeks for telephone visit. Also advised them to swipe the VNS multiple times per day.  Follow-up in office in 6 months or sooner if worse.

## 2023-04-08 NOTE — Progress Notes (Addendum)
[facility]  PATIENT: [name] DOB: [birth year]  REQUESTING CLINICIAN: Eartha Inch, MD HISTORY FROM: Mother, sister and chart review  REASON FOR VISIT: Transitioning from peds to adult. Epilepsy management.    HISTORICAL  CHIEF COMPLAINT:  Chief Complaint  Patient presents with   Seizures    Rm12, mom&dad present  EA:VWUJ one was 6 days ago, I have pended [facility] to [facility] since he is no longer seeing provider at [facility]. Then after you send those he needs ativan and phenobarbital need to be sent [facility]     INTERVAL HISTORY 04/08/2023:  [name] presents for follow-up, he is accompanied by mother and father.  Last visit was in May at that time we started him on Epidiolex but it caused side effects of dizziness, gait instability, and somnolence.  He could not walk.  Due to these side effects, Epidiolex was discontinued.  While on Epidiolex he continued to have seizures 2 to 3/month.  His seizure frequency has not changed again 2 to 3/month.  He is compliant with his medications, does not swipe his VNS multiple times per day.  Family do not have any major concerns currently.   HISTORY OF PRESENT ILLNESS:  This is a 37 year old gentleman with multiple medical conditions including intellectual disability, intractable epilepsy status post VNS placement, hypothyroidism, hormone deficiency, obstructive sleep apnea on CPAP who is presenting to establish care.  Patient was initially previously managed by pediatric neurology and Dr. Zara Council at [facility] but now he needs a local adult neurologist.   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  6 months later he started having his first seizure.  Since then his seizures have been intractable.  He had multiple type of  seizures including tonic seizures, bilateral tonic-clonic seizures,  focal sensory seizures where he is aware, atonic seizure where he will fall and staring seizures.  He has tried multiple medication listed below.   Currently he is on the combination on 5 antiseizure medications including Tegretol 150 mg 3 times daily, clobazam 10/10 and 20 mg at bedtime, Vimpat 100 mg in the morning 200 at night, Keppra 1000 mg in the morning and 1250 in the evening and Trokendi 250 at bedtime.  Family reports that his seizures remain intractable despite VNS therapy.  He continued to have 2-3 seizures a month.  He was admitted last in the hospital in February for seizures and his last seizure was on May 13.  Family has reported prolonged seizure where he will need CPR, he also had status where he was intubated but currently they feel like the seizures are stable not getting worse even though he is getting 2 to 3/month.  Denies any history of birth trauma and he did not stay in the ICU as a baby. They could not related any seizure risk factors.    Handedness: Right   Onset: At the age of 7  Seizure Type: Tonic seizures, bilateral tonic clonic seizures, atonic seizures (drop seizure), focal sensory seizures and staring seizures  Current frequency: Last seizures 5/13, 2 to 3 seizures a months   Any injuries from seizures: Fall, concussion   Seizure risk factors: None reported   Previous ASMs: Gabapentin, Tegretol, Lamotrigine, Felbatol, Depakote, Topamax, Keppra, [name], Phenobarbital, Lacosamide, Phenytoin  Currenty ASMs: Tegretol 150 mg TID, Clobazam 07/02/19 mg, Vimpat 100/200  mg, Keppra 1000/1250 mg, Trokendi 250 mg qHs   ASMs side effects: Allergic reaction to Phenytoin and Dilantin   Brain Images: No intracranial pathology. Last CT with subdural collection   Previous EEGs: Diffuse slow, focal left slowing   OTHER MEDICAL CONDITIONS: Intellectual disability, Intractable seizures s/p VNS placement, OSA on CPAP, Hypothyroidism   REVIEW OF SYSTEMS: Full 14 system review of  systems performed and negative with exception of: As noted in the HPI   ALLERGIES: Allergies  Allergen Reactions   Antihistamines, Chlorpheniramine-Type Other (See Comments)    Other Reaction: Dimetapp causes seizures  Allergen- Dimetapp, Reaction - seizures   Divalproex Sodium Other (See Comments)    Nausea, vomiting, liver and kidney dysfunction   Phenytoin Rash and Other (See Comments)    Dilantin causes more seizures.    Valproic Acid And Related Other (See Comments)    Shuts down systems   Vancomycin Other (See Comments), Rash and Swelling    If given too fast, causes red man reaction  Other Reaction: Redman's Syndrome  Other Reaction(s): Angioedema   Antihistamines, Diphenhydramine-Type Other (See Comments)    seizures   Phenytoin Sodium Extended Swelling    Enhances seizures  Other Reaction(s): Angioedema   Valproic Acid Other (See Comments)    Shuts down systems  Other Reaction(s): Angioedema  Starts to shut down body functions   Divalproex Sodium Rash    Nausea, vomiting, liver and kidney dysfunction    HOME MEDICATIONS: Outpatient Medications Prior to Visit  Medication Sig Dispense Refill   albuterol (ACCUNEB) 1.25 MG/3ML nebulizer solution Take 1 ampule by nebulization every 4 (four) hours as needed for wheezing.     Calcium-Vitamin D-Vitamin K (VIACTIV CALCIUM PLUS D) 650-12.5-40 MG-MCG-MCG CHEW Chew 1 each by mouth daily.     cetirizine (ZYRTEC) 10 MG tablet Take 10 mg by mouth daily.     Flaxseed, Linseed, (FLAXSEED OIL) 1000 MG CAPS Take 1,000 mg by mouth daily.     K-PHOS 500 MG tablet TAKE 1 TABLET BY MOUTH FOUR TIMES A DAY 124 tablet 5   levothyroxine (SYNTHROID) 50 MCG tablet Take 50 mcg by mouth daily before breakfast.     montelukast (SINGULAIR) 10 MG tablet Take 10 mg by mouth at bedtime.  6   Multiple Vitamin (MULTIVITAMIN WITH MINERALS) TABS Take 1 tablet by mouth daily.     Omega-3 Fatty Acids (FISH OIL) 500 MG CAPS Take 500 mg by mouth daily.      promethazine (PHENERGAN) 25 MG suppository Place 25 mg rectally every 6 (six) hours as needed for nausea or vomiting.     rosuvastatin (CRESTOR) 10 MG tablet Take 10 mg by mouth at bedtime.  5   carbamazepine (TEGRETOL) 100 MG chewable tablet CHEW 2 TABLETS IN THE MORNING, 2 TABLETS AT MIDDAY AND 1 & 1/2 TABLETS IN THE EVENING (Patient taking differently: Chew 150 mg by mouth 3 (three) times daily. CHEW 1 &1/2 TABLETS IN THE MORNING, 2 TABLETS AT MIDDAY AND 1 & 1/2 TABLETS IN THE EVENING) 170 tablet 5   levETIRAcetam (KEPPRA) 500 MG tablet Take 1,000-1,250 mg by mouth in the morning and at bedtime. 1,000 mg (2 tabs) in the morning & 1,250 mg (2.5 tabs) at night     LORazepam (ATIVAN) 2 MG/ML injection Inject 1 mL (2 mg total) into the vein See admin instructions. Dilute 2mg  of lorazepam with 1mL of saline.  Administer IV push over 3-5 minutes as needed for seizures. 1 mL 5   [name]  10 MG tablet TAKE 1 TABLET BY MOUTH IN THE MORNING, 1 IN THE AFTERNOON AND 2 AT BEDTIME (Patient taking differently: Take 10-20 mg by mouth in the morning, at noon, and at bedtime. 10 mg in the morning, 10 mg mid-afternoon ( 330p) 20 mg at bedtime) 124 tablet 5   PHENObarbital (LUMINAL) 65 MG/ML injection Withdraw 1ml (65mg ) Phenobarbital and 4ml Normal Saline into 5ml syringe. [place] IV push for 3-5 minutes as needed for recurrent seizures 5 mL 5   potassium chloride SA (KLOR-CON M20) 20 MEQ tablet TAKE 1 TABLET BY MOUTH EVERY DAY 31 tablet 5   Topiramate ER (TROKENDI XR) 200 MG CP24 Take 1 capsule by mouth at bedtime. (Patient taking differently: Take 1 capsule by mouth at bedtime. Per patient taking total 250 mg) 90 capsule 3   TROKENDI XR 50 MG CP24 Take 1 capsule at nighttime 31 capsule 5   VIMPAT 100 MG TABS TAKE 1 TABLET IN THE MORNING AND TAKE 2 TABLETS AT NIGHT (Patient taking differently: Take 100-200 mg by mouth in the morning and at bedtime. 100 mg in the morning & 200 mg at bedtime) 270 tablet 1   No  facility-administered medications prior to visit.    PAST MEDICAL HISTORY: Past Medical History:  Diagnosis Date   Afib (HCC)    only per portocath removal.   ALLERGIC RHINITIS 12/26/2009   Atrial fibrillation (HCC) 04/08/2012   Formatting of this note might be different from the original.  Last Assessment & Plan:   One episode during a procedure in July. Likely induced by the wire. No recurrence. He is in normal sinus rhythm today. No further workup or treatment.   Overview:   Paroxysmal - occurred during a procedure - has been anticoagulated with Coumadin but initiated due to DVT  Formatting of this note might be differ   Blood clot in vein 2015   Brown's syndrome    Complication of anesthesia    seizure after pac surgery at [facility] 2013 had to be intubated, no other anes issurs   Growth disorder    Headache    migraines   Heart murmur    moderate MR ses dr Jamesetta So   Hyperlipidemia    No therapy   Hypokalemia    Mental retardation, mild (I.Q. 50-70)    MITRAL VALVE PROLAPSE 06/22/2007   SEIZURE DISORDER 06/22/2007   last seizure 08-02-2019   Sleep apnea    cpap , last sleep study 10/01/11   Unspecified hypothyroidism 06/22/2007    PAST SURGICAL HISTORY: Past Surgical History:  Procedure Laterality Date   ANKLE SURGERY Left    fused does not bend, wears right leg brace 2018 at [facility]   BIOPSY  08/22/2019   Procedure: BIOPSY;  Surgeon: Napoleon Form, MD;  Location: [facility];  Service: Endoscopy;;   BIOPSY  10/02/2022   Procedure: BIOPSY;  Surgeon: Napoleon Form, MD;  Location: [facility];  Service: Gastroenterology;;   CATARACT EXTRACTION     2021   COLONOSCOPY WITH PROPOFOL N/A 08/22/2019   Procedure: COLONOSCOPY WITH PROPOFOL;  Surgeon: Napoleon Form, MD;  Location: [facility];  Service: Endoscopy;  Laterality: N/A;   COLONOSCOPY WITH PROPOFOL N/A 10/02/2022   Procedure: COLONOSCOPY WITH PROPOFOL;  Surgeon: Napoleon Form, MD;  Location: [facility];  Service: Gastroenterology;  Laterality: N/A;   EYE SURGERY     X2 for Brown Syndrome   HEMOSTASIS CLIP PLACEMENT  08/22/2019   Procedure: HEMOSTASIS CLIP PLACEMENT;  Surgeon: Napoleon Form, MD;  Location: [facility];  Service: Endoscopy;;   IMPLANTATION VAGAL NERVE STIMULATOR     Left leaves on for surgeries, new battery 2022, first put in 2005.   POLYPECTOMY  08/22/2019   Procedure: POLYPECTOMY;  Surgeon: Napoleon Form, MD;  Location: [facility];  Service: Endoscopy;;   PORTACATH PLACEMENT  2013 and removed and new placed   right chest    FAMILY HISTORY: Family History  Problem Relation Age of Onset   Breast cancer Mother    Colon polyps Mother    Clotting disorder Mother    Diabetes Father    Hypertension Father    Colon polyps Father    Hyperlipidemia Father    Atrial fibrillation Father    Diabetes Maternal Grandmother    Heart disease Maternal Grandmother    Diabetes Maternal Grandfather    Heart disease Maternal Grandfather    Coronary artery disease Maternal Grandfather    Congestive Heart Failure Maternal Grandfather        Died at 75   Pneumonia Maternal Grandfather    Diabetes Paternal Grandmother    Heart disease Paternal Grandmother    Pneumonia Paternal Grandmother        Died at 74   Stroke Paternal Grandmother    Diabetes Paternal Grandfather    Heart disease Paternal Grandfather    Coronary artery disease Paternal Grandfather    Lung cancer Paternal Grandfather        Died at 46    SOCIAL HISTORY: Social History   Socioeconomic History   Marital status: Single    Spouse name: Not on file   Number of children: 0   Years of education: Not on file   Highest education level: Not on file  Occupational History   Occupation: Disabled    Employer: UNEMPLOYED  Tobacco Use   Smoking status: Never    Passive exposure: Never   Smokeless tobacco: Never  Vaping Use   Vaping status: Never Used  Substance and Sexual Activity    Alcohol use: No    Alcohol/week: 0.0 standard drinks of alcohol   Drug use: No   Sexual activity: Never  Other Topics Concern   Not on file  Social History Narrative   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 Disabled.    He lives with his parents.    He enjoys helping around the house, Nascar, and football.   Caffiene soda 's with caffeien 2-3 / day   Social Determinants of Health   Financial Resource Strain: Low Risk  (11/25/2022)   Received from [facility], [facility]   Overall Financial Resource Strain (CARDIA)    Difficulty of Paying Living Expenses: Not hard at all  Food Insecurity: No Food Insecurity (11/25/2022)   Received from [facility], [facility]   Hunger Vital Sign    Worried About Running Out of Food in the Last Year: Never true    Ran Out of Food in the Last Year: Never true  Transportation Needs: No Transportation Needs (11/25/2022)   Received from [facility], [facility]   PRAPARE - Transportation    Lack of Transportation (Medical): No    Lack of Transportation (Non-Medical): No  Physical Activity: Not on file  Stress: Not on file  Social Connections: Unknown (01/21/2022)   Received from [facility]   Social Network    Social Network: Not on  file  Intimate Partner Violence: Patient Declined (06/01/2022)   Humiliation, Afraid, Rape, and Kick questionnaire    Fear of Current or Ex-Partner: Patient declined    Emotionally Abused: Patient declined    Physically Abused: Patient declined    Sexually Abused: Patient declined    PHYSICAL EXAM  GENERAL EXAM/CONSTITUTIONAL: Vitals:  Vitals:   04/08/23 1511  BP: 103/71  Pulse: 80  Weight: 130 lb (59 kg)  Height: 5\' 3"  (1.6 m)   Body mass index is 23.03 kg/m. Wt Readings from Last 3 Encounters:  04/08/23 130 lb (59 kg)  02/24/23 127 lb (57.6 kg)  02/05/23 126 lb (57.2 kg)   Patient is in no distress; well developed,  nourished and groomed; neck is supple  MUSCULOSKELETAL: Gait, strength, tone, movements noted in Neurologic exam below  NEUROLOGIC: MENTAL STATUS:      No data to display         awake, alert, oriented to person Answering appropriately Able to follow commands and direction  Able to track examiner  Normal bulk and tone, he does have contraction  Sensory normal to light touch Clumsy gait and poor balance, he does need assistance with ambulation     DIAGNOSTIC DATA (LABS, IMAGING, TESTING) - I reviewed patient records, labs, notes, testing and imaging myself where available.  Lab Results  Component Value Date   WBC 3.4 (L) 11/20/2022   HGB 12.8 (L) 11/20/2022   HCT 37.8 (L) 11/20/2022   MCV 84.8 11/20/2022   PLT 233 11/20/2022      Component Value Date/Time   NA 144 02/04/2023 0929   K 4.4 02/04/2023 0929   CL 109 (H) 02/04/2023 0929   CO2 22 02/04/2023 0929   GLUCOSE 96 02/04/2023 0929   GLUCOSE 102 (H) 11/21/2022 0658   BUN 15 02/04/2023 0929   CREATININE 0.92 02/04/2023 0929   CALCIUM 9.4 02/04/2023 0929   PROT 7.4 02/04/2023 0929   ALBUMIN 4.4 02/04/2023 0929   AST 19 02/04/2023 0929   ALT 26 02/04/2023 0929   ALKPHOS 97 02/04/2023 0929   BILITOT <0.2 02/04/2023 0929   GFRNONAA >60 11/21/2022 0658   GFRAA >60 12/06/2019 1158   Lab Results  Component Value Date   TRIG 187 (H) 02/13/2018   No results found for: "HGBA1C" No results found for: "VITAMINB12" Lab Results  Component Value Date   TSH 0.189 (L) 05/13/2014    CT Head 11/19/2022 1. 4 mm thick hypodense subdural collection over the left cerebral convexity without mass effect or midline shift is likely subacute to chronic. No evidence of acute blood products. 2. Otherwise, no evidence of acute intracranial pathology. 3. Unchanged dense basal ganglia calcifications   EEG 11/20/22: - Intermittent slow, generalized and maximal left temporal     ASSESSMENT AND PLAN  37 y.o. 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  He continues to have 2-3 seizures per month.  Plan for now is to decrease the clobazam to 10 morning and 20 at night, increase levetiracetam to 1500 mg twice daily, continue with carbamazepine 150 mg 3 times daily, Trokendi 250 mg at night and Vimpat 100 mg in the morning and 200 mg at night.  I will continue to check on him by telephone visit every 4 weeks to see if additional antiseizure medication changes can be made.  I will see him physically in the office in 6 months or sooner if worse.     1. Partial epilepsy with intractable epilepsy (HCC)   2. 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 Hypokalemia     Patient Instructions  Decrease clobazam to 10 mg in the morning and 20 mg at night Increase levetiracetam to 1500 mg twice daily Continue carbamazepine 150 mg 3 times daily Continue Trokendi 250 mg nightly Continue lacosamide 100 mg in the morning and 200 mg at night Will call him in 4 weeks for telephone visit. Also advised them to swipe the VNS multiple times per day.  Follow-up in office in 6 months or sooner if worse.   Per [facility] statutes, patients with seizures are not allowed to drive until they have been seizure-free for six months.  Other recommendations include using caution when using heavy equipment or power tools. Avoid working on ladders or at heights. Take showers instead of baths.  Do not swim alone.  Ensure the water temperature is not too high on the home water heater. Do not go swimming alone. Do not lock yourself in a room alone (i.e. bathroom). When caring for infants or small children, sit down when holding, feeding, or changing them to minimize risk  of injury to the child in the event you have a seizure. Maintain good sleep hygiene. Avoid alcohol.  Also recommend adequate sleep, hydration, good diet and minimize stress.   During the Seizure  - First, ensure adequate ventilation and place patients on the floor on their left side  Loosen clothing around the neck and ensure the airway is patent. If the patient is clenching the teeth, do not force the mouth open with any object as this can cause severe damage - Remove all items from the surrounding that can be hazardous. The patient may be oblivious to what's happening and may not even know what he or she is doing. If the patient is confused and wandering, either gently guide him/her away and block access to outside areas - Reassure the individual and be comforting - Call 911. In most cases, the seizure ends before EMS arrives. However, there are cases when seizures may last over 3 to 5 minutes. Or the individual may have developed breathing difficulties or severe injuries. If a pregnant patient or a person with diabetes develops a seizure, it is prudent to call an ambulance. - Finally, if the patient does not regain full consciousness, then call EMS. Most patients will remain confused for about 45 to 90 minutes after a seizure, so you must use judgment in calling for help. - Avoid restraints but make sure the patient is in a bed with padded side rails - Place the individual in a lateral position with the neck slightly flexed; this will help the saliva drain from the mouth and prevent the tongue from falling backward - Remove all nearby furniture and other hazards from the area - Provide verbal assurance as the individual is regaining consciousness - Provide the patient with privacy if possible - Call for help and start treatment as ordered by the caregiver   After the Seizure (Postictal Stage)  After a seizure, most patients experience confusion, fatigue, muscle pain and/or a headache. Thus,  one should permit the individual to sleep. For the next few days, reassurance is essential. Being calm and helping reorient the person is also of importance.  Most seizures are  painless and end spontaneously. Seizures are not harmful to others but can lead to complications such as stress on the lungs, brain and the heart. Individuals with prior lung problems may develop labored breathing and respiratory distress.     No orders of the defined types were placed in this encounter.   Meds ordered this encounter  Medications   carbamazepine (TEGRETOL) 100 MG chewable tablet    Sig: Chew 1.5 tablets (150 mg total) by mouth 3 (three) times daily. CHEW 1 &1/2 TABLETS IN THE MORNING, 2 TABLETS AT MIDDAY AND 1 & 1/2 TABLETS IN THE EVENING    Dispense:  270 tablet    Refill:  0   [name] 10 MG tablet    Sig: TAKE 1 TABLET BY MOUTH IN THE MORNING, 1 IN THE AFTERNOON AND 2 AT BEDTIME    Dispense:  124 tablet    Refill:  5    Brand Name Medically Necessary   VIMPAT 100 MG TABS    Sig: TAKE 1 TABLET IN THE MORNING AND TAKE 2 TABLETS AT NIGHT    Dispense:  270 tablet    Refill:  1    Brand Name Medically Necessary   potassium chloride SA (KLOR-CON M20) 20 MEQ tablet    Sig: Take 1 tablet (20 mEq total) by mouth daily.    Dispense:  31 tablet    Refill:  5   Topiramate ER (TROKENDI XR) 200 MG CP24    Sig: Take 1 capsule (200 mg total) by mouth at bedtime.    Dispense:  90 capsule    Refill:  3    Brand Name Medically Necessary; 90 day supply   TROKENDI XR 50 MG CP24    Sig: Take 1 capsule at nighttime    Dispense:  31 capsule    Refill:  5   DISCONTD: levETIRAcetam (KEPPRA) 500 MG tablet    Sig: Take 3 tablets (1,500 mg total) by mouth in the morning and at bedtime. 1,000 mg (2 tabs) in the morning & 1,250 mg (2.5 tabs) at night    Dispense:  540 tablet    Refill:  3    Return in about 6 months (around 10/09/2023).    Windell Norfolk, MD 06/15/2023, 4:18 PM  [facility] [address] [place], [place] [address] [phone]

## 2023-04-13 ENCOUNTER — Telehealth: Payer: Self-pay

## 2023-04-13 NOTE — Telephone Encounter (Signed)
In response to my staff msg to [place] NP. I just copied into telephone encounter so we could have it documented within the chart and routing to dr. Teresa Coombs

## 2023-04-13 NOTE — Telephone Encounter (Signed)
-----   Message from Elveria Rising sent at 04/13/2023 11:50 AM EDT ----- Regarding: RE: clarification Hello,  The instructions are as follows: Lorazepam 2mg /ml - withdraw 1ml (2mg ) and 1ml normal saline into syringe. Administer IV push via portacath over 3-5 minutes as needed for recurrent seizures Phenobarbital sodium 65mg /78ml - withdraw 1ml (65mg ) and 4ml normal saline into syringe. Administer IV push over 3-5 minutes via portacath as needed for recurrent seizures  The port is accessed and flushed per protocol before and after the medication administration. His parents and sister are very adept at this.   The prescription goes to the pharmacy division of [facility], which is called [facility]. Their phone # is [phone] and fax is [phone]. The quantity I have sent is for 6 vials of each medication. The pharmacy provides the portacath supplies and normal saline for mixing and flushing with that prescription.   They typically send me a prescription to sign and renew about every 6 months or so. I can forward that to you the next time I receive it. I last sent it in January 2024.  Thank you for taking care of [name]. Please let me know if you have any other questions.  Elveria Rising ----- Message ----- From: Eather Colas, CMA Sent: 04/13/2023  11:05 AM EDT To: Elveria Rising, NP; Windell Norfolk, MD; # Subject: clarification                                  Mrs. Blane Ohara,  I work at [facility] for Dr. Teresa Coombs. He had wanted me to get in touch with you in regards to  Mr. Stodghill. His rescue meds are iv phenobarbital and iv ativan. He wanted to ask the correct way of dosing that and where exactly do we send the prescription. Is there any tubing or priming needed? Thanks,  Eather Colas  RMA/CNA/CPT  [facility] [phone] [facility]

## 2023-04-29 ENCOUNTER — Ambulatory Visit (INDEPENDENT_AMBULATORY_CARE_PROVIDER_SITE_OTHER): Payer: Medicaid Other | Admitting: Family

## 2023-05-05 ENCOUNTER — Telehealth: Payer: Self-pay

## 2023-05-05 ENCOUNTER — Other Ambulatory Visit: Payer: Self-pay | Admitting: Neurology

## 2023-05-05 ENCOUNTER — Telehealth: Payer: Self-pay | Admitting: Neurology

## 2023-05-05 DIAGNOSIS — G40401 Other generalized epilepsy and epileptic syndromes, not intractable, with status epilepticus: Secondary | ICD-10-CM

## 2023-05-05 DIAGNOSIS — G40319 Generalized idiopathic epilepsy and epileptic syndromes, intractable, without status epilepticus: Secondary | ICD-10-CM

## 2023-05-05 MED ORDER — LORAZEPAM 2 MG/ML IJ SOLN
2.0000 mg | INTRAMUSCULAR | 5 refills | Status: AC
Start: 2023-05-05 — End: ?

## 2023-05-05 MED ORDER — PHENOBARBITAL SODIUM 65 MG/ML IJ SOLN
INTRAMUSCULAR | 5 refills | Status: DC
Start: 2023-05-05 — End: 2023-05-05

## 2023-05-05 NOTE — Telephone Encounter (Signed)
Scripts for phenobarbital and ativan faxed to [facility]

## 2023-05-05 NOTE — Telephone Encounter (Signed)
[name] already faxed it in

## 2023-05-05 NOTE — Telephone Encounter (Signed)
Prescription completed and signed. Please fax it to pharmacy. Thanks

## 2023-05-05 NOTE — Telephone Encounter (Signed)
The prescription goes to the pharmacy division of [facility], which is called [facility]. Their phone # is [phone] and fax is [phone]. The quantity I have sent is for 6 vials of each medication. The pharmacy provides the portacath supplies and normal saline for mixing and flushing with that prescription.

## 2023-05-05 NOTE — Telephone Encounter (Signed)
Pharmacist E[facility]lene Browns from [facility] [phone] reports that they do not have  PHENObarbital (LUMINAL) 65 MG/ML injection  or LORazepam (ATIVAN) 2 MG/ML injection. It was suggested to reach out to a hospital pharmacy.

## 2023-05-05 NOTE — Telephone Encounter (Signed)
Camars resend thru epic please.

## 2023-05-06 ENCOUNTER — Telehealth: Payer: Self-pay

## 2023-05-06 MED ORDER — PHENOBARBITAL SODIUM 65 MG/ML IJ SOLN
INTRAMUSCULAR | 5 refills | Status: AC
Start: 2023-05-06 — End: ?

## 2023-05-06 MED ORDER — LORAZEPAM 2 MG/ML IJ SOLN
2.0000 mg | INTRAMUSCULAR | 5 refills | Status: DC
Start: 2023-05-06 — End: 2024-03-02

## 2023-05-06 NOTE — Telephone Encounter (Signed)
It wont e prescribed

## 2023-05-06 NOTE — Telephone Encounter (Signed)
Scripts faxed to [facility] [phone]

## 2023-05-22 ENCOUNTER — Telehealth: Payer: Self-pay | Admitting: Neurology

## 2023-05-22 NOTE — Telephone Encounter (Signed)
Call back to mother Tyler Aas, she states she was calling to update Dr. Teresa Coombs as he had requested since making medication changes. The dose adjustments to [name] and Keppra have been helpful with no know side effects and Tyler Aas denies SI/HI for patient. She did state that around the 3rd of August patient had a bad migraine and was nauseated and unable to take medications and then on 8/12, patient has 3 breakthrough seizures (1 grand mal and 2 petit mal). They have not been swiping the VNS magnet as frequent as they can so education and encouragement on swiping at medication times, meal times, before, during and after seizures. Doris verbalized understanding. She also confirmed that they are no longer taking the epidiolex. Advised I will update Dr. Teresa Coombs and follow up with any advice or recommendations. Doris Adult nurse of call

## 2023-05-22 NOTE — Telephone Encounter (Signed)
Pt's mother called asking for the MD to call her back to discuss the results of the medication changes that were done. Mother would not give any other information.

## 2023-06-15 ENCOUNTER — Other Ambulatory Visit: Payer: Self-pay

## 2023-06-15 ENCOUNTER — Telehealth: Payer: Self-pay

## 2023-06-15 DIAGNOSIS — G40319 Generalized idiopathic epilepsy and epileptic syndromes, intractable, without status epilepticus: Secondary | ICD-10-CM

## 2023-06-15 DIAGNOSIS — G40119 Localization-related (focal) (partial) symptomatic epilepsy and epileptic syndromes with simple partial seizures, intractable, without status epilepticus: Secondary | ICD-10-CM

## 2023-06-15 NOTE — Progress Notes (Unsigned)
Medication list updated per Dr. Teresa Coombs written orders, copy scanned to chart.

## 2023-06-16 ENCOUNTER — Other Ambulatory Visit: Payer: Self-pay

## 2023-06-16 ENCOUNTER — Telehealth: Payer: Self-pay

## 2023-06-16 DIAGNOSIS — G40319 Generalized idiopathic epilepsy and epileptic syndromes, intractable, without status epilepticus: Secondary | ICD-10-CM

## 2023-06-16 DIAGNOSIS — G40119 Localization-related (focal) (partial) symptomatic epilepsy and epileptic syndromes with simple partial seizures, intractable, without status epilepticus: Secondary | ICD-10-CM

## 2023-06-16 MED ORDER — ONFI 10 MG PO TABS
ORAL_TABLET | ORAL | 3 refills | Status: DC
Start: 2023-06-16 — End: 2023-10-19

## 2023-06-16 MED ORDER — CARBAMAZEPINE 100 MG PO CHEW
CHEWABLE_TABLET | ORAL | Status: DC
Start: 2023-06-16 — End: 2023-06-16

## 2023-06-16 MED ORDER — CARBAMAZEPINE 100 MG PO CHEW
CHEWABLE_TABLET | ORAL | 3 refills | Status: DC
Start: 2023-06-16 — End: 2023-10-30

## 2023-06-16 MED ORDER — ONFI 10 MG PO TABS
ORAL_TABLET | ORAL | Status: DC
Start: 2023-06-16 — End: 2023-06-16

## 2023-06-16 NOTE — Progress Notes (Signed)
Dispensed Days Supply Quantity Provider Pharmacy  [name] 10 MG TABLET 05/04/2023 30 120 each Almira Coaster, MD [facility] [phone] - G...  [name] 10 MG TABLET 04/03/2023 30 120 each Almira Coaster, MD [facility] [phone] - G...  [name] 10 MG TABLET 03/06/2023 30 120 each Almira Coaster, MD [facility] [phone] - G...  [name] 10 MG TABLET 02/01/2023 30 120 each Almira Coaster, MD [facility] [phone] - G...  CLOBAZAM 10 MG TABLET 01/05/2023 30 120 each Almira Coaster, MD [facility] [phone] - G...  [name] 10 MG TABLET 11/30/2022 34 136 each Almira Coaster, MD [facility] [phone] - G...  [name] 10 MG TABLET 10/27/2022 34 8 each Almira Coaster, MD [facility] [phone] - G...  [name] 10 MG TABLET 09/23/2022 34 136 each Almira Coaster, MD [facility] [phone] - G...  [name] 10 MG TABLET 08/18/2022 34 136 each Almira Coaster, MD [facility] [phone] - G...  [name] 10 MG TABLET 07/14/2022 34 136 each Almira Coaster, MD [facility] [phone] - G...    Last visit 04/08/23 Next visit 10/15/23

## 2023-06-16 NOTE — Telephone Encounter (Signed)
[facility], spoke with Ethelene Browns, advised to discontinue all active scripts in system for [name] and Tregetol that weren't matching current medication list which was reviewed. Ethelene Browns stated that [facility] would [facility].

## 2023-06-22 ENCOUNTER — Telehealth: Payer: Self-pay | Admitting: Neurology

## 2023-06-22 NOTE — Telephone Encounter (Signed)
Pt's mother reports that [facility](fax#[phone]) informed her they are needing clinical notes and certificate of medical necessity.  Pt's mother states this is so the DME can be paid by Medicaid, please respond

## 2023-06-22 NOTE — Telephone Encounter (Signed)
There's a visit from 01/2023. I have faxed it to [facility] and sent them a community message.

## 2023-07-21 ENCOUNTER — Other Ambulatory Visit: Payer: Self-pay | Admitting: Neurology

## 2023-07-21 ENCOUNTER — Other Ambulatory Visit: Payer: Self-pay

## 2023-07-21 DIAGNOSIS — G40119 Localization-related (focal) (partial) symptomatic epilepsy and epileptic syndromes with simple partial seizures, intractable, without status epilepticus: Secondary | ICD-10-CM

## 2023-07-21 DIAGNOSIS — G40319 Generalized idiopathic epilepsy and epileptic syndromes, intractable, without status epilepticus: Secondary | ICD-10-CM

## 2023-07-21 MED ORDER — VIMPAT 100 MG PO TABS
ORAL_TABLET | ORAL | 1 refills | Status: DC
Start: 2023-07-21 — End: 2023-12-08

## 2023-07-21 MED ORDER — VIMPAT 100 MG PO TABS
ORAL_TABLET | ORAL | 1 refills | Status: DC
Start: 2023-07-21 — End: 2023-07-21

## 2023-07-21 NOTE — Telephone Encounter (Signed)
Requested Prescriptions   Pending Prescriptions Disp Refills   VIMPAT 100 MG TABS 270 tablet 1    Sig: TAKE 1 TABLET IN THE MORNING AND TAKE 2 TABLETS AT NIGHT   Last seen 04/08/23 Next appt 10/15/23 Dispenses  Up to date information could not be retrieved.  Source Status Last Checked for Updates  DrFirst (Ambulatory) Error Occurred 07/21/2023 11:26 AM   Dispensed Days Supply Quantity Provider Pharmacy  VIMPAT 100 MG TABLET 06/06/2023 20 60 each Marnee Spring, PA-C [facility] [phone] - G...  VIMPAT 100 MG TABLET 05/15/2023 20 60 each Marnee Spring, PA-C [facility] [phone] - G...  VIMPAT 100 MG TABLET 04/25/2023 20 60 each Marnee Spring, PA-C [facility] [phone] - G...  VIMPAT 100 MG TABLET 01/21/2023 90 270 each Marnee Spring, PA-C [facility] [phone] - G...  VIMPAT 100 MG TABLET 10/22/2022 90 270 each Almira Coaster, MD [facility] [phone] - G...  VIMPAT 100 MG TABLET 07/21/2022 90 270 each Almira Coaster, MD [facility] [phone] - G.Marland KitchenMarland Kitchen

## 2023-07-22 NOTE — Telephone Encounter (Signed)
Printed please resend I chose normal send to pharmacy.  Thanks,  Production assistant, radio

## 2023-07-22 NOTE — Addendum Note (Signed)
Addended by: Danne Harbor on: 07/22/2023 08:04 AM   Modules accepted: Orders

## 2023-07-22 NOTE — Telephone Encounter (Signed)
Done

## 2023-10-12 ENCOUNTER — Other Ambulatory Visit (INDEPENDENT_AMBULATORY_CARE_PROVIDER_SITE_OTHER): Payer: Self-pay | Admitting: Family

## 2023-10-12 DIAGNOSIS — E876 Hypokalemia: Secondary | ICD-10-CM

## 2023-10-12 NOTE — Telephone Encounter (Signed)
This should go to Dr Teresa Coombs at [facility]. TG

## 2023-10-15 ENCOUNTER — Ambulatory Visit: Payer: MEDICAID | Admitting: Neurology

## 2023-10-19 ENCOUNTER — Other Ambulatory Visit (INDEPENDENT_AMBULATORY_CARE_PROVIDER_SITE_OTHER): Payer: Self-pay | Admitting: Neurology

## 2023-10-19 ENCOUNTER — Other Ambulatory Visit: Payer: Self-pay | Admitting: Neurology

## 2023-10-19 DIAGNOSIS — E876 Hypokalemia: Secondary | ICD-10-CM

## 2023-10-19 DIAGNOSIS — G40119 Localization-related (focal) (partial) symptomatic epilepsy and epileptic syndromes with simple partial seizures, intractable, without status epilepticus: Secondary | ICD-10-CM

## 2023-10-19 DIAGNOSIS — G40319 Generalized idiopathic epilepsy and epileptic syndromes, intractable, without status epilepticus: Secondary | ICD-10-CM

## 2023-10-19 MED ORDER — ONFI 10 MG PO TABS: ORAL_TABLET | ORAL | 3 refills | Status: DC

## 2023-10-19 NOTE — Telephone Encounter (Signed)
Last seen 04/08/23, next appt 01/25/24 Dispenses   Dispensed Days Supply Quantity Provider Pharmacy  [name] 10 MG TABLET 09/19/2023 30 90 each Windell Norfolk, MD [facility] [phone] - G...  [name] 10 MG TABLET 08/22/2023 30 90 each Windell Norfolk, MD [facility] [phone] - G...  [name] 10 MG TABLET 07/20/2023 30 90 each Windell Norfolk, MD [facility] [phone] - G...  [name] 10 MG TABLET 06/16/2023 30 90 each Windell Norfolk, MD [facility] [phone] - G...  [name] 10 MG TABLET 05/04/2023 30 120 each Almira Coaster, MD [facility] [phone] - G...  [name] 10 MG TABLET 04/03/2023 30 120 each Almira Coaster, MD [facility] [phone] - G...  [name] 10 MG TABLET 03/06/2023 30 120 each Almira Coaster, MD [facility] [phone] - G...  [name] 10 MG TABLET 02/01/2023 30 120 each Almira Coaster, MD [facility] [phone] - G...  CLOBAZAM 10 MG TABLET 01/05/2023 30 120 each Almira Coaster, MD [facility] [phone] - G...  [name] 10 MG TABLET 11/30/2022 34 136 each Almira Coaster, MD [facility] [phone] - G...  [name] 10 MG TABLET 10/27/2022 34 8 each Almira Coaster, MD [facility] [phone] - G.Marland KitchenMarland Kitchen

## 2023-10-19 NOTE — Telephone Encounter (Signed)
Running this by provider as [facility] was being filled elsewhere

## 2023-10-19 NOTE — Telephone Encounter (Signed)
Pt's appointment has been r/s, pt on wait list, pt's mother is asking the [name] 10 MG tablet  [facility] [phone]

## 2023-10-29 ENCOUNTER — Other Ambulatory Visit: Payer: Self-pay | Admitting: Neurology

## 2023-10-29 DIAGNOSIS — E876 Hypokalemia: Secondary | ICD-10-CM

## 2023-10-29 DIAGNOSIS — G40319 Generalized idiopathic epilepsy and epileptic syndromes, intractable, without status epilepticus: Secondary | ICD-10-CM

## 2023-10-29 DIAGNOSIS — G40119 Localization-related (focal) (partial) symptomatic epilepsy and epileptic syndromes with simple partial seizures, intractable, without status epilepticus: Secondary | ICD-10-CM

## 2023-11-10 ENCOUNTER — Telehealth: Payer: Self-pay | Admitting: Neurology

## 2023-11-10 ENCOUNTER — Telehealth: Payer: Self-pay

## 2023-11-10 ENCOUNTER — Other Ambulatory Visit (HOSPITAL_COMMUNITY): Payer: Self-pay

## 2023-11-10 NOTE — Telephone Encounter (Signed)
Pharmacy Patient Advocate Encounter   Received notification from Physician's Office that prior authorization for Trokendi XR 50MG  Cp24 is required/requested.   Insurance verification completed.   The patient is insured through [employer] .   Per test claim: The current 30 day co-pay is, $0.  No PA needed at this time. This test claim was processed through [facility]- copay amounts may vary at other pharmacies due to pharmacy/plan contracts, or as the patient moves through the different stages of their insurance plan.

## 2023-11-10 NOTE — Telephone Encounter (Signed)
Pt's mother, Guled Gahan said pharmacy request a new prescription and PA for TROKENDI XR 50 MG CP24. Need refilled today in case bad weather he only a dose for tonight. [facility] [phone]

## 2023-11-10 NOTE — Telephone Encounter (Signed)
[facility] She states new PA needed for trokendi 50 mg tabs. Sent to PA urgent requests. Irving Burton states a 7 day supply cash price is $47.23 and 5 day supply cash price is $40.02  Call to mother. She is aware that PA being sent urgently and aware of cash prices and in agreement to pick up.

## 2023-11-17 ENCOUNTER — Other Ambulatory Visit: Payer: Self-pay

## 2023-11-17 DIAGNOSIS — G40119 Localization-related (focal) (partial) symptomatic epilepsy and epileptic syndromes with simple partial seizures, intractable, without status epilepticus: Secondary | ICD-10-CM

## 2023-11-17 DIAGNOSIS — G40319 Generalized idiopathic epilepsy and epileptic syndromes, intractable, without status epilepticus: Secondary | ICD-10-CM

## 2023-11-17 MED ORDER — TROKENDI XR 50 MG PO CP24: ORAL_CAPSULE | ORAL | 5 refills | Status: DC

## 2023-11-17 NOTE — Telephone Encounter (Signed)
 [facility] she will fill the trokendi 50 mg. Not topiramate ER.  I called mother of pt.  I relayed that this prescription was done today at 10:01am. TROKENDI XR 50 MG CP24 31 capsule 5 11/17/2023 --   Sig: Take 1 capsule at nighttime   Sent to pharmacy as: TROKENDI XR 50 MG Capsule SR 24 hr   E-Prescribing Status: Receipt confirmed by pharmacy (11/17/2023 10:01 AM EST)  No PA needed.  Per note that I see.          11/10/23  1:54 PM Note Pharmacy Patient Advocate Encounter   Received notification from Physician's Office that prior authorization for Trokendi XR 50MG  Cp24 is required/requested.   Insurance verification completed.   The patient is insured through [employer] [place] .   Per test claim: The current 30 day co-pay is, $0.  No PA needed at this time. This test claim was processed through [facility]- copay amounts may vary at other pharmacies due to pharmacy/plan contracts, or as the patient moves through the different stages of their insurance plan.   So mother should be able to pick up prescription. She is to call back if issues.

## 2023-11-17 NOTE — Telephone Encounter (Signed)
 Pt's mother is reporting [facility] the [facility] has not gone [facility], she is being told it went thru the [facility] but she has only [facility] [address] . Pt's mother is asking for a call to discuss this so this can be resolved

## 2023-11-17 NOTE — Progress Notes (Signed)
 Rx sent

## 2023-11-17 NOTE — Telephone Encounter (Signed)
 PA was done for [facility], when it was supposed to be done for [facility] [address]. Please do PA for [facility] [address].

## 2023-11-18 ENCOUNTER — Telehealth: Payer: Self-pay

## 2023-11-18 ENCOUNTER — Other Ambulatory Visit (HOSPITAL_COMMUNITY): Payer: Self-pay

## 2023-11-18 NOTE — Telephone Encounter (Signed)
 Pharmacy Patient Advocate Encounter   Received notification from Pt Calls Messages that prior authorization for TROKENDI XR 50 MG CP24  is required/requested.   Insurance verification completed.   The patient is insured through [facility] .   Per test claim: The current 30 day co-pay is, $0.00**.  No PA needed at this time. This test claim was processed through [facility]- copay amounts may vary at other pharmacies due to pharmacy/plan contracts, or as the patient moves through the different stages of their insurance plan.     **Must be run as Counsellor. DAW-1 required by pharmacy to process.

## 2023-11-18 NOTE — Telephone Encounter (Signed)
 No PA required. [facility]. Insurance pays for [employer] versus generic. Tech stated to make sure that DAW-1 is on script when sending in future fills. Co-pay is $0.0

## 2023-11-19 NOTE — Telephone Encounter (Signed)
 Make Sure DAW-1 is on Scripts from now on

## 2023-11-23 ENCOUNTER — Other Ambulatory Visit (INDEPENDENT_AMBULATORY_CARE_PROVIDER_SITE_OTHER): Payer: Self-pay | Admitting: Neurology

## 2023-11-23 DIAGNOSIS — E876 Hypokalemia: Secondary | ICD-10-CM

## 2023-11-30 NOTE — Telephone Encounter (Signed)
 They will have to get it from PCP

## 2023-12-08 ENCOUNTER — Ambulatory Visit (INDEPENDENT_AMBULATORY_CARE_PROVIDER_SITE_OTHER): Payer: MEDICAID | Admitting: Neurology

## 2023-12-08 ENCOUNTER — Encounter: Payer: Self-pay | Admitting: Neurology

## 2023-12-08 VITALS — BP 104/63 | Ht 62.0 in | Wt 131.0 lb

## 2023-12-08 DIAGNOSIS — G4733 Obstructive sleep apnea (adult) (pediatric): Secondary | ICD-10-CM | POA: Diagnosis not present

## 2023-12-08 DIAGNOSIS — G40319 Generalized idiopathic epilepsy and epileptic syndromes, intractable, without status epilepticus: Secondary | ICD-10-CM

## 2023-12-08 DIAGNOSIS — Z9689 Presence of other specified functional implants: Secondary | ICD-10-CM

## 2023-12-08 DIAGNOSIS — G40119 Localization-related (focal) (partial) symptomatic epilepsy and epileptic syndromes with simple partial seizures, intractable, without status epilepticus: Secondary | ICD-10-CM

## 2023-12-08 MED ORDER — CARBAMAZEPINE 100 MG PO CHEW: 200.0000 mg | CHEWABLE_TABLET | Freq: Three times a day (TID) | ORAL | 3 refills | Status: DC

## 2023-12-08 MED ORDER — VIMPAT 100 MG PO TABS
ORAL_TABLET | ORAL | 5 refills | Status: DC
Start: 2023-12-08 — End: 2024-07-01

## 2023-12-08 NOTE — Patient Instructions (Addendum)
 Increase carbamazepine to 200 mg 3 times daily Continue levetiracetam 1500 mg twice daily Continue clobazam 10 mg in the morning 20 mg at night Continue with Trokendi 250 mg at night. Continue with Vimpat 100 mg in the morning and 200 mg at night. Continue to monitor side effect  Follow up in 6 months or sooner if worse   Additional medications to consider in the future include: Cenobamate

## 2023-12-08 NOTE — Progress Notes (Unsigned)
 [facility]  PATIENT: [name] DOB: 1986-07-27  REQUESTING CLINICIAN: Eartha Inch, MD HISTORY FROM: Mother, sister and chart review  REASON FOR VISIT: Transitioning from peds to adult. Epilepsy management.    HISTORICAL  CHIEF COMPLAINT:  Chief Complaint  Patient presents with   Follow-up    Pt in 12, here with mother Tyler Aas, and sister [name]  Pt is here for seizure follow up. Pt's mother states every 2-3 weeks pt has seizures and sometimes come in clusters. Pt's mother states if a change in medication might be able to help with pt's seizures. Needs refills on Vimpat.    INTERVAL HISTORY 12/08/2023:  [name] presents today for follow-up, he is accompanied by mother and sister.  Last visit was in July, at that time we continued current medications.  They tells me that he continues to have seizures, 1 every 2 to 3 weeks.  Family reports the seizures are mainly related to sleep, whenever falling asleep or waking up from sleep.  He is compliant with his medications, denies any side effect. They are asking for a prescription for his O2 supplement sent to [facility] (Prescription previously sent by Dr. Sharene Skeans)    INTERVAL HISTORY 04/08/2023:  Earmon Phoenix presents for follow-up, he is accompanied by mother and father.  Last visit was in May at that time we started him on Epidiolex but it caused side effects of dizziness, gait instability, and somnolence.  He could not walk.  Due to these side effects, Epidiolex was discontinued.  While on Epidiolex he continued to have seizures 2 to 3/month.  His seizure frequency has not changed again 2 to 3/month.  He is compliant with his medications, does not swipe his VNS multiple times per day.  Family do not have any major concerns currently.   HISTORY OF PRESENT ILLNESS:  This is a 38 year old gentleman with multiple medical conditions including intellectual disability, intractable epilepsy status post  VNS placement, hypothyroidism, hormone deficiency, obstructive sleep apnea on CPAP who is presenting to establish care.  Patient was initially previously managed by pediatric neurology and Dr. Zara Council at [facility] but now he needs a local adult neurologist.   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  6 months later he started having his first seizure.  Since then his seizures have been intractable.  He had multiple type of  seizures including tonic seizures, bilateral tonic-clonic seizures, focal sensory seizures where he is aware, atonic seizure where he will fall and staring seizures.  He has tried multiple medication listed below.   Currently he is on the combination on 5 antiseizure medications including Tegretol 150 mg 3 times daily, clobazam 10/10 and 20 mg at bedtime, Vimpat 100 mg in the morning 200 at night, Keppra 1000 mg in the morning and 1250 in the evening and Trokendi 250 at bedtime.  Family reports that his seizures remain intractable despite VNS therapy.  He continued to have 2-3 seizures a month.  He was admitted last in the hospital in February for seizures and his last seizure was on May 13.  Family has reported prolonged seizure where he will need CPR, he also had status where he was intubated but currently they feel like the seizures are stable not getting worse even though he is getting 2 to 3/month.  Denies any history of birth trauma and he did not stay in the ICU as  a baby. They could not related any seizure risk factors.    Handedness: Right   Onset: At the age of 7  Seizure Type: Tonic seizures, bilateral tonic clonic seizures, atonic seizures (drop seizure), focal sensory seizures and staring seizures  Current frequency: Last seizures 5/13, 2 to 3 seizures a months   Any injuries from seizures: Fall, concussion   Seizure risk factors: None reported   Previous ASMs: Gabapentin, Tegretol, Lamotrigine,  Felbatol, Depakote, Topamax, Keppra, [name], Phenobarbital, Lacosamide, Phenytoin  Currenty ASMs: Tegretol 150 mg TID, Clobazam 07/02/19 mg, Vimpat 100/200 mg, Keppra 1000/1250 mg, Trokendi 250 mg qHs   ASMs side effects: Allergic reaction to Phenytoin and Dilantin   Brain Images: No intracranial pathology. Last CT with subdural collection   Previous EEGs: Diffuse slow, focal left slowing   OTHER MEDICAL CONDITIONS: Intellectual disability, Intractable seizures s/p VNS placement, OSA on CPAP, Hypothyroidism   REVIEW OF SYSTEMS: Full 14 system review of systems performed and negative with exception of: As noted in the HPI   ALLERGIES: Allergies  Allergen Reactions   Antihistamines, Chlorpheniramine-Type Other (See Comments)    Other Reaction: Dimetapp causes seizures  Allergen- Dimetapp, Reaction - seizures   Divalproex Sodium Other (See Comments)    Nausea, vomiting, liver and kidney dysfunction   Phenytoin Rash and Other (See Comments)    Dilantin causes more seizures.    Valproic Acid And Related Other (See Comments)    Shuts down systems   Vancomycin Other (See Comments), Rash and Swelling    If given too fast, causes red man reaction  Other Reaction: Redman's Syndrome  Other Reaction(s): Angioedema   Antihistamines, Diphenhydramine-Type Other (See Comments)    seizures   Phenytoin Sodium Extended Swelling    Enhances seizures  Other Reaction(s): Angioedema   Valproic Acid Other (See Comments)    Shuts down systems  Other Reaction(s): Angioedema  Starts to shut down body functions   Divalproex Sodium Rash    Nausea, vomiting, liver and kidney dysfunction    HOME MEDICATIONS: Outpatient Medications Prior to Visit  Medication Sig Dispense Refill   albuterol (ACCUNEB) 1.25 MG/3ML nebulizer solution Take 1 ampule by nebulization every 4 (four) hours as needed for wheezing.     Calcium-Vitamin D-Vitamin K (VIACTIV CALCIUM PLUS D) 650-12.5-40 MG-MCG-MCG CHEW Chew 1  each by mouth daily.     cetirizine (ZYRTEC) 10 MG tablet Take 10 mg by mouth daily.     Flaxseed, Linseed, (FLAXSEED OIL) 1000 MG CAPS Take 1,000 mg by mouth daily.     K-PHOS 500 MG tablet TAKE 1 TABLET BY MOUTH FOUR TIMES A DAY 124 tablet 5   KLOR-CON M20 20 MEQ tablet TAKE 1 TABLET BY MOUTH EVERY DAY 90 tablet 2   levETIRAcetam (KEPPRA) 500 MG tablet Take 3 tablets (1,500 mg total) by mouth in the morning and at bedtime. 540 tablet 3   levothyroxine (SYNTHROID) 75 MCG tablet Take 75 mcg by mouth daily before breakfast.     LORazepam (ATIVAN) 2 MG/ML injection Inject 1 mL (2 mg total) into the vein See admin instructions. Dilute 2mg  of lorazepam with 1mL of saline.  Administer IV push over 3-5 minutes as needed for seizures. 1 mL 5   montelukast (SINGULAIR) 10 MG tablet Take 10 mg by mouth at bedtime.  6   Multiple Vitamin (MULTIVITAMIN WITH MINERALS) TABS Take 1 tablet by mouth daily.     Omega-3 Fatty Acids (FISH OIL) 500 MG CAPS Take 500 mg by mouth daily.  [name] 10 MG tablet TAKE 1 TABLET BY MOUTH IN THE MORNING, AND 2 AT BEDTIME 90 tablet 3   PHENObarbital (LUMINAL) 65 MG/ML injection Withdraw 1ml (65mg ) Phenobarbital and 4ml Normal Saline into 5ml syringe. Adminster IV push for 3-5 minutes as needed for recurrent seizures 5 mL 5   promethazine (PHENERGAN) 25 MG suppository Place 25 mg rectally every 6 (six) hours as needed for nausea or vomiting.     rosuvastatin (CRESTOR) 10 MG tablet Take 10 mg by mouth at bedtime.  5   Topiramate ER (TROKENDI XR) 200 MG CP24 TAKE 1 CAPSULE BY MOUTH AT BEDTIME. 90 capsule 3   TROKENDI XR 50 MG CP24 Take 1 capsule at nighttime 31 capsule 5   carbamazepine (TEGRETOL) 100 MG chewable tablet CHEW 1 &1/2 TABLETS 3 TIMES A DAY 90 tablet 3   levothyroxine (SYNTHROID) 50 MCG tablet Take 50 mcg by mouth daily before breakfast.     VIMPAT 100 MG TABS TAKE 1 TABLET IN THE MORNING AND TAKE 2 TABLETS AT NIGHT 270 tablet 1   No facility-administered medications  prior to visit.    PAST MEDICAL HISTORY: Past Medical History:  Diagnosis Date   Afib (HCC)    only per portocath removal.   ALLERGIC RHINITIS 12/26/2009   Atrial fibrillation (HCC) 04/08/2012   Formatting of this note might be different from the original.  Last Assessment & Plan:   One episode during a procedure in July. Likely induced by the wire. No recurrence. He is in normal sinus rhythm today. No further workup or treatment.   Overview:   Paroxysmal - occurred during a procedure - has been anticoagulated with Coumadin but initiated due to DVT  Formatting of this note might be differ   Blood clot in vein 2015   Brown's syndrome    Complication of anesthesia    seizure after pac surgery at [facility] 2013 had to be intubated, no other anes issurs   Growth disorder    Headache    migraines   Heart murmur    moderate MR ses dr Jamesetta So   Hyperlipidemia    No therapy   Hypokalemia    Mental retardation, mild (I.Q. 50-70)    MITRAL VALVE PROLAPSE 06/22/2007   SEIZURE DISORDER 06/22/2007   last seizure 08-02-2019   Sleep apnea    cpap , last sleep study 10/01/11   Unspecified hypothyroidism 06/22/2007    PAST SURGICAL HISTORY: Past Surgical History:  Procedure Laterality Date   ANKLE SURGERY Left    fused does not bend, wears right leg brace 2018 at [facility]   BIOPSY  08/22/2019   Procedure: BIOPSY;  Surgeon: Napoleon Form, MD;  Location: [facility];  Service: Endoscopy;;   BIOPSY  10/02/2022   Procedure: BIOPSY;  Surgeon: Napoleon Form, MD;  Location: [facility];  Service: Gastroenterology;;   CATARACT EXTRACTION     2021   COLONOSCOPY WITH PROPOFOL N/A 08/22/2019   Procedure: COLONOSCOPY WITH PROPOFOL;  Surgeon: Napoleon Form, MD;  Location: [facility];  Service: Endoscopy;  Laterality: N/A;   COLONOSCOPY WITH PROPOFOL N/A 10/02/2022   Procedure: COLONOSCOPY WITH PROPOFOL;  Surgeon: Napoleon Form, MD;  Location: [facility];  Service:  Gastroenterology;  Laterality: N/A;   EYE SURGERY     X2 for Brown Syndrome   HEMOSTASIS CLIP PLACEMENT  08/22/2019   Procedure: HEMOSTASIS CLIP PLACEMENT;  Surgeon: Napoleon Form, MD;  Location: [facility];  Service: Endoscopy;;   IMPLANTATION VAGAL NERVE STIMULATOR  Left leaves on for surgeries, new battery 2022, first put in 2005.   POLYPECTOMY  08/22/2019   Procedure: POLYPECTOMY;  Surgeon: Napoleon Form, MD;  Location: [facility];  Service: Endoscopy;;   PORTACATH PLACEMENT  2013 and removed and new placed   right chest    FAMILY HISTORY: Family History  Problem Relation Age of Onset   Breast cancer Mother    Colon polyps Mother    Clotting disorder Mother    Diabetes Father    Hypertension Father    Colon polyps Father    Hyperlipidemia Father    Atrial fibrillation Father    Diabetes Maternal Grandmother    Heart disease Maternal Grandmother    Diabetes Maternal Grandfather    Heart disease Maternal Grandfather    Coronary artery disease Maternal Grandfather    Congestive Heart Failure Maternal Grandfather        Died at 30   Pneumonia Maternal Grandfather    Diabetes Paternal Grandmother    Heart disease Paternal Grandmother    Pneumonia Paternal Grandmother        Died at 59   Stroke Paternal Grandmother    Diabetes Paternal Grandfather    Heart disease Paternal Grandfather    Coronary artery disease Paternal Grandfather    Lung cancer Paternal Grandfather        Died at 26    SOCIAL HISTORY: Social History   Socioeconomic History   Marital status: Single    Spouse name: Not on file   Number of children: 0   Years of education: Not on file   Highest education level: Not on file  Occupational History   Occupation: Disabled    Employer: UNEMPLOYED  Tobacco Use   Smoking status: Never    Passive exposure: Never   Smokeless tobacco: Never  Vaping Use   Vaping status: Never Used  Substance and Sexual Activity   Alcohol use: No     Alcohol/week: 0.0 standard drinks of alcohol   Drug use: No   Sexual activity: Never  Other Topics Concern   Not on file  Social History Narrative   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 Disabled.    He lives with his parents.    He enjoys helping around the house, Nascar, and football.   Caffiene soda 's with caffeien 2-3 / day   Social Drivers of Health   Financial Resource Strain: Low Risk  (11/25/2022)   Received from [facility], [facility]   Overall Financial Resource Strain (CARDIA)    Difficulty of Paying Living Expenses: Not hard at all  Food Insecurity: No Food Insecurity (11/25/2022)   Received from [facility], [facility]   Hunger Vital Sign    Worried About Running Out of Food in the Last Year: Never true    Ran Out of Food in the Last Year: Never true  Transportation Needs: No Transportation Needs (11/25/2022)   Received from [facility], [facility]   PRAPARE - Transportation    Lack of Transportation (Medical): No    Lack of Transportation (Non-Medical): No  Physical Activity: Not on file  Stress: Not on file  Social Connections: Unknown (01/21/2022)   Received from [facility], [facility]   Social Network    Social Network: Not on file  Intimate Partner Violence: Patient Declined (06/01/2022)   Humiliation, Afraid, Rape, and Kick questionnaire    Fear of Current  or Ex-Partner: Patient declined    Emotionally Abused: Patient declined    Physically Abused: Patient declined    Sexually Abused: Patient declined    PHYSICAL EXAM  GENERAL EXAM/CONSTITUTIONAL: Vitals:  Vitals:   12/08/23 1141  BP: 104/63  Weight: 131 lb (59.4 kg)  Height: 5\' 2"  (1.575 m)   Body mass index is 23.96 kg/m. Wt Readings from Last 3 Encounters:  12/08/23 131 lb (59.4 kg)  04/08/23 130 lb (59 kg)  02/24/23 127 lb (57.6 kg)   Patient is in no distress; well developed, nourished and groomed; neck is  supple  MUSCULOSKELETAL: Gait, strength, tone, movements noted in Neurologic exam below  NEUROLOGIC: MENTAL STATUS:      No data to display         awake, alert, oriented to person Answering appropriately Able to follow commands and direction  Able to track examiner  Normal bulk and tone, he does have contraction  Sensory normal to light touch Clumsy gait and poor balance, he does need assistance with ambulation     DIAGNOSTIC DATA (LABS, IMAGING, TESTING) - I reviewed patient records, labs, notes, testing and imaging myself where available.  Lab Results  Component Value Date   WBC 3.4 (L) 11/20/2022   HGB 12.8 (L) 11/20/2022   HCT 37.8 (L) 11/20/2022   MCV 84.8 11/20/2022   PLT 233 11/20/2022      Component Value Date/Time   NA 144 02/04/2023 0929   K 4.4 02/04/2023 0929   CL 109 (H) 02/04/2023 0929   CO2 22 02/04/2023 0929   GLUCOSE 96 02/04/2023 0929   GLUCOSE 102 (H) 11/21/2022 0658   BUN 15 02/04/2023 0929   CREATININE 0.92 02/04/2023 0929   CALCIUM 9.4 02/04/2023 0929   PROT 7.4 02/04/2023 0929   ALBUMIN 4.4 02/04/2023 0929   AST 19 02/04/2023 0929   ALT 26 02/04/2023 0929   ALKPHOS 97 02/04/2023 0929   BILITOT <0.2 02/04/2023 0929   GFRNONAA >60 11/21/2022 0658   GFRAA >60 12/06/2019 1158   Lab Results  Component Value Date   TRIG 187 (H) 02/13/2018   No results found for: "HGBA1C" No results found for: "VITAMINB12" Lab Results  Component Value Date   TSH 0.189 (L) 05/13/2014    CT Head 11/19/2022 1. 4 mm thick hypodense subdural collection over the left cerebral convexity without mass effect or midline shift is likely subacute to chronic. No evidence of acute blood products. 2. Otherwise, no evidence of acute intracranial pathology. 3. Unchanged dense basal ganglia calcifications   EEG 11/20/22: - Intermittent slow, generalized and maximal left temporal    ASSESSMENT AND PLAN  38 y.o. 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  He continues to have 2-3 seizures per month. Plan will be to increase his Carbamazepine to 200 mg three time daily, continue with Levetiracetam to 1500 mg twice daily, continue with Trokendi 250 mg at night and Vimpat 100 mg in the morning and 200 mg at night.   Advised family to contact me for any seizure or seizure activity.  Will contact the pediatric neurology team regarding the O2 supplementation prescription.    1. 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 Partial epilepsy with intractable epilepsy (HCC)   3. S/P placement of VNS (vagus nerve stimulation) device  4. Severe obstructive sleep apnea      Patient Instructions  Increase carbamazepine to 200 mg 3 times daily Continue levetiracetam 1500 mg twice daily Continue clobazam 10 mg in the morning 20 mg at night Continue with Trokendi 250 mg at night. Continue with Vimpat 100 mg in the morning and 200 mg at night. Continue to monitor side effect  Follow up in 6 months or sooner if worse   Additional medications to consider in the future include: Cenobamate   Per [facility] statutes, patients with seizures are not allowed to drive until they have been seizure-free for six months.  Other recommendations include using caution when using heavy equipment or power tools. Avoid working on ladders or at heights. Take showers instead of baths.  Do not swim alone.  Ensure the water temperature is not too high on the home water heater. Do not go swimming alone. Do not lock yourself in a room alone (i.e. bathroom). When caring for infants or small children, sit down when holding, feeding, or changing them to minimize risk of injury to the child in the event you have a seizure. Maintain good sleep  hygiene. Avoid alcohol.  Also recommend adequate sleep, hydration, good diet and minimize stress.   During the Seizure  - First, ensure adequate ventilation and place patients on the floor on their left side  Loosen clothing around the neck and ensure the airway is patent. If the patient is clenching the teeth, do not force the mouth open with any object as this can cause severe damage - Remove all items from the surrounding that can be hazardous. The patient may be oblivious to what's happening and may not even know what he or she is doing. If the patient is confused and wandering, either gently guide him/her away and block access to outside areas - Reassure the individual and be comforting - Call 911. In most cases, the seizure ends before EMS arrives. However, there are cases when seizures may last over 3 to 5 minutes. Or the individual may have developed breathing difficulties or severe injuries. If a pregnant patient or a person with diabetes develops a seizure, it is prudent to call an ambulance. - Finally, if the patient does not regain full consciousness, then call EMS. Most patients will remain confused for about 45 to 90 minutes after a seizure, so you must use judgment in calling for help. - Avoid restraints but make sure the patient is in a bed with padded side rails - Place the individual in a lateral position with the neck slightly flexed; this will help the saliva drain from the mouth and prevent the tongue from falling backward - Remove all nearby furniture and other hazards from the area - Provide verbal assurance as the individual is regaining consciousness - Provide the patient with privacy if possible - Call for help and start treatment as ordered by the caregiver   After the Seizure (Postictal Stage)  After a seizure, most patients experience confusion, fatigue, muscle pain and/or a headache. Thus, one should permit the individual to sleep. For the next few days, reassurance  is essential. Being calm and helping reorient the person is also of importance.  Most seizures are painless and end spontaneously. Seizures are not harmful to others but can lead to complications such as stress on the lungs, brain and the heart. Individuals with prior lung problems may develop labored breathing and respiratory distress.     No orders of the defined  types were placed in this encounter.   Meds ordered this encounter  Medications   carbamazepine (TEGRETOL) 100 MG chewable tablet    Sig: Chew 2 tablets (200 mg total) by mouth 3 (three) times daily.    Dispense:  540 tablet    Refill:  3   VIMPAT 100 MG TABS    Sig: TAKE 1 TABLET IN THE MORNING AND TAKE 2 TABLETS AT NIGHT    Dispense:  270 tablet    Refill:  5    Brand Name Medically Necessary    Return in about 6 months (around 06/09/2024).    Windell Norfolk, MD 12/08/2023, 3:29 PM  [facility] [address] [place], [place] [address] [phone]

## 2023-12-17 ENCOUNTER — Telehealth: Payer: Self-pay

## 2023-12-17 ENCOUNTER — Other Ambulatory Visit: Payer: Self-pay | Admitting: Neurology

## 2023-12-17 ENCOUNTER — Encounter: Payer: Self-pay | Admitting: Neurology

## 2023-12-17 MED ORDER — ONDANSETRON 4 MG PO TBDP: 4.0000 mg | ORAL_TABLET | Freq: Three times a day (TID) | ORAL | 0 refills | Status: AC | PRN

## 2023-12-17 NOTE — Telephone Encounter (Signed)
 I will give him on Zofran for the vomiting. Lets continue to watch him. Please increase water intake to at least 36-48 oz per day. If dizziness does not resolve by next week, we will obtain labs.

## 2023-12-17 NOTE — Telephone Encounter (Signed)
 Call to patient and advised that Dr. Teresa Coombs sent in Zofran for the vomiting and monitor him over the next few days and if the dizziness doesn't resolve let us know and we can draw labs. Advised  make sure he is getting between 36-48 oz of water daily. No changes to Carbamazepine at this time.   Mom verbalized understanding

## 2023-12-21 ENCOUNTER — Other Ambulatory Visit: Payer: Self-pay | Admitting: Neurology

## 2023-12-21 ENCOUNTER — Telehealth: Payer: Self-pay

## 2023-12-21 DIAGNOSIS — G40909 Epilepsy, unspecified, not intractable, without status epilepticus: Secondary | ICD-10-CM

## 2023-12-21 DIAGNOSIS — Z5181 Encounter for therapeutic drug level monitoring: Secondary | ICD-10-CM

## 2023-12-21 DIAGNOSIS — G40813 Lennox-Gastaut syndrome, intractable, with status epilepticus: Secondary | ICD-10-CM

## 2023-12-21 DIAGNOSIS — G40319 Generalized idiopathic epilepsy and epileptic syndromes, intractable, without status epilepticus: Secondary | ICD-10-CM

## 2023-12-21 DIAGNOSIS — Z9689 Presence of other specified functional implants: Secondary | ICD-10-CM

## 2023-12-21 DIAGNOSIS — G40401 Other generalized epilepsy and epileptic syndromes, not intractable, with status epilepticus: Secondary | ICD-10-CM

## 2023-12-21 DIAGNOSIS — G40119 Localization-related (focal) (partial) symptomatic epilepsy and epileptic syndromes with simple partial seizures, intractable, without status epilepticus: Secondary | ICD-10-CM

## 2023-12-21 MED ORDER — CARBAMAZEPINE 100 MG PO CHEW: 150.0000 mg | CHEWABLE_TABLET | Freq: Three times a day (TID) | ORAL | 3 refills | Status: DC

## 2023-12-21 NOTE — Telephone Encounter (Signed)
 Mother returned my call and reports that migraines, nausea, dizziness and balance issues have subsided since decreasing tegretol to 150 mg three time daily. She does report 2 seizures before the decrease but the VNS stopped them. She describes as holding his breathing and body stiffening but no convulsions. She would like [place] sent in as she thinks it did help the patient, They are coming for lab work tomorrow and want to know if thyroid panel could be drawn at same time and sent to PCP. She denies any other illnesses or seizure triggers  Advised I would send to Dr. Teresa Coombs for review and follow up with Mychart message. Mother appreciative.

## 2023-12-21 NOTE — Telephone Encounter (Signed)
 Call to mother after receiving MyChart message, responded to messaged and called and left voicemail to return call

## 2023-12-22 ENCOUNTER — Other Ambulatory Visit (INDEPENDENT_AMBULATORY_CARE_PROVIDER_SITE_OTHER): Payer: Self-pay

## 2023-12-22 DIAGNOSIS — Z9689 Presence of other specified functional implants: Secondary | ICD-10-CM

## 2023-12-22 DIAGNOSIS — Z0289 Encounter for other administrative examinations: Secondary | ICD-10-CM

## 2023-12-22 DIAGNOSIS — G40909 Epilepsy, unspecified, not intractable, without status epilepticus: Secondary | ICD-10-CM

## 2023-12-22 DIAGNOSIS — G40319 Generalized idiopathic epilepsy and epileptic syndromes, intractable, without status epilepticus: Secondary | ICD-10-CM

## 2023-12-22 DIAGNOSIS — Z5181 Encounter for therapeutic drug level monitoring: Secondary | ICD-10-CM

## 2023-12-22 DIAGNOSIS — G40401 Other generalized epilepsy and epileptic syndromes, not intractable, with status epilepticus: Secondary | ICD-10-CM

## 2023-12-22 DIAGNOSIS — G40813 Lennox-Gastaut syndrome, intractable, with status epilepticus: Secondary | ICD-10-CM

## 2023-12-22 DIAGNOSIS — G40119 Localization-related (focal) (partial) symptomatic epilepsy and epileptic syndromes with simple partial seizures, intractable, without status epilepticus: Secondary | ICD-10-CM

## 2023-12-28 ENCOUNTER — Other Ambulatory Visit: Payer: Self-pay | Admitting: Neurology

## 2023-12-28 ENCOUNTER — Encounter: Payer: Self-pay | Admitting: Neurology

## 2023-12-28 LAB — COMPREHENSIVE METABOLIC PANEL WITH GFR
ALT: 12 IU/L (ref 0–44)
AST: 14 IU/L (ref 0–40)
Albumin: 4.1 g/dL (ref 4.1–5.1)
Alkaline Phosphatase: 80 IU/L (ref 44–121)
BUN/Creatinine Ratio: 13 (ref 9–20)
BUN: 13 mg/dL (ref 6–20)
Bilirubin Total: 0.3 mg/dL (ref 0.0–1.2)
CO2: 20 mmol/L (ref 20–29)
Calcium: 9 mg/dL (ref 8.7–10.2)
Chloride: 104 mmol/L (ref 96–106)
Creatinine, Ser: 0.99 mg/dL (ref 0.76–1.27)
Globulin, Total: 2.3 g/dL (ref 1.5–4.5)
Glucose: 88 mg/dL (ref 70–99)
Potassium: 4.3 mmol/L (ref 3.5–5.2)
Sodium: 139 mmol/L (ref 134–144)
Total Protein: 6.4 g/dL (ref 6.0–8.5)
eGFR: 101 mL/min/{1.73_m2} (ref >59)

## 2023-12-28 LAB — LEVETIRACETAM LEVEL: Levetiracetam Lvl: 25.1 ug/mL (ref 10.0–40.0)

## 2023-12-28 LAB — VITAMIN D 25 HYDROXY (VIT D DEFICIENCY, FRACTURES): Vit D, 25-Hydroxy: 22.7 ng/mL — ABNORMAL LOW (ref 30.0–100.0)

## 2023-12-28 LAB — T4: T4, Total: 5.5 ug/dL (ref 4.5–12.0)

## 2023-12-28 LAB — LACOSAMIDE: Lacosamide: 6.8 ug/mL (ref 5.0–10.0)

## 2023-12-28 LAB — T3: T3, Total: 101 ng/dL (ref 71–180)

## 2023-12-28 LAB — CARBAMAZEPINE LEVEL, TOTAL: Carbamazepine (Tegretol), S: 7.4 ug/mL (ref 4.0–12.0)

## 2023-12-28 LAB — PHENOBARBITAL LEVEL: Phenobarbital, Serum: <3 ug/mL — ABNORMAL LOW (ref 15–40)

## 2023-12-28 LAB — TOPIRAMATE LEVEL: Topiramate Lvl: 5.4 ug/mL (ref 2.0–25.0)

## 2023-12-28 LAB — CLOBAZAM
Clobazam: 258 ng/mL (ref 30–300)
Desmethylclobazam: 4175 ng/mL — ABNORMAL HIGH (ref 300–3000)

## 2023-12-28 LAB — TSH: TSH: 0.04 u[IU]/mL — ABNORMAL LOW (ref 0.450–4.500)

## 2023-12-28 MED ORDER — VITAMIN D (ERGOCALCIFEROL) 1.25 MG (50000 UNIT) PO CAPS: 50000.0000 [IU] | ORAL_CAPSULE | ORAL | 0 refills | Status: DC

## 2024-01-25 ENCOUNTER — Ambulatory Visit: Payer: MEDICAID | Admitting: Neurology

## 2024-02-15 ENCOUNTER — Emergency Department (HOSPITAL_BASED_OUTPATIENT_CLINIC_OR_DEPARTMENT_OTHER): Admission: EM | Admit: 2024-02-15 | Discharge: 2024-02-15 | Disposition: A | Discharge status: Home / Self Care

## 2024-02-15 ENCOUNTER — Encounter (HOSPITAL_BASED_OUTPATIENT_CLINIC_OR_DEPARTMENT_OTHER): Payer: Self-pay

## 2024-02-15 ENCOUNTER — Emergency Department (HOSPITAL_BASED_OUTPATIENT_CLINIC_OR_DEPARTMENT_OTHER)

## 2024-02-15 ENCOUNTER — Other Ambulatory Visit: Payer: Self-pay

## 2024-02-15 DIAGNOSIS — Y9301 Activity, walking, marching and hiking: Secondary | ICD-10-CM | POA: Insufficient documentation

## 2024-02-15 DIAGNOSIS — W010XXA Fall on same level from slipping, tripping and stumbling without subsequent striking against object, initial encounter: Secondary | ICD-10-CM | POA: Diagnosis not present

## 2024-02-15 DIAGNOSIS — E039 Hypothyroidism, unspecified: Secondary | ICD-10-CM | POA: Insufficient documentation

## 2024-02-15 DIAGNOSIS — Z79890 Hormone replacement therapy (postmenopausal): Secondary | ICD-10-CM | POA: Diagnosis not present

## 2024-02-15 DIAGNOSIS — S0990XA Unspecified injury of head, initial encounter: Secondary | ICD-10-CM | POA: Insufficient documentation

## 2024-02-15 DIAGNOSIS — Y92019 Unspecified place in single-family (private) house as the place of occurrence of the external cause: Secondary | ICD-10-CM | POA: Insufficient documentation

## 2024-02-15 DIAGNOSIS — W19XXXA Unspecified fall, initial encounter: Secondary | ICD-10-CM

## 2024-02-15 MED ORDER — ACETAMINOPHEN 500 MG PO TABS
1000.0000 mg | ORAL_TABLET | Freq: Once | ORAL | Status: AC
  Administered 2024-02-15: 1000 mg via ORAL
  Filled 2024-02-15: qty 2

## 2024-02-15 NOTE — Discharge Instructions (Signed)
 CT scan of the head did not show any acute bleed or any acute abnormality from the incident.  Recommend continue treating patient's symptoms of headache with at home medications.  Recommend follow-up with your primary care for reassessment.  Please do not hesitate to return if the worrisome signs and symptoms discussed become apparent.

## 2024-02-15 NOTE — ED Triage Notes (Signed)
 In for eval of trip and fall. He got tripped up in his blanket at approx 1600 today. Positive LOC per father. Reports hitting the back of his head. Tired and feeling head pain.

## 2024-02-15 NOTE — ED Provider Notes (Signed)
 [place] EMERGENCY DEPARTMENT AT [name] Provider Note   CSN: [id number] Arrival date & time: 02/15/24  1630     History  Chief Complaint  Patient presents with   [name] is a 38 y.o. male.   Fall   38 year old male presents emergency department after fall.  Was walking in his house and excellently got his feet caught up in a blanket and fell backward striking his head against the ground.  Denies any visual disturbance, gait abnormality from baseline, slurred speech, facial droop, weakness/sensory deficits in upper extremities.  Denies any chest pain, abdominal pain, nausea, vomiting, seizure, back/neck pain, pain in upper or lower extremities.  Presents with parents given history of head injury with subdural hematoma and "1 of extremity things okay."  Denies any blood thinner use.  Past medical history significant for epilepsy, hypothyroidism, migraine, mitral valve disease, OSA, DVT  Home Medications Prior to Admission medications   Medication Sig Start Date End Date Taking? Authorizing Provider  albuterol  (ACCUNEB ) 1.25 MG/3ML nebulizer solution Take 1 ampule by nebulization every 4 (four) hours as needed for wheezing.    [provider]  Calcium -Vitamin D -Vitamin K (VIACTIV CALCIUM  PLUS D) 650-12.5-40 MG-MCG-MCG CHEW Chew 1 each by mouth daily.    [provider]  carbamazepine  (TEGRETOL ) 100 MG chewable tablet Chew 1.5 tablets (150 mg total) by mouth 3 (three) times daily. 12/21/23   Camara, Amadou, MD  cetirizine (ZYRTEC) 10 MG tablet Take 10 mg by mouth daily.    [provider]  Flaxseed, Linseed, (FLAXSEED OIL) 1000 MG CAPS Take 1,000 mg by mouth daily.    [provider]  K-PHOS  500 MG tablet TAKE 1 TABLET BY MOUTH FOUR TIMES A DAY 10/19/23   Camara, Nancye Azure, MD  KLOR-CON  M20 20 MEQ tablet TAKE 1 TABLET BY MOUTH EVERY DAY 10/30/23   Camara, Amadou, MD  levETIRAcetam  (KEPPRA ) 500 MG tablet Take 3 tablets  (1,500 mg total) by mouth in the morning and at bedtime. 04/08/23 04/02/24  Camara, Amadou, MD  levothyroxine  (SYNTHROID ) 75 MCG tablet Take 75 mcg by mouth daily before breakfast.    [provider]  LORazepam  (ATIVAN ) 2 MG/ML injection Inject 1 mL (2 mg total) into the vein See admin instructions. Dilute 2mg  of lorazepam  with 1mL of saline.  Administer IV push over 3-5 minutes as needed for seizures. 05/06/23   Camara, Amadou, MD  montelukast  (SINGULAIR ) 10 MG tablet Take 10 mg by mouth at bedtime. 02/05/18   [provider]  Multiple Vitamin (MULTIVITAMIN WITH MINERALS) TABS Take 1 tablet by mouth daily.    [provider]  Omega-3 Fatty Acids (FISH OIL) 500 MG CAPS Take 500 mg by mouth daily.    [provider]  ondansetron  (ZOFRAN -ODT) 4 MG disintegrating tablet Take 1 tablet (4 mg total) by mouth every 8 (eight) hours as needed for nausea or vomiting. 12/17/23   Cassandra Cleveland, MD  [name]  10 MG tablet TAKE 1 TABLET BY MOUTH IN THE MORNING, AND 2 AT BEDTIME 10/19/23   Cassandra Cleveland, MD  PHENObarbital  (LUMINAL) 65 MG/ML injection Withdraw 1ml (65mg ) Phenobarbital  and 4ml Normal Saline into 5ml syringe. Adminster IV push for 3-5 minutes as needed for recurrent seizures 05/06/23   Cassandra Cleveland, MD  promethazine  (PHENERGAN ) 25 MG suppository Place 25 mg rectally every 6 (six) hours as needed for nausea or vomiting. 07/04/19   [provider]  rosuvastatin  (CRESTOR ) 10 MG tablet Take 10 mg by  mouth at bedtime. 05/26/16   [provider]  Topiramate  ER (TROKENDI  XR) 200 MG CP24 TAKE 1 CAPSULE BY MOUTH AT BEDTIME. 10/19/23   Camara, Amadou, MD  TROKENDI  XR 50 MG CP24 Take 1 capsule at nighttime 11/17/23   Camara, Amadou, MD  VIMPAT  100 MG TABS TAKE 1 TABLET IN THE MORNING AND TAKE 2 TABLETS AT NIGHT 12/08/23   Cassandra Cleveland, MD  Vitamin D , Ergocalciferol , (DRISDOL ) 1.25 MG (50000 UNIT) CAPS capsule Take 1 capsule (50,000 Units total) by mouth every 7 (seven)  days. 12/28/23   Camara, Amadou, MD      Allergies    Antihistamines, chlorpheniramine-type; Divalproex sodium; Phenytoin; Valproic acid and related; Vancomycin ; Antihistamines, diphenhydramine-type; Phenytoin sodium extended; Valproic acid; and Divalproex sodium    Review of Systems   Review of Systems  All other systems reviewed and are negative.   Physical Exam Updated Vital Signs BP 109/70 (BP Location: Right Arm)   Pulse 75   Temp 97.9 F (36.6 C)   Resp 17   Ht 5\' 3"  (1.6 m)   Wt 61.2 kg   SpO2 96%   BMI 23.91 kg/m  Physical Exam Vitals and nursing note reviewed.  Constitutional:      General: He is not in acute distress.    Appearance: He is well-developed.  HENT:     Head: Normocephalic and atraumatic.  Eyes:     Conjunctiva/sclera: Conjunctivae normal.  Cardiovascular:     Rate and Rhythm: Normal rate and regular rhythm.     Heart sounds: No murmur heard. Pulmonary:     Effort: Pulmonary effort is normal. No respiratory distress.     Breath sounds: Normal breath sounds.  Abdominal:     Palpations: Abdomen is soft.     Tenderness: There is no abdominal tenderness.  Musculoskeletal:        General: No swelling.     Cervical back: Neck supple.     Comments: No midline tenderness cervical, thoracic and lumbar spine without step-off or deformity.  No chest wall tenderness.  No reproducible tenderness bilateral upper or lower extremities.  Skin:    General: Skin is warm and dry.     Capillary Refill: Capillary refill takes less than 2 seconds.  Neurological:     Mental Status: He is alert.     Comments: Alert and oriented to self, place, time and event.   Speech is fluent, clear without dysarthria or dysphasia.   Strength symmetric in upper/lower extremities   Sensation intact in upper/lower extremities   CN I not tested  CN II not tested CN Perez, IV, VI PERRLA and EOMs intact bilaterally  CN V Intact sensation to sharp and light touch to the face  CN VII  facial movements symmetric  CN VIII not tested  CN IX, X no uvula deviation, symmetric rise of soft palate  CN XI symmetric SCM and trapezius strength bilaterally  CN XII Midline tongue protrusion, symmetric L/R movements     Psychiatric:        Mood and Affect: Mood normal.     ED Results / Procedures / Treatments   Labs (all labs ordered are listed, but only abnormal results are displayed) Labs Reviewed - No data to display  EKG None  Radiology CT Head Wo Contrast Result Date: 02/15/2024 CLINICAL DATA:  Tripped and fell, hit back of head EXAM: CT HEAD WITHOUT CONTRAST TECHNIQUE: Contiguous axial images were obtained from the base of the skull through the vertex without  intravenous contrast. RADIATION DOSE REDUCTION: This exam was performed according to the departmental dose-optimization program which includes automated exposure control, adjustment of the mA and/or kV according to patient size and/or use of iterative reconstruction technique. 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 No evidence of acute infarct or hemorrhage. Dense bilateral basal ganglia calcifications are again noted. The lateral ventricles and remaining midline structures are unremarkable. There are no acute extra-axial fluid collections. No mass effect. Vascular: No hyperdense vessel or unexpected calcification. Skull: Normal. Negative for fracture or focal lesion. Sinuses/Orbits: Trace right mastoid effusion. Paranasal sinuses are clear. Other: None. IMPRESSION: 1. 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 No mass effect or evidence of acute hemorrhage. 2. No acute intracranial process. 3. Small right mastoid effusion. Electronically Signed   By: Bobbye Burrow M.D.   On: 02/15/2024 17:19    Procedures Procedures     Medications Ordered in ED Medications  acetaminophen  (TYLENOL ) tablet 1,000 mg (1,000 mg Oral Given 02/15/24 1712)    ED Course/ Medical Decision Making/ A&P                                 Medical Decision Making Amount and/or Complexity of Data Reviewed Radiology: ordered.  Risk OTC drugs.   This patient presents to the ED for concern of fall, head injury, this involves an extensive number of treatment options, and is a complaint that carries with it a high risk of complications and morbidity.  The differential diagnosis includes CVA, strain/sprain, dislocation, ligamentous/tendinous injury, neurologic compromise, concussion, other   Co morbidities that complicate the patient evaluation  See HPI   Additional history obtained:  Additional history obtained from EMR External records from outside source obtained and reviewed including hospital records   Lab Tests:  N/a   Imaging Studies ordered:  I ordered imaging studies including CT Head  I independently visualized and interpreted imaging which showed no acute intracranial abnormality.  Chronic subdural hematomas 3 mm.  Right mastoid effusion. I agree with the radiologist interpretation   Cardiac Monitoring: / EKG:  N/a   Consultations Obtained:  N/a   Problem List / ED Course / Critical interventions / Medication management  Fall, head injury I ordered medication including tylenol    Reevaluation of the patient after these medicines showed that the patient resolved I have reviewed the patients home medicines and have made adjustments as needed   Social Determinants of Health:  Denies tobacco, licit drug use.   Test / Admission - Considered:  Fall, head injury Vitals signs within normal range and stable throughout visit. Imaging studies significant for: See above 38 year old male presents emergency department after fall.  Was walking in his house and excellently got his feet caught up in a  blanket and fell backward striking his head against the ground.  Denies any visual disturbance, gait abnormality from baseline, slurred speech, facial droop, weakness/sensory deficits in upper extremities.  Denies any chest pain, abdominal pain, nausea, vomiting, seizure, back/neck pain, pain in upper or lower extremities.  Presents with parents given history of head injury with subdural hematoma and "1 of extremity things okay."  Denies any blood thinner use. On exam, slight tenderness occipital region of head without obvious laceration/abrasion present.  Nonfocal neurologic exam from  baseline.  Given patient's history of subdural hematoma in the setting of head injury, CT imaging was obtained which is negative for any acute abnormality but did show evidence of chronic subdural hematoma prior imaged.  Given Tylenol  which noted resolution of headache.  Patient and parents reassured by findings.  Will recommend follow-up with PCP in the outpatient setting for reassessment.  Treatment plan discussed with patient and parents and they acknowledge understanding will be able to set plan.  Patient overall well-appearing, afebrile in no acute distress. Worrisome signs and symptoms were discussed with the patient/parents, and they acknowledged understanding to return to the ED if noticed. Patient was stable upon discharge.          Final Clinical Impression(s) / ED Diagnoses Final diagnoses:  Fall, initial encounter  Injury of head, initial encounter    Rx / DC Orders ED Discharge Orders     None         [place] Butter, [place] 02/15/24 1746    Carin Charleston, MD 02/15/24 2255

## 2024-02-16 ENCOUNTER — Other Ambulatory Visit: Payer: Self-pay | Admitting: Neurology

## 2024-02-16 DIAGNOSIS — G40319 Generalized idiopathic epilepsy and epileptic syndromes, intractable, without status epilepticus: Secondary | ICD-10-CM

## 2024-02-16 NOTE — Telephone Encounter (Signed)
 Requested Prescriptions   Pending Prescriptions Disp Refills   [name]  10 MG tablet [Pharmacy Med Name: [name]  10 MG TABLET] 90 tablet 3    Sig: TAKE 1 TABLET BY MOUTH IN THE MORNING, AND 2 AT BEDTIME   Last seen: 12/08/23 Next appt: 06/09/24  Last note stated:  Previous ASMs: Gabapentin, Tegretol , Lamotrigine, Felbatol, Depakote, Topamax , Keppra , [name] , Phenobarbital , Lacosamide , Phenytoin   [facility]ty ASMs: Tegretol  150 mg TID, Clobazam  07/02/19 mg, Vimpat  100/200 mg, Keppra  1000/1250 mg, Trokendi  250 mg at bedtime   Dispenses    Dispensed Days Supply Quantity Provider Pharmacy  [name]  10 MG TABLET 01/15/2024 30 90 each Perez, Amadou, MD [facility] [phone] - G...  [name]  10 MG TABLET 12/17/2023 30 90 each Perez, Amadou, MD [facility] [phone] - G...  [name]  10 MG TABLET 11/17/2023 30 90 each Perez, Amadou, MD [facility] [phone] - G...  [name]  10 MG TABLET 10/19/2023 30 90 each Marcus Cleveland, MD [facility] [phone] - G...  [name]  10 MG TABLET 09/19/2023 30 90 each Perez, Amadou, MD [facility] [phone] - G...  [name]  10 MG TABLET 08/22/2023 30 90 each Perez, Amadou, MD [facility] [phone] - G...  [name]  10 MG TABLET 07/20/2023 30 90 each Perez, Amadou, MD [facility] [phone] - G...  [name]  10 MG TABLET 06/16/2023 30 90 each Perez, Amadou, MD [facility] [phone] - G...  [name]  10 MG TABLET 05/04/2023 30 120 each Marcus Malta, MD [facility] [phone] - G...  [name]  10 MG TABLET 04/03/2023 30 120 each Marcus Malta, MD [facility] [phone] - G...  [name]  10 MG TABLET 03/06/2023 30 120 each Marcus Malta, MD [facility] [phone] - G.Aaron AasAaron Aas

## 2024-02-20 ENCOUNTER — Other Ambulatory Visit: Payer: Self-pay | Admitting: Neurology

## 2024-02-25 ENCOUNTER — Telehealth: Payer: Self-pay | Admitting: Neurology

## 2024-02-25 NOTE — Telephone Encounter (Signed)
 June 5 at 2:22 Pt wife LVM Stating that  Pt has been getting Medication and supplies from  [facility] . [facility] informed Pt that they are having issues with getting Pt medication . Wife I states she is concern and need help to figure out what to do next .

## 2024-02-26 NOTE — Telephone Encounter (Signed)
 Call to mom, she reports [facility] is having difficulty getting medication from supplier. She also reports that he has 5 seizures on Wednesday 02/24/24. She reports his body locking up and staring but he did not convulse. She did states he was sick prior to the seizure and had thrown up medications. Advised I would check with [facility] on ativan  and let Dr. Samara Crest know about recent seizures. Also advised in multiple seizures back to back to go to the ER. Mom verbalized understanding.   Call to [facility] home infusion, on hold for a while. Hung up. Called back and spoke Amy, they can no longer get the ativan  for thomas. She states that they will likely no longer be able to continue the phenobarbital  alone as the patient was grand fathered in but now can't ordering controlled substances. They are getting ready to send out an order for him.   Call to mom and explained the above. She has enough medication for 3 PRN doses of Ativan  at this time. Advised I could check Dr. Samara Crest and other home infusions to see options for patient. Patient had tried nayzilam  and mom stated it didn't work. He does have VNS but mom says it does help with all seizures.

## 2024-02-28 ENCOUNTER — Other Ambulatory Visit: Payer: Self-pay

## 2024-02-28 ENCOUNTER — Emergency Department (HOSPITAL_BASED_OUTPATIENT_CLINIC_OR_DEPARTMENT_OTHER): Admitting: Radiology

## 2024-02-28 ENCOUNTER — Emergency Department (HOSPITAL_BASED_OUTPATIENT_CLINIC_OR_DEPARTMENT_OTHER)
Admission: EM | Admit: 2024-02-28 | Discharge: 2024-02-28 | Disposition: A | Discharge status: Home / Self Care | Attending: Emergency Medicine | Admitting: Emergency Medicine

## 2024-02-28 DIAGNOSIS — E860 Dehydration: Secondary | ICD-10-CM | POA: Insufficient documentation

## 2024-02-28 DIAGNOSIS — R112 Nausea with vomiting, unspecified: Secondary | ICD-10-CM | POA: Insufficient documentation

## 2024-02-28 DIAGNOSIS — R5381 Other malaise: Secondary | ICD-10-CM | POA: Insufficient documentation

## 2024-02-28 DIAGNOSIS — E039 Hypothyroidism, unspecified: Secondary | ICD-10-CM | POA: Diagnosis not present

## 2024-02-28 DIAGNOSIS — R051 Acute cough: Secondary | ICD-10-CM | POA: Insufficient documentation

## 2024-02-28 LAB — CBC WITH DIFFERENTIAL/PLATELET
Abs Immature Granulocytes: 0.02 10*3/uL (ref 0.00–0.07)
Basophils Absolute: 0 10*3/uL (ref 0.0–0.1)
Basophils Relative: 0 %
Eosinophils Absolute: 0.1 10*3/uL (ref 0.0–0.5)
Eosinophils Relative: 1 %
HCT: 40.1 % (ref 39.0–52.0)
Hemoglobin: 13 g/dL (ref 13.0–17.0)
Immature Granulocytes: 0 %
Lymphocytes Relative: 46 %
Lymphs Abs: 2.4 10*3/uL (ref 0.7–4.0)
MCH: 28 pg (ref 26.0–34.0)
MCHC: 32.4 g/dL (ref 30.0–36.0)
MCV: 86.2 fL (ref 80.0–100.0)
Monocytes Absolute: 0.6 10*3/uL (ref 0.1–1.0)
Monocytes Relative: 12 %
Neutro Abs: 2.2 10*3/uL (ref 1.7–7.7)
Neutrophils Relative %: 41 %
Platelets: 241 10*3/uL (ref 150–400)
RBC: 4.65 MIL/uL (ref 4.22–5.81)
RDW: 12.4 % (ref 11.5–15.5)
WBC: 5.3 10*3/uL (ref 4.0–10.5)
nRBC: 0 % (ref 0.0–0.2)

## 2024-02-28 LAB — BASIC METABOLIC PANEL WITH GFR
Anion gap: 14 (ref 5–15)
BUN: 14 mg/dL (ref 6–20)
CO2: 20 mmol/L — ABNORMAL LOW (ref 22–32)
Calcium: 8.8 mg/dL — ABNORMAL LOW (ref 8.9–10.3)
Chloride: 105 mmol/L (ref 98–111)
Creatinine, Ser: 0.91 mg/dL (ref 0.61–1.24)
GFR, Estimated: >60 mL/min (ref >60)
Glucose, Bld: 89 mg/dL (ref 70–99)
Potassium: 3.7 mmol/L (ref 3.5–5.1)
Sodium: 139 mmol/L (ref 135–145)

## 2024-02-28 LAB — URINALYSIS, ROUTINE W REFLEX MICROSCOPIC
Bacteria, UA: NONE SEEN
Bilirubin Urine: NEGATIVE
Glucose, UA: 500 mg/dL — AB
Ketones, ur: NEGATIVE mg/dL
Leukocytes,Ua: NEGATIVE
Nitrite: NEGATIVE
Protein, ur: 30 mg/dL — AB
Specific Gravity, Urine: 1.038 — ABNORMAL HIGH (ref 1.005–1.030)
pH: 6.5 (ref 5.0–8.0)

## 2024-02-28 LAB — CK: Total CK: 236 U/L (ref 49–397)

## 2024-02-28 LAB — CARBAMAZEPINE LEVEL, TOTAL: Carbamazepine Lvl: 7.2 ug/mL (ref 4.0–12.0)

## 2024-02-28 MED ORDER — HEPARIN SOD (PORK) LOCK FLUSH 100 UNIT/ML IV SOLN
500.0000 [IU] | Freq: Once | INTRAVENOUS | Status: AC
  Administered 2024-02-28: 500 [IU]
  Filled 2024-02-28: qty 5

## 2024-02-28 MED ORDER — SODIUM CHLORIDE 0.9 % IV BOLUS
1000.0000 mL | Freq: Once | INTRAVENOUS | Status: AC
  Administered 2024-02-28: 1000 mL via INTRAVENOUS

## 2024-02-28 MED ORDER — METOCLOPRAMIDE HCL 5 MG/ML IJ SOLN
10.0000 mg | Freq: Once | INTRAMUSCULAR | Status: AC
Start: 2024-02-28 — End: 2024-02-28
  Administered 2024-02-28: 10 mg via INTRAVENOUS
  Filled 2024-02-28: qty 2

## 2024-02-28 NOTE — ED Provider Notes (Signed)
  Physical Exam  BP (!) 88/64   Pulse 83   Temp 97.6 F (36.4 C) (Oral)   Resp 18   SpO2 100%   Physical Exam Vitals and nursing note reviewed.  Constitutional:      Appearance: Normal appearance.  HENT:     Head: Atraumatic.  Pulmonary:     Effort: Pulmonary effort is normal.  Neurological:     General: No focal deficit present.     Mental Status: He is alert.  Psychiatric:        Mood and Affect: Mood normal.        Behavior: Behavior normal.     Procedures  Procedures  ED Course / MDM    Medical Decision Making Amount and/or Complexity of Data Reviewed Labs: ordered. Radiology: ordered.  Risk Prescription drug management.   Patient accepted at signout.  In short, patient was brought in by his mother (caretaker) at bedside for concern for more malaise and decreased appetite over the past couple days.  He has also been having more frequent seizures than normal.  No seizures today or seizures upon arrival.  CBC without any abnormalities.  BMP without any significant electrolyte abnormalities.  Urinalysis did show a high specific gravity, and given reported decreased appetite, patient suspected to be dehydrated by previous team and started on 2 L NS.  Urinalysis also showed large amount of red blood cells, but CK within normal limits.  He does not have any urinary symptoms, no nitrites or leukocytes, no concern for UTI at this time.  Carbamazepine  level within normal limits.  Chest x-ray was obtained which was without acute abnormality.  Unclear as to patient's malaise and decreased appetite, but suspect possible viral etiology.  Family reports he recently tested negative for strep and COVID.  No indication to repeat this testing today.  No vital sign abnormalities, abdominal pain, cough, or lab abnormalities to suggest other infectious etiology.  Patient was reevaluated after receiving 2 L NS bolus.  He is resting comfortably and getting some sleep.  Headache did  improve with fluids and Reglan .  Patient able to tolerate p.o. intake.  He is stable and appropriate for discharge home this time.   Patient and mother at bedside were instructed to follow-up with his PCP within the next 2 days for recheck of symptoms.  Patient instructed to keep well-hydrated at home.  Return precautions given.         Rexie Catena, PA-C 02/28/24 2031    Hershel Los, MD 02/28/24 2103

## 2024-02-28 NOTE — ED Triage Notes (Signed)
 Last Tuesday cough and emesis x24hrs. Wednesday had seizures-received phenobarbital  and ativan  at home. Wed tested negative for strep and COVID. Has not been eating well. Family suspects migraine or dehydration.

## 2024-02-28 NOTE — ED Provider Notes (Signed)
 [place] [facility]MENT AT [facility] Provider Note   CSN: [id number] Arrival date & time: 02/28/24  1405     History  Chief Complaint  Patient presents with   Dehydration    [name] is a 38 y.o. male.  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 Samara Crest at [facility] neurology --presents with family today for concern for dehydration.  Patient began having a cough and some vomiting starting on 02/23/2024.  He was having difficulty maintaining oral intake including his antiepileptics.  This led to a clustering of seizures in the morning on the following day.  These were general tonic-clonic seizures.  Family did treat at home on 2 occasions with Ativan  and phenobarbital  through his port.  Patient, at baseline, has 2-3 episodes of seizure per month.  With these, he has been slow to come back to baseline.  Family was uncertain if this was related to the seizure from the medications that were given.  He has been continuing to have cough, decreased appetite.  For this reason they were concerned about dehydration.  Previously tested negative for strep and COVID.  No history of UTI.         Home Medications Prior to Admission medications   Medication Sig Start Date End Date Taking? Authorizing Provider  albuterol  (ACCUNEB ) 1.25 MG/3ML nebulizer solution Take 1 ampule by nebulization every 4 (four) hours as needed for wheezing.    [provider]  Calcium -Vitamin D -Vitamin K (VIACTIV CALCIUM  PLUS D) 650-12.5-40 MG-MCG-MCG CHEW Chew 1 each by mouth daily.    [provider]  carbamazepine  (TEGRETOL ) 100 MG chewable tablet Chew 1.5 tablets (150 mg total) by mouth 3 (three) times daily. 12/21/23   Camara, Amadou, MD   cetirizine (ZYRTEC) 10 MG tablet Take 10 mg by mouth daily.    [provider]  Flaxseed, Linseed, (FLAXSEED OIL) 1000 MG CAPS Take 1,000 mg by mouth daily.    [provider]  K-PHOS  500 MG tablet TAKE 1 TABLET BY MOUTH FOUR TIMES A DAY 10/19/23   Cassandra Cleveland, MD  KLOR-CON  M20 20 MEQ tablet TAKE 1 TABLET BY MOUTH EVERY DAY 10/30/23   Camara, Amadou, MD  levETIRAcetam  (KEPPRA ) 500 MG tablet Take 3 tablets (1,500 mg total) by mouth in the morning and at bedtime. 04/08/23 04/02/24  Camara, Amadou, MD  levothyroxine  (SYNTHROID ) 75 MCG tablet Take 75 mcg by mouth daily before breakfast.    [provider]  LORazepam  (ATIVAN ) 2 MG/ML injection Inject 1 mL (2 mg total) into the vein See admin instructions. Dilute 2mg  of lorazepam  with 1mL of saline.  Administer IV push over 3-5 minutes as needed for seizures. 05/06/23   Camara, Amadou, MD  montelukast  (SINGULAIR ) 10 MG tablet Take 10 mg by mouth at bedtime. 02/05/18   [provider]  Multiple Vitamin (MULTIVITAMIN WITH MINERALS) TABS Take 1 tablet by mouth daily.    [provider]  Omega-3 Fatty Acids (FISH OIL) 500 MG CAPS Take 500 mg by mouth daily.    [provider]  ondansetron  (ZOFRAN -ODT) 4 MG disintegrating tablet Take 1 tablet (4 mg total) by mouth every 8 (eight) hours as needed for nausea or vomiting. 12/17/23   Cassandra Cleveland, MD  [name]  10 MG tablet TAKE 1 TABLET BY MOUTH IN THE MORNING, AND 2 AT BEDTIME 02/17/24  Cassandra Cleveland, MD  PHENObarbital  (LUMINAL) 65 MG/ML injection Withdraw 1ml (65mg ) Phenobarbital  and 4ml Normal Saline into 5ml syringe. Adminster IV push for 3-5 minutes as needed for recurrent seizures 05/06/23   Cassandra Cleveland, MD  promethazine  (PHENERGAN ) 25 MG suppository Place 25 mg rectally every 6 (six) hours as needed for nausea or vomiting. 07/04/19   [provider]  rosuvastatin  (CRESTOR ) 10 MG tablet Take 10 mg by mouth at bedtime. 05/26/16   [provider]   Topiramate  ER (TROKENDI  XR) 200 MG CP24 TAKE 1 CAPSULE BY MOUTH AT BEDTIME. 10/19/23   Camara, Amadou, MD  TROKENDI  XR 50 MG CP24 Take 1 capsule at nighttime 11/17/23   Camara, Amadou, MD  VIMPAT  100 MG TABS TAKE 1 TABLET IN THE MORNING AND TAKE 2 TABLETS AT NIGHT 12/08/23   Camara, Nancye Azure, MD  Vitamin D , Ergocalciferol , (DRISDOL ) 1.25 MG (50000 UNIT) CAPS capsule TAKE 1 CAPSULE (50,000 UNITS TOTAL) BY MOUTH EVERY 7 (SEVEN) DAYS 02/22/24   Cassandra Cleveland, MD      Allergies    Antihistamines, chlorpheniramine-type; Divalproex sodium; Phenytoin; Valproic acid and related; Vancomycin ; Antihistamines, diphenhydramine-type; Phenytoin sodium extended; Valproic acid; and Divalproex sodium    Review of Systems   Review of Systems  Physical Exam Updated Vital Signs BP 97/76   Pulse 82   Temp 97.7 F (36.5 C) (Oral)   Resp 18   SpO2 97%   Physical Exam Vitals and nursing note reviewed.  Constitutional:      General: He is not in acute distress.    Appearance: He is well-developed.  HENT:     Head: Normocephalic and atraumatic.     Right Ear: External ear normal.     Left Ear: External ear normal.     Mouth/Throat:     Mouth: Mucous membranes are dry.     Comments: Mucous membranes are dry Eyes:     General:        Right eye: No discharge.        Left eye: No discharge.     Conjunctiva/sclera: Conjunctivae normal.  Cardiovascular:     Rate and Rhythm: Normal rate and regular rhythm.     Heart sounds: Normal heart sounds.  Pulmonary:     Effort: Pulmonary effort is normal.     Breath sounds: Normal breath sounds.  Chest:    Abdominal:     Palpations: Abdomen is soft.     Tenderness: There is no abdominal tenderness.  Musculoskeletal:     Cervical back: Normal range of motion and neck supple.  Skin:    General: Skin is warm and dry.  Neurological:     Mental Status: He is alert. Mental status is at baseline.     ED Results / Procedures / Treatments   Labs (all labs  ordered are listed, but only abnormal results are displayed) Labs Reviewed  BASIC METABOLIC PANEL WITH GFR - Abnormal; Notable for the following components:      Result Value   CO2 20 (*)    Calcium  8.8 (*)    All other components within normal limits  URINALYSIS, ROUTINE W REFLEX MICROSCOPIC - Abnormal; Notable for the following components:   Specific Gravity, Urine 1.038 (*)    Glucose, UA 500 (*)    Hgb urine dipstick TRACE (*)    Protein, ur 30 (*)    All other components within normal limits  CBC WITH DIFFERENTIAL/PLATELET  CK  CARBAMAZEPINE  LEVEL, TOTAL    EKG None  Radiology DG Chest 2 View Result Date: 02/28/2024 CLINICAL DATA:  Cough emesis, seizures EXAM: CHEST - 2 VIEW COMPARISON:  06/01/2022 FINDINGS: Cardiomegaly. Right chest port catheter. Left chest vagal stimulator. No acute airspace opacity. No acute osseous findings. IMPRESSION: No acute abnormality of the lungs. Electronically Signed   By: Fredricka Jenny M.D.   On: 02/28/2024 16:21    Procedures Procedures    Medications Ordered in ED Medications  heparin  lock flush 100 unit/mL (has no administration in time range)  sodium chloride  0.9 % bolus 1,000 mL (0 mLs Intravenous Stopped 02/28/24 1749)  sodium chloride  0.9 % bolus 1,000 mL (1,000 mLs Intravenous New Bag/Given 02/28/24 1811)  metoCLOPramide  (REGLAN ) injection 10 mg (10 mg Intravenous Given 02/28/24 1807)    ED Course/ Medical Decision Making/ A&P    Patient seen and examined. History obtained directly from family.  Reviewed outpatient neurology notes.  Labs/EKG: Ordered CBC, BMP.  Imaging: Ordered chest x-ray.  Medications/Fluids: Ordered: Fluid bolus.   Most recent vital signs reviewed and are as follows: BP 97/76   Pulse 82   Temp 97.7 F (36.5 C) (Oral)   Resp 18   SpO2 97%   Initial impression: Decreased appetite, slower to respond than baseline, recent seizure spell in setting of vomiting and poor oral intake.  5:56 PM Reassessment  performed. Patient appears stable.  Urine at bedside is extremely dark.  Will continue to hydrate.  Will give Reglan  for headache.  Patient has an intolerance to antihistamines.  Labs personally reviewed and interpreted including: Added CK, UA, carbamazepine   Imaging personally visualized and interpreted including: Chest x-ray, agree negative  Reviewed pertinent lab work and imaging with family at bedside. Questions answered.   Most current vital signs reviewed and are as follows: BP 92/70 (BP Location: Right Arm)   Pulse 73   Temp 97.7 F (36.5 C) (Oral)   Resp 16   SpO2 99%   Plan: Will continue IV hydration.  At this point, no real strong indications for admission, however patient would continue to benefit from hydration.  7:00 PM UA with some blood, no compelling signs of infection, creatinine kinase level was normal, carbamazepine  level was normal.  Patient sleeping after IV Reglan .  Signout to [place] PA-C at shift change. Follow-up on completion of IV hydration.  Current plan, discussed with patient's family member, states discharge after IV fluids.  Encourage follow-up in the next 2 days for recheck to make sure that he is returning to baseline.  We discussed return with any worsening symptoms, worsening control of seizures, vomiting, new or worsening symptoms.                                Medical Decision Making Amount and/or Complexity of Data Reviewed Labs: ordered. Radiology: ordered.  Risk Prescription drug management.   Patient with the above medical issues presents with change from baseline, really described as a malaise, decreased appetite.  He has had a cough.  Workup today was reassuring.  He definitely did have an element of dehydration, was treated with 2 bags of IV fluids.  He is tolerating orals here.  Kidney function is normal.  Chest x-ray is clear.  No fevers, blood pressure a bit soft but low concern for sepsis currently.  UA is noninfected.  He is  near baseline at this point, but would benefit from outpatient PCP follow-up to ensure improvement over next few days.  No  strong indications for admission at this point.        Final Clinical Impression(s) / ED Diagnoses Final diagnoses:  Acute cough  Nausea and vomiting, unspecified vomiting type  Malaise  Dehydration    Rx / DC Orders ED Discharge Orders     None         Lyna Sandhoff, PA-C 02/28/24 1859    Hershel Los, MD 02/28/24 2103

## 2024-02-28 NOTE — ED Notes (Signed)
 Patient transported to CT

## 2024-02-28 NOTE — Discharge Instructions (Addendum)
 Please read and follow all provided instructions.  Your diagnoses today include:  1. Dehydration   2. Acute cough   3. Nausea and vomiting, unspecified vomiting type   4. Malaise     Tests performed today include: Complete blood cell count: Normal white and red blood cells Basic metabolic panel: Normal kidney function Urinalysis (urine test): Was concentrated, some blood noted, no signs of infection Creatinine kinase: Blood test for muscle breakdown, was normal Carbamazepine  level: Was normal Vital signs. See below for your results today.   Home care instructions:  Follow any educational materials contained in this packet.  BE VERY CAREFUL not to take multiple medicines containing Tylenol  (also called acetaminophen ). Doing so can lead to an overdose which can damage your liver and cause liver failure and possibly death.   Follow-up instructions: Please follow-up with your primary care provider in the next 2 days for further evaluation of your symptoms.   Return instructions:  Please return to the Emergency Department if you experience worsening symptoms.  Please return if you have any other emergent concerns.  Additional Information:  Your vital signs today were: BP 92/70 (BP Location: Right Arm)   Pulse 73   Temp 97.7 F (36.5 C) (Oral)   Resp 16   SpO2 99%  If your blood pressure (BP) was elevated above 135/85 this visit, please have this repeated by your doctor within one month. --------------

## 2024-02-29 ENCOUNTER — Telehealth: Payer: Self-pay | Admitting: Neurology

## 2024-02-29 NOTE — Telephone Encounter (Signed)
 Called and scheduled followup appt for tomorrow as requested for migraine

## 2024-02-29 NOTE — Telephone Encounter (Signed)
 Pt molter  stating that Pt went to Hospital 3 days ago , Due to  really bad Migraine  hospital recommended  Pt  follow up with Neurologist in the next two weeks . Pt is scheduled for 06/09/24 as now.

## 2024-03-01 ENCOUNTER — Encounter: Payer: Self-pay | Admitting: Neurology

## 2024-03-01 ENCOUNTER — Ambulatory Visit (INDEPENDENT_AMBULATORY_CARE_PROVIDER_SITE_OTHER): Admitting: Neurology

## 2024-03-01 VITALS — BP 102/67 | HR 88 | Ht 63.0 in | Wt 122.5 lb

## 2024-03-01 DIAGNOSIS — G40813 Lennox-Gastaut syndrome, intractable, with status epilepticus: Secondary | ICD-10-CM

## 2024-03-01 DIAGNOSIS — Z9689 Presence of other specified functional implants: Secondary | ICD-10-CM | POA: Diagnosis not present

## 2024-03-01 DIAGNOSIS — G4733 Obstructive sleep apnea (adult) (pediatric): Secondary | ICD-10-CM

## 2024-03-01 DIAGNOSIS — G40119 Localization-related (focal) (partial) symptomatic epilepsy and epileptic syndromes with simple partial seizures, intractable, without status epilepticus: Secondary | ICD-10-CM

## 2024-03-01 DIAGNOSIS — G40319 Generalized idiopathic epilepsy and epileptic syndromes, intractable, without status epilepticus: Secondary | ICD-10-CM

## 2024-03-01 DIAGNOSIS — G43709 Chronic migraine without aura, not intractable, without status migrainosus: Secondary | ICD-10-CM

## 2024-03-01 MED ORDER — ZAVZPRET 10 MG/ACT NA SOLN: 10.0000 mg | NASAL | 6 refills | Status: AC | PRN

## 2024-03-01 MED ORDER — VALTOCO 20 MG DOSE 2 X 10 MG/0.1ML NA LQPK: 20.0000 mg | NASAL | 5 refills | Status: AC | PRN

## 2024-03-01 NOTE — Progress Notes (Unsigned)
 [facility]  PATIENT: [name] DOB: [birth year]  REQUESTING CLINICIAN: Beam, Conway Dennis, MD HISTORY FROM: Mother, sister and chart review  REASON FOR VISIT: Transitioning from peds to adult. Epilepsy management.    HISTORICAL  CHIEF COMPLAINT:  Chief Complaint  Patient presents with   Seizures    Rm12, mother and father present, last sz was June 4th and had 4 sz, 3 sz occurred 02/25/24. This occurred due to unable to swallow sz meds when having migraine with nausea and vomiting    Migraine    Rm12, mother and father present, pt mentioned having migraines:approximately 10 or more with nausea and vomiting. Having to take zofran  more often. Received migraine cocktail at hospital. Due to vomitting wasn't ab;e to take sz med   INTERVAL HISTORY 03/01/2024 [name] present today for follow-up, he is accompanied by both parents.  Last visit was in March, at that time we increase his Tegretol  to 200 mg TID and continued his medications but he had dizziness, so family decided to reduce the Tegretol  to 150 mg TID.  He continued to have seizures, on average 2 to 3/month.  Last week he did have a cluster of seizures due to migraines, vomiting, not able to hold his antiseizure medications.  He ended going to the hospital and receiving migraine cocktail and IV fluid for dehydration. When he comes to the seizure, mother has told me the patient has tried ketogenic diet in the past which was not successful, was on Epidiolex  caused him dizziness so it was not maximized.    INTERVAL HISTORY 12/08/2023:  [name] presents today for follow-up, he is accompanied by mother and sister.  Last visit was in July, at that time we continued current medications.  They tells me that he continues to have seizures, 1 every 2 to 3 weeks.  Family reports the seizures are mainly related to sleep, whenever falling asleep or waking up from sleep.  He is compliant with his medications, denies any side  effect. They are asking for a prescription for his O2 supplement sent to [facility] (Prescription previously sent by Dr. Darlys Eland)    INTERVAL HISTORY 04/08/2023:  [name] presents for follow-up, he is accompanied by mother and father.  Last visit was in May at that time we started him on Epidiolex  but it caused side effects of dizziness, gait instability, and somnolence.  He could not walk.  Due to these side effects, Epidiolex  was discontinued.  While on Epidiolex  he continued to have seizures 2 to 3/month.  His seizure frequency has not changed again 2 to 3/month.  He is compliant with his medications, does not swipe his VNS multiple times per day.  Family do not have any major concerns currently.   HISTORY OF PRESENT ILLNESS:  This is a 38 year old gentleman with multiple medical conditions including intellectual disability, intractable epilepsy status post VNS placement, hypothyroidism, hormone deficiency, obstructive sleep apnea on CPAP who is presenting to establish care.  Patient was initially previously managed by pediatric neurology and Dr. Sharlet Dawson at [facility] but now he needs a local adult neurologist.   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  6 months later he started having his first seizure.  Since then his seizures have been intractable.  He had multiple type of  seizures including tonic seizures, bilateral tonic-clonic seizures, focal sensory seizures where he is aware, atonic seizure where he  will fall and staring seizures.  He has tried multiple medication listed below.   Currently he is on the combination on 5 antiseizure medications including Tegretol  150 mg 3 times daily, clobazam  10/10 and 20 mg at bedtime, Vimpat  100 mg in the morning 200 at night, Keppra  1000 mg in the morning and 1250 in the evening and Trokendi  250 at bedtime.  Family reports that his seizures remain intractable despite VNS  therapy.  He continued to have 2-3 seizures a month.  He was admitted last in the hospital in February for seizures and his last seizure was on May 13.  Family has reported prolonged seizure where he will need CPR, he also had status where he was intubated but currently they feel like the seizures are stable not getting worse even though he is getting 2 to 3/month.  Denies any history of birth trauma and he did not stay in the ICU as a baby. They could not related any seizure risk factors.    Handedness: Right   Onset: At the age of 7  Seizure Type: Tonic seizures, bilateral tonic clonic seizures, atonic seizures (drop seizure), focal sensory seizures and staring seizures  Current frequency: Last seizures 5/13, 2 to 3 seizures a months   Any injuries from seizures: Fall, concussion   Seizure risk factors: None reported   Previous ASMs: Gabapentin, Tegretol , Lamotrigine, Felbatol, Depakote, Topamax , Keppra , [name] , Phenobarbital , Lacosamide , Phenytoin  Currenty ASMs: Tegretol  150 mg TID, Clobazam  10/20 mg, Vimpat  100/200 mg, Keppra  1500 mg BID, Trokendi  250 mg qHs   ASMs side effects: Allergic reaction to Phenytoin and Dilantin   Brain Images: No intracranial pathology. Last CT with subdural collection   Previous EEGs: Diffuse slow, focal left slowing   OTHER MEDICAL CONDITIONS: Intellectual disability, Intractable seizures s/p VNS placement, OSA on CPAP, Hypothyroidism   REVIEW OF SYSTEMS: Full 14 system review of systems performed and negative with exception of: As noted in the HPI   ALLERGIES: Allergies  Allergen Reactions   Antihistamines, Chlorpheniramine-Type Other (See Comments)    Other Reaction: Dimetapp causes seizures  Allergen- Dimetapp, Reaction - seizures   Divalproex Sodium Other (See Comments)    Nausea, vomiting, liver and kidney dysfunction   Phenytoin Rash and Other (See Comments)    Dilantin causes more seizures.    Valproic Acid And Related Other (See  Comments)    Shuts down systems   Vancomycin  Other (See Comments), Rash and Swelling    If given too fast, causes red man reaction  Other Reaction: Redman's Syndrome  Other Reaction(s): Angioedema   Antihistamines, Diphenhydramine-Type Other (See Comments)    seizures   Phenytoin Sodium Extended Swelling    Enhances seizures  Other Reaction(s): Angioedema   Valproic Acid Other (See Comments)    Shuts down systems  Other Reaction(s): Angioedema  Starts to shut down body functions   Divalproex Sodium Rash    Nausea, vomiting, liver and kidney dysfunction    HOME MEDICATIONS: Outpatient Medications Prior to Visit  Medication Sig Dispense Refill   albuterol  (ACCUNEB ) 1.25 MG/3ML nebulizer solution Take 1 ampule by nebulization every 4 (four) hours as needed for wheezing.     Calcium -Vitamin D -Vitamin K (VIACTIV CALCIUM  PLUS D) 650-12.5-40 MG-MCG-MCG CHEW Chew 1 each by mouth daily.     carbamazepine  (TEGRETOL ) 100 MG chewable tablet Chew 1.5 tablets (150 mg total) by mouth 3 (three) times daily. 135 tablet 3   cetirizine (ZYRTEC) 10 MG tablet Take 10 mg by mouth daily.  Flaxseed, Linseed, (FLAXSEED OIL) 1000 MG CAPS Take 1,000 mg by mouth daily.     K-PHOS  500 MG tablet TAKE 1 TABLET BY MOUTH FOUR TIMES A DAY 124 tablet 5   KLOR-CON  M20 20 MEQ tablet TAKE 1 TABLET BY MOUTH EVERY DAY 90 tablet 2   levETIRAcetam  (KEPPRA ) 500 MG tablet Take 3 tablets (1,500 mg total) by mouth in the morning and at bedtime. 540 tablet 3   levothyroxine  (SYNTHROID ) 75 MCG tablet Take 75 mcg by mouth daily before breakfast.     LORazepam  (ATIVAN ) 2 MG/ML injection Inject 1 mL (2 mg total) into the vein See admin instructions. Dilute 2mg  of lorazepam  with 1mL of saline.  Administer IV push over 3-5 minutes as needed for seizures. 1 mL 5   montelukast  (SINGULAIR ) 10 MG tablet Take 10 mg by mouth at bedtime.  6   Multiple Vitamin (MULTIVITAMIN WITH MINERALS) TABS Take 1 tablet by mouth daily.     Omega-3  Fatty Acids (FISH OIL) 500 MG CAPS Take 500 mg by mouth daily.     ondansetron  (ZOFRAN -ODT) 4 MG disintegrating tablet Take 1 tablet (4 mg total) by mouth every 8 (eight) hours as needed for nausea or vomiting. 30 tablet 0   [name]  10 MG tablet TAKE 1 TABLET BY MOUTH IN THE MORNING, AND 2 AT BEDTIME 90 tablet 3   PHENObarbital  (LUMINAL) 65 MG/ML injection Withdraw 1ml (65mg ) Phenobarbital  and 4ml Normal Saline into 5ml syringe. Adminster IV push for 3-5 minutes as needed for recurrent seizures 5 mL 5   promethazine  (PHENERGAN ) 25 MG suppository Place 25 mg rectally every 6 (six) hours as needed for nausea or vomiting.     rosuvastatin  (CRESTOR ) 10 MG tablet Take 10 mg by mouth at bedtime.  5   TROKENDI  XR 50 MG CP24 Take 1 capsule at nighttime 31 capsule 5   VIMPAT  100 MG TABS TAKE 1 TABLET IN THE MORNING AND TAKE 2 TABLETS AT NIGHT 270 tablet 5   Vitamin D , Ergocalciferol , (DRISDOL ) 1.25 MG (50000 UNIT) CAPS capsule TAKE 1 CAPSULE (50,000 UNITS TOTAL) BY MOUTH EVERY 7 (SEVEN) DAYS 12 capsule 0   Topiramate  ER (TROKENDI  XR) 200 MG CP24 TAKE 1 CAPSULE BY MOUTH AT BEDTIME. 90 capsule 3   terbinafine (LAMISIL) 250 MG tablet Take 250 mg by mouth daily.     No facility-administered medications prior to visit.    PAST MEDICAL HISTORY: Past Medical History:  Diagnosis Date   Afib (HCC)    only per portocath removal.   ALLERGIC RHINITIS 12/26/2009   Atrial fibrillation (HCC) 04/08/2012   Formatting of this note might be different from the original.  Last Assessment & Plan:   One episode during a procedure in July. Likely induced by the wire. No recurrence. He is in normal sinus rhythm today. No further workup or treatment.   Overview:   Paroxysmal - occurred during a procedure - has been anticoagulated with Coumadin  but initiated due to DVT  Formatting of this note might be differ   Blood clot in vein 2015   Brown's syndrome    Complication of anesthesia    seizure after pac surgery at [facility] 2013 had  to be intubated, no other anes issurs   Growth disorder    Headache    migraines   Heart murmur    moderate MR ses dr Albina Hull   Hyperlipidemia    No therapy   Hypokalemia    Mental retardation, mild (I.Q. 50-70)  MITRAL VALVE PROLAPSE 06/22/2007   SEIZURE DISORDER 06/22/2007   last seizure 08-02-2019   Sleep apnea    cpap , last sleep study 10/01/11   Unspecified hypothyroidism 06/22/2007    PAST SURGICAL HISTORY: Past Surgical History:  Procedure Laterality Date   ANKLE SURGERY Left    fused does not bend, wears right leg brace 2018 at [facility]   BIOPSY  08/22/2019   Procedure: BIOPSY;  Surgeon: Sergio Dandy, MD;  Location: [facility];  Service: Endoscopy;;   BIOPSY  10/02/2022   Procedure: BIOPSY;  Surgeon: Sergio Dandy, MD;  Location: [facility];  Service: Gastroenterology;;   CATARACT EXTRACTION     2021   COLONOSCOPY WITH PROPOFOL  N/A 08/22/2019   Procedure: COLONOSCOPY WITH PROPOFOL ;  Surgeon: Sergio Dandy, MD;  Location: [facility];  Service: Endoscopy;  Laterality: N/A;   COLONOSCOPY WITH PROPOFOL  N/A 10/02/2022   Procedure: COLONOSCOPY WITH PROPOFOL ;  Surgeon: Sergio Dandy, MD;  Location: [facility];  Service: Gastroenterology;  Laterality: N/A;   EYE SURGERY     X2 for Brown Syndrome   HEMOSTASIS CLIP PLACEMENT  08/22/2019   Procedure: HEMOSTASIS CLIP PLACEMENT;  Surgeon: Sergio Dandy, MD;  Location: [facility];  Service: Endoscopy;;   IMPLANTATION VAGAL NERVE STIMULATOR     Left leaves on for surgeries, new battery 2022, first put in 2005.   POLYPECTOMY  08/22/2019   Procedure: POLYPECTOMY;  Surgeon: Sergio Dandy, MD;  Location: [facility];  Service: Endoscopy;;   PORTACATH PLACEMENT  2013 and removed and new placed   right chest    FAMILY HISTORY: Family History  Problem Relation Age of Onset   Breast cancer Mother    Colon polyps Mother    Clotting disorder Mother    Diabetes Father    Hypertension  Father    Colon polyps Father    Hyperlipidemia Father    Atrial fibrillation Father    Diabetes Maternal Grandmother    Heart disease Maternal Grandmother    Diabetes Maternal Grandfather    Heart disease Maternal Grandfather    Coronary artery disease Maternal Grandfather    Congestive Heart Failure Maternal Grandfather        Died at 65   Pneumonia Maternal Grandfather    Diabetes Paternal Grandmother    Heart disease Paternal Grandmother    Pneumonia Paternal Grandmother        Died at 23   Stroke Paternal Grandmother    Diabetes Paternal Grandfather    Heart disease Paternal Grandfather    Coronary artery disease Paternal Grandfather    Lung cancer Paternal Grandfather        Died at 40    SOCIAL HISTORY: Social History   Socioeconomic History   Marital status: Single    Spouse name: Not on file   Number of children: 0   Years of education: Not on file   Highest education level: Not on file  Occupational History   Occupation: Disabled    Employer: UNEMPLOYED  Tobacco Use   Smoking status: Never    Passive exposure: Never   Smokeless tobacco: Never  Vaping Use   Vaping status: Never Used  Substance and Sexual Activity   Alcohol use: No    Alcohol/week: 0.0 standard drinks of alcohol   Drug use: No   Sexual activity: Never  Other Topics Concern   Not on file  Social History Narrative   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   He is currently  not attending a day program and is not employed. Disabled.    He lives with his parents.    He enjoys helping around the house, Nascar, and football.   Caffiene soda 's with caffeien 2-3 / day   Social Drivers of Health   Financial Resource Strain: Low Risk  (11/25/2022)   Received from [facility], [facility]   Overall Financial Resource Strain (CARDIA)    Difficulty of Paying Living Expenses: Not hard at all  Food Insecurity: No Food Insecurity (11/25/2022)   Received from [facility], [facility]   Hunger  Vital Sign    Worried About Running Out of Food in the Last Year: Never true    Ran Out of Food in the Last Year: Never true  Transportation Needs: No Transportation Needs (11/25/2022)   Received from [facility], [facility]   PRAPARE - Transportation    Lack of Transportation (Medical): No    Lack of Transportation (Non-Medical): No  Physical Activity: Not on file  Stress: Not on file  Social Connections: Unknown (01/21/2022)   Received from [facility], [facility]   Social Network    Social Network: Not on file  Intimate Partner Violence: Patient Declined (06/01/2022)   Humiliation, Afraid, Rape, and Kick questionnaire    Fear of Current or Ex-Partner: Patient declined    Emotionally Abused: Patient declined    Physically Abused: Patient declined    Sexually Abused: Patient declined    PHYSICAL EXAM  GENERAL EXAM/CONSTITUTIONAL: Vitals:  Vitals:   03/01/24 1500  BP: 102/67  Pulse: 88  SpO2: 96%  Weight: 122 lb 8 oz (55.6 kg)  Height: 5' 3 (1.6 m)   Body mass index is 21.7 kg/m. Wt Readings from Last 3 Encounters:  03/01/24 122 lb 8 oz (55.6 kg)  02/15/24 135 lb (61.2 kg)  12/08/23 131 lb (59.4 kg)   Patient is in no distress; well developed, nourished and groomed; neck is supple  MUSCULOSKELETAL: Gait, strength, tone, movements noted in Neurologic exam below  NEUROLOGIC: MENTAL STATUS:      No data to display         awake, alert, oriented to person Answering appropriately Able to follow commands and direction  Able to track examiner  Normal bulk and tone, he does have contraction  Sensory normal to light touch Clumsy gait and poor balance, he does need assistance with ambulation     DIAGNOSTIC DATA (LABS, IMAGING, TESTING) - I reviewed patient records, labs, notes, testing and imaging myself where available.  Lab Results  Component Value Date   WBC 5.3 02/28/2024   HGB 13.0 02/28/2024   HCT 40.1 02/28/2024   MCV 86.2 02/28/2024   PLT  241 02/28/2024      Component Value Date/Time   NA 139 02/28/2024 1550   NA 139 12/22/2023 0836   K 3.7 02/28/2024 1550   CL 105 02/28/2024 1550   CO2 20 (L) 02/28/2024 1550   GLUCOSE 89 02/28/2024 1550   BUN 14 02/28/2024 1550   BUN 13 12/22/2023 0836   CREATININE 0.91 02/28/2024 1550   CALCIUM  8.8 (L) 02/28/2024 1550   PROT 6.4 12/22/2023 0836   ALBUMIN 4.1 12/22/2023 0836   AST 14 12/22/2023 0836   ALT 12 12/22/2023 0836   ALKPHOS 80 12/22/2023 0836   BILITOT 0.3 12/22/2023 0836   GFRNONAA >60 02/28/2024 1550   GFRAA >60 12/06/2019 1158   Lab Results  Component Value Date   TRIG 187 (H) 02/13/2018  No results found for: HGBA1C No results found for: VITAMINB12 Lab Results  Component Value Date   TSH 0.040 (L) 12/22/2023    CT Head 11/19/2022 1. 4 mm thick hypodense subdural collection over the left cerebral convexity without mass effect or midline shift is likely subacute to chronic. No evidence of acute blood products. 2. Otherwise, no evidence of acute intracranial pathology. 3. Unchanged dense basal ganglia calcifications   EEG 11/20/22: - Intermittent slow, generalized and maximal left temporal    ASSESSMENT AND PLAN  38 y.o. year old male  with past medical history of intractable epilepsy status post VNS placement, intellectual disability, obstructive sleep apnea on CPAP, hypothyroidism, chronic migraines who is presenting for follow up for his intractable epilepsy, possibly Lennox-Gastaut syndrome.  He continues to have 2-3 seizures per month. Plan will be to increase his Trokendi  to 200 mg twice daily, continue with Levetiracetam  to 1500 mg twice daily, continue with Tegretol  150 mg three times daily and Vimpat  100 mg in the morning and 200 mg at night.   Advised family to contact me for any seizure or seizure activity.  Will arrange for EMU admission in October for medication optimization.    1. 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 S/P placement of VNS (vagus nerve stimulation) device   4. Severe obstructive sleep apnea   5. Chronic migraine without aura without status migrainosus, not intractable   6. Partial epilepsy with intractable epilepsy [facility])       Patient Instructions  Increase Trokendi  to 200 mg twice daily Continue levetiracetam  1500 mg twice daily Continue with Tegretol  150 mg 3 times daily Continue with Vimpat  100 mg in the morning and 200 mg at night Continue with clobazam  10 mg in the morning and 20 mg at night We will give him Valtoco  as rescue medication Start Qulipta 60 mg nightly as migraine prevention Use Zavzpret as needed for migraines Follow-up as scheduled in September Please call us  if any side effect of the medication or if any increased seizures.   Per [place]  DMV statutes, patients with seizures are not allowed to drive until they have been seizure-free for six months.  Other recommendations include using caution when using heavy equipment or power tools. Avoid working on ladders or at heights. Take showers instead of baths.  Do not swim alone.  Ensure the water  temperature is not too high on the home water  heater. Do not go swimming alone. Do not lock yourself in a room alone (i.e. bathroom). When caring for infants or small children, sit down when holding, feeding, or changing them to minimize risk of injury to the child in the event you have a seizure. Maintain good sleep hygiene. Avoid alcohol.  Also recommend adequate sleep, hydration, good diet and minimize stress.   During the Seizure  - First, ensure adequate ventilation and place patients on the floor on their left side  Loosen clothing around the neck and ensure the airway is patent. If the patient is clenching the teeth, do not force the mouth open with any object as this can cause severe damage - Remove all items from the surrounding  that can be hazardous. The patient may be oblivious to what's happening and may not even know what he or she is doing. If the patient is confused and wandering, either gently guide him/her away and block access to outside areas - Reassure the individual and  be comforting - Call 911. In most cases, the seizure ends before EMS arrives. However, there are cases when seizures may last over 3 to 5 minutes. Or the individual may have developed breathing difficulties or severe injuries. If a pregnant patient or a person with diabetes develops a seizure, it is prudent to call an ambulance. - Finally, if the patient does not regain full consciousness, then call EMS. Most patients will remain confused for about 45 to 90 minutes after a seizure, so you must use judgment in calling for help. - Avoid restraints but make sure the patient is in a bed with padded side rails - Place the individual in a lateral position with the neck slightly flexed; this will help the saliva drain from the mouth and prevent the tongue from falling backward - Remove all nearby furniture and other hazards from the area - Provide verbal assurance as the individual is regaining consciousness - Provide the patient with privacy if possible - Call for help and start treatment as ordered by the caregiver   After the Seizure (Postictal Stage)  After a seizure, most patients experience confusion, fatigue, muscle pain and/or a headache. Thus, one should permit the individual to sleep. For the next few days, reassurance is essential. Being calm and helping reorient the person is also of importance.  Most seizures are painless and end spontaneously. Seizures are not harmful to others but can lead to complications such as stress on the lungs, brain and the heart. Individuals with prior lung problems may develop labored breathing and respiratory distress.     No orders of the defined types were placed in this encounter.   Meds ordered this  encounter  Medications   diazePAM , 20 MG Dose, (VALTOCO  20 MG DOSE) 2 x 10 MG/0.1ML LQPK    Sig: Place 20 mg into the nose as needed (for seizure lasting more than 2 minutes).    Dispense:  5 each    Refill:  5    Please provide 5 boxes for a total of 10 doses   Zavegepant HCl (ZAVZPRET) 10 MG/ACT SOLN    Sig: Place 10 mg into the nose as needed (Migraines).    Dispense:  6 each    Refill:  6   Atogepant (QULIPTA) 60 MG TABS    Sig: Take 1 tablet (60 mg total) by mouth daily at 12 noon.    Dispense:  30 tablet    Refill:  11   Topiramate  ER (TROKENDI  XR) 200 MG CP24    Sig: Take 1 capsule (200 mg total) by mouth in the morning and at bedtime.    Dispense:  90 capsule    Refill:  3    The patient has updated the prescriber from the original prescriber    No follow-ups on file.  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  This provider is the continuing focal point for all needed services for this condition.   Cassandra Cleveland, MD 03/02/2024, 8:17 AM  [facility] [address], [address] [place], [place] [address] [phone]

## 2024-03-02 ENCOUNTER — Telehealth: Payer: Self-pay

## 2024-03-02 MED ORDER — TOPIRAMATE ER 200 MG PO CAP24
1.0000 | ORAL_CAPSULE | Freq: Two times a day (BID) | ORAL | 3 refills | Status: DC
Start: 2024-03-02 — End: 2024-06-27

## 2024-03-02 MED ORDER — QULIPTA 60 MG PO TABS: 60.0000 mg | ORAL_TABLET | Freq: Every day | ORAL | 11 refills | Status: AC

## 2024-03-02 NOTE — Telephone Encounter (Signed)
 Call to mother, she is in agreement to with to trokendi  200mg  twice daily. She reports enough phenobarbital  and aware ativan  discontinued. Also aware, that qulipta, zavspret and valtoco  sent in. She is wanting  to make sure they can go to hospital in early October for mediation reduction. Advised I will send to Dr. Samara Crest to make aware but EMU does own scheduling.

## 2024-03-02 NOTE — Patient Instructions (Signed)
 Increase Trokendi  to 200 mg twice daily Continue levetiracetam  1500 mg twice daily Continue with Tegretol  150 mg 3 times daily Continue with Vimpat  100 mg in the morning and 200 mg at night Continue with clobazam  10 mg in the morning and 20 mg at night We will give him Valtoco  as rescue medication Start Qulipta 60 mg nightly as migraine prevention Use Zavzpret as needed for migraines Follow-up as scheduled in September Please call us  if any side effect of the medication or if any increased seizures.

## 2024-03-07 ENCOUNTER — Encounter: Payer: Self-pay | Admitting: Neurology

## 2024-03-07 DIAGNOSIS — G40319 Generalized idiopathic epilepsy and epileptic syndromes, intractable, without status epilepticus: Secondary | ICD-10-CM

## 2024-03-07 MED ORDER — ONFI 10 MG PO TABS: ORAL_TABLET | ORAL | 3 refills | Status: DC

## 2024-03-07 NOTE — Telephone Encounter (Signed)
 Needs filled for vacation Requested Prescriptions   Pending Prescriptions Disp Refills   [name]  10 MG tablet 90 tablet 3    Sig: TAKE 1 TABLET BY MOUTH IN THE MORNING, AND 2 AT BEDTIME   Last seen 03/01/24, next appt 06/09/24 Dispenses   Dispensed Days Supply Quantity Provider Pharmacy  [name]  10 MG TABLET 02/17/2024 30 90 each Camara, Amadou, MD [facility] [phone] - G...  [name]  10 MG TABLET 01/15/2024 30 90 each Cassandra Cleveland, MD [facility] [phone] - G...  [name]  10 MG TABLET 12/17/2023 30 90 each Camara, Amadou, MD [facility] [phone] - G...  [name]  10 MG TABLET 11/17/2023 30 90 each Camara, Amadou, MD [facility] [phone] - G...  [name]  10 MG TABLET 10/19/2023 30 90 each Cassandra Cleveland, MD [facility] [phone] - G...  [name]  10 MG TABLET 09/19/2023 30 90 each Camara, Amadou, MD [facility] [phone] - G...  [name]  10 MG TABLET 08/22/2023 30 90 each Camara, Amadou, MD [facility] [phone] - G...  [name]  10 MG TABLET 07/20/2023 30 90 each Camara, Amadou, MD [facility] [phone] - G...  [name]  10 MG TABLET 06/16/2023 30 90 each Camara, Amadou, MD [facility] [phone] - G...  [name]  10 MG TABLET 05/04/2023 30 120 each Ilona Malta, MD [facility] [phone] - G...  [name]  10 MG TABLET 04/03/2023 30 120 each Ilona Malta, MD [facility] [phone] - G...  [name]  10 MG TABLET 03/06/2023 30 120 each Ilona Malta, MD [facility] [phone] - G.Aaron AasAaron Aas

## 2024-03-10 ENCOUNTER — Telehealth: Payer: Self-pay

## 2024-03-10 NOTE — Telephone Encounter (Signed)
 Pt's mother has asked for an update on the request for pt's [name] , call connected to POD 2

## 2024-03-10 NOTE — Telephone Encounter (Signed)
 Call from mom, needing early refill of [name]  due to going on vacation. Sent to Dr. Samara Crest and called pharmacy to authorize, spoke with Burdette Carolin at pharmacy. Call back to mom and made aware

## 2024-03-28 ENCOUNTER — Telehealth: Payer: Self-pay | Admitting: Neurology

## 2024-03-28 NOTE — Telephone Encounter (Signed)
 Please have them hold the qulipta  for about a week to see if there are any changes. Thanks

## 2024-03-28 NOTE — Telephone Encounter (Signed)
 Call to mom, reviewed Dr. Gregg recommendations and mom in agreement

## 2024-03-28 NOTE — Telephone Encounter (Signed)
 Pt's mother called stating that since pt started taking the TROKENDI  XR 50 MG CP24 and the Atogepant  (QULIPTA ) 60 MG TABS in the morning pt seems to be getting more migraines. Please call to discuss.

## 2024-03-28 NOTE — Telephone Encounter (Signed)
 Call to mom, she reports since increase in trokendi  and adding Qulipta , patient seems to have more migraines and also has started having diarrhea. She reports they have used the Zavspret 4x since middle of June for migraine relief. Advised I would send to Dr. Gregg for advice and recommendations.

## 2024-04-04 ENCOUNTER — Telehealth: Payer: Self-pay

## 2024-04-05 ENCOUNTER — Other Ambulatory Visit (HOSPITAL_COMMUNITY): Payer: Self-pay

## 2024-04-05 ENCOUNTER — Telehealth: Payer: Self-pay

## 2024-04-05 NOTE — Telephone Encounter (Signed)
 Routed message to Dr. gregg

## 2024-04-05 NOTE — Telephone Encounter (Signed)
 Pharmacy Patient Advocate Encounter  Received notification from [facility] that Prior Authorization for Vimpat  100MG  tablets has been APPROVED from 04/05/2024 to 04/05/2025. Unable to obtain price due to refill too soon rejection, last fill date 04/02/2024 next available fill date8/01/2024   PA #/Case ID/Reference #: PA Case ID #: [id number]

## 2024-04-05 NOTE — Telephone Encounter (Signed)
 Pharmacy Patient Advocate Encounter   Received notification from Physician's Office that prior authorization for Vimpat  100MG  tablets is required/requested.   Insurance verification completed.   The patient is insured through [facility] .   Per test claim: PA required; PA submitted to above mentioned insurance via CoverMyMeds Key/confirmation #/EOC [id number] Status is pending

## 2024-04-19 ENCOUNTER — Other Ambulatory Visit: Payer: Self-pay | Admitting: Neurology

## 2024-04-22 ENCOUNTER — Other Ambulatory Visit: Payer: Self-pay | Admitting: Neurology

## 2024-04-22 DIAGNOSIS — E876 Hypokalemia: Secondary | ICD-10-CM

## 2024-04-26 ENCOUNTER — Telehealth: Payer: Self-pay

## 2024-04-26 ENCOUNTER — Other Ambulatory Visit (HOSPITAL_COMMUNITY): Payer: Self-pay

## 2024-04-26 ENCOUNTER — Telehealth: Payer: Self-pay | Admitting: Neurology

## 2024-04-26 NOTE — Telephone Encounter (Signed)
 Pharmacy Patient Advocate Encounter   Received notification from Physician's Office that prior authorization for Topiramate  ER 200MG  er capsules is required/requested.   Insurance verification completed.   The patient is insured through [facility] .   Per test claim: PA required; PA submitted to above mentioned insurance via CoverMyMeds Key/confirmation #/EOC [id number] Status is pending

## 2024-04-26 NOTE — Telephone Encounter (Signed)
 Pt mother called due to receiving letter from Pt insurance about needing update  for Pt medication in order for Insurance to pay for pt medication . Pt insurance is requesting an exceptional approval for medication  Also they are requesting an update on Pt  as well  Pt mother gave number to callback  for approval Phone #[phone]   Topiramate  ER (TROKENDI  XR) 200 MG CP24 [name]  10 MG tablet

## 2024-04-26 NOTE — Telephone Encounter (Signed)
 Pharmacy Patient Advocate Encounter  Received notification from [facility] that Prior Authorization for Topiramate  ER 200MG  er capsules has been APPROVED from 04/26/2024 to 04/26/2025   PA #/Case ID/Reference #: PA Case ID #: [id number]

## 2024-04-26 NOTE — Telephone Encounter (Signed)
 Pharmacy Patient Advocate Encounter   Received notification from Physician's Office that prior authorization for [name]  10MG  tablets is required/requested.   Insurance verification completed.   The patient is insured through [facility] .   Per test claim: PA required; PA submitted to above mentioned insurance via CoverMyMeds Key/confirmation #/EOC [id number] Status is pending

## 2024-04-26 NOTE — Telephone Encounter (Signed)
 Pharmacy Patient Advocate Encounter  Received notification from [facility] that Prior Authorization for [name]  10MG  tablets has been APPROVED from 04/26/2024 to 04/26/2025. Unable to obtain price due to refill too soon rejection, last fill date 04/12/2024 next available fill date8/15/2025   PA #/Case ID/Reference #: PA Case ID #: [id number]

## 2024-05-10 ENCOUNTER — Telehealth: Payer: Self-pay

## 2024-05-10 ENCOUNTER — Other Ambulatory Visit (HOSPITAL_COMMUNITY): Payer: Self-pay

## 2024-05-10 NOTE — Telephone Encounter (Signed)
 Pharmacy Patient Advocate Encounter   Received notification from Patient Advice Request messages that prior authorization for Zavzpret  is required/requested.   Insurance verification completed.   The patient is insured through [facility] .   Per test claim: PA required; PA submitted to above mentioned insurance via Latent Key/confirmation #/EOC [id number] Status is pending

## 2024-05-10 NOTE — Telephone Encounter (Signed)
 Pharmacy Patient Advocate Encounter  Received notification from [facility] that Prior Authorization for Zavzpret  has been APPROVED from 05/10/2024 to 05/10/2025. Unable to obtain price due to refill too soon rejection, last fill date [id number] next available fill date8/26/2025   PA #/Case ID/Reference #: [id number]

## 2024-05-19 ENCOUNTER — Encounter: Payer: Self-pay | Admitting: *Deleted

## 2024-05-19 ENCOUNTER — Telehealth: Payer: Self-pay

## 2024-05-19 NOTE — Telephone Encounter (Signed)
 Faxed medication to [facility], from [place]

## 2024-05-24 ENCOUNTER — Other Ambulatory Visit: Payer: Self-pay

## 2024-05-24 DIAGNOSIS — E876 Hypokalemia: Secondary | ICD-10-CM

## 2024-05-24 MED ORDER — K-PHOS 500 MG PO TABS: 500.0000 mg | ORAL_TABLET | Freq: Four times a day (QID) | ORAL | 5 refills | Status: AC

## 2024-06-09 ENCOUNTER — Ambulatory Visit: Admitting: Neurology

## 2024-06-09 ENCOUNTER — Encounter: Payer: Self-pay | Admitting: Neurology

## 2024-06-09 VITALS — BP 116/72 | HR 90 | Ht 63.0 in | Wt 129.0 lb

## 2024-06-09 DIAGNOSIS — Z9689 Presence of other specified functional implants: Secondary | ICD-10-CM | POA: Diagnosis not present

## 2024-06-09 DIAGNOSIS — G40813 Lennox-Gastaut syndrome, intractable, with status epilepticus: Secondary | ICD-10-CM | POA: Diagnosis not present

## 2024-06-09 DIAGNOSIS — G43709 Chronic migraine without aura, not intractable, without status migrainosus: Secondary | ICD-10-CM

## 2024-06-09 DIAGNOSIS — G4733 Obstructive sleep apnea (adult) (pediatric): Secondary | ICD-10-CM | POA: Diagnosis not present

## 2024-06-09 MED ORDER — TOPIRAMATE ER 100 MG PO CAP24: 100.0000 mg | ORAL_CAPSULE | Freq: Every evening | ORAL | 3 refills | Status: AC

## 2024-06-09 NOTE — Progress Notes (Addendum)
 [facility]  PATIENT: [name] DOB: 03/30/1986  REQUESTING CLINICIAN: Sophronia Ozell BROCKS, MD HISTORY FROM: Mother, sister and chart review  REASON FOR VISIT: Transitioning from peds to adult. Epilepsy management.    HISTORICAL  CHIEF COMPLAINT:  Chief Complaint  Patient presents with   Follow-up    Pt in room 12. Parent in room. Here for Seizure/migraine follow up.   INTERVAL HISTORY 06/09/2024 [name] presented for follow-up, he is accompanied by parents.  Last visit was in June at that time we continued his medications but increase his Trokendi  to 200 mg twice daily.  Since then, he did have an improvement in his seizures.  Had 1 seizure in July and 1 seizure in September where he used to have 2-3 seizures per month.  They also report that his headaches have improved, Zavzpret  has been helpful in controlling the headaches.  He has not used the Valtoco . Per family, in the past with his seizure, his oxygen Saturation will drop to about 75-80% while the oxygen is set on 10.    INTERVAL HISTORY 03/01/2024 [name] present today for follow-up, he is accompanied by both parents.  Last visit was in March, at that time we increase his Tegretol  to 200 mg TID and continued his medications but he had dizziness, so family decided to reduce the Tegretol  to 150 mg TID.  He continued to have seizures, on average 2 to 3/month.  Last week he did have a cluster of seizures due to migraines, vomiting, not able to hold his antiseizure medications.  He ended going to the hospital and receiving migraine cocktail and IV fluid for dehydration. When he comes to the seizure, mother has told me the patient has tried ketogenic diet in the past which was not successful, was on Epidiolex  caused him dizziness so it was not maximized.    INTERVAL HISTORY 12/08/2023:  [name] presents today for follow-up, he is accompanied by mother and sister.  Last visit was in July, at that time we  continued current medications.  They tells me that he continues to have seizures, 1 every 2 to 3 weeks.  Family reports the seizures are mainly related to sleep, whenever falling asleep or waking up from sleep.  He is compliant with his medications, denies any side effect. They are asking for a prescription for his O2 supplement sent to [facility] (Prescription previously sent by Dr. Susen)    INTERVAL HISTORY 04/08/2023:  [name] presents for follow-up, he is accompanied by mother and father.  Last visit was in May at that time we started him on Epidiolex  but it caused side effects of dizziness, gait instability, and somnolence.  He could not walk.  Due to these side effects, Epidiolex  was discontinued.  While on Epidiolex  he continued to have seizures 2 to 3/month.  His seizure frequency has not changed again 2 to 3/month.  He is compliant with his medications, does not swipe his VNS multiple times per day.  Family do not have any major concerns currently.   HISTORY OF PRESENT ILLNESS:  This is a 38 year old gentleman with multiple medical conditions including intellectual disability, intractable epilepsy status post VNS placement, hypothyroidism, hormone deficiency, obstructive sleep apnea on CPAP who is presenting to establish care.  Patient was initially previously managed by pediatric neurology and Dr[facility] Donice at [facility] but now he needs a local adult neurologist.   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  6 months later he started having his first seizure.  Since then his seizures have been intractable.  He had multiple type of  seizures including tonic seizures, bilateral tonic-clonic seizures, focal sensory seizures where he is aware, atonic seizure where he will fall and staring seizures.  He has tried multiple medication listed below.   Currently he is on the combination on 5 antiseizure medications including  Tegretol  150 mg 3 times daily, clobazam  10/10 and 20 mg at bedtime, Vimpat  100 mg in the morning 200 at night, Keppra  1000 mg in the morning and 1250 in the evening and Trokendi  250 at bedtime.  Family reports that his seizures remain intractable despite VNS therapy.  He continued to have 2-3 seizures a month.  He was admitted last in the hospital in February for seizures and his last seizure was on May 13.  Family has reported prolonged seizure where he will need CPR, he also had status where he was intubated but currently they feel like the seizures are stable not getting worse even though he is getting 2 to 3/month.  Denies any history of birth trauma and he did not stay in the ICU as a baby. They could not related any seizure risk factors.    Handedness: Right   Onset: At the age of 7  Seizure Type: Tonic seizures, bilateral tonic clonic seizures, atonic seizures (drop seizure), focal sensory seizures and staring seizures  Current frequency: Last seizures 5/13, 2 to 3 seizures a months   Any injuries from seizures: Fall, concussion   Seizure risk factors: None reported   Previous ASMs: Gabapentin, Tegretol , Lamotrigine, Felbatol, Depakote, Topamax , Keppra , [name] , Phenobarbital , Lacosamide , Phenytoin  Currenty ASMs: Tegretol  150 mg TID, Clobazam  10/20 mg, Vimpat  100/200 mg, Keppra  1500 mg BID, Trokendi  200 mg BID   ASMs side effects: Allergic reaction to Phenytoin and Dilantin   Brain Images: No intracranial pathology. Last CT with subdural collection   Previous EEGs: Diffuse slow, focal left slowing   OTHER MEDICAL CONDITIONS: Intellectual disability, Intractable seizures s/p VNS placement, OSA on CPAP, Hypothyroidism   REVIEW OF SYSTEMS: Full 14 system review of systems performed and negative with exception of: As noted in the HPI   ALLERGIES: Allergies  Allergen Reactions   Antihistamines, Chlorpheniramine-Type Other (See Comments)    Other Reaction: Dimetapp causes  seizures  Allergen- Dimetapp, Reaction - seizures   Divalproex Sodium Other (See Comments)    Nausea, vomiting, liver and kidney dysfunction   Phenytoin Rash and Other (See Comments)    Dilantin causes more seizures.    Valproic Acid And Related Other (See Comments)    Shuts down systems   Vancomycin  Other (See Comments), Rash and Swelling    If given too fast, causes red man reaction  Other Reaction: Redman's Syndrome  Other Reaction(s): Angioedema   Antihistamines, Diphenhydramine-Type Other (See Comments)    seizures   Phenytoin Sodium Extended Swelling    Enhances seizures  Other Reaction(s): Angioedema   Valproic Acid Other (See Comments)    Shuts down systems  Other Reaction(s): Angioedema  Starts to shut down body functions   Divalproex Sodium Rash    Nausea, vomiting, liver and kidney dysfunction    HOME MEDICATIONS: Outpatient Medications Prior to Visit  Medication Sig Dispense Refill   albuterol  (ACCUNEB ) 1.25 MG/3ML nebulizer solution Take 1 ampule by nebulization every 4 (four) hours as needed for wheezing.     Calcium -Vitamin D -Vitamin K (VIACTIV CALCIUM  PLUS  D) 650-12.5-40 MG-MCG-MCG CHEW Chew 1 each by mouth daily.     carbamazepine  (TEGRETOL ) 100 MG chewable tablet CHEW 1.5 TABLETS (150 MG TOTAL) BY MOUTH 3 (THREE) TIMES DAILY. 405 tablet 1   cetirizine (ZYRTEC) 10 MG tablet Take 10 mg by mouth daily.     diazePAM , 20 MG Dose, (VALTOCO  20 MG DOSE) 2 x 10 MG/0.1ML LQPK Place 20 mg into the nose as needed (for seizure lasting more than 2 minutes). 5 each 5   Flaxseed, Linseed, (FLAXSEED OIL) 1000 MG CAPS Take 1,000 mg by mouth daily.     K-PHOS  500 MG tablet Take 1 tablet (500 mg total) by mouth 4 (four) times daily. 124 tablet 5   KLOR-CON  M20 20 MEQ tablet TAKE 1 TABLET BY MOUTH EVERY DAY 90 tablet 2   levETIRAcetam  (KEPPRA ) 500 MG tablet TAKE 3 TABLETS (1,500 MG TOTAL) BY MOUTH IN THE MORNING AND AT BEDTIME. 540 tablet 3   levothyroxine  (SYNTHROID ) 75 MCG  tablet Take 75 mcg by mouth daily before breakfast.     montelukast  (SINGULAIR ) 10 MG tablet Take 10 mg by mouth at bedtime.  6   Multiple Vitamin (MULTIVITAMIN WITH MINERALS) TABS Take 1 tablet by mouth daily.     Omega-3 Fatty Acids (FISH OIL) 500 MG CAPS Take 500 mg by mouth daily.     ondansetron  (ZOFRAN -ODT) 4 MG disintegrating tablet Take 1 tablet (4 mg total) by mouth every 8 (eight) hours as needed for nausea or vomiting. 30 tablet 0   [name]  10 MG tablet TAKE 1 TABLET BY MOUTH IN THE MORNING, AND 2 AT BEDTIME 90 tablet 3   PHENObarbital  (LUMINAL) 65 MG/ML injection Withdraw 1ml (65mg ) Phenobarbital  and 4ml Normal Saline into 5ml syringe. Adminster IV push for 3-5 minutes as needed for recurrent seizures 5 mL 5   promethazine  (PHENERGAN ) 25 MG suppository Place 25 mg rectally every 6 (six) hours as needed for nausea or vomiting.     rosuvastatin  (CRESTOR ) 10 MG tablet Take 10 mg by mouth at bedtime.  5   terbinafine (LAMISIL) 250 MG tablet Take 250 mg by mouth daily.     Topiramate  ER (TROKENDI  XR) 200 MG CP24 Take 1 capsule (200 mg total) by mouth in the morning and at bedtime. 90 capsule 3   VIMPAT  100 MG TABS TAKE 1 TABLET IN THE MORNING AND TAKE 2 TABLETS AT NIGHT 270 tablet 5   Zavegepant HCl (ZAVZPRET ) 10 MG/ACT SOLN Place 10 mg into the nose as needed (Migraines). 6 each 6   Atogepant  (QULIPTA ) 60 MG TABS Take 1 tablet (60 mg total) by mouth daily at 12 noon. (Patient not taking: Reported on 06/09/2024) 30 tablet 11   Vitamin D , Ergocalciferol , (DRISDOL ) 1.25 MG (50000 UNIT) CAPS capsule TAKE 1 CAPSULE (50,000 UNITS TOTAL) BY MOUTH EVERY 7 (SEVEN) DAYS (Patient not taking: Reported on 06/09/2024) 12 capsule 0   No facility-administered medications prior to visit.    PAST MEDICAL HISTORY: Past Medical History:  Diagnosis Date   Afib (HCC)    only per portocath removal.   ALLERGIC RHINITIS 12/26/2009   Atrial fibrillation (HCC) 04/08/2012   Formatting of this note might be  different from the original.  Last Assessment & Plan:   One episode during a procedure in July. Likely induced by the wire. No recurrence. He is in normal sinus rhythm today. No further workup or treatment.   Overview:   Paroxysmal - occurred during a procedure - has been anticoagulated with Coumadin  but  initiated due to DVT  Formatting of this note might be differ   Blood clot in vein 2015   Brown's syndrome    Complication of anesthesia    seizure after pac surgery at [facility] 2013 had to be intubated, no other anes issurs   Growth disorder    Headache    migraines   Heart murmur    moderate MR ses dr nicanor   Hyperlipidemia    No therapy   Hypokalemia    Mental retardation, mild (I.Q. 50-70)    MITRAL VALVE PROLAPSE 06/22/2007   SEIZURE DISORDER 06/22/2007   last seizure 08-02-2019   Sleep apnea    cpap , last sleep study 10/01/11   Unspecified hypothyroidism 06/22/2007    PAST SURGICAL HISTORY: Past Surgical History:  Procedure Laterality Date   ANKLE SURGERY Left    fused does not bend, wears right leg brace 2018 at [facility]   BIOPSY  08/22/2019   Procedure: BIOPSY;  Surgeon: Shila Gustav GAILS, MD;  Location: [facility];  Service: Endoscopy;;   BIOPSY  10/02/2022   Procedure: BIOPSY;  Surgeon: Shila Gustav GAILS, MD;  Location: [facility];  Service: Gastroenterology;;   CATARACT EXTRACTION     2021   COLONOSCOPY WITH PROPOFOL  N/A 08/22/2019   Procedure: COLONOSCOPY WITH PROPOFOL ;  Surgeon: Shila Gustav GAILS, MD;  Location: [facility];  Service: Endoscopy;  Laterality: N/A;   COLONOSCOPY WITH PROPOFOL  N/A 10/02/2022   Procedure: COLONOSCOPY WITH PROPOFOL ;  Surgeon: Shila Gustav GAILS, MD;  Location: [facility];  Service: Gastroenterology;  Laterality: N/A;   EYE SURGERY     X2 for Brown Syndrome   HEMOSTASIS CLIP PLACEMENT  08/22/2019   Procedure: HEMOSTASIS CLIP PLACEMENT;  Surgeon: Shila Gustav GAILS, MD;  Location: [facility];  Service: Endoscopy;;    IMPLANTATION VAGAL NERVE STIMULATOR     Left leaves on for surgeries, new battery 2022, first put in 2005.   POLYPECTOMY  08/22/2019   Procedure: POLYPECTOMY;  Surgeon: Shila Gustav GAILS, MD;  Location: [facility];  Service: Endoscopy;;   PORTACATH PLACEMENT  2013 and removed and new placed   right chest    FAMILY HISTORY: Family History  Problem Relation Age of Onset   Breast cancer Mother    Colon polyps Mother    Clotting disorder Mother    Diabetes Father    Hypertension Father    Colon polyps Father    Hyperlipidemia Father    Atrial fibrillation Father    Diabetes Maternal Grandmother    Heart disease Maternal Grandmother    Diabetes Maternal Grandfather    Heart disease Maternal Grandfather    Coronary artery disease Maternal Grandfather    Congestive Heart Failure Maternal Grandfather        Died at 49   Pneumonia Maternal Grandfather    Diabetes Paternal Grandmother    Heart disease Paternal Grandmother    Pneumonia Paternal Grandmother        Died at 45   Stroke Paternal Grandmother    Diabetes Paternal Grandfather    Heart disease Paternal Grandfather    Coronary artery disease Paternal Grandfather    Lung cancer Paternal Grandfather        Died at 65    SOCIAL HISTORY: Social History   Socioeconomic History   Marital status: Single    Spouse name: Not on file   Number of children: 0   Years of education: Not on file   Highest education level: Not on file  Occupational  History   Occupation: Disabled    Associate Professor: UNEMPLOYED  Tobacco Use   Smoking status: Never    Passive exposure: Never   Smokeless tobacco: Never  Vaping Use   Vaping status: Never Used  Substance and Sexual Activity   Alcohol use: No    Alcohol/week: 0.0 standard drinks of alcohol   Drug use: No   Sexual activity: Never  Other Topics Concern   Not on file  Social History Narrative   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   He is currently not attending a day program and is  not employed. Disabled.    He lives with his parents.    He enjoys helping around the house, Nascar, and football.   Caffiene soda 's with caffeien 2-3 / day   Social Drivers of Health   Financial Resource Strain: Low Risk  (05/02/2024)   Received from [facility]   Overall Financial Resource Strain (CARDIA)    How hard is it for you to pay for the very basics like food, housing, medical care, and heating?: Not hard at all  Food Insecurity: No Food Insecurity (05/02/2024)   Received from [facility]   Hunger Vital Sign    Within the past 12 months, you worried that your food would run out before you got the money to buy more.: Never true    Within the past 12 months, the food you bought just didn't last and you didn't have money to get more.: Never true  Transportation Needs: No Transportation Needs (05/02/2024)   Received from [facility] - Transportation    In the past 12 months, has lack of transportation kept you from medical appointments or from getting medications?: No    In the past 12 months, has lack of transportation kept you from meetings, work, or from getting things needed for daily living?: No  Physical Activity: Inactive (05/02/2024)   Received from [facility]   Exercise Vital Sign    On average, how many days per week do you engage in moderate to strenuous exercise (like a brisk walk)?: 0 days    Minutes of Exercise per Session: Not on file  Stress: No Stress Concern Present (05/02/2024)   Received from [facility] - Occupational Stress Questionnaire    Do you feel stress - tense, restless, nervous, or anxious, or unable to sleep at night because your mind is troubled all the time - these days?: Not at all  Social Connections: Socially Integrated (05/02/2024)   Received from [facility]   Social Network    How would you rate your social network (family, work, friends)?: Good participation with social networks   Intimate Partner Violence: Patient Declined (05/02/2024)   Received from [facility]   HITS    Over the last 12 months how often did your partner physically hurt you?: Patient declined    Over the last 12 months how often did your partner insult you or talk down to you?: Patient declined    Over the last 12 months how often did your partner threaten you with physical harm?: Patient declined    Over the last 12 months how often did your partner scream or curse at you?: Patient declined    PHYSICAL EXAM  GENERAL EXAM/CONSTITUTIONAL: Vitals:  Vitals:   06/09/24 1136  BP: 116/72  Pulse: 90  Weight: 129 lb (58.5 kg)  Height: 5' 3 (1.6 m)   Body mass index  is 22.85 kg/m. Wt Readings from Last 3 Encounters:  06/09/24 129 lb (58.5 kg)  03/01/24 122 lb 8 oz (55.6 kg)  02/15/24 135 lb (61.2 kg)   Patient is in no distress; well developed, nourished and groomed; neck is supple  MUSCULOSKELETAL: Gait, strength, tone, movements noted in Neurologic exam below  NEUROLOGIC: MENTAL STATUS:      No data to display         awake, alert, oriented to person Answering appropriately Able to follow commands and direction  Able to track examiner  Normal bulk and tone, he does have contraction  Sensory normal to light touch Clumsy gait and poor balance, he does need assistance with ambulation     DIAGNOSTIC DATA (LABS, IMAGING, TESTING) - I reviewed patient records, labs, notes, testing and imaging myself where available.  Lab Results  Component Value Date   WBC 5.3 02/28/2024   HGB 13.0 02/28/2024   HCT 40.1 02/28/2024   MCV 86.2 02/28/2024   PLT 241 02/28/2024      Component Value Date/Time   NA 139 02/28/2024 1550   NA 139 12/22/2023 0836   K 3.7 02/28/2024 1550   CL 105 02/28/2024 1550   CO2 20 (L) 02/28/2024 1550   GLUCOSE 89 02/28/2024 1550   BUN 14 02/28/2024 1550   BUN 13 12/22/2023 0836   CREATININE 0.91 02/28/2024 1550   CALCIUM  8.8 (L) 02/28/2024 1550    PROT 6.4 12/22/2023 0836   ALBUMIN 4.1 12/22/2023 0836   AST 14 12/22/2023 0836   ALT 12 12/22/2023 0836   ALKPHOS 80 12/22/2023 0836   BILITOT 0.3 12/22/2023 0836   GFRNONAA >60 02/28/2024 1550   GFRAA >60 12/06/2019 1158   Lab Results  Component Value Date   TRIG 187 (H) 02/13/2018   No results found for: HGBA1C No results found for: VITAMINB12 Lab Results  Component Value Date   TSH 0.040 (L) 12/22/2023    CT Head 11/19/2022 1. 4 mm thick hypodense subdural collection over the left cerebral convexity without mass effect or midline shift is likely subacute to chronic. No evidence of acute blood products. 2. Otherwise, no evidence of acute intracranial pathology. 3. Unchanged dense basal ganglia calcifications   EEG 11/20/22: - Intermittent slow, generalized and maximal left temporal    ASSESSMENT AND PLAN  38 y.o. year old male  with past medical history of intractable epilepsy status post VNS placement, intellectual disability, obstructive sleep apnea on CPAP, hypothyroidism, chronic migraines who is presenting for follow up for his intractable epilepsy, possibly Lennox-Gastaut syndrome.  His seizures improved to 2 seizures in the past 3 months when he used to have 2-3 seizures per month since the increase of Trokendi  200 mg twice daily.  We will further increase the Trokendi  to 200 mg in the morning and 300 mg at night and continue his other anti seizure medications including levetiracetam  1500 mg twice daily, Tegretol  150 mg 3 times daily, Vimpat  100/200 mg and clobazam  10/20 mg.  I will see him in 3 months for follow-up around December.  Return sooner if worse.  Also advised him to contact me if there are any side effect from the increase Trokendi .   1. 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 S/P placement of VNS (vagus nerve stimulation) device   3. Severe obstructive sleep apnea   4. Chronic migraine without aura without status  migrainosus, not intractable      Patient Instructions  Increase Trokendi  to 200  mg in the morning and 300 mg at night Continue levetiracetam  1500 mg twice daily Continue Tegretol  150 mg 3 times daily Continue Vimpat  100/200 mg Continue with clobazam  10/20 mg Follow-up in 64-month or sooner if worse. Please contact me if you do have worsening seizures or side effects from the medication.   Per [place]  DMV statutes, patients with seizures are not allowed to drive until they have been seizure-free for six months.  Other recommendations include using caution when using heavy equipment or power tools. Avoid working on ladders or at heights. Take showers instead of baths.  Do not swim alone.  Ensure the water  temperature is not too high on the home water  heater. Do not go swimming alone. Do not lock yourself in a room alone (i.e. bathroom). When caring for infants or small children, sit down when holding, feeding, or changing them to minimize risk of injury to the child in the event you have a seizure. Maintain good sleep hygiene. Avoid alcohol.  Also recommend adequate sleep, hydration, good diet and minimize stress.   During the Seizure  - First, ensure adequate ventilation and place patients on the floor on their left side  Loosen clothing around the neck and ensure the airway is patent. If the patient is clenching the teeth, do not force the mouth open with any object as this can cause severe damage - Remove all items from the surrounding that can be hazardous. The patient may be oblivious to what's happening and may not even know what he or she is doing. If the patient is confused and wandering, either gently guide him/her away and block access to outside areas - Reassure the individual and be comforting - Call 911. In most cases, the seizure ends before EMS arrives. However, there are cases when seizures may last over 3 to 5 minutes. Or the individual may have developed breathing  difficulties or severe injuries. If a pregnant patient or a person with diabetes develops a seizure, it is prudent to call an ambulance. - Finally, if the patient does not regain full consciousness, then call EMS. Most patients will remain confused for about 45 to 90 minutes after a seizure, so you must use judgment in calling for help. - Avoid restraints but make sure the patient is in a bed with padded side rails - Place the individual in a lateral position with the neck slightly flexed; this will help the saliva drain from the mouth and prevent the tongue from falling backward - Remove all nearby furniture and other hazards from the area - Provide verbal assurance as the individual is regaining consciousness - Provide the patient with privacy if possible - Call for help and start treatment as ordered by the caregiver   After the Seizure (Postictal Stage)  After a seizure, most patients experience confusion, fatigue, muscle pain and/or a headache. Thus, one should permit the individual to sleep. For the next few days, reassurance is essential. Being calm and helping reorient the person is also of importance.  Most seizures are painless and end spontaneously. Seizures are not harmful to others but can lead to complications such as stress on the lungs, brain and the heart. Individuals with prior lung problems may develop labored breathing and respiratory distress.     No orders of the defined types were placed in this encounter.   Meds ordered this encounter  Medications   Topiramate  ER (TROKENDI  XR) 100 MG CP24    Sig: Take 1 capsule (100 mg  total) by mouth at bedtime.    Dispense:  90 capsule    Refill:  3    To add with the 200 mg for a total of 300 mg nightly.    No follow-ups on file.  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  This provider is the continuing focal point for all  needed services for this condition.   Pastor Falling, MD 06/09/2024, 12:15 PM  [facility] [address], [address] [place], [place] [address] [phone]

## 2024-06-09 NOTE — Patient Instructions (Signed)
 Increase Trokendi  to 200 mg in the morning and 300 mg at night Continue levetiracetam  1500 mg twice daily Continue Tegretol  150 mg 3 times daily Continue Vimpat  100/200 mg Continue with clobazam  10/20 mg Follow-up in 34-month or sooner if worse. Please contact me if you do have worsening seizures or side effects from the medication.

## 2024-06-10 ENCOUNTER — Emergency Department (HOSPITAL_BASED_OUTPATIENT_CLINIC_OR_DEPARTMENT_OTHER)
Admission: EM | Admit: 2024-06-10 | Discharge: 2024-06-10 | Discharge status: Against Medical Advice | Source: Ambulatory Visit | Attending: Emergency Medicine | Admitting: Emergency Medicine

## 2024-06-10 ENCOUNTER — Encounter (HOSPITAL_BASED_OUTPATIENT_CLINIC_OR_DEPARTMENT_OTHER): Payer: Self-pay

## 2024-06-10 ENCOUNTER — Other Ambulatory Visit: Payer: Self-pay

## 2024-06-10 ENCOUNTER — Emergency Department (HOSPITAL_BASED_OUTPATIENT_CLINIC_OR_DEPARTMENT_OTHER): Admitting: Radiology

## 2024-06-10 DIAGNOSIS — R059 Cough, unspecified: Secondary | ICD-10-CM | POA: Diagnosis present

## 2024-06-10 DIAGNOSIS — J069 Acute upper respiratory infection, unspecified: Secondary | ICD-10-CM | POA: Diagnosis not present

## 2024-06-10 DIAGNOSIS — Z5321 Procedure and treatment not carried out due to patient leaving prior to being seen by health care provider: Secondary | ICD-10-CM | POA: Insufficient documentation

## 2024-06-10 LAB — RESP PANEL BY RT-PCR (RSV, FLU A&B, COVID)  RVPGX2
Influenza A by PCR: NEGATIVE
Influenza B by PCR: NEGATIVE
Resp Syncytial Virus by PCR: NEGATIVE
SARS Coronavirus 2 by RT PCR: NEGATIVE

## 2024-06-10 NOTE — ED Triage Notes (Signed)
 Pt arrives w family, we thought his O2 level was really low at home (advises 75 at home), he's been coughing/ wheezing today. Virtual appt w PCP, advised him to come to ED for eval. No fevers  Family advises predisposition to bronchitis, pneumonia, trying to prevent that from happening.

## 2024-06-13 ENCOUNTER — Telehealth: Payer: Self-pay

## 2024-06-13 DIAGNOSIS — E876 Hypokalemia: Secondary | ICD-10-CM

## 2024-06-13 NOTE — Telephone Encounter (Signed)
 Faxed orders to [facility]

## 2024-06-26 ENCOUNTER — Other Ambulatory Visit: Payer: Self-pay | Admitting: Neurology

## 2024-06-26 DIAGNOSIS — G40119 Localization-related (focal) (partial) symptomatic epilepsy and epileptic syndromes with simple partial seizures, intractable, without status epilepticus: Secondary | ICD-10-CM

## 2024-06-26 DIAGNOSIS — G40319 Generalized idiopathic epilepsy and epileptic syndromes, intractable, without status epilepticus: Secondary | ICD-10-CM

## 2024-06-27 ENCOUNTER — Ambulatory Visit: Attending: Cardiovascular Disease | Admitting: Cardiovascular Disease

## 2024-06-27 ENCOUNTER — Encounter: Payer: Self-pay | Admitting: Cardiovascular Disease

## 2024-06-27 VITALS — BP 84/62 | HR 76 | Ht 63.0 in | Wt 124.0 lb

## 2024-06-27 DIAGNOSIS — I340 Nonrheumatic mitral (valve) insufficiency: Secondary | ICD-10-CM | POA: Diagnosis not present

## 2024-06-27 NOTE — Patient Instructions (Signed)
 Medication Instructions:  Your physician recommends that you continue on your current medications as directed. Please refer to the Current Medication list given to you today.  *If you need a refill on your cardiac medications before your next appointment, please call your pharmacy*  Lab Work: NONE  If you have labs (blood work) drawn today and your tests are completely normal, you will receive your results only by: MyChart Message (if you have MyChart) OR A paper copy in the mail If you have any lab test that is abnormal or we need to change your treatment, we will call you to review the results.  Testing/Procedures: Your physician has requested that you have an echocardiogram. Echocardiography is a painless test that uses sound waves to create images of your heart. It provides your doctor with information about the size and shape of your heart and how well your heart's chambers and valves are working. This procedure takes approximately one hour. There are no restrictions for this procedure. Please do NOT wear cologne, perfume, aftershave, or lotions (deodorant is allowed). Please arrive 15 minutes prior to your appointment time.  Please note: We ask at that you not bring children with you during ultrasound (echo/ vascular) testing. Due to room size and safety concerns, children are not allowed in the ultrasound rooms during exams. Our front office staff cannot provide observation of children in our lobby area while testing is being conducted. An adult accompanying a patient to their appointment will only be allowed in the ultrasound room at the discretion of the ultrasound technician under special circumstances. We apologize for any inconvenience.   Follow-Up: At [facility], you and your health needs are our priority.  As part of our continuing mission to provide you with exceptional heart care, our providers are all part of one team.  This team includes your primary Cardiologist  (physician) and Advanced Practice Providers or APPs (Physician Assistants and Nurse Practitioners) who all work together to provide you with the care you need, when you need it.  Your next appointment:   1 year(s)  Provider:   Lonni Cash, MD

## 2024-06-27 NOTE — Progress Notes (Signed)
 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 He was initially seen by Dr. Fernande in 2013 after he developed atrial fib following wire manipulation for [name] replacement. No recurrence since then. He has frequent seizures for many years. Cardiac monitor in 2015 showed sinus rhythm with no arrhythmias. Last echo May 2021 with LVEF=55-60%, mild to moderate mitral regurgitation.  He is here today for follow up. The patient denies any chest pain, dyspnea, palpitations, lower extremity edema, orthopnea, PND, dizziness, near syncope or syncope.    Primary Care Physician: Dorcus Lamar POUR, MD  Past Medical History:  Diagnosis Date   Afib [facility])    only per portocath removal.   ALLERGIC RHINITIS 12/26/2009   Atrial fibrillation (HCC) 04/08/2012   Formatting of this note might be different from the original.  Last Assessment & Plan:   One episode during a procedure in July. Likely induced by the wire. No recurrence. He is in normal sinus rhythm today. No further workup or treatment.   Overview:   Paroxysmal - occurred during a procedure - has been anticoagulated with Coumadin  but initiated due to DVT  Formatting of this note might be differ   Blood clot in vein 2015   Brown's syndrome    Complication of anesthesia    seizure after pac surgery at [facility] 2013 had to be intubated, no other anes issurs   Growth disorder    Headache    migraines   Heart murmur    moderate MR ses dr nicanor   Hyperlipidemia    No therapy   Hypokalemia    Mental retardation, mild (I.Q. 50-70)    MITRAL VALVE PROLAPSE 06/22/2007   SEIZURE DISORDER 06/22/2007   last seizure 08-02-2019   Sleep  apnea    cpap , last sleep study 10/01/11   Unspecified hypothyroidism 06/22/2007    Past Surgical History:  Procedure Laterality Date   ANKLE SURGERY Left    fused does not bend, wears right leg brace 2018 at [facility]   BIOPSY  08/22/2019   Procedure: BIOPSY;  Surgeon: Shila Gustav GAILS, MD;  Location: [facility];  Service: Endoscopy;;   BIOPSY  10/02/2022   Procedure: BIOPSY;  Surgeon: Shila Gustav GAILS, MD;  Location: [facility];  Service: Gastroenterology;;   CATARACT EXTRACTION     2021   COLONOSCOPY WITH PROPOFOL  N/A 08/22/2019   Procedure: COLONOSCOPY WITH PROPOFOL ;  Surgeon: Shila Gustav GAILS, MD;  Location: [facility];  Service: Endoscopy;  Laterality: N/A;   COLONOSCOPY WITH PROPOFOL  N/A 10/02/2022   Procedure: COLONOSCOPY WITH PROPOFOL ;  Surgeon: Shila Gustav GAILS, MD;  Location: [facility];  Service: Gastroenterology;  Laterality: N/A;   EYE SURGERY     X2 for Brown Syndrome   HEMOSTASIS CLIP PLACEMENT  08/22/2019   Procedure: HEMOSTASIS CLIP PLACEMENT;  Surgeon: Shila Gustav GAILS, MD;  Location: [facility];  Service: Endoscopy;;   IMPLANTATION VAGAL NERVE STIMULATOR     Left leaves on for surgeries, new battery 2022, first put in 2005.   POLYPECTOMY  08/22/2019   Procedure: POLYPECTOMY;  Surgeon: Shila Gustav GAILS, MD;  Location: [facility];  Service: Endoscopy;;   PORTACATH PLACEMENT  2013 and removed and new placed   right chest  Current Outpatient Medications  Medication Sig Dispense Refill   albuterol  (ACCUNEB ) 1.25 MG/3ML nebulizer solution Take 1 ampule by nebulization every 4 (four) hours as needed for wheezing.     amoxicillin  (AMOXIL ) 500 MG capsule Take 2,000 mg by mouth.     Atogepant  (QULIPTA ) 60 MG TABS Take 1 tablet (60 mg total) by mouth daily at 12 noon. 30 tablet 11   azelastine (ASTELIN) 0.1 % nasal spray 2 sprays.     Calcium -Vitamin D -Vitamin K (VIACTIV CALCIUM  PLUS D) 650-12.5-40 MG-MCG-MCG CHEW Chew 1 each by mouth daily.      carbamazepine  (TEGRETOL ) 100 MG chewable tablet CHEW 1.5 TABLETS (150 MG TOTAL) BY MOUTH 3 (THREE) TIMES DAILY. 405 tablet 1   cetirizine (ZYRTEC) 10 MG tablet Take 10 mg by mouth daily.     diazePAM , 20 MG Dose, (VALTOCO  20 MG DOSE) 2 x 10 MG/0.1ML LQPK Place 20 mg into the nose as needed (for seizure lasting more than 2 minutes). 5 each 5   Flaxseed, Linseed, (FLAXSEED OIL) 1000 MG CAPS Take 1,000 mg by mouth daily.     fluticasone  (FLONASE ) 50 MCG/ACT nasal spray Place 2 sprays into the nose.     K-PHOS  500 MG tablet Take 1 tablet (500 mg total) by mouth 4 (four) times daily. 124 tablet 5   KLOR-CON  M20 20 MEQ tablet TAKE 1 TABLET BY MOUTH EVERY DAY 90 tablet 2   levETIRAcetam  (KEPPRA ) 500 MG tablet TAKE 3 TABLETS (1,500 MG TOTAL) BY MOUTH IN THE MORNING AND AT BEDTIME. 540 tablet 3   levothyroxine  (SYNTHROID ) 75 MCG tablet Take 75 mcg by mouth daily before breakfast.     montelukast  (SINGULAIR ) 10 MG tablet Take 10 mg by mouth at bedtime.  6   Multiple Vitamin (MULTIVITAMIN WITH MINERALS) TABS Take 1 tablet by mouth daily.     Omega-3 Fatty Acids (FISH OIL) 500 MG CAPS Take 500 mg by mouth daily.     ondansetron  (ZOFRAN -ODT) 4 MG disintegrating tablet Take 1 tablet (4 mg total) by mouth every 8 (eight) hours as needed for nausea or vomiting. 30 tablet 0   [name]  10 MG tablet TAKE 1 TABLET BY MOUTH IN THE MORNING, AND 2 AT BEDTIME 90 tablet 3   PHENObarbital  (LUMINAL) 65 MG/ML injection Withdraw 1ml (65mg ) Phenobarbital  and 4ml Normal Saline into 5ml syringe. Adminster IV push for 3-5 minutes as needed for recurrent seizures 5 mL 5   promethazine  (PHENERGAN ) 25 MG suppository Place 25 mg rectally every 6 (six) hours as needed for nausea or vomiting.     rosuvastatin  (CRESTOR ) 10 MG tablet Take 10 mg by mouth at bedtime.  5   terbinafine (LAMISIL) 250 MG tablet Take 250 mg by mouth daily.     Topiramate  ER (TROKENDI  XR) 100 MG CP24 Take 1 capsule (100 mg total) by mouth at bedtime. 90 capsule 3    Topiramate  ER (TROKENDI  XR) 200 MG CP24 Take 1 capsule (200 mg total) by mouth in the morning and at bedtime. 90 capsule 3   VIMPAT  100 MG TABS TAKE 1 TABLET IN THE MORNING AND TAKE 2 TABLETS AT NIGHT 270 tablet 5   Vitamin D , Ergocalciferol , (DRISDOL ) 1.25 MG (50000 UNIT) CAPS capsule TAKE 1 CAPSULE (50,000 UNITS TOTAL) BY MOUTH EVERY 7 (SEVEN) DAYS 12 capsule 0   Zavegepant HCl (ZAVZPRET ) 10 MG/ACT SOLN Place 10 mg into the nose as needed (Migraines). 6 each 6   No current facility-administered medications for this visit.    Allergies  Allergen Reactions   Antihistamines, Chlorpheniramine-Type Other (See Comments)    Other Reaction: Dimetapp causes seizures  Allergen- Dimetapp, Reaction - seizures   Divalproex Sodium Other (See Comments)    Nausea, vomiting, liver and kidney dysfunction   Phenytoin Rash and Other (See Comments)    Dilantin causes more seizures.    Valproic Acid And Related Other (See Comments)    Shuts down systems   Vancomycin  Other (See Comments), Rash and Swelling    If given too fast, causes red man reaction  Other Reaction: Redman's Syndrome  Other Reaction(s): Angioedema   Antihistamines, Diphenhydramine-Type Other (See Comments)    seizures   Phenytoin Sodium Extended Swelling    Enhances seizures  Other Reaction(s): Angioedema   Valproic Acid Other (See Comments)    Shuts down systems  Other Reaction(s): Angioedema  Starts to shut down body functions   Divalproex Sodium Rash    Nausea, vomiting, liver and kidney dysfunction    Social History   Socioeconomic History   Marital status: Single    Spouse name: Not on file   Number of children: 0   Years of education: Not on file   Highest education level: Not on file  Occupational History   Occupation: Disabled    Employer: UNEMPLOYED  Tobacco Use   Smoking status: Never    Passive exposure: Never   Smokeless tobacco: Never  Vaping Use   Vaping status: Never Used  Substance and Sexual  Activity   Alcohol use: No    Alcohol/week: 0.0 standard drinks of alcohol   Drug use: No   Sexual activity: Never  Other Topics Concern   Not on file  Social History Narrative   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 Disabled.    He lives with his parents.    He enjoys helping around the house, Nascar, and football.   Caffiene soda 's with caffeien 2-3 / day   Social Drivers of Health   Financial Resource Strain: Low Risk  (05/02/2024)   Received from [facility]   Overall Financial Resource Strain (CARDIA)    How hard is it for you to pay for the very basics like food, housing, medical care, and heating?: Not hard at all  Food Insecurity: No Food Insecurity (05/02/2024)   Received [facility]   Hunger Vital Sign    Within the past 12 months, you worried that your food would run out before you got the money to buy more.: Never true    Within the past 12 months, the food you bought just didn't last and you didn't have money to get more.: Never true  Transportation Needs: No Transportation Needs (05/02/2024)   Received from [facility] - Transportation    In the past 12 months, has lack of transportation kept you from medical appointments or from getting medications?: No    In the past 12 months, has lack of transportation kept you from meetings, work, or from getting things needed for daily living?: No  Physical Activity: Inactive (05/02/2024)   Received from [facility]   Exercise Vital Sign    On average, how many days per week do you engage in moderate to strenuous exercise (like a brisk walk)?: 0 days    Minutes of Exercise per Session: Not on file  Stress: No Stress Concern Present (05/02/2024)   Received from [facility] - Occupational Stress Questionnaire    Do you feel stress - tense, restless, nervous, or anxious, or unable to sleep at night  because your mind is troubled all the time - these days?: Not at all  Social Connections: Socially Integrated (05/02/2024)   Received from [facility]   Social Network    How would you rate your social network (family, work, friends)?: Good participation with social networks  Intimate Partner Violence: Patient Declined (05/02/2024)   Received from [facility]   HITS    Over the last 12 months how often did your partner physically hurt you?: Patient declined    Over the last 12 months how often did your partner insult you or talk down to you?: Patient declined    Over the last 12 months how often did your partner threaten you with physical harm?: Patient declined    Over the last 12 months how often did your partner scream or curse at you?: Patient declined    Family History  Problem Relation Age of Onset   Breast cancer Mother    Colon polyps Mother    Clotting disorder Mother    Diabetes Father    Hypertension Father    Colon polyps Father    Hyperlipidemia Father    Atrial fibrillation Father    Diabetes Maternal Grandmother    Heart disease Maternal Grandmother    Diabetes Maternal Grandfather    Heart disease Maternal Grandfather    Coronary artery disease Maternal Grandfather    Congestive Heart Failure Maternal Grandfather        Died at 75   Pneumonia Maternal Grandfather    Diabetes Paternal Grandmother    Heart disease Paternal Grandmother    Pneumonia Paternal Grandmother        Died at 24   Stroke Paternal Grandmother    Diabetes Paternal Grandfather    Heart disease Paternal Grandfather    Coronary artery disease Paternal Grandfather    Lung cancer Paternal Grandfather        Died at 69    Review of Systems:  As stated in the HPI and otherwise negative.   BP (!) 84/62   Pulse 76   Ht 5' 3 (1.6 m)   Wt 124 lb (56.2 kg)   SpO2 92%   BMI 21.97 kg/m   Physical Examination: General: Well developed, well nourished, NAD  HEENT: OP clear, mucus membranes  moist  SKIN: warm, dry. No rashes. Neuro: No focal deficits  Musculoskeletal: Muscle strength 5/5 all ext  Psychiatric: Mood and affect normal  Neck: No JVD, no carotid bruits, no thyromegaly, no lymphadenopathy.  Lungs:Clear bilaterally, no wheezes, rhonci, crackles Cardiovascular: Regular rate and rhythm. Soft systolic murmur.  Abdomen:Soft. Bowel sounds present. Non-tender.  Extremities: No lower extremity edema. Pulses are 2 + in the bilateral DP/PT.  Echo May 2021:  1. Normal LV function; MR not well assessed but appears mild to moderate.   2. Left ventricular ejection fraction, by estimation, is 55 to 60%. The  left ventricle has normal function. The left ventricle has no regional  wall motion abnormalities. Left ventricular diastolic parameters were  normal.   3. Right ventricular systolic function is normal. The right ventricular  size is normal. There is normal pulmonary artery systolic pressure.   4. The mitral valve is normal in structure. Mild to moderate mitral valve  regurgitation. No evidence of mitral stenosis.   5. The aortic valve is tricuspid. Aortic valve regurgitation  is not  visualized. Mild aortic valve sclerosis is present, with no evidence of  aortic valve stenosis.   6. The inferior vena cava is normal in size with greater than 50%  respiratory variability, suggesting right atrial pressure of 3 mmHg.    EKG:  EKG is  ordered today. The ekg ordered today demonstrates  EKG Interpretation Date/Time:  Monday June 27 2024 08:37:46 EDT Ventricular Rate:  76 PR Interval:  188 QRS Duration:  96 QT Interval:  384 QTC Calculation: 432 R Axis:   60  Text Interpretation: Normal sinus rhythm Poor R wave progression Confirmed by Verlin Bruckner [phone]) on 06/27/2024 8:42:01 AM    Recent Labs: 12/22/2023: ALT 12; TSH 0.040 02/28/2024: BUN 14; Creatinine, Ser 0.91; Hemoglobin 13.0; Platelets 241; Potassium 3.7; Sodium 139    Wt Readings from Last 3  Encounters:  06/27/24 124 lb (56.2 kg)  06/09/24 129 lb (58.5 kg)  03/01/24 122 lb 8 oz (55.6 kg)    Assessment and Plan:   1. Mitral regurgitation: Mild to moderate MR by echo in 2021.  Repeat echo now.   Labs/ tests ordered today include:   Orders Placed This Encounter  Procedures   EKG 12-Lead   ECHOCARDIOGRAM COMPLETE   Disposition:   F/U with me in 12  months  Signed, Bruckner Verlin, MD 06/27/2024 9:08 AM    [facility] [address] [place], [place], [place]  [address] Phone: [phone]; Fax: [phone]

## 2024-07-01 ENCOUNTER — Other Ambulatory Visit: Payer: Self-pay | Admitting: Neurology

## 2024-07-01 DIAGNOSIS — G40119 Localization-related (focal) (partial) symptomatic epilepsy and epileptic syndromes with simple partial seizures, intractable, without status epilepticus: Secondary | ICD-10-CM

## 2024-07-01 DIAGNOSIS — G40319 Generalized idiopathic epilepsy and epileptic syndromes, intractable, without status epilepticus: Secondary | ICD-10-CM

## 2024-07-01 MED ORDER — VIMPAT 100 MG PO TABS: ORAL_TABLET | ORAL | 5 refills | Status: AC

## 2024-07-01 NOTE — Addendum Note (Signed)
 Addended byBETHA GREGG LEK on: 07/01/2024 11:18 AM   Modules accepted: Orders

## 2024-07-11 NOTE — Telephone Encounter (Signed)
 Call to [facility], spoke to [place], she is looking into this and reach back out.

## 2024-07-11 NOTE — Telephone Encounter (Signed)
 Pt mother called to follow up  on [facility] . Pt mother stated that [facility] called to collect Pt  supplies ,However  Pt mother stated that  MD was suppose to fax over order . Informed mother that order was faxed . Mother is stating that  Kimber informed they have not received anything from office   [facility] number is  [phone]

## 2024-07-12 NOTE — Telephone Encounter (Signed)
 This was an order for oxygen.

## 2024-07-13 NOTE — Telephone Encounter (Signed)
 Call to [name] she states they need office visit notes documenting his O2 saturations. She states that yearly office notes are needed because insurance has changed. Will send message to family.

## 2024-07-18 ENCOUNTER — Telehealth: Payer: Self-pay | Admitting: Neurology

## 2024-07-18 ENCOUNTER — Telehealth: Payer: Self-pay

## 2024-07-18 NOTE — Telephone Encounter (Signed)
 Pt dropped off form for handicap placard, please call mom Doris when ready for PU

## 2024-07-19 NOTE — Telephone Encounter (Signed)
 Gave pt's paperwork to Medical Records on 07/18/2024.

## 2024-07-25 NOTE — Telephone Encounter (Signed)
 Note updated

## 2024-07-26 NOTE — Telephone Encounter (Signed)
 I spoke with Tonya at [facility], because patient lives at home and doesn't have official documentation from medical personnel insurance will not cover a concentrator. [facility] explained that the patient does have  portable tank and emergency tanks/cannula should he need oxygen during a seizure. When the tanks are empty family was informed that they are responsible for refilling tanks. [facility] very kind

## 2024-08-05 ENCOUNTER — Ambulatory Visit (HOSPITAL_COMMUNITY): Admission: RE | Admit: 2024-08-05 | Source: Ambulatory Visit

## 2024-08-08 ENCOUNTER — Encounter (HOSPITAL_COMMUNITY): Payer: Self-pay | Admitting: Cardiovascular Disease

## 2024-08-08 MED ORDER — POTASSIUM CHLORIDE CRYS ER 20 MEQ PO TBCR
20.0000 meq | EXTENDED_RELEASE_TABLET | Freq: Every day | ORAL | 2 refills | Status: AC
Start: 2024-08-08 — End: ?

## 2024-08-08 NOTE — Addendum Note (Signed)
 Addended by: JOSHUA IZETTA CROME on: 08/08/2024 08:15 AM   Modules accepted: Orders

## 2024-08-08 NOTE — Addendum Note (Signed)
 Addended byBETHA GREGG LEK on: 08/08/2024 10:10 AM   Modules accepted: Orders

## 2024-08-16 ENCOUNTER — Other Ambulatory Visit: Payer: Self-pay | Admitting: Neurology

## 2024-08-16 DIAGNOSIS — G40319 Generalized idiopathic epilepsy and epileptic syndromes, intractable, without status epilepticus: Secondary | ICD-10-CM

## 2024-08-16 NOTE — Telephone Encounter (Signed)
 Requested Prescriptions   Pending Prescriptions Disp Refills   [name]  10 MG tablet [Pharmacy Med Name: [name]  10 MG TABLET] 90 tablet     Sig: TAKE 1 TABLET BY MOUTH IN THE MORNING, AND 2 AT BEDTIME   Last seen 06/09/24 Next appt 09/09/24  Dispenses   Dispensed Days Supply Quantity Provider Pharmacy  [name]  10 MG TABLET 07/18/2024 30 90 each Camara, Amadou, MD [facility] [phone] - G...  [name]  10 MG TABLET 06/18/2024 30 90 each Camara, Amadou, MD [facility] [phone] - G...  [name]  10 MG TABLET 05/17/2024 30 90 each Camara, Amadou, MD [facility] [phone] - G...  [name]  10 MG TABLET 04/12/2024 30 90 each Camara, Amadou, MD [facility] [phone] - G...  [name]  10 MG TABLET 03/15/2024 30 90 each Camara, Amadou, MD [facility] [phone] - G...  [name]  10 MG TABLET 02/17/2024 30 90 each Camara, Amadou, MD [facility] [phone] - G...  [name]  10 MG TABLET 01/15/2024 30 90 each Camara, Amadou, MD [facility] [phone] - G...  [name]  10 MG TABLET 12/17/2023 30 90 each Camara, Amadou, MD [facility] [phone] - G...  [name]  10 MG TABLET 11/17/2023 30 90 each Camara, Amadou, MD [facility] [phone] - G...  [name]  10 MG TABLET 10/19/2023 30 90 each Camara, Amadou, MD [facility] [phone] - G...  [name]  10 MG TABLET 09/19/2023 30 90 each Camara, Amadou, MD [facility] [phone] - G...  [name]  10 MG TABLET 08/22/2023 30 90 each Camara, Amadou, MD [facility] [phone] - G.SABRASABRA

## 2024-09-09 ENCOUNTER — Encounter: Payer: Self-pay | Admitting: Neurology

## 2024-09-09 ENCOUNTER — Ambulatory Visit: Admitting: Neurology

## 2024-09-09 VITALS — BP 88/60 | HR 76 | Ht 63.0 in | Wt 132.0 lb

## 2024-09-09 DIAGNOSIS — G40813 Lennox-Gastaut syndrome, intractable, with status epilepticus: Secondary | ICD-10-CM | POA: Diagnosis not present

## 2024-09-09 DIAGNOSIS — Z9689 Presence of other specified functional implants: Secondary | ICD-10-CM | POA: Diagnosis not present

## 2024-09-09 DIAGNOSIS — G4733 Obstructive sleep apnea (adult) (pediatric): Secondary | ICD-10-CM | POA: Diagnosis not present

## 2024-09-09 DIAGNOSIS — G43709 Chronic migraine without aura, not intractable, without status migrainosus: Secondary | ICD-10-CM

## 2024-09-09 NOTE — Progress Notes (Signed)
 "  [facility]  PATIENT: [name] DOB: [birth year]  REQUESTING CLINICIAN: Beam, Lamar POUR, MD HISTORY FROM: Mother, sister and chart review  REASON FOR VISIT: Transitioning from peds to adult. Epilepsy management.    HISTORICAL  CHIEF COMPLAINT:  Chief Complaint  Patient presents with   RM 12    Patient is here with mother and father for 3 month follow-up. Would like parking placard application completed.     INTERVAL HISTORY 09/09/2024 [name] presents today for follow-up, he is accompanied by parents.  Last visit was in September since that he continued to do well.  He did have 1 seizure in October, 0 seizure in November and 2 brief seizure in December on average once a month.  He is compliant with his medications including carbamazepine  150 mg p.o. 3 times daily, clobazam  10/20 mg, Vimpat  100/200 mg, Keppra  1500 mg twice daily, Trokendi  200/300 mg.  He does have side effect of dizziness and unsteadiness.  His headaches are also better.  He does use Zavzpret  as needed    INTERVAL HISTORY 06/09/2024 [name] presented for follow-up, he is accompanied by parents.  Last visit was in June at that time we continued his medications but increase his Trokendi  to 200 mg twice daily.  Since then, he did have an improvement in his seizures.  Had 1 seizure in July and 1 seizure in September where he used to have 2-3 seizures per month.  They also report that his headaches have improved, Zavzpret  has been helpful in controlling the headaches.  He has not used the Valtoco . Per family, in the past with his seizure, his oxygen Saturation will drop to about 75-80% while the oxygen is set on 10.    INTERVAL HISTORY 03/01/2024 [name] present today for follow-up, he is accompanied by both parents.  Last visit was in March, at that time we increase his Tegretol  to 200 mg TID and continued his medications but he had dizziness, so family decided to reduce the Tegretol  to 150 mg  TID.  He continued to have seizures, on average 2 to 3/month.  Last week he did have a cluster of seizures due to migraines, vomiting, not able to hold his antiseizure medications.  He ended going to the hospital and receiving migraine cocktail and IV fluid for dehydration. When he comes to the seizure, mother has told me the patient has tried ketogenic diet in the past which was not successful, was on Epidiolex  caused him dizziness so it was not maximized.    INTERVAL HISTORY 12/08/2023:  [name] presents today for follow-up, he is accompanied by mother and sister.  Last visit was in July, at that time we continued current medications.  They tells me that he continues to have seizures, 1 every 2 to 3 weeks.  Family reports the seizures are mainly related to sleep, whenever falling asleep or waking up from sleep.  He is compliant with his medications, denies any side effect. They are asking for a prescription for his O2 supplement sent to [facility] (Prescription previously sent by Dr. Susen)    INTERVAL HISTORY 04/08/2023:  [name] presents for follow-up, he is accompanied by mother and father.  Last visit was in May at that time we started him on Epidiolex  but it caused side effects of dizziness, gait instability, and somnolence.  He could not walk.  Due to these side effects, Epidiolex  was discontinued.  While on Epidiolex  he continued to have seizures 2 to 3/month.  His seizure frequency has not changed again 2 to 3/month.  He is compliant with his medications, does not swipe his VNS multiple times per day.  Family do not have any major concerns currently.   HISTORY OF PRESENT ILLNESS:  This is a 38 year old gentleman with multiple medical conditions including intellectual disability, intractable epilepsy status post VNS placement, hypothyroidism, hormone deficiency, obstructive sleep apnea on CPAP who is presenting to establish care.  Patient was initially previously managed by  pediatric neurology and Dr. Donice at [facility] but now he needs a local adult neurologist.   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  6 months later he started having his first seizure.  Since then his seizures have been intractable.  He had multiple type of  seizures including tonic seizures, bilateral tonic-clonic seizures, focal sensory seizures where he is aware, atonic seizure where he will fall and staring seizures.  He has tried multiple medication listed below.   Currently he is on the combination on 5 antiseizure medications including Tegretol  150 mg 3 times daily, clobazam  10/10 and 20 mg at bedtime, Vimpat  100 mg in the morning 200 at night, Keppra  1000 mg in the morning and 1250 in the evening and Trokendi  250 at bedtime.  Family reports that his seizures remain intractable despite VNS therapy.  He continued to have 2-3 seizures a month.  He was admitted last in the hospital in February for seizures and his last seizure was on May 13.  Family has reported prolonged seizure where he will need CPR, he also had status where he was intubated but currently they feel like the seizures are stable not getting worse even though he is getting 2 to 3/month.  Denies any history of birth trauma and he did not stay in the ICU as a baby. They could not related any seizure risk factors.    Handedness: Right   Onset: At the age of 38  Seizure Type: Tonic seizures, bilateral tonic clonic seizures, atonic seizures (drop seizure), focal sensory seizures and staring seizures  Current frequency: Last seizures 5/13, 2 to 3 seizures a months   Any injuries from seizures: Fall, concussion   Seizure risk factors: None reported   Previous ASMs: Gabapentin, Tegretol , Lamotrigine, Felbatol, Depakote, Topamax , Keppra , [name] , Phenobarbital , Lacosamide , Phenytoin  Currenty ASMs: Tegretol  150 mg TID, Clobazam  10/20 mg, Vimpat  100/200 mg, Keppra   1500 mg BID, Trokendi  200/300 mg   ASMs side effects: Allergic reaction to Phenytoin and Dilantin   Brain Images: No intracranial pathology. Last CT with subdural collection   Previous EEGs: Diffuse slow, focal left slowing   OTHER MEDICAL CONDITIONS: Intellectual disability, Intractable seizures s/p VNS placement, OSA on CPAP, Hypothyroidism   REVIEW OF SYSTEMS: Full 14 system review of systems performed and negative with exception of: As noted in the HPI   ALLERGIES: Allergies  Allergen Reactions   Antihistamines, Chlorpheniramine-Type Other (See Comments)    Other Reaction: Dimetapp causes seizures  Allergen- Dimetapp, Reaction - seizures   Divalproex Sodium Other (See Comments)    Nausea, vomiting, liver and kidney dysfunction   Phenytoin Rash and Other (See Comments)    Dilantin causes more seizures.    Valproic Acid And Related Other (See Comments)    Shuts down systems   Vancomycin  Other (See Comments), Rash and Swelling    If given too fast, causes red man reaction  Other Reaction: Redman's Syndrome  Other Reaction(s):  Angioedema   Antihistamines, Diphenhydramine-Type Other (See Comments)    seizures   Phenytoin Sodium Extended Swelling    Enhances seizures  Other Reaction(s): Angioedema   Valproic Acid Other (See Comments)    Shuts down systems  Other Reaction(s): Angioedema  Starts to shut down body functions   Divalproex Sodium Rash    Nausea, vomiting, liver and kidney dysfunction    HOME MEDICATIONS: Outpatient Medications Prior to Visit  Medication Sig Dispense Refill   albuterol  (ACCUNEB ) 1.25 MG/3ML nebulizer solution Take 1 ampule by nebulization every 4 (four) hours as needed for wheezing.     amoxicillin  (AMOXIL ) 500 MG capsule Take 2,000 mg by mouth. Before dental procedures     azelastine (ASTELIN) 0.1 % nasal spray 2 sprays.     Calcium -Vitamin D -Vitamin K (VIACTIV CALCIUM  PLUS D) 650-12.5-40 MG-MCG-MCG CHEW Chew 1 each by mouth daily.      carbamazepine  (TEGRETOL ) 100 MG chewable tablet CHEW 1.5 TABLETS (150 MG TOTAL) BY MOUTH 3 (THREE) TIMES DAILY. 405 tablet 1   cetirizine (ZYRTEC) 10 MG tablet Take 10 mg by mouth daily.     diazePAM , 20 MG Dose, (VALTOCO  20 MG DOSE) 2 x 10 MG/0.1ML LQPK Place 20 mg into the nose as needed (for seizure lasting more than 2 minutes). 5 each 5   Flaxseed, Linseed, (FLAXSEED OIL) 1000 MG CAPS Take 1,000 mg by mouth daily.     K-PHOS  500 MG tablet Take 1 tablet (500 mg total) by mouth 4 (four) times daily. 124 tablet 5   levETIRAcetam  (KEPPRA ) 500 MG tablet TAKE 3 TABLETS (1,500 MG TOTAL) BY MOUTH IN THE MORNING AND AT BEDTIME. 540 tablet 3   levothyroxine  (SYNTHROID ) 75 MCG tablet Take 75 mcg by mouth daily before breakfast.     montelukast  (SINGULAIR ) 10 MG tablet Take 10 mg by mouth at bedtime.  6   Multiple Vitamin (MULTIVITAMIN WITH MINERALS) TABS Take 1 tablet by mouth daily.     Omega-3 Fatty Acids (FISH OIL) 500 MG CAPS Take 500 mg by mouth daily.     ondansetron  (ZOFRAN -ODT) 4 MG disintegrating tablet Take 1 tablet (4 mg total) by mouth every 8 (eight) hours as needed for nausea or vomiting. 30 tablet 0   [name]  10 MG tablet TAKE 1 TABLET BY MOUTH IN THE MORNING, AND 2 AT BEDTIME 90 tablet 5   PHENObarbital  (LUMINAL) 65 MG/ML injection Withdraw 1ml (65mg ) Phenobarbital  and 4ml Normal Saline into 5ml syringe. Adminster IV push for 3-5 minutes as needed for recurrent seizures 5 mL 5   potassium chloride  SA (KLOR-CON  M20) 20 MEQ tablet Take 1 tablet (20 mEq total) by mouth daily. 90 tablet 2   promethazine  (PHENERGAN ) 25 MG suppository Place 25 mg rectally every 6 (six) hours as needed for nausea or vomiting.     rosuvastatin  (CRESTOR ) 10 MG tablet Take 10 mg by mouth at bedtime.  5   terbinafine (LAMISIL) 250 MG tablet Take 250 mg by mouth daily.     Topiramate  ER (TROKENDI  XR) 100 MG CP24 Take 1 capsule (100 mg total) by mouth at bedtime. 90 capsule 3   Topiramate  ER (TROKENDI  XR) 200 MG CP24 TAKE  1 CAPSULE (200 MG TOTAL) BY MOUTH IN THE MORNING AND AT BEDTIME. 90 capsule 3   VIMPAT  100 MG TABS TAKE 1 TABLET IN THE MORNING AND TAKE 2 TABLETS AT NIGHT 270 tablet 5   Zavegepant HCl (ZAVZPRET ) 10 MG/ACT SOLN Place 10 mg into the nose as needed (Migraines). 6 each  6   Atogepant  (QULIPTA ) 60 MG TABS Take 1 tablet (60 mg total) by mouth daily at 12 noon. (Patient not taking: Reported on 09/09/2024) 30 tablet 11   Vitamin D , Ergocalciferol , (DRISDOL ) 1.25 MG (50000 UNIT) CAPS capsule TAKE 1 CAPSULE (50,000 UNITS TOTAL) BY MOUTH EVERY 7 (SEVEN) DAYS (Patient not taking: Reported on 09/09/2024) 12 capsule 0   No facility-administered medications prior to visit.    PAST MEDICAL HISTORY: Past Medical History:  Diagnosis Date   Afib (HCC)    only per portocath removal.   ALLERGIC RHINITIS 12/26/2009   Atrial fibrillation (HCC) 04/08/2012   Formatting of this note might be different from the original.  Last Assessment & Plan:   One episode during a procedure in July. Likely induced by the wire. No recurrence. He is in normal sinus rhythm today. No further workup or treatment.   Overview:   Paroxysmal - occurred during a procedure - has been anticoagulated with Coumadin  but initiated due to DVT  Formatting of this note might be differ   Blood clot in vein 2015   Brown's syndrome    Complication of anesthesia    seizure after pac surgery at [facility] 2013 had to be intubated, no other anes issurs   Growth disorder    Headache    migraines   Heart murmur    moderate MR ses dr nicanor   Hyperlipidemia    No therapy   Hypokalemia    Mental retardation, mild (I.Q. 50-70)    MITRAL VALVE PROLAPSE 06/22/2007   SEIZURE DISORDER 06/22/2007   last seizure 08-02-2019   Sleep apnea    cpap , last sleep study 10/01/11   Unspecified hypothyroidism 06/22/2007    PAST SURGICAL HISTORY: Past Surgical History:  Procedure Laterality Date   ANKLE SURGERY Left    fused does not bend, wears right leg brace  2018 at [facility]   BIOPSY  08/22/2019   Procedure: BIOPSY;  Surgeon: Shila Gustav GAILS, MD;  Location: [facility];  Service: Endoscopy;;   BIOPSY  10/02/2022   Procedure: BIOPSY;  Surgeon: Shila Gustav GAILS, MD;  Location: [facility];  Service: Gastroenterology;;   CATARACT EXTRACTION     2021   COLONOSCOPY WITH PROPOFOL  N/A 08/22/2019   Procedure: COLONOSCOPY WITH PROPOFOL ;  Surgeon: Shila Gustav GAILS, MD;  Location: [facility];  Service: Endoscopy;  Laterality: N/A;   COLONOSCOPY WITH PROPOFOL  N/A 10/02/2022   Procedure: COLONOSCOPY WITH PROPOFOL ;  Surgeon: Shila Gustav GAILS, MD;  Location: [facility];  Service: Gastroenterology;  Laterality: N/A;   EYE SURGERY     X2 for Brown Syndrome   HEMOSTASIS CLIP PLACEMENT  08/22/2019   Procedure: HEMOSTASIS CLIP PLACEMENT;  Surgeon: Shila Gustav GAILS, MD;  Location: [facility];  Service: Endoscopy;;   IMPLANTATION VAGAL NERVE STIMULATOR     Left leaves on for surgeries, new battery 2022, first put in 2005.   POLYPECTOMY  08/22/2019   Procedure: POLYPECTOMY;  Surgeon: Shila Gustav GAILS, MD;  Location: [facility];  Service: Endoscopy;;   PORTACATH PLACEMENT  2013 and removed and new placed   right chest    FAMILY HISTORY: Family History  Problem Relation Age of Onset   Breast cancer Mother    Colon polyps Mother    Clotting disorder Mother    Diabetes Father    Hypertension Father    Colon polyps Father    Hyperlipidemia Father    Atrial fibrillation Father    Diabetes Maternal Grandmother    Heart  disease Maternal Grandmother    Diabetes Maternal Grandfather    Heart disease Maternal Grandfather    Coronary artery disease Maternal Grandfather    Congestive Heart Failure Maternal Grandfather        Died at 38   Pneumonia Maternal Grandfather    Diabetes Paternal Grandmother    Heart disease Paternal Grandmother    Pneumonia Paternal Grandmother        Died at 75   Stroke Paternal Grandmother    Diabetes  Paternal Grandfather    Heart disease Paternal Grandfather    Coronary artery disease Paternal Grandfather    Lung cancer Paternal Grandfather        Died at 34    SOCIAL HISTORY: Social History   Socioeconomic History   Marital status: Single    Spouse name: Not on file   Number of children: 0   Years of education: Not on file   Highest education level: Not on file  Occupational History   Occupation: Disabled    Employer: UNEMPLOYED  Tobacco Use   Smoking status: Never    Passive exposure: Never   Smokeless tobacco: Never  Vaping Use   Vaping status: Never Used  Substance and Sexual Activity   Alcohol use: No    Alcohol/week: 0.0 standard drinks of alcohol   Drug use: No   Sexual activity: Never  Other Topics Concern   Not on file  Social History Narrative   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 Disabled.    He lives with his parents.    He enjoys helping around the house, Nascar, and football.   Caffiene soda 's with caffeien 2-3 / day   Social Drivers of Health   Tobacco Use: Low Risk (09/09/2024)   Patient History    Smoking Tobacco Use: Never    Smokeless Tobacco Use: Never    Passive Exposure: Never  Financial Resource Strain: Low Risk (05/02/2024)   Received from [facility]   Overall Financial Resource Strain (CARDIA)    How hard is it for you to pay for the very basics like food, housing, medical care, and heating?: Not hard at all  Food Insecurity: No Food Insecurity (05/02/2024)   [facility]   Epic    Within the past 12 months, you worried that your food would run out before you got the money to buy more.: Never true    Within the past 12 months, the food you bought just didn't last and you didn't have money to get more.: Never true  Transportation Needs: No Transportation Needs (05/02/2024)   Received from [facility]    In the past 12 months, has lack of  transportation kept you from medical appointments or from getting medications?: No    In the past 12 months, has lack of transportation kept you from meetings, work, or from getting things needed for daily living?: No  Physical Activity: Inactive (05/02/2024)   Received from [facility]   Exercise Vital Sign    On average, how many days per week do you engage in moderate to strenuous exercise (like a brisk walk)?: 0 days    Minutes of Exercise per Session: Not on file  Stress: No Stress Concern Present (05/02/2024)   Received from [facility] - Occupational Stress Questionnaire    Do you feel stress - tense,  restless, nervous, or anxious, or unable to sleep at night because your mind is troubled all the time - these days?: Not at all  Social Connections: Socially Integrated (05/02/2024)   Received from [facility]   Social Network    How would you rate your social network (family, work, friends)?: Good participation with social networks  Intimate Partner Violence: Patient Declined (05/02/2024)   Received from [facility]   HITS    Over the last 12 months how often did your partner physically hurt you?: Patient declined    Over the last 12 months how often did your partner insult you or talk down to you?: Patient declined    Over the last 12 months how often did your partner threaten you with physical harm?: Patient declined    Over the last 12 months how often did your partner scream or curse at you?: Patient declined  Depression (PHQ2-9): Not on file  Alcohol Screen: Not on file  Housing: Unknown (05/02/2024)   Received from [facility]    In the last 12 months, was there a time when you were not able to pay the mortgage or rent on time?: Patient declined    In the past 12 months, how many times have you moved where you were living?: 0    At any time in the past 12 months, were you homeless or living in a shelter (including now)?:  No  Utilities: Not At Risk (05/02/2024)   Received from [facility]    In the past 12 months has the electric, gas, oil, or water  company threatened to shut off services in your home?: No  Health Literacy: Not on file    PHYSICAL EXAM  GENERAL EXAM/CONSTITUTIONAL: Vitals:  Vitals:   09/09/24 1035  BP: (!) 88/60  Pulse: 76  Weight: 132 lb (59.9 kg)  Height: 5' 3 (1.6 m)    Body mass index is 23.38 kg/m. Wt Readings from Last 3 Encounters:  09/09/24 132 lb (59.9 kg)  06/27/24 124 lb (56.2 kg)  06/09/24 129 lb (58.5 kg)   Patient is in no distress; well developed, nourished and groomed; neck is supple  MUSCULOSKELETAL: Gait, strength, tone, movements noted in Neurologic exam below  NEUROLOGIC: MENTAL STATUS:      No data to display         awake, alert, oriented to person Answering appropriately Able to follow commands and direction  Able to track examiner  Normal bulk and tone, he does have contraction  Sensory normal to light touch Clumsy gait and poor balance, he does need assistance with ambulation     DIAGNOSTIC DATA (LABS, IMAGING, TESTING) - I reviewed patient records, labs, notes, testing and imaging myself where available.  Lab Results  Component Value Date   WBC 5.3 02/28/2024   HGB 13.0 02/28/2024   HCT 40.1 02/28/2024   MCV 86.2 02/28/2024   PLT 241 02/28/2024      Component Value Date/Time   NA 139 02/28/2024 1550   NA 139 12/22/2023 0836   K 3.7 02/28/2024 1550   CL 105 02/28/2024 1550   CO2 20 (L) 02/28/2024 1550   GLUCOSE 89 02/28/2024 1550   BUN 14 02/28/2024 1550   BUN 13 12/22/2023 0836   CREATININE 0.91 02/28/2024 1550   CALCIUM  8.8 (L) 02/28/2024 1550   PROT 6.4 12/22/2023 0836   ALBUMIN 4.1 12/22/2023 0836   AST 14 12/22/2023 0836   ALT 12 12/22/2023 0836  ALKPHOS 80 12/22/2023 0836   BILITOT 0.3 12/22/2023 0836   GFRNONAA >60 02/28/2024 1550   GFRAA >60 12/06/2019 1158   Lab Results  Component Value Date    TRIG 187 (H) 02/13/2018   No results found for: HGBA1C No results found for: VITAMINB12 Lab Results  Component Value Date   TSH 0.040 (L) 12/22/2023    CT Head 11/19/2022 1. 4 mm thick hypodense subdural collection over the left cerebral convexity without mass effect or midline shift is likely subacute to chronic. No evidence of acute blood products. 2. Otherwise, no evidence of acute intracranial pathology. 3. Unchanged dense basal ganglia calcifications   EEG 11/20/22: - Intermittent slow, generalized and maximal left temporal    ASSESSMENT AND PLAN  38 y.o. year old male  with past medical history of intractable epilepsy status post VNS placement, intellectual disability, obstructive sleep apnea on CPAP, hypothyroidism, chronic migraines who is presenting for follow up for his intractable epilepsy, possibly Lennox-Gastaut syndrome.  His seizures improved to 3 seizures in the past 3 months when he used to have 2-3 seizures per month since the increase of Trokendi .  At 1 point we discussed EMU admission for medication optimization but parents are comfortable with his current seizure frequency and would like to hold the EMU admission.  The plan was to for admission to discontinue Tegretol  and maybe maximize his other medications.  But for now we will continue Trokendi  to 200/300 mg, levetiracetam  1500 mg twice daily, Tegretol  150 mg 3 times daily, Vimpat  100/200 mg and clobazam  10/20 mg.  I will see him in 6 months for follow-up.  Return sooner if worse.  At next visit we will discuss reducing Depakote   1. 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 S/P placement of VNS (vagus nerve stimulation) device   3. Severe obstructive sleep apnea   4. Chronic migraine without aura without status migrainosus, not intractable      Patient Instructions  Continue current medication including   Levetiracetam  1500 mg twice daily  Clobazam  10/20 mg  Trokendi  200/300  mg  Vimpat  100/200 mg  Tegretol  150 mg 3 times daily At next visit, plan will be to start reducing Tegretol  in most likely increasing Vimpat  to 200 mg twice daily We will also plan for additional VNS setting changes I will see him in 6 months for follow-up or sooner if worse   Per [place]  DMV statutes, patients with seizures are not allowed to drive until they have been seizure-free for six months.  Other recommendations include using caution when using heavy equipment or power tools. Avoid working on ladders or at heights. Take showers instead of baths.  Do not swim alone.  Ensure the water  temperature is not too high on the home water  heater. Do not go swimming alone. Do not lock yourself in a room alone (i.e. bathroom). When caring for infants or small children, sit down when holding, feeding, or changing them to minimize risk of injury to the child in the event you have a seizure. Maintain good sleep hygiene. Avoid alcohol.  Also recommend adequate sleep, hydration, good diet and minimize stress.   During the Seizure  - First, ensure adequate ventilation and place patients on the floor on their left side  Loosen clothing around the neck and ensure the airway is patent. If the patient is clenching the teeth, do not force the mouth open with any object as this can cause severe damage - Remove all  items from the surrounding that can be hazardous. The patient may be oblivious to what's happening and may not even know what he or she is doing. If the patient is confused and wandering, either gently guide him/her away and block access to outside areas - Reassure the individual and be comforting - Call 911. In most cases, the seizure ends before EMS arrives. However, there are cases when seizures may last over 3 to 5 minutes. Or the individual may have developed breathing difficulties or severe injuries. If a pregnant patient or a person with diabetes develops a seizure, it is prudent to call an  ambulance. - Finally, if the patient does not regain full consciousness, then call EMS. Most patients will remain confused for about 45 to 90 minutes after a seizure, so you must use judgment in calling for help. - Avoid restraints but make sure the patient is in a bed with padded side rails - Place the individual in a lateral position with the neck slightly flexed; this will help the saliva drain from the mouth and prevent the tongue from falling backward - Remove all nearby furniture and other hazards from the area - Provide verbal assurance as the individual is regaining consciousness - Provide the patient with privacy if possible - Call for help and start treatment as ordered by the caregiver   After the Seizure (Postictal Stage)  After a seizure, most patients experience confusion, fatigue, muscle pain and/or a headache. Thus, one should permit the individual to sleep. For the next few days, reassurance is essential. Being calm and helping reorient the person is also of importance.  Most seizures are painless and end spontaneously. Seizures are not harmful to others but can lead to complications such as stress on the lungs, brain and the heart. Individuals with prior lung problems may develop labored breathing and respiratory distress.     No orders of the defined types were placed in this encounter.   No orders of the defined types were placed in this encounter.   Return in about 6 months (around 03/10/2025).  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  This provider is the continuing focal point for all needed services for this condition.   Pastor Falling, MD 09/09/2024, 11:00 AM  [facility] [address] [place], [place] [address] [phone]  "

## 2024-09-09 NOTE — Patient Instructions (Addendum)
 Continue current medication including   Levetiracetam  1500 mg twice daily  Clobazam  10/20 mg  Trokendi  200/300 mg  Vimpat  100/200 mg  Tegretol  150 mg 3 times daily At next visit, plan will be to start reducing Tegretol  in most likely increasing Vimpat  to 200 mg twice daily We will also plan for additional VNS setting changes I will see him in 6 months for follow-up or sooner if worse

## 2024-09-12 ENCOUNTER — Telehealth: Payer: Self-pay | Admitting: Neurology

## 2024-09-12 NOTE — Telephone Encounter (Signed)
 Called Pt mother back Informed that once she has received Pt up to date Insurance card to give us  a call so PA time can contact insurance . Pt mother stated she will followup once received

## 2024-09-12 NOTE — Telephone Encounter (Signed)
 Pt mother called stating that Pt insurance is changing and Md have to  reach out to Insurance to see if Pt can get an special exemption   on mediation prescribe by MD  Exemptions for   [name]  10 MG tablet VIMPAT  100 MG TABS  Topiramate  ER (TROKENDI  XR) 100 MG CP24  Topiramate  ER (TROKENDI  XR) 200 MG CP24 Zavegepant HCl (ZAVZPRET ) 10 MG/ACT SOLN

## 2024-09-13 ENCOUNTER — Ambulatory Visit (HOSPITAL_COMMUNITY)
Admission: RE | Admit: 2024-09-13 | Discharge: 2024-09-13 | Disposition: A | Discharge status: Home / Self Care | Source: Ambulatory Visit | Attending: Cardiovascular Disease | Admitting: Cardiovascular Disease

## 2024-09-13 DIAGNOSIS — I340 Nonrheumatic mitral (valve) insufficiency: Secondary | ICD-10-CM | POA: Diagnosis present

## 2024-09-14 ENCOUNTER — Ambulatory Visit: Payer: Self-pay | Admitting: Cardiovascular Disease

## 2024-09-14 LAB — ECHOCARDIOGRAM COMPLETE
Area-P 1/2: 4.29 cm2
S' Lateral: 3.7 cm

## 2024-09-23 ENCOUNTER — Telehealth: Payer: Self-pay

## 2024-09-23 ENCOUNTER — Other Ambulatory Visit (HOSPITAL_COMMUNITY): Payer: Self-pay

## 2024-09-23 NOTE — Telephone Encounter (Signed)
 Received request from pharmacy to do PA for BRAND [name] -no insurance is pulling up-sent message requesting updated insurance info for 2026. Per previous message PT Mother was going to send updated new insurance info to [facility] but has not yet.

## 2024-09-26 ENCOUNTER — Other Ambulatory Visit (HOSPITAL_COMMUNITY): Payer: Self-pay

## 2024-09-26 ENCOUNTER — Telehealth: Payer: Self-pay | Admitting: Neurology

## 2024-09-26 ENCOUNTER — Telehealth: Payer: Self-pay

## 2024-09-26 NOTE — Telephone Encounter (Signed)
 Pharmacy Patient Advocate Encounter   Received notification from RX Request Messages that prior authorization for Topiramate  ER 200mg  er capsule is required/requested.   Insurance verification completed.   The patient is insured through [employer].   Per test claim: PA required; PA submitted to above mentioned insurance via Latent Key/confirmation #/EOC [id number] Status is pending

## 2024-09-26 NOTE — Telephone Encounter (Signed)
 Pharmacy Patient Advocate Encounter   Received notification from Patient Advice Request messages that prior authorization for Zavzpret  is required/requested.   Insurance verification completed.   The patient is insured through [place].   Per test claim: PA required; PA submitted to above mentioned insurance via Latent Key/confirmation #/EOC [id number] Status is pending

## 2024-09-26 NOTE — Telephone Encounter (Signed)
 Pharmacy Patient Advocate Encounter   Received notification from Patient Advice Request messages that prior authorization for Topiramate  ER 100mg  er capsules is required/requested.   Insurance verification completed.   The patient is insured through [place].   Per test claim: PA required; PA submitted to above mentioned insurance via Latent Key/confirmation #/EOC [id number] Status is pending

## 2024-09-26 NOTE — Telephone Encounter (Signed)
 Pharmacy Patient Advocate Encounter   Received notification from Patient Advice Request messages that prior authorization for [name]  is required/requested.   Insurance verification completed.   The patient is insured through [place].   Per test claim: PA required; PA submitted to above mentioned insurance via Latent Key/confirmation #/EOC [id number] Status is pending

## 2024-09-26 NOTE — Telephone Encounter (Signed)
 PA request has been Submitted. New Encounter has been or will be created for follow up. For additional info see Pharmacy Prior Auth telephone encounter from 09/26/2024.

## 2024-09-26 NOTE — Telephone Encounter (Signed)
 Pt mother called stating that pt has new Insurance as of  this year  and PA is need for al his medication   [name]  10 MG tablet Topiramate  ER (TROKENDI  XR) 100 MG CP24  Topiramate  ER (TROKENDI  XR) 200 MG CP24  VIMPAT  100 MG TABS Zavegepant HCl (ZAVZPRET ) 10 MG/ACT SOLN

## 2024-09-26 NOTE — Telephone Encounter (Signed)
 Pharmacy Patient Advocate Encounter   Received notification from Patient Advice Request messages that prior authorization for Vimpat  is required/requested.   Insurance verification completed.   The patient is insured through [place].   Per test claim: PA required; PA submitted to above mentioned insurance via Latent Key/confirmation #/EOC [id number] Status is pending

## 2024-09-27 ENCOUNTER — Other Ambulatory Visit (HOSPITAL_COMMUNITY): Payer: Self-pay

## 2024-09-27 NOTE — Telephone Encounter (Signed)
 Pharmacy Patient Advocate Encounter  Received notification from [employer] that Prior Authorization for [name]  [name] has been APPROVED from 09/27/2024 to 09/21/2025. Ran test claim, Copay is $0. This test claim was processed through [facility]- copay amounts may vary at other pharmacies due to pharmacy/plan contracts, or as the patient moves through the different stages of their insurance plan.   PA #/Case ID/Reference #: [id number]

## 2024-09-27 NOTE — Telephone Encounter (Signed)
 Pharmacy Patient Advocate Encounter  Received notification from [employer] that Prior Authorization for VIMPAT  has been APPROVED from 09/27/2024 to 09/21/2025. Ran test claim, Copay is $0 per 30DS/90 Tablets. This test claim was processed through [facility]- copay amounts may vary at other pharmacies due to pharmacy/plan contracts, or as the patient moves through the different stages of their insurance plan.  Plan will not cover or pay for a 90DS. PA #/Case ID/Reference #: [id number]

## 2024-09-27 NOTE — Telephone Encounter (Signed)
 Pharmacy Patient Advocate Encounter  Received notification from [employer] that Prior Authorization for Tpoiramate ER 100mg  er capsules has been APPROVED from 09/26/2024 to 09/21/2025. Ran test claim, Copay is $0. This test claim was processed through [facility]- copay amounts may vary at other pharmacies due to pharmacy/plan contracts, or as the patient moves through the different stages of their insurance plan.   PA #/Case ID/Reference #: [id number]

## 2024-09-27 NOTE — Telephone Encounter (Signed)
 Pharmacy Patient Advocate Encounter  Received notification from [employer] that Prior Authorization for Topiramate  ER 200mg  er capsules  has been APPROVED from 09/26/2024 to 09/21/2025. Ran test claim, Copay is $0. This test claim was processed through [facility]- copay amounts may vary at other pharmacies due to pharmacy/plan contracts, or as the patient moves through the different stages of their insurance plan.   PA #/Case ID/Reference #: [id number]

## 2024-09-27 NOTE — Telephone Encounter (Signed)
 Pharmacy Patient Advocate Encounter  Received notification from [employer] that Prior Authorization for Zavzpret  has been APPROVED from 09/26/2024 to 09/21/2025. Ran test claim, Copay is $0. This test claim was processed through [facility]- copay amounts may vary at other pharmacies due to pharmacy/plan contracts, or as the patient moves through the different stages of their insurance plan.   PA #/Case ID/Reference #: [id number]

## 2024-10-03 ENCOUNTER — Ambulatory Visit: Admitting: Neurology

## 2024-10-03 ENCOUNTER — Encounter: Payer: Self-pay | Admitting: Neurology

## 2024-10-03 ENCOUNTER — Telehealth: Payer: Self-pay | Admitting: Neurology

## 2024-10-03 VITALS — BP 91/58 | HR 77 | Ht 63.0 in | Wt 128.8 lb

## 2024-10-03 DIAGNOSIS — G4733 Obstructive sleep apnea (adult) (pediatric): Secondary | ICD-10-CM

## 2024-10-03 NOTE — Patient Instructions (Signed)
 Please continue using your CPAP regularly. While your insurance requires that you use CPAP at least 4 hours each night on 70% of the nights, I recommend, that you not skip any nights and use it throughout the night if you can. Getting used to CPAP and staying with the treatment long term does take time and patience and discipline. Untreated obstructive sleep apnea when it is moderate to severe can have an adverse impact on cardiovascular health and raise her risk for heart disease, arrhythmias, hypertension, congestive heart failure, stroke and diabetes. Untreated obstructive sleep apnea causes sleep disruption, nonrestorative sleep, and sleep deprivation. This can have an impact on your day to day functioning and cause daytime sleepiness and impairment of cognitive function, memory loss, mood disturbance, and problems focussing. Using CPAP regularly can improve these symptoms. Follow-up in a year to see Greig Forbes, NP for your sleep apnea checkup.

## 2024-10-03 NOTE — Telephone Encounter (Signed)
 Pt mom dropped off form for handicap license plate. Left in POD 4 in [place] bin, please call mom to pick up completed form

## 2024-10-03 NOTE — Progress Notes (Signed)
 Subjective:    Patient ID: [name] is a 39 y.o. male.  HPI    Interim history:   [name] is a 39 year old male with an underlying medical history of seizure disorder, status post [name] placement, s/p VNS, intellectual disability, Brown syndrome, allergic rhinitis, history of blood clot, headaches, hyperlipidemia, mitral valve prolapse, and hypothyroidism, who presents for follow-up consultation of his obstructive sleep apnea, on CPAP therapy.  The patient is unaccompanied today.  He is followed regularly by Dr. Gregg for his epilepsy and saw Greig Forbes, NP in May 2024 for sleep apnea follow-up at which time he was compliant with his CPAP.  Today, 10/03/2024: I reviewed his CPAP compliance data from 08/31/2024 through 09/29/2024, during which time he used his machine every night with percent use days greater than 4 hours at 97%, indicating excellent compliance with an average usage high at 11 hours and 29 minutes, residual AHI at goal, 0.1/h, leak on the higher side with the 95th percentile at 22.4 L/min on a set pressure of 8 cm without EPR.  He reports very little, has no trouble with his CPAP and continues to be compliant with it.  He uses nasal pillows for his interface per mom and is up-to-date with supplies through [employer].  He had a home sleep test through our office on 10/21/2022 which showed severe obstructive sleep apnea with a total AHI of 39/hour and O2 nadir of 64%.  She reports that he rests a lot during the day and also naps during the day and puts his CPAP on when napping.  He has an appointment with cardiology later this month to evaluate his mitral valve prolapse again.  Blood pressure is on the mildly low side today but he is not with any acute symptoms. He likes to drink diet sodas, about 3 cans/day.  The patient's allergies, current medications, family history, past medical history, past social history, past surgical history and problem list were  reviewed and updated as appropriate.   Previously:    01/29/2023 Darra Forbes, NP): <<[name] is a 39 y.o. male here today for follow up for OSA on CPAP. He was seen in consult with Dr Buck 08/2022 and needing new machine. HST 09/2022 showed severe obstructive sleep apnea with a total AHI of 39/hour and O2 nadir of 64% with significant time below or at 88% saturation of over 90 minutes, indicating nocturnal hypoxemia.     Refractory seizure and migraine management currently through Dr Adriana Hazel [facility]. He is also followed Q63mth with Dr Jolyn with [facility] per family preference. He has an appt with Dr Gregg 02/04/2023 for consult to transfer care due to insurance. He continues ASMs as prescribed. Has break through events every 2 weeks, on average. Using VNS for some events but not all. Has port-a-cath for abortive med administration.  >>  09/08/2022 (SA): (He) was previously diagnosed with obstructive sleep apnea.  Patient is unable to provide a detailed history but is able to answer simple questions appropriately.  History is provided by both parents.  He is compliant with his CPAP machine, he uses nasal pillows.  His machine has made a louder noise which is unusual and also they have seen an error message that the motor life has been exceeded.  His bedtime is generally around 8:30 PM or 9 PM and rise time around 8 AM.  He does not have nightly nocturia.  He does not have recurrent headaches.  He  lives with his parents and his seizure dog Apollo.  His DME company is [employer].  4 nights a week he spends at his sister's house.  She has 2 daughters, ages 7 and 81 and he likes to spend time with them. Both parents have sleep apnea, father still uses a CPAP machine, mom no longer has to use her machine. I reviewed your office note from 04/25/2022.  He was diagnosed about 10 years ago.  He has an older CPAP machine.  He has had weight gain since his original sleep apnea  diagnosis.  His Epworth sleepiness score is 11 out of 24, fatigue severity score is 63 out of 63.  He lives with his parents.  He is on disability, he has no children.  He is a non-smoker and does not utilize alcohol and drinks caffeine in the form of sodas, 2 to 3/day on average. Reviewed his split-night sleep study report from 10/01/2011.  Study was interpreted by Dr. Dedra Dohmeier, baseline AHI was 33.1/h, O2 nadir 68%.  He had a split-night sleep study.  Optimal pressure was 8 cm.  I was able to review his compliance data from his current CPAP machine, he has a ResMed air sense 10 CPAP machine, compliance data from 08/10/2022 through 09/08/2022 showed 100% compliance.  Average usage of 11 hours and 8 minutes, residual AHI at goal at 0.6/h, leak acceptable with the 95th percentile at 9 L/min on a pressure of 8 cm without EPR.   His Past Medical History Is Significant For: Past Medical History:  Diagnosis Date   Afib (HCC)    only per portocath removal.   ALLERGIC RHINITIS 12/26/2009   Atrial fibrillation (HCC) 04/08/2012   Formatting of this note might be different from the original.  Last Assessment & Plan:   One episode during a procedure in July. Likely induced by the wire. No recurrence. He is in normal sinus rhythm today. No further workup or treatment.   Overview:   Paroxysmal - occurred during a procedure - has been anticoagulated with Coumadin  but initiated due to DVT  Formatting of this note might be differ   Blood clot in vein 2015   Brown's syndrome    Complication of anesthesia    seizure after pac surgery at [facility] 2013 had to be intubated, no other anes issurs   Growth disorder    Headache    migraines   Heart murmur    moderate MR ses dr nicanor   Hyperlipidemia    No therapy   Hypokalemia    Mental retardation, mild (I.Q. 50-70)    MITRAL VALVE PROLAPSE 06/22/2007   SEIZURE DISORDER 06/22/2007   last seizure 08-02-2019   Sleep apnea    cpap , last sleep study 10/01/11    Unspecified hypothyroidism 06/22/2007    His Past Surgical History Is Significant For: Past Surgical History:  Procedure Laterality Date   ANKLE SURGERY Left    fused does not bend, wears right leg brace 2018 at [facility]   BIOPSY  08/22/2019   Procedure: BIOPSY;  Surgeon: Shila Gustav GAILS, MD;  Location: [facility];  Service: Endoscopy;;   BIOPSY  10/02/2022   Procedure: BIOPSY;  Surgeon: Shila Gustav GAILS, MD;  Location: [facility];  Service: Gastroenterology;;   CATARACT EXTRACTION     2021   COLONOSCOPY WITH PROPOFOL  N/A 08/22/2019   Procedure: COLONOSCOPY WITH PROPOFOL ;  Surgeon: Shila Gustav GAILS, MD;  Location: [facility];  Service: Endoscopy;  Laterality: N/A;  COLONOSCOPY WITH PROPOFOL  N/A 10/02/2022   Procedure: COLONOSCOPY WITH PROPOFOL ;  Surgeon: Shila Gustav GAILS, MD;  Location: [facility];  Service: Gastroenterology;  Laterality: N/A;   EYE SURGERY     X2 for Brown Syndrome   HEMOSTASIS CLIP PLACEMENT  08/22/2019   Procedure: HEMOSTASIS CLIP PLACEMENT;  Surgeon: Shila Gustav GAILS, MD;  Location: [facility];  Service: Endoscopy;;   IMPLANTATION VAGAL NERVE STIMULATOR     Left leaves on for surgeries, new battery 2022, first put in 2005.   POLYPECTOMY  08/22/2019   Procedure: POLYPECTOMY;  Surgeon: Shila Gustav GAILS, MD;  Location: [facility];  Service: Endoscopy;;   PORTACATH PLACEMENT  2013 and removed and new placed   right chest    His Family History Is Significant For: Family History  Problem Relation Age of Onset   Breast cancer Mother    Colon polyps Mother    Clotting disorder Mother    Sleep apnea Mother    Diabetes Father    Hypertension Father    Colon polyps Father    Hyperlipidemia Father    Atrial fibrillation Father    Sleep apnea Father    Diabetes Maternal Grandmother    Heart disease Maternal Grandmother    Diabetes Maternal Grandfather    Heart disease Maternal Grandfather    Coronary artery disease Maternal  Grandfather    Congestive Heart Failure Maternal Grandfather        Died at 43   Pneumonia Maternal Grandfather    Diabetes Paternal Grandmother    Heart disease Paternal Grandmother    Pneumonia Paternal Grandmother        Died at 76   Stroke Paternal Grandmother    Diabetes Paternal Grandfather    Heart disease Paternal Grandfather    Coronary artery disease Paternal Grandfather    Lung cancer Paternal Grandfather        Died at 27    His Social History Is Significant For: Social History   Socioeconomic History   Marital status: Single    Spouse name: Not on file   Number of children: 0   Years of education: Not on file   Highest education level: Not on file  Occupational History   Occupation: Disabled    Employer: UNEMPLOYED  Tobacco Use   Smoking status: Never    Passive exposure: Never   Smokeless tobacco: Never  Vaping Use   Vaping status: Never Used  Substance and Sexual Activity   Alcohol use: No    Alcohol/week: 0.0 standard drinks of alcohol   Drug use: No   Sexual activity: Never  Other Topics Concern   Not on file  Social History Narrative   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 Disabled.    He lives with his parents.    He enjoys helping around the house, Nascar, and football.   Caffiene soda 's with caffeien 2-3 / day   Social Drivers of Health   Tobacco Use: Low Risk (10/03/2024)   Patient History    Smoking Tobacco Use: Never    Smokeless Tobacco Use: Never    Passive Exposure: Never  Financial Resource Strain: Low Risk (05/02/2024)   Received from [facility]   Overall Financial Resource Strain (CARDIA)    How hard is it for you to pay for the very basics like food, housing, medical care, and heating?: Not hard at all  Food Insecurity: No Food  Insecurity (05/02/2024)   Received [facility]   Epic    Within the past 12 months, you worried that your food would run out  before you got the money to buy more.: Never true    Within the past 12 months, the food you bought just didn't last and you didn't have money to get more.: Never true  Transportation Needs: No Transportation Needs (05/02/2024)   Received from [facility]    In the past 12 months, has lack of transportation kept you from medical appointments or from getting medications?: No    In the past 12 months, has lack of transportation kept you from meetings, work, or from getting things needed for daily living?: No  Physical Activity: Inactive (05/02/2024)   Received from [facility]   Exercise Vital Sign    On average, how many days per week do you engage in moderate to strenuous exercise (like a brisk walk)?: 0 days    Minutes of Exercise per Session: Not on file  Stress: No Stress Concern Present (05/02/2024)   Received from [facility] - Occupational Stress Questionnaire    Do you feel stress - tense, restless, nervous, or anxious, or unable to sleep at night because your mind is troubled all the time - these days?: Not at all  Social Connections: Socially Integrated (05/02/2024)   Received from [facility]   Social Network    How would you rate your social network (family, work, friends)?: Good participation with social networks  Depression (PHQ2-9): Not on file  Alcohol Screen: Not on file  Housing: Unknown (05/02/2024)   Received from [facility]    In the last 12 months, was there a time when you were not able to pay the mortgage or rent on time?: Patient declined    In the past 12 months, how many times have you moved where you were living?: 0    At any time in the past 12 months, were you homeless or living in a shelter (including now)?: No  Utilities: Not At Risk (05/02/2024)   Received from [facility]    In the past 12 months has the electric, gas, oil, or water  company threatened to shut off services in your  home?: No  Health Literacy: Not on file    His Allergies Are:  Allergies[1]:   His Current Medications Are:  Outpatient Encounter Medications as of 10/03/2024  Medication Sig   albuterol  (ACCUNEB ) 1.25 MG/3ML nebulizer solution Take 1 ampule by nebulization every 4 (four) hours as needed for wheezing.   azelastine (ASTELIN) 0.1 % nasal spray 2 sprays. (Patient taking differently: 2 sprays as needed.)   Calcium -Vitamin D -Vitamin K (VIACTIV CALCIUM  PLUS D) 650-12.5-40 MG-MCG-MCG CHEW Chew 1 each by mouth daily.   carbamazepine  (TEGRETOL ) 100 MG chewable tablet CHEW 1.5 TABLETS (150 MG TOTAL) BY MOUTH 3 (THREE) TIMES DAILY.   cetirizine (ZYRTEC) 10 MG tablet Take 10 mg by mouth daily.   diazePAM , 20 MG Dose, (VALTOCO  20 MG DOSE) 2 x 10 MG/0.1ML LQPK Place 20 mg into the nose as needed (for seizure lasting more than 2 minutes).   Flaxseed, Linseed, (FLAXSEED OIL) 1000 MG CAPS Take 1,000 mg by mouth daily.   K-PHOS  500 MG tablet Take 1 tablet (500 mg total) by mouth 4 (four) times daily.   levETIRAcetam  (KEPPRA ) 500 MG tablet TAKE 3 TABLETS (1,500 MG TOTAL) BY MOUTH  IN THE MORNING AND AT BEDTIME.   levothyroxine  (SYNTHROID ) 75 MCG tablet Take 75 mcg by mouth daily before breakfast.   montelukast  (SINGULAIR ) 10 MG tablet Take 10 mg by mouth at bedtime.   Multiple Vitamin (MULTIVITAMIN WITH MINERALS) TABS Take 1 tablet by mouth daily.   Omega-3 Fatty Acids (FISH OIL) 500 MG CAPS Take 500 mg by mouth daily.   ondansetron  (ZOFRAN -ODT) 4 MG disintegrating tablet Take 1 tablet (4 mg total) by mouth every 8 (eight) hours as needed for nausea or vomiting.   [name]  10 MG tablet TAKE 1 TABLET BY MOUTH IN THE MORNING, AND 2 AT BEDTIME   PHENObarbital  (LUMINAL) 65 MG/ML injection Withdraw 1ml (65mg ) Phenobarbital  and 4ml Normal Saline into 5ml syringe. Adminster IV push for 3-5 minutes as needed for recurrent seizures   potassium chloride  SA (KLOR-CON  M20) 20 MEQ tablet Take 1 tablet (20 mEq total) by mouth  daily.   promethazine  (PHENERGAN ) 25 MG suppository Place 25 mg rectally every 6 (six) hours as needed for nausea or vomiting.   rosuvastatin  (CRESTOR ) 10 MG tablet Take 10 mg by mouth at bedtime.   terbinafine (LAMISIL) 250 MG tablet Take 250 mg by mouth daily.   Topiramate  ER (TROKENDI  XR) 100 MG CP24 Take 1 capsule (100 mg total) by mouth at bedtime.   Topiramate  ER (TROKENDI  XR) 200 MG CP24 TAKE 1 CAPSULE (200 MG TOTAL) BY MOUTH IN THE MORNING AND AT BEDTIME.   VIMPAT  100 MG TABS TAKE 1 TABLET IN THE MORNING AND TAKE 2 TABLETS AT NIGHT   Zavegepant HCl (ZAVZPRET ) 10 MG/ACT SOLN Place 10 mg into the nose as needed (Migraines).   amoxicillin  (AMOXIL ) 500 MG capsule Take 2,000 mg by mouth. Before dental procedures (Patient not taking: Reported on 10/03/2024)   Atogepant  (QULIPTA ) 60 MG TABS Take 1 tablet (60 mg total) by mouth daily at 12 noon. (Patient not taking: Reported on 09/09/2024)   Vitamin D , Ergocalciferol , (DRISDOL ) 1.25 MG (50000 UNIT) CAPS capsule TAKE 1 CAPSULE (50,000 UNITS TOTAL) BY MOUTH EVERY 7 (SEVEN) DAYS (Patient not taking: Reported on 09/09/2024)   No facility-administered encounter medications on file as of 10/03/2024.  :  Review of Systems:  Out of a complete 14 point review of systems, all are reviewed and negative with the exception of these symptoms as listed below:  Review of Systems  Objective:  Neurological Exam  Physical Exam Physical Examination:   Vitals:   10/03/24 1042  BP: (!) 91/58  Pulse: 77    General Examination: The patient is a 39 year old male in no acute distress.    HEENT: Normocephalic, atraumatic, pupils are equal, round and reactive to light, tracking is impaired, hearing grossly intact, speech is scant and mildly dysarthric, at times difficult to understand. Airway examination reveals stable findings with a small airway.  Small tongue noted, tongue protrudes centrally.    Chest: Clear to auscultation without wheezing, rhonchi or  crackles noted.     Heart: S1+S2+0, regular and normal without murmurs, rubs or gallops noted.    Abdomen: Soft, non-tender and non-distended.   Extremities: There is no obvious edema in the distal lower extremities bilaterally.    Skin: Warm and dry without trophic changes noted.    Musculoskeletal: exam reveals no obvious new deformities, left ankle effusion, right AFO in place.     Neurologically:  Mental status: The patient is awake, alert and pays good attention, able to provide answers to simple questions quite well.   Cranial nerves II -  XII are as described above under HEENT exam.  Motor exam: Normal to thin bulk, moving all 4 extremities, limited range of motion left ankle.  No obvious action or resting tremor.  Fine motor skills and coordination: Mildly impaired globally.    Cerebellar testing: No obvious dysmetria or intention tremor. There is no truncal or gait ataxia.  Sensory exam: intact to light touch in the upper and lower extremities.  Gait, station and balance: He stands without difficulty and does not need assistance.  He walks independently.    Assessment and plan:  In summary, Zaelyn Barbary Doe Perez is a 39 year old male with an underlying medical history of seizure disorder, status post [name] placement, s/p VNS, intellectual disability, Brown syndrome, allergic rhinitis, history of blood clot, headaches, hyperlipidemia, mitral valve prolapse, and hypothyroidism, who presents for follow-up consultation of his obstructive sleep apnea, well-established on CPAP therapy.  He has been on a new CPAP machine since February 2024.  He continues to be fully compliant with treatment and is commended for it.  His mom does endorse that he tends to rest a lot and also naps during the day.  He is up-to-date with his supplies and has no trouble tolerating treatment.  Apnea control is very good.  He is advised to follow-up routinely to see one of our nurse practitioners in about a  year in this clinic for sleep apnea checkup.  I answered all their questions today and the patient and his mom were in agreement.   I spent 30 minutes in total face-to-face time and in reviewing records during pre-charting, more than 50% of which was spent in counseling and coordination of care, reviewing test results, reviewing medications and treatment regimen and/or in discussing or reviewing the diagnosis of OSA, the prognosis and treatment options. Pertinent laboratory and imaging test results that were available during this visit with the patient were reviewed by me and considered in my medical decision making (see chart for details).      [1]  Allergies Allergen Reactions   Antihistamines, Chlorpheniramine-Type Other (See Comments)    Other Reaction: Dimetapp causes seizures  Allergen- Dimetapp, Reaction - seizures   Divalproex Sodium Other (See Comments)    Nausea, vomiting, liver and kidney dysfunction   Phenytoin Rash and Other (See Comments)    Dilantin causes more seizures.    Valproic Acid And Related Other (See Comments)    Shuts down systems   Vancomycin  Other (See Comments), Rash and Swelling    If given too fast, causes red man reaction  Other Reaction: Redman's Syndrome  Other Reaction(s): Angioedema   Antihistamines, Diphenhydramine-Type Other (See Comments)    seizures   Phenytoin Sodium Extended Swelling    Enhances seizures  Other Reaction(s): Angioedema   Valproic Acid Other (See Comments)    Shuts down systems  Other Reaction(s): Angioedema  Starts to shut down body functions   Divalproex Sodium Rash    Nausea, vomiting, liver and kidney dysfunction

## 2024-10-11 NOTE — Progress Notes (Unsigned)
 "    No chief complaint on file.  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 He was initially seen by Dr. Fernande in 2013 after he developed atrial fib following wire manipulation for [name] replacement. No recurrence since then. He has frequent seizures for many years. Cardiac monitor in 2015 showed sinus rhythm with no arrhythmias. Echo May 2021 with LVEF=55-60%, mild to moderate mitral regurgitation. Echo December 2025 with LVEF=50-55%. Moderate to severe MR.   He is here today for follow up. The patient denies any chest pain, dyspnea, palpitations, lower extremity edema, orthopnea, PND, dizziness, near syncope or syncope.    Primary Care Physician: Dorcus Lamar POUR, MD  Past Medical History:  Diagnosis Date   Afib [facility])    only per portocath removal.   ALLERGIC RHINITIS 12/26/2009   Atrial fibrillation (HCC) 04/08/2012   Formatting of this note might be different from the original.  Last Assessment & Plan:   One episode during a procedure in July. Likely induced by the wire. No recurrence. He is in normal sinus rhythm today. No further workup or treatment.   Overview:   Paroxysmal - occurred during a procedure - has been anticoagulated with Coumadin  but initiated due to DVT  Formatting of this note might be differ   Blood clot in vein 2015   Brown's syndrome    Complication of anesthesia    seizure after pac surgery at [facility] 2013 had to be intubated, no other anes issurs   Growth disorder    Headache    migraines   Heart murmur    moderate MR ses dr nicanor   Hyperlipidemia    No therapy   Hypokalemia    Mental retardation, mild (I.Q. 50-70)    MITRAL VALVE PROLAPSE 06/22/2007   SEIZURE DISORDER 06/22/2007   last seizure 08-02-2019    Sleep apnea    cpap , last sleep study 10/01/11   Unspecified hypothyroidism 06/22/2007    Past Surgical History:  Procedure Laterality Date   ANKLE SURGERY Left    fused does not bend, wears right leg brace 2018 at [facility]   BIOPSY  08/22/2019   Procedure: BIOPSY;  Surgeon: Shila Gustav GAILS, MD;  Location: [facility];  Service: Endoscopy;;   BIOPSY  10/02/2022   Procedure: BIOPSY;  Surgeon: Shila Gustav GAILS, MD;  Location: [facility];  Service: Gastroenterology;;   CATARACT EXTRACTION     2021   COLONOSCOPY WITH PROPOFOL  N/A 08/22/2019   Procedure: COLONOSCOPY WITH PROPOFOL ;  Surgeon: Shila Gustav GAILS, MD;  Location: [facility];  Service: Endoscopy;  Laterality: N/A;   COLONOSCOPY WITH PROPOFOL  N/A 10/02/2022   Procedure: COLONOSCOPY WITH PROPOFOL ;  Surgeon: Shila Gustav GAILS, MD;  Location: [facility];  Service: Gastroenterology;  Laterality: N/A;   EYE SURGERY     X2 for Brown Syndrome   HEMOSTASIS CLIP PLACEMENT  08/22/2019   Procedure: HEMOSTASIS CLIP PLACEMENT;  Surgeon: Shila Gustav GAILS, MD;  Location: [facility];  Service: Endoscopy;;   IMPLANTATION VAGAL NERVE STIMULATOR     Left leaves on for surgeries, new battery 2022, first put in 2005.   POLYPECTOMY  08/22/2019   Procedure: POLYPECTOMY;  Surgeon: Shila Gustav GAILS, MD;  Location: [facility];  Service: Endoscopy;;   PORTACATH PLACEMENT  2013 and removed and new placed   right  chest    Current Outpatient Medications  Medication Sig Dispense Refill   albuterol  (ACCUNEB ) 1.25 MG/3ML nebulizer solution Take 1 ampule by nebulization every 4 (four) hours as needed for wheezing.     amoxicillin  (AMOXIL ) 500 MG capsule Take 2,000 mg by mouth. Before dental procedures (Patient not taking: Reported on 10/03/2024)     Atogepant  (QULIPTA ) 60 MG TABS Take 1 tablet (60 mg total) by mouth daily at 12 noon. (Patient not taking: Reported on 09/09/2024) 30 tablet 11   azelastine (ASTELIN) 0.1 % nasal spray 2 sprays.  (Patient taking differently: 2 sprays as needed.)     Calcium -Vitamin D -Vitamin K (VIACTIV CALCIUM  PLUS D) 650-12.5-40 MG-MCG-MCG CHEW Chew 1 each by mouth daily.     carbamazepine  (TEGRETOL ) 100 MG chewable tablet CHEW 1.5 TABLETS (150 MG TOTAL) BY MOUTH 3 (THREE) TIMES DAILY. 405 tablet 1   cetirizine (ZYRTEC) 10 MG tablet Take 10 mg by mouth daily.     diazePAM , 20 MG Dose, (VALTOCO  20 MG DOSE) 2 x 10 MG/0.1ML LQPK Place 20 mg into the nose as needed (for seizure lasting more than 2 minutes). 5 each 5   Flaxseed, Linseed, (FLAXSEED OIL) 1000 MG CAPS Take 1,000 mg by mouth daily.     K-PHOS  500 MG tablet Take 1 tablet (500 mg total) by mouth 4 (four) times daily. 124 tablet 5   levETIRAcetam  (KEPPRA ) 500 MG tablet TAKE 3 TABLETS (1,500 MG TOTAL) BY MOUTH IN THE MORNING AND AT BEDTIME. 540 tablet 3   levothyroxine  (SYNTHROID ) 75 MCG tablet Take 75 mcg by mouth daily before breakfast.     montelukast  (SINGULAIR ) 10 MG tablet Take 10 mg by mouth at bedtime.  6   Multiple Vitamin (MULTIVITAMIN WITH MINERALS) TABS Take 1 tablet by mouth daily.     Omega-3 Fatty Acids (FISH OIL) 500 MG CAPS Take 500 mg by mouth daily.     ondansetron  (ZOFRAN -ODT) 4 MG disintegrating tablet Take 1 tablet (4 mg total) by mouth every 8 (eight) hours as needed for nausea or vomiting. 30 tablet 0   [name]  10 MG tablet TAKE 1 TABLET BY MOUTH IN THE MORNING, AND 2 AT BEDTIME 90 tablet 5   PHENObarbital  (LUMINAL) 65 MG/ML injection Withdraw 1ml (65mg ) Phenobarbital  and 4ml Normal Saline into 5ml syringe. Adminster IV push for 3-5 minutes as needed for recurrent seizures 5 mL 5   potassium chloride  SA (KLOR-CON  M20) 20 MEQ tablet Take 1 tablet (20 mEq total) by mouth daily. 90 tablet 2   promethazine  (PHENERGAN ) 25 MG suppository Place 25 mg rectally every 6 (six) hours as needed for nausea or vomiting.     rosuvastatin  (CRESTOR ) 10 MG tablet Take 10 mg by mouth at bedtime.  5   terbinafine (LAMISIL) 250 MG tablet Take 250 mg by  mouth daily.     Topiramate  ER (TROKENDI  XR) 100 MG CP24 Take 1 capsule (100 mg total) by mouth at bedtime. 90 capsule 3   Topiramate  ER (TROKENDI  XR) 200 MG CP24 TAKE 1 CAPSULE (200 MG TOTAL) BY MOUTH IN THE MORNING AND AT BEDTIME. 90 capsule 3   VIMPAT  100 MG TABS TAKE 1 TABLET IN THE MORNING AND TAKE 2 TABLETS AT NIGHT 270 tablet 5   Vitamin D , Ergocalciferol , (DRISDOL ) 1.25 MG (50000 UNIT) CAPS capsule TAKE 1 CAPSULE (50,000 UNITS TOTAL) BY MOUTH EVERY 7 (SEVEN) DAYS (Patient not taking: Reported on 09/09/2024) 12 capsule 0   Zavegepant HCl (ZAVZPRET ) 10 MG/ACT SOLN Place 10 mg into the  nose as needed (Migraines). 6 each 6   No current facility-administered medications for this visit.    Allergies  Allergen Reactions   Antihistamines, Chlorpheniramine-Type Other (See Comments)    Other Reaction: Dimetapp causes seizures  Allergen- Dimetapp, Reaction - seizures   Divalproex Sodium Other (See Comments)    Nausea, vomiting, liver and kidney dysfunction   Phenytoin Rash and Other (See Comments)    Dilantin causes more seizures.    Valproic Acid And Related Other (See Comments)    Shuts down systems   Vancomycin  Other (See Comments), Rash and Swelling    If given too fast, causes red man reaction  Other Reaction: Redman's Syndrome  Other Reaction(s): Angioedema   Antihistamines, Diphenhydramine-Type Other (See Comments)    seizures   Phenytoin Sodium Extended Swelling    Enhances seizures  Other Reaction(s): Angioedema   Valproic Acid Other (See Comments)    Shuts down systems  Other Reaction(s): Angioedema  Starts to shut down body functions   Divalproex Sodium Rash    Nausea, vomiting, liver and kidney dysfunction    Social History   Socioeconomic History   Marital status: Single    Spouse name: Not on file   Number of children: 0   Years of education: Not on file   Highest education level: Not on file  Occupational History   Occupation: Disabled    Employer:  UNEMPLOYED  Tobacco Use   Smoking status: Never    Passive exposure: Never   Smokeless tobacco: Never  Vaping Use   Vaping status: Never Used  Substance and Sexual Activity   Alcohol use: No    Alcohol/week: 0.0 standard drinks of alcohol   Drug use: No   Sexual activity: Never  Other Topics Concern   Not on file  Social History Narrative   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 Disabled.    He lives with his parents.    He enjoys helping around the house, Nascar, and football.   Caffiene soda 's with caffeien 2-3 / day   Social Drivers of Health   Tobacco Use: Low Risk (10/03/2024)   Patient History    Smoking Tobacco Use: Never    Smokeless Tobacco Use: Never    Passive Exposure: Never  Financial Resource Strain: Low Risk (05/02/2024)   Received from [facility]   Overall Financial Resource Strain (CARDIA)    How hard is it for you to pay for the very basics like food, housing, medical care, and heating?: Not hard at all  Food Insecurity: No Food Insecurity (05/02/2024)   Received from [facility]   Epic    Within the past 12 months, you worried that your food would run out before you got the money to buy more.: Never true    Within the past 12 months, the food you bought just didn't last and you didn't have money to get more.: Never true  Transportation Needs: No Transportation Needs (05/02/2024)   Received from [facility]    In the past 12 months, has lack of transportation kept you from medical appointments or from getting medications?: No    In the past 12 months, has lack of transportation kept you from meetings, work, or from getting things needed for daily living?: No  Physical Activity: Inactive (05/02/2024)   Received from [facility]   Exercise Vital Sign    On average, how  many days per week do you engage in moderate to strenuous exercise (like a brisk walk)?: 0 days    Minutes of  Exercise per Session: Not on file  Stress: No Stress Concern Present (05/02/2024)   Received from [facility] Occupational [facility] - Occupational Stress Questionnaire    Do you feel stress - tense, restless, nervous, or anxious, or unable to sleep at night because your mind is troubled all the time - these days?: Not at all  Social Connections: Socially Integrated (05/02/2024)   Received from [facility]   Social Network    How would you rate your social network (family, work, friends)?: Good participation with social networks  Intimate Partner Violence: Patient Declined (05/02/2024)   Received from [facility]   HITS    Over the last 12 months how often did your partner physically hurt you?: Patient declined    Over the last 12 months how often did your partner insult you or talk down to you?: Patient declined    Over the last 12 months how often did your partner threaten you with physical harm?: Patient declined    Over the last 12 months how often did your partner scream or curse at you?: Patient declined  Depression (PHQ2-9): Not on file  Alcohol Screen: Not on file  Housing: Unknown (05/02/2024)   Received [facility]    In the last 12 months, was there a time when you were not able to pay the mortgage or rent on time?: Patient declined    In the past 12 months, how many times have you moved where you were living?: 0    At any time in the past 12 months, were you homeless or living in a shelter (including now)?: No  Utilities: Not At Risk (05/02/2024)   Received from [facility]    In the past 12 months has the electric, gas, oil, or water  company threatened to shut off services in your home?: No  Health Literacy: Not on file    Family History  Problem Relation Age of Onset   Breast cancer Mother    Colon polyps Mother    Clotting disorder Mother    Sleep apnea Mother    Diabetes Father    Hypertension Father    Colon polyps  Father    Hyperlipidemia Father    Atrial fibrillation Father    Sleep apnea Father    Diabetes Maternal Grandmother    Heart disease Maternal Grandmother    Diabetes Maternal Grandfather    Heart disease Maternal Grandfather    Coronary artery disease Maternal Grandfather    Congestive Heart Failure Maternal Grandfather        Died at 67   Pneumonia Maternal Grandfather    Diabetes Paternal Grandmother    Heart disease Paternal Grandmother    Pneumonia Paternal Grandmother        Died at 79   Stroke Paternal Grandmother    Diabetes Paternal Grandfather    Heart disease Paternal Grandfather    Coronary artery disease Paternal Grandfather    Lung cancer Paternal Grandfather        Died at 38    Review of Systems:  As stated in the HPI and otherwise negative.   There were no vitals taken for this visit.  Physical Examination:  General: Well developed, well nourished, NAD  SKIN: warm, dry. Neuro: No focal deficits  Psychiatric: Mood and  affect normal  Neck: No JVD Lungs:Clear bilaterally, no wheezes, rhonci, crackles Cardiovascular: Regular rate and rhythm. *** Systolic murmur.  Abdomen:Soft.  Extremities: No lower extremity edema.      EKG:  EKG is *** ordered today. The ekg ordered today demonstrates   Recent Labs: 12/22/2023: ALT 12; TSH 0.040 02/28/2024: BUN 14; Creatinine, Ser 0.91; Hemoglobin 13.0; Platelets 241; Potassium 3.7; Sodium 139    Wt Readings from Last 3 Encounters:  10/03/24 128 lb 12.8 oz (58.4 kg)  09/09/24 132 lb (59.9 kg)  06/27/24 124 lb (56.2 kg)    Assessment and Plan:   1. Mitral regurgitation: Moderate to severe MR by echo December 2025. *** ? TEE  Labs/ tests ordered today include:  No orders of the defined types were placed in this encounter.  Disposition:   F/U with me in 12  months  Signed, Lonni Cash, MD 10/11/2024 8:55 PM    [facility] [address] [place], [place], [place]  [address] Phone: [phone]; Fax: [phone]    "

## 2024-10-12 ENCOUNTER — Encounter: Payer: Self-pay | Admitting: Cardiovascular Disease

## 2024-10-12 ENCOUNTER — Ambulatory Visit: Attending: Cardiology | Admitting: Cardiovascular Disease

## 2024-10-12 VITALS — BP 80/50 | HR 66 | Ht 63.0 in | Wt 130.0 lb

## 2024-10-12 DIAGNOSIS — I340 Nonrheumatic mitral (valve) insufficiency: Secondary | ICD-10-CM

## 2024-10-12 NOTE — Patient Instructions (Signed)
 Medication Instructions:  No Changes *If you need a refill on your cardiac medications before your next appointment, please call your pharmacy*  Lab Work: None  Testing/Procedures: Your physician has requested that you have an echocardiogram to be done in July 2026. Echocardiography is a painless test that uses sound waves to create images of your heart. It provides your doctor with information about the size and shape of your heart and how well your hearts chambers and valves are working. This procedure takes approximately one hour. There are no restrictions for this procedure. Please do NOT wear cologne, perfume, aftershave, or lotions (deodorant is allowed). Please arrive 15 minutes prior to your appointment time.  Please note: We ask at that you not bring children with you during ultrasound (echo/ vascular) testing. Due to room size and safety concerns, children are not allowed in the ultrasound rooms during exams. Our front office staff cannot provide observation of children in our lobby area while testing is being conducted. An adult accompanying a patient to their appointment will only be allowed in the ultrasound room at the discretion of the ultrasound technician under special circumstances. We apologize for any inconvenience.   Follow-Up: At [facility], you and your health needs are our priority.  As part of our continuing mission to provide you with exceptional heart care, our providers are all part of one team.  This team includes your primary Cardiologist (physician) and Advanced Practice Providers or APPs (Physician Assistants and Nurse Practitioners) who all work together to provide you with the care you need, when you need it.  Your next appointment:   1 year(s) (Call in November 2026 for a January 2027 appointment)  Provider:   Lonni Cash, MD

## 2025-04-11 ENCOUNTER — Ambulatory Visit (HOSPITAL_COMMUNITY)

## 2025-04-17 ENCOUNTER — Ambulatory Visit: Admitting: Neurology

## 2025-10-17 ENCOUNTER — Ambulatory Visit: Admitting: Family Medicine
